# Patient Record
Sex: Male | Born: 1955 | Race: White | Hispanic: No | Marital: Married | Smoking: Former smoker
Health system: Southern US, Community
[De-identification: ages and names within clinical notes are randomized; demographics above are authoritative.]

## PROBLEM LIST (undated history)

## (undated) DIAGNOSIS — R51 Headache: Secondary | ICD-10-CM

## (undated) DIAGNOSIS — M51369 Other intervertebral disc degeneration, lumbar region without mention of lumbar back pain or lower extremity pain: Secondary | ICD-10-CM

## (undated) DIAGNOSIS — I700 Atherosclerosis of aorta: Secondary | ICD-10-CM

## (undated) DIAGNOSIS — G4733 Obstructive sleep apnea (adult) (pediatric): Secondary | ICD-10-CM

## (undated) DIAGNOSIS — I10 Essential (primary) hypertension: Secondary | ICD-10-CM

## (undated) DIAGNOSIS — E785 Hyperlipidemia, unspecified: Secondary | ICD-10-CM

## (undated) DIAGNOSIS — I5189 Other ill-defined heart diseases: Secondary | ICD-10-CM

## (undated) DIAGNOSIS — K760 Fatty (change of) liver, not elsewhere classified: Secondary | ICD-10-CM

## (undated) DIAGNOSIS — I358 Other nonrheumatic aortic valve disorders: Secondary | ICD-10-CM

## (undated) DIAGNOSIS — K219 Gastro-esophageal reflux disease without esophagitis: Secondary | ICD-10-CM

## (undated) DIAGNOSIS — I2510 Atherosclerotic heart disease of native coronary artery without angina pectoris: Secondary | ICD-10-CM

## (undated) DIAGNOSIS — M5030 Other cervical disc degeneration, unspecified cervical region: Secondary | ICD-10-CM

## (undated) DIAGNOSIS — G4730 Sleep apnea, unspecified: Secondary | ICD-10-CM

## (undated) DIAGNOSIS — K52831 Collagenous colitis: Secondary | ICD-10-CM

## (undated) DIAGNOSIS — Z972 Presence of dental prosthetic device (complete) (partial): Secondary | ICD-10-CM

## (undated) DIAGNOSIS — I219 Acute myocardial infarction, unspecified: Secondary | ICD-10-CM

## (undated) DIAGNOSIS — I639 Cerebral infarction, unspecified: Secondary | ICD-10-CM

## (undated) DIAGNOSIS — E162 Hypoglycemia, unspecified: Secondary | ICD-10-CM

## (undated) DIAGNOSIS — G43909 Migraine, unspecified, not intractable, without status migrainosus: Secondary | ICD-10-CM

## (undated) DIAGNOSIS — G8929 Other chronic pain: Secondary | ICD-10-CM

## (undated) DIAGNOSIS — T7840XA Allergy, unspecified, initial encounter: Secondary | ICD-10-CM

## (undated) DIAGNOSIS — R569 Unspecified convulsions: Secondary | ICD-10-CM

## (undated) DIAGNOSIS — C801 Malignant (primary) neoplasm, unspecified: Secondary | ICD-10-CM

## (undated) DIAGNOSIS — J45909 Unspecified asthma, uncomplicated: Secondary | ICD-10-CM

## (undated) DIAGNOSIS — A0472 Enterocolitis due to Clostridium difficile, not specified as recurrent: Secondary | ICD-10-CM

## (undated) DIAGNOSIS — M5136 Other intervertebral disc degeneration, lumbar region: Secondary | ICD-10-CM

## (undated) DIAGNOSIS — E119 Type 2 diabetes mellitus without complications: Secondary | ICD-10-CM

## (undated) DIAGNOSIS — R519 Headache, unspecified: Secondary | ICD-10-CM

## (undated) HISTORY — PX: OTHER SURGICAL HISTORY: SHX169

## (undated) HISTORY — DX: Acute myocardial infarction, unspecified: I21.9

## (undated) HISTORY — DX: Other cervical disc degeneration, unspecified cervical region: M50.30

## (undated) HISTORY — DX: Essential (primary) hypertension: I10

## (undated) HISTORY — PX: TOE SURGERY: SHX1073

## (undated) HISTORY — DX: Hyperlipidemia, unspecified: E78.5

## (undated) HISTORY — DX: Allergy, unspecified, initial encounter: T78.40XA

## (undated) HISTORY — PX: NECK SURGERY: SHX720

## (undated) HISTORY — DX: Other intervertebral disc degeneration, lumbar region without mention of lumbar back pain or lower extremity pain: M51.369

## (undated) HISTORY — DX: Other chronic pain: G89.29

## (undated) HISTORY — DX: Hypoglycemia, unspecified: E16.2

## (undated) HISTORY — PX: FINGER SURGERY: SHX640

## (undated) HISTORY — PX: APPENDECTOMY: SHX54

## (undated) HISTORY — DX: Type 2 diabetes mellitus without complications: E11.9

## (undated) HISTORY — DX: Other intervertebral disc degeneration, lumbar region: M51.36

## (undated) HISTORY — DX: Other nonrheumatic aortic valve disorders: I35.8

## (undated) HISTORY — PX: KNEE SURGERY: SHX244

## (undated) HISTORY — DX: Enterocolitis due to Clostridium difficile, not specified as recurrent: A04.72

## (undated) HISTORY — PX: CERVICAL FUSION: SHX112

## (undated) HISTORY — PX: BACK SURGERY: SHX140

## (undated) HISTORY — PX: CARDIAC CATHETERIZATION: SHX172

---

## 1998-12-16 ENCOUNTER — Encounter: Payer: Self-pay | Admitting: Neurosurgery

## 1998-12-19 ENCOUNTER — Encounter: Payer: Self-pay | Admitting: Neurosurgery

## 1998-12-19 ENCOUNTER — Observation Stay (HOSPITAL_COMMUNITY): Admission: RE | Admit: 1998-12-19 | Discharge: 1998-12-19 | Payer: Self-pay | Admitting: Neurosurgery

## 1999-01-01 ENCOUNTER — Encounter: Payer: Self-pay | Admitting: Neurosurgery

## 1999-01-01 ENCOUNTER — Ambulatory Visit (HOSPITAL_COMMUNITY): Admission: RE | Admit: 1999-01-01 | Discharge: 1999-01-01 | Payer: Self-pay | Admitting: Neurosurgery

## 1999-01-30 ENCOUNTER — Encounter: Payer: Self-pay | Admitting: Neurosurgery

## 1999-01-30 ENCOUNTER — Ambulatory Visit (HOSPITAL_COMMUNITY): Admission: RE | Admit: 1999-01-30 | Discharge: 1999-01-30 | Payer: Self-pay | Admitting: Neurosurgery

## 1999-02-03 ENCOUNTER — Encounter: Admission: RE | Admit: 1999-02-03 | Discharge: 1999-05-04 | Payer: Self-pay | Admitting: Anesthesiology

## 1999-04-07 ENCOUNTER — Encounter: Payer: Self-pay | Admitting: Neurosurgery

## 1999-04-07 ENCOUNTER — Ambulatory Visit (HOSPITAL_COMMUNITY): Admission: RE | Admit: 1999-04-07 | Discharge: 1999-04-07 | Payer: Self-pay | Admitting: Neurosurgery

## 1999-05-15 ENCOUNTER — Inpatient Hospital Stay (HOSPITAL_COMMUNITY): Admission: RE | Admit: 1999-05-15 | Discharge: 1999-05-18 | Payer: Self-pay | Admitting: Neurosurgery

## 1999-05-15 ENCOUNTER — Encounter: Payer: Self-pay | Admitting: Neurosurgery

## 1999-06-16 ENCOUNTER — Ambulatory Visit (HOSPITAL_COMMUNITY): Admission: RE | Admit: 1999-06-16 | Discharge: 1999-06-16 | Payer: Self-pay | Admitting: Neurosurgery

## 1999-06-16 ENCOUNTER — Encounter: Payer: Self-pay | Admitting: Neurosurgery

## 1999-09-04 ENCOUNTER — Encounter: Admission: RE | Admit: 1999-09-04 | Discharge: 1999-12-03 | Payer: Self-pay | Admitting: Anesthesiology

## 1999-09-04 ENCOUNTER — Encounter: Payer: Self-pay | Admitting: Anesthesiology

## 2005-06-04 ENCOUNTER — Other Ambulatory Visit: Payer: Self-pay

## 2005-06-04 ENCOUNTER — Inpatient Hospital Stay: Payer: Self-pay

## 2006-04-13 ENCOUNTER — Emergency Department: Payer: Self-pay | Admitting: Unknown Physician Specialty

## 2006-09-05 ENCOUNTER — Emergency Department: Payer: Self-pay | Admitting: Emergency Medicine

## 2007-11-15 ENCOUNTER — Ambulatory Visit: Payer: Self-pay | Admitting: Specialist

## 2007-11-21 ENCOUNTER — Ambulatory Visit: Payer: Self-pay | Admitting: Specialist

## 2009-12-19 ENCOUNTER — Emergency Department: Payer: Self-pay | Admitting: Emergency Medicine

## 2010-10-17 ENCOUNTER — Ambulatory Visit: Payer: Self-pay | Admitting: Family Medicine

## 2012-08-01 ENCOUNTER — Ambulatory Visit: Payer: Self-pay | Admitting: Unknown Physician Specialty

## 2012-08-04 LAB — PATHOLOGY REPORT

## 2013-06-28 ENCOUNTER — Ambulatory Visit: Payer: Self-pay | Admitting: Family Medicine

## 2013-07-20 ENCOUNTER — Ambulatory Visit: Payer: Self-pay | Admitting: Pain Medicine

## 2013-10-01 ENCOUNTER — Emergency Department: Payer: Self-pay | Admitting: Emergency Medicine

## 2013-10-01 LAB — CBC
HCT: 42.2 % (ref 40.0–52.0)
HGB: 13.7 g/dL (ref 13.0–18.0)
MCH: 29.2 pg (ref 26.0–34.0)
MCHC: 32.5 g/dL (ref 32.0–36.0)
MCV: 90 fL (ref 80–100)
Platelet: 179 10*3/uL (ref 150–440)
RBC: 4.7 10*6/uL (ref 4.40–5.90)
RDW: 13.9 % (ref 11.5–14.5)
WBC: 6.8 10*3/uL (ref 3.8–10.6)

## 2013-10-01 LAB — COMPREHENSIVE METABOLIC PANEL
Albumin: 3.7 g/dL (ref 3.4–5.0)
Alkaline Phosphatase: 63 U/L
Anion Gap: 3 — ABNORMAL LOW (ref 7–16)
BUN: 12 mg/dL (ref 7–18)
Bilirubin,Total: 0.7 mg/dL (ref 0.2–1.0)
Calcium, Total: 9.5 mg/dL (ref 8.5–10.1)
Chloride: 108 mmol/L — ABNORMAL HIGH (ref 98–107)
Co2: 28 mmol/L (ref 21–32)
Creatinine: 0.94 mg/dL (ref 0.60–1.30)
EGFR (African American): >60
EGFR (Non-African Amer.): >60
Glucose: 105 mg/dL — ABNORMAL HIGH (ref 65–99)
Osmolality: 278 (ref 275–301)
Potassium: 4 mmol/L (ref 3.5–5.1)
SGOT(AST): 17 U/L (ref 15–37)
SGPT (ALT): 21 U/L (ref 12–78)
Sodium: 139 mmol/L (ref 136–145)
Total Protein: 7.1 g/dL (ref 6.4–8.2)

## 2013-10-01 LAB — LIPASE, BLOOD: Lipase: 83 U/L (ref 73–393)

## 2013-10-13 ENCOUNTER — Emergency Department: Payer: Self-pay | Admitting: Emergency Medicine

## 2013-10-13 LAB — CBC WITH DIFFERENTIAL/PLATELET
Basophil #: 0.1 10*3/uL (ref 0.0–0.1)
Basophil %: 1.3 %
Eosinophil #: 0.2 10*3/uL (ref 0.0–0.7)
Eosinophil %: 1.7 %
HCT: 43.4 % (ref 40.0–52.0)
HGB: 14.7 g/dL (ref 13.0–18.0)
Lymphocyte #: 2.5 10*3/uL (ref 1.0–3.6)
Lymphocyte %: 26.9 %
MCH: 30.5 pg (ref 26.0–34.0)
MCHC: 33.8 g/dL (ref 32.0–36.0)
MCV: 90 fL (ref 80–100)
Monocyte #: 0.8 x10 3/mm (ref 0.2–1.0)
Monocyte %: 8.2 %
Neutrophil #: 5.8 10*3/uL (ref 1.4–6.5)
Neutrophil %: 61.9 %
Platelet: 172 10*3/uL (ref 150–440)
RBC: 4.82 10*6/uL (ref 4.40–5.90)
RDW: 13.9 % (ref 11.5–14.5)
WBC: 9.3 10*3/uL (ref 3.8–10.6)

## 2013-10-13 LAB — LIPASE, BLOOD: Lipase: 105 U/L (ref 73–393)

## 2013-10-13 LAB — URINALYSIS, COMPLETE
Bacteria: NONE SEEN
Bilirubin,UR: NEGATIVE
Blood: NEGATIVE
Glucose,UR: NEGATIVE mg/dL (ref 0–75)
Ketone: NEGATIVE
Leukocyte Esterase: NEGATIVE
Nitrite: NEGATIVE
Ph: 6 (ref 4.5–8.0)
Protein: NEGATIVE
RBC,UR: NONE SEEN /HPF (ref 0–5)
Specific Gravity: 1.003 (ref 1.003–1.030)
Squamous Epithelial: NONE SEEN
WBC UR: NONE SEEN /HPF (ref 0–5)

## 2013-10-13 LAB — COMPREHENSIVE METABOLIC PANEL
Albumin: 4 g/dL (ref 3.4–5.0)
Alkaline Phosphatase: 67 U/L
Anion Gap: 6 — ABNORMAL LOW (ref 7–16)
BUN: 13 mg/dL (ref 7–18)
Bilirubin,Total: 0.5 mg/dL (ref 0.2–1.0)
Calcium, Total: 9.2 mg/dL (ref 8.5–10.1)
Chloride: 108 mmol/L — ABNORMAL HIGH (ref 98–107)
Co2: 24 mmol/L (ref 21–32)
Creatinine: 0.94 mg/dL (ref 0.60–1.30)
EGFR (African American): >60
EGFR (Non-African Amer.): >60
Glucose: 121 mg/dL — ABNORMAL HIGH (ref 65–99)
Osmolality: 277 (ref 275–301)
Potassium: 3.8 mmol/L (ref 3.5–5.1)
SGOT(AST): 26 U/L (ref 15–37)
SGPT (ALT): 31 U/L (ref 12–78)
Sodium: 138 mmol/L (ref 136–145)
Total Protein: 7.7 g/dL (ref 6.4–8.2)

## 2013-10-13 LAB — CLOSTRIDIUM DIFFICILE(ARMC)

## 2013-10-18 LAB — STOOL CULTURE

## 2013-11-13 DIAGNOSIS — M4690 Unspecified inflammatory spondylopathy, site unspecified: Secondary | ICD-10-CM | POA: Insufficient documentation

## 2014-02-22 DIAGNOSIS — Z79899 Other long term (current) drug therapy: Secondary | ICD-10-CM | POA: Insufficient documentation

## 2014-02-28 ENCOUNTER — Emergency Department: Payer: Self-pay | Admitting: Emergency Medicine

## 2014-02-28 LAB — URINALYSIS, COMPLETE
Bacteria: NONE SEEN
Bilirubin,UR: NEGATIVE
Blood: NEGATIVE
Glucose,UR: NEGATIVE mg/dL (ref 0–75)
KETONE: NEGATIVE
LEUKOCYTE ESTERASE: NEGATIVE
NITRITE: NEGATIVE
Ph: 5 (ref 4.5–8.0)
Protein: NEGATIVE
RBC,UR: NONE SEEN /HPF (ref 0–5)
SPECIFIC GRAVITY: 1.013 (ref 1.003–1.030)
Squamous Epithelial: NONE SEEN
WBC UR: NONE SEEN /HPF (ref 0–5)

## 2014-02-28 LAB — COMPREHENSIVE METABOLIC PANEL
ALBUMIN: 3.5 g/dL (ref 3.4–5.0)
ANION GAP: 6 — AB (ref 7–16)
Alkaline Phosphatase: 62 U/L
BILIRUBIN TOTAL: 0.7 mg/dL (ref 0.2–1.0)
BUN: 13 mg/dL (ref 7–18)
CALCIUM: 8.9 mg/dL (ref 8.5–10.1)
CREATININE: 0.85 mg/dL (ref 0.60–1.30)
Chloride: 104 mmol/L (ref 98–107)
Co2: 26 mmol/L (ref 21–32)
Glucose: 105 mg/dL — ABNORMAL HIGH (ref 65–99)
Osmolality: 272 (ref 275–301)
Potassium: 4 mmol/L (ref 3.5–5.1)
SGOT(AST): 20 U/L (ref 15–37)
SGPT (ALT): 25 U/L (ref 12–78)
Sodium: 136 mmol/L (ref 136–145)
TOTAL PROTEIN: 7.6 g/dL (ref 6.4–8.2)

## 2014-02-28 LAB — CBC WITH DIFFERENTIAL/PLATELET
BASOS ABS: 0 10*3/uL (ref 0.0–0.1)
Basophil %: 0.5 %
EOS ABS: 0.1 10*3/uL (ref 0.0–0.7)
EOS PCT: 0.9 %
HCT: 42.9 % (ref 40.0–52.0)
HGB: 14.2 g/dL (ref 13.0–18.0)
LYMPHS ABS: 2.6 10*3/uL (ref 1.0–3.6)
LYMPHS PCT: 31.3 %
MCH: 30.4 pg (ref 26.0–34.0)
MCHC: 33 g/dL (ref 32.0–36.0)
MCV: 92 fL (ref 80–100)
Monocyte #: 0.9 x10 3/mm (ref 0.2–1.0)
Monocyte %: 11.4 %
NEUTROS ABS: 4.6 10*3/uL (ref 1.4–6.5)
Neutrophil %: 55.9 %
Platelet: 190 10*3/uL (ref 150–440)
RBC: 4.66 10*6/uL (ref 4.40–5.90)
RDW: 13.6 % (ref 11.5–14.5)
WBC: 8.3 10*3/uL (ref 3.8–10.6)

## 2014-02-28 LAB — LIPASE, BLOOD: Lipase: 152 U/L (ref 73–393)

## 2014-03-01 LAB — CLOSTRIDIUM DIFFICILE(ARMC)

## 2014-03-03 LAB — STOOL CULTURE

## 2014-10-23 DIAGNOSIS — I00 Rheumatic fever without heart involvement: Secondary | ICD-10-CM | POA: Diagnosis not present

## 2014-10-23 DIAGNOSIS — M5136 Other intervertebral disc degeneration, lumbar region: Secondary | ICD-10-CM | POA: Diagnosis not present

## 2014-10-23 DIAGNOSIS — G894 Chronic pain syndrome: Secondary | ICD-10-CM | POA: Diagnosis not present

## 2014-10-23 DIAGNOSIS — J449 Chronic obstructive pulmonary disease, unspecified: Secondary | ICD-10-CM | POA: Diagnosis not present

## 2014-10-23 DIAGNOSIS — E785 Hyperlipidemia, unspecified: Secondary | ICD-10-CM | POA: Diagnosis not present

## 2014-12-18 ENCOUNTER — Emergency Department: Payer: Self-pay | Admitting: Student

## 2014-12-18 DIAGNOSIS — K828 Other specified diseases of gallbladder: Secondary | ICD-10-CM | POA: Diagnosis not present

## 2014-12-18 DIAGNOSIS — K6389 Other specified diseases of intestine: Secondary | ICD-10-CM | POA: Diagnosis not present

## 2014-12-18 DIAGNOSIS — R1110 Vomiting, unspecified: Secondary | ICD-10-CM | POA: Diagnosis not present

## 2014-12-18 DIAGNOSIS — Z720 Tobacco use: Secondary | ICD-10-CM | POA: Diagnosis not present

## 2014-12-18 DIAGNOSIS — K760 Fatty (change of) liver, not elsewhere classified: Secondary | ICD-10-CM | POA: Diagnosis not present

## 2014-12-18 DIAGNOSIS — R1084 Generalized abdominal pain: Secondary | ICD-10-CM | POA: Diagnosis not present

## 2014-12-18 DIAGNOSIS — I10 Essential (primary) hypertension: Secondary | ICD-10-CM | POA: Diagnosis not present

## 2014-12-18 DIAGNOSIS — R197 Diarrhea, unspecified: Secondary | ICD-10-CM | POA: Diagnosis not present

## 2014-12-24 DIAGNOSIS — R197 Diarrhea, unspecified: Secondary | ICD-10-CM | POA: Diagnosis not present

## 2014-12-24 DIAGNOSIS — M549 Dorsalgia, unspecified: Secondary | ICD-10-CM

## 2014-12-24 DIAGNOSIS — K5289 Other specified noninfective gastroenteritis and colitis: Secondary | ICD-10-CM | POA: Diagnosis not present

## 2014-12-24 DIAGNOSIS — G8929 Other chronic pain: Secondary | ICD-10-CM | POA: Insufficient documentation

## 2014-12-24 DIAGNOSIS — R1012 Left upper quadrant pain: Secondary | ICD-10-CM | POA: Diagnosis not present

## 2014-12-25 ENCOUNTER — Other Ambulatory Visit: Payer: Self-pay | Admitting: Unknown Physician Specialty

## 2014-12-25 DIAGNOSIS — R197 Diarrhea, unspecified: Secondary | ICD-10-CM | POA: Diagnosis not present

## 2015-01-15 DIAGNOSIS — Z0000 Encounter for general adult medical examination without abnormal findings: Secondary | ICD-10-CM | POA: Diagnosis not present

## 2015-01-15 DIAGNOSIS — E785 Hyperlipidemia, unspecified: Secondary | ICD-10-CM | POA: Diagnosis not present

## 2015-01-15 DIAGNOSIS — M5136 Other intervertebral disc degeneration, lumbar region: Secondary | ICD-10-CM | POA: Diagnosis not present

## 2015-01-15 DIAGNOSIS — G894 Chronic pain syndrome: Secondary | ICD-10-CM | POA: Diagnosis not present

## 2015-01-15 DIAGNOSIS — I10 Essential (primary) hypertension: Secondary | ICD-10-CM | POA: Diagnosis not present

## 2015-01-24 DIAGNOSIS — K5289 Other specified noninfective gastroenteritis and colitis: Secondary | ICD-10-CM | POA: Diagnosis not present

## 2015-01-24 DIAGNOSIS — R197 Diarrhea, unspecified: Secondary | ICD-10-CM | POA: Diagnosis not present

## 2015-01-24 DIAGNOSIS — K868 Other specified diseases of pancreas: Secondary | ICD-10-CM | POA: Diagnosis not present

## 2015-01-24 DIAGNOSIS — K760 Fatty (change of) liver, not elsewhere classified: Secondary | ICD-10-CM | POA: Diagnosis not present

## 2015-01-28 DIAGNOSIS — K8689 Other specified diseases of pancreas: Secondary | ICD-10-CM | POA: Insufficient documentation

## 2015-02-12 DIAGNOSIS — Z23 Encounter for immunization: Secondary | ICD-10-CM | POA: Diagnosis not present

## 2015-03-12 DIAGNOSIS — Z23 Encounter for immunization: Secondary | ICD-10-CM | POA: Diagnosis not present

## 2015-03-20 DIAGNOSIS — C229 Malignant neoplasm of liver, not specified as primary or secondary: Secondary | ICD-10-CM

## 2015-03-20 DIAGNOSIS — C801 Malignant (primary) neoplasm, unspecified: Secondary | ICD-10-CM

## 2015-03-20 HISTORY — DX: Malignant (primary) neoplasm, unspecified: C80.1

## 2015-03-20 HISTORY — DX: Malignant neoplasm of liver, not specified as primary or secondary: C22.9

## 2015-03-27 ENCOUNTER — Ambulatory Visit: Payer: Medicare Other | Attending: Pain Medicine | Admitting: Pain Medicine

## 2015-03-27 ENCOUNTER — Ambulatory Visit: Payer: Self-pay | Admitting: Pain Medicine

## 2015-03-27 ENCOUNTER — Encounter (INDEPENDENT_AMBULATORY_CARE_PROVIDER_SITE_OTHER): Payer: Self-pay

## 2015-03-27 ENCOUNTER — Encounter: Payer: Self-pay | Admitting: Pain Medicine

## 2015-03-27 VITALS — BP 148/86 | HR 51 | Temp 97.6°F | Resp 16 | Ht 67.0 in | Wt 191.0 lb

## 2015-03-27 DIAGNOSIS — M545 Low back pain: Secondary | ICD-10-CM | POA: Diagnosis present

## 2015-03-27 DIAGNOSIS — M5481 Occipital neuralgia: Secondary | ICD-10-CM | POA: Diagnosis not present

## 2015-03-27 DIAGNOSIS — Z79899 Other long term (current) drug therapy: Secondary | ICD-10-CM | POA: Diagnosis not present

## 2015-03-27 DIAGNOSIS — Z9889 Other specified postprocedural states: Secondary | ICD-10-CM | POA: Diagnosis not present

## 2015-03-27 DIAGNOSIS — M47812 Spondylosis without myelopathy or radiculopathy, cervical region: Secondary | ICD-10-CM | POA: Insufficient documentation

## 2015-03-27 DIAGNOSIS — K769 Liver disease, unspecified: Secondary | ICD-10-CM | POA: Insufficient documentation

## 2015-03-27 DIAGNOSIS — Z981 Arthrodesis status: Secondary | ICD-10-CM

## 2015-03-27 DIAGNOSIS — M5030 Other cervical disc degeneration, unspecified cervical region: Secondary | ICD-10-CM | POA: Diagnosis not present

## 2015-03-27 DIAGNOSIS — M47817 Spondylosis without myelopathy or radiculopathy, lumbosacral region: Secondary | ICD-10-CM | POA: Diagnosis not present

## 2015-03-27 DIAGNOSIS — F1120 Opioid dependence, uncomplicated: Secondary | ICD-10-CM | POA: Diagnosis not present

## 2015-03-27 DIAGNOSIS — M5412 Radiculopathy, cervical region: Secondary | ICD-10-CM | POA: Diagnosis not present

## 2015-03-27 DIAGNOSIS — Z0389 Encounter for observation for other suspected diseases and conditions ruled out: Secondary | ICD-10-CM | POA: Diagnosis not present

## 2015-03-27 DIAGNOSIS — Z5181 Encounter for therapeutic drug level monitoring: Secondary | ICD-10-CM | POA: Diagnosis not present

## 2015-03-27 DIAGNOSIS — M5136 Other intervertebral disc degeneration, lumbar region: Secondary | ICD-10-CM | POA: Diagnosis not present

## 2015-03-27 DIAGNOSIS — M533 Sacrococcygeal disorders, not elsewhere classified: Secondary | ICD-10-CM | POA: Diagnosis not present

## 2015-03-27 DIAGNOSIS — M961 Postlaminectomy syndrome, not elsewhere classified: Secondary | ICD-10-CM | POA: Insufficient documentation

## 2015-03-27 DIAGNOSIS — M542 Cervicalgia: Secondary | ICD-10-CM | POA: Diagnosis present

## 2015-03-27 DIAGNOSIS — M5416 Radiculopathy, lumbar region: Secondary | ICD-10-CM | POA: Diagnosis not present

## 2015-03-27 DIAGNOSIS — M47816 Spondylosis without myelopathy or radiculopathy, lumbar region: Secondary | ICD-10-CM

## 2015-03-27 DIAGNOSIS — G8929 Other chronic pain: Secondary | ICD-10-CM | POA: Diagnosis not present

## 2015-03-27 DIAGNOSIS — M62838 Other muscle spasm: Secondary | ICD-10-CM | POA: Diagnosis not present

## 2015-03-27 NOTE — Progress Notes (Signed)
Subjective:    Patient ID: Edward Schneider., male    DOB: Jul 17, 1956, 59 y.o.   MRN: [id number]  HPI  Patient is 59 year old gentleman who comes to pain management Center at the request of Dr. Jeananne Rama for further evaluation and treatment of pain involving the lower back lower extremity region as well as the neck and upper extremity regions. The patient is status post prior surgical intervention of the cervical region as well as the lumbar region. Patient also has been informed that he has significant liver disease and is under the care of up Dr. Candace Cruise at this time. Patient has undergone prior evaluation and treatment at [facility] including evaluation at [facility]. she states that he developed blisters following the use of fentanyl patches and that he developed severe itching following the use of morphine. patient states that he is tolerated oxycodone acetaminophen very well his been cautioned regarding the acetaminophen and the percocet due to his hepatic condition. we informed patient that we wish to have patient evaluated by dr. Candace Cruise and that we would need approval from dr. Roxy Manns dr. Jeananne Rama to prescribe oxycodone for the patient and that we would eliminate the acetaminophen to minimize the effects of the liver. We also recommended that patient consider medications such as Neurontin which is metabolized in the kidney and that patient undergoes psych evaluation since medications with psychotropic effects may be beneficial in terms of reducing patient's pain as well as treating component of anxiety and depression which appears to be present the patient was understanding and will proceed with recommendations. Pending review of additional records we may consider prescribing medications for treatment of patient's condition and provided be happy permission to do such. The patient was informed of this and was in agreement with our decision.    Review of  Systems  Cardiovascular Unremarkable  Pulmonary Unremarkable  Neurological Mini strokes  Psychological Unremarkable  Gastrointestinal Unremarkable  Genitourinary Unremarkable  Hematological Unremarkable  Endocrine Unremarkable  Rheumatological Unremarkable  Musculoskeletal Unremarkable  Otherr significant history Unremarkable        Objective:   Physical Exam  There was tenderness to palpation of the splenius capitis Cipro talus region of mild to moderate degree. There was well-healed surgical scar the cervical region without increased warmth erythema in the region scar. Palpation of the trapezius levator scapula and rhomboid musculature region was a tends to palpation of moderate degree. There was unremarkable Spurling's maneuver. Palpation of the acromial clavicular glenohumeral joint regions reproduced mild discomfort. Outpatient of the thoracic facet thoracic paraspinal musculature region was a tends to palpation with no crepitus of the thoracic region noted. Palpation over the lumbar paraspinal musculature region lumbar facet region associated with moderate moderate severe discomfort with lateral bending and rotation reproducing moderate severe discomfort. There was severe tenderness to palpation of the PSIS and PII S regions. There was well-healed surgical scar the lumbar region without increased warmth or erythema region scar. DTRs were difficult to elicit patient had difficulty relaxing no definite sensory deficit of dermatomal detected. No severe tenderness to palpation of the abdomen was noted and no costovertebral angle tenderness was noted.      Assessment & Plan:   Degenerative disc disease cervical spine Post surgery cervical region Cervical facet syndrome  Degenerative disc disease lumbar spine Simona Huh post surgery of the lumbar region Lumbar facet syndrome  Sacroiliac joint dysfunction  Bilateral occipital neuralgia  Hepatic  disease   Plan  Continue present medications. As discussed we may consider prescribing Neurontin (gabapentin) and oxycodone pending approval by Dr. Candace Cruise and Dr. Jeananne Rama area we will also need permission to prescribed these medications as well.  F/U PCP for evaliation of  BP and general medical  condition..  F/U gastroenterological evaluation with Dr. Candace Cruise as scheduled this week  F/U surgical evaluation.  F/U neurological evaluation.  May consider radiofrequency rhizolysis or intraspinal procedures pending response to present treatment and F/U evaluation.  Patient to call [facility] Management [facility] should patient have concerns prior to scheduled return appointment.

## 2015-03-27 NOTE — Progress Notes (Signed)
Safety precautions to be maintained throughout the outpatient stay will include: orient to surroundings, keep bed in low position, maintain call bell within reach at all times, provide assistance with transfer out of bed and ambulation.  Patient discharge home, ambulatory at  1347hrs.

## 2015-03-27 NOTE — Patient Instructions (Signed)
Continue present medications. We may consider prescribing Neurontin (gabapentin) and oxycodone for your condition pending approval to prescribe these medications. We will need approval from Dr.Oh and Dr. Jeananne Rama to prescribe these medications for treatment of her condition  F/U PCP for evaliation of  BP and general medical  Condition.  Gastrointestinal and neurological evaluation with Dr. Candace Cruise  this week as planned. Please have Dr. Candace Cruise provide [place] with approval for you to take oxycodone for treatment of your pain involving your neck and upper extremities as well as back and lower extremities area we will need permission from Dr. Candace Cruise and Dr. Jeananne Rama to prescribe oxycodone for treatment of your pain and consideration of your hepatic condition  F/U surgical evaluation.  F/U neurological evaluation  Psych evaluation. We feel that psych evaluation will be beneficial in terms of treatment of your condition since several psych medications can help relieve pain as well as decrease any anxiety or depression which you may have related to your condition  May consider radiofrequency rhizolysis or intraspinal procedures pending response to present treatment and F/U evaluation.  Patient to call [facility] Management Center should patient have concerns prior to scheduled return appointment.

## 2015-03-28 DIAGNOSIS — K5289 Other specified noninfective gastroenteritis and colitis: Secondary | ICD-10-CM | POA: Diagnosis not present

## 2015-03-28 DIAGNOSIS — R1012 Left upper quadrant pain: Secondary | ICD-10-CM | POA: Diagnosis not present

## 2015-03-28 DIAGNOSIS — K868 Other specified diseases of pancreas: Secondary | ICD-10-CM | POA: Diagnosis not present

## 2015-03-28 DIAGNOSIS — K760 Fatty (change of) liver, not elsewhere classified: Secondary | ICD-10-CM | POA: Diagnosis not present

## 2015-04-02 ENCOUNTER — Other Ambulatory Visit: Payer: Self-pay | Admitting: Family Medicine

## 2015-04-04 ENCOUNTER — Other Ambulatory Visit: Payer: Self-pay | Admitting: Pain Medicine

## 2015-04-09 ENCOUNTER — Telehealth: Payer: Self-pay | Admitting: Family Medicine

## 2015-04-09 NOTE — Telephone Encounter (Signed)
Pt called, he is not getting pain meds from Pain Clinic until his next appt.  He would be due today for meds.  Practice Partner number [phone]

## 2015-04-10 MED ORDER — OXYCODONE-ACETAMINOPHEN 5-325 MG PO TABS: 1.0000 | ORAL_TABLET | Freq: Four times a day (QID) | ORAL | Status: DC | PRN

## 2015-04-10 NOTE — Addendum Note (Signed)
Addended byGolden Pop on: 04/10/2015 01:14 PM   Modules accepted: Orders

## 2015-04-16 ENCOUNTER — Ambulatory Visit: Payer: Medicare Other | Attending: Pain Medicine | Admitting: Pain Medicine

## 2015-04-16 ENCOUNTER — Telehealth: Payer: Self-pay | Admitting: Pain Medicine

## 2015-04-16 ENCOUNTER — Telehealth: Payer: Self-pay | Admitting: Family Medicine

## 2015-04-16 ENCOUNTER — Other Ambulatory Visit: Payer: Self-pay | Admitting: Pain Medicine

## 2015-04-16 ENCOUNTER — Encounter: Payer: Self-pay | Admitting: Pain Medicine

## 2015-04-16 VITALS — BP 132/79 | HR 51 | Temp 98.6°F | Resp 16 | Ht 68.0 in | Wt 193.0 lb

## 2015-04-16 DIAGNOSIS — M79605 Pain in left leg: Secondary | ICD-10-CM | POA: Diagnosis present

## 2015-04-16 DIAGNOSIS — Z981 Arthrodesis status: Secondary | ICD-10-CM

## 2015-04-16 DIAGNOSIS — M961 Postlaminectomy syndrome, not elsewhere classified: Secondary | ICD-10-CM

## 2015-04-16 DIAGNOSIS — M47817 Spondylosis without myelopathy or radiculopathy, lumbosacral region: Secondary | ICD-10-CM | POA: Diagnosis not present

## 2015-04-16 DIAGNOSIS — M533 Sacrococcygeal disorders, not elsewhere classified: Secondary | ICD-10-CM | POA: Insufficient documentation

## 2015-04-16 DIAGNOSIS — M5030 Other cervical disc degeneration, unspecified cervical region: Secondary | ICD-10-CM | POA: Insufficient documentation

## 2015-04-16 DIAGNOSIS — M5136 Other intervertebral disc degeneration, lumbar region: Secondary | ICD-10-CM | POA: Diagnosis not present

## 2015-04-16 DIAGNOSIS — M79604 Pain in right leg: Secondary | ICD-10-CM | POA: Diagnosis present

## 2015-04-16 DIAGNOSIS — M5416 Radiculopathy, lumbar region: Secondary | ICD-10-CM | POA: Diagnosis not present

## 2015-04-16 DIAGNOSIS — M62838 Other muscle spasm: Secondary | ICD-10-CM | POA: Diagnosis not present

## 2015-04-16 DIAGNOSIS — M47812 Spondylosis without myelopathy or radiculopathy, cervical region: Secondary | ICD-10-CM

## 2015-04-16 DIAGNOSIS — Z9889 Other specified postprocedural states: Secondary | ICD-10-CM | POA: Diagnosis not present

## 2015-04-16 DIAGNOSIS — M545 Low back pain: Secondary | ICD-10-CM | POA: Diagnosis present

## 2015-04-16 DIAGNOSIS — M5481 Occipital neuralgia: Secondary | ICD-10-CM

## 2015-04-16 DIAGNOSIS — M47816 Spondylosis without myelopathy or radiculopathy, lumbar region: Secondary | ICD-10-CM

## 2015-04-16 DIAGNOSIS — M5412 Radiculopathy, cervical region: Secondary | ICD-10-CM | POA: Diagnosis not present

## 2015-04-16 MED ORDER — GABAPENTIN 300 MG PO CAPS: ORAL_CAPSULE | ORAL | Status: DC

## 2015-04-16 MED ORDER — TOPIRAMATE 25 MG PO TABS: ORAL_TABLET | ORAL | Status: DC

## 2015-04-16 MED ORDER — METANX 3-35-2 MG PO TABS: ORAL_TABLET | ORAL | Status: DC

## 2015-04-16 NOTE — Telephone Encounter (Signed)
Pt called in says pain clinic gave the ok to complete the refill and says that if we need to verify call [phone]

## 2015-04-16 NOTE — Progress Notes (Signed)
Safety precautions to be maintained throughout the outpatient stay will include: orient to surroundings, keep bed in low position, maintain call bell within reach at all times, provide assistance with transfer out of bed and ambulation.  

## 2015-04-16 NOTE — Patient Instructions (Addendum)
Continue present medications and begin Metanx as discussed  Lumbar facet, medial branch nerve, blocks to be performed at time of return appointment 05/01/2015  F/U PCP, Dr. Jeananne Rama, for evaliation of  BP, hepatic condition, and general medical  Condition.  Psych evaluation. Please obtain date for psych evaluation as discussed  F/U surgical evaluation.  F/U neurological evaluation.  F/U Dr. Candace Cruise as discussed  May consider radiofrequency rhizolysis or intraspinal procedures pending response to present treatment and F/U evaluation.  Patient to call [facility] Management [facility] should patient have concerns prior to scheduled return appointment. Facet Blocks Patient Information  Description: The facets are joints in the spine between the vertebrae.  Like any joints in the body, facets can become irritated and painful.  Arthritis can also effect the facets.  By injecting steroids and local anesthetic in and around these joints, we can temporarily block the nerve supply to them.  Steroids act directly on irritated nerves and tissues to reduce selling and inflammation which often leads to decreased pain.  Facet blocks may be done anywhere along the spine from the neck to the low back depending upon the location of your pain.   After numbing the skin with local anesthetic (like Novocaine), a small needle is passed onto the facet joints under x-ray guidance.  You may experience a sensation of pressure while this is being done.  The entire block usually lasts about 15-25 minutes.   Conditions which may be treated by facet blocks:   Low back/buttock pain  Neck/shoulder pain  Certain types of headaches  Preparation for the injection:  1. Do not eat any solid food or dairy products within 6 hours of your appointment. 2. You may drink clear liquid up to 2 hours before appointment.  Clear liquids include water, black coffee, juice or soda.  No milk or cream please. 3. You may take your regular  medication, including pain medications, with a sip of water before your appointment.  Diabetics should hold regular insulin (if taken separately) and take 1/2 normal NPH dose the morning of the procedure.  Carry some sugar containing items with you to your appointment. 4. A driver must accompany you and be prepared to drive you home after your procedure. 5. Bring all your current medications with you. 6. An IV may be inserted and sedation may be given at the discretion of the physician. 7. A blood pressure cuff, EKG and other monitors will often be applied during the procedure.  Some patients may need to have extra oxygen administered for a short period. 8. You will be asked to provide medical information, including your allergies and medications, prior to the procedure.  We must know immediately if you are taking blood thinners (like Coumadin/Warfarin) or if you are allergic to IV iodine contrast (dye).  We must know if you could possible be pregnant.  Possible side-effects:   Bleeding from needle site  Infection (rare, may require surgery)  Nerve injury (rare)  Numbness & tingling (temporary)  Difficulty urinating (rare, temporary)  Spinal headache (a headache worse with upright posture)  Light-headedness (temporary)  Pain at injection site (serveral days)  Decreased blood pressure (rare, temporary)  Weakness in arm/leg (temporary)  Pressure sensation in back/neck (temporary)   Call if you experience:   Fever/chills associated with headache or increased back/neck pain  Headache worsened by an upright position  New onset, weakness or numbness of an extremity below the injection site  Hives or difficulty breathing (go to the emergency room)  Inflammation or drainage at the injection site(s)  Severe back/neck pain greater than usual  New symptoms which are concerning to you  Please note:  Although the local anesthetic injected can often make your back or neck feel  good for several hours after the injection, the pain will likely return. It takes 3-7 days for steroids to work.  You may not notice any pain relief for at least one week.  If effective, we will often do a series of 2-3 injections spaced 3-6 weeks apart to maximally decrease your pain.  After the initial series, you may be a candidate for a more permanent nerve block of the facets.  If you have any questions, please call #[phone]) [facility]

## 2015-04-16 NOTE — Telephone Encounter (Signed)
Ins will not pay metanx can you prescribe something different

## 2015-04-16 NOTE — Progress Notes (Signed)
   Subjective:    Patient ID: Edward Schneider., male    DOB: December 26, 1955, 59 y.o.   MRN: [id number]  HPI  Patient is 59 year old gentleman who returns to [facility] for further evaluation and treatment of pain involving the lower back and lower extremity regions predominantly. Patient is with history of prior surgical intervention of the lumbar region as well as cervical region. Patient is undergone prior evaluation and treatment at pain clinic at [facility] and [facility]. On today's visit we discussed patient's condition and due to patient's hepatic condition we will avoid certain medications as well as other treatment. We discussed patient's condition patient admits to burning stinging pain of the lower extremities. We will prescribe Metanx at this time. Patient is unable to tolerate Neurontin due to lower extremity swelling as a side effect of the Neurontin. We will consider other medications and will also schedule patient for psych evaluation for treatment of anxiety and depression. We feel that the psych medications will also benefit patient in terms of reducing his pain. We will proceed with interventional treatment consisting of lumbar facet, medial branch nerve, blocks at time return appointment in attempt to decrease severity of patient's symptoms, minimize progression of symptoms, and hopefully avoid the need for more involved treatment. The patient was understanding and in agreement with suggested treatment plan. The patient will follow-up with Dr. Jeananne Rama and Dr. Candace Cruise as planned     Review of Systems     Objective:   Physical Exam  There was mild tenderness of the splenius capitis and occipitalis musculature regions. There was well-healed scar of the cervical region . There was unremarkable Spurling's maneuver. Palpation of the cervical paraspinal musculature region and thoracic paraspinal musculature regions were with tends to palpation of mild degree.  Patient appeared to be with slightly decreased grip strength. Tinel and Phalen's maneuver were without increase of pain of significant degree. Palpation of the thoracic facet thoracic paraspinal musculature regions reproduced pain of moderate degree. No crepitus of the thoracic region was noted. Palpation of the lumbar paraspinal musculature region lumbar facet region was with tenderness to palpation and there was well-healed surgical scar of the lumbar region . There was severe tenderness to palpation of the lumbar facet lumbar paraspinal musculature region with rotation and palpation of the lumbar facets reproducing severe pain. Palpation of the PSIS and PII S region was associated with moderate discomfort and there was mild tenderness of the greater trochanteric region. Straight leg raising limited to approximately 20 without an increase of pain with dorsiflexion noted. Abdomen was nontender and no costovertebral angle tenderness was noted.     Assessment & Plan:  DDD lumbar Status post lumbar surgery  Lumbar facet syndrome  Sacroiliac joint dysfunction  DDD cervical Status post cervical surgery    Plan   Continue present medications and begin Metanx  . Patient unable to tolerate Neurontin   Lumbar facet, medial branch nerve, blocks to be performed at time return appointment  F/U PCP Dr. Jeananne Rama for evaliation of  BP and general medical  condition.  F/U surgical evaluation.  F/U neurological evaluation.  F/U Dr.Oh as planned  May consider radiofrequency rhizolysis or intraspinal procedures pending response to present treatment and F/U evaluation.  Patient to call [facility] should patient have concerns prior to scheduled return appointment.

## 2015-04-17 NOTE — Telephone Encounter (Signed)
Topamax was e-scribed. Patient notified by voice mail on 04-16-15

## 2015-04-19 ENCOUNTER — Telehealth: Payer: Self-pay | Admitting: Family Medicine

## 2015-04-19 NOTE — Telephone Encounter (Signed)
Unsure of what to do.  Can you please look into this request.?

## 2015-04-19 NOTE — Telephone Encounter (Signed)
Pt called stated he needs another RX for percocet. Stated he only received an RX for 64 pills and usually gets 112. Pt stated he will be out of the medication before Dr. Jeananne Rama returns to the clinic. Pt stated said the RX he received will only last him through 04/26/15. Pt stated Malachy Mood has filled it before, wants to know if someone else can write the RX. Pt stated all we need to do is contact the pain clinic Dr. Primus Bravo @ [phone]. Please contact on his cell phone with any questions or concerns.

## 2015-04-22 ENCOUNTER — Emergency Department: Payer: Medicare Other

## 2015-04-22 ENCOUNTER — Encounter: Payer: Self-pay | Admitting: *Deleted

## 2015-04-22 ENCOUNTER — Emergency Department
Admission: EM | Admit: 2015-04-22 | Discharge: 2015-04-23 | Disposition: A | Discharge status: Home / Self Care | Payer: Medicare Other | Attending: Emergency Medicine | Admitting: Emergency Medicine

## 2015-04-22 ENCOUNTER — Other Ambulatory Visit: Payer: Self-pay

## 2015-04-22 DIAGNOSIS — Z720 Tobacco use: Secondary | ICD-10-CM | POA: Insufficient documentation

## 2015-04-22 DIAGNOSIS — R109 Unspecified abdominal pain: Secondary | ICD-10-CM

## 2015-04-22 DIAGNOSIS — Z79899 Other long term (current) drug therapy: Secondary | ICD-10-CM | POA: Insufficient documentation

## 2015-04-22 DIAGNOSIS — F419 Anxiety disorder, unspecified: Secondary | ICD-10-CM | POA: Diagnosis not present

## 2015-04-22 DIAGNOSIS — R197 Diarrhea, unspecified: Secondary | ICD-10-CM | POA: Diagnosis not present

## 2015-04-22 DIAGNOSIS — R1032 Left lower quadrant pain: Secondary | ICD-10-CM | POA: Diagnosis not present

## 2015-04-22 DIAGNOSIS — R1110 Vomiting, unspecified: Secondary | ICD-10-CM | POA: Diagnosis not present

## 2015-04-22 DIAGNOSIS — G8929 Other chronic pain: Secondary | ICD-10-CM | POA: Insufficient documentation

## 2015-04-22 DIAGNOSIS — R1084 Generalized abdominal pain: Secondary | ICD-10-CM

## 2015-04-22 DIAGNOSIS — C229 Malignant neoplasm of liver, not specified as primary or secondary: Secondary | ICD-10-CM | POA: Diagnosis not present

## 2015-04-22 HISTORY — DX: Unspecified asthma, uncomplicated: J45.909

## 2015-04-22 HISTORY — DX: Malignant (primary) neoplasm, unspecified: C80.1

## 2015-04-22 LAB — CBC WITH DIFFERENTIAL/PLATELET
BASOS PCT: 1 %
Basophils Absolute: 0.1 10*3/uL (ref 0–0.1)
EOS ABS: 0.3 10*3/uL (ref 0–0.7)
EOS PCT: 3 %
HCT: 40.8 % (ref 40.0–52.0)
Hemoglobin: 13.7 g/dL (ref 13.0–18.0)
Lymphocytes Relative: 44 %
Lymphs Abs: 3.6 10*3/uL (ref 1.0–3.6)
MCH: 30.7 pg (ref 26.0–34.0)
MCHC: 33.7 g/dL (ref 32.0–36.0)
MCV: 91 fL (ref 80.0–100.0)
MONOS PCT: 9 %
Monocytes Absolute: 0.8 10*3/uL (ref 0.2–1.0)
NEUTROS ABS: 3.6 10*3/uL (ref 1.4–6.5)
NEUTROS PCT: 43 %
Platelets: 167 10*3/uL (ref 150–440)
RBC: 4.48 MIL/uL (ref 4.40–5.90)
RDW: 13.6 % (ref 11.5–14.5)
WBC: 8.3 10*3/uL (ref 3.8–10.6)

## 2015-04-22 LAB — URINALYSIS COMPLETE WITH MICROSCOPIC (ARMC ONLY)
BILIRUBIN URINE: NEGATIVE
Bacteria, UA: NONE SEEN
Glucose, UA: NEGATIVE mg/dL
HGB URINE DIPSTICK: NEGATIVE
KETONES UR: NEGATIVE mg/dL
Leukocytes, UA: NEGATIVE
Nitrite: NEGATIVE
PROTEIN: NEGATIVE mg/dL
Specific Gravity, Urine: 1.008 (ref 1.005–1.030)
Squamous Epithelial / LPF: NONE SEEN
pH: 7 (ref 5.0–8.0)

## 2015-04-22 LAB — COMPREHENSIVE METABOLIC PANEL
ALT: 20 U/L (ref 17–63)
AST: 25 U/L (ref 15–41)
Albumin: 4.1 g/dL (ref 3.5–5.0)
Alkaline Phosphatase: 63 U/L (ref 38–126)
Anion gap: 10 (ref 5–15)
BUN: 11 mg/dL (ref 6–20)
CALCIUM: 9.6 mg/dL (ref 8.9–10.3)
CO2: 27 mmol/L (ref 22–32)
Chloride: 103 mmol/L (ref 101–111)
Creatinine, Ser: 0.81 mg/dL (ref 0.61–1.24)
GLUCOSE: 120 mg/dL — AB (ref 65–99)
Potassium: 4 mmol/L (ref 3.5–5.1)
SODIUM: 140 mmol/L (ref 135–145)
Total Bilirubin: 0.5 mg/dL (ref 0.3–1.2)
Total Protein: 7 g/dL (ref 6.5–8.1)

## 2015-04-22 LAB — LIPASE, BLOOD: Lipase: 35 U/L (ref 22–51)

## 2015-04-22 LAB — TROPONIN I: Troponin I: <0.03 ng/mL (ref <0.031)

## 2015-04-22 MED ORDER — HYDROMORPHONE HCL 1 MG/ML IJ SOLN
INTRAMUSCULAR | Status: AC
  Administered 2015-04-22: 1 mg via INTRAVENOUS
  Filled 2015-04-22: qty 1

## 2015-04-22 MED ORDER — HYDROMORPHONE HCL 1 MG/ML IJ SOLN
1.0000 mg | Freq: Once | INTRAMUSCULAR | Status: AC
  Administered 2015-04-22: 1 mg via INTRAVENOUS

## 2015-04-22 MED ORDER — ONDANSETRON HCL 4 MG/2ML IJ SOLN
INTRAMUSCULAR | Status: AC
  Administered 2015-04-22: 4 mg via INTRAVENOUS
  Filled 2015-04-22: qty 2

## 2015-04-22 MED ORDER — SODIUM CHLORIDE 0.9 % IV BOLUS (SEPSIS)
1000.0000 mL | Freq: Once | INTRAVENOUS | Status: AC
  Administered 2015-04-22: 1000 mL via INTRAVENOUS

## 2015-04-22 MED ORDER — IOHEXOL 350 MG/ML SOLN
100.0000 mL | Freq: Once | INTRAVENOUS | Status: AC | PRN
  Administered 2015-04-22: 100 mL via INTRAVENOUS

## 2015-04-22 MED ORDER — ONDANSETRON HCL 4 MG/2ML IJ SOLN
4.0000 mg | Freq: Once | INTRAMUSCULAR | Status: AC
  Administered 2015-04-22: 4 mg via INTRAVENOUS

## 2015-04-22 NOTE — ED Notes (Signed)
Pt to triage via wheelchair with eyes closed.  Pt has abd pain for 3 years.  States pain is worse since this morning.  Pt has vomiting x3.  Diarrhea x 5 today.  Pt states recent dx of stomach cancer.

## 2015-04-22 NOTE — ED Provider Notes (Signed)
[facility] Emergency Department Provider Note  ____________________________________________  Time seen: 10:00 PM  I have reviewed the triage vital signs and the nursing notes.   HISTORY  Chief Complaint Abdominal Pain    HPI Edward Schneider. is a 59 y.o. male who complains of severe abdominal pain for one week. It is particularly bad today and associated with vomiting and diarrhea today. He reports is constant and always there although it does get severely worse whenever he eats. Pain is nonradiating. No chest pain or shortness of breath. No fever or chills. No blood in diarrhea or emesis     Past Medical History  Diagnosis Date  . Liver disease   . C. difficile diarrhea   . Asthma   . Cancer June 2016    liver cancer    Patient Active Problem List   Diagnosis Date Noted  . DDD (degenerative disc disease), cervical 03/27/2015  . Status post cervical spinal fusion 03/27/2015  . DDD (degenerative disc disease), lumbar 03/27/2015  . Cervical post-laminectomy syndrome 03/27/2015  . Cervical facet syndrome 03/27/2015  . Facet syndrome, lumbar 03/27/2015  . Sacroiliac joint dysfunction 03/27/2015  . Bilateral occipital neuralgia 03/27/2015    Past Surgical History  Procedure Laterality Date  . Neck surgery    . Back surgery    . Appendectomy    . Spleen surg    . Spleen surgery    . Knee surgery Right     Current Outpatient Rx  Name  Route  Sig  Dispense  Refill  . oxyCODONE-acetaminophen (ROXICET) 5-325 MG per tablet   Oral   Take 1 tablet by mouth every 6 (six) hours as needed for severe pain.   64 tablet   0   . topiramate (TOPAMAX) 25 MG tablet      Limit one tablet by mouth per day or twice per day if tolerated   50 tablet   0   . vitamin A 7500 UNIT capsule   Oral   Take 2,000 Units by mouth daily.         . vitamin B-12 (CYANOCOBALAMIN) 100 MCG tablet   Oral   Take 100 mcg by mouth daily.         . vitamin C  (ASCORBIC ACID) 250 MG tablet   Oral   Take 250 mg by mouth daily.         Marland Kitchen EPINEPHrine (EPIPEN IJ)   Injection   Inject as directed as needed.         . gabapentin (NEURONTIN) 300 MG capsule      Limit 1 capsule by mouth for 3 days then increase to 1 capsule by mouth twice per day if tolerated   50 capsule   0   . multivitamin (METANX) 3-35-2 MG TABS tablet      Limit 1-2 tabs by mouth per day if tolerated   60 tablet   0     Allergies Shrimp; Fentanyl; and Morphine and related  Family History  Problem Relation Age of Onset  . Arthritis Mother   . Diabetes Mother   . Kidney disease Mother   . Arthritis Father   . Hearing loss Father   . Heart disease Father   . Hypertension Father     Social History History  Substance Use Topics  . Smoking status: Current Every Day Smoker -- 1.00 packs/day    Types: Cigarettes  . Smokeless tobacco: Not on file  . Alcohol Use:  No    Review of Systems  Constitutional: No fever or chills. No weight changes Eyes:No blurry vision or double vision.  ENT: No sore throat. Cardiovascular: No chest pain. Respiratory: No dyspnea or cough. Gastrointestinal: Abdominal pain vomiting and diarrhea as above.  No BRBPR or melena. Genitourinary: Negative for dysuria, urinary retention, bloody urine, or difficulty urinating. Musculoskeletal: Negative for back pain. No joint swelling or pain. Skin: Negative for rash. Neurological: Negative for headaches, focal weakness or numbness. Psychiatric:Positive anxiety.   Endocrine:No hot/cold intolerance, changes in energy, or sleep difficulty.  10-point ROS otherwise negative.  ____________________________________________   PHYSICAL EXAM:  VITAL SIGNS: ED Triage Vitals  Enc Vitals Group     BP 04/22/15 2112 147/76 mmHg     Pulse Rate 04/22/15 2112 62     Resp 04/22/15 2112 20     Temp 04/22/15 2112 98.1 F (36.7 C)     Temp Source 04/22/15 2112 Oral     SpO2 04/22/15 2112 97 %      Weight 04/22/15 2112 191 lb (86.637 kg)     Height 04/22/15 2112 5\' 8"  (1.727 m)     Head Cir --      Peak Flow --      Pain Score 04/22/15 2113 10     Pain Loc --      Pain Edu? --      Excl. in [place]? --      Constitutional: Alert and oriented. Moderate distress, anxious. Eyes: No scleral icterus. No conjunctival pallor. PERRL. EOMI ENT   Head: Normocephalic and atraumatic.   Nose: No congestion/rhinnorhea. No septal hematoma   Mouth/Throat: MMM, no pharyngeal erythema. No peritonsillar mass. No uvula shift.   Neck: No stridor. No SubQ emphysema. No meningismus. Hematological/Lymphatic/Immunilogical: No cervical lymphadenopathy. Cardiovascular: RRR. Normal and symmetric distal pulses are present in all extremities. No murmurs, rubs, or gallops. Respiratory: Normal respiratory effort without tachypnea nor retractions. Breath sounds are clear and equal bilaterally. No wheezes/rales/rhonchi. Gastrointestinal: Soft with generalized tenderness. No distention. There is no CVA tenderness.  No rebound, rigidity, or guarding. Genitourinary: deferred Musculoskeletal: Nontender with normal range of motion in all extremities. No joint effusions.  No lower extremity tenderness.  No edema. Neurologic:   Normal speech and language.  CN 2-10 normal. Motor grossly intact. No pronator drift.  Normal gait. No gross focal neurologic deficits are appreciated.  Skin:  Skin is warm, dry and intact. No rash noted.  No petechiae, purpura, or bullae. Psychiatric: Mood and affect are normal. Speech and behavior are normal. Patient exhibits appropriate insight and judgment.  ____________________________________________    LABS (pertinent positives/negatives) (all labs ordered are listed, but only abnormal results are displayed) Labs Reviewed  COMPREHENSIVE METABOLIC PANEL - Abnormal; Notable for the following:    Glucose, Bld 120 (*)    All other components within normal limits   URINALYSIS COMPLETEWITH MICROSCOPIC ([facility] ONLY) - Abnormal; Notable for the following:    Color, Urine STRAW (*)    APPearance CLEAR (*)    All other components within normal limits  CBC WITH DIFFERENTIAL/PLATELET  LIPASE, BLOOD  TROPONIN I   ____________________________________________   EKG  Interpreted by me  Date: 04/22/2015  Rate: 57  Rhythm: normal sinus rhythm  QRS Axis: normal  Intervals: normal  ST/T Wave abnormalities: normal  Conduction Disutrbances: none  Narrative Interpretation: unremarkable      ____________________________________________    RADIOLOGY  CT angiography abdomen pending Ultrasound gallbladder pending  ____________________________________________   PROCEDURES  ____________________________________________   INITIAL IMPRESSION / ASSESSMENT AND PLAN / ED COURSE  Pertinent labs & imaging results that were available during my care of the patient were reviewed by me and considered in my medical decision making (see chart for details).  Patient presents with severe colicky abdominal pain after eating. This appears to be out of proportion to exam. We will proceed with a CT angiogram of the abdominal vasculature as well as an ultrasound of the gallbladder to look for cholecystitis. Patient is given IV fluids Zofran and Dilaudid for symptom relief at this time. Care of the patient is signed out to Dr. Owens Shark pending radiology studies.  ____________________________________________   FINAL CLINICAL IMPRESSION(S) / ED DIAGNOSES  Final diagnoses:  Abdominal pain, left lower quadrant  Vomiting and diarrhea      Carrie Mew, MD 04/22/15 2351

## 2015-04-22 NOTE — ED Notes (Signed)
Main lab, spoke to [facility], for ad on troponin.

## 2015-04-23 ENCOUNTER — Telehealth: Payer: Self-pay | Admitting: Pain Medicine

## 2015-04-23 MED ORDER — KETOROLAC TROMETHAMINE 10 MG PO TABS
ORAL_TABLET | ORAL | Status: AC
  Administered 2015-04-23: 10 mg via ORAL
  Filled 2015-04-23: qty 1

## 2015-04-23 MED ORDER — KETOROLAC TROMETHAMINE 10 MG PO TABS
10.0000 mg | ORAL_TABLET | Freq: Once | ORAL | Status: AC
  Administered 2015-04-23: 10 mg via ORAL

## 2015-04-23 NOTE — ED Provider Notes (Signed)
I assumed care of the patient at 11:00 PM Dr. Joni Fears. Patient's CAT scan of the abdomen and pelvis as well as ultrasound was read as no acute pathology noted by the radiologist. I evaluated the patient who informed me that he ran out of his Percocet's and was informed by Dr. Primus Bravo that he would not be given another prescription until one week from today. I informed the patient that he would need to call Dr. Primus Bravo office in the morning but that I would not write a additional narcotic discussion from the emergency department at this time.  Gregor Hams, MD 04/23/15 [phone]

## 2015-04-23 NOTE — Discharge Instructions (Signed)

## 2015-04-23 NOTE — Telephone Encounter (Signed)
Edward Schneider from Dr. Rance Muir office left vmail on Friday April 19, 2015 at 4:28 Dr Jeananne Rama is on vacation, they want to clarify that Dr. Primus Bravo wants them to fill medications Please call and ask for Edward Schneider or Edward Schneider

## 2015-04-25 DIAGNOSIS — T782XXS Anaphylactic shock, unspecified, sequela: Secondary | ICD-10-CM

## 2015-04-25 DIAGNOSIS — J449 Chronic obstructive pulmonary disease, unspecified: Secondary | ICD-10-CM | POA: Insufficient documentation

## 2015-04-25 DIAGNOSIS — I10 Essential (primary) hypertension: Secondary | ICD-10-CM | POA: Insufficient documentation

## 2015-04-25 DIAGNOSIS — T782XXA Anaphylactic shock, unspecified, initial encounter: Secondary | ICD-10-CM | POA: Insufficient documentation

## 2015-04-25 DIAGNOSIS — E1159 Type 2 diabetes mellitus with other circulatory complications: Secondary | ICD-10-CM | POA: Insufficient documentation

## 2015-04-25 DIAGNOSIS — J439 Emphysema, unspecified: Secondary | ICD-10-CM | POA: Insufficient documentation

## 2015-04-25 DIAGNOSIS — E785 Hyperlipidemia, unspecified: Secondary | ICD-10-CM

## 2015-04-25 MED ORDER — OXYCODONE-ACETAMINOPHEN 5-325 MG PO TABS: 1.0000 | ORAL_TABLET | Freq: Four times a day (QID) | ORAL | Status: DC | PRN

## 2015-04-25 NOTE — Telephone Encounter (Signed)
Pt called stated he runs out of his medication today. Wants to know if more can be called in. Pt stated Dr. Jeananne Rama only gave him 50 pills to last until he goes to the pain clinic. Please follow up with Dr. Wynetta Emery and pain clinic.

## 2015-04-25 NOTE — Telephone Encounter (Signed)
Tiffany from dr Rance Muir office called to check on status of msg, i informed her we did not give medications to patient and they would receive a request from dr crisp before he took over med, and would they cover patient until dr crisp did take over meds.

## 2015-04-25 NOTE — Telephone Encounter (Signed)
No returned phone call from the pain center. Per records patient went to ER on 04/22/15 and states that he ran out of his medication early. I am not comfortable doing this. Forwarded to [facility] for FYI.

## 2015-04-25 NOTE — Telephone Encounter (Signed)
Patient notified, he will pick it up tomorrow.

## 2015-04-25 NOTE — Telephone Encounter (Signed)
Called and spoke with Juliann Pulse at [facility] pain clinic. She states that Dr.Crisp does not take over patients pain medication prescripts for at the least the third visit. They wanted to know if we could give him 1-2 more refills until this is actually taken over by Dr.Crisp.

## 2015-04-25 NOTE — Telephone Encounter (Signed)
Will give him a refill. OK for him to come pick it up.

## 2015-04-26 NOTE — Telephone Encounter (Signed)
I agree with your response to Edward Schneider at Dr Rance Muir office.Request that Dr Jeananne Rama countnue to prescribe medications for now

## 2015-04-29 ENCOUNTER — Ambulatory Visit (INDEPENDENT_AMBULATORY_CARE_PROVIDER_SITE_OTHER): Payer: Medicare Other | Admitting: Family Medicine

## 2015-04-29 ENCOUNTER — Encounter: Payer: Self-pay | Admitting: Family Medicine

## 2015-04-29 VITALS — BP 126/76 | HR 51 | Temp 98.6°F | Ht 68.0 in | Wt 204.0 lb

## 2015-04-29 DIAGNOSIS — M5136 Other intervertebral disc degeneration, lumbar region: Secondary | ICD-10-CM | POA: Diagnosis not present

## 2015-04-29 DIAGNOSIS — M5030 Other cervical disc degeneration, unspecified cervical region: Secondary | ICD-10-CM | POA: Diagnosis not present

## 2015-04-29 DIAGNOSIS — I10 Essential (primary) hypertension: Secondary | ICD-10-CM

## 2015-04-29 MED ORDER — EPINEPHRINE 0.3 MG/0.3ML IJ SOAJ: 0.3000 mg | Freq: Once | INTRAMUSCULAR | Status: DC

## 2015-04-29 NOTE — Progress Notes (Signed)
BP 126/76 mmHg  Pulse 51  Temp(Src) 98.6 F (37 C)  Ht 5\' 8"  (1.727 m)  Wt 204 lb (92.534 kg)  BMI 31.03 kg/m2  SpO2 99%   Subjective:    Patient ID: Edward Schneider., male    DOB: 1956/06/09, 59 y.o.   MRN: [id number]  HPI: Edward Schneider. is a 59 y.o. male  No chief complaint on file. f/u pain care for back Dr Primus Bravo will be witting pain meds starting in Aug is current plan  Discussed no early refills Dr Wynetta Emery gav rx last week should be last Pt getting nerve block  Breathing trying to stop smoking down from 2 packs a day to 8cigs a day  Relevant past medical, surgical, family and social history reviewed and updated as indicated. Interim medical history since our last visit reviewed. Allergies and medications reviewed and updated.  Review of Systems  Constitutional: Positive for fatigue. Negative for fever.  Respiratory: Negative.   Cardiovascular: Negative.     Per HPI unless specifically indicated above     Objective:    BP 126/76 mmHg  Pulse 51  Temp(Src) 98.6 F (37 C)  Ht 5\' 8"  (1.727 m)  Wt 204 lb (92.534 kg)  BMI 31.03 kg/m2  SpO2 99%  Wt Readings from Last 3 Encounters:  04/29/15 204 lb (92.534 kg)  01/15/15 197 lb (89.359 kg)  04/22/15 191 lb (86.637 kg)    Physical Exam  Constitutional: He is oriented to person, place, and time. He appears well-developed and well-nourished. No distress.  HENT:  Head: Normocephalic and atraumatic.  Right Ear: Hearing normal.  Left Ear: Hearing normal.  Nose: Nose normal.  Eyes: Conjunctivae and lids are normal. Right eye exhibits no discharge. Left eye exhibits no discharge. No scleral icterus.  Cardiovascular: Normal rate and regular rhythm.   Pulmonary/Chest: Effort normal and breath sounds normal. No respiratory distress.  Musculoskeletal:  Limited back ROM  Neurological: He is alert and oriented to person, place, and time.  Skin: Skin is intact. No rash noted.  Psychiatric: He has a normal mood and  affect. His speech is normal and behavior is normal. Judgment and thought content normal. Cognition and memory are normal.    Results for orders placed or performed during the hospital encounter of 04/22/15  CBC WITH DIFFERENTIAL  Result Value Ref Range   WBC 8.3 3.8 - 10.6 K/uL   RBC 4.48 4.40 - 5.90 MIL/uL   Hemoglobin 13.7 13.0 - 18.0 g/dL   HCT 40.8 40.0 - 52.0 %   MCV 91.0 80.0 - 100.0 fL   MCH 30.7 26.0 - 34.0 pg   MCHC 33.7 32.0 - 36.0 g/dL   RDW 13.6 11.5 - 14.5 %   Platelets 167 150 - 440 K/uL   Neutrophils Relative % 43 %   Neutro Abs 3.6 1.4 - 6.5 K/uL   Lymphocytes Relative 44 %   Lymphs Abs 3.6 1.0 - 3.6 K/uL   Monocytes Relative 9 %   Monocytes Absolute 0.8 0.2 - 1.0 K/uL   Eosinophils Relative 3 %   Eosinophils Absolute 0.3 0 - 0.7 K/uL   Basophils Relative 1 %   Basophils Absolute 0.1 0 - 0.1 K/uL  Comprehensive metabolic panel  Result Value Ref Range   Sodium 140 135 - 145 mmol/L   Potassium 4.0 3.5 - 5.1 mmol/L   Chloride 103 101 - 111 mmol/L   CO2 27 22 - 32 mmol/L   Glucose, Bld 120 (  H) 65 - 99 mg/dL   BUN 11 6 - 20 mg/dL   Creatinine, Ser 0.81 0.61 - 1.24 mg/dL   Calcium 9.6 8.9 - 10.3 mg/dL   Total Protein 7.0 6.5 - 8.1 g/dL   Albumin 4.1 3.5 - 5.0 g/dL   AST 25 15 - 41 U/L   ALT 20 17 - 63 U/L   Alkaline Phosphatase 63 38 - 126 U/L   Total Bilirubin 0.5 0.3 - 1.2 mg/dL   GFR calc non Af Amer >60 >60 mL/min   GFR calc Af Amer >60 >60 mL/min   Anion gap 10 5 - 15  Lipase, blood  Result Value Ref Range   Lipase 35 22 - 51 U/L  Urinalysis complete, with microscopic ([facility] only)  Result Value Ref Range   Color, Urine STRAW (A) YELLOW   APPearance CLEAR (A) CLEAR   Glucose, UA NEGATIVE NEGATIVE mg/dL   Bilirubin Urine NEGATIVE NEGATIVE   Ketones, ur NEGATIVE NEGATIVE mg/dL   Specific Gravity, Urine 1.008 1.005 - 1.030   Hgb urine dipstick NEGATIVE NEGATIVE   pH 7.0 5.0 - 8.0   Protein, ur NEGATIVE NEGATIVE mg/dL   Nitrite NEGATIVE NEGATIVE    Leukocytes, UA NEGATIVE NEGATIVE   RBC / HPF 0-5 0 - 5 RBC/hpf   WBC, UA 0-5 0 - 5 WBC/hpf   Bacteria, UA NONE SEEN NONE SEEN   Squamous Epithelial / LPF NONE SEEN NONE SEEN  Troponin I  Result Value Ref Range   Troponin I <0.03 <0.031 ng/mL      Assessment & Plan:   Problem List Items Addressed This Visit      Cardiovascular and Mediastinum   Hypertension - Primary    The current medical regimen is effective;  continue present plan and medications.       Relevant Medications   EPINEPHrine (EPIPEN 2-PAK) 0.3 mg/0.3 mL IJ SOAJ injection     Musculoskeletal and Integument   DDD (degenerative disc disease), cervical    Starting pain clinic this mo      DDD (degenerative disc disease), lumbar    Starting pain clinic this mo          Follow up plan: Return in about 3 months (around 07/30/2015), or if symptoms worsen or fail to improve, for Physical Exam.

## 2015-04-29 NOTE — Assessment & Plan Note (Signed)
The current medical regimen is effective;  continue present plan and medications.  

## 2015-04-29 NOTE — Assessment & Plan Note (Signed)
Starting pain clinic this mo

## 2015-05-01 ENCOUNTER — Encounter: Payer: Self-pay | Admitting: Pain Medicine

## 2015-05-01 ENCOUNTER — Ambulatory Visit: Payer: Medicare Other | Attending: Pain Medicine | Admitting: Pain Medicine

## 2015-05-01 VITALS — BP 115/58 | HR 46 | Temp 98.1°F | Resp 16 | Ht 68.0 in | Wt 201.0 lb

## 2015-05-01 DIAGNOSIS — M5126 Other intervertebral disc displacement, lumbar region: Secondary | ICD-10-CM | POA: Diagnosis not present

## 2015-05-01 DIAGNOSIS — Z7982 Long term (current) use of aspirin: Secondary | ICD-10-CM

## 2015-05-01 DIAGNOSIS — M1288 Other specific arthropathies, not elsewhere classified, other specified site: Secondary | ICD-10-CM | POA: Insufficient documentation

## 2015-05-01 DIAGNOSIS — M47816 Spondylosis without myelopathy or radiculopathy, lumbar region: Secondary | ICD-10-CM | POA: Insufficient documentation

## 2015-05-01 DIAGNOSIS — M79604 Pain in right leg: Secondary | ICD-10-CM | POA: Diagnosis present

## 2015-05-01 DIAGNOSIS — Z9889 Other specified postprocedural states: Secondary | ICD-10-CM | POA: Insufficient documentation

## 2015-05-01 DIAGNOSIS — M79605 Pain in left leg: Secondary | ICD-10-CM | POA: Diagnosis present

## 2015-05-01 DIAGNOSIS — M47817 Spondylosis without myelopathy or radiculopathy, lumbosacral region: Secondary | ICD-10-CM | POA: Diagnosis not present

## 2015-05-01 DIAGNOSIS — M545 Low back pain: Secondary | ICD-10-CM | POA: Diagnosis present

## 2015-05-01 LAB — PLATELET FUNCTION ASSAY
COLLAGEN / ADP: 57 s (ref 0–118)
Collagen / Epinephrine: 171 seconds (ref 0–193)

## 2015-05-01 LAB — APTT: aPTT: 27 seconds (ref 24–36)

## 2015-05-01 LAB — PROTIME-INR
INR: 0.92
Prothrombin Time: 12.6 seconds (ref 11.4–15.0)

## 2015-05-01 MED ORDER — BUPIVACAINE HCL (PF) 0.25 % IJ SOLN
INTRAMUSCULAR | Status: AC
  Administered 2015-05-01: 11:00:00
  Filled 2015-05-01: qty 30

## 2015-05-01 MED ORDER — CEFUROXIME AXETIL 250 MG PO TABS: 250.0000 mg | ORAL_TABLET | Freq: Two times a day (BID) | ORAL | Status: DC

## 2015-05-01 MED ORDER — FENTANYL CITRATE (PF) 100 MCG/2ML IJ SOLN
INTRAMUSCULAR | Status: AC
  Filled 2015-05-01: qty 2

## 2015-05-01 MED ORDER — ORPHENADRINE CITRATE 30 MG/ML IJ SOLN
INTRAMUSCULAR | Status: AC
  Administered 2015-05-01: 11:00:00
  Filled 2015-05-01: qty 2

## 2015-05-01 MED ORDER — TRIAMCINOLONE ACETONIDE 40 MG/ML IJ SUSP
INTRAMUSCULAR | Status: AC
  Administered 2015-05-01: 11:00:00
  Filled 2015-05-01: qty 1

## 2015-05-01 MED ORDER — MIDAZOLAM HCL 5 MG/5ML IJ SOLN
INTRAMUSCULAR | Status: AC
  Administered 2015-05-01: 5 mg via INTRAVENOUS
  Filled 2015-05-01: qty 5

## 2015-05-01 NOTE — Progress Notes (Signed)
Safety precautions to be maintained throughout the outpatient stay will include: orient to surroundings, keep bed in low position, maintain call bell within reach at all times, provide assistance with transfer out of bed and ambulation.  

## 2015-05-01 NOTE — Progress Notes (Signed)
Subjective:    Patient ID: Edward Schneider., male    DOB: [birth year], 59 y.o.   MRN: [id number]  HPI  PROCEDURE PERFORMED: Lumbar facet (medial branch block)   NOTE: The patient is a 59 y.o. male who returns to [facility] for further evaluation and treatment of pain involving the lumbar and lower extremity region. MRI  revealed the patient to be with evidence of multilevel degenerative changes of the lumbar spine with disc protrusion facet arthropathy foraminal narrowing status post lumbar surgery. . There is concern regarding significant component of patient's pain being due to facet syndrome The risks, benefits, and expectations of the procedure have been discussed and explained to the patient who was understanding and in agreement with suggested treatment plan. We will proceed with interventional treatment as discussed and as explained to the patient who was understanding and wished to proceed with procedure as planned.   DESCRIPTION OF PROCEDURE: Lumbar facet (medial branch block) with IV Versed, IV fentanyl conscious sedation, EKG, blood pressure, pulse, and pulse oximetry monitoring. The procedure was performed with the patient in the prone position. Betadine prep of proposed entry site performed.   NEEDLE PLACEMENT AT: Left L 2 lumbar facet (medial branch block). Under fluoroscopic guidance with oblique orientation of 15 degrees, a 22-gauge needle was inserted at the L 2 vertebral body level with needle placed at the targeted area of Burton's Eye or Eye of the Scotty Dog with documentation of needle placement in the superior and lateral border of targeted area of Burton's Eye or Eye of the Scotty Dog with oblique orientation of 15 degrees. Following documentation of needle placement at the L 2 vertebral body level, needle placement was then accomplished at the L 3 vertebral body level.   NEEDLE PLACEMENT AT the L3 and L4 VERTEBRAL BODY LEVELS ON THE LEFT SIDE The procedure was  performed at the L3 and L4 vertebral body levels exactly as was performed at the L 3 vertebral body level utilizing the same technique and under fluoroscopic guidance.  NEEDLE PLACEMENT AT THE SACRAL ALA with AP view of the lumbosacral spine. With the patient in the prone position, Betadine prep of proposed entry site accomplished, a 22 gauge needle was inserted in the region of the sacral ala (groove formed by the superior articulating process of S1 and the sacral wing). Following documentation of needle placement at the sacral ala,  needle placement was then accomplished at the S1 foramen level.   NEEDLE PLACEMENT AT THE S1 FORAMEN LEVEL under fluoroscopic guidance with AP view of the lumbosacral spine and cephalad orientation of the fluoroscope, a 22-gauge needle was placed at the superior and lateral border of the S1 foramen under fluoroscopic guidance. Following documentation of needle placement at the S1 foramen.   Needle placement was then verified at all levels on lateral view. Following documentation of needle placement at all levels on lateral view and following negative aspiration for heme and CSF, each level was injected with 1 mL of 0.25% bupivacaine with Kenalog.     LUMBAR FACET, MEDIAL BRANCH NERVE, BLOCKS PERFORMED ON THE RIGHT SIDE   The procedure was performed on the right side exactly as was performed on the left side at the same levels and utilizing the same technique under fluoroscopic guidance.     The patient tolerated the procedure well. A total of 40 mg of Kenalog was utilized for the procedure.   PLAN:  1. Medications: The patient will continue presently prescribed  medication oxycodone. 2. May consider modification of treatment regimen at time of return appointment pending response to treatment rendered on today's visit. 3. The patient is to follow-up with primary care physician Dr. Jeananne Rama for further evaluation of blood pressure and general medical condition  status post steroid injection performed on today's visit. 4. Surgical follow-up evaluation. We have discussed neurosurgical reevaluation. 5. Neurological follow-up evaluation.. Patient may be considered for PNCV/EMG studies 6. The patient may be candidate for radiofrequency procedures, implantation type procedures, and other treatment pending response to treatment and follow-up evaluation. 7. The patient has been advised to call the Pain Management Center prior to scheduled return appointment should there be significant change in condition or should patient have other concerns regarding condition prior to scheduled return appointment.  The patient is understanding and in agreement with suggested treatment plan.     Review of Systems     Objective:   Physical Exam        Assessment & Plan:

## 2015-05-01 NOTE — Patient Instructions (Addendum)
Continue present medications and antibiotics. Please obtain your antibiotic Ceftin today and begin taking antibiotic today  F/U PCP for evaliation of  BP and general medical  condition.  F/U surgical evaluation as discussed  F/U neurological evaluation.  May consider radiofrequency rhizolysis or intraspinal procedures pending response to present treatment and F/U evaluation.  Patient to call Pain Management Center should patient have concerns prior to scheduled return appointment.  Pain Management Discharge Instructions  General Discharge Instructions :  If you need to reach your doctor call: Monday-Friday 8:00 am - 4:00 pm at [phone] or toll free [phone].  After clinic hours [phone] to have operator reach doctor.  Bring all of your medication bottles to all your appointments in the pain clinic.  To cancel or reschedule your appointment with Pain Management please remember to call 24 hours in advance to avoid a fee.  Refer to the educational materials which you have been given on: General Risks, I had my Procedure. Discharge Instructions, Post Sedation.  Post Procedure Instructions:  The drugs you were given will stay in your system until tomorrow, so for the next 24 hours you should not drive, make any legal decisions or drink any alcoholic beverages.  You may eat anything you prefer, but it is better to start with liquids then soups and crackers, and gradually work up to solid foods.  Please notify your doctor immediately if you have any unusual bleeding, trouble breathing or pain that is not related to your normal pain.  Depending on the type of procedure that was done, some parts of your body may feel week and/or numb.  This usually clears up by tonight or the next day.  Walk with the use of an assistive device or accompanied by an adult for the 24 hours.  You may use ice on the affected area for the first 24 hours.  Put ice in a Ziploc bag and cover with a  towel and place against area 15 minutes on 15 minutes off.  You may switch to heat after 24 hours.GENERAL RISKS AND COMPLICATIONS  What are the risk, side effects and possible complications? Generally speaking, most procedures are safe.  However, with any procedure there are risks, side effects, and the possibility of complications.  The risks and complications are dependent upon the sites that are lesioned, or the type of nerve block to be performed.  The closer the procedure is to the spine, the more serious the risks are.  Great care is taken when placing the radio frequency needles, block needles or lesioning probes, but sometimes complications can occur. 1. Infection: Any time there is an injection through the skin, there is a risk of infection.  This is why sterile conditions are used for these blocks.  There are four possible types of infection. 1. Localized skin infection. 2. Central Nervous System Infection-This can be in the form of Meningitis, which can be deadly. 3. Epidural Infections-This can be in the form of an epidural abscess, which can cause pressure inside of the spine, causing compression of the spinal cord with subsequent paralysis. This would require an emergency surgery to decompress, and there are no guarantees that the patient would recover from the paralysis. 4. Discitis-This is an infection of the intervertebral discs.  It occurs in about 1% of discography procedures.  It is difficult to treat and it may lead to surgery.        2. Pain: the needles have to go through skin and soft tissues, will  cause soreness.       3. Damage to internal structures:  The nerves to be lesioned may be near blood vessels or    other nerves which can be potentially damaged.       4. Bleeding: Bleeding is more common if the patient is taking blood thinners such as  aspirin, Coumadin, Ticiid, Plavix, etc., or if he/she have some genetic predisposition  such as hemophilia. Bleeding into the spinal  canal can cause compression of the spinal  cord with subsequent paralysis.  This would require an emergency surgery to  decompress and there are no guarantees that the patient would recover from the  paralysis.       5. Pneumothorax:  Puncturing of a lung is a possibility, every time a needle is introduced in  the area of the chest or upper back.  Pneumothorax refers to free air around the  collapsed lung(s), inside of the thoracic cavity (chest cavity).  Another two possible  complications related to a similar event would include: Hemothorax and Chylothorax.   These are variations of the Pneumothorax, where instead of air around the collapsed  lung(s), you may have blood or chyle, respectively.       6. Spinal headaches: They may occur with any procedures in the area of the spine.       7. Persistent CSF (Cerebro-Spinal Fluid) leakage: This is a rare problem, but may occur  with prolonged intrathecal or epidural catheters either due to the formation of a fistulous  track or a dural tear.       8. Nerve damage: By working so close to the spinal cord, there is always a possibility of  nerve damage, which could be as serious as a permanent spinal cord injury with  paralysis.       9. Death:  Although rare, severe deadly allergic reactions known as "Anaphylactic  reaction" can occur to any of the medications used.      10. Worsening of the symptoms:  We can always make thing worse.  What are the chances of something like this happening? Chances of any of this occuring are extremely low.  By statistics, you have more of a chance of getting killed in a motor vehicle accident: while driving to the hospital than any of the above occurring .  Nevertheless, you should be aware that they are possibilities.  In general, it is similar to taking a shower.  Everybody knows that you can slip, hit your head and get killed.  Does that mean that you should not shower again?  Nevertheless always keep in mind that statistics  do not mean anything if you happen to be on the wrong side of them.  Even if a procedure has a 1 (one) in a 1,000,000 (million) chance of going wrong, it you happen to be that one..Also, keep in mind that by statistics, you have more of a chance of having something go wrong when taking medications.  Who should not have this procedure? If you are on a blood thinning medication (e.g. Coumadin, Plavix, see list of "Blood Thinners"), or if you have an active infection going on, you should not have the procedure.  If you are taking any blood thinners, please inform your physician.  How should I prepare for this procedure?  Do not eat or drink anything at least six hours prior to the procedure.  Bring a driver with you .  It cannot be a taxi.  Come accompanied by  an adult that can drive you back, and that is strong enough to help you if your legs get weak or numb from the local anesthetic.  Take all of your medicines the morning of the procedure with just enough water to swallow them.  If you have diabetes, make sure that you are scheduled to have your procedure done first thing in the morning, whenever possible.  If you have diabetes, take only half of your insulin dose and notify our nurse that you have done so as soon as you arrive at the clinic.  If you are diabetic, but only take blood sugar pills (oral hypoglycemic), then do not take them on the morning of your procedure.  You may take them after you have had the procedure.  Do not take aspirin or any aspirin-containing medications, at least eleven (11) days prior to the procedure.  They may prolong bleeding.  Wear loose fitting clothing that may be easy to take off and that you would not mind if it got stained with Betadine or blood.  Do not wear any jewelry or perfume  Remove any nail coloring.  It will interfere with some of our monitoring equipment.  NOTE: Remember that this is not meant to be interpreted as a complete list of all  possible complications.  Unforeseen problems may occur.  BLOOD THINNERS The following drugs contain aspirin or other products, which can cause increased bleeding during surgery and should not be taken for 2 weeks prior to and 1 week after surgery.  If you should need take something for relief of minor pain, you may take acetaminophen which is found in Tylenol,m Datril, Anacin-3 and Panadol. It is not blood thinner. The products listed below are.  Do not take any of the products listed below in addition to any listed on your instruction sheet.  A.P.C or A.P.C with Codeine Codeine Phosphate Capsules #3 Ibuprofen Ridaura  ABC compound Congesprin Imuran rimadil  Advil Cope Indocin Robaxisal  Alka-Seltzer Effervescent Pain Reliever and Antacid Coricidin or Coricidin-D  Indomethacin Rufen  Alka-Seltzer plus Cold Medicine Cosprin Ketoprofen S-A-C Tablets  Anacin Analgesic Tablets or Capsules Coumadin Korlgesic Salflex  Anacin Extra Strength Analgesic tablets or capsules CP-2 Tablets Lanoril Salicylate  Anaprox Cuprimine Capsules Levenox Salocol  Anexsia-D Dalteparin Magan Salsalate  Anodynos Darvon compound Magnesium Salicylate Sine-off  Ansaid Dasin Capsules Magsal Sodium Salicylate  Anturane Depen Capsules Marnal Soma  APF Arthritis pain formula Dewitt's Pills Measurin Stanback  Argesic Dia-Gesic Meclofenamic Sulfinpyrazone  Arthritis Bayer Timed Release Aspirin Diclofenac Meclomen Sulindac  Arthritis pain formula Anacin Dicumarol Medipren Supac  Analgesic (Safety coated) Arthralgen Diffunasal Mefanamic Suprofen  Arthritis Strength Bufferin Dihydrocodeine Mepro Compound Suprol  Arthropan liquid Dopirydamole Methcarbomol with Aspirin Synalgos  ASA tablets/Enseals Disalcid Micrainin Tagament  Ascriptin Doan's Midol Talwin  Ascriptin A/D Dolene Mobidin Tanderil  Ascriptin Extra Strength Dolobid Moblgesic Ticlid  Ascriptin with Codeine Doloprin or Doloprin with Codeine Momentum Tolectin   Asperbuf Duoprin Mono-gesic Trendar  Aspergum Duradyne Motrin or Motrin IB Triminicin  Aspirin plain, buffered or enteric coated Durasal Myochrisine Trigesic  Aspirin Suppositories Easprin Nalfon Trillsate  Aspirin with Codeine Ecotrin Regular or Extra Strength Naprosyn Uracel  Atromid-S Efficin Naproxen Ursinus  Auranofin Capsules Elmiron Neocylate Vanquish  Axotal Emagrin Norgesic Verin  Azathioprine Empirin or Empirin with Codeine Normiflo Vitamin E  Azolid Emprazil Nuprin Voltaren  Bayer Aspirin plain, buffered or children's or timed BC Tablets or powders Encaprin Orgaran Warfarin Sodium  Buff-a-Comp Enoxaparin Orudis Zorpin  Buff-a-Comp with Codeine  Equegesic Os-Cal-Gesic   Buffaprin Excedrin plain, buffered or Extra Strength Oxalid   Bufferin Arthritis Strength Feldene Oxphenbutazone   Bufferin plain or Extra Strength Feldene Capsules Oxycodone with Aspirin   Bufferin with Codeine Fenoprofen Fenoprofen Pabalate or Pabalate-SF   Buffets II Flogesic Panagesic   Buffinol plain or Extra Strength Florinal or Florinal with Codeine Panwarfarin   Buf-Tabs Flurbiprofen Penicillamine   Butalbital Compound Four-way cold tablets Penicillin   Butazolidin Fragmin Pepto-Bismol   Carbenicillin Geminisyn Percodan   Carna Arthritis Reliever Geopen Persantine   Carprofen Gold's salt Persistin   Chloramphenicol Goody's Phenylbutazone   Chloromycetin Haltrain Piroxlcam   Clmetidine heparin Plaquenil   Cllnoril Hyco-pap Ponstel   Clofibrate Hydroxy chloroquine Propoxyphen         Before stopping any of these medications, be sure to consult the physician who ordered them.  Some, such as Coumadin (Warfarin) are ordered to prevent or treat serious conditions such as "deep thrombosis", "pumonary embolisms", and other heart problems.  The amount of time that you may need off of the medication may also vary with the medication and the reason for which you were taking it.  If you are taking any of these  medications, please make sure you notify your pain physician before you undergo any procedures.

## 2015-05-02 ENCOUNTER — Telehealth: Payer: Self-pay | Admitting: *Deleted

## 2015-05-02 NOTE — Telephone Encounter (Signed)
Denies complications post procedure. 

## 2015-05-14 ENCOUNTER — Encounter: Payer: Self-pay | Admitting: Pain Medicine

## 2015-05-14 ENCOUNTER — Ambulatory Visit: Payer: Medicare Other | Attending: Pain Medicine | Admitting: Pain Medicine

## 2015-05-14 VITALS — BP 138/87 | HR 59 | Temp 97.3°F | Resp 18 | Ht 68.0 in | Wt 201.0 lb

## 2015-05-14 DIAGNOSIS — M47896 Other spondylosis, lumbar region: Secondary | ICD-10-CM | POA: Diagnosis not present

## 2015-05-14 DIAGNOSIS — G90523 Complex regional pain syndrome I of lower limb, bilateral: Secondary | ICD-10-CM | POA: Insufficient documentation

## 2015-05-14 DIAGNOSIS — M5416 Radiculopathy, lumbar region: Secondary | ICD-10-CM | POA: Diagnosis not present

## 2015-05-14 DIAGNOSIS — M1288 Other specific arthropathies, not elsewhere classified, other specified site: Secondary | ICD-10-CM | POA: Diagnosis not present

## 2015-05-14 DIAGNOSIS — M5126 Other intervertebral disc displacement, lumbar region: Secondary | ICD-10-CM | POA: Diagnosis not present

## 2015-05-14 DIAGNOSIS — M461 Sacroiliitis, not elsewhere classified: Secondary | ICD-10-CM | POA: Diagnosis not present

## 2015-05-14 DIAGNOSIS — M47816 Spondylosis without myelopathy or radiculopathy, lumbar region: Secondary | ICD-10-CM

## 2015-05-14 DIAGNOSIS — M5136 Other intervertebral disc degeneration, lumbar region: Secondary | ICD-10-CM | POA: Diagnosis not present

## 2015-05-14 DIAGNOSIS — M47817 Spondylosis without myelopathy or radiculopathy, lumbosacral region: Secondary | ICD-10-CM | POA: Diagnosis not present

## 2015-05-14 DIAGNOSIS — M79605 Pain in left leg: Secondary | ICD-10-CM | POA: Diagnosis present

## 2015-05-14 DIAGNOSIS — M79604 Pain in right leg: Secondary | ICD-10-CM | POA: Diagnosis present

## 2015-05-14 DIAGNOSIS — G588 Other specified mononeuropathies: Secondary | ICD-10-CM | POA: Insufficient documentation

## 2015-05-14 DIAGNOSIS — M79609 Pain in unspecified limb: Secondary | ICD-10-CM | POA: Diagnosis not present

## 2015-05-14 DIAGNOSIS — M545 Low back pain: Secondary | ICD-10-CM | POA: Diagnosis present

## 2015-05-14 DIAGNOSIS — G609 Hereditary and idiopathic neuropathy, unspecified: Secondary | ICD-10-CM

## 2015-05-14 MED ORDER — OXYCODONE HCL 5 MG PO TABS: ORAL_TABLET | ORAL | Status: DC

## 2015-05-14 NOTE — Progress Notes (Signed)
Safety precautions to be maintained throughout the outpatient stay will include: orient to surroundings, keep bed in low position, maintain call bell within reach at all times, provide assistance with transfer out of bed and ambulation.  

## 2015-05-14 NOTE — Patient Instructions (Addendum)
Continue present medication Avoid  Metanx  Began oxycodone with caution to avoid drowsiness confusion and other side effects as discussed  Will perform lumbar sympathetic block Wednesday, 05/22/2015  F/U PCP Dr. Jeananne Rama for evaliation of  BP and general medical  condition.  F/U surgical evaluation  F/U GI evaluation as discussed  F/U neurological evaluation  May consider radiofrequency rhizolysis or intraspinal procedures pending response to present treatment and F/U evaluation.  Patient to call [facility] should patient have concerns prior to scheduled return appointment. GENERAL RISKS AND COMPLICATIONS  What are the risk, side effects and possible complications? Generally speaking, most procedures are safe.  However, with any procedure there are risks, side effects, and the possibility of complications.  The risks and complications are dependent upon the sites that are lesioned, or the type of nerve block to be performed.  The closer the procedure is to the spine, the more serious the risks are.  Great care is taken when placing the radio frequency needles, block needles or lesioning probes, but sometimes complications can occur. 1. Infection: Any time there is an injection through the skin, there is a risk of infection.  This is why sterile conditions are used for these blocks.  There are four possible types of infection. 1. Localized skin infection. 2. Central Nervous System Infection-This can be in the form of Meningitis, which can be deadly. 3. Epidural Infections-This can be in the form of an epidural abscess, which can cause pressure inside of the spine, causing compression of the spinal cord with subsequent paralysis. This would require an emergency surgery to decompress, and there are no guarantees that the patient would recover from the paralysis. 4. Discitis-This is an infection of the intervertebral discs.  It occurs in about 1% of discography procedures.  It is  difficult to treat and it may lead to surgery.        2. Pain: the needles have to go through skin and soft tissues, will cause soreness.       3. Damage to internal structures:  The nerves to be lesioned may be near blood vessels or    other nerves which can be potentially damaged.       4. Bleeding: Bleeding is more common if the patient is taking blood thinners such as  aspirin, Coumadin, Ticiid, Plavix, etc., or if he/she have some genetic predisposition  such as hemophilia. Bleeding into the spinal canal can cause compression of the spinal  cord with subsequent paralysis.  This would require an emergency surgery to  decompress and there are no guarantees that the patient would recover from the  paralysis.       5. Pneumothorax:  Puncturing of a lung is a possibility, every time a needle is introduced in  the area of the chest or upper back.  Pneumothorax refers to free air around the  collapsed lung(s), inside of the thoracic cavity (chest cavity).  Another two possible  complications related to a similar event would include: Hemothorax and Chylothorax.   These are variations of the Pneumothorax, where instead of air around the collapsed  lung(s), you may have blood or chyle, respectively.       6. Spinal headaches: They may occur with any procedures in the area of the spine.       7. Persistent CSF (Cerebro-Spinal Fluid) leakage: This is a rare problem, but may occur  with prolonged intrathecal or epidural catheters either due to the formation of a fistulous  track or a dural tear.       8. Nerve damage: By working so close to the spinal cord, there is always a possibility of  nerve damage, which could be as serious as a permanent spinal cord injury with  paralysis.       9. Death:  Although rare, severe deadly allergic reactions known as "Anaphylactic  reaction" can occur to any of the medications used.      10. Worsening of the symptoms:  We can always make thing worse.  What are the chances of  something like this happening? Chances of any of this occuring are extremely low.  By statistics, you have more of a chance of getting killed in a motor vehicle accident: while driving to the hospital than any of the above occurring .  Nevertheless, you should be aware that they are possibilities.  In general, it is similar to taking a shower.  Everybody knows that you can slip, hit your head and get killed.  Does that mean that you should not shower again?  Nevertheless always keep in mind that statistics do not mean anything if you happen to be on the wrong side of them.  Even if a procedure has a 1 (one) in a 1,000,000 (million) chance of going wrong, it you happen to be that one..Also, keep in mind that by statistics, you have more of a chance of having something go wrong when taking medications.  Who should not have this procedure? If you are on a blood thinning medication (e.g. Coumadin, Plavix, see list of "Blood Thinners"), or if you have an active infection going on, you should not have the procedure.  If you are taking any blood thinners, please inform your physician.  How should I prepare for this procedure?  Do not eat or drink anything at least six hours prior to the procedure.  Bring a driver with you .  It cannot be a taxi.  Come accompanied by an adult that can drive you back, and that is strong enough to help you if your legs get weak or numb from the local anesthetic.  Take all of your medicines the morning of the procedure with just enough water to swallow them.  If you have diabetes, make sure that you are scheduled to have your procedure done first thing in the morning, whenever possible.  If you have diabetes, take only half of your insulin dose and notify our nurse that you have done so as soon as you arrive at the clinic.  If you are diabetic, but only take blood sugar pills (oral hypoglycemic), then do not take them on the morning of your procedure.  You may take them  after you have had the procedure.  Do not take aspirin or any aspirin-containing medications, at least eleven (11) days prior to the procedure.  They may prolong bleeding.  Wear loose fitting clothing that may be easy to take off and that you would not mind if it got stained with Betadine or blood.  Do not wear any jewelry or perfume  Remove any nail coloring.  It will interfere with some of our monitoring equipment.  NOTE: Remember that this is not meant to be interpreted as a complete list of all possible complications.  Unforeseen problems may occur.  BLOOD THINNERS The following drugs contain aspirin or other products, which can cause increased bleeding during surgery and should not be taken for 2 weeks prior to and 1 week after surgery.  If you should need take something for relief of minor pain, you may take acetaminophen which is found in Tylenol,m Datril, Anacin-3 and Panadol. It is not blood thinner. The products listed below are.  Do not take any of the products listed below in addition to any listed on your instruction sheet.  A.P.C or A.P.C with Codeine Codeine Phosphate Capsules #3 Ibuprofen Ridaura  ABC compound Congesprin Imuran rimadil  Advil Cope Indocin Robaxisal  Alka-Seltzer Effervescent Pain Reliever and Antacid Coricidin or Coricidin-D  Indomethacin Rufen  Alka-Seltzer plus Cold Medicine Cosprin Ketoprofen S-A-C Tablets  Anacin Analgesic Tablets or Capsules Coumadin Korlgesic Salflex  Anacin Extra Strength Analgesic tablets or capsules CP-2 Tablets Lanoril Salicylate  Anaprox Cuprimine Capsules Levenox Salocol  Anexsia-D Dalteparin Magan Salsalate  Anodynos Darvon compound Magnesium Salicylate Sine-off  Ansaid Dasin Capsules Magsal Sodium Salicylate  Anturane Depen Capsules Marnal Soma  APF Arthritis pain formula Dewitt's Pills Measurin Stanback  Argesic Dia-Gesic Meclofenamic Sulfinpyrazone  Arthritis Bayer Timed Release Aspirin Diclofenac Meclomen Sulindac   Arthritis pain formula Anacin Dicumarol Medipren Supac  Analgesic (Safety coated) Arthralgen Diffunasal Mefanamic Suprofen  Arthritis Strength Bufferin Dihydrocodeine Mepro Compound Suprol  Arthropan liquid Dopirydamole Methcarbomol with Aspirin Synalgos  ASA tablets/Enseals Disalcid Micrainin Tagament  Ascriptin Doan's Midol Talwin  Ascriptin A/D Dolene Mobidin Tanderil  Ascriptin Extra Strength Dolobid Moblgesic Ticlid  Ascriptin with Codeine Doloprin or Doloprin with Codeine Momentum Tolectin  Asperbuf Duoprin Mono-gesic Trendar  Aspergum Duradyne Motrin or Motrin IB Triminicin  Aspirin plain, buffered or enteric coated Durasal Myochrisine Trigesic  Aspirin Suppositories Easprin Nalfon Trillsate  Aspirin with Codeine Ecotrin Regular or Extra Strength Naprosyn Uracel  Atromid-S Efficin Naproxen Ursinus  Auranofin Capsules Elmiron Neocylate Vanquish  Axotal Emagrin Norgesic Verin  Azathioprine Empirin or Empirin with Codeine Normiflo Vitamin E  Azolid Emprazil Nuprin Voltaren  Bayer Aspirin plain, buffered or children's or timed BC Tablets or powders Encaprin Orgaran Warfarin Sodium  Buff-a-Comp Enoxaparin Orudis Zorpin  Buff-a-Comp with Codeine Equegesic Os-Cal-Gesic   Buffaprin Excedrin plain, buffered or Extra Strength Oxalid   Bufferin Arthritis Strength Feldene Oxphenbutazone   Bufferin plain or Extra Strength Feldene Capsules Oxycodone with Aspirin   Bufferin with Codeine Fenoprofen Fenoprofen Pabalate or Pabalate-SF   Buffets II Flogesic Panagesic   Buffinol plain or Extra Strength Florinal or Florinal with Codeine Panwarfarin   Buf-Tabs Flurbiprofen Penicillamine   Butalbital Compound Four-way cold tablets Penicillin   Butazolidin Fragmin Pepto-Bismol   Carbenicillin Geminisyn Percodan   Carna Arthritis Reliever Geopen Persantine   Carprofen Gold's salt Persistin   Chloramphenicol Goody's Phenylbutazone   Chloromycetin Haltrain Piroxlcam   Clmetidine heparin Plaquenil    Cllnoril Hyco-pap Ponstel   Clofibrate Hydroxy chloroquine Propoxyphen         Before stopping any of these medications, be sure to consult the physician who ordered them.  Some, such as Coumadin (Warfarin) are ordered to prevent or treat serious conditions such as "deep thrombosis", "pumonary embolisms", and other heart problems.  The amount of time that you may need off of the medication may also vary with the medication and the reason for which you were taking it.  If you are taking any of these medications, please make sure you notify your pain physician before you undergo any procedures.

## 2015-05-14 NOTE — Progress Notes (Signed)
   Subjective:    Patient ID: Edward Schneider., male    DOB: [birth year], 59 y.o.   MRN: [id number]  HPI Patient is 59 year old gentleman [facility] evaluation and treatment of pain involving the lower back and lower extremity region predominantly. Patient states that he has burning sensation of the lower extremities which is particularly bothersome during the evening. Patient is presently not taking Metanx . We have obtaining permission to prescribed medications for treatment of patient's pain from Dr. Jeananne Rama. We will proceed with interventional treatment at time return appointment consisting of lumbar sympathetic block and will prescribe oxycodone for patient on today's visit. The patient was understanding and in agreement with suggested treatment plan. There is concern regarding neuralgia contributing to patient's symptomatology as well as component of complex regional pain syndrome. The patient was understanding and in agreement with suggested treatment plan      Review of Systems     Objective:   Physical Exam There was tends to palpation of paraspinal muscles region cervical region cervical facet region of mild to moderate degree. There appeared to be unremarkable Spurling's maneuver. Patient appeared to be with bilaterally equal grip strength. Tinel and Phalen's maneuver were without increased pain of any significant degree. There was mild tenderness over the cervical facet cervical paraspinal musculature region and the thoracic facet thoracic paraspinal musculature region. Palpation of the lower thoracic paraspinal musculature region was with evidence of moderate muscle spasms. Palpation over the lumbar paraspinal musculature region lumbar facet region was with moderate moderately severe discomfort. Lateral bending and rotation and extension and palpation of the lumbar facets reproduce moderate discomfort. There was tenderness to palpation of the lower  extremities. There was negative clonus negative Homans. No new lesions of the skin were noted. Straight leg raising was tolerates approximately 20 without a definite increase of pain with dorsiflexion noted. Abdomen nontender and no costovertebral angle tenderness noted.       Assessment & Plan:  Degenerative disc disease lumbar spine Multilevel degenerative changes lumbar spine with disc protrusion, facet arthropathy, foraminal narrowing, status post lumbar surgery.  Lumbar facet syndrome  Lumbar radiculopathy  Neuralgia of the lower extremities  Complex regional pain syndrome of lower extremities    Plan  Continue present medications and begin oxycodone... Patient has been cautioned regarding respiratory depression confusion and other side effects of medication Patient not taking  Metanx  Lumbar sympathetic block to be performed at time of return appointment  F/U PCP Dr. Jeananne Rama evaliation of  BP and general medical  condition.  F/U surgical evaluation. Neurosurgical reevaluation as discussed  F/U neurological evaluation. Neurological evaluation with PNCV/EMG studies to be considered as discussed  May consider radiofrequency rhizolysis or intraspinal procedures pending response to present treatment and F/U evaluation.  Patient to call [facility] should patient have concerns prior to scheduled return appointment.

## 2015-05-22 ENCOUNTER — Encounter: Payer: Self-pay | Admitting: Pain Medicine

## 2015-05-22 ENCOUNTER — Ambulatory Visit: Payer: Medicare Other | Attending: Pain Medicine | Admitting: Pain Medicine

## 2015-05-22 VITALS — BP 118/72 | HR 46 | Temp 97.6°F | Resp 16 | Ht 68.0 in | Wt 201.0 lb

## 2015-05-22 DIAGNOSIS — M545 Low back pain: Secondary | ICD-10-CM | POA: Diagnosis not present

## 2015-05-22 DIAGNOSIS — M792 Neuralgia and neuritis, unspecified: Secondary | ICD-10-CM | POA: Insufficient documentation

## 2015-05-22 DIAGNOSIS — M47812 Spondylosis without myelopathy or radiculopathy, cervical region: Secondary | ICD-10-CM

## 2015-05-22 DIAGNOSIS — M79605 Pain in left leg: Secondary | ICD-10-CM | POA: Diagnosis not present

## 2015-05-22 DIAGNOSIS — G609 Hereditary and idiopathic neuropathy, unspecified: Secondary | ICD-10-CM | POA: Insufficient documentation

## 2015-05-22 DIAGNOSIS — M5481 Occipital neuralgia: Secondary | ICD-10-CM

## 2015-05-22 DIAGNOSIS — M47817 Spondylosis without myelopathy or radiculopathy, lumbosacral region: Secondary | ICD-10-CM | POA: Diagnosis not present

## 2015-05-22 DIAGNOSIS — Z981 Arthrodesis status: Secondary | ICD-10-CM

## 2015-05-22 DIAGNOSIS — M79604 Pain in right leg: Secondary | ICD-10-CM | POA: Insufficient documentation

## 2015-05-22 DIAGNOSIS — M533 Sacrococcygeal disorders, not elsewhere classified: Secondary | ICD-10-CM

## 2015-05-22 DIAGNOSIS — M47816 Spondylosis without myelopathy or radiculopathy, lumbar region: Secondary | ICD-10-CM

## 2015-05-22 DIAGNOSIS — M5030 Other cervical disc degeneration, unspecified cervical region: Secondary | ICD-10-CM

## 2015-05-22 DIAGNOSIS — M5136 Other intervertebral disc degeneration, lumbar region: Secondary | ICD-10-CM

## 2015-05-22 DIAGNOSIS — M961 Postlaminectomy syndrome, not elsewhere classified: Secondary | ICD-10-CM

## 2015-05-22 MED ORDER — BUPIVACAINE HCL (PF) 0.25 % IJ SOLN
INTRAMUSCULAR | Status: AC
  Administered 2015-05-22: 12:00:00
  Filled 2015-05-22: qty 30

## 2015-05-22 MED ORDER — CEFAZOLIN SODIUM 1 G IJ SOLR
INTRAMUSCULAR | Status: AC
Start: 2015-05-22 — End: 2015-05-22
  Administered 2015-05-22: 1 g via INTRAVENOUS
  Filled 2015-05-22: qty 10

## 2015-05-22 MED ORDER — CEFAZOLIN SODIUM 1 G IJ SOLR
INTRAMUSCULAR | Status: AC
  Filled 2015-05-22: qty 10

## 2015-05-22 MED ORDER — CEFUROXIME AXETIL 250 MG PO TABS: 250.0000 mg | ORAL_TABLET | Freq: Two times a day (BID) | ORAL | Status: DC

## 2015-05-22 MED ORDER — MIDAZOLAM HCL 5 MG/5ML IJ SOLN
INTRAMUSCULAR | Status: AC
  Administered 2015-05-22: 5 mg via INTRAVENOUS
  Filled 2015-05-22: qty 5

## 2015-05-22 NOTE — Patient Instructions (Addendum)
Continue present medications and antibiotics. Please obtain your antibiotic Ceftin today and begin taking antibiotic today  F/U PCP Dr. Jeananne Rama for evaliation of  BP and general medical  condition.  F/U surgical evaluation as discussed  F/U neurological evaluation.  May consider radiofrequency rhizolysis or intraspinal procedures pending response to present treatment and F/U evaluation. SCRIPT AT PHARMACY   Patient to call [facility] should patient have concerns prior to scheduled return appointment.  Pain Management Discharge Instructions  General Discharge Instructions :  If you need to reach your doctor call: Monday-Friday 8:00 am - 4:00 pm at [phone] or toll free [phone].  After clinic hours [phone] to have operator reach doctor.  Bring all of your medication bottles to all your appointments in the pain clinic.  To cancel or reschedule your appointment with Pain Management please remember to call 24 hours in advance to avoid a fee.  Refer to the educational materials which you have been given on: General Risks, I had my Procedure. Discharge Instructions, Post Sedation.  Post Procedure Instructions:  The drugs you were given will stay in your system until tomorrow, so for the next 24 hours you should not drive, make any legal decisions or drink any alcoholic beverages.  You may eat anything you prefer, but it is better to start with liquids then soups and crackers, and gradually work up to solid foods.  Please notify your doctor immediately if you have any unusual bleeding, trouble breathing or pain that is not related to your normal pain.  Depending on the type of procedure that was done, some parts of your body may feel week and/or numb.  This usually clears up by tonight or the next day.  Walk with the use of an assistive device or accompanied by an adult for the 24 hours.  You may use ice on the affected area for the first 24 hours.  Put ice in  a Ziploc bag and cover with a towel and place against area 15 minutes on 15 minutes off.  You may switch to heat after 24 hours.Lumbar Sympathetic Block Patient Information  Description: The lumbar plexus is a group of nerves that are part of the sympathetic nervous system.  These nerves supply organs in the pelvis and legs.  Lumbar sympathetic blocks are utilized for the diagnosis and treatment of painful conditions in these areas.   The lumbar plexus is located on both sides of the aorta at approximately the level of the second lumbar vertebral body.  The block will be performed with you lying on your abdomen with a pillow underneath.  Using direct x-ray guidance,   The plexus will be located on both sides of the spine.  Numbing medicine will be used to deaden the skin prior to needle insertion.  In most cases, a small amount of sedation can be give by IV prior to the numbing medicine.  One or two small needles will be placed near the plexus and local anesthetic will be injected.  This may make your leg(s) feel warm.  The Entire block usually lasts about 15-25 minutes.  Conditions which may be treated by lumbar sympathetic block:   Reflex sympathetic dystrophy  Phantom limb pain  Peripheral neuropathy  Peripheral vascular disease ( inadequate blood flow )  Cancer pain of pelvis, leg and kidney  Preparation for the injection:  1. Do note eat any solid food or diary products within 6 hours of your appointment. 2. You may drink clear liquids up  to 2 hours before appointment.  Clear liquids include water, black coffee, juice or soda.  No milk or cream please. 3. You may take your regular medication, including pain medications, with a sip of water before you appointment.  Diabetics should hold regular insulin ( if taken separately ) and take 1/2 NPH dose the morning of the procedure .  Carry some sugar containing items with you to your appointment. 4. A driver must accompany you and be prepared  to drive you home after your procedure. 5. Bring all your current medication with you. 6. An IV may be inserted and sedation may be given at the discretion of the physician.  7. A blood pressure cuff, EKG and other monitors will often be applied during the procedure.  Some patients may need to have extra oxygen administered for a short period. 8. You will be asked to provide medical information, including your allergies and medications, prior to the procedure.  We must know immediately if your taking blood thinners (like Coumadin/Warfarin) or if you are allergic to IV iodine contrast (dye).  We must know if you could possibly be pregnant.  Possible side-effects   Bleeding from needle site or deeper  Infection (rare, can require surgery)  Nerve injury (rare)  Numbness & tingling (temporary)  Collapsed lung (rare)  Spinal headache (a headache worse with upright posture)  Light-headedness (temporary)  Pain at injection site (several days)  Decreased blood pressure (temporary)  Weakness in legs (temporary)  Seizure or other drug reaction (rare)  Call if you experience:   Fever/chills associated with headache or increased back/ neck pain  Headache worsened by an upright position  New onset weakness or numbness of an extremity below the injection site  Hives or difficulty breathing ( go to the emergency room)  Inflammation or drainage at the injections site(s)  New symptoms which are concerning to you  Please note:  If effective, we will often do a series of 2-3 injections spaced 3-6 weeks apart to maximally decrease your pain.  If initial series is effective, you may be a candidate for a more permanent block of the lumbar sympathetic plexus.  If you have any questions please call [phone] [facility]

## 2015-05-22 NOTE — Progress Notes (Signed)
Safety precautions to be maintained throughout the outpatient stay will include: orient to surroundings, keep bed in low position, maintain call bell within reach at all times, provide assistance with transfer out of bed and ambulation.  

## 2015-05-22 NOTE — Progress Notes (Signed)
Subjective:    Patient ID: Edward Schneider., male    DOB: 1956/02/05, 59 y.o.   MRN: [id number]  HPI   PROCEDURE PERFORMED: Lumbar sympathetic block.  HISTORY OF PRESENT ILLNESS: The patient is 59 y.o. male who returns to [facility] for further evaluation and treatment of pain involving the lower extremities. The patient is with history of  Prior surgical intervention of the lumbar region with severely disabling pain of the lower extremities with pain described as  burning lancinating shooting throbbing sensations . There is concern regarding the patient's pain being due to  Neuropathy as well as concern regarding component of neuralgia and radiculopathy . The risks, benefits, and expectations of the procedure were discussed and explained to the patient who was understanding and wished to proceed with interventional treatment as planned.   DESCRIPTION OF PROCEDURE: Lumbar sympathetic block with IV Versed, IV fentanyl conscious sedation, EKG, blood pressure, pulse, pulse oximetry monitoring. The procedure was performed with the patient in prone position under fluoroscopic guidance.   NEEDLE PLACEMENT AT L2,  left side lumbar sympathetic block: With the patient in prone position and oblique orientation of 20 degrees, Betadine prep and local anesthetic skin wheal of 1.5% lidocaine plain was prepared at the proposed needle entry site. Under fluoroscopic guidance with 20 degrees oblique orientation, the 22 -gauge needle was inserted at the lateral border of the L2 vertebral body on the  left side.   NEEDLE PLACEMENT AT L3,  left side lumbar sympathetic block: With the patient in the prone position and oblique orientation of 20 degrees, Betadine prep and local anesthetic skin wheal of 1.5% lidocaine plain was prepared at the proposed needle entry site. Under fluoroscopic guidance with 20 degrees  oblique orientation, the 22 -gauge needle was inserted at the lateral border of the L3 vertebral  body on the  left side.   NEEDLE PLACEMENT AT L4,  left side lumbar sympathetic block: With the patient in prone position and oblique orientation of 20 degrees, Betadine prep and local anesthetic skin wheal of 1.5% lidocaine plain was prepared at the proposed needle entry site. Under fluoroscopic guidance with 20 degrees  oblique orientation, the 22 -gauge needle was inserted at the lateral border of the L4 vertebral body on the  left side.    Following needle placement at the L2, L3 and L4 vertebral body levels on the  left side, needle placement was then verified on lateral view with tip of the needle documented to be in the anterior third of the vertebral body of L2, L3 and L4 respectively. Following negative aspiration of each needle for heme and CSF, L2 vertebral body level needle was injected with 10 mL of 0.25% bupivacaine. L3 vertebral body level needle was injected with 10 mL of 0.25% bupivacaine. L4 vertebral body level was injected with 10 mL of 0.25% bupivacaine. Needles were removed. Please note temperature readings prior to lumbar sympathetic block were noted to be  88.3 degrees Fahrenheit and following completion of the lumbar sympathetic block temperature readings of the lower extremity were noted to be  91 degrees Fahrenheit. The patient tolerated the procedure well.  PLAN:   1. Medications: We will continue presently prescribed medications at this time. 2. The patient is to follow-up with primary care physician Dr. Jeananne Rama for further evaluation of blood pressure and general medical condition as discussed. 3. Surgical evaluation as discussed. We discussed surgical evaluation and will consider neurosurgical evaluation 4. Neurological evaluation and  follow-up  Gastroenterological evaluation as discussed. 5. The patient may be candidate for radiofrequency procedures, implantation devices, and other treatment pending response to treatment and follow-up evaluation. 6. The patient has been  advised to adhere to proper body mechanics. 7. The patient has been advised to call the [facility] Management Center prior to scheduled return appointment should there be significant change in condition or have other concerns regarding condition prior to scheduled return appointment.   The patient was understanding and in agreement with suggested treatment plan.    Review of Systems     Objective:   Physical Exam        Assessment & Plan:

## 2015-05-23 ENCOUNTER — Telehealth: Payer: Self-pay | Admitting: *Deleted

## 2015-05-23 NOTE — Telephone Encounter (Signed)
Denies complications post procedure. 

## 2015-05-30 ENCOUNTER — Ambulatory Visit: Payer: Medicare Other | Admitting: Pain Medicine

## 2015-06-13 ENCOUNTER — Encounter: Payer: Self-pay | Admitting: Pain Medicine

## 2015-06-13 ENCOUNTER — Ambulatory Visit: Payer: Medicare Other | Attending: Pain Medicine | Admitting: Pain Medicine

## 2015-06-13 VITALS — BP 128/87 | HR 60 | Temp 98.2°F | Resp 18 | Ht 68.0 in | Wt 189.0 lb

## 2015-06-13 DIAGNOSIS — M1288 Other specific arthropathies, not elsewhere classified, other specified site: Secondary | ICD-10-CM | POA: Insufficient documentation

## 2015-06-13 DIAGNOSIS — M5416 Radiculopathy, lumbar region: Secondary | ICD-10-CM | POA: Diagnosis not present

## 2015-06-13 DIAGNOSIS — M545 Low back pain: Secondary | ICD-10-CM | POA: Diagnosis present

## 2015-06-13 DIAGNOSIS — M47896 Other spondylosis, lumbar region: Secondary | ICD-10-CM | POA: Diagnosis not present

## 2015-06-13 DIAGNOSIS — Z9889 Other specified postprocedural states: Secondary | ICD-10-CM | POA: Insufficient documentation

## 2015-06-13 DIAGNOSIS — M961 Postlaminectomy syndrome, not elsewhere classified: Secondary | ICD-10-CM

## 2015-06-13 DIAGNOSIS — M5136 Other intervertebral disc degeneration, lumbar region: Secondary | ICD-10-CM | POA: Diagnosis not present

## 2015-06-13 DIAGNOSIS — Z981 Arthrodesis status: Secondary | ICD-10-CM

## 2015-06-13 DIAGNOSIS — M47812 Spondylosis without myelopathy or radiculopathy, cervical region: Secondary | ICD-10-CM

## 2015-06-13 DIAGNOSIS — M47816 Spondylosis without myelopathy or radiculopathy, lumbar region: Secondary | ICD-10-CM

## 2015-06-13 DIAGNOSIS — M79604 Pain in right leg: Secondary | ICD-10-CM | POA: Diagnosis present

## 2015-06-13 DIAGNOSIS — M79605 Pain in left leg: Secondary | ICD-10-CM | POA: Diagnosis present

## 2015-06-13 DIAGNOSIS — M5126 Other intervertebral disc displacement, lumbar region: Secondary | ICD-10-CM | POA: Diagnosis not present

## 2015-06-13 DIAGNOSIS — G90523 Complex regional pain syndrome I of lower limb, bilateral: Secondary | ICD-10-CM | POA: Insufficient documentation

## 2015-06-13 DIAGNOSIS — M47817 Spondylosis without myelopathy or radiculopathy, lumbosacral region: Secondary | ICD-10-CM | POA: Diagnosis not present

## 2015-06-13 DIAGNOSIS — M533 Sacrococcygeal disorders, not elsewhere classified: Secondary | ICD-10-CM | POA: Diagnosis not present

## 2015-06-13 DIAGNOSIS — M79609 Pain in unspecified limb: Secondary | ICD-10-CM | POA: Diagnosis not present

## 2015-06-13 DIAGNOSIS — M5481 Occipital neuralgia: Secondary | ICD-10-CM

## 2015-06-13 MED ORDER — OXYCODONE HCL 5 MG PO TABS: ORAL_TABLET | ORAL | Status: DC

## 2015-06-13 MED ORDER — METANX 3-35-2 MG PO TABS: ORAL_TABLET | ORAL | Status: DC

## 2015-06-13 NOTE — Progress Notes (Signed)
Safety precautions to be maintained throughout the outpatient stay will include: orient to surroundings, keep bed in low position, maintain call bell within reach at all times, provide assistance with transfer out of bed and ambulation.  

## 2015-06-13 NOTE — Patient Instructions (Addendum)
Continue present medication Metanx and oxycodone  Lumbar epidural steroid injection to be performed at time return appointment  F/U PCP Dr. Jeananne Rama for evaliation of  BP and general medical  condition  F/U surgical evaluation  F/U neurological evaluation  May consider radiofrequency rhizolysis or intraspinal procedures pending response to present treatment and F/U evaluation   Patient to call [facility] should patient have concerns prior to scheduled return appointmen. GENERAL RISKS AND COMPLICATIONS  What are the risk, side effects and possible complications? Generally speaking, most procedures are safe.  However, with any procedure there are risks, side effects, and the possibility of complications.  The risks and complications are dependent upon the sites that are lesioned, or the type of nerve block to be performed.  The closer the procedure is to the spine, the more serious the risks are.  Great care is taken when placing the radio frequency needles, block needles or lesioning probes, but sometimes complications can occur. 1. Infection: Any time there is an injection through the skin, there is a risk of infection.  This is why sterile conditions are used for these blocks.  There are four possible types of infection. 1. Localized skin infection. 2. Central Nervous System Infection-This can be in the form of Meningitis, which can be deadly. 3. Epidural Infections-This can be in the form of an epidural abscess, which can cause pressure inside of the spine, causing compression of the spinal cord with subsequent paralysis. This would require an emergency surgery to decompress, and there are no guarantees that the patient would recover from the paralysis. 4. Discitis-This is an infection of the intervertebral discs.  It occurs in about 1% of discography procedures.  It is difficult to treat and it may lead to surgery.        2. Pain: the needles have to go through skin and soft  tissues, will cause soreness.       3. Damage to internal structures:  The nerves to be lesioned may be near blood vessels or    other nerves which can be potentially damaged.       4. Bleeding: Bleeding is more common if the patient is taking blood thinners such as  aspirin, Coumadin, Ticiid, Plavix, etc., or if he/she have some genetic predisposition  such as hemophilia. Bleeding into the spinal canal can cause compression of the spinal  cord with subsequent paralysis.  This would require an emergency surgery to  decompress and there are no guarantees that the patient would recover from the  paralysis.       5. Pneumothorax:  Puncturing of a lung is a possibility, every time a needle is introduced in  the area of the chest or upper back.  Pneumothorax refers to free air around the  collapsed lung(s), inside of the thoracic cavity (chest cavity).  Another two possible  complications related to a similar event would include: Hemothorax and Chylothorax.   These are variations of the Pneumothorax, where instead of air around the collapsed  lung(s), you may have blood or chyle, respectively.       6. Spinal headaches: They may occur with any procedures in the area of the spine.       7. Persistent CSF (Cerebro-Spinal Fluid) leakage: This is a rare problem, but may occur  with prolonged intrathecal or epidural catheters either due to the formation of a fistulous  track or a dural tear.       8. Nerve damage: By working  so close to the spinal cord, there is always a possibility of  nerve damage, which could be as serious as a permanent spinal cord injury with  paralysis.       9. Death:  Although rare, severe deadly allergic reactions known as "Anaphylactic  reaction" can occur to any of the medications used.      10. Worsening of the symptoms:  We can always make thing worse.  What are the chances of something like this happening? Chances of any of this occuring are extremely low.  By statistics, you have  more of a chance of getting killed in a motor vehicle accident: while driving to the hospital than any of the above occurring .  Nevertheless, you should be aware that they are possibilities.  In general, it is similar to taking a shower.  Everybody knows that you can slip, hit your head and get killed.  Does that mean that you should not shower again?  Nevertheless always keep in mind that statistics do not mean anything if you happen to be on the wrong side of them.  Even if a procedure has a 1 (one) in a 1,000,000 (million) chance of going wrong, it you happen to be that one..Also, keep in mind that by statistics, you have more of a chance of having something go wrong when taking medications.  Who should not have this procedure? If you are on a blood thinning medication (e.g. Coumadin, Plavix, see list of "Blood Thinners"), or if you have an active infection going on, you should not have the procedure.  If you are taking any blood thinners, please inform your physician.  How should I prepare for this procedure?  Do not eat or drink anything at least six hours prior to the procedure.  Bring a driver with you .  It cannot be a taxi.  Come accompanied by an adult that can drive you back, and that is strong enough to help you if your legs get weak or numb from the local anesthetic.  Take all of your medicines the morning of the procedure with just enough water to swallow them.  If you have diabetes, make sure that you are scheduled to have your procedure done first thing in the morning, whenever possible.  If you have diabetes, take only half of your insulin dose and notify our nurse that you have done so as soon as you arrive at the clinic.  If you are diabetic, but only take blood sugar pills (oral hypoglycemic), then do not take them on the morning of your procedure.  You may take them after you have had the procedure.  Do not take aspirin or any aspirin-containing medications, at least eleven  (11) days prior to the procedure.  They may prolong bleeding.  Wear loose fitting clothing that may be easy to take off and that you would not mind if it got stained with Betadine or blood.  Do not wear any jewelry or perfume  Remove any nail coloring.  It will interfere with some of our monitoring equipment.  NOTE: Remember that this is not meant to be interpreted as a complete list of all possible complications.  Unforeseen problems may occur.  BLOOD THINNERS The following drugs contain aspirin or other products, which can cause increased bleeding during surgery and should not be taken for 2 weeks prior to and 1 week after surgery.  If you should need take something for relief of minor pain, you may take acetaminophen  which is found in Tylenol,m Datril, Anacin-3 and Panadol. It is not blood thinner. The products listed below are.  Do not take any of the products listed below in addition to any listed on your instruction sheet.  A.P.C or A.P.C with Codeine Codeine Phosphate Capsules #3 Ibuprofen Ridaura  ABC compound Congesprin Imuran rimadil  Advil Cope Indocin Robaxisal  Alka-Seltzer Effervescent Pain Reliever and Antacid Coricidin or Coricidin-D  Indomethacin Rufen  Alka-Seltzer plus Cold Medicine Cosprin Ketoprofen S-A-C Tablets  Anacin Analgesic Tablets or Capsules Coumadin Korlgesic Salflex  Anacin Extra Strength Analgesic tablets or capsules CP-2 Tablets Lanoril Salicylate  Anaprox Cuprimine Capsules Levenox Salocol  Anexsia-D Dalteparin Magan Salsalate  Anodynos Darvon compound Magnesium Salicylate Sine-off  Ansaid Dasin Capsules Magsal Sodium Salicylate  Anturane Depen Capsules Marnal Soma  APF Arthritis pain formula Dewitt's Pills Measurin Stanback  Argesic Dia-Gesic Meclofenamic Sulfinpyrazone  Arthritis Bayer Timed Release Aspirin Diclofenac Meclomen Sulindac  Arthritis pain formula Anacin Dicumarol Medipren Supac  Analgesic (Safety coated) Arthralgen Diffunasal Mefanamic  Suprofen  Arthritis Strength Bufferin Dihydrocodeine Mepro Compound Suprol  Arthropan liquid Dopirydamole Methcarbomol with Aspirin Synalgos  ASA tablets/Enseals Disalcid Micrainin Tagament  Ascriptin Doan's Midol Talwin  Ascriptin A/D Dolene Mobidin Tanderil  Ascriptin Extra Strength Dolobid Moblgesic Ticlid  Ascriptin with Codeine Doloprin or Doloprin with Codeine Momentum Tolectin  Asperbuf Duoprin Mono-gesic Trendar  Aspergum Duradyne Motrin or Motrin IB Triminicin  Aspirin plain, buffered or enteric coated Durasal Myochrisine Trigesic  Aspirin Suppositories Easprin Nalfon Trillsate  Aspirin with Codeine Ecotrin Regular or Extra Strength Naprosyn Uracel  Atromid-S Efficin Naproxen Ursinus  Auranofin Capsules Elmiron Neocylate Vanquish  Axotal Emagrin Norgesic Verin  Azathioprine Empirin or Empirin with Codeine Normiflo Vitamin E  Azolid Emprazil Nuprin Voltaren  Bayer Aspirin plain, buffered or children's or timed BC Tablets or powders Encaprin Orgaran Warfarin Sodium  Buff-a-Comp Enoxaparin Orudis Zorpin  Buff-a-Comp with Codeine Equegesic Os-Cal-Gesic   Buffaprin Excedrin plain, buffered or Extra Strength Oxalid   Bufferin Arthritis Strength Feldene Oxphenbutazone   Bufferin plain or Extra Strength Feldene Capsules Oxycodone with Aspirin   Bufferin with Codeine Fenoprofen Fenoprofen Pabalate or Pabalate-SF   Buffets II Flogesic Panagesic   Buffinol plain or Extra Strength Florinal or Florinal with Codeine Panwarfarin   Buf-Tabs Flurbiprofen Penicillamine   Butalbital Compound Four-way cold tablets Penicillin   Butazolidin Fragmin Pepto-Bismol   Carbenicillin Geminisyn Percodan   Carna Arthritis Reliever Geopen Persantine   Carprofen Gold's salt Persistin   Chloramphenicol Goody's Phenylbutazone   Chloromycetin Haltrain Piroxlcam   Clmetidine heparin Plaquenil   Cllnoril Hyco-pap Ponstel   Clofibrate Hydroxy chloroquine Propoxyphen         Before stopping any of these  medications, be sure to consult the physician who ordered them.  Some, such as Coumadin (Warfarin) are ordered to prevent or treat serious conditions such as "deep thrombosis", "pumonary embolisms", and other heart problems.  The amount of time that you may need off of the medication may also vary with the medication and the reason for which you were taking it.  If you are taking any of these medications, please make sure you notify your pain physician before you undergo any procedures.         Epidural Steroid Injection Patient Information  Description: The epidural space surrounds the nerves as they exit the spinal cord.  In some patients, the nerves can be compressed and inflamed by a bulging disc or a tight spinal canal (spinal stenosis).  By injecting steroids into the epidural space,  we can bring irritated nerves into direct contact with a potentially helpful medication.  These steroids act directly on the irritated nerves and can reduce swelling and inflammation which often leads to decreased pain.  Epidural steroids may be injected anywhere along the spine and from the neck to the low back depending upon the location of your pain.   After numbing the skin with local anesthetic (like Novocaine), a small needle is passed into the epidural space slowly.  You may experience a sensation of pressure while this is being done.  The entire block usually last less than 10 minutes.  Conditions which may be treated by epidural steroids:   Low back and leg pain  Neck and arm pain  Spinal stenosis  Post-laminectomy syndrome  Herpes zoster (shingles) pain  Pain from compression fractures  Preparation for the injection:  1. Do not eat any solid food or dairy products within 6 hours of your appointment.  2. You may drink clear liquids up to 2 hours before appointment.  Clear liquids include water, black coffee, juice or soda.  No milk or cream please. 3. You may take your regular medication,  including pain medications, with a sip of water before your appointment  Diabetics should hold regular insulin (if taken separately) and take 1/2 normal NPH dos the morning of the procedure.  Carry some sugar containing items with you to your appointment. 4. A driver must accompany you and be prepared to drive you home after your procedure.  5. Bring all your current medications with your. 6. An IV may be inserted and sedation may be given at the discretion of the physician.   7. A blood pressure cuff, EKG and other monitors will often be applied during the procedure.  Some patients may need to have extra oxygen administered for a short period. 8. You will be asked to provide medical information, including your allergies, prior to the procedure.  We must know immediately if you are taking blood thinners (like Coumadin/Warfarin)  Or if you are allergic to IV iodine contrast (dye). We must know if you could possible be pregnant.  Possible side-effects:  Bleeding from needle site  Infection (rare, may require surgery)  Nerve injury (rare)  Numbness & tingling (temporary)  Difficulty urinating (rare, temporary)  Spinal headache ( a headache worse with upright posture)  Light -headedness (temporary)  Pain at injection site (several days)  Decreased blood pressure (temporary)  Weakness in arm/leg (temporary)  Pressure sensation in back/neck (temporary)  Call if you experience:  Fever/chills associated with headache or increased back/neck pain.  Headache worsened by an upright position.  New onset weakness or numbness of an extremity below the injection site  Hives or difficulty breathing (go to the emergency room)  Inflammation or drainage at the infection site  Severe back/neck pain  Any new symptoms which are concerning to you  Please note:  Although the local anesthetic injected can often make your back or neck feel good for several hours after the injection, the pain  will likely return.  It takes 3-7 days for steroids to work in the epidural space.  You may not notice any pain relief for at least that one week.  If effective, we will often do a series of three injections spaced 3-6 weeks apart to maximally decrease your pain.  After the initial series, we generally will wait several months before considering a repeat injection of the same type.  If you have any questions, please call [phone] [facility]

## 2015-06-13 NOTE — Progress Notes (Signed)
Discharge instructions given.  Discharged to home, ambulatory with script in hand for oxycodone.  Pre procedure instructions given with teach back 3 done.

## 2015-06-13 NOTE — Progress Notes (Signed)
   Subjective:    Patient ID: Edward Schneider., male    DOB: 22-Aug-1956, 59 y.o.   MRN: [id number]  HPI  Patient is 59 year old gentleman [facility] for further evaluation and treatment of pain involving the lower back and lower extremity region predominantly patient is status post prior surgical intervention of the lumbar region by Dr. Trenton Gammon without plans for additional surgical intervention of the lumbar region. Patient states that is a burning stinging sensation of the lower back and lower extremity regions. Patient continues medicine And oxycodone as prescribed. We discussed patient's condition and will consider patient for lumbar epidural steroid injection at time return appointment as discussed. The patient was understanding and agreement status treatment plan.    Review of Systems     Objective:   Physical Exam  There was mild tenderness of the splenius capitis and occipitalis musculature regions. There was tenderness of the cervical facet cervical paraspinal musculature region of mild degree. Palpation over the region of the thoracic facet thoracic paraspinal muscles region was with mild tends to palpation. There was no crepitus of the thoracic region noted. Tinel and Phalen's maneuver without increased pain of significant degree. Palpation over the region of the lumbar facet lumbar paraspinal musculature region was a tends to palpation of moderate degree. Straight leg raising was limited to approximately 20 without a definite increased pain with dorsiflexion noted. DTRs difficult to elicit patient difficulty relaxing. There was tends to palpation of the PSIS and PII S region of mild to moderate degree. Palpation of the gluteal and piriformis muscles reproduced mild to moderate discomfort. There was mild tenderness of the greater trochanteric region and iliotibial band region. Palpation over the lumbar facet lumbar paraspinal muscular region reproduced neck and pain.  There was questionable decreased sensation along the L5 dermatomal distribution. There was negative clonus negative Homans. Abdomen nontender and no costovertebral tenderness noted.      Assessment & Plan:    Degenerative disc disease lumbar spine Multilevel degenerative changes lumbar spine with disc protrusion, facet arthropathy, foraminal narrowing, status post lumbar surgery.  Lumbar facet syndrome  Lumbar radiculopathy  Neuralgia of the lower extremities  Complex regional pain syndrome of lower extremities    Plan   Continue present medications Metanx and oxycodone  Lumbar epidural steroid injection to be performed at time return appointment  F/U PCP  Dr. Jeananne Rama for evaliation of  BP and general medical  condition  F/U surgical evaluation. We have discussed surgical evaluation and will consider follow-up with  Dr. Trenton Gammon of the neurosurgeon   F/U neurological evaluation has been addressed as well  May consider radiofrequency rhizolysis  pending response to present treatment and F/U evaluation as discussed with patient  Patient to call [facility] should patient have concerns prior to scheduled return appointmen.

## 2015-06-25 ENCOUNTER — Ambulatory Visit: Payer: Medicare Other | Admitting: Pain Medicine

## 2015-07-08 ENCOUNTER — Encounter: Payer: Self-pay | Admitting: Pain Medicine

## 2015-07-08 ENCOUNTER — Ambulatory Visit: Payer: Medicare Other | Attending: Pain Medicine | Admitting: Pain Medicine

## 2015-07-08 VITALS — BP 129/65 | HR 53 | Temp 95.6°F | Resp 16 | Ht 68.0 in | Wt 188.0 lb

## 2015-07-08 DIAGNOSIS — M47812 Spondylosis without myelopathy or radiculopathy, cervical region: Secondary | ICD-10-CM

## 2015-07-08 DIAGNOSIS — Z9889 Other specified postprocedural states: Secondary | ICD-10-CM | POA: Insufficient documentation

## 2015-07-08 DIAGNOSIS — M533 Sacrococcygeal disorders, not elsewhere classified: Secondary | ICD-10-CM

## 2015-07-08 DIAGNOSIS — M5481 Occipital neuralgia: Secondary | ICD-10-CM

## 2015-07-08 DIAGNOSIS — M5126 Other intervertebral disc displacement, lumbar region: Secondary | ICD-10-CM | POA: Insufficient documentation

## 2015-07-08 DIAGNOSIS — M792 Neuralgia and neuritis, unspecified: Secondary | ICD-10-CM

## 2015-07-08 DIAGNOSIS — G609 Hereditary and idiopathic neuropathy, unspecified: Secondary | ICD-10-CM

## 2015-07-08 DIAGNOSIS — M545 Low back pain: Secondary | ICD-10-CM | POA: Diagnosis present

## 2015-07-08 DIAGNOSIS — M5136 Other intervertebral disc degeneration, lumbar region: Secondary | ICD-10-CM | POA: Diagnosis not present

## 2015-07-08 DIAGNOSIS — M1288 Other specific arthropathies, not elsewhere classified, other specified site: Secondary | ICD-10-CM | POA: Insufficient documentation

## 2015-07-08 DIAGNOSIS — Z981 Arthrodesis status: Secondary | ICD-10-CM

## 2015-07-08 DIAGNOSIS — Z0389 Encounter for observation for other suspected diseases and conditions ruled out: Secondary | ICD-10-CM | POA: Diagnosis not present

## 2015-07-08 DIAGNOSIS — M5030 Other cervical disc degeneration, unspecified cervical region: Secondary | ICD-10-CM

## 2015-07-08 DIAGNOSIS — Z5181 Encounter for therapeutic drug level monitoring: Secondary | ICD-10-CM | POA: Diagnosis not present

## 2015-07-08 DIAGNOSIS — F1120 Opioid dependence, uncomplicated: Secondary | ICD-10-CM | POA: Diagnosis not present

## 2015-07-08 DIAGNOSIS — M961 Postlaminectomy syndrome, not elsewhere classified: Secondary | ICD-10-CM

## 2015-07-08 DIAGNOSIS — M5416 Radiculopathy, lumbar region: Secondary | ICD-10-CM | POA: Diagnosis not present

## 2015-07-08 DIAGNOSIS — Z79899 Other long term (current) drug therapy: Secondary | ICD-10-CM | POA: Diagnosis not present

## 2015-07-08 DIAGNOSIS — M47816 Spondylosis without myelopathy or radiculopathy, lumbar region: Secondary | ICD-10-CM

## 2015-07-08 DIAGNOSIS — M79604 Pain in right leg: Secondary | ICD-10-CM | POA: Diagnosis present

## 2015-07-08 DIAGNOSIS — M79605 Pain in left leg: Secondary | ICD-10-CM | POA: Diagnosis present

## 2015-07-08 DIAGNOSIS — M51369 Other intervertebral disc degeneration, lumbar region without mention of lumbar back pain or lower extremity pain: Secondary | ICD-10-CM

## 2015-07-08 MED ORDER — TRIAMCINOLONE ACETONIDE 40 MG/ML IJ SUSP
INTRAMUSCULAR | Status: AC
  Administered 2015-07-08: 11:00:00
  Filled 2015-07-08: qty 1

## 2015-07-08 MED ORDER — ORPHENADRINE CITRATE 30 MG/ML IJ SOLN
INTRAMUSCULAR | Status: AC
  Administered 2015-07-08: 11:00:00
  Filled 2015-07-08: qty 2

## 2015-07-08 MED ORDER — BUPIVACAINE HCL (PF) 0.25 % IJ SOLN
INTRAMUSCULAR | Status: AC
  Administered 2015-07-08: 11:00:00
  Filled 2015-07-08: qty 30

## 2015-07-08 MED ORDER — OXYCODONE HCL 5 MG PO TABS: ORAL_TABLET | ORAL | Status: DC

## 2015-07-08 MED ORDER — LIDOCAINE HCL (PF) 1 % IJ SOLN
INTRAMUSCULAR | Status: AC
  Administered 2015-07-08: 11:00:00
  Filled 2015-07-08: qty 5

## 2015-07-08 MED ORDER — MIDAZOLAM HCL 5 MG/5ML IJ SOLN
INTRAMUSCULAR | Status: AC
  Administered 2015-07-08: 3 mg
  Filled 2015-07-08: qty 5

## 2015-07-08 MED ORDER — FENTANYL CITRATE (PF) 100 MCG/2ML IJ SOLN
INTRAMUSCULAR | Status: AC
  Administered 2015-07-08: 100 ug via INTRAVENOUS
  Filled 2015-07-08: qty 2

## 2015-07-08 MED ORDER — CEFAZOLIN SODIUM 1 G IJ SOLR
INTRAMUSCULAR | Status: AC
  Administered 2015-07-08: 1 g via INTRAVENOUS
  Filled 2015-07-08: qty 10

## 2015-07-08 MED ORDER — CEFUROXIME AXETIL 250 MG PO TABS: 250.0000 mg | ORAL_TABLET | Freq: Two times a day (BID) | ORAL | Status: DC

## 2015-07-08 MED ORDER — SODIUM CHLORIDE 0.9 % IJ SOLN
INTRAMUSCULAR | Status: AC
Start: 2015-07-08 — End: 2015-07-08
  Administered 2015-07-08: 11:00:00
  Filled 2015-07-08: qty 20

## 2015-07-08 NOTE — Progress Notes (Signed)
Safety precautions to be maintained throughout the outpatient stay will include: orient to surroundings, keep bed in low position, maintain call bell within reach at all times, provide assistance with transfer out of bed and ambulation.  

## 2015-07-08 NOTE — Patient Instructions (Addendum)
PLAN  Continue present medication oxycodone and begin taking antibiotic Ceftin as prescribed. Please obtain your antibiotic Ceftin today and begin taking antibiotic today  F/U PCP Dr. Jeananne Rama for evaliation of  BP and general medical  condition.  F/U surgical evaluation. Follow-up surgical evaluation as discussed  F/U neurological evaluation. May consider pending follow-up evaluations  May consider radiofrequency rhizolysis or intraspinal procedures pending response to present treatment and F/U evaluation.  Patient to call [facility] Center should patient have concerns prior to scheduled return appointment.  Pain Management Discharge Instructions  General Discharge Instructions :  If you need to reach your doctor call: Monday-Friday 8:00 am - 4:00 pm at [phone] or toll free [phone].  After clinic hours [phone] to have operator reach doctor.  Bring all of your medication bottles to all your appointments in the pain clinic.  To cancel or reschedule your appointment with Pain Management please remember to call 24 hours in advance to avoid a fee.  Refer to the educational materials which you have been given on: General Risks, I had my Procedure. Discharge Instructions, Post Sedation.  Post Procedure Instructions:  The drugs you were given will stay in your system until tomorrow, so for the next 24 hours you should not drive, make any legal decisions or drink any alcoholic beverages.  You may eat anything you prefer, but it is better to start with liquids then soups and crackers, and gradually work up to solid foods.  Please notify your doctor immediately if you have any unusual bleeding, trouble breathing or pain that is not related to your normal pain.  Depending on the type of procedure that was done, some parts of your body may feel week and/or numb.  This usually clears up by tonight or the next day.  Walk with the use of an assistive device or accompanied by  an adult for the 24 hours.  You may use ice on the affected area for the first 24 hours.  Put ice in a Ziploc bag and cover with a towel and place against area 15 minutes on 15 minutes off.  You may switch to heat after 24 hours.Epidural Steroid Injection Patient Information  Description: The epidural space surrounds the nerves as they exit the spinal cord.  In some patients, the nerves can be compressed and inflamed by a bulging disc or a tight spinal canal (spinal stenosis).  By injecting steroids into the epidural space, we can bring irritated nerves into direct contact with a potentially helpful medication.  These steroids act directly on the irritated nerves and can reduce swelling and inflammation which often leads to decreased pain.  Epidural steroids may be injected anywhere along the spine and from the neck to the low back depending upon the location of your pain.   After numbing the skin with local anesthetic (like Novocaine), a small needle is passed into the epidural space slowly.  You may experience a sensation of pressure while this is being done.  The entire block usually last less than 10 minutes.  Conditions which may be treated by epidural steroids:   Low back and leg pain  Neck and arm pain  Spinal stenosis  Post-laminectomy syndrome  Herpes zoster (shingles) pain  Pain from compression fractures  Preparation for the injection:   Do not eat any solid food or dairy products within 6 hours of your appointment.   You may drink clear liquids up to 2 hours before appointment.  Clear liquids include water, black coffee,  juice or soda.  No milk or cream please.  You may take your regular medication, including pain medications, with a sip of water before your appointment  Diabetics should hold regular insulin (if taken separately) and take 1/2 normal NPH dos the morning of the procedure.  Carry some sugar containing items with you to your appointment.  A driver must  accompany you and be prepared to drive you home after your procedure.   Bring all your current medications with your.  An IV may be inserted and sedation may be given at the discretion of the physician.    A blood pressure cuff, EKG and other monitors will often be applied during the procedure.  Some patients may need to have extra oxygen administered for a short period.  You will be asked to provide medical information, including your allergies, prior to the procedure.  We must know immediately if you are taking blood thinners (like Coumadin/Warfarin)  Or if you are allergic to IV iodine contrast (dye). We must know if you could possible be pregnant.  Possible side-effects:  Bleeding from needle site  Infection (rare, may require surgery)  Nerve injury (rare)  Numbness & tingling (temporary)  Difficulty urinating (rare, temporary)  Spinal headache ( a headache worse with upright posture)  Light -headedness (temporary)  Pain at injection site (several days)  Decreased blood pressure (temporary)  Weakness in arm/leg (temporary)  Pressure sensation in back/neck (temporary)     n.    Hives or difficulty breathing (go to the emergency room)    Severe back/neck pain Any new syOral Corticosteroids Oral corticosteroids are medicines used to decrease inflammation in the body. They are often used to treat a specific area of the body. However, their effects occur throughout the body (systemic). These medicines are used to treat conditions including: skin problems, asthma, allergies, cancer-like conditions, and arthritis. Oral corticosteroids should only be taken if prescribed to you by a caregiver. Doses vary from person to person. It may be dangerous to take a prescription that is dosed for someone else. Toward the end of a prescription, the dose is often gradually decreased. WHY ATHLETES USE IT  Many athletes take these medicines to decrease inflammation in the body. Certain  oral corticosteroids may have performance enhancing (ergogenic) effects. Some athletes take them for this reason, but the effects are limited. ADVERSE EFFECTS  Rash. Blurred vision. Increased urination. Unusual thirst. Mood changes. Headache. Indigestion. Increased appetite. Restlessness. Elevated glucose in persons with diabetes. PHARMACOLOGY  Oral steroids can be taken through the mouth, in pill or liquid form. The same type of medicines also come in injectable form, for use in a specific part of the body (localized). Dosing varies from person to person. Doses may be long or short-acting. Toward the end of a prescription, doses are often gradually decreased (tapered). PREVENTION  Corticosteroids of any kind should never be taken without a written prescription and supervision from a caregiver, or without caregiver follow-up. Follow your caregiver's prescription closely. Do not miss a dose or take extra doses. More is not better. If your caregiver has prescribed tapering, follow his or her instructions. Corticosteroids have multiple negative interactions with other drugs. Tell your caregiver about any drugs you are taking. Do not take any new drugs without asking your caregiver. If you develop a serious injury or illness during your prescription, notify your caregiver. Document Released: 10/05/2005 Document Revised: 12/28/2011 Document Reviewed: 01/17/2009 [facility] Patient Information 2015 [place], [place]. This information is not intended to  replace advice given to you by your health care provider. Make sure you discuss any questions you have with your health care provider.  Please note:  Although the local anesthetic injected can often make your back or neck feel good for several hours after the injection, the pain will likely return.  It takes 3-7 days for steroids to work in the epidural space.  You may not notice any pain relief for at least that one week.  If effective, we will often do a  series of three injections spaced 3-6 weeks apart to maximally decrease your pain.  After the initial series, we generally will wait several months before considering a repeat injection of the same type.  If you have any questions, please call [phone] [facility] ClinicConstipation Constipation is when a person has fewer than three bowel movements a week, has difficulty having a bowel movement, or has stools that are dry, hard, or larger than normal. As people grow older, constipation is more common. If you try to fix constipation with medicines that make you have a bowel movement (laxatives), the problem may get worse. Long-term laxative use may cause the muscles of the colon to become weak. A low-fiber diet, not taking in enough fluids, and taking certain medicines may make constipation worse.  CAUSES   Certain medicines, such as antidepressants, pain medicine, iron supplements, antacids, and water pills.   Certain diseases, such as diabetes, irritable bowel syndrome (IBS), thyroid disease, or depression.   Not drinking enough water.   Not eating enough fiber-rich foods.   Stress or travel.   Lack of physical activity or exercise.   Ignoring the urge to have a bowel movement.   Using laxatives too much.  SIGNS AND SYMPTOMS   Having fewer than three bowel movements a week.   Straining to have a bowel movement.   Having stools that are hard, dry, or larger than normal.   Feeling full or bloated.   Pain in the lower abdomen.   Not feeling relief after having a bowel movement.  DIAGNOSIS  Your health care provider will take a medical history and perform a physical exam. Further testing may be done for severe constipation. Some tests may include:  A barium enema X-ray to examine your rectum, colon, and, sometimes, your small intestine.   A sigmoidoscopy to examine your lower colon.   A colonoscopy to examine your entire  colon. TREATMENT  Treatment will depend on the severity of your constipation and what is causing it. Some dietary treatments include drinking more fluids and eating more fiber-rich foods. Lifestyle treatments may include regular exercise. If these diet and lifestyle recommendations do not help, your health care provider may recommend taking over-the-counter laxative medicines to help you have bowel movements. Prescription medicines may be prescribed if over-the-counter medicines do not work.  HOME CARE INSTRUCTIONS   Eat foods that have a lot of fiber, such as fruits, vegetables, whole grains, and beans.  Limit foods high in fat and processed sugars, such as french fries, hamburgers, cookies, candies, and soda.   A fiber supplement may be added to your diet if you cannot get enough fiber from foods.   Drink enough fluids to keep your urine clear or pale yellow.   Exercise regularly or as directed by your health care provider.   Go to the restroom when you have the urge to go. Do not hold it.   Only take over-the-counter or prescription medicines as directed  by your health care provider. Do not take other medicines for constipation without talking to your health care provider first.  [place] IF:   You have bright red blood in your stool.   Your constipation lasts for more than 4 days or gets worse.   You have abdominal or rectal pain.   You have thin, pencil-like stools.   You have unexplained weight loss. MAKE SURE YOU:   Understand these instructions.  Will watch your condition.  Will get help right away if you are not doing well or get worse. Document Released: 07/03/2004 Document Revised: 10/10/2013 Document Reviewed: 07/17/2013 [facility] Patient Information 2015 [place], [place]. This information is not intended to replace advice given to you by your health care provider. Make sure you discuss any questions you have with your health care  provider.

## 2015-07-08 NOTE — Progress Notes (Signed)
   Subjective:    Patient ID: Edward Schneider., male    DOB: June 11, 1956, 59 y.o.   MRN: [id number]  HPI  PROCEDURE PERFORMED: Lumbar epidural steroid injection   NOTE: The patient is a 59 y.o. male who returns to [place] for further evaluation and treatment of pain involving the lumbar and lower extremity region. MRI revealed the patient to be with degenerative disc disease lumbar spine Multilevel degenerative changes lumbar spine with disc protrusion, facet arthropathy, foraminal narrowing, status post lumbar surgery..The patient is with severe lower back lower extremity pain aggravated by standing walking becoming more intense as patient spends time on the feet. There is concern regarding lumbar stenosis with neurogenic claudication contributing to patient's symptomatology The risks, benefits, and expectations of the procedure have been discussed and explained to the patient who was understanding and in agreement with suggested treatment plan. We will proceed with lumbar epidural steroid injection as discussed and as explained to the patient who is willing to proceed with procedure as planned.   DESCRIPTION OF PROCEDURE: Lumbar epidural steroid injection with IV Versed, IV fentanyl conscious sedation, EKG, blood pressure, pulse, and pulse oximetry monitoring. The procedure was performed with the patient in the prone position under fluoroscopic guidance. A local anesthetic skin wheal of 1.5% plain lidocaine was accomplished at proposed entry site. An 18-gauge Tuohy epidural needle was inserted at the L 4 vertebral body level left of the midline via loss-of-resistance technique with negative heme and negative CSF return. A total of 4 mL of Preservative-Free normal saline with 40 mg of Kenalog injected incrementally via epidurally placed needle. Needle was removed.  Myoneural block injections of the lumbar musculature region. Following Betadine prep of proposeda 22-gauge needle was  inserted in the lumbar musculature region and following negative aspiration 2 cc of 0.25% bupivacaine with Norflex was injected for myoneural block injection 4    A total of 40 mg of Kenalog was utilized for the procedure.   The patient tolerated the injection well.    PLAN:   1. Medications: We will continue presently prescribed medications oxycodone. 2. Will consider modification of treatment regimen pending response to treatment rendered on today's visit and follow-up evaluation. 3. The patient is to follow-up with primary care physician Dr. Jeananne Rama regarding blood pressure and general medical condition status post lumbar epidural steroid injection performed on today's visit. 4. Surgical evaluation.Patient will undergo follow-up evaluation as discussed 5. Neurological evaluation May consider further evaluation as discussed 6. The patient may be a candidate for radiofrequency procedures, implantation device, and other treatment pending response to treatment and follow-up evaluation. 7. The patient has been advised to adhere to proper body mechanics and avoid activities which appear to aggravate condition. 8. The patient has been advised to call the Pain Management Center prior to scheduled return appointment should there be significant change in condition or should there be sign  The patient is understanding and agrees with the suggested  treatment plan       Review of Systems     Objective:   Physical Exam        Assessment & Plan:

## 2015-07-09 ENCOUNTER — Telehealth: Payer: Self-pay | Admitting: *Deleted

## 2015-07-09 NOTE — Telephone Encounter (Signed)
Left voice mail

## 2015-07-16 ENCOUNTER — Other Ambulatory Visit: Payer: Self-pay | Admitting: Pain Medicine

## 2015-07-20 DIAGNOSIS — Z8673 Personal history of transient ischemic attack (TIA), and cerebral infarction without residual deficits: Secondary | ICD-10-CM | POA: Diagnosis not present

## 2015-07-20 DIAGNOSIS — M2578 Osteophyte, vertebrae: Secondary | ICD-10-CM | POA: Diagnosis not present

## 2015-07-20 DIAGNOSIS — R1110 Vomiting, unspecified: Secondary | ICD-10-CM | POA: Diagnosis not present

## 2015-07-20 DIAGNOSIS — K8689 Other specified diseases of pancreas: Secondary | ICD-10-CM | POA: Diagnosis not present

## 2015-07-20 DIAGNOSIS — R002 Palpitations: Secondary | ICD-10-CM | POA: Diagnosis not present

## 2015-07-20 DIAGNOSIS — Z79891 Long term (current) use of opiate analgesic: Secondary | ICD-10-CM | POA: Diagnosis not present

## 2015-07-20 DIAGNOSIS — R079 Chest pain, unspecified: Secondary | ICD-10-CM | POA: Diagnosis not present

## 2015-07-20 DIAGNOSIS — Z87891 Personal history of nicotine dependence: Secondary | ICD-10-CM | POA: Diagnosis not present

## 2015-07-20 DIAGNOSIS — Z8669 Personal history of other diseases of the nervous system and sense organs: Secondary | ICD-10-CM | POA: Diagnosis not present

## 2015-07-20 DIAGNOSIS — G8929 Other chronic pain: Secondary | ICD-10-CM | POA: Diagnosis not present

## 2015-07-20 DIAGNOSIS — R61 Generalized hyperhidrosis: Secondary | ICD-10-CM | POA: Diagnosis not present

## 2015-07-20 DIAGNOSIS — Z791 Long term (current) use of non-steroidal anti-inflammatories (NSAID): Secondary | ICD-10-CM | POA: Diagnosis not present

## 2015-07-20 DIAGNOSIS — Z7982 Long term (current) use of aspirin: Secondary | ICD-10-CM | POA: Diagnosis not present

## 2015-07-20 DIAGNOSIS — Z981 Arthrodesis status: Secondary | ICD-10-CM | POA: Diagnosis not present

## 2015-07-20 DIAGNOSIS — R1310 Dysphagia, unspecified: Secondary | ICD-10-CM | POA: Diagnosis not present

## 2015-07-20 DIAGNOSIS — R1013 Epigastric pain: Secondary | ICD-10-CM | POA: Diagnosis not present

## 2015-07-20 DIAGNOSIS — K760 Fatty (change of) liver, not elsewhere classified: Secondary | ICD-10-CM | POA: Diagnosis not present

## 2015-07-20 DIAGNOSIS — R001 Bradycardia, unspecified: Secondary | ICD-10-CM | POA: Diagnosis not present

## 2015-07-20 DIAGNOSIS — R42 Dizziness and giddiness: Secondary | ICD-10-CM | POA: Diagnosis not present

## 2015-07-20 DIAGNOSIS — R51 Headache: Secondary | ICD-10-CM | POA: Diagnosis not present

## 2015-07-20 DIAGNOSIS — I2510 Atherosclerotic heart disease of native coronary artery without angina pectoris: Secondary | ICD-10-CM | POA: Diagnosis not present

## 2015-07-20 DIAGNOSIS — R55 Syncope and collapse: Secondary | ICD-10-CM | POA: Diagnosis not present

## 2015-07-20 DIAGNOSIS — M549 Dorsalgia, unspecified: Secondary | ICD-10-CM | POA: Diagnosis not present

## 2015-07-20 DIAGNOSIS — E785 Hyperlipidemia, unspecified: Secondary | ICD-10-CM | POA: Diagnosis not present

## 2015-07-20 DIAGNOSIS — K52831 Collagenous colitis: Secondary | ICD-10-CM | POA: Diagnosis not present

## 2015-07-20 DIAGNOSIS — I10 Essential (primary) hypertension: Secondary | ICD-10-CM | POA: Diagnosis not present

## 2015-07-20 DIAGNOSIS — M961 Postlaminectomy syndrome, not elsewhere classified: Secondary | ICD-10-CM | POA: Diagnosis not present

## 2015-07-20 DIAGNOSIS — R0602 Shortness of breath: Secondary | ICD-10-CM | POA: Diagnosis not present

## 2015-07-21 DIAGNOSIS — R55 Syncope and collapse: Secondary | ICD-10-CM | POA: Diagnosis not present

## 2015-07-21 DIAGNOSIS — R51 Headache: Secondary | ICD-10-CM | POA: Diagnosis not present

## 2015-07-21 DIAGNOSIS — G8929 Other chronic pain: Secondary | ICD-10-CM | POA: Diagnosis not present

## 2015-07-29 DIAGNOSIS — H524 Presbyopia: Secondary | ICD-10-CM | POA: Diagnosis not present

## 2015-07-29 DIAGNOSIS — H5203 Hypermetropia, bilateral: Secondary | ICD-10-CM | POA: Diagnosis not present

## 2015-07-30 ENCOUNTER — Encounter: Payer: Medicare Other | Admitting: Family Medicine

## 2015-08-06 ENCOUNTER — Ambulatory Visit: Payer: Medicare Other | Attending: Pain Medicine | Admitting: Pain Medicine

## 2015-08-06 ENCOUNTER — Encounter: Payer: Self-pay | Admitting: Pain Medicine

## 2015-08-06 VITALS — BP 128/59 | HR 59 | Temp 98.1°F | Resp 14 | Ht 68.0 in | Wt 181.0 lb

## 2015-08-06 DIAGNOSIS — M545 Low back pain: Secondary | ICD-10-CM | POA: Diagnosis present

## 2015-08-06 DIAGNOSIS — G90523 Complex regional pain syndrome I of lower limb, bilateral: Secondary | ICD-10-CM | POA: Diagnosis not present

## 2015-08-06 DIAGNOSIS — M5030 Other cervical disc degeneration, unspecified cervical region: Secondary | ICD-10-CM

## 2015-08-06 DIAGNOSIS — Z9889 Other specified postprocedural states: Secondary | ICD-10-CM

## 2015-08-06 DIAGNOSIS — M79604 Pain in right leg: Secondary | ICD-10-CM | POA: Diagnosis present

## 2015-08-06 DIAGNOSIS — M47896 Other spondylosis, lumbar region: Secondary | ICD-10-CM | POA: Insufficient documentation

## 2015-08-06 DIAGNOSIS — M5136 Other intervertebral disc degeneration, lumbar region: Secondary | ICD-10-CM | POA: Diagnosis not present

## 2015-08-06 DIAGNOSIS — M5481 Occipital neuralgia: Secondary | ICD-10-CM

## 2015-08-06 DIAGNOSIS — M5416 Radiculopathy, lumbar region: Secondary | ICD-10-CM

## 2015-08-06 DIAGNOSIS — M47816 Spondylosis without myelopathy or radiculopathy, lumbar region: Secondary | ICD-10-CM

## 2015-08-06 DIAGNOSIS — M79605 Pain in left leg: Secondary | ICD-10-CM | POA: Diagnosis present

## 2015-08-06 DIAGNOSIS — M791 Myalgia: Secondary | ICD-10-CM | POA: Diagnosis not present

## 2015-08-06 DIAGNOSIS — M533 Sacrococcygeal disorders, not elsewhere classified: Secondary | ICD-10-CM | POA: Diagnosis not present

## 2015-08-06 DIAGNOSIS — M47812 Spondylosis without myelopathy or radiculopathy, cervical region: Secondary | ICD-10-CM

## 2015-08-06 DIAGNOSIS — M792 Neuralgia and neuritis, unspecified: Secondary | ICD-10-CM

## 2015-08-06 DIAGNOSIS — Z7982 Long term (current) use of aspirin: Secondary | ICD-10-CM

## 2015-08-06 DIAGNOSIS — M47817 Spondylosis without myelopathy or radiculopathy, lumbosacral region: Secondary | ICD-10-CM | POA: Diagnosis not present

## 2015-08-06 DIAGNOSIS — G588 Other specified mononeuropathies: Secondary | ICD-10-CM | POA: Insufficient documentation

## 2015-08-06 DIAGNOSIS — G609 Hereditary and idiopathic neuropathy, unspecified: Secondary | ICD-10-CM

## 2015-08-06 DIAGNOSIS — M961 Postlaminectomy syndrome, not elsewhere classified: Secondary | ICD-10-CM

## 2015-08-06 DIAGNOSIS — Z981 Arthrodesis status: Secondary | ICD-10-CM

## 2015-08-06 MED ORDER — OXYCODONE HCL 5 MG PO TABS: ORAL_TABLET | ORAL | Status: DC

## 2015-08-06 NOTE — Patient Instructions (Addendum)
PLAN   Continue present medication oxycodone  Lumbosacral selective nerve root block to be performed at time return appointment  F/U PCP Dr. Jeananne Rama for evaliation of  BP and general medical  Condition  F/U surgical evaluation. May consider pending follow-up evaluations  F/U neurological evaluation. May consider pending follow-up evaluations  May consider radiofrequency rhizolysis or intraspinal procedures pending response to present treatment and F/U evaluation   Patient to call [facility] should patient have concerns prior to scheduled return appointment. Selective Nerve Root Block Patient Information  Description: Specific nerve roots exit the spinal canal and these nerves can be compressed and inflamed by a bulging disc and bone spurs.  By injecting steroids on the nerve root, we can potentially decrease the inflammation surrounding these nerves, which often leads to decreased pain.  Also, by injecting local anesthesia on the nerve root, this can provide [place] helpful information to give to your referring doctor if it decreases your pain.  Selective nerve root blocks can be done along the spine from the neck to the low back depending on the location of your pain.   After numbing the skin with local anesthesia, a small needle is passed to the nerve root and the position of the needle is verified using x-ray pictures.  After the needle is in correct position, we then deposit the medication.  You may experience a pressure sensation while this is being done.  The entire block usually lasts less than 15 minutes.  Conditions that may be treated with selective nerve root blocks:  Low back and leg pain  Spinal stenosis  Diagnostic block prior to potential surgery  Neck and arm pain  Post laminectomy syndrome  Preparation for the injection:  1. Do not eat any solid food or dairy products within 6 hours of your appointment. 2. You may drink clear liquids up to 2 hours before  an appointment.  Clear liquids include water, black coffee, juice or soda.  No milk or cream please. 3. You may take your regular medications, including pain medications, with a sip of water before your appointment.  Diabetics should hold regular insulin (if taken separately) and take 1/2 normal NPH dose the morning of the procedure.  Carry some sugar containing items with you to your appointment. 4. A driver must accompany you and be prepared to drive you home after your procedure. 5. Bring all your current medications with you. 6. An IV may be inserted and sedation may be given at the discretion of the physician. 7. A blood pressure cuff, EKG, and other monitors will often be applied during the procedure.  Some patients may need to have extra oxygen administered for a short period. 8. You will be asked to provide medical information, including allergies, prior to the procedure.  We must know immediately if you are taking blood  Thinners (like Coumadin) or if you are allergic to IV iodine contrast (dye).  Possible side-effects: All are usually temporary  Bleeding from needle site  Light headedness  Numbness and tingling  Decreased blood pressure  Weakness in arms/legs  Pressure sensation in back/neck  Pain at injection site (several days)  Possible complications: All are extremely rare  Infection  Nerve injury  Spinal headache (a headache wore with upright position)  Call if you experience:  Fever/chills associated with headache or increased back/neck pain  Headache worsened by an upright position  New onset weakness or numbness of an extremity below the injection site  Hives or difficulty  breathing (go to the emergency room)  Inflammation or drainage at the injection site(s)  Severe back/neck pain greater than usual  New symptoms which are concerning to you  Please note:  Although the local anesthetic injected can often make your back or neck feel good for  several hours after the injection the pain will likely return.  It takes 3-5 days for steroids to work on the nerve root. You may not notice any pain relief for at least one week.  If effective, we will often do a series of 3 injections spaced 3-6 weeks apart to maximally decrease your pain.    If you have any questions, please call [phone] [facility] Clinic[facility] R[facility]KS AND COMPLICATIONS  What are the risk, side effects and possible complications? Generally speaking, most procedures are safe.  However, with any procedure there are risks, side effects, and the possibility of complications.  The risks and complications are dependent upon the sites that are lesioned, or the type of nerve block to be performed.  The closer the procedure is to the spine, the more serious the risks are.  Great care is taken when placing the radio frequency needles, block needles or lesioning probes, but sometimes complications can occur. 1. Infection: Any time there is an injection through the skin, there is a risk of infection.  This is why sterile conditions are used for these blocks.  There are four possible types of infection. 1. Localized skin infection. 2. Central Nervous System Infection-This can be in the form of Meningitis, which can be deadly. 3. Epidural Infections-This can be in the form of an epidural abscess, which can cause pressure inside of the spine, causing compression of the spinal cord with subsequent paralysis. This would require an emergency surgery to decompress, and there are no guarantees that the patient would recover from the paralysis. 4. Discitis-This is an infection of the intervertebral discs.  It occurs in about 1% of discography procedures.  It is difficult to treat and it may lead to surgery.        2. Pain: the needles have to go through skin and soft tissues, will cause soreness.       3. Damage to internal structures:  The nerves to be lesioned may be  near blood vessels or    other nerves which can be potentially damaged.       4. Bleeding: Bleeding is more common if the patient is taking blood thinners such as  aspirin, Coumadin, Ticiid, Plavix, etc., or if he/she have some genetic predisposition  such as hemophilia. Bleeding into the spinal canal can cause compression of the spinal  cord with subsequent paralysis.  This would require an emergency surgery to  decompress and there are no guarantees that the patient would recover from the  paralysis.       5. Pneumothorax:  Puncturing of a lung is a possibility, every time a needle is introduced in  the area of the chest or upper back.  Pneumothorax refers to free air around the  collapsed lung(s), inside of the thoracic cavity (chest cavity).  Another two possible  complications related to a similar event would include: Hemothorax and Chylothorax.   These are variations of the Pneumothorax, where instead of air around the collapsed  lung(s), you may have blood or chyle, respectively.       6. Spinal headaches: They may occur with any procedures in the area of the spine.  7. Persistent CSF (Cerebro-Spinal Fluid) leakage: This is a rare problem, but may occur  with prolonged intrathecal or epidural catheters either due to the formation of a fistulous  track or a dural tear.       8. Nerve damage: By working so close to the spinal cord, there is always a possibility of  nerve damage, which could be as serious as a permanent spinal cord injury with  paralysis.       9. Death:  Although rare, severe deadly allergic reactions known as "Anaphylactic  reaction" can occur to any of the medications used.      10. Worsening of the symptoms:  We can always make thing worse.  What are the chances of something like this happening? Chances of any of this occuring are extremely low.  By statistics, you have more of a chance of getting killed in a motor vehicle accident: while driving to the hospital than any of  the above occurring .  Nevertheless, you should be aware that they are possibilities.  In general, it is similar to taking a shower.  Everybody knows that you can slip, hit your head and get killed.  Does that mean that you should not shower again?  Nevertheless always keep in mind that statistics do not mean anything if you happen to be on the wrong side of them.  Even if a procedure has a 1 (one) in a 1,000,000 (million) chance of going wrong, it you happen to be that one..Also, keep in mind that by statistics, you have more of a chance of having something go wrong when taking medications.  Who should not have this procedure? If you are on a blood thinning medication (e.g. Coumadin, Plavix, see list of "Blood Thinners"), or if you have an active infection going on, you should not have the procedure.  If you are taking any blood thinners, please inform your physician.  How should I prepare for this procedure?  Do not eat or drink anything at least six hours prior to the procedure.  Bring a driver with you .  It cannot be a taxi.  Come accompanied by an adult that can drive you back, and that is strong enough to help you if your legs get weak or numb from the local anesthetic.  Take all of your medicines the morning of the procedure with just enough water to swallow them.  If you have diabetes, make sure that you are scheduled to have your procedure done first thing in the morning, whenever possible.  If you have diabetes, take only half of your insulin dose and notify our nurse that you have done so as soon as you arrive at the clinic.  If you are diabetic, but only take blood sugar pills (oral hypoglycemic), then do not take them on the morning of your procedure.  You may take them after you have had the procedure.  Do not take aspirin or any aspirin-containing medications, at least eleven (11) days prior to the procedure.  They may prolong bleeding.  Wear loose fitting clothing that may be  easy to take off and that you would not mind if it got stained with Betadine or blood.  Do not wear any jewelry or perfume  Remove any nail coloring.  It will interfere with some of our monitoring equipment.  NOTE: Remember that this is not meant to be interpreted as a complete list of all possible complications.  Unforeseen problems may occur.  BLOOD THINNERS  The following drugs contain aspirin or other products, which can cause increased bleeding during surgery and should not be taken for 2 weeks prior to and 1 week after surgery.  If you should need take something for relief of minor pain, you may take acetaminophen which is found in Tylenol,m Datril, Anacin-3 and Panadol. It is not blood thinner. The products listed below are.  Do not take any of the products listed below in addition to any listed on your instruction sheet.  A.P.C or A.P.C with Codeine Codeine Phosphate Capsules #3 Ibuprofen Ridaura  ABC compound Congesprin Imuran rimadil  Advil Cope Indocin Robaxisal  Alka-Seltzer Effervescent Pain Reliever and Antacid Coricidin or Coricidin-D  Indomethacin Rufen  Alka-Seltzer plus Cold Medicine Cosprin Ketoprofen S-A-C Tablets  Anacin Analgesic Tablets or Capsules Coumadin Korlgesic Salflex  Anacin Extra Strength Analgesic tablets or capsules CP-2 Tablets Lanoril Salicylate  Anaprox Cuprimine Capsules Levenox Salocol  Anexsia-D Dalteparin Magan Salsalate  Anodynos Darvon compound Magnesium Salicylate Sine-off  Ansaid Dasin Capsules Magsal Sodium Salicylate  Anturane Depen Capsules Marnal Soma  APF Arthritis pain formula Dewitt's Pills Measurin Stanback  Argesic Dia-Gesic Meclofenamic Sulfinpyrazone  Arthritis Bayer Timed Release Aspirin Diclofenac Meclomen Sulindac  Arthritis pain formula Anacin Dicumarol Medipren Supac  Analgesic (Safety coated) Arthralgen Diffunasal Mefanamic Suprofen  Arthritis Strength Bufferin Dihydrocodeine Mepro Compound Suprol  Arthropan liquid  Dopirydamole Methcarbomol with Aspirin Synalgos  ASA tablets/Enseals Disalcid Micrainin Tagament  Ascriptin Doan's Midol Talwin  Ascriptin A/D Dolene Mobidin Tanderil  Ascriptin Extra Strength Dolobid Moblgesic Ticlid  Ascriptin with Codeine Doloprin or Doloprin with Codeine Momentum Tolectin  Asperbuf Duoprin Mono-gesic Trendar  Aspergum Duradyne Motrin or Motrin IB Triminicin  Aspirin plain, buffered or enteric coated Durasal Myochrisine Trigesic  Aspirin Suppositories Easprin Nalfon Trillsate  Aspirin with Codeine Ecotrin Regular or Extra Strength Naprosyn Uracel  Atromid-S Efficin Naproxen Ursinus  Auranofin Capsules Elmiron Neocylate Vanquish  Axotal Emagrin Norgesic Verin  Azathioprine Empirin or Empirin with Codeine Normiflo Vitamin E  Azolid Emprazil Nuprin Voltaren  Bayer Aspirin plain, buffered or children's or timed BC Tablets or powders Encaprin Orgaran Warfarin Sodium  Buff-a-Comp Enoxaparin Orudis Zorpin  Buff-a-Comp with Codeine Equegesic Os-Cal-Gesic   Buffaprin Excedrin plain, buffered or Extra Strength Oxalid   Bufferin Arthritis Strength Feldene Oxphenbutazone   Bufferin plain or Extra Strength Feldene Capsules Oxycodone with Aspirin   Bufferin with Codeine Fenoprofen Fenoprofen Pabalate or Pabalate-SF   Buffets II Flogesic Panagesic   Buffinol plain or Extra Strength Florinal or Florinal with Codeine Panwarfarin   Buf-Tabs Flurbiprofen Penicillamine   Butalbital Compound Four-way cold tablets Penicillin   Butazolidin Fragmin Pepto-Bismol   Carbenicillin Geminisyn Percodan   Carna Arthritis Reliever Geopen Persantine   Carprofen Gold's salt Persistin   Chloramphenicol Goody's Phenylbutazone   Chloromycetin Haltrain Piroxlcam   Clmetidine heparin Plaquenil   Cllnoril Hyco-pap Ponstel   Clofibrate Hydroxy chloroquine Propoxyphen         Before stopping any of these medications, be sure to consult the physician who ordered them.  Some, such as Coumadin (Warfarin)  are ordered to prevent or treat serious conditions such as "deep thrombosis", "pumonary embolisms", and other heart problems.  The amount of time that you may need off of the medication may also vary with the medication and the reason for which you were taking it.  If you are taking any of these medications, please make sure you notify your pain physician before you undergo any procedures.

## 2015-08-06 NOTE — Progress Notes (Signed)
Safety precautions to be maintained throughout the outpatient stay will include: orient to surroundings, keep bed in low position, maintain call bell within reach at all times, provide assistance with transfer out of bed and ambulation.  

## 2015-08-06 NOTE — Progress Notes (Signed)
   Subjective:    Patient ID: Edward Schneider., male    DOB: 1956/02/22, 59 y.o.   MRN: [id number]  HPI  Patient is 59 year old gentleman who [facility] a [facility] for further evaluation and treatment of pain involving the lower back and lower extremity region. Patient states that his predominant complaint is burning sensation of the lower extremities. Patient states that the pain is present throughout the day and interferes with activities of daily living to significant degree. We discussed patient's overall condition and will proceed with lumbosacral selective nerve root block at time return appointment. There is concern regarding possible sympathetic mediated pain and sympathetic independent pain which may be contributing to patient's symptomatology. We will proceed with lumbosacral selective nerve root block which is felt to be medically necessary procedure in an attempt to decrease severity of patient's symptoms, minimize progression of symptoms, and avoid need for more involved treatment. Patient is status post lumbar sympathetic block and we will proceed with lumbosacral selective nerve root block at time of return appointment to see if patient may benefit more significantly since there is concern regarding both sympathetic component of pain and somatic component of patient's pain.  Review of Systems     Objective:   Physical Exam  There was tends to palpation of paraspinal muscles and cervical and cervical facet region a minimal degree. There was minimal tenderness of the splenius capitis and occipitalis region. Palpation over the cervical facet cervical paraspinal musculature region was without increased pain of significant degree. There was minimal tenderness of the acromioclavicular and glenohumeral joint regions. There appeared to be unremarkable Spurling's maneuver. Patient was with bilaterally equal grip strength. Tinel and Phalen's maneuver were without increased pain of  significant degree. The thoracic region noted. There was minimal tenderness to palpation of the thoracic paraspinal musculature region. Palpation over the lumbar paraspinal musculature region lumbar facet region was tends to palpation of moderate degree. There was moderate increased pain with lateral bending and rotation and extension and palpation of the lumbar facets. Palpation of the PSIS and PII S region reproduced mild to moderate discomfort. There was no definite sensory deficit of dermatomal distribution of the lower extremities. There was increased sensitivity to touch of the lower extremities was negative clonus negative Homans. EHL strength appeared to be decreased. There was minimal tenderness of the greater trochanteric region and iliotibial band region. Abdomen was nontender with no costovertebral angle tenderness noted.      Assessment & Plan:  Complex regional pain syndrome of the lower extremities  Lumbar radiculopathy  Degenerative disc disease lumbar spine Multilevel degenerative changes lumbar spine with disc protrusion, facet arthropathy, foraminal narrowing, status post lumbar surgery.  Lumbar facet syndrome  Neuralgia of the lower extremities    PLAN   Continue present medication oxycodone  Lumbosacral selective nerve root block to be performed at time return appointment  F/U PCP Dr. Jeananne Rama for evaliation of  BP and general medical  condition  F/U surgical evaluation as discussed  F/U neurological evaluation. May consider pending follow-up evaluations  May consider radiofrequency rhizolysis or intraspinal procedures pending response to present treatment and F/U evaluation   Patient to call [facility] should patient have concerns prior to scheduled return appointment.

## 2015-08-12 ENCOUNTER — Encounter: Payer: Self-pay | Admitting: Pain Medicine

## 2015-08-12 ENCOUNTER — Ambulatory Visit: Payer: Medicare Other | Attending: Pain Medicine | Admitting: Pain Medicine

## 2015-08-12 VITALS — BP 105/62 | HR 52 | Temp 98.1°F | Resp 16 | Ht 68.0 in | Wt 183.0 lb

## 2015-08-12 DIAGNOSIS — M5136 Other intervertebral disc degeneration, lumbar region: Secondary | ICD-10-CM | POA: Insufficient documentation

## 2015-08-12 DIAGNOSIS — Z9889 Other specified postprocedural states: Secondary | ICD-10-CM

## 2015-08-12 DIAGNOSIS — M961 Postlaminectomy syndrome, not elsewhere classified: Secondary | ICD-10-CM

## 2015-08-12 DIAGNOSIS — M5481 Occipital neuralgia: Secondary | ICD-10-CM

## 2015-08-12 DIAGNOSIS — M5416 Radiculopathy, lumbar region: Secondary | ICD-10-CM

## 2015-08-12 DIAGNOSIS — M545 Low back pain: Secondary | ICD-10-CM | POA: Insufficient documentation

## 2015-08-12 DIAGNOSIS — M79606 Pain in leg, unspecified: Secondary | ICD-10-CM | POA: Insufficient documentation

## 2015-08-12 DIAGNOSIS — Z981 Arthrodesis status: Secondary | ICD-10-CM

## 2015-08-12 DIAGNOSIS — M47816 Spondylosis without myelopathy or radiculopathy, lumbar region: Secondary | ICD-10-CM

## 2015-08-12 DIAGNOSIS — M51369 Other intervertebral disc degeneration, lumbar region without mention of lumbar back pain or lower extremity pain: Secondary | ICD-10-CM

## 2015-08-12 DIAGNOSIS — M5126 Other intervertebral disc displacement, lumbar region: Secondary | ICD-10-CM | POA: Insufficient documentation

## 2015-08-12 DIAGNOSIS — M792 Neuralgia and neuritis, unspecified: Secondary | ICD-10-CM

## 2015-08-12 DIAGNOSIS — M533 Sacrococcygeal disorders, not elsewhere classified: Secondary | ICD-10-CM

## 2015-08-12 DIAGNOSIS — M47812 Spondylosis without myelopathy or radiculopathy, cervical region: Secondary | ICD-10-CM

## 2015-08-12 DIAGNOSIS — G609 Hereditary and idiopathic neuropathy, unspecified: Secondary | ICD-10-CM

## 2015-08-12 DIAGNOSIS — M5030 Other cervical disc degeneration, unspecified cervical region: Secondary | ICD-10-CM

## 2015-08-12 DIAGNOSIS — Z7982 Long term (current) use of aspirin: Secondary | ICD-10-CM

## 2015-08-12 MED ORDER — LIDOCAINE HCL (PF) 1 % IJ SOLN: 10.0000 mL | Freq: Once | INTRAMUSCULAR | Status: DC

## 2015-08-12 MED ORDER — ORPHENADRINE CITRATE 30 MG/ML IJ SOLN: 60.0000 mg | Freq: Once | INTRAMUSCULAR | Status: DC

## 2015-08-12 MED ORDER — TRIAMCINOLONE ACETONIDE 40 MG/ML IJ SUSP
INTRAMUSCULAR | Status: AC
  Administered 2015-08-12: 40 mg
  Filled 2015-08-12: qty 1

## 2015-08-12 MED ORDER — BUPIVACAINE HCL (PF) 0.25 % IJ SOLN
30.0000 mL | Freq: Once | INTRAMUSCULAR | Status: AC
  Administered 2015-08-12: 30 mL

## 2015-08-12 MED ORDER — ORPHENADRINE CITRATE 30 MG/ML IJ SOLN
INTRAMUSCULAR | Status: AC
  Filled 2015-08-12: qty 2

## 2015-08-12 MED ORDER — LACTATED RINGERS IV SOLN: 1000.0000 mL | INTRAVENOUS | Status: DC

## 2015-08-12 MED ORDER — CEFUROXIME AXETIL 250 MG PO TABS: 250.0000 mg | ORAL_TABLET | Freq: Two times a day (BID) | ORAL | Status: DC

## 2015-08-12 MED ORDER — FENTANYL CITRATE (PF) 100 MCG/2ML IJ SOLN
INTRAMUSCULAR | Status: AC
  Administered 2015-08-12: 100 ug via INTRAVENOUS
  Filled 2015-08-12: qty 2

## 2015-08-12 MED ORDER — TRIAMCINOLONE ACETONIDE 40 MG/ML IJ SUSP
40.0000 mg | Freq: Once | INTRAMUSCULAR | Status: AC
  Administered 2015-08-12: 40 mg

## 2015-08-12 MED ORDER — BUPIVACAINE HCL (PF) 0.25 % IJ SOLN
INTRAMUSCULAR | Status: AC
  Administered 2015-08-12: 30 mL
  Filled 2015-08-12: qty 30

## 2015-08-12 MED ORDER — CEFAZOLIN SODIUM 1-5 GM-% IV SOLN: 1.0000 g | Freq: Once | INTRAVENOUS | Status: DC

## 2015-08-12 MED ORDER — CEFAZOLIN SODIUM 1 G IJ SOLR
INTRAMUSCULAR | Status: AC
  Administered 2015-08-12: 1 g via INTRAVENOUS
  Filled 2015-08-12: qty 10

## 2015-08-12 MED ORDER — MIDAZOLAM HCL 5 MG/5ML IJ SOLN
INTRAMUSCULAR | Status: AC
  Administered 2015-08-12: 3 mg via INTRAVENOUS
  Filled 2015-08-12: qty 5

## 2015-08-12 MED ORDER — MIDAZOLAM HCL 5 MG/5ML IJ SOLN
5.0000 mg | Freq: Once | INTRAMUSCULAR | Status: AC
  Administered 2015-08-12: 3 mg via INTRAVENOUS

## 2015-08-12 MED ORDER — FENTANYL CITRATE (PF) 100 MCG/2ML IJ SOLN
100.0000 ug | Freq: Once | INTRAMUSCULAR | Status: AC
  Administered 2015-08-12: 100 ug via INTRAVENOUS

## 2015-08-12 NOTE — Progress Notes (Signed)
Safety precautions to be maintained throughout the outpatient stay will include: orient to surroundings, keep bed in low position, maintain call bell within reach at all times, provide assistance with transfer out of bed and ambulation.  

## 2015-08-12 NOTE — Progress Notes (Signed)
Subjective:    Patient ID: Edward Schneider., male    DOB: 01-Jul-1956, 59 y.o.   MRN: [id number]  HPI PROCEDURE PERFORMED: Lumbosacral selective nerve root block   NOTE: The patient is a 59 y.o. male who returns to [facility] for further evaluation and treatment of pain involving the lumbar and lower extremity region. Studies consisting of  MRI has revealed the patient to be with evidence of degenerative disc disease lumbar spine  with multilevel degenerative changes lumbar spine with disc protrusion, facet arthropathy, foraminal narrowing, status post lumbar surgery.. There is concern regarding intraspinal abnormalities contributing to the patient's symptomatology. There is concern regarding significant component of patient's pain being due to lumbar radiculopathy. There is also being concern regarding component of patient's pain being due to complex regional pain syndrome with sympathetically mediated pain as well as sympathetic independent pain. We will proceed with lumbosacral selective nerve root block in attempt to decrease severity of patient's symptoms, minimize progression of patient's symptoms and avoid need for more involved treatment. The lumbosacral selective nerve root block we'll treat both synthetic mediated pain and sympathetic independent pain as well as treatment lumbar radiculopathy The risks, benefits, and expectations of the procedure have been explained to the patient who was understanding and in agreement with suggested treatment plan. We will proceed with interventional treatment as discussed and as explained to the patient. The patient is understanding and in agreement with suggested treatment plan.   DESCRIPTION OF PROCEDURE: Lumbosacral selective nerve root block with IV Versed, IV fentanyl conscious sedation, EKG, blood pressure, pulse, and pulse oximetry monitoring. The procedure was performed with the patient in the prone position under fluoroscopic guidance.  With the patient in the prone position, Betadine prep of proposed entry site was performed. Local anesthetic skin wheal of proposed needle entry site was prepared with 1.5% plain lidocaine with AP view of the lumbosacral spine.   PROCEDURE #1: Needle placement at the   right L 2 vertebral body: A 22 -gauge needle was inserted at the inferior border of the transverse process of the vertebral body with needle placed medial to the midline of the transverse process on AP view of the lumbosacral spine.   NEEDLE PLACEMENT AT   L3, L4 , and L5  VERTEBRAL BODY LEVELS  Needle  placement was accomplished at  L3 , L4, and L5  vertebral body levels on the  right side exactly as was accomplished at the  L2  vertebral body level  and utilizing the same technique and under fluoroscopic guidance.   Needle placement was then verified on lateral view at all levels with needle tip documented to be in the posterior superior quadrant of the intervertebral foramen of  L  2, L3, L4, and L5. Following negative aspiration for heme and CSF at each level, each level was injected with 3 mL of 0.25% bupivacaine with Kenalog.    The patient tolerated the procedure well. A total of 10 mg of Kenalog was utilized for the procedure.   PLAN:  1. Medications: Will continue presently prescribed medication  oxycodone 2. The patient is to undergo follow-up evaluation with PCP  Dr. Jeananne Rama for evaluation of blood pressure and general medical condition status post procedure performed on today's visit. 3. Surgical follow-up evaluation. Surgical evaluation is been addressed and may consider additional surgical evaluation as discussed 4. Neurological evaluation. May consider [facility] EMG studies and other studies as discussed 5. May consider radiofrequency procedures, implantation type procedures and  other treatment pending response to treatment and follow-up evaluation. 6. The patient has been advise do adhere to proper body mechanics and  avoid activities which may aggravate condition. 7. The patient has been advised to call the [facility] Management Center prior to scheduled return appointment should there be significant change in the patient's condition or should the patient have other concerns regarding condition prior to scheduled return appointment.    Review of Systems     Objective:   Physical Exam        Assessment & Plan:

## 2015-08-12 NOTE — Patient Instructions (Addendum)
Continue present medication  oxycodone and begin taking antibiotics Ceftin today  F/U PCP Dr. Jeananne Rama for evaliation of  BP and general medical  condition  F/U surgical evaluation  F/U neurological evaluation  May consider radiofrequency rhizolysis or intraspinal procedures pending response to present treatment and F/U evaluation   Patient to call Pain Management Center should patient have concerns prior to scheduled return appointmenPain Management Discharge Instructions  General Discharge Instructions :  If you need to reach your doctor call: Monday-Friday 8:00 am - 4:00 pm at [phone] or toll free [phone].  After clinic hours [phone] to have operator reach doctor.  Bring all of your medication bottles to all your appointments in the pain clinic.  To cancel or reschedule your appointment with Pain Management please remember to call 24 hours in advance to avoid a fee.  Refer to the educational materials which you have been given on: General Risks, I had my Procedure. Discharge Instructions, Post Sedation.  Post Procedure Instructions:  The drugs you were given will stay in your system until tomorrow, so for the next 24 hours you should not drive, make any legal decisions or drink any alcoholic beverages.  You may eat anything you prefer, but it is better to start with liquids then soups and crackers, and gradually work up to solid foods.  Please notify your doctor immediately if you have any unusual bleeding, trouble breathing or pain that is not related to your normal pain.  Depending on the type of procedure that was done, some parts of your body may feel week and/or numb.  This usually clears up by tonight or the next day.  Walk with the use of an assistive device or accompanied by an adult for the 24 hours.  You may use ice on the affected area for the first 24 hours.  Put ice in a Ziploc bag and cover with a towel and place against area 15 minutes on 15  minutes off.  You may switch to heat after 24 hours.GENERAL RISKS AND COMPLICATIONS  What are the risk, side effects and possible complications? Generally speaking, most procedures are safe.  However, with any procedure there are risks, side effects, and the possibility of complications.  The risks and complications are dependent upon the sites that are lesioned, or the type of nerve block to be performed.  The closer the procedure is to the spine, the more serious the risks are.  Great care is taken when placing the radio frequency needles, block needles or lesioning probes, but sometimes complications can occur. 1. Infection: Any time there is an injection through the skin, there is a risk of infection.  This is why sterile conditions are used for these blocks.  There are four possible types of infection. 1. Localized skin infection. 2. Central Nervous System Infection-This can be in the form of Meningitis, which can be deadly. 3. Epidural Infections-This can be in the form of an epidural abscess, which can cause pressure inside of the spine, causing compression of the spinal cord with subsequent paralysis. This would require an emergency surgery to decompress, and there are no guarantees that the patient would recover from the paralysis. 4. Discitis-This is an infection of the intervertebral discs.  It occurs in about 1% of discography procedures.  It is difficult to treat and it may lead to surgery.        2. Pain: the needles have to go through skin and soft tissues, will cause soreness.  3. Damage to internal structures:  The nerves to be lesioned may be near blood vessels or    other nerves which can be potentially damaged.       4. Bleeding: Bleeding is more common if the patient is taking blood thinners such as  aspirin, Coumadin, Ticiid, Plavix, etc., or if he/she have some genetic predisposition  such as hemophilia. Bleeding into the spinal canal can cause compression of the spinal  cord  with subsequent paralysis.  This would require an emergency surgery to  decompress and there are no guarantees that the patient would recover from the  paralysis.       5. Pneumothorax:  Puncturing of a lung is a possibility, every time a needle is introduced in  the area of the chest or upper back.  Pneumothorax refers to free air around the  collapsed lung(s), inside of the thoracic cavity (chest cavity).  Another two possible  complications related to a similar event would include: Hemothorax and Chylothorax.   These are variations of the Pneumothorax, where instead of air around the collapsed  lung(s), you may have blood or chyle, respectively.       6. Spinal headaches: They may occur with any procedures in the area of the spine.       7. Persistent CSF (Cerebro-Spinal Fluid) leakage: This is a rare problem, but may occur  with prolonged intrathecal or epidural catheters either due to the formation of a fistulous  track or a dural tear.       8. Nerve damage: By working so close to the spinal cord, there is always a possibility of  nerve damage, which could be as serious as a permanent spinal cord injury with  paralysis.       9. Death:  Although rare, severe deadly allergic reactions known as "Anaphylactic  reaction" can occur to any of the medications used.      10. Worsening of the symptoms:  We can always make thing worse.  What are the chances of something like this happening? Chances of any of this occuring are extremely low.  By statistics, you have more of a chance of getting killed in a motor vehicle accident: while driving to the hospital than any of the above occurring .  Nevertheless, you should be aware that they are possibilities.  In general, it is similar to taking a shower.  Everybody knows that you can slip, hit your head and get killed.  Does that mean that you should not shower again?  Nevertheless always keep in mind that statistics do not mean anything if you happen to be on the  wrong side of them.  Even if a procedure has a 1 (one) in a 1,000,000 (million) chance of going wrong, it you happen to be that one..Also, keep in mind that by statistics, you have more of a chance of having something go wrong when taking medications.  Who should not have this procedure? If you are on a blood thinning medication (e.g. Coumadin, Plavix, see list of "Blood Thinners"), or if you have an active infection going on, you should not have the procedure.  If you are taking any blood thinners, please inform your physician.  How should I prepare for this procedure?  Do not eat or drink anything at least six hours prior to the procedure.  Bring a driver with you .  It cannot be a taxi.  Come accompanied by an adult that can drive you back, and  that is strong enough to help you if your legs get weak or numb from the local anesthetic.  Take all of your medicines the morning of the procedure with just enough water to swallow them.  If you have diabetes, make sure that you are scheduled to have your procedure done first thing in the morning, whenever possible.  If you have diabetes, take only half of your insulin dose and notify our nurse that you have done so as soon as you arrive at the clinic.  If you are diabetic, but only take blood sugar pills (oral hypoglycemic), then do not take them on the morning of your procedure.  You may take them after you have had the procedure.  Do not take aspirin or any aspirin-containing medications, at least eleven (11) days prior to the procedure.  They may prolong bleeding.  Wear loose fitting clothing that may be easy to take off and that you would not mind if it got stained with Betadine or blood.  Do not wear any jewelry or perfume  Remove any nail coloring.  It will interfere with some of our monitoring equipment.  NOTE: Remember that this is not meant to be interpreted as a complete list of all possible complications.  Unforeseen problems may  occur.  BLOOD THINNERS The following drugs contain aspirin or other products, which can cause increased bleeding during surgery and should not be taken for 2 weeks prior to and 1 week after surgery.  If you should need take something for relief of minor pain, you may take acetaminophen which is found in Tylenol,m Datril, Anacin-3 and Panadol. It is not blood thinner. The products listed below are.  Do not take any of the products listed below in addition to any listed on your instruction sheet.  A.P.C or A.P.C with Codeine Codeine Phosphate Capsules #3 Ibuprofen Ridaura  ABC compound Congesprin Imuran rimadil  Advil Cope Indocin Robaxisal  Alka-Seltzer Effervescent Pain Reliever and Antacid Coricidin or Coricidin-D  Indomethacin Rufen  Alka-Seltzer plus Cold Medicine Cosprin Ketoprofen S-A-C Tablets  Anacin Analgesic Tablets or Capsules Coumadin Korlgesic Salflex  Anacin Extra Strength Analgesic tablets or capsules CP-2 Tablets Lanoril Salicylate  Anaprox Cuprimine Capsules Levenox Salocol  Anexsia-D Dalteparin Magan Salsalate  Anodynos Darvon compound Magnesium Salicylate Sine-off  Ansaid Dasin Capsules Magsal Sodium Salicylate  Anturane Depen Capsules Marnal Soma  APF Arthritis pain formula Dewitt's Pills Measurin Stanback  Argesic Dia-Gesic Meclofenamic Sulfinpyrazone  Arthritis Bayer Timed Release Aspirin Diclofenac Meclomen Sulindac  Arthritis pain formula Anacin Dicumarol Medipren Supac  Analgesic (Safety coated) Arthralgen Diffunasal Mefanamic Suprofen  Arthritis Strength Bufferin Dihydrocodeine Mepro Compound Suprol  Arthropan liquid Dopirydamole Methcarbomol with Aspirin Synalgos  ASA tablets/Enseals Disalcid Micrainin Tagament  Ascriptin Doan's Midol Talwin  Ascriptin A/D Dolene Mobidin Tanderil  Ascriptin Extra Strength Dolobid Moblgesic Ticlid  Ascriptin with Codeine Doloprin or Doloprin with Codeine Momentum Tolectin  Asperbuf Duoprin Mono-gesic Trendar  Aspergum Duradyne  Motrin or Motrin IB Triminicin  Aspirin plain, buffered or enteric coated Durasal Myochrisine Trigesic  Aspirin Suppositories Easprin Nalfon Trillsate  Aspirin with Codeine Ecotrin Regular or Extra Strength Naprosyn Uracel  Atromid-S Efficin Naproxen Ursinus  Auranofin Capsules Elmiron Neocylate Vanquish  Axotal Emagrin Norgesic Verin  Azathioprine Empirin or Empirin with Codeine Normiflo Vitamin E  Azolid Emprazil Nuprin Voltaren  Bayer Aspirin plain, buffered or children's or timed BC Tablets or powders Encaprin Orgaran Warfarin Sodium  Buff-a-Comp Enoxaparin Orudis Zorpin  Buff-a-Comp with Codeine Equegesic Os-Cal-Gesic   Buffaprin Excedrin plain, buffered  or Extra Strength Oxalid   Bufferin Arthritis Strength Feldene Oxphenbutazone   Bufferin plain or Extra Strength Feldene Capsules Oxycodone with Aspirin   Bufferin with Codeine Fenoprofen Fenoprofen Pabalate or Pabalate-SF   Buffets II Flogesic Panagesic   Buffinol plain or Extra Strength Florinal or Florinal with Codeine Panwarfarin   Buf-Tabs Flurbiprofen Penicillamine   Butalbital Compound Four-way cold tablets Penicillin   Butazolidin Fragmin Pepto-Bismol   Carbenicillin Geminisyn Percodan   Carna Arthritis Reliever Geopen Persantine   Carprofen Gold's salt Persistin   Chloramphenicol Goody's Phenylbutazone   Chloromycetin Haltrain Piroxlcam   Clmetidine heparin Plaquenil   Cllnoril Hyco-pap Ponstel   Clofibrate Hydroxy chloroquine Propoxyphen         Before stopping any of these medications, be sure to consult the physician who ordered them.  Some, such as Coumadin (Warfarin) are ordered to prevent or treat serious conditions such as "deep thrombosis", "pumonary embolisms", and other heart problems.  The amount of time that you may need off of the medication may also vary with the medication and the reason for which you were taking it.  If you are taking any of these medications, please make sure you notify your pain  physician before you undergo any procedures.

## 2015-08-13 ENCOUNTER — Telehealth: Payer: Self-pay | Admitting: *Deleted

## 2015-08-13 ENCOUNTER — Telehealth: Payer: Self-pay

## 2015-08-13 NOTE — Telephone Encounter (Signed)
Spoke with Eschol, states his numbness in his leg is better and he is reassurred.

## 2015-08-13 NOTE — Telephone Encounter (Signed)
Nurses This is an expected and normal response to selective nerve root block The patient's numbness and weakness should lessen today and the following days If the patient does not continue to have less numbness and weakness of the lower extremity have patient call us and go to the emergency department immediately

## 2015-08-13 NOTE — Telephone Encounter (Signed)
States he is still having some numbness and someone had already called him

## 2015-08-13 NOTE — Telephone Encounter (Signed)
Patient verbalizes numbness in leg, denies any fever.  Encouraged to use support when walking and take care with ambulation so he doesn't fall.  Encouraged that he may call us back if numbness does not improve.  Will call patient back this afternoon to see if numbness has improved and will also make Dr Primus Bravo aware.

## 2015-08-20 ENCOUNTER — Encounter: Payer: Medicare Other | Admitting: Family Medicine

## 2015-08-21 ENCOUNTER — Encounter: Payer: Self-pay | Admitting: Family Medicine

## 2015-08-21 ENCOUNTER — Ambulatory Visit (INDEPENDENT_AMBULATORY_CARE_PROVIDER_SITE_OTHER): Payer: Medicare Other | Admitting: Family Medicine

## 2015-08-21 VITALS — BP 130/74 | HR 56 | Temp 98.6°F | Ht 66.7 in | Wt 205.0 lb

## 2015-08-21 DIAGNOSIS — G43009 Migraine without aura, not intractable, without status migrainosus: Secondary | ICD-10-CM | POA: Diagnosis not present

## 2015-08-21 DIAGNOSIS — Z23 Encounter for immunization: Secondary | ICD-10-CM | POA: Diagnosis not present

## 2015-08-21 DIAGNOSIS — G43909 Migraine, unspecified, not intractable, without status migrainosus: Secondary | ICD-10-CM | POA: Insufficient documentation

## 2015-08-21 DIAGNOSIS — R079 Chest pain, unspecified: Secondary | ICD-10-CM

## 2015-08-21 MED ORDER — SUMATRIPTAN SUCCINATE 100 MG PO TABS: 100.0000 mg | ORAL_TABLET | Freq: Once | ORAL | Status: DC

## 2015-08-21 NOTE — Addendum Note (Signed)
Addended byGolden Pop on: 08/21/2015 03:18 PM   Modules accepted: Orders, SmartSet

## 2015-08-21 NOTE — Progress Notes (Signed)
BP 130/74 mmHg  Pulse 56  Temp(Src) 98.6 F (37 C)  Ht 5' 6.7" (1.694 m)  Wt 205 lb (92.987 kg)  BMI 32.40 kg/m2  SpO2 98%   Subjective:    Patient ID: Edward Flesher., male    DOB: 24-Feb-1956, 59 y.o.   MRN: [id number]  HPI: Edward Carver. is a 59 y.o. male  Chief Complaint  Patient presents with  . Annual Exam   patient for physical exam which was deferred due to patient's complaints of angina Will schedule physical later this year   patient with primary concerns having recurrent migraine headaches which is been treated by on Imitrex in the past which was very effective agent once on Imitrex again  Patient was recently discharged from hospital for syncope attack has been eating better drinking better hasn't had any further spells reviewed hospital records and of note no cardiovascular workup was done  Patient with marked shortness of breath with exertion associated with chest pressure and tightness also gets diaphoretic Also some nausea associated with this chest pain chest pressure. Patient hasn't been to a heart doctor Symptoms of been ongoing about 3 months  Reviewed hospital discharge summary from [facility] last month for for dehydration Patient was felt to be dehydrated hasn't had any further spells there is no cardiac work done at that time on review   Relevant past medical, surgical, family and social history reviewed and updated as indicated. Interim medical history since our last visit reviewed. Allergies and medications reviewed and updated.  Review of Systems  Constitutional: Positive for activity change and fatigue. Negative for fever.  HENT: Negative for congestion, ear pain, sinus pressure and sore throat.   Eyes: Negative.   Respiratory: Positive for cough, chest tightness and shortness of breath. Negative for wheezing.   Cardiovascular: Positive for chest pain, palpitations and leg swelling.  Gastrointestinal: Negative for abdominal pain and abdominal  distention.  Endocrine: Negative.   Genitourinary: Negative.   Musculoskeletal:       Chronic back pain being managed by Dr. crisp  Skin: Negative.   Neurological: Positive for headaches.       Migraine symptoms  Hematological: Negative.   Psychiatric/Behavioral: Negative.     Per HPI unless specifically indicated above     Objective:    BP 130/74 mmHg  Pulse 56  Temp(Src) 98.6 F (37 C)  Ht 5' 6.7" (1.694 m)  Wt 205 lb (92.987 kg)  BMI 32.40 kg/m2  SpO2 98%  Wt Readings from Last 3 Encounters:  08/21/15 205 lb (92.987 kg)  08/12/15 183 lb (83.008 kg)  08/06/15 181 lb (82.101 kg)    Physical Exam  Constitutional: He is oriented to person, place, and time. He appears well-developed and well-nourished. No distress.  HENT:  Head: Normocephalic and atraumatic.  Right Ear: Hearing and external ear normal.  Left Ear: Hearing and external ear normal.  Nose: Nose normal.  Eyes: Conjunctivae and lids are normal. Pupils are equal, round, and reactive to light. Right eye exhibits no discharge. Left eye exhibits no discharge. No scleral icterus.  Neck: Normal range of motion. No tracheal deviation present. No thyromegaly present.  Cardiovascular: Normal rate, regular rhythm, normal heart sounds and intact distal pulses.   Pulmonary/Chest: Effort normal and breath sounds normal. No respiratory distress. He has no wheezes. He has no rales. He exhibits no tenderness.  Abdominal: He exhibits no mass. There is no rebound and no guarding.  Musculoskeletal: Normal range of motion.  He exhibits no edema or tenderness.  Lymphadenopathy:    He has no cervical adenopathy.  Neurological: He is alert and oriented to person, place, and time. He displays normal reflexes. No cranial nerve deficit. He exhibits normal muscle tone. Coordination normal.  Skin: Skin is warm, dry and intact. No rash noted.  Psychiatric: He has a normal mood and affect. His speech is normal and behavior is normal.  Judgment and thought content normal. Cognition and memory are normal.    Results for orders placed or performed in visit on 05/01/15  Platelet function assay  Result Value Ref Range   Collagen / ADP 57 0 - 118 seconds   PFA Interpretation           Collagen / Epinephrine 171 0 - 193 seconds  APTT  Result Value Ref Range   aPTT 27 24 - 36 seconds  Protime-INR  Result Value Ref Range   Prothrombin Time 12.6 11.4 - 15.0 seconds   INR 0.92       Assessment & Plan:   Problem List Items Addressed This Visit      Cardiovascular and Mediastinum   Migraine headache    Discuss migraine headaches will give patient on Imitrex      Relevant Orders   Comprehensive metabolic panel   Lipid panel   CBC with Differential/Platelet   TSH     Other   Chest pain    Due to patient's symptoms of angina will refer patient to cardiology to further evaluate Discuss calling 911 for intractable angina Taking aspirin Patient's stay off cigarettes Stay off alcohol Will continue current medications no change       Relevant Orders   Comprehensive metabolic panel   Lipid panel   CBC with Differential/Platelet   TSH   EKG 12-Lead (Completed)   Ambulatory referral to Cardiology    Other Visit Diagnoses    Immunization due    -  Primary    Relevant Orders    Flu Vaccine QUAD 36+ mos PF IM (Fluarix & Fluzone Quad PF) (Completed)        Follow up plan: Return in about 4 weeks (around 09/18/2015) for Physical Exam.

## 2015-08-21 NOTE — Assessment & Plan Note (Signed)
Discuss migraine headaches will give patient on Imitrex

## 2015-08-21 NOTE — Assessment & Plan Note (Addendum)
Due to patient's symptoms of angina will refer patient to cardiology to further evaluate Discuss calling 911 for intractable angina Taking aspirin Patient's stay off cigarettes Stay off alcohol Will continue current medications no change

## 2015-08-22 ENCOUNTER — Encounter: Payer: Self-pay | Admitting: Family Medicine

## 2015-08-22 DIAGNOSIS — Z23 Encounter for immunization: Secondary | ICD-10-CM | POA: Diagnosis not present

## 2015-08-22 LAB — COMPREHENSIVE METABOLIC PANEL
ALT: 31 IU/L (ref 0–44)
AST: 21 IU/L (ref 0–40)
Albumin/Globulin Ratio: 1.8 (ref 1.1–2.5)
Albumin: 4.4 g/dL (ref 3.5–5.5)
Alkaline Phosphatase: 73 IU/L (ref 39–117)
BUN/Creatinine Ratio: 11 (ref 9–20)
BUN: 9 mg/dL (ref 6–24)
Bilirubin Total: 0.7 mg/dL (ref 0.0–1.2)
CALCIUM: 9.6 mg/dL (ref 8.7–10.2)
CO2: 25 mmol/L (ref 18–29)
CREATININE: 0.84 mg/dL (ref 0.76–1.27)
Chloride: 98 mmol/L (ref 97–106)
GFR calc Af Amer: 112 mL/min/{1.73_m2} (ref >59)
GFR, EST NON AFRICAN AMERICAN: 97 mL/min/{1.73_m2} (ref >59)
GLOBULIN, TOTAL: 2.5 g/dL (ref 1.5–4.5)
Glucose: 81 mg/dL (ref 65–99)
Potassium: 4.7 mmol/L (ref 3.5–5.2)
SODIUM: 139 mmol/L (ref 136–144)
Total Protein: 6.9 g/dL (ref 6.0–8.5)

## 2015-08-22 LAB — CBC WITH DIFFERENTIAL/PLATELET
BASOS ABS: 0.1 10*3/uL (ref 0.0–0.2)
Basos: 1 %
EOS (ABSOLUTE): 0.3 10*3/uL (ref 0.0–0.4)
Eos: 3 %
Hematocrit: 39 % (ref 37.5–51.0)
Hemoglobin: 13.1 g/dL (ref 12.6–17.7)
Immature Grans (Abs): 0.1 10*3/uL (ref 0.0–0.1)
Immature Granulocytes: 1 %
LYMPHS ABS: 4.3 10*3/uL — AB (ref 0.7–3.1)
LYMPHS: 42 %
MCH: 30.3 pg (ref 26.6–33.0)
MCHC: 33.6 g/dL (ref 31.5–35.7)
MCV: 90 fL (ref 79–97)
MONOS ABS: 1 10*3/uL — AB (ref 0.1–0.9)
Monocytes: 10 %
NEUTROS ABS: 4.3 10*3/uL (ref 1.4–7.0)
Neutrophils: 43 %
PLATELETS: 191 10*3/uL (ref 150–379)
RBC: 4.32 x10E6/uL (ref 4.14–5.80)
RDW: 13.2 % (ref 12.3–15.4)
WBC: 10 10*3/uL (ref 3.4–10.8)

## 2015-08-22 LAB — LIPID PANEL
CHOL/HDL RATIO: 4.5 ratio (ref 0.0–5.0)
Cholesterol, Total: 250 mg/dL — ABNORMAL HIGH (ref 100–199)
HDL: 56 mg/dL (ref >39)
LDL CALC: 165 mg/dL — AB (ref 0–99)
TRIGLYCERIDES: 144 mg/dL (ref 0–149)
VLDL CHOLESTEROL CAL: 29 mg/dL (ref 5–40)

## 2015-08-22 LAB — TSH: TSH: 3.42 u[IU]/mL (ref 0.450–4.500)

## 2015-08-26 ENCOUNTER — Other Ambulatory Visit: Payer: Self-pay | Admitting: Family Medicine

## 2015-08-26 ENCOUNTER — Telehealth: Payer: Self-pay | Admitting: Family Medicine

## 2015-08-26 DIAGNOSIS — I700 Atherosclerosis of aorta: Secondary | ICD-10-CM | POA: Insufficient documentation

## 2015-08-26 DIAGNOSIS — I200 Unstable angina: Secondary | ICD-10-CM | POA: Diagnosis not present

## 2015-08-26 DIAGNOSIS — R55 Syncope and collapse: Secondary | ICD-10-CM | POA: Diagnosis not present

## 2015-08-26 DIAGNOSIS — R0602 Shortness of breath: Secondary | ICD-10-CM | POA: Diagnosis not present

## 2015-08-26 DIAGNOSIS — I209 Angina pectoris, unspecified: Secondary | ICD-10-CM | POA: Diagnosis not present

## 2015-08-26 MED ORDER — ROSUVASTATIN CALCIUM 20 MG PO TABS: 20.0000 mg | ORAL_TABLET | Freq: Every day | ORAL | Status: DC

## 2015-08-26 NOTE — Progress Notes (Signed)
Phone call Discussed with patient markedly elevated cholesterol Will start Crestor patient has physical next month

## 2015-08-26 NOTE — Telephone Encounter (Signed)
Please call pt ASAP regarding his results. Pt stated someone called him this morning while he was at the cardiologist. Thanks.

## 2015-08-27 ENCOUNTER — Telehealth: Payer: Self-pay | Admitting: Family Medicine

## 2015-08-27 MED ORDER — ROSUVASTATIN CALCIUM 20 MG PO TABS: 20.0000 mg | ORAL_TABLET | Freq: Every day | ORAL | Status: DC

## 2015-08-27 NOTE — Telephone Encounter (Signed)
Pt states [facility] told him he is allergic to an ingredient in Crestor so will need a different medication.  [place]

## 2015-08-27 NOTE — Telephone Encounter (Signed)
Call pt 

## 2015-08-30 DIAGNOSIS — R55 Syncope and collapse: Secondary | ICD-10-CM | POA: Diagnosis not present

## 2015-09-05 ENCOUNTER — Encounter: Payer: Self-pay | Admitting: Pain Medicine

## 2015-09-05 ENCOUNTER — Ambulatory Visit: Payer: Medicare Other | Attending: Pain Medicine | Admitting: Pain Medicine

## 2015-09-05 VITALS — BP 133/76 | HR 57 | Temp 97.8°F | Resp 18 | Ht 68.0 in | Wt 198.0 lb

## 2015-09-05 DIAGNOSIS — M47817 Spondylosis without myelopathy or radiculopathy, lumbosacral region: Secondary | ICD-10-CM | POA: Diagnosis not present

## 2015-09-05 DIAGNOSIS — M791 Myalgia: Secondary | ICD-10-CM | POA: Diagnosis not present

## 2015-09-05 DIAGNOSIS — M5416 Radiculopathy, lumbar region: Secondary | ICD-10-CM | POA: Diagnosis not present

## 2015-09-05 DIAGNOSIS — M533 Sacrococcygeal disorders, not elsewhere classified: Secondary | ICD-10-CM | POA: Diagnosis not present

## 2015-09-05 DIAGNOSIS — M961 Postlaminectomy syndrome, not elsewhere classified: Secondary | ICD-10-CM

## 2015-09-05 DIAGNOSIS — M5481 Occipital neuralgia: Secondary | ICD-10-CM

## 2015-09-05 DIAGNOSIS — Z981 Arthrodesis status: Secondary | ICD-10-CM

## 2015-09-05 DIAGNOSIS — Z7982 Long term (current) use of aspirin: Secondary | ICD-10-CM

## 2015-09-05 DIAGNOSIS — G609 Hereditary and idiopathic neuropathy, unspecified: Secondary | ICD-10-CM

## 2015-09-05 DIAGNOSIS — M47816 Spondylosis without myelopathy or radiculopathy, lumbar region: Secondary | ICD-10-CM

## 2015-09-05 DIAGNOSIS — M792 Neuralgia and neuritis, unspecified: Secondary | ICD-10-CM

## 2015-09-05 DIAGNOSIS — M5030 Other cervical disc degeneration, unspecified cervical region: Secondary | ICD-10-CM

## 2015-09-05 DIAGNOSIS — M51369 Other intervertebral disc degeneration, lumbar region without mention of lumbar back pain or lower extremity pain: Secondary | ICD-10-CM

## 2015-09-05 DIAGNOSIS — Z9889 Other specified postprocedural states: Secondary | ICD-10-CM

## 2015-09-05 DIAGNOSIS — M5136 Other intervertebral disc degeneration, lumbar region: Secondary | ICD-10-CM

## 2015-09-05 DIAGNOSIS — M47812 Spondylosis without myelopathy or radiculopathy, cervical region: Secondary | ICD-10-CM

## 2015-09-05 MED ORDER — OXYCODONE HCL 5 MG PO TABS: ORAL_TABLET | ORAL | Status: DC

## 2015-09-05 NOTE — Patient Instructions (Addendum)
PLAN   Continue present medication oxycodone  F/U PCP Dr. Jeananne Rama for evaliation of  BP and general medical condition Discuss Cymbalta with Dr. Jeananne Rama. Cymbalta may be very effective for treating the burning pain of the lower extremities  F/U surgical evaluation. Follow-up surgical evaluation [facility] as discussed  F/U neurological evaluation. May consider pending follow-up evaluations  May consider radiofrequency rhizolysis or intraspinal procedures pending response to present treatment and F/U evaluation   Patient to call Pain Management Center should patient have concerns prior to scheduled return appointment.

## 2015-09-05 NOTE — Progress Notes (Signed)
   Subjective:    Patient ID: Edward Schneider., male    DOB: 10-11-1956, 59 y.o.   MRN: [id number]  HPI the patient is a 59 year old gentleman who returns to pain management for further evaluation and treatment of pain involving the lower back and lower extremity region. The patient is a prior surgical intervention of the cervical region as well as the lumbar region. The patient states that his pain involves the lower back and lower extremity region with 90% of the pain involving the lower extremity region which is associated with burning sensations. We discussed patient's condition including neurosurgical reevaluation and will continue oxycodone as prescribed at this time. We will also consider Cymbalta and we'll have patient follow-up with Dr. Jeananne Rama to discuss patient use of Cymbalta. We will avoid interventional treatment at this time and we have also addressed patient undergoing neurosurgical reevaluation. The patient agreed to suggested treatment plan      Review of Systems     Objective:   Physical Exam  There was tenderness to palpation of the splenius capitate and occipitalis musculature regions. There was mild tenderness to palpation of the cervical facet cervical paraspinal musculature region. There was unremarkable Spurling's maneuver. Tinel and Phalen's maneuver were without increased pain of significant degree. Palpation over the acromioclavicular and glenohumeral joint regions reproduces minimal discomfort. Palpation over the thoracic facet thoracic paraspinal musculature region was associated with tenderness to palpation of moderate degree. Lateral bending and rotation extension and palpation over the lumbar facets reproduced moderate discomfort. Straight leg raising is limited to 20 without increased pain with dorsiflexion noted. There was negative clonus negative Homans. DTRs were difficult to elicit patient had difficulty relaxing. There was negative clonus negative Homans.  There was mild tenderness to palpation over the PSIS and PI is region as well as the greater trochanteric region and iliotibial band region. Abdomen was nontender with no costovertebral tenderness noted      Assessment & Plan:  Complex regional pain syndrome of the lower extremities  Lumbar radiculopathy  Degenerative disc disease lumbar spine Multilevel degenerative changes lumbar spine with disc protrusion, facet arthropathy, foraminal narrowing, status post lumbar surgery.  Lumbar facet syndrome  Neuralgia of the lower extremities    PLAN   Continue present medication oxycodone  F/U PCP Dr. Jeananne Rama for evaliation of  BP and general medical condition Discuss Cymbalta with Dr. Jeananne Rama. Cymbalta may be very effective for treating the burning pain of the lower extremities  F/U surgical evaluation. Follow-up surgical evaluation [facility] as discussed  F/U neurological evaluation. May consider pending follow-up evaluations  May consider radiofrequency rhizolysis or intraspinal procedures pending response to present treatment and F/U evaluation   Patient to call Pain Management Center should patient have concerns prior to scheduled return appointmen

## 2015-09-05 NOTE — Progress Notes (Signed)
Safety precautions to be maintained throughout the outpatient stay will include: orient to surroundings, keep bed in low position, maintain call bell within reach at all times, provide assistance with transfer out of bed and ambulation.  

## 2015-09-05 NOTE — Progress Notes (Signed)
Oxycodone script given to patient.

## 2015-09-10 ENCOUNTER — Ambulatory Visit: Payer: Medicare Other | Admitting: Pain Medicine

## 2015-09-10 DIAGNOSIS — R55 Syncope and collapse: Secondary | ICD-10-CM | POA: Diagnosis not present

## 2015-09-10 DIAGNOSIS — I209 Angina pectoris, unspecified: Secondary | ICD-10-CM | POA: Diagnosis not present

## 2015-09-11 DIAGNOSIS — I209 Angina pectoris, unspecified: Secondary | ICD-10-CM | POA: Diagnosis not present

## 2015-09-11 DIAGNOSIS — I6523 Occlusion and stenosis of bilateral carotid arteries: Secondary | ICD-10-CM | POA: Insufficient documentation

## 2015-09-11 DIAGNOSIS — R55 Syncope and collapse: Secondary | ICD-10-CM | POA: Diagnosis not present

## 2015-09-11 DIAGNOSIS — I6529 Occlusion and stenosis of unspecified carotid artery: Secondary | ICD-10-CM | POA: Insufficient documentation

## 2015-09-11 DIAGNOSIS — E782 Mixed hyperlipidemia: Secondary | ICD-10-CM | POA: Diagnosis not present

## 2015-09-23 ENCOUNTER — Encounter: Payer: Self-pay | Admitting: Family Medicine

## 2015-09-23 ENCOUNTER — Ambulatory Visit (INDEPENDENT_AMBULATORY_CARE_PROVIDER_SITE_OTHER): Payer: Medicare Other | Admitting: Family Medicine

## 2015-09-23 VITALS — BP 132/81 | HR 67 | Temp 98.9°F | Ht 68.0 in | Wt 214.0 lb

## 2015-09-23 DIAGNOSIS — E1169 Type 2 diabetes mellitus with other specified complication: Secondary | ICD-10-CM | POA: Insufficient documentation

## 2015-09-23 DIAGNOSIS — G43009 Migraine without aura, not intractable, without status migrainosus: Secondary | ICD-10-CM

## 2015-09-23 DIAGNOSIS — M549 Dorsalgia, unspecified: Secondary | ICD-10-CM

## 2015-09-23 DIAGNOSIS — G8929 Other chronic pain: Secondary | ICD-10-CM

## 2015-09-23 DIAGNOSIS — Z0000 Encounter for general adult medical examination without abnormal findings: Secondary | ICD-10-CM | POA: Diagnosis not present

## 2015-09-23 DIAGNOSIS — E7800 Pure hypercholesterolemia, unspecified: Secondary | ICD-10-CM

## 2015-09-23 MED ORDER — SUMATRIPTAN SUCCINATE 100 MG PO TABS: 100.0000 mg | ORAL_TABLET | Freq: Once | ORAL | Status: DC

## 2015-09-23 MED ORDER — ROSUVASTATIN CALCIUM 20 MG PO TABS: 20.0000 mg | ORAL_TABLET | Freq: Every day | ORAL | Status: DC

## 2015-09-23 NOTE — Progress Notes (Signed)
BP 132/81 mmHg  Pulse 67  Temp(Src) 98.9 F (37.2 C)  Ht 5\' 8"  (1.727 m)  Wt 214 lb (97.07 kg)  BMI 32.55 kg/m2  SpO2 97%   Subjective:    Patient ID: Edward Schneider., male    DOB: Mar 29, 1956, 59 y.o.   MRN: [id number]  HPI: Edward Schneider. is a 59 y.o. male  Chief Complaint  Patient presents with  . Annual Exam   Reviewed cardiac workup which was largely negative patient doing well with no further complaints taking cholesterol medicines without issues Headaches doing well with medications Doing well at pain clinic pain is adequately controlled. Still off cigarettes for 5 months now. Relevant past medical, surgical, family and social history reviewed and updated as indicated. Interim medical history since our last visit reviewed. Allergies and medications reviewed and updated.  Review of Systems  Constitutional: Negative.   HENT: Negative.   Eyes: Negative.   Respiratory: Negative.   Cardiovascular: Negative.   Gastrointestinal: Negative.   Endocrine: Negative.   Genitourinary: Negative.   Musculoskeletal: Negative.   Skin: Negative.   Allergic/Immunologic: Negative.   Neurological: Negative.   Hematological: Negative.   Psychiatric/Behavioral: Negative.     Per HPI unless specifically indicated above     Objective:    BP 132/81 mmHg  Pulse 67  Temp(Src) 98.9 F (37.2 C)  Ht 5\' 8"  (1.727 m)  Wt 214 lb (97.07 kg)  BMI 32.55 kg/m2  SpO2 97%  Wt Readings from Last 3 Encounters:  09/23/15 214 lb (97.07 kg)  09/05/15 198 lb (89.812 kg)  08/21/15 205 lb (92.987 kg)    Physical Exam  Constitutional: He is oriented to person, place, and time. He appears well-developed and well-nourished.  HENT:  Head: Normocephalic and atraumatic.  Right Ear: External ear normal.  Left Ear: External ear normal.  Eyes: Conjunctivae and EOM are normal. Pupils are equal, round, and reactive to light.  Neck: Normal range of motion. Neck supple.  Cardiovascular: Normal  rate, regular rhythm, normal heart sounds and intact distal pulses.   Pulmonary/Chest: Effort normal and breath sounds normal.  Abdominal: Soft. Bowel sounds are normal. There is no splenomegaly or hepatomegaly.  Genitourinary: Rectum normal, prostate normal and penis normal.  Musculoskeletal: Normal range of motion.  Neurological: He is alert and oriented to person, place, and time. He has normal reflexes.  Skin: No rash noted. No erythema.  Psychiatric: He has a normal mood and affect. His behavior is normal. Judgment and thought content normal.    Results for orders placed or performed in visit on 08/21/15  Comprehensive metabolic panel  Result Value Ref Range   Glucose 81 65 - 99 mg/dL   BUN 9 6 - 24 mg/dL   Creatinine, Ser 0.84 0.76 - 1.27 mg/dL   GFR calc non Af Amer 97 >59 mL/min/1.73   GFR calc Af Amer 112 >59 mL/min/1.73   BUN/Creatinine Ratio 11 9 - 20   Sodium 139 136 - 144 mmol/L   Potassium 4.7 3.5 - 5.2 mmol/L   Chloride 98 97 - 106 mmol/L   CO2 25 18 - 29 mmol/L   Calcium 9.6 8.7 - 10.2 mg/dL   Total Protein 6.9 6.0 - 8.5 g/dL   Albumin 4.4 3.5 - 5.5 g/dL   Globulin, Total 2.5 1.5 - 4.5 g/dL   Albumin/Globulin Ratio 1.8 1.1 - 2.5   Bilirubin Total 0.7 0.0 - 1.2 mg/dL   Alkaline Phosphatase 73 39 - 117 IU/L  AST 21 0 - 40 IU/L   ALT 31 0 - 44 IU/L  Lipid panel  Result Value Ref Range   Cholesterol, Total 250 (H) 100 - 199 mg/dL   Triglycerides 144 0 - 149 mg/dL   HDL 56 >39 mg/dL   VLDL Cholesterol Cal 29 5 - 40 mg/dL   LDL Calculated 165 (H) 0 - 99 mg/dL   Chol/HDL Ratio 4.5 0.0 - 5.0 ratio units  CBC with Differential/Platelet  Result Value Ref Range   WBC 10.0 3.4 - 10.8 x10E3/uL   RBC 4.32 4.14 - 5.80 x10E6/uL   Hemoglobin 13.1 12.6 - 17.7 g/dL   Hematocrit 39.0 37.5 - 51.0 %   MCV 90 79 - 97 fL   MCH 30.3 26.6 - 33.0 pg   MCHC 33.6 31.5 - 35.7 g/dL   RDW 13.2 12.3 - 15.4 %   Platelets 191 150 - 379 x10E3/uL   Neutrophils 43 %   Lymphs 42 %    Monocytes 10 %   Eos 3 %   Basos 1 %   Neutrophils Absolute 4.3 1.4 - 7.0 x10E3/uL   Lymphocytes Absolute 4.3 (H) 0.7 - 3.1 x10E3/uL   Monocytes Absolute 1.0 (H) 0.1 - 0.9 x10E3/uL   EOS (ABSOLUTE) 0.3 0.0 - 0.4 x10E3/uL   Basophils Absolute 0.1 0.0 - 0.2 x10E3/uL   Immature Granulocytes 1 %   Immature Grans (Abs) 0.1 0.0 - 0.1 x10E3/uL  TSH  Result Value Ref Range   TSH 3.420 0.450 - 4.500 uIU/mL      Assessment & Plan:   Problem List Items Addressed This Visit      Cardiovascular and Mediastinum   Migraine headache - Primary    Doing well with occasional Imitrex      Relevant Medications   SUMAtriptan (IMITREX) 100 MG tablet   rosuvastatin (CRESTOR) 20 MG tablet     Other   Hypercholesterolemia    Will continue Crestor check labs today to assess effectiveness and safety      Relevant Medications   rosuvastatin (CRESTOR) 20 MG tablet   Other Relevant Orders   Lipid panel   ALT   AST   Back pain, chronic    Followed at the pain clinic for multiple pain syndrome leg pain etc. is getting care       Other Visit Diagnoses    PE (physical exam), annual            Follow up plan: Return in about 6 months (around 03/23/2016) for Recheck labs with BMP, lipids, ALT, AST.

## 2015-09-23 NOTE — Assessment & Plan Note (Signed)
Doing well with occasional Imitrex

## 2015-09-23 NOTE — Assessment & Plan Note (Signed)
Will continue Crestor check labs today to assess effectiveness and safety

## 2015-09-23 NOTE — Assessment & Plan Note (Signed)
Followed at the pain clinic for multiple pain syndrome leg pain etc. is getting care

## 2015-09-24 LAB — AST: AST: 29 IU/L (ref 0–40)

## 2015-09-24 LAB — LIPID PANEL
Chol/HDL Ratio: 3.1 ratio units (ref 0.0–5.0)
Cholesterol, Total: 159 mg/dL (ref 100–199)
HDL: 52 mg/dL (ref >39)
LDL Calculated: 75 mg/dL (ref 0–99)
Triglycerides: 160 mg/dL — ABNORMAL HIGH (ref 0–149)
VLDL Cholesterol Cal: 32 mg/dL (ref 5–40)

## 2015-09-24 LAB — ALT: ALT: 36 IU/L (ref 0–44)

## 2015-09-27 ENCOUNTER — Telehealth: Payer: Self-pay | Admitting: Family Medicine

## 2015-09-27 NOTE — Telephone Encounter (Signed)
Pt says cholesterol medication is making his legs hurt and its hard for him to walk and he would like to know what to do.

## 2015-09-30 NOTE — Telephone Encounter (Signed)
Phone call Discussed with patient hold cholesterol medicine for 2 weeks observe leg pain restart cholesterol medication if makes his legs hurt patient will contact me

## 2015-10-01 ENCOUNTER — Telehealth: Payer: Self-pay | Admitting: Pain Medicine
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
## 2015-10-02 ENCOUNTER — Encounter: Payer: Medicare Other | Admitting: Pain Medicine

## 2015-10-03 ENCOUNTER — Ambulatory Visit: Payer: Medicare Other | Attending: Pain Medicine | Admitting: Pain Medicine

## 2015-10-03 ENCOUNTER — Encounter: Payer: Self-pay | Admitting: Pain Medicine

## 2015-10-03 VITALS — BP 134/72 | HR 65 | Temp 97.8°F | Resp 16 | Ht 68.0 in | Wt 208.0 lb

## 2015-10-03 DIAGNOSIS — G90522 Complex regional pain syndrome I of left lower limb: Secondary | ICD-10-CM | POA: Insufficient documentation

## 2015-10-03 DIAGNOSIS — M5481 Occipital neuralgia: Secondary | ICD-10-CM

## 2015-10-03 DIAGNOSIS — M792 Neuralgia and neuritis, unspecified: Secondary | ICD-10-CM

## 2015-10-03 DIAGNOSIS — M5116 Intervertebral disc disorders with radiculopathy, lumbar region: Secondary | ICD-10-CM | POA: Insufficient documentation

## 2015-10-03 DIAGNOSIS — M5136 Other intervertebral disc degeneration, lumbar region: Secondary | ICD-10-CM

## 2015-10-03 DIAGNOSIS — M47816 Spondylosis without myelopathy or radiculopathy, lumbar region: Secondary | ICD-10-CM

## 2015-10-03 DIAGNOSIS — M5126 Other intervertebral disc displacement, lumbar region: Secondary | ICD-10-CM | POA: Diagnosis not present

## 2015-10-03 DIAGNOSIS — Z5181 Encounter for therapeutic drug level monitoring: Secondary | ICD-10-CM | POA: Diagnosis not present

## 2015-10-03 DIAGNOSIS — M545 Low back pain: Secondary | ICD-10-CM | POA: Diagnosis present

## 2015-10-03 DIAGNOSIS — G609 Hereditary and idiopathic neuropathy, unspecified: Secondary | ICD-10-CM

## 2015-10-03 DIAGNOSIS — M961 Postlaminectomy syndrome, not elsewhere classified: Secondary | ICD-10-CM

## 2015-10-03 DIAGNOSIS — Z7982 Long term (current) use of aspirin: Secondary | ICD-10-CM

## 2015-10-03 DIAGNOSIS — M47812 Spondylosis without myelopathy or radiculopathy, cervical region: Secondary | ICD-10-CM

## 2015-10-03 DIAGNOSIS — Z79899 Other long term (current) drug therapy: Secondary | ICD-10-CM | POA: Diagnosis not present

## 2015-10-03 DIAGNOSIS — M5416 Radiculopathy, lumbar region: Secondary | ICD-10-CM

## 2015-10-03 DIAGNOSIS — F1120 Opioid dependence, uncomplicated: Secondary | ICD-10-CM | POA: Diagnosis not present

## 2015-10-03 DIAGNOSIS — Z0389 Encounter for observation for other suspected diseases and conditions ruled out: Secondary | ICD-10-CM | POA: Diagnosis not present

## 2015-10-03 DIAGNOSIS — M79604 Pain in right leg: Secondary | ICD-10-CM | POA: Diagnosis present

## 2015-10-03 DIAGNOSIS — M5030 Other cervical disc degeneration, unspecified cervical region: Secondary | ICD-10-CM

## 2015-10-03 DIAGNOSIS — M79605 Pain in left leg: Secondary | ICD-10-CM | POA: Diagnosis present

## 2015-10-03 DIAGNOSIS — M791 Myalgia: Secondary | ICD-10-CM | POA: Diagnosis not present

## 2015-10-03 DIAGNOSIS — Z9889 Other specified postprocedural states: Secondary | ICD-10-CM

## 2015-10-03 DIAGNOSIS — M533 Sacrococcygeal disorders, not elsewhere classified: Secondary | ICD-10-CM | POA: Diagnosis not present

## 2015-10-03 DIAGNOSIS — M47817 Spondylosis without myelopathy or radiculopathy, lumbosacral region: Secondary | ICD-10-CM | POA: Diagnosis not present

## 2015-10-03 DIAGNOSIS — Z981 Arthrodesis status: Secondary | ICD-10-CM

## 2015-10-03 MED ORDER — OXYCODONE HCL 5 MG PO TABS: ORAL_TABLET | ORAL | Status: DC

## 2015-10-03 NOTE — Progress Notes (Signed)
Safety precautions to be maintained throughout the outpatient stay will include: orient to surroundings, keep bed in low position, maintain call bell within reach at all times, provide assistance with transfer out of bed and ambulation.  

## 2015-10-03 NOTE — Progress Notes (Signed)
   Subjective:    Patient ID: Edward Schneider., male    DOB: 1956-08-22, 59 y.o.   MRN: [id number]  HPI  The patient is a 59 year old gentleman who returns to pain management Center for further evaluation and treatment of pain involving the lower back lower extremity region as well as the neck and upper extremity regions. The patient states that he has burning stinging sensation of the extremities. The patient's pain is aggravated by standing walking reaching and lifting pushing pulling maneuvers. The patient has had some improvement of pain with treatment in pain management Center at the present time we will adjust medications and will consider additional modifications of treatment regimen pending follow-up evaluation. The patient denies trauma change in events of daily living the call significant change in symptomatology. The patient also will follow-up with Dr. Jeananne Rama to discuss initiating Cymbalta. We will prescribe Cymbalta for patient once patient has clearance from Dr. Jeananne Rama to take Cymbalta. We will continue oxycodone as prescribed at this time and consider additional modifications of treatment regimen pending follow-up evaluation the patient was in agreement with suggested treatment plan  Review of Systems     Objective:   Physical Exam  There was tenderness of the splenius capitis and occipitalis musculature regions of mild to moderate degree. The patient was with an unremarkable Spurling's maneuver and was with bilaterally equal grip strength. Tinel and Phalen's maneuver were without increased pain of significant degree. Palpation over the thoracic facet thoracic paraspinal musculature region reproduced moderate discomfort. Patient was able to perform drop test without significant difficulty. Palpation over the thoracic facet thoracic paraspinal musculature region was attends to palpation with crepitus of the thoracic region noted. Palpation over the lumbar paraspinal musculature region  lumbar facet region was with moderate discomfort. The patient was able to perform lateral bending rotation extension and palpation of the lumbar facets with mild to moderate discomfort. There was mild tenderness over the region of the PSIS and PII S region as well as the gluteal and piriformis musculature region was tolerates approximately 30 without an increase of pain with dorsiflexion noted. There was negative clonus negative Homans. No definite sensory deficit or dermatomal distribution was detected. DTRs appeared to be trace at the knees. EHL strength appeared to be slightly decreased. Abdomen nontender with no costovertebral tenderness noted            Assessment & Plan:    Complex regional pain syndrome of the lower extremities  Lumbar radiculopathy  Degenerative disc disease lumbar spine Multilevel degenerative changes lumbar spine with disc protrusion, facet arthropathy, foraminal narrowing, status post lumbar surgery.    PLAN   Continue present medication oxycodone  F/U PCP Dr. Jeananne Rama for evaliation of  BP and general medical condition Discuss Cymbalta with Dr. Jeananne Rama. Cymbalta may be very effective for treating the burning pain of the lower extremities please see Dr. Jeananne Rama regarding Cymbalta as we discussed again today  F/U surgical evaluation. Follow-up surgical evaluation [facility] as discussed  F/U neurological evaluation. May consider pending follow-up evaluations  May consider radiofrequency rhizolysis or intraspinal procedures pending response to present treatment and F/U evaluation   Patient to call Pain Management Center should patient have concerns prior to scheduled return appointment.

## 2015-10-03 NOTE — Patient Instructions (Addendum)
PLAN   Continue present medication oxycodone  F/U PCP Dr. Jeananne Rama for evaliation of  BP and general medical condition Discuss Cymbalta with Dr. Jeananne Rama. Cymbalta may be very effective for treating the burning pain of the lower extremities please see Dr. Jeananne Rama regarding Cymbalta as we discussed again today  F/U surgical evaluation. Follow-up surgical evaluation [facility] as discussed  F/U neurological evaluation. May consider pending follow-up evaluations  May consider radiofrequency rhizolysis or intraspinal procedures pending response to present treatment and F/U evaluation   Patient to call Pain Management Center should patient have concerns prior to scheduled return appointment.

## 2015-10-04 ENCOUNTER — Emergency Department: Payer: Medicare Other

## 2015-10-04 ENCOUNTER — Emergency Department
Admission: EM | Admit: 2015-10-04 | Discharge: 2015-10-05 | Disposition: A | Discharge status: Home / Self Care | Payer: Medicare Other | Attending: Emergency Medicine | Admitting: Emergency Medicine

## 2015-10-04 ENCOUNTER — Other Ambulatory Visit: Payer: Self-pay

## 2015-10-04 DIAGNOSIS — R0602 Shortness of breath: Secondary | ICD-10-CM | POA: Diagnosis not present

## 2015-10-04 DIAGNOSIS — R200 Anesthesia of skin: Secondary | ICD-10-CM | POA: Insufficient documentation

## 2015-10-04 DIAGNOSIS — M79661 Pain in right lower leg: Secondary | ICD-10-CM | POA: Diagnosis not present

## 2015-10-04 DIAGNOSIS — Z79899 Other long term (current) drug therapy: Secondary | ICD-10-CM | POA: Diagnosis not present

## 2015-10-04 DIAGNOSIS — I10 Essential (primary) hypertension: Secondary | ICD-10-CM | POA: Insufficient documentation

## 2015-10-04 DIAGNOSIS — Z87891 Personal history of nicotine dependence: Secondary | ICD-10-CM | POA: Diagnosis not present

## 2015-10-04 DIAGNOSIS — M79604 Pain in right leg: Secondary | ICD-10-CM | POA: Insufficient documentation

## 2015-10-04 LAB — BASIC METABOLIC PANEL
Anion gap: 6 (ref 5–15)
BUN: 11 mg/dL (ref 6–20)
CHLORIDE: 107 mmol/L (ref 101–111)
CO2: 28 mmol/L (ref 22–32)
CREATININE: 0.91 mg/dL (ref 0.61–1.24)
Calcium: 9 mg/dL (ref 8.9–10.3)
GFR calc Af Amer: >60 mL/min (ref >60)
GFR calc non Af Amer: >60 mL/min (ref >60)
Glucose, Bld: 114 mg/dL — ABNORMAL HIGH (ref 65–99)
Potassium: 4 mmol/L (ref 3.5–5.1)
Sodium: 141 mmol/L (ref 135–145)

## 2015-10-04 LAB — CBC
HCT: 36.1 % — ABNORMAL LOW (ref 40.0–52.0)
Hemoglobin: 12 g/dL — ABNORMAL LOW (ref 13.0–18.0)
MCH: 29.8 pg (ref 26.0–34.0)
MCHC: 33.2 g/dL (ref 32.0–36.0)
MCV: 89.8 fL (ref 80.0–100.0)
PLATELETS: 141 10*3/uL — AB (ref 150–440)
RBC: 4.01 MIL/uL — ABNORMAL LOW (ref 4.40–5.90)
RDW: 13.6 % (ref 11.5–14.5)
WBC: 7.2 10*3/uL (ref 3.8–10.6)

## 2015-10-04 LAB — CK: Total CK: 556 U/L — ABNORMAL HIGH (ref 49–397)

## 2015-10-04 LAB — TROPONIN I: Troponin I: <0.03 ng/mL (ref <0.031)

## 2015-10-04 MED ORDER — METHYLPREDNISOLONE SODIUM SUCC 125 MG IJ SOLR
125.0000 mg | Freq: Once | INTRAMUSCULAR | Status: AC
  Administered 2015-10-05: 125 mg via INTRAVENOUS
  Filled 2015-10-04: qty 2

## 2015-10-04 MED ORDER — KETOROLAC TROMETHAMINE 30 MG/ML IJ SOLN
30.0000 mg | Freq: Once | INTRAMUSCULAR | Status: AC
Start: 2015-10-04 — End: 2015-10-04
  Administered 2015-10-04: 30 mg via INTRAVENOUS
  Filled 2015-10-04: qty 1

## 2015-10-04 MED ORDER — MORPHINE SULFATE (PF) 4 MG/ML IV SOLN
4.0000 mg | Freq: Once | INTRAVENOUS | Status: AC
  Administered 2015-10-05: 4 mg via INTRAVENOUS
  Filled 2015-10-04: qty 1

## 2015-10-04 MED ORDER — PREDNISONE 20 MG PO TABS: 40.0000 mg | ORAL_TABLET | Freq: Every day | ORAL | Status: DC

## 2015-10-04 NOTE — Discharge Instructions (Signed)
Please seek medical attention for any high fevers, chest pain, shortness of breath, change in behavior, persistent vomiting, bloody stool or any other new or concerning symptoms. ° ° °Pain Without a Known Cause °WHAT IS PAIN WITHOUT A KNOWN CAUSE? °Pain can occur in any part of the body and can range from mild to severe. Sometimes no cause can be found for why you are having pain. Some types of pain that can occur without a known cause include:  °· Headache. °· Back pain. °· Abdominal pain. °· Neck pain. °HOW IS PAIN WITHOUT A KNOWN CAUSE DIAGNOSED?  °Your health care provider will try to find the cause of your pain. This may include: °· Physical exam. °· Medical history. °· Blood tests. °· Urine tests. °· X-rays. °If no cause is found, your health care provider may diagnose you with pain without a known cause.  °IS THERE TREATMENT FOR PAIN WITHOUT A CAUSE?  °Treatment depends on the kind of pain you have. Your health care provider may prescribe medicines to help relieve your pain.  °WHAT CAN I DO AT HOME FOR MY PAIN?  °· Take medicines only as directed by your health care provider. °· Stop any activities that cause pain. During periods of severe pain, bed rest may help. °· Try to reduce your stress with activities such as yoga or meditation. Talk to your health care provider for other stress-reducing activity recommendations. °· Exercise regularly, if approved by your health care provider. °· Eat a healthy diet that includes fruits and vegetables. This may improve pain. Talk to your health care provider if you have any questions about your diet. °WHAT IF MY PAIN DOES NOT GET BETTER?  °If you have a painful condition and no reason can be found for the pain or the pain gets worse, it is important to follow up with your health care provider. It may be necessary to repeat tests and look further for a possible cause.  °  °This information is not intended to replace advice given to you by your health care provider. Make  sure you discuss any questions you have with your health care provider. °  °Document Released: 06/30/2001 Document Revised: 10/26/2014 Document Reviewed: 02/20/2014 °Elsevier Interactive Patient Education ©2016 [employer] ° °

## 2015-10-04 NOTE — ED Notes (Signed)
Patient transported to Ultrasound 
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
## 2015-10-10 ENCOUNTER — Encounter: Payer: Self-pay | Admitting: Family Medicine

## 2015-10-10 ENCOUNTER — Ambulatory Visit (INDEPENDENT_AMBULATORY_CARE_PROVIDER_SITE_OTHER): Payer: Medicare Other | Admitting: Family Medicine

## 2015-10-10 ENCOUNTER — Ambulatory Visit
Admission: RE | Admit: 2015-10-10 | Discharge: 2015-10-10 | Disposition: A | Discharge status: Home / Self Care | Payer: Medicare Other | Source: Ambulatory Visit | Attending: Family Medicine | Admitting: Family Medicine

## 2015-10-10 VITALS — BP 153/88 | HR 81 | Temp 97.7°F | Wt 205.0 lb

## 2015-10-10 DIAGNOSIS — M5136 Other intervertebral disc degeneration, lumbar region: Secondary | ICD-10-CM | POA: Diagnosis not present

## 2015-10-10 DIAGNOSIS — M545 Low back pain: Secondary | ICD-10-CM | POA: Diagnosis not present

## 2015-10-10 MED ORDER — PREDNISONE 10 MG PO TABS: ORAL_TABLET | ORAL | Status: DC

## 2015-10-10 NOTE — Progress Notes (Signed)
BP 153/88 mmHg  Pulse 81  Temp(Src) 97.7 F (36.5 C)  Wt 205 lb (92.987 kg)  SpO2 99%   Subjective:    Patient ID: Edward Schneider., male    DOB: [birth year], 59 y.o.   MRN: [id number]  HPI: Edward Thi. is a 59 y.o. male  Chief Complaint  Patient presents with  . ER Follow Up    Patient states that he is still having right leg pain, from the tip of his toes to the hip, he states that it is unbearable when it starts.   Patient had blood clot ruled out in his right leg swelling has resolved He'll have the same pain that led him to the emergency room but no swelling that led him to the emergency room No known trauma irritation Reviewed ER notes, and radiology negative Reviewing history again patient prior to going to the emergency room and problems started was decorating Christmas tree felt a pop in his right lower back area then subsequently his legs swell and marked pain in his leg which is persisted with no further swelling. Still just hurts height of been on multiple pain medications Relevant past medical, surgical, family and social history reviewed and updated as indicated. Interim medical history since our last visit reviewed. Allergies and medications reviewed and updated.  Review of Systems  Constitutional: Positive for activity change.       Feels so bad because of leg walking with a cane  HENT: Negative.   Respiratory: Negative.   Cardiovascular: Negative.     Per HPI unless specifically indicated above     Objective:    BP 153/88 mmHg  Pulse 81  Temp(Src) 97.7 F (36.5 C)  Wt 205 lb (92.987 kg)  SpO2 99%  Wt Readings from Last 3 Encounters:  10/10/15 205 lb (92.987 kg)  10/04/15 214 lb (97.07 kg)  10/03/15 208 lb (94.348 kg)    Physical Exam  Constitutional: He is oriented to person, place, and time. He appears well-developed and well-nourished. No distress.  HENT:  Head: Normocephalic and atraumatic.  Right Ear: Hearing normal.  Left Ear:  Hearing normal.  Nose: Nose normal.  Eyes: Conjunctivae and lids are normal. Right eye exhibits no discharge. Left eye exhibits no discharge. No scleral icterus.  Cardiovascular: Normal rate and regular rhythm.   Pulmonary/Chest: Effort normal and breath sounds normal. No respiratory distress.  Musculoskeletal:  Straight leg raising on the left normal right leg raising with marked pain and exacerbation with raising about 30 Using a cane and limited range of motion with right-sided weakness.  Neurological: He is alert and oriented to person, place, and time.  Skin: Skin is intact. No rash noted.  No rash in the back or leg  Psychiatric: He has a normal mood and affect. His speech is normal and behavior is normal. Judgment and thought content normal. Cognition and memory are normal.    Results for orders placed or performed during the hospital encounter of [id number]  Basic metabolic panel  Result Value Ref Range   Sodium 141 135 - 145 mmol/L   Potassium 4.0 3.5 - 5.1 mmol/L   Chloride 107 101 - 111 mmol/L   CO2 28 22 - 32 mmol/L   Glucose, Bld 114 (H) 65 - 99 mg/dL   BUN 11 6 - 20 mg/dL   Creatinine, Ser 0.91 0.61 - 1.24 mg/dL   Calcium 9.0 8.9 - 10.3 mg/dL   GFR calc non Af Amer >60 >  60 mL/min   GFR calc Af Amer >60 >60 mL/min   Anion gap 6 5 - 15  CBC  Result Value Ref Range   WBC 7.2 3.8 - 10.6 K/uL   RBC 4.01 (L) 4.40 - 5.90 MIL/uL   Hemoglobin 12.0 (L) 13.0 - 18.0 g/dL   HCT 36.1 (L) 40.0 - 52.0 %   MCV 89.8 80.0 - 100.0 fL   MCH 29.8 26.0 - 34.0 pg   MCHC 33.2 32.0 - 36.0 g/dL   RDW 13.6 11.5 - 14.5 %   Platelets 141 (L) 150 - 440 K/uL  Troponin I  Result Value Ref Range   Troponin I <0.03 <0.031 ng/mL  CK  Result Value Ref Range   Total CK 556 (H) 49 - 397 U/L      Assessment & Plan:   Problem List Items Addressed This Visit      Musculoskeletal and Integument   DDD (degenerative disc disease), lumbar - Primary    Acute exacerbation of degenerative disc  disease will start prednisone 50 mg a day for 5 days Patient has metal in his back but has had MRIs of his back We will schedule lumbar x-ray and back MRI Patient has appointment to pain clinic in about 3 weeks but can't wait that long      Relevant Medications   predniSONE (DELTASONE) 10 MG tablet   Other Relevant Orders   DG Lumbar Spine 2-3 Views   MR Lumbar Spine W Wo Contrast       Follow up plan: Return for As scheduled and pending results.

## 2015-10-10 NOTE — Assessment & Plan Note (Signed)
Acute exacerbation of degenerative disc disease will start prednisone 50 mg a day for 5 days Patient has metal in his back but has had MRIs of his back We will schedule lumbar x-ray and back MRI Patient has appointment to pain clinic in about 3 weeks but can't wait that long

## 2015-10-17 ENCOUNTER — Telehealth: Payer: Self-pay | Admitting: Family Medicine

## 2015-10-17 NOTE — Telephone Encounter (Signed)
pts leg is hurting extremely bad and he would like to get a refill on trednisone 10mg  sent to [facility] [place] [place].

## 2015-10-17 NOTE — Telephone Encounter (Signed)
Needs to call pain management or wait on Dr. Jeananne Rama. Otherwise he will need to be seen

## 2015-10-17 NOTE — Telephone Encounter (Signed)
Needs to wait for Dr. Jeananne Rama.

## 2015-10-22 ENCOUNTER — Other Ambulatory Visit: Payer: Self-pay | Admitting: Pain Medicine

## 2015-10-31 ENCOUNTER — Ambulatory Visit: Payer: Medicare Other | Attending: Pain Medicine | Admitting: Pain Medicine

## 2015-10-31 ENCOUNTER — Encounter: Payer: Self-pay | Admitting: Pain Medicine

## 2015-10-31 VITALS — BP 116/81 | HR 69 | Temp 96.0°F | Resp 16 | Ht 68.0 in | Wt 203.0 lb

## 2015-10-31 DIAGNOSIS — M5416 Radiculopathy, lumbar region: Secondary | ICD-10-CM | POA: Diagnosis not present

## 2015-10-31 DIAGNOSIS — M5030 Other cervical disc degeneration, unspecified cervical region: Secondary | ICD-10-CM

## 2015-10-31 DIAGNOSIS — M961 Postlaminectomy syndrome, not elsewhere classified: Secondary | ICD-10-CM

## 2015-10-31 DIAGNOSIS — Z9889 Other specified postprocedural states: Secondary | ICD-10-CM | POA: Diagnosis not present

## 2015-10-31 DIAGNOSIS — M47817 Spondylosis without myelopathy or radiculopathy, lumbosacral region: Secondary | ICD-10-CM | POA: Diagnosis not present

## 2015-10-31 DIAGNOSIS — M47816 Spondylosis without myelopathy or radiculopathy, lumbar region: Secondary | ICD-10-CM

## 2015-10-31 DIAGNOSIS — M791 Myalgia: Secondary | ICD-10-CM | POA: Diagnosis not present

## 2015-10-31 DIAGNOSIS — M79606 Pain in leg, unspecified: Secondary | ICD-10-CM | POA: Diagnosis present

## 2015-10-31 DIAGNOSIS — M5116 Intervertebral disc disorders with radiculopathy, lumbar region: Secondary | ICD-10-CM | POA: Insufficient documentation

## 2015-10-31 DIAGNOSIS — M47812 Spondylosis without myelopathy or radiculopathy, cervical region: Secondary | ICD-10-CM

## 2015-10-31 DIAGNOSIS — M47896 Other spondylosis, lumbar region: Secondary | ICD-10-CM | POA: Insufficient documentation

## 2015-10-31 DIAGNOSIS — G90523 Complex regional pain syndrome I of lower limb, bilateral: Secondary | ICD-10-CM | POA: Diagnosis not present

## 2015-10-31 DIAGNOSIS — M533 Sacrococcygeal disorders, not elsewhere classified: Secondary | ICD-10-CM | POA: Diagnosis not present

## 2015-10-31 DIAGNOSIS — M4806 Spinal stenosis, lumbar region: Secondary | ICD-10-CM | POA: Diagnosis not present

## 2015-10-31 DIAGNOSIS — Z981 Arthrodesis status: Secondary | ICD-10-CM

## 2015-10-31 DIAGNOSIS — M792 Neuralgia and neuritis, unspecified: Secondary | ICD-10-CM

## 2015-10-31 DIAGNOSIS — M5481 Occipital neuralgia: Secondary | ICD-10-CM

## 2015-10-31 DIAGNOSIS — M5136 Other intervertebral disc degeneration, lumbar region: Secondary | ICD-10-CM

## 2015-10-31 MED ORDER — OXYCODONE HCL 5 MG PO TABS: ORAL_TABLET | ORAL | Status: DC

## 2015-10-31 NOTE — Patient Instructions (Addendum)
PLAN   Continue present medication oxycodone  F/U PCP Dr. Jeananne Rama for evaliation of  BP and general medical condition. See Dr. Jeananne Rama today for evaluation of diarrhea nausea and for general medical evaluation or go to the emergency room now . Also discuss condition of right foot with Dr. Jeananne Rama at time of evaluation. As you have informed us, Doppler flow studies of the lower extremity were unremarkable. Recommend that you consider seeing podiatrist as well as Dr. Jeananne Rama. Please consult with Dr. Jeananne Rama today for evaluation of your nausea diarrhea general medical condition Discuss Cymbalta with Dr. Jeananne Rama. Cymbalta may be very effective for treating the burning pain of the lower extremities please see Dr. Jeananne Rama regarding Cymbalta as we discussed again today. We will readdress Cymbalta at time of return appointment  F/U surgical evaluation. Follow-up surgical evaluation [facility] as discussed  F/U neurological evaluation. May consider pending follow-up evaluations  May consider radiofrequency rhizolysis or intraspinal procedures pending response to present treatment and F/U evaluation   Patient to call Pain Management Center should patient have concerns prior to scheduled return appointment.

## 2015-10-31 NOTE — Progress Notes (Signed)
Safety precautions to be maintained throughout the outpatient stay will include: orient to surroundings, keep bed in low position, maintain call bell within reach at all times, provide assistance with transfer out of bed and ambulation.  

## 2015-10-31 NOTE — Progress Notes (Signed)
Subjective:    Patient ID: Edward Schneider., male    DOB: Nov 28, 1955, 60 y.o.   MRN: [id number]  HPI  Patient's 60 year old gentleman who returns to pain management for further evaluation and treatment of pain involving the lower extremity regions predominantly with burning tingling sensations of the lower extremities. We have asked patient to follow-up with Dr. Jeananne Rama regarding the addition of Cymbalta today's treatment regimen. The patient will further discuss the addition of Cymbalta with Dr. Jeananne Rama as discussed on today's visit. Patient was with complaint of nausea and diarrhea. We have advised patient to follow up Dr. Jeananne Rama in this regard. We also advised patient to go to emergency room for further evaluation and treatment should he be unable to see Dr. Jeananne Rama today or by tomorrow Friday, 11/01/2015. The patient agreed to suggested treatment plan. At the present time we'll continue oxycodone and avoid interventional treatment. The patient agreed to suggested treatment plan.     Review of Systems     Objective:   Physical Exam  There was tenderness of the spleen is Tenderness of the talus musculature regions of mild degree with mild tenderness of the acromioclavicular and glenohumeral joint region. Patient appeared to be with slightly decreased grip strength and Tinel and Phalen's maneuver were without increase of pain of significant degree. Palpation of the thoracic facet thoracic paraspinal must mature region was attends to palpation of moderate degree in the lower thoracic region with no crepitus of the thoracic region noted. Patient appeared to be with mild tenderness in the region of the upper thoracic region and moderate tends to palpation of the lower thoracic paraspinal must reason associated with moderate muscle spasm. There was unremarkable Spurling's maneuver. Palpation over the lumbar paraspinal muscles lumbar facet region associated with moderate tends to palpation with  lateral bending rotation reproducing moderate discomfort. There was mild tinnitus on the greater trochanteric region iliotibial band region. There was negative clonus negative Homans. No sensory deficit or dermatomal distribution. It is noted. There was questionably decreased sensation of the lower extremities in a stocking-type distribution. There was mild to moderate tenderness of the PSIS and PII S region. Abdomen was nontender with no costovertebral tenderness noted there was negative clonus negative Homans.      Assessment & Plan:  Complex regional pain syndrome of the lower extremities  Lumbar radiculopathy  Degenerative disc disease lumbar spine Multilevel degenerative changes lumbar spine with disc protrusion, facet arthropathy, foraminal narrowing, status post lumbar surgery.    PLAN   Continue present medication oxycodone  F/U PCP Dr. Jeananne Rama for evaliation of  BP and general medical condition. See Dr. Jeananne Rama today for evaluation of diarrhea nausea and for general medical evaluation or go to the emergency room now . Also discuss condition of right foot with Dr. Jeananne Rama at time of evaluation. As you have informed us, Doppler flow studies of the lower extremity were unremarkable. Recommend that you consider seeing podiatrist as well as Dr. Jeananne Rama. Please consult with Dr. Jeananne Rama today for evaluation of your nausea diarrhea general medical condition Discuss Cymbalta with Dr. Jeananne Rama. Cymbalta may be very effective for treating the burning pain of the lower extremities please see Dr. Jeananne Rama regarding Cymbalta as we discussed again today. We will readdress Cymbalta at time of return appointment  F/U surgical evaluation. Follow-up surgical evaluation [facility] as discussed  F/U neurological evaluation. May consider pending follow-up evaluations  May consider radiofrequency rhizolysis or intraspinal procedures pending response to present treatment and F/U evaluation  Patient to  call Pain Management Center should patient have concerns prior to scheduled return appointment.

## 2015-11-01 ENCOUNTER — Encounter: Payer: Self-pay | Admitting: Unknown Physician Specialty

## 2015-11-01 ENCOUNTER — Ambulatory Visit (INDEPENDENT_AMBULATORY_CARE_PROVIDER_SITE_OTHER): Payer: Medicare Other | Admitting: Unknown Physician Specialty

## 2015-11-01 VITALS — BP 165/98 | HR 72 | Temp 98.1°F | Ht 67.2 in | Wt 216.2 lb

## 2015-11-01 DIAGNOSIS — K529 Noninfective gastroenteritis and colitis, unspecified: Secondary | ICD-10-CM | POA: Diagnosis not present

## 2015-11-01 DIAGNOSIS — K52831 Collagenous colitis: Secondary | ICD-10-CM

## 2015-11-01 MED ORDER — METRONIDAZOLE 500 MG PO TABS: 500.0000 mg | ORAL_TABLET | Freq: Three times a day (TID) | ORAL | Status: DC

## 2015-11-01 MED ORDER — CIPROFLOXACIN HCL 500 MG PO TABS: 500.0000 mg | ORAL_TABLET | Freq: Two times a day (BID) | ORAL | Status: DC

## 2015-11-01 NOTE — Progress Notes (Signed)
+-----++++++-  BP 165/98 mmHg  Pulse 72  Temp(Src) 98.1 F (36.7 C)  Ht 5' 7.2" (1.707 m)  Wt 216 lb 3.2 oz (98.068 kg)  BMI 33.66 kg/m2  SpO2 96%   Subjective:    Patient ID: Edward Flesher., male    DOB: 12-May-1956, 60 y.o.   MRN: [id number]  HPI: Edward Mihalick. is a 60 y.o. male  Chief Complaint  Patient presents with  . Diarrhea    pt states he has been having diarrhea fro about 2 weeks now. States since he has been having the stomach issues, he states he has been getting bumps with pus in them all over his body   Pt stating he has had diarrhea for about 2 weeks ever since eating a stew with corn in it.  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  It's unclear if he had a complete treatment as he "walked out on them."  Chart review shows that he was eventually diagnosed with collagenous colitis.    He was fine until recently.  States the diarrhea is not controlled and had an accident yesterday.  States yesterday went about 20-30 times.  Pt states that he "keeps swelling" so that he can't breath.  Noted weight gain from yesterday when he was seen at the pain clinic.    Reviewed past GI notes  Relevant past medical, surgical, family and social history reviewed and updated as indicated. Interim medical history since our last visit reviewed. Allergies and medications reviewed and updated.  Review of Systems  Per HPI unless specifically indicated above     Objective:    BP 165/98 mmHg  Pulse 72  Temp(Src) 98.1 F (36.7 C)  Ht 5' 7.2" (1.707 m)  Wt 216 lb 3.2 oz (98.068 kg)  BMI 33.66 kg/m2  SpO2 96%  Wt Readings from Last 3 Encounters:  11/01/15 216 lb 3.2 oz (98.068 kg)  10/31/15 203 lb (92.08 kg)  10/10/15 205 lb (92.987 kg)    Physical Exam  Constitutional: He is oriented to person, place, and time. He appears well-developed and well-nourished. No distress.  HENT:   Head: Normocephalic and atraumatic.  Eyes: Conjunctivae and lids are normal. Right eye exhibits no discharge. Left eye exhibits no discharge. No scleral icterus.  Neck: Normal range of motion. Neck supple. No JVD present. Carotid bruit is not present.  Cardiovascular: Normal rate, regular rhythm and normal heart sounds.   Pulmonary/Chest: Effort normal and breath sounds normal. No respiratory distress.  Abdominal: Normal appearance and bowel sounds are normal. He exhibits distension. There is no splenomegaly or hepatomegaly. There is tenderness. There is no rebound and no guarding.  Musculoskeletal: Normal range of motion.  Neurological: He is alert and oriented to person, place, and time.  Skin: Skin is warm, dry and intact. No rash noted. No pallor.  Psychiatric: He has a normal mood and affect. His behavior is normal. Judgment and thought content normal.    Results for orders placed or performed during the hospital encounter of [id number]  Basic metabolic panel  Result Value Ref Range   Sodium 141 135 - 145 mmol/L   Potassium 4.0 3.5 - 5.1 mmol/L   Chloride 107 101 - 111 mmol/L   CO2 28 22 - 32 mmol/L   Glucose, Bld 114 (H) 65 - 99 mg/dL   BUN 11 6 - 20 mg/dL   Creatinine,  Ser 0.91 0.61 - 1.24 mg/dL   Calcium 9.0 8.9 - 10.3 mg/dL   GFR calc non Af Amer >60 >60 mL/min   GFR calc Af Amer >60 >60 mL/min   Anion gap 6 5 - 15  CBC  Result Value Ref Range   WBC 7.2 3.8 - 10.6 K/uL   RBC 4.01 (L) 4.40 - 5.90 MIL/uL   Hemoglobin 12.0 (L) 13.0 - 18.0 g/dL   HCT 36.1 (L) 40.0 - 52.0 %   MCV 89.8 80.0 - 100.0 fL   MCH 29.8 26.0 - 34.0 pg   MCHC 33.2 32.0 - 36.0 g/dL   RDW 13.6 11.5 - 14.5 %   Platelets 141 (L) 150 - 440 K/uL  Troponin I  Result Value Ref Range   Troponin I <0.03 <0.031 ng/mL  CK  Result Value Ref Range   Total CK 556 (H) 49 - 397 U/L      Assessment & Plan:   Problem List Items Addressed This Visit      Unprioritized   Collagenous colitis    ? If current  diarrhea is related to this.  Set up GI referral.  Consider Budesonide if symptoms aren't resolved with antibiotics.  RTC mid next week if needed.  Await culture results       Other Visit Diagnoses    Severe diarrhea    -  Primary    Cipro 500 mg BID and Flagyl 500 mg TID for 7 days.  Obtain stool cultures.  Refer back to GI at [facility] due to history.      Relevant Medications    metroNIDAZOLE (FLAGYL) 500 MG tablet    ciprofloxacin (CIPRO) 500 MG tablet    Other Relevant Orders    CBC with Differential/Platelet    Stool Culture    Stool C-Diff Toxin Assay    Comprehensive metabolic panel    Ambulatory referral to Gastroenterology    Ova and parasite examination        Follow up plan: Return if symptoms worsen or fail to improve.

## 2015-11-01 NOTE — Assessment & Plan Note (Signed)
?   If current diarrhea is related to this.  Set up GI referral.  Consider Budesonide if symptoms aren't resolved with antibiotics.  RTC mid next week if needed.  Await culture results

## 2015-11-02 LAB — CBC WITH DIFFERENTIAL/PLATELET
BASOS: 1 %
Basophils Absolute: 0.1 10*3/uL (ref 0.0–0.2)
EOS (ABSOLUTE): 0.1 10*3/uL (ref 0.0–0.4)
EOS: 2 %
HEMATOCRIT: 39.7 % (ref 37.5–51.0)
Hemoglobin: 13.5 g/dL (ref 12.6–17.7)
Immature Grans (Abs): 0 10*3/uL (ref 0.0–0.1)
Immature Granulocytes: 0 %
LYMPHS ABS: 2.9 10*3/uL (ref 0.7–3.1)
Lymphs: 48 %
MCH: 29.7 pg (ref 26.6–33.0)
MCHC: 34 g/dL (ref 31.5–35.7)
MCV: 87 fL (ref 79–97)
MONOS ABS: 0.8 10*3/uL (ref 0.1–0.9)
Monocytes: 12 %
NEUTROS ABS: 2.2 10*3/uL (ref 1.4–7.0)
Neutrophils: 37 %
Platelets: 187 10*3/uL (ref 150–379)
RBC: 4.54 x10E6/uL (ref 4.14–5.80)
RDW: 13.8 % (ref 12.3–15.4)
WBC: 6.1 10*3/uL (ref 3.4–10.8)

## 2015-11-02 LAB — COMPREHENSIVE METABOLIC PANEL
A/G RATIO: 1.6 (ref 1.1–2.5)
ALK PHOS: 67 IU/L (ref 39–117)
ALT: 55 IU/L — ABNORMAL HIGH (ref 0–44)
AST: 32 IU/L (ref 0–40)
Albumin: 4.3 g/dL (ref 3.5–5.5)
BUN / CREAT RATIO: 17 (ref 9–20)
BUN: 16 mg/dL (ref 6–24)
Bilirubin Total: 0.6 mg/dL (ref 0.0–1.2)
CO2: 23 mmol/L (ref 18–29)
CREATININE: 0.93 mg/dL (ref 0.76–1.27)
Calcium: 10 mg/dL (ref 8.7–10.2)
Chloride: 104 mmol/L (ref 96–106)
GFR, EST AFRICAN AMERICAN: 104 mL/min/{1.73_m2} (ref >59)
GFR, EST NON AFRICAN AMERICAN: 90 mL/min/{1.73_m2} (ref >59)
GLOBULIN, TOTAL: 2.7 g/dL (ref 1.5–4.5)
Glucose: 98 mg/dL (ref 65–99)
Potassium: 4.6 mmol/L (ref 3.5–5.2)
SODIUM: 145 mmol/L — AB (ref 134–144)
Total Protein: 7 g/dL (ref 6.0–8.5)

## 2015-11-06 LAB — CLOSTRIDIUM DIFFICILE EIA: C difficile Toxins A+B, EIA: NEGATIVE

## 2015-11-08 LAB — STOOL CULTURE: E COLI SHIGA TOXIN ASSAY: NEGATIVE

## 2015-11-08 LAB — OVA AND PARASITE EXAMINATION

## 2015-11-28 ENCOUNTER — Ambulatory Visit: Payer: Medicare Other | Attending: Pain Medicine | Admitting: Pain Medicine

## 2015-11-28 ENCOUNTER — Encounter: Payer: Self-pay | Admitting: Pain Medicine

## 2015-11-28 VITALS — BP 153/88 | HR 68 | Temp 97.5°F | Resp 16 | Ht 68.0 in | Wt 194.0 lb

## 2015-11-28 DIAGNOSIS — M47896 Other spondylosis, lumbar region: Secondary | ICD-10-CM | POA: Insufficient documentation

## 2015-11-28 DIAGNOSIS — M791 Myalgia: Secondary | ICD-10-CM | POA: Diagnosis not present

## 2015-11-28 DIAGNOSIS — M47817 Spondylosis without myelopathy or radiculopathy, lumbosacral region: Secondary | ICD-10-CM | POA: Diagnosis not present

## 2015-11-28 DIAGNOSIS — M5116 Intervertebral disc disorders with radiculopathy, lumbar region: Secondary | ICD-10-CM | POA: Diagnosis not present

## 2015-11-28 DIAGNOSIS — G90523 Complex regional pain syndrome I of lower limb, bilateral: Secondary | ICD-10-CM | POA: Diagnosis not present

## 2015-11-28 DIAGNOSIS — M5481 Occipital neuralgia: Secondary | ICD-10-CM

## 2015-11-28 DIAGNOSIS — G609 Hereditary and idiopathic neuropathy, unspecified: Secondary | ICD-10-CM

## 2015-11-28 DIAGNOSIS — M5030 Other cervical disc degeneration, unspecified cervical region: Secondary | ICD-10-CM

## 2015-11-28 DIAGNOSIS — M792 Neuralgia and neuritis, unspecified: Secondary | ICD-10-CM

## 2015-11-28 DIAGNOSIS — M533 Sacrococcygeal disorders, not elsewhere classified: Secondary | ICD-10-CM | POA: Diagnosis not present

## 2015-11-28 DIAGNOSIS — M47816 Spondylosis without myelopathy or radiculopathy, lumbar region: Secondary | ICD-10-CM

## 2015-11-28 DIAGNOSIS — M47812 Spondylosis without myelopathy or radiculopathy, cervical region: Secondary | ICD-10-CM

## 2015-11-28 DIAGNOSIS — Z981 Arthrodesis status: Secondary | ICD-10-CM

## 2015-11-28 DIAGNOSIS — M79606 Pain in leg, unspecified: Secondary | ICD-10-CM | POA: Diagnosis present

## 2015-11-28 DIAGNOSIS — M961 Postlaminectomy syndrome, not elsewhere classified: Secondary | ICD-10-CM

## 2015-11-28 DIAGNOSIS — M545 Low back pain: Secondary | ICD-10-CM | POA: Diagnosis present

## 2015-11-28 DIAGNOSIS — Z9889 Other specified postprocedural states: Secondary | ICD-10-CM | POA: Diagnosis not present

## 2015-11-28 DIAGNOSIS — M5416 Radiculopathy, lumbar region: Secondary | ICD-10-CM

## 2015-11-28 DIAGNOSIS — M5136 Other intervertebral disc degeneration, lumbar region: Secondary | ICD-10-CM

## 2015-11-28 DIAGNOSIS — M51369 Other intervertebral disc degeneration, lumbar region without mention of lumbar back pain or lower extremity pain: Secondary | ICD-10-CM

## 2015-11-28 MED ORDER — OXYCODONE HCL 5 MG PO TABS: ORAL_TABLET | ORAL | Status: DC

## 2015-11-28 MED ORDER — DULOXETINE HCL 20 MG PO CPEP: ORAL_CAPSULE | ORAL | Status: DC

## 2015-11-28 NOTE — Progress Notes (Signed)
   Subjective:    Patient ID: Edward Schneider., male    DOB: 1956-06-07, 60 y.o.   MRN: [id number]  HPI  The patient is a 60 year old gentleman who returns to pain management for further evaluation and treatment of pain involving the lower back and lower extremity region with burning stinging sensation of the lower extremities. We have discussed patient beginning Cymbalta and patient will address beginning Cymbalta with Dr. Jeananne Rama. We will consider additional interventional treatment as well as additional evaluations pending response to the present treatment regimen palpation continues oxycodone as prescribed. The patient denies any trauma change in events of daily living the call significant change in symptomatology. The patient is in agreement with suggested treatment plan         Review of Systems     Objective:   Physical Exam  There was tenderness of the splenius capitis and occipitalis musculature region of mild degree with mild tenderness of the cervical facet cervical paraspinal musculature region a mild tenderness of the thoracic facet thoracic paraspinal musculature region. Palpation of the acromioclavicular and glenohumeral joint regions reproduce mild discomfort. Tinel and Phalen's maneuver were with mild increased pain and patient appeared to be with slightly decreased grip strength. Outpatient over the lumbar paraspinal musculature region lumbar facet region was attends to palpation of mild degree with lateral bending rotation extension and palpation of the lumbar facets reproducing mild discomfort. There was mild tenderness of the PSIS and PII S regions. Straight leg raising was tolerates approximately 30 without increased pain with dorsiflexion noted. Negative clonus negative Homans. DTRs were difficult to this patient had difficulty relaxing. Abdomen nontender with no costovertebral tenderness noted.    Assessment & Plan:      Complex regional pain syndrome of the  lower extremities  Lumbar radiculopathy  Degenerative disc disease lumbar spine Multilevel degenerative changes lumbar spine with disc protrusion, facet arthropathy, foraminal narrowing, status post lumbar surgery.      PLAN   Continue present medication oxycodone  F/U PCP Dr. Jeananne Rama for evaliation of  BP and general medical condition Discuss Cymbalta with Dr. Jeananne Rama. Cymbalta may be very effective for treating the burning pain of the lower extremities please see Dr. Jeananne Rama regarding Cymbalta as we discussed again today  F/U surgical evaluation. Follow-up surgical evaluation [facility] as discussed  F/U neurological evaluation. May consider pending follow-up evaluations  May consider radiofrequency rhizolysis or intraspinal procedures pending response to present treatment and F/U evaluation   Patient to call Pain Management Center should patient have concerns prior to scheduled return appointment.

## 2015-11-28 NOTE — Patient Instructions (Signed)
PLAN   Continue present medication oxycodone  F/U PCP Dr. Jeananne Rama for evaliation of  BP and general medical condition Discuss Cymbalta with Dr. Jeananne Rama. Cymbalta may be very effective for treating the burning pain of the lower extremities please see Dr. Jeananne Rama regarding Cymbalta as we discussed again today  F/U surgical evaluation. Follow-up surgical evaluation [facility] as discussed  F/U neurological evaluation. May consider pending follow-up evaluations  May consider radiofrequency rhizolysis or intraspinal procedures pending response to present treatment and F/U evaluation   Patient to call Pain Management Center should patient have concerns prior to scheduled return appointment.

## 2015-11-28 NOTE — Progress Notes (Signed)
Safety precautions to be maintained throughout the outpatient stay will include: orient to surroundings, keep bed in low position, maintain call bell within reach at all times, provide assistance with transfer out of bed and ambulation.  

## 2015-12-04 ENCOUNTER — Ambulatory Visit
Admission: RE | Admit: 2015-12-04 | Discharge: 2015-12-04 | Disposition: A | Discharge status: Home / Self Care | Payer: Medicare Other | Source: Ambulatory Visit | Attending: Family Medicine | Admitting: Family Medicine

## 2015-12-04 DIAGNOSIS — M5136 Other intervertebral disc degeneration, lumbar region: Secondary | ICD-10-CM | POA: Diagnosis not present

## 2015-12-04 DIAGNOSIS — M4690 Unspecified inflammatory spondylopathy, site unspecified: Secondary | ICD-10-CM | POA: Diagnosis not present

## 2015-12-04 DIAGNOSIS — M4326 Fusion of spine, lumbar region: Secondary | ICD-10-CM | POA: Diagnosis not present

## 2015-12-04 DIAGNOSIS — Z981 Arthrodesis status: Secondary | ICD-10-CM | POA: Diagnosis not present

## 2015-12-04 DIAGNOSIS — M4806 Spinal stenosis, lumbar region: Secondary | ICD-10-CM | POA: Insufficient documentation

## 2015-12-04 MED ORDER — GADOBENATE DIMEGLUMINE 529 MG/ML IV SOLN
20.0000 mL | Freq: Once | INTRAVENOUS | Status: AC | PRN
  Administered 2015-12-04: 20 mL via INTRAVENOUS

## 2015-12-10 ENCOUNTER — Telehealth: Payer: Self-pay | Admitting: Unknown Physician Specialty

## 2015-12-10 NOTE — Telephone Encounter (Signed)
Called and spoke to patient. He said he wanted to schedule with Dr. Jeananne Rama so I scheduled him an appointment for 12/12/15 at 1:30. I asked patient if he wanted Dr. Jeananne Rama to call him before then about the MRI he ordered, but patient said he would talk to him about it at his appointment.

## 2015-12-10 NOTE — Telephone Encounter (Signed)
You can tell him that his results were negative.  No stool infection found.

## 2015-12-10 NOTE — Telephone Encounter (Addendum)
He needs to go to GI or f/u with Dr. Jeananne Rama.  We can refer to Dr. Allen Norris if he would like. Dr. Jeananne Rama ordered the MRI

## 2015-12-10 NOTE — Telephone Encounter (Signed)
Pt would like a call back to discuss lab results

## 2015-12-10 NOTE — Telephone Encounter (Signed)
Called and let patient know about results. Patient states he has had swelling in stomach, diarrhea, no energy, fatigue, and acne that is all over his body. States he has never had acne before, but this acne has white pus in them. Patient states he is still having these symptoms and does not know why. He also states [facility] did not help him and he does not want to go back to see them. States they charged him $400. Patient states he needs something done. Patient also says he has not heard anything about his MRI.

## 2015-12-10 NOTE — Telephone Encounter (Signed)
Routing to provider  

## 2015-12-12 ENCOUNTER — Ambulatory Visit (INDEPENDENT_AMBULATORY_CARE_PROVIDER_SITE_OTHER): Payer: Medicare Other | Admitting: Family Medicine

## 2015-12-12 ENCOUNTER — Encounter: Payer: Self-pay | Admitting: Family Medicine

## 2015-12-12 VITALS — BP 119/84 | HR 66 | Temp 98.5°F | Ht 67.1 in | Wt 221.0 lb

## 2015-12-12 DIAGNOSIS — J449 Chronic obstructive pulmonary disease, unspecified: Secondary | ICD-10-CM | POA: Diagnosis not present

## 2015-12-12 DIAGNOSIS — K52831 Collagenous colitis: Secondary | ICD-10-CM

## 2015-12-12 DIAGNOSIS — Z91038 Other insect allergy status: Secondary | ICD-10-CM

## 2015-12-12 DIAGNOSIS — Z91030 Bee allergy status: Secondary | ICD-10-CM | POA: Insufficient documentation

## 2015-12-12 MED ORDER — UMECLIDINIUM-VILANTEROL 62.5-25 MCG/INH IN AEPB: 1.0000 | INHALATION_SPRAY | Freq: Every day | RESPIRATORY_TRACT | Status: DC

## 2015-12-12 MED ORDER — EPINEPHRINE 0.3 MG/0.3ML IJ SOAJ: 0.3000 mg | Freq: Once | INTRAMUSCULAR | Status: DC

## 2015-12-12 NOTE — Assessment & Plan Note (Signed)
Reviewed COPD patient has no inhalers gave prescription for Anoro used first dose here in the office and has already helped.

## 2015-12-12 NOTE — Progress Notes (Signed)
BP 119/84 mmHg  Pulse 66  Temp(Src) 98.5 F (36.9 C)  Ht 5' 7.1" (1.704 m)  Wt 221 lb (100.245 kg)  BMI 34.52 kg/m2  SpO2 97%   Subjective:    Patient ID: Edward Flesher., male    DOB: [birth year], 60 y.o.   MRN: [id number]  HPI: Edward Deguia. is a 60 y.o. male  Chief Complaint  Patient presents with  . stomach swelling  . trouble breathing    reviewed patient's collagenous colitis history patient's having trouble responding to any medications still having a great deal of  bloating and frequent diarrhea..   has not kept a food log to determined particular irritants encouraged to do so  Patient's disenchanted with his current gastroenterologist wants to see someone else   Patient's COPD getting worse doesn't have an inhalers  hasn't smoked in 7 months and done well  Needs refill on his EpiPen as is allergic to bee stings  Relevant past medical, surgical, family and social history reviewed and updated as indicated. Interim medical history since our last visit reviewed. Allergies and medications reviewed and updated.  Review of Systems  Constitutional: Negative.   Respiratory: Negative.   Cardiovascular: Negative.     Per HPI unless specifically indicated above     Objective:    BP 119/84 mmHg  Pulse 66  Temp(Src) 98.5 F (36.9 C)  Ht 5' 7.1" (1.704 m)  Wt 221 lb (100.245 kg)  BMI 34.52 kg/m2  SpO2 97%  Wt Readings from Last 3 Encounters:  12/12/15 221 lb (100.245 kg)  11/28/15 194 lb (87.998 kg)  11/01/15 216 lb 3.2 oz (98.068 kg)    Physical Exam  Constitutional: He is oriented to person, place, and time. He appears well-developed and well-nourished. No distress.  HENT:  Head: Normocephalic and atraumatic.  Right Ear: Hearing normal.  Left Ear: Hearing normal.  Nose: Nose normal.  Eyes: Conjunctivae and lids are normal. Right eye exhibits no discharge. Left eye exhibits no discharge. No scleral icterus.  Cardiovascular: Normal rate, regular rhythm  and normal heart sounds.   Pulmonary/Chest: Effort normal and breath sounds normal. No respiratory distress.  Musculoskeletal: Normal range of motion.  Neurological: He is alert and oriented to person, place, and time.  Skin: Skin is intact. No rash noted.  Psychiatric: He has a normal mood and affect. His speech is normal and behavior is normal. Judgment and thought content normal. Cognition and memory are normal.    Results for orders placed or performed in visit on 11/01/15  Stool C-Diff Toxin Assay  Result Value Ref Range   C difficile Toxins A+B, EIA Negative Negative  Ova and parasite examination  Result Value Ref Range   OVA + PARASITE EXAM Final report    O&P result 1 Comment   Stool culture  Result Value Ref Range   Salmonella/Shigella Screen Final report    RESULT 1 Comment    Campylobacter Culture Final report    RESULT 1 Comment    E coli, Shiga toxin Assay Negative Negative  CBC with Differential/Platelet  Result Value Ref Range   WBC 6.1 3.4 - 10.8 x10E3/uL   RBC 4.54 4.14 - 5.80 x10E6/uL   Hemoglobin 13.5 12.6 - 17.7 g/dL   Hematocrit 39.7 37.5 - 51.0 %   MCV 87 79 - 97 fL   MCH 29.7 26.6 - 33.0 pg   MCHC 34.0 31.5 - 35.7 g/dL   RDW 13.8 12.3 - 15.4 %  Platelets 187 150 - 379 x10E3/uL   Neutrophils 37 %   Lymphs 48 %   Monocytes 12 %   Eos 2 %   Basos 1 %   Neutrophils Absolute 2.2 1.4 - 7.0 x10E3/uL   Lymphocytes Absolute 2.9 0.7 - 3.1 x10E3/uL   Monocytes Absolute 0.8 0.1 - 0.9 x10E3/uL   EOS (ABSOLUTE) 0.1 0.0 - 0.4 x10E3/uL   Basophils Absolute 0.1 0.0 - 0.2 x10E3/uL   Immature Granulocytes 0 %   Immature Grans (Abs) 0.0 0.0 - 0.1 x10E3/uL  Comprehensive metabolic panel  Result Value Ref Range   Glucose 98 65 - 99 mg/dL   BUN 16 6 - 24 mg/dL   Creatinine, Ser 0.93 0.76 - 1.27 mg/dL   GFR calc non Af Amer 90 >59 mL/min/1.73   GFR calc Af Amer 104 >59 mL/min/1.73   BUN/Creatinine Ratio 17 9 - 20   Sodium 145 (H) 134 - 144 mmol/L   Potassium 4.6  3.5 - 5.2 mmol/L   Chloride 104 96 - 106 mmol/L   CO2 23 18 - 29 mmol/L   Calcium 10.0 8.7 - 10.2 mg/dL   Total Protein 7.0 6.0 - 8.5 g/dL   Albumin 4.3 3.5 - 5.5 g/dL   Globulin, Total 2.7 1.5 - 4.5 g/dL   Albumin/Globulin Ratio 1.6 1.1 - 2.5   Bilirubin Total 0.6 0.0 - 1.2 mg/dL   Alkaline Phosphatase 67 39 - 117 IU/L   AST 32 0 - 40 IU/L   ALT 55 (H) 0 - 44 IU/L      Assessment & Plan:   Problem List Items Addressed This Visit      Respiratory   COPD (chronic obstructive pulmonary disease) ([place])    Reviewed COPD patient has no inhalers gave prescription for Anoro used first dose here in the office and has already helped.      Relevant Medications   umeclidinium-vilanterol (ANORO ELLIPTA) 62.5-25 MCG/INH AEPB     Digestive   Collagenous colitis    Patient with ongoing collagenous colitis problems with frequent diarrhea and bloating wants to change gastroenterologist will set up another appointment not at [facility] clinic.        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

## 2015-12-12 NOTE — Assessment & Plan Note (Signed)
Patient with ongoing collagenous colitis problems with frequent diarrhea and bloating wants to change gastroenterologist will set up another appointment not at [facility] clinic.

## 2015-12-12 NOTE — Assessment & Plan Note (Signed)
We will give EpiPen refills

## 2015-12-15 ENCOUNTER — Encounter: Payer: Self-pay | Admitting: Emergency Medicine

## 2015-12-15 ENCOUNTER — Inpatient Hospital Stay
Admission: EM | Admit: 2015-12-15 | Discharge: 2015-12-17 | DRG: 372 | Disposition: A | Discharge status: Home / Self Care | Payer: Medicare Other | Attending: Internal Medicine | Admitting: Internal Medicine

## 2015-12-15 ENCOUNTER — Emergency Department: Payer: Medicare Other

## 2015-12-15 DIAGNOSIS — Z79899 Other long term (current) drug therapy: Secondary | ICD-10-CM

## 2015-12-15 DIAGNOSIS — K625 Hemorrhage of anus and rectum: Secondary | ICD-10-CM | POA: Diagnosis not present

## 2015-12-15 DIAGNOSIS — Z8261 Family history of arthritis: Secondary | ICD-10-CM

## 2015-12-15 DIAGNOSIS — A0472 Enterocolitis due to Clostridium difficile, not specified as recurrent: Secondary | ICD-10-CM | POA: Diagnosis present

## 2015-12-15 DIAGNOSIS — M5136 Other intervertebral disc degeneration, lumbar region: Secondary | ICD-10-CM | POA: Diagnosis not present

## 2015-12-15 DIAGNOSIS — Z9889 Other specified postprocedural states: Secondary | ICD-10-CM | POA: Diagnosis not present

## 2015-12-15 DIAGNOSIS — Z87891 Personal history of nicotine dependence: Secondary | ICD-10-CM

## 2015-12-15 DIAGNOSIS — G894 Chronic pain syndrome: Secondary | ICD-10-CM | POA: Diagnosis not present

## 2015-12-15 DIAGNOSIS — F418 Other specified anxiety disorders: Secondary | ICD-10-CM | POA: Diagnosis present

## 2015-12-15 DIAGNOSIS — Z8505 Personal history of malignant neoplasm of liver: Secondary | ICD-10-CM | POA: Diagnosis not present

## 2015-12-15 DIAGNOSIS — Z841 Family history of disorders of kidney and ureter: Secondary | ICD-10-CM

## 2015-12-15 DIAGNOSIS — Z9049 Acquired absence of other specified parts of digestive tract: Secondary | ICD-10-CM

## 2015-12-15 DIAGNOSIS — Z833 Family history of diabetes mellitus: Secondary | ICD-10-CM | POA: Diagnosis not present

## 2015-12-15 DIAGNOSIS — K922 Gastrointestinal hemorrhage, unspecified: Secondary | ICD-10-CM | POA: Diagnosis not present

## 2015-12-15 DIAGNOSIS — R109 Unspecified abdominal pain: Secondary | ICD-10-CM | POA: Diagnosis not present

## 2015-12-15 DIAGNOSIS — Z885 Allergy status to narcotic agent status: Secondary | ICD-10-CM | POA: Diagnosis not present

## 2015-12-15 DIAGNOSIS — K921 Melena: Secondary | ICD-10-CM | POA: Diagnosis present

## 2015-12-15 DIAGNOSIS — Z8249 Family history of ischemic heart disease and other diseases of the circulatory system: Secondary | ICD-10-CM

## 2015-12-15 DIAGNOSIS — J449 Chronic obstructive pulmonary disease, unspecified: Secondary | ICD-10-CM | POA: Diagnosis present

## 2015-12-15 DIAGNOSIS — Z888 Allergy status to other drugs, medicaments and biological substances status: Secondary | ICD-10-CM

## 2015-12-15 DIAGNOSIS — I10 Essential (primary) hypertension: Secondary | ICD-10-CM | POA: Diagnosis present

## 2015-12-15 DIAGNOSIS — Z7951 Long term (current) use of inhaled steroids: Secondary | ICD-10-CM | POA: Diagnosis not present

## 2015-12-15 DIAGNOSIS — R197 Diarrhea, unspecified: Secondary | ICD-10-CM | POA: Diagnosis not present

## 2015-12-15 DIAGNOSIS — R1084 Generalized abdominal pain: Secondary | ICD-10-CM | POA: Diagnosis not present

## 2015-12-15 DIAGNOSIS — G43909 Migraine, unspecified, not intractable, without status migrainosus: Secondary | ICD-10-CM | POA: Diagnosis not present

## 2015-12-15 DIAGNOSIS — J45909 Unspecified asthma, uncomplicated: Secondary | ICD-10-CM | POA: Diagnosis not present

## 2015-12-15 DIAGNOSIS — K760 Fatty (change of) liver, not elsewhere classified: Secondary | ICD-10-CM | POA: Diagnosis not present

## 2015-12-15 DIAGNOSIS — A047 Enterocolitis due to Clostridium difficile: Secondary | ICD-10-CM | POA: Diagnosis not present

## 2015-12-15 LAB — ABO/RH: ABO/RH(D): A POS

## 2015-12-15 LAB — COMPREHENSIVE METABOLIC PANEL
ALK PHOS: 51 U/L (ref 38–126)
ALT: 40 U/L (ref 17–63)
ANION GAP: 8 (ref 5–15)
AST: 30 U/L (ref 15–41)
Albumin: 4.2 g/dL (ref 3.5–5.0)
BILIRUBIN TOTAL: 0.7 mg/dL (ref 0.3–1.2)
BUN: 9 mg/dL (ref 6–20)
CALCIUM: 9.7 mg/dL (ref 8.9–10.3)
CO2: 24 mmol/L (ref 22–32)
Chloride: 107 mmol/L (ref 101–111)
Creatinine, Ser: 0.83 mg/dL (ref 0.61–1.24)
GFR calc non Af Amer: >60 mL/min (ref >60)
Glucose, Bld: 102 mg/dL — ABNORMAL HIGH (ref 65–99)
POTASSIUM: 4.2 mmol/L (ref 3.5–5.1)
SODIUM: 139 mmol/L (ref 135–145)
TOTAL PROTEIN: 7.2 g/dL (ref 6.5–8.1)

## 2015-12-15 LAB — CBC WITH DIFFERENTIAL/PLATELET
BASOS PCT: 1 %
Basophils Absolute: 0.1 10*3/uL (ref 0–0.1)
EOS ABS: 0.3 10*3/uL (ref 0–0.7)
Eosinophils Relative: 4 %
HEMATOCRIT: 41.5 % (ref 40.0–52.0)
HEMOGLOBIN: 14 g/dL (ref 13.0–18.0)
LYMPHS ABS: 2.7 10*3/uL (ref 1.0–3.6)
Lymphocytes Relative: 38 %
MCH: 29.3 pg (ref 26.0–34.0)
MCHC: 33.7 g/dL (ref 32.0–36.0)
MCV: 86.8 fL (ref 80.0–100.0)
MONO ABS: 0.8 10*3/uL (ref 0.2–1.0)
MONOS PCT: 12 %
NEUTROS PCT: 45 %
Neutro Abs: 3.2 10*3/uL (ref 1.4–6.5)
Platelets: 185 10*3/uL (ref 150–440)
RBC: 4.79 MIL/uL (ref 4.40–5.90)
RDW: 14 % (ref 11.5–14.5)
WBC: 7 10*3/uL (ref 3.8–10.6)

## 2015-12-15 LAB — TYPE AND SCREEN
ABO/RH(D): A POS
Antibody Screen: NEGATIVE

## 2015-12-15 LAB — TROPONIN I

## 2015-12-15 LAB — PROTIME-INR
INR: 1
PROTHROMBIN TIME: 13.4 s (ref 11.4–15.0)

## 2015-12-15 LAB — APTT: aPTT: 28 seconds (ref 24–36)

## 2015-12-15 LAB — LIPASE, BLOOD: Lipase: 23 U/L (ref 11–51)

## 2015-12-15 MED ORDER — ACETAMINOPHEN 325 MG PO TABS: 650.0000 mg | ORAL_TABLET | Freq: Four times a day (QID) | ORAL | Status: DC | PRN

## 2015-12-15 MED ORDER — HYDROMORPHONE HCL 1 MG/ML IJ SOLN
1.0000 mg | Freq: Once | INTRAMUSCULAR | Status: AC
  Administered 2015-12-15: 1 mg via INTRAVENOUS
  Filled 2015-12-15: qty 1

## 2015-12-15 MED ORDER — ONDANSETRON HCL 4 MG/2ML IJ SOLN
4.0000 mg | Freq: Once | INTRAMUSCULAR | Status: AC
  Administered 2015-12-15: 4 mg via INTRAVENOUS
  Filled 2015-12-15: qty 2

## 2015-12-15 MED ORDER — IOHEXOL 300 MG/ML  SOLN
100.0000 mL | Freq: Once | INTRAMUSCULAR | Status: AC | PRN
  Administered 2015-12-15: 100 mL via INTRAVENOUS

## 2015-12-15 MED ORDER — HYDROMORPHONE HCL 1 MG/ML IJ SOLN
1.0000 mg | INTRAMUSCULAR | Status: DC | PRN
  Administered 2015-12-16 (×5): 1 mg via INTRAVENOUS
  Filled 2015-12-15 (×5): qty 1

## 2015-12-15 MED ORDER — UMECLIDINIUM-VILANTEROL 62.5-25 MCG/INH IN AEPB: 1.0000 | INHALATION_SPRAY | Freq: Every day | RESPIRATORY_TRACT | Status: DC

## 2015-12-15 MED ORDER — OXYCODONE HCL 5 MG PO TABS
5.0000 mg | ORAL_TABLET | ORAL | Status: DC | PRN
  Administered 2015-12-15 – 2015-12-17 (×4): 5 mg via ORAL
  Filled 2015-12-15 (×4): qty 1

## 2015-12-15 MED ORDER — MORPHINE SULFATE (PF) 4 MG/ML IV SOLN
4.0000 mg | Freq: Once | INTRAVENOUS | Status: AC
  Administered 2015-12-15: 4 mg via INTRAVENOUS
  Filled 2015-12-15: qty 1

## 2015-12-15 MED ORDER — SODIUM CHLORIDE 0.9 % IV BOLUS (SEPSIS)
1000.0000 mL | Freq: Once | INTRAVENOUS | Status: AC
  Administered 2015-12-15: 1000 mL via INTRAVENOUS

## 2015-12-15 MED ORDER — ACETAMINOPHEN 650 MG RE SUPP
650.0000 mg | Freq: Four times a day (QID) | RECTAL | Status: DC | PRN
Start: 2015-12-15 — End: 2015-12-17

## 2015-12-15 MED ORDER — IOHEXOL 240 MG/ML SOLN
25.0000 mL | Freq: Once | INTRAMUSCULAR | Status: AC | PRN
  Administered 2015-12-15: 25 mL via ORAL

## 2015-12-15 NOTE — ED Provider Notes (Signed)
[facility] Emergency Department Provider Note  ____________________________________________  Time seen: Approximately 1:59 PM  I have reviewed the triage vital signs and the nursing notes.   HISTORY  Chief Complaint Abdominal Pain    HPI Edward Schneider. is a 60 y.o. male of asthma, hypertension, COPD, history of collagenous colitis presents for evaluation of one week of worsening abdominal pain and distention, initially mild to moderate however severe today, no modifying factors. Patient reports that today he has also had 3 episodes of bright red blood per rectum. No vomiting but he has been nauseated. No hematemesis. No history of prior GI bleed. He reports that this morning around 11:00 am he was at home when he "felt a pop" in his abdomen. No fevers. He reports that he is now having some chest pain but reports "it's because my belly pain is so bad".   Past Medical History  Diagnosis Date  . C. difficile diarrhea   . Asthma   . Cancer [facility]) June 2016    liver cancer  . Allergy   . Hypertension   . Chronic pain   . DDD (degenerative disc disease), lumbar   . DDD (degenerative disc disease), cervical   . Low blood sugar     Patient Active Problem List   Diagnosis Date Noted  . Allergy to bee sting 12/12/2015  . Collagenous colitis 11/01/2015  . Hypercholesterolemia 09/23/2015  . Migraine headache 08/21/2015  . Neuralgia 05/22/2015  . Hereditary and idiopathic peripheral neuropathy 05/22/2015  . COPD (chronic obstructive pulmonary disease) ([place]) 04/25/2015  . DDD (degenerative disc disease), cervical 03/27/2015  . Status post cervical spinal fusion 03/27/2015  . DDD (degenerative disc disease), lumbar 03/27/2015  . Cervical post-laminectomy syndrome 03/27/2015  . Cervical facet syndrome 03/27/2015  . Facet syndrome, lumbar 03/27/2015  . Sacroiliac joint dysfunction 03/27/2015  . Bilateral occipital neuralgia 03/27/2015  . Back pain, chronic  12/24/2014    Past Surgical History  Procedure Laterality Date  . Neck surgery    . Back surgery    . Appendectomy    . Spleen surg    . Spleen surgery    . Knee surgery Right     Current Outpatient Rx  Name  Route  Sig  Dispense  Refill  . DULoxetine (CYMBALTA) 20 MG capsule      Limit 1 tablet by mouth per day if tolerated   30 capsule   0   . EPINEPHrine (EPIPEN 2-PAK) 0.3 mg/0.3 mL IJ SOAJ injection   Intramuscular   Inject 0.3 mLs (0.3 mg total) into the muscle once.   2 Device   12   . [place] Ginseng 520 MG CAPS   Oral   Take by mouth.         . MULTIPLE VITAMINS PO   Oral   Take 1 capsule by mouth every morning.         Marland Kitchen oxyCODONE (ROXICODONE) 5 MG immediate release tablet      Limit one tablet by mouth 3-6  times per day if tolerated   170 tablet   0   . SUMAtriptan (IMITREX) 100 MG tablet   Oral   Take 1 tablet (100 mg total) by mouth once. May repeat in 2 hours if headache persists or recurs.   10 tablet   6   . umeclidinium-vilanterol (ANORO ELLIPTA) 62.5-25 MCG/INH AEPB   Inhalation   Inhale 1 puff into the lungs daily.   1 each  12     Allergies Bee pollen; Shrimp; Fentanyl; Gabapentin; Morphine; Morphine and related; Simvastatin; and Buprenorphine hcl  Family History  Problem Relation Age of Onset  . Arthritis Mother   . Diabetes Mother   . Kidney disease Mother   . Heart disease Mother   . Hypertension Mother   . Arthritis Father   . Hearing loss Father   . Hypertension Father   . Diabetes Sister   . Heart disease Sister     Social History Social History  Substance Use Topics  . Smoking status: Former Smoker -- 50 years    Types: Cigarettes    Quit date: 07/05/2015  . Smokeless tobacco: Never Used  . Alcohol Use: No    Review of Systems Constitutional: No fever/chills Eyes: No visual changes. ENT: No sore throat. Cardiovascular: + chest pain. Respiratory: Denies shortness of breath. Gastrointestinal: +  abdominal pain.  + nausea, no vomiting.  No diarrhea.  No constipation. Genitourinary: Negative for dysuria. Musculoskeletal: Negative for back pain. Skin: Negative for rash. Neurological: Negative for headaches, focal weakness or numbness.  10-point ROS otherwise negative.  ____________________________________________   PHYSICAL EXAM:  Filed Vitals:   12/15/15 1515 12/15/15 1530 12/15/15 1545 12/15/15 1600  BP:  137/101  140/96  Pulse: 63 66 64 59  Temp:      TempSrc:      Resp: 22 27 25 10   Height:      Weight:      SpO2: 99% 100% 99% 96%     Constitutional: Alert and oriented. In distress due to pain. Eyes: Conjunctivae are normal. PERRL. EOMI. Head: Atraumatic. Nose: No congestion/rhinnorhea. Mouth/Throat: Mucous membranes are moist.  Oropharynx non-erythematous. Neck: No stridor.  supple without meningismus.  Cardiovascular: Normal rate, regular rhythm. Grossly normal heart sounds.  Good peripheral circulation. Respiratory: Normal respiratory effort.  No retractions. Lungs CTAB. Gastrointestinal: Distended with diffuse tenderness to palpation.  No CVA tenderness. Genitourinary:  deferred  Rectal: multiple external hemorrhoids, clear mucus in the rectal vault is guaiac negative. Musculoskeletal: No lower extremity tenderness nor edema.  No joint effusions. Neurologic:  Normal speech and language. No gross focal neurologic deficits are appreciated.  Skin:  Skin is warm, dry and intact. No rash noted. Psychiatric: Mood and affect are normal. Speech and behavior are normal.  ____________________________________________   LABS (all labs ordered are listed, but only abnormal results are displayed)  Labs Reviewed  COMPREHENSIVE METABOLIC PANEL - Abnormal; Notable for the following:    Glucose, Bld 102 (*)    All other components within normal limits  CBC WITH DIFFERENTIAL/PLATELET  LIPASE, BLOOD  TROPONIN I  PROTIME-INR  APTT  URINALYSIS COMPLETEWITH MICROSCOPIC  ([facility] ONLY)  TYPE AND SCREEN  ABO/RH   ____________________________________________  EKG  ED ECG REPORT I, Joanne Gavel, the attending physician, personally viewed and interpreted this ECG.   Date: 12/15/2015  EKG Time: 14:04  Rate: 70  Rhythm: normal EKG, normal sinus rhythm  Axis: normal  Intervals:none  ST&T Change: No acute ST elevation.  ____________________________________________  RADIOLOGY  3 view abdomen IMPRESSION: No acute findings. Mild cardiomegaly.  CT abdomen and pelvis  IMPRESSION: No acute findings identified within the abdomen or pelvis.  Increased diffuse hepatic steatosis compared prior exam.   ____________________________________________   PROCEDURES  Procedure(s) performed: None  Critical Care performed: No  ____________________________________________   INITIAL IMPRESSION / ASSESSMENT AND PLAN / ED COURSE  Pertinent labs & imaging results that were available during my care of the  patient were reviewed by me and considered in my medical decision making (see chart for details).  Aldren Bowar. is a 60 y.o. male of asthma, hypertension, COPD, history of collagenous colitis presents for evaluation of one week of worsening abdominal pain and distention. On exam, he appears to be in pain. He has diffuse abdominal distention with tenderness. Plan for labs, 3 view of the abdomen, likely CT of the abdomen and pelvis. We'll treat his pain and reassess for disposition.   ----------------------------------------- 3:33 PM on 12/15/2015 -----------------------------------------  Plain films of the abdomen negative for any obstruction or perforation. Labs reviewed. CBC, CMP, lipase unremarkable. Negative troponin, EKG normal and patient had a negative stress test in November of last year. Suspect his chest pain is actually an extension of his abdominal pain. Doubt ACS, acute aortic dissection or PE. Pain improved. Awaiting CT of the abdomen  pelvis.   ----------------------------------------- 5:15 PM on 12/15/2015 -----------------------------------------  Patient reports continued severe pain however he appears much more comfortable at this time. CT of the abdomen and pelvis is unremarkable and I am unsure of the cause of his pain however given persistent/intractable pain as well as his report of bright red blood per rectum, I discussed the case with Dr. Earleen Newport, hospitalist, for admission. Suspect his bleeding could be secondary to hemorrhoids however he is at the age where he could be symptomatic from diverticular disease/diverticular bleed though he has had no witnessed blood per rectum here in the ER. ____________________________________________   FINAL CLINICAL IMPRESSION(S) / ED DIAGNOSES  Final diagnoses:  Generalized abdominal pain  Intractable abdominal pain  BRBPR (bright red blood per rectum)      Joanne Gavel, MD 12/15/15 1719

## 2015-12-15 NOTE — H&P (Signed)
[place] [facility] [place] NAME: Edward Schneider    MR#:  [id number]  DATE OF BIRTH:  1955-12-08  DATE OF ADMISSION:  12/15/2015  PRIMARY CARE PHYSICIAN: Golden Pop, MD   REQUESTING/REFERRING PHYSICIAN: Girard Cooter  CHIEF COMPLAINT:   Chief Complaint  Patient presents with  . Abdominal Pain    HISTORY OF PRESENT ILLNESS:  Edward Schneider  is a 60 y.o. male with a known history of collagenous colitis as per gastroenterology note. He had severe abdominal pain this morning, 10 out of 10 intensity. Nothing made it better or worse until he got to the ER and got IV dilaudid. He had 3 episodes of bright red blood per rectum after hearing something pop in his abdomen. In the ER, CT scan of the abdomen and pelvis was negative, rectal exam by ER physician was negative and hemoglobin was stable. Patient states that he's been having diarrhea going on now for 3 years and nobody can figure it out.  PAST MEDICAL HISTORY:   Past Medical History  Diagnosis Date  . C. difficile diarrhea   . Asthma   . Cancer [facility]) June 2016    liver cancer  . Allergy   . Hypertension   . Chronic pain   . DDD (degenerative disc disease), lumbar   . DDD (degenerative disc disease), cervical   . Low blood sugar     PAST SURGICAL HISTORY:   Past Surgical History  Procedure Laterality Date  . Neck surgery    . Back surgery    . Appendectomy    . Spleen surg    . Spleen surgery    . Knee surgery Right     SOCIAL HISTORY:   Social History  Substance Use Topics  . Smoking status: Former Smoker -- 50 years    Types: Cigarettes    Quit date: 07/05/2015  . Smokeless tobacco: Never Used  . Alcohol Use: No    FAMILY HISTORY:   Family History  Problem Relation Age of Onset  . Arthritis Mother   . Diabetes Mother   . Kidney disease Mother   . Heart disease Mother   . Hypertension Mother   . Arthritis Father   . Hearing loss Father   . Hypertension Father    . Diabetes Sister   . Heart disease Sister     DRUG ALLERGIES:   Allergies  Allergen Reactions  . Bee Pollen Anaphylaxis    Died 3 times when stung by bees. Carries Epi-pen at all times.  . Shrimp [Shellfish Allergy] Anaphylaxis  . Fentanyl Itching  . Gabapentin Diarrhea    Severe diarrhea which caused incontinence, loss of appetite and weight loss.   . Morphine Itching  . Morphine And Related Itching  . Simvastatin Diarrhea  . Buprenorphine Hcl Itching    REVIEW OF SYSTEMS:  CONSTITUTIONAL: Positive for fever and chills, weight gain, positive fatigue and weakness.  EYES: Positive for blurred vision. Wears glasses. EARS, NOSE, AND THROAT: Positive for tinnitus. Positive for sore throat. Positive for occasional dysphagia. Positive for runny nose. RESPIRATORY: No cough, positive for shortness of breath, no wheezing or hemoptysis.  CARDIOVASCULAR: No chest pain, orthopnea, edema.  GASTROINTESTINAL: Positive for nausea, vomiting, diarrhea and abdominal pain. Positive for 3 episodes of bright red blood per rectum. GENITOURINARY: Positive for dysuria, no hematuria.  ENDOCRINE: No polyuria, nocturia,  HEMATOLOGY: No anemia, easy bruising or bleeding SKIN: Gets rashes all the time MUSCULOSKELETAL: Positive for joint  pain all over.   NEUROLOGIC: No tingling, numbness, weakness.  PSYCHIATRY: History of anxiety and depression.   MEDICATIONS AT HOME:   Prior to Admission medications   Medication Sig Start Date End Date Taking? Authorizing Provider  DULoxetine (CYMBALTA) 20 MG capsule Limit 1 tablet by mouth per day if tolerated 11/28/15   Mohammed Kindle, MD  EPINEPHrine (EPIPEN 2-PAK) 0.3 mg/0.3 mL IJ SOAJ injection Inject 0.3 mLs (0.3 mg total) into the muscle once. 12/12/15   Guadalupe Maple, MD  [place] Ginseng 520 MG CAPS Take by mouth.    Historical Provider, MD  MULTIPLE VITAMINS PO Take 1 capsule by mouth every morning.    Historical Provider, MD  oxyCODONE (ROXICODONE) 5 MG  immediate release tablet Limit one tablet by mouth 3-6  times per day if tolerated 11/28/15   Mohammed Kindle, MD  SUMAtriptan (IMITREX) 100 MG tablet Take 1 tablet (100 mg total) by mouth once. May repeat in 2 hours if headache persists or recurs. 09/23/15   Guadalupe Maple, MD  umeclidinium-vilanterol (ANORO ELLIPTA) 62.5-25 MCG/INH AEPB Inhale 1 puff into the lungs daily. 12/12/15   Guadalupe Maple, MD    Patient states that he takes an inhaler and oxycodone only. Medication reconciliation still undergoing. Patient does not take Cymbalta.  VITAL SIGNS:  Blood pressure 140/96, pulse 59, temperature 98.2 F (36.8 C), temperature source Oral, resp. rate 10, height 5\' 8"  (1.727 m), weight 97.07 kg (214 lb), SpO2 96 %.  PHYSICAL EXAMINATION:  GENERAL:  60 y.o.-year-old patient lying in the bed with no acute distress.  EYES: Pupils equal, round, reactive to light and accommodation. No scleral icterus. Extraocular muscles intact.  HEENT: Head atraumatic, normocephalic. Oropharynx and nasopharynx clear.  NECK:  Supple, no jugular venous distention. No thyroid enlargement, no tenderness.  LUNGS: Normal breath sounds bilaterally, no wheezing, rales,rhonchi or crepitation. No use of accessory muscles of respiration.  CARDIOVASCULAR: S1, S2 normal. No murmurs, rubs, or gallops.  ABDOMEN: Soft, generalized tenderness throughout the abdomen, nondistended. Bowel sounds present. No organomegaly or mass. ER physician did rectal exam and it was guaiac-negative mucus EXTREMITIES: Trace pedal edema, no cyanosis, or clubbing.  NEUROLOGIC: Cranial nerves II through XII are intact. Muscle strength 5/5 in all extremities. Sensation intact. Gait not checked.  PSYCHIATRIC: The patient is alert and oriented x 3.  SKIN: No rash, lesion, or ulcer.   LABORATORY PANEL:   CBC  Recent Labs Lab 12/15/15 1433  WBC 7.0  HGB 14.0  HCT 41.5  PLT 185    ------------------------------------------------------------------------------------------------------------------  Chemistries   Recent Labs Lab 12/15/15 1433  NA 139  K 4.2  CL 107  CO2 24  GLUCOSE 102*  BUN 9  CREATININE 0.83  CALCIUM 9.7  AST 30  ALT 40  ALKPHOS 51  BILITOT 0.7   ------------------------------------------------------------------------------------------------------------------  Cardiac Enzymes  Recent Labs Lab 12/15/15 1433  TROPONINI <0.03   ------------------------------------------------------------------------------------------------------------------  RADIOLOGY:  Ct Abdomen Pelvis W Contrast  12/15/2015  CLINICAL DATA:  Worsening abdominal pain and distention for 1 week. EXAM: CT ABDOMEN AND PELVIS WITH CONTRAST TECHNIQUE: Multidetector CT imaging of the abdomen and pelvis was performed using the standard protocol following bolus administration of intravenous contrast. CONTRAST:  118mL OMNIPAQUE IOHEXOL 300 MG/ML  SOLN COMPARISON:  04/22/2015 FINDINGS: Lower chest:  No acute findings. Hepatobiliary: Increased moderate hepatic steatosis demonstrated, however no liver masses are identified. Gallbladder is unremarkable. Pancreas: No mass, inflammatory changes, or other significant abnormality. Spleen: Within normal limits in size and  appearance. Adrenals/Urinary Tract: No masses identified. No evidence of hydronephrosis. Stomach/Bowel: No evidence of obstruction, inflammatory process, or abnormal fluid collections. Vascular/Lymphatic: No pathologically enlarged lymph nodes. No evidence of abdominal aortic aneurysm. Reproductive: No mass or other significant abnormality. Other: No evidence of ascites. Musculoskeletal: No suspicious bone lesions identified. Lower lumbar spine hardware again seen in place from L4-S1. IMPRESSION: No acute findings identified within the abdomen or pelvis. Increased diffuse hepatic steatosis compared prior exam. Electronically  Signed   By: Earle Gell M.D.   On: 12/15/2015 16:32   Dg Abd Acute W/chest  12/15/2015  CLINICAL DATA:  Abdominal pain, distention, blood in stool. EXAM: DG ABDOMEN ACUTE W/ 1V CHEST COMPARISON:  CT 04/22/2015 FINDINGS: There is mild cardiomegaly.  Lungs are clear.  No effusions. Nonobstructive bowel gas pattern. No organomegaly, free air or suspicious calcification. Postoperative changes in the lower lumbar spine. IMPRESSION: No acute findings.  Mild cardiomegaly. Electronically Signed   By: Rolm Baptise M.D.   On: 12/15/2015 15:00    EKG:   Normal sinus rhythm 70 bpm  IMPRESSION AND PLAN:   1. Severe abdominal pain, diarrhea, rectal bleeding. Patient had a negative CT scan of the abdomen and pelvis. Patient had a colonoscopy back in the middle of 2015 that showed 1 polyp. Looking back at the GI notes history of collagenous colitis. Admit as observation get a GI consultation when necessary pain medications. Send off stool studies. Serial hemoglobins. Clear liquid diet for now. 2. Chronic pain syndrome continue Roxicodone 3. Anxiety depression- continue consider restarting Cymbalta as outpatient 4. COPD- continue his inhaler  All the records are reviewed and case discussed with ED provider. Management plans discussed with the patient, family and they are in agreement.  CODE STATUS: Full code  TOTAL TIME TAKING CARE OF THIS PATIENT: 50  minutes.    Loletha Grayer M.D on 12/15/2015 at 5:53 PM  Between 7am to 6pm - Pager - [phone]  After 6pm call admission pager [facility]  Office  [phone]  CC: Primary care physician; Golden Pop, MD

## 2015-12-15 NOTE — ED Notes (Signed)
Pt presents from home via ems with c.o abd pain. "my belly is swole"; this has progressed for 1 week.

## 2015-12-15 NOTE — Progress Notes (Signed)
Pt arrived on 2C from ED at shift change; RN present in room at arrival; pt a/o x4; VSS; no acute distress; report given to night shift RN

## 2015-12-16 DIAGNOSIS — Z8249 Family history of ischemic heart disease and other diseases of the circulatory system: Secondary | ICD-10-CM | POA: Diagnosis not present

## 2015-12-16 DIAGNOSIS — Z9049 Acquired absence of other specified parts of digestive tract: Secondary | ICD-10-CM | POA: Diagnosis not present

## 2015-12-16 DIAGNOSIS — Z87891 Personal history of nicotine dependence: Secondary | ICD-10-CM | POA: Diagnosis not present

## 2015-12-16 DIAGNOSIS — A047 Enterocolitis due to Clostridium difficile: Secondary | ICD-10-CM | POA: Diagnosis present

## 2015-12-16 DIAGNOSIS — Z9889 Other specified postprocedural states: Secondary | ICD-10-CM | POA: Diagnosis not present

## 2015-12-16 DIAGNOSIS — R1084 Generalized abdominal pain: Secondary | ICD-10-CM | POA: Insufficient documentation

## 2015-12-16 DIAGNOSIS — Z8261 Family history of arthritis: Secondary | ICD-10-CM | POA: Diagnosis not present

## 2015-12-16 DIAGNOSIS — K625 Hemorrhage of anus and rectum: Secondary | ICD-10-CM | POA: Insufficient documentation

## 2015-12-16 DIAGNOSIS — Z833 Family history of diabetes mellitus: Secondary | ICD-10-CM | POA: Diagnosis not present

## 2015-12-16 DIAGNOSIS — Z79899 Other long term (current) drug therapy: Secondary | ICD-10-CM | POA: Diagnosis not present

## 2015-12-16 DIAGNOSIS — Z888 Allergy status to other drugs, medicaments and biological substances status: Secondary | ICD-10-CM | POA: Diagnosis not present

## 2015-12-16 DIAGNOSIS — K921 Melena: Secondary | ICD-10-CM | POA: Diagnosis present

## 2015-12-16 DIAGNOSIS — Z841 Family history of disorders of kidney and ureter: Secondary | ICD-10-CM | POA: Diagnosis not present

## 2015-12-16 DIAGNOSIS — Z885 Allergy status to narcotic agent status: Secondary | ICD-10-CM | POA: Diagnosis not present

## 2015-12-16 DIAGNOSIS — Z8505 Personal history of malignant neoplasm of liver: Secondary | ICD-10-CM | POA: Diagnosis not present

## 2015-12-16 DIAGNOSIS — J449 Chronic obstructive pulmonary disease, unspecified: Secondary | ICD-10-CM | POA: Diagnosis present

## 2015-12-16 DIAGNOSIS — A0472 Enterocolitis due to Clostridium difficile, not specified as recurrent: Secondary | ICD-10-CM | POA: Diagnosis present

## 2015-12-16 DIAGNOSIS — M5136 Other intervertebral disc degeneration, lumbar region: Secondary | ICD-10-CM | POA: Diagnosis present

## 2015-12-16 DIAGNOSIS — G894 Chronic pain syndrome: Secondary | ICD-10-CM | POA: Diagnosis present

## 2015-12-16 DIAGNOSIS — G43909 Migraine, unspecified, not intractable, without status migrainosus: Secondary | ICD-10-CM | POA: Diagnosis present

## 2015-12-16 DIAGNOSIS — J45909 Unspecified asthma, uncomplicated: Secondary | ICD-10-CM | POA: Diagnosis present

## 2015-12-16 DIAGNOSIS — F418 Other specified anxiety disorders: Secondary | ICD-10-CM | POA: Diagnosis present

## 2015-12-16 DIAGNOSIS — Z7951 Long term (current) use of inhaled steroids: Secondary | ICD-10-CM | POA: Diagnosis not present

## 2015-12-16 DIAGNOSIS — I10 Essential (primary) hypertension: Secondary | ICD-10-CM | POA: Diagnosis present

## 2015-12-16 LAB — COMPREHENSIVE METABOLIC PANEL
ALK PHOS: 57 U/L (ref 38–126)
ALT: 39 U/L (ref 17–63)
ANION GAP: 6 (ref 5–15)
AST: 31 U/L (ref 15–41)
Albumin: 3.9 g/dL (ref 3.5–5.0)
BILIRUBIN TOTAL: 1.2 mg/dL (ref 0.3–1.2)
BUN: 13 mg/dL (ref 6–20)
CALCIUM: 9 mg/dL (ref 8.9–10.3)
CO2: 26 mmol/L (ref 22–32)
Chloride: 106 mmol/L (ref 101–111)
Creatinine, Ser: 0.84 mg/dL (ref 0.61–1.24)
GFR calc Af Amer: >60 mL/min (ref >60)
Glucose, Bld: 111 mg/dL — ABNORMAL HIGH (ref 65–99)
POTASSIUM: 4.4 mmol/L (ref 3.5–5.1)
Sodium: 138 mmol/L (ref 135–145)
TOTAL PROTEIN: 6.9 g/dL (ref 6.5–8.1)

## 2015-12-16 LAB — GASTROINTESTINAL PANEL BY PCR, STOOL (REPLACES STOOL CULTURE)
ADENOVIRUS F40/41: NOT DETECTED
ASTROVIRUS: NOT DETECTED
CAMPYLOBACTER SPECIES: NOT DETECTED
CRYPTOSPORIDIUM: NOT DETECTED
Cyclospora cayetanensis: NOT DETECTED
E. coli O157: NOT DETECTED
ENTEROPATHOGENIC E COLI (EPEC): NOT DETECTED
ENTEROTOXIGENIC E COLI (ETEC): NOT DETECTED
Entamoeba histolytica: NOT DETECTED
Enteroaggregative E coli (EAEC): NOT DETECTED
Giardia lamblia: NOT DETECTED
NOROVIRUS GI/GII: NOT DETECTED
PLESIMONAS SHIGELLOIDES: NOT DETECTED
ROTAVIRUS A: NOT DETECTED
SHIGA LIKE TOXIN PRODUCING E COLI (STEC): NOT DETECTED
Salmonella species: NOT DETECTED
Sapovirus (I, II, IV, and V): NOT DETECTED
Shigella/Enteroinvasive E coli (EIEC): NOT DETECTED
Vibrio cholerae: NOT DETECTED
Vibrio species: NOT DETECTED
Yersinia enterocolitica: NOT DETECTED

## 2015-12-16 LAB — CBC
HCT: 37.5 % — ABNORMAL LOW (ref 40.0–52.0)
HEMOGLOBIN: 12.9 g/dL — AB (ref 13.0–18.0)
MCH: 29.6 pg (ref 26.0–34.0)
MCHC: 34.5 g/dL (ref 32.0–36.0)
MCV: 85.6 fL (ref 80.0–100.0)
Platelets: 163 10*3/uL (ref 150–440)
RBC: 4.38 MIL/uL — ABNORMAL LOW (ref 4.40–5.90)
RDW: 13.8 % (ref 11.5–14.5)
WBC: 7.2 10*3/uL (ref 3.8–10.6)

## 2015-12-16 LAB — URINALYSIS COMPLETE WITH MICROSCOPIC (ARMC ONLY)
Bacteria, UA: NONE SEEN
Bilirubin Urine: NEGATIVE
GLUCOSE, UA: NEGATIVE mg/dL
HGB URINE DIPSTICK: NEGATIVE
Ketones, ur: NEGATIVE mg/dL
LEUKOCYTES UA: NEGATIVE
NITRITE: NEGATIVE
Protein, ur: NEGATIVE mg/dL
SPECIFIC GRAVITY, URINE: 1.034 — AB (ref 1.005–1.030)
Squamous Epithelial / LPF: NONE SEEN
WBC UA: NONE SEEN WBC/hpf (ref 0–5)
pH: 6 (ref 5.0–8.0)

## 2015-12-16 LAB — BASIC METABOLIC PANEL
Anion gap: 5 (ref 5–15)
BUN: 13 mg/dL (ref 6–20)
CALCIUM: 9 mg/dL (ref 8.9–10.3)
CO2: 26 mmol/L (ref 22–32)
CREATININE: 0.93 mg/dL (ref 0.61–1.24)
Chloride: 109 mmol/L (ref 101–111)
GFR calc Af Amer: >60 mL/min (ref >60)
GFR calc non Af Amer: >60 mL/min (ref >60)
GLUCOSE: 128 mg/dL — AB (ref 65–99)
Potassium: 4.1 mmol/L (ref 3.5–5.1)
SODIUM: 140 mmol/L (ref 135–145)

## 2015-12-16 LAB — C DIFFICILE QUICK SCREEN W PCR REFLEX
C DIFFICILE (CDIFF) TOXIN: NEGATIVE
C DIFFICLE (CDIFF) ANTIGEN: POSITIVE — AB

## 2015-12-16 LAB — OCCULT BLOOD X 1 CARD TO LAB, STOOL: Fecal Occult Bld: POSITIVE — AB

## 2015-12-16 LAB — CLOSTRIDIUM DIFFICILE BY PCR: CDIFFPCR: POSITIVE — AB

## 2015-12-16 MED ORDER — ONDANSETRON HCL 4 MG/2ML IJ SOLN
INTRAMUSCULAR | Status: AC
  Filled 2015-12-16: qty 2

## 2015-12-16 MED ORDER — SUMATRIPTAN SUCCINATE 50 MG PO TABS
100.0000 mg | ORAL_TABLET | ORAL | Status: DC | PRN
  Administered 2015-12-16: 100 mg via ORAL
  Filled 2015-12-16 (×2): qty 1

## 2015-12-16 MED ORDER — SODIUM CHLORIDE 0.9 % IV SOLN
INTRAVENOUS | Status: DC
  Administered 2015-12-16 – 2015-12-17 (×2): via INTRAVENOUS

## 2015-12-16 MED ORDER — ONDANSETRON HCL 4 MG/2ML IJ SOLN
4.0000 mg | Freq: Four times a day (QID) | INTRAMUSCULAR | Status: DC | PRN
Start: 2015-12-16 — End: 2015-12-17
  Administered 2015-12-16: 4 mg via INTRAVENOUS

## 2015-12-16 MED ORDER — VANCOMYCIN 50 MG/ML ORAL SOLUTION
125.0000 mg | Freq: Four times a day (QID) | ORAL | Status: DC
  Administered 2015-12-16 – 2015-12-17 (×4): 125 mg via ORAL
  Filled 2015-12-16 (×7): qty 2.5

## 2015-12-16 NOTE — Progress Notes (Signed)
Dr. Manuella Ghazi notified of positive C Dif. No new orders at this time. Dr. Manuella Ghazi to enter orders.  Almedia Balls, RN

## 2015-12-16 NOTE — Consult Note (Signed)
[facility]  [address] [place], [place] [address] Phone: [phone] Fax : [phone]  Consultation  Referring Provider:     No ref. provider found Primary Care Physician:  Golden Pop, MD Primary Gastroenterologist:  Dr. Vira Agar         Reason for Consultation:     Abdominal pain and diarrhea  Date of Admission:  12/15/2015 Date of Consultation:  12/16/2015         HPI:   Edward Schneider. is a 60 y.o. male who has been seen in the past by [facility] for diarrhea. The patient was found to have collagenous colitis. The patient now reports that he had some bright red blood per rectum with abdominal pain. The patient had a CT scan of the abdomen that did not show any acute processes. The patient also had a C. difficile sent off that showed a positive C. difficile without the toxigenic strain. 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 The patient has been on vancomycin and states that his abdominal pain is better today than it was when he came in. There is no report of any unexplained weight loss and he states that he goes up and down in his weight. The patient has not had any further rectal bleeding or diarrhea. The patient was being followed by Dr. Vira Agar but has been looking to change to another gastroenterologist.  Past Medical History  Diagnosis Date  . C. difficile diarrhea   . Asthma   . Cancer [facility]) June 2016    liver cancer  . Allergy   . Hypertension   . Chronic pain   . DDD (degenerative disc disease), lumbar   . DDD (degenerative disc disease), cervical   . Low blood sugar     Past Surgical History  Procedure Laterality Date  . Neck surgery    . Back surgery    . Appendectomy    . Spleen surg    . Spleen surgery    . Knee surgery Right     Prior to Admission medications   Medication Sig Start Date End Date Taking? Authorizing Provider  diphenhydrAMINE (BENADRYL) 25 mg capsule Take 25 mg  by mouth at bedtime.   Yes Historical Provider, MD  EPINEPHrine (EPIPEN 2-PAK) 0.3 mg/0.3 mL IJ SOAJ injection Inject 0.3 mLs (0.3 mg total) into the muscle once. 12/12/15  Yes Guadalupe Maple, MD  Korean Ginseng 520 MG CAPS Take 520 mg by mouth every morning.    Yes Historical Provider, MD  Multiple Vitamins-Minerals (CENTRUM SILVER PO) Take 1 tablet by mouth every morning.   Yes Historical Provider, MD  omeprazole (PRILOSEC OTC) 20 MG tablet Take 20 mg by mouth 2 (two) times daily.   Yes Historical Provider, MD  oxyCODONE (ROXICODONE) 5 MG immediate release tablet Limit one tablet by mouth 3-6  times per day if tolerated 11/28/15  Yes Mohammed Kindle, MD  SUMAtriptan (IMITREX) 100 MG tablet Take 1 tablet (100 mg total) by mouth once. May repeat in 2 hours if headache persists or recurs. 09/23/15  Yes Guadalupe Maple, MD  umeclidinium-vilanterol (ANORO ELLIPTA) 62.5-25 MCG/INH AEPB Inhale 1 puff into the lungs daily. 12/12/15  Yes Guadalupe Maple, MD    Family History  Problem Relation Age of Onset  . Arthritis Mother   . Diabetes Mother   . Kidney disease Mother   . Heart disease Mother   . Hypertension Mother   .  Arthritis Father   . Hearing loss Father   . Hypertension Father   . Diabetes Sister   . Heart disease Sister      Social History  Substance Use Topics  . Smoking status: Former Smoker -- 50 years    Types: Cigarettes    Quit date: 07/05/2015  . Smokeless tobacco: Never Used  . Alcohol Use: No    Allergies as of 12/15/2015 - Review Complete 12/15/2015  Allergen Reaction Noted  . Bee pollen Anaphylaxis 04/25/2015  . Shrimp [shellfish allergy] Anaphylaxis 04/16/2015  . Fentanyl Itching 03/27/2015  . Gabapentin Diarrhea 04/25/2015  . Morphine Itching 10/10/2015  . Morphine and related Itching 03/27/2015  . Simvastatin Diarrhea 04/25/2015  . Buprenorphine hcl Itching 10/10/2015    Review of Systems:    All systems reviewed and negative except where noted in HPI.    Physical Exam:  Vital signs in last 24 hours: Temp:  [98.1 F (36.7 C)-98.2 F (36.8 C)] 98.1 F (36.7 C) (02/27 1200) Pulse Rate:  [62-67] 67 (02/27 1200) Resp:  [16-20] 19 (02/27 1200) BP: (122-149)/(66-86) 125/78 mmHg (02/27 1200) SpO2:  [93 %-98 %] 98 % (02/27 1200) Last BM Date: 12/16/15 General:   Pleasant, cooperative in NAD Head:  Normocephalic and atraumatic. Eyes:   No icterus.   Conjunctiva pink. PERRLA. Ears:  Normal auditory acuity. Neck:  Supple; no masses or thyroidomegaly Lungs: Respirations even and unlabored. Lungs clear to auscultation bilaterally.   No wheezes, crackles, or rhonchi.  Heart:  Regular rate and rhythm;  Without murmur, clicks, rubs or gallops Abdomen:  Soft, nondistended, diffuse tenderness. Normal bowel sounds. No appreciable masses or hepatomegaly.  No rebound or guarding.  Rectal:  Not performed. Msk:  Symmetrical without gross deformities.   Extremities:  Without edema, cyanosis or clubbing. Neurologic:  Alert and oriented x3;  grossly normal neurologically. Skin:  Intact without significant lesions or rashes. Cervical Nodes:  No significant cervical adenopathy. Psych:  Alert and cooperative. Normal affect.  LAB RESULTS:  Recent Labs  12/15/15 1433 12/16/15 0504  WBC 7.0 7.2  HGB 14.0 12.9*  HCT 41.5 37.5*  PLT 185 163   BMET  Recent Labs  12/15/15 1433 12/16/15 0504 12/16/15 1310  NA 139 140 138  K 4.2 4.1 4.4  CL 107 109 106  CO2 24 26 26   GLUCOSE 102* 128* 111*  BUN 9 13 13   CREATININE 0.83 0.93 0.84  CALCIUM 9.7 9.0 9.0   LFT  Recent Labs  12/16/15 1310  PROT 6.9  ALBUMIN 3.9  AST 31  ALT 39  ALKPHOS 57  BILITOT 1.2   PT/INR  Recent Labs  12/15/15 1433  LABPROT 13.4  INR 1.00    STUDIES: Ct Abdomen Pelvis W Contrast  12/15/2015  CLINICAL DATA:  Worsening abdominal pain and distention for 1 week. EXAM: CT ABDOMEN AND PELVIS WITH CONTRAST TECHNIQUE: Multidetector CT imaging of the abdomen and pelvis  was performed using the standard protocol following bolus administration of intravenous contrast. CONTRAST:  158mL OMNIPAQUE IOHEXOL 300 MG/ML  SOLN COMPARISON:  04/22/2015 FINDINGS: Lower chest:  No acute findings. Hepatobiliary: Increased moderate hepatic steatosis demonstrated, however no liver masses are identified. Gallbladder is unremarkable. Pancreas: No mass, inflammatory changes, or other significant abnormality. Spleen: Within normal limits in size and appearance. Adrenals/Urinary Tract: No masses identified. No evidence of hydronephrosis. Stomach/Bowel: No evidence of obstruction, inflammatory process, or abnormal fluid collections. Vascular/Lymphatic: No pathologically enlarged lymph nodes. No evidence of abdominal aortic aneurysm. Reproductive: No  mass or other significant abnormality. Other: No evidence of ascites. Musculoskeletal: No suspicious bone lesions identified. Lower lumbar spine hardware again seen in place from L4-S1. IMPRESSION: No acute findings identified within the abdomen or pelvis. Increased diffuse hepatic steatosis compared prior exam. Electronically Signed   By: Earle Gell M.D.   On: 12/15/2015 16:32   Dg Abd Acute W/chest  12/15/2015  CLINICAL DATA:  Abdominal pain, distention, blood in stool. EXAM: DG ABDOMEN ACUTE W/ 1V CHEST COMPARISON:  CT 04/22/2015 FINDINGS: There is mild cardiomegaly.  Lungs are clear.  No effusions. Nonobstructive bowel gas pattern. No organomegaly, free air or suspicious calcification. Postoperative changes in the lower lumbar spine. IMPRESSION: No acute findings.  Mild cardiomegaly. Electronically Signed   By: Rolm Baptise M.D.   On: 12/15/2015 15:00      Impression / Plan:   Edward Monjaraz. is a 60 y.o. y/o male with who comes in today with a history of abdominal pain and rectal bleeding with chronic diarrhea. The patient now was found to have heme positive stools. The differential diagnosis is that the patient may have had an ischemic  colitis attack versus an attack of collagenous colitis. The collagenous colitis should not cause the patient have any abdominal pain. I'm not sure what to make of this patient positive non-toxigenic C. difficile. I will advance the patient's diet. He will be followed while in the hospital and has been told that I will follow him as an outpatient if he would like. The patient has been explained the plan and agrees with it.  Thank you for involving me in the care of this patient.      LOS: 0 days   Ollen Bowl, MD  12/16/2015, 5:12 PM   Note: This dictation was prepared with Dragon dictation along with smaller phrase technology. Any transcriptional errors that result from this process are unintentional.

## 2015-12-16 NOTE — Care Management Obs Status (Signed)
[facility] NOTIFICATION   Patient Details  Name: Edward Schneider. MRN: [id number] Date of Birth: 1956/04/03   Medicare Observation Status Notification Given:  Yes    Beau Fanny, RN 12/16/2015, 10:22 AM

## 2015-12-16 NOTE — Progress Notes (Signed)
[facility] [place] [facility]   PATIENT NAME: Edward Schneider    MR#:  [id number]  DATE OF BIRTH:  Nov 19, 1955  SUBJECTIVE:  CHIEF COMPLAINT:   Chief Complaint  Patient presents with  . Abdominal Pain   still having 7 out of 10 pain in abdomen  REVIEW OF SYSTEMS:  Review of Systems  Constitutional: Negative for fever, weight loss, malaise/fatigue and diaphoresis.  HENT: Negative for ear discharge, ear pain, hearing loss, nosebleeds, sore throat and tinnitus.   Eyes: Negative for blurred vision and pain.  Respiratory: Negative for cough, hemoptysis, shortness of breath and wheezing.   Cardiovascular: Negative for chest pain, palpitations, orthopnea and leg swelling.  Gastrointestinal: Positive for vomiting and abdominal pain. Negative for heartburn, nausea, diarrhea, constipation and blood in stool.  Genitourinary: Negative for dysuria, urgency and frequency.  Musculoskeletal: Negative for myalgias and back pain.  Skin: Negative for itching and rash.  Neurological: Negative for dizziness, tingling, tremors, focal weakness, seizures, weakness and headaches.  Psychiatric/Behavioral: Negative for depression. The patient is not nervous/anxious.     DRUG ALLERGIES:   Allergies  Allergen Reactions  . Bee Pollen Anaphylaxis    Died 3 times when stung by bees. Carries Epi-pen at all times.  . Shrimp [Shellfish Allergy] Anaphylaxis  . Fentanyl Itching  . Gabapentin Diarrhea    Severe diarrhea which caused incontinence, loss of appetite and weight loss.   . Morphine Itching  . Morphine And Related Itching  . Simvastatin Diarrhea  . Buprenorphine Hcl Itching   VITALS:  Blood pressure 122/75, pulse 65, temperature 98.2 F (36.8 C), temperature source Oral, resp. rate 20, height 5\' 8"  (1.727 m), weight 97.07 kg (214 lb), SpO2 97 %. PHYSICAL EXAMINATION:  Physical Exam  Constitutional: He is oriented to person, place, and time and well-developed,  well-nourished, and in no distress.  HENT:  Head: Normocephalic and atraumatic.  Eyes: Conjunctivae and EOM are normal. Pupils are equal, round, and reactive to light.  Neck: Normal range of motion. Neck supple. No tracheal deviation present. No thyromegaly present.  Cardiovascular: Normal rate, regular rhythm and normal heart sounds.   Pulmonary/Chest: Effort normal and breath sounds normal. No respiratory distress. He has no wheezes. He exhibits no tenderness.  Abdominal: Soft. Bowel sounds are normal. He exhibits no distension. There is tenderness in the epigastric area and periumbilical area.  Musculoskeletal: Normal range of motion.  Neurological: He is alert and oriented to person, place, and time. No cranial nerve deficit.  Skin: Skin is warm and dry. No rash noted.  Psychiatric: Affect normal. His mood appears anxious. He exhibits a depressed mood.   LABORATORY PANEL:   CBC  Recent Labs Lab 12/16/15 0504  WBC 7.2  HGB 12.9*  HCT 37.5*  PLT 163   ------------------------------------------------------------------------------------------------------------------ Chemistries   Recent Labs Lab 12/15/15 1433 12/16/15 0504  NA 139 140  K 4.2 4.1  CL 107 109  CO2 24 26  GLUCOSE 102* 128*  BUN 9 13  CREATININE 0.83 0.93  CALCIUM 9.7 9.0  AST 30  --   ALT 40  --   ALKPHOS 51  --   BILITOT 0.7  --    RADIOLOGY:  Ct Abdomen Pelvis W Contrast  12/15/2015  CLINICAL DATA:  Worsening abdominal pain and distention for 1 week. EXAM: CT ABDOMEN AND PELVIS WITH CONTRAST TECHNIQUE: Multidetector CT imaging of the abdomen and pelvis was performed using the standard protocol following bolus administration of intravenous contrast. CONTRAST:  OMNIPAQUE IOHEXOL 300 MG/ML  SOLN COMPARISON:  04/22/2015 FINDINGS: Lower chest:  No acute findings. Hepatobiliary: Increased moderate hepatic steatosis demonstrated, however no liver masses are identified. Gallbladder is unremarkable.  Pancreas: No mass, inflammatory changes, or other significant abnormality. Spleen: Within normal limits in size and appearance. Adrenals/Urinary Tract: No masses identified. No evidence of hydronephrosis. Stomach/Bowel: No evidence of obstruction, inflammatory process, or abnormal fluid collections. Vascular/Lymphatic: No pathologically enlarged lymph nodes. No evidence of abdominal aortic aneurysm. Reproductive: No mass or other significant abnormality. Other: No evidence of ascites. Musculoskeletal: No suspicious bone lesions identified. Lower lumbar spine hardware again seen in place from L4-S1. IMPRESSION: No acute findings identified within the abdomen or pelvis. Increased diffuse hepatic steatosis compared prior exam. Electronically Signed   By: Myles Rosenthal M.D.   On: 12/15/2015 16:32   Dg Abd Acute W/chest  12/15/2015  CLINICAL DATA:  Abdominal pain, distention, blood in stool. EXAM: DG ABDOMEN ACUTE W/ 1V CHEST COMPARISON:  CT 04/22/2015 FINDINGS: There is mild cardiomegaly.  Lungs are clear.  No effusions. Nonobstructive bowel gas pattern. No organomegaly, free air or suspicious calcification. Postoperative changes in the lower lumbar spine. IMPRESSION: No acute findings.  Mild cardiomegaly. Electronically Signed   By: Charlett Nose M.D.   On: 12/15/2015 15:00   ASSESSMENT AND PLAN:  1. Severe abdominal pain, diarrhea, rectal bleeding. Patient had a negative CT scan of the abdomen and pelvis. Patient had a colonoscopy back in the middle of 2015 that showed 1 polyp. Looking back at the GI notes history of collagenous colitis.  Looking at Dr. Christell Faith last office note patient was not happy with, [facility] and was looking for alternative GI consultation as an outpt. continue when necessary pain medications. await stool studies. Serial hemoglobins. Clear liquid diet for now. Await GI c/s while here,  2. Chronic pain syndrome: continue Roxicodone (follows with Dr. crisp - pain management)  3. Anxiety  depression- continue consider restarting Cymbalta as outpatient  4. COPD- continue his inhaler, stable.  No exacerbation  5.  Migraine: Having some vomiting could be related to same.  Will order Imitrex as that's what He has been using at Home.     All the records are reviewed and case discussed with Care Management/Social Worker. Management plans discussed with the patient, family and they are in agreement.  CODE STATUS: FULL CODE  TOTAL TIME TAKING CARE OF THIS PATIENT: 35 minutes.   More than 50% of the time was spent in counseling/coordination of care: YES   POSSIBLE D/C IN 1-2 DAYS, DEPENDING ON CLINICAL CONDITION.  Await GI input   [facility], Thedore Pickel M.D on 12/16/2015 at 11:12 AM  Between 7am to 6pm - Pager - [phone]  After 6pm go to [email or web address] - password [email or web address]  [facility]  Office  [phone]  CC: Primary care physician; Vonita Moss, MD  Note: This dictation was prepared with Dragon dictation along with smaller phrase technology. Any transcriptional errors that result from this process are unintentional.

## 2015-12-17 LAB — CBC
HEMATOCRIT: 37.4 % — AB (ref 40.0–52.0)
Hemoglobin: 12.7 g/dL — ABNORMAL LOW (ref 13.0–18.0)
MCH: 29.6 pg (ref 26.0–34.0)
MCHC: 34 g/dL (ref 32.0–36.0)
MCV: 86.9 fL (ref 80.0–100.0)
PLATELETS: 160 10*3/uL (ref 150–440)
RBC: 4.3 MIL/uL — AB (ref 4.40–5.90)
RDW: 14.3 % (ref 11.5–14.5)
WBC: 7.3 10*3/uL (ref 3.8–10.6)

## 2015-12-17 MED ORDER — IPRATROPIUM BROMIDE 0.02 % IN SOLN
0.5000 mg | Freq: Four times a day (QID) | RESPIRATORY_TRACT | Status: DC
  Filled 2015-12-17 (×2): qty 2.5

## 2015-12-17 MED ORDER — ARFORMOTEROL TARTRATE 15 MCG/2ML IN NEBU
15.0000 ug | INHALATION_SOLUTION | Freq: Two times a day (BID) | RESPIRATORY_TRACT | Status: DC
  Filled 2015-12-17 (×3): qty 2

## 2015-12-17 MED ORDER — VANCOMYCIN 50 MG/ML ORAL SOLUTION: 125.0000 mg | Freq: Four times a day (QID) | ORAL | Status: DC

## 2015-12-17 NOTE — Discharge Instructions (Signed)
Clostridium Difficile Infection Clostridium difficile (C. difficile or C. diff) is a germ found in the intestines. C. difficile infection can happen after taking antibiotic medicines. Antiobiotics may cause the C. difficile to grow out of control and make a toxin that causes the infection. C. difficile infection can cause watery poop (diarrhea) or severe disease. HOME CARE  Drink enough fluids to keep your pee (urine) clear or pale yellow. Avoid milk, caffeine, and alcohol.  Ask your doctor how to replace the body fluids you lost (rehydrate).  Eat small meals often. Avoid eating large meals.  Take your antibiotics as told. Finish them even if you start to feel better.  Do not  take medicines that help watery poop. These medicines may stop you from healing as fast or cause problems.  Wash your hands after using the bathroom and before touching food. Make sure people who live with you wash their hands often.  Clean all surfaces in your home. Use a product that has chlorine bleach in it. GET HELP IF:  Your watery poop lasts longer than expected.  Your watery poop comes back after you finish your antibiotic medicine.  You feel very dry or thirsty (dehydrated).  You have a fever. GET HELP RIGHT AWAY IF:   You have more stomach pain or tenderness.  You have blood in your poop (stool). Your poop may look black and tar-like.  You cannot eat or drink without throwing up (vomiting).   This information is not intended to replace advice given to you by your health care provider. Make sure you discuss any questions you have with your health care provider.   Document Released: 08/02/2009 Document Revised: 10/26/2014 Document Reviewed: 04/08/2015 Elsevier Interactive Patient Education 2016 [employer]  

## 2015-12-17 NOTE — Progress Notes (Signed)
Patient alert and oriented times four. Ambulates without assistance. No acute distress noted. Patient discharge to home as ordered. Discharge instructions given as ordered.

## 2015-12-19 NOTE — Discharge Summary (Signed)
[facility] [place] at [facility]   PATIENT NAME: Edward Schneider    MR#:  [id number]  DATE OF BIRTH:  [birth year]  DATE OF ADMISSION:  12/15/2015 ADMITTING PHYSICIAN: Alford Highland, MD  DATE OF DISCHARGE: 12/17/2015  4:00 PM  PRIMARY CARE PHYSICIAN: Vonita Moss, MD    ADMISSION DIAGNOSIS:  Generalized abdominal pain [R10.84] BRBPR (bright red blood per rectum) [K62.5] Intractable abdominal pain [R10.9]  DISCHARGE DIAGNOSIS:  Active Problems:   Abdominal pain   C. difficile diarrhea   BRBPR (bright red blood per rectum)   Generalized abdominal pain   SECONDARY DIAGNOSIS:   Past Medical History  Diagnosis Date  . C. difficile diarrhea   . Asthma   . Cancer [facility]) June 2016    liver cancer  . Allergy   . Hypertension   . Chronic pain   . DDD (degenerative disc disease), lumbar   . DDD (degenerative disc disease), cervical   . Low blood sugar     HOSPITAL COURSE:  60 y.o. male with a known history of collagenous colitis admitted for severe abdominal pain. He had 3 episodes of bright red blood per rectum after hearing something pop in his abdomen. In the ER, CT scan of the abdomen and pelvis was negative, rectal exam by ER physician was negative and hemoglobin was stable. Patient's also been having diarrhea going on now for 3 years. Please see Dr Mathews Robinsons dictated [facility] for further details.  Stool studies returned + for c.diff. GI c/s was obtained with Dr Servando Snare who recommended treatment for same. His diarrhea were slowed down with treatment of c.diff. His abd pain also improved and was D/C home in stable condition as he was feeling much better. He was agreeable with D/C plans. DISCHARGE CONDITIONS:   STABLE  CONSULTS OBTAINED:  Treatment Team:  Midge Minium, MD  DRUG ALLERGIES:   Allergies  Allergen Reactions  . Bee Pollen Anaphylaxis    Died 3 times when stung by bees. Carries Epi-pen at all times.  . Shrimp [Shellfish Allergy]  Anaphylaxis  . Fentanyl Itching  . Gabapentin Diarrhea    Severe diarrhea which caused incontinence, loss of appetite and weight loss.   . Morphine Itching  . Morphine And Related Itching  . Simvastatin Diarrhea  . Buprenorphine Hcl Itching    DISCHARGE MEDICATIONS:   Discharge Medication List as of 12/17/2015  3:11 PM    START taking these medications   Details  vancomycin (VANCOCIN) 50 mg/mL oral solution Take 2.5 mLs (125 mg total) by mouth every 6 (six) hours., Starting 12/17/2015, Until Discontinued, Print      CONTINUE these medications which have NOT CHANGED   Details  diphenhydrAMINE (BENADRYL) 25 mg capsule Take 25 mg by mouth at bedtime., Until Discontinued, Historical Med    EPINEPHrine (EPIPEN 2-PAK) 0.3 mg/0.3 mL IJ SOAJ injection Inject 0.3 mLs (0.3 mg total) into the muscle once., Starting 12/12/2015, Normal    [place] Ginseng 520 MG CAPS Take 520 mg by mouth every morning. , Until Discontinued, Historical Med    Multiple Vitamins-Minerals (CENTRUM SILVER PO) Take 1 tablet by mouth every morning., Until Discontinued, Historical Med    omeprazole (PRILOSEC OTC) 20 MG tablet Take 20 mg by mouth 2 (two) times daily., Until Discontinued, Historical Med    oxyCODONE (ROXICODONE) 5 MG immediate release tablet Limit one tablet by mouth 3-6  times per day if tolerated, Print    SUMAtriptan (IMITREX) 100 MG tablet Take 1 tablet (  100 mg total) by mouth once. May repeat in 2 hours if headache persists or recurs., Starting 09/23/2015, Normal    umeclidinium-vilanterol (ANORO ELLIPTA) 62.5-25 MCG/INH AEPB Inhale 1 puff into the lungs daily., Starting 12/12/2015, Until Discontinued, Normal         DISCHARGE INSTRUCTIONS:    DIET:  Regular diet  DISCHARGE CONDITION:  Good  ACTIVITY:  Activity as tolerated  OXYGEN:  Home Oxygen: No.   Oxygen Delivery: room air  DISCHARGE LOCATION:  home   If you experience worsening of your admission symptoms, develop shortness  of breath, life threatening emergency, suicidal or homicidal thoughts you must seek medical attention immediately by calling 911 or calling your MD immediately  if symptoms less severe.  You Must read complete instructions/literature along with all the possible adverse reactions/side effects for all the Medicines you take and that have been prescribed to you. Take any new Medicines after you have completely understood and accpet all the possible adverse reactions/side effects.   Please note  You were cared for by a hospitalist during your hospital stay. If you have any questions about your discharge medications or the care you received while you were in the hospital after you are discharged, you can call the unit and asked to speak with the hospitalist on call if the hospitalist that took care of you is not available. Once you are discharged, your primary care physician will handle any further medical issues. Please note that NO REFILLS for any discharge medications will be authorized once you are discharged, as it is imperative that you return to your primary care physician (or establish a relationship with a primary care physician if you do not have one) for your aftercare needs so that they can reassess your need for medications and monitor your lab values.    On the day of Discharge:  VITAL SIGNS:  Blood pressure 142/76, pulse 64, temperature 97.3 F (36.3 C), temperature source Oral, resp. rate 16, height 5\' 8"  (1.727 m), weight 97.07 kg (214 lb), SpO2 97 %.  PHYSICAL EXAMINATION:  GENERAL:  60 y.o.-year-old patient lying in the bed with no acute distress.  EYES: Pupils equal, round, reactive to light and accommodation. No scleral icterus. Extraocular muscles intact.  HEENT: Head atraumatic, normocephalic. Oropharynx and nasopharynx clear.  NECK:  Supple, no jugular venous distention. No thyroid enlargement, no tenderness.  LUNGS: Normal breath sounds bilaterally, no wheezing, rales,rhonchi  or crepitation. No use of accessory muscles of respiration.  CARDIOVASCULAR: S1, S2 normal. No murmurs, rubs, or gallops.  ABDOMEN: Soft, non-tender, non-distended. Bowel sounds present. No organomegaly or mass.  EXTREMITIES: No pedal edema, cyanosis, or clubbing.  NEUROLOGIC: Cranial nerves II through XII are intact. Muscle strength 5/5 in all extremities. Sensation intact. Gait not checked.  PSYCHIATRIC: The patient is alert and oriented x 3.  SKIN: No obvious rash, lesion, or ulcer.  DATA REVIEW:   CBC  Recent Labs Lab 12/17/15 0350  WBC 7.3  HGB 12.7*  HCT 37.4*  PLT 160    Chemistries   Recent Labs Lab 12/16/15 1310  NA 138  K 4.4  CL 106  CO2 26  GLUCOSE 111*  BUN 13  CREATININE 0.84  CALCIUM 9.0  AST 31  ALT 39  ALKPHOS 57  BILITOT 1.2    Cardiac Enzymes  Recent Labs Lab 12/15/15 1433  TROPONINI <0.03    Microbiology Results  Results for orders placed or performed during the hospital encounter of 12/15/15  Gastrointestinal Panel by  PCR , Stool     Status: None   Collection Time: 12/16/15 11:30 AM  Result Value Ref Range Status   Campylobacter species NOT DETECTED NOT DETECTED Final   Plesimonas shigelloides NOT DETECTED NOT DETECTED Final   Salmonella species NOT DETECTED NOT DETECTED Final   Yersinia enterocolitica NOT DETECTED NOT DETECTED Final   Vibrio species NOT DETECTED NOT DETECTED Final   Vibrio cholerae NOT DETECTED NOT DETECTED Final   Enteroaggregative E coli (EAEC) NOT DETECTED NOT DETECTED Final   Enteropathogenic E coli (EPEC) NOT DETECTED NOT DETECTED Final   Enterotoxigenic E coli (ETEC) NOT DETECTED NOT DETECTED Final   Shiga like toxin producing E coli (STEC) NOT DETECTED NOT DETECTED Final   E. coli O157 NOT DETECTED NOT DETECTED Final   Shigella/Enteroinvasive E coli (EIEC) NOT DETECTED NOT DETECTED Final   Cryptosporidium NOT DETECTED NOT DETECTED Final   Cyclospora cayetanensis NOT DETECTED NOT DETECTED Final   Entamoeba  histolytica NOT DETECTED NOT DETECTED Final   Giardia lamblia NOT DETECTED NOT DETECTED Final   Adenovirus F40/41 NOT DETECTED NOT DETECTED Final   Astrovirus NOT DETECTED NOT DETECTED Final   Norovirus GI/GII NOT DETECTED NOT DETECTED Final   Rotavirus A NOT DETECTED NOT DETECTED Final   Sapovirus (I, II, IV, and V) NOT DETECTED NOT DETECTED Final  C difficile quick scan w PCR reflex     Status: Abnormal   Collection Time: 12/16/15 11:30 AM  Result Value Ref Range Status   C Diff antigen POSITIVE (A) NEGATIVE Final   C Diff toxin NEGATIVE NEGATIVE Final   C Diff interpretation   Final    Positive for toxigenic C. difficile, active toxin production not detected. Patient has toxigenic C. difficile organisms present in the bowel, but toxin was not detected. The patient may be a carrier or the level of toxin in the sample was below the limit  of detection. This information should be used in conjunction with the patient's clinical history when deciding on possible therapy.   Clostridium Difficile by PCR     Status: Abnormal   Collection Time: 12/16/15 11:30 AM  Result Value Ref Range Status   Toxigenic C Difficile by pcr POSITIVE (A) NEGATIVE Final    Comment: CRITICAL RESULT CALLED TO, READ BACK BY AND VERIFIED WITH: EMILY HERRON 12/16/15 AT 1405 VKB     RADIOLOGY:  No results found.   Management plans discussed with the patient, family and they are in agreement.  CODE STATUS:  Code Status History    Date Active Date Inactive Code Status Order ID Comments User Context   12/15/2015  5:40 PM 12/17/2015  7:01 PM Full Code [id number]  Alford Highland, MD ED      TOTAL TIME TAKING CARE OF THIS PATIENT: 45 minutes.    [facility], Daily Crate M.D on 12/19/2015 at 12:36 PM  Between 7am to 6pm - Pager - [phone]  After 6pm go to [email or web address] - password [email or web address]  [facility]  Office  [phone]  CC: Primary care physician; Vonita Moss, MD   Note: This dictation was  prepared with Dragon dictation along with smaller phrase technology. Any transcriptional errors that result from this process are unintentional.

## 2015-12-24 ENCOUNTER — Ambulatory Visit (INDEPENDENT_AMBULATORY_CARE_PROVIDER_SITE_OTHER): Payer: Medicare Other | Admitting: Family Medicine

## 2015-12-24 ENCOUNTER — Encounter: Payer: Self-pay | Admitting: Family Medicine

## 2015-12-24 VITALS — BP 125/84 | HR 69 | Temp 97.9°F | Ht 68.7 in | Wt 222.0 lb

## 2015-12-24 DIAGNOSIS — G4700 Insomnia, unspecified: Secondary | ICD-10-CM | POA: Diagnosis not present

## 2015-12-24 DIAGNOSIS — J449 Chronic obstructive pulmonary disease, unspecified: Secondary | ICD-10-CM

## 2015-12-24 DIAGNOSIS — A047 Enterocolitis due to Clostridium difficile: Secondary | ICD-10-CM | POA: Diagnosis not present

## 2015-12-24 DIAGNOSIS — A0472 Enterocolitis due to Clostridium difficile, not specified as recurrent: Secondary | ICD-10-CM

## 2015-12-24 MED ORDER — TRAZODONE HCL 50 MG PO TABS: 25.0000 mg | ORAL_TABLET | Freq: Every evening | ORAL | Status: DC | PRN

## 2015-12-24 NOTE — Assessment & Plan Note (Signed)
Continue medications and keep appointment with GI

## 2015-12-24 NOTE — Assessment & Plan Note (Signed)
The current medical regimen is effective;  continue present plan and medications.  

## 2015-12-24 NOTE — Assessment & Plan Note (Signed)
Discuss insomnia care and treatment will use trazodone low-dose

## 2015-12-24 NOTE — Progress Notes (Signed)
   BP 125/84 mmHg  Pulse 69  Temp(Src) 97.9 F (36.6 C)  Ht 5' 8.7" (1.745 m)  Wt 222 lb (100.699 kg)  BMI 33.07 kg/m2  SpO2 95%   Subjective:    Patient ID: Edward Flesher., male    DOB: Nov 29, 1955, 60 y.o.   MRN: [id number]  HPI: Edward Brunick. is a 60 y.o. male  Chief Complaint  Patient presents with  . Hospitalization Follow-up   Patient follow-up hospitalization for C. difficile and hemorrhagic colitis patient doing better taking vancomycin orally and bouncing back. Patient has appointment pending with gastroenterologist to further evaluate for his colitis.  Pains being managed by Dr. crisp in that stable Breathing is stable using Anoro He'll taking Prilosec but stooling okay. Relevant past medical, surgical, family and social history reviewed and updated as indicated. Interim medical history since our last visit reviewed. Allergies and medications reviewed and updated.  Review of Systems  Constitutional: Negative.   Respiratory: Negative.   Cardiovascular: Negative.     been having insomnia problems getting worse over the last 6 months wants something to help take the edge off Per HPI unless specifically indicated above     Objective:    BP 125/84 mmHg  Pulse 69  Temp(Src) 97.9 F (36.6 C)  Ht 5' 8.7" (1.745 m)  Wt 222 lb (100.699 kg)  BMI 33.07 kg/m2  SpO2 95%  Wt Readings from Last 3 Encounters:  12/24/15 222 lb (100.699 kg)  12/15/15 214 lb (97.07 kg)  12/12/15 221 lb (100.245 kg)    Physical Exam  Constitutional: He is oriented to person, place, and time. He appears well-developed and well-nourished. No distress.  HENT:  Head: Normocephalic and atraumatic.  Right Ear: Hearing normal.  Left Ear: Hearing normal.  Nose: Nose normal.  Eyes: Conjunctivae and lids are normal. Right eye exhibits no discharge. Left eye exhibits no discharge. No scleral icterus.  Cardiovascular: Normal rate, regular rhythm and normal heart sounds.   Pulmonary/Chest:  Effort normal and breath sounds normal. No respiratory distress.  Musculoskeletal: Normal range of motion.  Neurological: He is alert and oriented to person, place, and time.  Skin: Skin is intact. No rash noted.  Psychiatric: He has a normal mood and affect. His speech is normal and behavior is normal. Judgment and thought content normal. Cognition and memory are normal.        Assessment & Plan:   Problem List Items Addressed This Visit      Respiratory   COPD (chronic obstructive pulmonary disease) ([place]) - Primary    The current medical regimen is effective;  continue present plan and medications.         Digestive   C. difficile diarrhea    Continue medications and keep appointment with GI        Other   Insomnia    Discuss insomnia care and treatment will use trazodone low-dose          Follow up plan: Return in about 3 months (around 03/25/2016) for Physical Exam.

## 2015-12-26 ENCOUNTER — Ambulatory Visit: Payer: Medicare Other | Attending: Pain Medicine | Admitting: Pain Medicine

## 2015-12-26 ENCOUNTER — Encounter: Payer: Self-pay | Admitting: Pain Medicine

## 2015-12-26 VITALS — BP 142/85 | HR 67 | Temp 97.6°F | Resp 15 | Ht 68.0 in | Wt 221.0 lb

## 2015-12-26 DIAGNOSIS — G90523 Complex regional pain syndrome I of lower limb, bilateral: Secondary | ICD-10-CM | POA: Diagnosis not present

## 2015-12-26 DIAGNOSIS — M5126 Other intervertebral disc displacement, lumbar region: Secondary | ICD-10-CM | POA: Diagnosis not present

## 2015-12-26 DIAGNOSIS — M5116 Intervertebral disc disorders with radiculopathy, lumbar region: Secondary | ICD-10-CM | POA: Diagnosis not present

## 2015-12-26 DIAGNOSIS — M47812 Spondylosis without myelopathy or radiculopathy, cervical region: Secondary | ICD-10-CM

## 2015-12-26 DIAGNOSIS — Z981 Arthrodesis status: Secondary | ICD-10-CM

## 2015-12-26 DIAGNOSIS — M1288 Other specific arthropathies, not elsewhere classified, other specified site: Secondary | ICD-10-CM | POA: Insufficient documentation

## 2015-12-26 DIAGNOSIS — M792 Neuralgia and neuritis, unspecified: Secondary | ICD-10-CM

## 2015-12-26 DIAGNOSIS — M5416 Radiculopathy, lumbar region: Secondary | ICD-10-CM

## 2015-12-26 DIAGNOSIS — M47816 Spondylosis without myelopathy or radiculopathy, lumbar region: Secondary | ICD-10-CM

## 2015-12-26 DIAGNOSIS — M5481 Occipital neuralgia: Secondary | ICD-10-CM

## 2015-12-26 DIAGNOSIS — M47896 Other spondylosis, lumbar region: Secondary | ICD-10-CM | POA: Insufficient documentation

## 2015-12-26 DIAGNOSIS — M51369 Other intervertebral disc degeneration, lumbar region without mention of lumbar back pain or lower extremity pain: Secondary | ICD-10-CM

## 2015-12-26 DIAGNOSIS — G609 Hereditary and idiopathic neuropathy, unspecified: Secondary | ICD-10-CM

## 2015-12-26 DIAGNOSIS — M533 Sacrococcygeal disorders, not elsewhere classified: Secondary | ICD-10-CM

## 2015-12-26 DIAGNOSIS — Z9889 Other specified postprocedural states: Secondary | ICD-10-CM

## 2015-12-26 DIAGNOSIS — M47817 Spondylosis without myelopathy or radiculopathy, lumbosacral region: Secondary | ICD-10-CM | POA: Diagnosis not present

## 2015-12-26 DIAGNOSIS — M961 Postlaminectomy syndrome, not elsewhere classified: Secondary | ICD-10-CM

## 2015-12-26 DIAGNOSIS — M791 Myalgia: Secondary | ICD-10-CM | POA: Diagnosis not present

## 2015-12-26 DIAGNOSIS — F1190 Opioid use, unspecified, uncomplicated: Secondary | ICD-10-CM | POA: Diagnosis not present

## 2015-12-26 DIAGNOSIS — M5136 Other intervertebral disc degeneration, lumbar region: Secondary | ICD-10-CM

## 2015-12-26 DIAGNOSIS — M545 Low back pain: Secondary | ICD-10-CM | POA: Diagnosis present

## 2015-12-26 MED ORDER — OXYCODONE HCL 5 MG PO TABS: ORAL_TABLET | ORAL | Status: DC

## 2015-12-26 NOTE — Progress Notes (Signed)
Safety precautions to be maintained throughout the outpatient stay will include: orient to surroundings, keep bed in low position, maintain call bell within reach at all times, provide assistance with transfer out of bed and ambulation.  

## 2015-12-26 NOTE — Patient Instructions (Addendum)
PLAN   Continue present medication oxycodone and begin Cymbalta as discussed and provided Dr. Jeananne Rama agrees with your beginning Cymbalta as we discussed  ALSO take vitamin B complex as discussed  F/U PCP Dr. Jeananne Rama for evaliation of  BP and general medical condition   F/U surgical evaluation. Follow-up surgical evaluation [facility] as discussed  F/U GI evaluation status post hospitalization for GI condition  F/U neurological evaluation. May consider pending follow-up evaluations  May consider radiofrequency rhizolysis or intraspinal procedures pending response to present treatment and F/U evaluation   Patient to call Pain Management Center should patient have concerns prior to scheduled return appointment.

## 2015-12-26 NOTE — Progress Notes (Signed)
   Subjective:    Patient ID: Edward Schneider., male    DOB: Oct 15, 1956, 60 y.o.   MRN: [id number]  HPI The patient is a 60 year old gentleman who returns to pain management for further evaluation and treatment of pain involving the lower back lower extremity regions turning sensations of the extremities. We have discussed patient beginning Cymbalta. The patient will follow-up with Dr. Jeananne Rama regarding beginning Cymbalta. We will continue oxycodone. We have informed patient that we feel that Cymbalta will help with the burning sensation of the extremities. We will also advised patient to continue vitamin B complex. The patient was recently hospitalized and stated that he was diagnosed with colitis. The patient will follow-up with Dr. Jeananne Rama in this regard as well and we will continue patient's oxycodone as prescribed at this time. The patient denies any other trauma or change in events of daily living the call significant change in symptomatology. All agreed to suggested treatment plan.   Review of Systems     Objective:   Physical Exam  There was tenderness to palpation the paraspinal muscular treat and cervical region cervical facet region palpation which reproduces pain of degree there was mild tenderness of the splenius capitis and occipitalis muscles regions. Palpation over the region of the acromioclavicular and glenohumeral joint region was associated with mild discomfort. Patient appeared to be with bilaterally equal grip strength and Tinel and Phalen's maneuver were without increased pain of significant degree. Palpation over the thoracic region thoracic facet region was with mild tenderness to palpation with no crepitus of the thoracic region noted. Palpation over the lumbar paraspinal musculatures and lumbar facet region was attends to palpation Palpation which  produced moderate discomfort. Lateral bending rotation extension and palpation over the lumbar facet region was with moderate  discomfort as well and  extension and palpation over the lumbar facets reproduced moderately severe discomfort. Straight leg raising was tolerates approximately 30 without increased pain with dorsiflexion noted. The patient was unable to stand on tiptoes and heels. Palpation of the PSIS and PII S region reproduced moderate discomfort with mild tenderness of the greater trochanteric region and iliotibial band region. There was negative clonus negative Homans There was no costovertebral angle tenderness noted      Assessment & Plan:     Complex regional pain syndrome of the lower extremities (neuropathy of lower extremities)  Lumbar radiculopathy  Degenerative disc disease lumbar spine Multilevel degenerative changes lumbar spine with disc protrusion, facet arthropathy, foraminal narrowing, status post lumbar surgery.       PLAN   Continue present medication oxycodone and begin Cymbalta as discussed and provided Dr. Jeananne Rama agrees with your beginning Cymbalta as we discussed  ALSO take vitamin B complex as discussed  F/U PCP Dr. Jeananne Rama for evaliation of  BP and general medical condition   F/U surgical evaluation. Follow-up surgical evaluation [facility] as discussed  F/U GI evaluation status post hospitalization for GI condition  F/U neurological evaluation. May consider pending follow-up evaluations  May consider radiofrequency rhizolysis or intraspinal procedures pending response to present treatment and F/U evaluation   Patient to call Pain Management Center should patient have concerns prior to scheduled return appointment.

## 2016-01-10 ENCOUNTER — Other Ambulatory Visit: Payer: Self-pay | Admitting: Pain Medicine

## 2016-01-23 ENCOUNTER — Other Ambulatory Visit: Payer: Self-pay | Admitting: Pain Medicine

## 2016-01-23 ENCOUNTER — Ambulatory Visit: Payer: Medicare Other | Attending: Pain Medicine | Admitting: Pain Medicine

## 2016-01-23 ENCOUNTER — Encounter: Payer: Self-pay | Admitting: Pain Medicine

## 2016-01-23 VITALS — BP 131/91 | HR 67 | Temp 97.7°F | Resp 16 | Ht 68.0 in | Wt 217.0 lb

## 2016-01-23 DIAGNOSIS — G90523 Complex regional pain syndrome I of lower limb, bilateral: Secondary | ICD-10-CM | POA: Diagnosis not present

## 2016-01-23 DIAGNOSIS — M5116 Intervertebral disc disorders with radiculopathy, lumbar region: Secondary | ICD-10-CM | POA: Insufficient documentation

## 2016-01-23 DIAGNOSIS — M5416 Radiculopathy, lumbar region: Secondary | ICD-10-CM

## 2016-01-23 DIAGNOSIS — M545 Low back pain: Secondary | ICD-10-CM | POA: Diagnosis present

## 2016-01-23 DIAGNOSIS — Z9889 Other specified postprocedural states: Secondary | ICD-10-CM

## 2016-01-23 DIAGNOSIS — M47896 Other spondylosis, lumbar region: Secondary | ICD-10-CM | POA: Insufficient documentation

## 2016-01-23 DIAGNOSIS — M47817 Spondylosis without myelopathy or radiculopathy, lumbosacral region: Secondary | ICD-10-CM | POA: Diagnosis not present

## 2016-01-23 DIAGNOSIS — M792 Neuralgia and neuritis, unspecified: Secondary | ICD-10-CM

## 2016-01-23 DIAGNOSIS — M47812 Spondylosis without myelopathy or radiculopathy, cervical region: Secondary | ICD-10-CM

## 2016-01-23 DIAGNOSIS — M5481 Occipital neuralgia: Secondary | ICD-10-CM

## 2016-01-23 DIAGNOSIS — M5136 Other intervertebral disc degeneration, lumbar region: Secondary | ICD-10-CM

## 2016-01-23 DIAGNOSIS — M1288 Other specific arthropathies, not elsewhere classified, other specified site: Secondary | ICD-10-CM | POA: Insufficient documentation

## 2016-01-23 DIAGNOSIS — R200 Anesthesia of skin: Secondary | ICD-10-CM | POA: Diagnosis present

## 2016-01-23 DIAGNOSIS — G609 Hereditary and idiopathic neuropathy, unspecified: Secondary | ICD-10-CM

## 2016-01-23 DIAGNOSIS — M51369 Other intervertebral disc degeneration, lumbar region without mention of lumbar back pain or lower extremity pain: Secondary | ICD-10-CM

## 2016-01-23 DIAGNOSIS — M961 Postlaminectomy syndrome, not elsewhere classified: Secondary | ICD-10-CM

## 2016-01-23 DIAGNOSIS — M5030 Other cervical disc degeneration, unspecified cervical region: Secondary | ICD-10-CM

## 2016-01-23 DIAGNOSIS — M47816 Spondylosis without myelopathy or radiculopathy, lumbar region: Secondary | ICD-10-CM

## 2016-01-23 DIAGNOSIS — Z981 Arthrodesis status: Secondary | ICD-10-CM

## 2016-01-23 DIAGNOSIS — M533 Sacrococcygeal disorders, not elsewhere classified: Secondary | ICD-10-CM | POA: Diagnosis not present

## 2016-01-23 DIAGNOSIS — M791 Myalgia: Secondary | ICD-10-CM | POA: Diagnosis not present

## 2016-01-23 MED ORDER — OXYCODONE HCL 5 MG PO TABS: ORAL_TABLET | ORAL | Status: DC

## 2016-01-23 MED ORDER — DULOXETINE HCL 20 MG PO CPEP: ORAL_CAPSULE | ORAL | Status: DC

## 2016-01-23 NOTE — Progress Notes (Signed)
Safety precautions to be maintained throughout the outpatient stay will include: orient to surroundings, keep bed in low position, maintain call bell within reach at all times, provide assistance with transfer out of bed and ambulation.  Was in [facility] x1.5 weeks- body rejecting food cdiff

## 2016-01-23 NOTE — Patient Instructions (Addendum)
PLAN   Continue present medication oxycodone and Cymbalta as discussed   ALSO take vitamin B complex as discussed  F/U PCP Dr. Jeananne Rama for evaliation of  BP and general medical condition and for further evaluation of condition status post hospitalization as previously discussed  F/U surgical evaluation. Follow-up surgical evaluation [facility] as discussed  F/U GI evaluation status post hospitalization for GI condition as previously discussed   F/U neurological evaluation. May consider pending follow-up evaluations  May consider radiofrequency rhizolysis or intraspinal procedures pending response to present treatment and F/U evaluation   Patient to call Pain Management Center should patient have concerns prior to scheduled return appointment.Pain Management Discharge Instructions  General Discharge Instructions :  If you need to reach your doctor call: Monday-Friday 8:00 am - 4:00 pm at [phone] or toll free [phone].  After clinic hours [phone] to have operator reach doctor.  Bring all of your medication bottles to all your appointments in the pain clinic.  To cancel or reschedule your appointment with Pain Management please remember to call 24 hours in advance to avoid a fee.  Refer to the educational materials which you have been given on: General Risks, I had my Procedure. Discharge Instructions, Post Sedation.  Post Procedure Instructions:  The drugs you were given will stay in your system until tomorrow, so for the next 24 hours you should not drive, make any legal decisions or drink any alcoholic beverages.  You may eat anything you prefer, but it is better to start with liquids then soups and crackers, and gradually work up to solid foods.  Please notify your doctor immediately if you have any unusual bleeding, trouble breathing or pain that is not related to your normal pain.  Depending on the type of procedure that was done, some parts of your body may feel week  and/or numb.  This usually clears up by tonight or the next day.  Walk with the use of an assistive device or accompanied by an adult for the 24 hours.  You may use ice on the affected area for the first 24 hours.  Put ice in a Ziploc bag and cover with a towel and place against area 15 minutes on 15 minutes off.  You may switch to heat after 24 hours.

## 2016-01-23 NOTE — Progress Notes (Signed)
   Subjective:    Patient ID: Edward Schneider., male    DOB: Feb 17, 1956, 60 y.o.   MRN: [id number]  HPI  The patient is a 60 year old gentleman who returns to pain management for further evaluation and treatment of pain involving the lower back lower extremity region with burning stinging sensation of the lower extremities and upper extremities being patient's most significant symptom. The patient notes an improvement of his symptoms with the initiation of Cymbalta. The patient continues oxycodone as prescribed at this time. We discussed patient's condition and patient is undergo follow-up evaluation of his gastroenterological condition as well as follow-up with his primary care physician Dr. Jeananne Rama. We will remain available to consider patient for additional modifications of treatment regimen pending response to treatment and follow-up evaluation. At the present time patient will continue oxycodone and Cymbalta and vitamin B complex as discussed and as explained to patient on today's visit. The patient denies any trauma change in events of daily living the call significant change in symptomatology. The patient will proceed with GI evaluation as scheduled at this time. All agreed to suggested treatment plan.  Review of Systems     Objective:   Physical Exam  There was tenderness to palpation of the splenius capitis and occipitalis musculature region a mild to moderate degree with mild to moderate tenderness over the cervical facet cervical paraspinal musculature region. Palpation of the acromioclavicular and glenohumeral joint regions reproduce mild discomfort as well. There appeared to be unremarkable Spurling's maneuver.. Palpation over the thoracic region thoracic facet region was attends to palpation of moderate degree with moderate muscle spasm of the thoracic region noted. No crepitus of the thoracic region was noted. The patient appeared to be without significant increase of pain with Tinel  and Phalen's maneuver.. Patient appeared to be with bilaterally equal grip strength. Palpation over the lumbar region lumbar facet region was with moderate discomfort with increased pain with lateral bending rotation extension and palpation of the lumbar facets. Straight leg raise was tolerates approximately 30 without increased pain with dorsiflexion noted. There was slight increased sensitivity to touch of the lower extremities. There was negative clonus negative Homans. DTRs were difficult to elicit patient difficult relaxing. There was moderate tenderness of the PSIS and PII S region with minimal tenderness of the greater trochanteric region and iliotibial band region. There was negative clonus and negative Homans. Abdomen was with mild to moderate tenderness to palpation without rebound tenderness. No costovertebral tenderness noted       Assessment & Plan:     Complex regional pain syndrome of the lower extremities (neuropathy of lower extremities)  Lumbar radiculopathy  Degenerative disc disease lumbar spine Multilevel degenerative changes lumbar spine with disc protrusion, facet arthropathy, foraminal narrowing, status post lumbar surgery.       PLAN   Continue present medication oxycodone and Cymbalta as discussed   ALSO take vitamin B complex as discussed  F/U PCP Dr. Jeananne Rama for evaliation of  BP and general medical condition and for further evaluation of condition status post hospitalization as previously discussed  F/U surgical evaluation. Follow-up surgical evaluation [facility] as discussed  F/U GI evaluation status post hospitalization for GI condition as previously discussed   F/U neurological evaluation. May consider pending follow-up evaluations  May consider radiofrequency rhizolysis or intraspinal procedures pending response to present treatment and F/U evaluation   Patient to call Pain Management Center should patient have concerns prior to scheduled return  appointment

## 2016-01-29 ENCOUNTER — Encounter: Payer: Self-pay | Admitting: Gastroenterology

## 2016-01-29 ENCOUNTER — Ambulatory Visit (INDEPENDENT_AMBULATORY_CARE_PROVIDER_SITE_OTHER): Payer: Medicare Other | Admitting: Gastroenterology

## 2016-01-29 VITALS — BP 146/91 | HR 57 | Temp 97.6°F | Ht 68.0 in | Wt 226.0 lb

## 2016-01-29 DIAGNOSIS — R140 Abdominal distension (gaseous): Secondary | ICD-10-CM | POA: Diagnosis not present

## 2016-01-29 DIAGNOSIS — R197 Diarrhea, unspecified: Secondary | ICD-10-CM | POA: Diagnosis not present

## 2016-01-29 NOTE — Progress Notes (Signed)
Primary Care Physician: Golden Pop, MD  Primary Gastroenterologist:  Dr. Lucilla Lame  Chief Complaint  Patient presents with  . Hospitalization Follow-up  . Hx of C-diff    diarrhea    HPI: Edward Schneider. is a 60 y.o. male here For follow-up of GI problem. 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 The patient was followed by Dr. Vira Agar for some time but switched to me. The patient continues to report abdominal bloating with 10 bowel movements a day. The patient states that he sleeps through the entire night without any bowel movements but does have a bowel movement within an hour of waking up. The patient denies any unexplained weight loss and reports that he has been gaining weight. The patient also reports diffuse abdominal pain that inhibits him from bending over. He also reports that it is painful when he gets dressed. He also reports that he has intermittent bloating of his abdomen.  Current Outpatient Prescriptions  Medication Sig Dispense Refill  . diphenhydrAMINE (BENADRYL) 25 mg capsule Take 25 mg by mouth at bedtime.    . DULoxetine (CYMBALTA) 20 MG capsule Limit 1 capsule by mouth per day if tolerated 30 capsule 0  . EPINEPHrine (EPIPEN 2-PAK) 0.3 mg/0.3 mL IJ SOAJ injection Inject 0.3 mLs (0.3 mg total) into the muscle once. 2 Device 12  . Korean Ginseng 520 MG CAPS Take 520 mg by mouth every morning.     . Multiple Vitamins-Minerals (CENTRUM SILVER PO) Take 1 tablet by mouth every morning.    Marland Kitchen omeprazole (PRILOSEC OTC) 20 MG tablet Take 20 mg by mouth 2 (two) times daily.    Marland Kitchen oxyCODONE (ROXICODONE) 5 MG immediate release tablet Limit one tablet by mouth 3-6  times per day if tolerated 170 tablet 0  . SUMAtriptan (IMITREX) 100 MG tablet Take 1 tablet (100 mg total) by mouth once. May repeat in 2 hours if headache persists or recurs. 10 tablet 6  . traZODone (DESYREL) 50 MG tablet Take 0.5-1 tablets (25-50 mg total) by mouth at bedtime as needed  for sleep. 30 tablet 3  . umeclidinium-vilanterol (ANORO ELLIPTA) 62.5-25 MCG/INH AEPB Inhale 1 puff into the lungs daily. 1 each 12  . vancomycin (VANCOCIN) 50 mg/mL oral solution Take 2.5 mLs (125 mg total) by mouth every 6 (six) hours. 10 mL 10   No current facility-administered medications for this visit.    Allergies as of 01/29/2016 - Review Complete 01/29/2016  Allergen Reaction Noted  . Bee pollen Anaphylaxis 04/25/2015  . Shrimp [shellfish allergy] Anaphylaxis 04/16/2015  . Fentanyl Itching 03/27/2015  . Gabapentin Diarrhea 04/25/2015  . Morphine Itching 10/10/2015  . Morphine and related Itching 03/27/2015  . Simvastatin Diarrhea 04/25/2015  . Buprenorphine hcl Itching 10/10/2015    ROS:  General: Negative for anorexia, weight loss, fever, chills, fatigue, weakness. ENT: Negative for hoarseness, difficulty swallowing , nasal congestion. CV: Negative for chest pain, angina, palpitations, dyspnea on exertion, peripheral edema.  Respiratory: Negative for dyspnea at rest, dyspnea on exertion, cough, sputum, wheezing.  GI: See history of present illness. GU:  Negative for dysuria, hematuria, urinary incontinence, urinary frequency, nocturnal urination.  Endo: Negative for unusual weight change.    Physical Examination:   BP 146/91 mmHg  Pulse 57  Temp(Src) 97.6 F (36.4 C) (Oral)  Ht 5\' 8"  (1.727 m)  Wt 226 lb (102.513 kg)  BMI 34.37 kg/m2  General: Well-nourished, well-developed in no acute distress.  Eyes: No icterus. Conjunctivae pink.  Mouth: Oropharyngeal mucosa moist and pink , no lesions erythema or exudate. Lungs: Clear to auscultation bilaterally. Non-labored. Heart: Regular rate and rhythm, no murmurs rubs or gallops.  Abdomen: Bowel sounds are normal, usually tender to 1 finger palpation while flexing the abdominal wall muscles., nondistended, no hepatosplenomegaly or masses, no abdominal bruits or hernia , no rebound or guarding.   Extremities: No lower  extremity edema. No clubbing or deformities. Neuro: Alert and oriented x 3.  Grossly intact. Skin: Warm and dry, no jaundice.   Psych: Alert and cooperative, normal mood and affect.  Labs:    Imaging Studies: No results found.  Assessment and Plan:   Edward Schneider. is a 60 y.o. y/o male who has a history of C. difficile colitis in the past. The patient has bloating and abdominal discomfort. The patient also was found to have musculoskeletal abdominal pain. The patient is not being woken up by his stools during the night but is having 10 bowel movements a day. The patient will be checked for C. difficile colitis again. He'll also be started on probiotics.   Note: This dictation was prepared with Dragon dictation along with smaller phrase technology. Any transcriptional errors that result from this process are unintentional.

## 2016-02-10 DIAGNOSIS — R197 Diarrhea, unspecified: Secondary | ICD-10-CM | POA: Diagnosis not present

## 2016-02-11 LAB — CLOSTRIDIUM DIFFICILE BY PCR: CDIFFPCR: POSITIVE — AB

## 2016-02-13 ENCOUNTER — Telehealth: Payer: Self-pay

## 2016-02-13 MED ORDER — METRONIDAZOLE 500 MG PO TABS: 500.0000 mg | ORAL_TABLET | Freq: Three times a day (TID) | ORAL | Status: DC

## 2016-02-13 NOTE — Telephone Encounter (Signed)
-----   Message from Lucilla Lame, MD sent at 02/13/2016 11:53 AM EDT ----- At this patient know that his C. difficile came back positive. He needs to be started on Flagyl 500 mg 3 times a day for 3 weeks.

## 2016-02-13 NOTE — Telephone Encounter (Signed)
Patient returned your phone call for results. He stated you can leave a message on his answering machine.

## 2016-02-13 NOTE — Telephone Encounter (Signed)
Left vm for pt to return my call.  

## 2016-02-14 NOTE — Telephone Encounter (Signed)
Patient called stating that he needed to know his stool test results. I told him that it showed positive for C-Diff and that a prescription was sent to his pharmacy at [facility] [address]. Patient stated that he would go and pick it up. However, patient stated that this is his fifth time receiving treatment. He also stated that his abdomen is hurting him even more. I told him that I would ask Dr. Allen Norris what else we could do for him and that I would call him back with an answer. Patient understood. I told patient to start his antibiotic and to make sure he finishes them.

## 2016-02-17 ENCOUNTER — Other Ambulatory Visit: Payer: Self-pay

## 2016-02-17 DIAGNOSIS — A09 Infectious gastroenteritis and colitis, unspecified: Secondary | ICD-10-CM

## 2016-02-17 MED ORDER — VANCOMYCIN HCL 125 MG PO CAPS: 125.0000 mg | ORAL_CAPSULE | Freq: Three times a day (TID) | ORAL | Status: DC

## 2016-02-18 ENCOUNTER — Telehealth: Payer: Self-pay

## 2016-02-18 NOTE — Telephone Encounter (Signed)
Lucilla Lame, MD  Wayna Chalet, CMA           I know he has had it multiple times. All I can say it it is the first time he is following up with me. He use to see the [facility]. i would recommend avoiding Abx unless he needs it and he should be treated for C. Diff preventively when getting other antibiotics.       Previous Messages     ----- Message -----   From: Wayna Chalet, CMA   Sent: 02/14/2016 11:13 AM    To: Lucilla Lame, MD  Subject: Question                     Patient called stating that he continues to have abdomina pain. He also stated that this will be his fifth time getting treatment for positive C-Diff. He wanted to know what else he needed to do to prevent getting C-Diff.  Patient stated that he had done a colonoscopy at [facility] from [facility] on 2014 and had polyps.

## 2016-02-19 ENCOUNTER — Encounter: Payer: Self-pay | Admitting: Pain Medicine

## 2016-02-19 ENCOUNTER — Ambulatory Visit: Payer: Medicare Other | Attending: Pain Medicine | Admitting: Pain Medicine

## 2016-02-19 VITALS — BP 142/87 | HR 58 | Temp 97.4°F | Resp 16 | Ht 68.0 in | Wt 214.0 lb

## 2016-02-19 DIAGNOSIS — M5126 Other intervertebral disc displacement, lumbar region: Secondary | ICD-10-CM | POA: Diagnosis not present

## 2016-02-19 DIAGNOSIS — M47817 Spondylosis without myelopathy or radiculopathy, lumbosacral region: Secondary | ICD-10-CM | POA: Diagnosis not present

## 2016-02-19 DIAGNOSIS — M79603 Pain in arm, unspecified: Secondary | ICD-10-CM | POA: Diagnosis present

## 2016-02-19 DIAGNOSIS — M533 Sacrococcygeal disorders, not elsewhere classified: Secondary | ICD-10-CM | POA: Diagnosis not present

## 2016-02-19 DIAGNOSIS — M47812 Spondylosis without myelopathy or radiculopathy, cervical region: Secondary | ICD-10-CM

## 2016-02-19 DIAGNOSIS — Z9889 Other specified postprocedural states: Secondary | ICD-10-CM | POA: Diagnosis not present

## 2016-02-19 DIAGNOSIS — M5116 Intervertebral disc disorders with radiculopathy, lumbar region: Secondary | ICD-10-CM | POA: Insufficient documentation

## 2016-02-19 DIAGNOSIS — M4726 Other spondylosis with radiculopathy, lumbar region: Secondary | ICD-10-CM | POA: Diagnosis not present

## 2016-02-19 DIAGNOSIS — G609 Hereditary and idiopathic neuropathy, unspecified: Secondary | ICD-10-CM

## 2016-02-19 DIAGNOSIS — M47816 Spondylosis without myelopathy or radiculopathy, lumbar region: Secondary | ICD-10-CM

## 2016-02-19 DIAGNOSIS — M961 Postlaminectomy syndrome, not elsewhere classified: Secondary | ICD-10-CM

## 2016-02-19 DIAGNOSIS — G90523 Complex regional pain syndrome I of lower limb, bilateral: Secondary | ICD-10-CM | POA: Diagnosis not present

## 2016-02-19 DIAGNOSIS — M79606 Pain in leg, unspecified: Secondary | ICD-10-CM | POA: Diagnosis present

## 2016-02-19 DIAGNOSIS — M791 Myalgia: Secondary | ICD-10-CM | POA: Diagnosis not present

## 2016-02-19 DIAGNOSIS — M792 Neuralgia and neuritis, unspecified: Secondary | ICD-10-CM

## 2016-02-19 DIAGNOSIS — M5030 Other cervical disc degeneration, unspecified cervical region: Secondary | ICD-10-CM

## 2016-02-19 DIAGNOSIS — M5416 Radiculopathy, lumbar region: Secondary | ICD-10-CM | POA: Diagnosis not present

## 2016-02-19 DIAGNOSIS — Z981 Arthrodesis status: Secondary | ICD-10-CM

## 2016-02-19 DIAGNOSIS — M5136 Other intervertebral disc degeneration, lumbar region: Secondary | ICD-10-CM

## 2016-02-19 DIAGNOSIS — M546 Pain in thoracic spine: Secondary | ICD-10-CM | POA: Diagnosis present

## 2016-02-19 DIAGNOSIS — M5481 Occipital neuralgia: Secondary | ICD-10-CM

## 2016-02-19 MED ORDER — DULOXETINE HCL 20 MG PO CPEP: ORAL_CAPSULE | ORAL | Status: DC

## 2016-02-19 MED ORDER — OXYCODONE HCL 5 MG PO TABS: ORAL_TABLET | ORAL | Status: DC

## 2016-02-19 NOTE — Progress Notes (Signed)
   Subjective:    Patient ID: Edward Schneider., male    DOB: Nov 26, 1955, 60 y.o.   MRN: [id number]  HPI  The patient is a 60 year old gentleman who returns to pain management for further evaluation and treatment of pain involving the region of the upper and lower extremity regions predominantly. The patient states that he has had improvement of his pain with the medications Cymbalta and oxycodone. We discussed further treatment and patient at the present time we will continue with noninterventional treatment measures consisting of OxyContin and Cymbalta. May consider patient for interventional treatment pending follow-up evaluations as discussed as well as consider modifying to present treatment regimen. On today's visit we discussed an increase of the Cymbalta and decision was made to continue Cymbalta as presently prescribed area we will consider an increase of Cymbalta at time of return appointment as discussed with patient on today's visit. The patient is very pleased with the decrease in his pain with the present treatment regimen and is with understanding and wished to continue with treatment plan as recommended. All agreed to suggested treatment plan  Review of Systems     Objective:   Physical Exam  There was tends to palpation of paraspinal misreading cervical region cervical facet region with tenderness over the splenius capitis and occipitalis regions of mild to moderate degree with mild to moderate tenderness of the acromioclavicular and glenohumeral joint region. The patient appeared to be unremarkable Spurling's maneuver. Palpation over the region of the thoracic facet thoracic paraspinal must reason was with moderate discomfort with moderate muscle spasms noted in thoracic paraspinal musculature region. There was no crepitus of the thoracic region noted. The patient appeared to be with bilaterally equal grip strength and Tinel and Phalen's maneuver were without increased pain of  significant degree. Palpation over the lumbar paraspinal must reason lumbar facet region was attends to palpation with lateral bending rotation extension and palpation of the lumbar facets reproducing moderate discomfort. Straight leg raising tolerates approximately 30 without increased pain with dorsiflexion noted. There was negative clonus negative Homan's . There was tenderness over the PSIS and PII S region a moderate degree with mild tenderness of the greater trochanteric region iliotibial band region. EHL strength appeared to be decreased. No definite sensory deficit or dermatomal distribution detected. There appeared to be decreased sensation of the lower extremities in a stocking-type distribution. Clonus negative Homans. Abdomen nontender with no costovertebral tenderness noted      Assessment & Plan:    Complex regional pain syndrome of the lower extremities (neuropathy of lower extremities)  Lumbar radiculopathy  Degenerative disc disease lumbar spine Multilevel degenerative changes lumbar spine with disc protrusion, facet arthropathy, foraminal narrowing, status post lumbar surgery.     PLAN   Continue present medication oxycodone and Cymbalta as discussed   ALSO take vitamin B complex as discussed  F/U PCP Dr. Jeananne Rama for evaliation of  BP and general medical condition   F/U surgical evaluation. Follow-up surgical evaluation [facility] as discussed  F/U GI evaluation status post hospitalization for GI condition as previously discussed   F/U neurological evaluation. May consider pending follow-up evaluations  May consider radiofrequency rhizolysis or intraspinal procedures pending response to present treatment and F/U evaluation   Patient to call [facility] should patient have concerns prior to scheduled return appointment

## 2016-02-19 NOTE — Progress Notes (Signed)
Safety precautions to be maintained throughout the outpatient stay will include: orient to surroundings, keep bed in low position, maintain call bell within reach at all times, provide assistance with transfer out of bed and ambulation.  

## 2016-02-19 NOTE — Patient Instructions (Signed)
PLAN   Continue present medication oxycodone and Cymbalta as discussed   ALSO take vitamin B complex as discussed  F/U PCP Dr. Jeananne Rama for evaliation of  BP and general medical condition and for further evaluation of condition status post hospitalization as previously discussed  F/U surgical evaluation. Follow-up surgical evaluation [facility] as discussed  F/U GI evaluation status post hospitalization for GI condition as previously discussed   F/U neurological evaluation. May consider pending follow-up evaluations  May consider radiofrequency rhizolysis or intraspinal procedures pending response to present treatment and F/U evaluation   Patient to call [facility] should patient have concerns prior to scheduled return appointment

## 2016-02-21 DIAGNOSIS — R109 Unspecified abdominal pain: Secondary | ICD-10-CM | POA: Diagnosis not present

## 2016-02-21 DIAGNOSIS — R197 Diarrhea, unspecified: Secondary | ICD-10-CM | POA: Diagnosis not present

## 2016-02-21 DIAGNOSIS — A047 Enterocolitis due to Clostridium difficile: Secondary | ICD-10-CM | POA: Diagnosis not present

## 2016-02-26 ENCOUNTER — Telehealth: Payer: Self-pay

## 2016-02-26 NOTE — Telephone Encounter (Signed)
Patient called in and would like C-Diff results. Please call.

## 2016-02-26 NOTE — Telephone Encounter (Signed)
Advised pt his stool sample that was sent to [facility] laboratory is still pending and should be available by Friday. Pt is to continue Flagyl until we receive results.

## 2016-02-28 ENCOUNTER — Encounter: Payer: Self-pay | Admitting: Gastroenterology

## 2016-03-03 ENCOUNTER — Other Ambulatory Visit: Payer: Self-pay

## 2016-03-03 ENCOUNTER — Telehealth: Payer: Self-pay | Admitting: Gastroenterology

## 2016-03-03 DIAGNOSIS — R197 Diarrhea, unspecified: Secondary | ICD-10-CM

## 2016-03-03 DIAGNOSIS — R112 Nausea with vomiting, unspecified: Secondary | ICD-10-CM

## 2016-03-03 MED ORDER — ONDANSETRON HCL 4 MG PO TABS: 4.0000 mg | ORAL_TABLET | Freq: Four times a day (QID) | ORAL | Status: DC | PRN

## 2016-03-03 MED ORDER — PEG 3350-KCL-NABCB-NACL-NASULF 236 G PO SOLR: 4000.0000 mL | Freq: Once | ORAL | Status: DC

## 2016-03-03 NOTE — Telephone Encounter (Signed)
Patient is waiting for your call. Throwing up and diarrhea getting worse.

## 2016-03-03 NOTE — Telephone Encounter (Signed)
Contacted pt and advised him of his GI profile results. Per Dr. Allen Norris, Pt needs to be set up for Colonoscopy. Ok'd by Dr. Allen Norris to send pt a rx for Zofran to help with nausea.

## 2016-03-04 ENCOUNTER — Encounter: Payer: Self-pay | Admitting: *Deleted

## 2016-03-05 NOTE — Discharge Instructions (Signed)

## 2016-03-06 ENCOUNTER — Ambulatory Visit: Payer: Medicare Other | Admitting: Anesthesiology

## 2016-03-06 ENCOUNTER — Ambulatory Visit
Admission: RE | Admit: 2016-03-06 | Discharge: 2016-03-06 | Disposition: A | Discharge status: Home / Self Care | Payer: Medicare Other | Source: Ambulatory Visit | Attending: Gastroenterology | Admitting: Gastroenterology

## 2016-03-06 ENCOUNTER — Encounter
Admission: RE | Disposition: A | Discharge status: Home / Self Care | Payer: Self-pay | Source: Ambulatory Visit | Attending: Gastroenterology

## 2016-03-06 DIAGNOSIS — K529 Noninfective gastroenteritis and colitis, unspecified: Secondary | ICD-10-CM | POA: Diagnosis not present

## 2016-03-06 DIAGNOSIS — M5136 Other intervertebral disc degeneration, lumbar region: Secondary | ICD-10-CM | POA: Insufficient documentation

## 2016-03-06 DIAGNOSIS — G709 Myoneural disorder, unspecified: Secondary | ICD-10-CM | POA: Diagnosis not present

## 2016-03-06 DIAGNOSIS — I739 Peripheral vascular disease, unspecified: Secondary | ICD-10-CM | POA: Insufficient documentation

## 2016-03-06 DIAGNOSIS — Z8673 Personal history of transient ischemic attack (TIA), and cerebral infarction without residual deficits: Secondary | ICD-10-CM | POA: Insufficient documentation

## 2016-03-06 DIAGNOSIS — Z8619 Personal history of other infectious and parasitic diseases: Secondary | ICD-10-CM | POA: Diagnosis not present

## 2016-03-06 DIAGNOSIS — Z8505 Personal history of malignant neoplasm of liver: Secondary | ICD-10-CM | POA: Insufficient documentation

## 2016-03-06 DIAGNOSIS — I10 Essential (primary) hypertension: Secondary | ICD-10-CM | POA: Diagnosis not present

## 2016-03-06 DIAGNOSIS — R197 Diarrhea, unspecified: Secondary | ICD-10-CM | POA: Diagnosis not present

## 2016-03-06 DIAGNOSIS — J449 Chronic obstructive pulmonary disease, unspecified: Secondary | ICD-10-CM | POA: Diagnosis not present

## 2016-03-06 DIAGNOSIS — D197 Benign neoplasm of mesothelial tissue of other sites: Secondary | ICD-10-CM | POA: Diagnosis not present

## 2016-03-06 DIAGNOSIS — Z87891 Personal history of nicotine dependence: Secondary | ICD-10-CM | POA: Diagnosis not present

## 2016-03-06 DIAGNOSIS — M5030 Other cervical disc degeneration, unspecified cervical region: Secondary | ICD-10-CM | POA: Diagnosis not present

## 2016-03-06 DIAGNOSIS — K641 Second degree hemorrhoids: Secondary | ICD-10-CM | POA: Diagnosis not present

## 2016-03-06 DIAGNOSIS — K219 Gastro-esophageal reflux disease without esophagitis: Secondary | ICD-10-CM | POA: Insufficient documentation

## 2016-03-06 HISTORY — DX: Gastro-esophageal reflux disease without esophagitis: K21.9

## 2016-03-06 HISTORY — DX: Headache, unspecified: R51.9

## 2016-03-06 HISTORY — DX: Presence of dental prosthetic device (complete) (partial): Z97.2

## 2016-03-06 HISTORY — DX: Cerebral infarction, unspecified: I63.9

## 2016-03-06 HISTORY — DX: Headache: R51

## 2016-03-06 HISTORY — PX: COLONOSCOPY WITH PROPOFOL: SHX5780

## 2016-03-06 SURGERY — COLONOSCOPY WITH PROPOFOL
Anesthesia: Monitor Anesthesia Care | Wound class: Contaminated

## 2016-03-06 MED ORDER — LACTATED RINGERS IV SOLN
INTRAVENOUS | Status: DC
  Administered 2016-03-06: 07:00:00 via INTRAVENOUS

## 2016-03-06 MED ORDER — LIDOCAINE HCL (CARDIAC) 20 MG/ML IV SOLN
INTRAVENOUS | Status: DC | PRN
  Administered 2016-03-06: 40 mg via INTRAVENOUS

## 2016-03-06 MED ORDER — STERILE WATER FOR IRRIGATION IR SOLN
Status: DC | PRN
  Administered 2016-03-06: 08:00:00

## 2016-03-06 MED ORDER — ONDANSETRON HCL 4 MG/2ML IJ SOLN: 4.0000 mg | Freq: Once | INTRAMUSCULAR | Status: DC | PRN

## 2016-03-06 MED ORDER — PROPOFOL 10 MG/ML IV BOLUS
INTRAVENOUS | Status: DC | PRN
  Administered 2016-03-06: 20 mg via INTRAVENOUS
  Administered 2016-03-06 (×4): 50 mg via INTRAVENOUS

## 2016-03-06 MED ORDER — ACETAMINOPHEN 160 MG/5ML PO SOLN: 325.0000 mg | ORAL | Status: DC | PRN

## 2016-03-06 MED ORDER — ACETAMINOPHEN 325 MG PO TABS: 325.0000 mg | ORAL_TABLET | ORAL | Status: DC | PRN

## 2016-03-06 SURGICAL SUPPLY — 22 items
CANISTER SUCT 1200ML W/VALVE (MISCELLANEOUS) ×3 IMPLANT
CLIP HMST 235XBRD CATH ROT (MISCELLANEOUS) IMPLANT
CLIP RESOLUTION 360 11X235 (MISCELLANEOUS)
FCP ESCP3.2XJMB 240X2.8X (MISCELLANEOUS)
FORCEPS BIOP RAD 4 LRG CAP 4 (CUTTING FORCEPS) ×2 IMPLANT
FORCEPS BIOP RJ4 240 W/NDL (MISCELLANEOUS)
FORCEPS ESCP3.2XJMB 240X2.8X (MISCELLANEOUS) IMPLANT
GOWN CVR UNV OPN BCK APRN NK (MISCELLANEOUS) ×2 IMPLANT
GOWN ISOL THUMB LOOP REG UNIV (MISCELLANEOUS) ×6
INJECTOR VARIJECT VIN23 (MISCELLANEOUS) IMPLANT
KIT DEFENDO VALVE AND CONN (KITS) IMPLANT
KIT ENDO PROCEDURE OLY (KITS) ×3 IMPLANT
MARKER SPOT ENDO TATTOO 5ML (MISCELLANEOUS) IMPLANT
PAD GROUND ADULT SPLIT (MISCELLANEOUS) IMPLANT
PROBE APC STR FIRE (PROBE) IMPLANT
SNARE SHORT THROW 13M SML OVAL (MISCELLANEOUS) IMPLANT
SNARE SHORT THROW 30M LRG OVAL (MISCELLANEOUS) IMPLANT
SNARE SNG USE RND 15MM (INSTRUMENTS) IMPLANT
SPOT EX ENDOSCOPIC TATTOO (MISCELLANEOUS)
TRAP ETRAP POLY (MISCELLANEOUS) IMPLANT
VARIJECT INJECTOR VIN23 (MISCELLANEOUS)
WATER STERILE IRR 250ML POUR (IV SOLUTION) ×3 IMPLANT

## 2016-03-06 NOTE — Anesthesia Procedure Notes (Signed)
Procedure Name: MAC Performed by: Georga Bora Pre-anesthesia Checklist: Patient identified, Emergency Drugs available, Suction available, Patient being monitored and Timeout performed Patient Re-evaluated:Patient Re-evaluated prior to induction

## 2016-03-06 NOTE — Transfer of Care (Signed)
Immediate Anesthesia Transfer of Care Note  Patient: Edward Schneider.  Procedure(s) Performed: Procedure(s) with comments: COLONOSCOPY WITH PROPOFOL (N/A) - requests early  Patient Location: PACU  Anesthesia Type: MAC  Level of Consciousness: awake, alert  and patient cooperative  Airway and Oxygen Therapy: Patient Spontanous Breathing and Patient connected to supplemental oxygen  Post-op Assessment: Post-op Vital signs reviewed, Patient's Cardiovascular Status Stable, Respiratory Function Stable, Patent Airway and No signs of Nausea or vomiting  Post-op Vital Signs: Reviewed and stable  Complications: No apparent anesthesia complications

## 2016-03-06 NOTE — Anesthesia Postprocedure Evaluation (Signed)
Anesthesia Post Note  Patient: Edward Schneider.  Procedure(s) Performed: Procedure(s) (LRB): COLONOSCOPY WITH PROPOFOL (N/A)  Patient location during evaluation: PACU Anesthesia Type: MAC Level of consciousness: awake and alert Pain management: pain level controlled Vital Signs Assessment: post-procedure vital signs reviewed and stable Respiratory status: spontaneous breathing, nonlabored ventilation, respiratory function stable and patient connected to nasal cannula oxygen Cardiovascular status: stable and blood pressure returned to baseline Anesthetic complications: no    Amaryllis Dyke

## 2016-03-06 NOTE — H&P (Signed)
[facility]  [address], [place], [place] [address] Phone: [phone] Fax : [phone]  Primary Care Physician:  Golden Pop, MD Primary Gastroenterologist:  Dr. Allen Norris  Pre-Procedure History & Physical: HPI:  Edward Winrow. is a 60 y.o. male is here for an colonoscopy.   Past Medical History  Diagnosis Date  . C. difficile diarrhea   . Asthma   . Cancer [facility]) June 2016    liver cancer  . Allergy   . Hypertension   . Chronic pain   . DDD (degenerative disc disease), lumbar   . DDD (degenerative disc disease), cervical   . Low blood sugar   . Stroke [facility])     'mini-stroke" 30 yrs ago. no deficits.  . Headache     migraines - none since 02/17  . GERD (gastroesophageal reflux disease)   . Wears dentures     full upper and lower    Past Surgical History  Procedure Laterality Date  . Neck surgery    . Back surgery    . Appendectomy    . Spleen surg    . Spleen surgery    . Knee surgery Right     Prior to Admission medications   Medication Sig Start Date End Date Taking? Authorizing Provider  diphenhydrAMINE (BENADRYL) 25 mg capsule Take 25 mg by mouth at bedtime.   Yes Historical Provider, MD  DULoxetine (CYMBALTA) 20 MG capsule Limit 1 capsule by mouth per day if tolerated 02/19/16  Yes Mohammed Kindle, MD  EPINEPHrine (EPIPEN 2-PAK) 0.3 mg/0.3 mL IJ SOAJ injection Inject 0.3 mLs (0.3 mg total) into the muscle once. 12/12/15  Yes Guadalupe Maple, MD  Korean Ginseng 520 MG CAPS Take 520 mg by mouth every morning.    Yes Historical Provider, MD  Multiple Vitamins-Minerals (CENTRUM SILVER PO) Take 1 tablet by mouth every morning.   Yes Historical Provider, MD  omeprazole (PRILOSEC OTC) 20 MG tablet Take 20 mg by mouth 2 (two) times daily.   Yes Historical Provider, MD  ondansetron (ZOFRAN) 4 MG tablet Take 1 tablet (4 mg total) by mouth every 6 (six) hours as needed for nausea or vomiting. 03/03/16  Yes Lucilla Lame, MD  oxyCODONE (ROXICODONE) 5 MG  immediate release tablet Limit one tablet by mouth 3-6  times per day if tolerated 02/19/16  Yes Mohammed Kindle, MD  polyethylene glycol (GOLYTELY) 236 g solution Take 4,000 mLs by mouth once. Drink one 8 oz glass every 30 mins untils stools are clear starting at 5:00pm on 03/05/16. 03/03/16  Yes Lucilla Lame, MD  SUMAtriptan (IMITREX) 100 MG tablet Take 1 tablet (100 mg total) by mouth once. May repeat in 2 hours if headache persists or recurs. 09/23/15  Yes Guadalupe Maple, MD  traZODone (DESYREL) 50 MG tablet Take 0.5-1 tablets (25-50 mg total) by mouth at bedtime as needed for sleep. 12/24/15  Yes Guadalupe Maple, MD  umeclidinium-vilanterol (ANORO ELLIPTA) 62.5-25 MCG/INH AEPB Inhale 1 puff into the lungs daily. Patient not taking: Reported on 03/04/2016 12/12/15   Guadalupe Maple, MD    Allergies as of 03/03/2016 - Review Complete 02/19/2016  Allergen Reaction Noted  . Bee pollen Anaphylaxis 04/25/2015  . Shrimp [shellfish allergy] Anaphylaxis 04/16/2015  . Fentanyl Itching 03/27/2015  . Gabapentin Diarrhea 04/25/2015  . Morphine Itching 10/10/2015  . Morphine and related Itching 03/27/2015  . Simvastatin Diarrhea 04/25/2015  . Buprenorphine hcl Itching 10/10/2015    Family History  Problem Relation Age  of Onset  . Arthritis Mother   . Diabetes Mother   . Kidney disease Mother   . Heart disease Mother   . Hypertension Mother   . Arthritis Father   . Hearing loss Father   . Hypertension Father   . Diabetes Sister   . Heart disease Sister     Social History   Social History  . Marital Status: Married    Spouse Name: N/A  . Number of Children: N/A  . Years of Education: N/A   Occupational History  . Not on file.   Social History Main Topics  . Smoking status: Former Smoker -- 50 years    Types: Cigarettes    Quit date: 07/05/2015  . Smokeless tobacco: Never Used  . Alcohol Use: No  . Drug Use: No  . Sexual Activity: Not on file   Other Topics Concern  . Not on file    Social History Narrative    Review of Systems: See HPI, otherwise negative ROS  Physical Exam: Pulse 81  Temp(Src) 97.7 F (36.5 C) (Tympanic)  Resp 16  Ht 5\' 8"  (1.727 m)  Wt 221 lb (100.245 kg)  BMI 33.61 kg/m2 General:   Alert,  pleasant and cooperative in NAD Head:  Normocephalic and atraumatic. Neck:  Supple; no masses or thyromegaly. Lungs:  Clear throughout to auscultation.    Heart:  Regular rate and rhythm. Abdomen:  Soft, nontender and nondistended. Normal bowel sounds, without guarding, and without rebound.   Neurologic:  Alert and  oriented x4;  grossly normal neurologically.  Impression/Plan: Edward Flesher. is here for an colonoscopy to be performed for diarrhea  Risks, benefits, limitations, and alternatives regarding  colonoscopy have been reviewed with the patient.  Questions have been answered.  All parties agreeable.   Lucilla Lame, MD  03/06/2016, 7:24 AM

## 2016-03-06 NOTE — Anesthesia Preprocedure Evaluation (Signed)
Anesthesia Evaluation  Patient identified by MRN, date of birth, ID band Patient awake    Reviewed: Allergy & Precautions, H&P , NPO status   Airway Mallampati: II  TM Distance: >3 FB Neck ROM: full    Dental  (+) Upper Dentures, Lower Dentures   Pulmonary asthma , COPD, former smoker,    breath sounds clear to auscultation       Cardiovascular hypertension, + Peripheral Vascular Disease  Normal cardiovascular exam     Neuro/Psych  Headaches,  Neuromuscular disease CVA    GI/Hepatic GERD  ,  Endo/Other    Renal/GU      Musculoskeletal   Abdominal   Peds  Hematology   Anesthesia Other Findings   Reproductive/Obstetrics                             Anesthesia Physical Anesthesia Plan  ASA: II  Anesthesia Plan: MAC   Post-op Pain Management:    Induction:   Airway Management Planned:   Additional Equipment:   Intra-op Plan:   Post-operative Plan:   Informed Consent: I have reviewed the patients History and Physical, chart, labs and discussed the procedure including the risks, benefits and alternatives for the proposed anesthesia with the patient or authorized representative who has indicated his/her understanding and acceptance.     Plan Discussed with: CRNA  Anesthesia Plan Comments:         Anesthesia Quick Evaluation

## 2016-03-06 NOTE — Op Note (Signed)
[facility] Patient Name: Edward Schneider Procedure Date: 03/06/2016 7:33 AM MRN: [id number] Account #: [id number] Date of Birth: Sep 20, 1956 Admit Type: Outpatient Age: 60 Room: [facility] OR ROOM 01 Gender: Male Note Status: Finalized Procedure:            Colonoscopy Indications:          Chronic diarrhea, Incidental change in bowel habits                        noted, Incidental diarrhea noted Providers:            Lucilla Lame, MD Referring MD:         Guadalupe Maple, MD (Referring MD) Medicines:            Propofol per Anesthesia Complications:        No immediate complications. Procedure:            Pre-Anesthesia Assessment:                       - Prior to the procedure, a History and Physical was                        performed, and patient medications and allergies were                        reviewed. The patient's tolerance of previous                        anesthesia was also reviewed. The risks and benefits of                        the procedure and the sedation options and risks were                        discussed with the patient. All questions were                        answered, and informed consent was obtained. Prior                        Anticoagulants: The patient has taken no previous                        anticoagulant or antiplatelet agents. ASA Grade                        Assessment: II - A patient with mild systemic disease.                        After reviewing the risks and benefits, the patient was                        deemed in satisfactory condition to undergo the                        procedure.                       After obtaining informed consent, the colonoscope was  passed under direct vision. Throughout the procedure,                        the patient's blood pressure, pulse, and oxygen                        saturations were monitored continuously. The Olympus CF        H180AL colonoscope (S#: [id number]) was introduced through                        the anus and advanced to the the terminal ileum. The                        colonoscopy was performed without difficulty. The                        patient tolerated the procedure well. The quality of                        the bowel preparation was fair. Findings:      The perianal and digital rectal examinations were normal.      Non-bleeding internal hemorrhoids were found during retroflexion. The       hemorrhoids were Grade II (internal hemorrhoids that prolapse but reduce       spontaneously).      The terminal ileum appeared normal. Biopsies were taken with a cold       forceps for histology. Biopsies were taken with a cold forceps for       histology.      Random biopsies were obtained with cold forceps for histology randomly       in the entire colon. Impression:           - Preparation of the colon was fair.                       - Non-bleeding internal hemorrhoids.                       - The examined portion of the ileum was normal.                        Biopsied.                       - Random biopsies were obtained in the entire colon. Recommendation:       - Await pathology results. Procedure Code(s):    --- Professional ---                       [phone], Colonoscopy, flexible; with biopsy, single or                        multiple Diagnosis Code(s):    --- Professional ---                       K52.9, Noninfective gastroenteritis and colitis,                        unspecified CPT copyright 2016 [employer]. All rights reserved. The codes documented in this report are preliminary and upon coder review may  be revised to meet current compliance requirements. Lucilla Lame, MD 03/06/2016 7:53:43 AM This report has been signed electronically. Number of Addenda: 0 Note Initiated On: 03/06/2016 7:33 AM Scope Withdrawal Time: 0 hours 9 minutes 47 seconds  Total Procedure  Duration: 0 hours 12 minutes 53 seconds       [facility]

## 2016-03-09 ENCOUNTER — Encounter: Payer: Self-pay | Admitting: Gastroenterology

## 2016-03-17 ENCOUNTER — Telehealth: Payer: Self-pay

## 2016-03-17 ENCOUNTER — Other Ambulatory Visit: Payer: Self-pay

## 2016-03-17 ENCOUNTER — Telehealth: Payer: Self-pay | Admitting: Family Medicine

## 2016-03-17 MED ORDER — BIS SUBCIT-METRONID-TETRACYC 140-125-125 MG PO CAPS: 3.0000 | ORAL_CAPSULE | Freq: Three times a day (TID) | ORAL | Status: DC

## 2016-03-17 MED ORDER — DIPHENOXYLATE-ATROPINE 2.5-0.025 MG PO TABS: 1.0000 | ORAL_TABLET | Freq: Four times a day (QID) | ORAL | Status: DC | PRN

## 2016-03-17 NOTE — Telephone Encounter (Signed)
Pt came by stated he needs an RX for a power lift chair from Dr. Jeananne Rama so his insurance will cover the cost. Please call pt when RX is ready for pick up. Thanks.

## 2016-03-17 NOTE — Telephone Encounter (Signed)
Left VM for patient that Rx is ready but will need to schedule appointment for face to face for insurance. Can schedule with Apolonio Schneiders

## 2016-03-17 NOTE — Telephone Encounter (Signed)
-----   Message from Lucilla Lame, MD sent at 03/05/2016 12:31 PM EDT ----- This patient needs to be set up for colonoscopy due to his blood in his stool he also needs to be treated for H. pylori.

## 2016-03-17 NOTE — Telephone Encounter (Signed)
Pt was notified of H pylori dx and was given samples of Pylera due to insurance issues. Pt was scheduled for a colonoscopy at [facility] on 03/06/16.

## 2016-03-17 NOTE — Telephone Encounter (Signed)
Call pt fill in

## 2016-03-17 NOTE — Telephone Encounter (Signed)
Pt of results and rx for Lomotil. This was called into [place], [employer]. Pt aware.

## 2016-03-17 NOTE — Telephone Encounter (Signed)
-----   Message from Lucilla Lame, MD sent at 03/11/2016  7:10 AM EDT ----- Let the patient know that the biopsies of his colon and the small bowel did not show any cause for his diarrhea. The patient should be tried on Lomotil.

## 2016-03-18 ENCOUNTER — Ambulatory Visit: Payer: Medicare Other | Attending: Pain Medicine | Admitting: Pain Medicine

## 2016-03-18 ENCOUNTER — Encounter: Payer: Self-pay | Admitting: Pain Medicine

## 2016-03-18 VITALS — BP 132/80 | HR 62 | Temp 97.8°F | Resp 16 | Ht 68.0 in | Wt 214.0 lb

## 2016-03-18 DIAGNOSIS — R202 Paresthesia of skin: Secondary | ICD-10-CM | POA: Diagnosis not present

## 2016-03-18 DIAGNOSIS — Z981 Arthrodesis status: Secondary | ICD-10-CM

## 2016-03-18 DIAGNOSIS — Z9889 Other specified postprocedural states: Secondary | ICD-10-CM | POA: Diagnosis not present

## 2016-03-18 DIAGNOSIS — M47812 Spondylosis without myelopathy or radiculopathy, cervical region: Secondary | ICD-10-CM

## 2016-03-18 DIAGNOSIS — M5136 Other intervertebral disc degeneration, lumbar region: Secondary | ICD-10-CM

## 2016-03-18 DIAGNOSIS — M5030 Other cervical disc degeneration, unspecified cervical region: Secondary | ICD-10-CM

## 2016-03-18 DIAGNOSIS — M79602 Pain in left arm: Secondary | ICD-10-CM | POA: Diagnosis present

## 2016-03-18 DIAGNOSIS — M533 Sacrococcygeal disorders, not elsewhere classified: Secondary | ICD-10-CM | POA: Diagnosis not present

## 2016-03-18 DIAGNOSIS — M47896 Other spondylosis, lumbar region: Secondary | ICD-10-CM | POA: Insufficient documentation

## 2016-03-18 DIAGNOSIS — M5116 Intervertebral disc disorders with radiculopathy, lumbar region: Secondary | ICD-10-CM | POA: Insufficient documentation

## 2016-03-18 DIAGNOSIS — G609 Hereditary and idiopathic neuropathy, unspecified: Secondary | ICD-10-CM

## 2016-03-18 DIAGNOSIS — M792 Neuralgia and neuritis, unspecified: Secondary | ICD-10-CM

## 2016-03-18 DIAGNOSIS — M5481 Occipital neuralgia: Secondary | ICD-10-CM

## 2016-03-18 DIAGNOSIS — M5416 Radiculopathy, lumbar region: Secondary | ICD-10-CM

## 2016-03-18 DIAGNOSIS — M791 Myalgia: Secondary | ICD-10-CM | POA: Diagnosis not present

## 2016-03-18 DIAGNOSIS — M79605 Pain in left leg: Secondary | ICD-10-CM | POA: Diagnosis present

## 2016-03-18 DIAGNOSIS — M47817 Spondylosis without myelopathy or radiculopathy, lumbosacral region: Secondary | ICD-10-CM | POA: Diagnosis not present

## 2016-03-18 DIAGNOSIS — G90523 Complex regional pain syndrome I of lower limb, bilateral: Secondary | ICD-10-CM | POA: Diagnosis not present

## 2016-03-18 DIAGNOSIS — M961 Postlaminectomy syndrome, not elsewhere classified: Secondary | ICD-10-CM

## 2016-03-18 DIAGNOSIS — M47816 Spondylosis without myelopathy or radiculopathy, lumbar region: Secondary | ICD-10-CM

## 2016-03-18 DIAGNOSIS — M79604 Pain in right leg: Secondary | ICD-10-CM | POA: Diagnosis present

## 2016-03-18 DIAGNOSIS — M170 Bilateral primary osteoarthritis of knee: Secondary | ICD-10-CM

## 2016-03-18 MED ORDER — DULOXETINE HCL 20 MG PO CPEP: ORAL_CAPSULE | ORAL | Status: DC

## 2016-03-18 MED ORDER — OXYCODONE HCL 5 MG PO TABS: ORAL_TABLET | ORAL | Status: DC

## 2016-03-18 NOTE — Progress Notes (Signed)
Patient here for medication management Safety precautions to be maintained throughout the outpatient stay will include: orient to surroundings, keep bed in low position, maintain call bell within reach at all times, provide assistance with transfer out of bed and ambulation.  

## 2016-03-18 NOTE — Patient Instructions (Addendum)
PLAN   Continue present medication oxycodone and Cymbalta as discussed   ALSO take vitamin B complex as discussed  Knee injections to be performed at time of return appointment  F/U PCP Dr. Jeananne Rama for evaliation of  BP and general medical condition and for further evaluation of condition status post hospitalization as previously discussed  F/U surgical evaluation. Follow-up surgical evaluation [facility] as discussed  F/U GI evaluation status post hospitalization for GI condition as previously discussed   F/U neurological evaluation. May consider pending follow-up evaluations  May consider radiofrequency rhizolysis or intraspinal procedures pending response to present treatment and F/U evaluation   Patient to call [facility] Management Center should patient have concerns prior to scheduled return appointmentKnee Injection A knee injection is a procedure to get medicine into your knee joint. Your health care provider puts a needle into the joint and injects medicine with an attached syringe. The injected medicine may relieve the pain, swelling, and stiffness of arthritis. The injected medicine may also help to lubricate and cushion your knee joint. You may need more than one injection. LET [facility] CARE PROVIDER KNOW ABOUT:  Any allergies you have.  All medicines you are taking, including vitamins, herbs, eye drops, creams, and over-the-counter medicines.  Previous problems you or members of your family have had with the use of anesthetics.  Any blood disorders you have.  Previous surgeries you have had.  Any medical conditions you may have. RISKS AND COMPLICATIONS Generally, this is a safe procedure. However, problems may occur, including:  Infection.  Bleeding.  Worsening symptoms.  Damage to the area around your knee.  Allergic reaction to any of the medicines.  Skin reactions from repeated injections. BEFORE THE PROCEDURE  Ask your health care provider about changing or  stopping your regular medicines. This is especially important if you are taking diabetes medicines or blood thinners.  Plan to have someone take you home after the procedure. PROCEDURE  You will sit or lie down in a position for your knee to be treated.  The skin over your kneecap will be cleaned with a germ-killing solution (antiseptic).  You will be given a medicine that numbs the area (local anesthetic). You may feel some stinging.  After your knee becomes numb, you will have a second injection. This is the medicine. This needle is carefully placed between your kneecap and your knee. The medicine is injected into the joint space.  At the end of the procedure, the needle will be removed.  A bandage (dressing) may be placed over the injection site. The procedure may vary among health care providers and hospitals. AFTER THE PROCEDURE  You may have to move your knee through its full range of motion. This helps to get all of the medicine into your joint space.  Your blood pressure, heart rate, breathing rate, and blood oxygen level will be monitored often until the medicines you were given have worn off.  You will be watched to make sure that you do not have a reaction to the injected medicine.   This information is not intended to replace advice given to you by your health care provider. Make sure you discuss any questions you have with your health care provider.   Document Released: 12/27/2006 Document Revised: 10/26/2014 Document Reviewed: 08/15/2014 Elsevier Interactive Patient Education 2016 [place]  What are the risk, side effects and possible complications? Generally speaking, most procedures are safe.  However, with any procedure there are risks, side  effects, and the possibility of complications.  The risks and complications are dependent upon the sites that are lesioned, or the type of nerve block to be performed.  The closer the  procedure is to the spine, the more serious the risks are.  Great care is taken when placing the radio frequency needles, block needles or lesioning probes, but sometimes complications can occur.  Infection: Any time there is an injection through the skin, there is a risk of infection.  This is why sterile conditions are used for these blocks.  There are four possible types of infection.  Localized skin infection.  Central Nervous System Infection-This can be in the form of Meningitis, which can be deadly.  Epidural Infections-This can be in the form of an epidural abscess, which can cause pressure inside of the spine, causing compression of the spinal cord with subsequent paralysis. This would require an emergency surgery to decompress, and there are no guarantees that the patient would recover from the paralysis.  Discitis-This is an infection of the intervertebral discs.  It occurs in about 1% of discography procedures.  It is difficult to treat and it may lead to surgery.        2. Pain: the needles have to go through skin and soft tissues, will cause soreness.       3. Damage to internal structures:  The nerves to be lesioned may be near blood vessels or    other nerves which can be potentially damaged.       4. Bleeding: Bleeding is more common if the patient is taking blood thinners such as  aspirin, Coumadin, Ticiid, Plavix, etc., or if he/she have some genetic predisposition  such as hemophilia. Bleeding into the spinal canal can cause compression of the spinal  cord with subsequent paralysis.  This would require an emergency surgery to  decompress and there are no guarantees that the patient would recover from the  paralysis.       5. Pneumothorax:  Puncturing of a lung is a possibility, every time a needle is introduced in  the area of the chest or upper back.  Pneumothorax refers to free air around the  collapsed lung(s), inside of the thoracic cavity (chest cavity).  Another two possible   complications related to a similar event would include: Hemothorax and Chylothorax.   These are variations of the Pneumothorax, where instead of air around the collapsed  lung(s), you may have blood or chyle, respectively.       6. Spinal headaches: They may occur with any procedures in the area of the spine.       7. Persistent CSF (Cerebro-Spinal Fluid) leakage: This is a rare problem, but may occur  with prolonged intrathecal or epidural catheters either due to the formation of a fistulous  track or a dural tear.       8. Nerve damage: By working so close to the spinal cord, there is always a possibility of  nerve damage, which could be as serious as a permanent spinal cord injury with  paralysis.       9. Death:  Although rare, severe deadly allergic reactions known as "Anaphylactic  reaction" can occur to any of the medications used.      10. Worsening of the symptoms:  We can always make thing worse.  What are the chances of something like this happening? Chances of any of this occuring are extremely low.  By statistics, you have more of a chance  of getting killed in a motor vehicle accident: while driving to the hospital than any of the above occurring .  Nevertheless, you should be aware that they are possibilities.  In general, it is similar to taking a shower.  Everybody knows that you can slip, hit your head and get killed.  Does that mean that you should not shower again?  Nevertheless always keep in mind that statistics do not mean anything if you happen to be on the wrong side of them.  Even if a procedure has a 1 (one) in a 1,000,000 (million) chance of going wrong, it you happen to be that one..Also, keep in mind that by statistics, you have more of a chance of having something go wrong when taking medications.  Who should not have this procedure? If you are on a blood thinning medication (e.g. Coumadin, Plavix, see list of "Blood Thinners"), or if you have an active infection going on,  you should not have the procedure.  If you are taking any blood thinners, please inform your physician.  How should I prepare for this procedure?  Do not eat or drink anything at least six hours prior to the procedure.  Bring a driver with you .  It cannot be a taxi.  Come accompanied by an adult that can drive you back, and that is strong enough to help you if your legs get weak or numb from the local anesthetic.  Take all of your medicines the morning of the procedure with just enough water to swallow them.  If you have diabetes, make sure that you are scheduled to have your procedure done first thing in the morning, whenever possible.  If you have diabetes, take only half of your insulin dose and notify our nurse that you have done so as soon as you arrive at the clinic.  If you are diabetic, but only take blood sugar pills (oral hypoglycemic), then do not take them on the morning of your procedure.  You may take them after you have had the procedure.  Do not take aspirin or any aspirin-containing medications, at least eleven (11) days prior to the procedure.  They may prolong bleeding.  Wear loose fitting clothing that may be easy to take off and that you would not mind if it got stained with Betadine or blood.  Do not wear any jewelry or perfume  Remove any nail coloring.  It will interfere with some of our monitoring equipment.  NOTE: Remember that this is not meant to be interpreted as a complete list of all possible complications.  Unforeseen problems may occur.  BLOOD THINNERS The following drugs contain aspirin or other products, which can cause increased bleeding during surgery and should not be taken for 2 weeks prior to and 1 week after surgery.  If you should need take something for relief of minor pain, you may take acetaminophen which is found in Tylenol,m Datril, Anacin-3 and Panadol. It is not blood thinner. The products listed below are.  Do not take any of the  products listed below in addition to any listed on your instruction sheet.  A.P.C or A.P.C with Codeine Codeine Phosphate Capsules #3 Ibuprofen Ridaura  ABC compound Congesprin Imuran rimadil  Advil Cope Indocin Robaxisal  Alka-Seltzer Effervescent Pain Reliever and Antacid Coricidin or Coricidin-D  Indomethacin Rufen  Alka-Seltzer plus Cold Medicine Cosprin Ketoprofen S-A-C Tablets  Anacin Analgesic Tablets or Capsules Coumadin Korlgesic Salflex  Anacin Extra Strength Analgesic tablets or capsules CP-2 Tablets  Lanoril Salicylate  Anaprox Cuprimine Capsules Levenox Salocol  Anexsia-D Dalteparin Magan Salsalate  Anodynos Darvon compound Magnesium Salicylate Sine-off  Ansaid Dasin Capsules Magsal Sodium Salicylate  Anturane Depen Capsules Marnal Soma  APF Arthritis pain formula Dewitt's Pills Measurin Stanback  Argesic Dia-Gesic Meclofenamic Sulfinpyrazone  Arthritis Bayer Timed Release Aspirin Diclofenac Meclomen Sulindac  Arthritis pain formula Anacin Dicumarol Medipren Supac  Analgesic (Safety coated) Arthralgen Diffunasal Mefanamic Suprofen  Arthritis Strength Bufferin Dihydrocodeine Mepro Compound Suprol  Arthropan liquid Dopirydamole Methcarbomol with Aspirin Synalgos  ASA tablets/Enseals Disalcid Micrainin Tagament  Ascriptin Doan's Midol Talwin  Ascriptin A/D Dolene Mobidin Tanderil  Ascriptin Extra Strength Dolobid Moblgesic Ticlid  Ascriptin with Codeine Doloprin or Doloprin with Codeine Momentum Tolectin  Asperbuf Duoprin Mono-gesic Trendar  Aspergum Duradyne Motrin or Motrin IB Triminicin  Aspirin plain, buffered or enteric coated Durasal Myochrisine Trigesic  Aspirin Suppositories Easprin Nalfon Trillsate  Aspirin with Codeine Ecotrin Regular or Extra Strength Naprosyn Uracel  Atromid-S Efficin Naproxen Ursinus  Auranofin Capsules Elmiron Neocylate Vanquish  Axotal Emagrin Norgesic Verin  Azathioprine Empirin or Empirin with Codeine Normiflo Vitamin E  Azolid Emprazil  Nuprin Voltaren  Bayer Aspirin plain, buffered or children's or timed BC Tablets or powders Encaprin Orgaran Warfarin Sodium  Buff-a-Comp Enoxaparin Orudis Zorpin  Buff-a-Comp with Codeine Equegesic Os-Cal-Gesic   Buffaprin Excedrin plain, buffered or Extra Strength Oxalid   Bufferin Arthritis Strength Feldene Oxphenbutazone   Bufferin plain or Extra Strength Feldene Capsules Oxycodone with Aspirin   Bufferin with Codeine Fenoprofen Fenoprofen Pabalate or Pabalate-SF   Buffets II Flogesic Panagesic   Buffinol plain or Extra Strength Florinal or Florinal with Codeine Panwarfarin   Buf-Tabs Flurbiprofen Penicillamine   Butalbital Compound Four-way cold tablets Penicillin   Butazolidin Fragmin Pepto-Bismol   Carbenicillin Geminisyn Percodan   Carna Arthritis Reliever Geopen Persantine   Carprofen Gold's salt Persistin   Chloramphenicol Goody's Phenylbutazone   Chloromycetin Haltrain Piroxlcam   Clmetidine heparin Plaquenil   Cllnoril Hyco-pap Ponstel   Clofibrate Hydroxy chloroquine Propoxyphen         Before stopping any of these medications, be sure to consult the physician who ordered them.  Some, such as Coumadin (Warfarin) are ordered to prevent or treat serious conditions such as "deep thrombosis", "pumonary embolisms", and other heart problems.  The amount of time that you may need off of the medication may also vary with the medication and the reason for which you were taking it.  If you are taking any of these medications, please make sure you notify your pain physician before you undergo any procedures.

## 2016-03-18 NOTE — Progress Notes (Signed)
   Subjective:    Patient ID: Edward Schneider., male    DOB: [birth year], 60 y.o.   MRN: [id number]  HPI  The patient is a 60 year old gentleman who returns to pain management for further evaluation and treatment of pain involving the upper extremity and lower extremity regions. The patient has had significant improvement of pain involving the extremities with a decrease in the burning sensation of the extremities. We will continue medications consisting of Cymbalta and oxycodone at this time. The patient denies any trauma change in events of daily living the call significant change in symptomatology. The patient will follow-up with primary care physician Dr. Jeananne Rama as discussed and will call pain management Center prior to scheduled return appointment should they be significant change in condition. All agreed to suggested treatment plan  Review of Systems     Objective:   Physical Exam  There was mild tenderness of the splenius capitis and occipitalis musculature regions palpation which be produced pain of mild degree with mild tenderness over the cervical and thoracic paraspinal musculature region. There was no crepitus of the thoracic region noted. Palpation of the acromioclavicular and glenohumeral joint regions reproduces minimal discomfort as well. Patient appeared to be with bilaterally equal grip strength and Tinel and Phalen's maneuver were without increased pain of significant degree. Palpation over the region of the lumbar paraspinal musculature region lumbar facet region was attends to palpation of moderate degree with lateral bending rotation extension and palpation of the lumbar facets reproducing moderate discomfort. Straight leg raise was tolerates approximately 30 without an increase of pain with dorsiflexion noted. EHL strength appeared to be decreased. There was negative clonus negative Homans. No sensory deficit or dermatomal distribution detected. Palpation of the PSIS and PII S  region reproduced minimal discomfort. There was mild tenderness along the greater trochanteric region iliotibial band region. Abdomen was nontender with no costovertebral tenderness noted.       Assessment & Plan:     Complex regional pain syndrome of the lower extremities (neuropathy of lower extremities)  Lumbar radiculopathy  Degenerative disc disease lumbar spine Multilevel degenerative changes lumbar spine with disc protrusion, facet arthropathy, foraminal narrowing, status post lumbar surgery.     PLAN   Continue present medication oxycodone and Cymbalta as discussed   ALSO take vitamin B complex as discussed  Knee injections to be performed at time of return appointment  F/U PCP Dr. Jeananne Rama for evaliation of  BP and general medical condition and for further evaluation of condition status post hospitalization as previously discussed  F/U surgical evaluation. Follow-up surgical evaluation [facility] as discussed  F/U GI evaluation status post hospitalization for GI condition as previously discussed   F/U neurological evaluation. May consider pending follow-up evaluations  May consider radiofrequency rhizolysis or intraspinal procedures pending response to present treatment and F/U evaluation   Patient to call Pain Management Center should patient have concerns prior to scheduled return appointment

## 2016-03-24 ENCOUNTER — Ambulatory Visit: Payer: Medicare Other | Admitting: Family Medicine

## 2016-03-25 ENCOUNTER — Ambulatory Visit (INDEPENDENT_AMBULATORY_CARE_PROVIDER_SITE_OTHER): Payer: Medicare Other | Admitting: Family Medicine

## 2016-03-25 ENCOUNTER — Encounter: Payer: Self-pay | Admitting: Family Medicine

## 2016-03-25 VITALS — BP 128/78 | HR 61 | Temp 97.6°F | Ht 67.6 in | Wt 220.0 lb

## 2016-03-25 DIAGNOSIS — J449 Chronic obstructive pulmonary disease, unspecified: Secondary | ICD-10-CM

## 2016-03-25 DIAGNOSIS — Z91038 Other insect allergy status: Secondary | ICD-10-CM | POA: Diagnosis not present

## 2016-03-25 DIAGNOSIS — Z0000 Encounter for general adult medical examination without abnormal findings: Secondary | ICD-10-CM | POA: Diagnosis not present

## 2016-03-25 DIAGNOSIS — K8689 Other specified diseases of pancreas: Secondary | ICD-10-CM | POA: Diagnosis not present

## 2016-03-25 DIAGNOSIS — M5136 Other intervertebral disc degeneration, lumbar region: Secondary | ICD-10-CM | POA: Diagnosis not present

## 2016-03-25 DIAGNOSIS — E7800 Pure hypercholesterolemia, unspecified: Secondary | ICD-10-CM | POA: Diagnosis not present

## 2016-03-25 DIAGNOSIS — Z91030 Bee allergy status: Secondary | ICD-10-CM

## 2016-03-25 DIAGNOSIS — I6529 Occlusion and stenosis of unspecified carotid artery: Secondary | ICD-10-CM | POA: Diagnosis not present

## 2016-03-25 DIAGNOSIS — I700 Atherosclerosis of aorta: Secondary | ICD-10-CM | POA: Diagnosis not present

## 2016-03-25 LAB — URINALYSIS, ROUTINE W REFLEX MICROSCOPIC
Bilirubin, UA: NEGATIVE
Glucose, UA: NEGATIVE
NITRITE UA: NEGATIVE
Protein, UA: NEGATIVE
RBC UA: NEGATIVE
Urobilinogen, Ur: 0.2 mg/dL (ref 0.2–1.0)
pH, UA: 5 (ref 5.0–7.5)

## 2016-03-25 LAB — MICROSCOPIC EXAMINATION: Epithelial Cells (non renal): NONE SEEN /hpf (ref 0–10)

## 2016-03-25 NOTE — Progress Notes (Signed)
BP 128/78 mmHg  Pulse 61  Temp(Src) 97.6 F (36.4 C)  Ht 5' 7.6" (1.717 m)  Wt 220 lb (99.791 kg)  BMI 33.85 kg/m2  SpO2 97%   Subjective:    Patient ID: Edward Schneider., male    DOB: [birth year], 60 y.o.   MRN: [id number]  HPI: Edward Schneider. is a 60 y.o. male  Chief Complaint  Patient presents with  . Annual Exam  Patient all in all doing bad. Having a great deal of pain working with pain clinic on management. Discussed depression and nerves with patient not going to hurt himself and is taking medications without problems. Patient's bowels are still given him a fit with marked diarrhea and abdominal pain is working with GI. Just had a colonoscopy last month. GU prostate exam reported as normal from that exam.   Patient also due to mobility concerns with limited ability to get up and move due to chronic back pain is interested in a lift chair. Reviewed patient's standing musculoskeletal ability and a lift chair as indicated.  Relevant past medical, surgical, family and social history reviewed and updated as indicated. Interim medical history since our last visit reviewed. Allergies and medications reviewed and updated.   Other than noted above in per patient's medical problems Review of Systems  Constitutional: Negative.   HENT: Negative.   Eyes: Negative.   Respiratory: Negative.   Cardiovascular: Negative.   Gastrointestinal: Negative.   Endocrine: Negative.   Genitourinary: Negative.   Musculoskeletal: Negative.   Skin: Negative.   Allergic/Immunologic: Negative.   Neurological: Negative.   Hematological: Negative.   Psychiatric/Behavioral: Negative.     Per HPI unless specifically indicated above     Objective:    BP 128/78 mmHg  Pulse 61  Temp(Src) 97.6 F (36.4 C)  Ht 5' 7.6" (1.717 m)  Wt 220 lb (99.791 kg)  BMI 33.85 kg/m2  SpO2 97%  Wt Readings from Last 3 Encounters:  03/25/16 220 lb (99.791 kg)  03/18/16 214 lb (97.07 kg)  03/06/16 221 lb  (100.245 kg)    Physical Exam  Constitutional: He is oriented to person, place, and time. He appears well-developed and well-nourished.  HENT:  Head: Normocephalic and atraumatic.  Right Ear: External ear normal.  Left Ear: External ear normal.  Eyes: Conjunctivae and EOM are normal. Pupils are equal, round, and reactive to light.  Neck: Normal range of motion. Neck supple.  Cardiovascular: Normal rate, regular rhythm, normal heart sounds and intact distal pulses.   Pulmonary/Chest: Effort normal and breath sounds normal.  Abdominal: Soft. Bowel sounds are normal. There is no splenomegaly or hepatomegaly.  Genitourinary: Rectum normal, prostate normal and penis normal.  Musculoskeletal: Normal range of motion.  Patient early in the day is able to use his arms to push on arm rests to get up from a chair but as the day goes on is unable to use his legs because of swelling and arm weakness.  Neurological: He is alert and oriented to person, place, and time. He has normal reflexes.  Skin: No rash noted. No erythema.  Psychiatric: He has a normal mood and affect. His behavior is normal. Judgment and thought content normal.    Results for orders placed or performed in visit on 01/29/16  Clostridium Difficile by PCR  Result Value Ref Range   Toxigenic C Difficile by pcr Positive (A) Negative      Assessment & Plan:   Problem List Items Addressed This Visit  Respiratory   COPD (chronic obstructive pulmonary disease) (HCC)    The current medical regimen is effective;  continue present plan and medications.         Musculoskeletal and Integument   DDD (degenerative disc disease), lumbar    Discuss back pain and limited mobility and will fill out paperwork for lift chair as becomes available.        Other   Allergy to bee sting    The patient had been to bee stings needs refill on his EpiPen       Other Visit Diagnoses    Routine general medical examination at a health  care facility    -  Primary    Relevant Orders    CBC with Differential/Platelet    Comprehensive metabolic panel    Lipid Panel w/o Chol/HDL Ratio    PSA    TSH    Urinalysis, Routine w reflex microscopic (not at [facility])        Follow up plan: Return in about 6 months (around 09/24/2016) for bmp.

## 2016-03-25 NOTE — Addendum Note (Signed)
Addended by: Rowe Clack H on: 03/25/2016 11:18 AM   Modules accepted: Miquel Dunn

## 2016-03-25 NOTE — Assessment & Plan Note (Signed)
The current medical regimen is effective;  continue present plan and medications.  

## 2016-03-25 NOTE — Assessment & Plan Note (Signed)
Discuss back pain and limited mobility and will fill out paperwork for lift chair as becomes available.

## 2016-03-25 NOTE — Assessment & Plan Note (Signed)
The patient had been to bee stings needs refill on his EpiPen

## 2016-03-26 ENCOUNTER — Telehealth: Payer: Self-pay | Admitting: Family Medicine

## 2016-03-26 LAB — LIPID PANEL W/O CHOL/HDL RATIO
Cholesterol, Total: 200 mg/dL — ABNORMAL HIGH (ref 100–199)
HDL: 49 mg/dL (ref >39)
LDL Calculated: 126 mg/dL — ABNORMAL HIGH (ref 0–99)
TRIGLYCERIDES: 126 mg/dL (ref 0–149)
VLDL Cholesterol Cal: 25 mg/dL (ref 5–40)

## 2016-03-26 LAB — COMPREHENSIVE METABOLIC PANEL
A/G RATIO: 1.3 (ref 1.2–2.2)
ALBUMIN: 4.1 g/dL (ref 3.5–5.5)
ALK PHOS: 68 IU/L (ref 39–117)
ALT: 48 IU/L — ABNORMAL HIGH (ref 0–44)
AST: 34 IU/L (ref 0–40)
BUN / CREAT RATIO: 12 (ref 9–20)
BUN: 11 mg/dL (ref 6–24)
Bilirubin Total: 0.5 mg/dL (ref 0.0–1.2)
CALCIUM: 9.7 mg/dL (ref 8.7–10.2)
CO2: 22 mmol/L (ref 18–29)
Chloride: 98 mmol/L (ref 96–106)
Creatinine, Ser: 0.91 mg/dL (ref 0.76–1.27)
GFR calc Af Amer: 107 mL/min/{1.73_m2} (ref >59)
GFR, EST NON AFRICAN AMERICAN: 93 mL/min/{1.73_m2} (ref >59)
GLOBULIN, TOTAL: 3.2 g/dL (ref 1.5–4.5)
Glucose: 203 mg/dL — ABNORMAL HIGH (ref 65–99)
POTASSIUM: 4.6 mmol/L (ref 3.5–5.2)
SODIUM: 137 mmol/L (ref 134–144)
Total Protein: 7.3 g/dL (ref 6.0–8.5)

## 2016-03-26 LAB — CBC WITH DIFFERENTIAL/PLATELET
BASOS: 1 %
Basophils Absolute: 0.1 10*3/uL (ref 0.0–0.2)
EOS (ABSOLUTE): 0.3 10*3/uL (ref 0.0–0.4)
EOS: 3 %
HEMATOCRIT: 42.3 % (ref 37.5–51.0)
HEMOGLOBIN: 14 g/dL (ref 12.6–17.7)
Immature Grans (Abs): 0 10*3/uL (ref 0.0–0.1)
Immature Granulocytes: 0 %
LYMPHS ABS: 2.9 10*3/uL (ref 0.7–3.1)
Lymphs: 40 %
MCH: 28.5 pg (ref 26.6–33.0)
MCHC: 33.1 g/dL (ref 31.5–35.7)
MCV: 86 fL (ref 79–97)
MONOS ABS: 0.5 10*3/uL (ref 0.1–0.9)
Monocytes: 7 %
Neutrophils Absolute: 3.6 10*3/uL (ref 1.4–7.0)
Neutrophils: 49 %
Platelets: 224 10*3/uL (ref 150–379)
RBC: 4.91 x10E6/uL (ref 4.14–5.80)
RDW: 14.4 % (ref 12.3–15.4)
WBC: 7.3 10*3/uL (ref 3.4–10.8)

## 2016-03-26 LAB — PSA: PROSTATE SPECIFIC AG, SERUM: 0.4 ng/mL (ref 0.0–4.0)

## 2016-03-26 LAB — TSH: TSH: 3.26 u[IU]/mL (ref 0.450–4.500)

## 2016-03-26 NOTE — Telephone Encounter (Signed)
This is noted in his note from physical exam.  Will fill out on MAC's return

## 2016-03-26 NOTE — Telephone Encounter (Signed)
Pt called stated that Dr. Jeananne Rama needs to contact his insurance company in regards to the power wheelchair. Pt is going to bring the paperwork for this today. Thanks.

## 2016-04-14 ENCOUNTER — Other Ambulatory Visit: Payer: Self-pay | Admitting: Pain Medicine

## 2016-04-15 ENCOUNTER — Encounter: Payer: Self-pay | Admitting: Pain Medicine

## 2016-04-15 ENCOUNTER — Ambulatory Visit: Payer: Medicare Other | Attending: Pain Medicine | Admitting: Pain Medicine

## 2016-04-15 ENCOUNTER — Other Ambulatory Visit: Payer: Self-pay | Admitting: Family Medicine

## 2016-04-15 VITALS — BP 132/79 | HR 54 | Temp 97.9°F | Resp 16 | Ht 68.0 in | Wt 214.0 lb

## 2016-04-15 DIAGNOSIS — G609 Hereditary and idiopathic neuropathy, unspecified: Secondary | ICD-10-CM

## 2016-04-15 DIAGNOSIS — M5030 Other cervical disc degeneration, unspecified cervical region: Secondary | ICD-10-CM

## 2016-04-15 DIAGNOSIS — M5416 Radiculopathy, lumbar region: Secondary | ICD-10-CM

## 2016-04-15 DIAGNOSIS — M5481 Occipital neuralgia: Secondary | ICD-10-CM

## 2016-04-15 DIAGNOSIS — M47816 Spondylosis without myelopathy or radiculopathy, lumbar region: Secondary | ICD-10-CM

## 2016-04-15 DIAGNOSIS — M25562 Pain in left knee: Secondary | ICD-10-CM | POA: Diagnosis present

## 2016-04-15 DIAGNOSIS — M961 Postlaminectomy syndrome, not elsewhere classified: Secondary | ICD-10-CM

## 2016-04-15 DIAGNOSIS — M179 Osteoarthritis of knee, unspecified: Secondary | ICD-10-CM | POA: Diagnosis not present

## 2016-04-15 DIAGNOSIS — M170 Bilateral primary osteoarthritis of knee: Secondary | ICD-10-CM | POA: Insufficient documentation

## 2016-04-15 DIAGNOSIS — M47812 Spondylosis without myelopathy or radiculopathy, cervical region: Secondary | ICD-10-CM

## 2016-04-15 DIAGNOSIS — M5136 Other intervertebral disc degeneration, lumbar region: Secondary | ICD-10-CM

## 2016-04-15 DIAGNOSIS — M25561 Pain in right knee: Secondary | ICD-10-CM | POA: Diagnosis present

## 2016-04-15 DIAGNOSIS — M792 Neuralgia and neuritis, unspecified: Secondary | ICD-10-CM

## 2016-04-15 DIAGNOSIS — Z9889 Other specified postprocedural states: Secondary | ICD-10-CM

## 2016-04-15 DIAGNOSIS — M533 Sacrococcygeal disorders, not elsewhere classified: Secondary | ICD-10-CM

## 2016-04-15 DIAGNOSIS — Z981 Arthrodesis status: Secondary | ICD-10-CM

## 2016-04-15 MED ORDER — CEFUROXIME AXETIL 250 MG PO TABS: 250.0000 mg | ORAL_TABLET | Freq: Two times a day (BID) | ORAL | Status: DC

## 2016-04-15 MED ORDER — OXYCODONE HCL 5 MG PO TABS: ORAL_TABLET | ORAL | Status: DC

## 2016-04-15 MED ORDER — BUPIVACAINE HCL (PF) 0.25 % IJ SOLN
30.0000 mL | Freq: Once | INTRAMUSCULAR | Status: AC
  Administered 2016-04-15: 30 mL
  Filled 2016-04-15: qty 30

## 2016-04-15 MED ORDER — SODIUM CHLORIDE 0.9% FLUSH
20.0000 mL | Freq: Once | INTRAVENOUS | Status: AC
  Administered 2016-04-15: 20 mL

## 2016-04-15 MED ORDER — SODIUM CHLORIDE 0.9 % IJ SOLN
INTRAMUSCULAR | Status: AC
  Filled 2016-04-15: qty 10

## 2016-04-15 MED ORDER — TRIAMCINOLONE ACETONIDE 40 MG/ML IJ SUSP
40.0000 mg | Freq: Once | INTRAMUSCULAR | Status: AC
  Administered 2016-04-15: 40 mg
  Filled 2016-04-15: qty 1

## 2016-04-15 MED ORDER — DULOXETINE HCL 20 MG PO CPEP: ORAL_CAPSULE | ORAL | Status: DC

## 2016-04-15 NOTE — Progress Notes (Signed)
Safety precautions to be maintained throughout the outpatient stay will include: orient to surroundings, keep bed in low position, maintain call bell within reach at all times, provide assistance with transfer out of bed and ambulation.  

## 2016-04-15 NOTE — Patient Instructions (Addendum)
PLAN   Continue present medication oxycodone and Cymbalta as discussed   ALSO take vitamin B complex as discussed Please obtain Ceftin antibiotic today and begin taking Ceftin antibiotic today as prescribed  F/U PCP Dr. Jeananne Rama for evaliation of  BP and general medical condition   F/U surgical evaluation. Follow-up surgical evaluation [facility] as discussed  F/U GI evaluation status post hospitalization for GI condition as previously discussed   F/U neurological evaluation. May consider pending follow-up evaluations  May consider radiofrequency rhizolysis or intraspinal procedures pending response to present treatment and F/U evaluation   Patient to call [facility] should patient have concerns prior to scheduled return appointmentKnee Injection A knee injection is a procedure to get medicine into your knee joint. Your health care provider puts a needle into the joint and injects medicine with an attached syringe. The injected medicine may relieve the pain, swelling, and stiffness of arthritis. The injected medicine may also help to lubricate and cushion your knee joint. You may need more than one injection. LET [facility] CARE PROVIDER KNOW ABOUT:  Any allergies you have.  All medicines you are taking, including vitamins, herbs, eye drops, creams, and over-the-counter medicines.  Previous problems you or members of your family have had with the use of anesthetics.  Any blood disorders you have.  Previous surgeries you have had.  Any medical conditions you may have. RISKS AND COMPLICATIONS Generally, this is a safe procedure. However, problems may occur, including:  Infection.  Bleeding.  Worsening symptoms.  Damage to the area around your knee.  Allergic reaction to any of the medicines.  Skin reactions from repeated injections. BEFORE THE PROCEDURE  Ask your health care provider about changing or stopping your regular medicines. This is especially important if  you are taking diabetes medicines or blood thinners.  Plan to have someone take you home after the procedure. PROCEDURE  You will sit or lie down in a position for your knee to be treated.  The skin over your kneecap will be cleaned with a germ-killing solution (antiseptic).  You will be given a medicine that numbs the area (local anesthetic). You may feel some stinging.  After your knee becomes numb, you will have a second injection. This is the medicine. This needle is carefully placed between your kneecap and your knee. The medicine is injected into the joint space.  At the end of the procedure, the needle will be removed.  A bandage (dressing) may be placed over the injection site. The procedure may vary among health care providers and hospitals. AFTER THE PROCEDURE  You may have to move your knee through its full range of motion. This helps to get all of the medicine into your joint space.  Your blood pressure, heart rate, breathing rate, and blood oxygen level will be monitored often until the medicines you were given have worn off.  You will be watched to make sure that you do not have a reaction to the injected medicine.   This information is not intended to replace advice given to you by your health care provider. Make sure you discuss any questions you have with your health care provider.   Document Released: 12/27/2006 Document Revised: 10/26/2014 Document Reviewed: 08/15/2014 Elsevier Interactive Patient Education 2016 [employer] Pain Management Discharge Instructions  General Discharge Instructions :  If you need to reach your doctor call: Monday-Friday 8:00 am - 4:00 pm at [phone] or toll free [phone].  After clinic hours [phone] to have operator  reach doctor.  Bring all of your medication bottles to all your appointments in the pain clinic.  To cancel or reschedule your appointment with Pain Management please remember to call 24 hours in  advance to avoid a fee.  Refer to the educational materials which you have been given on: General Risks, I had my Procedure. Discharge Instructions, Post Sedation.  Post Procedure Instructions:  The drugs you were given will stay in your system until tomorrow, so for the next 24 hours you should not drive, make any legal decisions or drink any alcoholic beverages.  You may eat anything you prefer, but it is better to start with liquids then soups and crackers, and gradually work up to solid foods.  Please notify your doctor immediately if you have any unusual bleeding, trouble breathing or pain that is not related to your normal pain.  Depending on the type of procedure that was done, some parts of your body may feel week and/or numb.  This usually clears up by tonight or the next day.  Walk with the use of an assistive device or accompanied by an adult for the 24 hours.  You may use ice on the affected area for the first 24 hours.  Put ice in a Ziploc bag and cover with a towel and place against area 15 minutes on 15 minutes off.  You may switch to heat after 24 hours.

## 2016-04-15 NOTE — Progress Notes (Signed)
   Subjective:    Patient ID: Edward Schneider., male    DOB: [birth year], 60 y.o.   MRN: [id number]  HPI                  PROCEDURE:  LEFT AND RIGHT  KNEE INJECTIONS    The patient is a 60  -year-old gentleman who returns to [facility] for further evaluation and treatment of pain involving the region of the knees. Prior studies have revealed patient to be with significant degenerative joint disease of the knees.  There is concern regarding intra-articular abnormalities of the knee contributing to patient's symptomatology. We will proceed with interventional treatment consisting of knee injection in attempt to decrease severity of patient's symptoms, minimize progression of patient's symptoms, and avoid the need for more involved treatment. The risks, benefits, and expectations of the procedure have been explained to the patient who is with understanding and with agreement to proceed with interventional treatment as planned.      DESCRIPTION OF PROCEDURE:  LEFT KNEE INJECTION   The patient is in the  sitting positio with head of bed at 45 angle and the knee in the flexed  Position.  EKG, blood pressure, pulse, and pulse oximetry monitoring all in place. Betadine prep of proposed entry site is accomplished.  LEFT KNEE  INJECTION (LATERAL APPROACH)  Following identification of landmarks for knee injection, a 22-gauge needle was inserted inferior and lateral to the patella.  A total of 2 cc of 0.25% bupivacaine with Depo-Medrol was injected for left knee injection lateral approach.  LEFT KNEE INJECTION  (MEDIAL APPROACH)  Following identification of landmarks for knee injection, a 22-gauge needle was inserted inferior and medial to the patella. A total of 2 cc of 0.25% bupivacaine with Depo-Medrol was injected for left knee injection medial approach.   RIGHT KNEE INJECTIONS  The procedure was performed on the right side exactly as was performed on the left knee utilizing the  same technique and medication   The patient tolerated the procedure well   A total of 40 mg of Kenalog l was utilized for the procedure.    Plan  Continue present medications Cymbalta and oxycodone and antibiotics. Please obtain your antibiotic today and begin taking antibiotic today  F/U PCP Dr. Jeananne Rama for evaliation of  BP and general medical  condition.  F/U surgical evaluation as discussed  F/U neurological evaluation.  May consider radiofrequency rhizolysis or intraspinal procedures pending response to present treatment and F/U evaluation.  Patient to call [facility] Management Center should patient have concerns prior to scheduled return appointment.        Review of Systems     Objective:   Physical Exam        Assessment & Plan:

## 2016-04-16 ENCOUNTER — Telehealth: Payer: Self-pay | Admitting: *Deleted

## 2016-04-16 NOTE — Telephone Encounter (Signed)
No problems post procedure. 

## 2016-04-28 ENCOUNTER — Other Ambulatory Visit: Payer: Self-pay

## 2016-04-28 MED ORDER — DIPHENOXYLATE-ATROPINE 2.5-0.025 MG PO TABS: 1.0000 | ORAL_TABLET | Freq: Four times a day (QID) | ORAL | Status: DC | PRN

## 2016-05-13 ENCOUNTER — Other Ambulatory Visit: Payer: Self-pay | Admitting: Unknown Physician Specialty

## 2016-05-13 ENCOUNTER — Telehealth: Payer: Self-pay

## 2016-05-13 ENCOUNTER — Other Ambulatory Visit: Payer: Self-pay | Admitting: Family Medicine

## 2016-05-13 MED ORDER — TRAZODONE HCL 50 MG PO TABS: 50.0000 mg | ORAL_TABLET | Freq: Every evening | ORAL | 6 refills | Status: DC | PRN

## 2016-05-13 NOTE — Telephone Encounter (Signed)
Your patient.  Thanks 

## 2016-05-13 NOTE — Telephone Encounter (Signed)
Patient states that he needs a refill on Trazadone.

## 2016-05-14 ENCOUNTER — Ambulatory Visit: Payer: Medicare Other | Attending: Pain Medicine | Admitting: Pain Medicine

## 2016-05-14 ENCOUNTER — Encounter: Payer: Self-pay | Admitting: Pain Medicine

## 2016-05-14 VITALS — BP 137/82 | HR 57 | Temp 95.6°F | Ht 68.0 in | Wt 214.0 lb

## 2016-05-14 DIAGNOSIS — M79604 Pain in right leg: Secondary | ICD-10-CM | POA: Diagnosis present

## 2016-05-14 DIAGNOSIS — M533 Sacrococcygeal disorders, not elsewhere classified: Secondary | ICD-10-CM | POA: Diagnosis not present

## 2016-05-14 DIAGNOSIS — M5126 Other intervertebral disc displacement, lumbar region: Secondary | ICD-10-CM | POA: Diagnosis not present

## 2016-05-14 DIAGNOSIS — M5116 Intervertebral disc disorders with radiculopathy, lumbar region: Secondary | ICD-10-CM | POA: Insufficient documentation

## 2016-05-14 DIAGNOSIS — M170 Bilateral primary osteoarthritis of knee: Secondary | ICD-10-CM | POA: Insufficient documentation

## 2016-05-14 DIAGNOSIS — M961 Postlaminectomy syndrome, not elsewhere classified: Secondary | ICD-10-CM

## 2016-05-14 DIAGNOSIS — G90523 Complex regional pain syndrome I of lower limb, bilateral: Secondary | ICD-10-CM | POA: Insufficient documentation

## 2016-05-14 DIAGNOSIS — Z9889 Other specified postprocedural states: Secondary | ICD-10-CM | POA: Insufficient documentation

## 2016-05-14 DIAGNOSIS — M179 Osteoarthritis of knee, unspecified: Secondary | ICD-10-CM | POA: Diagnosis not present

## 2016-05-14 DIAGNOSIS — M5416 Radiculopathy, lumbar region: Secondary | ICD-10-CM | POA: Diagnosis not present

## 2016-05-14 DIAGNOSIS — M47896 Other spondylosis, lumbar region: Secondary | ICD-10-CM | POA: Diagnosis not present

## 2016-05-14 DIAGNOSIS — M5481 Occipital neuralgia: Secondary | ICD-10-CM

## 2016-05-14 DIAGNOSIS — M47817 Spondylosis without myelopathy or radiculopathy, lumbosacral region: Secondary | ICD-10-CM | POA: Diagnosis not present

## 2016-05-14 DIAGNOSIS — G609 Hereditary and idiopathic neuropathy, unspecified: Secondary | ICD-10-CM

## 2016-05-14 DIAGNOSIS — M79605 Pain in left leg: Secondary | ICD-10-CM | POA: Diagnosis present

## 2016-05-14 MED ORDER — DULOXETINE HCL 20 MG PO CPEP
ORAL_CAPSULE | ORAL | 0 refills | Status: DC
Start: 2016-05-14 — End: 2016-06-11

## 2016-05-14 MED ORDER — OXYCODONE HCL 5 MG PO TABS: ORAL_TABLET | ORAL | 0 refills | Status: DC

## 2016-05-14 NOTE — Patient Instructions (Signed)
PLAN   Continue present medication oxycodone and Cymbalta as discussed   ALSO take vitamin B complex as discussed  F/U PCP Dr. Jeananne Rama for evaliation of  BP and general medical condition and for further evaluation of condition status post hospitalization as previously discussed  F/U surgical evaluation. Follow-up surgical evaluation [facility] as discussed  F/U GI evaluation status post hospitalization for GI condition as previously discussed   F/U neurological evaluation. May consider pending follow-up evaluations  May consider radiofrequency rhizolysis or intraspinal procedures pending response to present treatment and F/U evaluation   Patient to call [facility] should patient have concerns prior to scheduled return appointment

## 2016-05-14 NOTE — Progress Notes (Signed)
     The patient is a 60 year old gentleman who returns to pain management for further evaluation and treatment of pain involving the extremities especially the lower extremities. The patient states that he has had significant improvement of his pain following injection of the knees and that he also has had benefit from the use of Cymbalta in terms of pain decreasing in the lower extremities with less burning sensation of the lower extremities. The patient states that he is able to obtain restful sleep and is doing rather well at this time. Patient denies any trauma change in events of daily living the call significant change in symptomatology patient is able to perform activities of daily living without any significant pain is well.    Physical examination  There was tenderness over the splenius capitis and occipitalis region of mild degree with mild tenderness of the acromioclavicular and glenohumeral joint regions. The patient was with bilaterally equal grip strength. The patient was with moderate difficulty performing drop test and was without increased pain with Tinel and Phalen's maneuver. The patient appeared to be with bilaterally equal grip strength. Palpation over the thoracic region was attends to palpation of muscle spasm without crepitus. Palpation over the lumbar region was with moderate tenderness to palpation with lateral bending rotation extension and palpation of the lumbar facets reproducing moderate discomfort. Straight leg raising was tolerated to 30 without increased pain with dorsiflexion noted. No definite sensory deficit or dermatomal distribution of the lower extremities noted. There was negative clonus negative Homans. Palpation over the PSIS and PII S region reproduced mild to moderate discomfort. There was no abdominal tenderness to palpation and no costovertebral tenderness noted    Assessment   Complex regional pain syndrome of the lower extremities (neuropathy of  lower extremities)  Lumbar radiculopathy  Degenerative disc disease lumbar spine Multilevel degenerative changes lumbar spine with disc protrusion, facet arthropathy, foraminal narrowing, status post lumbar surgery.  Degenerative joint disease of knees     PLAN   Continue present medication oxycodone and Cymbalta as discussed   ALSO take vitamin B complex as discussed  F/U PCP Dr. Jeananne Rama for evaliation of  BP and general medical condition and for further evaluation of condition status post hospitalization as previously discussed  F/U surgical evaluation. Follow-up surgical evaluation [facility] as discussed  F/U GI evaluation status post hospitalization for GI condition as previously discussed   F/U neurological evaluation. May consider pending follow-up evaluations  May consider radiofrequency rhizolysis or intraspinal procedures pending response to present treatment and F/U evaluation   Patient to call [facility] Management Center should patient have concerns prior to scheduled return appointment

## 2016-05-26 ENCOUNTER — Telehealth: Payer: Self-pay | Admitting: Pain Medicine

## 2016-05-26 NOTE — Telephone Encounter (Signed)
Patient called about a refill for Duloxetine, he did not get at last appt. Can this be called in ?

## 2016-05-27 NOTE — Telephone Encounter (Signed)
Patient notified per voicemail that a prescription for Cymbalta was e-scribed to [facility] on 05-14-16.

## 2016-06-11 ENCOUNTER — Ambulatory Visit: Payer: Medicare Other | Attending: Pain Medicine | Admitting: Pain Medicine

## 2016-06-11 ENCOUNTER — Encounter: Payer: Self-pay | Admitting: Pain Medicine

## 2016-06-11 VITALS — BP 128/66 | HR 63 | Temp 98.0°F | Resp 16 | Wt 217.0 lb

## 2016-06-11 DIAGNOSIS — M5481 Occipital neuralgia: Secondary | ICD-10-CM

## 2016-06-11 DIAGNOSIS — M179 Osteoarthritis of knee, unspecified: Secondary | ICD-10-CM | POA: Diagnosis not present

## 2016-06-11 DIAGNOSIS — M47896 Other spondylosis, lumbar region: Secondary | ICD-10-CM | POA: Insufficient documentation

## 2016-06-11 DIAGNOSIS — M170 Bilateral primary osteoarthritis of knee: Secondary | ICD-10-CM | POA: Diagnosis not present

## 2016-06-11 DIAGNOSIS — G609 Hereditary and idiopathic neuropathy, unspecified: Secondary | ICD-10-CM

## 2016-06-11 DIAGNOSIS — M961 Postlaminectomy syndrome, not elsewhere classified: Secondary | ICD-10-CM

## 2016-06-11 DIAGNOSIS — M5126 Other intervertebral disc displacement, lumbar region: Secondary | ICD-10-CM | POA: Insufficient documentation

## 2016-06-11 DIAGNOSIS — M533 Sacrococcygeal disorders, not elsewhere classified: Secondary | ICD-10-CM | POA: Diagnosis not present

## 2016-06-11 DIAGNOSIS — M5416 Radiculopathy, lumbar region: Secondary | ICD-10-CM | POA: Diagnosis not present

## 2016-06-11 DIAGNOSIS — M1288 Other specific arthropathies, not elsewhere classified, other specified site: Secondary | ICD-10-CM | POA: Insufficient documentation

## 2016-06-11 DIAGNOSIS — M79605 Pain in left leg: Secondary | ICD-10-CM | POA: Diagnosis present

## 2016-06-11 DIAGNOSIS — G90523 Complex regional pain syndrome I of lower limb, bilateral: Secondary | ICD-10-CM | POA: Diagnosis not present

## 2016-06-11 DIAGNOSIS — M5116 Intervertebral disc disorders with radiculopathy, lumbar region: Secondary | ICD-10-CM | POA: Insufficient documentation

## 2016-06-11 DIAGNOSIS — M79604 Pain in right leg: Secondary | ICD-10-CM | POA: Diagnosis present

## 2016-06-11 DIAGNOSIS — M47817 Spondylosis without myelopathy or radiculopathy, lumbosacral region: Secondary | ICD-10-CM | POA: Diagnosis not present

## 2016-06-11 MED ORDER — OXYCODONE HCL 5 MG PO TABS: ORAL_TABLET | ORAL | 0 refills | Status: DC

## 2016-06-11 MED ORDER — DULOXETINE HCL 20 MG PO CPEP
ORAL_CAPSULE | ORAL | 0 refills | Status: DC
Start: 2016-06-11 — End: 2016-06-11

## 2016-06-11 MED ORDER — DULOXETINE HCL 20 MG PO CPEP: ORAL_CAPSULE | ORAL | 0 refills | Status: DC

## 2016-06-11 NOTE — Progress Notes (Signed)
     The patient is a 60 year old gentleman who returns to pain management for further evaluation and treatment of pain involving the upper extremities and lower extremities. The patient is with burning stinging sensation of the lower extremities which is interfering with activities of daily living as well as ability to obtain restful sleep. The patient continues Cymbalta vitamin B complex and oxycodone and despite present treatment regimen patient has had severe burning stinging sensations of the lower extremities. We discussed patient's condition and we will consider patient for interventional treatment at time return appointment at which time we will proceed with lumbar sympathetic block. We will consider additional modifications of treatment regimen pending response to treatment and follow-up evaluation. All agreed to suggested treatment plan.      Physical examination  There was tenderness to palpation of the paraspinal musculature in the cervical region cervical facet region of minimal degree with minimal tenderness of the splenius capitis and occipitalis region. The patient appeared to be with bilaterally equal grip strength and Tinel and Phalen's maneuver were without increased pain of significant degree. Palpation of the acromioclavicular and glenohumeral joint regions reproduced pain of minimal degree and patient was able to perform drop test with moderate difficulty. Palpation over the thoracic region was attends to palpation without crepitus of the thoracic region noted. Palpation over the region of the lumbar paraspinal musculatures and lumbar facet region was associated with increased pain with lateral bending rotation extension and palpation of the lumbar facets facets reproducing moderate discomfort. There was moderate tenderness of the PSIS and PII S region as well as the greater trochanteric region iliotibial band region. No definite sensory deficit or dermatomal distribution detected.  There was tenderness to palpation of the lower extremities without evidence of deep definite allodynia. No definite sensory deficit dermatomal dystrophy detected. There was negative clonus negative Homans. Abdomen was nontender with no costovertebral tenderness noted      Assessment   Complex regional pain syndrome of the lower extremities (neuropathy of lower extremities)  Lumbar radiculopathy  Degenerative disc disease lumbar spine Multilevel degenerative changes lumbar spine with disc protrusion, facet arthropathy, foraminal narrowing, status post lumbar surgery.  Degenerative joint disease of knees      PLAN   Continue present medication oxycodone and Cymbalta as discussed Try taking 1 - 2 Cymbalta per day for now if tolerated  ALSO take vitamin B complex as we previously discussed  Lumbar sympathetic block to be performed at time of return appointment  F/U PCP Dr. Jeananne Rama for evaliation of  BP and general medical condition  F/U surgical evaluation. Follow-up surgical evaluation [facility] as discussed  F/U GI evaluation status post hospitalization for GI condition as previously discussed   F/U neurological evaluation. May consider pending follow-up evaluations  May consider radiofrequency rhizolysis or intraspinal procedures pending response to present treatment and F/U evaluation   Patient to call Pain Management Center should patient have concerns prior to scheduled return appointment

## 2016-06-11 NOTE — Patient Instructions (Addendum)
PLAN   Continue present medication oxycodone and Cymbalta as discussed Try taking 1 - 2 Cymbalta per day for now if tolerated  ALSO take vitamin B complex as we previously discussed  Lumbar sympathetic block to be performed at time of return appointment  F/U PCP Dr. Jeananne Rama for evaliation of  BP and general medical condition  F/U surgical evaluation. Follow-up surgical evaluation [facility] as discussed  F/U GI evaluation status post hospitalization for GI condition as previously discussed   F/U neurological evaluation. May consider pending follow-up evaluations  May consider radiofrequency rhizolysis or intraspinal procedures pending response to present treatment and F/U evaluation   Patient to call [facility] should patient have concerns prior to scheduled return appointmentGENERAL RISKS AND COMPLICATIONS  What are the risk, side effects and possible complications? Generally speaking, most procedures are safe.  However, with any procedure there are risks, side effects, and the possibility of complications.  The risks and complications are dependent upon the sites that are lesioned, or the type of nerve block to be performed.  The closer the procedure is to the spine, the more serious the risks are.  Great care is taken when placing the radio frequency needles, block needles or lesioning probes, but sometimes complications can occur. 1. Infection: Any time there is an injection through the skin, there is a risk of infection.  This is why sterile conditions are used for these blocks.  There are four possible types of infection. 1. Localized skin infection. 2. Central Nervous System Infection-This can be in the form of Meningitis, which can be deadly. 3. Epidural Infections-This can be in the form of an epidural abscess, which can cause pressure inside of the spine, causing compression of the spinal cord with subsequent paralysis. This would require an emergency surgery to decompress,  and there are no guarantees that the patient would recover from the paralysis. 4. Discitis-This is an infection of the intervertebral discs.  It occurs in about 1% of discography procedures.  It is difficult to treat and it may lead to surgery.        2. Pain: the needles have to go through skin and soft tissues, will cause soreness.       3. Damage to internal structures:  The nerves to be lesioned may be near blood vessels or    other nerves which can be potentially damaged.       4. Bleeding: Bleeding is more common if the patient is taking blood thinners such as  aspirin, Coumadin, Ticiid, Plavix, etc., or if he/she have some genetic predisposition  such as hemophilia. Bleeding into the spinal canal can cause compression of the spinal  cord with subsequent paralysis.  This would require an emergency surgery to  decompress and there are no guarantees that the patient would recover from the  paralysis.       5. Pneumothorax:  Puncturing of a lung is a possibility, every time a needle is introduced in  the area of the chest or upper back.  Pneumothorax refers to free air around the  collapsed lung(s), inside of the thoracic cavity (chest cavity).  Another two possible  complications related to a similar event would include: Hemothorax and Chylothorax.   These are variations of the Pneumothorax, where instead of air around the collapsed  lung(s), you may have blood or chyle, respectively.       6. Spinal headaches: They may occur with any procedures in the area of the spine.  7. Persistent CSF (Cerebro-Spinal Fluid) leakage: This is a rare problem, but may occur  with prolonged intrathecal or epidural catheters either due to the formation of a fistulous  track or a dural tear.       8. Nerve damage: By working so close to the spinal cord, there is always a possibility of  nerve damage, which could be as serious as a permanent spinal cord injury with  paralysis.       9. Death:  Although rare, severe  deadly allergic reactions known as "Anaphylactic  reaction" can occur to any of the medications used.      10. Worsening of the symptoms:  We can always make thing worse.  What are the chances of something like this happening? Chances of any of this occuring are extremely low.  By statistics, you have more of a chance of getting killed in a motor vehicle accident: while driving to the hospital than any of the above occurring .  Nevertheless, you should be aware that they are possibilities.  In general, it is similar to taking a shower.  Everybody knows that you can slip, hit your head and get killed.  Does that mean that you should not shower again?  Nevertheless always keep in mind that statistics do not mean anything if you happen to be on the wrong side of them.  Even if a procedure has a 1 (one) in a 1,000,000 (million) chance of going wrong, it you happen to be that one..Also, keep in mind that by statistics, you have more of a chance of having something go wrong when taking medications.  Who should not have this procedure? If you are on a blood thinning medication (e.g. Coumadin, Plavix, see list of "Blood Thinners"), or if you have an active infection going on, you should not have the procedure.  If you are taking any blood thinners, please inform your physician.  How should I prepare for this procedure?  Do not eat or drink anything at least six hours prior to the procedure.  Bring a driver with you .  It cannot be a taxi.  Come accompanied by an adult that can drive you back, and that is strong enough to help you if your legs get weak or numb from the local anesthetic.  Take all of your medicines the morning of the procedure with just enough water to swallow them.  If you have diabetes, make sure that you are scheduled to have your procedure done first thing in the morning, whenever possible.  If you have diabetes, take only half of your insulin dose and notify our nurse that you have  done so as soon as you arrive at the clinic.  If you are diabetic, but only take blood sugar pills (oral hypoglycemic), then do not take them on the morning of your procedure.  You may take them after you have had the procedure.  Do not take aspirin or any aspirin-containing medications, at least eleven (11) days prior to the procedure.  They may prolong bleeding.  Wear loose fitting clothing that may be easy to take off and that you would not mind if it got stained with Betadine or blood.  Do not wear any jewelry or perfume  Remove any nail coloring.  It will interfere with some of our monitoring equipment.  NOTE: Remember that this is not meant to be interpreted as a complete list of all possible complications.  Unforeseen problems may occur.  BLOOD THINNERS  The following drugs contain aspirin or other products, which can cause increased bleeding during surgery and should not be taken for 2 weeks prior to and 1 week after surgery.  If you should need take something for relief of minor pain, you may take acetaminophen which is found in Tylenol,m Datril, Anacin-3 and Panadol. It is not blood thinner. The products listed below are.  Do not take any of the products listed below in addition to any listed on your instruction sheet.  A.P.C or A.P.C with Codeine Codeine Phosphate Capsules #3 Ibuprofen Ridaura  ABC compound Congesprin Imuran rimadil  Advil Cope Indocin Robaxisal  Alka-Seltzer Effervescent Pain Reliever and Antacid Coricidin or Coricidin-D  Indomethacin Rufen  Alka-Seltzer plus Cold Medicine Cosprin Ketoprofen S-A-C Tablets  Anacin Analgesic Tablets or Capsules Coumadin Korlgesic Salflex  Anacin Extra Strength Analgesic tablets or capsules CP-2 Tablets Lanoril Salicylate  Anaprox Cuprimine Capsules Levenox Salocol  Anexsia-D Dalteparin Magan Salsalate  Anodynos Darvon compound Magnesium Salicylate Sine-off  Ansaid Dasin Capsules Magsal Sodium Salicylate  Anturane Depen Capsules  Marnal Soma  APF Arthritis pain formula Dewitt's Pills Measurin Stanback  Argesic Dia-Gesic Meclofenamic Sulfinpyrazone  Arthritis Bayer Timed Release Aspirin Diclofenac Meclomen Sulindac  Arthritis pain formula Anacin Dicumarol Medipren Supac  Analgesic (Safety coated) Arthralgen Diffunasal Mefanamic Suprofen  Arthritis Strength Bufferin Dihydrocodeine Mepro Compound Suprol  Arthropan liquid Dopirydamole Methcarbomol with Aspirin Synalgos  ASA tablets/Enseals Disalcid Micrainin Tagament  Ascriptin Doan's Midol Talwin  Ascriptin A/D Dolene Mobidin Tanderil  Ascriptin Extra Strength Dolobid Moblgesic Ticlid  Ascriptin with Codeine Doloprin or Doloprin with Codeine Momentum Tolectin  Asperbuf Duoprin Mono-gesic Trendar  Aspergum Duradyne Motrin or Motrin IB Triminicin  Aspirin plain, buffered or enteric coated Durasal Myochrisine Trigesic  Aspirin Suppositories Easprin Nalfon Trillsate  Aspirin with Codeine Ecotrin Regular or Extra Strength Naprosyn Uracel  Atromid-S Efficin Naproxen Ursinus  Auranofin Capsules Elmiron Neocylate Vanquish  Axotal Emagrin Norgesic Verin  Azathioprine Empirin or Empirin with Codeine Normiflo Vitamin E  Azolid Emprazil Nuprin Voltaren  Bayer Aspirin plain, buffered or children's or timed BC Tablets or powders Encaprin Orgaran Warfarin Sodium  Buff-a-Comp Enoxaparin Orudis Zorpin  Buff-a-Comp with Codeine Equegesic Os-Cal-Gesic   Buffaprin Excedrin plain, buffered or Extra Strength Oxalid   Bufferin Arthritis Strength Feldene Oxphenbutazone   Bufferin plain or Extra Strength Feldene Capsules Oxycodone with Aspirin   Bufferin with Codeine Fenoprofen Fenoprofen Pabalate or Pabalate-SF   Buffets II Flogesic Panagesic   Buffinol plain or Extra Strength Florinal or Florinal with Codeine Panwarfarin   Buf-Tabs Flurbiprofen Penicillamine   Butalbital Compound Four-way cold tablets Penicillin   Butazolidin Fragmin Pepto-Bismol   Carbenicillin Geminisyn Percodan    Carna Arthritis Reliever Geopen Persantine   Carprofen Gold's salt Persistin   Chloramphenicol Goody's Phenylbutazone   Chloromycetin Haltrain Piroxlcam   Clmetidine heparin Plaquenil   Cllnoril Hyco-pap Ponstel   Clofibrate Hydroxy chloroquine Propoxyphen         Before stopping any of these medications, be sure to consult the physician who ordered them.  Some, such as Coumadin (Warfarin) are ordered to prevent or treat serious conditions such as "deep thrombosis", "pumonary embolisms", and other heart problems.  The amount of time that you may need off of the medication may also vary with the medication and the reason for which you were taking it.  If you are taking any of these medications, please make sure you notify your pain physician before you undergo any procedures.         Lumbar Sympathetic Block  Patient Information  Description: The lumbar plexus is a group of nerves that are part of the sympathetic nervous system.  These nerves supply organs in the pelvis and legs.  Lumbar sympathetic blocks are utilized for the diagnosis and treatment of painful conditions in these areas.   The lumbar plexus is located on both sides of the aorta at approximately the level of the second lumbar vertebral body.  The block will be performed with you lying on your abdomen with a pillow underneath.  Using direct x-ray guidance,   The plexus will be located on both sides of the spine.  Numbing medicine will be used to deaden the skin prior to needle insertion.  In most cases, a small amount of sedation can be give by IV prior to the numbing medicine.  One or two small needles will be placed near the plexus and local anesthetic will be injected.  This may make your leg(s) feel warm.  The Entire block usually lasts about 15-25 minutes.  Conditions which may be treated by lumbar sympathetic block:   Reflex sympathetic dystrophy  Phantom limb pain  Peripheral neuropathy  Peripheral vascular  disease ( inadequate blood flow )  Cancer pain of pelvis, leg and kidney  Preparation for the injection:  1. Do note eat any solid food or diary products within 8 hours of your appointment. 2. You may drink clear liquids up to 3 hours before appointment.  Clear liquids include water, black coffee, juice or soda.  No milk or cream please. 3. You may take your regular medication, including pain medications, with a sip of water before you appointment.  Diabetics should hold regular insulin ( if taken separately ) and take 1/2 NPH dose the morning of the procedure .  Carry some sugar containing items with you to your appointment. 4. A driver must accompany you and be prepared to drive you home after your procedure. 5. Bring all your current medication with you. 6. An IV may be inserted and sedation may be given at the discretion of the physician.  7. A blood pressure cuff, EKG and other monitors will often be applied during the procedure.  Some patients may need to have extra oxygen administered for a short period. 8. You will be asked to provide medical information, including your allergies and medications, prior to the procedure.  We must know immediately if your taking blood thinners (like Coumadin/Warfarin) or if you are allergic to IV iodine contrast (dye).  We must know if you could possibly be pregnant.  Possible side-effects   Bleeding from needle site or deeper  Infection (rare, can require surgery)  Nerve injury (rare)  Numbness & tingling (temporary)  Collapsed lung (rare)  Spinal headache (a headache worse with upright posture)  Light-headedness (temporary)  Pain at injection site (several days)  Decreased blood pressure (temporary)  Weakness in legs (temporary)  Seizure or other drug reaction (rare)  Call if you experience:   Fever/chills associated with headache or increased back/ neck pain  Headache worsened by an upright position  New onset weakness or  numbness of an extremity below the injection site  Hives or difficulty breathing ( go to the emergency room)  Inflammation or drainage at the injections site(s)  New symptoms which are concerning to you  Please note:  If effective, we will often do a series of 2-3 injections spaced 3-6 weeks apart to maximally decrease your pain.  If initial series is effective, you may be a candidate  for a more permanent block of the lumbar sympathetic plexus.  If you have any questions please call [phone] [facility]

## 2016-06-11 NOTE — Progress Notes (Signed)
Safety precautions to be maintained throughout the outpatient stay will include: orient to surroundings, keep bed in low position, maintain call bell within reach at all times, provide assistance with transfer out of bed and ambulation.  

## 2016-06-15 ENCOUNTER — Encounter: Payer: Self-pay | Admitting: Pain Medicine

## 2016-06-15 ENCOUNTER — Ambulatory Visit: Payer: Medicare Other | Attending: Pain Medicine | Admitting: Pain Medicine

## 2016-06-15 VITALS — BP 127/82 | HR 57 | Temp 97.4°F | Resp 15 | Ht 68.0 in | Wt 217.0 lb

## 2016-06-15 DIAGNOSIS — M5136 Other intervertebral disc degeneration, lumbar region: Secondary | ICD-10-CM

## 2016-06-15 DIAGNOSIS — M79605 Pain in left leg: Secondary | ICD-10-CM | POA: Diagnosis not present

## 2016-06-15 DIAGNOSIS — I700 Atherosclerosis of aorta: Secondary | ICD-10-CM

## 2016-06-15 DIAGNOSIS — R200 Anesthesia of skin: Secondary | ICD-10-CM | POA: Diagnosis not present

## 2016-06-15 DIAGNOSIS — M79604 Pain in right leg: Secondary | ICD-10-CM | POA: Insufficient documentation

## 2016-06-15 DIAGNOSIS — M179 Osteoarthritis of knee, unspecified: Secondary | ICD-10-CM | POA: Diagnosis not present

## 2016-06-15 DIAGNOSIS — G609 Hereditary and idiopathic neuropathy, unspecified: Secondary | ICD-10-CM

## 2016-06-15 DIAGNOSIS — M5481 Occipital neuralgia: Secondary | ICD-10-CM

## 2016-06-15 DIAGNOSIS — M5030 Other cervical disc degeneration, unspecified cervical region: Secondary | ICD-10-CM

## 2016-06-15 DIAGNOSIS — M4690 Unspecified inflammatory spondylopathy, site unspecified: Secondary | ICD-10-CM

## 2016-06-15 DIAGNOSIS — M961 Postlaminectomy syndrome, not elsewhere classified: Secondary | ICD-10-CM

## 2016-06-15 DIAGNOSIS — M5416 Radiculopathy, lumbar region: Secondary | ICD-10-CM | POA: Diagnosis not present

## 2016-06-15 MED ORDER — CEFAZOLIN IN D5W 1 GM/50ML IV SOLN
1.0000 g | Freq: Once | INTRAVENOUS | Status: AC
  Administered 2016-06-15: 1 g via INTRAVENOUS

## 2016-06-15 MED ORDER — LIDOCAINE HCL (PF) 1 % IJ SOLN
10.0000 mL | Freq: Once | INTRAMUSCULAR | Status: AC
  Administered 2016-06-15: 10 mL via SUBCUTANEOUS
  Filled 2016-06-15: qty 10

## 2016-06-15 MED ORDER — LACTATED RINGERS IV SOLN
1000.0000 mL | INTRAVENOUS | Status: DC
  Administered 2016-06-15: 1000 mL via INTRAVENOUS

## 2016-06-15 MED ORDER — CEFUROXIME AXETIL 250 MG PO TABS: 250.0000 mg | ORAL_TABLET | Freq: Two times a day (BID) | ORAL | 0 refills | Status: DC

## 2016-06-15 MED ORDER — ORPHENADRINE CITRATE 30 MG/ML IJ SOLN
60.0000 mg | Freq: Once | INTRAMUSCULAR | Status: AC
  Administered 2016-06-15: 60 mg via INTRAMUSCULAR
  Filled 2016-06-15: qty 2

## 2016-06-15 MED ORDER — CEFAZOLIN SODIUM 1 G IJ SOLR
INTRAMUSCULAR | Status: AC
  Administered 2016-06-15: 09:00:00
  Filled 2016-06-15: qty 10

## 2016-06-15 MED ORDER — MIDAZOLAM HCL 5 MG/5ML IJ SOLN
5.0000 mg | Freq: Once | INTRAMUSCULAR | Status: AC
  Administered 2016-06-15: 4 mg via INTRAVENOUS
  Filled 2016-06-15: qty 5

## 2016-06-15 MED ORDER — FENTANYL CITRATE (PF) 100 MCG/2ML IJ SOLN
100.0000 ug | Freq: Once | INTRAMUSCULAR | Status: AC
  Administered 2016-06-15: 50 ug via INTRAVENOUS
  Filled 2016-06-15: qty 2

## 2016-06-15 MED ORDER — TRIAMCINOLONE ACETONIDE 40 MG/ML IJ SUSP
40.0000 mg | Freq: Once | INTRAMUSCULAR | Status: AC
  Administered 2016-06-15: 40 mg
  Filled 2016-06-15: qty 1

## 2016-06-15 MED ORDER — BUPIVACAINE HCL (PF) 0.25 % IJ SOLN
30.0000 mL | Freq: Once | INTRAMUSCULAR | Status: AC
  Administered 2016-06-15: 30 mL
  Filled 2016-06-15: qty 30

## 2016-06-15 NOTE — Progress Notes (Signed)
PROCEDURE PERFORMED: Lumbar sympathetic block.  HISTORY OF PRESENT ILLNESS: The patient is 60 y.o. male who returns to [place] for further evaluation and treatment of pain involving the lower extremities. The patient is with history of burning stinging sensations of the lower extremities with concern regarding component of patient's pain began due to neuropathy. There is also concern regarding underlying vascular involvement contributing to patient's symptomatology . The risks, benefits, and expectations of the procedure were discussed and explained to the patient who was understanding and wished to proceed with interventional treatment as planned.   DESCRIPTION OF PROCEDURE: Lumbar sympathetic block with IV Versed, IV fentanyl conscious sedation, EKG, blood pressure, pulse, capnography, and pulse oximetry monitoring. The procedure was performed with the patient in prone position under fluoroscopic guidance.   NEEDLE PLACEMENT AT L2, right side lumbar sympathetic block: With the patient in prone position and oblique orientation of 20 degrees, Betadine prep and local anesthetic skin wheal of 1.5% lidocaine plain was prepared at the proposed needle entry site. Under fluoroscopic guidance with 20 degrees oblique orientation, the 22 -gauge needle was inserted at the lateral border of the L2 vertebral body on the right side.   NEEDLE PLACEMENT AT L3, right side lumbar sympathetic block: With the patient in the prone position and oblique orientation of 20 degrees, Betadine prep and local anesthetic skin wheal of 1.5% lidocaine plain was prepared at the proposed needle entry site. Under fluoroscopic guidance with 20 degrees  oblique orientation, the 22 -gauge needle was inserted at the lateral border of the L3 vertebral body on the right side.   NEEDLE PLACEMENT AT L4, right side lumbar sympathetic block: With the patient in prone position and oblique orientation of 20 degrees, Betadine prep  and local anesthetic skin wheal of 1.5% lidocaine plain was prepared at the proposed needle entry site. Under fluoroscopic guidance with 20 degrees  oblique orientation, the 22 -gauge needle was inserted at the lateral border of the L4 vertebral body on the right side.    Following needle placement at the L2, L3 and L4 vertebral body levels on the right side, needle placement was then verified on lateral view with tip of the needle documented to be in the anterior third of the vertebral body of L2, L3 and L4 respectively. Following negative aspiration of each needle for heme and CSF, L2 vertebral body level needle was injected with 10 mL of 0.25% bupivacaine. L3 vertebral body level needle was injected with 10 mL of 0.25% bupivacaine. L4 vertebral body level was injected with 10 mL of 0.25% bupivacaine. Needles were removed. Please note temperature readings prior to lumbar sympathetic block were noted to be 90 degrees Fahrenheit and following completion of the lumbar sympathetic block temperature readings of the lower extremity were noted to be 91 degrees Fahrenheit. The patient tolerated the procedure well.  PLAN:   1. Medications: We will continue presently prescribed medications Cymbalta and oxycodone at this time. 2. The patient is to follow-up with primary care physician Dr. Jeananne Rama for further evaluation of blood pressure and general medical condition as discussed. 3. Surgical evaluation as discussed. 4. Neurological evaluation as discussed. 5. The patient may be candidate for radiofrequency procedures, implantation devices, and other treatment pending response to treatment and follow-up evaluation. 6. The patient has been advised to adhere to proper body mechanics. 7. The patient has been advised to call the [facility] prior to scheduled return appointment should there be significant change in condition or  have other concerns regarding condition prior to scheduled return appointment.    The patient was understanding and in agreement with suggested treatment plan.

## 2016-06-15 NOTE — Patient Instructions (Signed)
PLAN   Continue present medication oxycodone and Cymbalta as discussed Try taking 1 - 2 Cymbalta per day for now if tolerated as previously discussed ALSO take vitamin B complex as we previously discussed  F/U PCP Dr. Jeananne Rama for evaliation of  BP and general medical condition  F/U surgical evaluation. Follow-up surgical evaluation [facility] as discussed  F/U GI evaluation status post hospitalization for GI condition as previously discussed   F/U neurological evaluation. May consider pending follow-up evaluations  May consider radiofrequency rhizolysis or intraspinal procedures pending response to present treatment and F/U evaluation   Patient to call [facility] should patient have concerns prior to scheduled return appointment

## 2016-06-16 ENCOUNTER — Telehealth: Payer: Self-pay | Admitting: *Deleted

## 2016-06-16 NOTE — Telephone Encounter (Signed)
No problems post procedure. 

## 2016-06-25 ENCOUNTER — Telehealth: Payer: Self-pay | Admitting: Family Medicine

## 2016-06-25 ENCOUNTER — Telehealth: Payer: Self-pay | Admitting: *Deleted

## 2016-06-25 NOTE — Telephone Encounter (Signed)
Phone call Discussed with patient no way we would be managing his pain or pain medications he would need to follow Dr. Primus Bravo to [facility] or get another local doctor.

## 2016-06-25 NOTE — Telephone Encounter (Signed)
Pt called stated he needs to talk to Dr. Jeananne Rama regarding [facility]. Stated his pain clinic doctor has resigned. Patient would like to know if Dr. Jeananne Rama can start prescribing his medication again. Please call pt ASAP. Thanks.

## 2016-07-05 ENCOUNTER — Other Ambulatory Visit: Payer: Self-pay | Admitting: Pain Medicine

## 2016-07-06 ENCOUNTER — Other Ambulatory Visit: Payer: Self-pay | Admitting: Pain Medicine

## 2016-07-06 DIAGNOSIS — M533 Sacrococcygeal disorders, not elsewhere classified: Secondary | ICD-10-CM | POA: Diagnosis not present

## 2016-07-06 DIAGNOSIS — M5416 Radiculopathy, lumbar region: Secondary | ICD-10-CM | POA: Diagnosis not present

## 2016-07-06 DIAGNOSIS — M791 Myalgia: Secondary | ICD-10-CM | POA: Diagnosis not present

## 2016-07-06 DIAGNOSIS — M47817 Spondylosis without myelopathy or radiculopathy, lumbosacral region: Secondary | ICD-10-CM | POA: Diagnosis not present

## 2016-07-13 ENCOUNTER — Ambulatory Visit: Payer: Medicare Other | Admitting: Pain Medicine

## 2016-08-10 ENCOUNTER — Other Ambulatory Visit: Payer: Self-pay | Admitting: Pain Medicine

## 2016-08-11 DIAGNOSIS — M533 Sacrococcygeal disorders, not elsewhere classified: Secondary | ICD-10-CM | POA: Diagnosis not present

## 2016-08-11 DIAGNOSIS — M791 Myalgia: Secondary | ICD-10-CM | POA: Diagnosis not present

## 2016-08-11 DIAGNOSIS — M47817 Spondylosis without myelopathy or radiculopathy, lumbosacral region: Secondary | ICD-10-CM | POA: Diagnosis not present

## 2016-08-11 DIAGNOSIS — M5416 Radiculopathy, lumbar region: Secondary | ICD-10-CM | POA: Diagnosis not present

## 2016-08-31 DIAGNOSIS — M791 Myalgia: Secondary | ICD-10-CM | POA: Diagnosis not present

## 2016-08-31 DIAGNOSIS — M533 Sacrococcygeal disorders, not elsewhere classified: Secondary | ICD-10-CM | POA: Diagnosis not present

## 2016-08-31 DIAGNOSIS — M5416 Radiculopathy, lumbar region: Secondary | ICD-10-CM | POA: Diagnosis not present

## 2016-08-31 DIAGNOSIS — M47817 Spondylosis without myelopathy or radiculopathy, lumbosacral region: Secondary | ICD-10-CM | POA: Diagnosis not present

## 2016-09-24 ENCOUNTER — Encounter: Payer: Self-pay | Admitting: Family Medicine

## 2016-09-24 ENCOUNTER — Ambulatory Visit (INDEPENDENT_AMBULATORY_CARE_PROVIDER_SITE_OTHER): Payer: Medicare Other | Admitting: Family Medicine

## 2016-09-24 VITALS — BP 156/99 | HR 76 | Temp 98.0°F | Ht 68.6 in | Wt 229.4 lb

## 2016-09-24 DIAGNOSIS — L989 Disorder of the skin and subcutaneous tissue, unspecified: Secondary | ICD-10-CM

## 2016-09-24 DIAGNOSIS — M5442 Lumbago with sciatica, left side: Secondary | ICD-10-CM | POA: Diagnosis not present

## 2016-09-24 DIAGNOSIS — G4700 Insomnia, unspecified: Secondary | ICD-10-CM | POA: Diagnosis not present

## 2016-09-24 DIAGNOSIS — G609 Hereditary and idiopathic neuropathy, unspecified: Secondary | ICD-10-CM

## 2016-09-24 DIAGNOSIS — Z23 Encounter for immunization: Secondary | ICD-10-CM

## 2016-09-24 DIAGNOSIS — M5441 Lumbago with sciatica, right side: Secondary | ICD-10-CM | POA: Diagnosis not present

## 2016-09-24 DIAGNOSIS — G8929 Other chronic pain: Secondary | ICD-10-CM | POA: Diagnosis not present

## 2016-09-24 DIAGNOSIS — R21 Rash and other nonspecific skin eruption: Secondary | ICD-10-CM | POA: Insufficient documentation

## 2016-09-24 MED ORDER — EPINEPHRINE 0.3 MG/0.3ML IJ SOAJ: 0.3000 mg | Freq: Once | INTRAMUSCULAR | 12 refills | Status: AC

## 2016-09-24 MED ORDER — TRAZODONE HCL 50 MG PO TABS: 50.0000 mg | ORAL_TABLET | Freq: Every evening | ORAL | 6 refills | Status: DC | PRN

## 2016-09-24 MED ORDER — TRIAMCINOLONE ACETONIDE 0.1 % EX CREA: 1.0000 "application " | TOPICAL_CREAM | Freq: Two times a day (BID) | CUTANEOUS | 0 refills | Status: DC

## 2016-09-24 NOTE — Progress Notes (Addendum)
BP (!) 156/99 (BP Location: Left Arm, Patient Position: Sitting, Cuff Size: Large)   Pulse 76   Temp 98 F (36.7 C)   Ht 5' 8.6" (1.742 m)   Wt 229 lb 6.4 oz (104.1 kg)   SpO2 98%   BMI 34.27 kg/m    Subjective:    Patient ID: Edward Schneider., male    DOB: Jul 26, 1956, 60 y.o.   MRN: [id number]  HPI: Edward Brackens. is a 60 y.o. male  Chief Complaint  Patient presents with  . COPD  . Follow-up    6 month f/up  . Blisters    pt states he has blisters all over his body that started about 2 to 3 months ago  Patient follow-up has been for the last 2 months having marked blistering developing excoriations on to both arms ches  no known changes or irritation has put alcohol on it to no avail takes Benadryl to no avail. Discuss stopping those medications as they've obviously not done anything. No new contact irritants medications the patient's aware of laundry soaps etc.  Patient's GI system still have an upset stomach and diarrhea. Has had negative workup from GI who is released him. Continues to eat a limited diet which helps stabilize his stomach.  Pains being managed by Dr. crisp and stable at best.  Sleep taking trazodone 2 tablets at night seems to help him sleep has taken several hours and advance which is okay. Not having side effects from 2 tablets of dry mouth constipation or other visual issues.  Relevant past medical, surgical, family and social history reviewed and updated as indicated. Interim medical history since our last visit reviewed. Allergies and medications reviewed and updated.  Review of Systems  Constitutional: Negative.   Respiratory: Negative.   Cardiovascular: Negative.     Per HPI unless specifically indicated above     Objective:    BP (!) 156/99 (BP Location: Left Arm, Patient Position: Sitting, Cuff Size: Large)   Pulse 76   Temp 98 F (36.7 C)   Ht 5' 8.6" (1.742 m)   Wt 229 lb 6.4 oz (104.1 kg)   SpO2 98%   BMI 34.27 kg/m   Wt  Readings from Last 3 Encounters:  09/24/16 229 lb 6.4 oz (104.1 kg)  06/15/16 217 lb (98.4 kg)  06/11/16 217 lb (98.4 kg)    Physical Exam  Constitutional: He is oriented to person, place, and time. He appears well-developed and well-nourished. No distress.  HENT:  Head: Normocephalic and atraumatic.  Right Ear: Hearing normal.  Left Ear: Hearing normal.  Nose: Nose normal.  Eyes: Conjunctivae and lids are normal. Right eye exhibits no discharge. Left eye exhibits no discharge. No scleral icterus.  Cardiovascular: Normal rate, regular rhythm and normal heart sounds.   Pulmonary/Chest: Effort normal and breath sounds normal. No respiratory distress.  Musculoskeletal: Normal range of motion.  Neurological: He is alert and oriented to person, place, and time.  Skin: Skin is intact. No rash noted.  Psychiatric: He has a normal mood and affect. His speech is normal and behavior is normal. Judgment and thought content normal. Cognition and memory are normal.    Results for orders placed or performed in visit on 03/25/16  Microscopic Examination  Result Value Ref Range   WBC, UA 0-5 0 - 5 /hpf   RBC, UA 0-2 0 - 2 /hpf   Epithelial Cells (non renal) None seen 0 - 10 /hpf   Bacteria,  UA Few None seen/Few  CBC with Differential/Platelet  Result Value Ref Range   WBC 7.3 3.4 - 10.8 x10E3/uL   RBC 4.91 4.14 - 5.80 x10E6/uL   Hemoglobin 14.0 12.6 - 17.7 g/dL   Hematocrit 42.3 37.5 - 51.0 %   MCV 86 79 - 97 fL   MCH 28.5 26.6 - 33.0 pg   MCHC 33.1 31.5 - 35.7 g/dL   RDW 14.4 12.3 - 15.4 %   Platelets 224 150 - 379 x10E3/uL   Neutrophils 49 %   Lymphs 40 %   Monocytes 7 %   Eos 3 %   Basos 1 %   Neutrophils Absolute 3.6 1.4 - 7.0 x10E3/uL   Lymphocytes Absolute 2.9 0.7 - 3.1 x10E3/uL   Monocytes Absolute 0.5 0.1 - 0.9 x10E3/uL   EOS (ABSOLUTE) 0.3 0.0 - 0.4 x10E3/uL   Basophils Absolute 0.1 0.0 - 0.2 x10E3/uL   Immature Granulocytes 0 %   Immature Grans (Abs) 0.0 0.0 - 0.1 x10E3/uL   Comprehensive metabolic panel  Result Value Ref Range   Glucose 203 (H) 65 - 99 mg/dL   BUN 11 6 - 24 mg/dL   Creatinine, Ser 0.91 0.76 - 1.27 mg/dL   GFR calc non Af Amer 93 >59 mL/min/1.73   GFR calc Af Amer 107 >59 mL/min/1.73   BUN/Creatinine Ratio 12 9 - 20   Sodium 137 134 - 144 mmol/L   Potassium 4.6 3.5 - 5.2 mmol/L   Chloride 98 96 - 106 mmol/L   CO2 22 18 - 29 mmol/L   Calcium 9.7 8.7 - 10.2 mg/dL   Total Protein 7.3 6.0 - 8.5 g/dL   Albumin 4.1 3.5 - 5.5 g/dL   Globulin, Total 3.2 1.5 - 4.5 g/dL   Albumin/Globulin Ratio 1.3 1.2 - 2.2   Bilirubin Total 0.5 0.0 - 1.2 mg/dL   Alkaline Phosphatase 68 39 - 117 IU/L   AST 34 0 - 40 IU/L   ALT 48 (H) 0 - 44 IU/L  Lipid Panel w/o Chol/HDL Ratio  Result Value Ref Range   Cholesterol, Total 200 (H) 100 - 199 mg/dL   Triglycerides 126 0 - 149 mg/dL   HDL 49 >39 mg/dL   VLDL Cholesterol Cal 25 5 - 40 mg/dL   LDL Calculated 126 (H) 0 - 99 mg/dL  PSA  Result Value Ref Range   Prostate Specific Ag, Serum 0.4 0.0 - 4.0 ng/mL  TSH  Result Value Ref Range   TSH 3.260 0.450 - 4.500 uIU/mL  Urinalysis, Routine w reflex microscopic (not at 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 Discussed can take several hours and advance.       Other Visit Diagnoses    Need for influenza vaccination       Relevant Orders   Flu Vaccine QUAD 36+ mos IM (Completed)       Follow up plan: Return in about 6 months (around 03/25/2017) for Physical Exam.

## 2016-09-24 NOTE — Assessment & Plan Note (Signed)
Followed up pain clinic

## 2016-09-24 NOTE — Assessment & Plan Note (Signed)
Will increase trazodone to 100 mg as needed for sleep. Discussed can take several hours and advance.

## 2016-09-24 NOTE — Patient Instructions (Addendum)

## 2016-09-24 NOTE — Addendum Note (Signed)
Addended byGolden Pop on: 09/24/2016 11:06 AM   Modules accepted: Orders

## 2016-09-25 LAB — BASIC METABOLIC PANEL
BUN/Creatinine Ratio: 10 (ref 9–20)
BUN: 9 mg/dL (ref 6–24)
CALCIUM: 9.3 mg/dL (ref 8.7–10.2)
CHLORIDE: 94 mmol/L — AB (ref 96–106)
CO2: 20 mmol/L (ref 18–29)
Creatinine, Ser: 0.93 mg/dL (ref 0.76–1.27)
GFR calc Af Amer: 104 mL/min/{1.73_m2} (ref >59)
GFR, EST NON AFRICAN AMERICAN: 90 mL/min/{1.73_m2} (ref >59)
GLUCOSE: 332 mg/dL — AB (ref 65–99)
POTASSIUM: 4.3 mmol/L (ref 3.5–5.2)
Sodium: 133 mmol/L — ABNORMAL LOW (ref 134–144)

## 2016-09-28 ENCOUNTER — Telehealth: Payer: Self-pay | Admitting: Family Medicine

## 2016-09-28 NOTE — Telephone Encounter (Signed)
Phone call Discussed with patient elevated glucose and new diagnosis of diabetes. We will need to see patient this week to develop a treatment plan and start treatment of his new diagnosis diabetes.

## 2016-09-29 DIAGNOSIS — M47817 Spondylosis without myelopathy or radiculopathy, lumbosacral region: Secondary | ICD-10-CM | POA: Diagnosis not present

## 2016-09-29 DIAGNOSIS — M533 Sacrococcygeal disorders, not elsewhere classified: Secondary | ICD-10-CM | POA: Diagnosis not present

## 2016-09-29 DIAGNOSIS — M791 Myalgia: Secondary | ICD-10-CM | POA: Diagnosis not present

## 2016-09-29 DIAGNOSIS — M5416 Radiculopathy, lumbar region: Secondary | ICD-10-CM | POA: Diagnosis not present

## 2016-09-30 ENCOUNTER — Telehealth: Payer: Self-pay | Admitting: Family Medicine

## 2016-09-30 ENCOUNTER — Ambulatory Visit (INDEPENDENT_AMBULATORY_CARE_PROVIDER_SITE_OTHER): Payer: Medicare Other | Admitting: Family Medicine

## 2016-09-30 ENCOUNTER — Encounter: Payer: Self-pay | Admitting: Family Medicine

## 2016-09-30 DIAGNOSIS — E1142 Type 2 diabetes mellitus with diabetic polyneuropathy: Secondary | ICD-10-CM | POA: Insufficient documentation

## 2016-09-30 DIAGNOSIS — E119 Type 2 diabetes mellitus without complications: Secondary | ICD-10-CM

## 2016-09-30 LAB — BAYER DCA HB A1C WAIVED: HB A1C: 8.7 % — AB (ref <7.0)

## 2016-09-30 MED ORDER — METFORMIN HCL 500 MG PO TABS: 1000.0000 mg | ORAL_TABLET | Freq: Two times a day (BID) | ORAL | 3 refills | Status: DC

## 2016-09-30 MED ORDER — EMPAGLIFLOZIN 25 MG PO TABS: 25.0000 mg | ORAL_TABLET | Freq: Every day | ORAL | 3 refills | Status: DC

## 2016-09-30 NOTE — Telephone Encounter (Signed)
Dr. Jeananne Rama to sign Rx form then will fax.

## 2016-09-30 NOTE — Telephone Encounter (Signed)
Patient called stating he needs Dr Jeananne Rama to fax over a scrip for the finger prick (needle)  to [facility]    Thank You  Santiago Glad

## 2016-09-30 NOTE — Assessment & Plan Note (Signed)
D[facility]ed diabetes [facility]. Will begin metformin 500 mg for a week increasing to 1000 for a week then 1500 for a week then 2000 Will also start Jardiance. One a day.

## 2016-09-30 NOTE — Progress Notes (Signed)
BP (!) 157/85 (BP Location: Left Arm, Patient Position: Sitting, Cuff Size: Normal)   Pulse 65   Temp 97.8 F (36.6 C)   Ht 5\' 8"  (1.727 m)   Wt 228 lb (103.4 kg)   SpO2 97%   BMI 34.67 kg/m    Subjective:    Patient ID: Edward Schneider., male    DOB: September 27, 1956, 60 y.o.   MRN: [id number]  HPI: Edward Schneider. is a 60 y.o. male  Chief Complaint  Patient presents with  . Diabetes    Patient's glucose came high from labs 09/24/2016. Discussion of diabetes managment.   Patient on review having classic diabetes symptoms polyuria polydipsia and blurry vision marked fatigue feeling bad for wound healing.'s been ongoing all fall. Glucose noted on labs to be markedly elevated.  Relevant past medical, surgical, family and social history reviewed and updated as indicated. Interim medical history since our last visit reviewed. Allergies and medications reviewed and updated.  Review of Systems  Constitutional: Positive for activity change, fatigue and fever.  Respiratory: Negative.   Cardiovascular: Negative.     Per HPI unless specifically indicated above     Objective:    BP (!) 157/85 (BP Location: Left Arm, Patient Position: Sitting, Cuff Size: Normal)   Pulse 65   Temp 97.8 F (36.6 C)   Ht 5\' 8"  (1.727 m)   Wt 228 lb (103.4 kg)   SpO2 97%   BMI 34.67 kg/m   Wt Readings from Last 3 Encounters:  09/30/16 228 lb (103.4 kg)  09/24/16 229 lb 6.4 oz (104.1 kg)  06/15/16 217 lb (98.4 kg)    Physical Exam  Constitutional: He is oriented to person, place, and time. He appears well-developed and well-nourished. No distress.  HENT:  Head: Normocephalic and atraumatic.  Right Ear: Hearing normal.  Left Ear: Hearing normal.  Nose: Nose normal.  Eyes: Conjunctivae and lids are normal. Right eye exhibits no discharge. Left eye exhibits no discharge. No scleral icterus.  Cardiovascular: Normal rate, regular rhythm and normal heart sounds.   Pulmonary/Chest: Effort normal  and breath sounds normal. No respiratory distress.  Musculoskeletal: Normal range of motion.  Neurological: He is alert and oriented to person, place, and time.  Skin: Skin is intact. No rash noted.  Psychiatric: He has a normal mood and affect. His speech is normal and behavior is normal. Judgment and thought content normal. Cognition and memory are normal.    Results for orders placed or performed in visit on [id number]  Basic metabolic panel  Result Value Ref Range   Glucose 332 (H) 65 - 99 mg/dL   BUN 9 6 - 24 mg/dL   Creatinine, Ser 0.93 0.76 - 1.27 mg/dL   GFR calc non Af Amer 90 >59 mL/min/1.73   GFR calc Af Amer 104 >59 mL/min/1.73   BUN/Creatinine Ratio 10 9 - 20   Sodium 133 (L) 134 - 144 mmol/L   Potassium 4.3 3.5 - 5.2 mmol/L   Chloride 94 (L) 96 - 106 mmol/L   CO2 20 18 - 29 mmol/L   Calcium 9.3 8.7 - 10.2 mg/dL      Assessment & Plan:   Problem List Items Addressed This Visit      Endocrine   Diabetes [facility])    Discussed diabetes care and [facility]. Will begin metformin 500 mg for a week increasing to 1000 for a week then 1500 for a week  then 2000 Will also start Jardiance. One a day.       Relevant Medications   metFORMIN (GLUCOPHAGE) 500 MG tablet   empagliflozin (JARDIANCE) 25 MG TABS tablet   Other Relevant Orders   Bayer DCA Hb A1c Waived   Amb Referral to Nutrition and Diabetic E       Follow up plan: Return in about 4 weeks (around 10/28/2016) for BMP,  Lipids, ALT, AST.

## 2016-10-06 ENCOUNTER — Other Ambulatory Visit: Payer: Self-pay | Admitting: Family Medicine

## 2016-10-06 DIAGNOSIS — L989 Disorder of the skin and subcutaneous tissue, unspecified: Secondary | ICD-10-CM

## 2016-10-06 MED ORDER — TRIAMCINOLONE ACETONIDE 0.1 % EX CREA: 1.0000 "application " | TOPICAL_CREAM | Freq: Two times a day (BID) | CUTANEOUS | 0 refills | Status: DC

## 2016-10-06 NOTE — Telephone Encounter (Signed)
Routing to provider  

## 2016-10-08 ENCOUNTER — Telehealth: Payer: Self-pay | Admitting: Family Medicine

## 2016-10-08 NOTE — Telephone Encounter (Signed)
Routing to provider.  Marking high priority.

## 2016-10-08 NOTE — Telephone Encounter (Signed)
Call pt 

## 2016-10-08 NOTE — Telephone Encounter (Signed)
At night for the last 3 nights pt gets dizzy with blurred vision, breaking out in sweats starting from the top of his head. Last night the pt was asleep and woke up in vomit and now has pain in his R side. He's not sure if it's because his blood sugar is high or low because he can only test once a day. He would like to speak to PCP to know what he should do.

## 2016-10-08 NOTE — Telephone Encounter (Signed)
Phone call Discussed with patient symptoms is noted below. Patient hasn't checked his blood sugar during this time discussed ways to do that patient will check blood sugar not checked on a routine basis day and day out. Patient with no other URI or other viral type symptoms.

## 2016-10-15 IMAGING — US US ABDOMEN LIMITED
1 series · 14 of 25 positions shown · non-contrast
Comparison: None.

CLINICAL DATA: Diffuse abdominal pain.

EXAM:
US ABDOMEN LIMITED - RIGHT UPPER QUADRANT

[Series 1: us abdomen limited · 0.26mm/px · 14 of 39 slices shown]
[im 1/39]
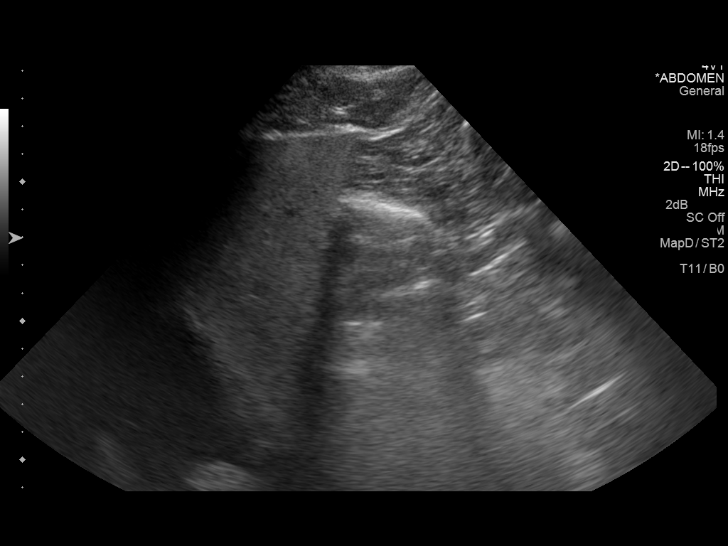
[im 4/39]
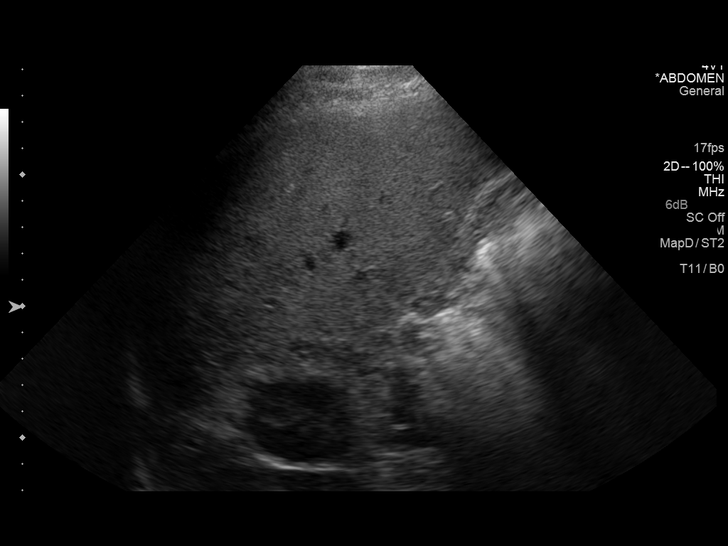
[im 7/39]
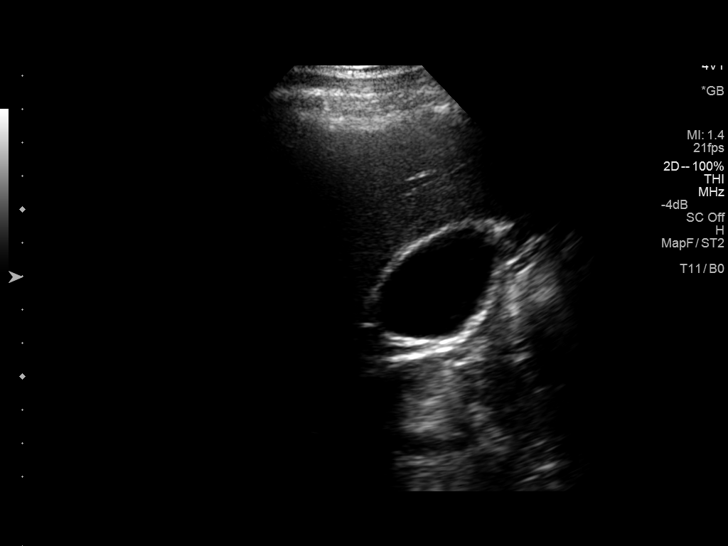
[im 10/39]
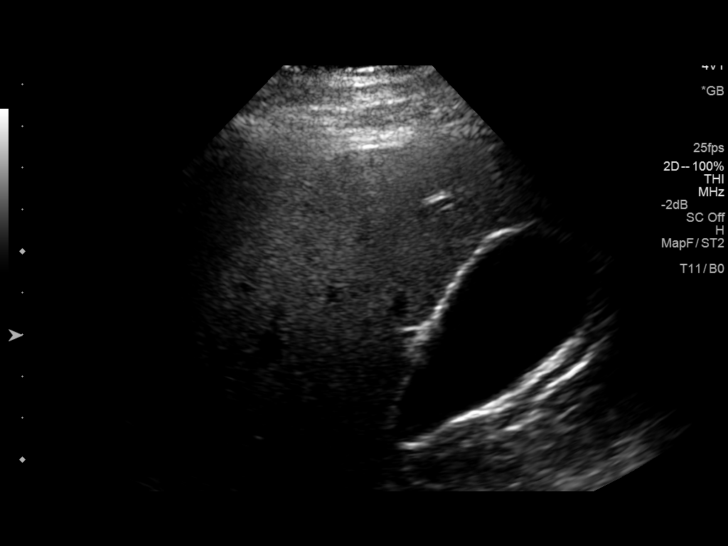
[im 13/39]
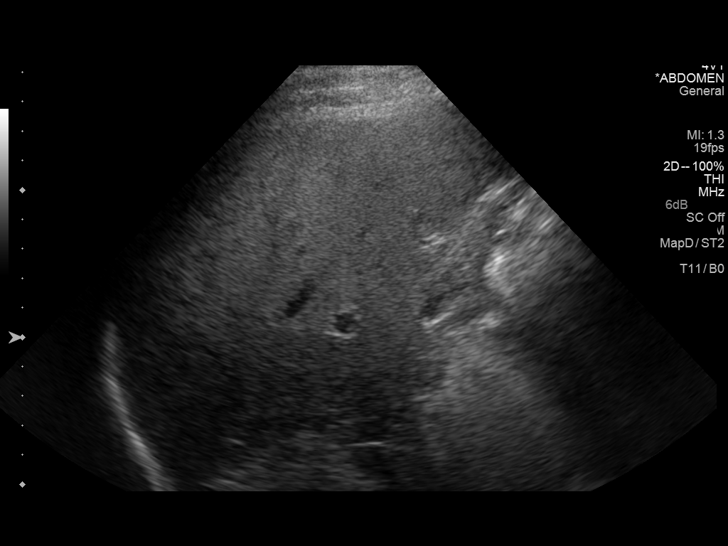
[im 15/39]
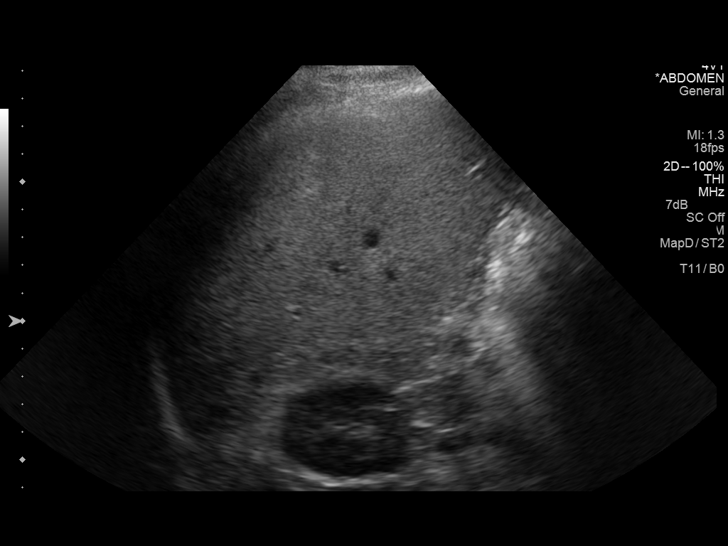
[im 18/39]
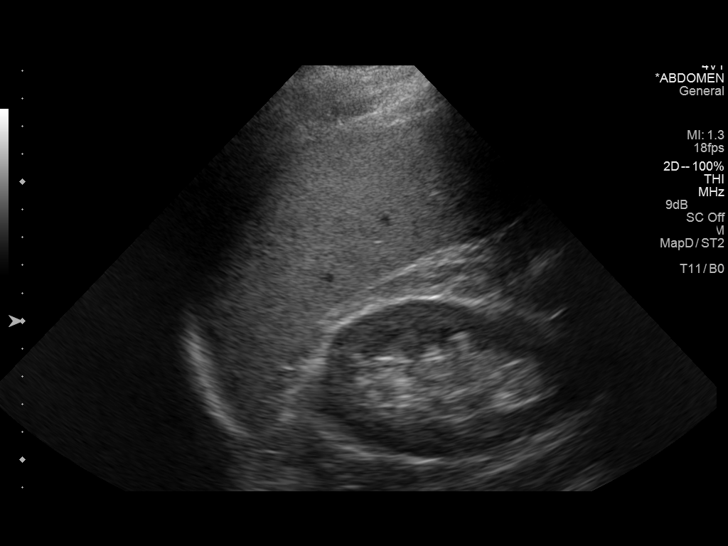
[im 21/39]
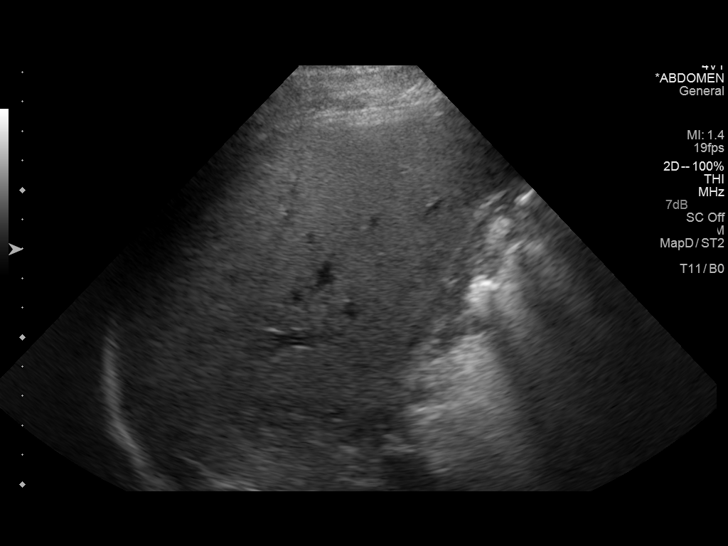
[im 24/39]
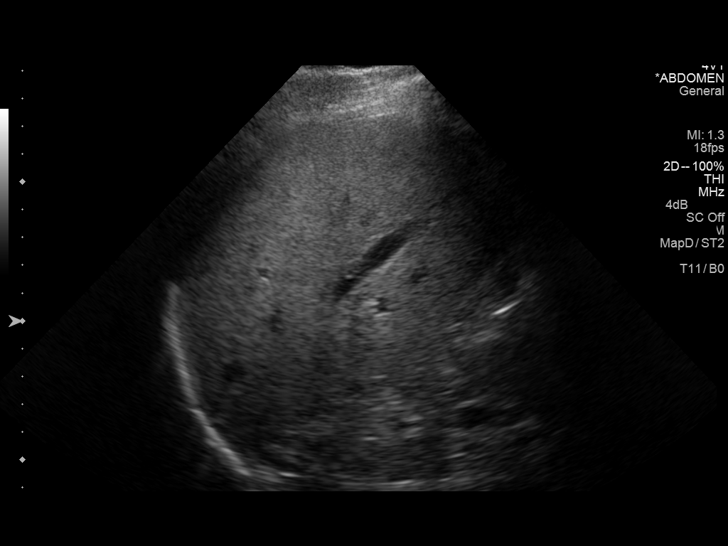
[im 26/39]
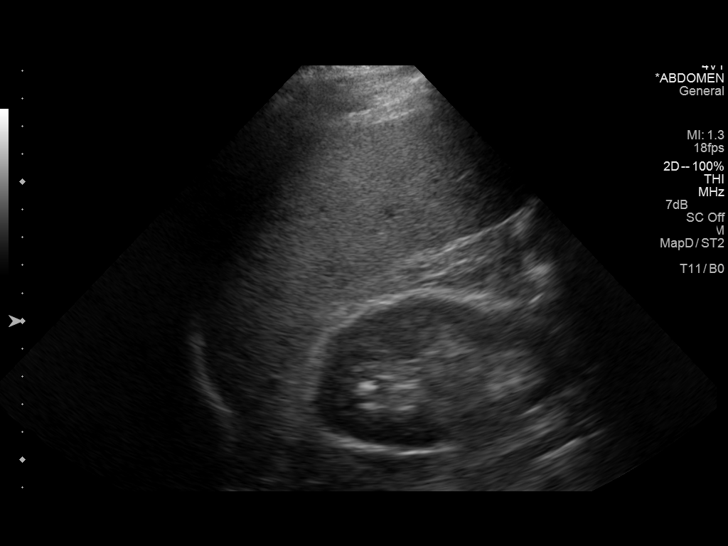
[im 29/39]
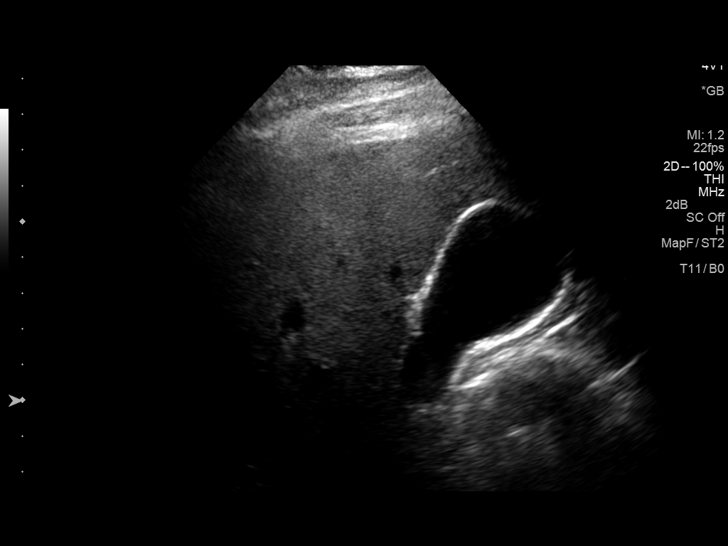
[im 32/39]
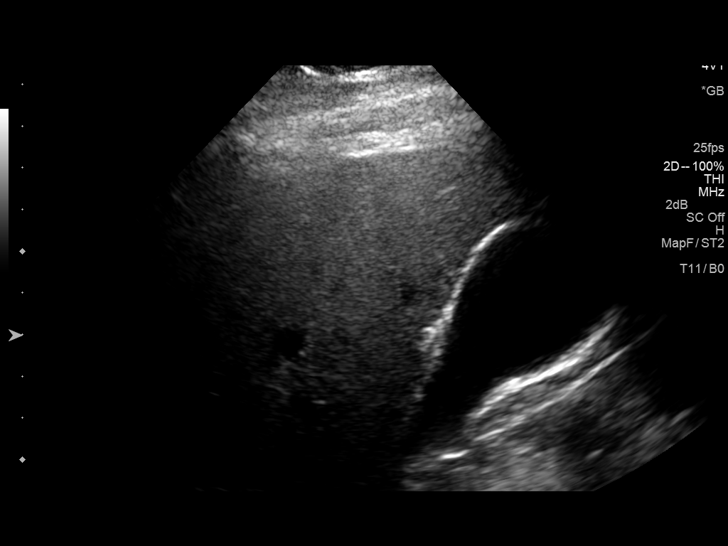
[im 35/39]
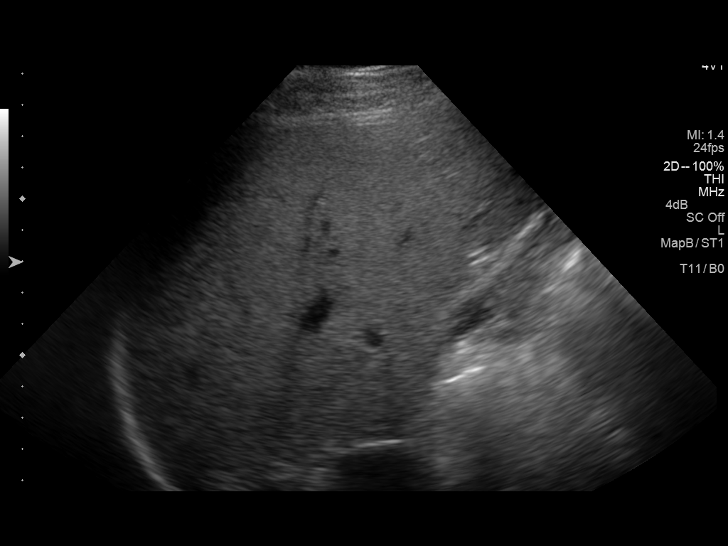
[im 39/39]
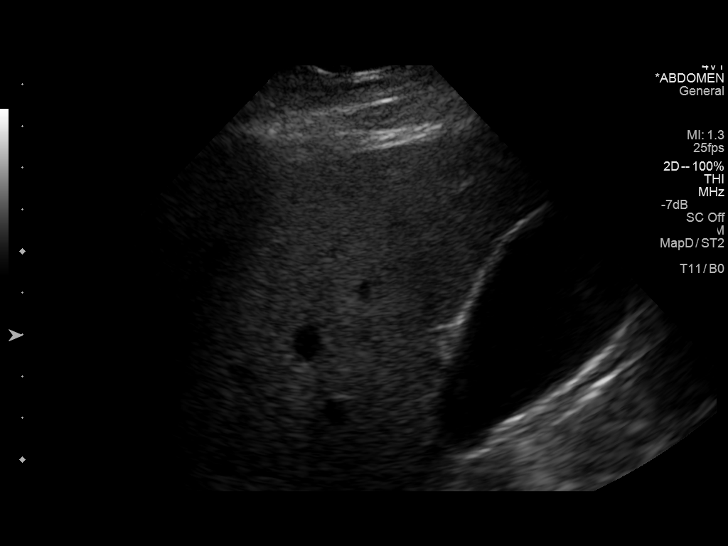

[14 of 25 positions shown; findings below may reference images not displayed]

FINDINGS: Gallbladder:

No gallstones or wall thickening visualized. No sonographic Murphy
sign noted.

Common bile duct:

Limited visualization but normal caliber where visualized (4 mm).

Liver:

No focal lesion identified. Within normal limits in parenchymal
echogenicity. Antegrade flow in the imaged portal venous system.
IMPRESSION: Negative abdominal ultrasound.

## 2016-10-23 ENCOUNTER — Encounter: Payer: Self-pay | Admitting: *Deleted

## 2016-10-23 ENCOUNTER — Encounter: Payer: Medicare Other | Attending: Family Medicine | Admitting: *Deleted

## 2016-10-23 VITALS — BP 132/86 | Ht 68.0 in | Wt 222.4 lb

## 2016-10-23 DIAGNOSIS — E1165 Type 2 diabetes mellitus with hyperglycemia: Secondary | ICD-10-CM

## 2016-10-23 DIAGNOSIS — E119 Type 2 diabetes mellitus without complications: Secondary | ICD-10-CM | POA: Diagnosis not present

## 2016-10-23 DIAGNOSIS — Z713 Dietary counseling and surveillance: Secondary | ICD-10-CM | POA: Diagnosis not present

## 2016-10-23 NOTE — Patient Instructions (Signed)
Check blood sugars 1 x day before breakfast or 2 hrs after supper every day  Exercise: Begin walking for 15  minutes 3 days a week  No strenuous exercise unless blood sugars less than 240  Eat 3 meals day,  1-2 snacks a day Space meals 4-6 hours apart Don't skip meals Drink plenty of fluids  Make an eye doctor appointment   Bring blood sugar records to the next class  Return for classes on:

## 2016-10-23 NOTE — Progress Notes (Signed)
Diabetes Self-Management Education  Visit Type: First/Initial  Appt. Start Time: 1105 Appt. End Time: 1210  10/23/2016  [name], identified by name and date of birth, is a 61 y.o. male with a diagnosis of Diabetes: Type 2.   ASSESSMENT  Blood pressure 132/86, height 5\' 8"  (1.727 m), weight 222 lb 6.4 oz (100.9 kg). Body mass index is 33.82 kg/m.      Diabetes Self-Management Education - 10/23/16 1437      Visit Information   Visit Type First/Initial     Initial Visit   Diabetes Type Type 2   Are you currently following a meal plan? No   Are you taking your medications as prescribed? Yes   Date Diagnosed December 2017     Health Coping   How would you rate your overall health? Poor     Psychosocial Assessment   Patient Belief/Attitude about Diabetes Motivated to manage diabetes   Self-care barriers None   Self-management support Doctor's office;Family   Patient Concerns Nutrition/Meal planning;Glycemic Control;Weight Control;Healthy Lifestyle   Special Needs None   Preferred Learning Style Auditory;Visual;Hands on   Delta in progress   How often do you need to have someone help you when you read instructions, pamphlets, or other written materials from your doctor or pharmacy? 1 - Never   What is the last grade level you completed in school? 2 years college     Pre-Education Assessment   Patient understands the diabetes disease and treatment process. Needs Instruction   Patient understands incorporating nutritional management into lifestyle. Needs Instruction   Patient undertands incorporating physical activity into lifestyle. Needs Instruction   Patient understands using medications safely. Needs Instruction   Patient understands monitoring blood glucose, interpreting and using results Needs Review   Patient understands prevention, detection, and treatment of acute complications. Needs Instruction   Patient understands prevention, detection,  and treatment of chronic complications. Needs Instruction   Patient understands how to develop strategies to address psychosocial issues. Needs Instruction   Patient understands how to develop strategies to promote health/change behavior. Needs Instruction     Complications   Last HgB A1C per patient/outside source 8.7 %  09/30/16   How often do you check your blood sugar? 3-4 times / week   Postprandial Blood glucose range (mg/dL) >200  Pt reports post meal readings 200-300's mg/dL.    Have you had a dilated eye exam in the past 12 months? No   Have you had a dental exam in the past 12 months? No  dentures   Are you checking your feet? No     Dietary Intake   Breakfast skips or has occasional egg sandwich   Lunch skips   Snack (afternoon) may have peanut butter crackers or yogurt   Dinner spaghetti, burger, wheat bread, potatoes, green beans, cabbage, turnip greens   Beverage(s) diet Mt Dew     Exercise   Exercise Type ADL's     Patient Education   Previous Diabetes Education No   Disease state  Definition of diabetes, type 1 and 2, and the diagnosis of diabetes   Nutrition management  Role of diet in the treatment of diabetes and the relationship between the three main macronutrients and blood glucose level   Physical activity and exercise  Role of exercise on diabetes management, blood pressure control and cardiac health.   Medications Reviewed patients medication for diabetes, action, purpose, timing of dose and side effects.   Monitoring Purpose and frequency  of SMBG.;Taught/discussed recording of test results and interpretation of SMBG.;Identified appropriate SMBG and/or A1C goals.   Chronic complications Relationship between chronic complications and blood glucose control;Retinopathy and reason for yearly dilated eye exams   Psychosocial adjustment Identified and addressed patients feelings and concerns about diabetes     Individualized Goals (developed by patient)    Reducing Risk Improve blood sugars Lose weight Lead a healthier lifestyle Become more fit     Outcomes   Expected Outcomes Demonstrated interest in learning. Expect positive outcomes      Individualized Plan for Diabetes Self-Management Training:   Learning Objective:  Patient will have a greater understanding of diabetes self-management. Patient education plan is to attend individual and/or group sessions per assessed needs and concerns.   Plan:   Patient Instructions  Check blood sugars 1 x day before breakfast or 2 hrs after supper every day Exercise: Begin walking for 15  minutes 3 days a week  No strenuous exercise unless blood sugars less than 240 Eat 3 meals day,  1-2 snacks a day Space meals 4-6 hours apart Don't skip meals Drink plenty of fluids Make an eye doctor appointment  Bring blood sugar records to the next class   Expected Outcomes:  Demonstrated interest in learning. Expect positive outcomes  Education material provided:  General Meal Planning Guidelines Simple Meal Plan  If problems or questions, patient to contact team via:  Edward Schneider, [place], [place], CDE [phone]  Future DSME appointment:  October 26, 2016 for Diabetes Class 1

## 2016-10-26 ENCOUNTER — Encounter: Payer: Self-pay | Admitting: Dietician

## 2016-10-26 ENCOUNTER — Encounter: Payer: Medicare Other | Admitting: Dietician

## 2016-10-26 VITALS — Ht 68.0 in | Wt 220.7 lb

## 2016-10-26 DIAGNOSIS — Z713 Dietary counseling and surveillance: Secondary | ICD-10-CM | POA: Diagnosis not present

## 2016-10-26 DIAGNOSIS — E1165 Type 2 diabetes mellitus with hyperglycemia: Secondary | ICD-10-CM

## 2016-10-26 DIAGNOSIS — E119 Type 2 diabetes mellitus without complications: Secondary | ICD-10-CM | POA: Diagnosis not present

## 2016-10-26 NOTE — Progress Notes (Signed)

## 2016-10-29 ENCOUNTER — Telehealth: Payer: Self-pay | Admitting: Family Medicine

## 2016-10-29 MED ORDER — CANAGLIFLOZIN 300 MG PO TABS: 300.0000 mg | ORAL_TABLET | Freq: Every day | ORAL | 6 refills | Status: DC

## 2016-10-29 NOTE — Telephone Encounter (Signed)
Patient called back to give you the name of the medication Indokanna, this is the medication.  Thank You

## 2016-10-29 NOTE — Telephone Encounter (Signed)
Catheter patient check with his insurance her pharmacy if [place] or Dede Query is cheaper

## 2016-10-29 NOTE — Telephone Encounter (Signed)
Routing to provider, he needs his Vania Rea changed due to cost.

## 2016-10-29 NOTE — Telephone Encounter (Signed)
Routing to provider. It sounds like Invokana will be covered.

## 2016-10-29 NOTE — Telephone Encounter (Signed)
Have pt check.Marland KitchenMarland KitchenMarland Kitchen

## 2016-10-29 NOTE — Telephone Encounter (Signed)
Called and relayed info to patient. Medications were spelled out for patient. He will call us back once he know's which medication is cheaper.

## 2016-11-02 ENCOUNTER — Encounter: Payer: Self-pay | Admitting: Dietician

## 2016-11-02 ENCOUNTER — Telehealth: Payer: Self-pay

## 2016-11-02 ENCOUNTER — Encounter: Payer: Medicare Other | Admitting: Dietician

## 2016-11-02 VITALS — Wt 224.6 lb

## 2016-11-02 DIAGNOSIS — Z713 Dietary counseling and surveillance: Secondary | ICD-10-CM | POA: Diagnosis not present

## 2016-11-02 DIAGNOSIS — E119 Type 2 diabetes mellitus without complications: Secondary | ICD-10-CM | POA: Diagnosis not present

## 2016-11-02 DIAGNOSIS — E1165 Type 2 diabetes mellitus with hyperglycemia: Secondary | ICD-10-CM

## 2016-11-02 NOTE — Telephone Encounter (Signed)
Phone call Discussed with patient Dr. crisp call patient back and he has an appointment tomorrow with Dr. Primus Bravo. Reviewed diabetes medicines patient has appointment later this week will see patient then and review.

## 2016-11-02 NOTE — Telephone Encounter (Signed)
Patient called very upset. Patient was seeing Dr. Primus Bravo in [facility].  Patient stated he had an appointment today at 11:15 but when he got to the office it was closed. He called [facility] and they gave him the number to Dr. Ethel Rana new office, however Dr. Primus Bravo said he couldn't see him and no longer see him. Patient stated he needed a new referral to a pain clinic. Patient is completely out of his medication.  He also says he took his last Invokana today and insurance won't pay for it. I explained to patient that I see nothing in his chart, nor have I received a PA. But I would start one today.  Patient said he was on the way to the office to talk to Dr. Jeananne Rama but I talked him down. I explained Dr. Jeananne Rama is in clinic, and there's nothing he would be able to do right this minute. But I would send a message to Dr. Jeananne Rama regarding patient's referral because he's out of his medication and start a PA on his Invokana.

## 2016-11-02 NOTE — Progress Notes (Signed)
Appt. Start Time: 1730 Appt. End Time:2030  Class 2 Nutritional Management - identify sources of carbohydrate, protein and fat; plan balanced meals; estimate servings of carbohydrates in meals  Psychosocial - identify DM as a source of stress; state the effects of stress on BG control  Exercise - describe the effects of exercise on blood glucose and importance of regular exercise in controlling diabetes; state a plan for personal exercise; verbalize contraindications for exercise  Self-Monitoring - state importance of SMBG; use SMBG results to effectively manage diabetes; identify importance of regular HbA1C testing and goals for results  Acute Complications - recognize hyperglycemia and hypoglycemia with causes and effects; identify blood glucose results as high, low or in control; list steps in treating and preventing high and low blood glucose  Sick Day Guidelines: state appropriate measure to manage blood glucose when ill (need for meds, HBGM plan, when to call physician, need for fluids)  Chronic Complications/Foot, Skin, Eye Dental Care - identify possible long-term complications of diabetes (retinopathy, neuropathy, nephropathy, cardiovascular disease, infections); explain steps in prevention and treatment of chronic complications; state importance of daily self-foot exams; describe how to examine feet and what to look for; explain appropriate eye and dental care  Lifestyle Changes/Goals - state benefits of making appropriate lifestyle changes; identify habits that need to change (meals, tobacco, alcohol); identify strategies to reduce risk factors for personal health  Pregnancy/Sexual Health - state importance of good blood glucose control in preventing sexual problems (impotence, vaginal dryness, infections, loss of desire)  Teaching Materials Used: Class 2 Slide Packet A1C Pamphlet Foot Care Literature Kidney Test Handout Quick and "Balanced" Meal Ideas Carb Counting and Meal  Planning Book Goals for Class 2 

## 2016-11-03 ENCOUNTER — Other Ambulatory Visit: Payer: Self-pay | Admitting: Pain Medicine

## 2016-11-03 DIAGNOSIS — M5416 Radiculopathy, lumbar region: Secondary | ICD-10-CM | POA: Diagnosis not present

## 2016-11-03 DIAGNOSIS — M791 Myalgia: Secondary | ICD-10-CM | POA: Diagnosis not present

## 2016-11-03 DIAGNOSIS — M533 Sacrococcygeal disorders, not elsewhere classified: Secondary | ICD-10-CM | POA: Diagnosis not present

## 2016-11-03 DIAGNOSIS — M47817 Spondylosis without myelopathy or radiculopathy, lumbosacral region: Secondary | ICD-10-CM | POA: Diagnosis not present

## 2016-11-05 ENCOUNTER — Ambulatory Visit: Payer: Medicare Other | Admitting: Family Medicine

## 2016-11-06 ENCOUNTER — Encounter: Payer: Self-pay | Admitting: Family Medicine

## 2016-11-06 ENCOUNTER — Ambulatory Visit (INDEPENDENT_AMBULATORY_CARE_PROVIDER_SITE_OTHER): Payer: Medicare Other | Admitting: Family Medicine

## 2016-11-06 VITALS — BP 136/82 | HR 91 | Ht 68.0 in | Wt 230.0 lb

## 2016-11-06 DIAGNOSIS — G8929 Other chronic pain: Secondary | ICD-10-CM

## 2016-11-06 DIAGNOSIS — M5442 Lumbago with sciatica, left side: Secondary | ICD-10-CM | POA: Diagnosis not present

## 2016-11-06 DIAGNOSIS — E1142 Type 2 diabetes mellitus with diabetic polyneuropathy: Secondary | ICD-10-CM

## 2016-11-06 DIAGNOSIS — E7800 Pure hypercholesterolemia, unspecified: Secondary | ICD-10-CM

## 2016-11-06 DIAGNOSIS — E119 Type 2 diabetes mellitus without complications: Secondary | ICD-10-CM | POA: Diagnosis not present

## 2016-11-06 DIAGNOSIS — M5441 Lumbago with sciatica, right side: Secondary | ICD-10-CM

## 2016-11-06 MED ORDER — CANAGLIFLOZIN 300 MG PO TABS: 300.0000 mg | ORAL_TABLET | Freq: Every day | ORAL | 12 refills | Status: DC

## 2016-11-06 NOTE — Assessment & Plan Note (Signed)
Going to pain clinic in [place] we will fill Cymbalta as that does double duty for both diabetes and back pain and depression

## 2016-11-06 NOTE — Assessment & Plan Note (Signed)
Discussed type 2 diabetes medications and care cost of medications gave coupons for 30 day free trial of Invokanna and patient disabled up $200 in the meantime to get his first prescription then after that medications will only cause $45 which is cheap

## 2016-11-06 NOTE — Progress Notes (Signed)
BP 136/82 (BP Location: Left Arm)   Pulse 91   Ht 5\' 8"  (1.727 m)   Wt 230 lb (104.3 kg)   SpO2 99%   BMI 34.97 kg/m    Subjective:    Patient ID: Edward Flesher., male    DOB: Aug 22, 1956, 61 y.o.   MRN: [id number]  HPI: Edward Taveras. is a 61 y.o. male  Chief Complaint  Patient presents with  . Follow-up  . Hypertension  . Diabetes   Patient follow-up having problems with coming up with initial co-pay for medications and has not been on Invokanna. Otherwise been taking metformin without problems. Patient also has not been taking Cymbalta which helps a great deal with both his pain and diabetic peripheral neuropathy. Patient also having difficulty getting an initial co-pay for this medication.  Relevant past medical, surgical, family and social history reviewed and updated as indicated. Interim medical history since our last visit reviewed. Allergies and medications reviewed and updated.  Review of Systems  Constitutional: Negative.   Respiratory: Negative.   Cardiovascular: Negative.     Per HPI unless specifically indicated above     Objective:    BP 136/82 (BP Location: Left Arm)   Pulse 91   Ht 5\' 8"  (1.727 m)   Wt 230 lb (104.3 kg)   SpO2 99%   BMI 34.97 kg/m   Wt Readings from Last 3 Encounters:  11/06/16 230 lb (104.3 kg)  11/02/16 224 lb 9.6 oz (101.9 kg)  10/26/16 220 lb 11.2 oz (100.1 kg)    Physical Exam  Constitutional: He is oriented to person, place, and time. He appears well-developed and well-nourished. No distress.  HENT:  Head: Normocephalic and atraumatic.  Right Ear: Hearing normal.  Left Ear: Hearing normal.  Nose: Nose normal.  Eyes: Conjunctivae and lids are normal. Right eye exhibits no discharge. Left eye exhibits no discharge. No scleral icterus.  Cardiovascular: Normal rate, regular rhythm and normal heart sounds.   Pulmonary/Chest: Effort normal and breath sounds normal. No respiratory distress.  Musculoskeletal: Normal  range of motion.  Neurological: He is alert and oriented to person, place, and time.  Skin: Skin is intact. No rash noted.  Psychiatric: He has a normal mood and affect. His speech is normal and behavior is normal. Judgment and thought content normal. Cognition and memory are normal.    Results for orders placed or performed in visit on 09/30/16  Bayer DCA Hb A1c Waived  Result Value Ref Range   Bayer DCA Hb A1c Waived 8.7 (H) <7.0 %      Assessment & Plan:   Problem List Items Addressed This Visit      Endocrine   DM type 2 with diabetic peripheral neuropathy ([place]) - Primary    Discussed type 2 diabetes medications and care cost of medications gave coupons for 30 day free trial of Invokanna and patient disabled up $200 in the meantime to get his first prescription then after that medications will only cause $45 which is cheap      Relevant Medications   canagliflozin (INVOKANA) 300 MG TABS tablet     Other   Back pain, chronic    Going to pain clinic in stable we will fill Cymbalta as that does double duty for both diabetes and back pain and depression      Hypercholesterolemia       Follow up plan: Return in about 2 months (around 01/04/2017) for BMP,  Lipids, ALT, AST,  Hemoglobin A1c.

## 2016-11-09 ENCOUNTER — Ambulatory Visit: Payer: Medicare Other

## 2016-11-11 ENCOUNTER — Telehealth: Payer: Self-pay | Admitting: Dietician

## 2016-11-11 NOTE — Telephone Encounter (Signed)
Called patient to reschedule class 3, which he missed on 11/09/16. Left a voicemail message requesting a call back; offered next class 3 on 12/07/16.

## 2016-11-16 ENCOUNTER — Ambulatory Visit: Payer: Medicare Other

## 2016-12-01 DIAGNOSIS — M533 Sacrococcygeal disorders, not elsewhere classified: Secondary | ICD-10-CM | POA: Diagnosis not present

## 2016-12-01 DIAGNOSIS — M791 Myalgia: Secondary | ICD-10-CM | POA: Diagnosis not present

## 2016-12-01 DIAGNOSIS — M5416 Radiculopathy, lumbar region: Secondary | ICD-10-CM | POA: Diagnosis not present

## 2016-12-01 DIAGNOSIS — M47817 Spondylosis without myelopathy or radiculopathy, lumbosacral region: Secondary | ICD-10-CM | POA: Diagnosis not present

## 2016-12-07 ENCOUNTER — Ambulatory Visit: Payer: Medicare Other

## 2016-12-16 ENCOUNTER — Telehealth: Payer: Self-pay | Admitting: Dietician

## 2016-12-16 NOTE — Telephone Encounter (Signed)
Called patient to reschedule Diabetes Class 3, which he missed on 12/07/16. Left voicemail message requesting a call back.

## 2016-12-28 ENCOUNTER — Other Ambulatory Visit: Payer: Self-pay | Admitting: Pain Medicine

## 2016-12-28 DIAGNOSIS — M5416 Radiculopathy, lumbar region: Secondary | ICD-10-CM | POA: Diagnosis not present

## 2016-12-28 DIAGNOSIS — G894 Chronic pain syndrome: Secondary | ICD-10-CM | POA: Diagnosis not present

## 2016-12-28 DIAGNOSIS — M47817 Spondylosis without myelopathy or radiculopathy, lumbosacral region: Secondary | ICD-10-CM | POA: Diagnosis not present

## 2016-12-28 DIAGNOSIS — M533 Sacrococcygeal disorders, not elsewhere classified: Secondary | ICD-10-CM | POA: Diagnosis not present

## 2016-12-28 DIAGNOSIS — Z79891 Long term (current) use of opiate analgesic: Secondary | ICD-10-CM | POA: Diagnosis not present

## 2016-12-28 DIAGNOSIS — M791 Myalgia: Secondary | ICD-10-CM | POA: Diagnosis not present

## 2016-12-29 ENCOUNTER — Other Ambulatory Visit: Payer: Self-pay | Admitting: Pain Medicine

## 2016-12-29 DIAGNOSIS — R52 Pain, unspecified: Secondary | ICD-10-CM

## 2016-12-29 DIAGNOSIS — J189 Pneumonia, unspecified organism: Secondary | ICD-10-CM

## 2016-12-31 ENCOUNTER — Ambulatory Visit: Payer: Medicare Other

## 2017-01-04 ENCOUNTER — Ambulatory Visit: Payer: Medicare Other

## 2017-01-05 ENCOUNTER — Ambulatory Visit (INDEPENDENT_AMBULATORY_CARE_PROVIDER_SITE_OTHER): Payer: Medicare Other | Admitting: Family Medicine

## 2017-01-05 ENCOUNTER — Encounter: Payer: Self-pay | Admitting: Family Medicine

## 2017-01-05 VITALS — BP 128/80 | HR 73 | Ht 68.0 in | Wt 220.0 lb

## 2017-01-05 DIAGNOSIS — E1142 Type 2 diabetes mellitus with diabetic polyneuropathy: Secondary | ICD-10-CM | POA: Diagnosis not present

## 2017-01-05 DIAGNOSIS — I700 Atherosclerosis of aorta: Secondary | ICD-10-CM | POA: Diagnosis not present

## 2017-01-05 DIAGNOSIS — E7800 Pure hypercholesterolemia, unspecified: Secondary | ICD-10-CM | POA: Diagnosis not present

## 2017-01-05 DIAGNOSIS — J449 Chronic obstructive pulmonary disease, unspecified: Secondary | ICD-10-CM

## 2017-01-05 DIAGNOSIS — E119 Type 2 diabetes mellitus without complications: Secondary | ICD-10-CM | POA: Diagnosis not present

## 2017-01-05 LAB — HEMOGLOBIN A1C
ESTIMATED AVERAGE GLUCOSE: 148 mg/dL
Hgb A1c MFr Bld: 6.8 % — ABNORMAL HIGH (ref 4.8–5.6)

## 2017-01-05 LAB — MICROALBUMIN, URINE WAIVED
Creatinine, Urine Waived: 100 mg/dL (ref 10–300)
Microalb, Ur Waived: 10 mg/L (ref 0–19)

## 2017-01-05 LAB — LP+ALT+AST PICCOLO, WAIVED
ALT (SGPT) PICCOLO, WAIVED: 33 U/L (ref 10–47)
AST (SGOT) PICCOLO, WAIVED: 30 U/L (ref 11–38)
CHOL/HDL RATIO PICCOLO,WAIVE: 3.8 mg/dL
Cholesterol Piccolo, Waived: 221 mg/dL — ABNORMAL HIGH (ref <200)
HDL Chol Piccolo, Waived: 58 mg/dL (ref >59)
LDL Chol Calc Piccolo Waived: 134 mg/dL — ABNORMAL HIGH (ref <100)
Triglycerides Piccolo,Waived: 149 mg/dL (ref <150)
VLDL Chol Calc Piccolo,Waive: 30 mg/dL — ABNORMAL HIGH (ref <30)

## 2017-01-05 MED ORDER — CANAGLIFLOZIN 300 MG PO TABS: 300.0000 mg | ORAL_TABLET | Freq: Every day | ORAL | 1 refills | Status: DC

## 2017-01-05 MED ORDER — TRAZODONE HCL 100 MG PO TABS: 100.0000 mg | ORAL_TABLET | Freq: Every evening | ORAL | 1 refills | Status: DC | PRN

## 2017-01-05 MED ORDER — EPINEPHRINE 0.3 MG/0.3ML IJ SOAJ: 0.3000 mg | Freq: Once | INTRAMUSCULAR | 1 refills | Status: AC

## 2017-01-05 MED ORDER — METFORMIN HCL 500 MG PO TABS: 1000.0000 mg | ORAL_TABLET | Freq: Two times a day (BID) | ORAL | 1 refills | Status: DC

## 2017-01-05 MED ORDER — ATORVASTATIN CALCIUM 20 MG PO TABS: 20.0000 mg | ORAL_TABLET | Freq: Every day | ORAL | 1 refills | Status: DC

## 2017-01-05 MED ORDER — DULOXETINE HCL 20 MG PO CPEP: 20.0000 mg | ORAL_CAPSULE | Freq: Every day | ORAL | 1 refills | Status: DC

## 2017-01-05 NOTE — Assessment & Plan Note (Signed)
Patient's cholesterol is back up with history of MI and aortic atherosclerosis and diabetes will restart medication reviewed cholesterol medication and side effects. Patient previously took simvastatin with myalgias will avoid that. Will start atorvastatin 20 mg and follow-up in 3 months to reassess.

## 2017-01-05 NOTE — Assessment & Plan Note (Signed)
The current medical regimen is effective;  continue present plan and medications.  

## 2017-01-05 NOTE — Progress Notes (Signed)
BP 128/80 (BP Location: Left Arm)   Pulse 73   Ht 5\' 8"  (1.727 m)   Wt 220 lb (99.8 kg)   SpO2 95%   BMI 33.45 kg/m    Subjective:    Patient ID: Edward Flesher., male    DOB: 09/11/1956, 61 y.o.   MRN: [id number]  HPI: Edward Van. is a 61 y.o. male  Chief Complaint  Patient presents with  . Follow-up  . Diabetes  Patient follow-up doing well noted low blood sugar spells no issues with diabetes takes meds without problems has new insurance and is can really get medications without cost. Depression nerves and myalgias neuropathy that with Cymbalta is doing well no complaints. Taking low-dose without problems Patient working to pain clinic with adequate pain control.   Relevant past medical, surgical, family and social history reviewed and updated as indicated. Interim medical history since our last visit reviewed. Allergies and medications reviewed and updated.  Review of Systems  Constitutional: Negative.   Respiratory: Negative.   Cardiovascular: Negative.     Per HPI unless specifically indicated above     Objective:    BP 128/80 (BP Location: Left Arm)   Pulse 73   Ht 5\' 8"  (1.727 m)   Wt 220 lb (99.8 kg)   SpO2 95%   BMI 33.45 kg/m   Wt Readings from Last 3 Encounters:  01/05/17 220 lb (99.8 kg)  11/06/16 230 lb (104.3 kg)  11/02/16 224 lb 9.6 oz (101.9 kg)    Physical Exam  Results for orders placed or performed in visit on 09/30/16  Bayer DCA Hb A1c Waived  Result Value Ref Range   Bayer DCA Hb A1c Waived 8.7 (H) <7.0 %      Assessment & Plan:   Problem List Items Addressed This Visit      Cardiovascular and Mediastinum   Atherosclerosis of abdominal aorta ([place])    The current medical regimen is effective;  continue present plan and medications.       Relevant Medications   atorvastatin (LIPITOR) 20 MG tablet   EPINEPHrine 0.3 mg/0.3 mL IJ SOAJ injection     Respiratory   COPD (chronic obstructive pulmonary disease) ([facility])   The current medical regimen is effective;  continue present plan and medications.         Endocrine   DM type 2 with diabetic peripheral neuropathy ([place]) - Primary    The current medical regimen is effective;  continue present plan and medications.       Relevant Medications   canagliflozin (INVOKANA) 300 MG TABS tablet   metFORMIN (GLUCOPHAGE) 500 MG tablet   DULoxetine (CYMBALTA) 20 MG capsule   atorvastatin (LIPITOR) 20 MG tablet   traZODone (DESYREL) 100 MG tablet   Other Relevant Orders   Basic metabolic panel   LP+ALT+AST Piccolo, Waived   Hemoglobin A1c   Microalbumin, Urine Waived     Other   Hypercholesterolemia    Patient's cholesterol is back up with history of MI and aortic atherosclerosis and diabetes will restart medication reviewed cholesterol medication and side effects. Patient previously took simvastatin with myalgias will avoid that. Will start atorvastatin 20 mg and follow-up in 3 months to reassess.      Relevant Medications   atorvastatin (LIPITOR) 20 MG tablet   EPINEPHrine 0.3 mg/0.3 mL IJ SOAJ injection   Other Relevant Orders   Basic metabolic panel   LP+ALT+AST Piccolo, Waived   Hemoglobin A1c   Microalbumin,  Urine Waived    Other Visit Diagnoses    Type 2 diabetes mellitus without complication, without long-term current use of insulin ([facility])       Relevant Medications   canagliflozin (INVOKANA) 300 MG TABS tablet   metFORMIN (GLUCOPHAGE) 500 MG tablet   atorvastatin (LIPITOR) 20 MG tablet       Follow up plan: Return in about 3 months (around 04/07/2017) for Physical Exam, Hemoglobin A1c.

## 2017-01-06 ENCOUNTER — Encounter: Payer: Self-pay | Admitting: Family Medicine

## 2017-01-06 LAB — BASIC METABOLIC PANEL
BUN/Creatinine Ratio: 12 (ref 9–20)
BUN: 11 mg/dL (ref 6–24)
CALCIUM: 9.4 mg/dL (ref 8.7–10.2)
CO2: 21 mmol/L (ref 18–29)
CREATININE: 0.91 mg/dL (ref 0.76–1.27)
Chloride: 102 mmol/L (ref 96–106)
GFR calc Af Amer: 106 mL/min/{1.73_m2} (ref >59)
GFR, EST NON AFRICAN AMERICAN: 92 mL/min/{1.73_m2} (ref >59)
Glucose: 149 mg/dL — ABNORMAL HIGH (ref 65–99)
Potassium: 4.5 mmol/L (ref 3.5–5.2)
Sodium: 140 mmol/L (ref 134–144)

## 2017-01-11 ENCOUNTER — Encounter: Payer: Self-pay | Admitting: Dietician

## 2017-01-11 ENCOUNTER — Encounter: Payer: Medicare Other | Attending: Family Medicine | Admitting: Dietician

## 2017-01-11 ENCOUNTER — Ambulatory Visit: Payer: Medicare Other

## 2017-01-11 VITALS — BP 130/90 | Ht 68.0 in | Wt 219.7 lb

## 2017-01-11 DIAGNOSIS — E119 Type 2 diabetes mellitus without complications: Secondary | ICD-10-CM | POA: Diagnosis not present

## 2017-01-11 DIAGNOSIS — E1165 Type 2 diabetes mellitus with hyperglycemia: Secondary | ICD-10-CM

## 2017-01-11 DIAGNOSIS — Z713 Dietary counseling and surveillance: Secondary | ICD-10-CM | POA: Diagnosis not present

## 2017-01-11 NOTE — Progress Notes (Signed)

## 2017-01-25 ENCOUNTER — Other Ambulatory Visit: Payer: Self-pay

## 2017-01-25 DIAGNOSIS — Z79891 Long term (current) use of opiate analgesic: Secondary | ICD-10-CM | POA: Diagnosis not present

## 2017-01-25 DIAGNOSIS — M5136 Other intervertebral disc degeneration, lumbar region: Secondary | ICD-10-CM | POA: Diagnosis not present

## 2017-01-25 DIAGNOSIS — M5033 Other cervical disc degeneration, cervicothoracic region: Secondary | ICD-10-CM | POA: Diagnosis not present

## 2017-01-25 DIAGNOSIS — M5031 Other cervical disc degeneration,  high cervical region: Secondary | ICD-10-CM | POA: Diagnosis not present

## 2017-01-25 DIAGNOSIS — M5416 Radiculopathy, lumbar region: Secondary | ICD-10-CM | POA: Diagnosis not present

## 2017-01-25 DIAGNOSIS — M542 Cervicalgia: Secondary | ICD-10-CM | POA: Diagnosis not present

## 2017-01-25 DIAGNOSIS — E119 Type 2 diabetes mellitus without complications: Secondary | ICD-10-CM

## 2017-01-25 MED ORDER — METFORMIN HCL 500 MG PO TABS: 1000.0000 mg | ORAL_TABLET | Freq: Two times a day (BID) | ORAL | 1 refills | Status: DC

## 2017-01-25 NOTE — Telephone Encounter (Signed)
Last OV: 01/05/17 Next OV: 03/29/17   Lab Results  Component Value Date   HGBA1C 6.8 (H) 01/05/2017

## 2017-01-26 ENCOUNTER — Other Ambulatory Visit: Payer: Self-pay | Admitting: Family Medicine

## 2017-01-26 DIAGNOSIS — E7800 Pure hypercholesterolemia, unspecified: Secondary | ICD-10-CM

## 2017-01-26 MED ORDER — ATORVASTATIN CALCIUM 20 MG PO TABS: 20.0000 mg | ORAL_TABLET | Freq: Every day | ORAL | 1 refills | Status: DC

## 2017-01-26 NOTE — Telephone Encounter (Signed)
Pt came in and stated he needed a refill on his cholesterol medication. He did not know the name of the medication but it was his cholesterol. Pharmacy is [facility] [place].

## 2017-01-29 ENCOUNTER — Encounter: Payer: Self-pay | Admitting: *Deleted

## 2017-02-01 ENCOUNTER — Telehealth: Payer: Self-pay | Admitting: Family Medicine

## 2017-02-01 MED ORDER — ROSUVASTATIN CALCIUM 20 MG PO TABS
20.0000 mg | ORAL_TABLET | Freq: Every day | ORAL | 2 refills | Status: DC
Start: 2017-02-01 — End: 2017-03-03

## 2017-02-01 NOTE — Telephone Encounter (Signed)
Patient has had problems since starting on the cholesterol med.  He said he did not experience these symptoms such as leg swelling, nausea until he started this med.  He said today he did not take this.  Please advise  Thanks

## 2017-02-01 NOTE — Telephone Encounter (Signed)
From OV on 01/05/17, "Patient's cholesterol is back up with history of MI and aortic atherosclerosis and diabetes will restart medication reviewed cholesterol medication and side effects. Patient previously took simvastatin with myalgias will avoid that. Will start atorvastatin 20 mg and follow-up in 3 months to reassess."  Please advise.

## 2017-02-01 NOTE — Telephone Encounter (Signed)
Will change medicine to Crestor

## 2017-02-16 DIAGNOSIS — L738 Other specified follicular disorders: Secondary | ICD-10-CM | POA: Diagnosis not present

## 2017-02-16 DIAGNOSIS — L281 Prurigo nodularis: Secondary | ICD-10-CM | POA: Diagnosis not present

## 2017-02-22 DIAGNOSIS — M5416 Radiculopathy, lumbar region: Secondary | ICD-10-CM | POA: Diagnosis not present

## 2017-02-22 DIAGNOSIS — M533 Sacrococcygeal disorders, not elsewhere classified: Secondary | ICD-10-CM | POA: Diagnosis not present

## 2017-02-22 DIAGNOSIS — M47817 Spondylosis without myelopathy or radiculopathy, lumbosacral region: Secondary | ICD-10-CM | POA: Diagnosis not present

## 2017-02-22 DIAGNOSIS — M791 Myalgia: Secondary | ICD-10-CM | POA: Diagnosis not present

## 2017-03-02 ENCOUNTER — Telehealth: Payer: Self-pay | Admitting: Family Medicine

## 2017-03-02 NOTE — Telephone Encounter (Signed)
Patient called to let Dr Jeananne Rama know he would be stopping his last cholesterol med he was put on because it is causing his legs to swell and also shortness of breath.  I told him I would send the message.  I explained Dr Jeananne Rama will not be back until next week.  Thanks  [phone] Fritz Pickerel

## 2017-03-03 ENCOUNTER — Other Ambulatory Visit: Payer: Self-pay | Admitting: Family Medicine

## 2017-03-03 NOTE — Telephone Encounter (Signed)
Please have him stop the medicine and Dr. Jeananne Rama will call him about alternatives when he returns. (please send this back to Dr. Rance Muir box so that he can do this)

## 2017-03-03 NOTE — Telephone Encounter (Signed)
Left message on machine for pt to return call to the office.  

## 2017-03-03 NOTE — Telephone Encounter (Signed)
Pt on Crestor 20 mg.  Please advise.   Lab Results  Component Value Date   CHOL 221 (H) 01/05/2017   HDL 49 03/25/2016   LDLCALC 126 (H) 03/25/2016   TRIG 149 01/05/2017   CHOLHDL 3.1 09/23/2015

## 2017-03-08 ENCOUNTER — Other Ambulatory Visit: Payer: Self-pay

## 2017-03-08 MED ORDER — EMPAGLIFLOZIN 25 MG PO TABS: 25.0000 mg | ORAL_TABLET | Freq: Every day | ORAL | 1 refills | Status: DC

## 2017-03-08 NOTE — Telephone Encounter (Signed)
[facility] sent RX request for 90 day supply of Jardiance 25 mg daily. T'd up. RX did show up as w/o provider's name as if it has not been written here before. Please advise.

## 2017-03-08 NOTE — Telephone Encounter (Signed)
Call pt 

## 2017-03-08 NOTE — Telephone Encounter (Signed)
Please see messages. I have been unable to relay last message to pt yet, he has not answered

## 2017-03-09 NOTE — Telephone Encounter (Signed)
Left message on machine for pt to return call to the office.  

## 2017-03-11 NOTE — Telephone Encounter (Signed)
Phone call Discussed with patient cholesterol issues holding cholesterol for 2 weeks restarting and observing symptoms.  Patient also relates 5 days ago felt like he is having a heart attack passed out for a couple hours and did not go to the emergency room or have any other medical care. Patient states is coming in here tomorrow. States he feels okay today.

## 2017-03-12 ENCOUNTER — Ambulatory Visit (INDEPENDENT_AMBULATORY_CARE_PROVIDER_SITE_OTHER): Payer: Medicare Other | Admitting: Family Medicine

## 2017-03-12 ENCOUNTER — Other Ambulatory Visit: Payer: Self-pay | Admitting: Family Medicine

## 2017-03-12 ENCOUNTER — Ambulatory Visit (INDEPENDENT_AMBULATORY_CARE_PROVIDER_SITE_OTHER): Payer: Medicare Other

## 2017-03-12 VITALS — BP 120/80 | HR 68 | Temp 98.3°F | Resp 16 | Ht 68.0 in | Wt 218.0 lb

## 2017-03-12 DIAGNOSIS — R300 Dysuria: Secondary | ICD-10-CM | POA: Diagnosis not present

## 2017-03-12 DIAGNOSIS — Z0000 Encounter for general adult medical examination without abnormal findings: Secondary | ICD-10-CM | POA: Diagnosis not present

## 2017-03-12 DIAGNOSIS — R079 Chest pain, unspecified: Secondary | ICD-10-CM | POA: Diagnosis not present

## 2017-03-12 LAB — UA/M W/RFLX CULTURE, ROUTINE
Bilirubin, UA: NEGATIVE
KETONES UA: NEGATIVE
LEUKOCYTES UA: NEGATIVE
Nitrite, UA: NEGATIVE
PROTEIN UA: NEGATIVE
RBC, UA: NEGATIVE
SPEC GRAV UA: 1.025 (ref 1.005–1.030)
Urobilinogen, Ur: 0.2 mg/dL (ref 0.2–1.0)
pH, UA: 5 (ref 5.0–7.5)

## 2017-03-12 LAB — MICROSCOPIC EXAMINATION
Bacteria, UA: NONE SEEN
RBC, UA: NONE SEEN /hpf (ref 0–?)
WBC, UA: NONE SEEN /hpf (ref 0–?)

## 2017-03-12 NOTE — Assessment & Plan Note (Signed)
EKG normal today, no change from prior. Still in pain and SOB. Call made to his cardiologist- not in the office today. Advised patient that he should go to ER for further evaluation likely including CXR and troponins given Holiday weekend. He agreed to go and his wife will drive him. Call made to [facility] letting them know that he's on his way.

## 2017-03-12 NOTE — Progress Notes (Signed)
BP 120/80 (BP Location: Left Arm, Patient Position: Sitting)   Pulse 68   Temp 98.3 F (36.8 C)   Resp 16   Ht 5\' 8"  (1.727 m)   Wt 218 lb (98.9 kg)   BMI 33.15 kg/m    Subjective:    Patient ID: Edward Flesher., male    DOB: [birth year], 61 y.o.   MRN: [id number]  HPI: Edward Rosenberg. is a 61 y.o. male  Chief Complaint  Patient presents with  . Chest Pain  . Dysuria   Edward Schneider was here today for his Medicare Wellness Exam with the Nurse Health Advisor. He notes that he has had several concerning issues so an acute appointment was made with me. Patient stated he had lower back pain on Sunday 5/20 with dysuria.   URINARY SYMPTOMS Duration: 5 days Dysuria: yes Urinary frequency: no Urgency: no Small volume voids: yes Symptom severity: moderate Urinary incontinence: yes Foul odor: yes Hematuria: no Abdominal pain: yes Back pain: yes Suprapubic pain/pressure: no Flank pain: yes Fever:  no Vomiting: yes- not since Wednesday Relief with cranberry juice: no Relief with pyridium: no Status: better/worse/stable Previous urinary tract infection: no Penile discharge: no Treatments attempted: none   CHEST PAIN- States the pain then radiated to his chest and he felt like he had to use the bathroom and then started sweating profusely and blacked out for 2.5 hours. States when he woke up he had numbness in his fingers and hands. The chest pain (described as tightness)has been intermittent for the last several days and has SOB at times. Denies any pain in chest or lower back at present.  Follows with Dr. Nehemiah Massed for his cardiology. Last saw him in November for syncope and had extensive work up which was negative. Due to see him in March, but hasn't yet, had asymptomatic bradycardia on Holter, Negative Stress Test, mild carotid atherosclerosis, and  Normal ECHO per last cardiology note Time since onset: Duration: 5 days ago Onset: sudden Quality: tightness Severity:  severe Location: left para substernal Radiation: none Episode duration: 2.5 hours unconscious, has been having chest pain on and off since then  Frequency: constant all day today Related to exertion: no Activity when pain started: going to the bathroom Trauma: no Anxiety/recent stressors: no Aggravating factors: nothing Alleviating factors: staying still Status: stable Treatments attempted: nothing  Current pain status: in pain Shortness of breath: yes Cough: yes, non-productive Nausea: yes- no vomiting since Wednesday Diaphoresis: yes Heartburn: no Palpitations: no  Relevant past medical, surgical, family and social history reviewed and updated as indicated. Interim medical history since our last visit reviewed. Allergies and medications reviewed and updated.  Review of Systems  Constitutional: Positive for diaphoresis. Negative for activity change, appetite change, chills, fatigue, fever and unexpected weight change.  HENT: Negative.   Respiratory: Positive for cough, chest tightness and shortness of breath. Negative for apnea, choking, wheezing and stridor.   Cardiovascular: Positive for chest pain. Negative for palpitations and leg swelling.  Gastrointestinal: Positive for abdominal pain, diarrhea and nausea. Negative for abdominal distention, anal bleeding, blood in stool, constipation, rectal pain and vomiting.  Genitourinary: Positive for decreased urine volume, difficulty urinating, flank pain and urgency. Negative for discharge, dysuria, enuresis, frequency, genital sores, hematuria, penile pain, penile swelling, scrotal swelling and testicular pain.  Neurological: Positive for syncope and numbness. Negative for dizziness, tremors, seizures, facial asymmetry, speech difficulty, weakness, light-headedness and headaches.  Psychiatric/Behavioral: Negative.     Per HPI unless specifically  indicated above     Objective:    BP 120/80 (BP Location: Left Arm, Patient  Position: Sitting)   Pulse 68   Temp 98.3 F (36.8 C)   Resp 16   Ht 5\' 8"  (1.727 m)   Wt 218 lb (98.9 kg)   BMI 33.15 kg/m   Wt Readings from Last 3 Encounters:  03/12/17 218 lb (98.9 kg)  03/12/17 218 lb (98.9 kg)  01/11/17 219 lb 11.2 oz (99.7 kg)    Physical Exam  Constitutional: He is oriented to person, place, and time. He appears well-developed and well-nourished. No distress.  HENT:  Head: Normocephalic and atraumatic.  Right Ear: Hearing normal.  Left Ear: Hearing normal.  Nose: Nose normal.  Eyes: Conjunctivae and lids are normal. Right eye exhibits no discharge. Left eye exhibits no discharge. No scleral icterus.  Cardiovascular: Normal rate, regular rhythm, normal heart sounds and intact distal pulses.  Exam reveals no gallop and no friction rub.   No murmur heard. Pulmonary/Chest: Effort normal and breath sounds normal. No respiratory distress. He has no wheezes. He has no rales. He exhibits no tenderness.  Abdominal: Soft. Bowel sounds are normal. He exhibits no distension and no mass. There is no tenderness. There is no rebound and no guarding.  Musculoskeletal: Normal range of motion.  Neurological: He is alert and oriented to person, place, and time.  Skin: Skin is warm, dry and intact. No rash noted. He is not diaphoretic. No erythema. No pallor.  Psychiatric: He has a normal mood and affect. His speech is normal and behavior is normal. Judgment and thought content normal. Cognition and memory are normal.  Nursing note and vitals reviewed.   Results for orders placed or performed in visit on 95/62/13  Basic metabolic panel  Result Value Ref Range   Glucose 149 (H) 65 - 99 mg/dL   BUN 11 6 - 24 mg/dL   Creatinine, Ser 0.91 0.76 - 1.27 mg/dL   GFR calc non Af Amer 92 >59 mL/min/1.73   GFR calc Af Amer 106 >59 mL/min/1.73   BUN/Creatinine Ratio 12 9 - 20   Sodium 140 134 - 144 mmol/L   Potassium 4.5 3.5 - 5.2 mmol/L   Chloride 102 96 - 106 mmol/L   CO2 21  18 - 29 mmol/L   Calcium 9.4 8.7 - 10.2 mg/dL  LP+ALT+AST Piccolo, Waived  Result Value Ref Range   ALT (SGPT) Piccolo, Waived 33 10 - 47 U/L   AST (SGOT) Piccolo, Waived 30 11 - 38 U/L   Cholesterol Piccolo, Waived 221 (H) <200 mg/dL   HDL Chol Piccolo, Waived 58 >59 mg/dL   Triglycerides Piccolo,Waived 149 <150 mg/dL   Chol/HDL Ratio Piccolo,Waive 3.8 mg/dL   LDL Chol Calc Piccolo Waived 134 (H) <100 mg/dL   VLDL Chol Calc Piccolo,Waive 30 (H) <30 mg/dL  Hemoglobin A1c  Result Value Ref Range   Hgb A1c MFr Bld 6.8 (H) 4.8 - 5.6 %   Est. average glucose Bld gHb Est-mCnc 148 mg/dL  Microalbumin, Urine Waived  Result Value Ref Range   Microalb, Ur Waived 10 0 - 19 mg/L   Creatinine, Urine Waived 100 10 - 300 mg/dL   Microalb/Creat Ratio <30 <30 mg/g      Assessment & Plan:   Problem List Items Addressed This Visit      Other   Chest pain - Primary    EKG normal today, no change from prior. Still in pain and SOB. Call made to  his cardiologist- not in the office today. Advised patient that he should go to ER for further evaluation likely including CXR and troponins given Holiday weekend. He agreed to go and his wife will drive him. Call made to [facility] letting them know that he's on his way.        Relevant Orders   EKG 12-Lead (Completed)   RESOLVED: Dysuria   Relevant Orders   UA/M w/rflx Culture, Routine     EKG with no change from prior   Follow up plan: Return As scheduled/After ER visit.

## 2017-03-12 NOTE — Patient Instructions (Signed)
Edward Schneider , Thank you for taking time to come for your Medicare Wellness Visit. I appreciate your ongoing commitment to your health goals. Please review the following plan we discussed and let me know if I can assist you in the future.   Screening recommendations/referrals: Colonoscopy: Completed 03/06/2016, due 02/2026 Recommended yearly ophthalmology/optometry visit for glaucoma screening and checkup Recommended yearly dental visit for hygiene and checkup  Vaccinations: Influenza vaccine: Up to date, due 07/2017 Pneumococcal vaccine: up to date  Tdap vaccine: up to date, due 10/2017 Shingles vaccine: due, check with your insurance company for coverage  Advanced directives: Advance directive discussed with you today. I have provided a copy for you to complete at home and have notarized. Once this is complete please bring a copy in to our office so we can scan it into your chart.  Conditions/risks identified: Recommend drinking at least 4-5 glasses of water a day.   Next appointment: Follow up with Dr.Crissman on 03/29/2017. Follow up in one year for your annual wellness exam.   Preventive Care 40-64 Years, Male Preventive care refers to lifestyle choices and visits with your health care provider that can promote health and wellness. What does preventive care include?  A yearly physical exam. This is also called an annual well check.  Dental exams once or twice a year.  Routine eye exams. Ask your health care provider how often you should have your eyes checked.  Personal lifestyle choices, including:  Daily care of your teeth and gums.  Regular physical activity.  Eating a healthy diet.  Avoiding tobacco and drug use.  Limiting alcohol use.  Practicing safe sex.  Taking low-dose aspirin every day starting at age 15. What happens during an annual well check? The services and screenings done by your health care provider during your annual well check will depend on your age,  overall health, lifestyle risk factors, and family history of disease. Counseling  Your health care provider may ask you questions about your:  Alcohol use.  Tobacco use.  Drug use.  Emotional well-being.  Home and relationship well-being.  Sexual activity.  Eating habits.  Work and work Statistician. Screening  You may have the following tests or measurements:  Height, weight, and BMI.  Blood pressure.  Lipid and cholesterol levels. These may be checked every 5 years, or more frequently if you are over 105 years old.  Skin check.  Lung cancer screening. You may have this screening every year starting at age 76 if you have a 30-pack-year history of smoking and currently smoke or have quit within the past 15 years.  Fecal occult blood test (FOBT) of the stool. You may have this test every year starting at age 76.  Flexible sigmoidoscopy or colonoscopy. You may have a sigmoidoscopy every 5 years or a colonoscopy every 10 years starting at age 24.  Prostate cancer screening. Recommendations will vary depending on your family history and other risks.  Hepatitis C blood test.  Hepatitis B blood test.  Sexually transmitted disease (STD) testing.  Diabetes screening. This is done by checking your blood sugar (glucose) after you have not eaten for a while (fasting). You may have this done every 1-3 years. Discuss your test results, treatment options, and if necessary, the need for more tests with your health care provider. Vaccines  Your health care provider may recommend certain vaccines, such as:  Influenza vaccine. This is recommended every year.  Tetanus, diphtheria, and acellular pertussis (Tdap, Td) vaccine. You may  need a Td booster every 10 years.  Zoster vaccine. You may need this after age 64.  Pneumococcal 13-valent conjugate (PCV13) vaccine. You may need this if you have certain conditions and have not been vaccinated.  Pneumococcal polysaccharide (PPSV23)  vaccine. You may need one or two doses if you smoke cigarettes or if you have certain conditions. Talk to your health care provider about which screenings and vaccines you need and how often you need them. This information is not intended to replace advice given to you by your health care provider. Make sure you discuss any questions you have with your health care provider. Document Released: 11/01/2015 Document Revised: 06/24/2016 Document Reviewed: 08/06/2015 Elsevier Interactive Patient Education  2017 [employer] Prevention in the Home Falls can cause injuries. They can happen to people of all ages. There are many things you can do to make your home safe and to help prevent falls. What can I do on the outside of my home?  Regularly fix the edges of walkways and driveways and fix any cracks.  Remove anything that might make you trip as you walk through a door, such as a raised step or threshold.  Trim any bushes or trees on the path to your home.  Use bright outdoor lighting.  Clear any walking paths of anything that might make someone trip, such as rocks or tools.  Regularly check to see if handrails are loose or broken. Make sure that both sides of any steps have handrails.  Any raised decks and porches should have guardrails on the edges.  Have any leaves, snow, or ice cleared regularly.  Use sand or salt on walking paths during winter.  Clean up any spills in your garage right away. This includes oil or grease spills. What can I do in the bathroom?  Use night lights.  Install grab bars by the toilet and in the tub and shower. Do not use towel bars as grab bars.  Use non-skid mats or decals in the tub or shower.  If you need to sit down in the shower, use a plastic, non-slip stool.  Keep the floor dry. Clean up any water that spills on the floor as soon as it happens.  Remove soap buildup in the tub or shower regularly.  Attach bath mats securely with  double-sided non-slip rug tape.  Do not have throw rugs and other things on the floor that can make you trip. What can I do in the bedroom?  Use night lights.  Make sure that you have a light by your bed that is easy to reach.  Do not use any sheets or blankets that are too big for your bed. They should not hang down onto the floor.  Have a firm chair that has side arms. You can use this for support while you get dressed.  Do not have throw rugs and other things on the floor that can make you trip. What can I do in the kitchen?  Clean up any spills right away.  Avoid walking on wet floors.  Keep items that you use a lot in easy-to-reach places.  If you need to reach something above you, use a strong step stool that has a grab bar.  Keep electrical cords out of the way.  Do not use floor polish or wax that makes floors slippery. If you must use wax, use non-skid floor wax.  Do not have throw rugs and other things on the floor that can  make you trip. What can I do with my stairs?  Do not leave any items on the stairs.  Make sure that there are handrails on both sides of the stairs and use them. Fix handrails that are broken or loose. Make sure that handrails are as long as the stairways.  Check any carpeting to make sure that it is firmly attached to the stairs. Fix any carpet that is loose or worn.  Avoid having throw rugs at the top or bottom of the stairs. If you do have throw rugs, attach them to the floor with carpet tape.  Make sure that you have a light switch at the top of the stairs and the bottom of the stairs. If you do not have them, ask someone to add them for you. What else can I do to help prevent falls?  Wear shoes that:  Do not have high heels.  Have rubber bottoms.  Are comfortable and fit you well.  Are closed at the toe. Do not wear sandals.  If you use a stepladder:  Make sure that it is fully opened. Do not climb a closed stepladder.  Make  sure that both sides of the stepladder are locked into place.  Ask someone to hold it for you, if possible.  Clearly mark and make sure that you can see:  Any grab bars or handrails.  First and last steps.  Where the edge of each step is.  Use tools that help you move around (mobility aids) if they are needed. These include:  Canes.  Walkers.  Scooters.  Crutches.  Turn on the lights when you go into a dark area. Replace any light bulbs as soon as they burn out.  Set up your furniture so you have a clear path. Avoid moving your furniture around.  If any of your floors are uneven, fix them.  If there are any pets around you, be aware of where they are.  Review your medicines with your doctor. Some medicines can make you feel dizzy. This can increase your chance of falling. Ask your doctor what other things that you can do to help prevent falls. This information is not intended to replace advice given to you by your health care provider. Make sure you discuss any questions you have with your health care provider. Document Released: 08/01/2009 Document Revised: 03/12/2016 Document Reviewed: 11/09/2014 Elsevier Interactive Patient Education  2017 [employer].

## 2017-03-12 NOTE — Progress Notes (Signed)
Subjective:   Edward Schneider. is a 61 y.o. male who presents for Medicare Annual/Subsequent preventive examination.  Review of Systems:  Cardiac Risk Factors include: male gender;advanced age (>55men, >61 women);diabetes mellitus;obesity (BMI >30kg/m2)     Objective:    Vitals: BP 120/80 (BP Location: Left Arm, Patient Position: Sitting)   Pulse 68   Temp 98.3 F (36.8 C)   Resp 16   Ht 5\' 8"  (1.727 m)   Wt 218 lb (98.9 kg)   BMI 33.15 kg/m   Body mass index is 33.15 kg/m.  Tobacco History  Smoking Status  . Former Smoker  . Packs/day: 2.00  . Years: 50.00  . Types: Cigarettes  . Quit date: 07/05/2015  Smokeless Tobacco  . Never Used     Counseling given: Not Answered   Past Medical History:  Diagnosis Date  . Allergy   . Asthma   . C. difficile diarrhea   . Cancer [facility]) June 2016   liver cancer  . Chronic pain   . DDD (degenerative disc disease), cervical   . DDD (degenerative disc disease), lumbar   . Diabetes mellitus without complication ([place])   . GERD (gastroesophageal reflux disease)   . Headache    migraines - none since 02/17  . Hypertension   . Low blood sugar   . Myocardial infarction ([place])   . Stroke [facility])    'mini-stroke" 30 yrs ago. no deficits.  . Wears dentures    full upper and lower   Past Surgical History:  Procedure Laterality Date  . APPENDECTOMY    . BACK SURGERY    . COLONOSCOPY WITH PROPOFOL N/A 03/06/2016   Procedure: COLONOSCOPY WITH PROPOFOL;  Surgeon: Lucilla Lame, MD;  Location: [place];  Service: Endoscopy;  Laterality: N/A;  requests early  . FINGER SURGERY Left   . KNEE SURGERY Right   . NECK SURGERY    . spleen surg    . spleen surgery    . TOE SURGERY Right    Family History  Problem Relation Age of Onset  . Arthritis Mother   . Diabetes Mother   . Kidney disease Mother   . Heart disease Mother   . Hypertension Mother   . Arthritis Father   . Hearing loss Father   . Hypertension Father   .  Diabetes Sister   . Heart disease Sister   . Diabetes Daughter   . Diabetes Maternal Aunt   . Diabetes Maternal Grandmother    History  Sexual Activity  . Sexual activity: Not on file    Outpatient Encounter Prescriptions as of 03/12/2017  Medication Sig  . aspirin EC 81 MG tablet Take 81 mg by mouth daily.  . Aspirin-Acetaminophen-Caffeine (EXCEDRIN MIGRAINE PO) Take by mouth.  . diphenhydrAMINE (BENADRYL) 25 mg capsule Take 25 mg by mouth 2 (two) times daily.   . diphenoxylate-atropine (LOMOTIL) 2.5-0.025 MG tablet Take 1 tablet by mouth 4 (four) times daily as needed for diarrhea or loose stools.  . DULoxetine (CYMBALTA) 20 MG capsule Take 1 capsule (20 mg total) by mouth daily. (Patient taking differently: Take 30 mg by mouth daily. )  . empagliflozin (JARDIANCE) 25 MG TABS tablet Take 25 mg by mouth daily.  . metFORMIN (GLUCOPHAGE) 500 MG tablet Take 2 tablets (1,000 mg total) by mouth 2 (two) times daily with a meal.  . omeprazole (PRILOSEC OTC) 20 MG tablet Take 20 mg by mouth 2 (two) times daily.  Marland Kitchen oxyCODONE (ROXICODONE) 5  MG immediate release tablet Limit one tablet by mouth 3-6  times per day if tolerated (Patient taking differently: Limit one tablet by mouth 3-6  times per day if tolerated, pt states he is taking 6 tablets daily)  . traZODone (DESYREL) 100 MG tablet Take 1 tablet (100 mg total) by mouth at bedtime as needed for sleep.  Marland Kitchen triamcinolone cream (KENALOG) 0.1 % Apply 1 application topically 2 (two) times daily.  . canagliflozin (INVOKANA) 300 MG TABS tablet Take 1 tablet (300 mg total) by mouth daily before breakfast.   No facility-administered encounter medications on file as of 03/12/2017.     Activities of Daily Living In your present state of health, do you have any difficulty performing the following activities: 03/12/2017  Hearing? Y  Vision? Y  Difficulty concentrating or making decisions? N  Walking or climbing stairs? Y  Dressing or bathing? N  Doing  errands, shopping? N  Preparing Food and eating ? N  Using the Toilet? N  In the past six months, have you accidently leaked urine? N  Do you have problems with loss of bowel control? N  Managing your Medications? N  Managing your Finances? N  Housekeeping or managing your Housekeeping? N  Some recent data might be hidden    Patient Care Team: Guadalupe Maple, MD as PCP - General (Family Medicine)   Assessment:     Exercise Activities and Dietary recommendations Current Exercise Habits: Home exercise routine, Type of exercise: walking, Time (Minutes): 45, Frequency (Times/Week): 5, Weekly Exercise (Minutes/Week): 225, Intensity: Mild  Goals    . Increase water intake          Recommend drinking at least 4-5 glasses of water a day.       Fall Risk Fall Risk  03/12/2017 01/11/2017 11/06/2016 11/02/2016 10/26/2016  Falls in the past year? Yes Yes Yes (No Data) Yes  Number falls in past yr: - - 2 or more - -  Injury with Fall? No - No - -  Risk Factor Category  - - High Fall Risk - -  Risk for fall due to : - - History of fall(s) - -  Risk for fall due to (comments): - - - - -  Follow up - - - - -   Depression Screen PHQ 2/9 Scores 03/12/2017 03/12/2017 10/23/2016 06/15/2016  PHQ - 2 Score 2 0 6 0  PHQ- 9 Score 7 - 15 -  Exception Documentation - - - -    Cognitive Function        Immunization History  Administered Date(s) Administered  . Influenza,inj,Quad PF,36+ Mos 08/21/2015, 09/24/2016  . Influenza-Unspecified 07/19/2014  . Pneumococcal Polysaccharide-23 06/07/2012  . Td 10/28/2007   Screening Tests Health Maintenance  Topic Date Due  . HIV Screening  03/19/2017 (Originally 03/28/1971)  . OPHTHALMOLOGY EXAM  03/29/2017 (Originally [birth year]/29/1967)  . INFLUENZA VACCINE  05/19/2017  . PNEUMOCOCCAL POLYSACCHARIDE VACCINE (2) 06/07/2017  . HEMOGLOBIN A1C  07/08/2017  . TETANUS/TDAP  10/27/2017  . FOOT EXAM  01/05/2018  . URINE MICROALBUMIN  01/05/2018  . COLONOSCOPY   03/06/2026  . Hepatitis C Screening  Addressed      Plan:    I have personally reviewed and addressed the Medicare Annual Wellness questionnaire and have noted the following in the patient's chart:  A. Medical and social history B. Use of alcohol, tobacco or illicit drugs  C. Current medications and supplements D. Functional ability and status E.  Nutritional status F.  Physical activity G. Advance directives H. List of other physicians I.  Hospitalizations, surgeries, and ER visits in previous 12 months J.  [place] such as hearing and vision if needed, cognitive and depression L. Referrals and appointments - Appt 03/29/2017 at 9 am with Dr.Crissman, pt to make diabetic eye exam appt.   In addition, I have reviewed and discussed with patient certain preventive protocols, quality metrics, and best practice recommendations. A written personalized care plan for preventive services as well as general preventive health recommendations were provided to patient.   Signed,  Tyler Aas, LPN Nurse Health Advisor   MD Recommendations: Patient stated he had lower back pain on Sunday 5/20 with dysuria. States the pain then radiated to his chest and then started sweating and blacked out for 2 hours. States when he woke up he had numbness in his fingers and hands. The chest pain (described as tightness)has been intermittent for the last several days and has SOB at times. Denies any pain in chest or lower back at present. - Dr.Johnson will see patient today.  I, as supervising physician, have reviewed the nurse health advisors Medicare wellness visit note for this patient and concur with the findings and recommendations listed above. Golden Pop M.D.

## 2017-03-22 DIAGNOSIS — M5031 Other cervical disc degeneration,  high cervical region: Secondary | ICD-10-CM | POA: Diagnosis not present

## 2017-03-22 DIAGNOSIS — M5416 Radiculopathy, lumbar region: Secondary | ICD-10-CM | POA: Diagnosis not present

## 2017-03-22 DIAGNOSIS — M542 Cervicalgia: Secondary | ICD-10-CM | POA: Diagnosis not present

## 2017-03-22 DIAGNOSIS — M5033 Other cervical disc degeneration, cervicothoracic region: Secondary | ICD-10-CM | POA: Diagnosis not present

## 2017-03-22 DIAGNOSIS — M533 Sacrococcygeal disorders, not elsewhere classified: Secondary | ICD-10-CM | POA: Diagnosis not present

## 2017-03-22 DIAGNOSIS — M47817 Spondylosis without myelopathy or radiculopathy, lumbosacral region: Secondary | ICD-10-CM | POA: Diagnosis not present

## 2017-03-22 DIAGNOSIS — Z79891 Long term (current) use of opiate analgesic: Secondary | ICD-10-CM | POA: Diagnosis not present

## 2017-03-22 DIAGNOSIS — M791 Myalgia: Secondary | ICD-10-CM | POA: Diagnosis not present

## 2017-03-22 DIAGNOSIS — M5136 Other intervertebral disc degeneration, lumbar region: Secondary | ICD-10-CM | POA: Diagnosis not present

## 2017-03-29 ENCOUNTER — Ambulatory Visit (INDEPENDENT_AMBULATORY_CARE_PROVIDER_SITE_OTHER): Payer: Medicare Other | Admitting: Family Medicine

## 2017-03-29 ENCOUNTER — Encounter: Payer: Self-pay | Admitting: Family Medicine

## 2017-03-29 VITALS — BP 134/86 | HR 62 | Ht 68.5 in | Wt 221.8 lb

## 2017-03-29 DIAGNOSIS — E7800 Pure hypercholesterolemia, unspecified: Secondary | ICD-10-CM | POA: Diagnosis not present

## 2017-03-29 DIAGNOSIS — J449 Chronic obstructive pulmonary disease, unspecified: Secondary | ICD-10-CM

## 2017-03-29 DIAGNOSIS — I700 Atherosclerosis of aorta: Secondary | ICD-10-CM

## 2017-03-29 DIAGNOSIS — Z7189 Other specified counseling: Secondary | ICD-10-CM | POA: Diagnosis not present

## 2017-03-29 DIAGNOSIS — Z125 Encounter for screening for malignant neoplasm of prostate: Secondary | ICD-10-CM | POA: Diagnosis not present

## 2017-03-29 DIAGNOSIS — E1142 Type 2 diabetes mellitus with diabetic polyneuropathy: Secondary | ICD-10-CM | POA: Diagnosis not present

## 2017-03-29 DIAGNOSIS — Z0000 Encounter for general adult medical examination without abnormal findings: Secondary | ICD-10-CM

## 2017-03-29 DIAGNOSIS — E119 Type 2 diabetes mellitus without complications: Secondary | ICD-10-CM

## 2017-03-29 DIAGNOSIS — M5136 Other intervertebral disc degeneration, lumbar region: Secondary | ICD-10-CM | POA: Diagnosis not present

## 2017-03-29 DIAGNOSIS — Z1329 Encounter for screening for other suspected endocrine disorder: Secondary | ICD-10-CM | POA: Diagnosis not present

## 2017-03-29 LAB — BAYER DCA HB A1C WAIVED: HB A1C (BAYER DCA - WAIVED): 6.2 % (ref <7.0)

## 2017-03-29 MED ORDER — DULOXETINE HCL 30 MG PO CPEP: 30.0000 mg | ORAL_CAPSULE | Freq: Two times a day (BID) | ORAL | 12 refills | Status: DC

## 2017-03-29 MED ORDER — METFORMIN HCL 500 MG PO TABS: 1000.0000 mg | ORAL_TABLET | Freq: Two times a day (BID) | ORAL | 4 refills | Status: DC

## 2017-03-29 MED ORDER — EMPAGLIFLOZIN 25 MG PO TABS: 25.0000 mg | ORAL_TABLET | Freq: Every day | ORAL | 4 refills | Status: DC

## 2017-03-29 MED ORDER — CANAGLIFLOZIN 300 MG PO TABS: 300.0000 mg | ORAL_TABLET | Freq: Every day | ORAL | 12 refills | Status: DC

## 2017-03-29 MED ORDER — TRAZODONE HCL 100 MG PO TABS: 100.0000 mg | ORAL_TABLET | Freq: Every evening | ORAL | 4 refills | Status: DC | PRN

## 2017-03-29 NOTE — Assessment & Plan Note (Signed)
A voluntary discussion about advance care planning including the explanation and discussion of advance directives was extensively discussed  with the patient.  Explanation about the health care proxy and Living will was reviewed and packet with forms with explanation of how to fill them out was given.    

## 2017-03-29 NOTE — Assessment & Plan Note (Signed)
The current medical regimen is effective;  continue present plan and medications.  

## 2017-03-29 NOTE — Progress Notes (Signed)
BP 134/86   Pulse 62   Ht 5' 8.5" (1.74 m)   Wt 221 lb 12.8 oz (100.6 kg)   SpO2 99%   BMI 33.23 kg/m    Subjective:    Patient ID: Edward Flesher., male    DOB: 10-20-1955, 61 y.o.   MRN: [id number]  HPI: Edward Schneider. is a 61 y.o. male  Chief Complaint  Patient presents with  . Annual Exam  Patients with a great deal of stress going on with grandson moving out. Patient never did go to the emergency room as recommended in last note. Patient's done okay since that time. He attributes these spells he had to heavy stress. Patient now doing okay with no further syncopal episodes or symptoms. Otherwise patient doing okay. Followed by pain management for oxycodone doing okay. Diabetes good control no low blood sugar spells.  Relevant past medical, surgical, family and social history reviewed and updated as indicated. Interim medical history since our last visit reviewed. Allergies and medications reviewed and updated.  Review of Systems  Constitutional: Negative.   HENT: Negative.   Eyes: Negative.   Respiratory: Negative.   Cardiovascular: Negative.   Gastrointestinal: Negative.   Endocrine: Negative.   Genitourinary: Negative.   Musculoskeletal: Negative.   Skin: Negative.   Allergic/Immunologic: Negative.   Neurological: Negative.   Hematological: Negative.   Psychiatric/Behavioral: Negative.     Per HPI unless specifically indicated above     Objective:    BP 134/86   Pulse 62   Ht 5' 8.5" (1.74 m)   Wt 221 lb 12.8 oz (100.6 kg)   SpO2 99%   BMI 33.23 kg/m   Wt Readings from Last 3 Encounters:  03/29/17 221 lb 12.8 oz (100.6 kg)  03/12/17 218 lb (98.9 kg)  03/12/17 218 lb (98.9 kg)    Physical Exam  Constitutional: He is oriented to person, place, and time. He appears well-developed and well-nourished.  HENT:  Head: Normocephalic and atraumatic.  Right Ear: External ear normal.  Left Ear: External ear normal.  Eyes: Conjunctivae and EOM are  normal. Pupils are equal, round, and reactive to light.  Neck: Normal range of motion. Neck supple.  Cardiovascular: Normal rate, regular rhythm, normal heart sounds and intact distal pulses.   Pulmonary/Chest: Effort normal and breath sounds normal.  Abdominal: Soft. Bowel sounds are normal. There is no splenomegaly or hepatomegaly.  Genitourinary: Rectum normal and penis normal.  Genitourinary Comments: BPH changes  Musculoskeletal: Normal range of motion.  Neurological: He is alert and oriented to person, place, and time. He has normal reflexes.  Skin: No rash noted. No erythema.  Psychiatric: He has a normal mood and affect. His behavior is normal. Judgment and thought content normal.    Results for orders placed or performed in visit on 03/12/17  Microscopic Examination  Result Value Ref Range   WBC, UA None seen 0 - 5 /hpf   RBC, UA None seen 0 - 2 /hpf   Epithelial Cells (non renal) CANCELED    Bacteria, UA None seen None seen/Few  UA/M w/rflx Culture, Routine  Result Value Ref Range   Specific Gravity, UA 1.025 1.005 - 1.030   pH, UA 5.0 5.0 - 7.5   Color, UA Yellow Yellow   Appearance Ur Clear Clear   Leukocytes, UA Negative Negative   Protein, UA Negative Negative/Trace   Glucose, UA 2+ (A) Negative   Ketones, UA Negative Negative   RBC, UA Negative Negative  Bilirubin, UA Negative Negative   Urobilinogen, Ur 0.2 0.2 - 1.0 mg/dL   Nitrite, UA Negative Negative   Microscopic Examination See below:       Assessment & Plan:   Problem List Items Addressed This Visit      Cardiovascular and Mediastinum   Atherosclerosis of abdominal aorta (HCC)    stable      Relevant Orders   Lipid panel     Respiratory   COPD (chronic obstructive pulmonary disease) ([place])    The current medical regimen is effective;  continue present plan and medications.         Endocrine   DM type 2 with diabetic peripheral neuropathy (HCC)   Relevant Medications   canagliflozin  (INVOKANA) 300 MG TABS tablet   DULoxetine (CYMBALTA) 30 MG capsule   traZODone (DESYREL) 100 MG tablet   empagliflozin (JARDIANCE) 25 MG TABS tablet   metFORMIN (GLUCOPHAGE) 500 MG tablet   Other Relevant Orders   Bayer DCA Hb A1c Waived     Musculoskeletal and Integument   DDD (degenerative disc disease), lumbar    Followed by pain clinic        Other   Hypercholesterolemia   Relevant Orders   CBC with Differential/Platelet   Comprehensive metabolic panel   Bayer DCA Hb A1c Waived   Advanced care planning/counseling discussion    A voluntary discussion about advance care planning including the explanation and discussion of advance directives was extensively discussed  with the patient.  Explanation about the health care proxy and Living will was reviewed and packet with forms with explanation of how to fill them out was given.       Other Visit Diagnoses    Encounter for Medicare annual wellness exam    -  Primary   Prostate cancer screening       Relevant Orders   PSA   Thyroid disorder screen       Relevant Orders   TSH   Type 2 diabetes mellitus without complication, without long-term current use of insulin (HCC)       Relevant Medications   canagliflozin (INVOKANA) 300 MG TABS tablet   empagliflozin (JARDIANCE) 25 MG TABS tablet   metFORMIN (GLUCOPHAGE) 500 MG tablet       Follow up plan: Return in about 3 months (around 06/29/2017) for Hemoglobin A1c.

## 2017-03-29 NOTE — Assessment & Plan Note (Signed)
stable °

## 2017-03-29 NOTE — Assessment & Plan Note (Signed)
Followed by pain clinic.  

## 2017-03-30 ENCOUNTER — Telehealth: Payer: Self-pay | Admitting: Family Medicine

## 2017-03-30 DIAGNOSIS — R7989 Other specified abnormal findings of blood chemistry: Secondary | ICD-10-CM

## 2017-03-30 LAB — COMPREHENSIVE METABOLIC PANEL
ALBUMIN: 4.1 g/dL (ref 3.6–4.8)
ALT: 26 IU/L (ref 0–44)
AST: 21 IU/L (ref 0–40)
Albumin/Globulin Ratio: 1.4 (ref 1.2–2.2)
Alkaline Phosphatase: 65 IU/L (ref 39–117)
BUN / CREAT RATIO: 14 (ref 10–24)
BUN: 12 mg/dL (ref 8–27)
Bilirubin Total: 0.4 mg/dL (ref 0.0–1.2)
CALCIUM: 9.1 mg/dL (ref 8.6–10.2)
CO2: 22 mmol/L (ref 20–29)
CREATININE: 0.85 mg/dL (ref 0.76–1.27)
Chloride: 103 mmol/L (ref 96–106)
GFR calc Af Amer: 109 mL/min/{1.73_m2} (ref >59)
GFR, EST NON AFRICAN AMERICAN: 95 mL/min/{1.73_m2} (ref >59)
GLOBULIN, TOTAL: 2.9 g/dL (ref 1.5–4.5)
GLUCOSE: 115 mg/dL — AB (ref 65–99)
Potassium: 4.1 mmol/L (ref 3.5–5.2)
SODIUM: 138 mmol/L (ref 134–144)
TOTAL PROTEIN: 7 g/dL (ref 6.0–8.5)

## 2017-03-30 LAB — CBC WITH DIFFERENTIAL/PLATELET
BASOS ABS: 0.1 10*3/uL (ref 0.0–0.2)
Basos: 1 %
EOS (ABSOLUTE): 0.2 10*3/uL (ref 0.0–0.4)
EOS: 3 %
HEMATOCRIT: 40.9 % (ref 37.5–51.0)
HEMOGLOBIN: 13.4 g/dL (ref 13.0–17.7)
IMMATURE GRANS (ABS): 0 10*3/uL (ref 0.0–0.1)
IMMATURE GRANULOCYTES: 0 %
LYMPHS ABS: 2.9 10*3/uL (ref 0.7–3.1)
LYMPHS: 39 %
MCH: 28.6 pg (ref 26.6–33.0)
MCHC: 32.8 g/dL (ref 31.5–35.7)
MCV: 87 fL (ref 79–97)
MONOCYTES: 13 %
Monocytes Absolute: 1 10*3/uL — ABNORMAL HIGH (ref 0.1–0.9)
Neutrophils Absolute: 3.3 10*3/uL (ref 1.4–7.0)
Neutrophils: 44 %
Platelets: 203 10*3/uL (ref 150–379)
RBC: 4.68 x10E6/uL (ref 4.14–5.80)
RDW: 15 % (ref 12.3–15.4)
WBC: 7.5 10*3/uL (ref 3.4–10.8)

## 2017-03-30 LAB — LIPID PANEL
CHOL/HDL RATIO: 4.3 ratio (ref 0.0–5.0)
Cholesterol, Total: 196 mg/dL (ref 100–199)
HDL: 46 mg/dL (ref >39)
LDL CALC: 112 mg/dL — AB (ref 0–99)
Triglycerides: 192 mg/dL — ABNORMAL HIGH (ref 0–149)
VLDL CHOLESTEROL CAL: 38 mg/dL (ref 5–40)

## 2017-03-30 LAB — TSH: TSH: 4.91 u[IU]/mL — AB (ref 0.450–4.500)

## 2017-03-30 LAB — PSA: Prostate Specific Ag, Serum: 0.5 ng/mL (ref 0.0–4.0)

## 2017-03-30 NOTE — Telephone Encounter (Signed)
Hone call Discussed with patient TSH slightly elevated we will recheck TSH in 3 months at patient's next visit.

## 2017-04-03 ENCOUNTER — Observation Stay
Admission: EM | Admit: 2017-04-03 | Discharge: 2017-04-05 | Disposition: A | Discharge status: Home / Self Care | Payer: Medicare Other | Attending: Internal Medicine | Admitting: Internal Medicine

## 2017-04-03 ENCOUNTER — Emergency Department: Payer: Medicare Other

## 2017-04-03 ENCOUNTER — Encounter: Payer: Self-pay | Admitting: Emergency Medicine

## 2017-04-03 DIAGNOSIS — Z91030 Bee allergy status: Secondary | ICD-10-CM | POA: Diagnosis not present

## 2017-04-03 DIAGNOSIS — E1142 Type 2 diabetes mellitus with diabetic polyneuropathy: Secondary | ICD-10-CM | POA: Diagnosis present

## 2017-04-03 DIAGNOSIS — Z8673 Personal history of transient ischemic attack (TIA), and cerebral infarction without residual deficits: Secondary | ICD-10-CM | POA: Diagnosis not present

## 2017-04-03 DIAGNOSIS — G4700 Insomnia, unspecified: Secondary | ICD-10-CM | POA: Insufficient documentation

## 2017-04-03 DIAGNOSIS — Z79899 Other long term (current) drug therapy: Secondary | ICD-10-CM | POA: Insufficient documentation

## 2017-04-03 DIAGNOSIS — M5136 Other intervertebral disc degeneration, lumbar region: Secondary | ICD-10-CM | POA: Insufficient documentation

## 2017-04-03 DIAGNOSIS — Z91013 Allergy to seafood: Secondary | ICD-10-CM | POA: Insufficient documentation

## 2017-04-03 DIAGNOSIS — I200 Unstable angina: Secondary | ICD-10-CM | POA: Diagnosis not present

## 2017-04-03 DIAGNOSIS — R079 Chest pain, unspecified: Principal | ICD-10-CM | POA: Diagnosis present

## 2017-04-03 DIAGNOSIS — J439 Emphysema, unspecified: Secondary | ICD-10-CM | POA: Diagnosis present

## 2017-04-03 DIAGNOSIS — I10 Essential (primary) hypertension: Secondary | ICD-10-CM | POA: Diagnosis not present

## 2017-04-03 DIAGNOSIS — E7800 Pure hypercholesterolemia, unspecified: Secondary | ICD-10-CM | POA: Insufficient documentation

## 2017-04-03 DIAGNOSIS — Z885 Allergy status to narcotic agent status: Secondary | ICD-10-CM | POA: Diagnosis not present

## 2017-04-03 DIAGNOSIS — K219 Gastro-esophageal reflux disease without esophagitis: Secondary | ICD-10-CM | POA: Diagnosis not present

## 2017-04-03 DIAGNOSIS — M5030 Other cervical disc degeneration, unspecified cervical region: Secondary | ICD-10-CM | POA: Insufficient documentation

## 2017-04-03 DIAGNOSIS — J449 Chronic obstructive pulmonary disease, unspecified: Secondary | ICD-10-CM | POA: Diagnosis not present

## 2017-04-03 DIAGNOSIS — Z7984 Long term (current) use of oral hypoglycemic drugs: Secondary | ICD-10-CM | POA: Insufficient documentation

## 2017-04-03 DIAGNOSIS — Z7982 Long term (current) use of aspirin: Secondary | ICD-10-CM | POA: Insufficient documentation

## 2017-04-03 DIAGNOSIS — G8929 Other chronic pain: Secondary | ICD-10-CM | POA: Insufficient documentation

## 2017-04-03 DIAGNOSIS — Z87891 Personal history of nicotine dependence: Secondary | ICD-10-CM | POA: Insufficient documentation

## 2017-04-03 DIAGNOSIS — Z888 Allergy status to other drugs, medicaments and biological substances status: Secondary | ICD-10-CM | POA: Insufficient documentation

## 2017-04-03 DIAGNOSIS — E1169 Type 2 diabetes mellitus with other specified complication: Secondary | ICD-10-CM | POA: Diagnosis present

## 2017-04-03 DIAGNOSIS — E785 Hyperlipidemia, unspecified: Secondary | ICD-10-CM | POA: Diagnosis present

## 2017-04-03 LAB — BASIC METABOLIC PANEL
Anion gap: 7 (ref 5–15)
BUN: 13 mg/dL (ref 6–20)
CALCIUM: 9.2 mg/dL (ref 8.9–10.3)
CHLORIDE: 106 mmol/L (ref 101–111)
CO2: 25 mmol/L (ref 22–32)
CREATININE: 1.02 mg/dL (ref 0.61–1.24)
GFR calc non Af Amer: >60 mL/min (ref >60)
GLUCOSE: 135 mg/dL — AB (ref 65–99)
Potassium: 3.7 mmol/L (ref 3.5–5.1)
Sodium: 138 mmol/L (ref 135–145)

## 2017-04-03 LAB — CBC
HCT: 39.2 % — ABNORMAL LOW (ref 40.0–52.0)
Hemoglobin: 13.3 g/dL (ref 13.0–18.0)
MCH: 28.9 pg (ref 26.0–34.0)
MCHC: 34 g/dL (ref 32.0–36.0)
MCV: 85.2 fL (ref 80.0–100.0)
PLATELETS: 199 10*3/uL (ref 150–440)
RBC: 4.61 MIL/uL (ref 4.40–5.90)
RDW: 14.1 % (ref 11.5–14.5)
WBC: 8.4 10*3/uL (ref 3.8–10.6)

## 2017-04-03 LAB — TROPONIN I

## 2017-04-03 LAB — APTT: aPTT: 29 seconds (ref 24–36)

## 2017-04-03 LAB — PROTIME-INR
INR: 0.93
Prothrombin Time: 12.5 seconds (ref 11.4–15.2)

## 2017-04-03 MED ORDER — NITROGLYCERIN IN D5W 200-5 MCG/ML-% IV SOLN
0.0000 ug/min | Freq: Once | INTRAVENOUS | Status: AC
  Administered 2017-04-03: 5 ug/min via INTRAVENOUS
  Filled 2017-04-03: qty 250

## 2017-04-03 MED ORDER — NITROGLYCERIN 0.4 MG SL SUBL
SUBLINGUAL_TABLET | SUBLINGUAL | Status: AC
  Filled 2017-04-03: qty 1

## 2017-04-03 MED ORDER — HEPARIN (PORCINE) IN NACL 100-0.45 UNIT/ML-% IJ SOLN
1250.0000 [IU]/h | INTRAMUSCULAR | Status: DC
  Administered 2017-04-04: 1250 [IU]/h via INTRAVENOUS
  Filled 2017-04-03: qty 250

## 2017-04-03 MED ORDER — NITROGLYCERIN IN D5W 200-5 MCG/ML-% IV SOLN
INTRAVENOUS | Status: AC
  Administered 2017-04-03: 5 ug/min via INTRAVENOUS
  Filled 2017-04-03: qty 250

## 2017-04-03 MED ORDER — HEPARIN BOLUS VIA INFUSION
4000.0000 [IU] | Freq: Once | INTRAVENOUS | Status: AC
  Administered 2017-04-04: 4000 [IU] via INTRAVENOUS
  Filled 2017-04-03: qty 4000

## 2017-04-03 NOTE — ED Triage Notes (Signed)
Pt presents to ED via [facility] with c/o sudden onset L sided chest pain that started at approx 11 this morning and resolved at approx 1600, then returned at 1700. Pt states pain is L sided that radiates to L arm that is pressure in nature, reports vomiting x 2. EMS gave 324 ASA en route, 3 SL nitro that brought 10/10 CP to a 3/10 CP. Pt is alert and oriented on arrival.

## 2017-04-03 NOTE — ED Provider Notes (Signed)
[facility] Emergency Department Provider Note  ____________________________________________  Time seen: Approximately 11:11 PM  I have reviewed the triage vital signs and the nursing notes.   HISTORY  Chief Complaint Chest Pain   HPI Edward Penick. is a 61 y.o. male with a history of CAD, CVA, hypertension, diabetes who presents for evaluation of chest pain.Patient reports that he has been having intermittent episodes of chest pain for several months. The one today started at 54 AM with sudden onset of left-sided chest pressure radiating to his left arm. The pain resolved without intervention. At 5 PM pain recurred. Patient was complaining of 10 out of 10 chest pressure located in the center of his chest and radiating to his left arm up. He is also feeling dizzy and short of breath. Vomited twice. No diaphoresis. Patient is not a smoker. No family history of ischemic heart disease. Received 324 mg of aspirin and 3 sublingual nitroglycerin for EMS in route. Pain improved from 10/10 to 3/10 on arrival.  Past Medical History:  Diagnosis Date  . Allergy   . Asthma   . C. difficile diarrhea   . Cancer [facility]) June 2016   liver cancer  . Chronic pain   . DDD (degenerative disc disease), cervical   . DDD (degenerative disc disease), lumbar   . Diabetes mellitus without complication ([place])   . GERD (gastroesophageal reflux disease)   . Headache    migraines - none since 02/17  . Hypertension   . Low blood sugar   . Myocardial infarction ([place])   . Stroke [facility])    'mini-stroke" 30 yrs ago. no deficits.  . Wears dentures    full upper and lower    Patient Active Problem List   Diagnosis Date Noted  . Advanced care planning/counseling discussion 03/29/2017  . DM type 2 with diabetic peripheral neuropathy ([place]) 09/30/2016  . Skin lesions, generalized 09/24/2016  . Insomnia 12/24/2015  . Allergy to bee sting 12/12/2015  . Hypercholesterolemia 09/23/2015    . Carotid artery narrowing 09/11/2015  . Atherosclerosis of abdominal aorta ([place]) 08/26/2015  . Chest pain 08/21/2015  . Migraine headache 08/21/2015  . Hereditary and idiopathic peripheral neuropathy 05/22/2015  . COPD (chronic obstructive pulmonary disease) ([place]) 04/25/2015  . DDD (degenerative disc disease), cervical 03/27/2015  . DDD (degenerative disc disease), lumbar 03/27/2015  . Cervical post-laminectomy syndrome 03/27/2015  . Bilateral occipital neuralgia 03/27/2015  . Pancreatic insufficiency 01/28/2015  . Back pain, chronic 12/24/2014  . 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 APPENDECTOMY    . BACK SURGERY    . COLONOSCOPY WITH PROPOFOL N/A 03/06/2016   Procedure: COLONOSCOPY WITH PROPOFOL;  Surgeon: Lucilla Lame, MD;  Location: [place];  Service: Endoscopy;  Laterality: N/A;  requests early  . FINGER SURGERY Left   . KNEE SURGERY Right   . NECK SURGERY    . spleen surg    . spleen surgery    . TOE SURGERY Right     Prior to Admission medications   Medication Sig Start Date End Date Taking? Authorizing Provider  aspirin EC 81 MG tablet Take 81 mg by mouth daily.   Yes [provider]  Aspirin-Acetaminophen-Caffeine (EXCEDRIN MIGRAINE PO) Take by mouth.   Yes [provider]  canagliflozin (INVOKANA) 300 MG TABS tablet Take 1 tablet (300 mg total) by mouth daily before breakfast. 03/29/17  Yes Crissman, Jeannette How, MD  diphenhydrAMINE (  BENADRYL) 25 mg capsule Take 25 mg by mouth 2 (two) times daily.    Yes [provider]  DULoxetine (CYMBALTA) 30 MG capsule Take 1 capsule (30 mg total) by mouth 2 (two) times daily. 03/29/17  Yes Crissman, Jeannette How, MD  empagliflozin (JARDIANCE) 25 MG TABS tablet Take 25 mg by mouth daily. 03/29/17  Yes Guadalupe Maple, MD  metFORMIN (GLUCOPHAGE) 500 MG tablet Take 2 tablets (1,000 mg total) by mouth 2 (two) times daily with a meal. 03/29/17  Yes  Crissman, Jeannette How, MD  omeprazole (PRILOSEC OTC) 20 MG tablet Take 20 mg by mouth 2 (two) times daily.   Yes [provider]  oxyCODONE (ROXICODONE) 5 MG immediate release tablet Limit one tablet by mouth 3-6  times per day if tolerated Patient taking differently: Limit one tablet by mouth 3-6  times per day if tolerated, pt states he is taking 6 tablets daily 06/11/16  Yes Mohammed Kindle, MD  traZODone (DESYREL) 100 MG tablet Take 1 tablet (100 mg total) by mouth at bedtime as needed for sleep. 03/29/17  Yes Crissman, Jeannette How, MD  diphenoxylate-atropine (LOMOTIL) 2.5-0.025 MG tablet Take 1 tablet by mouth 4 (four) times daily as needed for diarrhea or loose stools. Patient not taking: Reported on 04/03/2017 04/28/16   Lucilla Lame, MD  triamcinolone cream (KENALOG) 0.1 % Apply 1 application topically 2 (two) times daily. 10/06/16   Guadalupe Maple, MD    Allergies Bee pollen; Crestor [rosuvastatin calcium]; Shrimp [shellfish allergy]; Fentanyl; Gabapentin; Morphine; Morphine and related; Simvastatin; and Buprenorphine hcl  Family History  Problem Relation Age of Onset  . Arthritis Mother   . Diabetes Mother   . Kidney disease Mother   . Heart disease Mother   . Hypertension Mother   . Arthritis Father   . Hearing loss Father   . Hypertension Father   . Diabetes Sister   . Heart disease Sister   . Diabetes Daughter   . Diabetes Maternal Aunt   . Diabetes Maternal Grandmother     Social History Social History  Substance Use Topics  . Smoking status: Former Smoker    Packs/day: 2.00    Years: 50.00    Types: Cigarettes    Quit date: 07/05/2015  . Smokeless tobacco: Never Used  . Alcohol use No    Review of Systems  Constitutional: Negative for fever. Eyes: Negative for visual changes. ENT: Negative for sore throat. Neck: No neck pain  Cardiovascular: + chest pain. Respiratory: Negative for shortness of breath. Gastrointestinal: Negative for abdominal pain, diarrhea.  + N/V Genitourinary: Negative for dysuria. Musculoskeletal: Negative for back pain. Skin: Negative for rash. Neurological: Negative for headaches, weakness or numbness. Psych: No SI or HI  ____________________________________________   PHYSICAL EXAM:  VITAL SIGNS: ED Triage Vitals  Enc Vitals Group     BP 04/03/17 2153 134/80     Pulse Rate 04/03/17 2153 69     Resp 04/03/17 2153 12     Temp 04/03/17 2153 97.7 F (36.5 C)     Temp Source 04/03/17 2153 Oral     SpO2 04/03/17 2152 95 %     Weight 04/03/17 2153 220 lb (99.8 kg)     Height 04/03/17 2153 5\' 8"  (1.727 m)     Head Circumference --      Peak Flow --      Pain Score 04/03/17 2153 4     Pain Loc --      Pain Edu? --  Excl. in [place]? --     Constitutional: Alert and oriented. Well appearing and in no apparent distress. HEENT:      Head: Normocephalic and atraumatic.         Eyes: Conjunctivae are normal. Sclera is non-icteric.       Mouth/Throat: Mucous membranes are moist.       Neck: Supple with no signs of meningismus. Cardiovascular: Regular rate and rhythm. No murmurs, gallops, or rubs. 2+ symmetrical distal pulses are present in all extremities. No JVD. Respiratory: Normal respiratory effort. Lungs are clear to auscultation bilaterally. No wheezes, crackles, or rhonchi.  Gastrointestinal: Soft, non tender, and non distended with positive bowel sounds. No rebound or guarding. Genitourinary: No CVA tenderness. Musculoskeletal: Nontender with normal range of motion in all extremities. No edema, cyanosis, or erythema of extremities. Neurologic: Normal speech and language. Face is symmetric. Moving all extremities. No gross focal neurologic deficits are appreciated. Skin: Skin is warm, dry and intact. No rash noted. Psychiatric: Mood and affect are normal. Speech and behavior are normal.  ____________________________________________   LABS (all labs ordered are listed, but only abnormal results are  displayed)  Labs Reviewed  BASIC METABOLIC PANEL - Abnormal; Notable for the following:       Result Value   Glucose, Bld 135 (*)    All other components within normal limits  CBC - Abnormal; Notable for the following:    HCT 39.2 (*)    All other components within normal limits  TROPONIN I  APTT  PROTIME-INR  TROPONIN I   ____________________________________________  EKG  ED ECG REPORT I, Rudene Re, the attending physician, personally viewed and interpreted this ECG.   21:52 - normal sinus rhythm, rate of 68, normal intervals, normal axis, no ST elevations or depressions  22:46 - normal sinus rhythm with occasional PVCs, rate of 66, normal intervals, normal axis, no ST elevations or depressions. Unchanged from prior. ____________________________________________  RADIOLOGY  CXR: No active disease. ____________________________________________   PROCEDURES  Procedure(s) performed: None Procedures Critical Care performed: yes  CRITICAL CARE Performed by: Rudene Re  ?  Total critical care time: 35 min  Critical care time was exclusive of separately billable procedures and treating other patients.  Critical care was necessary to treat or prevent imminent or life-threatening deterioration.  Critical care was time spent personally by me on the following activities: development of treatment plan with patient and/or surrogate as well as nursing, discussions with consultants, evaluation of patient's response to treatment, examination of patient, obtaining history from patient or surrogate, ordering and performing treatments and interventions, ordering and review of laboratory studies, ordering and review of radiographic studies, pulse oximetry and re-evaluation of patient's condition.  ____________________________________________   INITIAL IMPRESSION / ASSESSMENT AND PLAN / ED COURSE   61 y.o. male with a history of CAD, CVA, hypertension, diabetes who  presents for evaluation of chest pain. Pain improved with nitroglycerin however recurred upon arrival to the emergency room. Initial EKG with no evidence of ischemia. Patient's BP was in the low 110s therefore patient was started on a nitro drip. Nitro drip was titrated to 30 mcg/m with resolution of the pain. Repeat EKG with no ischemic changes. First troponin is negative. At the request of the hospitalist Dr. Jannifer Franklin I spoke with Dr. Ubaldo Glassing, cardiologist on call who recommended starting patient on a heparin drip until the second troponin is done. His second troponin is negative recommended stopping heparin drip. Patient admitted to the hospitalist service for unstable angina.  Pertinent labs & imaging results that were available during my care of the patient were reviewed by me and considered in my medical decision making (see chart for details).    ____________________________________________   FINAL CLINICAL IMPRESSION(S) / ED DIAGNOSES  Final diagnoses:  Unstable angina (HCC)      NEW MEDICATIONS STARTED DURING THIS VISIT:  New Prescriptions   No medications on file     Note:  This document was prepared using Dragon voice recognition software and may include unintentional dictation errors.    Alfred Levins, [place], MD 04/04/17 [phone]

## 2017-04-04 ENCOUNTER — Observation Stay
Admit: 2017-04-04 | Discharge: 2017-04-04 | Disposition: A | Discharge status: Home / Self Care | Payer: Medicare Other | Attending: Internal Medicine | Admitting: Internal Medicine

## 2017-04-04 DIAGNOSIS — E119 Type 2 diabetes mellitus without complications: Secondary | ICD-10-CM | POA: Diagnosis not present

## 2017-04-04 DIAGNOSIS — K219 Gastro-esophageal reflux disease without esophagitis: Secondary | ICD-10-CM | POA: Diagnosis present

## 2017-04-04 DIAGNOSIS — J449 Chronic obstructive pulmonary disease, unspecified: Secondary | ICD-10-CM | POA: Diagnosis not present

## 2017-04-04 DIAGNOSIS — R0789 Other chest pain: Secondary | ICD-10-CM | POA: Diagnosis not present

## 2017-04-04 DIAGNOSIS — R079 Chest pain, unspecified: Secondary | ICD-10-CM | POA: Diagnosis not present

## 2017-04-04 LAB — GLUCOSE, CAPILLARY
GLUCOSE-CAPILLARY: 105 mg/dL — AB (ref 65–99)
GLUCOSE-CAPILLARY: 123 mg/dL — AB (ref 65–99)
Glucose-Capillary: 131 mg/dL — ABNORMAL HIGH (ref 65–99)
Glucose-Capillary: 125 mg/dL — ABNORMAL HIGH (ref 65–99)
Glucose-Capillary: 114 mg/dL — ABNORMAL HIGH (ref 65–99)

## 2017-04-04 LAB — TROPONIN I: Troponin I: <0.03 ng/mL (ref <0.03)

## 2017-04-04 LAB — BASIC METABOLIC PANEL
Anion gap: 5 (ref 5–15)
BUN: 14 mg/dL (ref 6–20)
CALCIUM: 8.5 mg/dL — AB (ref 8.9–10.3)
CO2: 26 mmol/L (ref 22–32)
Chloride: 106 mmol/L (ref 101–111)
Creatinine, Ser: 0.96 mg/dL (ref 0.61–1.24)
GFR calc non Af Amer: >60 mL/min (ref >60)
Glucose, Bld: 109 mg/dL — ABNORMAL HIGH (ref 65–99)
Potassium: 3.7 mmol/L (ref 3.5–5.1)
Sodium: 137 mmol/L (ref 135–145)

## 2017-04-04 LAB — CBC
HCT: 36.7 % — ABNORMAL LOW (ref 40.0–52.0)
Hemoglobin: 12.4 g/dL — ABNORMAL LOW (ref 13.0–18.0)
MCH: 29.7 pg (ref 26.0–34.0)
MCHC: 33.9 g/dL (ref 32.0–36.0)
MCV: 87.5 fL (ref 80.0–100.0)
PLATELETS: 162 10*3/uL (ref 150–440)
RBC: 4.19 MIL/uL — AB (ref 4.40–5.90)
RDW: 14.5 % (ref 11.5–14.5)
WBC: 8.4 10*3/uL (ref 3.8–10.6)

## 2017-04-04 LAB — ECHOCARDIOGRAM COMPLETE
Height: 68 in
WEIGHTICAEL: 3484.8 [oz_av]

## 2017-04-04 MED ORDER — DULOXETINE HCL 30 MG PO CPEP
30.0000 mg | ORAL_CAPSULE | Freq: Two times a day (BID) | ORAL | Status: DC
  Administered 2017-04-04 – 2017-04-05 (×3): 30 mg via ORAL
  Filled 2017-04-04 (×3): qty 1

## 2017-04-04 MED ORDER — INSULIN ASPART 100 UNIT/ML ~~LOC~~ SOLN
0.0000 [IU] | Freq: Four times a day (QID) | SUBCUTANEOUS | Status: DC
  Administered 2017-04-04 (×2): 1 [IU] via SUBCUTANEOUS
  Filled 2017-04-04 (×2): qty 1

## 2017-04-04 MED ORDER — TRAZODONE HCL 100 MG PO TABS: 100.0000 mg | ORAL_TABLET | Freq: Every evening | ORAL | Status: DC | PRN

## 2017-04-04 MED ORDER — PANTOPRAZOLE SODIUM 40 MG PO TBEC
40.0000 mg | DELAYED_RELEASE_TABLET | Freq: Two times a day (BID) | ORAL | Status: DC
  Administered 2017-04-04 – 2017-04-05 (×3): 40 mg via ORAL
  Filled 2017-04-04 (×3): qty 1

## 2017-04-04 MED ORDER — ONDANSETRON HCL 4 MG/2ML IJ SOLN: 4.0000 mg | Freq: Four times a day (QID) | INTRAMUSCULAR | Status: DC | PRN

## 2017-04-04 MED ORDER — ACETAMINOPHEN 650 MG RE SUPP: 650.0000 mg | Freq: Four times a day (QID) | RECTAL | Status: DC | PRN

## 2017-04-04 MED ORDER — DIPHENHYDRAMINE HCL 25 MG PO CAPS
25.0000 mg | ORAL_CAPSULE | Freq: Every evening | ORAL | Status: DC | PRN
  Administered 2017-04-04: 25 mg via ORAL
  Filled 2017-04-04: qty 1

## 2017-04-04 MED ORDER — KETOROLAC TROMETHAMINE 15 MG/ML IJ SOLN
15.0000 mg | Freq: Four times a day (QID) | INTRAMUSCULAR | Status: DC | PRN
  Administered 2017-04-04 – 2017-04-05 (×2): 15 mg via INTRAVENOUS
  Filled 2017-04-04 (×2): qty 1

## 2017-04-04 MED ORDER — NITROGLYCERIN IN D5W 200-5 MCG/ML-% IV SOLN
0.0000 ug/min | INTRAVENOUS | Status: DC
  Administered 2017-04-04: 30 ug/min via INTRAVENOUS

## 2017-04-04 MED ORDER — NITROGLYCERIN 2 % TD OINT
1.0000 [in_us] | TOPICAL_OINTMENT | Freq: Four times a day (QID) | TRANSDERMAL | Status: DC
  Administered 2017-04-04 – 2017-04-05 (×4): 1 [in_us] via TOPICAL
  Filled 2017-04-04 (×4): qty 1

## 2017-04-04 MED ORDER — OXYCODONE HCL 5 MG PO TABS
5.0000 mg | ORAL_TABLET | Freq: Four times a day (QID) | ORAL | Status: DC | PRN
  Administered 2017-04-04 (×3): 5 mg via ORAL
  Filled 2017-04-04 (×3): qty 1

## 2017-04-04 MED ORDER — ASPIRIN EC 81 MG PO TBEC
81.0000 mg | DELAYED_RELEASE_TABLET | Freq: Every day | ORAL | Status: DC
  Administered 2017-04-04 – 2017-04-05 (×2): 81 mg via ORAL
  Filled 2017-04-04 (×2): qty 1

## 2017-04-04 MED ORDER — ONDANSETRON HCL 4 MG PO TABS: 4.0000 mg | ORAL_TABLET | Freq: Four times a day (QID) | ORAL | Status: DC | PRN

## 2017-04-04 MED ORDER — PANTOPRAZOLE SODIUM 20 MG PO TBEC: 20.0000 mg | DELAYED_RELEASE_TABLET | Freq: Two times a day (BID) | ORAL | Status: DC

## 2017-04-04 MED ORDER — ACETAMINOPHEN 325 MG PO TABS
650.0000 mg | ORAL_TABLET | Freq: Four times a day (QID) | ORAL | Status: DC | PRN
  Administered 2017-04-04 – 2017-04-05 (×5): 650 mg via ORAL
  Filled 2017-04-04 (×5): qty 2

## 2017-04-04 NOTE — Consult Note (Signed)
[place]  CARDIOLOGY CONSULT NOTE  Patient ID: Edward Schneider. MRN: [id number] DOB/AGE: [birth year] 61 y.o.  Admit date: 04/03/2017 Referring Physician Dr. Jannifer Franklin Primary Physician Dr. Golden Pop Primary Cardiologist Dr. Nehemiah Massed Reason for Consultation Chest pain  HPI: Patient is a 61 year old male with a reported history of coronary artery disease although records are not available, history of syncope in the past, history of chronic back pain who presented to the emergency room complaining of several weeks of chest discomfort. He states he has had the pain for some time but had deferred coming to the hospital. Per report, the patient had more severe pain tonight so he presented to the emergency room. He was placed on IV nitroglycerin as well as a heparin drip. Electrocardiogram showed no ischemia or arrhythmia. Cardiac markers were normal with a normal troponin. He states the pain is present both with exertion and with rest. He states he is a former smoker quit smoking in 2016. He is currently on enteric-coated aspirin 81 mg daily, oral agents for his diabetes,  Review of Systems  Constitutional: Negative.   HENT: Negative.   Eyes: Negative.   Respiratory: Negative.   Cardiovascular: Positive for chest pain.  Gastrointestinal: Negative.   Genitourinary: Negative.   Musculoskeletal: Positive for back pain.  Skin: Negative.   Neurological: Negative.   Endo/Heme/Allergies: Negative.   Psychiatric/Behavioral: Negative.     Past Medical History:  Diagnosis Date  . Allergy   . Asthma   . C. difficile diarrhea   . Cancer [facility]) June 2016   liver cancer  . Chronic pain   . DDD (degenerative disc disease), cervical   . DDD (degenerative disc disease), lumbar   . Diabetes mellitus without complication ([place])   . GERD (gastroesophageal reflux disease)   . Headache    migraines - none since 02/17  . Hypertension   . Low blood  sugar   . Myocardial infarction ([place])   . Stroke [facility])    'mini-stroke" 30 yrs ago. no deficits.  . Wears dentures    full upper and lower    Family History  Problem Relation Age of Onset  . Arthritis Mother   . Diabetes Mother   . Kidney disease Mother   . Heart disease Mother   . Hypertension Mother   . Arthritis Father   . Hearing loss Father   . Hypertension Father   . Diabetes Sister   . Heart disease Sister   . Diabetes Daughter   . Diabetes Maternal Aunt   . Diabetes Maternal Grandmother     Social History   Social History  . Marital status: Married    Spouse name: N/A  . Number of children: N/A  . Years of education: N/A   Occupational History  . Not on file.   Social History Main Topics  . Smoking status: Former Smoker    Packs/day: 2.00    Years: 50.00    Types: Cigarettes    Quit date: 07/05/2015  . Smokeless tobacco: Never Used  . Alcohol use No  . Drug use: No  . Sexual activity: Not on file   Other Topics Concern  . Not on file   Social History Narrative  . No narrative on file    Past Surgical History:  Procedure Laterality Date  . APPENDECTOMY    . BACK SURGERY    . COLONOSCOPY WITH PROPOFOL N/A 03/06/2016   Procedure: COLONOSCOPY WITH PROPOFOL;  Surgeon: Lucilla Lame, MD;  Location: [place];  Service: Endoscopy;  Laterality: N/A;  requests early  . FINGER SURGERY Left   . KNEE SURGERY Right   . NECK SURGERY    . spleen surg    . spleen surgery    . TOE SURGERY Right      Prescriptions Prior to Admission  Medication Sig Dispense Refill Last Dose  . aspirin EC 81 MG tablet Take 81 mg by mouth daily.   04/03/2017 at Unknown time  . Aspirin-Acetaminophen-Caffeine (EXCEDRIN MIGRAINE PO) Take by mouth.   Taking  . canagliflozin (INVOKANA) 300 MG TABS tablet Take 1 tablet (300 mg total) by mouth daily before breakfast. 30 tablet 12 04/03/2017 at Unknown time  . diphenhydrAMINE (BENADRYL) 25 mg capsule Take 25 mg by mouth 2 (two)  times daily.    Taking  . DULoxetine (CYMBALTA) 30 MG capsule Take 1 capsule (30 mg total) by mouth 2 (two) times daily. 60 capsule 12 04/03/2017 at Unknown time  . metFORMIN (GLUCOPHAGE) 500 MG tablet Take 2 tablets (1,000 mg total) by mouth 2 (two) times daily with a meal. 360 tablet 4 Past Week at Unknown time  . omeprazole (PRILOSEC OTC) 20 MG tablet Take 20 mg by mouth 2 (two) times daily.   04/03/2017 at Unknown time  . oxyCODONE (ROXICODONE) 5 MG immediate release tablet Limit one tablet by mouth 3-6  times per day if tolerated (Patient taking differently: Limit one tablet by mouth 3-6  times per day if tolerated, pt states he is taking 6 tablets daily) 170 tablet 0 Taking  . traZODone (DESYREL) 100 MG tablet Take 1 tablet (100 mg total) by mouth at bedtime as needed for sleep. 90 tablet 4 04/02/2017 at Unknown time  . diphenoxylate-atropine (LOMOTIL) 2.5-0.025 MG tablet Take 1 tablet by mouth 4 (four) times daily as needed for diarrhea or loose stools. (Patient not taking: Reported on 04/03/2017) 120 tablet 5 Not Taking at Unknown time  . triamcinolone cream (KENALOG) 0.1 % Apply 1 application topically 2 (two) times daily. 30 g 0 Taking    Physical Exam: Blood pressure 100/61, pulse (!) 52, temperature 97.8 F (36.6 C), resp. rate 18, height 5\' 8"  (1.727 m), weight 98.8 kg (217 lb 12.8 oz), SpO2 97 %.   Wt Readings from Last 1 Encounters:  04/04/17 98.8 kg (217 lb 12.8 oz)     General appearance: cooperative and mildly obese Resp: clear to auscultation bilaterally Chest wall: no tenderness Cardio: regular rate and rhythm GI: soft, non-tender; bowel sounds normal; no masses,  no organomegaly Extremities: extremities normal, atraumatic, no cyanosis or edema Neurologic: Grossly normal  Labs:   Lab Results  Component Value Date   WBC 8.4 04/04/2017   HGB 12.4 (L) 04/04/2017   HCT 36.7 (L) 04/04/2017   MCV 87.5 04/04/2017   PLT 162 04/04/2017    Recent Labs Lab 03/29/17 0858   04/04/17 0615  NA 138  < > 137  K 4.1  < > 3.7  CL 103  < > 106  CO2 22  < > 26  BUN 12  < > 14  CREATININE 0.85  < > 0.96  CALCIUM 9.1  < > 8.5*  PROT 7.0  --   --   BILITOT 0.4  --   --   ALKPHOS 65  --   --   ALT 26  --   --   AST 21  --   --   GLUCOSE 115*  < >  109*  < > = values in this interval not displayed. Lab Results  Component Value Date   WCHENID 782 (H) 10/03/2015   TROPONINI <0.03 04/04/2017      ASSESSMENT AND PLAN:  Patient with reported history of coronary artery disease although records are not currently available who presented with chest pain was atypical features. He is ruled out for myocardial infarction. He is resting comfortably although states he still has pain does not appear an extremist. He has ruled out for myocardial infarction. Will discontinue heparin and discontinue IV nitroglycerin and placed on Nitropaste and continue with aspirin. She was a functional study in the morning to evaluate his ischemic risk and make further recommendations after this is complete. Signed: Teodoro Spray MD, [facility] 04/04/2017, 8:26 AM

## 2017-04-04 NOTE — Care Management Obs Status (Signed)
[place] NOTIFICATION   Patient Details  Name: Edward Schneider. MRN: [id number] Date of Birth: 1955-12-25   Medicare Observation Status Notification Given:  Yes [facility] letter)    Mardene Speak, RN 04/04/2017, 3:39 PM

## 2017-04-04 NOTE — Progress Notes (Signed)
Pt still complaining of chest 4/10 and head 10/10 pain after prn pain medication. MD notified. Orders for toradol received. I will continue to assess.

## 2017-04-04 NOTE — Progress Notes (Signed)
Titrated nitro down to 4.62ml/hr. I will continue to assess.

## 2017-04-04 NOTE — Progress Notes (Signed)
MD order to discontinue nitro drip. Rate decreased to 25ml/hr. I will continue to assess and reduce rate til discontinued.

## 2017-04-04 NOTE — Progress Notes (Signed)
Titrated gtt down to 3. I will continue assess.

## 2017-04-04 NOTE — Progress Notes (Signed)
ANTICOAGULATION CONSULT NOTE - Initial Consult  Pharmacy Consult for heaprin drip Indication: chest pain/ACS  Allergies  Allergen Reactions  . Bee Pollen Anaphylaxis    Died 3 times when stung by bees. Carries Epi-pen at all times.  . Crestor [Rosuvastatin Calcium] Shortness Of Breath and Swelling  . Shrimp [Shellfish Allergy] Anaphylaxis  . Fentanyl Itching    blisters  . Gabapentin Diarrhea    Severe diarrhea which caused incontinence, loss of appetite and weight loss.   . Morphine Itching  . Morphine And Related Itching  . Simvastatin Diarrhea  . Buprenorphine Hcl Itching    Patient Measurements: Height: 5\' 8"  (172.7 cm) Weight: 220 lb (99.8 kg) IBW/kg (Calculated) : 68.4 Heparin Dosing Weight: 90kg  Vital Signs: Temp: 97.7 F (36.5 C) (06/16 2153) Temp Source: Oral (06/16 2153) BP: 100/74 (06/16 2300) Pulse Rate: 65 (06/16 2300)  Labs:  Recent Labs  04/03/17 2152  HGB 13.3  HCT 39.2*  PLT 199  APTT 29  LABPROT 12.5  INR 0.93  CREATININE 1.02  TROPONINI <0.03    Estimated Creatinine Clearance: 88.2 mL/min (by C-G formula based on SCr of 1.02 mg/dL).   Medical History: Past Medical History:  Diagnosis Date  . Allergy   . Asthma   . C. difficile diarrhea   . Cancer [facility]) June 2016   liver cancer  . Chronic pain   . DDD (degenerative disc disease), cervical   . DDD (degenerative disc disease), lumbar   . Diabetes mellitus without complication ([place])   . GERD (gastroesophageal reflux disease)   . Headache    migraines - none since 02/17  . Hypertension   . Low blood sugar   . Myocardial infarction ([place])   . Stroke [facility])    'mini-stroke" 30 yrs ago. no deficits.  . Wears dentures    full upper and lower    Medications:  No anticoag in PTA meds.  Assessment:  Goal of Therapy:  Heparin level 0.3-0.7 units/ml Monitor platelets by anticoagulation protocol: Yes   Plan:  4000 unit bolus and initial rate of 1250 units/hr. First heparin level  6 hours after start of infusion.  Joei Frangos S 04/04/2017,12:38 AM

## 2017-04-04 NOTE — Progress Notes (Signed)
*  PRELIMINARY RESULTS* Echocardiogram 2D Echocardiogram has been performed.  Lavell Luster Nhung Danko 04/04/2017, 11:44 AM

## 2017-04-04 NOTE — ED Notes (Signed)
Report called to floor, given to [place], Therapist, sports.

## 2017-04-04 NOTE — ED Notes (Signed)
Spoke to Dr. Jannifer Franklin regarding IV Ntg, he will put in an order to continue.

## 2017-04-04 NOTE — H&P (Signed)
[facility] - [place] at [facility]   PATIENT NAME: Edward Schneider    MR#:  [id number]  DATE OF BIRTH:  1956/08/19  DATE OF ADMISSION:  04/03/2017  PRIMARY CARE PHYSICIAN: Steele Sizer, MD   REQUESTING/REFERRING PHYSICIAN: Don Perking, MD  CHIEF COMPLAINT:   Chief Complaint  Patient presents with  . Chest Pain    HISTORY OF PRESENT ILLNESS:  Edward Schneider  is a 61 y.o. male who presents with Several weeks of chest pressure. Patient states that he does not like to come to the hospital and so was trying to ignore it. He has a prior history of heart disease and stent placement when he was in his 30s per his report. He states that the pressure became so significant tonight that he had to come to the ED for evaluation. Here he was placed on nitro drip which has alleviated his chest pain to about a 2 out of 10 per his rating. He is also placed on heparin drip. Initial workup was largely within normal limits. Hospitalists were called for admission.  PAST MEDICAL HISTORY:   Past Medical History:  Diagnosis Date  . Allergy   . Asthma   . C. difficile diarrhea   . Cancer [facility]) June 2016   liver cancer  . Chronic pain   . DDD (degenerative disc disease), cervical   . DDD (degenerative disc disease), lumbar   . Diabetes mellitus without complication (HCC)   . GERD (gastroesophageal reflux disease)   . Headache    migraines - none since 02/17  . Hypertension   . Low blood sugar   . Myocardial infarction (HCC)   . Stroke [facility])    'mini-stroke" 30 yrs ago. no deficits.  . Wears dentures    full upper and lower    PAST SURGICAL HISTORY:   Past Surgical History:  Procedure Laterality Date  . APPENDECTOMY    . BACK SURGERY    . COLONOSCOPY WITH PROPOFOL N/A 03/06/2016   Procedure: COLONOSCOPY WITH PROPOFOL;  Surgeon: Midge Minium, MD;  Location: [facility];  Service: Endoscopy;  Laterality: N/A;  requests early  . FINGER SURGERY Left   . KNEE SURGERY  Right   . NECK SURGERY    . spleen surg    . spleen surgery    . TOE SURGERY Right     SOCIAL HISTORY:   Social History  Substance Use Topics  . Smoking status: Former Smoker    Packs/day: 2.00    Years: 50.00    Types: Cigarettes    Quit date: 07/05/2015  . Smokeless tobacco: Never Used  . Alcohol use No    FAMILY HISTORY:   Family History  Problem Relation Age of Onset  . Arthritis Mother   . Diabetes Mother   . Kidney disease Mother   . Heart disease Mother   . Hypertension Mother   . Arthritis Father   . Hearing loss Father   . Hypertension Father   . Diabetes Sister   . Heart disease Sister   . Diabetes Daughter   . Diabetes Maternal Aunt   . Diabetes Maternal Grandmother     DRUG ALLERGIES:   Allergies  Allergen Reactions  . Bee Pollen Anaphylaxis    Died 3 times when stung by bees. Carries Epi-pen at all times.  . Crestor [Rosuvastatin Calcium] Shortness Of Breath and Swelling  . Shrimp [Shellfish Allergy] Anaphylaxis  . Fentanyl Itching    blisters  . Gabapentin Diarrhea  Severe diarrhea which caused incontinence, loss of appetite and weight loss.   . Morphine Itching  . Morphine And Related Itching  . Simvastatin Diarrhea  . Buprenorphine Hcl Itching    MEDICATIONS AT HOME:   Prior to Admission medications   Medication Sig Start Date End Date Taking? Authorizing Provider  aspirin EC 81 MG tablet Take 81 mg by mouth daily.   Yes [provider]  Aspirin-Acetaminophen-Caffeine (EXCEDRIN MIGRAINE PO) Take by mouth.   Yes [provider]  canagliflozin (INVOKANA) 300 MG TABS tablet Take 1 tablet (300 mg total) by mouth daily before breakfast. 03/29/17  Yes Crissman, Redge Gainer, MD  diphenhydrAMINE (BENADRYL) 25 mg capsule Take 25 mg by mouth 2 (two) times daily.    Yes [provider]  DULoxetine (CYMBALTA) 30 MG capsule Take 1 capsule (30 mg total) by mouth 2 (two) times daily. 03/29/17  Yes Crissman, Redge Gainer, MD   empagliflozin (JARDIANCE) 25 MG TABS tablet Take 25 mg by mouth daily. 03/29/17  Yes Steele Sizer, MD  metFORMIN (GLUCOPHAGE) 500 MG tablet Take 2 tablets (1,000 mg total) by mouth 2 (two) times daily with a meal. 03/29/17  Yes Crissman, Redge Gainer, MD  omeprazole (PRILOSEC OTC) 20 MG tablet Take 20 mg by mouth 2 (two) times daily.   Yes [provider]  oxyCODONE (ROXICODONE) 5 MG immediate release tablet Limit one tablet by mouth 3-6  times per day if tolerated Patient taking differently: Limit one tablet by mouth 3-6  times per day if tolerated, pt states he is taking 6 tablets daily 06/11/16  Yes Ewing Schlein, MD  traZODone (DESYREL) 100 MG tablet Take 1 tablet (100 mg total) by mouth at bedtime as needed for sleep. 03/29/17  Yes Crissman, Redge Gainer, MD  diphenoxylate-atropine (LOMOTIL) 2.5-0.025 MG tablet Take 1 tablet by mouth 4 (four) times daily as needed for diarrhea or loose stools. Patient not taking: Reported on 04/03/2017 04/28/16   Midge Minium, MD  triamcinolone cream (KENALOG) 0.1 % Apply 1 application topically 2 (two) times daily. 10/06/16   Steele Sizer, MD    REVIEW OF SYSTEMS:  Review of Systems  Constitutional: Negative for chills, fever, malaise/fatigue and weight loss.  HENT: Negative for ear pain, hearing loss and tinnitus.   Eyes: Negative for blurred vision, double vision, pain and redness.  Respiratory: Positive for shortness of breath. Negative for cough and hemoptysis.   Cardiovascular: Positive for chest pain. Negative for palpitations, orthopnea and leg swelling.  Gastrointestinal: Negative for abdominal pain, constipation, diarrhea, nausea and vomiting.  Genitourinary: Negative for dysuria, frequency and hematuria.  Musculoskeletal: Negative for back pain, joint pain and neck pain.  Skin:       No acne, rash, or lesions  Neurological: Negative for dizziness, tremors, focal weakness and weakness.  Endo/Heme/Allergies: Negative for polydipsia. Does not  bruise/bleed easily.  Psychiatric/Behavioral: Negative for depression. The patient is not nervous/anxious and does not have insomnia.      VITAL SIGNS:   Vitals:   04/03/17 2240 04/03/17 2245 04/03/17 2248 04/03/17 2300  BP: 111/86 121/79 109/72 100/74  Pulse: 63 (!) 59 65 65  Resp: 15 12 14 16   Temp:      TempSrc:      SpO2: 95% 96% 95% 94%  Weight:      Height:       Wt Readings from Last 3 Encounters:  04/03/17 99.8 kg (220 lb)  03/29/17 100.6 kg (221 lb 12.8 oz)  03/12/17 98.9  kg (218 lb)    PHYSICAL EXAMINATION:  Physical Exam  Vitals reviewed. Constitutional: He is oriented to person, place, and time. He appears well-developed and well-nourished. No distress.  HENT:  Head: Normocephalic and atraumatic.  Mouth/Throat: Oropharynx is clear and moist.  Eyes: Conjunctivae and EOM are normal. Pupils are equal, round, and reactive to light. No scleral icterus.  Neck: Normal range of motion. Neck supple. No JVD present. No thyromegaly present.  Cardiovascular: Normal rate, regular rhythm and intact distal pulses.  Exam reveals no gallop and no friction rub.   No murmur heard. Respiratory: Effort normal and breath sounds normal. No respiratory distress. He has no wheezes. He has no rales.  GI: Soft. Bowel sounds are normal. He exhibits no distension. There is no tenderness.  Musculoskeletal: Normal range of motion. He exhibits no edema.  No arthritis, no gout  Lymphadenopathy:    He has no cervical adenopathy.  Neurological: He is alert and oriented to person, place, and time. No cranial nerve deficit.  No dysarthria, no aphasia  Skin: Skin is warm and dry. No rash noted. No erythema.  Psychiatric: He has a normal mood and affect. His behavior is normal. Judgment and thought content normal.    LABORATORY PANEL:   CBC  Recent Labs Lab 04/03/17 2152  WBC 8.4  HGB 13.3  HCT 39.2*  PLT 199    ------------------------------------------------------------------------------------------------------------------  Chemistries   Recent Labs Lab 03/29/17 0858 04/03/17 2152  NA 138 138  K 4.1 3.7  CL 103 106  CO2 22 25  GLUCOSE 115* 135*  BUN 12 13  CREATININE 0.85 1.02  CALCIUM 9.1 9.2  AST 21  --   ALT 26  --   ALKPHOS 65  --   BILITOT 0.4  --    ------------------------------------------------------------------------------------------------------------------  Cardiac Enzymes  Recent Labs Lab 04/04/17 0024  TROPONINI <0.03   ------------------------------------------------------------------------------------------------------------------  RADIOLOGY:  Dg Chest Portable 1 View  Result Date: 04/03/2017 CLINICAL DATA:  Left-sided chest pain. EXAM: PORTABLE CHEST 1 VIEW COMPARISON:  December 15, 2015 FINDINGS: The heart size and mediastinal contours are within normal limits. Both lungs are clear. The visualized skeletal structures are unremarkable. IMPRESSION: No active disease. Electronically Signed   By: Gerome Sam III M.D   On: 04/03/2017 22:56    EKG:   Orders placed or performed during the hospital encounter of 04/03/17  . ED EKG within 10 minutes  . ED EKG within 10 minutes  . EKG 12-Lead  . EKG 12-Lead  . EKG 12-Lead  . EKG 12-Lead    IMPRESSION AND PLAN:  Principal Problem:   Chest pain - trend cardiac enzymes, continue nitro drip, get an echocardiogram and cardiology consult Active Problems:   COPD (chronic obstructive pulmonary disease) (HCC) - home dose inhalers   DM type 2 with diabetic peripheral neuropathy (HCC) - sliding scale insulin with corresponding glucose checks   Hypercholesterolemia - home dose anti-lipids   GERD (gastroesophageal reflux disease) - home dose PPI  All the records are reviewed and case discussed with ED provider. Management plans discussed with the patient and/or family.  DVT PROPHYLAXIS: Systemic  anticoagulation  GI PROPHYLAXIS: PPI  ADMISSION STATUS: Observation  CODE STATUS: Full Code Status History    Date Active Date Inactive Code Status Order ID Comments User Context   12/15/2015  5:40 PM 12/17/2015  7:01 PM Full Code [id number]  Alford Highland, MD ED      TOTAL TIME TAKING CARE OF THIS PATIENT: 40 minutes.  Bonny Vanleeuwen FIELDING 04/04/2017, 12:56 AM  [facility]  Office  [phone]  CC: Primary care physician; Steele Sizer, MD  Note:  This document was prepared using Dragon voice recognition software and may include unintentional dictation errors.

## 2017-04-04 NOTE — Plan of Care (Signed)
Problem: Safety: Goal: Ability to remain free from injury will improve Outcome: Progressing Pt independent in the room but encouraged to call for assistance if needed.

## 2017-04-05 ENCOUNTER — Observation Stay: Payer: Medicare Other

## 2017-04-05 DIAGNOSIS — R079 Chest pain, unspecified: Secondary | ICD-10-CM | POA: Diagnosis not present

## 2017-04-05 DIAGNOSIS — J449 Chronic obstructive pulmonary disease, unspecified: Secondary | ICD-10-CM | POA: Diagnosis not present

## 2017-04-05 DIAGNOSIS — E119 Type 2 diabetes mellitus without complications: Secondary | ICD-10-CM | POA: Diagnosis not present

## 2017-04-05 DIAGNOSIS — E785 Hyperlipidemia, unspecified: Secondary | ICD-10-CM | POA: Diagnosis not present

## 2017-04-05 LAB — GLUCOSE, CAPILLARY
GLUCOSE-CAPILLARY: 114 mg/dL — AB (ref 65–99)
Glucose-Capillary: 139 mg/dL — ABNORMAL HIGH (ref 65–99)
Glucose-Capillary: 162 mg/dL — ABNORMAL HIGH (ref 65–99)
Glucose-Capillary: 142 mg/dL — ABNORMAL HIGH (ref 65–99)

## 2017-04-05 LAB — HIV ANTIBODY (ROUTINE TESTING W REFLEX): HIV Screen 4th Generation wRfx: NONREACTIVE

## 2017-04-05 MED ORDER — TECHNETIUM TC 99M TETROFOSMIN IV KIT
30.0000 | PACK | Freq: Once | INTRAVENOUS | Status: AC | PRN
  Administered 2017-04-05: 30 via INTRAVENOUS

## 2017-04-05 MED ORDER — TECHNETIUM TC 99M TETROFOSMIN IV KIT
13.0000 | PACK | Freq: Once | INTRAVENOUS | Status: AC | PRN
  Administered 2017-04-05: 12.6 via INTRAVENOUS

## 2017-04-05 MED ORDER — PANTOPRAZOLE SODIUM 40 MG PO TBEC: 40.0000 mg | DELAYED_RELEASE_TABLET | Freq: Two times a day (BID) | ORAL | 0 refills | Status: DC

## 2017-04-05 MED ORDER — OXYCODONE HCL 5 MG PO TABS
5.0000 mg | ORAL_TABLET | ORAL | Status: DC | PRN
  Administered 2017-04-05 (×3): 5 mg via ORAL
  Filled 2017-04-05 (×3): qty 1

## 2017-04-05 MED ORDER — REGADENOSON 0.4 MG/5ML IV SOLN
0.4000 mg | Freq: Once | INTRAVENOUS | Status: AC
  Administered 2017-04-05: 0.4 mg via INTRAVENOUS

## 2017-04-07 LAB — NM MYOCAR MULTI W/SPECT W/WALL MOTION / EF
CHL CUP NUCLEAR SRS: 0
CHL CUP STRESS STAGE 2 GRADE: 0 %
CHL CUP STRESS STAGE 3 HR: 55 {beats}/min
CHL CUP STRESS STAGE 4 GRADE: 0 %
CHL CUP STRESS STAGE 4 SPEED: 0 mph
CHL CUP STRESS STAGE 5 SPEED: 0 mph
CHL CUP STRESS STAGE 6 SPEED: 0 mph
CHL CUP STRESS STAGE 7 HR: 67 {beats}/min
CHL CUP STRESS STAGE 8 SPEED: 0 mph
CSEPEW: 1 METS
LVDIAVOL: 85 mL (ref 62–150)
LVSYSVOL: 29 mL
Peak HR: 77 {beats}/min
Percent of predicted max HR: 48 %
SDS: 1
SSS: 0
Stage 1 HR: 55 {beats}/min
Stage 2 HR: 55 {beats}/min
Stage 2 Speed: 0 mph
Stage 3 Grade: 0 %
Stage 3 Speed: 0 mph
Stage 4 HR: 70 {beats}/min
Stage 5 Grade: 0 %
Stage 5 HR: 70 {beats}/min
Stage 6 Grade: 0 %
Stage 6 HR: 77 {beats}/min
Stage 7 Grade: 0 %
Stage 7 Speed: 0 mph
Stage 8 DBP: 76 mmHg
Stage 8 Grade: 0 %
Stage 8 HR: 60 {beats}/min
Stage 8 SBP: 117 mmHg
TID: 0.96

## 2017-04-13 NOTE — Discharge Summary (Signed)
[place] at [place] NAME: Edward Schneider    MR#:  [id number]  DATE OF BIRTH:  July 19, 1956  DATE OF ADMISSION:  04/03/2017 ADMITTING PHYSICIAN: Lance Coon, MD  DATE OF DISCHARGE: 04/05/2017  5:46 PM  PRIMARY CARE PHYSICIAN: Guadalupe Maple, MD   ADMISSION DIAGNOSIS:  Unstable angina ([place]) [I20.0]  DISCHARGE DIAGNOSIS:  Principal Problem:   Chest pain Active Problems:   COPD (chronic obstructive pulmonary disease) (HCC)   Hypercholesterolemia   DM type 2 with diabetic peripheral neuropathy (HCC)   GERD (gastroesophageal reflux disease)   SECONDARY DIAGNOSIS:   Past Medical History:  Diagnosis Date  . Allergy   . Asthma   . C. difficile diarrhea   . Cancer [facility]) June 2016   liver cancer  . Chronic pain   . DDD (degenerative disc disease), cervical   . DDD (degenerative disc disease), lumbar   . Diabetes mellitus without complication ([place])   . GERD (gastroesophageal reflux disease)   . Headache    migraines - none since 02/17  . Hypertension   . Low blood sugar   . Myocardial infarction ([place])   . Stroke [facility])    'mini-stroke" 30 yrs ago. no deficits.  . Wears dentures    full upper and lower     ADMITTING HISTORY  HISTORY OF PRESENT ILLNESS:  Edward Schneider  is a 61 y.o. male who presents with Several weeks of chest pressure. Patient states that he does not like to come to the hospital and so was trying to ignore it. He has a prior history of heart disease and stent placement when he was in his 62s per his report. He states that the pressure became so significant tonight that he had to come to the ED for evaluation. Here he was placed on nitro drip which has alleviated his chest pain to about a 2 out of 10 per his rating. He is also placed on heparin drip. Initial workup was largely within normal limits. Hospitalists were called for admission.   HOSPITAL COURSE:   * Chest pain Patient was admitted on telemetry floor.   Initially there was concern regarding non-ST elevation MI.  He was started on a nitro drip and heparin drip.  With his troponin being normal EKG being normal cardiology stop the heparin and nitro drip.  He was continued on telemetry monitoring which did not show any arrhythmias.  Stress test was ordered which was a low risk scan.  Patient continued to have pain which was thought to be likely GERD.  He is being started on twice a day Protonix.  He will need to follow-up with GI if his symptoms do not improve in 2 weeks.  May need EGD.  Other comorbidities remained stable. Discharged home.  CONSULTS OBTAINED:  Treatment Team:  Teodoro Spray, MD  DRUG ALLERGIES:   Allergies  Allergen Reactions  . Bee Pollen Anaphylaxis    Died 3 times when stung by bees. Carries Epi-pen at all times.  . Crestor [Rosuvastatin Calcium] Shortness Of Breath and Swelling  . Shrimp [Shellfish Allergy] Anaphylaxis  . Fentanyl Itching    blisters  . Gabapentin Diarrhea    Severe diarrhea which caused incontinence, loss of appetite and weight loss.   . Morphine Itching  . Morphine And Related Itching  . Simvastatin Diarrhea  . Buprenorphine Hcl Itching    DISCHARGE MEDICATIONS:   Discharge Medication List as of 04/05/2017  5:33 PM  START taking these medications   Details  pantoprazole (PROTONIX) 40 MG tablet Take 1 tablet (40 mg total) by mouth 2 (two) times daily before a meal., Starting Mon 04/05/2017, Normal      CONTINUE these medications which have NOT CHANGED   Details  aspirin EC 81 MG tablet Take 81 mg by mouth daily., Historical Med    Aspirin-Acetaminophen-Caffeine (EXCEDRIN MIGRAINE PO) Take by mouth., Historical Med    canagliflozin (INVOKANA) 300 MG TABS tablet Take 1 tablet (300 mg total) by mouth daily before breakfast., Starting Mon 03/29/2017, Normal    diphenhydrAMINE (BENADRYL) 25 mg capsule Take 25 mg by mouth 2 (two) times daily. , Until Discontinued, Historical Med     DULoxetine (CYMBALTA) 30 MG capsule Take 1 capsule (30 mg total) by mouth 2 (two) times daily., Starting Mon 03/29/2017, Normal    metFORMIN (GLUCOPHAGE) 500 MG tablet Take 2 tablets (1,000 mg total) by mouth 2 (two) times daily with a meal., Starting Mon 03/29/2017, Normal    oxyCODONE (ROXICODONE) 5 MG immediate release tablet Limit one tablet by mouth 3-6  times per day if tolerated, Print    traZODone (DESYREL) 100 MG tablet Take 1 tablet (100 mg total) by mouth at bedtime as needed for sleep., Starting Mon 03/29/2017, Normal    diphenoxylate-atropine (LOMOTIL) 2.5-0.025 MG tablet Take 1 tablet by mouth 4 (four) times daily as needed for diarrhea or loose stools., Starting Tue 04/28/2016, Phone In    triamcinolone cream (KENALOG) 0.1 % Apply 1 application topically 2 (two) times daily., Starting Tue 10/06/2016, Normal      STOP taking these medications     omeprazole (PRILOSEC OTC) 20 MG tablet         Today   VITAL SIGNS:  Blood pressure 135/75, pulse (!) 54, temperature 97.4 F (36.3 C), temperature source Oral, resp. rate 18, height 5\' 8"  (1.727 m), weight 98.8 kg (217 lb 12.8 oz), SpO2 92 %.  I/O:  No intake or output data in the 24 hours ending 04/13/17 1545  PHYSICAL EXAMINATION:  Physical Exam  GENERAL:  61 y.o.-year-old patient lying in the bed with no acute distress.  LUNGS: Normal breath sounds bilaterally, no wheezing, rales,rhonchi or crepitation. No use of accessory muscles of respiration.  CARDIOVASCULAR: S1, S2 normal. No murmurs, rubs, or gallops.  ABDOMEN: Soft, non-tender, non-distended. Bowel sounds present. No organomegaly or mass.  NEUROLOGIC: Moves all 4 extremities. PSYCHIATRIC: The patient is alert and oriented x 3.  SKIN: No obvious rash, lesion, or ulcer.   DATA REVIEW:   CBC No results for input(s): WBC, HGB, HCT, PLT in the last 168 hours.  Chemistries  No results for input(s): NA, K, CL, CO2, GLUCOSE, BUN, CREATININE, CALCIUM, MG, AST, ALT,  ALKPHOS, BILITOT in the last 168 hours.  Invalid input(s): GFRCGP  Cardiac Enzymes No results for input(s): TROPONINI in the last 168 hours.  Microbiology Results  Results for orders placed or performed in visit on 03/12/17  Microscopic Examination     Status: None   Collection Time: 03/12/17  2:59 PM  Result Value Ref Range Status   WBC, UA None seen 0 - 5 /hpf Final   RBC, UA None seen 0 - 2 /hpf Final   Epithelial Cells (non renal) CANCELED      Comment: Test not performed  Result canceled by the ancillary    Bacteria, UA None seen None seen/Few Final    RADIOLOGY:  No results found.  Follow up with PCP in  1 week.  Management plans discussed with the patient, family and they are in agreement.  CODE STATUS:  Code Status History    Date Active Date Inactive Code Status Order ID Comments User Context   04/04/2017  2:57 AM 04/05/2017  8:51 PM Full Code [id number]  Lance Coon, MD Inpatient   12/15/2015  5:40 PM 12/17/2015  7:01 PM Full Code [id number]  Loletha Grayer, MD ED      TOTAL TIME TAKING CARE OF THIS PATIENT ON DAY OF DISCHARGE: more than 30 minutes.   Hillary Bow R M.D on 04/13/2017 at 3:45 PM  Between 7am to 6pm - Pager - [phone]  After 6pm go to [email or web address] - password [email or web address] [facility] [place] [facility]  Office  [phone]  CC: Primary care physician; Guadalupe Maple, MD  Note: This dictation was prepared with Dragon dictation along with smaller phrase technology. Any transcriptional errors that result from this process are unintentional.

## 2017-04-19 DIAGNOSIS — M5031 Other cervical disc degeneration,  high cervical region: Secondary | ICD-10-CM | POA: Diagnosis not present

## 2017-04-19 DIAGNOSIS — Z79891 Long term (current) use of opiate analgesic: Secondary | ICD-10-CM | POA: Diagnosis not present

## 2017-04-19 DIAGNOSIS — M47817 Spondylosis without myelopathy or radiculopathy, lumbosacral region: Secondary | ICD-10-CM | POA: Diagnosis not present

## 2017-04-19 DIAGNOSIS — M542 Cervicalgia: Secondary | ICD-10-CM | POA: Diagnosis not present

## 2017-04-19 DIAGNOSIS — M5033 Other cervical disc degeneration, cervicothoracic region: Secondary | ICD-10-CM | POA: Diagnosis not present

## 2017-04-19 DIAGNOSIS — M533 Sacrococcygeal disorders, not elsewhere classified: Secondary | ICD-10-CM | POA: Diagnosis not present

## 2017-04-19 DIAGNOSIS — M5416 Radiculopathy, lumbar region: Secondary | ICD-10-CM | POA: Diagnosis not present

## 2017-04-19 DIAGNOSIS — M791 Myalgia: Secondary | ICD-10-CM | POA: Diagnosis not present

## 2017-04-19 DIAGNOSIS — M5136 Other intervertebral disc degeneration, lumbar region: Secondary | ICD-10-CM | POA: Diagnosis not present

## 2017-05-04 ENCOUNTER — Encounter: Payer: Self-pay | Admitting: Family Medicine

## 2017-05-04 ENCOUNTER — Ambulatory Visit (INDEPENDENT_AMBULATORY_CARE_PROVIDER_SITE_OTHER): Payer: Medicare Other | Admitting: Family Medicine

## 2017-05-04 VITALS — BP 133/89 | HR 86 | Temp 98.2°F | Wt 219.0 lb

## 2017-05-04 DIAGNOSIS — L989 Disorder of the skin and subcutaneous tissue, unspecified: Secondary | ICD-10-CM

## 2017-05-04 DIAGNOSIS — R197 Diarrhea, unspecified: Secondary | ICD-10-CM | POA: Diagnosis not present

## 2017-05-04 MED ORDER — TRIAMCINOLONE ACETONIDE 0.1 % EX CREA: 1.0000 "application " | TOPICAL_CREAM | Freq: Two times a day (BID) | CUTANEOUS | 11 refills | Status: DC

## 2017-05-04 MED ORDER — SUCRALFATE 1 G PO TABS: 1.0000 g | ORAL_TABLET | Freq: Three times a day (TID) | ORAL | 0 refills | Status: DC

## 2017-05-04 NOTE — Progress Notes (Signed)
BP 133/89   Pulse 86   Temp 98.2 F (36.8 C)   Wt 219 lb (99.3 kg)   SpO2 97%   BMI 33.30 kg/m    Subjective:    Patient ID: Edward Schneider., male    DOB: 11/28/1955, 61 y.o.   MRN: [id number]  HPI: Edward Schneider. is a 61 y.o. male  Chief Complaint  Patient presents with  . Diarrhea    uncontrolled since he left the hospital in June. Throws up in his mouth. Has an appt with GI on 8/16. Had CDiff 3 years ago.  . Rash    arms, face, chest, neck. States dermatology gave him a cream and it hasn't helped but Dr.C gave him Triamcinolone and that worked.   Patient presents with severe diarrhea and intermittent epigastric abdominal pain since coming home from the hospital about 1 month ago. Was started at that time on lomotil by GI specialist and states he became very sick from this medicine. Took it the entire month, stopped over the weekend when the bottle ran out. Still having >10 watery stools daily. Solid foods worsen abdominal pain, so is on liquid diet. States this started with the lomotil but has not eased up since stopping. Denies fever, chills, inability to tolerate PO, bloody stools or vomit. Hx of c diff 3 years ago. No known hx of GI ulcers.   Also having a flare of his dry skin with sproradic lesions. Triamcinolone helped when he was using it. Saw Dermatology but the cream they gave did not work. Has been out of triamcinolone for a while now.   Past Medical History:  Diagnosis Date  . Allergy   . Asthma   . C. difficile diarrhea   . Cancer [facility]) June 2016   liver cancer  . Chronic pain   . DDD (degenerative disc disease), cervical   . DDD (degenerative disc disease), lumbar   . Diabetes mellitus without complication ([place])   . GERD (gastroesophageal reflux disease)   . Headache    migraines - none since 02/17  . Hypertension   . Low blood sugar   . Myocardial infarction ([place])   . Stroke [facility])    'mini-stroke" 30 yrs ago. no deficits.  . Wears dentures    full upper and lower   Social History   Social History  . Marital status: Married    Spouse name: N/A  . Number of children: N/A  . Years of education: N/A   Occupational History  . Not on file.   Social History Main Topics  . Smoking status: Former Smoker    Packs/day: 2.00    Years: 50.00    Types: Cigarettes    Quit date: 07/05/2015  . Smokeless tobacco: Never Used  . Alcohol use No  . Drug use: No  . Sexual activity: Not on file   Other Topics Concern  . Not on file   Social History Narrative  . No narrative on file   Relevant past medical, surgical, family and social history reviewed and updated as indicated. Interim medical history since our last visit reviewed. Allergies and medications reviewed and updated.  Review of Systems  Constitutional: Negative.   HENT: Negative.   Respiratory: Negative.   Gastrointestinal: Positive for abdominal pain, diarrhea, nausea and vomiting.  Genitourinary: Negative.   Musculoskeletal: Negative.   Skin: Positive for rash.  Neurological: Negative.   Psychiatric/Behavioral: Negative.    Per HPI unless specifically indicated above  Objective:    BP 133/89   Pulse 86   Temp 98.2 F (36.8 C)   Wt 219 lb (99.3 kg)   SpO2 97%   BMI 33.30 kg/m   Wt Readings from Last 3 Encounters:  05/04/17 219 lb (99.3 kg)  04/04/17 217 lb 12.8 oz (98.8 kg)  03/29/17 221 lb 12.8 oz (100.6 kg)    Physical Exam  Constitutional: He is oriented to person, place, and time. He appears well-developed and well-nourished.  HENT:  Head: Atraumatic.  Eyes: Pupils are equal, round, and reactive to light. Conjunctivae are normal.  Neck: Normal range of motion. Neck supple.  Cardiovascular: Normal rate, regular rhythm and normal heart sounds.   Pulmonary/Chest: Effort normal and breath sounds normal.  Abdominal: Bowel sounds are normal. He exhibits no mass. There is tenderness (Diffuse ttp limiting extensive exam). There is guarding.    Musculoskeletal: Normal range of motion.  Neurological: He is alert and oriented to person, place, and time.  Skin: Skin is warm and dry.  Sporadic scabbed skin lesions present across forearms Diffuse dry skin   Psychiatric: He has a normal mood and affect. His behavior is normal.  Nursing note and vitals reviewed.     Assessment & Plan:   Problem List Items Addressed This Visit      Musculoskeletal and Integument   Skin lesions, generalized   Relevant Medications   triamcinolone cream (KENALOG) 0.1 %    Other Visit Diagnoses    Diarrhea, unspecified type    -  Primary   Relevant Orders   Stool C-Diff Toxin Assay   Helicobacter pylori abs-IgG+IgA, bld (Completed)   UA/M w/rflx Culture, Routine   CBC with Differential/Platelet (Completed)   Comprehensive metabolic panel (Completed)   Lipase (Completed)    Will test for c diff, h pylori, and get some basic labs. Carafate, imodium, and protonix in the meantime for comfort. Continue BRAT diet and push fluids. Strict return precautions given, particularly as lab turn around has been extended a few days at present d/t technical issues. Pt understanding and agreeable.   Follow up plan: Return if symptoms worsen or fail to improve.

## 2017-05-07 ENCOUNTER — Encounter: Payer: Self-pay | Admitting: Family Medicine

## 2017-05-07 ENCOUNTER — Telehealth: Payer: Self-pay | Admitting: Family Medicine

## 2017-05-07 LAB — COMPREHENSIVE METABOLIC PANEL
A/G RATIO: 1.4 (ref 1.2–2.2)
ALK PHOS: 83 IU/L (ref 39–117)
ALT: 29 IU/L (ref 0–44)
AST: 35 IU/L (ref 0–40)
Albumin: 4.6 g/dL (ref 3.6–4.8)
BILIRUBIN TOTAL: 0.7 mg/dL (ref 0.0–1.2)
BUN/Creatinine Ratio: 13 (ref 10–24)
BUN: 13 mg/dL (ref 8–27)
CALCIUM: 9.7 mg/dL (ref 8.6–10.2)
CHLORIDE: 98 mmol/L (ref 96–106)
CO2: 21 mmol/L (ref 20–29)
Creatinine, Ser: 1 mg/dL (ref 0.76–1.27)
GFR calc Af Amer: 94 mL/min/{1.73_m2} (ref >59)
GFR calc non Af Amer: 81 mL/min/{1.73_m2} (ref >59)
GLOBULIN, TOTAL: 3.3 g/dL (ref 1.5–4.5)
Glucose: 107 mg/dL — ABNORMAL HIGH (ref 65–99)
Potassium: 4.7 mmol/L (ref 3.5–5.2)
SODIUM: 140 mmol/L (ref 134–144)
Total Protein: 7.9 g/dL (ref 6.0–8.5)

## 2017-05-07 LAB — HELICOBACTER PYLORI ABS-IGG+IGA, BLD: H. pylori, IgG AbS: <0.8 Index Value (ref 0.00–0.79)

## 2017-05-07 LAB — CBC WITH DIFFERENTIAL/PLATELET
BASOS: 0 %
Basophils Absolute: 0 10*3/uL (ref 0.0–0.2)
EOS (ABSOLUTE): 0.2 10*3/uL (ref 0.0–0.4)
Eos: 2 %
Hematocrit: 43.2 % (ref 37.5–51.0)
Hemoglobin: 14.7 g/dL (ref 13.0–17.7)
Immature Grans (Abs): 0 10*3/uL (ref 0.0–0.1)
Immature Granulocytes: 0 %
LYMPHS ABS: 3 10*3/uL (ref 0.7–3.1)
Lymphs: 32 %
MCH: 28.9 pg (ref 26.6–33.0)
MCHC: 34 g/dL (ref 31.5–35.7)
MCV: 85 fL (ref 79–97)
MONOS ABS: 1.1 10*3/uL — AB (ref 0.1–0.9)
Monocytes: 11 %
Neutrophils Absolute: 5.1 10*3/uL (ref 1.4–7.0)
Neutrophils: 55 %
PLATELETS: 232 10*3/uL (ref 150–379)
RBC: 5.09 x10E6/uL (ref 4.14–5.80)
RDW: 14.1 % (ref 12.3–15.4)
WBC: 9.5 10*3/uL (ref 3.4–10.8)

## 2017-05-07 LAB — LIPASE: LIPASE: 21 U/L (ref 13–78)

## 2017-05-07 NOTE — Telephone Encounter (Signed)
Routing to provider  

## 2017-05-07 NOTE — Telephone Encounter (Signed)
I think they are just checking C. Diff this time, not for parasites. It's OK

## 2017-05-07 NOTE — Telephone Encounter (Signed)
Patient called because he is concerned he only had 1 vial in his kit for the C-Diff and he normally has 3 vials.  [phone]

## 2017-05-07 NOTE — Telephone Encounter (Signed)
Patient notified

## 2017-05-12 ENCOUNTER — Telehealth: Payer: Self-pay | Admitting: Family Medicine

## 2017-05-12 NOTE — Telephone Encounter (Signed)
Pt called and stated he would like to say thank you. He has felt better since he started the medication on Saturday.

## 2017-05-12 NOTE — Telephone Encounter (Signed)
So glad to hear! 

## 2017-05-12 NOTE — Telephone Encounter (Signed)
Routing to provider  

## 2017-05-13 LAB — UA/M W/RFLX CULTURE, ROUTINE
Bilirubin, UA: NEGATIVE
KETONES UA: NEGATIVE
Leukocytes, UA: NEGATIVE
Nitrite, UA: NEGATIVE
Protein, UA: NEGATIVE
RBC, UA: NEGATIVE
SPEC GRAV UA: 1.025 (ref 1.005–1.030)
Urobilinogen, Ur: 0.2 mg/dL (ref 0.2–1.0)
pH, UA: 5.5 (ref 5.0–7.5)

## 2017-05-13 LAB — MICROSCOPIC EXAMINATION

## 2017-05-14 LAB — UA/M W/RFLX CULTURE, ROUTINE

## 2017-05-18 DIAGNOSIS — M791 Myalgia: Secondary | ICD-10-CM | POA: Diagnosis not present

## 2017-05-18 DIAGNOSIS — M47817 Spondylosis without myelopathy or radiculopathy, lumbosacral region: Secondary | ICD-10-CM | POA: Diagnosis not present

## 2017-05-18 DIAGNOSIS — M5416 Radiculopathy, lumbar region: Secondary | ICD-10-CM | POA: Diagnosis not present

## 2017-05-18 DIAGNOSIS — M533 Sacrococcygeal disorders, not elsewhere classified: Secondary | ICD-10-CM | POA: Diagnosis not present

## 2017-05-31 ENCOUNTER — Other Ambulatory Visit: Payer: Self-pay | Admitting: Family Medicine

## 2017-05-31 MED ORDER — SUCRALFATE 1 G PO TABS: 1.0000 g | ORAL_TABLET | Freq: Three times a day (TID) | ORAL | 1 refills | Status: DC

## 2017-05-31 MED ORDER — PANTOPRAZOLE SODIUM 40 MG PO TBEC: 40.0000 mg | DELAYED_RELEASE_TABLET | Freq: Two times a day (BID) | ORAL | 1 refills | Status: DC

## 2017-05-31 NOTE — Telephone Encounter (Signed)
Last (acute) OV: 05/04/17 Last routine OV: 03/29/17 Next OV: 07/12/17

## 2017-06-03 ENCOUNTER — Ambulatory Visit: Payer: Medicare Other | Admitting: Gastroenterology

## 2017-06-14 DIAGNOSIS — M533 Sacrococcygeal disorders, not elsewhere classified: Secondary | ICD-10-CM | POA: Diagnosis not present

## 2017-06-14 DIAGNOSIS — M5416 Radiculopathy, lumbar region: Secondary | ICD-10-CM | POA: Diagnosis not present

## 2017-06-14 DIAGNOSIS — M47817 Spondylosis without myelopathy or radiculopathy, lumbosacral region: Secondary | ICD-10-CM | POA: Diagnosis not present

## 2017-06-14 DIAGNOSIS — M791 Myalgia: Secondary | ICD-10-CM | POA: Diagnosis not present

## 2017-06-30 ENCOUNTER — Other Ambulatory Visit: Payer: Self-pay | Admitting: Family Medicine

## 2017-06-30 ENCOUNTER — Telehealth: Payer: Self-pay | Admitting: Family Medicine

## 2017-06-30 NOTE — Telephone Encounter (Signed)
Patient called to make sure his diabetic supplies sent to [facility] said they sent the request.  Just FYI   Thanks

## 2017-06-30 NOTE — Telephone Encounter (Signed)
They were sent over about an hour ago. Called to make pt aware. VM left

## 2017-07-12 ENCOUNTER — Ambulatory Visit (INDEPENDENT_AMBULATORY_CARE_PROVIDER_SITE_OTHER): Payer: Medicare Other | Admitting: Family Medicine

## 2017-07-12 ENCOUNTER — Telehealth: Payer: Self-pay

## 2017-07-12 ENCOUNTER — Encounter: Payer: Self-pay | Admitting: Family Medicine

## 2017-07-12 VITALS — BP 137/91 | HR 61 | Wt 225.0 lb

## 2017-07-12 DIAGNOSIS — Z23 Encounter for immunization: Secondary | ICD-10-CM

## 2017-07-12 DIAGNOSIS — G894 Chronic pain syndrome: Secondary | ICD-10-CM | POA: Diagnosis not present

## 2017-07-12 DIAGNOSIS — M5441 Lumbago with sciatica, right side: Secondary | ICD-10-CM | POA: Diagnosis not present

## 2017-07-12 DIAGNOSIS — E7800 Pure hypercholesterolemia, unspecified: Secondary | ICD-10-CM | POA: Diagnosis not present

## 2017-07-12 DIAGNOSIS — M5442 Lumbago with sciatica, left side: Secondary | ICD-10-CM | POA: Diagnosis not present

## 2017-07-12 DIAGNOSIS — G8929 Other chronic pain: Secondary | ICD-10-CM

## 2017-07-12 DIAGNOSIS — M5416 Radiculopathy, lumbar region: Secondary | ICD-10-CM | POA: Diagnosis not present

## 2017-07-12 DIAGNOSIS — E1142 Type 2 diabetes mellitus with diabetic polyneuropathy: Secondary | ICD-10-CM

## 2017-07-12 DIAGNOSIS — Z79891 Long term (current) use of opiate analgesic: Secondary | ICD-10-CM | POA: Diagnosis not present

## 2017-07-12 DIAGNOSIS — E119 Type 2 diabetes mellitus without complications: Secondary | ICD-10-CM

## 2017-07-12 LAB — BAYER DCA HB A1C WAIVED: HB A1C (BAYER DCA - WAIVED): 6.8 % (ref <7.0)

## 2017-07-12 MED ORDER — METFORMIN HCL 500 MG PO TABS: 1000.0000 mg | ORAL_TABLET | Freq: Two times a day (BID) | ORAL | 3 refills | Status: DC

## 2017-07-12 MED ORDER — ATORVASTATIN CALCIUM 20 MG PO TABS: 20.0000 mg | ORAL_TABLET | Freq: Every day | ORAL | 2 refills | Status: DC

## 2017-07-12 MED ORDER — PREGABALIN 50 MG PO CAPS: 50.0000 mg | ORAL_CAPSULE | Freq: Three times a day (TID) | ORAL | 2 refills | Status: DC

## 2017-07-12 NOTE — Assessment & Plan Note (Signed)
Diabetes stable we will add Lyrica

## 2017-07-12 NOTE — Telephone Encounter (Signed)
Pharmacy is requesting a 90 day supply of the following  Metformin 500mg  two twice a day Amt: 360 Atorvastatin 20mg  1 tablet daily Amt: 90

## 2017-07-12 NOTE — Patient Instructions (Signed)

## 2017-07-12 NOTE — Assessment & Plan Note (Signed)
Followed by Dr. Primus Bravo

## 2017-07-12 NOTE — Telephone Encounter (Signed)
Your patient 

## 2017-07-12 NOTE — Progress Notes (Signed)
BP (!) 137/91   Pulse 61   Wt 225 lb (102.1 kg)   SpO2 99%   BMI 34.21 kg/m    Subjective:    Patient ID: Edward Flesher., male    DOB: 03/25/1956, 61 y.o.   MRN: [id number]  HPI: Edward Berg. is a 61 y.o. male  Chief Complaint  Patient presents with  . Follow-up  . Diabetes  Patient's diabetes all in all doing okay bothered by diabetic peripheral neuropathy Dr. crisp is trying to adjust pain medications which may be helpful but not yet. Patient's wondering about other medications. Reviewed allergy list is had gabapentin previously with severe diarrhea. On reviewed don't see Lyrica. Also on review don't see any recent notes from Dr. crisp. For some reason not coming through the Epic chart are either I'm looking in the wrong place.  Relevant past medical, surgical, family and social history reviewed and updated as indicated. Interim medical history since our last visit reviewed. Allergies and medications reviewed and updated.  Review of Systems  Constitutional: Negative.   Respiratory: Negative.   Cardiovascular: Negative.     Per HPI unless specifically indicated above     Objective:    BP (!) 137/91   Pulse 61   Wt 225 lb (102.1 kg)   SpO2 99%   BMI 34.21 kg/m   Wt Readings from Last 3 Encounters:  07/12/17 225 lb (102.1 kg)  05/04/17 219 lb (99.3 kg)  04/04/17 217 lb 12.8 oz (98.8 kg)    Physical Exam  Constitutional: He is oriented to person, place, and time. He appears well-developed and well-nourished.  HENT:  Head: Normocephalic and atraumatic.  Eyes: Conjunctivae and EOM are normal.  Neck: Normal range of motion.  Cardiovascular: Normal rate, regular rhythm and normal heart sounds.   Pulmonary/Chest: Effort normal and breath sounds normal.  Musculoskeletal: Normal range of motion.  Neurological: He is alert and oriented to person, place, and time.  Skin: No erythema.  Psychiatric: He has a normal mood and affect. His behavior is normal. Judgment  and thought content normal.    Results for orders placed or performed in visit on 05/04/17  Microscopic Examination  Result Value Ref Range   WBC, UA 0-5 0 - 5 /hpf   RBC, UA 0-2 0 - 2 /hpf   Epithelial Cells (non renal) 8-50 0 - 10 /hpf  Helicobacter pylori abs-IgG+IgA, bld  Result Value Ref Range   H. pylori, IgA Abs <9.0 0.0 - 8.9 units   H. pylori, IgG AbS <0.80 0.00 - 0.79 Index Value  UA/M w/rflx Culture, Routine  Result Value Ref Range   Specific Gravity, UA 1.025 1.005 - 1.030   pH, UA 5.5 5.0 - 7.5   Color, UA Yellow Yellow   Appearance Ur Clear Clear   Leukocytes, UA Negative Negative   Protein, UA Negative Negative/Trace   Glucose, UA 3+ (A) Negative   Ketones, UA Negative Negative   RBC, UA Negative Negative   Bilirubin, UA Negative Negative   Urobilinogen, Ur 0.2 0.2 - 1.0 mg/dL   Nitrite, UA Negative Negative   Microscopic Examination See below:   CBC with Differential/Platelet  Result Value Ref Range   WBC 9.5 3.4 - 10.8 x10E3/uL   RBC 5.09 4.14 - 5.80 x10E6/uL   Hemoglobin 14.7 13.0 - 17.7 g/dL   Hematocrit 43.2 37.5 - 51.0 %   MCV 85 79 - 97 fL   MCH 28.9 26.6 - 33.0 pg  MCHC 34.0 31.5 - 35.7 g/dL   RDW 14.1 12.3 - 15.4 %   Platelets 232 150 - 379 x10E3/uL   Neutrophils 55 Not Estab. %   Lymphs 32 Not Estab. %   Monocytes 11 Not Estab. %   Eos 2 Not Estab. %   Basos 0 Not Estab. %   Neutrophils Absolute 5.1 1.4 - 7.0 x10E3/uL   Lymphocytes Absolute 3.0 0.7 - 3.1 x10E3/uL   Monocytes Absolute 1.1 (H) 0.1 - 0.9 x10E3/uL   EOS (ABSOLUTE) 0.2 0.0 - 0.4 x10E3/uL   Basophils Absolute 0.0 0.0 - 0.2 x10E3/uL   Immature Granulocytes 0 Not Estab. %   Immature Grans (Abs) 0.0 0.0 - 0.1 x10E3/uL  Comprehensive metabolic panel  Result Value Ref Range   Glucose 107 (H) 65 - 99 mg/dL   BUN 13 8 - 27 mg/dL   Creatinine, Ser 1.00 0.76 - 1.27 mg/dL   GFR calc non Af Amer 81 >59 mL/min/1.73   GFR calc Af Amer 94 >59 mL/min/1.73   BUN/Creatinine Ratio 13 10 -  24   Sodium 140 134 - 144 mmol/L   Potassium 4.7 3.5 - 5.2 mmol/L   Chloride 98 96 - 106 mmol/L   CO2 21 20 - 29 mmol/L   Calcium 9.7 8.6 - 10.2 mg/dL   Total Protein 7.9 6.0 - 8.5 g/dL   Albumin 4.6 3.6 - 4.8 g/dL   Globulin, Total 3.3 1.5 - 4.5 g/dL   Albumin/Globulin Ratio 1.4 1.2 - 2.2   Bilirubin Total 0.7 0.0 - 1.2 mg/dL   Alkaline Phosphatase 83 39 - 117 IU/L   AST 35 0 - 40 IU/L   ALT 29 0 - 44 IU/L  Lipase  Result Value Ref Range   Lipase 21 13 - 78 U/L  UA/M w/rflx Culture, Routine  Result Value Ref Range   Specific Gravity, UA CANCELED    pH, UA CANCELED    Protein, UA CANCELED    Glucose, UA CANCELED    Ketones, UA CANCELED       Assessment & Plan:   Problem List Items Addressed This Visit      Endocrine   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 Primus Bravo      Hypercholesterolemia   Relevant Orders   Bayer DCA Hb A1c Waived    Other Visit Diagnoses    Needs flu shot       Relevant Orders   Flu Vaccine QUAD 6+ mos PF IM (Fluarix Quad PF) (Completed)       Follow up plan: Return in about 3 months (around 10/11/2017) for Hemoglobin A1c, BMP,  Lipids, ALT, AST.

## 2017-07-14 ENCOUNTER — Ambulatory Visit: Payer: Medicare Other | Admitting: Gastroenterology

## 2017-07-23 ENCOUNTER — Telehealth: Payer: Self-pay | Admitting: Family Medicine

## 2017-07-23 NOTE — Telephone Encounter (Signed)
Patient called to speak with CMA about recent lab results. Patient states he received a call after 5 07/22/2017 and missed the call and was returning the call.  Please Advise.  Thank you

## 2017-07-23 NOTE — Telephone Encounter (Signed)
Called and let pt know that we did not call him about results

## 2017-07-29 ENCOUNTER — Other Ambulatory Visit: Payer: Self-pay | Admitting: Family Medicine

## 2017-08-09 DIAGNOSIS — G629 Polyneuropathy, unspecified: Secondary | ICD-10-CM | POA: Diagnosis not present

## 2017-08-09 DIAGNOSIS — Z5181 Encounter for therapeutic drug level monitoring: Secondary | ICD-10-CM | POA: Diagnosis not present

## 2017-08-09 DIAGNOSIS — M179 Osteoarthritis of knee, unspecified: Secondary | ICD-10-CM | POA: Diagnosis not present

## 2017-08-09 DIAGNOSIS — M5136 Other intervertebral disc degeneration, lumbar region: Secondary | ICD-10-CM | POA: Diagnosis not present

## 2017-08-11 ENCOUNTER — Ambulatory Visit: Payer: Medicare Other | Admitting: Gastroenterology

## 2017-09-06 DIAGNOSIS — Z5181 Encounter for therapeutic drug level monitoring: Secondary | ICD-10-CM | POA: Diagnosis not present

## 2017-09-06 DIAGNOSIS — E11610 Type 2 diabetes mellitus with diabetic neuropathic arthropathy: Secondary | ICD-10-CM | POA: Diagnosis not present

## 2017-09-06 DIAGNOSIS — G629 Polyneuropathy, unspecified: Secondary | ICD-10-CM | POA: Diagnosis not present

## 2017-09-06 DIAGNOSIS — M179 Osteoarthritis of knee, unspecified: Secondary | ICD-10-CM | POA: Diagnosis not present

## 2017-09-16 ENCOUNTER — Encounter: Payer: Self-pay | Admitting: *Deleted

## 2017-09-16 ENCOUNTER — Encounter (INDEPENDENT_AMBULATORY_CARE_PROVIDER_SITE_OTHER): Payer: Self-pay

## 2017-09-16 ENCOUNTER — Ambulatory Visit (INDEPENDENT_AMBULATORY_CARE_PROVIDER_SITE_OTHER): Payer: Medicare Other | Admitting: Gastroenterology

## 2017-09-16 ENCOUNTER — Other Ambulatory Visit: Payer: Self-pay

## 2017-09-16 ENCOUNTER — Ambulatory Visit: Payer: Medicare Other | Admitting: Gastroenterology

## 2017-09-16 ENCOUNTER — Encounter: Payer: Self-pay | Admitting: Gastroenterology

## 2017-09-16 VITALS — BP 143/77 | HR 64 | Ht 68.5 in | Wt 228.0 lb

## 2017-09-16 DIAGNOSIS — K2100 Gastro-esophageal reflux disease with esophagitis, without bleeding: Secondary | ICD-10-CM

## 2017-09-16 NOTE — Patient Instructions (Signed)
You are scheduled for a CT chest with and without contrast at [facility] location on Wednesday, Dec 5th @ 8:30am. Please arrive at 8:15am. You cannot have anything to eat prior to scan but you may have liquids.   If you need to reschedule this appointment for any reason, please contact central scheduling at [phone].

## 2017-09-16 NOTE — Progress Notes (Signed)
Primary Care Physician: Guadalupe Maple, MD  Primary Gastroenterologist:  Dr. Lucilla Lame  Chief Complaint  Patient presents with  . Gastroesophageal Reflux    HPI: Edward Schneider. is a 61 y.o. male here for recurrent chest pain. The patient states he was worked up for cardiac chest pain but it was negative and he was told to follow-up with me. The patient states that his pain is in the left side of his chest and present all the time. The patient states that he is not able to eat but does not report that food makes the symptoms any better or worse. He does report that the symptoms are exacerbated by a deep breath.  Current Outpatient Medications  Medication Sig Dispense Refill  . ACCU-CHEK AVIVA PLUS test strip USE TEST STRIP(S) TO CHECK GLUCOSE ONCE DAILY 100 each 12  . ACCU-CHEK SOFTCLIX LANCETS lancets USE TO CHECK GLUCOSE ONCE DAILY 100 each 2  . aspirin EC 81 MG tablet Take 162 mg by mouth daily.     . Aspirin-Acetaminophen-Caffeine (EXCEDRIN MIGRAINE PO) Take by mouth.    Marland Kitchen atorvastatin (LIPITOR) 20 MG tablet Take 1 tablet (20 mg total) by mouth daily. 90 tablet 2  . diphenhydrAMINE (BENADRYL) 25 mg capsule Take 25 mg by mouth 2 (two) times daily.     . DULoxetine (CYMBALTA) 30 MG capsule Take 1 capsule (30 mg total) by mouth 2 (two) times daily. 60 capsule 12  . Ginseng 100 MG CAPS Take by mouth.    Marland Kitchen JARDIANCE 25 MG TABS tablet Take 25 mg by mouth daily.    . metFORMIN (GLUCOPHAGE) 500 MG tablet Take 2 tablets (1,000 mg total) by mouth 2 (two) times daily with a meal. 360 tablet 3  . oxyCODONE (ROXICODONE) 5 MG immediate release tablet Limit one tablet by mouth 3-6  times per day if tolerated (Patient taking differently: Limit one tablet by mouth 3-6  times per day if tolerated, pt states he is taking 6 tablets daily) 170 tablet 0  . pantoprazole (PROTONIX) 40 MG tablet TAKE 1 TABLET BY MOUTH TWICE DAILY BEFORE  A  MEAL 60 tablet 1  . pregabalin (LYRICA) 50 MG capsule Take 1  capsule (50 mg total) by mouth 3 (three) times daily. 90 capsule 2  . sucralfate (CARAFATE) 1 g tablet TAKE 1 TABLET BY MOUTH 4 TIMES DAILY WITH MEALS AND AT BEDTIME 90 tablet 1  . traZODone (DESYREL) 100 MG tablet Take 1 tablet (100 mg total) by mouth at bedtime as needed for sleep. 90 tablet 4  . triamcinolone cream (KENALOG) 0.1 % Apply 1 application topically 2 (two) times daily. 80 g 11  . vitamin B-12 (CYANOCOBALAMIN) 1000 MCG tablet Take 1,000 mcg by mouth daily.    . diphenoxylate-atropine (LOMOTIL) 2.5-0.025 MG tablet Take 1 tablet by mouth 4 (four) times daily as needed for diarrhea or loose stools. (Patient not taking: Reported on 09/16/2017) 120 tablet 5   No current facility-administered medications for this visit.     Allergies as of 09/16/2017 - Review Complete 09/16/2017  Allergen Reaction Noted  . Bee pollen Anaphylaxis 04/25/2015  . Crestor [rosuvastatin calcium] Shortness Of Breath and Swelling 03/03/2017  . Shrimp [shellfish allergy] Anaphylaxis 04/16/2015  . Fentanyl Itching 03/27/2015  . Gabapentin Diarrhea 04/25/2015  . Morphine Itching 10/10/2015  . Morphine and related Itching 03/27/2015  . Simvastatin Diarrhea 04/25/2015  . Buprenorphine hcl Itching 10/10/2015    ROS:  General: Negative for anorexia, weight loss,  fever, chills, fatigue, weakness. ENT: Negative for hoarseness, difficulty swallowing , nasal congestion. CV: Negative for chest pain, angina, palpitations, dyspnea on exertion, peripheral edema.  Respiratory: Negative for dyspnea at rest, dyspnea on exertion, cough, sputum, wheezing.  GI: See history of present illness. GU:  Negative for dysuria, hematuria, urinary incontinence, urinary frequency, nocturnal urination.  Endo: Negative for unusual weight change.    Physical Examination:   BP (!) 143/77 (BP Location: Left Arm, Patient Position: Sitting, Cuff Size: Large)   Pulse 64   Ht 5' 8.5" (1.74 m)   Wt 228 lb (103.4 kg)   BMI 34.16 kg/m     General: Well-nourished, well-developed in no acute distress.  Eyes: No icterus. Conjunctivae pink. Mouth: Oropharyngeal mucosa moist and pink , no lesions erythema or exudate. Lungs: Clear to auscultation bilaterally. Non-labored. Heart: Regular rate and rhythm, no murmurs rubs or gallops.  Abdomen: Bowel sounds are normal, nontender, nondistended, no hepatosplenomegaly or masses, no abdominal bruits or hernia , no rebound or guarding.   Extremities: No lower extremity edema. No clubbing or deformities. Neuro: Alert and oriented x 3.  Grossly intact. Skin: Warm and dry, no jaundice.   Psych: Alert and cooperative, normal mood and affect.  Labs:    Imaging Studies: No results found.  Assessment and Plan:   [name]. is a 61 y.o. y/o male who comes in today with a history of chest pain that is noncardiac. The patient has not felt better with a PPI and Carafate. The patient states the pain is in the left side and worse when he breathes in. The patient will be set up for an upper endoscopy. He will be started on a trial of Dexilant. The patient has also been set up for CT scan of the chest with and without contrast. The patient has been explained the plan and agrees with it.    Lucilla Lame, MD. Marval Regal   Note: This dictation was prepared with Dragon dictation along with smaller phrase technology. Any transcriptional errors that result from this process are unintentional.

## 2017-09-17 NOTE — Discharge Instructions (Signed)
General Anesthesia, Adult, Care After °These instructions provide you with information about caring for yourself after your procedure. Your health care provider may also give you more specific instructions. Your treatment has been planned according to current medical practices, but problems sometimes occur. Call your health care provider if you have any problems or questions after your procedure. °What can I expect after the procedure? °After the procedure, it is common to have: °· Vomiting. °· A sore throat. °· Mental slowness. ° °It is common to feel: °· Nauseous. °· Cold or shivery. °· Sleepy. °· Tired. °· Sore or achy, even in parts of your body where you did not have surgery. ° °Follow these instructions at home: °For at least 24 hours after the procedure: °· Do not: °? Participate in activities where you could fall or become injured. °? Drive. °? Use heavy machinery. °? Drink alcohol. °? Take sleeping pills or medicines that cause drowsiness. °? Make important decisions or sign legal documents. °? Take care of children on your own. °· Rest. °Eating and drinking °· If you vomit, drink water, juice, or soup when you can drink without vomiting. °· Drink enough fluid to keep your urine clear or pale yellow. °· Make sure you have little or no nausea before eating solid foods. °· Follow the diet recommended by your health care provider. °General instructions °· Have a responsible adult stay with you until you are awake and alert. °· Return to your normal activities as told by your health care provider. Ask your health care provider what activities are safe for you. °· Take over-the-counter and prescription medicines only as told by your health care provider. °· If you smoke, do not smoke without supervision. °· Keep all follow-up visits as told by your health care provider. This is important. °Contact a health care provider if: °· You continue to have nausea or vomiting at home, and medicines are not helpful. °· You  cannot drink fluids or start eating again. °· You cannot urinate after 8-12 hours. °· You develop a skin rash. °· You have fever. °· You have increasing redness at the site of your procedure. °Get help right away if: °· You have difficulty breathing. °· You have chest pain. °· You have unexpected bleeding. °· You feel that you are having a life-threatening or urgent problem. °This information is not intended to replace advice given to you by your health care provider. Make sure you discuss any questions you have with your health care provider. °Document Released: 01/11/2001 Document Revised: 03/09/2016 Document Reviewed: 09/19/2015 °Elsevier Interactive Patient Education © 2018 [employer] ° °

## 2017-09-20 ENCOUNTER — Ambulatory Visit: Payer: Medicare Other | Admitting: Anesthesiology

## 2017-09-20 ENCOUNTER — Encounter
Admission: RE | Disposition: A | Discharge status: Home / Self Care | Payer: Self-pay | Source: Ambulatory Visit | Attending: Gastroenterology

## 2017-09-20 ENCOUNTER — Ambulatory Visit
Admission: RE | Admit: 2017-09-20 | Discharge: 2017-09-20 | Disposition: A | Discharge status: Home / Self Care | Payer: Medicare Other | Source: Ambulatory Visit | Attending: Gastroenterology | Admitting: Gastroenterology

## 2017-09-20 DIAGNOSIS — Z7982 Long term (current) use of aspirin: Secondary | ICD-10-CM | POA: Insufficient documentation

## 2017-09-20 DIAGNOSIS — Z87891 Personal history of nicotine dependence: Secondary | ICD-10-CM | POA: Diagnosis not present

## 2017-09-20 DIAGNOSIS — R0789 Other chest pain: Secondary | ICD-10-CM

## 2017-09-20 DIAGNOSIS — Z8505 Personal history of malignant neoplasm of liver: Secondary | ICD-10-CM | POA: Insufficient documentation

## 2017-09-20 DIAGNOSIS — Z79891 Long term (current) use of opiate analgesic: Secondary | ICD-10-CM | POA: Diagnosis not present

## 2017-09-20 DIAGNOSIS — K2100 Gastro-esophageal reflux disease with esophagitis, without bleeding: Secondary | ICD-10-CM

## 2017-09-20 DIAGNOSIS — I10 Essential (primary) hypertension: Secondary | ICD-10-CM | POA: Insufficient documentation

## 2017-09-20 DIAGNOSIS — I252 Old myocardial infarction: Secondary | ICD-10-CM | POA: Diagnosis not present

## 2017-09-20 DIAGNOSIS — E1142 Type 2 diabetes mellitus with diabetic polyneuropathy: Secondary | ICD-10-CM | POA: Diagnosis not present

## 2017-09-20 DIAGNOSIS — K219 Gastro-esophageal reflux disease without esophagitis: Secondary | ICD-10-CM | POA: Diagnosis not present

## 2017-09-20 DIAGNOSIS — Z79899 Other long term (current) drug therapy: Secondary | ICD-10-CM | POA: Diagnosis not present

## 2017-09-20 DIAGNOSIS — Z7984 Long term (current) use of oral hypoglycemic drugs: Secondary | ICD-10-CM | POA: Diagnosis not present

## 2017-09-20 DIAGNOSIS — Z8673 Personal history of transient ischemic attack (TIA), and cerebral infarction without residual deficits: Secondary | ICD-10-CM | POA: Diagnosis not present

## 2017-09-20 HISTORY — DX: Unspecified convulsions: R56.9

## 2017-09-20 HISTORY — PX: ESOPHAGOGASTRODUODENOSCOPY (EGD) WITH PROPOFOL: SHX5813

## 2017-09-20 LAB — GLUCOSE, CAPILLARY
Glucose-Capillary: 172 mg/dL — ABNORMAL HIGH (ref 65–99)
Glucose-Capillary: 149 mg/dL — ABNORMAL HIGH (ref 65–99)

## 2017-09-20 SURGERY — ESOPHAGOGASTRODUODENOSCOPY (EGD) WITH PROPOFOL
Anesthesia: General | Wound class: Clean Contaminated

## 2017-09-20 MED ORDER — ACETAMINOPHEN 325 MG PO TABS: 650.0000 mg | ORAL_TABLET | Freq: Once | ORAL | Status: DC | PRN

## 2017-09-20 MED ORDER — LACTATED RINGERS IV SOLN
INTRAVENOUS | Status: DC
  Administered 2017-09-20: 08:00:00 via INTRAVENOUS

## 2017-09-20 MED ORDER — LIDOCAINE HCL (CARDIAC) 20 MG/ML IV SOLN
INTRAVENOUS | Status: DC | PRN
  Administered 2017-09-20: 50 mg via INTRAVENOUS

## 2017-09-20 MED ORDER — PROPOFOL 10 MG/ML IV BOLUS
INTRAVENOUS | Status: DC | PRN
  Administered 2017-09-20: 50 mg via INTRAVENOUS
  Administered 2017-09-20: 100 mg via INTRAVENOUS

## 2017-09-20 MED ORDER — SODIUM CHLORIDE 0.9 % IV SOLN: INTRAVENOUS | Status: DC

## 2017-09-20 MED ORDER — GLYCOPYRROLATE 0.2 MG/ML IJ SOLN
INTRAMUSCULAR | Status: DC | PRN
  Administered 2017-09-20: 0.2 mg via INTRAVENOUS

## 2017-09-20 MED ORDER — ONDANSETRON HCL 4 MG/2ML IJ SOLN: 4.0000 mg | Freq: Once | INTRAMUSCULAR | Status: DC | PRN

## 2017-09-20 MED ORDER — ACETAMINOPHEN 160 MG/5ML PO SOLN: 325.0000 mg | ORAL | Status: DC | PRN

## 2017-09-20 SURGICAL SUPPLY — 32 items
BALLN DILATOR 10-12 8 (BALLOONS)
BALLN DILATOR 12-15 8 (BALLOONS)
BALLN DILATOR 15-18 8 (BALLOONS)
BALLN DILATOR CRE 0-12 8 (BALLOONS)
BALLN DILATOR ESOPH 8 10 CRE (MISCELLANEOUS) IMPLANT
BALLOON DILATOR 12-15 8 (BALLOONS) IMPLANT
BALLOON DILATOR 15-18 8 (BALLOONS) IMPLANT
BALLOON DILATOR CRE 0-12 8 (BALLOONS) IMPLANT
BLOCK BITE 60FR ADLT L/F GRN (MISCELLANEOUS) ×3 IMPLANT
CANISTER SUCT 1200ML W/VALVE (MISCELLANEOUS) ×3 IMPLANT
CLIP HMST 235XBRD CATH ROT (MISCELLANEOUS) IMPLANT
CLIP RESOLUTION 360 11X235 (MISCELLANEOUS)
FCP ESCP3.2XJMB 240X2.8X (MISCELLANEOUS)
FORCEPS BIOP RAD 4 LRG CAP 4 (CUTTING FORCEPS) IMPLANT
FORCEPS BIOP RJ4 240 W/NDL (MISCELLANEOUS)
FORCEPS ESCP3.2XJMB 240X2.8X (MISCELLANEOUS) IMPLANT
GOWN CVR UNV OPN BCK APRN NK (MISCELLANEOUS) ×2 IMPLANT
GOWN ISOL THUMB LOOP REG UNIV (MISCELLANEOUS) ×6
INJECTOR VARIJECT VIN23 (MISCELLANEOUS) IMPLANT
KIT DEFENDO VALVE AND CONN (KITS) IMPLANT
KIT ENDO PROCEDURE OLY (KITS) ×3 IMPLANT
MARKER SPOT ENDO TATTOO 5ML (MISCELLANEOUS) IMPLANT
PAD GROUND ADULT SPLIT (MISCELLANEOUS) IMPLANT
RETRIEVER NET PLAT FOOD (MISCELLANEOUS) IMPLANT
SNARE SHORT THROW 13M SML OVAL (MISCELLANEOUS) IMPLANT
SNARE SHORT THROW 30M LRG OVAL (MISCELLANEOUS) IMPLANT
SPOT EX ENDOSCOPIC TATTOO (MISCELLANEOUS)
SYR INFLATION 60ML (SYRINGE) IMPLANT
TRAP ETRAP POLY (MISCELLANEOUS) IMPLANT
VARIJECT INJECTOR VIN23 (MISCELLANEOUS)
WATER STERILE IRR 250ML POUR (IV SOLUTION) ×3 IMPLANT
WIRE CRE 18-20MM 8CM F G (MISCELLANEOUS) IMPLANT

## 2017-09-20 NOTE — Anesthesia Postprocedure Evaluation (Signed)
Anesthesia Post Note  Patient: Edward Schneider.  Procedure(s) Performed: ESOPHAGOGASTRODUODENOSCOPY (EGD) WITH PROPOFOL (N/A )  Patient location during evaluation: PACU Anesthesia Type: General Level of consciousness: awake and alert, oriented and patient cooperative Pain management: pain level controlled Vital Signs Assessment: post-procedure vital signs reviewed and stable Respiratory status: spontaneous breathing, nonlabored ventilation and respiratory function stable Cardiovascular status: blood pressure returned to baseline and stable Postop Assessment: adequate PO intake Anesthetic complications: no    Darrin Nipper

## 2017-09-20 NOTE — Transfer of Care (Signed)
Immediate Anesthesia Transfer of Care Note  Patient: Edward Schneider.  Procedure(s) Performed: ESOPHAGOGASTRODUODENOSCOPY (EGD) WITH PROPOFOL (N/A )  Patient Location: PACU  Anesthesia Type: General  Level of Consciousness: awake, alert  and patient cooperative  Airway and Oxygen Therapy: Patient Spontanous Breathing and Patient connected to supplemental oxygen  Post-op Assessment: Post-op Vital signs reviewed, Patient's Cardiovascular Status Stable, Respiratory Function Stable, Patent Airway and No signs of Nausea or vomiting  Post-op Vital Signs: Reviewed and stable  Complications: No apparent anesthesia complications

## 2017-09-20 NOTE — Anesthesia Preprocedure Evaluation (Signed)
Anesthesia Evaluation  Patient identified by MRN, date of birth, ID band Patient awake    History of Anesthesia Complications Negative for: history of anesthetic complications  Airway Mallampati: I  TM Distance: >3 FB Neck ROM: Full    Dental  (+) Lower Dentures, Upper Dentures   Pulmonary former smoker (quit 2014),    Pulmonary exam normal breath sounds clear to auscultation       Cardiovascular Exercise Tolerance: Good hypertension, + Past MI (age 61)  Normal cardiovascular exam Rhythm:Regular Rate:Normal     Neuro/Psych Seizures - (childhoood; not on medication),  TIA (age 46; no deficits) Neuromuscular disease (peripheral neuropathy)    GI/Hepatic GERD  ,Hx liver CA   Endo/Other  diabetes, Type 2  Renal/GU negative Renal ROS     Musculoskeletal   Abdominal   Peds  Hematology negative hematology ROS (+)   Anesthesia Other Findings   Reproductive/Obstetrics                             Anesthesia Physical Anesthesia Plan  ASA: III  Anesthesia Plan: General   Post-op Pain Management:    Induction: Intravenous  PONV Risk Score and Plan: 1 and Propofol infusion  Airway Management Planned: Natural Airway  Additional Equipment:   Intra-op Plan:   Post-operative Plan:   Informed Consent: I have reviewed the patients History and Physical, chart, labs and discussed the procedure including the risks, benefits and alternatives for the proposed anesthesia with the patient or authorized representative who has indicated his/her understanding and acceptance.     Plan Discussed with: CRNA  Anesthesia Plan Comments:         Anesthesia Quick Evaluation

## 2017-09-20 NOTE — Op Note (Addendum)
[facility] Gastroenterology Patient Name: Edward Schneider Procedure Date: 09/20/2017 8:21 AM MRN: [id number] Account #: [id number] Date of Birth: [birth year] Admit Type: Outpatient Age: 61 Room: [facility] OR ROOM 01 Gender: Male Note Status: Supervisor Override Procedure:            Upper GI endoscopy Indications:          Chest pain (non cardiac) Providers:            Lucilla Lame MD, MD Referring MD:         Guadalupe Maple, MD (Referring MD) Medicines:            Propofol per Anesthesia Complications:        No immediate complications. Procedure:            Pre-Anesthesia Assessment:                       - Prior to the procedure, a History and Physical was                        performed, and patient medications and allergies were                        reviewed. The patient's tolerance of previous                        anesthesia was also reviewed. The risks and benefits of                        the procedure and the sedation options and risks were                        discussed with the patient. All questions were                        answered, and informed consent was obtained. Prior                        Anticoagulants: The patient has taken no previous                        anticoagulant or antiplatelet agents. ASA Grade                        Assessment: II - A patient with mild systemic disease.                        After reviewing the risks and benefits, the patient was                        deemed in satisfactory condition to undergo the                        procedure.                       After obtaining informed consent, the endoscope was                        passed under direct vision. Throughout the procedure,  the patient's blood pressure, pulse, and oxygen                        saturations were monitored continuously. The Olympus                        [id number]-[id number] Endoscope (S#. [id number]) was introduced            through the mouth, and advanced to the second part of                        duodenum. The upper GI endoscopy was accomplished                        without difficulty. The patient tolerated the procedure                        well. Findings:      The examined esophagus was normal.      A large amount of food (residue) was found in the entire examined       stomach.      The examined duodenum was normal. Impression:           - Normal esophagus.                       - A large amount of food (residue) in the stomach.                       - Normal examined duodenum.                       - No specimens collected. Recommendation:       - Discharge patient to home.                       - Resume previous diet.                       - Continue present medications.                       - Do a gastric emptying study. Procedure Code(s):    --- Professional ---                       [phone], Esophagogastroduodenoscopy, flexible, transoral;                        diagnostic, including collection of specimen(s) by                        brushing or washing, when performed (separate procedure) Diagnosis Code(s):    --- Professional ---                       R07.89, Other chest pain CPT copyright 2018 American Medical Association. All rights reserved. The codes documented in this report are preliminary and upon coder review may  be revised to meet current compliance requirements. Lucilla Lame MD, MD 09/20/2017 8:37:55 AM This report has been signed electronically. Number of Addenda: 0 Note Initiated On: 09/20/2017 8:21 AM      [facility]

## 2017-09-20 NOTE — Anesthesia Procedure Notes (Signed)
Procedure Name: MAC Performed by: Zoe Goonan, CRNA Pre-anesthesia Checklist: Patient identified, Emergency Drugs available, Suction available, Timeout performed and Patient being monitored Patient Re-evaluated:Patient Re-evaluated prior to induction Oxygen Delivery Method: Nasal cannula Placement Confirmation: positive ETCO2       

## 2017-09-20 NOTE — OR Nursing (Signed)
Food in the stomach.

## 2017-09-20 NOTE — H&P (Signed)
Edward Lame, MD [address] [place], [place] [address] Phone:[phone] Fax : [phone]  Primary Care Physician:  Guadalupe Maple, MD Primary Gastroenterologist:  Dr. Allen Norris  Pre-Procedure History & Physical: HPI:  Rayman Petrosian. is a 61 y.o. male is here for an endoscopy.   Past Medical History:  Diagnosis Date  . Allergy   . Asthma   . C. difficile diarrhea   . Cancer [facility]) June 2016   liver cancer  . Chronic pain   . DDD (degenerative disc disease), cervical   . DDD (degenerative disc disease), lumbar   . Diabetes mellitus without complication ([place])   . GERD (gastroesophageal reflux disease)   . Headache    migraines - none since 02/17  . Hypertension   . Low blood sugar   . Myocardial infarction ([place])   . Seizures ([place])    several as child when sick.  None since age 52  . Stroke [facility])    'mini-stroke" 30 yrs ago. no deficits.  . Wears dentures    full upper and lower    Past Surgical History:  Procedure Laterality Date  . APPENDECTOMY    . BACK SURGERY    . CARDIAC CATHETERIZATION     stent placed in his "30's"  . COLONOSCOPY WITH PROPOFOL N/A 03/06/2016   Procedure: COLONOSCOPY WITH PROPOFOL;  Surgeon: Edward Lame, MD;  Location: [place];  Service: Endoscopy;  Laterality: N/A;  requests early  . FINGER SURGERY Left   . KNEE SURGERY Right   . NECK SURGERY    . spleen surg    . spleen surgery    . TOE SURGERY Right     Prior to Admission medications   Medication Sig Start Date End Date Taking? Authorizing Provider  ACCU-CHEK AVIVA PLUS test strip USE TEST STRIP(S) TO CHECK GLUCOSE ONCE DAILY 06/30/17  Yes Crissman, Jeannette How, MD  ACCU-CHEK SOFTCLIX LANCETS lancets USE TO CHECK GLUCOSE ONCE DAILY 06/30/17  Yes Crissman, Jeannette How, MD  Aspirin-Acetaminophen-Caffeine (EXCEDRIN MIGRAINE PO) Take by mouth.   Yes [provider]  atorvastatin (LIPITOR) 20 MG tablet Take 1 tablet (20 mg total) by mouth daily. 07/12/17  Yes  Guadalupe Maple, MD  diphenhydrAMINE (BENADRYL) 25 mg capsule Take 25 mg by mouth 2 (two) times daily.    Yes [provider]  DULoxetine (CYMBALTA) 30 MG capsule Take 1 capsule (30 mg total) by mouth 2 (two) times daily. 03/29/17  Yes Crissman, Jeannette How, MD  Ginseng 100 MG CAPS Take by mouth.   Yes [provider]  JARDIANCE 25 MG TABS tablet Take 25 mg by mouth daily. 02/24/17  Yes [provider]  metFORMIN (GLUCOPHAGE) 500 MG tablet Take 2 tablets (1,000 mg total) by mouth 2 (two) times daily with a meal. 07/12/17  Yes Crissman, Jeannette How, MD  oxyCODONE (ROXICODONE) 5 MG immediate release tablet Limit one tablet by mouth 3-6  times per day if tolerated Patient taking differently: Limit one tablet by mouth 3-6  times per day if tolerated, pt states he is taking 6 tablets daily 06/11/16  Yes Mohammed Kindle, MD  pantoprazole (PROTONIX) 40 MG tablet TAKE 1 TABLET BY MOUTH TWICE DAILY BEFORE  A  MEAL 07/29/17  Yes Crissman, Jeannette How, MD  pregabalin (LYRICA) 50 MG capsule Take 1 capsule (50 mg total) by mouth 3 (three) times daily. 07/12/17  Yes Crissman, Jeannette How, MD  sucralfate (CARAFATE) 1 g tablet TAKE 1 TABLET BY  MOUTH 4 TIMES DAILY WITH MEALS AND AT BEDTIME 07/29/17  Yes Crissman, Jeannette How, MD  traZODone (DESYREL) 100 MG tablet Take 1 tablet (100 mg total) by mouth at bedtime as needed for sleep. 03/29/17  Yes Crissman, Jeannette How, MD  triamcinolone cream (KENALOG) 0.1 % Apply 1 application topically 2 (two) times daily. 05/04/17  Yes Volney American, PA-C  vitamin B-12 (CYANOCOBALAMIN) 1000 MCG tablet Take 1,000 mcg by mouth daily.   Yes [provider]  aspirin EC 81 MG tablet Take 162 mg by mouth daily.     [provider]  diphenoxylate-atropine (LOMOTIL) 2.5-0.025 MG tablet Take 1 tablet by mouth 4 (four) times daily as needed for diarrhea or loose stools. Patient not taking: Reported on 09/16/2017 04/28/16   Edward Lame, MD    Allergies as of 09/16/2017 -  Review Complete 09/16/2017  Allergen Reaction Noted  . Bee pollen Anaphylaxis 04/25/2015  . Crestor [rosuvastatin calcium] Shortness Of Breath and Swelling 03/03/2017  . Shrimp [shellfish allergy] Anaphylaxis 04/16/2015  . Fentanyl Itching 03/27/2015  . Gabapentin Diarrhea 04/25/2015  . Morphine Itching 10/10/2015  . Morphine and related Itching 03/27/2015  . Simvastatin Diarrhea 04/25/2015  . Buprenorphine hcl Itching 10/10/2015    Family History  Problem Relation Age of Onset  . Arthritis Mother   . Diabetes Mother   . Kidney disease Mother   . Heart disease Mother   . Hypertension Mother   . Arthritis Father   . Hearing loss Father   . Hypertension Father   . Diabetes Sister   . Heart disease Sister   . Diabetes Daughter   . Diabetes Maternal Aunt   . Diabetes Maternal Grandmother     Social History   Socioeconomic History  . Marital status: Married    Spouse name: Not on file  . Number of children: Not on file  . Years of education: Not on file  . Highest education level: Not on file  Social Needs  . Financial resource strain: Not on file  . Food insecurity - worry: Not on file  . Food insecurity - inability: Not on file  . Transportation needs - medical: Not on file  . Transportation needs - non-medical: Not on file  Occupational History  . Not on file  Tobacco Use  . Smoking status: Former Smoker    Packs/day: 2.00    Years: 50.00    Pack years: 100.00    Types: Cigarettes    Last attempt to quit: 07/05/2015    Years since quitting: 2.2  . Smokeless tobacco: Never Used  Substance and Sexual Activity  . Alcohol use: No    Alcohol/week: 0.0 oz  . Drug use: No  . Sexual activity: Not on file  Other Topics Concern  . Not on file  Social History Narrative  . Not on file    Review of Systems: See HPI, otherwise negative ROS  Physical Exam: BP (!) 132/91   Pulse 64   Temp 97.7 F (36.5 C) (Temporal)   Resp 16   Ht 5\' 8"  (1.727 m)   Wt 224 lb  (101.6 kg)   SpO2 96%   BMI 34.06 kg/m  General:   Alert,  pleasant and cooperative in NAD Head:  Normocephalic and atraumatic. Neck:  Supple; no masses or thyromegaly. Lungs:  Clear throughout to auscultation.    Heart:  Regular rate and rhythm. Abdomen:  Soft, nontender and nondistended. Normal bowel sounds, without guarding, and without rebound.  Neurologic:  Alert and  oriented x4;  grossly normal neurologically.  Impression/Plan: [name] is here for an endoscopy to be performed for atypical chest pain  Risks, benefits, limitations, and alternatives regarding  endoscopy have been reviewed with the patient.  Questions have been answered.  All parties agreeable.   Edward Lame, MD  09/20/2017, 7:56 AM

## 2017-09-21 ENCOUNTER — Encounter: Payer: Self-pay | Admitting: Gastroenterology

## 2017-09-22 ENCOUNTER — Ambulatory Visit
Admission: RE | Admit: 2017-09-22 | Discharge: 2017-09-22 | Disposition: A | Discharge status: Home / Self Care | Payer: Medicare Other | Source: Ambulatory Visit | Attending: Gastroenterology | Admitting: Gastroenterology

## 2017-09-22 DIAGNOSIS — K760 Fatty (change of) liver, not elsewhere classified: Secondary | ICD-10-CM | POA: Diagnosis not present

## 2017-09-22 DIAGNOSIS — K2100 Gastro-esophageal reflux disease with esophagitis, without bleeding: Secondary | ICD-10-CM

## 2017-09-22 DIAGNOSIS — R918 Other nonspecific abnormal finding of lung field: Secondary | ICD-10-CM | POA: Diagnosis not present

## 2017-09-22 DIAGNOSIS — I700 Atherosclerosis of aorta: Secondary | ICD-10-CM | POA: Diagnosis not present

## 2017-09-22 DIAGNOSIS — I2510 Atherosclerotic heart disease of native coronary artery without angina pectoris: Secondary | ICD-10-CM | POA: Diagnosis not present

## 2017-09-22 LAB — POCT I-STAT CREATININE: Creatinine, Ser: 1 mg/dL (ref 0.61–1.24)

## 2017-09-22 MED ORDER — IOPAMIDOL (ISOVUE-300) INJECTION 61%
75.0000 mL | Freq: Once | INTRAVENOUS | Status: AC | PRN
  Administered 2017-09-22: 75 mL via INTRAVENOUS

## 2017-09-23 DIAGNOSIS — Z5181 Encounter for therapeutic drug level monitoring: Secondary | ICD-10-CM | POA: Diagnosis not present

## 2017-09-23 DIAGNOSIS — G629 Polyneuropathy, unspecified: Secondary | ICD-10-CM | POA: Diagnosis not present

## 2017-09-23 DIAGNOSIS — E11610 Type 2 diabetes mellitus with diabetic neuropathic arthropathy: Secondary | ICD-10-CM | POA: Diagnosis not present

## 2017-09-23 DIAGNOSIS — M179 Osteoarthritis of knee, unspecified: Secondary | ICD-10-CM | POA: Diagnosis not present

## 2017-09-29 ENCOUNTER — Telehealth: Payer: Self-pay | Admitting: Family Medicine

## 2017-09-29 ENCOUNTER — Telehealth: Payer: Self-pay | Admitting: Gastroenterology

## 2017-09-29 ENCOUNTER — Other Ambulatory Visit: Payer: Self-pay

## 2017-09-29 MED ORDER — DIPHENOXYLATE-ATROPINE 2.5-0.025 MG PO TABS: 1.0000 | ORAL_TABLET | Freq: Four times a day (QID) | ORAL | 1 refills | Status: DC | PRN

## 2017-09-29 NOTE — Telephone Encounter (Signed)
Pt says that he is completely out of his medication and would like to have it sent in as soon as possible.

## 2017-09-29 NOTE — Telephone Encounter (Signed)
Patient called and is having severe diarrhea to the point of having accidents. What can he do? Please call.

## 2017-09-29 NOTE — Telephone Encounter (Signed)
-----   Message from Lucilla Lame, MD sent at 09/24/2017  3:07 PM EST ----- The patient know that the CT scan of his chest did not show any sign of a cause for his pain.  There was fatty liver seen on the CT scan

## 2017-09-29 NOTE — Telephone Encounter (Signed)
Pt notified of CT scan results.  Also, discussed with pt his recent diarrhea. Pt stated it started once he started taking the Dexilant daily. Pt has been advised to stop the Dexilant and restart the Lomotil until his stools get more solid. Requested to pt he contact me if this doesn't improve.

## 2017-10-18 ENCOUNTER — Ambulatory Visit: Payer: Medicare Other | Admitting: Family Medicine

## 2017-10-26 ENCOUNTER — Ambulatory Visit: Payer: Medicare Other | Admitting: Family Medicine

## 2017-11-01 DIAGNOSIS — M179 Osteoarthritis of knee, unspecified: Secondary | ICD-10-CM | POA: Diagnosis not present

## 2017-11-01 DIAGNOSIS — Z5181 Encounter for therapeutic drug level monitoring: Secondary | ICD-10-CM | POA: Diagnosis not present

## 2017-11-01 DIAGNOSIS — G629 Polyneuropathy, unspecified: Secondary | ICD-10-CM | POA: Diagnosis not present

## 2017-11-01 DIAGNOSIS — E11610 Type 2 diabetes mellitus with diabetic neuropathic arthropathy: Secondary | ICD-10-CM | POA: Diagnosis not present

## 2017-11-03 ENCOUNTER — Telehealth: Payer: Self-pay

## 2017-11-03 ENCOUNTER — Encounter: Payer: Self-pay | Admitting: Family Medicine

## 2017-11-03 ENCOUNTER — Ambulatory Visit (INDEPENDENT_AMBULATORY_CARE_PROVIDER_SITE_OTHER): Payer: Medicare Other | Admitting: Family Medicine

## 2017-11-03 VITALS — BP 136/87 | HR 74 | Wt 211.0 lb

## 2017-11-03 DIAGNOSIS — E7800 Pure hypercholesterolemia, unspecified: Secondary | ICD-10-CM

## 2017-11-03 DIAGNOSIS — E1142 Type 2 diabetes mellitus with diabetic polyneuropathy: Secondary | ICD-10-CM | POA: Diagnosis not present

## 2017-11-03 DIAGNOSIS — Z598 Other problems related to housing and economic circumstances: Secondary | ICD-10-CM

## 2017-11-03 DIAGNOSIS — Z599 Problem related to housing and economic circumstances, unspecified: Secondary | ICD-10-CM

## 2017-11-03 LAB — LP+ALT+AST PICCOLO, WAIVED
ALT (SGPT) PICCOLO, WAIVED: 58 U/L — AB (ref 10–47)
AST (SGOT) PICCOLO, WAIVED: 51 U/L — AB (ref 11–38)
Chol/HDL Ratio Piccolo,Waive: 3.2 mg/dL
Cholesterol Piccolo, Waived: 152 mg/dL (ref <200)
HDL CHOL PICCOLO, WAIVED: 48 mg/dL — AB (ref >59)
LDL Chol Calc Piccolo Waived: 64 mg/dL (ref <100)
Triglycerides Piccolo,Waived: 203 mg/dL — ABNORMAL HIGH (ref <150)
VLDL Chol Calc Piccolo,Waive: 41 mg/dL — ABNORMAL HIGH (ref <30)

## 2017-11-03 LAB — BAYER DCA HB A1C WAIVED: HB A1C: 7 % — AB (ref <7.0)

## 2017-11-03 MED ORDER — DULOXETINE HCL 60 MG PO CPEP: 60.0000 mg | ORAL_CAPSULE | Freq: Every day | ORAL | 3 refills | Status: DC

## 2017-11-03 MED ORDER — PANTOPRAZOLE SODIUM 40 MG PO TBEC: 40.0000 mg | DELAYED_RELEASE_TABLET | Freq: Two times a day (BID) | ORAL | 3 refills | Status: DC

## 2017-11-03 MED ORDER — SUCRALFATE 1 G PO TABS: 1.0000 g | ORAL_TABLET | Freq: Three times a day (TID) | ORAL | 3 refills | Status: DC

## 2017-11-03 NOTE — Assessment & Plan Note (Signed)
The current medical regimen is effective;  continue present plan and medications.  

## 2017-11-03 NOTE — Telephone Encounter (Signed)
Patient wants information about med assistance on jardiance and lyrica. Connected care referral made

## 2017-11-03 NOTE — Progress Notes (Signed)
   BP 136/87   Pulse 74   Wt 211 lb (95.7 kg)   SpO2 98%   BMI 32.08 kg/m    Subjective:    Patient ID: [name], male    DOB: 07/21/1956, 62 y.o.   MRN: [id number]  HPI: Davionne Dowty. is a 62 y.o. male  Chief Complaint  Patient presents with  . Follow-up  . Hypertension  . Hyperlipidemia  . Diabetes   Patient all in all from diabetes hypertension hypercholesterol doing okay has had some markedly elevated blood pressures but is now better.  Patient has had some GI difficulty working with GI and nutrition to see if I can get better is having to be on a very limited diet for now and consequently has lost significant weight. Patient with no low blood sugar spells or issues from medications. Relevant past medical, surgical, family and social history reviewed and updated as indicated. Interim medical history since our last visit reviewed. Allergies and medications reviewed and updated.  Review of Systems  Constitutional: Negative.   Respiratory: Negative.   Cardiovascular: Negative.     Per HPI unless specifically indicated above     Objective:    BP 136/87   Pulse 74   Wt 211 lb (95.7 kg)   SpO2 98%   BMI 32.08 kg/m   Wt Readings from Last 3 Encounters:  11/03/17 211 lb (95.7 kg)  09/20/17 224 lb (101.6 kg)  09/16/17 228 lb (103.4 kg)    Physical Exam  Constitutional: He is oriented to person, place, and time. He appears well-developed and well-nourished.  HENT:  Head: Normocephalic and atraumatic.  Eyes: Conjunctivae and EOM are normal.  Neck: Normal range of motion.  Cardiovascular: Normal rate, regular rhythm and normal heart sounds.  Pulmonary/Chest: Effort normal and breath sounds normal.  Musculoskeletal: Normal range of motion.  Neurological: He is alert and oriented to person, place, and time.  Skin: No erythema.  Psychiatric: He has a normal mood and affect. His behavior is normal. Judgment and thought content normal.    Results for  orders placed or performed during the hospital encounter of 09/22/17  I-STAT creatinine  Result Value Ref Range   Creatinine, Ser 1.00 0.61 - 1.24 mg/dL      Assessment & Plan:   Problem List Items Addressed This Visit      Endocrine   DM type 2 with diabetic peripheral neuropathy ([place]) - Primary    The current medical regimen is effective;  continue present plan and medications.       Relevant Orders   Bayer DCA Hb A1c Waived   Basic metabolic panel   LP+ALT+AST Piccolo, Waived     Other   Hypercholesterolemia    The current medical regimen is effective;  continue present plan and medications.       Relevant Orders   Bayer DCA Hb A1c Waived   Basic metabolic panel   LP+ALT+AST Piccolo, Waived     BP controled  Follow up plan: Return in about 3 months (around 02/01/2018) for Hemoglobin A1c.

## 2017-11-04 ENCOUNTER — Encounter: Payer: Self-pay | Admitting: Family Medicine

## 2017-11-04 LAB — BASIC METABOLIC PANEL
BUN/Creatinine Ratio: 17 (ref 10–24)
BUN: 16 mg/dL (ref 8–27)
CO2: 24 mmol/L (ref 20–29)
Calcium: 9.8 mg/dL (ref 8.6–10.2)
Chloride: 98 mmol/L (ref 96–106)
Creatinine, Ser: 0.92 mg/dL (ref 0.76–1.27)
GFR calc Af Amer: 104 mL/min/{1.73_m2} (ref >59)
GFR, EST NON AFRICAN AMERICAN: 90 mL/min/{1.73_m2} (ref >59)
GLUCOSE: 170 mg/dL — AB (ref 65–99)
Potassium: 4.9 mmol/L (ref 3.5–5.2)
SODIUM: 138 mmol/L (ref 134–144)

## 2017-11-30 DIAGNOSIS — G629 Polyneuropathy, unspecified: Secondary | ICD-10-CM | POA: Diagnosis not present

## 2017-11-30 DIAGNOSIS — M179 Osteoarthritis of knee, unspecified: Secondary | ICD-10-CM | POA: Diagnosis not present

## 2017-11-30 DIAGNOSIS — E11610 Type 2 diabetes mellitus with diabetic neuropathic arthropathy: Secondary | ICD-10-CM | POA: Diagnosis not present

## 2017-11-30 DIAGNOSIS — Z5181 Encounter for therapeutic drug level monitoring: Secondary | ICD-10-CM | POA: Diagnosis not present

## 2017-12-15 ENCOUNTER — Ambulatory Visit: Payer: Self-pay | Admitting: *Deleted

## 2017-12-15 ENCOUNTER — Ambulatory Visit (INDEPENDENT_AMBULATORY_CARE_PROVIDER_SITE_OTHER): Payer: Medicare Other | Admitting: Family Medicine

## 2017-12-15 ENCOUNTER — Encounter: Payer: Self-pay | Admitting: Family Medicine

## 2017-12-15 VITALS — BP 143/82 | HR 75 | Temp 97.5°F | Wt 229.5 lb

## 2017-12-15 DIAGNOSIS — R059 Cough, unspecified: Secondary | ICD-10-CM

## 2017-12-15 DIAGNOSIS — L989 Disorder of the skin and subcutaneous tissue, unspecified: Secondary | ICD-10-CM | POA: Diagnosis not present

## 2017-12-15 DIAGNOSIS — R109 Unspecified abdominal pain: Secondary | ICD-10-CM | POA: Diagnosis not present

## 2017-12-15 DIAGNOSIS — R05 Cough: Secondary | ICD-10-CM

## 2017-12-15 LAB — CBC WITH DIFFERENTIAL/PLATELET
Hematocrit: 40.1 % (ref 37.5–51.0)
Hemoglobin: 13.8 g/dL (ref 13.0–17.7)
Lymphocytes Absolute: 2.9 10*3/uL (ref 0.7–3.1)
Lymphs: 32 %
MCH: 29.9 pg (ref 26.6–33.0)
MCHC: 34.4 g/dL (ref 31.5–35.7)
MCV: 87 fL (ref 79–97)
MID (Absolute): 1.3 10*3/uL (ref 0.1–1.6)
MID: 14 %
Neutrophils Absolute: 4.7 10*3/uL (ref 1.4–7.0)
Neutrophils: 54 %
Platelets: 183 10*3/uL (ref 150–379)
RBC: 4.61 x10E6/uL (ref 4.14–5.80)
RDW: 14 % (ref 12.3–15.4)
WBC: 8.9 10*3/uL (ref 3.4–10.8)

## 2017-12-15 NOTE — Progress Notes (Signed)
BP (!) 143/82 (BP Location: Right Arm, Patient Position: Sitting, Cuff Size: Normal)   Pulse 75   Temp (!) 97.5 F (36.4 C) (Oral)   Wt 229 lb 8 oz (104.1 kg)   SpO2 95%   BMI 34.90 kg/m    Subjective:    Patient ID: Edward Schneider., male    DOB: 06-Apr-1956, 62 y.o.   MRN: [id number]  HPI: Edward Schneider. is a 62 y.o. male  Chief Complaint  Patient presents with  . Emesis    Patient states for the last 3 days he has vomit in his mouth when he wakes up.    Pt here today with concerns about aspiration pneumonia after waking up aspirating stomach contents overnight the past 3 nights. States he wakes up with vomit in his mouth and coming out of his nose. This has happened to him in the past but never this significantly. Having some coughing and chest tightness in the mornings, as well as worsening chronic left sided abdominal pains. Followed by GI, last EGD 2 months ago which was significant only for large amount of gastric contents/undigested food. Also had a recent chest CT which was unremarkable for any GI abnormalities.  Pt currently on lomotil, carafate, and protonix which he states doesn't seem to help. BMs are at his baseline,daily diarrhea. No fevers, melena, daytime vomiting.   Relevant past medical, surgical, family and social history reviewed and updated as indicated. Interim medical history since our last visit reviewed. Allergies and medications reviewed and updated.  Review of Systems  Per HPI unless specifically indicated above     Objective:    BP (!) 143/82 (BP Location: Right Arm, Patient Position: Sitting, Cuff Size: Normal)   Pulse 75   Temp (!) 97.5 F (36.4 C) (Oral)   Wt 229 lb 8 oz (104.1 kg)   SpO2 95%   BMI 34.90 kg/m   Wt Readings from Last 3 Encounters:  12/15/17 229 lb 8 oz (104.1 kg)  11/03/17 211 lb (95.7 kg)  09/20/17 224 lb (101.6 kg)    Physical Exam  Constitutional: He is oriented to person, place, and time. He appears well-developed  and well-nourished. He appears distressed.  HENT:  Head: Atraumatic.  Nose: Nose normal.  Mouth/Throat: Oropharynx is clear and moist. No oropharyngeal exudate.  Eyes: Conjunctivae are normal. No scleral icterus.  Neck: Normal range of motion. Neck supple.  Cardiovascular: Normal rate.  Pulmonary/Chest: Effort normal and breath sounds normal. No respiratory distress. He has no wheezes. He has no rales.  Abdominal: Bowel sounds are normal. He exhibits no distension and no mass. There is tenderness (ttp with mild guarding worse over left side of abdomen). There is no rebound.  Musculoskeletal: He exhibits no deformity.  Antalgic movements  Lymphadenopathy:    He has no cervical adenopathy.  Neurological: He is alert and oriented to person, place, and time.  Skin: Skin is warm and dry.  Psychiatric:  Dysphoric mood, very frustrated with his condition and constant pain  Nursing note and vitals reviewed.  Results for orders placed or performed in visit on 12/15/17  CBC With Differential/Platelet  Result Value Ref Range   WBC 8.9 3.4 - 10.8 x10E3/uL   RBC 4.61 4.14 - 5.80 x10E6/uL   Hemoglobin 13.8 13.0 - 17.7 g/dL   Hematocrit 40.1 37.5 - 51.0 %   MCV 87 79 - 97 fL   MCH 29.9 26.6 - 33.0 pg   MCHC 34.4 31.5 - 35.7  g/dL   RDW 14.0 12.3 - 15.4 %   Platelets 183 150 - 379 x10E3/uL   Neutrophils 54 Not Estab. %   Lymphs 32 Not Estab. %   MID 14 Not Estab. %   Neutrophils Absolute 4.7 1.4 - 7.0 x10E3/uL   Lymphocytes Absolute 2.9 0.7 - 3.1 x10E3/uL   MID (Absolute) 1.3 0.1 - 1.6 X10E3/uL  Comprehensive metabolic panel  Result Value Ref Range   Glucose 290 (H) 65 - 99 mg/dL   BUN 6 (L) 8 - 27 mg/dL   Creatinine, Ser 0.92 0.76 - 1.27 mg/dL   GFR calc non Af Amer 90 >59 mL/min/1.73   GFR calc Af Amer 104 >59 mL/min/1.73   BUN/Creatinine Ratio 7 (L) 10 - 24   Sodium 137 134 - 144 mmol/L   Potassium 4.3 3.5 - 5.2 mmol/L   Chloride 97 96 - 106 mmol/L   CO2 23 20 - 29 mmol/L    Calcium 9.6 8.6 - 10.2 mg/dL   Total Protein 7.3 6.0 - 8.5 g/dL   Albumin 4.7 3.6 - 4.8 g/dL   Globulin, Total 2.6 1.5 - 4.5 g/dL   Albumin/Globulin Ratio 1.8 1.2 - 2.2   Bilirubin Total 0.6 0.0 - 1.2 mg/dL   Alkaline Phosphatase 71 39 - 117 IU/L   AST 33 0 - 40 IU/L   ALT 43 0 - 44 IU/L      Assessment & Plan:   Problem List Items Addressed This Visit      Musculoskeletal and Integument   Skin lesions, generalized    Pt requesting second opinion, will refer to another dermatology office. Continue steroid cream prn and good moisturization      Relevant Orders   Ambulatory referral to Dermatology    Other Visit Diagnoses    Abdominal pain, unspecified abdominal location    -  Primary   Unclear why pain and aspiration are worsening. Appt coordinated with GI specialist for tomorrow for f/u, continue current regimen. CBC WNL, await CMP results.    Relevant Orders   CBC With Differential/Platelet (Completed)   Comprehensive metabolic panel (Completed)   Cough       Lungs CTAB, will get CXR to r/o aspirates or other consolidations.    Relevant Orders   DG Chest 2 View (Completed)       Follow up plan: Return for GI appt tomorrow,as scheduled with MAC subsequently.

## 2017-12-15 NOTE — Telephone Encounter (Signed)
Called in c/o waking up with vomit in his mouth 3 times this week.   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   He is being seen by Dr. Verl Blalock (GI doctor) for this also.   Had endo and colonoscopy that determined his problem.  Dr. Verl Blalock wants him to see a nutritionist to change his diet due to this problem of not digesting his food.   He hasn't been given an appt yet.     He is concerned about waking up with vomit in his mouth and getting choked on it.  I made an appt with Merrie Roof, PA-C for today at 2:45  (His wife doesn't get home from work until 2:00 so does not have a ride prior to that).       Reason for Disposition . Vomiting is a chronic symptom (recurrent or ongoing AND present > 4 weeks)  Answer Assessment - Initial Assessment Questions 1. VOMITING SEVERITY: "How many times have you vomited in the past 24 hours?"     - MILD:  1 - 2 times/day    - MODERATE: 3 - 5 times/day, decreased oral intake without significant weight loss or symptoms of dehydration    - SEVERE: 6 or more times/day, vomits everything or nearly everything, with significant weight loss, symptoms of dehydration      I just vomited last night in my sleep.   I've vomited 3 times this week in my sleep.   Yesterday I felt like I was going to throw up but I didn't.   2. ONSET: "When did the vomiting begin?"      This past week has been the worst.    I have vomit coming out of my nose and mouth.   I wake up with it in my mouth.   I feel like I'm choking. 3. FLUIDS: "What fluids or food have you vomited up today?" "Have you been able to keep any fluids down?"     Yes.   4:30am I vomited this morning. 4. ABDOMINAL PAIN: "Are your having any abdominal pain?" If yes : "How bad is it and what does it feel like?" (e.g., crampy, dull, intermittent, constant)      I have abd pain every day where my body does not digest food.   I've had an endoscopy and colonoscopy and the food is not digesting.    Dr. Verl Blalock told me I need to see a nutritionist to change my diet.   I've not seen her yet. 5. DIARRHEA: "Is there any diarrhea?" If so, ask: "How many times today?"      I have diarrhea every day.   That's normal for me. 6. CONTACTS: "Is there anyone else in the family with the same symptoms?"      No 7. CAUSE: "What do you think is causing your vomiting?"     My body won't digest my food.   This has been going on for 4 years. 8. HYDRATION STATUS: "Any signs of dehydration?" (e.g., dry mouth [not only dry lips], too weak to stand) "When did you last urinate?"     I drink every day.   I'm urinating ok.   I may not do #2 for 3 days sometimes. 9. OTHER SYMPTOMS: "Do you have any other symptoms?" (e.g., fever, headache, vertigo, vomiting blood or coffee grounds, recent head injury)     None of the above. 10. PREGNANCY: "Is there any  chance you are pregnant?" "When was your last menstrual period?"       N/A  Protocols used: [facility]

## 2017-12-16 ENCOUNTER — Ambulatory Visit (INDEPENDENT_AMBULATORY_CARE_PROVIDER_SITE_OTHER): Payer: Medicare Other | Admitting: Gastroenterology

## 2017-12-16 ENCOUNTER — Encounter: Payer: Self-pay | Admitting: Gastroenterology

## 2017-12-16 ENCOUNTER — Ambulatory Visit
Admission: RE | Admit: 2017-12-16 | Discharge: 2017-12-16 | Disposition: A | Discharge status: Home / Self Care | Payer: Medicare Other | Source: Ambulatory Visit | Attending: Family Medicine | Admitting: Family Medicine

## 2017-12-16 VITALS — BP 138/88 | HR 85 | Ht 68.0 in | Wt 231.0 lb

## 2017-12-16 DIAGNOSIS — R05 Cough: Secondary | ICD-10-CM

## 2017-12-16 DIAGNOSIS — R0781 Pleurodynia: Secondary | ICD-10-CM | POA: Diagnosis not present

## 2017-12-16 DIAGNOSIS — R059 Cough, unspecified: Secondary | ICD-10-CM

## 2017-12-16 DIAGNOSIS — R079 Chest pain, unspecified: Secondary | ICD-10-CM | POA: Diagnosis not present

## 2017-12-16 DIAGNOSIS — R1030 Lower abdominal pain, unspecified: Secondary | ICD-10-CM | POA: Diagnosis not present

## 2017-12-16 LAB — COMPREHENSIVE METABOLIC PANEL
ALBUMIN: 4.7 g/dL (ref 3.6–4.8)
ALK PHOS: 71 IU/L (ref 39–117)
ALT: 43 IU/L (ref 0–44)
AST: 33 IU/L (ref 0–40)
Albumin/Globulin Ratio: 1.8 (ref 1.2–2.2)
BUN / CREAT RATIO: 7 — AB (ref 10–24)
BUN: 6 mg/dL — AB (ref 8–27)
Bilirubin Total: 0.6 mg/dL (ref 0.0–1.2)
CALCIUM: 9.6 mg/dL (ref 8.6–10.2)
CO2: 23 mmol/L (ref 20–29)
CREATININE: 0.92 mg/dL (ref 0.76–1.27)
Chloride: 97 mmol/L (ref 96–106)
GFR calc Af Amer: 104 mL/min/{1.73_m2} (ref >59)
GFR calc non Af Amer: 90 mL/min/{1.73_m2} (ref >59)
GLUCOSE: 290 mg/dL — AB (ref 65–99)
Globulin, Total: 2.6 g/dL (ref 1.5–4.5)
Potassium: 4.3 mmol/L (ref 3.5–5.2)
Sodium: 137 mmol/L (ref 134–144)
TOTAL PROTEIN: 7.3 g/dL (ref 6.0–8.5)

## 2017-12-16 MED ORDER — CYCLOBENZAPRINE HCL 10 MG PO TABS: 10.0000 mg | ORAL_TABLET | Freq: Three times a day (TID) | ORAL | 1 refills | Status: DC | PRN

## 2017-12-16 NOTE — Patient Instructions (Signed)
You are scheduled for a CT abdomen/pelvis at [facility], [place], Wednesday, March 6th @ 10:30am. Please arrive at the registration desk at 10:15am. You will need to pick up oral contrast.   If you need to reschedule this appointment for any reason, please contact central scheduling at [phone].
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
## 2017-12-16 NOTE — Assessment & Plan Note (Signed)
Pt requesting second opinion, will refer to another dermatology office. Continue steroid cream prn and good moisturization

## 2017-12-22 ENCOUNTER — Ambulatory Visit: Payer: Medicare Other

## 2017-12-23 ENCOUNTER — Telehealth: Payer: Self-pay | Admitting: Gastroenterology

## 2017-12-23 NOTE — Telephone Encounter (Signed)
Pt states  Dr. Allen Norris instructed pt to spop taking his rx for his stomach he has water coming out of him he is weak and shaking he has accidents and caint do nothing please call pt he is very concerned

## 2017-12-23 NOTE — Telephone Encounter (Signed)
Pt was advised that Dr. Allen Norris did not instruct him to stop taking his anti diarrheal or protonix. He was advised to restart all of these medications and to drink lots of fluids due to his weakness and shaking. He was advised if he continued to have these symptoms, he is to go to the ER.

## 2017-12-27 DIAGNOSIS — G629 Polyneuropathy, unspecified: Secondary | ICD-10-CM | POA: Diagnosis not present

## 2017-12-27 DIAGNOSIS — M179 Osteoarthritis of knee, unspecified: Secondary | ICD-10-CM | POA: Diagnosis not present

## 2017-12-27 DIAGNOSIS — Z5181 Encounter for therapeutic drug level monitoring: Secondary | ICD-10-CM | POA: Diagnosis not present

## 2017-12-27 DIAGNOSIS — E11610 Type 2 diabetes mellitus with diabetic neuropathic arthropathy: Secondary | ICD-10-CM | POA: Diagnosis not present

## 2017-12-29 ENCOUNTER — Ambulatory Visit: Admission: RE | Admit: 2017-12-29 | Payer: Medicare Other | Source: Ambulatory Visit

## 2017-12-29 ENCOUNTER — Ambulatory Visit
Admission: RE | Admit: 2017-12-29 | Discharge: 2017-12-29 | Disposition: A | Discharge status: Home / Self Care | Payer: Medicare Other | Source: Ambulatory Visit | Attending: Gastroenterology | Admitting: Gastroenterology

## 2017-12-29 DIAGNOSIS — R1030 Lower abdominal pain, unspecified: Secondary | ICD-10-CM

## 2017-12-29 DIAGNOSIS — K760 Fatty (change of) liver, not elsewhere classified: Secondary | ICD-10-CM | POA: Insufficient documentation

## 2017-12-29 DIAGNOSIS — R109 Unspecified abdominal pain: Secondary | ICD-10-CM | POA: Diagnosis not present

## 2017-12-29 MED ORDER — IOPAMIDOL (ISOVUE-300) INJECTION 61%
100.0000 mL | Freq: Once | INTRAVENOUS | Status: AC | PRN
  Administered 2017-12-29: 100 mL via INTRAVENOUS

## 2017-12-30 DIAGNOSIS — L281 Prurigo nodularis: Secondary | ICD-10-CM | POA: Diagnosis not present

## 2018-01-04 ENCOUNTER — Telehealth: Payer: Self-pay

## 2018-01-04 NOTE — Telephone Encounter (Signed)
LVM for pt to return my call.

## 2018-01-04 NOTE — Telephone Encounter (Signed)
-----   Message from Lucilla Lame, MD sent at 12/31/2017  2:23 PM EDT ----- Let the patient know that the CT scan of the abdomen showed a stable fatty liver and small benign cysts.  There is no source of his abdominal pain seen on the CT scan.

## 2018-01-06 NOTE — Telephone Encounter (Signed)
Pt returned call and was notified of CT scan results.   Pt stated he is still having abdominal pain. The flexeril was helping his discomfort. Pt will run out this Friday. Pt is requesting a refill. Please advise.

## 2018-01-07 ENCOUNTER — Other Ambulatory Visit: Payer: Self-pay

## 2018-01-07 MED ORDER — CYCLOBENZAPRINE HCL 10 MG PO TABS: 10.0000 mg | ORAL_TABLET | Freq: Three times a day (TID) | ORAL | 1 refills | Status: AC | PRN

## 2018-01-07 NOTE — Telephone Encounter (Signed)
Pt notified Dr. Allen Norris has ok'd me to refill his Flexeril.

## 2018-01-07 NOTE — Telephone Encounter (Signed)
Please refill the flexeril.

## 2018-01-24 DIAGNOSIS — Z5181 Encounter for therapeutic drug level monitoring: Secondary | ICD-10-CM | POA: Diagnosis not present

## 2018-01-24 DIAGNOSIS — E11610 Type 2 diabetes mellitus with diabetic neuropathic arthropathy: Secondary | ICD-10-CM | POA: Diagnosis not present

## 2018-01-24 DIAGNOSIS — G629 Polyneuropathy, unspecified: Secondary | ICD-10-CM | POA: Diagnosis not present

## 2018-01-24 DIAGNOSIS — M179 Osteoarthritis of knee, unspecified: Secondary | ICD-10-CM | POA: Diagnosis not present

## 2018-02-01 DIAGNOSIS — L281 Prurigo nodularis: Secondary | ICD-10-CM | POA: Diagnosis not present

## 2018-02-01 DIAGNOSIS — L638 Other alopecia areata: Secondary | ICD-10-CM | POA: Diagnosis not present

## 2018-02-08 ENCOUNTER — Ambulatory Visit (INDEPENDENT_AMBULATORY_CARE_PROVIDER_SITE_OTHER): Payer: Medicare Other | Admitting: Family Medicine

## 2018-02-08 ENCOUNTER — Encounter: Payer: Self-pay | Admitting: Family Medicine

## 2018-02-08 VITALS — BP 152/85 | HR 75 | Wt 229.0 lb

## 2018-02-08 DIAGNOSIS — E1142 Type 2 diabetes mellitus with diabetic polyneuropathy: Secondary | ICD-10-CM | POA: Diagnosis not present

## 2018-02-08 DIAGNOSIS — G4730 Sleep apnea, unspecified: Secondary | ICD-10-CM | POA: Diagnosis not present

## 2018-02-08 DIAGNOSIS — M25561 Pain in right knee: Secondary | ICD-10-CM | POA: Diagnosis not present

## 2018-02-08 DIAGNOSIS — M5136 Other intervertebral disc degeneration, lumbar region: Secondary | ICD-10-CM

## 2018-02-08 NOTE — Assessment & Plan Note (Signed)
Discussed diabetes poor control patient having great deal of cost control issues unable to get Jardiance due to cost over $200.  Gave patient written instructions to ask about the cost about farxiga and Invokana Will assess after patient gives me information.  May need other medication.  Again discussed better diet nutrition exercise

## 2018-02-08 NOTE — Assessment & Plan Note (Signed)
Patient will follow-up for chronic pain through pain center

## 2018-02-08 NOTE — Assessment & Plan Note (Signed)
Marked sleep apnea symptoms see worksheet for details Epworth sleeping score of 15 Will refer for sleep study

## 2018-02-08 NOTE — Progress Notes (Addendum)
BP (!) 152/85   Pulse 75   Wt 229 lb (103.9 kg)   SpO2 98%   BMI 34.82 kg/m    Subjective:    Patient ID: Edward Flesher., male    DOB: February 07, 1956, 62 y.o.   MRN: [id number]  HPI: Edward Vicario. is a 62 y.o. male  Chief Complaint  Patient presents with  . Follow-up  She was complaints of right knee hyperextending and swelling is been ongoing for the last 2 weeks. Started after an episode of slipping and knee hyperextending.  Was continued to be very painful.  Wearing a neoprene sleeve over his knee currently seems to help a little bit.  Chronic pain managed by pain center patient still having a great deal of issues.  Diabetes have been a struggle not taken far [employer] because of cost also is having some weight issues.  Blood sugars been going up.  Patient with marked sleep apnea symptoms of daytime drowsiness marked snoring observed apnea spells.   Relevant past medical, surgical, family and social history reviewed and updated as indicated. Interim medical history since our last visit reviewed. Allergies and medications reviewed and updated.  Review of Systems  Constitutional: Negative.   Respiratory: Negative.   Cardiovascular: Negative.     Per HPI unless specifically indicated above     Objective:    BP (!) 152/85   Pulse 75   Wt 229 lb (103.9 kg)   SpO2 98%   BMI 34.82 kg/m   Wt Readings from Last 3 Encounters:  02/08/18 229 lb (103.9 kg)  12/16/17 231 lb (104.8 kg)  12/15/17 229 lb 8 oz (104.1 kg)    Physical Exam  Constitutional: He is oriented to person, place, and time. He appears well-developed and well-nourished.  HENT:  Head: Normocephalic and atraumatic.  Eyes: Conjunctivae and EOM are normal.  Neck: Normal range of motion.  Cardiovascular: Normal rate, regular rhythm and normal heart sounds.  Pulmonary/Chest: Effort normal and breath sounds normal.  Musculoskeletal: Normal range of motion.  Neurological: He is alert and oriented to person,  place, and time.  Skin: No erythema.  Psychiatric: He has a normal mood and affect. His behavior is normal. Judgment and thought content normal.    Results for orders placed or performed in visit on 12/15/17  CBC With Differential/Platelet  Result Value Ref Range   WBC 8.9 3.4 - 10.8 x10E3/uL   RBC 4.61 4.14 - 5.80 x10E6/uL   Hemoglobin 13.8 13.0 - 17.7 g/dL   Hematocrit 40.1 37.5 - 51.0 %   MCV 87 79 - 97 fL   MCH 29.9 26.6 - 33.0 pg   MCHC 34.4 31.5 - 35.7 g/dL   RDW 14.0 12.3 - 15.4 %   Platelets 183 150 - 379 x10E3/uL   Neutrophils 54 Not Estab. %   Lymphs 32 Not Estab. %   MID 14 Not Estab. %   Neutrophils Absolute 4.7 1.4 - 7.0 x10E3/uL   Lymphocytes Absolute 2.9 0.7 - 3.1 x10E3/uL   MID (Absolute) 1.3 0.1 - 1.6 X10E3/uL  Comprehensive metabolic panel  Result Value Ref Range   Glucose 290 (H) 65 - 99 mg/dL   BUN 6 (L) 8 - 27 mg/dL   Creatinine, Ser 0.92 0.76 - 1.27 mg/dL   GFR calc non Af Amer 90 >59 mL/min/1.73   GFR calc Af Amer 104 >59 mL/min/1.73   BUN/Creatinine Ratio 7 (L) 10 - 24   Sodium 137 134 - 144 mmol/L  Potassium 4.3 3.5 - 5.2 mmol/L   Chloride 97 96 - 106 mmol/L   CO2 23 20 - 29 mmol/L   Calcium 9.6 8.6 - 10.2 mg/dL   Total Protein 7.3 6.0 - 8.5 g/dL   Albumin 4.7 3.6 - 4.8 g/dL   Globulin, Total 2.6 1.5 - 4.5 g/dL   Albumin/Globulin Ratio 1.8 1.2 - 2.2   Bilirubin Total 0.6 0.0 - 1.2 mg/dL   Alkaline Phosphatase 71 39 - 117 IU/L   AST 33 0 - 40 IU/L   ALT 43 0 - 44 IU/L      Assessment & Plan:   Problem List Items Addressed This Visit      Respiratory   Sleep apnea    Marked sleep apnea symptoms see worksheet for details Epworth sleeping score of 15 Will refer for sleep study        Endocrine   DM type 2 with diabetic peripheral neuropathy ([place]) - Primary    Discussed diabetes poor control patient having great deal of cost control issues unable to get Jardiance due to cost over $200.  Gave patient written instructions to ask about the  cost about farxiga and Invokana Will assess after patient gives me information.  May need other medication.  Again discussed better diet nutrition exercise      Relevant Orders   Bayer DCA Hb A1c Waived     Musculoskeletal and Integument   DDD (degenerative disc disease), lumbar    Patient will follow-up for chronic pain through pain center        Other   Knee pain, right    For patient's knee will refer to orthopedics      Relevant Orders   Ambulatory referral to Orthopedic Surgery       Follow up plan: Return in about 3 months (around 05/10/2018) for Hemoglobin A1c, Physical Exam.

## 2018-02-08 NOTE — Assessment & Plan Note (Signed)
For patient's knee will refer to orthopedics

## 2018-02-09 LAB — BAYER DCA HB A1C WAIVED: HB A1C (BAYER DCA - WAIVED): 8.6 % — ABNORMAL HIGH (ref <7.0)

## 2018-02-21 DIAGNOSIS — E11610 Type 2 diabetes mellitus with diabetic neuropathic arthropathy: Secondary | ICD-10-CM | POA: Diagnosis not present

## 2018-02-21 DIAGNOSIS — M179 Osteoarthritis of knee, unspecified: Secondary | ICD-10-CM | POA: Diagnosis not present

## 2018-02-21 DIAGNOSIS — G629 Polyneuropathy, unspecified: Secondary | ICD-10-CM | POA: Diagnosis not present

## 2018-02-21 DIAGNOSIS — Z5181 Encounter for therapeutic drug level monitoring: Secondary | ICD-10-CM | POA: Diagnosis not present

## 2018-02-23 ENCOUNTER — Telehealth: Payer: Self-pay | Admitting: Family Medicine

## 2018-02-23 DIAGNOSIS — R5383 Other fatigue: Secondary | ICD-10-CM

## 2018-02-23 NOTE — Telephone Encounter (Signed)
Can you please check into this?

## 2018-02-23 NOTE — Telephone Encounter (Signed)
Copied from [place] [id number]. Topic: Referral - Status >> Feb 23, 2018 11:32 AM Rutherford Nail, NT wrote: Reason for CRM: Patient calling and states that he has not received any calls to schedule anything with Orthopedic for his knee or Sleep apnea study. Please advise. CB#: [phone]

## 2018-02-23 NOTE — Telephone Encounter (Signed)
Could a referral be put in for sleep medicine.  There is no referral for a sleep study.  [facility] has called patient to call and [place]/d appt.

## 2018-03-02 DIAGNOSIS — G4730 Sleep apnea, unspecified: Secondary | ICD-10-CM | POA: Diagnosis not present

## 2018-03-16 ENCOUNTER — Ambulatory Visit: Payer: Medicare Other

## 2018-03-16 DIAGNOSIS — G4733 Obstructive sleep apnea (adult) (pediatric): Secondary | ICD-10-CM | POA: Diagnosis not present

## 2018-03-21 DIAGNOSIS — M179 Osteoarthritis of knee, unspecified: Secondary | ICD-10-CM | POA: Diagnosis not present

## 2018-03-21 DIAGNOSIS — E11610 Type 2 diabetes mellitus with diabetic neuropathic arthropathy: Secondary | ICD-10-CM | POA: Diagnosis not present

## 2018-03-21 DIAGNOSIS — G629 Polyneuropathy, unspecified: Secondary | ICD-10-CM | POA: Diagnosis not present

## 2018-03-21 DIAGNOSIS — Z5181 Encounter for therapeutic drug level monitoring: Secondary | ICD-10-CM | POA: Diagnosis not present

## 2018-03-23 DIAGNOSIS — L281 Prurigo nodularis: Secondary | ICD-10-CM | POA: Diagnosis not present

## 2018-03-25 DIAGNOSIS — G4733 Obstructive sleep apnea (adult) (pediatric): Secondary | ICD-10-CM | POA: Diagnosis not present

## 2018-04-18 DIAGNOSIS — M179 Osteoarthritis of knee, unspecified: Secondary | ICD-10-CM | POA: Diagnosis not present

## 2018-04-18 DIAGNOSIS — Z5181 Encounter for therapeutic drug level monitoring: Secondary | ICD-10-CM | POA: Diagnosis not present

## 2018-04-18 DIAGNOSIS — G629 Polyneuropathy, unspecified: Secondary | ICD-10-CM | POA: Diagnosis not present

## 2018-04-18 DIAGNOSIS — E11610 Type 2 diabetes mellitus with diabetic neuropathic arthropathy: Secondary | ICD-10-CM | POA: Diagnosis not present

## 2018-04-25 ENCOUNTER — Encounter: Payer: Self-pay | Admitting: Family Medicine

## 2018-04-25 ENCOUNTER — Ambulatory Visit (INDEPENDENT_AMBULATORY_CARE_PROVIDER_SITE_OTHER): Payer: Medicare Other

## 2018-04-25 ENCOUNTER — Ambulatory Visit: Payer: Medicare Other

## 2018-04-25 VITALS — BP 118/80 | HR 85 | Temp 97.8°F | Resp 16 | Ht 68.0 in | Wt 221.0 lb

## 2018-04-25 DIAGNOSIS — S41112A Laceration without foreign body of left upper arm, initial encounter: Secondary | ICD-10-CM

## 2018-04-25 DIAGNOSIS — Z0000 Encounter for general adult medical examination without abnormal findings: Secondary | ICD-10-CM | POA: Diagnosis not present

## 2018-04-25 DIAGNOSIS — Z23 Encounter for immunization: Secondary | ICD-10-CM | POA: Diagnosis not present

## 2018-04-25 NOTE — Progress Notes (Addendum)
Subjective:   Edward Schneider. is a 62 y.o. male who presents for Medicare Annual/Subsequent preventive examination.  Review of Systems:   Cardiac Risk Factors include: advanced age (>44men, >96 women);dyslipidemia;diabetes mellitus;hypertension;obesity (BMI >30kg/m2);smoking/ tobacco exposure;male gender     Objective:    Vitals: BP 118/80 (BP Location: Left Arm, Patient Position: Sitting)   Pulse 85   Temp 97.8 F (36.6 C) (Temporal)   Resp 16   Ht 5\' 8"  (1.727 m)   Wt 221 lb (100.2 kg)   SpO2 98%   BMI 33.60 kg/m   Body mass index is 33.6 kg/m.  Advanced Directives 04/25/2018 09/20/2017 04/04/2017 04/03/2017 03/12/2017 10/23/2016 06/15/2016  Does Patient Have a Medical Advance Directive? No No No No No No No  Would patient like information on creating a medical advance directive? Yes (MAU/Ambulatory/Procedural Areas - Information given) No - Patient declined No - Patient declined No - Patient declined Yes (MAU/Ambulatory/Procedural Areas - Information given) No - Patient declined No - patient declined information    Tobacco Social History   Tobacco Use  Smoking Status Former Smoker  . Packs/day: 2.00  . Years: 50.00  . Pack years: 100.00  . Types: Cigarettes  . Last attempt to quit: 07/04/2012  . Years since quitting: 5.8  Smokeless Tobacco Never Used     Counseling given: Not Answered   Clinical Intake:  Pre-visit preparation completed: Yes  Pain : 0-10 Pain Score: 8  Pain Type: Acute pain Pain Location: Generalized Pain Descriptors / Indicators: Aching Pain Onset: More than a month ago Pain Frequency: Constant     Nutritional Status: BMI > 30  Obese Nutritional Risks: Nausea/ vomitting/ diarrhea Diabetes: Yes  How often do you need to have someone help you when you read instructions, pamphlets, or other written materials from your doctor or pharmacy?: 1 - Never What is the last grade level you completed in school?: 2 years college   Interpreter Needed?:  No  Information entered by :: Tyler Aas, LPN   Past Medical History:  Diagnosis Date  . Allergy   . Asthma   . C. difficile diarrhea   . Cancer [facility]) June 2016   liver cancer  . Chronic pain   . DDD (degenerative disc disease), cervical   . DDD (degenerative disc disease), lumbar   . Diabetes mellitus without complication ([place])   . GERD (gastroesophageal reflux disease)   . Headache    migraines - none since 02/17  . Hypertension   . Low blood sugar   . Myocardial infarction ([place])   . Seizures ([place])    several as child when sick.  None since age 59  . Stroke [facility])    'mini-stroke" 30 yrs ago. no deficits.  . Wears dentures    full upper and lower   Past Surgical History:  Procedure Laterality Date  . APPENDECTOMY    . BACK SURGERY    . CARDIAC CATHETERIZATION     stent placed in his "30's"  . COLONOSCOPY WITH PROPOFOL N/A 03/06/2016   Procedure: COLONOSCOPY WITH PROPOFOL;  Surgeon: Lucilla Lame, MD;  Location: [place];  Service: Endoscopy;  Laterality: N/A;  requests early  . ESOPHAGOGASTRODUODENOSCOPY (EGD) WITH PROPOFOL N/A 09/20/2017   Procedure: ESOPHAGOGASTRODUODENOSCOPY (EGD) WITH PROPOFOL;  Surgeon: Lucilla Lame, MD;  Location: [place];  Service: Endoscopy;  Laterality: N/A;  Diabetic - oral meds  . FINGER SURGERY Left   . KNEE SURGERY Right   . NECK SURGERY    .  spleen surg    . spleen surgery    . TOE SURGERY Right    Family History  Problem Relation Age of Onset  . Arthritis Mother   . Diabetes Mother   . Kidney disease Mother   . Heart disease Mother   . Hypertension Mother   . Arthritis Father   . Hearing loss Father   . Hypertension Father   . Diabetes Sister   . Heart disease Sister   . Diabetes Daughter   . Diabetes Maternal Aunt   . Diabetes Maternal Grandmother    Social History   Socioeconomic History  . Marital status: Married    Spouse name: Not on file  . Number of children: Not on file  . Years of  education: Not on file  . Highest education level: Not on file  Occupational History  . Not on file  Social Needs  . Financial resource strain: Not hard at all  . Food insecurity:    Worry: Never true    Inability: Never true  . Transportation needs:    Medical: No    Non-medical: No  Tobacco Use  . Smoking status: Former Smoker    Packs/day: 2.00    Years: 50.00    Pack years: 100.00    Types: Cigarettes    Last attempt to quit: 07/04/2012    Years since quitting: 5.8  . Smokeless tobacco: Never Used  Substance and Sexual Activity  . Alcohol use: No    Alcohol/week: 0.0 oz  . Drug use: No  . Sexual activity: Not on file  Lifestyle  . Physical activity:    Days per week: 0 days    Minutes per session: 0 min  . Stress: Not at all  Relationships  . Social connections:    Talks on phone: Once a week    Gets together: Once a week    Attends religious service: More than 4 times per year    Active member of club or organization: No    Attends meetings of clubs or organizations: Never    Relationship status: Married  Other Topics Concern  . Not on file  Social History Narrative  . Not on file    Outpatient Encounter Medications as of 04/25/2018  Medication Sig  . ACCU-CHEK AVIVA PLUS test strip USE TEST STRIP(S) TO CHECK GLUCOSE ONCE DAILY  . ACCU-CHEK SOFTCLIX LANCETS lancets USE TO CHECK GLUCOSE ONCE DAILY  . aspirin EC 81 MG tablet Take 162 mg by mouth daily.   Marland Kitchen atorvastatin (LIPITOR) 20 MG tablet Take 1 tablet (20 mg total) by mouth daily.  . cyclobenzaprine (FLEXERIL) 10 MG tablet Take 1 tablet (10 mg total) by mouth 3 (three) times daily as needed for muscle spasms.  . diphenhydrAMINE (BENADRYL) 25 mg capsule Take 25 mg by mouth 2 (two) times daily.   . diphenoxylate-atropine (LOMOTIL) 2.5-0.025 MG tablet Take 1 tablet by mouth 4 (four) times daily as needed for diarrhea or loose stools.  . DULoxetine (CYMBALTA) 60 MG capsule Take 1 capsule (60 mg total) by mouth  daily.  . Ginseng 100 MG CAPS Take by mouth.  Marland Kitchen JARDIANCE 25 MG TABS tablet Take 25 mg by mouth daily.  . metFORMIN (GLUCOPHAGE) 500 MG tablet Take 2 tablets (1,000 mg total) by mouth 2 (two) times daily with a meal.  . Multiple Vitamin (MULTIVITAMIN) capsule Take 1 capsule by mouth daily.  Marland Kitchen oxyCODONE (ROXICODONE) 5 MG immediate release tablet Limit one tablet by mouth 3-6  times per day if tolerated (Patient taking differently: Limit one tablet by mouth 3-6  times per day if tolerated, pt states he is taking 6 tablets daily)  . pantoprazole (PROTONIX) 40 MG tablet Take 1 tablet (40 mg total) by mouth 2 (two) times daily.  . pregabalin (LYRICA) 50 MG capsule Take 1 capsule (50 mg total) by mouth 3 (three) times daily.  . sucralfate (CARAFATE) 1 g tablet Take 1 tablet (1 g total) by mouth 4 (four) times daily -  with meals and at bedtime.  . traZODone (DESYREL) 100 MG tablet Take 1 tablet (100 mg total) by mouth at bedtime as needed for sleep.  Marland Kitchen triamcinolone cream (KENALOG) 0.1 % Apply 1 application topically 2 (two) times daily.  . vitamin B-12 (CYANOCOBALAMIN) 1000 MCG tablet Take 1,000 mcg by mouth daily.  . Ibuprofen (ADVIL MIGRAINE) 200 MG CAPS Take by mouth.  . Omega-3 1000 MG CAPS Take by mouth.   No facility-administered encounter medications on file as of 04/25/2018.     Activities of Daily Living In your present state of health, do you have any difficulty performing the following activities: 04/25/2018 09/20/2017  Hearing? Y N  Vision? Y N  Difficulty concentrating or making decisions? N N  Walking or climbing stairs? Y N  Comment dizziness -  Dressing or bathing? N N  Doing errands, shopping? N -  Preparing Food and eating ? N -  Using the Toilet? N -  In the past six months, have you accidently leaked urine? N -  Do you have problems with loss of bowel control? N -  Managing your Medications? N -  Managing your Finances? N -  Housekeeping or managing your Housekeeping? N -    Some recent data might be hidden    Patient Care Team: Guadalupe Maple, MD as PCP - General (Family Medicine)   Assessment:   This is a routine wellness examination for Reza.  Exercise Activities and Dietary recommendations Current Exercise Habits: The patient does not participate in regular exercise at present, Exercise limited by: None identified  Goals    . DIET - INCREASE WATER INTAKE     Recommend drinking at least 6-8 glasses of water a day        Fall Risk Fall Risk  04/25/2018 02/08/2018 11/03/2017 03/29/2017 03/12/2017  Falls in the past year? Yes No No Yes Yes  Comment - - - - -  Number falls in past yr: 2 or more - - 1 -  Comment - - - - -  Injury with Fall? No - - No No  Comment - - - - -  Risk Factor Category  High Fall Risk - - - -  Risk for fall due to : - - - - -  Risk for fall due to: Comment - - - - -  Follow up Falls evaluation completed;Falls prevention discussed - - - -   Is the patient's home free of loose throw rugs in walkways, pet beds, electrical cords, etc?   no      Grab bars in the bathroom? no      Handrails on the stairs?   no      Adequate lighting?   yes  Timed Get Up and Go Performed: Completed in 8 seconds with no use of assistive devices, steady gait. No intervention needed at this time.   Depression Screen PHQ 2/9 Scores 04/25/2018 11/03/2017 03/12/2017 03/12/2017  PHQ - 2 Score 6 0 2  0  PHQ- 9 Score 16 - 7 -  Exception Documentation - - - -    Cognitive Function     6CIT Screen 04/25/2018  What Year? 0 points  What month? 0 points  What time? 0 points  Count back from 20 0 points  Months in reverse 0 points  Repeat phrase 0 points  Total Score 0    Immunization History  Administered Date(s) Administered  . Influenza,inj,Quad PF,6+ Mos 08/21/2015, 09/24/2016, 07/12/2017  . Influenza-Unspecified 07/19/2014  . Pneumococcal Polysaccharide-23 06/07/2012  . Td 10/28/2007  . Tdap 04/25/2018    Qualifies for Shingles Vaccine?   Yes, discussed shingrix vaccine   Screening Tests Health Maintenance  Topic Date Due  . OPHTHALMOLOGY EXAM  03/29/1967  . FOOT EXAM  01/05/2018  . URINE MICROALBUMIN  01/05/2018  . PNEUMOCOCCAL POLYSACCHARIDE VACCINE (2) 03/28/2022 (Originally 06/07/2017)  . INFLUENZA VACCINE  05/19/2018  . HEMOGLOBIN A1C  08/10/2018  . COLONOSCOPY  03/06/2026  . TETANUS/TDAP  04/25/2028  . HIV Screening  Completed  . Hepatitis C Screening  Addressed   Cancer Screenings: Lung: Low Dose CT Chest recommended if Age 87-80 years, 30 pack-year currently smoking OR have quit w/in 15years. Patient does qualify. Due 09/2018 Colorectal: completed 03/06/2016  Additional Screenings:  Hepatitis C Screening: completed 01/24/2015      Plan:    I have personally reviewed and addressed the Medicare Annual Wellness questionnaire and have noted the following in the patient's chart:  A. Medical and social history B. Use of alcohol, tobacco or illicit drugs  C. Current medications and supplements D. Functional ability and status E.  Nutritional status F.  Physical activity G. Advance directives H. List of other physicians I.  Hospitalizations, surgeries, and ER visits in previous 12 months J.  [place] such as hearing and vision if needed, cognitive and depression L. Referrals and appointments   In addition, I have reviewed and discussed with patient certain preventive protocols, quality metrics, and best practice recommendations. A written personalized care plan for preventive services as well as general preventive health recommendations were provided to patient.   Signed,  Tyler Aas, LPN Nurse Health Advisor   Nurse Notes: weak and dizzy in the last week- with some occasional nausea and diarrhea-  had 2 tics he pulled off him prior to symptoms starting, one spot is still sore and itching.   6cit score- 16 (has appt on 04/27/2018 with Dr.Crissman)

## 2018-04-25 NOTE — Patient Instructions (Signed)
[name] , Thank you for taking time to come for your Medicare Wellness Visit. I appreciate your ongoing commitment to your health goals. Please review the following plan we discussed and let me know if I can assist you in the future.   Screening recommendations/referrals: Colonoscopy: completed 03/06/2016 Recommended yearly ophthalmology/optometry visit for glaucoma screening and checkup Recommended yearly dental visit for hygiene and checkup  Vaccinations: Influenza vaccine: due 06/2018 Pneumococcal vaccine: due at age 64 Tdap vaccine: up to date Shingles vaccine: shingrix eligible, check with your insurance company for coverage   Advanced directives: Advance directive discussed with you today. I have provided a copy for you to complete at home and have notarized. Once this is complete please bring a copy in to our office so we can scan it into your chart.  Conditions/risks identified: Recommend drinking at least 6-8 glasses of water a day   Next appointment: Follow up on 04/27/2018 at 3:00pm with Dr.Crissman. Follow up in one year for your annual wellness exam.   Preventive Care 40-64 Years, Male Preventive care refers to lifestyle choices and visits with your health care provider that can promote health and wellness. What does preventive care include?  A yearly physical exam. This is also called an annual well check.  Dental exams once or twice a year.  Routine eye exams. Ask your health care provider how often you should have your eyes checked.  Personal lifestyle choices, including:  Daily care of your teeth and gums.  Regular physical activity.  Eating a healthy diet.  Avoiding tobacco and drug use.  Limiting alcohol use.  Practicing safe sex.  Taking low-dose aspirin every day starting at age 45. What happens during an annual well check? The services and screenings done by your health care provider during your annual well check will depend on your age, overall  health, lifestyle risk factors, and family history of disease. Counseling  Your health care provider may ask you questions about your:  Alcohol use.  Tobacco use.  Drug use.  Emotional well-being.  Home and relationship well-being.  Sexual activity.  Eating habits.  Work and work Statistician. Screening  You may have the following tests or measurements:  Height, weight, and BMI.  Blood pressure.  Lipid and cholesterol levels. These may be checked every 5 years, or more frequently if you are over 29 years old.  Skin check.  Lung cancer screening. You may have this screening every year starting at age 4 if you have a 30-pack-year history of smoking and currently smoke or have quit within the past 15 years.  Fecal occult blood test (FOBT) of the stool. You may have this test every year starting at age 70.  Flexible sigmoidoscopy or colonoscopy. You may have a sigmoidoscopy every 5 years or a colonoscopy every 10 years starting at age 17.  Prostate cancer screening. Recommendations will vary depending on your family history and other risks.  Hepatitis C blood test.  Hepatitis B blood test.  Sexually transmitted disease (STD) testing.  Diabetes screening. This is done by checking your blood sugar (glucose) after you have not eaten for a while (fasting). You may have this done every 1-3 years. Discuss your test results, treatment options, and if necessary, the need for more tests with your health care provider. Vaccines  Your health care provider may recommend certain vaccines, such as:  Influenza vaccine. This is recommended every year.  Tetanus, diphtheria, and acellular pertussis (Tdap, Td) vaccine. You may need a Td  booster every 10 years.  Zoster vaccine. You may need this after age 15.  Pneumococcal 13-valent conjugate (PCV13) vaccine. You may need this if you have certain conditions and have not been vaccinated.  Pneumococcal polysaccharide (PPSV23) vaccine.  You may need one or two doses if you smoke cigarettes or if you have certain conditions. Talk to your health care provider about which screenings and vaccines you need and how often you need them. This information is not intended to replace advice given to you by your health care provider. Make sure you discuss any questions you have with your health care provider. Document Released: 11/01/2015 Document Revised: 06/24/2016 Document Reviewed: 08/06/2015 Elsevier Interactive Patient Education  2017 [place] Prevention in the Home Falls can cause injuries. They can happen to people of all ages. There are many things you can do to make your home safe and to help prevent falls. What can I do on the outside of my home?  Regularly fix the edges of walkways and driveways and fix any cracks.  Remove anything that might make you trip as you walk through a door, such as a raised step or threshold.  Trim any bushes or trees on the path to your home.  Use bright outdoor lighting.  Clear any walking paths of anything that might make someone trip, such as rocks or tools.  Regularly check to see if handrails are loose or broken. Make sure that both sides of any steps have handrails.  Any raised decks and porches should have guardrails on the edges.  Have any leaves, snow, or ice cleared regularly.  Use sand or salt on walking paths during winter.  Clean up any spills in your garage right away. This includes oil or grease spills. What can I do in the bathroom?  Use night lights.  Install grab bars by the toilet and in the tub and shower. Do not use towel bars as grab bars.  Use non-skid mats or decals in the tub or shower.  If you need to sit down in the shower, use a plastic, non-slip stool.  Keep the floor dry. Clean up any water that spills on the floor as soon as it happens.  Remove soap buildup in the tub or shower regularly.  Attach bath mats securely with double-sided  non-slip rug tape.  Do not have throw rugs and other things on the floor that can make you trip. What can I do in the bedroom?  Use night lights.  Make sure that you have a light by your bed that is easy to reach.  Do not use any sheets or blankets that are too big for your bed. They should not hang down onto the floor.  Have a firm chair that has side arms. You can use this for support while you get dressed.  Do not have throw rugs and other things on the floor that can make you trip. What can I do in the kitchen?  Clean up any spills right away.  Avoid walking on wet floors.  Keep items that you use a lot in easy-to-reach places.  If you need to reach something above you, use a strong step stool that has a grab bar.  Keep electrical cords out of the way.  Do not use floor polish or wax that makes floors slippery. If you must use wax, use non-skid floor wax.  Do not have throw rugs and other things on the floor that can make you trip.  What can I do with my stairs?  Do not leave any items on the stairs.  Make sure that there are handrails on both sides of the stairs and use them. Fix handrails that are broken or loose. Make sure that handrails are as long as the stairways.  Check any carpeting to make sure that it is firmly attached to the stairs. Fix any carpet that is loose or worn.  Avoid having throw rugs at the top or bottom of the stairs. If you do have throw rugs, attach them to the floor with carpet tape.  Make sure that you have a light switch at the top of the stairs and the bottom of the stairs. If you do not have them, ask someone to add them for you. What else can I do to help prevent falls?  Wear shoes that:  Do not have high heels.  Have rubber bottoms.  Are comfortable and fit you well.  Are closed at the toe. Do not wear sandals.  If you use a stepladder:  Make sure that it is fully opened. Do not climb a closed stepladder.  Make sure that both  sides of the stepladder are locked into place.  Ask someone to hold it for you, if possible.  Clearly mark and make sure that you can see:  Any grab bars or handrails.  First and last steps.  Where the edge of each step is.  Use tools that help you move around (mobility aids) if they are needed. These include:  Canes.  Walkers.  Scooters.  Crutches.  Turn on the lights when you go into a dark area. Replace any light bulbs as soon as they burn out.  Set up your furniture so you have a clear path. Avoid moving your furniture around.  If any of your floors are uneven, fix them.  If there are any pets around you, be aware of where they are.  Review your medicines with your doctor. Some medicines can make you feel dizzy. This can increase your chance of falling. Ask your doctor what other things that you can do to help prevent falls. This information is not intended to replace advice given to you by your health care provider. Make sure you discuss any questions you have with your health care provider. Document Released: 08/01/2009 Document Revised: 03/12/2016 Document Reviewed: 11/09/2014 Elsevier Interactive Patient Education  2017 [place] provider would like to you have your annual eye exam. Please contact your current eye doctor or here are some good options for you to contact.   [facility] Address: [address] [place], [place] [address]   Address: [address], [place], [place] [address]  Phone: [phone]      Phone: [phone]  Website: [email or web address]    Website: [email or web address]     [facility]  Address: [place], [place], [place] [address]   Address: [place], [place], [place] [address] Phone: [phone]      Phone: [phone]    [facility] Address: [place], [place], [place] [address]  Phone: [phone]

## 2018-04-27 ENCOUNTER — Ambulatory Visit (INDEPENDENT_AMBULATORY_CARE_PROVIDER_SITE_OTHER): Payer: Medicare Other | Admitting: Family Medicine

## 2018-04-27 ENCOUNTER — Encounter: Payer: Self-pay | Admitting: Family Medicine

## 2018-04-27 VITALS — BP 114/91 | HR 91 | Ht 69.0 in | Wt 218.0 lb

## 2018-04-27 DIAGNOSIS — I700 Atherosclerosis of aorta: Secondary | ICD-10-CM | POA: Diagnosis not present

## 2018-04-27 DIAGNOSIS — S41112A Laceration without foreign body of left upper arm, initial encounter: Secondary | ICD-10-CM

## 2018-04-27 DIAGNOSIS — E7800 Pure hypercholesterolemia, unspecified: Secondary | ICD-10-CM

## 2018-04-27 DIAGNOSIS — N400 Enlarged prostate without lower urinary tract symptoms: Secondary | ICD-10-CM | POA: Diagnosis not present

## 2018-04-27 DIAGNOSIS — E1142 Type 2 diabetes mellitus with diabetic polyneuropathy: Secondary | ICD-10-CM | POA: Diagnosis not present

## 2018-04-27 DIAGNOSIS — M5136 Other intervertebral disc degeneration, lumbar region: Secondary | ICD-10-CM | POA: Diagnosis not present

## 2018-04-27 DIAGNOSIS — E119 Type 2 diabetes mellitus without complications: Secondary | ICD-10-CM

## 2018-04-27 DIAGNOSIS — Z7189 Other specified counseling: Secondary | ICD-10-CM

## 2018-04-27 DIAGNOSIS — S41119A Laceration without foreign body of unspecified upper arm, initial encounter: Secondary | ICD-10-CM | POA: Insufficient documentation

## 2018-04-27 MED ORDER — PANTOPRAZOLE SODIUM 40 MG PO TBEC: 40.0000 mg | DELAYED_RELEASE_TABLET | Freq: Two times a day (BID) | ORAL | 4 refills | Status: DC

## 2018-04-27 MED ORDER — EPINEPHRINE 0.3 MG/0.3ML IJ SOAJ: 0.3000 mg | Freq: Once | INTRAMUSCULAR | 5 refills | Status: AC

## 2018-04-27 MED ORDER — DULOXETINE HCL 60 MG PO CPEP: 60.0000 mg | ORAL_CAPSULE | Freq: Every day | ORAL | 4 refills | Status: DC

## 2018-04-27 MED ORDER — ATORVASTATIN CALCIUM 20 MG PO TABS: 20.0000 mg | ORAL_TABLET | Freq: Every day | ORAL | 4 refills | Status: DC

## 2018-04-27 MED ORDER — TRAZODONE HCL 100 MG PO TABS: 100.0000 mg | ORAL_TABLET | Freq: Every evening | ORAL | 4 refills | Status: DC | PRN

## 2018-04-27 MED ORDER — PREGABALIN 50 MG PO CAPS: 50.0000 mg | ORAL_CAPSULE | Freq: Three times a day (TID) | ORAL | 1 refills | Status: DC

## 2018-04-27 MED ORDER — DOXYCYCLINE HYCLATE 100 MG PO TABS: 100.0000 mg | ORAL_TABLET | Freq: Two times a day (BID) | ORAL | 0 refills | Status: DC

## 2018-04-27 MED ORDER — SUCRALFATE 1 G PO TABS: 1.0000 g | ORAL_TABLET | Freq: Three times a day (TID) | ORAL | 4 refills | Status: DC

## 2018-04-27 MED ORDER — METFORMIN HCL 500 MG PO TABS: 1000.0000 mg | ORAL_TABLET | Freq: Two times a day (BID) | ORAL | 4 refills | Status: DC

## 2018-04-27 NOTE — Assessment & Plan Note (Signed)
Patient of arm healing well will give tetanus

## 2018-04-27 NOTE — Progress Notes (Signed)
BP (!) 114/91   Pulse 91   Ht 5\' 9"  (1.753 m)   Wt 218 lb (98.9 kg)   SpO2 95%   BMI 32.19 kg/m    Subjective:    Patient ID: Edward Flesher., male    DOB: Aug 03, 1956, 62 y.o.   MRN: [id number]  HPI: Edward Schneider. is a 62 y.o. male  Chief Complaint  Patient presents with  . Annual Exam    Patient states he continues to feel terrible w/ no energy   Patient with concern about tickborne illness as tick exposure 3 weeks ago or so multiple deer ticks and has generalized achiness and feeling bad. Gave doxycycline with patient education for sun exposure. Followed by pain clinic for chronic pain in his back. He is to have a fresh EpiPen for bee sting allergy. Takes metformin Jardiance for diabetes without issues or problems. Also takes atorvastatin without problems.  Relevant past medical, surgical, family and social history reviewed and updated as indicated. Interim medical history since our last visit reviewed. Allergies and medications reviewed and updated.  Review of Systems  Constitutional: Negative.   HENT: Negative.   Eyes: Negative.   Respiratory: Negative.   Cardiovascular: Negative.   Gastrointestinal: Negative.   Endocrine: Negative.   Genitourinary: Negative.   Musculoskeletal: Negative.   Skin: Negative.   Allergic/Immunologic: Negative.   Neurological: Negative.   Hematological: Negative.   Psychiatric/Behavioral: Negative.     Per HPI unless specifically indicated above     Objective:    BP (!) 114/91   Pulse 91   Ht 5\' 9"  (1.753 m)   Wt 218 lb (98.9 kg)   SpO2 95%   BMI 32.19 kg/m   Wt Readings from Last 3 Encounters:  04/27/18 218 lb (98.9 kg)  04/25/18 221 lb (100.2 kg)  02/08/18 229 lb (103.9 kg)    Physical Exam  Constitutional: He is oriented to person, place, and time. He appears well-developed and well-nourished.  HENT:  Head: Normocephalic and atraumatic.  Right Ear: External ear normal.  Left Ear: External ear normal.    Eyes: Pupils are equal, round, and reactive to light. Conjunctivae and EOM are normal.  Neck: Normal range of motion. Neck supple.  Cardiovascular: Normal rate, regular rhythm, normal heart sounds and intact distal pulses.  Pulmonary/Chest: Effort normal and breath sounds normal.  Abdominal: Soft. Bowel sounds are normal. There is no splenomegaly or hepatomegaly.  Genitourinary: Rectum normal, prostate normal and penis normal.  Musculoskeletal: Normal range of motion.  Neurological: He is alert and oriented to person, place, and time. He has normal reflexes.  Skin: No rash noted. No erythema.  Psychiatric: He has a normal mood and affect. His behavior is normal. Judgment and thought content normal.    Results for orders placed or performed in visit on 02/08/18  Bayer DCA Hb A1c Waived  Result Value Ref Range   HB A1C (BAYER DCA - WAIVED) 8.6 (H) <7.0 %      Assessment & Plan:   Problem List Items Addressed This Visit      Cardiovascular and Mediastinum   Atherosclerosis of abdominal aorta ([place])    The current medical regimen is effective;  continue present plan and medications.       Relevant Medications   EPINEPHrine 0.3 mg/0.3 mL IJ SOAJ injection   atorvastatin (LIPITOR) 20 MG tablet     Endocrine   DM type 2 with diabetic peripheral neuropathy ([place]) - Primary  The current medical regimen is effective;  continue present plan and medications. a      Relevant Medications   traZODone (DESYREL) 100 MG tablet   pregabalin (LYRICA) 50 MG capsule   metFORMIN (GLUCOPHAGE) 500 MG tablet   DULoxetine (CYMBALTA) 60 MG capsule   atorvastatin (LIPITOR) 20 MG tablet   Other Relevant Orders   Bayer DCA Hb A1c Waived   Microalbumin, Urine Waived     Musculoskeletal and Integument   DDD (degenerative disc disease), lumbar    Followed by pain clinic        Other   Hypercholesterolemia    The current medical regimen is effective;  continue present plan and medications.        Relevant Medications   EPINEPHrine 0.3 mg/0.3 mL IJ SOAJ injection   atorvastatin (LIPITOR) 20 MG tablet   Other Relevant Orders   Bayer DCA Hb A1c Waived   Advanced care planning/counseling discussion    A voluntary discussion about advanced care planning including explanation and discussion of advanced directives was extentively discussed with the patient.  Explained about the healthcare proxy and living will was reviewed and packet with forms with expiration of how to fill them out was given.  Time spent: Encounter 16+ min individuals present: Patient       Other Visit Diagnoses    Type 2 diabetes mellitus without complication, without long-term current use of insulin (HCC)       Relevant Medications   metFORMIN (GLUCOPHAGE) 500 MG tablet   atorvastatin (LIPITOR) 20 MG tablet       Follow up plan: Return in about 3 months (around 07/28/2018) for Hemoglobin A1c.

## 2018-04-27 NOTE — Assessment & Plan Note (Signed)
The current medical regimen is effective;  continue present plan and medications.  

## 2018-04-27 NOTE — Assessment & Plan Note (Signed)
The current medical regimen is effective;  continue present plan and medications. a 

## 2018-04-27 NOTE — Assessment & Plan Note (Signed)
Followed by pain clinic.  

## 2018-04-27 NOTE — Assessment & Plan Note (Signed)
A voluntary discussion about advanced care planning including explanation and discussion of advanced directives was extentively discussed with the patient.  Explained about the healthcare proxy and living will was reviewed and packet with forms with expiration of how to fill them out was given.  Time spent: Encounter 16+ min individuals present: Patient 

## 2018-04-28 ENCOUNTER — Telehealth: Payer: Self-pay | Admitting: Family Medicine

## 2018-04-28 DIAGNOSIS — G4733 Obstructive sleep apnea (adult) (pediatric): Secondary | ICD-10-CM | POA: Diagnosis not present

## 2018-04-28 LAB — COMPREHENSIVE METABOLIC PANEL
ALBUMIN: 4.6 g/dL (ref 3.6–4.8)
ALK PHOS: 96 IU/L (ref 39–117)
ALT: 25 IU/L (ref 0–44)
AST: 25 IU/L (ref 0–40)
Albumin/Globulin Ratio: 1.6 (ref 1.2–2.2)
BILIRUBIN TOTAL: 0.6 mg/dL (ref 0.0–1.2)
BUN/Creatinine Ratio: 11 (ref 10–24)
BUN: 10 mg/dL (ref 8–27)
CHLORIDE: 97 mmol/L (ref 96–106)
CO2: 22 mmol/L (ref 20–29)
Calcium: 9.5 mg/dL (ref 8.6–10.2)
Creatinine, Ser: 0.87 mg/dL (ref 0.76–1.27)
GFR calc non Af Amer: 93 mL/min/{1.73_m2} (ref >59)
GFR, EST AFRICAN AMERICAN: 108 mL/min/{1.73_m2} (ref >59)
GLUCOSE: 314 mg/dL — AB (ref 65–99)
Globulin, Total: 2.8 g/dL (ref 1.5–4.5)
Potassium: 4.4 mmol/L (ref 3.5–5.2)
Sodium: 137 mmol/L (ref 134–144)
Total Protein: 7.4 g/dL (ref 6.0–8.5)

## 2018-04-28 LAB — LIPID PANEL
Chol/HDL Ratio: 3.6 ratio (ref 0.0–5.0)
Cholesterol, Total: 142 mg/dL (ref 100–199)
HDL: 40 mg/dL (ref >39)
LDL Calculated: 73 mg/dL (ref 0–99)
Triglycerides: 143 mg/dL (ref 0–149)
VLDL CHOLESTEROL CAL: 29 mg/dL (ref 5–40)

## 2018-04-28 LAB — CBC WITH DIFFERENTIAL/PLATELET
Basophils Absolute: 0 10*3/uL (ref 0.0–0.2)
Basos: 0 %
EOS (ABSOLUTE): 0.7 10*3/uL — AB (ref 0.0–0.4)
Eos: 8 %
Hematocrit: 42.3 % (ref 37.5–51.0)
Hemoglobin: 13.6 g/dL (ref 13.0–17.7)
IMMATURE GRANULOCYTES: 0 %
Immature Grans (Abs): 0 10*3/uL (ref 0.0–0.1)
LYMPHS ABS: 2.9 10*3/uL (ref 0.7–3.1)
Lymphs: 32 %
MCH: 27.5 pg (ref 26.6–33.0)
MCHC: 32.2 g/dL (ref 31.5–35.7)
MCV: 86 fL (ref 79–97)
Monocytes Absolute: 0.6 10*3/uL (ref 0.1–0.9)
Monocytes: 7 %
NEUTROS PCT: 53 %
Neutrophils Absolute: 5 10*3/uL (ref 1.4–7.0)
Platelets: 198 10*3/uL (ref 150–450)
RBC: 4.95 x10E6/uL (ref 4.14–5.80)
RDW: 13.1 % (ref 12.3–15.4)
WBC: 9.2 10*3/uL (ref 3.4–10.8)

## 2018-04-28 LAB — URINALYSIS, ROUTINE W REFLEX MICROSCOPIC
BILIRUBIN UA: NEGATIVE
Ketones, UA: NEGATIVE
Leukocytes, UA: NEGATIVE
Nitrite, UA: NEGATIVE
PH UA: 5.5 (ref 5.0–7.5)
Protein, UA: NEGATIVE
RBC, UA: NEGATIVE
Specific Gravity, UA: 1.01 (ref 1.005–1.030)
UUROB: 0.2 mg/dL (ref 0.2–1.0)

## 2018-04-28 LAB — BAYER DCA HB A1C WAIVED: HB A1C (BAYER DCA - WAIVED): 10 % — ABNORMAL HIGH (ref <7.0)

## 2018-04-28 LAB — MICROALBUMIN, URINE WAIVED
CREATININE, URINE WAIVED: 100 mg/dL (ref 10–300)
MICROALB, UR WAIVED: 80 mg/L — AB (ref 0–19)
Microalb/Creat Ratio: <30 mg/g (ref <30)

## 2018-04-28 LAB — TSH: TSH: 3.19 u[IU]/mL (ref 0.450–4.500)

## 2018-04-28 LAB — PSA: PROSTATE SPECIFIC AG, SERUM: 0.5 ng/mL (ref 0.0–4.0)

## 2018-04-28 NOTE — Telephone Encounter (Signed)
Phone call Discussed with patient diabetes terrible control. Patient will not change any medications but change back to a better diet that he knows he should do and is done in the past.

## 2018-04-28 NOTE — Telephone Encounter (Signed)
-----   Message from Amada Kingfisher, [place] sent at 04/28/2018  4:38 PM EDT ----- Patient was transferred to provider for telephone conversation.

## 2018-05-16 DIAGNOSIS — E11610 Type 2 diabetes mellitus with diabetic neuropathic arthropathy: Secondary | ICD-10-CM | POA: Diagnosis not present

## 2018-05-16 DIAGNOSIS — Z5181 Encounter for therapeutic drug level monitoring: Secondary | ICD-10-CM | POA: Diagnosis not present

## 2018-05-16 DIAGNOSIS — G629 Polyneuropathy, unspecified: Secondary | ICD-10-CM | POA: Diagnosis not present

## 2018-05-16 DIAGNOSIS — M179 Osteoarthritis of knee, unspecified: Secondary | ICD-10-CM | POA: Diagnosis not present

## 2018-05-29 DIAGNOSIS — G4733 Obstructive sleep apnea (adult) (pediatric): Secondary | ICD-10-CM | POA: Diagnosis not present

## 2018-05-30 ENCOUNTER — Telehealth: Payer: Self-pay | Admitting: Family Medicine

## 2018-05-30 DIAGNOSIS — L989 Disorder of the skin and subcutaneous tissue, unspecified: Secondary | ICD-10-CM

## 2018-05-30 MED ORDER — TRIAMCINOLONE ACETONIDE 0.1 % EX CREA: 1.0000 "application " | TOPICAL_CREAM | Freq: Two times a day (BID) | CUTANEOUS | 2 refills | Status: DC

## 2018-05-30 NOTE — Telephone Encounter (Signed)
Copied from [place] [id number]. Topic: Quick Communication - Rx Refill/Question >> May 30, 2018  2:00 PM Keene Breath wrote: Medication: triamcinolone cream (KENALOG) 0.1 %  Patient called to request a refill for the above medication.  CB# [phone]  Preferred Pharmacy (with phone number or street name): [facility] [address], [place] - [place] [phone] (Phone) [phone] (Fax)

## 2018-05-30 NOTE — Addendum Note (Signed)
Addended by: Golden Pop A on: 05/30/2018 04:38 PM   Modules accepted: Orders

## 2018-05-30 NOTE — Telephone Encounter (Signed)
Incoming Kenalog 0.1% refill Last Refill:04/27/18 # 1 Last OV: 05/04/18 PCP: [id number]

## 2018-06-03 DIAGNOSIS — J449 Chronic obstructive pulmonary disease, unspecified: Secondary | ICD-10-CM | POA: Diagnosis not present

## 2018-06-03 DIAGNOSIS — G4733 Obstructive sleep apnea (adult) (pediatric): Secondary | ICD-10-CM | POA: Diagnosis not present

## 2018-06-03 DIAGNOSIS — E1140 Type 2 diabetes mellitus with diabetic neuropathy, unspecified: Secondary | ICD-10-CM | POA: Diagnosis not present

## 2018-06-03 DIAGNOSIS — E785 Hyperlipidemia, unspecified: Secondary | ICD-10-CM | POA: Diagnosis not present

## 2018-06-03 DIAGNOSIS — K219 Gastro-esophageal reflux disease without esophagitis: Secondary | ICD-10-CM | POA: Diagnosis not present

## 2018-06-13 DIAGNOSIS — E11610 Type 2 diabetes mellitus with diabetic neuropathic arthropathy: Secondary | ICD-10-CM | POA: Diagnosis not present

## 2018-06-13 DIAGNOSIS — G4733 Obstructive sleep apnea (adult) (pediatric): Secondary | ICD-10-CM | POA: Diagnosis not present

## 2018-06-13 DIAGNOSIS — Z5181 Encounter for therapeutic drug level monitoring: Secondary | ICD-10-CM | POA: Diagnosis not present

## 2018-06-13 DIAGNOSIS — G629 Polyneuropathy, unspecified: Secondary | ICD-10-CM | POA: Diagnosis not present

## 2018-06-13 DIAGNOSIS — M179 Osteoarthritis of knee, unspecified: Secondary | ICD-10-CM | POA: Diagnosis not present

## 2018-06-17 ENCOUNTER — Encounter: Payer: Self-pay | Admitting: Family Medicine

## 2018-06-17 ENCOUNTER — Other Ambulatory Visit: Payer: Self-pay

## 2018-06-17 ENCOUNTER — Ambulatory Visit (INDEPENDENT_AMBULATORY_CARE_PROVIDER_SITE_OTHER): Payer: Medicare Other | Admitting: Family Medicine

## 2018-06-17 VITALS — BP 128/85 | HR 71 | Temp 98.2°F | Ht 68.0 in | Wt 223.0 lb

## 2018-06-17 DIAGNOSIS — M25562 Pain in left knee: Secondary | ICD-10-CM | POA: Diagnosis not present

## 2018-06-17 NOTE — Progress Notes (Signed)
BP 128/85   Pulse 71   Temp 98.2 F (36.8 C) (Oral)   Ht 5\' 8"  (1.727 m)   Wt 223 lb (101.2 kg)   SpO2 96%   BMI 33.91 kg/m    Subjective:    Patient ID: Edward Schneider., male    DOB: 1955-12-29, 61 y.o.   MRN: [id number]  HPI: Edward Paci. is a 62 y.o. male  Chief Complaint  Patient presents with  . Knee Pain    left side/pt states he fell on his hose driveway last Wednesday and pain goes up to the hip   Here today with severe left knee pain after falling directly onto the concrete while working on a car about a week ago. Has had swelling toward lateral aspect, locking up, and significant pain with weight bearing. Using a knee brace with minimal relief and states his usual oxycodone regimen isn't even touching the pain. Denies redness, heat, abrasions, fevers.   Relevant past medical, surgical, family and social history reviewed and updated as indicated. Interim medical history since our last visit reviewed. Allergies and medications reviewed and updated.  Review of Systems  Per HPI unless specifically indicated above     Objective:    BP 128/85   Pulse 71   Temp 98.2 F (36.8 C) (Oral)   Ht 5\' 8"  (1.727 m)   Wt 223 lb (101.2 kg)   SpO2 96%   BMI 33.91 kg/m   Wt Readings from Last 3 Encounters:  06/19/18 223 lb (101.2 kg)  06/17/18 223 lb (101.2 kg)  04/27/18 218 lb (98.9 kg)    Physical Exam  Constitutional: He is oriented to person, place, and time. He appears well-developed and well-nourished. No distress.  HENT:  Head: Atraumatic.  Eyes: Conjunctivae and EOM are normal.  Neck: Normal range of motion. Neck supple.  Cardiovascular: Normal rate.  Pulmonary/Chest: Effort normal and breath sounds normal.  Musculoskeletal:  Antalgic gait Lateral patellar edema left knee TTP diffusely over left knee ROM mostly intact, exam limited by pt discomfort  Neurological: He is alert and oriented to person, place, and time.  Skin: Skin is warm and dry. No  erythema.  Psychiatric: He has a normal mood and affect. His behavior is normal.  Nursing note and vitals reviewed.   Results for orders placed or performed in visit on 04/27/18  Bayer DCA Hb A1c Waived  Result Value Ref Range   HB A1C (BAYER DCA - WAIVED) 10.0 (H) <7.0 %  Microalbumin, Urine Waived  Result Value Ref Range   Microalb, Ur Waived 80 (H) 0 - 19 mg/L   Creatinine, Urine Waived 100 10 - 300 mg/dL   Microalb/Creat Ratio <30 <30 mg/g  Comprehensive metabolic panel  Result Value Ref Range   Glucose 314 (H) 65 - 99 mg/dL   BUN 10 8 - 27 mg/dL   Creatinine, Ser 0.87 0.76 - 1.27 mg/dL   GFR calc non Af Amer 93 >59 mL/min/1.73   GFR calc Af Amer 108 >59 mL/min/1.73   BUN/Creatinine Ratio 11 10 - 24   Sodium 137 134 - 144 mmol/L   Potassium 4.4 3.5 - 5.2 mmol/L   Chloride 97 96 - 106 mmol/L   CO2 22 20 - 29 mmol/L   Calcium 9.5 8.6 - 10.2 mg/dL   Total Protein 7.4 6.0 - 8.5 g/dL   Albumin 4.6 3.6 - 4.8 g/dL   Globulin, Total 2.8 1.5 - 4.5 g/dL   Albumin/Globulin Ratio  1.6 1.2 - 2.2   Bilirubin Total 0.6 0.0 - 1.2 mg/dL   Alkaline Phosphatase 96 39 - 117 IU/L   AST 25 0 - 40 IU/L   ALT 25 0 - 44 IU/L  Lipid panel  Result Value Ref Range   Cholesterol, Total 142 100 - 199 mg/dL   Triglycerides 143 0 - 149 mg/dL   HDL 40 >39 mg/dL   VLDL Cholesterol Cal 29 5 - 40 mg/dL   LDL Calculated 73 0 - 99 mg/dL   Chol/HDL Ratio 3.6 0.0 - 5.0 ratio  CBC with Differential/Platelet  Result Value Ref Range   WBC 9.2 3.4 - 10.8 x10E3/uL   RBC 4.95 4.14 - 5.80 x10E6/uL   Hemoglobin 13.6 13.0 - 17.7 g/dL   Hematocrit 42.3 37.5 - 51.0 %   MCV 86 79 - 97 fL   MCH 27.5 26.6 - 33.0 pg   MCHC 32.2 31.5 - 35.7 g/dL   RDW 13.1 12.3 - 15.4 %   Platelets 198 150 - 450 x10E3/uL   Neutrophils 53 Not Estab. %   Lymphs 32 Not Estab. %   Monocytes 7 Not Estab. %   Eos 8 Not Estab. %   Basos 0 Not Estab. %   Neutrophils Absolute 5.0 1.4 - 7.0 x10E3/uL   Lymphocytes Absolute 2.9 0.7 - 3.1  x10E3/uL   Monocytes Absolute 0.6 0.1 - 0.9 x10E3/uL   EOS (ABSOLUTE) 0.7 (H) 0.0 - 0.4 x10E3/uL   Basophils Absolute 0.0 0.0 - 0.2 x10E3/uL   Immature Granulocytes 0 Not Estab. %   Immature Grans (Abs) 0.0 0.0 - 0.1 x10E3/uL  TSH  Result Value Ref Range   TSH 3.190 0.450 - 4.500 uIU/mL  Urinalysis, Routine w reflex microscopic  Result Value Ref Range   Specific Gravity, UA 1.010 1.005 - 1.030   pH, UA 5.5 5.0 - 7.5   Color, UA Yellow Yellow   Appearance Ur Clear Clear   Leukocytes, UA Negative Negative   Protein, UA Negative Negative/Trace   Glucose, UA 2+ (A) Negative   Ketones, UA Negative Negative   RBC, UA Negative Negative   Bilirubin, UA Negative Negative   Urobilinogen, Ur 0.2 0.2 - 1.0 mg/dL   Nitrite, UA Negative Negative  PSA  Result Value Ref Range   Prostate Specific Ag, Serum 0.5 0.0 - 4.0 ng/mL      Assessment & Plan:   Problem List Items Addressed This Visit    None    Visit Diagnoses    Acute pain of left knee    -  Primary    Recommended he go to [facility] as we would not be able to get an x-ray report back before weekend from our clinic and he is in significant pain. Address given, pt will head there for imaging and further management. RICE, brace, continued pain regimen.   Follow up plan: Return if symptoms worsen or fail to improve.

## 2018-06-17 NOTE — Patient Instructions (Addendum)
[address] [place], [place] [address] Closes at 7:30 [phone]

## 2018-06-19 ENCOUNTER — Emergency Department: Payer: Medicare Other

## 2018-06-19 ENCOUNTER — Encounter: Payer: Self-pay | Admitting: Emergency Medicine

## 2018-06-19 ENCOUNTER — Emergency Department
Admission: EM | Admit: 2018-06-19 | Discharge: 2018-06-19 | Disposition: A | Discharge status: Home / Self Care | Payer: Medicare Other | Attending: Emergency Medicine | Admitting: Emergency Medicine

## 2018-06-19 DIAGNOSIS — M25562 Pain in left knee: Secondary | ICD-10-CM | POA: Insufficient documentation

## 2018-06-19 DIAGNOSIS — Z79899 Other long term (current) drug therapy: Secondary | ICD-10-CM | POA: Diagnosis not present

## 2018-06-19 DIAGNOSIS — Z7982 Long term (current) use of aspirin: Secondary | ICD-10-CM | POA: Insufficient documentation

## 2018-06-19 DIAGNOSIS — G8929 Other chronic pain: Secondary | ICD-10-CM | POA: Diagnosis not present

## 2018-06-19 DIAGNOSIS — Z7984 Long term (current) use of oral hypoglycemic drugs: Secondary | ICD-10-CM | POA: Insufficient documentation

## 2018-06-19 DIAGNOSIS — I10 Essential (primary) hypertension: Secondary | ICD-10-CM | POA: Insufficient documentation

## 2018-06-19 DIAGNOSIS — S79912A Unspecified injury of left hip, initial encounter: Secondary | ICD-10-CM | POA: Diagnosis not present

## 2018-06-19 DIAGNOSIS — Z87891 Personal history of nicotine dependence: Secondary | ICD-10-CM | POA: Diagnosis not present

## 2018-06-19 DIAGNOSIS — M545 Low back pain: Secondary | ICD-10-CM | POA: Diagnosis not present

## 2018-06-19 DIAGNOSIS — J449 Chronic obstructive pulmonary disease, unspecified: Secondary | ICD-10-CM | POA: Diagnosis not present

## 2018-06-19 DIAGNOSIS — E1140 Type 2 diabetes mellitus with diabetic neuropathy, unspecified: Secondary | ICD-10-CM | POA: Insufficient documentation

## 2018-06-19 DIAGNOSIS — W19XXXA Unspecified fall, initial encounter: Secondary | ICD-10-CM

## 2018-06-19 DIAGNOSIS — S8992XA Unspecified injury of left lower leg, initial encounter: Secondary | ICD-10-CM | POA: Diagnosis not present

## 2018-06-19 DIAGNOSIS — M25552 Pain in left hip: Secondary | ICD-10-CM | POA: Diagnosis not present

## 2018-06-19 MED ORDER — KETOROLAC TROMETHAMINE 60 MG/2ML IM SOLN
60.0000 mg | Freq: Once | INTRAMUSCULAR | Status: AC
  Administered 2018-06-19: 60 mg via INTRAMUSCULAR
  Filled 2018-06-19: qty 2

## 2018-06-19 NOTE — ED Triage Notes (Signed)
Pt reports fall last Wednesday. Pt reports pain from his left foot that "goes all the way up to my hip." Pt has knee brace on left knee.

## 2018-06-19 NOTE — Discharge Instructions (Addendum)
You have been diagnosed with acute left hip and knee pain.  Your x-rays were negative for any acute findings.  We gave you a Toradol shot today.  Continue your oxycodone and Flexeril as previously prescribed.  Okay to continue knee brace for comfort.  Ice may be helpful.  I have given you some hip and knee exercises to do at home.  Follow-up with your PCP in 2 days if symptoms persist or worsen.

## 2018-06-19 NOTE — ED Notes (Signed)
First Nurse Note: Pt fell Wednesday, states that he is unable to put weight left leg.

## 2018-06-19 NOTE — ED Provider Notes (Signed)
[facility] Emergency Department Provider Note ____________________________________________  Time seen: 16  I have reviewed the triage vital signs and the nursing notes.  HISTORY  Chief Complaint  Fall   HPI Edward Schneider. is a 62 y.o. male presents to the ER today with complaint of left hip pain and left knee pain after a fall that occurred 5 days ago.  He reports he slipped and fell on his left side in his driveway.  He describes the pain is sore, achy but is sharp, having and shooting with movement or ambulation.  He reports associated swelling but has not seen any bruising or abrasions.  He reports history of left knee surgery many years ago.  He reports he was evaluated for this by his PCP, x-rays were ordered but he never had them done.  He has a history of chronic back pain for which he follows with Dr. Camelia Phenes.  He is taking Percocet as prescribed with minimal relief.  He is also tried heat, but no ice.  Past Medical History:  Diagnosis Date  . Allergy   . Asthma   . C. difficile diarrhea   . Cancer [facility]) June 2016   liver cancer  . Chronic pain   . DDD (degenerative disc disease), cervical   . DDD (degenerative disc disease), lumbar   . Diabetes mellitus without complication ([place])   . GERD (gastroesophageal reflux disease)   . Headache    migraines - none since 02/17  . Hypertension   . Low blood sugar   . Myocardial infarction ([place])   . Seizures ([place])    several as child when sick.  None since age 80  . Stroke [facility])    'mini-stroke" 30 yrs ago. no deficits.  . Wears dentures    full upper and lower    Patient Active Problem List   Diagnosis Date Noted  . BPH (benign prostatic hyperplasia) 04/27/2018  . Laceration of arm 04/27/2018  . Knee pain, right 02/08/2018  . Sleep apnea 02/08/2018  . Other chest pain   . GERD (gastroesophageal reflux disease) 04/04/2017  . Advanced care planning/counseling discussion 03/29/2017  .  DM type 2 with diabetic peripheral neuropathy ([place]) 09/30/2016  . Skin lesions, generalized 09/24/2016  . Insomnia 12/24/2015  . Allergy to bee sting 12/12/2015  . Hypercholesterolemia 09/23/2015  . Carotid artery narrowing 09/11/2015  . Atherosclerosis of abdominal aorta ([place]) 08/26/2015  . Migraine headache 08/21/2015  . Hereditary and idiopathic peripheral neuropathy 05/22/2015  . COPD (chronic obstructive pulmonary disease) ([place]) 04/25/2015  . DDD (degenerative disc disease), cervical 03/27/2015  . DDD (degenerative disc disease), lumbar 03/27/2015  . Cervical post-laminectomy syndrome 03/27/2015  . Bilateral occipital neuralgia 03/27/2015  . Pancreatic insufficiency 01/28/2015  . Back pain, chronic 12/24/2014  . 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 APPENDECTOMY    . BACK SURGERY    . CARDIAC CATHETERIZATION     stent placed in his "30's"  . COLONOSCOPY WITH PROPOFOL N/A 03/06/2016   Procedure: COLONOSCOPY WITH PROPOFOL;  Surgeon: Lucilla Lame, MD;  Location: [facility];  Service: Endoscopy;  Laterality: N/A;  requests early  . ESOPHAGOGASTRODUODENOSCOPY (EGD) WITH PROPOFOL N/A 09/20/2017   Procedure: ESOPHAGOGASTRODUODENOSCOPY (EGD) WITH PROPOFOL;  Surgeon: Lucilla Lame, MD;  Location: [facility];  Service: Endoscopy;  Laterality: N/A;  Diabetic - oral meds  . FINGER SURGERY Left   . KNEE SURGERY Right   .  NECK SURGERY    . spleen surg    . spleen surgery    . TOE SURGERY Right     Prior to Admission medications   Medication Sig Start Date End Date Taking? Authorizing Provider  ACCU-CHEK AVIVA PLUS test strip USE TEST STRIP(S) TO CHECK GLUCOSE ONCE DAILY 06/30/17   Guadalupe Maple, MD  ACCU-CHEK SOFTCLIX LANCETS lancets USE TO CHECK GLUCOSE ONCE DAILY 06/30/17   Guadalupe Maple, MD  aspirin EC 81 MG tablet Take 162 mg by mouth daily.     [provider]  atorvastatin (LIPITOR) 20 MG  tablet Take 1 tablet (20 mg total) by mouth daily. 04/27/18   Guadalupe Maple, MD  cyclobenzaprine (FLEXERIL) 10 MG tablet Take 1 tablet (10 mg total) by mouth 3 (three) times daily as needed for muscle spasms. 01/07/18   Lucilla Lame, MD  diphenhydrAMINE (BENADRYL) 25 mg capsule Take 25 mg by mouth 2 (two) times daily.     [provider]  diphenoxylate-atropine (LOMOTIL) 2.5-0.025 MG tablet Take 1 tablet by mouth 4 (four) times daily as needed for diarrhea or loose stools. 09/29/17   Lucilla Lame, MD  doxycycline (VIBRA-TABS) 100 MG tablet Take 1 tablet (100 mg total) by mouth 2 (two) times daily. 04/27/18   Guadalupe Maple, MD  DULoxetine (CYMBALTA) 60 MG capsule Take 1 capsule (60 mg total) by mouth daily. 04/27/18   Guadalupe Maple, MD  Ginseng 100 MG CAPS Take by mouth.    [provider]  Ibuprofen (ADVIL MIGRAINE) 200 MG CAPS Take by mouth.    [provider]  JARDIANCE 25 MG TABS tablet Take 25 mg by mouth daily. 02/24/17   [provider]  metFORMIN (GLUCOPHAGE) 500 MG tablet Take 2 tablets (1,000 mg total) by mouth 2 (two) times daily with a meal. 04/27/18   Crissman, Jeannette How, MD  Multiple Vitamin (MULTIVITAMIN) capsule Take 1 capsule by mouth daily.    [provider]  Omega-3 1000 MG CAPS Take by mouth.    [provider]  oxyCODONE (ROXICODONE) 5 MG immediate release tablet Limit one tablet by mouth 3-6  times per day if tolerated Patient taking differently: Limit one tablet by mouth 3-6  times per day if tolerated, pt states he is taking 6 tablets daily 06/11/16   Mohammed Kindle, MD  pantoprazole (PROTONIX) 40 MG tablet Take 1 tablet (40 mg total) by mouth 2 (two) times daily. 04/27/18   Guadalupe Maple, MD  pregabalin (LYRICA) 50 MG capsule Take 1 capsule (50 mg total) by mouth 3 (three) times daily. 04/27/18   Guadalupe Maple, MD  sucralfate (CARAFATE) 1 g tablet Take 1 tablet (1 g total) by mouth 4 (four) times daily -  with meals and  at bedtime. 04/27/18   Guadalupe Maple, MD  traZODone (DESYREL) 100 MG tablet Take 1 tablet (100 mg total) by mouth at bedtime as needed for sleep. 04/27/18   Guadalupe Maple, MD  triamcinolone cream (KENALOG) 0.1 % Apply 1 application topically 2 (two) times daily. 05/30/18   Guadalupe Maple, MD  vitamin B-12 (CYANOCOBALAMIN) 1000 MCG tablet Take 1,000 mcg by mouth daily.    [provider]    Allergies Bee pollen; Bee venom; Crestor [rosuvastatin calcium]; Fentanyl; Shellfish allergy; Gabapentin; Morphine; Morphine and related; Simvastatin; and Buprenorphine hcl  Family History  Problem Relation Age of Onset  . Arthritis Mother   . Diabetes Mother   . Kidney disease Mother   .  Heart disease Mother   . Hypertension Mother   . Arthritis Father   . Hearing loss Father   . Hypertension Father   . Diabetes Sister   . Heart disease Sister   . Diabetes Daughter   . Diabetes Maternal Aunt   . Diabetes Maternal Grandmother     Social History Social History   Tobacco Use  . Smoking status: Former Smoker    Packs/day: 2.00    Years: 50.00    Pack years: 100.00    Types: Cigarettes    Last attempt to quit: 07/04/2012    Years since quitting: 5.9  . Smokeless tobacco: Never Used  Substance Use Topics  . Alcohol use: No    Alcohol/week: 0.0 standard drinks  . Drug use: No    Review of Systems  Constitutional: Negative for fever. Musculoskeletal: Positive for chronic back pain, acute left hip pain and acute left knee pain.   Skin: Negative for bruising or abrasion. Neurological: Positive for numbness of bilateral lower extremities.  Negative for focal weakness or tingling. ____________________________________________  PHYSICAL EXAM:  VITAL SIGNS: ED Triage Vitals  Enc Vitals Group     BP 06/19/18 1846 (!) 143/91     Pulse Rate 06/19/18 1846 65     Resp 06/19/18 1846 18     Temp 06/19/18 1848 97.7 F (36.5 C)     Temp Source 06/19/18 1846 Oral     SpO2  06/19/18 1846 95 %     Weight 06/19/18 1846 223 lb (101.2 kg)     Height 06/19/18 1846 5\' 8"  (1.727 m)     Head Circumference --      Peak Flow --      Pain Score 06/19/18 1845 10     Pain Loc --      Pain Edu? --      Excl. in [place]? --     Constitutional: Alert and oriented.  Obese Musculoskeletal: Decreased flexion, extension and rotation of the spine.  Decreased abduction and abduction of the left hip.  Unable to perform hip flexion, extension, internal or external rotation due to pain.  Pain with palpation of the anterior iliac crest, posterior iliac crest, left SI joint.  No pain with palpation of the left trochanteric bursa.  Decreased flexion of the left knee.  Normal extension of the left knee.  1+ nonpitting peri-patella edema.  Pain with palpation of the medial pes bursa and medial joint line.  Normal flexion, extension and rotation of the left ankle.  Strength 4/5 LLE, strength 5/5 RLE.  Gait slow, limping. Neurologic: Sensation intact but decreased to bilateral lower extremities. Skin: No bruising or abrasion noted. ____________________________________________   RADIOLOGY   Imaging Orders     DG Knee Complete 4 Views Left     DG Hip Unilat W or Wo Pelvis 2-3 Views Left  ____________________________________________  INITIAL IMPRESSION / ASSESSMENT AND PLAN / ED COURSE  Chronic Back Pain, Acute Left Hip Pain and Acute Left Knee Pain status post Fall:  Toradol 60 mg IM today Xray left hip negative Xray left knee with tricompartmental degenerative changes, no acute abnormality He already has RX for Oxycodone and Flexeril, advised him to use as directed Encouraged ice Hip and knee exercises given Ok to continue to wear knee brace for comfort    I reviewed the patient's prescription history over the last 12 months in the multi-state controlled substances database(s) that includes [place], [place], [place], [place], [place], [place], [place], [place], [place]  [place], [place], [place], [place], [place], and [place].  Results were notable for Oxycodone 5 mg, #180 montly since 10/2017. ____________________________________________  FINAL CLINICAL IMPRESSION(S) / ED DIAGNOSES  Final diagnoses:  Fall, initial encounter  Chronic midline low back pain, with sciatica presence unspecified  Acute hip pain, left  Acute pain of left knee      Jearld Fenton, NP 06/19/18 2031    Orbie Pyo, MD 06/19/18 2229

## 2018-06-19 NOTE — ED Notes (Signed)
Patient transported to X-ray 

## 2018-06-21 ENCOUNTER — Telehealth: Payer: Self-pay | Admitting: Family Medicine

## 2018-06-21 NOTE — Telephone Encounter (Signed)
I did not tell him I would be giving him pain medication, and furthermore I am not able to as he's already on oxycodone through the pain clinic. I see in his ER note they told him to just continue his normal pain regimen  Copied from [place] [id number]. Topic: General - Other >> Jun 17, 2018  4:41 PM Yvette Rack wrote: Reason for CRM: pt calling stating that he was told that he was going to get some medicine for pain called into his pharmacy   [place], [place] - [place] [phone] (Phone) [phone] (Fax)  Please call pt when RX has been sent at [phone]

## 2018-06-21 NOTE — Telephone Encounter (Signed)
Patient stated he did not need pain medicine. He needs something done.   Patient states he's in excruciating pain in his knee and hip. I asked patient maybe a referral to orthopedics but it's up to [place]. Patient said he didn't know what needs to be done. He has an appointment with [place] Thursday for a hospital F/U.  Please advise.

## 2018-06-21 NOTE — Telephone Encounter (Signed)
Happy to refer him to orthopedics, there was no fracture on x-ray so maybe they can do a joint injection to help with his pain while things heal. He does not need that follow up just for the knee pain, I will just refer if needed.

## 2018-06-22 NOTE — Telephone Encounter (Signed)
Patient has medicare so he has to be seen tomorrow. Do you still want me to call and tell patient this or wait till his appointment tomorrow?

## 2018-06-22 NOTE — Telephone Encounter (Signed)
Yes, still call and let him know that we will plan to refer him to orthopedics for further management. Thank you

## 2018-06-22 NOTE — Telephone Encounter (Signed)
Patient notified and verbalized understanding. Patient will be here for his appointment tomorrow.

## 2018-06-23 ENCOUNTER — Other Ambulatory Visit: Payer: Self-pay

## 2018-06-23 ENCOUNTER — Ambulatory Visit (INDEPENDENT_AMBULATORY_CARE_PROVIDER_SITE_OTHER): Payer: Medicare Other | Admitting: Family Medicine

## 2018-06-23 ENCOUNTER — Encounter: Payer: Self-pay | Admitting: Family Medicine

## 2018-06-23 VITALS — BP 119/71 | HR 60 | Temp 97.7°F | Ht 68.0 in | Wt 214.1 lb

## 2018-06-23 DIAGNOSIS — Z23 Encounter for immunization: Secondary | ICD-10-CM

## 2018-06-23 DIAGNOSIS — M25562 Pain in left knee: Secondary | ICD-10-CM | POA: Diagnosis not present

## 2018-06-23 MED ORDER — DICLOFENAC SODIUM 1 % TD GEL: 2.0000 g | Freq: Four times a day (QID) | TRANSDERMAL | 3 refills | Status: DC

## 2018-06-23 NOTE — Patient Instructions (Signed)

## 2018-06-23 NOTE — Progress Notes (Signed)
BP 119/71 (BP Location: Right Arm, Patient Position: Sitting, Cuff Size: Normal)   Pulse 60   Temp 97.7 F (36.5 C) (Oral)   Ht 5\' 8"  (1.727 m)   Wt 214 lb 1.6 oz (97.1 kg)   SpO2 97%   BMI 32.55 kg/m    Subjective:    Patient ID: Edward Flesher., male    DOB: 1956/03/14, 62 y.o.   MRN: [id number]  HPI: Edward Hinde. is a 62 y.o. male  Chief Complaint  Patient presents with  . Hospitalization Follow-up  . Fall    06/15/2018  . Back Pain  . Leg Pain   Patient here today for ER f/u for left hip and knee pain after a fall. Had negative x-rays in ER. Given toradol IM in clinic and told to continue oxycodone and flexeril regimen as well as his knee brace. Pain has not improved whatsoever and he states he can't even sleep from it. Still using brace and pain medications. No significant edema, erythema, wounds.   Relevant past medical, surgical, family and social history reviewed and updated as indicated. Interim medical history since our last visit reviewed. Allergies and medications reviewed and updated.  Review of Systems  Per HPI unless specifically indicated above     Objective:    BP 119/71 (BP Location: Right Arm, Patient Position: Sitting, Cuff Size: Normal)   Pulse 60   Temp 97.7 F (36.5 C) (Oral)   Ht 5\' 8"  (1.727 m)   Wt 214 lb 1.6 oz (97.1 kg)   SpO2 97%   BMI 32.55 kg/m   Wt Readings from Last 3 Encounters:  06/23/18 214 lb 1.6 oz (97.1 kg)  06/19/18 223 lb (101.2 kg)  06/17/18 223 lb (101.2 kg)    Physical Exam  Constitutional: He is oriented to person, place, and time. He appears well-developed and well-nourished. No distress.  HENT:  Head: Atraumatic.  Eyes: Conjunctivae and EOM are normal.  Neck: Neck supple.  Cardiovascular: Normal rate and regular rhythm.  Pulmonary/Chest: Effort normal and breath sounds normal.  Musculoskeletal: He exhibits no edema or deformity.  Antalgic gait Exam limited by pt pain, he declines ROM exam    Neurological: He is alert and oriented to person, place, and time.  Skin: Skin is warm and dry.  Psychiatric: He has a normal mood and affect. His behavior is normal.  Nursing note and vitals reviewed.   Results for orders placed or performed in visit on 04/27/18  Bayer DCA Hb A1c Waived  Result Value Ref Range   HB A1C (BAYER DCA - WAIVED) 10.0 (H) <7.0 %  Microalbumin, Urine Waived  Result Value Ref Range   Microalb, Ur Waived 80 (H) 0 - 19 mg/L   Creatinine, Urine Waived 100 10 - 300 mg/dL   Microalb/Creat Ratio <30 <30 mg/g  Comprehensive metabolic panel  Result Value Ref Range   Glucose 314 (H) 65 - 99 mg/dL   BUN 10 8 - 27 mg/dL   Creatinine, Ser 0.87 0.76 - 1.27 mg/dL   GFR calc non Af Amer 93 >59 mL/min/1.73   GFR calc Af Amer 108 >59 mL/min/1.73   BUN/Creatinine Ratio 11 10 - 24   Sodium 137 134 - 144 mmol/L   Potassium 4.4 3.5 - 5.2 mmol/L   Chloride 97 96 - 106 mmol/L   CO2 22 20 - 29 mmol/L   Calcium 9.5 8.6 - 10.2 mg/dL   Total Protein 7.4 6.0 - 8.5 g/dL  Albumin 4.6 3.6 - 4.8 g/dL   Globulin, Total 2.8 1.5 - 4.5 g/dL   Albumin/Globulin Ratio 1.6 1.2 - 2.2   Bilirubin Total 0.6 0.0 - 1.2 mg/dL   Alkaline Phosphatase 96 39 - 117 IU/L   AST 25 0 - 40 IU/L   ALT 25 0 - 44 IU/L  Lipid panel  Result Value Ref Range   Cholesterol, Total 142 100 - 199 mg/dL   Triglycerides 143 0 - 149 mg/dL   HDL 40 >39 mg/dL   VLDL Cholesterol Cal 29 5 - 40 mg/dL   LDL Calculated 73 0 - 99 mg/dL   Chol/HDL Ratio 3.6 0.0 - 5.0 ratio  CBC with Differential/Platelet  Result Value Ref Range   WBC 9.2 3.4 - 10.8 x10E3/uL   RBC 4.95 4.14 - 5.80 x10E6/uL   Hemoglobin 13.6 13.0 - 17.7 g/dL   Hematocrit 42.3 37.5 - 51.0 %   MCV 86 79 - 97 fL   MCH 27.5 26.6 - 33.0 pg   MCHC 32.2 31.5 - 35.7 g/dL   RDW 13.1 12.3 - 15.4 %   Platelets 198 150 - 450 x10E3/uL   Neutrophils 53 Not Estab. %   Lymphs 32 Not Estab. %   Monocytes 7 Not Estab. %   Eos 8 Not Estab. %   Basos 0 Not  Estab. %   Neutrophils Absolute 5.0 1.4 - 7.0 x10E3/uL   Lymphocytes Absolute 2.9 0.7 - 3.1 x10E3/uL   Monocytes Absolute 0.6 0.1 - 0.9 x10E3/uL   EOS (ABSOLUTE) 0.7 (H) 0.0 - 0.4 x10E3/uL   Basophils Absolute 0.0 0.0 - 0.2 x10E3/uL   Immature Granulocytes 0 Not Estab. %   Immature Grans (Abs) 0.0 0.0 - 0.1 x10E3/uL  TSH  Result Value Ref Range   TSH 3.190 0.450 - 4.500 uIU/mL  Urinalysis, Routine w reflex microscopic  Result Value Ref Range   Specific Gravity, UA 1.010 1.005 - 1.030   pH, UA 5.5 5.0 - 7.5   Color, UA Yellow Yellow   Appearance Ur Clear Clear   Leukocytes, UA Negative Negative   Protein, UA Negative Negative/Trace   Glucose, UA 2+ (A) Negative   Ketones, UA Negative Negative   RBC, UA Negative Negative   Bilirubin, UA Negative Negative   Urobilinogen, Ur 0.2 0.2 - 1.0 mg/dL   Nitrite, UA Negative Negative  PSA  Result Value Ref Range   Prostate Specific Ag, Serum 0.5 0.0 - 4.0 ng/mL      Assessment & Plan:   Problem List Items Addressed This Visit    None    Visit Diagnoses    Acute pain of left knee    -  Primary   Referral placed to orthopedics for further management. Continue current regimen   Relevant Orders   AMB referral to orthopedics   Need for influenza vaccination       Relevant Orders   Flu Vaccine QUAD 36+ mos IM (Completed)       Follow up plan: Return for as scheduled.

## 2018-06-27 ENCOUNTER — Other Ambulatory Visit: Payer: Self-pay | Admitting: Family Medicine

## 2018-06-27 NOTE — Telephone Encounter (Signed)
Gabapentin refill Last Refill:04/27/18 #90 with 1 refill Last OV: 06/23/18 PCP: Dr. Jeananne Rama

## 2018-06-29 DIAGNOSIS — G4733 Obstructive sleep apnea (adult) (pediatric): Secondary | ICD-10-CM | POA: Diagnosis not present

## 2018-07-11 DIAGNOSIS — E11610 Type 2 diabetes mellitus with diabetic neuropathic arthropathy: Secondary | ICD-10-CM | POA: Diagnosis not present

## 2018-07-11 DIAGNOSIS — G629 Polyneuropathy, unspecified: Secondary | ICD-10-CM | POA: Diagnosis not present

## 2018-07-11 DIAGNOSIS — Z5181 Encounter for therapeutic drug level monitoring: Secondary | ICD-10-CM | POA: Diagnosis not present

## 2018-07-11 DIAGNOSIS — M179 Osteoarthritis of knee, unspecified: Secondary | ICD-10-CM | POA: Diagnosis not present

## 2018-07-14 DIAGNOSIS — G4733 Obstructive sleep apnea (adult) (pediatric): Secondary | ICD-10-CM | POA: Diagnosis not present

## 2018-07-21 ENCOUNTER — Telehealth: Payer: Self-pay | Admitting: Family Medicine

## 2018-07-21 NOTE — Telephone Encounter (Signed)
Copied from [place] [id number]. Topic: General - Other >> Jul 21, 2018  2:50 PM Janace Aris A wrote: Reason for CRM: patient called in wanting to let his PCP know that his C-pap machine is making him sick, to the point he  is feeling weak with stomach pains.   And wants to know what he can do about this.

## 2018-07-21 NOTE — Telephone Encounter (Signed)
-----   Message from Jerene Pitch, Iatan sent at 07/21/2018  4:57 PM EDT ----- Contact: call  ----- Message ----- From: Guadalupe Maple, MD Sent: 07/21/2018   4:42 PM EDT To: [facility]

## 2018-07-21 NOTE — Telephone Encounter (Signed)
Phone call Discussed with patient machine not causing sickness patient also having some water in his nose. Patient does not have a heated hose that he knows of will use less water has [facility] will keep this appointment.  Encouraged to continue to use of his machine.

## 2018-07-22 ENCOUNTER — Ambulatory Visit: Payer: Self-pay

## 2018-07-22 ENCOUNTER — Telehealth: Payer: Self-pay | Admitting: Family Medicine

## 2018-07-22 NOTE — Telephone Encounter (Signed)
Patient called, he says he is dizzy and blurred vision for the past 15 minutes. He says his blood sugar meter quit working and he needs it today to check his blood sugar and would like one sent to the pharmacy. He says my blood sugar is up that's why I'm dizzy and vision is blurred. I asked the type of meter, he says Accucheck Aviva. I asked him to hold while I call to the office. I called and spoke to [place], [place] who asked if the patient's wife is there, I asked the patient who says yes. Christan asked me to transfer him to the office, I transferred the patient to the office.

## 2018-07-22 NOTE — Telephone Encounter (Signed)
Order for new meter signed and faxed to pharmacy

## 2018-07-22 NOTE — Telephone Encounter (Deleted)
Copied from [place] #[id number]. Topic: Quick Communication - See Telephone Encounter >> Jul 22, 2018  4:22 PM Gardiner Ramus wrote: CRM for notification. See Telephone encounter for: 07/22/18. Pt called and stated that he his blood pressure machine is not working. Pt would like a new one called in. Please advise

## 2018-07-25 ENCOUNTER — Other Ambulatory Visit: Payer: Self-pay | Admitting: Family Medicine

## 2018-07-25 NOTE — Telephone Encounter (Signed)
Pt requesting accu-chek aviva pl kit.  PCP Golden Pop  [id number]  LOV  06/23/18  NOV  08/03/18

## 2018-07-29 DIAGNOSIS — G4733 Obstructive sleep apnea (adult) (pediatric): Secondary | ICD-10-CM | POA: Diagnosis not present

## 2018-08-03 ENCOUNTER — Ambulatory Visit (INDEPENDENT_AMBULATORY_CARE_PROVIDER_SITE_OTHER): Payer: Medicare Other | Admitting: Family Medicine

## 2018-08-03 ENCOUNTER — Encounter: Payer: Self-pay | Admitting: Family Medicine

## 2018-08-03 VITALS — BP 142/91 | HR 86 | Temp 98.5°F | Ht 68.0 in | Wt 219.8 lb

## 2018-08-03 DIAGNOSIS — I10 Essential (primary) hypertension: Secondary | ICD-10-CM | POA: Diagnosis not present

## 2018-08-03 DIAGNOSIS — M4690 Unspecified inflammatory spondylopathy, site unspecified: Secondary | ICD-10-CM | POA: Diagnosis not present

## 2018-08-03 DIAGNOSIS — E1142 Type 2 diabetes mellitus with diabetic polyneuropathy: Secondary | ICD-10-CM

## 2018-08-03 DIAGNOSIS — J449 Chronic obstructive pulmonary disease, unspecified: Secondary | ICD-10-CM

## 2018-08-03 LAB — BAYER DCA HB A1C WAIVED: HB A1C (BAYER DCA - WAIVED): 9.3 % — ABNORMAL HIGH (ref <7.0)

## 2018-08-03 MED ORDER — BENAZEPRIL HCL 20 MG PO TABS: 20.0000 mg | ORAL_TABLET | Freq: Every day | ORAL | 3 refills | Status: DC

## 2018-08-03 MED ORDER — SITAGLIPTIN PHOSPHATE 100 MG PO TABS: 100.0000 mg | ORAL_TABLET | Freq: Every day | ORAL | 4 refills | Status: DC

## 2018-08-03 NOTE — Assessment & Plan Note (Signed)
Followed by pain clinic and stable

## 2018-08-03 NOTE — Progress Notes (Signed)
BP (!) 142/91   Pulse 86   Temp 98.5 F (36.9 C) (Oral)   Ht 5\' 8"  (1.727 m)   Wt 219 lb 12.8 oz (99.7 kg)   SpO2 96%   BMI 33.42 kg/m    Subjective:    Patient ID: Edward Flesher., male    DOB: 1956/07/10, 62 y.o.   MRN: [id number]  HPI: Edward Fairhurst. is a 62 y.o. male  Chief Complaint  Patient presents with  . Diabetes    pt states he has not had a recent eye exam  . Hyperlipidemia  . Hypertension  Patient to follow-up has only been taking metformin for diabetes. Has not been taking Jardiance due to cost. Patient with a lot of issues and has a lot of financial issues also that limit his diabetes control patient ready to see the endocrinologist to further evaluate his diabetes and find optimal care. Blood pressure elevated patient had this elevated for some time has taken meds in the past and stopped when his weight was down significantly but now is gotten worse.  Ready to go back on medications. Taking cholesterol medicines without problems.  Relevant past medical, surgical, family and social history reviewed and updated as indicated. Interim medical history since our last visit reviewed. Allergies and medications reviewed and updated.  Review of Systems  Constitutional: Negative.   Respiratory: Negative.   Cardiovascular: Negative.     Per HPI unless specifically indicated above     Objective:    BP (!) 142/91   Pulse 86   Temp 98.5 F (36.9 C) (Oral)   Ht 5\' 8"  (1.727 m)   Wt 219 lb 12.8 oz (99.7 kg)   SpO2 96%   BMI 33.42 kg/m   Wt Readings from Last 3 Encounters:  08/03/18 219 lb 12.8 oz (99.7 kg)  06/23/18 214 lb 1.6 oz (97.1 kg)  06/19/18 223 lb (101.2 kg)    Physical Exam  Constitutional: He is oriented to person, place, and time. He appears well-developed and well-nourished.  HENT:  Head: Normocephalic and atraumatic.  Eyes: Conjunctivae and EOM are normal.  Neck: Normal range of motion.  Cardiovascular: Normal rate, regular rhythm and  normal heart sounds.  Pulmonary/Chest: Effort normal and breath sounds normal.  Musculoskeletal: Normal range of motion.  Neurological: He is alert and oriented to person, place, and time.  Skin: No erythema.  Multiple lesions on his arms followed by dermatology  Psychiatric: He has a normal mood and affect. His behavior is normal. Judgment and thought content normal.    Results for orders placed or performed in visit on 04/27/18  Bayer DCA Hb A1c Waived  Result Value Ref Range   HB A1C (BAYER DCA - WAIVED) 10.0 (H) <7.0 %  Microalbumin, Urine Waived  Result Value Ref Range   Microalb, Ur Waived 80 (H) 0 - 19 mg/L   Creatinine, Urine Waived 100 10 - 300 mg/dL   Microalb/Creat Ratio <30 <30 mg/g  Comprehensive metabolic panel  Result Value Ref Range   Glucose 314 (H) 65 - 99 mg/dL   BUN 10 8 - 27 mg/dL   Creatinine, Ser 0.87 0.76 - 1.27 mg/dL   GFR calc non Af Amer 93 >59 mL/min/1.73   GFR calc Af Amer 108 >59 mL/min/1.73   BUN/Creatinine Ratio 11 10 - 24   Sodium 137 134 - 144 mmol/L   Potassium 4.4 3.5 - 5.2 mmol/L   Chloride 97 96 - 106 mmol/L  CO2 22 20 - 29 mmol/L   Calcium 9.5 8.6 - 10.2 mg/dL   Total Protein 7.4 6.0 - 8.5 g/dL   Albumin 4.6 3.6 - 4.8 g/dL   Globulin, Total 2.8 1.5 - 4.5 g/dL   Albumin/Globulin Ratio 1.6 1.2 - 2.2   Bilirubin Total 0.6 0.0 - 1.2 mg/dL   Alkaline Phosphatase 96 39 - 117 IU/L   AST 25 0 - 40 IU/L   ALT 25 0 - 44 IU/L  Lipid panel  Result Value Ref Range   Cholesterol, Total 142 100 - 199 mg/dL   Triglycerides 143 0 - 149 mg/dL   HDL 40 >39 mg/dL   VLDL Cholesterol Cal 29 5 - 40 mg/dL   LDL Calculated 73 0 - 99 mg/dL   Chol/HDL Ratio 3.6 0.0 - 5.0 ratio  CBC with Differential/Platelet  Result Value Ref Range   WBC 9.2 3.4 - 10.8 x10E3/uL   RBC 4.95 4.14 - 5.80 x10E6/uL   Hemoglobin 13.6 13.0 - 17.7 g/dL   Hematocrit 42.3 37.5 - 51.0 %   MCV 86 79 - 97 fL   MCH 27.5 26.6 - 33.0 pg   MCHC 32.2 31.5 - 35.7 g/dL   RDW 13.1 12.3  - 15.4 %   Platelets 198 150 - 450 x10E3/uL   Neutrophils 53 Not Estab. %   Lymphs 32 Not Estab. %   Monocytes 7 Not Estab. %   Eos 8 Not Estab. %   Basos 0 Not Estab. %   Neutrophils Absolute 5.0 1.4 - 7.0 x10E3/uL   Lymphocytes Absolute 2.9 0.7 - 3.1 x10E3/uL   Monocytes Absolute 0.6 0.1 - 0.9 x10E3/uL   EOS (ABSOLUTE) 0.7 (H) 0.0 - 0.4 x10E3/uL   Basophils Absolute 0.0 0.0 - 0.2 x10E3/uL   Immature Granulocytes 0 Not Estab. %   Immature Grans (Abs) 0.0 0.0 - 0.1 x10E3/uL  TSH  Result Value Ref Range   TSH 3.190 0.450 - 4.500 uIU/mL  Urinalysis, Routine w reflex microscopic  Result Value Ref Range   Specific Gravity, UA 1.010 1.005 - 1.030   pH, UA 5.5 5.0 - 7.5   Color, UA Yellow Yellow   Appearance Ur Clear Clear   Leukocytes, UA Negative Negative   Protein, UA Negative Negative/Trace   Glucose, UA 2+ (A) Negative   Ketones, UA Negative Negative   RBC, UA Negative Negative   Bilirubin, UA Negative Negative   Urobilinogen, Ur 0.2 0.2 - 1.0 mg/dL   Nitrite, UA Negative Negative  PSA  Result Value Ref Range   Prostate Specific Ag, Serum 0.5 0.0 - 4.0 ng/mL      Assessment & Plan:   Problem List Items Addressed This Visit      Cardiovascular and Mediastinum   Essential hypertension    Restart benazepril 20mg          Respiratory   COPD (chronic obstructive pulmonary disease) (HCC)    Stable no breathing issues        Endocrine   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

## 2018-08-03 NOTE — Assessment & Plan Note (Signed)
Poor control will refer to endo

## 2018-08-03 NOTE — Assessment & Plan Note (Signed)
Restart benazepril 20mg 

## 2018-08-03 NOTE — Assessment & Plan Note (Signed)
Stable no breathing issues

## 2018-08-08 DIAGNOSIS — M179 Osteoarthritis of knee, unspecified: Secondary | ICD-10-CM | POA: Diagnosis not present

## 2018-08-08 DIAGNOSIS — G629 Polyneuropathy, unspecified: Secondary | ICD-10-CM | POA: Diagnosis not present

## 2018-08-08 DIAGNOSIS — E11610 Type 2 diabetes mellitus with diabetic neuropathic arthropathy: Secondary | ICD-10-CM | POA: Diagnosis not present

## 2018-08-08 DIAGNOSIS — Z5181 Encounter for therapeutic drug level monitoring: Secondary | ICD-10-CM | POA: Diagnosis not present

## 2018-08-15 DIAGNOSIS — G629 Polyneuropathy, unspecified: Secondary | ICD-10-CM | POA: Diagnosis not present

## 2018-08-15 DIAGNOSIS — E11610 Type 2 diabetes mellitus with diabetic neuropathic arthropathy: Secondary | ICD-10-CM | POA: Diagnosis not present

## 2018-08-15 DIAGNOSIS — M179 Osteoarthritis of knee, unspecified: Secondary | ICD-10-CM | POA: Diagnosis not present

## 2018-08-15 DIAGNOSIS — Z5181 Encounter for therapeutic drug level monitoring: Secondary | ICD-10-CM | POA: Diagnosis not present

## 2018-08-19 DIAGNOSIS — E119 Type 2 diabetes mellitus without complications: Secondary | ICD-10-CM | POA: Diagnosis not present

## 2018-08-19 LAB — HM DIABETES EYE EXAM

## 2018-08-24 DIAGNOSIS — Z5181 Encounter for therapeutic drug level monitoring: Secondary | ICD-10-CM | POA: Diagnosis not present

## 2018-08-24 DIAGNOSIS — G629 Polyneuropathy, unspecified: Secondary | ICD-10-CM | POA: Diagnosis not present

## 2018-08-24 DIAGNOSIS — M179 Osteoarthritis of knee, unspecified: Secondary | ICD-10-CM | POA: Diagnosis not present

## 2018-08-24 DIAGNOSIS — E11610 Type 2 diabetes mellitus with diabetic neuropathic arthropathy: Secondary | ICD-10-CM | POA: Diagnosis not present

## 2018-08-29 ENCOUNTER — Other Ambulatory Visit: Payer: Self-pay | Admitting: Family Medicine

## 2018-08-29 DIAGNOSIS — G4733 Obstructive sleep apnea (adult) (pediatric): Secondary | ICD-10-CM | POA: Diagnosis not present

## 2018-08-29 NOTE — Telephone Encounter (Signed)
Requested medication (s) are due for refill today: yes  Requested medication (s) are on the active medication list: yes    Last refill: 06/28/18  Future visit scheduled yes 09/07/18  Notes to clinic:not delegated  Requested Prescriptions  Pending Prescriptions Disp Refills   pregabalin (LYRICA) 50 MG capsule [Pharmacy Med Name: PREGABALIN 50MG  CAP] 90 capsule 1    Sig: TAKE 1 CAPSULE BY MOUTH THREE TIMES DAILY     Not Delegated - Neurology:  Anticonvulsants - Controlled Failed - 08/29/2018 12:25 PM      Failed - This refill cannot be delegated      Passed - Valid encounter within last 12 months    Recent Outpatient Visits          3 weeks ago DM type 2 with diabetic peripheral neuropathy ([place])   [facility], Jeannette How, MD   2 months ago Acute pain of left knee   [place], [place], [place]   2 months ago Acute pain of left knee   [facility], [place], [place]   4 months ago DM type 2 with diabetic peripheral neuropathy [facility])   [facility] Cr[facility], Jeannette How, MD   6 months ago DM type 2 with diabetic peripheral neuropathy [facility])   [place], Jeannette How, MD      Future Appointments            In 1 week Crissman, Jeannette How, MD [place], [facility]   In 8 months  [facility], [place]

## 2018-09-07 ENCOUNTER — Ambulatory Visit: Payer: Medicare Other | Admitting: Family Medicine

## 2018-09-13 DIAGNOSIS — G4733 Obstructive sleep apnea (adult) (pediatric): Secondary | ICD-10-CM | POA: Diagnosis not present

## 2018-09-20 ENCOUNTER — Ambulatory Visit (INDEPENDENT_AMBULATORY_CARE_PROVIDER_SITE_OTHER): Payer: Medicare Other | Admitting: Family Medicine

## 2018-09-20 ENCOUNTER — Encounter: Payer: Self-pay | Admitting: Family Medicine

## 2018-09-20 VITALS — BP 119/76 | HR 82 | Temp 97.8°F | Wt 228.4 lb

## 2018-09-20 DIAGNOSIS — I10 Essential (primary) hypertension: Secondary | ICD-10-CM | POA: Diagnosis not present

## 2018-09-20 NOTE — Progress Notes (Signed)
   BP 119/76   Pulse 82   Temp 97.8 F (36.6 C) (Oral)   Wt 228 lb 6.4 oz (103.6 kg)   SpO2 97%   BMI 34.73 kg/m    Subjective:    Patient ID: Edward Flesher., male    DOB: 12-May-1956, 62 y.o.   MRN: [id number]  HPI: Edward Schneider. is a 62 y.o. male  Chief Complaint  Patient presents with  . Hypertension    1 month f/up  Patient all in all doing well is back on benazepril and having good control of blood pressure.  Patient feeling well with no problems at all. Diabetes still elevated occasional readings in the 160s but mostly in the low 200s.  No issues with medications no low blood sugar spells. Unfortunately had to cancel his appointment with endocrinology due to the death of his mother-in-law.  He is rescheduled for next week. Patient's medicines otherwise doing well with no complaints cholesterol etc.  Relevant past medical, surgical, family and social history reviewed and updated as indicated. Interim medical history since our last visit reviewed. Allergies and medications reviewed and updated.  Review of Systems  Constitutional: Negative.   Respiratory: Negative.   Cardiovascular: Negative.     Per HPI unless specifically indicated above     Objective:    BP 119/76   Pulse 82   Temp 97.8 F (36.6 C) (Oral)   Wt 228 lb 6.4 oz (103.6 kg)   SpO2 97%   BMI 34.73 kg/m   Wt Readings from Last 3 Encounters:  09/20/18 228 lb 6.4 oz (103.6 kg)  08/03/18 219 lb 12.8 oz (99.7 kg)  06/23/18 214 lb 1.6 oz (97.1 kg)    Physical Exam  Constitutional: He is oriented to person, place, and time. He appears well-developed and well-nourished.  HENT:  Head: Normocephalic and atraumatic.  Eyes: Conjunctivae and EOM are normal.  Neck: Normal range of motion.  Cardiovascular: Normal rate, regular rhythm and normal heart sounds.  Pulmonary/Chest: Effort normal and breath sounds normal.  Musculoskeletal: Normal range of motion.  Neurological: He is alert and oriented to  person, place, and time.  Skin: No erythema.  Psychiatric: He has a normal mood and affect. His behavior is normal. Judgment and thought content normal.    Results for orders placed or performed in visit on 08/26/18  HM DIABETES EYE EXAM  Result Value Ref Range   HM Diabetic Eye Exam No Retinopathy No Retinopathy      Assessment & Plan:   Problem List Items Addressed This Visit      Cardiovascular and Mediastinum   Essential hypertension - Primary   Relevant Orders   Basic metabolic panel       Follow up plan: Return in about 6 months (around 03/22/2019) for Physical Exam.

## 2018-09-21 ENCOUNTER — Encounter: Payer: Self-pay | Admitting: Family Medicine

## 2018-09-21 LAB — BASIC METABOLIC PANEL
BUN/Creatinine Ratio: 10 (ref 10–24)
BUN: 10 mg/dL (ref 8–27)
CALCIUM: 10 mg/dL (ref 8.6–10.2)
CHLORIDE: 96 mmol/L (ref 96–106)
CO2: 25 mmol/L (ref 20–29)
Creatinine, Ser: 1.05 mg/dL (ref 0.76–1.27)
GFR calc Af Amer: 88 mL/min/{1.73_m2} (ref >59)
GFR calc non Af Amer: 76 mL/min/{1.73_m2} (ref >59)
Glucose: 298 mg/dL — ABNORMAL HIGH (ref 65–99)
POTASSIUM: 4.9 mmol/L (ref 3.5–5.2)
Sodium: 135 mmol/L (ref 134–144)

## 2018-09-28 DIAGNOSIS — G4733 Obstructive sleep apnea (adult) (pediatric): Secondary | ICD-10-CM | POA: Diagnosis not present

## 2018-10-03 DIAGNOSIS — Z5181 Encounter for therapeutic drug level monitoring: Secondary | ICD-10-CM | POA: Diagnosis not present

## 2018-10-03 DIAGNOSIS — M179 Osteoarthritis of knee, unspecified: Secondary | ICD-10-CM | POA: Diagnosis not present

## 2018-10-03 DIAGNOSIS — E11610 Type 2 diabetes mellitus with diabetic neuropathic arthropathy: Secondary | ICD-10-CM | POA: Diagnosis not present

## 2018-10-03 DIAGNOSIS — G629 Polyneuropathy, unspecified: Secondary | ICD-10-CM | POA: Diagnosis not present

## 2018-10-24 ENCOUNTER — Other Ambulatory Visit: Payer: Self-pay | Admitting: Family Medicine

## 2018-10-29 DIAGNOSIS — G4733 Obstructive sleep apnea (adult) (pediatric): Secondary | ICD-10-CM | POA: Diagnosis not present

## 2018-10-31 DIAGNOSIS — E11610 Type 2 diabetes mellitus with diabetic neuropathic arthropathy: Secondary | ICD-10-CM | POA: Diagnosis not present

## 2018-10-31 DIAGNOSIS — G629 Polyneuropathy, unspecified: Secondary | ICD-10-CM | POA: Diagnosis not present

## 2018-10-31 DIAGNOSIS — M179 Osteoarthritis of knee, unspecified: Secondary | ICD-10-CM | POA: Diagnosis not present

## 2018-10-31 DIAGNOSIS — Z5181 Encounter for therapeutic drug level monitoring: Secondary | ICD-10-CM | POA: Diagnosis not present

## 2018-11-23 DIAGNOSIS — L281 Prurigo nodularis: Secondary | ICD-10-CM | POA: Diagnosis not present

## 2018-11-28 ENCOUNTER — Other Ambulatory Visit: Payer: Self-pay | Admitting: Family Medicine

## 2018-11-28 DIAGNOSIS — E11610 Type 2 diabetes mellitus with diabetic neuropathic arthropathy: Secondary | ICD-10-CM | POA: Diagnosis not present

## 2018-11-28 DIAGNOSIS — M48062 Spinal stenosis, lumbar region with neurogenic claudication: Secondary | ICD-10-CM | POA: Diagnosis not present

## 2018-11-28 DIAGNOSIS — M4807 Spinal stenosis, lumbosacral region: Secondary | ICD-10-CM | POA: Diagnosis not present

## 2018-11-28 DIAGNOSIS — G894 Chronic pain syndrome: Secondary | ICD-10-CM | POA: Diagnosis not present

## 2018-11-28 DIAGNOSIS — G8929 Other chronic pain: Secondary | ICD-10-CM | POA: Diagnosis not present

## 2018-11-28 DIAGNOSIS — M792 Neuralgia and neuritis, unspecified: Secondary | ICD-10-CM | POA: Diagnosis not present

## 2018-11-28 DIAGNOSIS — M47897 Other spondylosis, lumbosacral region: Secondary | ICD-10-CM | POA: Diagnosis not present

## 2018-11-28 DIAGNOSIS — G629 Polyneuropathy, unspecified: Secondary | ICD-10-CM | POA: Diagnosis not present

## 2018-11-29 DIAGNOSIS — G4733 Obstructive sleep apnea (adult) (pediatric): Secondary | ICD-10-CM | POA: Diagnosis not present

## 2018-12-07 DIAGNOSIS — L281 Prurigo nodularis: Secondary | ICD-10-CM | POA: Diagnosis not present

## 2018-12-14 DIAGNOSIS — G4733 Obstructive sleep apnea (adult) (pediatric): Secondary | ICD-10-CM | POA: Diagnosis not present

## 2018-12-26 DIAGNOSIS — G894 Chronic pain syndrome: Secondary | ICD-10-CM | POA: Diagnosis not present

## 2018-12-28 DIAGNOSIS — G4733 Obstructive sleep apnea (adult) (pediatric): Secondary | ICD-10-CM | POA: Diagnosis not present

## 2019-01-06 DIAGNOSIS — R55 Syncope and collapse: Secondary | ICD-10-CM | POA: Diagnosis not present

## 2019-01-13 ENCOUNTER — Observation Stay
Admission: EM | Admit: 2019-01-13 | Discharge: 2019-01-15 | Disposition: A | Discharge status: Home / Self Care | Payer: Medicare Other | Attending: Internal Medicine | Admitting: Internal Medicine

## 2019-01-13 ENCOUNTER — Emergency Department: Payer: Medicare Other

## 2019-01-13 ENCOUNTER — Other Ambulatory Visit: Payer: Self-pay

## 2019-01-13 DIAGNOSIS — Z8505 Personal history of malignant neoplasm of liver: Secondary | ICD-10-CM | POA: Diagnosis not present

## 2019-01-13 DIAGNOSIS — M5136 Other intervertebral disc degeneration, lumbar region: Secondary | ICD-10-CM | POA: Insufficient documentation

## 2019-01-13 DIAGNOSIS — E1142 Type 2 diabetes mellitus with diabetic polyneuropathy: Secondary | ICD-10-CM | POA: Diagnosis not present

## 2019-01-13 DIAGNOSIS — Z885 Allergy status to narcotic agent status: Secondary | ICD-10-CM | POA: Insufficient documentation

## 2019-01-13 DIAGNOSIS — Z7982 Long term (current) use of aspirin: Secondary | ICD-10-CM | POA: Insufficient documentation

## 2019-01-13 DIAGNOSIS — R296 Repeated falls: Secondary | ICD-10-CM | POA: Diagnosis not present

## 2019-01-13 DIAGNOSIS — K219 Gastro-esophageal reflux disease without esophagitis: Secondary | ICD-10-CM | POA: Insufficient documentation

## 2019-01-13 DIAGNOSIS — I10 Essential (primary) hypertension: Secondary | ICD-10-CM | POA: Diagnosis not present

## 2019-01-13 DIAGNOSIS — Z888 Allergy status to other drugs, medicaments and biological substances status: Secondary | ICD-10-CM | POA: Diagnosis not present

## 2019-01-13 DIAGNOSIS — R079 Chest pain, unspecified: Secondary | ICD-10-CM | POA: Diagnosis not present

## 2019-01-13 DIAGNOSIS — R55 Syncope and collapse: Secondary | ICD-10-CM | POA: Diagnosis not present

## 2019-01-13 DIAGNOSIS — I2510 Atherosclerotic heart disease of native coronary artery without angina pectoris: Secondary | ICD-10-CM | POA: Insufficient documentation

## 2019-01-13 DIAGNOSIS — Z8673 Personal history of transient ischemic attack (TIA), and cerebral infarction without residual deficits: Secondary | ICD-10-CM | POA: Insufficient documentation

## 2019-01-13 DIAGNOSIS — R404 Transient alteration of awareness: Secondary | ICD-10-CM | POA: Diagnosis not present

## 2019-01-13 DIAGNOSIS — I252 Old myocardial infarction: Secondary | ICD-10-CM | POA: Insufficient documentation

## 2019-01-13 DIAGNOSIS — G8929 Other chronic pain: Secondary | ICD-10-CM | POA: Insufficient documentation

## 2019-01-13 DIAGNOSIS — R0902 Hypoxemia: Secondary | ICD-10-CM | POA: Diagnosis not present

## 2019-01-13 DIAGNOSIS — N179 Acute kidney failure, unspecified: Secondary | ICD-10-CM | POA: Diagnosis not present

## 2019-01-13 DIAGNOSIS — G4733 Obstructive sleep apnea (adult) (pediatric): Secondary | ICD-10-CM | POA: Diagnosis not present

## 2019-01-13 DIAGNOSIS — Z7984 Long term (current) use of oral hypoglycemic drugs: Secondary | ICD-10-CM | POA: Insufficient documentation

## 2019-01-13 DIAGNOSIS — I959 Hypotension, unspecified: Secondary | ICD-10-CM | POA: Insufficient documentation

## 2019-01-13 DIAGNOSIS — Z791 Long term (current) use of non-steroidal anti-inflammatories (NSAID): Secondary | ICD-10-CM | POA: Diagnosis not present

## 2019-01-13 DIAGNOSIS — E1165 Type 2 diabetes mellitus with hyperglycemia: Secondary | ICD-10-CM | POA: Diagnosis not present

## 2019-01-13 DIAGNOSIS — Z79899 Other long term (current) drug therapy: Secondary | ICD-10-CM | POA: Diagnosis not present

## 2019-01-13 LAB — CBC
HCT: 35.5 % — ABNORMAL LOW (ref 39.0–52.0)
Hemoglobin: 11.6 g/dL — ABNORMAL LOW (ref 13.0–17.0)
MCH: 28.6 pg (ref 26.0–34.0)
MCHC: 32.7 g/dL (ref 30.0–36.0)
MCV: 87.4 fL (ref 80.0–100.0)
Platelets: 203 10*3/uL (ref 150–400)
RBC: 4.06 MIL/uL — ABNORMAL LOW (ref 4.22–5.81)
RDW: 13 % (ref 11.5–15.5)
WBC: 13 10*3/uL — ABNORMAL HIGH (ref 4.0–10.5)
nRBC: 0 % (ref 0.0–0.2)

## 2019-01-13 LAB — COMPREHENSIVE METABOLIC PANEL
ALT: 20 U/L (ref 0–44)
AST: 21 U/L (ref 15–41)
Albumin: 1.8 g/dL — ABNORMAL LOW (ref 3.5–5.0)
Alkaline Phosphatase: 32 U/L — ABNORMAL LOW (ref 38–126)
Anion gap: 4 — ABNORMAL LOW (ref 5–15)
BUN: 12 mg/dL (ref 8–23)
CO2: 13 mmol/L — ABNORMAL LOW (ref 22–32)
Calcium: 4.7 mg/dL — CL (ref 8.9–10.3)
Chloride: 125 mmol/L — ABNORMAL HIGH (ref 98–111)
Creatinine, Ser: 1.29 mg/dL — ABNORMAL HIGH (ref 0.61–1.24)
GFR calc Af Amer: >60 mL/min (ref >60)
GFR calc non Af Amer: 59 mL/min — ABNORMAL LOW (ref >60)
Glucose, Bld: 227 mg/dL — ABNORMAL HIGH (ref 70–99)
Potassium: 2.2 mmol/L — CL (ref 3.5–5.1)
Sodium: 142 mmol/L (ref 135–145)
Total Bilirubin: 0.4 mg/dL (ref 0.3–1.2)
Total Protein: 3.1 g/dL — ABNORMAL LOW (ref 6.5–8.1)

## 2019-01-13 LAB — TROPONIN I: Troponin I: <0.03 ng/mL (ref <0.03)

## 2019-01-13 LAB — GLUCOSE, CAPILLARY: GLUCOSE-CAPILLARY: 450 mg/dL — AB (ref 70–99)

## 2019-01-13 MED ORDER — SODIUM CHLORIDE 0.9 % IV BOLUS
1000.0000 mL | Freq: Once | INTRAVENOUS | Status: AC
  Administered 2019-01-13: 1000 mL via INTRAVENOUS

## 2019-01-13 MED ORDER — IOHEXOL 350 MG/ML SOLN
75.0000 mL | Freq: Once | INTRAVENOUS | Status: AC | PRN
  Administered 2019-01-13: 75 mL via INTRAVENOUS

## 2019-01-13 MED ORDER — SODIUM CHLORIDE 0.9 % IV BOLUS
1000.0000 mL | Freq: Once | INTRAVENOUS | Status: AC
  Administered 2019-01-14: 1000 mL via INTRAVENOUS

## 2019-01-13 NOTE — ED Notes (Signed)
Pt reports his ,spouse has been sick at home with a bad cold and been seen by her pcp many times over  the last 3 weeks .

## 2019-01-13 NOTE — ED Triage Notes (Addendum)
Pt to ED via ems from home. Pt states he had 3 syncopal episodes this afternoon, last one at 1750. Pt arrives c/o chest pain that's new today. Pt states when he had last syncopal episode he fell into recliner, does not know if he hit his head. Pt arrives disoriented to place and groggy. CBG 450 on arrival. Pt was given 324 asprin by EMS.

## 2019-01-13 NOTE — ED Provider Notes (Signed)
[facility] Emergency Department Provider Note  Time seen: 9:30 PM  I have reviewed the triage vital signs and the nursing notes.   HISTORY  Chief Complaint Loss of Consciousness and Chest Pain   HPI Edward Schneider. is a 63 y.o. male with a past medical history of allergies, asthma, Seizure disorder, mini stroke, presents to the emergency department with reported chest pain, multiple syncopal events.  According to EMS around 5 PM today patient developed chest pain, he states that is new today, had 2 or 3 syncopal episodes per patient.  States he fell onto his recliner does not know if he hit his head.  Patient is somewhat disoriented at first however is now able to tell me where he is, the year, and most of the events at home.  Does state he was outside working for the majority of the day.  States his blood sugars have been in the 2 and 300s however upon arrival is 450.  States he only takes metformin for his diabetes.  Patient noted to be hypotensive by EMS 90 systolic, 73 systolic upon standing.   Past Medical History:  Diagnosis Date  . Allergy   . Asthma   . C. difficile diarrhea   . Cancer [facility]) June 2016   liver cancer  . Chronic pain   . DDD (degenerative disc disease), cervical   . DDD (degenerative disc disease), lumbar   . Diabetes mellitus without complication ([place])   . GERD (gastroesophageal reflux disease)   . Headache    migraines - none since 02/17  . Hypertension   . Low blood sugar   . Myocardial infarction ([place])   . Seizures ([place])    several as child when sick.  None since age 54  . Stroke [facility])    'mini-stroke" 30 yrs ago. no deficits.  . Wears dentures    full upper and lower    Patient Active Problem List   Diagnosis Date Noted  . BPH (benign prostatic hyperplasia) 04/27/2018  . Laceration of arm 04/27/2018  . Knee pain, right 02/08/2018  . Sleep apnea 02/08/2018  . Other chest pain   . GERD (gastroesophageal reflux  disease) 04/04/2017  . Advanced care planning/counseling discussion 03/29/2017  . DM type 2 with diabetic peripheral neuropathy ([place]) 09/30/2016  . Skin lesions, generalized 09/24/2016  . Insomnia 12/24/2015  . Allergy to bee sting 12/12/2015  . Hypercholesterolemia 09/23/2015  . Carotid artery narrowing 09/11/2015  . Atherosclerosis of abdominal aorta ([place]) 08/26/2015  . Migraine headache 08/21/2015  . Hereditary and idiopathic peripheral neuropathy 05/22/2015  . COPD (chronic obstructive pulmonary disease) ([place]) 04/25/2015  . Essential hypertension 04/25/2015  . DDD (degenerative disc disease), cervical 03/27/2015  . DDD (degenerative disc disease), lumbar 03/27/2015  . Cervical post-laminectomy syndrome 03/27/2015  . Bilateral occipital neuralgia 03/27/2015  . Pancreatic insufficiency 01/28/2015  . Back pain, chronic 12/24/2014  . 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 APPENDECTOMY    . BACK SURGERY    . CARDIAC CATHETERIZATION     stent placed in his "30's"  . COLONOSCOPY WITH PROPOFOL N/A 03/06/2016   Procedure: COLONOSCOPY WITH PROPOFOL;  Surgeon: Lucilla Lame, MD;  Location: [place];  Service: Endoscopy;  Laterality: N/A;  requests early  . ESOPHAGOGASTRODUODENOSCOPY (EGD) WITH PROPOFOL N/A 09/20/2017   Procedure: ESOPHAGOGASTRODUODENOSCOPY (EGD) WITH PROPOFOL;  Surgeon: Lucilla Lame, MD;  Location: [place];  Service: Endoscopy;  Laterality:  N/A;  Diabetic - oral meds  . FINGER SURGERY Left   . KNEE SURGERY Right   . NECK SURGERY    . spleen surg    . spleen surgery    . TOE SURGERY Right     Prior to Admission medications   Medication Sig Start Date End Date Taking? Authorizing Provider  ACCU-CHEK AVIVA PLUS test strip USE TEST STRIP(S) TO CHECK GLUCOSE ONCE DAILY 06/30/17   Guadalupe Maple, MD  ACCU-CHEK SOFTCLIX LANCETS lancets USE TO CHECK GLUCOSE ONCE DAILY 06/30/17   Guadalupe Maple, MD  aspirin EC 81 MG tablet Take 162 mg by mouth daily.     [provider]  atorvastatin (LIPITOR) 20 MG tablet Take 1 tablet (20 mg total) by mouth daily. 04/27/18   Guadalupe Maple, MD  benazepril (LOTENSIN) 20 MG tablet Take 1 tablet (20 mg total) by mouth daily. 08/03/18   Guadalupe Maple, MD  Blood Glucose Monitoring Suppl (ACCU-CHEK AVIVA PLUS) w/Device KIT USE   TO CHECK GLUCOSE ONCE DAILY 07/25/18   Volney American, PA-C  CINNAMON PO Take 2,400 mg by mouth daily.    [provider]  cyclobenzaprine (FLEXERIL) 10 MG tablet Take 1 tablet (10 mg total) by mouth 3 (three) times daily as needed for muscle spasms. 01/07/18   Lucilla Lame, MD  diclofenac sodium (VOLTAREN) 1 % GEL Apply 2 g topically 4 (four) times daily. 06/23/18   Volney American, PA-C  diphenhydrAMINE (BENADRYL) 25 mg capsule Take 25 mg by mouth 2 (two) times daily.     [provider]  diphenoxylate-atropine (LOMOTIL) 2.5-0.025 MG tablet Take 1 tablet by mouth 4 (four) times daily as needed for diarrhea or loose stools. 09/29/17   Lucilla Lame, MD  DULoxetine (CYMBALTA) 60 MG capsule Take 1 capsule (60 mg total) by mouth daily. 04/27/18   Guadalupe Maple, MD  Ginseng 100 MG CAPS Take by mouth.    [provider]  metFORMIN (GLUCOPHAGE) 500 MG tablet Take 2 tablets (1,000 mg total) by mouth 2 (two) times daily with a meal. 04/27/18   Crissman, Jeannette How, MD  Omega-3 1000 MG CAPS Take by mouth.    [provider]  oxyCODONE (ROXICODONE) 15 MG immediate release tablet TAKE 1 2 TO 1 (ONE HALF TO ONE) TABLET BY MOUTH FOUR TO SIX TIMES DAILY IF TOLERTED NOTE TABLET IS 15 MG 08/24/18   [provider]  pantoprazole (PROTONIX) 40 MG tablet Take 1 tablet (40 mg total) by mouth 2 (two) times daily. 04/27/18   Guadalupe Maple, MD  pregabalin (LYRICA) 50 MG capsule TAKE 1 CAPSULE BY MOUTH THREE TIMES DAILY 11/29/18   Guadalupe Maple, MD  sitaGLIPtin (JANUVIA) 100 MG tablet Take  1 tablet (100 mg total) by mouth daily. 08/03/18   Guadalupe Maple, MD  sucralfate (CARAFATE) 1 g tablet Take 1 tablet (1 g total) by mouth 4 (four) times daily -  with meals and at bedtime. 04/27/18   Guadalupe Maple, MD  traZODone (DESYREL) 100 MG tablet Take 1 tablet (100 mg total) by mouth at bedtime as needed for sleep. 04/27/18   Guadalupe Maple, MD  triamcinolone cream (KENALOG) 0.1 % Apply 1 application topically 2 (two) times daily. 05/30/18   Guadalupe Maple, MD  vitamin B-12 (CYANOCOBALAMIN) 1000 MCG tablet Take 1,000 mcg by mouth daily.    [provider]    Allergies  Allergen Reactions  . Bee Pollen Anaphylaxis  Died 3 times when stung by bees. Carries Epi-pen at all times. Died 3 times when stung by bees. Carries Epi-pen at all times.  . Bee Venom Anaphylaxis  . Crestor [Rosuvastatin Calcium] Shortness Of Breath and Swelling  . Fentanyl Itching and Hives    blisters Patch  . Shellfish Allergy Anaphylaxis and Swelling    Shrimp causes throat to swell and tingling in tongue. Can eat other white fish, crabcakes, and oysters. Shrimp causes throat to swell and tingling in tongue. Can eat other white fish, crabcakes, and oysters.  . Gabapentin Diarrhea    Severe diarrhea which caused incontinence, loss of appetite and weight loss.  Severe diarrhea which caused incontinence, loss of appetite and weight loss.  Severe diarrhea which caused incontinence, loss of appetite and weight loss.  Severe diarrhea which caused incontinence, loss of appetite and weight loss.   . Morphine Itching  . Morphine And Related Itching  . Simvastatin Diarrhea  . Buprenorphine Hcl Itching    Family History  Problem Relation Age of Onset  . Arthritis Mother   . Diabetes Mother   . Kidney disease Mother   . Heart disease Mother   . Hypertension Mother   . Arthritis Father   . Hearing loss Father   . Hypertension Father   . Diabetes Sister   . Heart disease Sister   . Diabetes  Daughter   . Diabetes Maternal Aunt   . Diabetes Maternal Grandmother     Social History Social History   Tobacco Use  . Smoking status: Former Smoker    Packs/day: 2.00    Years: 50.00    Pack years: 100.00    Types: Cigarettes    Last attempt to quit: 07/04/2012    Years since quitting: 6.5  . Smokeless tobacco: Never Used  Substance Use Topics  . Alcohol use: No    Alcohol/week: 0.0 standard drinks  . Drug use: No    Review of Systems Constitutional: Negative for fever. Cardiovascular: Positive for chest pain that started before the fall per patient. Respiratory: Negative for shortness of breath. Gastrointestinal: Negative for abdominal pain, vomiting Genitourinary: Negative for urinary compaints Musculoskeletal: Negative for musculoskeletal complaints Skin: Negative for skin complaints  Neurological: Negative for headache All other ROS negative  ____________________________________________   PHYSICAL EXAM:  Constitutional: Alert and oriented. Well appearing and in no distress. Eyes: Normal exam ENT   Head: Normocephalic and atraumatic   Mouth/Throat: Mucous membranes are moist. Cardiovascular: Normal rate, regular rhythm. No murmur Respiratory: Normal respiratory effort without tachypnea nor retractions. Breath sounds are clear Gastrointestinal: Soft and nontender. No distention.   Musculoskeletal: Nontender with normal range of motion in all extremities. Neurologic:  Normal speech and language. No gross focal neurologic deficits  Skin:  Skin is warm, dry and intact.  Psychiatric: Mood and affect are normal.   ____________________________________________    EKG  EKG viewed and interpreted by myself shows sinus rhythm 89 bpm with a narrow QRS, normal axis, normal intervals, no concerning ST changes.  ____________________________________________    NIOEVOJJK  CT pending  ____________________________________________   INITIAL IMPRESSION /  ASSESSMENT AND PLAN / ED COURSE  Pertinent labs & imaging results that were available during my care of the patient were reviewed by me and considered in my medical decision making (see chart for details).  Patient presents to the emergency department for chest pain, syncope x3, found to be hypoglycemic and hypotensive.  Currently patient remains hypotensive 90/60.  We will start IV  hydration.  Differential at this time would include ACS, metabolic abnormality such as renal insufficiency, pulmonary embolism, dehydration/hypovolemia.  We will check labs, obtain an EKG, CT angiography of the chest and continue to closely monitor.  We will IV hydrate x2 L and reassess.  We will resend labs due to significant abnormalities.  Pt care signed out to Dr. Quentin Cornwall, repeat labs and CT pending.    ____________________________________________   FINAL CLINICAL IMPRESSION(S) / ED DIAGNOSES  Chest pain Syncope fall    Harvest Dark, MD 01/14/19 [phone]

## 2019-01-13 NOTE — ED Notes (Signed)
Glucose level at 450 mg/dL

## 2019-01-14 ENCOUNTER — Observation Stay: Payer: Medicare Other

## 2019-01-14 ENCOUNTER — Other Ambulatory Visit: Payer: Self-pay

## 2019-01-14 ENCOUNTER — Observation Stay
Admit: 2019-01-14 | Discharge: 2019-01-14 | Disposition: A | Discharge status: Home / Self Care | Payer: Medicare Other | Attending: Family Medicine | Admitting: Family Medicine

## 2019-01-14 DIAGNOSIS — R079 Chest pain, unspecified: Secondary | ICD-10-CM | POA: Diagnosis not present

## 2019-01-14 DIAGNOSIS — R51 Headache: Secondary | ICD-10-CM | POA: Diagnosis not present

## 2019-01-14 DIAGNOSIS — E1165 Type 2 diabetes mellitus with hyperglycemia: Secondary | ICD-10-CM | POA: Diagnosis not present

## 2019-01-14 DIAGNOSIS — R42 Dizziness and giddiness: Secondary | ICD-10-CM | POA: Diagnosis not present

## 2019-01-14 DIAGNOSIS — N179 Acute kidney failure, unspecified: Secondary | ICD-10-CM | POA: Diagnosis not present

## 2019-01-14 DIAGNOSIS — R55 Syncope and collapse: Secondary | ICD-10-CM | POA: Diagnosis not present

## 2019-01-14 DIAGNOSIS — R0602 Shortness of breath: Secondary | ICD-10-CM | POA: Diagnosis not present

## 2019-01-14 LAB — CBC
HCT: 33.6 % — ABNORMAL LOW (ref 39.0–52.0)
Hemoglobin: 10.7 g/dL — ABNORMAL LOW (ref 13.0–17.0)
MCH: 28.5 pg (ref 26.0–34.0)
MCHC: 31.8 g/dL (ref 30.0–36.0)
MCV: 89.6 fL (ref 80.0–100.0)
NRBC: 0 % (ref 0.0–0.2)
Platelets: 175 10*3/uL (ref 150–400)
RBC: 3.75 MIL/uL — ABNORMAL LOW (ref 4.22–5.81)
RDW: 13 % (ref 11.5–15.5)
WBC: 9.1 10*3/uL (ref 4.0–10.5)

## 2019-01-14 LAB — BASIC METABOLIC PANEL
ANION GAP: 6 (ref 5–15)
BUN: 20 mg/dL (ref 8–23)
CO2: 23 mmol/L (ref 22–32)
Calcium: 8.3 mg/dL — ABNORMAL LOW (ref 8.9–10.3)
Chloride: 107 mmol/L (ref 98–111)
Creatinine, Ser: 1.92 mg/dL — ABNORMAL HIGH (ref 0.61–1.24)
GFR calc Af Amer: 43 mL/min — ABNORMAL LOW (ref >60)
GFR calc non Af Amer: 37 mL/min — ABNORMAL LOW (ref >60)
Glucose, Bld: 263 mg/dL — ABNORMAL HIGH (ref 70–99)
Potassium: 4.2 mmol/L (ref 3.5–5.1)
Sodium: 136 mmol/L (ref 135–145)

## 2019-01-14 LAB — BASIC METABOLIC PANEL WITH GFR
Anion gap: 9 (ref 5–15)
BUN: 20 mg/dL (ref 8–23)
CO2: 23 mmol/L (ref 22–32)
Calcium: 8.8 mg/dL — ABNORMAL LOW (ref 8.9–10.3)
Chloride: 100 mmol/L (ref 98–111)
Creatinine, Ser: 2.39 mg/dL — ABNORMAL HIGH (ref 0.61–1.24)
GFR calc Af Amer: 33 mL/min — ABNORMAL LOW
GFR calc non Af Amer: 28 mL/min — ABNORMAL LOW
Glucose, Bld: 346 mg/dL — ABNORMAL HIGH (ref 70–99)
Potassium: 4.3 mmol/L (ref 3.5–5.1)
Sodium: 132 mmol/L — ABNORMAL LOW (ref 135–145)

## 2019-01-14 LAB — TROPONIN I: Troponin I: <0.03 ng/mL (ref <0.03)

## 2019-01-14 LAB — URINALYSIS, COMPLETE (UACMP) WITH MICROSCOPIC
Bacteria, UA: NONE SEEN
Bilirubin Urine: NEGATIVE
Glucose, UA: >=500 mg/dL — AB
Hgb urine dipstick: NEGATIVE
Ketones, ur: NEGATIVE mg/dL
Leukocytes,Ua: NEGATIVE
Nitrite: NEGATIVE
Protein, ur: NEGATIVE mg/dL
Specific Gravity, Urine: 1.018 (ref 1.005–1.030)
pH: 5 (ref 5.0–8.0)

## 2019-01-14 LAB — GLUCOSE, CAPILLARY
Glucose-Capillary: 227 mg/dL — ABNORMAL HIGH (ref 70–99)
Glucose-Capillary: 249 mg/dL — ABNORMAL HIGH (ref 70–99)
Glucose-Capillary: 294 mg/dL — ABNORMAL HIGH (ref 70–99)
Glucose-Capillary: 218 mg/dL — ABNORMAL HIGH (ref 70–99)

## 2019-01-14 LAB — ECHOCARDIOGRAM COMPLETE
Height: 68 in
Weight: 3616 oz

## 2019-01-14 LAB — CK: Total CK: 146 U/L (ref 49–397)

## 2019-01-14 MED ORDER — ATORVASTATIN CALCIUM 20 MG PO TABS
20.0000 mg | ORAL_TABLET | Freq: Every day | ORAL | Status: DC
  Administered 2019-01-14: 20 mg via ORAL
  Filled 2019-01-14: qty 1

## 2019-01-14 MED ORDER — MECLIZINE HCL 12.5 MG PO TABS
12.5000 mg | ORAL_TABLET | Freq: Three times a day (TID) | ORAL | Status: DC
  Administered 2019-01-14 – 2019-01-15 (×3): 12.5 mg via ORAL
  Filled 2019-01-14 (×6): qty 1

## 2019-01-14 MED ORDER — SUCRALFATE 1 G PO TABS
1.0000 g | ORAL_TABLET | Freq: Three times a day (TID) | ORAL | Status: DC
  Administered 2019-01-14 – 2019-01-15 (×5): 1 g via ORAL
  Filled 2019-01-14 (×5): qty 1

## 2019-01-14 MED ORDER — HYDROMORPHONE HCL 1 MG/ML IJ SOLN: 1.0000 mg | INTRAMUSCULAR | Status: DC | PRN

## 2019-01-14 MED ORDER — PANTOPRAZOLE SODIUM 40 MG PO TBEC
40.0000 mg | DELAYED_RELEASE_TABLET | Freq: Two times a day (BID) | ORAL | Status: DC
  Administered 2019-01-14 – 2019-01-15 (×3): 40 mg via ORAL
  Filled 2019-01-14 (×3): qty 1

## 2019-01-14 MED ORDER — ACETAMINOPHEN 650 MG RE SUPP: 650.0000 mg | Freq: Four times a day (QID) | RECTAL | Status: DC | PRN

## 2019-01-14 MED ORDER — ALUM & MAG HYDROXIDE-SIMETH 200-200-20 MG/5ML PO SUSP: 30.0000 mL | Freq: Four times a day (QID) | ORAL | Status: DC | PRN

## 2019-01-14 MED ORDER — VITAMIN B-12 1000 MCG PO TABS
1000.0000 ug | ORAL_TABLET | Freq: Every day | ORAL | Status: DC
  Administered 2019-01-14 – 2019-01-15 (×2): 1000 ug via ORAL
  Filled 2019-01-14 (×3): qty 1

## 2019-01-14 MED ORDER — SODIUM CHLORIDE 0.9 % IV SOLN
INTRAVENOUS | Status: DC
  Administered 2019-01-14 (×3): via INTRAVENOUS

## 2019-01-14 MED ORDER — TRAZODONE HCL 100 MG PO TABS
100.0000 mg | ORAL_TABLET | Freq: Every evening | ORAL | Status: DC | PRN
  Administered 2019-01-14: 100 mg via ORAL
  Filled 2019-01-14: qty 1

## 2019-01-14 MED ORDER — NITROGLYCERIN 0.4 MG SL SUBL: 0.4000 mg | SUBLINGUAL_TABLET | SUBLINGUAL | Status: DC | PRN

## 2019-01-14 MED ORDER — OXYCODONE HCL 5 MG PO TABS
15.0000 mg | ORAL_TABLET | Freq: Four times a day (QID) | ORAL | Status: DC | PRN
  Administered 2019-01-14 – 2019-01-15 (×4): 15 mg via ORAL
  Filled 2019-01-14 (×5): qty 3

## 2019-01-14 MED ORDER — SODIUM CHLORIDE 0.9% FLUSH
3.0000 mL | Freq: Two times a day (BID) | INTRAVENOUS | Status: DC
  Administered 2019-01-14 (×2): 3 mL via INTRAVENOUS

## 2019-01-14 MED ORDER — MAGNESIUM HYDROXIDE 400 MG/5ML PO SUSP: 30.0000 mL | Freq: Every day | ORAL | Status: DC | PRN

## 2019-01-14 MED ORDER — ENOXAPARIN SODIUM 40 MG/0.4ML ~~LOC~~ SOLN
40.0000 mg | SUBCUTANEOUS | Status: DC
  Administered 2019-01-14: 40 mg via SUBCUTANEOUS
  Filled 2019-01-14: qty 0.4

## 2019-01-14 MED ORDER — DULOXETINE HCL 30 MG PO CPEP
60.0000 mg | ORAL_CAPSULE | Freq: Every day | ORAL | Status: DC
  Administered 2019-01-14 – 2019-01-15 (×2): 60 mg via ORAL
  Filled 2019-01-14 (×2): qty 2

## 2019-01-14 MED ORDER — ACETAMINOPHEN 325 MG PO TABS: 650.0000 mg | ORAL_TABLET | Freq: Four times a day (QID) | ORAL | Status: DC | PRN

## 2019-01-14 MED ORDER — ONDANSETRON HCL 4 MG PO TABS: 4.0000 mg | ORAL_TABLET | Freq: Four times a day (QID) | ORAL | Status: DC | PRN

## 2019-01-14 MED ORDER — ASPIRIN EC 81 MG PO TBEC
162.0000 mg | DELAYED_RELEASE_TABLET | Freq: Every day | ORAL | Status: DC
  Administered 2019-01-14 – 2019-01-15 (×2): 162 mg via ORAL
  Filled 2019-01-14 (×2): qty 2

## 2019-01-14 MED ORDER — SODIUM CHLORIDE 0.9 % IV BOLUS
500.0000 mL | Freq: Once | INTRAVENOUS | Status: AC
  Administered 2019-01-14: 500 mL via INTRAVENOUS

## 2019-01-14 MED ORDER — INSULIN ASPART 100 UNIT/ML ~~LOC~~ SOLN
0.0000 [IU] | Freq: Three times a day (TID) | SUBCUTANEOUS | Status: DC
  Administered 2019-01-14 (×2): 3 [IU] via SUBCUTANEOUS
  Administered 2019-01-14: 5 [IU] via SUBCUTANEOUS
  Administered 2019-01-15: 3 [IU] via SUBCUTANEOUS
  Filled 2019-01-14 (×4): qty 1

## 2019-01-14 MED ORDER — ONDANSETRON HCL 4 MG/2ML IJ SOLN: 4.0000 mg | Freq: Four times a day (QID) | INTRAMUSCULAR | Status: DC | PRN

## 2019-01-14 NOTE — Progress Notes (Deleted)
Notified MD for safety sitter, agitation, restless, worsening AMS, pulling at IV's, order taken 

## 2019-01-14 NOTE — Consult Note (Signed)
[facility]  CARDIOLOGY CONSULT NOTE  Patient ID: Edward Schneider. MRN: [id number] DOB/AGE: October 30, 1955 63 y.o.  Admit date: 01/13/2019 Referring Physician Anselm Jungling Primary Physician [facility] Primary Cardiologist  Reason for Consultation syncope  HPI: 63 year old gentleman referred for evaluation of syncope.  The patient was in his usual state of health until day of admission when he experienced 3 separate episodes of syncope.  Episodes occurred while he was outside in the hot weather, with premonitory symptoms of dizziness and headache.  The patient reports a 50-monthhistory of recurrent episodes of syncope which typically occur while he is outside.  Scented to the emergency room where initial blood pressure was 93/68 with intermittent low blood pressures.  Patient complained of left-sided chest discomfort, CT scan revealed evidence of pulmonary embolus with evidence of coronary calcifications.  Patient does not experience exertional chest pain.  CT revealed normal sinus rhythm.  Telemetry reveals predominant sinus bradycardia at a rate of 58 bpm.  Admission labs notable for negative troponin less than 0.03.  Prior 2D echocardiogram 09/11/2015 revealed LVEF 50 to 55%.  Lexiscan Myoview 09/11/2015 revealed LVEF of 56% without evidence for scar or ischemia.  Review of systems complete and found to be negative unless listed above     Past Medical History:  Diagnosis Date  . Allergy   . Asthma   . C. difficile diarrhea   . Cancer ([facility] June 2016   liver cancer  . Chronic pain   . DDD (degenerative disc disease), cervical   . DDD (degenerative disc disease), lumbar   . Diabetes [place] complication ([place]   . GERD (gastroesophageal reflux disease)   . Headache    migraines - none since 02/17  . Hypertension   . Low blood sugar   . Myocardial infarction ([place]   . Seizures ([place]    several as child when sick.  None since age 63 . Stroke ([facility]    'mini-stroke" 30 yrs ago. no  deficits.  . Wears dentures    full upper and lower    Past Surgical History:  Procedure Laterality Date  . APPENDECTOMY    . BACK SURGERY    . CARDIAC CATHETERIZATION     stent placed in his "30's"  . COLONOSCOPY WITH PROPOFOL N/A 03/06/2016   Procedure: COLONOSCOPY WITH PROPOFOL;  Surgeon: DLucilla Lame MD;  [place]  Service: Endoscopy;  Laterality: N/A;  requests early  . ESOPHAGOGASTRODUODENOSCOPY (EGD) WITH PROPOFOL N/A 09/20/2017   Procedure: ESOPHAGOGASTRODUODENOSCOPY (EGD) WITH PROPOFOL;  Surgeon: WLucilla Lame MD;  [place]  Service: Endoscopy;  Laterality: N/A;  Diabetic - oral meds  . FINGER SURGERY Left   . KNEE SURGERY Right   . NECK SURGERY    . spleen surg    . spleen surgery    . TOE SURGERY Right     Medications Prior to Admission  Medication Sig Dispense Refill Last Dose  . aspirin EC 81 MG tablet Take 162 mg by mouth daily.    Unknown at Unknown  . atorvastatin (LIPITOR) 20 MG tablet Take 1 tablet (20 mg total) by mouth daily. 90 tablet 4 Unknown at Unknown  . benazepril (LOTENSIN) 20 MG tablet Take 1 tablet (20 mg total) by mouth daily. 90 tablet 3 Unknown at Unknown  . cephALEXin (KEFLEX) 500 MG capsule Take 500 mg by mouth 2 (two) times daily.   Unknown at Unknown  . doxepin (SINEQUAN) 25 MG capsule TAKE 2 CAPSULES BY MOUTH  ONCE DAILY AT NIGHT AT BEDTIME   Unknown at Unknown  . DULoxetine (CYMBALTA) 30 MG capsule LIMIT 1 TO 2 CAPSULES BY MOUTH DAILY   Unknown at Unknown  . metFORMIN (GLUCOPHAGE) 500 MG tablet Take 2 tablets (1,000 mg total) by mouth 2 (two) times daily with a meal. 360 tablet 4 Unknown at Unknown  . mupirocin ointment (BACTROBAN) 2 % APPLY OINTMENT TOPICALLY TWICE DAILY   Unknown at Unknown  . oxyCODONE (ROXICODONE) 15 MG immediate release tablet TAKE 1 2 TO 1 (ONE HALF TO ONE) TABLET BY MOUTH FOUR TO SIX TIMES DAILY IF TOLERTED NOTE TABLET IS 15 MG  0 Unknown at Unknown  . pantoprazole (PROTONIX) 40 MG  tablet Take 1 tablet (40 mg total) by mouth 2 (two) times daily. 180 tablet 4 Unknown at Unknown  . pregabalin (LYRICA) 50 MG capsule TAKE 1 CAPSULE BY MOUTH THREE TIMES DAILY 90 capsule 5 Unknown at Unknown  . sucralfate (CARAFATE) 1 g tablet Take 1 tablet (1 g total) by mouth 4 (four) times daily -  with meals and at bedtime. 360 tablet 4 Unknown at Unknown  . traZODone (DESYREL) 100 MG tablet Take 1 tablet (100 mg total) by mouth at bedtime as needed for sleep. 90 tablet 4 prn at prn  . triamcinolone cream (KENALOG) 0.1 % Apply 1 application topically 2 (two) times daily. 80 g 2 Unknown at Unknown  . vitamin B-12 (CYANOCOBALAMIN) 1000 MCG tablet Take 1,000 mcg by mouth daily.   Unknown at Unknown  . ACCU-CHEK AVIVA PLUS test strip USE TEST STRIP(S) TO CHECK GLUCOSE ONCE DAILY 100 each 12 N/A at N/A  . ACCU-CHEK SOFTCLIX LANCETS lancets USE TO CHECK GLUCOSE ONCE DAILY 100 each 2 N/A at N/A  . Blood Glucose Monitoring Suppl (ACCU-CHEK AVIVA PLUS) w/Device KIT USE   TO CHECK GLUCOSE ONCE DAILY 1 kit 11 N/A at N/A  . CINNAMON PO Take 2,400 mg by mouth daily.   Not Taking at Unknown time  . cyclobenzaprine (FLEXERIL) 10 MG tablet Take 1 tablet (10 mg total) by mouth 3 (three) times daily as needed for muscle spasms. (Patient not taking: Reported on 01/14/2019) 60 tablet 1 Not Taking at Unknown time  . diclofenac sodium (VOLTAREN) 1 % GEL Apply 2 g topically 4 (four) times daily. (Patient not taking: Reported on 01/14/2019) 100 g 3 Not Taking at Unknown time  . diphenhydrAMINE (BENADRYL) 25 mg capsule Take 25 mg by mouth 2 (two) times daily.    Not Taking at Unknown time  . diphenoxylate-atropine (LOMOTIL) 2.5-0.025 MG tablet Take 1 tablet by mouth 4 (four) times daily as needed for diarrhea or loose stools. (Patient not taking: Reported on 01/14/2019) 90 tablet 1 Not Taking at Unknown time  . DULoxetine (CYMBALTA) 60 MG capsule Take 1 capsule (60 mg total) by mouth daily. (Patient not taking: Reported on  01/14/2019) 90 capsule 4 Not Taking at Unknown time  . Ginseng 100 MG CAPS Take by mouth.   Not Taking at Unknown time  . Omega-3 1000 MG CAPS Take by mouth.   Not Taking at Unknown time  . sitaGLIPtin (JANUVIA) 100 MG tablet Take 1 tablet (100 mg total) by mouth daily. (Patient not taking: Reported on 01/14/2019) 30 tablet 4 Not Taking at Unknown time   Social History   Socioeconomic History  . Marital status: Married    Spouse name: Not on file  . Number of children: Not on file  . Years of education: Not on file  .  Highest education level: Not on file  Occupational History  . Not on file  Social Needs  . Financial resource strain: Not hard at all  . Food insecurity:    Worry: Never true    Inability: Never true  . Transportation needs:    Medical: No    Non-medical: No  Tobacco Use  . Smoking status: Former Smoker    Packs/day: 2.00    Years: 50.00    Pack years: 100.00    Types: Cigarettes    Last attempt to quit: 07/04/2012    Years since quitting: 6.5  . Smokeless tobacco: Never Used  Substance and Sexual Activity  . Alcohol use: No    Alcohol/week: 0.0 standard drinks  . Drug use: No  . Sexual activity: Not on file  Lifestyle  . Physical activity:    Days per week: 0 days    Minutes per session: 0 min  . Stress: Not at all  Relationships  . Social connections:    Talks on phone: Once a week    Gets together: Once a week    Attends religious service: More than 4 times per year    Active member of club or organization: No    Attends meetings of clubs or organizations: Never    Relationship status: Married  . Intimate partner violence:    Fear of current or ex partner: No    Emotionally abused: No    Physically abused: No    Forced sexual activity: No  Other Topics Concern  . Not on file  Social History Narrative  . Not on file    Family History  Problem Relation Age of Onset  . Arthritis Mother   . Diabetes Mother   . Kidney disease Mother   . Heart  disease Mother   . Hypertension Mother   . Arthritis Father   . Hearing loss Father   . Hypertension Father   . Diabetes Sister   . Heart disease Sister   . Diabetes Daughter   . Diabetes Maternal Aunt   . Diabetes Maternal Grandmother       Review of systems complete and found to be negative unless listed above      PHYSICAL EXAM  General: Well developed, well nourished, in no acute distress HEENT:  Normocephalic and atramatic Neck:  No JVD.  Lungs: Clear bilaterally to auscultation and percussion. Heart: HRRR . Normal S1 and S2 without gallops or murmurs.  Abdomen: Bowel sounds are positive, abdomen soft and non-tender  Msk:  Back normal, normal gait. Normal strength and tone for age. Extremities: No clubbing, cyanosis or edema.   Neuro: Alert and oriented X 3. Psych:  Good affect, responds appropriately  Labs:   Lab Results  Component Value Date   WBC 9.1 01/14/2019   HGB 10.7 (L) 01/14/2019   HCT 33.6 (L) 01/14/2019   MCV 89.6 01/14/2019   PLT 175 01/14/2019    Recent Labs  Lab 01/13/19 2205  01/14/19 0435  NA 142   < > 136  K 2.2*   < > 4.2  CL 125*   < > 107  CO2 13*   < > 23  BUN 12   < > 20  CREATININE 1.29*   < > 1.92*  CALCIUM 4.7*   < > 8.3*  PROT 3.1*  --   --   BILITOT 0.4  --   --   ALKPHOS 32*  --   --   ALT 20  --   --  AST 21  --   --   GLUCOSE 227*   < > 263*   < > = values in this interval not displayed.   Lab Results  Component Value Date   CKTOTAL 146 01/13/2019   TROPONINI <0.03 01/13/2019    Lab Results  Component Value Date   CHOL 142 04/27/2018   CHOL 152 11/03/2017   CHOL 196 03/29/2017   Lab Results  Component Value Date   HDL 40 04/27/2018   HDL 46 03/29/2017   HDL 49 03/25/2016   Lab Results  Component Value Date   LDLCALC 73 04/27/2018   LDLCALC 112 (H) 03/29/2017   LDLCALC 126 (H) 03/25/2016   Lab Results  Component Value Date   TRIG 143 04/27/2018   TRIG 203 (H) 11/03/2017   TRIG 192 (H) 03/29/2017    Lab Results  Component Value Date   CHOLHDL 3.6 04/27/2018   CHOLHDL 4.3 03/29/2017   CHOLHDL 3.1 09/23/2015   No results found for: LDLDIRECT    Radiology: Ct Head Wo Contrast  Result Date: 01/14/2019 CLINICAL DATA:  Intermittent dizziness, syncope EXAM: CT HEAD WITHOUT CONTRAST TECHNIQUE: Contiguous axial images were obtained from the base of the skull through the vertex without intravenous contrast. COMPARISON:  None. FINDINGS: Brain: No evidence of acute infarction, hemorrhage, hydrocephalus, extra-axial collection or mass lesion/mass effect. Vascular: No hyperdense vessel or unexpected calcification. Skull: Normal. Negative for fracture or focal lesion. Sinuses/Orbits: The visualized paranasal sinuses are essentially clear. The mastoid air cells are unopacified. Other: None. IMPRESSION: Normal head CT. Electronically Signed   By: Julian Hy M.D.   On: 01/14/2019 08:12   Ct Angio Chest Pe W And/or Wo Contrast  Result Date: 01/13/2019 CLINICAL DATA:  Chest pain, PE suspected, high prob. Syncope. EXAM: CT ANGIOGRAPHY CHEST WITH CONTRAST TECHNIQUE: Multidetector CT imaging of the chest was performed using the standard protocol during bolus administration of intravenous contrast. Multiplanar CT image reconstructions and MIPs were obtained to evaluate the vascular anatomy. CONTRAST:  55m OMNIPAQUE IOHEXOL 350 MG/ML SOLN COMPARISON:  Chest radiograph 12/16/2017. Chest CT 09/22/2017 FINDINGS: Cardiovascular: There are no filling defects within the pulmonary arteries to the proximal segmental level to suggest pulmonary embolus. More distal the thoracic aorta is normal in caliber without dissection. Minimal atherosclerosis. Heart size upper normal. Coronary artery calcifications. No pericardial effusion. Segmental and subsegmental branches are not well assessed given phase of contrast administration. Mediastinum/Nodes: Small bilateral hilar nodes are not enlarged by size criteria. No mediastinal  adenopathy. The esophagus is decompressed. No visualized thyroid nodule. Lungs/Pleura: No focal airspace disease. No pulmonary edema or pleural effusion. Small nodules in the right lung represents peri fissural thickening on reformats, unchanged from prior. Previous left lower lobe pulmonary nodule is no longer visualized. No new pulmonary nodules. Upper Abdomen: Prominent size liver with steatosis. Shotty periportal nodes are likely reactive. No acute findings. Musculoskeletal: Stable bone island in right anterior rib. Remote anterior right rib fractures. No acute fracture or acute osseous abnormality. Review of the MIP images confirms the above findings. IMPRESSION: 1. No central pulmonary embolus or acute intrathoracic abnormality. 2. Coronary artery calcifications. Electronically Signed   By: MKeith RakeM.D.   On: 01/13/2019 23:17    EKG: Normal sinus rhythm  ASSESSMENT AND PLAN:   1.  Syncope, sounds vasovagal in nature, normal ECG, unremarkable telemetry 2.  Left-sided chest pain, positional, atypical, negative initial troponin  Recommendations  1.  Agree with current therapy 2.  If patient rules out  for MI second troponin, may continue work-up as outpatient with Holter monitor and functional study  Sign off for now, please call if any questions  Signed: Isaias Cowman MD,PhD, [facility] 01/14/2019, 8:56 AM

## 2019-01-14 NOTE — ED Notes (Signed)
ED TO INPATIENT HANDOFF REPORT  ED Nurse Name and Phone #: Gwynn Burly Name/Age/Gender [name] 63 y.o. male Room/Bed: ED16A/ED16A  Code Status   Code Status: Full Code  Home/SNF/Other Home Patient oriented to: self, place, time and situation Is this baseline? Yes   Triage Complete: Triage complete  Chief Complaint hypotension, hyperglycemia  Triage Note Pt to ED via ems from home. Pt states he had 3 syncopal episodes this afternoon, last one at 1750. Pt arrives c/o chest pain that's new today. Pt states when he had last syncopal episode he fell into recliner, does not know if he hit his head. Pt arrives disoriented to place and groggy. CBG 450 on arrival. Pt was given 324 asprin by EMS.   Allergies Allergies  Allergen Reactions  . Bee Pollen Anaphylaxis    Died 3 times when stung by bees. Carries Epi-pen at all times. Died 3 times when stung by bees. Carries Epi-pen at all times.  . Bee Venom Anaphylaxis  . Crestor [Rosuvastatin Calcium] Shortness Of Breath and Swelling  . Fentanyl Itching and Hives    blisters Patch  . Shellfish Allergy Anaphylaxis and Swelling    Shrimp causes throat to swell and tingling in tongue. Can eat other white fish, crabcakes, and oysters. Shrimp causes throat to swell and tingling in tongue. Can eat other white fish, crabcakes, and oysters.  . Gabapentin Diarrhea    Severe diarrhea which caused incontinence, loss of appetite and weight loss.  Severe diarrhea which caused incontinence, loss of appetite and weight loss.  Severe diarrhea which caused incontinence, loss of appetite and weight loss.  Severe diarrhea which caused incontinence, loss of appetite and weight loss.   . Morphine Itching  . Morphine And Related Itching  . Simvastatin Diarrhea  . Buprenorphine Hcl Itching    Level of Care/Admitting Diagnosis ED Disposition    ED Disposition Condition [facility] Area: [place] [100120]   Level of Care: Telemetry [5]  Diagnosis: Syncope [308657]  Admitting Physician: Christel Mormon [[id number]]  Attending Physician: Christel Mormon [[id number]]  PT Class (Do Not Modify): Observation [104]  PT Acc Code (Do Not Modify): Observation [10022]       B Medical/Surgery History Past Medical History:  Diagnosis Date  . Allergy   . Asthma   . C. difficile diarrhea   . Cancer [facility]) June 2016   liver cancer  . Chronic pain   . DDD (degenerative disc disease), cervical   . DDD (degenerative disc disease), lumbar   . Diabetes mellitus without complication ([place])   . GERD (gastroesophageal reflux disease)   . Headache    migraines - none since 02/17  . Hypertension   . Low blood sugar   . Myocardial infarction ([place])   . Seizures ([place])    several as child when sick.  None since age 78  . Stroke [facility])    'mini-stroke" 30 yrs ago. no deficits.  . Wears dentures    full upper and lower   Past Surgical History:  Procedure Laterality Date  . APPENDECTOMY    . BACK SURGERY    . CARDIAC CATHETERIZATION     stent placed in his "30's"  . COLONOSCOPY WITH PROPOFOL N/A 03/06/2016   Procedure: COLONOSCOPY WITH PROPOFOL;  Surgeon: Lucilla Lame, MD;  Location: [place];  Service: Endoscopy;  Laterality: N/A;  requests early  . ESOPHAGOGASTRODUODENOSCOPY (EGD) WITH PROPOFOL N/A 09/20/2017   Procedure:  ESOPHAGOGASTRODUODENOSCOPY (EGD) WITH PROPOFOL;  Surgeon: Lucilla Lame, MD;  Location: [place];  Service: Endoscopy;  Laterality: N/A;  Diabetic - oral meds  . FINGER SURGERY Left   . KNEE SURGERY Right   . NECK SURGERY    . spleen surg    . spleen surgery    . TOE SURGERY Right      A IV Location/Drains/Wounds Patient Lines/Drains/Airways Status   Active Line/Drains/Airways    Name:   Placement date:   Placement time:   Site:   Days:   Peripheral IV 04/03/17 Left Wrist   04/03/17    2150    Wrist   651   Peripheral IV 04/04/17 Right Hand   04/04/17    0045    Hand    650   Peripheral IV 01/13/19 Left Antecubital   01/13/19    2131    Antecubital   1   Incision (Closed) 03/06/16 Rectum Other (Comment)   03/06/16    0741     1044   Incision (Closed) 03/06/16 Rectum Other (Comment)   03/06/16    0748     1044   Incision (Closed) 09/20/17 Throat   09/20/17    0830     481          Intake/Output Last 24 hours No intake or output data in the 24 hours ending 01/14/19 0244  Labs/Imaging Results for orders placed or performed during the hospital encounter of 01/13/19 (from the past 48 hour(s))  Glucose, capillary     Status: Abnormal   Collection Time: 01/13/19  9:16 PM  Result Value Ref Range   Glucose-Capillary 450 (H) 70 - 99 mg/dL  CBC     Status: Abnormal   Collection Time: 01/13/19  9:27 PM  Result Value Ref Range   WBC 13.0 (H) 4.0 - 10.5 K/uL   RBC 4.06 (L) 4.22 - 5.81 MIL/uL   Hemoglobin 11.6 (L) 13.0 - 17.0 g/dL   HCT 35.5 (L) 39.0 - 52.0 %   MCV 87.4 80.0 - 100.0 fL   MCH 28.6 26.0 - 34.0 pg   MCHC 32.7 30.0 - 36.0 g/dL   RDW 13.0 11.5 - 15.5 %   Platelets 203 150 - 400 K/uL   nRBC 0.0 0.0 - 0.2 %    Comment: Performed at [facility], [address] [place], [place] [address]  Urinalysis, Complete w Microscopic     Status: Abnormal   Collection Time: 01/13/19 10:05 PM  Result Value Ref Range   Color, Urine YELLOW (A) YELLOW   APPearance HAZY (A) CLEAR   Specific Gravity, Urine 1.018 1.005 - 1.030   pH 5.0 5.0 - 8.0   Glucose, UA >=500 (A) NEGATIVE mg/dL   Hgb urine dipstick NEGATIVE NEGATIVE   Bilirubin Urine NEGATIVE NEGATIVE   Ketones, ur NEGATIVE NEGATIVE mg/dL   Protein, ur NEGATIVE NEGATIVE mg/dL   Nitrite NEGATIVE NEGATIVE   Leukocytes,Ua NEGATIVE NEGATIVE   RBC / HPF 0-5 0 - 5 RBC/hpf   WBC, UA 0-5 0 - 5 WBC/hpf   Bacteria, UA NONE SEEN NONE SEEN   Squamous Epithelial / LPF 0-5 0 - 5   Mucus PRESENT    Hyaline Casts, UA PRESENT     Comment: Performed at [facility], [address]  [place], [place] [address]  Comprehensive metabolic panel     Status: Abnormal   Collection Time: 01/13/19 10:05 PM  Result Value Ref Range   Sodium 142  135 - 145 mmol/L   Potassium 2.2 (LL) 3.5 - 5.1 mmol/L    Comment: CRITICAL RESULT CALLED TO, READ BACK BY AND VERIFIED WITH DR. [place] ON 01/13/19 AT 2241 BY JAG    Chloride 125 (H) 98 - 111 mmol/L   CO2 13 (L) 22 - 32 mmol/L   Glucose, Bld 227 (H) 70 - 99 mg/dL   BUN 12 8 - 23 mg/dL   Creatinine, Ser 1.29 (H) 0.61 - 1.24 mg/dL   Calcium 4.7 (LL) 8.9 - 10.3 mg/dL    Comment: CRITICAL RESULT CALLED TO, READ BACK BY AND VERIFIED WITH DR. McR[place] ON 01/13/19 AT 2241 BY JAG    Total Protein 3.1 (L) 6.5 - 8.1 g/dL   Albumin 1.8 (L) 3.5 - 5.0 g/dL   AST 21 15 - 41 U/L   ALT 20 0 - 44 U/L   Alkaline Phosphatase 32 (L) 38 - 126 U/L   Total Bilirubin 0.4 0.3 - 1.2 mg/dL   GFR calc non Af Amer 59 (L) >60 mL/min   GFR calc Af Amer >60 >60 mL/min   Anion gap 4 (L) 5 - 15    Comment: Performed at [facility], [address] [place], [place] [address]  Troponin I -     Status: None   Collection Time: 01/13/19 10:05 PM  Result Value Ref Range   Troponin I <0.03 <0.03 ng/mL    Comment: Performed at [facility], [address] [place], [place] [address]  Basic metabolic panel     Status: Abnormal   Collection Time: 01/13/19 11:41 PM  Result Value Ref Range   Sodium 132 (L) 135 - 145 mmol/L   Potassium 4.3 3.5 - 5.1 mmol/L   Chloride 100 98 - 111 mmol/L   CO2 23 22 - 32 mmol/L   Glucose, Bld 346 (H) 70 - 99 mg/dL   BUN 20 8 - 23 mg/dL   Creatinine, Ser 2.39 (H) 0.61 - 1.24 mg/dL   Calcium 8.8 (L) 8.9 - 10.3 mg/dL   GFR calc non Af Amer 28 (L) >60 mL/min   GFR calc Af Amer 33 (L) >60 mL/min   Anion gap 9 5 - 15    Comment: Performed at [facility], [address] [place],  [address]  CK     Status: None   Collection Time: 01/13/19 11:41 PM  Result Value Ref Range   Total CK 146 49 - 397  U/L    Comment: Performed at [facility], [address] [place],  [address]   Ct Angio Chest Pe W And/or Wo Contrast  Result Date: 01/13/2019 CLINICAL DATA:  Chest pain, PE suspected, high prob. Syncope. EXAM: CT ANGIOGRAPHY CHEST WITH CONTRAST TECHNIQUE: Multidetector CT imaging of the chest was performed using the standard protocol during bolus administration of intravenous contrast. Multiplanar CT image reconstructions and MIPs were obtained to evaluate the vascular anatomy. CONTRAST:  10mL OMNIPAQUE IOHEXOL 350 MG/ML SOLN COMPARISON:  Chest radiograph 12/16/2017. Chest CT 09/22/2017 FINDINGS: Cardiovascular: There are no filling defects within the pulmonary arteries to the proximal segmental level to suggest pulmonary embolus. More distal the thoracic aorta is normal in caliber without dissection. Minimal atherosclerosis. Heart size upper normal. Coronary artery calcifications. No pericardial effusion. Segmental and subsegmental branches are not well assessed given phase of contrast administration. Mediastinum/Nodes: Small bilateral hilar nodes are not enlarged by size criteria. No mediastinal adenopathy. The esophagus is decompressed. No visualized thyroid nodule. Lungs/Pleura: No focal  airspace disease. No pulmonary edema or pleural effusion. Small nodules in the right lung represents peri fissural thickening on reformats, unchanged from prior. Previous left lower lobe pulmonary nodule is no longer visualized. No new pulmonary nodules. Upper Abdomen: Prominent size liver with steatosis. Shotty periportal nodes are likely reactive. No acute findings. Musculoskeletal: Stable bone island in right anterior rib. Remote anterior right rib fractures. No acute fracture or acute osseous abnormality. Review of the MIP images confirms the above findings. IMPRESSION: 1. No central pulmonary embolus or acute intrathoracic abnormality. 2. Coronary artery calcifications. Electronically Signed   By:  Keith Rake M.D.   On: 01/13/2019 23:17    Pending Labs Unresulted Labs (From admission, onward)    Start     Ordered   01/14/19 6063  Basic metabolic panel  Tomorrow morning,   STAT     01/14/19 0207   01/14/19 0500  CBC  Tomorrow morning,   STAT     01/14/19 0207          Vitals/Pain Today's Vitals   01/14/19 0130 01/14/19 0145 01/14/19 0200 01/14/19 0225  BP: (!) 64/44 (!) 87/52 (!) 85/55 101/67  Pulse: 62 63 62 74  Resp:      SpO2: 95% 95% 95% 99%  Weight:      Height:      PainSc:        Isolation Precautions No active isolations  Medications Medications  aspirin EC tablet 162 mg (has no administration in time range)  oxyCODONE (Oxy IR/ROXICODONE) immediate release tablet 15 mg (has no administration in time range)  atorvastatin (LIPITOR) tablet 20 mg (has no administration in time range)  DULoxetine (CYMBALTA) DR capsule 60 mg (has no administration in time range)  traZODone (DESYREL) tablet 100 mg (has no administration in time range)  pantoprazole (PROTONIX) EC tablet 40 mg (has no administration in time range)  sucralfate (CARAFATE) tablet 1 g (has no administration in time range)  vitamin B-12 (CYANOCOBALAMIN) tablet 1,000 mcg (has no administration in time range)  sodium chloride 0.9 % bolus 500 mL (has no administration in time range)  sodium chloride flush (NS) 0.9 % injection 3 mL (has no administration in time range)  enoxaparin (LOVENOX) injection 40 mg (has no administration in time range)  0.9 %  sodium chloride infusion (has no administration in time range)  acetaminophen (TYLENOL) tablet 650 mg (has no administration in time range)    Or  acetaminophen (TYLENOL) suppository 650 mg (has no administration in time range)  magnesium hydroxide (MILK OF MAGNESIA) suspension 30 mL (has no administration in time range)  ondansetron (ZOFRAN) tablet 4 mg (has no administration in time range)    Or  ondansetron (ZOFRAN) injection 4 mg (has no  administration in time range)  alum & mag hydroxide-simeth (MAALOX/MYLANTA) 200-200-20 MG/5ML suspension 30 mL (has no administration in time range)  sodium chloride 0.9 % bolus 1,000 mL (0 mLs Intravenous Stopped 01/13/19 2342)  sodium chloride 0.9 % bolus 1,000 mL (1,000 mLs Intravenous New Bag/Given 01/14/19 0018)  iohexol (OMNIPAQUE) 350 MG/ML injection 75 mL (75 mLs Intravenous Contrast Given 01/13/19 2256)    Mobility walks Low fall risk   Focused Assessments Neuro Assessment Handoff:  Swallow screen pass? Yes          Neuro Assessment:   Neuro Checks:      Last Documented NIHSS Modified Score:   Has TPA been given? No If patient is a Neuro Trauma and patient is going to OR  before floor call report to [place] nurse: [phone] or [phone]     R Recommendations: See Admitting Provider Note  Report given to:   Additional Notes: none

## 2019-01-14 NOTE — Care Management Obs Status (Signed)
[place] NOTIFICATION   Patient Details  Name: Edward Schneider. MRN: [id number] Date of Birth: 1955/11/18   Medicare Observation Status Notification Given:  Yes    Jassiel Flye A Destanie Tibbetts, RN 01/14/2019, 2:41 PM

## 2019-01-14 NOTE — Progress Notes (Signed)
[place] [facility] [place] NAME: Edward Schneider    MR#:  [id number]  DATE OF BIRTH:  [birth year]  SUBJECTIVE:  CHIEF COMPLAINT:   Chief Complaint  Patient presents with  . Loss of Consciousness  . Chest Pain   Patient came feeling very dizzy and syncopal.  Noted to be very dehydrated.  Blood pressure was low on arrival but stable after IV fluids. Cardiac enzymes are negative.  Telemetry monitoring did not show any abnormalities.  No signs of infection noted yet. Continue to feel very dizzy even with movement of his head. REVIEW OF SYSTEMS:  CONSTITUTIONAL: No fever, fatigue or weakness.  EYES: No blurred or double vision.  EARS, NOSE, AND THROAT: No tinnitus or ear pain.  RESPIRATORY: No cough, shortness of breath, wheezing or hemoptysis.  CARDIOVASCULAR: No chest pain, orthopnea, edema.  GASTROINTESTINAL: No nausea, vomiting, diarrhea or abdominal pain.  GENITOURINARY: No dysuria, hematuria.  ENDOCRINE: No polyuria, nocturia,  HEMATOLOGY: No anemia, easy bruising or bleeding SKIN: No rash or lesion. MUSCULOSKELETAL: No joint pain or arthritis.   NEUROLOGIC: No tingling, numbness, weakness.  PSYCHIATRY: No anxiety or depression.   ROS  DRUG ALLERGIES:   Allergies  Allergen Reactions  . Bee Pollen Anaphylaxis    Died 3 times when stung by bees. Carries Epi-pen at all times. Died 3 times when stung by bees. Carries Epi-pen at all times.  . Bee Venom Anaphylaxis  . Crestor [Rosuvastatin Calcium] Shortness Of Breath and Swelling  . Fentanyl Itching and Hives    blisters Patch  . Shellfish Allergy Anaphylaxis and Swelling    Shrimp causes throat to swell and tingling in tongue. Can eat other white fish, crabcakes, and oysters. Shrimp causes throat to swell and tingling in tongue. Can eat other white fish, crabcakes, and oysters.  . Gabapentin Diarrhea    Severe diarrhea which caused incontinence, loss of appetite and weight loss.  Severe  diarrhea which caused incontinence, loss of appetite and weight loss.  Severe diarrhea which caused incontinence, loss of appetite and weight loss.  Severe diarrhea which caused incontinence, loss of appetite and weight loss.   . Morphine Itching  . Morphine And Related Itching  . Simvastatin Diarrhea  . Buprenorphine Hcl Itching    VITALS:  Blood pressure 100/72, pulse 84, temperature 97.8 F (36.6 C), temperature source Oral, resp. rate 16, height 5\' 8"  (1.727 m), weight 102.5 kg, SpO2 98 %.  PHYSICAL EXAMINATION:  GENERAL:  63 y.o.-year-old patient lying in the bed with no acute distress.  EYES: Pupils equal, round, reactive to light and accommodation. No scleral icterus. Extraocular muscles intact.  HEENT: Head atraumatic, normocephalic. Oropharynx and nasopharynx clear.  NECK:  Supple, no jugular venous distention. No thyroid enlargement, no tenderness.  LUNGS: Normal breath sounds bilaterally, no wheezing, rales,rhonchi or crepitation. No use of accessory muscles of respiration.  CARDIOVASCULAR: S1, S2 normal. No murmurs, rubs, or gallops.  ABDOMEN: Soft, nontender, nondistended. Bowel sounds present. No organomegaly or mass.  EXTREMITIES: No pedal edema, cyanosis, or clubbing.  NEUROLOGIC: Cranial nerves II through XII are intact. Muscle strength 5/5 in all extremities. Sensation intact. Gait not checked.  PSYCHIATRIC: The patient is alert and oriented x 3.  SKIN: No obvious rash, lesion, or ulcer.   Physical Exam LABORATORY PANEL:   CBC Recent Labs  Lab 01/14/19 0435  WBC 9.1  HGB 10.7*  HCT 33.6*  PLT 175   ------------------------------------------------------------------------------------------------------------------  Chemistries  Recent Labs  Lab 01/13/19  2205  01/14/19 0435  NA 142   < > 136  K 2.2*   < > 4.2  CL 125*   < > 107  CO2 13*   < > 23  GLUCOSE 227*   < > 263*  BUN 12   < > 20  CREATININE 1.29*   < > 1.92*  CALCIUM 4.7*   < > 8.3*  AST 21   --   --   ALT 20  --   --   ALKPHOS 32*  --   --   BILITOT 0.4  --   --    < > = values in this interval not displayed.   ------------------------------------------------------------------------------------------------------------------  Cardiac Enzymes Recent Labs  Lab 01/13/19 2205 01/14/19 0911  TROPONINI <0.03 <0.03   ------------------------------------------------------------------------------------------------------------------  RADIOLOGY:  Ct Head Wo Contrast  Result Date: 01/14/2019 CLINICAL DATA:  Intermittent dizziness, syncope EXAM: CT HEAD WITHOUT CONTRAST TECHNIQUE: Contiguous axial images were obtained from the base of the skull through the vertex without intravenous contrast. COMPARISON:  None. FINDINGS: Brain: No evidence of acute infarction, hemorrhage, hydrocephalus, extra-axial collection or mass lesion/mass effect. Vascular: No hyperdense vessel or unexpected calcification. Skull: Normal. Negative for fracture or focal lesion. Sinuses/Orbits: The visualized paranasal sinuses are essentially clear. The mastoid air cells are unopacified. Other: None. IMPRESSION: Normal head CT. Electronically Signed   By: Julian Hy M.D.   On: 01/14/2019 08:12   Ct Angio Chest Pe W And/or Wo Contrast  Result Date: 01/13/2019 CLINICAL DATA:  Chest pain, PE suspected, high prob. Syncope. EXAM: CT ANGIOGRAPHY CHEST WITH CONTRAST TECHNIQUE: Multidetector CT imaging of the chest was performed using the standard protocol during bolus administration of intravenous contrast. Multiplanar CT image reconstructions and MIPs were obtained to evaluate the vascular anatomy. CONTRAST:  64mL OMNIPAQUE IOHEXOL 350 MG/ML SOLN COMPARISON:  Chest radiograph 12/16/2017. Chest CT 09/22/2017 FINDINGS: Cardiovascular: There are no filling defects within the pulmonary arteries to the proximal segmental level to suggest pulmonary embolus. More distal the thoracic aorta is normal in caliber without dissection.  Minimal atherosclerosis. Heart size upper normal. Coronary artery calcifications. No pericardial effusion. Segmental and subsegmental branches are not well assessed given phase of contrast administration. Mediastinum/Nodes: Small bilateral hilar nodes are not enlarged by size criteria. No mediastinal adenopathy. The esophagus is decompressed. No visualized thyroid nodule. Lungs/Pleura: No focal airspace disease. No pulmonary edema or pleural effusion. Small nodules in the right lung represents peri fissural thickening on reformats, unchanged from prior. Previous left lower lobe pulmonary nodule is no longer visualized. No new pulmonary nodules. Upper Abdomen: Prominent size liver with steatosis. Shotty periportal nodes are likely reactive. No acute findings. Musculoskeletal: Stable bone island in right anterior rib. Remote anterior right rib fractures. No acute fracture or acute osseous abnormality. Review of the MIP images confirms the above findings. IMPRESSION: 1. No central pulmonary embolus or acute intrathoracic abnormality. 2. Coronary artery calcifications. Electronically Signed   By: Keith Rake M.D.   On: 01/13/2019 23:17   Mr Brain Wo Contrast  Result Date: 01/14/2019 CLINICAL DATA:  Multiple episodes of syncope. These were preceded by dizziness and headache. EXAM: MRI HEAD WITHOUT CONTRAST TECHNIQUE: Multiplanar, multiecho pulse sequences of the brain and surrounding structures were obtained without intravenous contrast. COMPARISON:  01/14/2019. FINDINGS: Brain: No evidence for acute infarction, hemorrhage, mass lesion, hydrocephalus, or extra-axial fluid. Normal for age cerebral volume. Mild subcortical and periventricular T2 and FLAIR hyperintensities, likely chronic microvascular ischemic change. Vascular: Flow voids are maintained throughout  the carotid, basilar, and vertebral arteries. There are no areas of chronic hemorrhage. Skull and upper cervical spine: Unremarkable visualized  calvarium, skullbase, and cervical vertebrae. Pituitary, pineal, cerebellar tonsils unremarkable. No upper cervical cord lesions. Sinuses/Orbits: Mild chronic sinus disease.  Negative orbits. Other: No mastoid fluid.  Extracranial soft tissues unremarkable. IMPRESSION: Normal for age cerebral volume.  Mild small vessel disease. No acute intracranial findings are evident. There is no large vessel occlusion. Electronically Signed   By: Staci Righter M.D.   On: 01/14/2019 11:34    ASSESSMENT AND PLAN:   Active Problems:   Syncope   #1.  Recurrent syncope. The patient will be admitted to an observation telemetry bed.  This is likely   secondary to orthostatic hypotension.  check orthostatics q 12 hours.   obtain a 2D echo especially given chest pain..  hydrated with IV normal saline and monitored for arrhythmias. Likely due to dehydration or vestibular origin. As his significant dizziness persist, I will add meclizine and get MRI on the brain to rule out any stroke.  Get physical therapy consult to have vestibular exercise.  2.  Acute kidney injury.  This likely secondary to dehydration.  Will follow BMP with IV hydration with normal saline.  Will hold off his benazepril and metformin.  3.  Chest pain.    Appreciated Dr. Saralyn Pilar regarding the consult. Troponins are negative.  Suggested no further cardiac work-up.  4.  Uncontrolled type 2 diabetes mellitus.  on supplemental coverage with NovoLog.  His metformin will be held off as well as Januvia for now.  5.  Obstructive sleep apnea.  He will be continued on his CPAP at 7 cm water nightly.  6.  GERD.  PPI therapy and Carafate will be continued.  7.  Peripheral neuropathy.  Lyrica will be resumed.  8.  DVT prophylaxis.  This will be provided with subcutaneous Lovenox.  GI prophylaxis was addressed above.   All the records are reviewed and case discussed with Care Management/Social Workerr. Management plans discussed with the  patient, family and they are in agreement.  CODE STATUS: stable.  TOTAL TIME TAKING CARE OF THIS PATIENT:35  minutes.     POSSIBLE D/C IN 1-2 DAYS, DEPENDING ON CLINICAL CONDITION.   Vaughan Basta M.D on 01/14/2019   Between 7am to 6pm - Pager - [phone]  After 6pm go to [email or web address] - password [email or web address] [facility]  Office  [phone]  CC: Primary care physician; Guadalupe Maple, MD  Note: This dictation was prepared with Dragon dictation along with smaller phrase technology. Any transcriptional errors that result from this process are unintentional.

## 2019-01-14 NOTE — Progress Notes (Signed)
Physical Therapy Evaluation Patient Details Name: Edward Schneider. MRN: [id number] DOB: 1956-09-19 Today's Date: 01/14/2019   History of Present Illness  Edward Schneider  is a 63 y.o. male with a known history of multiple medical problems that will be mentioned below who presented to the emergency room with [facility] of 3 episodes of syncope preceded by headache and dizziness, with associated chest pain before his third episode.  This occurred over the last 24 hours.    Clinical Impression  Patient is independent with bed mobility, transfers and ambulation. He has no balance deficits in sitting or standing . His strength is [facility] and he has no PT  skilled needs at this time. He ambulated 300 feet independently without reports of pain.     Follow Up Recommendations No PT follow up    Equipment Recommendations       Recommendations for Other Services       Precautions / Restrictions Precautions Precautions: Fall Restrictions Weight Bearing Restrictions: No      Mobility  Bed Mobility Overal bed mobility: Independent                Transfers Overall transfer level: Independent                  Ambulation/Gait Ambulation/Gait assistance: Independent Gait Distance (Feet): 300 Feet Assistive device: Rolling walker (2 wheeled)          Stairs            Wheelchair Mobility    Modified Rankin (Stroke Patients Only)       Balance Overall balance assessment: Independent                                           Pertinent Vitals/Pain Pain Assessment: No/denies pain    Home Living Family/patient expects to be discharged to:: Private residence Living Arrangements: Spouse/significant other Available Help at Discharge: Family Type of Home: House Home Access: Stairs to enter Entrance Stairs-Rails: None Technical brewer of Steps: 2 Home Layout: One level        Prior Function Level of Independence: Independent               Hand Dominance        Extremity/Trunk Assessment   Upper Extremity Assessment Upper Extremity Assessment: Overall WFL for tasks assessed    Lower Extremity Assessment Lower Extremity Assessment: Overall WFL for tasks assessed       Communication   Communication: No difficulties  Cognition Arousal/Alertness: Awake/alert                                            General Comments      Exercises     Assessment/Plan    PT Assessment Patent does not need any further PT services  PT Problem List         PT Treatment Interventions      PT Goals (Current goals can be found in the Care Plan section)  Acute Rehab PT Goals Patient Stated Goal: (to go hoome) PT Goal Formulation: With patient Time For Goal Achievement: 01/14/19 Potential to Achieve Goals: Good    Frequency     Barriers to discharge        Co-evaluation  AM-PAC PT "6 Clicks" Mobility  Outcome Measure Help needed turning from your back to your side while in a flat bed without using bedrails?: None Help needed moving from lying on your back to sitting on the side of a flat bed without using bedrails?: None Help needed moving to and from a bed to a chair (including a wheelchair)?: None Help needed standing up from a chair using your arms (e.g., wheelchair or bedside chair)?: None Help needed to walk in hospital room?: None Help needed climbing 3-5 steps with a railing? : A Little 6 Click Score: 23    End of Session Equipment Utilized During Treatment: Gait belt Activity Tolerance: Patient tolerated treatment well Patient left: in bed Nurse Communication: Mobility status PT Visit Diagnosis: Repeated falls (R29.6)    Time: 2330-0762 PT Time Calculation (min) (ACUTE ONLY): 15 min   Charges:   PT Evaluation $PT Eval Low Complexity: 1 Low            Alanson Puls, PT DPT 01/14/2019, 6:37 PM

## 2019-01-14 NOTE — H&P (Addendum)
[facility] NAME: Edward Schneider    MR#:  [id number]  DATE OF BIRTH:  1955-12-14  DATE OF ADMISSION:  01/13/2019  PRIMARY CARE PHYSICIAN: Guadalupe Maple, MD   REQUESTING/REFERRING PHYSICIAN: Merlyn Lot, MD CHIEF COMPLAINT:   Chief Complaint  Patient presents with   Loss of Consciousness   Chest Pain    HISTORY OF PRESENT ILLNESS:  Edward Schneider  is a 63 y.o. male with a known history of multiple medical problems that will be mentioned below who presented to the emergency room with [facility] of 3 episodes of syncope preceded by headache and dizziness, with associated chest pain before his third episode.  This occurred over the last 24 hours.  He denied any head injuries or other injuries.  He denied any change in new medications.  He admitted to mild dyspnea without cough or wheezing or fever or chills.  He denied any paresthesias or focal muscle weakness.  He described his chest pain as midsternal sharp pain graded 9/10 in severity with radiation to his left arm.  No tinnitus or vertigo.  No nausea vomiting or abdominal pain or diarrhea or melena or bright red blood per rectum.  No other bleeding diathesis.  Upon presentation to the emergency room, his blood pressure was 93/68 with otherwise normal vital signs however is dropped as low as 66/43.  He was given 2 L of IV normal saline bolus and his systolic was 448 during my interview.  His EKG showed normal sinus rhythm with a rate of 89.  He had a chest CTA that showed coronary artery calcifications without evidence for PE or aortic dissection or other acute abnormalities.  His labs were remarkable for elevated BUN and creatinine above his previous levels.  His creatinine has gone up from 1.05-2.39 and BUN from 10-20.  His troponin I was less than 0.03 and his CBC showed no leukocytosis of 13 with mild anemia with hemoglobin of 11.6 hematocrit 35.5 impaired to 13.6 and 42.3 on  04/27/2018.  His blood glucose was 227.  Urinalysis showed more than 500 glucose but otherwise unremarkable.  The patient was given 2 L bolus of IV normal saline.  He will be admitted to an observation telemetry bed for further evaluation and management. PAST MEDICAL HISTORY:   Past Medical History:  Diagnosis Date   Allergy    Asthma    C. difficile diarrhea    Cancer [facility]) June 2016   liver cancer   Chronic pain    DDD (degenerative disc disease), cervical    DDD (degenerative disc disease), lumbar    Diabetes mellitus without complication (HCC)    GERD (gastroesophageal reflux disease)    Headache    migraines - none since 02/17   Hypertension    Low blood sugar    Myocardial infarction ([place])    Seizures ([place])    several as child when sick.  None since age 55   Stroke [facility])    'mini-stroke" 30 yrs ago. no deficits.   Wears dentures    full upper and lower    PAST SURGICAL HISTORY:   Past Surgical History:  Procedure Laterality Date   APPENDECTOMY     BACK SURGERY     CARDIAC CATHETERIZATION     stent placed in his "57's"   COLONOSCOPY WITH PROPOFOL N/A 03/06/2016   Procedure: COLONOSCOPY WITH PROPOFOL;  Surgeon: Lucilla Lame, MD;  Location: [place];  Service: Endoscopy;  Laterality: N/A;  requests early   ESOPHAGOGASTRODUODENOSCOPY (EGD) WITH PROPOFOL N/A 09/20/2017   Procedure: ESOPHAGOGASTRODUODENOSCOPY (EGD) WITH PROPOFOL;  Surgeon: Lucilla Lame, MD;  Location: [place];  Service: Endoscopy;  Laterality: N/A;  Diabetic - oral meds   FINGER SURGERY Left    KNEE SURGERY Right    NECK SURGERY     spleen surg     spleen surgery     TOE SURGERY Right     SOCIAL HISTORY:   Social History   Tobacco Use   Smoking status: Former Smoker    Packs/day: 2.00    Years: 50.00    Pack years: 100.00    Types: Cigarettes    Last attempt to quit: 07/04/2012    Years since quitting: 6.5   Smokeless tobacco: Never Used    Substance Use Topics   Alcohol use: No    Alcohol/week: 0.0 standard drinks    FAMILY HISTORY:   Family History  Problem Relation Age of Onset   Arthritis Mother    Diabetes Mother    Kidney disease Mother    Heart disease Mother    Hypertension Mother    Arthritis Father    Hearing loss Father    Hypertension Father    Diabetes Sister    Heart disease Sister    Diabetes Daughter    Diabetes Maternal Aunt    Diabetes Maternal Grandmother     DRUG ALLERGIES:   Allergies  Allergen Reactions   Bee Pollen Anaphylaxis    Died 3 times when stung by bees. Carries Epi-pen at all times. Died 3 times when stung by bees. Carries Epi-pen at all times.   Bee Venom Anaphylaxis   Crestor [Rosuvastatin Calcium] Shortness Of Breath and Swelling   Fentanyl Itching and Hives    blisters Patch   Shellfish Allergy Anaphylaxis and Swelling    Shrimp causes throat to swell and tingling in tongue. Can eat other white fish, crabcakes, and oysters. Shrimp causes throat to swell and tingling in tongue. Can eat other white fish, crabcakes, and oysters.   Gabapentin Diarrhea    Severe diarrhea which caused incontinence, loss of appetite and weight loss.  Severe diarrhea which caused incontinence, loss of appetite and weight loss.  Severe diarrhea which caused incontinence, loss of appetite and weight loss.  Severe diarrhea which caused incontinence, loss of appetite and weight loss.    Morphine Itching   Morphine And Related Itching   Simvastatin Diarrhea   Buprenorphine Hcl Itching    REVIEW OF SYSTEMS:   ROS As per history of present illness. All pertinent systems were reviewed above. Constitutional,  HEENT, cardiovascular, respiratory, GI, GU, musculoskeletal, neuro, psychiatric, endocrine,  integumentary and hematologic systems were reviewed and are otherwise  negative/unremarkable except for positive findings mentioned above in the HPI.   MEDICATIONS AT  HOME:   Prior to Admission medications   Medication Sig Start Date End Date Taking? Authorizing Provider  aspirin EC 81 MG tablet Take 162 mg by mouth daily.    Yes [provider]  atorvastatin (LIPITOR) 20 MG tablet Take 1 tablet (20 mg total) by mouth daily. 04/27/18  Yes Crissman, Jeannette How, MD  benazepril (LOTENSIN) 20 MG tablet Take 1 tablet (20 mg total) by mouth daily. 08/03/18  Yes Crissman, Jeannette How, MD  cephALEXin (KEFLEX) 500 MG capsule Take 500 mg by mouth 2 (two) times daily. 12/28/18  Yes [provider]  doxepin (SINEQUAN) 25 MG capsule TAKE 2 CAPSULES BY MOUTH  ONCE DAILY AT NIGHT AT BEDTIME 12/29/18  Yes [provider]  DULoxetine (CYMBALTA) 30 MG capsule LIMIT 1 TO 2 CAPSULES BY MOUTH DAILY 12/26/18  Yes [provider]  metFORMIN (GLUCOPHAGE) 500 MG tablet Take 2 tablets (1,000 mg total) by mouth 2 (two) times daily with a meal. 04/27/18  Yes Crissman, Jeannette How, MD  mupirocin ointment (BACTROBAN) 2 % APPLY OINTMENT TOPICALLY TWICE DAILY 11/23/18  Yes [provider]  oxyCODONE (ROXICODONE) 15 MG immediate release tablet TAKE 1 2 TO 1 (ONE HALF TO ONE) TABLET BY MOUTH FOUR TO SIX TIMES DAILY IF TOLERTED NOTE TABLET IS 15 MG 08/24/18  Yes [provider]  pantoprazole (PROTONIX) 40 MG tablet Take 1 tablet (40 mg total) by mouth 2 (two) times daily. 04/27/18  Yes Crissman, Jeannette How, MD  pregabalin (LYRICA) 50 MG capsule TAKE 1 CAPSULE BY MOUTH THREE TIMES DAILY 11/29/18  Yes Crissman, Jeannette How, MD  sucralfate (CARAFATE) 1 g tablet Take 1 tablet (1 g total) by mouth 4 (four) times daily -  with meals and at bedtime. 04/27/18  Yes Crissman, Jeannette How, MD  traZODone (DESYREL) 100 MG tablet Take 1 tablet (100 mg total) by mouth at bedtime as needed for sleep. 04/27/18  Yes Crissman, Jeannette How, MD  triamcinolone cream (KENALOG) 0.1 % Apply 1 application topically 2 (two) times daily. 05/30/18  Yes Crissman, Jeannette How, MD  vitamin B-12 (CYANOCOBALAMIN) 1000 MCG tablet  Take 1,000 mcg by mouth daily.   Yes [provider]  ACCU-CHEK AVIVA PLUS test strip USE TEST STRIP(S) TO CHECK GLUCOSE ONCE DAILY 06/30/17   Guadalupe Maple, MD  ACCU-CHEK SOFTCLIX LANCETS lancets USE TO CHECK GLUCOSE ONCE DAILY 06/30/17   Guadalupe Maple, MD  Blood Glucose Monitoring Suppl (ACCU-CHEK AVIVA PLUS) w/Device KIT USE   TO CHECK GLUCOSE ONCE DAILY 07/25/18   Volney American, PA-C  CINNAMON PO Take 2,400 mg by mouth daily.    [provider]  cyclobenzaprine (FLEXERIL) 10 MG tablet Take 1 tablet (10 mg total) by mouth 3 (three) times daily as needed for muscle spasms. Patient not taking: Reported on 01/14/2019 01/07/18   Lucilla Lame, MD  diclofenac sodium (VOLTAREN) 1 % GEL Apply 2 g topically 4 (four) times daily. Patient not taking: Reported on 01/14/2019 06/23/18   Volney American, PA-C  diphenhydrAMINE (BENADRYL) 25 mg capsule Take 25 mg by mouth 2 (two) times daily.     [provider]  diphenoxylate-atropine (LOMOTIL) 2.5-0.025 MG tablet Take 1 tablet by mouth 4 (four) times daily as needed for diarrhea or loose stools. Patient not taking: Reported on 01/14/2019 09/29/17   Lucilla Lame, MD  DULoxetine (CYMBALTA) 60 MG capsule Take 1 capsule (60 mg total) by mouth daily. Patient not taking: Reported on 01/14/2019 04/27/18   Guadalupe Maple, MD  Ginseng 100 MG CAPS Take by mouth.    [provider]  Omega-3 1000 MG CAPS Take by mouth.    [provider]  sitaGLIPtin (JANUVIA) 100 MG tablet Take 1 tablet (100 mg total) by mouth daily. Patient not taking: Reported on 01/14/2019 08/03/18   Guadalupe Maple, MD      VITAL SIGNS:  Blood pressure 101/67, pulse 74, resp. rate 17, height _0  (1.727 m), weight 99.3 kg, SpO2 99 %.  PHYSICAL EXAMINATION:  Physical Exam  GENERAL:  63 y.o.-year-old Caucasian male patient lying in the bed with no acute distress.  EYES: Pupils equal, round, reactive to light  and accommodation. No  scleral icterus. Extraocular muscles intact.  HEENT: Head atraumatic, normocephalic. Oropharynx with slightly dry tongue and moist mucous membrane and nasopharynx clear.  NECK:  Supple, no jugular venous distention. No thyroid enlargement, no tenderness.  LUNGS: Normal breath sounds bilaterally, no wheezing, rales,rhonchi or crepitation. No use of accessory muscles of respiration.  CARDIOVASCULAR: S1, S2 normal. No murmurs, rubs, or gallops.  ABDOMEN: Soft, nontender, nondistended. Bowel sounds present. No organomegaly or mass.  EXTREMITIES: No pedal edema, cyanosis, or clubbing.  NEUROLOGIC: Cranial nerves II through XII are intact. Muscle strength 5/5 in all extremities. Sensation intact. Gait not checked.  PSYCHIATRIC: The patient is alert and oriented x 3.  SKIN: No obvious rash, lesion, or ulcer.   LABORATORY PANEL:   CBC Recent Labs  Lab 01/13/19 2127  WBC 13.0*  HGB 11.6*  HCT 35.5*  PLT 203   ------------------------------------------------------------------------------------------------------------------  Chemistries  Recent Labs  Lab 01/13/19 2205 01/13/19 2341  NA 142 132*  K 2.2* 4.3  CL 125* 100  CO2 13* 23  GLUCOSE 227* 346*  BUN 12 20  CREATININE 1.29* 2.39*  CALCIUM 4.7* 8.8*  AST 21  --   ALT 20  --   ALKPHOS 32*  --   BILITOT 0.4  --    ------------------------------------------------------------------------------------------------------------------  Cardiac Enzymes Recent Labs  Lab 01/13/19 2205  TROPONINI <0.03   ------------------------------------------------------------------------------------------------------------------  RADIOLOGY:  Ct Angio Chest Pe W And/or Wo Contrast  Result Date: 01/13/2019 CLINICAL DATA:  Chest pain, PE suspected, high prob. Syncope. EXAM: CT ANGIOGRAPHY CHEST WITH CONTRAST TECHNIQUE: Multidetector CT imaging of the chest was performed using the standard protocol during bolus administration of intravenous  contrast. Multiplanar CT image reconstructions and MIPs were obtained to evaluate the vascular anatomy. CONTRAST:  73m OMNIPAQUE IOHEXOL 350 MG/ML SOLN COMPARISON:  Chest radiograph 12/16/2017. Chest CT 09/22/2017 FINDINGS: Cardiovascular: There are no filling defects within the pulmonary arteries to the proximal segmental level to suggest pulmonary embolus. More distal the thoracic aorta is normal in caliber without dissection. Minimal atherosclerosis. Heart size upper normal. Coronary artery calcifications. No pericardial effusion. Segmental and subsegmental branches are not well assessed given phase of contrast administration. Mediastinum/Nodes: Small bilateral hilar nodes are not enlarged by size criteria. No mediastinal adenopathy. The esophagus is decompressed. No visualized thyroid nodule. Lungs/Pleura: No focal airspace disease. No pulmonary edema or pleural effusion. Small nodules in the right lung represents peri fissural thickening on reformats, unchanged from prior. Previous left lower lobe pulmonary nodule is no longer visualized. No new pulmonary nodules. Upper Abdomen: Prominent size liver with steatosis. Shotty periportal nodes are likely reactive. No acute findings. Musculoskeletal: Stable bone island in right anterior rib. Remote anterior right rib fractures. No acute fracture or acute osseous abnormality. Review of the MIP images confirms the above findings. IMPRESSION: 1. No central pulmonary embolus or acute intrathoracic abnormality. 2. Coronary artery calcifications. Electronically Signed   By: MKeith RakeM.D.   On: 01/13/2019 23:17      IMPRESSION AND PLAN:   #1.  Recurrent syncope. The patient will be admitted to an observation telemetry bed.  This is likely    secondary to orthostatic hypotension.  Will check orthostatics q 12 hours.  Will obtain a 2D echo especially given chest pain..  The patient will be hydrated with IV normal saline and monitored for  arrhythmias. Differential diagnoses would include neurally mediated syncope, cardiogenic, arrhythmias related,  and less likely hypoglycemia.  2.  Acute kidney injury.  This likely  secondary to dehydration.  Will follow BMP with IV hydration with normal saline.  Will hold off his benazepril and metformin.  3.  Chest pain.  We will follow serial cardiac enzymes and obtain a cardiology consultation in a.m. for further cardiac risk stratification.  The patient will be placed on aspirin as well as PRN sublingual nitroglycerin and morphine sulfate.  I contacted Dr. Saralyn Pilar regarding the consult.  4.  Uncontrolled type 2 diabetes mellitus.  He will be placed on supplemental coverage with NovoLog.  His metformin will be held off as well as Januvia for now.  5.  Obstructive sleep apnea.  He will be continued on his CPAP at 7 cm water nightly.  6.  GERD.  PPI therapy and Carafate will be continued.  7.  Peripheral neuropathy.  Lyrica will be resumed.  8.  DVT prophylaxis.  This will be provided with subcutaneous Lovenox.  GI prophylaxis was addressed above.   All the records are reviewed and case discussed with ED provider.  The plan of care was discussed in details with the patient . I answered all questions. The patient agreed to proceed with the above mentioned plan. Further management will depend upon hospital course.  This dictation has been created by [employer].  Any transcription errors that resulted from this process are unintentional.    CODE STATUS: Full code  TOTAL TIME TAKING CARE OF THIS PATIENT: 50 minutes minutes.    Christel Mormon M.D on 01/14/2019 at 2:58 AM  Pager - [phone]  After 6pm go to [email or web address] - Proofreader  [facility] [place] [facility]  Office  [phone]  CC: Primary care physician; Guadalupe Maple, MD   Note: This dictation was prepared with Dragon dictation along with smaller phrase technology.  Any transcriptional errors that result from this process are unintentional.

## 2019-01-15 DIAGNOSIS — E1165 Type 2 diabetes mellitus with hyperglycemia: Secondary | ICD-10-CM | POA: Diagnosis not present

## 2019-01-15 DIAGNOSIS — R55 Syncope and collapse: Secondary | ICD-10-CM | POA: Diagnosis not present

## 2019-01-15 DIAGNOSIS — R079 Chest pain, unspecified: Secondary | ICD-10-CM | POA: Diagnosis not present

## 2019-01-15 DIAGNOSIS — N179 Acute kidney failure, unspecified: Secondary | ICD-10-CM | POA: Diagnosis not present

## 2019-01-15 LAB — BASIC METABOLIC PANEL
Anion gap: 7 (ref 5–15)
BUN: 16 mg/dL (ref 8–23)
CHLORIDE: 106 mmol/L (ref 98–111)
CO2: 25 mmol/L (ref 22–32)
Calcium: 9.2 mg/dL (ref 8.9–10.3)
Creatinine, Ser: 0.98 mg/dL (ref 0.61–1.24)
GFR calc Af Amer: >60 mL/min (ref >60)
GFR calc non Af Amer: >60 mL/min (ref >60)
Glucose, Bld: 212 mg/dL — ABNORMAL HIGH (ref 70–99)
Potassium: 4.5 mmol/L (ref 3.5–5.1)
Sodium: 138 mmol/L (ref 135–145)

## 2019-01-15 LAB — GLUCOSE, CAPILLARY: Glucose-Capillary: 211 mg/dL — ABNORMAL HIGH (ref 70–99)

## 2019-01-15 NOTE — Discharge Summary (Signed)
[name] NAME: Edward Schneider    MR#:  [id number]  DATE OF BIRTH:  [birth year]  DATE OF ADMISSION:  01/13/2019 ADMITTING PHYSICIAN: Christel Mormon, MD  DATE OF DISCHARGE: 01/15/2019   PRIMARY CARE PHYSICIAN: Guadalupe Maple, MD    ADMISSION DIAGNOSIS:  Syncope and collapse [R55] AKI (acute kidney injury) ([place]) [N17.9] Hypotension, unspecified hypotension type [I95.9]  DISCHARGE DIAGNOSIS:  Active Problems:   Syncope   SECONDARY DIAGNOSIS:   Past Medical History:  Diagnosis Date  . Allergy   . Asthma   . C. difficile diarrhea   . Cancer [facility]) June 2016   liver cancer  . Chronic pain   . DDD (degenerative disc disease), cervical   . DDD (degenerative disc disease), lumbar   . Diabetes mellitus without complication ([place])   . GERD (gastroesophageal reflux disease)   . Headache    migraines - none since 02/17  . Hypertension   . Low blood sugar   . Myocardial infarction ([place])   . Seizures ([place])    several as child when sick.  None since age 63  . Stroke [facility])    'mini-stroke" 30 yrs ago. no deficits.  . Wears dentures    full upper and lower    HOSPITAL COURSE:   #1. Recurrent syncope.The patient will be admitted to an observationtelemetrybed. This is likelysecondary to orthostatic hypotension.  check orthostatics q 12 hours. stable now.  obtaina2D echoespecially given chest pain..  hydrated with IV normal saline and monitored for arrhythmias. Likely due to dehydration or vestibular origin. As his significant dizziness persist, I added meclizine and got MRI on the brain to rule out any stroke.  His MRI was negative. He felt significantly better the next day as his renal function and dehydration improved and he was able to walk more than 300 steps with physical therapy without any dizziness.  2. Acute kidney injury. This likely secondary to dehydration. Will follow BMP with IV hydration with  normal saline.  held off his benazepril and metformin.  Renal function improved and back to normal.  3. Chest pain.   Appreciated Dr. Saralyn Pilar regarding the consult. Troponins are negative.  Suggested no further cardiac work-up.  4. Uncontrolled type 2diabetes mellitus. on supplemental coverage with NovoLog. His metformin will be held off as well as Januvia for now.  5. Obstructive sleep apnea. He will be continued on his CPAP at 7 cm water nightly.  6. GERD. PPI therapy and Carafate will be continued.  7. Peripheral neuropathy.Lyrica will be resumed.  8. DVT prophylaxis. This will be provided with subcutaneous Lovenox. GI prophylaxis was addressed above.  DISCHARGE CONDITIONS:   Stable  CONSULTS OBTAINED:  Treatment Team:  Isaias Cowman, MD  DRUG ALLERGIES:   Allergies  Allergen Reactions  . Bee Pollen Anaphylaxis    Died 3 times when stung by bees. Carries Epi-pen at all times. Died 3 times when stung by bees. Carries Epi-pen at all times.  . Bee Venom Anaphylaxis  . Crestor [Rosuvastatin Calcium] Shortness Of Breath and Swelling  . Fentanyl Itching and Hives    blisters Patch  . Shellfish Allergy Anaphylaxis and Swelling    Shrimp causes throat to swell and tingling in tongue. Can eat other white fish, crabcakes, and oysters. Shrimp causes throat to swell and tingling in tongue. Can eat other white fish, crabcakes, and oysters.  . Gabapentin Diarrhea    Severe diarrhea which caused incontinence, loss  of appetite and weight loss.  Severe diarrhea which caused incontinence, loss of appetite and weight loss.  Severe diarrhea which caused incontinence, loss of appetite and weight loss.  Severe diarrhea which caused incontinence, loss of appetite and weight loss.   . Morphine Itching  . Morphine And Related Itching  . Simvastatin Diarrhea  . Buprenorphine Hcl Itching    DISCHARGE MEDICATIONS:   Allergies as of 01/15/2019      Reactions    Bee Pollen Anaphylaxis   Died 3 times when stung by bees. Carries Epi-pen at all times. Died 3 times when stung by bees. Carries Epi-pen at all times.   Bee Venom Anaphylaxis   Crestor [rosuvastatin Calcium] Shortness Of Breath, Swelling   Fentanyl Itching, Hives   blisters Patch   Shellfish Allergy Anaphylaxis, Swelling   Shrimp causes throat to swell and tingling in tongue. Can eat other white fish, crabcakes, and oysters. Shrimp causes throat to swell and tingling in tongue. Can eat other white fish, crabcakes, and oysters.   Gabapentin Diarrhea   Severe diarrhea which caused incontinence, loss of appetite and weight loss.  Severe diarrhea which caused incontinence, loss of appetite and weight loss.  Severe diarrhea which caused incontinence, loss of appetite and weight loss.  Severe diarrhea which caused incontinence, loss of appetite and weight loss.    Morphine Itching   Morphine And Related Itching   Simvastatin Diarrhea   Buprenorphine Hcl Itching      Medication List    STOP taking these medications   benazepril 20 MG tablet Commonly known as:  LOTENSIN   cephALEXin 500 MG capsule Commonly known as:  KEFLEX     TAKE these medications   Accu-Chek Aviva Plus test strip Generic drug:  glucose blood USE TEST STRIP(S) TO CHECK GLUCOSE ONCE DAILY   Accu-Chek Aviva Plus w/Device Kit USE   TO CHECK GLUCOSE ONCE DAILY   Accu-Chek Softclix Lancets lancets USE TO CHECK GLUCOSE ONCE DAILY   aspirin EC 81 MG tablet Take 162 mg by mouth daily.   atorvastatin 20 MG tablet Commonly known as:  LIPITOR Take 1 tablet (20 mg total) by mouth daily.   CINNAMON PO Take 2,400 mg by mouth daily.   cyclobenzaprine 10 MG tablet Commonly known as:  FLEXERIL Take 1 tablet (10 mg total) by mouth 3 (three) times daily as needed for muscle spasms.   diclofenac sodium 1 % Gel Commonly known as:  VOLTAREN Apply 2 g topically 4 (four) times daily.   diphenhydrAMINE 25 mg  capsule Commonly known as:  BENADRYL Take 25 mg by mouth 2 (two) times daily.   diphenoxylate-atropine 2.5-0.025 MG tablet Commonly known as:  Lomotil Take 1 tablet by mouth 4 (four) times daily as needed for diarrhea or loose stools.   doxepin 25 MG capsule Commonly known as:  SINEQUAN TAKE 2 CAPSULES BY MOUTH ONCE DAILY AT NIGHT AT BEDTIME   DULoxetine 60 MG capsule Commonly known as:  CYMBALTA Take 1 capsule (60 mg total) by mouth daily. What changed:  Another medication with the same name was removed. Continue taking this medication, and follow the directions you see here.   Ginseng 100 MG Caps Take by mouth.   metFORMIN 500 MG tablet Commonly known as:  GLUCOPHAGE Take 2 tablets (1,000 mg total) by mouth 2 (two) times daily with a meal.   mupirocin ointment 2 % Commonly known as:  BACTROBAN APPLY OINTMENT TOPICALLY TWICE DAILY   Omega-3 1000 MG Caps Take by  mouth.   oxyCODONE 15 MG immediate release tablet Commonly known as:  ROXICODONE TAKE 1 2 TO 1 (ONE HALF TO ONE) TABLET BY MOUTH FOUR TO SIX TIMES DAILY IF TOLERTED NOTE TABLET IS 15 MG   pantoprazole 40 MG tablet Commonly known as:  PROTONIX Take 1 tablet (40 mg total) by mouth 2 (two) times daily.   pregabalin 50 MG capsule Commonly known as:  LYRICA TAKE 1 CAPSULE BY MOUTH THREE TIMES DAILY   sitaGLIPtin 100 MG tablet Commonly known as:  JANUVIA Take 1 tablet (100 mg total) by mouth daily.   sucralfate 1 g tablet Commonly known as:  CARAFATE Take 1 tablet (1 g total) by mouth 4 (four) times daily -  with meals and at bedtime.   traZODone 100 MG tablet Commonly known as:  DESYREL Take 1 tablet (100 mg total) by mouth at bedtime as needed for sleep.   triamcinolone cream 0.1 % Commonly known as:  KENALOG Apply 1 application topically 2 (two) times daily.   vitamin B-12 1000 MCG tablet Commonly known as:  CYANOCOBALAMIN Take 1,000 mcg by mouth daily.        DISCHARGE INSTRUCTIONS:     Follow with primary care physician in 1 week.  If you experience worsening of your admission symptoms, develop shortness of breath, life threatening emergency, suicidal or homicidal thoughts you must seek medical attention immediately by calling 911 or calling your MD immediately  if symptoms less severe.  You Must read complete instructions/literature along with all the possible adverse reactions/side effects for all the Medicines you take and that have been prescribed to you. Take any new Medicines after you have completely understood and accept all the possible adverse reactions/side effects.   Please note  You were cared for by a hospitalist during your hospital stay. If you have any questions about your discharge medications or the care you received while you were in the hospital after you are discharged, you can call the unit and asked to speak with the hospitalist on call if the hospitalist that took care of you is not available. Once you are discharged, your primary care physician will handle any further medical issues. Please note that NO REFILLS for any discharge medications will be authorized once you are discharged, as it is imperative that you return to your primary care physician (or establish a relationship with a primary care physician if you do not have one) for your aftercare needs so that they can reassess your need for medications and monitor your lab values.    Today   CHIEF COMPLAINT:   Chief Complaint  Patient presents with  . Loss of Consciousness  . Chest Pain    HISTORY OF PRESENT ILLNESS:  Edward Schneider  is a 63 y.o. male with a known history of multiple medical problems that will be mentioned below who presented to the emergency room with [facility] of 3 episodes of syncope preceded by headache and dizziness, with associated chest pain before his third episode.  This occurred over the last 24 hours.  He denied any head injuries or other injuries.  He denied any  change in new medications.  He admitted to mild dyspnea without cough or wheezing or fever or chills.  He denied any paresthesias or focal muscle weakness.  He described his chest pain as midsternal sharp pain graded 9/10 in severity with radiation to his left arm.  No tinnitus or vertigo.  No nausea vomiting or abdominal pain or diarrhea or  melena or bright red blood per rectum.  No other bleeding diathesis.  Upon presentation to the emergency room, his blood pressure was 93/68 with otherwise normal vital signs however is dropped as low as 66/43.  He was given 2 L of IV normal saline bolus and his systolic was 161 during my interview.  His EKG showed normal sinus rhythm with a rate of 89.  He had a chest CTA that showed coronary artery calcifications without evidence for PE or aortic dissection or other acute abnormalities.  His labs were remarkable for elevated BUN and creatinine above his previous levels.  His creatinine has gone up from 1.05-2.39 and BUN from 10-20.  His troponin I was less than 0.03 and his CBC showed no leukocytosis of 13 with mild anemia with hemoglobin of 11.6 hematocrit 35.5 impaired to 13.6 and 42.3 on 04/27/2018.  His blood glucose was 227.  Urinalysis showed more than 500 glucose but otherwise unremarkable.  The patient was given 2 L bolus of IV normal saline.  He will be admitted to an observation telemetry bed for further evaluation and management.   VITAL SIGNS:  Blood pressure 116/74, pulse 60, temperature 98.4 F (36.9 C), resp. rate 18, height 5' 8"  (1.727 m), weight 101.2 kg, SpO2 97 %.  I/O:    Intake/Output Summary (Last 24 hours) at 01/15/2019 1152 Last data filed at 01/15/2019 0900 Gross per 24 hour  Intake 600 ml  Output 2150 ml  Net -1550 ml    PHYSICAL EXAMINATION:  GENERAL:  63 y.o.-year-old patient lying in the bed with no acute distress.  EYES: Pupils equal, round, reactive to light and accommodation. No scleral icterus. Extraocular muscles intact.   HEENT: Head atraumatic, normocephalic. Oropharynx and nasopharynx clear.  NECK:  Supple, no jugular venous distention. No thyroid enlargement, no tenderness.  LUNGS: Normal breath sounds bilaterally, no wheezing, rales,rhonchi or crepitation. No use of accessory muscles of respiration.  CARDIOVASCULAR: S1, S2 normal. No murmurs, rubs, or gallops.  ABDOMEN: Soft, non-tender, non-distended. Bowel sounds present. No organomegaly or mass.  EXTREMITIES: No pedal edema, cyanosis, or clubbing.  NEUROLOGIC: Cranial nerves II through XII are intact. Muscle strength 5/5 in all extremities. Sensation intact. Gait not checked.  PSYCHIATRIC: The patient is alert and oriented x 3.  SKIN: No obvious rash, lesion, or ulcer.   DATA REVIEW:   CBC Recent Labs  Lab 01/14/19 0435  WBC 9.1  HGB 10.7*  HCT 33.6*  PLT 175    Chemistries  Recent Labs  Lab 01/13/19 2205  01/15/19 0450  NA 142   < > 138  K 2.2*   < > 4.5  CL 125*   < > 106  CO2 13*   < > 25  GLUCOSE 227*   < > 212*  BUN 12   < > 16  CREATININE 1.29*   < > 0.98  CALCIUM 4.7*   < > 9.2  AST 21  --   --   ALT 20  --   --   ALKPHOS 32*  --   --   BILITOT 0.4  --   --    < > = values in this interval not displayed.    Cardiac Enzymes Recent Labs  Lab 01/14/19 0911  TROPONINI <0.03    Microbiology Results  Results for orders placed or performed in visit on 05/04/17  Microscopic Examination     Status: None   Collection Time: 05/04/17 11:05 AM  Result Value Ref Range Status  WBC, UA 0-5 0 - 5 /hpf Final   RBC, UA 0-2 0 - 2 /hpf Final   Epithelial Cells (non renal) 0-10 0 - 10 /hpf Final    RADIOLOGY:  Ct Head Wo Contrast  Result Date: 01/14/2019 CLINICAL DATA:  Intermittent dizziness, syncope EXAM: CT HEAD WITHOUT CONTRAST TECHNIQUE: Contiguous axial images were obtained from the base of the skull through the vertex without intravenous contrast. COMPARISON:  None. FINDINGS: Brain: No evidence of acute infarction,  hemorrhage, hydrocephalus, extra-axial collection or mass lesion/mass effect. Vascular: No hyperdense vessel or unexpected calcification. Skull: Normal. Negative for fracture or focal lesion. Sinuses/Orbits: The visualized paranasal sinuses are essentially clear. The mastoid air cells are unopacified. Other: None. IMPRESSION: Normal head CT. Electronically Signed   By: Julian Hy M.D.   On: 01/14/2019 08:12   Ct Angio Chest Pe W And/or Wo Contrast  Result Date: 01/13/2019 CLINICAL DATA:  Chest pain, PE suspected, high prob. Syncope. EXAM: CT ANGIOGRAPHY CHEST WITH CONTRAST TECHNIQUE: Multidetector CT imaging of the chest was performed using the standard protocol during bolus administration of intravenous contrast. Multiplanar CT image reconstructions and MIPs were obtained to evaluate the vascular anatomy. CONTRAST:  26m OMNIPAQUE IOHEXOL 350 MG/ML SOLN COMPARISON:  Chest radiograph 12/16/2017. Chest CT 09/22/2017 FINDINGS: Cardiovascular: There are no filling defects within the pulmonary arteries to the proximal segmental level to suggest pulmonary embolus. More distal the thoracic aorta is normal in caliber without dissection. Minimal atherosclerosis. Heart size upper normal. Coronary artery calcifications. No pericardial effusion. Segmental and subsegmental branches are not well assessed given phase of contrast administration. Mediastinum/Nodes: Small bilateral hilar nodes are not enlarged by size criteria. No mediastinal adenopathy. The esophagus is decompressed. No visualized thyroid nodule. Lungs/Pleura: No focal airspace disease. No pulmonary edema or pleural effusion. Small nodules in the right lung represents peri fissural thickening on reformats, unchanged from prior. Previous left lower lobe pulmonary nodule is no longer visualized. No new pulmonary nodules. Upper Abdomen: Prominent size liver with steatosis. Shotty periportal nodes are likely reactive. No acute findings. Musculoskeletal:  Stable bone island in right anterior rib. Remote anterior right rib fractures. No acute fracture or acute osseous abnormality. Review of the MIP images confirms the above findings. IMPRESSION: 1. No central pulmonary embolus or acute intrathoracic abnormality. 2. Coronary artery calcifications. Electronically Signed   By: MKeith RakeM.D.   On: 01/13/2019 23:17   Mr Brain Wo Contrast  Result Date: 01/14/2019 CLINICAL DATA:  Multiple episodes of syncope. These were preceded by dizziness and headache. EXAM: MRI HEAD WITHOUT CONTRAST TECHNIQUE: Multiplanar, multiecho pulse sequences of the brain and surrounding structures were obtained without intravenous contrast. COMPARISON:  01/14/2019. FINDINGS: Brain: No evidence for acute infarction, hemorrhage, mass lesion, hydrocephalus, or extra-axial fluid. Normal for age cerebral volume. Mild subcortical and periventricular T2 and FLAIR hyperintensities, likely chronic microvascular ischemic change. Vascular: Flow voids are maintained throughout the carotid, basilar, and vertebral arteries. There are no areas of chronic hemorrhage. Skull and upper cervical spine: Unremarkable visualized calvarium, skullbase, and cervical vertebrae. Pituitary, pineal, cerebellar tonsils unremarkable. No upper cervical cord lesions. Sinuses/Orbits: Mild chronic sinus disease.  Negative orbits. Other: No mastoid fluid.  Extracranial soft tissues unremarkable. IMPRESSION: Normal for age cerebral volume.  Mild small vessel disease. No acute intracranial findings are evident. There is no large vessel occlusion. Electronically Signed   By: JStaci RighterM.D.   On: 01/14/2019 11:34    EKG:   Orders placed or performed during the hospital encounter of  01/13/19  . EKG 12-Lead  . EKG 12-Lead      Management plans discussed with the patient, family and they are in agreement.  CODE STATUS:     Code Status Orders  (From admission, onward)         Start     Ordered    01/14/19 0201  Full code  Continuous     01/14/19 0207        Code Status History    Date Active Date Inactive Code Status Order ID Comments User Context   04/04/2017 0257 04/05/2017 2051 Full Code [id number]  Lance Coon, MD Inpatient   12/15/2015 1740 12/17/2015 1901 Full Code [id number]  Loletha Grayer, MD ED      TOTAL TIME TAKING CARE OF THIS PATIENT: 35 minutes.    Vaughan Basta M.D on 01/15/2019 at 11:52 AM  Between 7am to 6pm - Pager - [phone]  After 6pm go to [email or web address] - password [email or web address] [facility]  Office  [phone]  CC: Primary care physician; Guadalupe Maple, MD   Note: This dictation was prepared with Dragon dictation along with smaller phrase technology. Any transcriptional errors that result from this process are unintentional.

## 2019-01-15 NOTE — Progress Notes (Signed)
Pt to be discharged this afternoon. Iv and tele removed. disch instructions given to pt to his understanding. disch via w.c. wife will transport.

## 2019-01-18 ENCOUNTER — Ambulatory Visit (INDEPENDENT_AMBULATORY_CARE_PROVIDER_SITE_OTHER): Payer: Medicare Other | Admitting: Family Medicine

## 2019-01-18 ENCOUNTER — Other Ambulatory Visit: Payer: Self-pay

## 2019-01-18 ENCOUNTER — Encounter: Payer: Self-pay | Admitting: Family Medicine

## 2019-01-18 DIAGNOSIS — E1142 Type 2 diabetes mellitus with diabetic polyneuropathy: Secondary | ICD-10-CM | POA: Diagnosis not present

## 2019-01-18 DIAGNOSIS — I10 Essential (primary) hypertension: Secondary | ICD-10-CM | POA: Diagnosis not present

## 2019-01-18 DIAGNOSIS — R55 Syncope and collapse: Secondary | ICD-10-CM

## 2019-01-18 NOTE — Assessment & Plan Note (Signed)
Resolved with hydration and patient feeling better has recovered

## 2019-01-18 NOTE — Assessment & Plan Note (Signed)
Ongoing poor control of diabetes due to no-shows not available for further evaluation at [facility] clinic but this was due to deaths in his family will now refer to [facility] endocrinology.

## 2019-01-18 NOTE — Progress Notes (Signed)
Ht 5' 8"  (1.727 m)   Wt 221 lb (100.2 kg)   BMI 33.60 kg/m    Subjective:    Patient ID: Edward Schneider., male    DOB: 20-Jul-1956, 63 y.o.   MRN: [id number]  HPI: [name]. is a 63 y.o. male  Telemedicine using audio and telecommunications for a synchronous communication visit. Today's visit due to COVID-19 isolation precautions I connected with [name] verified that I am speaking with the correct person using two identifiers.   I discussed the limitations, risks, security and privacy concerns of performing an evaluation and management service by telephone and the availability of in person appointments. I also discussed with the patient that there may be a patient responsible charge related to this service. The patient expressed understanding and agreed to proceed. The patient's location is home. I am at home. Patient follow-up from hospital reviewed chart for diabetes care patient has been a no-show and [facility] 3 times in the a.m. discharged him as a potential patient.  Patient is gotten other problems out of the way and is now ready to go to endocrinology.  Discussed will send to endocrinology in [place]. Reviewed patient's hospitalization with hypotension and patient had been in the heat and drinking Gatorade reviewed that is not an appropriate drink especially with diabetes.  Patient is to drink water and enough water so his urine is clear. Patient denies any hypotensive symptoms.  Is unable to check his blood pressure but no lightheaded dizzy or feeling weak type episodes. Transition of Care Hospital Follow up.   Hospital/Facility:[facility] D/C Physician: Anselm Jungling D/C Date:01-15-19   Records Requested:  na Records Received: na Records Reviewed: today  Diagnoses on Discharge: Orthostatic hypotension dehydration  Date of interactive Contact within 48 hours of discharge:  Contact was through: phone  Date of 7 day or 14 day face-to-face visit:     Today 01-18-19  Outpatient Encounter Medications as of 01/18/2019  Medication Sig  . ACCU-CHEK AVIVA PLUS test strip USE TEST STRIP(S) TO CHECK GLUCOSE ONCE DAILY  . ACCU-CHEK SOFTCLIX LANCETS lancets USE TO CHECK GLUCOSE ONCE DAILY  . Ascorbic Acid (VITAMIN C) 1000 MG tablet Take 1,000 mg by mouth daily.  Marland Kitchen aspirin EC 81 MG tablet Take 162 mg by mouth daily.   Marland Kitchen atorvastatin (LIPITOR) 20 MG tablet Take 1 tablet (20 mg total) by mouth daily.  . Blood Glucose Monitoring Suppl (ACCU-CHEK AVIVA PLUS) w/Device KIT USE   TO CHECK GLUCOSE ONCE DAILY  . Cholecalciferol (VITAMIN D3) 125 MCG (5000 UT) CAPS Take by mouth.  Marland Kitchen CINNAMON PO Take 2,400 mg by mouth daily.  . cyclobenzaprine (FLEXERIL) 10 MG tablet Take 1 tablet (10 mg total) by mouth 3 (three) times daily as needed for muscle spasms.  . diclofenac sodium (VOLTAREN) 1 % GEL Apply 2 g topically 4 (four) times daily.  . diphenhydrAMINE (BENADRYL) 25 mg capsule Take 25 mg by mouth 2 (two) times daily.   . diphenoxylate-atropine (LOMOTIL) 2.5-0.025 MG tablet Take 1 tablet by mouth 4 (four) times daily as needed for diarrhea or loose stools.  . doxepin (SINEQUAN) 25 MG capsule TAKE 2 CAPSULES BY MOUTH ONCE DAILY AT NIGHT AT BEDTIME  . DULoxetine (CYMBALTA) 60 MG capsule Take 1 capsule (60 mg total) by mouth daily.  . Ginger, Zingiber officinalis, (GINGER PO) Take 1 Dose by mouth daily.  . Ginseng 100 MG CAPS Take by mouth.  . metFORMIN (GLUCOPHAGE) 500 MG tablet Take 2  tablets (1,000 mg total) by mouth 2 (two) times daily with a meal.  . mupirocin ointment (BACTROBAN) 2 % APPLY OINTMENT TOPICALLY TWICE DAILY  . Omega-3 1000 MG CAPS Take by mouth.  . oxyCODONE (ROXICODONE) 15 MG immediate release tablet TAKE 1 2 TO 1 (ONE HALF TO ONE) TABLET BY MOUTH FOUR TO SIX TIMES DAILY IF TOLERTED NOTE TABLET IS 15 MG  . pantoprazole (PROTONIX) 40 MG tablet Take 1 tablet (40 mg total) by mouth 2 (two) times daily.  . pregabalin (LYRICA) 50 MG capsule TAKE 1  CAPSULE BY MOUTH THREE TIMES DAILY  . sitaGLIPtin (JANUVIA) 100 MG tablet Take 1 tablet (100 mg total) by mouth daily.  . sucralfate (CARAFATE) 1 g tablet Take 1 tablet (1 g total) by mouth 4 (four) times daily -  with meals and at bedtime.  . traZODone (DESYREL) 100 MG tablet Take 1 tablet (100 mg total) by mouth at bedtime as needed for sleep.  Marland Kitchen triamcinolone cream (KENALOG) 0.1 % Apply 1 application topically 2 (two) times daily.  . vitamin A 10000 UNIT capsule Take 10,000 Units by mouth daily.  . vitamin B-12 (CYANOCOBALAMIN) 1000 MCG tablet Take 1,000 mcg by mouth daily.  . vitamin E 1000 UNIT capsule Take 1,000 Units by mouth daily.   No facility-administered encounter medications on file as of 01/18/2019.     Diagnostic Tests Reviewed/Disposition: done  Consults:cardiology  Discharge Instructions reviewed with pt via  [place] Discussions/Referrals:na  Establishment or re-establishment of referral orders for community resources:na  Discussion with other health care providers: na  Assessment and Support of treatment regimen adherence:yes  Appointments Coordinated with:   Education for self-management, independent living, and ADLs:   Relevant past medical, surgical, family and social history reviewed and updated as indicated. Interim medical history since our last visit reviewed. Allergies and medications reviewed and updated.  Review of Systems  Constitutional: Negative.   Respiratory: Negative.   Cardiovascular: Negative.     Per HPI unless specifically indicated above     Objective:    Ht 5' 8"  (1.727 m)   Wt 221 lb (100.2 kg)   BMI 33.60 kg/m   Wt Readings from Last 3 Encounters:  01/18/19 221 lb (100.2 kg)  01/15/19 223 lb 1.7 oz (101.2 kg)  09/20/18 228 lb 6.4 oz (103.6 kg)    Physical Exam none Results for orders placed or performed during the hospital encounter of 01/13/19  Glucose, capillary   Result Value Ref Range   Glucose-Capillary 450 (H) 70 - 99 mg/dL  CBC  Result Value Ref Range   WBC 13.0 (H) 4.0 - 10.5 K/uL   RBC 4.06 (L) 4.22 - 5.81 MIL/uL   Hemoglobin 11.6 (L) 13.0 - 17.0 g/dL   HCT 35.5 (L) 39.0 - 52.0 %   MCV 87.4 80.0 - 100.0 fL   MCH 28.6 26.0 - 34.0 pg   MCHC 32.7 30.0 - 36.0 g/dL   RDW 13.0 11.5 - 15.5 %   Platelets 203 150 - 400 K/uL   nRBC 0.0 0.0 - 0.2 %  Urinalysis, Complete w Microscopic  Result Value Ref Range   Color, Urine YELLOW (A) YELLOW   APPearance HAZY (A) CLEAR   Specific Gravity, Urine 1.018 1.005 - 1.030   pH 5.0 5.0 - 8.0   Glucose, UA >=500 (A) NEGATIVE mg/dL   Hgb urine dipstick NEGATIVE NEGATIVE   Bilirubin Urine NEGATIVE NEGATIVE   Ketones, ur NEGATIVE NEGATIVE mg/dL  Protein, ur NEGATIVE NEGATIVE mg/dL   Nitrite NEGATIVE NEGATIVE   Leukocytes,Ua NEGATIVE NEGATIVE   RBC / HPF 0-5 0 - 5 RBC/hpf   WBC, UA 0-5 0 - 5 WBC/hpf   Bacteria, UA NONE SEEN NONE SEEN   Squamous Epithelial / LPF 0-5 0 - 5   Mucus PRESENT    Hyaline Casts, UA PRESENT   Comprehensive metabolic panel  Result Value Ref Range   Sodium 142 135 - 145 mmol/L   Potassium 2.2 (LL) 3.5 - 5.1 mmol/L   Chloride 125 (H) 98 - 111 mmol/L   CO2 13 (L) 22 - 32 mmol/L   Glucose, Bld 227 (H) 70 - 99 mg/dL   BUN 12 8 - 23 mg/dL   Creatinine, Ser 1.29 (H) 0.61 - 1.24 mg/dL   Calcium 4.7 (LL) 8.9 - 10.3 mg/dL   Total Protein 3.1 (L) 6.5 - 8.1 g/dL   Albumin 1.8 (L) 3.5 - 5.0 g/dL   AST 21 15 - 41 U/L   ALT 20 0 - 44 U/L   Alkaline Phosphatase 32 (L) 38 - 126 U/L   Total Bilirubin 0.4 0.3 - 1.2 mg/dL   GFR calc non Af Amer 59 (L) >60 mL/min   GFR calc Af Amer >60 >60 mL/min   Anion gap 4 (L) 5 - 15  Troponin I -  Result Value Ref Range   Troponin I <0.03 <0.03 ng/mL  Basic metabolic panel  Result Value Ref Range   Sodium 132 (L) 135 - 145 mmol/L   Potassium 4.3 3.5 - 5.1 mmol/L   Chloride 100 98 - 111 mmol/L   CO2 23 22 - 32 mmol/L   Glucose, Bld 346 (H) 70  - 99 mg/dL   BUN 20 8 - 23 mg/dL   Creatinine, Ser 2.39 (H) 0.61 - 1.24 mg/dL   Calcium 8.8 (L) 8.9 - 10.3 mg/dL   GFR calc non Af Amer 28 (L) >60 mL/min   GFR calc Af Amer 33 (L) >60 mL/min   Anion gap 9 5 - 15  CK  Result Value Ref Range   Total CK 146 49 - 397 U/L  Basic metabolic panel  Result Value Ref Range   Sodium 136 135 - 145 mmol/L   Potassium 4.2 3.5 - 5.1 mmol/L   Chloride 107 98 - 111 mmol/L   CO2 23 22 - 32 mmol/L   Glucose, Bld 263 (H) 70 - 99 mg/dL   BUN 20 8 - 23 mg/dL   Creatinine, Ser 1.92 (H) 0.61 - 1.24 mg/dL   Calcium 8.3 (L) 8.9 - 10.3 mg/dL   GFR calc non Af Amer 37 (L) >60 mL/min   GFR calc Af Amer 43 (L) >60 mL/min   Anion gap 6 5 - 15  CBC  Result Value Ref Range   WBC 9.1 4.0 - 10.5 K/uL   RBC 3.75 (L) 4.22 - 5.81 MIL/uL   Hemoglobin 10.7 (L) 13.0 - 17.0 g/dL   HCT 33.6 (L) 39.0 - 52.0 %   MCV 89.6 80.0 - 100.0 fL   MCH 28.5 26.0 - 34.0 pg   MCHC 31.8 30.0 - 36.0 g/dL   RDW 13.0 11.5 - 15.5 %   Platelets 175 150 - 400 K/uL   nRBC 0.0 0.0 - 0.2 %  Glucose, capillary  Result Value Ref Range   Glucose-Capillary 227 (H) 70 - 99 mg/dL  Troponin I - Once  Result Value Ref Range   Troponin I <0.03 <0.03 ng/mL  Glucose, capillary  Result Value Ref Range   Glucose-Capillary 249 (H) 70 - 99 mg/dL  Glucose, capillary  Result Value Ref Range   Glucose-Capillary 294 (H) 70 - 99 mg/dL  Basic metabolic panel  Result Value Ref Range   Sodium 138 135 - 145 mmol/L   Potassium 4.5 3.5 - 5.1 mmol/L   Chloride 106 98 - 111 mmol/L   CO2 25 22 - 32 mmol/L   Glucose, Bld 212 (H) 70 - 99 mg/dL   BUN 16 8 - 23 mg/dL   Creatinine, Ser 0.98 0.61 - 1.24 mg/dL   Calcium 9.2 8.9 - 10.3 mg/dL   GFR calc non Af Amer >60 >60 mL/min   GFR calc Af Amer >60 >60 mL/min   Anion gap 7 5 - 15  Glucose, capillary  Result Value Ref Range   Glucose-Capillary 218 (H) 70 - 99 mg/dL  Glucose, capillary  Result Value Ref Range   Glucose-Capillary 211 (H) 70 - 99 mg/dL   ECHOCARDIOGRAM COMPLETE  Result Value Ref Range   Weight 3,616 oz   Height 68 in   BP 104/88 mmHg      Assessment & Plan:   Problem List Items Addressed This Visit      Cardiovascular and Mediastinum   Essential hypertension    The current medical regimen is effective;  continue present plan and medications.       Syncope    Resolved with hydration and patient feeling better has recovered        Nervous and Auditory   DM type 2 with diabetic peripheral neuropathy ([place])    Ongoing poor control of diabetes due to no-shows not available for further evaluation at [facility] clinic but this was due to deaths in his family will now refer to [facility] endocrinology.      Relevant Orders   Ambulatory referral to Endocrinology      I discussed the assessment and treatment plan with the patient. The patient was provided an opportunity to ask questions and all were answered. The patient agreed with the plan and demonstrated an understanding of the instructions.   The patient was advised to call back or seek an in-person evaluation if the symptoms worsen or if the condition fails to improve as anticipated.   I provided 21+ minutes of time during this encounter. Follow up plan: Return in about 3 months (around 04/19/2019) for Physical Exam, Hemoglobin A1c.

## 2019-01-18 NOTE — Assessment & Plan Note (Signed)
The current medical regimen is effective;  continue present plan and medications.  

## 2019-01-23 ENCOUNTER — Other Ambulatory Visit: Payer: Self-pay

## 2019-01-23 DIAGNOSIS — G894 Chronic pain syndrome: Secondary | ICD-10-CM | POA: Diagnosis not present

## 2019-01-24 ENCOUNTER — Encounter: Payer: Self-pay | Admitting: Internal Medicine

## 2019-01-24 ENCOUNTER — Ambulatory Visit (INDEPENDENT_AMBULATORY_CARE_PROVIDER_SITE_OTHER): Payer: Medicare Other | Admitting: Internal Medicine

## 2019-01-24 ENCOUNTER — Other Ambulatory Visit: Payer: Self-pay

## 2019-01-24 VITALS — BP 124/78 | HR 65 | Temp 97.7°F | Ht 68.0 in | Wt 226.0 lb

## 2019-01-24 DIAGNOSIS — E1159 Type 2 diabetes mellitus with other circulatory complications: Secondary | ICD-10-CM

## 2019-01-24 DIAGNOSIS — E1165 Type 2 diabetes mellitus with hyperglycemia: Secondary | ICD-10-CM | POA: Diagnosis not present

## 2019-01-24 DIAGNOSIS — E1142 Type 2 diabetes mellitus with diabetic polyneuropathy: Secondary | ICD-10-CM | POA: Diagnosis not present

## 2019-01-24 DIAGNOSIS — E11641 Type 2 diabetes mellitus with hypoglycemia with coma: Secondary | ICD-10-CM | POA: Insufficient documentation

## 2019-01-24 LAB — POCT GLYCOSYLATED HEMOGLOBIN (HGB A1C): Hemoglobin A1C: 10.9 % — AB (ref 4.0–5.6)

## 2019-01-24 MED ORDER — INSULIN PEN NEEDLE 31G X 5 MM MISC: 0 refills | Status: DC

## 2019-01-24 MED ORDER — BASAGLAR KWIKPEN 100 UNIT/ML ~~LOC~~ SOPN: 20.0000 [IU] | PEN_INJECTOR | Freq: Every day | SUBCUTANEOUS | 3 refills | Status: DC

## 2019-01-24 NOTE — Patient Instructions (Signed)
-   Continue Metformin 500 mg two tablets twice a day  - Start Basaglar 20 units daily  - Check sugar twice a day (fasting and bedtime )    HOW TO TREAT LOW BLOOD SUGARS (Blood sugar LESS THAN 70 MG/DL)  Please follow the RULE OF 15 for the treatment of hypoglycemia treatment (when your (blood sugars are less than 70 mg/dL)    STEP 1: Take 15 grams of carbohydrates when your blood sugar is low, which includes:   3-4 GLUCOSE TABS  OR  3-4 OZ OF JUICE OR REGULAR SODA OR  ONE TUBE OF GLUCOSE GEL     STEP 2: RECHECK blood sugar in 15 MINUTES STEP 3: If your blood sugar is still low at the 15 minute recheck --> then, go back to STEP 1 and treat AGAIN with another 15 grams of carbohydrates.
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
## 2019-01-25 ENCOUNTER — Telehealth: Payer: Self-pay | Admitting: Internal Medicine

## 2019-01-25 LAB — BASIC METABOLIC PANEL
BUN: 18 mg/dL (ref 6–23)
CO2: 23 mEq/L (ref 19–32)
Calcium: 9.8 mg/dL (ref 8.4–10.5)
Chloride: 98 mEq/L (ref 96–112)
Creatinine, Ser: 1.12 mg/dL (ref 0.40–1.50)
GFR: 66.47 mL/min (ref >60.00)
Glucose, Bld: 248 mg/dL — ABNORMAL HIGH (ref 70–99)
Potassium: 4.8 mEq/L (ref 3.5–5.1)
Sodium: 133 mEq/L — ABNORMAL LOW (ref 135–145)

## 2019-01-25 NOTE — Telephone Encounter (Signed)
Patient sated he was prescribed insulin yesterday at his appointment and that his insurance does not cover the [employer]. The patient said [facility] told him they reached out to our office for Dr [facility] to send in a different insulin but has not heard from our office.

## 2019-01-25 NOTE — Telephone Encounter (Signed)
Informed pt to call insurance company and find out what is covered and call us back.

## 2019-01-26 ENCOUNTER — Encounter: Payer: Self-pay | Admitting: Internal Medicine

## 2019-01-26 ENCOUNTER — Telehealth: Payer: Self-pay | Admitting: Internal Medicine

## 2019-01-26 MED ORDER — INSULIN DEGLUDEC 100 UNIT/ML ~~LOC~~ SOPN: 20.0000 [IU] | PEN_INJECTOR | Freq: Every day | SUBCUTANEOUS | 11 refills | Status: DC

## 2019-01-26 NOTE — Telephone Encounter (Signed)
Pt was informed to contact insurance and call me back

## 2019-01-26 NOTE — Telephone Encounter (Signed)
Patient is calling back to discuss his insulin. He has spoke with insurance and would like a call back to pass the information they gave him along to dr [facility]

## 2019-01-26 NOTE — Telephone Encounter (Signed)
Pt informed

## 2019-01-28 DIAGNOSIS — G4733 Obstructive sleep apnea (adult) (pediatric): Secondary | ICD-10-CM | POA: Diagnosis not present

## 2019-02-20 DIAGNOSIS — G894 Chronic pain syndrome: Secondary | ICD-10-CM | POA: Diagnosis not present

## 2019-02-20 DIAGNOSIS — G629 Polyneuropathy, unspecified: Secondary | ICD-10-CM | POA: Diagnosis not present

## 2019-02-20 DIAGNOSIS — Z5181 Encounter for therapeutic drug level monitoring: Secondary | ICD-10-CM | POA: Diagnosis not present

## 2019-02-20 DIAGNOSIS — E11610 Type 2 diabetes mellitus with diabetic neuropathic arthropathy: Secondary | ICD-10-CM | POA: Diagnosis not present

## 2019-02-20 DIAGNOSIS — M179 Osteoarthritis of knee, unspecified: Secondary | ICD-10-CM | POA: Diagnosis not present

## 2019-02-27 DIAGNOSIS — G4733 Obstructive sleep apnea (adult) (pediatric): Secondary | ICD-10-CM | POA: Diagnosis not present

## 2019-03-14 DIAGNOSIS — G4733 Obstructive sleep apnea (adult) (pediatric): Secondary | ICD-10-CM | POA: Diagnosis not present

## 2019-03-20 DIAGNOSIS — M4807 Spinal stenosis, lumbosacral region: Secondary | ICD-10-CM | POA: Diagnosis not present

## 2019-03-20 DIAGNOSIS — E11610 Type 2 diabetes mellitus with diabetic neuropathic arthropathy: Secondary | ICD-10-CM | POA: Diagnosis not present

## 2019-03-20 DIAGNOSIS — Z5181 Encounter for therapeutic drug level monitoring: Secondary | ICD-10-CM | POA: Diagnosis not present

## 2019-03-20 DIAGNOSIS — M792 Neuralgia and neuritis, unspecified: Secondary | ICD-10-CM | POA: Diagnosis not present

## 2019-03-20 DIAGNOSIS — M48062 Spinal stenosis, lumbar region with neurogenic claudication: Secondary | ICD-10-CM | POA: Diagnosis not present

## 2019-03-20 DIAGNOSIS — M179 Osteoarthritis of knee, unspecified: Secondary | ICD-10-CM | POA: Diagnosis not present

## 2019-03-20 DIAGNOSIS — M47897 Other spondylosis, lumbosacral region: Secondary | ICD-10-CM | POA: Diagnosis not present

## 2019-03-20 DIAGNOSIS — G629 Polyneuropathy, unspecified: Secondary | ICD-10-CM | POA: Diagnosis not present

## 2019-03-20 DIAGNOSIS — G8929 Other chronic pain: Secondary | ICD-10-CM | POA: Diagnosis not present

## 2019-03-30 ENCOUNTER — Telehealth: Payer: Self-pay | Admitting: Family Medicine

## 2019-03-30 NOTE — Chronic Care Management (AMB) (Signed)
Chronic Care Management  ° °Note ° °03/30/2019 °Name: Edward C Kistler Jr. MRN: [id number] DOB: 02/27/1956 ° °Edward C Odonoghue Jr. is a 63 y.o. year old male who is a primary care patient of Crissman, Mark A, MD. I reached out to Caven C Zavada Jr. by phone today in response to a referral sent by Mr. Garvey C Utsey Jr.'s health plan.   ° °Edward Schneider was given information about Chronic Care Management services today including:  °1. CCM service includes personalized support from designated clinical staff supervised by his physician, including individualized plan of care and coordination with other care providers °2. 24/7 contact phone numbers for assistance for urgent and routine care needs. °3. Service will only be billed when office clinical staff spend 20 minutes or more in a month to coordinate care. °4. Only one practitioner may furnish and bill the service in a calendar month. °5. The patient may stop CCM services at any time (effective at the end of the month) by phone call to the office staff. °6. The patient will be responsible for cost sharing (co-pay) of up to 20% of the service fee (after annual deductible is met). ° °Patient agreed to services and verbal consent obtained.  ° °Follow up plan: °Telephone appointment with CCM team member scheduled for: 04/07/2019 ° °Bernice Cicero °Care Guide • [facility] °[place]   [facility]  °??[email or web address]   ??[phone]   ° ° ° °

## 2019-04-07 ENCOUNTER — Telehealth: Payer: Medicare Other

## 2019-04-07 ENCOUNTER — Ambulatory Visit: Payer: Self-pay | Admitting: Pharmacist

## 2019-04-07 NOTE — Chronic Care Management (AMB) (Signed)
  Chronic Care Management   Note  04/07/2019 Name: Edward Schneider. MRN: [id number] DOB: 1955-11-03  [name] is a 63 y.o. year old male who is a primary care patient of Crissman, Jeannette How, MD. The CCM team was consulted for assistance with chronic disease management and care coordination needs.    Received referral from patient's insurance company due to being high risk. Contacted patient to discuss chronic care management service, left HIPAA compliant message for patient to return my call at his convenience.   Follow up plan: - If I do not hear back, will follow up in 5-10 business days   Catie Darnelle Maffucci, PharmD Clinical Pharmacist [place] [phone]

## 2019-04-11 ENCOUNTER — Telehealth: Payer: Self-pay

## 2019-04-11 ENCOUNTER — Ambulatory Visit: Payer: Self-pay | Admitting: Pharmacist

## 2019-04-11 NOTE — Chronic Care Management (AMB) (Signed)
  Chronic Care Management   Note  04/11/2019 Name: Edward Schneider. MRN: [id number] DOB: 1956/09/02  Edward Schneider. is a 63 y.o. year old male who is a primary care patient of Crissman, Jeannette How, MD. The CCM team was consulted for assistance with chronic disease management and care coordination needs.    Contacted patient as previously scheduled to discuss chronic care management team. Left HIPAA compliant message for patient to return my call at his convenience.   Follow up plan: - If I do not hear back from the patient, will follow up in 5-7 business days  Catie Darnelle Maffucci, PharmD Clinical Pharmacist [place] [phone]

## 2019-04-12 ENCOUNTER — Telehealth: Payer: Self-pay

## 2019-04-12 ENCOUNTER — Ambulatory Visit: Payer: Self-pay | Admitting: Pharmacist

## 2019-04-12 NOTE — Chronic Care Management (AMB) (Signed)
  Chronic Care Management   Note  04/12/2019 Name: Json Koelzer. MRN: [id number] DOB: 24-Jul-1956  [name] is a 63 y.o. year old male who is a primary care patient of Crissman, Jeannette How, MD. The CCM team was consulted for assistance with chronic disease management and care coordination needs.    Attempted to contact patient regarding chronic care management team. Left HIPAA compliant message for patient to return my call at his convenience.   Follow up plan: - If I do not hear back from patient, will follow up in the next 2-3 weeks  Catie Darnelle Maffucci, PharmD Clinical Pharmacist [place] [phone]

## 2019-04-17 DIAGNOSIS — M179 Osteoarthritis of knee, unspecified: Secondary | ICD-10-CM | POA: Diagnosis not present

## 2019-04-17 DIAGNOSIS — E11610 Type 2 diabetes mellitus with diabetic neuropathic arthropathy: Secondary | ICD-10-CM | POA: Diagnosis not present

## 2019-04-17 DIAGNOSIS — Z5181 Encounter for therapeutic drug level monitoring: Secondary | ICD-10-CM | POA: Diagnosis not present

## 2019-04-17 DIAGNOSIS — G629 Polyneuropathy, unspecified: Secondary | ICD-10-CM | POA: Diagnosis not present

## 2019-04-25 ENCOUNTER — Encounter: Payer: Self-pay | Admitting: Internal Medicine

## 2019-04-25 ENCOUNTER — Ambulatory Visit (INDEPENDENT_AMBULATORY_CARE_PROVIDER_SITE_OTHER): Payer: Medicare Other | Admitting: Internal Medicine

## 2019-04-25 ENCOUNTER — Other Ambulatory Visit: Payer: Self-pay

## 2019-04-25 VITALS — BP 116/68 | HR 81 | Temp 98.0°F | Ht 68.0 in | Wt 227.8 lb

## 2019-04-25 DIAGNOSIS — E1159 Type 2 diabetes mellitus with other circulatory complications: Secondary | ICD-10-CM | POA: Diagnosis not present

## 2019-04-25 DIAGNOSIS — E1165 Type 2 diabetes mellitus with hyperglycemia: Secondary | ICD-10-CM | POA: Diagnosis not present

## 2019-04-25 DIAGNOSIS — E1142 Type 2 diabetes mellitus with diabetic polyneuropathy: Secondary | ICD-10-CM | POA: Diagnosis not present

## 2019-04-25 DIAGNOSIS — E119 Type 2 diabetes mellitus without complications: Secondary | ICD-10-CM | POA: Insufficient documentation

## 2019-04-25 LAB — POCT GLYCOSYLATED HEMOGLOBIN (HGB A1C): Hemoglobin A1C: 11.8 % — AB (ref 4.0–5.6)

## 2019-04-25 MED ORDER — INSULIN LISPRO (1 UNIT DIAL) 100 UNIT/ML (KWIKPEN): 8.0000 [IU] | PEN_INJECTOR | Freq: Three times a day (TID) | SUBCUTANEOUS | 11 refills | Status: DC

## 2019-04-25 MED ORDER — ACCU-CHEK AVIVA PLUS VI STRP: 1.0000 | ORAL_STRIP | 12 refills | Status: DC

## 2019-04-25 MED ORDER — TRESIBA FLEXTOUCH 100 UNIT/ML ~~LOC~~ SOPN: 24.0000 [IU] | PEN_INJECTOR | Freq: Every day | SUBCUTANEOUS | 11 refills | Status: DC

## 2019-04-25 NOTE — Progress Notes (Signed)
Name: Edward Schneider.  Age/ Sex: 63 y.o., male   MRN/ DOB: [id number], [birth year]     PCP: Guadalupe Maple, MD   Reason for Endocrinology Evaluation: Type 2 Diabetes Mellitus  Initial Endocrine Consultative Visit: 01/24/2019    PATIENT IDENTIFIER: Edward Schneider. is a 63 y.o. male with a past medical history of T2DM. The patient has followed with Endocrinology clinic since 01/24/2019 for consultative assistance with management of his diabetes.  DIABETIC HISTORY:  Mr. Cammarata was diagnosed with T2DM in 2017. He has been on Jardiance in 2017 but due to cost was discontinued, as well as [place]. His hemoglobin A1c has ranged from 6.2% in 2018, peaking at 10.0 % in 2019.  On his initial visit to our clinic his A1c 10.9% , he was on metformin   SUBJECTIVE:   During the last visit (01/24/2019): A1c 10.9% we started basal insulin and continued metformin.   Today (04/25/2019): Mr. Deroos is here for a 3 month follow up on his diabetes management.  He checks his blood sugars 2 times daily, preprandial to breakfast and bedtime. The patient has not had hypoglycemic episodes since the last clinic visit. Otherwise, the patient has not required any recent emergency interventions for hypoglycemia and has not had recent hospitalizations secondary to hyper or hypoglycemic episodes.   He used to drink 2-4 liter  Soda a day and now he cut it down to 2 cans a day     ROS: As per HPI and as detailed below: Review of Systems  Constitutional: Negative for fever and weight loss.  HENT: Negative for congestion and sore throat.   Respiratory: Negative for cough and shortness of breath.   Cardiovascular: Negative for chest pain.  Gastrointestinal: Positive for diarrhea. Negative for nausea.       Sometimes associated with diet   Genitourinary: Negative for urgency.  Neurological: Positive for tingling. Negative for tremors.   Endo/Heme/Allergies: Negative for polydipsia.      HOME DIABETES REGIMEN:   Basaglar 20 units QHS  Continue Metformin 500 mg  2 tabs BID    METER DOWNLOAD SUMMARY:  164-400 mg/dL     DIABETIC COMPLICATIONS: Microvascular complications:   Neuropathy   Denies: CKD, retinopathy   Last eye exam: Completed 12/2018  Macrovascular complications:   CAD and CVA  Denies: PVD   HISTORY:  Past Medical History:  Past Medical History:  Diagnosis Date  . Allergy   . Asthma   . C. difficile diarrhea   . Cancer [facility]) June 2016   liver cancer  . Chronic pain   . DDD (degenerative disc disease), cervical   . DDD (degenerative disc disease), lumbar   . Diabetes mellitus without complication ([place])   . GERD (gastroesophageal reflux disease)   . Headache    migraines - none since 02/17  . Hypertension   . Low blood sugar   . Myocardial infarction ([place])   . Seizures ([place])    several as child when sick.  None since age 57  . Stroke [facility])    'mini-stroke" 30 yrs ago. no deficits.  . Wears dentures    full upper and lower   Past Surgical History:  Past Surgical History:  Procedure Laterality Date  . APPENDECTOMY    . BACK SURGERY    . CARDIAC CATHETERIZATION     stent placed in his "30's"  . COLONOSCOPY WITH PROPOFOL N/A 03/06/2016   Procedure: COLONOSCOPY WITH PROPOFOL;  Surgeon: Lucilla Lame,  MD;  Location: [place];  Service: Endoscopy;  Laterality: N/A;  requests early  . ESOPHAGOGASTRODUODENOSCOPY (EGD) WITH PROPOFOL N/A 09/20/2017   Procedure: ESOPHAGOGASTRODUODENOSCOPY (EGD) WITH PROPOFOL;  Surgeon: Lucilla Lame, MD;  Location: [place];  Service: Endoscopy;  Laterality: N/A;  Diabetic - oral meds  . FINGER SURGERY Left   . KNEE SURGERY Right   . NECK SURGERY    . spleen surg    . spleen surgery    . TOE SURGERY Right     Social History:  reports that he quit smoking about 6 years ago. His smoking use included cigarettes. He has a 100.00  pack-year smoking history. He has never used smokeless tobacco. He reports that he does not drink alcohol or use drugs. Family History:  Family History  Problem Relation Age of Onset  . Arthritis Mother   . Diabetes Mother   . Kidney disease Mother   . Heart disease Mother   . Hypertension Mother   . Arthritis Father   . Hearing loss Father   . Hypertension Father   . Diabetes Sister   . Heart disease Sister   . Diabetes Daughter   . Diabetes Maternal Aunt   . Diabetes Maternal Grandmother      HOME MEDICATIONS: Allergies as of 04/25/2019      Reactions   Bee Pollen Anaphylaxis   Died 3 times when stung by bees. Carries Epi-pen at all times. Died 3 times when stung by bees. Carries Epi-pen at all times.   Bee Venom Anaphylaxis   Crestor [rosuvastatin Calcium] Shortness Of Breath, Swelling   Fentanyl Itching, Hives   blisters Patch   Shellfish Allergy Anaphylaxis, Swelling   Shrimp causes throat to swell and tingling in tongue. Can eat other white fish, crabcakes, and oysters. Shrimp causes throat to swell and tingling in tongue. Can eat other white fish, crabcakes, and oysters.   Gabapentin Diarrhea   Severe diarrhea which caused incontinence, loss of appetite and weight loss.  Severe diarrhea which caused incontinence, loss of appetite and weight loss.  Severe diarrhea which caused incontinence, loss of appetite and weight loss.  Severe diarrhea which caused incontinence, loss of appetite and weight loss.    Morphine Itching   Morphine And Related Itching   Simvastatin Diarrhea   Buprenorphine Hcl Itching      Medication List       Accurate as of April 25, 2019  1:57 PM. If you have any questions, ask your nurse or doctor.        Accu-Chek Aviva Plus test strip Generic drug: glucose blood USE TEST STRIP(S) TO CHECK GLUCOSE ONCE DAILY   Accu-Chek Aviva Plus w/Device Kit USE   TO CHECK GLUCOSE ONCE DAILY   Accu-Chek Softclix Lancets lancets USE TO CHECK GLUCOSE  ONCE DAILY   aspirin EC 81 MG tablet Take 162 mg by mouth daily.   atorvastatin 20 MG tablet Commonly known as: LIPITOR Take 1 tablet (20 mg total) by mouth daily.   CINNAMON PO Take 2,400 mg by mouth daily.   cyclobenzaprine 10 MG tablet Commonly known as: FLEXERIL Take 1 tablet (10 mg total) by mouth 3 (three) times daily as needed for muscle spasms.   diclofenac sodium 1 % Gel Commonly known as: VOLTAREN Apply 2 g topically 4 (four) times daily.   diphenhydrAMINE 25 mg capsule Commonly known as: BENADRYL Take 25 mg by mouth 2 (two) times daily.   diphenoxylate-atropine 2.5-0.025 MG tablet Commonly known as: Lomotil  Take 1 tablet by mouth 4 (four) times daily as needed for diarrhea or loose stools.   doxepin 25 MG capsule Commonly known as: SINEQUAN TAKE 2 CAPSULES BY MOUTH ONCE DAILY AT NIGHT AT BEDTIME   DULoxetine 60 MG capsule Commonly known as: CYMBALTA Take 1 capsule (60 mg total) by mouth daily.   GINGER PO Take 1 Dose by mouth daily.   Ginseng 100 MG Caps Take by mouth.   insulin degludec 100 UNIT/ML Sopn FlexTouch Pen Commonly known as: Tyler Aas FlexTouch Inject 0.2 mLs (20 Units total) into the skin at bedtime.   Insulin Pen Needle 31G X 5 MM Misc Commonly known as: B-D UF III MINI PEN NEEDLES Daily   metFORMIN 500 MG tablet Commonly known as: GLUCOPHAGE Take 2 tablets (1,000 mg total) by mouth 2 (two) times daily with a meal.   mupirocin ointment 2 % Commonly known as: BACTROBAN APPLY OINTMENT TOPICALLY TWICE DAILY   Omega-3 1000 MG Caps Take by mouth.   oxyCODONE 15 MG immediate release tablet Commonly known as: ROXICODONE TAKE 1 2 TO 1 (ONE HALF TO ONE) TABLET BY MOUTH FOUR TO SIX TIMES DAILY IF TOLERTED NOTE TABLET IS 15 MG   pantoprazole 40 MG tablet Commonly known as: PROTONIX Take 1 tablet (40 mg total) by mouth 2 (two) times daily.   pregabalin 50 MG capsule Commonly known as: LYRICA TAKE 1 CAPSULE BY MOUTH THREE TIMES DAILY    sucralfate 1 g tablet Commonly known as: CARAFATE Take 1 tablet (1 g total) by mouth 4 (four) times daily -  with meals and at bedtime.   traZODone 100 MG tablet Commonly known as: DESYREL Take 1 tablet (100 mg total) by mouth at bedtime as needed for sleep.   triamcinolone cream 0.1 % Commonly known as: KENALOG Apply 1 application topically 2 (two) times daily.   vitamin A 10000 UNIT capsule Take 10,000 Units by mouth daily.   vitamin B-12 1000 MCG tablet Commonly known as: CYANOCOBALAMIN Take 1,000 mcg by mouth daily.   vitamin C 1000 MG tablet Take 1,000 mg by mouth daily.   Vitamin D3 125 MCG (5000 UT) Caps Take by mouth.   vitamin E 1000 UNIT capsule Take 1,000 Units by mouth daily.        OBJECTIVE:   Vital Signs: BP 116/68 (BP Location: Left Arm, Patient Position: Sitting, Cuff Size: Normal)   Pulse 81   Temp 98 F (36.7 C)   Ht _0  (1.727 m)   Wt 227 lb 12.8 oz (103.3 kg)   SpO2 98%   BMI 34.64 kg/m   Wt Readings from Last 3 Encounters:  04/25/19 227 lb 12.8 oz (103.3 kg)  01/24/19 226 lb (102.5 kg)  01/18/19 221 lb (100.2 kg)     Exam: General: Pt appears well and is in NAD  Neck: General: Supple without adenopathy. Thyroid: Thyroid size normal.  No goiter or nodules appreciated. No thyroid bruit.  Lungs: Clear with good BS bilat with no rales, rhonchi, or wheezes  Heart: RRR with normal S1 and S2 and no gallops; no murmurs; no rub  Abdomen: Normoactive bowel sounds, soft, nontender, without masses or organomegaly palpable  Extremities: No pretibial edema. No tremor.   Skin: Normal texture and temperature to palpation. No rash noted. No Acanthosis nigricans/skin tags.   Neuro: MS is good with appropriate affect, pt is alert and Ox3       DM foot exam: 01/24/2019  The skin of the feet is without  sores or ulcerations.Callous formation is noted bilaterally. Right 2nd toe deformity noted  The pedal pulses are 2+ on right and 2+ on left. The  sensation is absent to a screening 5.07, 10 gram monofilament bilaterally         DATA REVIEWED:  Lab Results  Component Value Date   HGBA1C 11.8 (A) 04/25/2019   HGBA1C 10.9 (A) 01/24/2019   HGBA1C 9.3 (H) 08/03/2018   Lab Results  Component Value Date   MICROALBUR 80 (H) 04/27/2018   LDLCALC 73 04/27/2018   CREATININE 1.12 01/24/2019   Lab Results  Component Value Date   MICRALBCREAT <30 04/27/2018     Lab Results  Component Value Date   CHOL 142 04/27/2018   HDL 40 04/27/2018   LDLCALC 73 04/27/2018   TRIG 143 04/27/2018   CHOLHDL 3.6 04/27/2018         ASSESSMENT / PLAN / RECOMMENDATIONS:   1) 1) Type 2 Diabetes Mellitus, Poorly controlled, With neuropathic and macrovascular complications - Most recent A1c of 11.8%. Goal A1c < 7.0 %.     - Despite starting him on basal insulin on last visit, he continues with hyperglycemia.  - Pt is not willing to give away the 2 cans of regular soda a day , he sips on them all day as well as water.  - It will be challenging to get his glucose data to optimal control but will try our best.  - due to his constant vomiting and diarrhea, will stop Metformin  - Pt will be swtiched to a multiple daily injections of insulin, pt is agreeable to this.    Plan: MEDICATIONS:  Stop Metformin   Increase tresiba to 24 units daily   Start Humalog 8 units TIDQAC  If pre-meal BG > 200 mg/dl, add 2 units of Humalog to that meal  EDUCATION / INSTRUCTIONS:  BG monitoring instructions: Patient is instructed to check his blood sugars 4 times a day, before meals .  Call [facility] if: BG persistently < 70 or > 300. . I reviewed the Rule of 15 for the treatment of hypoglycemia in detail with the patient. Literature supplied.    F/U in  3 months    Signed electronically by: Mack Guise, MD  [facility] Endocrinology  [facility] [address] [place], [place] [address] Phone:  [phone] FAX: [phone]   CC: Guadalupe Maple, MD [address] [place] [address] Phone: [phone]  Fax: [phone]  Return to Endocrinology clinic as below: Future Appointments  Date Time Provider [place]  04/27/2019  3:15 PM [facility] [place] [facility] [place]  04/28/2019  9:00 AM Marnee Guarneri T, NP [facility]  05/16/2019 12:30 PM [facility]

## 2019-04-25 NOTE — Patient Instructions (Addendum)
-   STOP Metformin  - Increase Basaglar to 24 units daily  - Start Humalog 8 units with Each meal  - If your sugar before you eat is over 200 mg/dL, please take 10 units of Humalog with that meal   - Check sugar before you eat    - HOW TO TREAT LOW BLOOD SUGARS (Blood sugar LESS THAN 70 MG/DL)  Please follow the RULE OF 15 for the treatment of hypoglycemia treatment (when your (blood sugars are less than 70 mg/dL)    STEP 1: Take 15 grams of carbohydrates when your blood sugar is low, which includes:   3-4 GLUCOSE TABS  OR  3-4 OZ OF JUICE OR REGULAR SODA OR  ONE TUBE OF GLUCOSE GEL     STEP 2: RECHECK blood sugar in 15 MINUTES STEP 3: If your blood sugar is still low at the 15 minute recheck --> then, go back to STEP 1 and treat AGAIN with another 15 grams of carbohydrates.

## 2019-04-27 ENCOUNTER — Encounter: Payer: Medicare Other | Admitting: Family Medicine

## 2019-04-27 ENCOUNTER — Ambulatory Visit (INDEPENDENT_AMBULATORY_CARE_PROVIDER_SITE_OTHER): Payer: Medicare Other

## 2019-04-27 ENCOUNTER — Telehealth: Payer: Self-pay | Admitting: *Deleted

## 2019-04-27 VITALS — BP 139/80 | HR 72 | Temp 98.0°F | Wt 228.0 lb

## 2019-04-27 DIAGNOSIS — Z0000 Encounter for general adult medical examination without abnormal findings: Secondary | ICD-10-CM

## 2019-04-27 NOTE — Progress Notes (Signed)
Subjective:   Edward Schneider. is a 63 y.o. male who presents for Medicare Annual/Subsequent preventive examination.  This visit is being conducted via phone call  - after an attmept to do on video chat - due to the COVID-19 pandemic. This patient has given me verbal consent via phone to conduct this visit, patient states they are participating from their home address. Some vital signs may be absent or patient reported.   Patient identification: identified by name, DOB, and current address.    Review of Systems:   Cardiac Risk Factors include: advanced age (>69mn, >>11women);dyslipidemia;diabetes mellitus;hypertension;male gender;smoking/ tobacco exposure;obesity (BMI >30kg/m2)     Objective:    Vitals: BP 139/80 Comment: patient reported   Pulse 72 Comment: patient reported   Temp 98 F (36.7 C) Comment: patient reported   Wt 228 lb (103.4 kg) Comment: PATIENT REPORTED   BMI 34.67 kg/m   Body mass index is 34.67 kg/m.  Advanced Directives 01/14/2019 01/13/2019 06/19/2018 04/25/2018 09/20/2017 04/04/2017 04/03/2017  Does Patient Have a Medical Advance Directive? No No No No No No No  Would patient like information on creating a medical advance directive? No - Patient declined No - Patient declined No - Patient declined Yes (MAU/Ambulatory/Procedural Areas - Information given) No - Patient declined No - Patient declined No - Patient declined    Tobacco Social History   Tobacco Use  Smoking Status Former Smoker   Packs/day: 2.00   Years: 50.00   Pack years: 100.00   Types: Cigarettes   Quit date: 07/04/2012   Years since quitting: 6.8  Smokeless Tobacco Never Used     Counseling given: Not Answered   Clinical Intake:  Pre-visit preparation completed: Yes  Pain : 0-10 Pain Score: 4  Pain Type: Chronic pain Pain Location: Back Pain Orientation: Lower Pain Descriptors / Indicators: Aching, Radiating Pain Onset: More than a month ago Pain Frequency: Constant      Nutritional Status: BMI > 30  Obese Nutritional Risks: None Diabetes: Yes CBG done?: No Did pt. bring in CBG monitor from home?: No  How often do you need to have someone help you when you read instructions, pamphlets, or other written materials from your doctor or pharmacy?: 1 - Never What is the last grade level you completed in school?: some college  Nutrition Risk Assessment:  Has the patient had any N/V/D within the last 2 months?  No  Does the patient have any non-healing wounds?  No  Has the patient had any unintentional weight loss or weight gain?  No   Diabetes:  Is the patient diabetic?  Yes  If diabetic, was a CBG obtained today?  No  Did the patient bring in their glucometer from home?  No  How often do you monitor your CBG's? Typically checks daily, new endocrinologist Dr.Shamleffer  wants him to check 3 times a day before meal.   Financial Strains and Diabetes Management:  Are you having any financial strains with the device, your supplies or your medication? Yes .  Does the patient want to be seen by Chronic Care Management for management of their diabetes?  Yes  Would the patient like to be referred to a Nutritionist or for Diabetic Management?  Yes   Referral already placed to CCM- pharmacist has been trying to reach patient. Patient states he doesn't usually get up early. And has tried calling back but always gets VM. Informed patient she is scheduled to call him on 05/16/2019 at 12:30,  patient confirmed this was a good time.   Diabetic Exams:  Diabetic Eye Exam: Completed 08/19/2018, [facility].   Diabetic Foot Exam: due, scheduled 04/2019.   Interpreter Needed?: No  Information entered by :: Shalon Salado,LPN  Past Medical History:  Diagnosis Date   Allergy    Asthma    C. difficile diarrhea    Cancer [facility]) June 2016   liver cancer   Chronic pain    DDD (degenerative disc disease), cervical    DDD (degenerative disc disease), lumbar     Diabetes mellitus without complication (HCC)    GERD (gastroesophageal reflux disease)    Headache    migraines - none since 02/17   Hypertension    Low blood sugar    Myocardial infarction ([place])    Seizures ([place])    several as child when sick.  None since age 72   Stroke [facility])    'mini-stroke" 30 yrs ago. no deficits.   Wears dentures    full upper and lower   Past Surgical History:  Procedure Laterality Date   APPENDECTOMY     BACK SURGERY     CARDIAC CATHETERIZATION     stent placed in his "18's"   COLONOSCOPY WITH PROPOFOL N/A 03/06/2016   Procedure: COLONOSCOPY WITH PROPOFOL;  Surgeon: Lucilla Lame, MD;  Location: [place];  Service: Endoscopy;  Laterality: N/A;  requests early   ESOPHAGOGASTRODUODENOSCOPY (EGD) WITH PROPOFOL N/A 09/20/2017   Procedure: ESOPHAGOGASTRODUODENOSCOPY (EGD) WITH PROPOFOL;  Surgeon: Lucilla Lame, MD;  Location: [place];  Service: Endoscopy;  Laterality: N/A;  Diabetic - oral meds   FINGER SURGERY Left    KNEE SURGERY Right    NECK SURGERY     spleen surg     spleen surgery     TOE SURGERY Right    Family History  Problem Relation Age of Onset   Arthritis Mother    Diabetes Mother    Kidney disease Mother    Heart disease Mother    Hypertension Mother    Arthritis Father    Hearing loss Father    Hypertension Father    Diabetes Sister    Heart disease Sister    Diabetes Daughter    Diabetes Maternal Aunt    Diabetes Maternal Grandmother    Social History   Socioeconomic History   Marital status: Married    Spouse name: Not on file   Number of children: Not on file   Years of education: 13.5   Highest education level: Some college, no degree  Occupational History   Occupation: disability   Scientist, product/process development strain: Not hard at all   Food insecurity    Worry: Never true    Inability: Never true   Transportation needs    Medical: No     Non-medical: No  Tobacco Use   Smoking status: Former Smoker    Packs/day: 2.00    Years: 50.00    Pack years: 100.00    Types: Cigarettes    Quit date: 07/04/2012    Years since quitting: 6.8   Smokeless tobacco: Never Used  Substance and Sexual Activity   Alcohol use: No    Alcohol/week: 0.0 standard drinks   Drug use: No   Sexual activity: Not on file  Lifestyle   Physical activity    Days per week: 0 days    Minutes per session: 0 min   Stress: Not at all  Relationships   Social  connections    Talks on phone: Once a week    Gets together: Once a week    Attends religious service: More than 4 times per year    Active member of club or organization: No    Attends meetings of clubs or organizations: Never    Relationship status: Married  Other Topics Concern   Not on file  Social History Narrative   Not on file    Outpatient Encounter Medications as of 04/27/2019  Medication Sig   ACCU-CHEK SOFTCLIX LANCETS lancets USE TO CHECK GLUCOSE ONCE DAILY   Ascorbic Acid (VITAMIN C) 1000 MG tablet Take 1,000 mg by mouth daily.   aspirin EC 81 MG tablet Take 162 mg by mouth daily.    atorvastatin (LIPITOR) 20 MG tablet Take 1 tablet (20 mg total) by mouth daily.   Cholecalciferol (VITAMIN D3) 125 MCG (5000 UT) CAPS Take by mouth.   CINNAMON PO Take 2,400 mg by mouth daily.   cyclobenzaprine (FLEXERIL) 10 MG tablet Take 1 tablet (10 mg total) by mouth 3 (three) times daily as needed for muscle spasms.   diclofenac sodium (VOLTAREN) 1 % GEL Apply 2 g topically 4 (four) times daily.   diphenhydrAMINE (BENADRYL) 25 mg capsule Take 25 mg by mouth 2 (two) times daily.    diphenoxylate-atropine (LOMOTIL) 2.5-0.025 MG tablet Take 1 tablet by mouth 4 (four) times daily as needed for diarrhea or loose stools.   doxepin (SINEQUAN) 25 MG capsule TAKE 2 CAPSULES BY MOUTH ONCE DAILY AT NIGHT AT BEDTIME   DULoxetine (CYMBALTA) 60 MG capsule Take 1 capsule (60 mg total) by  mouth daily.   Ginger, Zingiber officinalis, (GINGER PO) Take 1 Dose by mouth daily.   Ginseng 100 MG CAPS Take by mouth.   glucose blood (ACCU-CHEK AVIVA PLUS) test strip 1 each by Other route as directed. Use as instructed   insulin degludec (TRESIBA FLEXTOUCH) 100 UNIT/ML SOPN FlexTouch Pen Inject 0.24 mLs (24 Units total) into the skin at bedtime.   insulin lispro (HUMALOG KWIKPEN) 100 UNIT/ML KwikPen Inject 0.08 mLs (8 Units total) into the skin 3 (three) times daily.   Insulin Pen Needle (B-D UF III MINI PEN NEEDLES) 31G X 5 MM MISC Daily   mupirocin ointment (BACTROBAN) 2 % APPLY OINTMENT TOPICALLY TWICE DAILY   Omega-3 1000 MG CAPS Take by mouth.   oxyCODONE (ROXICODONE) 15 MG immediate release tablet TAKE 1 2 TO 1 (ONE HALF TO ONE) TABLET BY MOUTH FOUR TO SIX TIMES DAILY IF TOLERTED NOTE TABLET IS 15 MG   pantoprazole (PROTONIX) 40 MG tablet Take 1 tablet (40 mg total) by mouth 2 (two) times daily.   pregabalin (LYRICA) 50 MG capsule TAKE 1 CAPSULE BY MOUTH THREE TIMES DAILY   sucralfate (CARAFATE) 1 g tablet Take 1 tablet (1 g total) by mouth 4 (four) times daily -  with meals and at bedtime.   traZODone (DESYREL) 100 MG tablet Take 1 tablet (100 mg total) by mouth at bedtime as needed for sleep.   triamcinolone cream (KENALOG) 0.1 % Apply 1 application topically 2 (two) times daily.   vitamin A 10000 UNIT capsule Take 10,000 Units by mouth daily.   vitamin B-12 (CYANOCOBALAMIN) 1000 MCG tablet Take 1,000 mcg by mouth daily.   vitamin E 1000 UNIT capsule Take 1,000 Units by mouth daily.   EPINEPHrine (EPIPEN IJ) Inject as directed.   NARCAN 4 MG/0.1ML LIQD nasal spray kit CALL 911. ADMINISTER A SINGLE SPRAY OF NARCAN IN ONE  NOSTRIL. REPEAT EVERY 3 MINUTES AS NEEDED IF NO OR MINIMAL RESPONSE.   No facility-administered encounter medications on file as of 04/27/2019.     Activities of Daily Living In your present state of health, do you have any difficulty performing  the following activities: 04/27/2019 01/14/2019  Hearing? Y N  Comment some difficulty - no hearing aids -  Vision? N N  Comment eyeglasses -  Difficulty concentrating or making decisions? N N  Walking or climbing stairs? Y N  Comment doesnt take stairs -  Dressing or bathing? N N  Doing errands, shopping? N N  Preparing Food and eating ? N -  Using the Toilet? N -  In the past six months, have you accidently leaked urine? N -  Do you have problems with loss of bowel control? N -  Managing your Medications? Y -  Comment wife assists -  Managing your Finances? Y -  Comment wife assists -  Housekeeping or managing your Housekeeping? Y -  Comment wife assits -  Some recent data might be hidden    Patient Care Team: Guadalupe Maple, MD as PCP - General (Family Medicine) De Hollingshead, [facility] as Pharmacist (Pharmacist)   Assessment:   This is a routine wellness examination for Antonin.  Exercise Activities and Dietary recommendations Current Exercise Habits: The patient does not participate in regular exercise at present, Exercise limited by: None identified  Goals     DIET - INCREASE WATER INTAKE     Recommend drinking at least 6-8 glasses of water a day        Fall Risk Fall Risk  04/27/2019 04/25/2018 02/08/2018 11/03/2017 03/29/2017  Falls in the past year? 1 Yes No No Yes  Comment - - - - -  Number falls in past yr: 1 2 or more - - 1  Comment - - - - -  Injury with Fall? 0 No - - No  Comment - - - - -  Risk Factor Category  - High Fall Risk - - -  Risk for fall due to : History of fall(s);Impaired balance/gait - - - -  Risk for fall due to: Comment - - - - -  Follow up - Falls evaluation completed;Falls prevention discussed - - -   FALL RISK PREVENTION PERTAINING TO THE HOME:  Any stairs in or around the home? Yes  2 steps  If so, are there any without handrails? No   Home free of loose throw rugs in walkways, pet beds, electrical cords, etc? Yes  Adequate lighting  in your home to reduce risk of falls? Yes   ASSISTIVE DEVICES UTILIZED TO PREVENT FALLS:  Life alert? No  Use of a cane, walker or w/c? Yes cane  Grab bars in the bathroom? No  Shower chair or bench in shower? Yes  Elevated toilet seat or a handicapped toilet? No    TIMED UP AND GO:  Unable to perform    Depression Screen PHQ 2/9 Scores 04/27/2019 04/25/2018 11/03/2017 03/12/2017  PHQ - 2 Score 3 6 0 2  PHQ- 9 Score 8 16 - 7  Exception Documentation - - - -    Cognitive Function     6CIT Screen 04/27/2019 04/25/2018  What Year? 0 points 0 points  What month? 0 points 0 points  What time? 0 points 0 points  Count back from 20 0 points 0 points  Months in reverse 0 points 0 points  Repeat phrase 0 points 0  points  Total Score 0 0    Immunization History  Administered Date(s) Administered   Influenza,inj,Quad PF,6+ Mos 08/21/2015, 09/24/2016, 07/12/2017, 06/23/2018, 08/04/2018   Influenza-Unspecified 07/19/2014, 08/04/2018   Pneumococcal Polysaccharide-23 06/07/2012   Td 10/28/2007   Tdap 04/25/2018    Qualifies for Shingles Vaccine? Yes  Zostavax completed n/a. Due for Shingrix. Education has been provided regarding the importance of this vaccine. Pt has been advised to call insurance company to determine out of pocket expense. Advised may also receive vaccine at local pharmacy or Health Dept. Verbalized acceptance and understanding.  Tdap: up to date   Flu Vaccine: up to date   Pneumococcal Vaccine: completed pneumovax 23 06/07/2012  Screening Tests Health Maintenance  Topic Date Due   FOOT EXAM  01/05/2018   COLONOSCOPY  03/07/2019   URINE MICROALBUMIN  04/28/2019   INFLUENZA VACCINE  05/20/2019   OPHTHALMOLOGY EXAM  08/20/2019   HEMOGLOBIN A1C  10/26/2019   TETANUS/TDAP  04/25/2028   PNEUMOCOCCAL POLYSACCHARIDE VACCINE AGE 10-64 HIGH RISK  Completed   HIV Screening  Completed   Hepatitis C Screening  Addressed   Cancer Screenings:  Colorectal  Screening: Completed 03/06/2016. Repeat every 3 years  Lung Cancer Screening: (Low Dose CT Chest recommended if Age 8-80 years, 30 pack-year currently smoking OR have quit w/in 15years.) does qualify.   Lung Cancer Screening Referral: An Epic message has been sent to Burgess Estelle, RN (Oncology Nurse Navigator) regarding the possible need for this exam. Raquel Sarna will review the patient's chart to determine if the patient truly qualifies for the exam. If the patient qualifies, Raquel Sarna will order the Low Dose CT of the chest to facilitate the scheduling of this exam.  Additional Screening:  Hepatitis C Screening: does qualify; Completed 01/24/2015  Dental Screening: Recommended annual dental exams for proper oral hygiene  Community Resource Referral:  CRR required this visit?  No        Plan:  I have personally reviewed and addressed the Medicare Annual Wellness questionnaire and have noted the following in the patients chart:  A. Medical and social history B. Use of alcohol, tobacco or illicit drugs  C. Current medications and supplements D. Functional ability and status E.  Nutritional status F.  Physical activity G. Advance directives H. List of other physicians I.  Hospitalizations, surgeries, and ER visits in previous 12 months J.  Semmes such as hearing and vision if needed, cognitive and depression L. Referrals and appointments   In addition, I have reviewed and discussed with patient certain preventive protocols, quality metrics, and best practice recommendations. A written personalized care plan for preventive services as well as general preventive health recommendations were provided to patient.   Signed,   Bevelyn Ngo, LPN  05/22/6961 Nurse Health Advisor  Nurse Notes: Patient states pain clinic is requesting patient stop his lyrica so he can manage his pain and see if he can do without it.

## 2019-04-27 NOTE — Patient Instructions (Signed)
[name] , Thank you for taking time to come for your Medicare Wellness Visit. I appreciate your ongoing commitment to your health goals. Please review the following plan we discussed and let me know if I can assist you in the future.   Screening recommendations/referrals: Colonoscopy: due, check with your gastroenterologist  Recommended yearly ophthalmology/optometry visit for glaucoma screening and checkup Recommended yearly dental visit for hygiene and checkup  Vaccinations: Influenza vaccine: up to date  Pneumococcal vaccine: up to date  Tdap vaccine: up to date Shingles vaccine: shingrix eligible, check with your insurance company for coverage      Advanced directives: please pick up a copy of this information next time you are in the office  Conditions/risks identified: diabetic- out chronic care team will be calling you to discuss your diabetic medications and assistance programs.   Next appointment: follow up in one year for your annual wellness exam.   Preventive Care 40-64 Years, Male Preventive care refers to lifestyle choices and visits with your health care provider that can promote health and wellness. What does preventive care include?  A yearly physical exam. This is also called an annual well check.  Dental exams once or twice a year.  Routine eye exams. Ask your health care provider how often you should have your eyes checked.  Personal lifestyle choices, including:  Daily care of your teeth and gums.  Regular physical activity.  Eating a healthy diet.  Avoiding tobacco and drug use.  Limiting alcohol use.  Practicing safe sex.  Taking low-dose aspirin every day starting at age 69. What happens during an annual well check? The services and screenings done by your health care provider during your annual well check will depend on your age, overall health, lifestyle risk factors, and family history of disease. Counseling  Your health care provider may  ask you questions about your:  Alcohol use.  Tobacco use.  Drug use.  Emotional well-being.  Home and relationship well-being.  Sexual activity.  Eating habits.  Work and work Statistician. Screening  You may have the following tests or measurements:  Height, weight, and BMI.  Blood pressure.  Lipid and cholesterol levels. These may be checked every 5 years, or more frequently if you are over 68 years old.  Skin check.  Lung cancer screening. You may have this screening every year starting at age 47 if you have a 30-pack-year history of smoking and currently smoke or have quit within the past 15 years.  Fecal occult blood test (FOBT) of the stool. You may have this test every year starting at age 42.  Flexible sigmoidoscopy or colonoscopy. You may have a sigmoidoscopy every 5 years or a colonoscopy every 10 years starting at age 25.  Prostate cancer screening. Recommendations will vary depending on your family history and other risks.  Hepatitis C blood test.  Hepatitis B blood test.  Sexually transmitted disease (STD) testing.  Diabetes screening. This is done by checking your blood sugar (glucose) after you have not eaten for a while (fasting). You may have this done every 1-3 years. Discuss your test results, treatment options, and if necessary, the need for more tests with your health care provider. Vaccines  Your health care provider may recommend certain vaccines, such as:  Influenza vaccine. This is recommended every year.  Tetanus, diphtheria, and acellular pertussis (Tdap, Td) vaccine. You may need a Td booster every 10 years.  Zoster vaccine. You may need this after age 45.  Pneumococcal 13-valent  conjugate (PCV13) vaccine. You may need this if you have certain conditions and have not been vaccinated.  Pneumococcal polysaccharide (PPSV23) vaccine. You may need one or two doses if you smoke cigarettes or if you have certain conditions. Talk to your  health care provider about which screenings and vaccines you need and how often you need them. This information is not intended to replace advice given to you by your health care provider. Make sure you discuss any questions you have with your health care provider. Document Released: 11/01/2015 Document Revised: 06/24/2016 Document Reviewed: 08/06/2015 Elsevier Interactive Patient Education  2017 [place] Prevention in the Home Falls can cause injuries. They can happen to people of all ages. There are many things you can do to make your home safe and to help prevent falls. What can I do on the outside of my home?  Regularly fix the edges of walkways and driveways and fix any cracks.  Remove anything that might make you trip as you walk through a door, such as a raised step or threshold.  Trim any bushes or trees on the path to your home.  Use bright outdoor lighting.  Clear any walking paths of anything that might make someone trip, such as rocks or tools.  Regularly check to see if handrails are loose or broken. Make sure that both sides of any steps have handrails.  Any raised decks and porches should have guardrails on the edges.  Have any leaves, snow, or ice cleared regularly.  Use sand or salt on walking paths during winter.  Clean up any spills in your garage right away. This includes oil or grease spills. What can I do in the bathroom?  Use night lights.  Install grab bars by the toilet and in the tub and shower. Do not use towel bars as grab bars.  Use non-skid mats or decals in the tub or shower.  If you need to sit down in the shower, use a plastic, non-slip stool.  Keep the floor dry. Clean up any water that spills on the floor as soon as it happens.  Remove soap buildup in the tub or shower regularly.  Attach bath mats securely with double-sided non-slip rug tape.  Do not have throw rugs and other things on the floor that can make you trip. What  can I do in the bedroom?  Use night lights.  Make sure that you have a light by your bed that is easy to reach.  Do not use any sheets or blankets that are too big for your bed. They should not hang down onto the floor.  Have a firm chair that has side arms. You can use this for support while you get dressed.  Do not have throw rugs and other things on the floor that can make you trip. What can I do in the kitchen?  Clean up any spills right away.  Avoid walking on wet floors.  Keep items that you use a lot in easy-to-reach places.  If you need to reach something above you, use a strong step stool that has a grab bar.  Keep electrical cords out of the way.  Do not use floor polish or wax that makes floors slippery. If you must use wax, use non-skid floor wax.  Do not have throw rugs and other things on the floor that can make you trip. What can I do with my stairs?  Do not leave any items on the stairs.  Make sure that there are handrails on both sides of the stairs and use them. Fix handrails that are broken or loose. Make sure that handrails are as long as the stairways.  Check any carpeting to make sure that it is firmly attached to the stairs. Fix any carpet that is loose or worn.  Avoid having throw rugs at the top or bottom of the stairs. If you do have throw rugs, attach them to the floor with carpet tape.  Make sure that you have a light switch at the top of the stairs and the bottom of the stairs. If you do not have them, ask someone to add them for you. What else can I do to help prevent falls?  Wear shoes that:  Do not have high heels.  Have rubber bottoms.  Are comfortable and fit you well.  Are closed at the toe. Do not wear sandals.  If you use a stepladder:  Make sure that it is fully opened. Do not climb a closed stepladder.  Make sure that both sides of the stepladder are locked into place.  Ask someone to hold it for you, if possible.   Clearly mark and make sure that you can see:  Any grab bars or handrails.  First and last steps.  Where the edge of each step is.  Use tools that help you move around (mobility aids) if they are needed. These include:  Canes.  Walkers.  Scooters.  Crutches.  Turn on the lights when you go into a dark area. Replace any light bulbs as soon as they burn out.  Set up your furniture so you have a clear path. Avoid moving your furniture around.  If any of your floors are uneven, fix them.  If there are any pets around you, be aware of where they are.  Review your medicines with your doctor. Some medicines can make you feel dizzy. This can increase your chance of falling. Ask your doctor what other things that you can do to help prevent falls. This information is not intended to replace advice given to you by your health care provider. Make sure you discuss any questions you have with your health care provider. Document Released: 08/01/2009 Document Revised: 03/12/2016 Document Reviewed: 11/09/2014 Elsevier Interactive Patient Education  2017 [employer].

## 2019-04-27 NOTE — Telephone Encounter (Signed)
Received referral for low dose lung cancer screening CT scan. Message left at phone number listed in EMR for patient to call me back to facilitate scheduling scan.  

## 2019-04-28 ENCOUNTER — Encounter: Payer: Self-pay | Admitting: Nurse Practitioner

## 2019-04-28 ENCOUNTER — Telehealth: Payer: Self-pay | Admitting: *Deleted

## 2019-04-28 DIAGNOSIS — Z122 Encounter for screening for malignant neoplasm of respiratory organs: Secondary | ICD-10-CM

## 2019-04-28 DIAGNOSIS — Z87891 Personal history of nicotine dependence: Secondary | ICD-10-CM

## 2019-04-28 NOTE — Telephone Encounter (Signed)
Received referral for initial lung cancer screening scan. Contacted patient and obtained smoking history,(former, quit 07/04/12, 100 pack year) as well as answering questions related to screening process. Patient denies signs of lung cancer such as weight loss or hemoptysis. Patient denies comorbidity that would prevent curative treatment if lung cancer were found. Patient is scheduled for shared decision making visit and CT scan on 05/02/19 at 130pm.

## 2019-05-02 ENCOUNTER — Inpatient Hospital Stay: Payer: Medicare Other | Attending: Nurse Practitioner | Admitting: Nurse Practitioner

## 2019-05-02 ENCOUNTER — Ambulatory Visit
Admission: RE | Admit: 2019-05-02 | Discharge: 2019-05-02 | Disposition: A | Discharge status: Home / Self Care | Payer: Medicare Other | Source: Ambulatory Visit | Attending: Nurse Practitioner | Admitting: Nurse Practitioner

## 2019-05-02 ENCOUNTER — Other Ambulatory Visit: Payer: Self-pay

## 2019-05-02 DIAGNOSIS — Z87891 Personal history of nicotine dependence: Secondary | ICD-10-CM

## 2019-05-02 DIAGNOSIS — Z122 Encounter for screening for malignant neoplasm of respiratory organs: Secondary | ICD-10-CM | POA: Insufficient documentation

## 2019-05-02 NOTE — Progress Notes (Signed)
Virtual Visit via Video Enabled Telemedicine Note   I connected with Edward Schneider. on 05/02/19 at 1:30 PM EST by video enabled telemedicine visit and verified that I am speaking with the correct person using two identifiers.   I discussed the limitations, risks, security and privacy concerns of performing an evaluation and management service by telemedicine and the availability of in-person appointments. I also discussed with the patient that there may be a patient responsible charge related to this service. The patient expressed understanding and agreed to proceed.   Other persons participating in the visit and their role in the encounter: Burgess Estelle, RN- checking in patient & navigation  Patient's location: clinic  Provider's location: home  Chief Complaint: Low Dose CT Screening  Patient agreed to evaluation by telemedicine to discuss shared decision making for consideration of low dose CT lung cancer screening.    In accordance with CMS guidelines, patient has met eligibility criteria including age, absence of signs or symptoms of lung cancer.  Social History   Tobacco Use  . Smoking status: Former Smoker    Packs/day: 2.00    Years: 50.00    Pack years: 100.00    Types: Cigarettes    Quit date: 07/04/2012    Years since quitting: 6.8  . Smokeless tobacco: Never Used  Substance Use Topics  . Alcohol use: No    Alcohol/week: 0.0 standard drinks     A shared decision-making session was conducted prior to the performance of CT scan. This includes one or more decision aids, includes benefits and harms of screening, follow-up diagnostic testing, over-diagnosis, false positive rate, and total radiation exposure.   Counseling on the importance of adherence to annual lung cancer LDCT screening, impact of co-morbidities, and ability or willingness to undergo diagnosis and treatment is imperative for compliance of the program.   Counseling on the importance of continued smoking  cessation for former smokers; the importance of smoking cessation for current smokers, and information about tobacco cessation interventions have been given to patient including [place] and [address] [employer] [place] programs.   Written order for lung cancer screening with LDCT has been given to the patient and any and all questions have been answered to the best of my abilities.    Yearly follow up will be coordinated by Burgess Estelle, Thoracic Navigator.  I discussed the assessment and treatment plan with the patient. The patient was provided an opportunity to ask questions and all were answered. The patient agreed with the plan and demonstrated an understanding of the instructions.   The patient was advised to call back or seek an in-person evaluation if the symptoms worsen or if the condition fails to improve as anticipated.   I provided 15 minutes of face-to-face video visit time during this encounter, and > 50% was spent counseling as documented under my assessment & plan.   Beckey Rutter, DNP, AGNP-C [place] [facility] [phone] (work cell) [phone] (office)

## 2019-05-03 ENCOUNTER — Telehealth: Payer: Self-pay | Admitting: *Deleted

## 2019-05-03 NOTE — Telephone Encounter (Signed)
Notified patient of LDCT lung cancer screening program results with recommendation for 12 month follow up imaging. Also notified of incidental findings noted below and is encouraged to discuss further with PCP who will receive a copy of this note and/or the CT report. Patient verbalizes understanding.   IMPRESSION: Lung-RADS 2, benign appearance or behavior. Continue annual screening with low-dose chest CT without contrast in 12 months.  Aortic Atherosclerosis (ICD10-I70.0).

## 2019-05-15 DIAGNOSIS — M179 Osteoarthritis of knee, unspecified: Secondary | ICD-10-CM | POA: Diagnosis not present

## 2019-05-15 DIAGNOSIS — Z5181 Encounter for therapeutic drug level monitoring: Secondary | ICD-10-CM | POA: Diagnosis not present

## 2019-05-15 DIAGNOSIS — E11610 Type 2 diabetes mellitus with diabetic neuropathic arthropathy: Secondary | ICD-10-CM | POA: Diagnosis not present

## 2019-05-15 DIAGNOSIS — G629 Polyneuropathy, unspecified: Secondary | ICD-10-CM | POA: Diagnosis not present

## 2019-05-16 ENCOUNTER — Ambulatory Visit: Payer: Medicare Other | Admitting: Pharmacist

## 2019-05-16 ENCOUNTER — Encounter: Payer: Self-pay | Admitting: Nurse Practitioner

## 2019-05-16 DIAGNOSIS — E1142 Type 2 diabetes mellitus with diabetic polyneuropathy: Secondary | ICD-10-CM

## 2019-05-16 DIAGNOSIS — J449 Chronic obstructive pulmonary disease, unspecified: Secondary | ICD-10-CM

## 2019-05-16 NOTE — Patient Instructions (Signed)
Visit Information  Goals Addressed            This Visit's Progress     Patient Stated   . "I can't afford my medications (pt-stated)       Current Barriers:  . Financial concerns - patient notes that he is having a difficult time afford Tresiba and Humalog. He notes previous inability to take Jardiance and Anoro due to not being able to afford these medications o T2DM; last A1c 11.8%; follows with [facility] in [place]; currently on Tresiba 24 units daily and Humalog 8-12 units TID  o COPD; no spirometry on file; hx Anoro therapy, but patient was unable to afford. Notes today that he "needs something" for his breathing. Appointment with Marnee Guarneri, NP in clinic tomorrow.  Pharmacist Clinical Goal(s):  Marland Kitchen Over the next 90 days, patient will work with PharmD to address needs related to medication access  Interventions: . Reviewed household income information - patient qualifies for assistance through [employer] for [place]. Will communicate with endocrinology about switching from Humalog to Novolog to only complete 1 application to [employer]. Patient would also be able to receive Ozempic from [employer], or qualify for [employer] therapy through [employer]. Will communicate this to endocrinology.  . Patient has appointment with [facility] tomorrow; will discuss to assess breathing and need for bronchodilator therapy, and can pursue appropriate patient assistance for that as well.   Patient Self Care Activities:  . Self administers medications as prescribed . Calls pharmacy for medication refills  Initial goal documentation        The patient verbalized understanding of instructions provided today and declined a print copy of patient instruction materials.   Plan: - Will collaborate with endocrinology and Marnee Guarneri, NP as above - Will collect patient signature and financial documentation from patient at his office appointment tomorrow.   Catie  Darnelle Maffucci, PharmD Clinical Pharmacist [place] [phone]

## 2019-05-16 NOTE — Chronic Care Management (AMB) (Signed)
Chronic Care Management   Note  05/16/2019 Name: Edward Schneider. MRN: [id number] DOB: 1956-04-10   Subjective:  Edward Schneider. is a 63 y.o. year old male who is a primary care patient of Crissman, Jeannette How, MD. The CCM team was consulted for assistance with chronic disease management and care coordination needs.    Spoke with patient today regarding medication access concerns   Mr. Venuto was given information about Chronic Care Management services today including:  1. CCM service includes personalized support from designated clinical staff supervised by his physician, including individualized plan of care and coordination with other care providers 2. 24/7 contact phone numbers for assistance for urgent and routine care needs. 3. Service will only be billed when office clinical staff spend 20 minutes or more in a month to coordinate care. 4. Only one practitioner may furnish and bill the service in a calendar month. 5. The patient may stop CCM services at any time (effective at the end of the month) by phone call to the office staff. 6. The patient will be responsible for cost sharing (co-pay) of up to 20% of the service fee (after annual deductible is met).  Patient agreed to services and verbal consent obtained.   Review of patient status, including review of consultants reports, laboratory and other test data, was performed as part of comprehensive evaluation and provision of chronic care management services.   Objective:  Lab Results  Component Value Date   CREATININE 1.12 01/24/2019   CREATININE 0.98 01/15/2019   CREATININE 1.92 (H) 01/14/2019    Lab Results  Component Value Date   HGBA1C 11.8 (A) 04/25/2019       Component Value Date/Time   CHOL 142 04/27/2018 1647   CHOL 152 11/03/2017 1450   TRIG 143 04/27/2018 1647   TRIG 203 (H) 11/03/2017 1450   HDL 40 04/27/2018 1647   CHOLHDL 3.6 04/27/2018 1647   VLDL 41 (H) 11/03/2017 1450   LDLCALC 73 04/27/2018 1647      Clinical ASCVD: Yes -CVA  BP Readings from Last 3 Encounters:  04/27/19 139/80  04/25/19 116/68  01/24/19 124/78    Allergies  Allergen Reactions   Bee Pollen Anaphylaxis    Died 3 times when stung by bees. Carries Epi-pen at all times.    Bee Venom Anaphylaxis   Crestor [Rosuvastatin Calcium] Shortness Of Breath and Swelling   Fentanyl Itching and Hives    blisters Patch   Gabapentin Diarrhea    Severe diarrhea which caused incontinence, loss of appetite and weight loss.   Shellfish Allergy Anaphylaxis and Swelling    Shrimp causes throat to swell and tingling in tongue. Can eat other white fish, crabcakes, and oysters.   Buprenorphine Hcl Itching   Morphine Itching   Simvastatin Diarrhea    Medications Reviewed Today    Reviewed by Bevelyn Ngo, LPN (Licensed Practical Nurse) on 04/27/19 at [place] List Status: <None>  Medication Order Taking? Sig Documenting Provider Last Dose Status Informant  ACCU-CHEK SOFTCLIX LANCETS lancets [id number] Yes USE TO CHECK GLUCOSE ONCE DAILY Guadalupe Maple, MD Taking Active Pharmacy Records  Ascorbic Acid (VITAMIN C) 1000 MG tablet [id number] Yes Take 1,000 mg by mouth daily. [provider] Taking Active   aspirin EC 81 MG tablet [id number] Yes Take 162 mg by mouth daily.  [provider] Taking Active Pharmacy Records  atorvastatin (LIPITOR) 20 MG tablet [id number] Yes Take 1 tablet (20 mg total) by mouth  daily. Guadalupe Maple, MD Taking Active Pharmacy Records  Cholecalciferol (VITAMIN D3) 125 MCG (5000 UT) CAPS [id number] Yes Take by mouth. [provider] Taking Active   CINNAMON PO [id number] Yes Take 2,400 mg by mouth daily. [provider] Taking Active Pharmacy Records  cyclobenzaprine (FLEXERIL) 10 MG tablet [id number] Yes Take 1 tablet (10 mg total) by mouth 3 (three) times daily as needed for muscle spasms. Lucilla Lame, MD Taking Active Pharmacy Records  diclofenac sodium  (VOLTAREN) 1 % GEL [id number] Yes Apply 2 g topically 4 (four) times daily. Volney American, [place] Taking Active Pharmacy Records  diphenhydrAMINE (BENADRYL) 25 mg capsule [id number] Yes Take 25 mg by mouth 2 (two) times daily.  [provider] Taking Active Pharmacy Records  diphenoxylate-atropine (LOMOTIL) 2.5-0.025 MG tablet [id number] Yes Take 1 tablet by mouth 4 (four) times daily as needed for diarrhea or loose stools. Lucilla Lame, MD Taking Active Pharmacy Records  doxepin [facility]) 25 MG capsule [id number] Yes TAKE 2 CAPSULES BY MOUTH ONCE DAILY AT NIGHT AT BEDTIME [provider] Taking Active Pharmacy Records  DULoxetine (CYMBALTA) 60 MG capsule [id number] Yes Take 1 capsule (60 mg total) by mouth daily. Guadalupe Maple, MD Taking Active Pharmacy Records  EPINEPHrine [facility]) [id number] No Inject as directed. [provider] Not Taking Active   Ginger, Zingiber officinalis, (GINGER PO) [id number] Yes Take 1 Dose by mouth daily. [provider] Taking Active   Ginseng 100 MG CAPS [id number] Yes Take by mouth. [provider] Taking Active Pharmacy Records  glucose blood (ACCU-CHEK AVIVA PLUS) test strip [id number] Yes 1 each by Other route as directed. Use as instructed Shamleffer, Melanie Crazier, MD Taking Active   insulin degludec (TRESIBA FLEXTOUCH) 100 UNIT/ML SOPN FlexTouch Pen [id number] Yes Inject 0.24 mLs (24 Units total) into the skin at bedtime. Shamleffer, Melanie Crazier, MD Taking Active   insulin lispro (HUMALOG KWIKPEN) 100 UNIT/ML KwikPen [id number] Yes Inject 0.08 mLs (8 Units total) into the skin 3 (three) times daily. Shamleffer, Melanie Crazier, MD Taking Active   Insulin Pen Needle (B-D UF III MINI PEN NEEDLES) 31G X 5 MM MISC [id number] Yes Daily Shamleffer, Melanie Crazier, MD Taking Active   mupirocin ointment (BACTROBAN) 2 % [id number] Yes APPLY OINTMENT TOPICALLY TWICE DAILY [provider] Taking Active Pharmacy  Records  [facility] 4 MG/0.1ML LIQD nasal spray kit [id number] No CALL 911. ADMINISTER A SINGLE SPRAY OF NARCAN IN ONE NOSTRIL. REPEAT EVERY 3 MINUTES AS NEEDED IF NO OR MINIMAL RESPONSE. [provider] Not Taking Active   Omega-3 1000 MG CAPS [id number] Yes Take by mouth. [provider] Taking Active Pharmacy Records  oxyCODONE (ROXICODONE) 15 MG immediate release tablet [id number] Yes TAKE 1 2 TO 1 (ONE HALF TO ONE) TABLET BY MOUTH FOUR TO SIX TIMES DAILY IF TOLERTED NOTE TABLET IS 15 MG [provider] Taking Active Pharmacy Records  pantoprazole (PROTONIX) 40 MG tablet [id number] Yes Take 1 tablet (40 mg total) by mouth 2 (two) times daily. Guadalupe Maple, MD Taking Active Pharmacy Records  pregabalin [facility]) 50 MG capsule [id number] Yes TAKE 1 CAPSULE BY MOUTH THREE TIMES DAILY Crissman, Jeannette How, MD Taking Active Pharmacy Records  sucralfate (CARAFATE) 1 g tablet [id number] Yes Take 1 tablet (1 g total) by mouth 4 (four) times daily -  with meals and at bedtime. Guadalupe Maple, MD Taking Active Pharmacy Records  traZODone (DESYREL) 100 MG tablet [id number] Yes Take 1 tablet (100 mg total) by mouth at bedtime  as needed for sleep. Guadalupe Maple, MD Taking Active Pharmacy Records  triamcinolone cream (KENALOG) 0.1 % [id number] Yes Apply 1 application topically 2 (two) times daily. Guadalupe Maple, MD Taking Active Pharmacy Records  vitamin A 10000 UNIT capsule [id number] Yes Take 10,000 Units by mouth daily. [provider] Taking Active   vitamin B-12 (CYANOCOBALAMIN) 1000 MCG tablet [id number] Yes Take 1,000 mcg by mouth daily. [provider] Taking Active Pharmacy Records  vitamin E 1000 UNIT capsule [id number] Yes Take 1,000 Units by mouth daily. [provider] Taking Active   Med List Note Azalee Course, RN 06/11/16 1048):  uds 12/26/15  meds due 06-13-16 meds due 07/13/16           Assessment:   Goals Addressed            This  Visit's Progress     Patient Stated    "I can't afford my medications (pt-stated)       Current Barriers:   Financial concerns - patient notes that he is having a difficult time afford Tresiba and Humalog. He notes previous inability to take Jardiance and Anoro due to not being able to afford these medications o T2DM; last A1c 11.8%; follows with [facility] in [place]; currently on Tresiba 24 units daily and Humalog 8-12 units TID  o COPD; no spirometry on file; hx Anoro therapy, but patient was unable to afford. Notes today that he "needs something" for his breathing. Appointment with Marnee Guarneri, NP in clinic tomorrow.  Pharmacist Clinical Goal(s):   Over the next 90 days, patient will work with PharmD to address needs related to medication access  Interventions:  Reviewed household income information - patient qualifies for assistance through [employer] for [place]. Will communicate with endocrinology about switching from Humalog to Novolog to only complete 1 application to [employer]. Patient would also be able to receive Ozempic from [employer], or qualify for [employer] therapy through [employer]. Will communicate this to endocrinology.   Patient has appointment with [facility] tomorrow; will discuss to assess breathing and need for bronchodilator therapy, and can pursue appropriate patient assistance for that as well.   Patient Self Care Activities:   Self administers medications as prescribed  Calls pharmacy for medication refills  Initial goal documentation        Plan: - Will collaborate with endocrinology and Marnee Guarneri, NP as above - Will collect patient signature and financial documentation from patient at his office appointment tomorrow.   Catie Darnelle Maffucci, PharmD Clinical Pharmacist [place] [phone]

## 2019-05-17 ENCOUNTER — Other Ambulatory Visit: Payer: Self-pay

## 2019-05-17 ENCOUNTER — Encounter: Payer: Self-pay | Admitting: Nurse Practitioner

## 2019-05-17 ENCOUNTER — Ambulatory Visit: Payer: Self-pay | Admitting: Pharmacist

## 2019-05-17 ENCOUNTER — Ambulatory Visit (INDEPENDENT_AMBULATORY_CARE_PROVIDER_SITE_OTHER): Payer: Medicare Other | Admitting: Nurse Practitioner

## 2019-05-17 VITALS — BP 107/69 | HR 69 | Temp 98.3°F

## 2019-05-17 DIAGNOSIS — E1159 Type 2 diabetes mellitus with other circulatory complications: Secondary | ICD-10-CM | POA: Diagnosis not present

## 2019-05-17 DIAGNOSIS — E1142 Type 2 diabetes mellitus with diabetic polyneuropathy: Secondary | ICD-10-CM

## 2019-05-17 DIAGNOSIS — E1165 Type 2 diabetes mellitus with hyperglycemia: Secondary | ICD-10-CM

## 2019-05-17 DIAGNOSIS — I10 Essential (primary) hypertension: Secondary | ICD-10-CM | POA: Diagnosis not present

## 2019-05-17 DIAGNOSIS — Z0000 Encounter for general adult medical examination without abnormal findings: Secondary | ICD-10-CM

## 2019-05-17 DIAGNOSIS — E1169 Type 2 diabetes mellitus with other specified complication: Secondary | ICD-10-CM

## 2019-05-17 DIAGNOSIS — E785 Hyperlipidemia, unspecified: Secondary | ICD-10-CM | POA: Diagnosis not present

## 2019-05-17 DIAGNOSIS — J449 Chronic obstructive pulmonary disease, unspecified: Secondary | ICD-10-CM

## 2019-05-17 DIAGNOSIS — Z794 Long term (current) use of insulin: Secondary | ICD-10-CM

## 2019-05-17 DIAGNOSIS — T148XXA Other injury of unspecified body region, initial encounter: Secondary | ICD-10-CM

## 2019-05-17 MED ORDER — ANORO ELLIPTA 62.5-25 MCG/INH IN AEPB: 1.0000 | INHALATION_SPRAY | Freq: Every day | RESPIRATORY_TRACT | 12 refills | Status: DC

## 2019-05-17 MED ORDER — ALBUTEROL SULFATE HFA 108 (90 BASE) MCG/ACT IN AERS: 2.0000 | INHALATION_SPRAY | Freq: Four times a day (QID) | RESPIRATORY_TRACT | 3 refills | Status: DC | PRN

## 2019-05-17 MED ORDER — DOXYCYCLINE HYCLATE 100 MG PO TABS: 100.0000 mg | ORAL_TABLET | Freq: Two times a day (BID) | ORAL | 0 refills | Status: DC

## 2019-05-17 MED ORDER — MUPIROCIN CALCIUM 2 % EX CREA: 1.0000 "application " | TOPICAL_CREAM | Freq: Two times a day (BID) | CUTANEOUS | 0 refills | Status: DC

## 2019-05-17 NOTE — Assessment & Plan Note (Signed)
Chronic, stable.  CCM pharmacist in to discuss inhaler assistance, will print for Anoro and Ventolin.

## 2019-05-17 NOTE — Assessment & Plan Note (Signed)
Recommend continued focus on health diet choices and regular physical activity (30 minutes 5 days a week). 

## 2019-05-17 NOTE — Assessment & Plan Note (Signed)
Chronic, ongoing.  Continue current medication regimen and collaboration with endocrinology.  Referral to podiatry for ongoing foot care.

## 2019-05-17 NOTE — Assessment & Plan Note (Signed)
Chronic, stable with BP below goal.  Continue current medication regimen and adjust as needed. Labs today.

## 2019-05-17 NOTE — Patient Instructions (Signed)
Carbohydrate Counting for Diabetes Mellitus, Adult  Carbohydrate counting is a method of keeping track of how many carbohydrates you eat. Eating carbohydrates naturally increases the amount of sugar (glucose) in the blood. Counting how many carbohydrates you eat helps keep your blood glucose within normal limits, which helps you manage your diabetes (diabetes mellitus). It is important to know how many carbohydrates you can safely have in each meal. This is different for every person. A diet and nutrition specialist (registered dietitian) can help you make a meal plan and calculate how many carbohydrates you should have at each meal and snack. Carbohydrates are found in the following foods:  Grains, such as breads and cereals.  Dried beans and soy products.  Starchy vegetables, such as potatoes, peas, and corn.  Fruit and fruit juices.  Milk and yogurt.  Sweets and snack foods, such as cake, cookies, candy, chips, and soft drinks. How do I count carbohydrates? There are two ways to count carbohydrates in food. You can use either of the methods or a combination of both. Reading "Nutrition Facts" on packaged food The "Nutrition Facts" list is included on the labels of almost all packaged foods and beverages in the [place] It includes:  The serving size.  Information about nutrients in each serving, including the grams (g) of carbohydrate per serving. To use the "Nutrition Facts":  Decide how many servings you will have.  Multiply the number of servings by the number of carbohydrates per serving.  The resulting number is the total amount of carbohydrates that you will be having. Learning standard serving sizes of other foods When you eat carbohydrate foods that are not packaged or do not include "Nutrition Facts" on the label, you need to measure the servings in order to count the amount of carbohydrates:  Measure the foods that you will eat with a food scale or measuring cup, if needed.   Decide how many standard-size servings you will eat.  Multiply the number of servings by 15. Most carbohydrate-rich foods have about 15 g of carbohydrates per serving. ? For example, if you eat 8 oz (170 g) of strawberries, you will have eaten 2 servings and 30 g of carbohydrates (2 servings x 15 g = 30 g).  For foods that have more than one food mixed, such as soups and casseroles, you must count the carbohydrates in each food that is included. The following list contains standard serving sizes of common carbohydrate-rich foods. Each of these servings has about 15 g of carbohydrates:   hamburger bun or  English muffin.   oz (15 mL) syrup.   oz (14 g) jelly.  1 slice of bread.  1 six-inch tortilla.  3 oz (85 g) cooked rice or pasta.  4 oz (113 g) cooked dried beans.  4 oz (113 g) starchy vegetable, such as peas, corn, or potatoes.  4 oz (113 g) hot cereal.  4 oz (113 g) mashed potatoes or  of a large baked potato.  4 oz (113 g) canned or frozen fruit.  4 oz (120 mL) fruit juice.  4-6 crackers.  6 chicken nuggets.  6 oz (170 g) unsweetened dry cereal.  6 oz (170 g) plain fat-free yogurt or yogurt sweetened with artificial sweeteners.  8 oz (240 mL) milk.  8 oz (170 g) fresh fruit or one small piece of fruit.  24 oz (680 g) popped popcorn. Example of carbohydrate counting Sample meal  3 oz (85 g) chicken breast.  6 oz (170 g)   brown rice.  4 oz (113 g) corn.  8 oz (240 mL) milk.  8 oz (170 g) strawberries with sugar-free whipped topping. Carbohydrate calculation 1. Identify the foods that contain carbohydrates: ? Rice. ? Corn. ? Milk. ? Strawberries. 2. Calculate how many servings you have of each food: ? 2 servings rice. ? 1 serving corn. ? 1 serving milk. ? 1 serving strawberries. 3. Multiply each number of servings by 15 g: ? 2 servings rice x 15 g = 30 g. ? 1 serving corn x 15 g = 15 g. ? 1 serving milk x 15 g = 15 g. ? 1 serving  strawberries x 15 g = 15 g. 4. Add together all of the amounts to find the total grams of carbohydrates eaten: ? 30 g + 15 g + 15 g + 15 g = 75 g of carbohydrates total. Summary  Carbohydrate counting is a method of keeping track of how many carbohydrates you eat.  Eating carbohydrates naturally increases the amount of sugar (glucose) in the blood.  Counting how many carbohydrates you eat helps keep your blood glucose within normal limits, which helps you manage your diabetes.  A diet and nutrition specialist (registered dietitian) can help you make a meal plan and calculate how many carbohydrates you should have at each meal and snack. This information is not intended to replace advice given to you by your health care provider. Make sure you discuss any questions you have with your health care provider. Document Released: 10/05/2005 Document Revised: 04/29/2017 Document Reviewed: 03/18/2016 Elsevier Patient Education  2020 Elsevier Inc.  

## 2019-05-17 NOTE — Chronic Care Management (AMB) (Signed)
  Chronic Care Management   Follow Up Note   05/17/2019 Name: Edward Schneider. MRN: [id number] DOB: 1955/10/26  Referred by: Guadalupe Maple, MD Reason for referral : Chronic Care Management (Medication Management)   Edward Schneider. is a 63 y.o. year old male who is a primary care patient of Crissman, Jeannette How, MD. The CCM team was consulted for assistance with chronic disease management and care coordination needs.    Met with patient today to discuss patient assistance.   Review of patient status, including review of consultants reports, relevant laboratory and other test results, and collaboration with appropriate care team members and the patient's provider was performed as part of comprehensive patient evaluation and provision of chronic care management services.    Goals Addressed            This Visit's Progress     Patient Stated   . "I can't afford my medications (pt-stated)       Current Barriers:  . Financial concerns - patient notes that he is having a difficult time affording insulins and inhalers.  o Meets income criteria for patient assistance for [employer]; received message from endocrinology that OK to switch from Humalog to Novolog.  o Patient previously unable to continue on Anoro. Patient notes that he has spent at least $600 on copays this calendar year.   Pharmacist Clinical Goal(s):  Marland Kitchen Over the next 90 days, patient will work with PharmD to address needs related to medication access  Interventions: . Received patient and provider portion of [employer] application, as well as patient income information. Submitted to [employer].  . Patient will get his out of pocket spend report from [place]. Will begin application process for Anoro and Ventolin through [employer]. Will collaborate with Marnee Guarneri, who is seeing patient today for a physical, for a printed prescription for these medications.   Patient Self Care Activities:  .  Self administers medications as prescribed . Calls pharmacy for medication refills  Please see past updates related to this goal by clicking on the "Past Updates" button in the selected goal          Plan:  - Patient will get out of pocket spend report from [facility] and will drop off at clinic for me. Once received, will submit [place] assistance for Anoro and Ventolin  Catie Darnelle Maffucci, PharmD Clinical Pharmacist [place] [phone]

## 2019-05-17 NOTE — Assessment & Plan Note (Addendum)
Present under right great toe.  Due to T2DM and risk for infection script for Doxycycline and Mupirocin sent.  Recommend monitoring area daily.  Will place podiatry referral, would benefit from diabetic foot care with specialist vs current location.  Return to office for worsening or continued issues.

## 2019-05-17 NOTE — Patient Instructions (Signed)
Visit Information  Goals Addressed            This Visit's Progress     Patient Stated   . "I can't afford my medications (pt-stated)       Current Barriers:  . Financial concerns - patient notes that he is having a difficult time affording insulins and inhalers.  o Meets income criteria for patient assistance for [employer]; received message from endocrinology that OK to switch from Humalog to Novolog.  o Patient previously unable to continue on Anoro. Patient notes that he has spent at least $600 on copays this calendar year.   Pharmacist Clinical Goal(s):  Marland Kitchen Over the next 90 days, patient will work with PharmD to address needs related to medication access  Interventions: . Received patient and provider portion of [employer] application, as well as patient income information. Submitted to [employer].  . Patient will get his out of pocket spend report from [place]. Will begin application process for Anoro and Ventolin through [employer]. Will collaborate with Marnee Guarneri, who is seeing patient today for a physical, for a printed prescription for these medications.   Patient Self Care Activities:  . Self administers medications as prescribed . Calls pharmacy for medication refills  Please see past updates related to this goal by clicking on the "Past Updates" button in the selected goal         The patient verbalized understanding of instructions provided today and declined a print copy of patient instruction materials.   Plan:  - Patient will get out of pocket spend report from [facility] and will drop off at clinic for me. Once received, will submit [place] assistance for Anoro and Ventolin  Catie Darnelle Maffucci, PharmD Clinical Pharmacist [facility] [phone]

## 2019-05-17 NOTE — Assessment & Plan Note (Signed)
Chronic, ongoing.  Continue current medication regimen and adjust as needed. Lipid panel today. 

## 2019-05-17 NOTE — Progress Notes (Addendum)
BP 107/69    Pulse 69    Temp 98.3 F (36.8 C) (Oral)    SpO2 97%    Subjective:    Patient ID: Edward Schneider., male    DOB: 05-May-1956, 63 y.o.   MRN: [id number]  HPI: Edward Schneider. is a 63 y.o. male presenting on 05/17/2019 for comprehensive medical examination. Current medical complaints include: toe abrasion  He currently lives with: wife Interim Problems from his last visit: yes   DIABETES Sees endocrinology and last saw 04/25/2019 with A1C 11.8, Dr. Wilmon Pali.  Was switched to only insulin Tresiba 24 units and Humalog 8-10 units before meals.   Hypoglycemic episodes:no Polydipsia/polyuria: no Visual disturbance: no Chest pain: no Paresthesias: no Glucose Monitoring: yes  Accucheck frequency: QID  Fasting glucose: 300's  Post prandial: 240's  Evening: 280-325  Before meals: 240-300 range Taking Insulin?: yes  Long acting insulin: Tresiba 24 units  Short acting insulin: Humalog 8-10 units Blood Pressure Monitoring: daily Retinal Examination: Up to Date Foot Exam: Up to Date Pneumovax: Up to Date Influenza: Up to Date Aspirin: yes   HYPERTENSION / HYPERLIPIDEMIA Satisfied with current treatment? yes Duration of hypertension: chronic BP monitoring frequency: rarely BP range: 100-110/60-70's BP medication side effects: no Duration of hyperlipidemia: chronic Cholesterol medication side effects: no Cholesterol supplements: none Medication compliance: good compliance Aspirin: yes Recent stressors: no Recurrent headaches: no Visual changes: no Palpitations: no Dyspnea: no Chest pain: no Lower extremity edema: no Dizzy/lightheaded: no   The 10-year ASCVD risk score Edward Schneider., et al., 2013) is: 22.3%   Values used to calculate the score:     Age: 63 years     Sex: Male     Is Non-Hispanic African American: No     Diabetic: Yes     Tobacco smoker: No     Systolic Blood Pressure: 920 mmHg     Is BP treated: No     HDL Cholesterol: 46 mg/dL  Total Cholesterol: 191 mg/dL   RIGHT GREAT TOE ABRASION: Had pedicure 4 weeks ago and abrasion to pedal aspect of left great toe.  Reports that area remains red and swollen.  Denies drainage to area, but endorses discomfort.  Has not been applying abx ointment at home.  Denies any increase in swelling or redness to area.  Has never been to podiatry.  Functional Status Survey: Is the patient deaf or have difficulty hearing?: No Does the patient have difficulty seeing, even when wearing glasses/contacts?: No Does the patient have difficulty concentrating, remembering, or making decisions?: No Does the patient have difficulty walking or climbing stairs?: No Does the patient have difficulty dressing or bathing?: No Does the patient have difficulty doing errands alone such as visiting a doctor's office or shopping?: No  FALL RISK: Fall Risk  04/27/2019 04/25/2018 02/08/2018 11/03/2017 03/29/2017  Falls in the past year? 1 Yes No No Yes  Comment - - - - -  Number falls in past yr: 1 2 or more - - 1  Comment - - - - -  Injury with Fall? 0 No - - No  Comment - - - - -  Risk Factor Category  - High Fall Risk - - -  Risk for fall due to : History of fall(s);Impaired balance/gait - - - -  Risk for fall due to: Comment - - - - -  Follow up - Falls evaluation completed;Falls prevention discussed - - -    Depression Screen Depression screen  [facility] 2/9 04/27/2019 04/25/2018 11/03/2017 03/12/2017 03/12/2017  Decreased Interest 0 3 0 2 0  Down, Depressed, Hopeless 3 3 0 0 0  PHQ - 2 Score 3 6 0 2 0  Altered sleeping 1 1 - 0 -  Tired, decreased energy 1 3 - 3 -  Change in appetite 3 1 - 2 -  Feeling bad or failure about yourself  0 3 - 0 -  Trouble concentrating 0 1 - 0 -  Moving slowly or fidgety/restless 0 1 - 0 -  Suicidal thoughts 0 0 - 0 -  PHQ-9 Score 8 16 - 7 -  Difficult doing work/chores Somewhat difficult Somewhat difficult - - -  Some recent data might be hidden    Advanced Directives <no  information>  Past Medical History:  Past Medical History:  Diagnosis Date   Allergy    Asthma    C. difficile diarrhea    Cancer ([place]) June 2016   liver cancer   Chronic pain    DDD (degenerative disc disease), cervical    DDD (degenerative disc disease), lumbar    Diabetes mellitus without complication (HCC)    GERD (gastroesophageal reflux disease)    Headache    migraines - none since 02/17   Hypertension    Low blood sugar    Myocardial infarction ([place])    Seizures ([place])    several as child when sick.  None since age 63   Stroke [facility])    'mini-stroke" 30 yrs ago. no deficits.   Wears dentures    full upper and lower    Surgical History:  Past Surgical History:  Procedure Laterality Date   APPENDECTOMY     BACK SURGERY     CARDIAC CATHETERIZATION     stent placed in his "71's"   COLONOSCOPY WITH PROPOFOL N/A 03/06/2016   Procedure: COLONOSCOPY WITH PROPOFOL;  Surgeon: Lucilla Lame, MD;  Location: [facility];  Service: Endoscopy;  Laterality: N/A;  requests early   ESOPHAGOGASTRODUODENOSCOPY (EGD) WITH PROPOFOL N/A 09/20/2017   Procedure: ESOPHAGOGASTRODUODENOSCOPY (EGD) WITH PROPOFOL;  Surgeon: Lucilla Lame, MD;  Location: [facility];  Service: Endoscopy;  Laterality: N/A;  Diabetic - oral meds   FINGER SURGERY Left    KNEE SURGERY Right    NECK SURGERY     spleen surg     spleen surgery     TOE SURGERY Right     Medications:  Current Outpatient Medications on File Prior to Visit  Medication Sig   ACCU-CHEK SOFTCLIX LANCETS lancets USE TO CHECK GLUCOSE ONCE DAILY   Ascorbic Acid (VITAMIN C) 1000 MG tablet Take 1,000 mg by mouth daily.   aspirin EC 81 MG tablet Take 162 mg by mouth daily.    atorvastatin (LIPITOR) 20 MG tablet Take 1 tablet (20 mg total) by mouth daily.   Cholecalciferol (VITAMIN D3) 125 MCG (5000 UT) CAPS Take by mouth.   CINNAMON PO Take 2,400 mg by mouth daily.   cyclobenzaprine (FLEXERIL)  10 MG tablet Take 1 tablet (10 mg total) by mouth 3 (three) times daily as needed for muscle spasms.   diclofenac sodium (VOLTAREN) 1 % GEL Apply 2 g topically 4 (four) times daily.   diphenhydrAMINE (BENADRYL) 25 mg capsule Take 25 mg by mouth 2 (two) times daily.    diphenoxylate-atropine (LOMOTIL) 2.5-0.025 MG tablet Take 1 tablet by mouth 4 (four) times daily as needed for diarrhea or loose stools.   doxepin (SINEQUAN) 25 MG capsule TAKE 2 CAPSULES  BY MOUTH ONCE DAILY AT NIGHT AT BEDTIME   DULoxetine (CYMBALTA) 60 MG capsule Take 1 capsule (60 mg total) by mouth daily.   EPINEPHrine (EPIPEN IJ) Inject as directed.   Ginger, Zingiber officinalis, (GINGER PO) Take 1 Dose by mouth daily.   Ginseng 100 MG CAPS Take by mouth.   glucose blood (ACCU-CHEK AVIVA PLUS) test strip 1 each by Other route as directed. Use as instructed   insulin degludec (TRESIBA FLEXTOUCH) 100 UNIT/ML SOPN FlexTouch Pen Inject 0.24 mLs (24 Units total) into the skin at bedtime.   Insulin Pen Needle (B-D UF III MINI PEN NEEDLES) 31G X 5 MM MISC Daily   mupirocin ointment (BACTROBAN) 2 % APPLY OINTMENT TOPICALLY TWICE DAILY   NARCAN 4 MG/0.1ML LIQD nasal spray kit CALL 911. ADMINISTER A SINGLE SPRAY OF NARCAN IN ONE NOSTRIL. REPEAT EVERY 3 MINUTES AS NEEDED IF NO OR MINIMAL RESPONSE.   Omega-3 1000 MG CAPS Take by mouth.   oxyCODONE (ROXICODONE) 15 MG immediate release tablet TAKE 1 2 TO 1 (ONE HALF TO ONE) TABLET BY MOUTH FOUR TO SIX TIMES DAILY IF TOLERTED NOTE TABLET IS 15 MG   pantoprazole (PROTONIX) 40 MG tablet Take 1 tablet (40 mg total) by mouth 2 (two) times daily.   pregabalin (LYRICA) 50 MG capsule TAKE 1 CAPSULE BY MOUTH THREE TIMES DAILY   sucralfate (CARAFATE) 1 g tablet Take 1 tablet (1 g total) by mouth 4 (four) times daily -  with meals and at bedtime.   traZODone (DESYREL) 100 MG tablet Take 1 tablet (100 mg total) by mouth at bedtime as needed for sleep.   triamcinolone cream (KENALOG)  0.1 % Apply 1 application topically 2 (two) times daily.   vitamin A 10000 UNIT capsule Take 10,000 Units by mouth daily.   vitamin B-12 (CYANOCOBALAMIN) 1000 MCG tablet Take 1,000 mcg by mouth daily.   vitamin E 1000 UNIT capsule Take 1,000 Units by mouth daily.   No current facility-administered medications on file prior to visit.     Allergies:  Allergies  Allergen Reactions   Bee Pollen Anaphylaxis    Died 3 times when stung by bees. Carries Epi-pen at all times.    Bee Venom Anaphylaxis   Crestor [Rosuvastatin Calcium] Shortness Of Breath and Swelling   Fentanyl Itching and Hives    blisters Patch   Gabapentin Diarrhea    Severe diarrhea which caused incontinence, loss of appetite and weight loss.   Shellfish Allergy Anaphylaxis and Swelling    Shrimp causes throat to swell and tingling in tongue. Can eat other white fish, crabcakes, and oysters.   Buprenorphine Hcl Itching   Morphine Itching   Simvastatin Diarrhea    Social History:  Social History   Socioeconomic History   Marital status: Married    Spouse name: Not on file   Number of children: Not on file   Years of education: 13.5   Highest education level: Some college, no degree  Occupational History   Occupation: disability   Scientist, product/process development strain: Not hard at all   Food insecurity    Worry: Never true    Inability: Never true   Transportation needs    Medical: No    Non-medical: No  Tobacco Use   Smoking status: Former Smoker    Packs/day: 2.00    Years: 50.00    Pack years: 100.00    Types: Cigarettes    Quit date: 07/04/2012    Years since quitting:  6.8   Smokeless tobacco: Never Used  Substance and Sexual Activity   Alcohol use: No    Alcohol/week: 0.0 standard drinks   Drug use: No   Sexual activity: Not on file  Lifestyle   Physical activity    Days per week: 0 days    Minutes per session: 0 min   Stress: Not at all  Relationships    Social connections    Talks on phone: Once a week    Gets together: Once a week    Attends religious service: More than 4 times per year    Active member of club or organization: No    Attends meetings of clubs or organizations: Never    Relationship status: Married   Intimate partner violence    Fear of current or ex partner: No    Emotionally abused: No    Physically abused: No    Forced sexual activity: No  Other Topics Concern   Not on file  Social History Narrative   Not on file   Social History   Tobacco Use  Smoking Status Former Smoker   Packs/day: 2.00   Years: 50.00   Pack years: 100.00   Types: Cigarettes   Quit date: 07/04/2012   Years since quitting: 6.8  Smokeless Tobacco Never Used   Social History   Substance and Sexual Activity  Alcohol Use No   Alcohol/week: 0.0 standard drinks    Family History:  Family History  Problem Relation Age of Onset   Arthritis Mother    Diabetes Mother    Kidney disease Mother    Heart disease Mother    Hypertension Mother    Arthritis Father    Hearing loss Father    Hypertension Father    Diabetes Sister    Heart disease Sister    Diabetes Daughter    Diabetes Maternal Aunt    Diabetes Maternal Grandmother     Past medical history, surgical history, medications, allergies, family history and social history reviewed with patient today and changes made to appropriate areas of the chart.   Review of Systems - negative All other ROS negative except what is listed above and in the HPI.      Objective:    BP 107/69    Pulse 69    Temp 98.3 F (36.8 C) (Oral)    SpO2 97%   Wt Readings from Last 3 Encounters:  05/02/19 227 lb 8 oz (103.2 kg)  04/27/19 228 lb (103.4 kg)  04/25/19 227 lb 12.8 oz (103.3 kg)    Physical Exam Vitals signs and nursing note reviewed.  Constitutional:      General: He is awake. He is not in acute distress.    Appearance: He is well-developed. He is morbidly  obese. He is not ill-appearing.  HENT:     Head: Normocephalic and atraumatic.     Right Ear: Hearing, tympanic membrane, ear canal and external ear normal. No drainage.     Left Ear: Hearing, tympanic membrane, ear canal and external ear normal. No drainage.     Nose: Nose normal.     Mouth/Throat:     Mouth: Mucous membranes are moist.     Pharynx: Oropharynx is clear. Uvula midline.  Eyes:     General: Lids are normal.        Right eye: No discharge.        Left eye: No discharge.     Extraocular Movements: Extraocular movements intact.  Conjunctiva/sclera: Conjunctivae normal.     Pupils: Pupils are equal, round, and reactive to light.     Visual Fields: Right eye visual fields normal and left eye visual fields normal.  Neck:     Musculoskeletal: Normal range of motion and neck supple.     Thyroid: No thyromegaly.     Vascular: No carotid bruit.     Trachea: Trachea normal.  Cardiovascular:     Rate and Rhythm: Normal rate and regular rhythm.     Pulses:          Dorsalis pedis pulses are 1+ on the right side and 1+ on the left side.       Posterior tibial pulses are 1+ on the right side and 1+ on the left side.     Heart sounds: Normal heart sounds, S1 normal and S2 normal. No murmur. No gallop.   Pulmonary:     Effort: Pulmonary effort is normal. No accessory muscle usage or respiratory distress.     Breath sounds: Normal breath sounds.  Abdominal:     General: Bowel sounds are normal.     Palpations: Abdomen is soft. There is no hepatomegaly or splenomegaly.     Tenderness: There is no abdominal tenderness.  Genitourinary:    Comments: Deferred per patient request. Musculoskeletal: Normal range of motion.     Right lower leg: Edema (trace) present.     Left lower leg: Edema (trace) present.       Feet:  Feet:     Right foot:     Skin integrity: Dry skin present.     Toenail Condition: Right toenails are abnormally thick.     Left foot:     Skin integrity: Dry  skin present.     Toenail Condition: Left toenails are abnormally thick.  Skin:    General: Skin is warm and dry.     Capillary Refill: Capillary refill takes less than 2 seconds.     Findings: No rash.  Neurological:     Mental Status: He is alert and oriented to person, place, and time.     Cranial Nerves: Cranial nerves are intact.     Gait: Gait is intact.     Deep Tendon Reflexes:     Reflex Scores:      Brachioradialis reflexes are 1+ on the right side and 1+ on the left side.      Patellar reflexes are 1+ on the right side and 1+ on the left side. Psychiatric:        Attention and Perception: Attention normal.        Mood and Affect: Mood normal.        Speech: Speech normal.        Behavior: Behavior normal. Behavior is cooperative.        Thought Content: Thought content normal.        Cognition and Memory: Cognition normal.        Judgment: Judgment normal.    Diabetic Foot Exam - Simple   Simple Foot Form Visual Inspection See comments: Yes Sensation Testing See comments: Yes Pulse Check See comments: Yes Comments Pulses to bilateral feet 1+, PT and DP.  Decreased sensation bilateral feet: right 1/10 and left 3/10.  Xerosis bilateral feet and thickened, yellow toenails.     Results for orders placed or performed in visit on 05/17/19  CBC with Differential/Platelet  Result Value Ref Range   WBC 7.5 3.4 - 10.8 x10E3/uL   RBC  4.69 4.14 - 5.80 x10E6/uL   Hemoglobin 12.9 (L) 13.0 - 17.7 g/dL   Hematocrit 38.9 37.5 - 51.0 %   MCV 83 79 - 97 fL   MCH 27.5 26.6 - 33.0 pg   MCHC 33.2 31.5 - 35.7 g/dL   RDW 13.4 11.6 - 15.4 %   Platelets 187 150 - 450 x10E3/uL   Neutrophils 47 Not Estab. %   Lymphs 42 Not Estab. %   Monocytes 7 Not Estab. %   Eos 3 Not Estab. %   Basos 1 Not Estab. %   Neutrophils Absolute 3.5 1.4 - 7.0 x10E3/uL   Lymphocytes Absolute 3.2 (H) 0.7 - 3.1 x10E3/uL   Monocytes Absolute 0.6 0.1 - 0.9 x10E3/uL   EOS (ABSOLUTE) 0.2 0.0 - 0.4  x10E3/uL   Basophils Absolute 0.1 0.0 - 0.2 x10E3/uL   Immature Granulocytes 0 Not Estab. %   Immature Grans (Abs) 0.0 0.0 - 0.1 x10E3/uL  Comprehensive metabolic panel  Result Value Ref Range   Glucose 305 (H) 65 - 99 mg/dL   BUN 13 8 - 27 mg/dL   Creatinine, Ser 1.08 0.76 - 1.27 mg/dL   GFR calc non Af Amer 73 >59 mL/min/1.73   GFR calc Af Amer 85 >59 mL/min/1.73   BUN/Creatinine Ratio 12 10 - 24   Sodium 136 134 - 144 mmol/L   Potassium 4.5 3.5 - 5.2 mmol/L   Chloride 99 96 - 106 mmol/L   CO2 21 20 - 29 mmol/L   Calcium 9.4 8.6 - 10.2 mg/dL   Total Protein 7.2 6.0 - 8.5 g/dL   Albumin 4.2 3.8 - 4.8 g/dL   Globulin, Total 3.0 1.5 - 4.5 g/dL   Albumin/Globulin Ratio 1.4 1.2 - 2.2   Bilirubin Total 0.6 0.0 - 1.2 mg/dL   Alkaline Phosphatase 86 39 - 117 IU/L   AST 43 (H) 0 - 40 IU/L   ALT 40 0 - 44 IU/L  TSH  Result Value Ref Range   TSH 2.350 0.450 - 4.500 uIU/mL  Lipid Panel w/o Chol/HDL Ratio  Result Value Ref Range   Cholesterol, Total 191 100 - 199 mg/dL   Triglycerides 182 (H) 0 - 149 mg/dL   HDL 46 >39 mg/dL   VLDL Cholesterol Cal 36 5 - 40 mg/dL   LDL Calculated 109 (H) 0 - 99 mg/dL  PSA  Result Value Ref Range   Prostate Specific Ag, Serum 0.3 0.0 - 4.0 ng/mL      Assessment & Plan:   Problem List Items Addressed This Visit      Cardiovascular and Mediastinum   Essential hypertension    Chronic, stable with BP below goal.  Continue current medication regimen and adjust as needed. Labs today.         Relevant Orders   CBC with Differential/Platelet (Completed)     Respiratory   COPD (chronic obstructive pulmonary disease) (HCC)    Chronic, stable.  CCM pharmacist in to discuss inhaler assistance, will print for Anoro and Ventolin.      Relevant Medications   umeclidinium-vilanterol (ANORO ELLIPTA) 62.5-25 MCG/INH AEPB   albuterol (VENTOLIN HFA) 108 (90 Base) MCG/ACT inhaler     Endocrine   Hyperlipidemia associated with type 2 diabetes mellitus  (HCC)    Chronic, ongoing.  Continue current medication regimen and adjust as needed.  Lipid panel today.         Relevant Orders   Comprehensive metabolic panel (Completed)   Lipid Panel w/o Chol/HDL Ratio (Completed)  Type 2 diabetes mellitus with hyperglycemia, with long-term current use of insulin (HCC)    Chronic, ongoing.  Continue current medication regimen and collaboration with endocrinology.  Recommend monitoring diet and cutting back on sodas daily.  Return in 3 months.         Nervous and Auditory   DM type 2 with diabetic peripheral neuropathy (HCC)    Chronic, ongoing.  Continue current medication regimen and collaboration with endocrinology.  Referral to podiatry for ongoing foot care.         Relevant Orders   Ambulatory referral to Podiatry     Other   Abrasion    Present under right great toe.  Due to T2DM and risk for infection script for Doxycycline and Mupirocin sent.  Recommend monitoring area daily.  Will place podiatry referral, would benefit from diabetic foot care with specialist vs current location.  Return to office for worsening or continued issues.      Morbid obesity ([place])    Recommend continued focus on health diet choices and regular physical activity (30 minutes 5 days a week).        Other Visit Diagnoses    Encounter for annual physical exam    -  Primary   Relevant Orders   TSH (Completed)   PSA (Completed)      Discussed aspirin prophylaxis for myocardial infarction prevention and decision was made to continue ASA  LABORATORY TESTING:  Health maintenance labs ordered today as discussed above.   The natural history of prostate cancer and ongoing controversy regarding screening and potential treatment outcomes of prostate cancer has been discussed with the patient. The meaning of a false positive PSA and a false negative PSA has been discussed. He indicates understanding of the limitations of this screening test and wishes to proceed  with screening PSA testing.   IMMUNIZATIONS:   - Tdap: Tetanus vaccination status reviewed: last tetanus booster within 10 years. - Influenza: Up to date - Pneumovax: Up to date - Prevnar: Not applicable - Zostavax vaccine: Not applicable  SCREENING: - Colonoscopy: Refused  Discussed with patient purpose of the colonoscopy is to detect colon cancer at curable precancerous or early stages   - AAA Screening: Not applicable  -Hearing Test: Not applicable  -Spirometry: Not applicable   PATIENT COUNSELING:    Sexuality: Discussed sexually transmitted diseases, partner selection, use of condoms, avoidance of unintended pregnancy  and contraceptive alternatives.   Advised to avoid cigarette smoking.  I discussed with the patient that most people either abstain from alcohol or drink within safe limits (<=14/week and <=4 drinks/occasion for males, <=7/weeks and <= 3 drinks/occasion for females) and that the risk for alcohol disorders and other health effects rises proportionally with the number of drinks per week and how often a drinker exceeds daily limits.  Discussed cessation/primary prevention of drug use and availability of treatment for abuse.   Diet: Encouraged to adjust caloric intake to maintain  or achieve ideal body weight, to reduce intake of dietary saturated fat and total fat, to limit sodium intake by avoiding high sodium foods and not adding table salt, and to maintain adequate dietary potassium and calcium preferably from fresh fruits, vegetables, and low-fat dairy products.    stressed the importance of regular exercise  Injury prevention: Discussed safety belts, safety helmets, smoke detector, smoking near bedding or upholstery.   Dental health: Discussed importance of regular tooth brushing, flossing, and dental visits.   Follow up plan: NEXT PREVENTATIVE PHYSICAL DUE  IN 1 YEAR. Return in about 3 months (around 08/17/2019) for T2DM, HTN/HLD, COPD.

## 2019-05-17 NOTE — Assessment & Plan Note (Signed)
Chronic, ongoing.  Continue current medication regimen and collaboration with endocrinology.  Recommend monitoring diet and cutting back on sodas daily.  Return in 3 months.

## 2019-05-18 ENCOUNTER — Other Ambulatory Visit: Payer: Self-pay | Admitting: Nurse Practitioner

## 2019-05-18 ENCOUNTER — Telehealth: Payer: Self-pay | Admitting: Family Medicine

## 2019-05-18 ENCOUNTER — Encounter: Payer: Self-pay | Admitting: Nurse Practitioner

## 2019-05-18 LAB — LIPID PANEL W/O CHOL/HDL RATIO
Cholesterol, Total: 191 mg/dL (ref 100–199)
HDL: 46 mg/dL (ref >39)
LDL Calculated: 109 mg/dL — ABNORMAL HIGH (ref 0–99)
Triglycerides: 182 mg/dL — ABNORMAL HIGH (ref 0–149)
VLDL Cholesterol Cal: 36 mg/dL (ref 5–40)

## 2019-05-18 LAB — CBC WITH DIFFERENTIAL/PLATELET
Basophils Absolute: 0.1 10*3/uL (ref 0.0–0.2)
Basos: 1 %
EOS (ABSOLUTE): 0.2 10*3/uL (ref 0.0–0.4)
Eos: 3 %
Hematocrit: 38.9 % (ref 37.5–51.0)
Hemoglobin: 12.9 g/dL — ABNORMAL LOW (ref 13.0–17.7)
Immature Grans (Abs): 0 10*3/uL (ref 0.0–0.1)
Immature Granulocytes: 0 %
Lymphocytes Absolute: 3.2 10*3/uL — ABNORMAL HIGH (ref 0.7–3.1)
Lymphs: 42 %
MCH: 27.5 pg (ref 26.6–33.0)
MCHC: 33.2 g/dL (ref 31.5–35.7)
MCV: 83 fL (ref 79–97)
Monocytes Absolute: 0.6 10*3/uL (ref 0.1–0.9)
Monocytes: 7 %
Neutrophils Absolute: 3.5 10*3/uL (ref 1.4–7.0)
Neutrophils: 47 %
Platelets: 187 10*3/uL (ref 150–450)
RBC: 4.69 x10E6/uL (ref 4.14–5.80)
RDW: 13.4 % (ref 11.6–15.4)
WBC: 7.5 10*3/uL (ref 3.4–10.8)

## 2019-05-18 LAB — COMPREHENSIVE METABOLIC PANEL
ALT: 40 IU/L (ref 0–44)
AST: 43 IU/L — ABNORMAL HIGH (ref 0–40)
Albumin/Globulin Ratio: 1.4 (ref 1.2–2.2)
Albumin: 4.2 g/dL (ref 3.8–4.8)
Alkaline Phosphatase: 86 IU/L (ref 39–117)
BUN/Creatinine Ratio: 12 (ref 10–24)
BUN: 13 mg/dL (ref 8–27)
Bilirubin Total: 0.6 mg/dL (ref 0.0–1.2)
CO2: 21 mmol/L (ref 20–29)
Calcium: 9.4 mg/dL (ref 8.6–10.2)
Chloride: 99 mmol/L (ref 96–106)
Creatinine, Ser: 1.08 mg/dL (ref 0.76–1.27)
GFR calc Af Amer: 85 mL/min/{1.73_m2} (ref >59)
GFR calc non Af Amer: 73 mL/min/{1.73_m2} (ref >59)
Globulin, Total: 3 g/dL (ref 1.5–4.5)
Glucose: 305 mg/dL — ABNORMAL HIGH (ref 65–99)
Potassium: 4.5 mmol/L (ref 3.5–5.2)
Sodium: 136 mmol/L (ref 134–144)
Total Protein: 7.2 g/dL (ref 6.0–8.5)

## 2019-05-18 LAB — PSA: Prostate Specific Ag, Serum: 0.3 ng/mL (ref 0.0–4.0)

## 2019-05-18 LAB — TSH: TSH: 2.35 u[IU]/mL (ref 0.450–4.500)

## 2019-05-18 MED ORDER — CEPHALEXIN 500 MG PO CAPS: 500.0000 mg | ORAL_CAPSULE | Freq: Three times a day (TID) | ORAL | 0 refills | Status: AC

## 2019-05-18 NOTE — Telephone Encounter (Signed)
Patient would like alternative to medications due to costs at pharmacy. Patient would also like a callback once new medication has been called in. doxycycline (VIBRA-TABS) 100 MG tablet,mupirocin cream (BACTROBAN) 2 %    [place], [place] - [place] [phone] (Phone) [phone] (Fax)    ,

## 2019-05-18 NOTE — Telephone Encounter (Signed)
Read response to patient as noted by Katherine Mantle, FNP. Patient understood and advised he would call back if he experienced any other issues.

## 2019-05-18 NOTE — Progress Notes (Signed)
Lab results letter.

## 2019-05-18 NOTE — Progress Notes (Signed)
Cost of Doxycycline too high, will sent in Keflex and recommend Neosporin OTC.

## 2019-05-18 NOTE — Telephone Encounter (Signed)
Sent in Keflex, may be less costly.  I recommend using over the counter Neosporin to apply to area.  Keflex and Doxycycline are best options, if Keflex remains too costly I would recommend then applying Neosporin only to area twice a day and monitoring.  If worsening return to office.

## 2019-05-19 ENCOUNTER — Other Ambulatory Visit: Payer: Self-pay | Admitting: Pharmacy Technician

## 2019-05-19 ENCOUNTER — Ambulatory Visit: Payer: Self-pay | Admitting: Pharmacist

## 2019-05-19 DIAGNOSIS — E1159 Type 2 diabetes mellitus with other circulatory complications: Secondary | ICD-10-CM

## 2019-05-19 NOTE — Patient Outreach (Signed)
[place] [facility]) Care Management  05/19/2019  Virgie Chery 03/01/1956 [id number]  Care coordination call placed to [place] in regards to patient's application for Tresiba and Novolog Flex.  Spoke to Ruthie who informed patient had been APPROVED 05/18/2019-10/19/2019. She informed patient would be receiving 3 boxes of Tresiba and 4 boxes of Novolog delivered by UPS to the provider's office. Inquired if shipment could be expedited and Ruthie said she would put the request in but that it was not a guarantee. She informed to expect the delivery within 5-14 business days. A refill can be ordered mid October.  Will followup with patient in 5-15 business days to inquire if medication was received.  Koralyn Prestage P. Sophira Rumler, [place] Management [phone]

## 2019-05-19 NOTE — Chronic Care Management (AMB) (Signed)
  Chronic Care Management   Follow Up Note   05/19/2019 Name: Edward Schneider. MRN: [id number] DOB: October 03, 1956  Referred by: Guadalupe Maple, MD Reason for referral : Chronic Care Management (Medication Management)   [name]. is a 63 y.o. year old male who is a primary care patient of Crissman, Jeannette How, MD. The CCM team was consulted for assistance with chronic disease management and care coordination needs.    Care coordination completed today.   Review of patient status, including review of consultants reports, relevant laboratory and other test results, and collaboration with appropriate care team members and the patient's provider was performed as part of comprehensive patient evaluation and provision of chronic care management services.    Goals Addressed            This Visit's Progress     Patient Stated   . "I can't afford my medications (pt-stated)       Current Barriers:  . Financial concerns - patient notes that he is having a difficult time affording insulins and inhalers.  o Meets income criteria for patient assistance for [employer]; received message from endocrinology that OK to switch from Humalog to Novolog.  o Patient previously unable to continue on Anoro. Patient notes that he has spent at least $600 on copays this calendar year.   Pharmacist Clinical Goal(s):  Marland Kitchen Over the next 90 days, patient will work with PharmD to address needs related to medication access  Interventions: . Received message from [facility], patient was APPROVED for [place] and Novolog assistance through 10/19/2019. Should arrive at endocrinology office in 5-14 business days. Contacted patient, informed him of this. Will inbasket Dr.   Patient Self Care Activities:  . Self administers medications as prescribed . Calls pharmacy for medication refills  Please see past updates related to this goal by clicking on the "Past Updates" button in the selected goal          Plan:   - Will continue to collaborate with patient and providers on inhaler prescription assistance.   Catie Darnelle Maffucci, PharmD Clinical Pharmacist [place] [phone]

## 2019-05-19 NOTE — Patient Instructions (Signed)
Visit Information  Goals Addressed            This Visit's Progress     Patient Stated   . "I can't afford my medications (pt-stated)       Current Barriers:  . Financial concerns - patient notes that he is having a difficult time affording insulins and inhalers.  o Meets income criteria for patient assistance for [employer]; received message from endocrinology that OK to switch from Humalog to Novolog.  o Patient previously unable to continue on Anoro. Patient notes that he has spent at least $600 on copays this calendar year.   Pharmacist Clinical Goal(s):  Marland Kitchen Over the next 90 days, patient will work with PharmD to address needs related to medication access  Interventions: . Received message from [facility], patient was APPROVED for [place] and Novolog assistance through 10/19/2019. Should arrive at endocrinology office in 5-14 business days. Contacted patient, informed him of this. Will inbasket Dr.   Patient Self Care Activities:  . Self administers medications as prescribed . Calls pharmacy for medication refills  Please see past updates related to this goal by clicking on the "Past Updates" button in the selected goal         The patient verbalized understanding of instructions provided today and declined a print copy of patient instruction materials.   Plan:  - Will continue to collaborate with patient and providers on inhaler prescription assistance.   Catie Darnelle Maffucci, PharmD Clinical Pharmacist [place] [phone]

## 2019-05-22 ENCOUNTER — Telehealth: Payer: Self-pay

## 2019-05-22 NOTE — Telephone Encounter (Signed)
Pt approved until 09/18/2019 for medications from [employer]

## 2019-05-25 ENCOUNTER — Other Ambulatory Visit: Payer: Self-pay

## 2019-05-25 ENCOUNTER — Encounter: Payer: Self-pay | Admitting: Podiatry

## 2019-05-25 ENCOUNTER — Ambulatory Visit (INDEPENDENT_AMBULATORY_CARE_PROVIDER_SITE_OTHER): Payer: Medicare Other | Admitting: Podiatry

## 2019-05-25 DIAGNOSIS — S91311A Laceration without foreign body, right foot, initial encounter: Secondary | ICD-10-CM

## 2019-05-25 DIAGNOSIS — S91319A Laceration without foreign body, unspecified foot, initial encounter: Secondary | ICD-10-CM | POA: Insufficient documentation

## 2019-05-25 NOTE — Progress Notes (Signed)
This patient presents the office with severe throbbing pain noted in the sulcus of the great toe right foot.  He says that 2 weeks ago he was at the nail salon and he was cut.  He says he was seen at his doctors and was given 5 days worth of antibiotics.  He was also told to come to this office for an evaluation of this condition.  He says he has throbbing pain noted in the bottom of his toe as he sleeps.  He does admit that he has had bleeding at the bottom of his great toe right foot.  This patient is a diabetic with peripheral neuropathy.  He presents the office today for an evaluation and treatment of this laceration big toe right foot.  General Appearance  Alert, conversant and in no acute stress.  Vascular  Dorsalis pedis and posterior tibial  pulses are palpable  bilaterally.  Capillary return is within normal limits  bilaterally. Temperature is within normal limits  bilaterally.  Neurologic  Senn-Weinstein monofilament wire test absent   bilaterally. Muscle power within normal limits bilaterally.  Nails Thick disfigured discolored nails with subungual debris  from hallux to fifth toes bilaterally. No evidence of bacterial infection or drainage bilaterally.  Orthopedic  No limitations of motion  feet .  No crepitus or effusions noted.  No bony pathology or digital deformities noted.  Skin  .  No signs of infections or ulcers noted.  The sulcus right hallux has significant callus formation at the site of the laceration.  There is also a red inflamed skin lesion medial to the thickened callus.  No evidence of any redness swelling or infection or drainage.  S/P laceration right hallux  IE.  A dremel tool was used to thin  callus at sulcus.   Patient was told to use chapstick/vaseline at the sulcus right hallux.   Told this patient he needs to soften the hyperkeratotic tissue right hallux. And immobilize his toe.  RTC 10 days for reevaluation.  Call with questions.  Gardiner Barefoot DPM

## 2019-05-26 ENCOUNTER — Telehealth: Payer: Self-pay

## 2019-05-29 ENCOUNTER — Other Ambulatory Visit: Payer: Self-pay | Admitting: Family Medicine

## 2019-05-31 ENCOUNTER — Other Ambulatory Visit: Payer: Self-pay | Admitting: Pharmacy Technician

## 2019-05-31 ENCOUNTER — Ambulatory Visit: Payer: Medicare Other | Admitting: Family Medicine

## 2019-05-31 ENCOUNTER — Other Ambulatory Visit: Payer: Self-pay | Admitting: Nurse Practitioner

## 2019-05-31 MED ORDER — ALBUTEROL SULFATE HFA 108 (90 BASE) MCG/ACT IN AERS: 2.0000 | INHALATION_SPRAY | Freq: Four times a day (QID) | RESPIRATORY_TRACT | 3 refills | Status: DC | PRN

## 2019-05-31 MED ORDER — ANORO ELLIPTA 62.5-25 MCG/INH IN AEPB: 1.0000 | INHALATION_SPRAY | Freq: Every day | RESPIRATORY_TRACT | 12 refills | Status: DC

## 2019-05-31 NOTE — Patient Outreach (Signed)
[facility] [facility]) Care Management  05/31/2019  Edward Schneider 1956-04-25 [id number]  Successful outreach call placed to patient in regards to [employer] application for [employer].  Spoke to patient, HIPAA identifiers verified.  Patient informed he had picked up his medication from his provider's office today. He confirmed receiving 3 boxes of [place] and 4 boxes of Novolog. Discussed refill procedure with patient. Refills have to be done by the providers office per [employer]. Informed patient to request at least 1 refill before the program ends at the end of the year. Patient verbalized understanding.  Patient informed he would be bringing his OOP spend by the office for embedded [place] as he was going by his pharmacy today. This is needed for the [place] application for Anoro and Ventolin HFA.   Will route note to embedded [place] and will await those documents so that the [place] application can be submitted.  Aastha Dayley P. Nizhoni Parlow, [place] Management [phone]

## 2019-06-01 ENCOUNTER — Encounter: Payer: Self-pay | Admitting: Podiatry

## 2019-06-01 ENCOUNTER — Ambulatory Visit (INDEPENDENT_AMBULATORY_CARE_PROVIDER_SITE_OTHER): Payer: Medicare Other | Admitting: Podiatry

## 2019-06-01 ENCOUNTER — Other Ambulatory Visit: Payer: Self-pay

## 2019-06-01 VITALS — Temp 96.2°F

## 2019-06-01 DIAGNOSIS — S91311A Laceration without foreign body, right foot, initial encounter: Secondary | ICD-10-CM | POA: Diagnosis not present

## 2019-06-01 NOTE — Progress Notes (Signed)
This patient presents the office follow-up for a salon induced laceration at the sulcus of the right hallux.  He was instructed to use Vaseline and Chapstick at the sulcus of the right hallux.  He was also told to immobilize his great toe on his right foot.  He presents the office today stating that he is markedly improved and is not having any pain or discomfort.  He is very pleased with his progress he presents the office today wearing a bandage at the site of the sulcus as previously directed.   Vascular  Dorsalis pedis and posterior tibial pulses are palpable  B/L.  Capillary return  WNL.  Temperature gradient is  WNL.  Skin turgor  WNL  Sensorium  Senn Weinstein monofilament wire  Absent  B/L.Marland Kitchen Absent tactile sensation.  Nail Exam  Patient has thick disfigured discolored nails both feet. nails with no evidence of bacterial or fungal infection.  Orthopedic  Exam  Muscle tone and muscle strength  WNL.  No limitations of motion feet  B/L.  No crepitus or joint effusion noted.  Foot type is unremarkable and digits show no abnormalities.  Bony prominences are unremarkable.  Skin  No open lesions.  Normal skin texture and turgor. Complete healing at site of sulcus right hallux medially.  Healing continuing  lateral half of lateral laceration right hallux.  S/P Laceration right hallux.  ROV.  The laceration is healing nicely with no evidence of any redness swelling or infection.  The lateral half of the laceration is still open and he needs to continue soaks and Vaseline application at the site.  Return to clinic as needed.  Return to clinic in 3 months for preventative foot care services   Gardiner Barefoot DPM

## 2019-06-05 ENCOUNTER — Other Ambulatory Visit: Payer: Self-pay

## 2019-06-05 ENCOUNTER — Ambulatory Visit (INDEPENDENT_AMBULATORY_CARE_PROVIDER_SITE_OTHER): Payer: Medicare Other | Admitting: Family Medicine

## 2019-06-05 ENCOUNTER — Encounter: Payer: Self-pay | Admitting: Family Medicine

## 2019-06-05 DIAGNOSIS — J449 Chronic obstructive pulmonary disease, unspecified: Secondary | ICD-10-CM

## 2019-06-05 DIAGNOSIS — I700 Atherosclerosis of aorta: Secondary | ICD-10-CM

## 2019-06-05 DIAGNOSIS — Z7189 Other specified counseling: Secondary | ICD-10-CM

## 2019-06-05 DIAGNOSIS — E1165 Type 2 diabetes mellitus with hyperglycemia: Secondary | ICD-10-CM

## 2019-06-05 DIAGNOSIS — I10 Essential (primary) hypertension: Secondary | ICD-10-CM | POA: Diagnosis not present

## 2019-06-05 DIAGNOSIS — E1142 Type 2 diabetes mellitus with diabetic polyneuropathy: Secondary | ICD-10-CM

## 2019-06-05 DIAGNOSIS — Z794 Long term (current) use of insulin: Secondary | ICD-10-CM

## 2019-06-05 MED ORDER — TRAZODONE HCL 100 MG PO TABS: 100.0000 mg | ORAL_TABLET | Freq: Every evening | ORAL | 4 refills | Status: DC | PRN

## 2019-06-05 MED ORDER — ATORVASTATIN CALCIUM 20 MG PO TABS: 20.0000 mg | ORAL_TABLET | Freq: Every day | ORAL | 4 refills | Status: DC

## 2019-06-05 MED ORDER — DULOXETINE HCL 60 MG PO CPEP: 60.0000 mg | ORAL_CAPSULE | Freq: Every day | ORAL | 4 refills | Status: DC

## 2019-06-05 NOTE — Assessment & Plan Note (Signed)
Followed by endocrinology and some improvement

## 2019-06-05 NOTE — Assessment & Plan Note (Signed)
A voluntary discussion about advanced care planning including explanation and discussion of advanced directives was extentively discussed with the patient.  Explained about the healthcare proxy and living will was reviewed and packet with forms with expiration of how to fill them out was given.  Time spent: Encounter 16+ min individuals present: Patient 

## 2019-06-05 NOTE — Progress Notes (Signed)
There were no vitals taken for this visit.   Subjective:    Patient ID: Edward Schneider., male    DOB: 12-13-1955, 63 y.o.   MRN: [id number]  HPI: Edward Dorn. is a 63 y.o. male  Med check Discussed with patient doing well had physical last month and reviewed blood work with patient did not have some of his refills reviewed that medication and updated his refills. Patient with working with endocrinology on diabetes control still poor control but may be somewhat better. Taking cholesterol medicine without problems. Reflux also stable.   Relevant past medical, surgical, family and social history reviewed and updated as indicated. Interim medical history since our last visit reviewed. Allergies and medications reviewed and updated.  Review of Systems  Constitutional: Negative.   Respiratory: Negative.   Cardiovascular: Negative.     Per HPI unless specifically indicated above     Objective:    There were no vitals taken for this visit.  Wt Readings from Last 3 Encounters:  05/02/19 227 lb 8 oz (103.2 kg)  04/27/19 228 lb (103.4 kg)  04/25/19 227 lb 12.8 oz (103.3 kg)    Physical Exam  Results for orders placed or performed in visit on 05/17/19  CBC with Differential/Platelet  Result Value Ref Range   WBC 7.5 3.4 - 10.8 x10E3/uL   RBC 4.69 4.14 - 5.80 x10E6/uL   Hemoglobin 12.9 (L) 13.0 - 17.7 g/dL   Hematocrit 38.9 37.5 - 51.0 %   MCV 83 79 - 97 fL   MCH 27.5 26.6 - 33.0 pg   MCHC 33.2 31.5 - 35.7 g/dL   RDW 13.4 11.6 - 15.4 %   Platelets 187 150 - 450 x10E3/uL   Neutrophils 47 Not Estab. %   Lymphs 42 Not Estab. %   Monocytes 7 Not Estab. %   Eos 3 Not Estab. %   Basos 1 Not Estab. %   Neutrophils Absolute 3.5 1.4 - 7.0 x10E3/uL   Lymphocytes Absolute 3.2 (H) 0.7 - 3.1 x10E3/uL   Monocytes Absolute 0.6 0.1 - 0.9 x10E3/uL   EOS (ABSOLUTE) 0.2 0.0 - 0.4 x10E3/uL   Basophils Absolute 0.1 0.0 - 0.2 x10E3/uL   Immature Granulocytes 0 Not Estab. %   Immature Grans (Abs) 0.0 0.0 - 0.1 x10E3/uL  Comprehensive metabolic panel  Result Value Ref Range   Glucose 305 (H) 65 - 99 mg/dL   BUN 13 8 - 27 mg/dL   Creatinine, Ser 1.08 0.76 - 1.27 mg/dL   GFR calc non Af Amer 73 >59 mL/min/1.73   GFR calc Af Amer 85 >59 mL/min/1.73   BUN/Creatinine Ratio 12 10 - 24   Sodium 136 134 - 144 mmol/L   Potassium 4.5 3.5 - 5.2 mmol/L   Chloride 99 96 - 106 mmol/L   CO2 21 20 - 29 mmol/L   Calcium 9.4 8.6 - 10.2 mg/dL   Total Protein 7.2 6.0 - 8.5 g/dL   Albumin 4.2 3.8 - 4.8 g/dL   Globulin, Total 3.0 1.5 - 4.5 g/dL   Albumin/Globulin Ratio 1.4 1.2 - 2.2   Bilirubin Total 0.6 0.0 - 1.2 mg/dL   Alkaline Phosphatase 86 39 - 117 IU/L   AST 43 (H) 0 - 40 IU/L   ALT 40 0 - 44 IU/L  TSH  Result Value Ref Range   TSH 2.350 0.450 - 4.500 uIU/mL  Lipid Panel w/o Chol/HDL Ratio  Result Value Ref Range   Cholesterol, Total 191 100 -  199 mg/dL   Triglycerides 182 (H) 0 - 149 mg/dL   HDL 46 >39 mg/dL   VLDL Cholesterol Cal 36 5 - 40 mg/dL   LDL Calculated 109 (H) 0 - 99 mg/dL  PSA  Result Value Ref Range   Prostate Specific Ag, Serum 0.3 0.0 - 4.0 ng/mL      Assessment & Plan:   Problem List Items Addressed This Visit      Cardiovascular and Mediastinum   Essential hypertension    The current medical regimen is effective;  continue present plan and medications.       Relevant Medications   atorvastatin (LIPITOR) 20 MG tablet   Atherosclerosis of abdominal aorta (HCC)    The current medical regimen is effective;  continue present plan and medications.       Relevant Medications   atorvastatin (LIPITOR) 20 MG tablet     Respiratory   COPD (chronic obstructive pulmonary disease) ([place])    The current medical regimen is effective;  continue present plan and medications.         Endocrine   Type 2 diabetes mellitus with hyperglycemia, with long-term current use of insulin (HCC)    Followed by endocrinology and some improvement       Relevant Medications   atorvastatin (LIPITOR) 20 MG tablet     Nervous and Auditory   DM type 2 with diabetic peripheral neuropathy (HCC)   Relevant Medications   traZODone (DESYREL) 100 MG tablet   atorvastatin (LIPITOR) 20 MG tablet   DULoxetine (CYMBALTA) 60 MG capsule     Other   Advanced care planning/counseling discussion    A voluntary discussion about advanced care planning including explanation and discussion of advanced directives was extentively discussed with the patient.  Explained about the healthcare proxy and living will was reviewed and packet with forms with expiration of how to fill them out was given.  Time spent: Encounter 16+ min individuals present: Patient         Telemedicine using audio/video telecommunications for a synchronous communication visit. Today's visit due to COVID-19 isolation precautions I connected with and verified that I am speaking with the correct person using two identifiers.   I discussed the limitations, risks, security and privacy concerns of performing an evaluation and management service by telecommunication and the availability of in person appointments. I also discussed with the patient that there may be a patient responsible charge related to this service. The patient expressed understanding and agreed to proceed. The patient's location is home. I am at home.   I discussed the assessment and treatment plan with the patient. The patient was provided an opportunity to ask questions and all were answered. The patient agreed with the plan and demonstrated an understanding of the instructions.   The patient was advised to call back or seek an in-person evaluation if the symptoms worsen or if the condition fails to improve as anticipated.   I provided 21+ minutes of time during this encounter.  Follow up plan: Return in about 6 months (around 12/06/2019) for BMP,  Lipids, ALT, AST.

## 2019-06-05 NOTE — Assessment & Plan Note (Signed)
The current medical regimen is effective;  continue present plan and medications.  

## 2019-06-07 DIAGNOSIS — L638 Other alopecia areata: Secondary | ICD-10-CM | POA: Diagnosis not present

## 2019-06-07 DIAGNOSIS — L281 Prurigo nodularis: Secondary | ICD-10-CM | POA: Diagnosis not present

## 2019-06-08 ENCOUNTER — Other Ambulatory Visit: Payer: Self-pay | Admitting: Pharmacy Technician

## 2019-06-08 NOTE — Patient Outreach (Signed)
[place] [facility]) Care Management  06/08/2019  Amani Nodarse. 12/08/1955 [id number]    Received all necessary documents and signatures from both patient and provider for [place] patient assistance for Anoro and Ventolin HFA.  Submitted completed application via fax.  Will followup with [place] in 2-5 business days.  Herald Vallin P. Yatziry Deakins, [place] Management [phone]

## 2019-06-12 ENCOUNTER — Other Ambulatory Visit: Payer: Self-pay | Admitting: Pharmacy Technician

## 2019-06-12 DIAGNOSIS — G629 Polyneuropathy, unspecified: Secondary | ICD-10-CM | POA: Diagnosis not present

## 2019-06-12 DIAGNOSIS — M179 Osteoarthritis of knee, unspecified: Secondary | ICD-10-CM | POA: Diagnosis not present

## 2019-06-12 DIAGNOSIS — E11610 Type 2 diabetes mellitus with diabetic neuropathic arthropathy: Secondary | ICD-10-CM | POA: Diagnosis not present

## 2019-06-12 DIAGNOSIS — Z5181 Encounter for therapeutic drug level monitoring: Secondary | ICD-10-CM | POA: Diagnosis not present

## 2019-06-12 NOTE — Patient Outreach (Signed)
[place]) Care Management  06/12/2019  Cuinn Bechel. May 03, 1956 [id number]   Care coordination call placed to [place] in regards to patient's application for Anoro and Ventolin HFA.  Spoke to [place] who informed patient had been APPROVED 06/10/2019-10/19/2019. She informed an order was been sent to the pharmacy and it can take them up to 5 business days to process the order.. Once processed,  order will be shipped which can take 7-10 business days.  Will followup with patient in 10-15 business days to confirm mediation was received.  Javia Dillow P. Hellena Pridgen, [place] Management [phone]

## 2019-06-23 ENCOUNTER — Other Ambulatory Visit: Payer: Self-pay | Admitting: Pharmacy Technician

## 2019-06-23 NOTE — Patient Outreach (Signed)
[place] [facility]) Care Management  06/23/2019  Edward Schneider. 02-27-1956 [id number]  ADDENDUM  Successful outreach call placed to patient in regards to [place] application for Anoro and Ventolin.  Spoke to patient, HIPAA identifiers verified.  Patient informed he did receive 6 inhalers, 3 Ventolin and 3 Anoro. Discussed refill process with patient and advised to call [place] when he is down to around a 2-3 week supply to allow for processing and avoid a delay in therapy. Informed patient where he could find the number to phone in the refills. Patient verbalized understanding.  Patient informed he had no other questions or concerns relating to these medications. Confirmed patient had name and number.  Will route note to embedded [facility] RPh Catie Darnelle Maffucci that patient assistance has been completed and will remove myself from care team.  Edward Schneider. Edward Schneider, [place] Management [phone]

## 2019-06-23 NOTE — Patient Outreach (Signed)
[place] [facility]) Care Management  06/23/2019  Edward Schneider. 13-Mar-1956 [id number]  Care coordination call placed to [place] in regards to patient's application for Anoro and Ventolin.  Spoke to [place] at [employer] who informed 3 inhalers of Anoro and 3 inhalers of Ventolin  Were shipped on 06/14/2019 and delivered on 06/19/2019.  Will followup with patient.  Price Lachapelle P. Mallary Kreger, [place] Management [phone]

## 2019-07-05 DIAGNOSIS — L281 Prurigo nodularis: Secondary | ICD-10-CM | POA: Diagnosis not present

## 2019-07-05 DIAGNOSIS — L638 Other alopecia areata: Secondary | ICD-10-CM | POA: Diagnosis not present

## 2019-07-10 DIAGNOSIS — M792 Neuralgia and neuritis, unspecified: Secondary | ICD-10-CM | POA: Diagnosis not present

## 2019-07-10 DIAGNOSIS — G894 Chronic pain syndrome: Secondary | ICD-10-CM | POA: Diagnosis not present

## 2019-07-10 DIAGNOSIS — G8929 Other chronic pain: Secondary | ICD-10-CM | POA: Diagnosis not present

## 2019-07-10 DIAGNOSIS — M47897 Other spondylosis, lumbosacral region: Secondary | ICD-10-CM | POA: Diagnosis not present

## 2019-07-10 DIAGNOSIS — M4807 Spinal stenosis, lumbosacral region: Secondary | ICD-10-CM | POA: Diagnosis not present

## 2019-07-10 DIAGNOSIS — M48062 Spinal stenosis, lumbar region with neurogenic claudication: Secondary | ICD-10-CM | POA: Diagnosis not present

## 2019-07-14 ENCOUNTER — Telehealth: Payer: Self-pay | Admitting: Family Medicine

## 2019-07-14 NOTE — Telephone Encounter (Signed)
Medication Refill - Medication: Pt is requesting medication for hemorrhoids, he is in excruciating pain. Advised to consult urgent care while he awaits fu from office     Has the patient contacted their pharmacy? No. (Agent: If no, request that the patient contact the pharmacy for the refill.) (Agent: If yes, when and what did the pharmacy advise?)  Preferred Pharmacy (with phone number or street name):  [place], [place]  [place] [address]  Phone: [phone] Fax: [phone]     Agent: Please be advised that RX refills may take up to 3 business days. We ask that you follow-up with your pharmacy.

## 2019-07-14 NOTE — Telephone Encounter (Signed)
Routing to provider to advise. Does patient need to come in for an appointment?

## 2019-07-17 NOTE — Telephone Encounter (Signed)
Patient notified, patient is better right now, will schedule if it happens again.

## 2019-07-17 NOTE — Telephone Encounter (Signed)
Call pt  He probably has thrombosed hemorrhoids and will need incision and drainage procedure done.  This is done in the office and so he will need an appointment.

## 2019-07-18 NOTE — Telephone Encounter (Signed)
Error

## 2019-07-19 ENCOUNTER — Telehealth: Payer: Self-pay | Admitting: Family Medicine

## 2019-07-19 NOTE — Telephone Encounter (Signed)
Patient requesting BP medication (caller unaware of Rx name) informed patient please allow 48 to 72 hour turn around.  [facility] [address] [place] [phone] (Phone) [phone] (Fax)

## 2019-07-24 ENCOUNTER — Other Ambulatory Visit: Payer: Self-pay | Admitting: Family Medicine

## 2019-07-24 MED ORDER — BENAZEPRIL HCL 20 MG PO TABS: 20.0000 mg | ORAL_TABLET | Freq: Every day | ORAL | 3 refills | Status: DC

## 2019-07-24 NOTE — Telephone Encounter (Signed)
And deleted medication section he was on Benzapril which I called in again for him.  It is a good idea whether he has blood pressure issues or not as it is renal protection.

## 2019-07-24 NOTE — Telephone Encounter (Signed)
Routing to provider. Dr. Jeananne Rama, I do not see any HTN meds in the patient's chart. Is he supposed to be taking one?

## 2019-07-24 NOTE — Telephone Encounter (Signed)
Patient notified

## 2019-07-26 ENCOUNTER — Other Ambulatory Visit: Payer: Self-pay

## 2019-07-26 ENCOUNTER — Ambulatory Visit: Payer: Self-pay | Admitting: Pharmacist

## 2019-07-26 ENCOUNTER — Ambulatory Visit (INDEPENDENT_AMBULATORY_CARE_PROVIDER_SITE_OTHER): Payer: Medicare Other | Admitting: Internal Medicine

## 2019-07-26 ENCOUNTER — Encounter: Payer: Self-pay | Admitting: Internal Medicine

## 2019-07-26 ENCOUNTER — Telehealth: Payer: Self-pay | Admitting: Internal Medicine

## 2019-07-26 VITALS — BP 124/64 | HR 67 | Temp 97.7°F | Ht 68.0 in | Wt 234.0 lb

## 2019-07-26 DIAGNOSIS — E1165 Type 2 diabetes mellitus with hyperglycemia: Secondary | ICD-10-CM | POA: Diagnosis not present

## 2019-07-26 DIAGNOSIS — E1159 Type 2 diabetes mellitus with other circulatory complications: Secondary | ICD-10-CM

## 2019-07-26 DIAGNOSIS — E1142 Type 2 diabetes mellitus with diabetic polyneuropathy: Secondary | ICD-10-CM

## 2019-07-26 LAB — POCT GLYCOSYLATED HEMOGLOBIN (HGB A1C): Hemoglobin A1C: 13.7 % — AB (ref 4.0–5.6)

## 2019-07-26 MED ORDER — TRULICITY 0.75 MG/0.5ML ~~LOC~~ SOAJ: 0.7500 mg | SUBCUTANEOUS | 3 refills | Status: DC

## 2019-07-26 MED ORDER — OZEMPIC (0.25 OR 0.5 MG/DOSE) 2 MG/1.5ML ~~LOC~~ SOPN: 0.2500 mg | PEN_INJECTOR | SUBCUTANEOUS | 3 refills | Status: DC

## 2019-07-26 MED ORDER — TRESIBA FLEXTOUCH 100 UNIT/ML ~~LOC~~ SOPN: 24.0000 [IU] | PEN_INJECTOR | Freq: Every day | SUBCUTANEOUS | 3 refills | Status: DC

## 2019-07-26 MED ORDER — INSULIN ASPART 100 UNIT/ML FLEXPEN: 14.0000 [IU] | PEN_INJECTOR | Freq: Three times a day (TID) | SUBCUTANEOUS | 11 refills | Status: DC

## 2019-07-26 MED ORDER — TRESIBA FLEXTOUCH 100 UNIT/ML ~~LOC~~ SOPN: 30.0000 [IU] | PEN_INJECTOR | Freq: Every day | SUBCUTANEOUS | 11 refills | Status: DC

## 2019-07-26 NOTE — Telephone Encounter (Signed)
Pt called stating that the medication that was called in today all together is all of 500.00 pt states he cannot afford it. Please advise?  Call pt @ [phone]. Thank you!

## 2019-07-26 NOTE — Patient Instructions (Addendum)
-   Start Trulicity [id number] mg weekly  - Increase tresiba to 30 units daily  - Increase Humalog to 14 units with each meal     -HOW TO TREAT LOW BLOOD SUGARS (Blood sugar LESS THAN 70 MG/DL)  Please follow the RULE OF 15 for the treatment of hypoglycemia treatment (when your (blood sugars are less than 70 mg/dL)    STEP 1: Take 15 grams of carbohydrates when your blood sugar is low, which includes:   3-4 GLUCOSE TABS  OR  3-4 OZ OF JUICE OR REGULAR SODA OR  ONE TUBE OF GLUCOSE GEL     STEP 2: RECHECK blood sugar in 15 MINUTES STEP 3: If your blood sugar is still low at the 15 minute recheck --> then, go back to STEP 1 and treat AGAIN with another 15 grams of carbohydrates.

## 2019-07-26 NOTE — Patient Instructions (Signed)
Visit Information  Goals Addressed            This Visit's Progress     Patient Stated   . "I can't afford my medications (pt-stated)       Current Barriers:  . Financial concerns - alleviated; patient receiving Tyler Aas, Novolog from [employer] patient assistance, and Anoro/Ventolin patient assistance from [employer] o Patient saw endocrinology today, and had Tresiba/Novolog doses increased, as well as Trulicity added  Pharmacist Clinical Goal(s):  Marland Kitchen Over the next 90 days, patient will work with PharmD to address needs related to medication access  Interventions: . Contacted Dr. Kelton Pillar; noted that patient would be able to receive Ozempic for free from patient assistance. She agreed to switch Trulicity to [employer].  Marland Kitchen [employer]; confirmed that a Refill/Reorder form would suffice to add the Ozempic. Sent this url to Dr. Quin Hoop CMA, Lolita Rieger. Requested that they ensure that patient understands not to pick up these medications from the pharmacy, and instead wait for order from [employer].   Patient Self Care Activities:  . Self administers medications as prescribed . Calls pharmacy for medication refills  Please see past updates related to this goal by clicking on the "Past Updates" button in the selected goal         The patient verbalized understanding of instructions provided today and declined a print copy of patient instruction materials.     Plan: - Will f/u with patient regarding medication changes in 1-2 weeks  Catie Darnelle Maffucci, PharmD Clinical Pharmacist [place] [phone]

## 2019-07-26 NOTE — Telephone Encounter (Signed)
Spoke to pt and informed him that all medications have been sent to his pt assistance program.

## 2019-07-26 NOTE — Chronic Care Management (AMB) (Signed)
Chronic Care Management   Follow Up Note   07/26/2019 Name: Edward Schneider. MRN: [id number] DOB: [birth year]  Referred by: Edward Maple, MD Reason for referral : Chronic Care Management (Medication Management)   Edward Schneider. is a 63 y.o. year old male who is a primary care patient of Edward Schneider, Edward How, MD. The CCM team was consulted for assistance with chronic disease management and care coordination needs.    Care coordination completed today.  Review of patient status, including review of consultants reports, relevant laboratory and other test results, and collaboration with appropriate care team members and the patient's provider was performed as part of comprehensive patient evaluation and provision of chronic care management services.    SDOH (Social Determinants of Health) screening performed today: Financial Strain . See Care Plan for related entries.   Advanced Directives Status: N See Care Plan and Vynca application for related entries.  Outpatient Encounter Medications as of 07/26/2019  Medication Sig  . ACCU-CHEK SOFTCLIX LANCETS lancets USE TO CHECK GLUCOSE ONCE DAILY  . albuterol (VENTOLIN HFA) 108 (90 Base) MCG/ACT inhaler Inhale 2 puffs into the lungs every 6 (six) hours as needed for wheezing or shortness of breath.  . Ascorbic Acid (VITAMIN C) 1000 MG tablet Take 1,000 mg by mouth daily.  Marland Kitchen aspirin EC 81 MG tablet Take 162 mg by mouth daily.   Marland Kitchen atorvastatin (LIPITOR) 20 MG tablet Take 1 tablet (20 mg total) by mouth daily.  . benazepril (LOTENSIN) 20 MG tablet Take 1 tablet (20 mg total) by mouth daily.  . Cholecalciferol (VITAMIN D3) 125 MCG (5000 UT) CAPS Take by mouth.  Marland Kitchen CINNAMON PO Take 2,400 mg by mouth daily.  . cyclobenzaprine (FLEXERIL) 10 MG tablet Take 1 tablet (10 mg total) by mouth 3 (three) times daily as needed for muscle spasms.  . diclofenac sodium (VOLTAREN) 1 % GEL Apply 2 g topically 4 (four) times daily.  . diphenhydrAMINE (BENADRYL) 25  mg capsule Take 25 mg by mouth 2 (two) times daily.   . diphenoxylate-atropine (LOMOTIL) 2.5-0.025 MG tablet Take 1 tablet by mouth 4 (four) times daily as needed for diarrhea or loose stools.  . Dulaglutide (TRULICITY) 2.19 XJ/8.8TG SOPN Inject 0.75 mg into the skin once a week.  . DULoxetine (CYMBALTA) 60 MG capsule Take 1 capsule (60 mg total) by mouth daily.  Marland Kitchen EPINEPHrine (EPIPEN IJ) Inject as directed.  . Ginger, Zingiber officinalis, (GINGER PO) Take 1 Dose by mouth daily.  . Ginseng 100 MG CAPS Take by mouth.  Marland Kitchen glucose blood (ACCU-CHEK AVIVA PLUS) test strip 1 each by Other route as directed. Use as instructed  . insulin aspart (NOVOLOG) 100 UNIT/ML FlexPen Inject 14 Units into the skin 3 (three) times daily with meals.  . Insulin Pen Needle (B-D UF III MINI PEN NEEDLES) 31G X 5 MM MISC Daily  . mupirocin ointment (BACTROBAN) 2 % APPLY OINTMENT TOPICALLY TWICE DAILY  . NARCAN 4 MG/0.1ML LIQD nasal spray kit CALL 911. ADMINISTER A SINGLE SPRAY OF NARCAN IN ONE NOSTRIL. REPEAT EVERY 3 MINUTES AS NEEDED IF NO OR MINIMAL RESPONSE.  Marland Kitchen Omega-3 1000 MG CAPS Take by mouth.  . oxyCODONE (ROXICODONE) 15 MG immediate release tablet TAKE 1 2 TO 1 (ONE HALF TO ONE) TABLET BY MOUTH FOUR TO SIX TIMES DAILY IF TOLERTED NOTE TABLET IS 15 MG  . pantoprazole (PROTONIX) 40 MG tablet Take 1 tablet by mouth twice daily  . pregabalin (LYRICA) 50 MG capsule TAKE 1  CAPSULE BY MOUTH THREE TIMES DAILY  . Semaglutide,0.25 or 0.5MG/DOS, (OZEMPIC, 0.25 OR 0.5 MG/DOSE,) 2 MG/1.5ML SOPN Inject 0.25 mg into the skin once a week. Start at  . sucralfate (CARAFATE) 1 g tablet TAKE 1 TABLET BY MOUTH 4 TIMES DAILY... WITH MEALS AND AT BEDTIME  . traZODone (DESYREL) 100 MG tablet Take 1 tablet (100 mg total) by mouth at bedtime as needed for sleep.  Marland Kitchen triamcinolone cream (KENALOG) 0.1 % Apply 1 application topically 2 (two) times daily.  Marland Kitchen umeclidinium-vilanterol (ANORO ELLIPTA) 62.5-25 MCG/INH AEPB Inhale 1 puff into the  lungs daily.  . vitamin A 10000 UNIT capsule Take 10,000 Units by mouth daily.  . vitamin B-12 (CYANOCOBALAMIN) 1000 MCG tablet Take 1,000 mcg by mouth daily.  . vitamin E 1000 UNIT capsule Take 1,000 Units by mouth daily.   No facility-administered encounter medications on file as of 07/26/2019.      Goals Addressed            This Visit's Progress     Patient Stated   . "I can't afford my medications (pt-stated)       Current Barriers:  . Financial concerns - alleviated; patient receiving Tyler Aas, Novolog from [employer] patient assistance, and Anoro/Ventolin patient assistance from [employer] o Patient saw endocrinology today, and had Tresiba/Novolog doses increased, as well as Trulicity added  Pharmacist Clinical Goal(s):  Marland Kitchen Over the next 90 days, patient will work with PharmD to address needs related to medication access  Interventions: . Contacted Dr. Kelton Schneider; noted that patient would be able to receive Ozempic for free from patient assistance. She agreed to switch Trulicity to [employer].  Marland Kitchen [employer]; confirmed that a Refill/Reorder form would suffice to add the Ozempic. Sent this url to Dr. Quin Schneider CMA, Edward Schneider. Requested that they ensure that patient understands not to pick up these medications from the pharmacy, and instead wait for order from [employer].   Patient Self Care Activities:  . Self administers medications as prescribed . Calls pharmacy for medication refills  Please see past updates related to this goal by clicking on the "Past Updates" button in the selected goal         Plan: - Will f/u with patient regarding medication changes in 1-2 weeks  Edward Schneider, PharmD Clinical Pharmacist [place] [phone]

## 2019-07-26 NOTE — Progress Notes (Signed)
Name: Edward Schneider.  Age/ Sex: 63 y.o., male   MRN/ DOB: [id number], 1955-11-09     PCP: Guadalupe Maple, MD   Reason for Endocrinology Evaluation: Type 2 Diabetes Mellitus  Initial Endocrine Consultative Visit: 01/24/2019    PATIENT IDENTIFIER: Edward Schneider. is a 63 y.o. male with a past medical history of T2DM. The patient has followed with Endocrinology clinic since 01/24/2019 for consultative assistance with management of his diabetes.  DIABETIC HISTORY:  Edward Schneider was diagnosed with T2DM in 2017. He has been on Jardiance in 2017 but due to cost was discontinued, as well as [place]. His hemoglobin A1c has ranged from 6.2% in 2018, peaking at 10.0 % in 2019.  On his initial visit to our clinic his A1c 10.9% , he was on metformin which we stopped in 04/2019 due to diarrhea.   SUBJECTIVE:   During the last visit (04/25/2019): A1c 11.8%. We increased [place] and continued humalog.     Today (07/26/2019): Edward Schneider is here for a 3 month follow up on his diabetes management.  He checks his blood sugars 2 times daily, preprandial to breakfast and bedtime. The patient has not had hypoglycemic episodes since the last clinic visit. Otherwise, the patient has not required any recent emergency interventions for hypoglycemia and has not had recent hospitalizations secondary to hyper or hypoglycemic episodes.   He used to drink 2-4 liter  Soda a day and now he cut it down to 3 cans of 12 ounce regular soda a day    ROS: As per HPI and as detailed below: Review of Systems  Constitutional: Negative for fever and weight loss.  HENT: Negative for congestion and sore throat.   Respiratory: Negative for cough and shortness of breath.   Cardiovascular: Negative for chest pain.  Gastrointestinal: Negative for diarrhea and nausea.       Sometimes associated with diet   Genitourinary: Negative for urgency.  Neurological: Positive for tingling. Negative for tremors.  Endo/Heme/Allergies:  Negative for polydipsia.      HOME DIABETES REGIMEN:   Tresiba 24 units QHS  Novolog 10 units TID QAC     METER DOWNLOAD SUMMARY: 9/8-10/04/2019 Fingerstick Blood Glucose Tests = 8 Average Number Tests/Day = 0.3 Overall Mean FS Glucose = 303 Standard Deviation = 81.4  BG Ranges: Low = 142 High = 405  Hypoglycemic Events/30 Days: BG < 50 = 0 Episodes of symptomatic severe hypoglycemia = 0     DIABETIC COMPLICATIONS: Microvascular complications:   Neuropathy   Denies: CKD, retinopathy   Last eye exam: Completed 12/2018  Macrovascular complications:   CAD and CVA  Denies: PVD   HISTORY:  Past Medical History:  Past Medical History:  Diagnosis Date  . Allergy   . Asthma   . C. difficile diarrhea   . Cancer [facility]) June 2016   liver cancer  . Chronic pain   . DDD (degenerative disc disease), cervical   . DDD (degenerative disc disease), lumbar   . Diabetes mellitus without complication ([place])   . GERD (gastroesophageal reflux disease)   . Headache    migraines - none since 02/17  . Hypertension   . Low blood sugar   . Myocardial infarction ([place])   . Seizures ([place])    several as child when sick.  None since age 62  . Stroke [facility])    'mini-stroke" 30 yrs ago. no deficits.  . Wears dentures    full upper and lower  Past Surgical History:  Past Surgical History:  Procedure Laterality Date  . APPENDECTOMY    . BACK SURGERY    . CARDIAC CATHETERIZATION     stent placed in his "30's"  . COLONOSCOPY WITH PROPOFOL N/A 03/06/2016   Procedure: COLONOSCOPY WITH PROPOFOL;  Surgeon: Lucilla Lame, MD;  Location: [place];  Service: Endoscopy;  Laterality: N/A;  requests early  . ESOPHAGOGASTRODUODENOSCOPY (EGD) WITH PROPOFOL N/A 09/20/2017   Procedure: ESOPHAGOGASTRODUODENOSCOPY (EGD) WITH PROPOFOL;  Surgeon: Lucilla Lame, MD;  Location: [place];  Service: Endoscopy;  Laterality: N/A;  Diabetic - oral meds  . FINGER SURGERY Left   .  KNEE SURGERY Right   . NECK SURGERY    . spleen surg    . spleen surgery    . TOE SURGERY Right     Social History:  reports that he quit smoking about 7 years ago. His smoking use included cigarettes. He has a 100.00 pack-year smoking history. He has never used smokeless tobacco. He reports that he does not drink alcohol or use drugs. Family History:  Family History  Problem Relation Age of Onset  . Arthritis Mother   . Diabetes Mother   . Kidney disease Mother   . Heart disease Mother   . Hypertension Mother   . Arthritis Father   . Hearing loss Father   . Hypertension Father   . Diabetes Sister   . Heart disease Sister   . Diabetes Daughter   . Diabetes Maternal Aunt   . Diabetes Maternal Grandmother      HOME MEDICATIONS: Allergies as of 07/26/2019      Reactions   Bee Pollen Anaphylaxis   Died 3 times when stung by bees. Carries Epi-pen at all times.   Bee Venom Anaphylaxis   Crestor [rosuvastatin Calcium] Shortness Of Breath, Swelling   Fentanyl Itching, Hives   blisters Patch   Gabapentin Diarrhea   Severe diarrhea which caused incontinence, loss of appetite and weight loss.   Shellfish Allergy Anaphylaxis, Swelling   Shrimp causes throat to swell and tingling in tongue. Can eat other white fish, crabcakes, and oysters.   Buprenorphine Hcl Itching   Morphine Itching   Simvastatin Diarrhea      Medication List       Accurate as of July 26, 2019  8:28 AM. If you have any questions, ask your nurse or doctor.        Accu-Chek Aviva Plus test strip Generic drug: glucose blood 1 each by Other route as directed. Use as instructed   Accu-Chek Softclix Lancets lancets USE TO CHECK GLUCOSE ONCE DAILY   albuterol 108 (90 Base) MCG/ACT inhaler Commonly known as: Ventolin HFA Inhale 2 puffs into the lungs every 6 (six) hours as needed for wheezing or shortness of breath.   Anoro Ellipta 62.5-25 MCG/INH Aepb Generic drug: umeclidinium-vilanterol Inhale 1  puff into the lungs daily.   aspirin EC 81 MG tablet Take 162 mg by mouth daily.   atorvastatin 20 MG tablet Commonly known as: LIPITOR Take 1 tablet (20 mg total) by mouth daily.   benazepril 20 MG tablet Commonly known as: LOTENSIN Take 1 tablet (20 mg total) by mouth daily.   CINNAMON PO Take 2,400 mg by mouth daily.   cyclobenzaprine 10 MG tablet Commonly known as: FLEXERIL Take 1 tablet (10 mg total) by mouth 3 (three) times daily as needed for muscle spasms.   diclofenac sodium 1 % Gel Commonly known as: VOLTAREN Apply 2 g  topically 4 (four) times daily.   diphenhydrAMINE 25 mg capsule Commonly known as: BENADRYL Take 25 mg by mouth 2 (two) times daily.   diphenoxylate-atropine 2.5-0.025 MG tablet Commonly known as: Lomotil Take 1 tablet by mouth 4 (four) times daily as needed for diarrhea or loose stools.   DULoxetine 60 MG capsule Commonly known as: CYMBALTA Take 1 capsule (60 mg total) by mouth daily.   EPIPEN IJ Inject as directed.   GINGER PO Take 1 Dose by mouth daily.   Ginseng 100 MG Caps Take by mouth.   insulin aspart 100 UNIT/ML FlexPen Commonly known as: NOVOLOG Inject 8-10 Units into the skin 3 (three) times daily with meals.   Insulin Pen Needle 31G X 5 MM Misc Commonly known as: B-D UF III MINI PEN NEEDLES Daily   mupirocin ointment 2 % Commonly known as: BACTROBAN APPLY OINTMENT TOPICALLY TWICE DAILY   Narcan 4 MG/0.1ML Liqd nasal spray kit Generic drug: naloxone CALL 911. ADMINISTER A SINGLE SPRAY OF NARCAN IN ONE NOSTRIL. REPEAT EVERY 3 MINUTES AS NEEDED IF NO OR MINIMAL RESPONSE.   Omega-3 1000 MG Caps Take by mouth.   oxyCODONE 15 MG immediate release tablet Commonly known as: ROXICODONE TAKE 1 2 TO 1 (ONE HALF TO ONE) TABLET BY MOUTH FOUR TO SIX TIMES DAILY IF TOLERTED NOTE TABLET IS 15 MG   pantoprazole 40 MG tablet Commonly known as: PROTONIX Take 1 tablet by mouth twice daily   pregabalin 50 MG capsule Commonly  known as: LYRICA TAKE 1 CAPSULE BY MOUTH THREE TIMES DAILY   sucralfate 1 g tablet Commonly known as: CARAFATE TAKE 1 TABLET BY MOUTH 4 TIMES DAILY... WITH MEALS AND AT BEDTIME   traZODone 100 MG tablet Commonly known as: DESYREL Take 1 tablet (100 mg total) by mouth at bedtime as needed for sleep.   Tyler Aas FlexTouch 100 UNIT/ML Sopn FlexTouch Pen Generic drug: insulin degludec Inject 0.24 mLs (24 Units total) into the skin at bedtime.   triamcinolone cream 0.1 % Commonly known as: KENALOG Apply 1 application topically 2 (two) times daily.   vitamin A 10000 UNIT capsule Take 10,000 Units by mouth daily.   vitamin B-12 1000 MCG tablet Commonly known as: CYANOCOBALAMIN Take 1,000 mcg by mouth daily.   vitamin C 1000 MG tablet Take 1,000 mg by mouth daily.   Vitamin D3 125 MCG (5000 UT) Caps Take by mouth.   vitamin E 1000 UNIT capsule Take 1,000 Units by mouth daily.        OBJECTIVE:   Vital Signs: BP 124/64 (BP Location: Left Arm, Patient Position: Sitting, Cuff Size: Large)   Pulse 67   Temp 97.7 F (36.5 C)   Ht 5' 8"  (1.727 m)   Wt 234 lb (106.1 kg)   SpO2 97%   BMI 35.58 kg/m   Wt Readings from Last 3 Encounters:  07/26/19 234 lb (106.1 kg)  05/02/19 227 lb 8 oz (103.2 kg)  04/27/19 228 lb (103.4 kg)     Exam: General: Pt appears well and is in NAD  Neck: General: Supple without adenopathy. Thyroid: Thyroid size normal.  No goiter or nodules appreciated. No thyroid bruit.  Lungs: Clear with good BS bilat with no rales, rhonchi, or wheezes  Heart: RRR with normal S1 and S2 and no gallops; no murmurs; no rub  Abdomen: Normoactive bowel sounds, soft, nontender, without masses or organomegaly palpable  Extremities: No pretibial edema. No tremor.   Skin: Normal texture and temperature to palpation. No  rash noted. No Acanthosis nigricans/skin tags.   Neuro: MS is good with appropriate affect, pt is alert and Ox3       DM foot exam: 01/24/2019  The  skin of the feet is without sores or ulcerations.Callous formation is noted bilaterally. Right 2nd toe deformity noted  The pedal pulses are 2+ on right and 2+ on left. The sensation is absent to a screening 5.07, 10 gram monofilament bilaterally         DATA REVIEWED:  Lab Results  Component Value Date   HGBA1C 11.8 (A) 04/25/2019   HGBA1C 10.9 (A) 01/24/2019   HGBA1C 9.3 (H) 08/03/2018   Lab Results  Component Value Date   MICROALBUR 80 (H) 04/27/2018   LDLCALC 109 (H) 05/17/2019   CREATININE 1.08 05/17/2019   Lab Results  Component Value Date   MICRALBCREAT <30 04/27/2018     Lab Results  Component Value Date   CHOL 191 05/17/2019   HDL 46 05/17/2019   LDLCALC 109 (H) 05/17/2019   TRIG 182 (H) 05/17/2019   CHOLHDL 3.6 04/27/2018         ASSESSMENT / PLAN / RECOMMENDATIONS:   1) 1) Type 2 Diabetes Mellitus, Poorly controlled, With neuropathic and macrovascular complications - Most recent A1c of 13.7 %. Goal A1c < 7.0 %.     - Pt continues with hyperglycemia, pt admits to dietary indiscretions, he is compliant with insulin intake but its not enough to cover his hyperglycemia.  - We had to stop Metformin due to intolerance - We discussed adding a GLP-1 agonist to improve his insulin sensitivity since he has been off Metformin.  - I have encouraged him to switch those regular sodas to diet sodas. Discussed the importance of dietary discretions in improving glycemic control  - Cautioned against GI side effects with GLP-1 agonists   Plan: MEDICATIONS:   Increase tresiba to 30 units daily   Start Humalog14 units TIDQAC  Ozempic 0.25 mg weekly   EDUCATION / INSTRUCTIONS:  BG monitoring instructions: Patient is instructed to check his blood sugars 4 times a day, before meals .  Call [facility] if: BG persistently < 70 or > 300. . I reviewed the Rule of 15 for the treatment of hypoglycemia in detail with the patient. Literature supplied.     F/U in  3 months    Signed electronically by: Mack Guise, MD  [facility]  [facility] [address] [place], [place] [address] Phone: [phone] FAX: [phone]   CC: Guadalupe Maple, MD [place] [address] Phone: [phone]  Fax: [phone]  Return to Endocrinology clinic as below: Future Appointments  Date Time Provider [place]  07/26/2019  8:30 AM Shamleffer, Melanie Crazier, MD [facility] None  08/01/2019 11:00 AM [facility] PHARMACY [facility]  08/10/2019  3:15 PM Gardiner Barefoot, DPM [facility]  08/18/2019  3:30 PM [name], NP [facility]

## 2019-07-31 ENCOUNTER — Other Ambulatory Visit: Payer: Self-pay | Admitting: Family Medicine

## 2019-08-01 ENCOUNTER — Telehealth: Payer: Self-pay

## 2019-08-01 ENCOUNTER — Ambulatory Visit: Payer: Self-pay | Admitting: Pharmacist

## 2019-08-01 NOTE — Chronic Care Management (AMB) (Signed)
  Chronic Care Management   Note  08/01/2019 Name: Edward Schneider. MRN: [id number] DOB: [birth year]  Edward Schneider. is a 63 y.o. year old male who is a primary care patient of Crissman, Jeannette How, MD. The CCM team was consulted for assistance with chronic disease management and care coordination needs.    Attempted to contact patient to follow up on recent endocrinology appointment and medication changes. Left HIPAA compliant message for patient to return my call at his convenience.   Follow up plan: - If I do not hear back, will outreach again in the next 4-5 weeks for continued medication management support  Catie Darnelle Maffucci, PharmD Clinical Pharmacist [place] [phone]

## 2019-08-04 ENCOUNTER — Telehealth: Payer: Self-pay

## 2019-08-04 NOTE — Telephone Encounter (Signed)
Pt informed that his pt assistance ozempic had arrived and was ready for pick up

## 2019-08-07 DIAGNOSIS — G894 Chronic pain syndrome: Secondary | ICD-10-CM | POA: Diagnosis not present

## 2019-08-10 ENCOUNTER — Other Ambulatory Visit: Payer: Self-pay

## 2019-08-10 ENCOUNTER — Ambulatory Visit (INDEPENDENT_AMBULATORY_CARE_PROVIDER_SITE_OTHER): Payer: Medicare Other | Admitting: Podiatry

## 2019-08-10 ENCOUNTER — Encounter: Payer: Self-pay | Admitting: Podiatry

## 2019-08-10 DIAGNOSIS — M79674 Pain in right toe(s): Secondary | ICD-10-CM | POA: Insufficient documentation

## 2019-08-10 DIAGNOSIS — E1142 Type 2 diabetes mellitus with diabetic polyneuropathy: Secondary | ICD-10-CM | POA: Diagnosis not present

## 2019-08-10 DIAGNOSIS — B351 Tinea unguium: Secondary | ICD-10-CM | POA: Diagnosis not present

## 2019-08-10 DIAGNOSIS — E1140 Type 2 diabetes mellitus with diabetic neuropathy, unspecified: Secondary | ICD-10-CM | POA: Insufficient documentation

## 2019-08-10 DIAGNOSIS — M79675 Pain in left toe(s): Secondary | ICD-10-CM | POA: Diagnosis not present

## 2019-08-10 NOTE — Progress Notes (Signed)
Complaint:  Visit Type: Patient returns to my office for continued preventative foot care services. Complaint: Patient states" my nails have grown long and thick and become painful to walk and wear shoes" Patient has been diagnosed with DM with neuropathy.   The patient presents for preventative foot care services..  Patient says he has developed a skin fissure on back of right heel.  Podiatric Exam: Vascular: dorsalis pedis and posterior tibial pulses are palpable bilateral. Capillary return is immediate. Temperature gradient is WNL. Skin turgor WNL  Sensorium: Absent  Semmes Weinstein monofilament test. Absent tactile sensation bilaterally. Nail Exam: Pt has thick disfigured discolored nails with subungual debris noted bilateral entire nail hallux through fifth toenails Ulcer Exam: There is no evidence of ulcer or pre-ulcerative changes or infection. Orthopedic Exam: Muscle tone and strength are WNL. No limitations in general ROM. No crepitus or effusions noted. Foot type and digits show no abnormalities. Bony prominences are unremarkable. Skin: No Porokeratosis. No infection or ulcers  Diagnosis:  Onychomycosis, , Pain in right toe, pain in left toes  Treatment & Plan Procedures and Treatment: Consent by patient was obtained for treatment procedures.   Debridement of mycotic and hypertrophic toenails, 1 through 5 bilateral and clearing of subungual debris. No ulceration, no infection noted. Told him to use chapstick on the fissure.  Told him to Vaseline the bottom of both feet. Return Visit-Office Procedure: Patient instructed to return to the office for a follow up visit 10 weeks  for continued evaluation and treatment.    Gardiner Barefoot DPM

## 2019-08-14 ENCOUNTER — Telehealth: Payer: Self-pay | Admitting: Family Medicine

## 2019-08-14 NOTE — Telephone Encounter (Signed)
Pt states that his diabetic meter has died and he needs a script for a new one.  Pt states that he doesn't know how much insulin to give himself because the meter has stopped reading. Pt will need all new supplies as well if it is a different brand than what he has now. Pt uses  [facility] [address] [place] [phone] (Phone) [phone] (Fax)

## 2019-08-14 NOTE — Telephone Encounter (Signed)
Order faxed.

## 2019-08-18 ENCOUNTER — Encounter: Payer: Self-pay | Admitting: Nurse Practitioner

## 2019-08-18 ENCOUNTER — Ambulatory Visit (INDEPENDENT_AMBULATORY_CARE_PROVIDER_SITE_OTHER): Payer: Medicare Other | Admitting: Nurse Practitioner

## 2019-08-18 ENCOUNTER — Other Ambulatory Visit: Payer: Self-pay

## 2019-08-18 VITALS — BP 114/75 | HR 65 | Temp 98.1°F | Ht 68.0 in | Wt 230.0 lb

## 2019-08-18 DIAGNOSIS — E785 Hyperlipidemia, unspecified: Secondary | ICD-10-CM

## 2019-08-18 DIAGNOSIS — I10 Essential (primary) hypertension: Secondary | ICD-10-CM

## 2019-08-18 DIAGNOSIS — E1169 Type 2 diabetes mellitus with other specified complication: Secondary | ICD-10-CM | POA: Diagnosis not present

## 2019-08-18 DIAGNOSIS — J449 Chronic obstructive pulmonary disease, unspecified: Secondary | ICD-10-CM | POA: Diagnosis not present

## 2019-08-18 DIAGNOSIS — Z794 Long term (current) use of insulin: Secondary | ICD-10-CM

## 2019-08-18 DIAGNOSIS — Z1211 Encounter for screening for malignant neoplasm of colon: Secondary | ICD-10-CM

## 2019-08-18 DIAGNOSIS — E1165 Type 2 diabetes mellitus with hyperglycemia: Secondary | ICD-10-CM

## 2019-08-18 NOTE — Patient Instructions (Signed)
Carbohydrate Counting for Diabetes Mellitus, Adult  Carbohydrate counting is a method of keeping track of how many carbohydrates you eat. Eating carbohydrates naturally increases the amount of sugar (glucose) in the blood. Counting how many carbohydrates you eat helps keep your blood glucose within normal limits, which helps you manage your diabetes (diabetes mellitus). It is important to know how many carbohydrates you can safely have in each meal. This is different for every person. A diet and nutrition specialist (registered dietitian) can help you make a meal plan and calculate how many carbohydrates you should have at each meal and snack. Carbohydrates are found in the following foods:  Grains, such as breads and cereals.  Dried beans and soy products.  Starchy vegetables, such as potatoes, peas, and corn.  Fruit and fruit juices.  Milk and yogurt.  Sweets and snack foods, such as cake, cookies, candy, chips, and soft drinks. How do I count carbohydrates? There are two ways to count carbohydrates in food. You can use either of the methods or a combination of both. Reading "Nutrition Facts" on packaged food The "Nutrition Facts" list is included on the labels of almost all packaged foods and beverages in the [place] It includes:  The serving size.  Information about nutrients in each serving, including the grams (g) of carbohydrate per serving. To use the "Nutrition Facts":  Decide how many servings you will have.  Multiply the number of servings by the number of carbohydrates per serving.  The resulting number is the total amount of carbohydrates that you will be having. Learning standard serving sizes of other foods When you eat carbohydrate foods that are not packaged or do not include "Nutrition Facts" on the label, you need to measure the servings in order to count the amount of carbohydrates:  Measure the foods that you will eat with a food scale or measuring cup, if needed.   Decide how many standard-size servings you will eat.  Multiply the number of servings by 15. Most carbohydrate-rich foods have about 15 g of carbohydrates per serving. ? For example, if you eat 8 oz (170 g) of strawberries, you will have eaten 2 servings and 30 g of carbohydrates (2 servings x 15 g = 30 g).  For foods that have more than one food mixed, such as soups and casseroles, you must count the carbohydrates in each food that is included. The following list contains standard serving sizes of common carbohydrate-rich foods. Each of these servings has about 15 g of carbohydrates:   hamburger bun or  English muffin.   oz (15 mL) syrup.   oz (14 g) jelly.  1 slice of bread.  1 six-inch tortilla.  3 oz (85 g) cooked rice or pasta.  4 oz (113 g) cooked dried beans.  4 oz (113 g) starchy vegetable, such as peas, corn, or potatoes.  4 oz (113 g) hot cereal.  4 oz (113 g) mashed potatoes or  of a large baked potato.  4 oz (113 g) canned or frozen fruit.  4 oz (120 mL) fruit juice.  4-6 crackers.  6 chicken nuggets.  6 oz (170 g) unsweetened dry cereal.  6 oz (170 g) plain fat-free yogurt or yogurt sweetened with artificial sweeteners.  8 oz (240 mL) milk.  8 oz (170 g) fresh fruit or one small piece of fruit.  24 oz (680 g) popped popcorn. Example of carbohydrate counting Sample meal  3 oz (85 g) chicken breast.  6 oz (170 g)   brown rice.  4 oz (113 g) corn.  8 oz (240 mL) milk.  8 oz (170 g) strawberries with sugar-free whipped topping. Carbohydrate calculation 1. Identify the foods that contain carbohydrates: ? Rice. ? Corn. ? Milk. ? Strawberries. 2. Calculate how many servings you have of each food: ? 2 servings rice. ? 1 serving corn. ? 1 serving milk. ? 1 serving strawberries. 3. Multiply each number of servings by 15 g: ? 2 servings rice x 15 g = 30 g. ? 1 serving corn x 15 g = 15 g. ? 1 serving milk x 15 g = 15 g. ? 1 serving  strawberries x 15 g = 15 g. 4. Add together all of the amounts to find the total grams of carbohydrates eaten: ? 30 g + 15 g + 15 g + 15 g = 75 g of carbohydrates total. Summary  Carbohydrate counting is a method of keeping track of how many carbohydrates you eat.  Eating carbohydrates naturally increases the amount of sugar (glucose) in the blood.  Counting how many carbohydrates you eat helps keep your blood glucose within normal limits, which helps you manage your diabetes.  A diet and nutrition specialist (registered dietitian) can help you make a meal plan and calculate how many carbohydrates you should have at each meal and snack. This information is not intended to replace advice given to you by your health care provider. Make sure you discuss any questions you have with your health care provider. Document Released: 10/05/2005 Document Revised: 04/29/2017 Document Reviewed: 03/18/2016 Elsevier Patient Education  2020 Elsevier Inc.  

## 2019-08-18 NOTE — Assessment & Plan Note (Signed)
Chronic, stable.  Continue current medication regimen and adjust as needed.  Consider spirometry testing next visit.

## 2019-08-18 NOTE — Assessment & Plan Note (Signed)
Chronic, ongoing with poor control.  Followed by endocrinology.  Continue current medication regimen and collaboration with endocrinology.  Recommend continued focus on diet at home and weight loss.  Return in 3 months.

## 2019-08-18 NOTE — Assessment & Plan Note (Signed)
Chronic, ongoing.  Continue current medication regimen and adjust as needed. Lipid panel today. 

## 2019-08-18 NOTE — Progress Notes (Signed)
BP 114/75   Pulse 65   Temp 98.1 F (36.7 C) (Oral)   Ht 5\' 8"  (1.727 m)   Wt 230 lb (104.3 kg)   SpO2 96%   BMI 34.97 kg/m    Subjective:    Patient ID: Edward Schneider., male    DOB: [birth year], 63 y.o.   MRN: [id number]  HPI: Edward Schneider. is a 63 y.o. male  Chief Complaint  Patient presents with  . Diabetes  . Hypertension  . Hyperlipidemia  . COPD   DIABETES Sees endocrinology and last saw 07/26/2019 with A1C 13.7, Dr. Wilmon Pali. Continues insulin Tresiba 30 units and Humalog 14 units before meals.  Was started on Trulicity [id number] MG weekly. Hypoglycemic episodes:no Polydipsia/polyuria: no Visual disturbance: no Chest pain: no Paresthesias: no Glucose Monitoring: yes             Accucheck frequency: QID             Fasting glucose: 80 to 170             Post prandial: 200's             Evening: 124 last night             Before meals: 200's Taking Insulin?: yes             Long acting insulin: Tresiba 30 units             Short acting insulin: Humalog 14 units Blood Pressure Monitoring: daily Retinal Examination: Up to Date Foot Exam: Up to Date Pneumovax: Up to Date Influenza: Up to Date Aspirin: yes   HYPERTENSION / HYPERLIPIDEMIA Satisfied with current treatment? yes Duration of hypertension: chronic BP monitoring frequency: rarely BP range: 100-110/60-70's BP medication side effects: no Duration of hyperlipidemia: chronic Cholesterol medication side effects: no Cholesterol supplements: none Medication compliance: good compliance Aspirin: yes Recent stressors: no Recurrent headaches: no Visual changes: no Palpitations: no Dyspnea: no Chest pain: no Lower extremity edema: no Dizzy/lightheaded: no  The 10-year ASCVD risk score [name], et al., 2013) is: 18.1%   Values used to calculate the score:     Age: 43 years     Sex: Male     Is Non-Hispanic African American: No     Diabetic: Yes     Tobacco smoker: No     Systolic Blood  Pressure: 114 mmHg     Is BP treated: Yes     HDL Cholesterol: 46 mg/dL     Total Cholesterol: 191 mg/dL  COPD Continues Anoro and Albuterol. Does use CPAP and uses 100% of the time. COPD status: stable Satisfied with current treatment?: yes Oxygen use: no Dyspnea frequency: occasional with vigorous activity Cough frequency: minimal Rescue inhaler frequency:  Once a day Limitation of activity: no Productive cough:  Last Spirometry:  Pneumovax: Up to Date Influenza: Up to Date  Relevant past medical, surgical, family and social history reviewed and updated as indicated. Interim medical history since our last visit reviewed. Allergies and medications reviewed and updated.  Review of Systems  Constitutional: Negative for activity change, diaphoresis, fatigue and fever.  Respiratory: Negative for cough, chest tightness, shortness of breath and wheezing.   Cardiovascular: Negative for chest pain, palpitations and leg swelling.  Gastrointestinal: Negative for abdominal distention, abdominal pain, constipation, diarrhea, nausea and vomiting.  Endocrine: Negative for cold intolerance, heat intolerance, polydipsia, polyphagia and polyuria.  Neurological: Negative for dizziness, syncope, weakness, light-headedness, numbness and headaches.  Psychiatric/Behavioral: Negative.     Per HPI unless specifically indicated above     Objective:    BP 114/75   Pulse 65   Temp 98.1 F (36.7 C) (Oral)   Ht 5\' 8"  (1.727 m)   Wt 230 lb (104.3 kg)   SpO2 96%   BMI 34.97 kg/m   Wt Readings from Last 3 Encounters:  08/18/19 230 lb (104.3 kg)  07/26/19 234 lb (106.1 kg)  05/02/19 227 lb 8 oz (103.2 kg)    Physical Exam Vitals signs and nursing note reviewed.  Constitutional:      General: He is awake. He is not in acute distress.    Appearance: He is well-developed. He is obese. He is not ill-appearing.  HENT:     Head: Normocephalic and atraumatic.     Right Ear: Hearing normal. No  drainage.     Left Ear: Hearing normal. No drainage.  Eyes:     General: Lids are normal.        Right eye: No discharge.        Left eye: No discharge.     Conjunctiva/sclera: Conjunctivae normal.     Pupils: Pupils are equal, round, and reactive to light.  Neck:     Musculoskeletal: Normal range of motion and neck supple.     Thyroid: No thyromegaly.     Vascular: No carotid bruit.  Cardiovascular:     Rate and Rhythm: Normal rate and regular rhythm.     Heart sounds: Normal heart sounds, S1 normal and S2 normal. No murmur. No gallop.   Pulmonary:     Effort: Pulmonary effort is normal. No accessory muscle usage or respiratory distress.     Breath sounds: Normal breath sounds.  Abdominal:     General: Bowel sounds are normal.     Palpations: Abdomen is soft.  Musculoskeletal: Normal range of motion.     Right lower leg: No edema.     Left lower leg: No edema.  Skin:    General: Skin is warm and dry.     Comments: Multiple scratch marks bilateral arms.  Neurological:     Mental Status: He is alert and oriented to person, place, and time.     Deep Tendon Reflexes: Reflexes are normal and symmetric.  Psychiatric:        Mood and Affect: Mood normal.        Behavior: Behavior normal. Behavior is cooperative.        Thought Content: Thought content normal.        Judgment: Judgment normal.     Results for orders placed or performed in visit on 07/26/19  POCT HgB A1C  Result Value Ref Range   Hemoglobin A1C 13.7 (A) 4.0 - 5.6 %   HbA1c POC (<> result, manual entry)     HbA1c, POC (prediabetic range)     HbA1c, POC (controlled diabetic range)        Assessment & Plan:   Problem List Items Addressed This Visit      Cardiovascular and Mediastinum   Essential hypertension    Chronic, stable with BP below goal.  Continue current medication regimen and adjust as needed.  CMP today.      Relevant Orders   Comprehensive metabolic panel     Respiratory   COPD (chronic  obstructive pulmonary disease) (HCC)    Chronic, stable.  Continue current medication regimen and adjust as needed.  Consider spirometry testing next visit.  Endocrine   Hyperlipidemia associated with type 2 diabetes mellitus (HCC)    Chronic, ongoing.  Continue current medication regimen and adjust as needed.  Lipid panel today.         Relevant Orders   Comprehensive metabolic panel   Lipid Panel w/o Chol/HDL Ratio   Type 2 diabetes mellitus with hyperglycemia, with long-term current use of insulin (HCC) - Primary    Chronic, ongoing with poor control.  Followed by endocrinology.  Continue current medication regimen and collaboration with endocrinology.  Recommend continued focus on diet at home and weight loss.  Return in 3 months.       Other Visit Diagnoses    Colon cancer screening       Relevant Orders   Ambulatory referral to Gastroenterology       Follow up plan: Return in about 3 months (around 11/18/2019) for T2DM ,HTN/HLD, COPD, OSA.

## 2019-08-18 NOTE — Assessment & Plan Note (Signed)
Chronic, stable with BP below goal.  Continue current medication regimen and adjust as needed. CMP today.   

## 2019-08-19 LAB — COMPREHENSIVE METABOLIC PANEL
ALT: 27 IU/L (ref 0–44)
AST: 23 IU/L (ref 0–40)
Albumin/Globulin Ratio: 1.7 (ref 1.2–2.2)
Albumin: 4.6 g/dL (ref 3.8–4.8)
Alkaline Phosphatase: 94 IU/L (ref 39–117)
BUN/Creatinine Ratio: 16 (ref 10–24)
BUN: 15 mg/dL (ref 8–27)
Bilirubin Total: 0.4 mg/dL (ref 0.0–1.2)
CO2: 20 mmol/L (ref 20–29)
Calcium: 9.8 mg/dL (ref 8.6–10.2)
Chloride: 102 mmol/L (ref 96–106)
Creatinine, Ser: 0.92 mg/dL (ref 0.76–1.27)
GFR calc Af Amer: 103 mL/min/{1.73_m2} (ref >59)
GFR calc non Af Amer: 89 mL/min/{1.73_m2} (ref >59)
Globulin, Total: 2.7 g/dL (ref 1.5–4.5)
Glucose: 203 mg/dL — ABNORMAL HIGH (ref 65–99)
Potassium: 4.3 mmol/L (ref 3.5–5.2)
Sodium: 139 mmol/L (ref 134–144)
Total Protein: 7.3 g/dL (ref 6.0–8.5)

## 2019-08-19 LAB — LIPID PANEL W/O CHOL/HDL RATIO
Cholesterol, Total: 196 mg/dL (ref 100–199)
HDL: 48 mg/dL (ref >39)
LDL Chol Calc (NIH): 117 mg/dL — ABNORMAL HIGH (ref 0–99)
Triglycerides: 175 mg/dL — ABNORMAL HIGH (ref 0–149)
VLDL Cholesterol Cal: 31 mg/dL (ref 5–40)

## 2019-08-22 ENCOUNTER — Emergency Department
Admission: EM | Admit: 2019-08-22 | Discharge: 2019-08-22 | Disposition: A | Discharge status: Home / Self Care | Payer: Medicare Other | Attending: Emergency Medicine | Admitting: Emergency Medicine

## 2019-08-22 ENCOUNTER — Telehealth: Payer: Self-pay | Admitting: Gastroenterology

## 2019-08-22 ENCOUNTER — Other Ambulatory Visit: Payer: Self-pay

## 2019-08-22 ENCOUNTER — Ambulatory Visit: Payer: Self-pay | Admitting: Pharmacist

## 2019-08-22 DIAGNOSIS — R0902 Hypoxemia: Secondary | ICD-10-CM | POA: Diagnosis not present

## 2019-08-22 DIAGNOSIS — Z87891 Personal history of nicotine dependence: Secondary | ICD-10-CM | POA: Diagnosis not present

## 2019-08-22 DIAGNOSIS — R4020 Unspecified coma: Secondary | ICD-10-CM | POA: Diagnosis not present

## 2019-08-22 DIAGNOSIS — I10 Essential (primary) hypertension: Secondary | ICD-10-CM | POA: Insufficient documentation

## 2019-08-22 DIAGNOSIS — E11649 Type 2 diabetes mellitus with hypoglycemia without coma: Secondary | ICD-10-CM | POA: Diagnosis not present

## 2019-08-22 DIAGNOSIS — R079 Chest pain, unspecified: Secondary | ICD-10-CM | POA: Diagnosis not present

## 2019-08-22 DIAGNOSIS — Z743 Need for continuous supervision: Secondary | ICD-10-CM | POA: Diagnosis not present

## 2019-08-22 DIAGNOSIS — Z794 Long term (current) use of insulin: Secondary | ICD-10-CM | POA: Diagnosis not present

## 2019-08-22 DIAGNOSIS — Z79899 Other long term (current) drug therapy: Secondary | ICD-10-CM | POA: Insufficient documentation

## 2019-08-22 DIAGNOSIS — R569 Unspecified convulsions: Secondary | ICD-10-CM | POA: Diagnosis present

## 2019-08-22 DIAGNOSIS — R0789 Other chest pain: Secondary | ICD-10-CM | POA: Diagnosis not present

## 2019-08-22 DIAGNOSIS — E162 Hypoglycemia, unspecified: Secondary | ICD-10-CM

## 2019-08-22 DIAGNOSIS — J449 Chronic obstructive pulmonary disease, unspecified: Secondary | ICD-10-CM | POA: Diagnosis not present

## 2019-08-22 LAB — URINALYSIS, COMPLETE (UACMP) WITH MICROSCOPIC
Bacteria, UA: NONE SEEN
Bilirubin Urine: NEGATIVE
Glucose, UA: NEGATIVE mg/dL
Hgb urine dipstick: NEGATIVE
Ketones, ur: NEGATIVE mg/dL
Leukocytes,Ua: NEGATIVE
Nitrite: NEGATIVE
Protein, ur: NEGATIVE mg/dL
Specific Gravity, Urine: 1.008 (ref 1.005–1.030)
Squamous Epithelial / HPF: NONE SEEN (ref 0–5)
WBC, UA: NONE SEEN WBC/hpf (ref 0–5)
pH: 5 (ref 5.0–8.0)

## 2019-08-22 LAB — LIPASE, BLOOD: Lipase: 28 U/L (ref 11–51)

## 2019-08-22 LAB — CBC WITH DIFFERENTIAL/PLATELET
Abs Immature Granulocytes: 0.05 10*3/uL (ref 0.00–0.07)
Basophils Absolute: 0.1 10*3/uL (ref 0.0–0.1)
Basophils Relative: 1 %
Eosinophils Absolute: 0.4 10*3/uL (ref 0.0–0.5)
Eosinophils Relative: 3 %
HCT: 38.4 % — ABNORMAL LOW (ref 39.0–52.0)
Hemoglobin: 12.3 g/dL — ABNORMAL LOW (ref 13.0–17.0)
Immature Granulocytes: 1 %
Lymphocytes Relative: 31 %
Lymphs Abs: 3.3 10*3/uL (ref 0.7–4.0)
MCH: 27.5 pg (ref 26.0–34.0)
MCHC: 32 g/dL (ref 30.0–36.0)
MCV: 85.7 fL (ref 80.0–100.0)
Monocytes Absolute: 1.2 10*3/uL — ABNORMAL HIGH (ref 0.1–1.0)
Monocytes Relative: 11 %
Neutro Abs: 5.8 10*3/uL (ref 1.7–7.7)
Neutrophils Relative %: 53 %
Platelets: 207 10*3/uL (ref 150–400)
RBC: 4.48 MIL/uL (ref 4.22–5.81)
RDW: 13.2 % (ref 11.5–15.5)
WBC: 10.8 10*3/uL — ABNORMAL HIGH (ref 4.0–10.5)
nRBC: 0 % (ref 0.0–0.2)

## 2019-08-22 LAB — COMPREHENSIVE METABOLIC PANEL
ALT: 32 U/L (ref 0–44)
AST: 36 U/L (ref 15–41)
Albumin: 4.2 g/dL (ref 3.5–5.0)
Alkaline Phosphatase: 73 U/L (ref 38–126)
Anion gap: 12 (ref 5–15)
BUN: 13 mg/dL (ref 8–23)
CO2: 26 mmol/L (ref 22–32)
Calcium: 10.1 mg/dL (ref 8.9–10.3)
Chloride: 98 mmol/L (ref 98–111)
Creatinine, Ser: 1.18 mg/dL (ref 0.61–1.24)
GFR calc Af Amer: >60 mL/min (ref >60)
GFR calc non Af Amer: >60 mL/min (ref >60)
Glucose, Bld: 137 mg/dL — ABNORMAL HIGH (ref 70–99)
Potassium: 4.1 mmol/L (ref 3.5–5.1)
Sodium: 136 mmol/L (ref 135–145)
Total Bilirubin: 0.6 mg/dL (ref 0.3–1.2)
Total Protein: 7.9 g/dL (ref 6.5–8.1)

## 2019-08-22 LAB — LACTIC ACID, PLASMA: Lactic Acid, Venous: 1.3 mmol/L (ref 0.5–1.9)

## 2019-08-22 LAB — GLUCOSE, CAPILLARY: Glucose-Capillary: 99 mg/dL (ref 70–99)

## 2019-08-22 LAB — TROPONIN I (HIGH SENSITIVITY): Troponin I (High Sensitivity): 3 ng/L (ref <18)

## 2019-08-22 MED ORDER — SODIUM CHLORIDE 0.9 % IV BOLUS
1000.0000 mL | Freq: Once | INTRAVENOUS | Status: AC
  Administered 2019-08-22: 21:00:00 1000 mL via INTRAVENOUS

## 2019-08-22 NOTE — Telephone Encounter (Signed)
Pt left vm to schedule a colonoscopy with Dr. Wohl  °

## 2019-08-22 NOTE — ED Provider Notes (Signed)
-----------------------------------------   8:09 PM on 08/22/2019 -----------------------------------------  Blood pressure 114/79, pulse 70, temperature 97.8 F (36.6 C), temperature source Oral, resp. rate (!) 8, height 5\' 8"  (1.727 m), weight 102.1 kg, SpO2 95 %.  Assuming care from Dr. Cherylann Banas.  In short, Edward Schneider. is a 63 y.o. male with a chief complaint of Chest Pain .  Refer to the original H&P for additional details.  The current plan of care is to follow-up labs following episode of hypoglycemia.  Patient's labs are unremarkable and he remains normoglycemic on recheck.  He states he feels much better and has had no further episodes.  Chest pain is now resolved and troponin within normal limits, doubt ACS.  Will discharge patient home with plan to follow-up with his PCP, otherwise return to the ED for new worsening symptoms.  Patient agrees with plan.      Blake Divine, MD 08/22/19 [id number]

## 2019-08-22 NOTE — Telephone Encounter (Signed)
PT left another vm to schedule a colonoscopy

## 2019-08-22 NOTE — ED Triage Notes (Signed)
Pt arrived from home via [facility] with complaint of chest pain and worsening shortness of breath. Chest pain radiates to back and down legs. EMS reports normal VS and unremarkable EKG, as well as that pt is lethargic and has little ability to move legs. 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

## 2019-08-22 NOTE — Telephone Encounter (Signed)
Gastroenterology Pre-Procedure Review  Request Date: Monday 09/12/19 Requesting Physician: Dr. Allen Norris  PATIENT REVIEW QUESTIONS: The patient responded to the following health history questions as indicated:    1. Are you having any GI issues? no 2. Do you have a personal history of Polyps? yes (2017 Dr. Allen Norris) 3. Do you have a family history of Colon Cancer or Polyps? yes (grandfather colon cancer) 4. Diabetes Mellitus? Yes insulin 5. Joint replacements in the past 12 months?no 6. Major health problems in the past 3 months?no 7. Any artificial heart valves, MVP, or defibrillator?no    MEDICATIONS & ALLERGIES:    Patient reports the following regarding taking any anticoagulation/antiplatelet therapy:   Plavix, Coumadin, Eliquis, Xarelto, Lovenox, Pradaxa, Brilinta, or Effient? no Aspirin? no  Patient confirms/reports the following medications:  Current Outpatient Medications  Medication Sig Dispense Refill  . ACCU-CHEK AVIVA PLUS test strip USE 1 STRIP TO CHECK GLUCOSE ONCE DAILY 50 each 0  . Accu-Chek Softclix Lancets lancets USE 1  TO CHECK GLUCOSE ONCE DAILY 100 each 0  . albuterol (VENTOLIN HFA) 108 (90 Base) MCG/ACT inhaler Inhale 2 puffs into the lungs every 6 (six) hours as needed for wheezing or shortness of breath. 16 g 3  . Ascorbic Acid (VITAMIN C) 1000 MG tablet Take 1,000 mg by mouth daily.    Marland Kitchen aspirin EC 81 MG tablet Take 162 mg by mouth daily.     Marland Kitchen atorvastatin (LIPITOR) 20 MG tablet Take 1 tablet (20 mg total) by mouth daily. 90 tablet 4  . benazepril (LOTENSIN) 20 MG tablet Take 1 tablet (20 mg total) by mouth daily. 90 tablet 3  . Cholecalciferol (VITAMIN D3) 125 MCG (5000 UT) CAPS Take by mouth.    Marland Kitchen CINNAMON PO Take 2,400 mg by mouth daily.    . cyclobenzaprine (FLEXERIL) 10 MG tablet Take 1 tablet (10 mg total) by mouth 3 (three) times daily as needed for muscle spasms. 60 tablet 1  . diclofenac sodium (VOLTAREN) 1 % GEL Apply 2 g topically 4 (four) times daily.  100 g 3  . diphenhydrAMINE (BENADRYL) 25 mg capsule Take 25 mg by mouth 2 (two) times daily.     . diphenoxylate-atropine (LOMOTIL) 2.5-0.025 MG tablet Take 1 tablet by mouth 4 (four) times daily as needed for diarrhea or loose stools. 90 tablet 1  . doxepin (SINEQUAN) 25 MG capsule Take 50 mg by mouth at bedtime.    . DULoxetine (CYMBALTA) 30 MG capsule LIMIT 1 2 CAPSULES BY MOUTH PER DAY OF TOLERATED    . DULoxetine (CYMBALTA) 60 MG capsule Take 1 capsule (60 mg total) by mouth daily. 90 capsule 4  . EPINEPHrine (EPIPEN IJ) Inject as directed.    . Ginger, Zingiber officinalis, (GINGER PO) Take 1 Dose by mouth daily.    . Ginseng 100 MG CAPS Take by mouth.    . insulin aspart (NOVOLOG) 100 UNIT/ML FlexPen Inject 14 Units into the skin 3 (three) times daily with meals. 15 mL 11  . insulin degludec (TRESIBA FLEXTOUCH) 100 UNIT/ML SOPN FlexTouch Pen Inject 0.24 mLs (24 Units total) into the skin daily. 3 pen 3  . Insulin Pen Needle (B-D UF III MINI PEN NEEDLES) 31G X 5 MM MISC Daily 50 each 0  . mupirocin ointment (BACTROBAN) 2 % APPLY OINTMENT TOPICALLY TWICE DAILY    . NARCAN 4 MG/0.1ML LIQD nasal spray kit CALL 911. ADMINISTER A SINGLE SPRAY OF NARCAN IN ONE NOSTRIL. REPEAT EVERY 3 MINUTES AS NEEDED IF NO OR  MINIMAL RESPONSE.    Marland Kitchen Omega-3 1000 MG CAPS Take by mouth.    . oxyCODONE (ROXICODONE) 15 MG immediate release tablet TAKE 1 2 TO 1 (ONE HALF TO ONE) TABLET BY MOUTH FOUR TO SIX TIMES DAILY IF TOLERTED NOTE TABLET IS 15 MG  0  . pantoprazole (PROTONIX) 40 MG tablet Take 1 tablet by mouth twice daily 180 tablet 1  . pregabalin (LYRICA) 50 MG capsule TAKE 1 CAPSULE BY MOUTH THREE TIMES DAILY 90 capsule 5  . sucralfate (CARAFATE) 1 g tablet TAKE 1 TABLET BY MOUTH 4 TIMES DAILY... WITH MEALS AND AT BEDTIME 360 tablet 1  . traZODone (DESYREL) 100 MG tablet Take 1 tablet (100 mg total) by mouth at bedtime as needed for sleep. 90 tablet 4  . triamcinolone cream (KENALOG) 0.1 % Apply 1 application  topically 2 (two) times daily. 80 g 2  . TRULICITY 1.63 WG/6.6ZL SOPN     . umeclidinium-vilanterol (ANORO ELLIPTA) 62.5-25 MCG/INH AEPB Inhale 1 puff into the lungs daily. 1 each 12  . vitamin A 10000 UNIT capsule Take 10,000 Units by mouth daily.    . vitamin B-12 (CYANOCOBALAMIN) 1000 MCG tablet Take 1,000 mcg by mouth daily.    . vitamin E 1000 UNIT capsule Take 1,000 Units by mouth daily.     No current facility-administered medications for this visit.     Patient confirms/reports the following allergies:  Allergies  Allergen Reactions  . Bee Pollen Anaphylaxis    Died 3 times when stung by bees. Carries Epi-pen at all times.   . Bee Venom Anaphylaxis  . Crestor [Rosuvastatin Calcium] Shortness Of Breath and Swelling  . Fentanyl Itching and Hives    blisters Patch  . Gabapentin Diarrhea    Severe diarrhea which caused incontinence, loss of appetite and weight loss.  . Shellfish Allergy Anaphylaxis and Swelling    Shrimp causes throat to swell and tingling in tongue. Can eat other white fish, crabcakes, and oysters.  . Buprenorphine Hcl Itching  . Morphine Itching  . Simvastatin Diarrhea    No orders of the defined types were placed in this encounter.   AUTHORIZATION INFORMATION Primary Insurance: 1D#: Group #:  Secondary Insurance: 1D#: Group #:  SCHEDULE INFORMATION: Date: Monday 09/12/19 Time: Location:[facility]

## 2019-08-22 NOTE — ED Notes (Signed)
Pt reports a seizure while EMS was en route

## 2019-08-22 NOTE — ED Provider Notes (Signed)
[facility] Emergency Department Provider Note ____________________________________________   First MD Initiated Contact with Patient 08/22/19 1854     (approximate)  I have reviewed the triage vital signs and the nursing notes.   HISTORY  Chief Complaint Chest Pain    HPI Edward Jake. is a 63 y.o. male with PMH as noted below including a history of diabetes on insulin, seizure disorder, and asthma who presents with a "seizure" that occurred when he believes that his blood sugar dropped around an hour ago.  The patient states that he took his short acting insulin at 6 PM.  He states he has not been eating much over the last few days because he has felt somewhat sick to his stomach with nausea, one episode of vomiting earlier today, and some diarrhea.  However, he states he has had no acute abdominal pain.  He denies fever or urinary symptoms.  He states that he has been compliant with his insulin.  The chief complaint was reported as chest pain, however he states he only had chest pain while being transported because of his wife hitting his chest to wake him up.  He denies any chest pain currently.   Past Medical History:  Diagnosis Date  . Allergy   . Asthma   . C. difficile diarrhea   . Cancer [facility]) June 2016   liver cancer  . Chronic pain   . DDD (degenerative disc disease), cervical   . DDD (degenerative disc disease), lumbar   . Diabetes mellitus without complication ([place])   . GERD (gastroesophageal reflux disease)   . Headache    migraines - none since 02/17  . Hypertension   . Low blood sugar   . Myocardial infarction ([place])   . Seizures ([place])    several as child when sick.  None since age 49  . Stroke [facility])    'mini-stroke" 30 yrs ago. no deficits.  . Wears dentures    full upper and lower    Patient Active Problem List   Diagnosis Date Noted  . Pain due to onychomycosis of toenails of both feet 08/10/2019  . Laceration of foot  05/25/2019  . Abrasion 05/17/2019  . Morbid obesity ([place]) 05/17/2019  . Type 2 diabetes mellitus with hyperglycemia, with long-term current use of insulin ([place]) 01/24/2019  . Syncope 01/14/2019  . BPH (benign prostatic hyperplasia) 04/27/2018  . Sleep apnea 02/08/2018  . GERD (gastroesophageal reflux disease) 04/04/2017  . Advanced care planning/counseling discussion 03/29/2017  . DM type 2 with diabetic peripheral neuropathy ([place]) 09/30/2016  . Skin lesions, generalized 09/24/2016  . Insomnia 12/24/2015  . Hyperlipidemia associated with type 2 diabetes mellitus ([place]) 09/23/2015  . Carotid artery narrowing 09/11/2015  . Atherosclerosis of abdominal aorta ([place]) 08/26/2015  . Migraine headache 08/21/2015  . COPD (chronic obstructive pulmonary disease) ([place]) 04/25/2015  . Essential hypertension 04/25/2015  . DDD (degenerative disc disease), cervical 03/27/2015  . DDD (degenerative disc disease), lumbar 03/27/2015  . Cervical post-laminectomy syndrome 03/27/2015  . Bilateral occipital neuralgia 03/27/2015  . Pancreatic insufficiency 01/28/2015  . Back pain, chronic 12/24/2014  . 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 APPENDECTOMY    . BACK SURGERY    . CARDIAC CATHETERIZATION     stent placed in his "30's"  . COLONOSCOPY WITH PROPOFOL N/A 03/06/2016   Procedure: COLONOSCOPY WITH PROPOFOL;  Surgeon: Lucilla Lame, MD;  Location: [place];  Service:  Endoscopy;  Laterality: N/A;  requests early  . ESOPHAGOGASTRODUODENOSCOPY (EGD) WITH PROPOFOL N/A 09/20/2017   Procedure: ESOPHAGOGASTRODUODENOSCOPY (EGD) WITH PROPOFOL;  Surgeon: Lucilla Lame, MD;  Location: [place];  Service: Endoscopy;  Laterality: N/A;  Diabetic - oral meds  . FINGER SURGERY Left   . KNEE SURGERY Right   . NECK SURGERY    . spleen surg    . spleen surgery    . TOE SURGERY Right     Prior to Admission medications   Medication Sig  Start Date End Date Taking? Authorizing Provider  ACCU-CHEK AVIVA PLUS test strip USE 1 STRIP TO CHECK GLUCOSE ONCE DAILY 07/31/19   Guadalupe Maple, MD  Accu-Chek Softclix Lancets lancets USE 1  TO CHECK GLUCOSE ONCE DAILY 07/31/19   Guadalupe Maple, MD  albuterol (VENTOLIN HFA) 108 (90 Base) MCG/ACT inhaler Inhale 2 puffs into the lungs every 6 (six) hours as needed for wheezing or shortness of breath. 05/31/19   Cannady, Henrine Screws T, NP  Ascorbic Acid (VITAMIN C) 1000 MG tablet Take 1,000 mg by mouth daily.    [provider]  aspirin EC 81 MG tablet Take 162 mg by mouth daily.     [provider]  atorvastatin (LIPITOR) 20 MG tablet Take 1 tablet (20 mg total) by mouth daily. 06/05/19   Guadalupe Maple, MD  benazepril (LOTENSIN) 20 MG tablet Take 1 tablet (20 mg total) by mouth daily. 07/24/19   Guadalupe Maple, MD  Cholecalciferol (VITAMIN D3) 125 MCG (5000 UT) CAPS Take by mouth.    [provider]  CINNAMON PO Take 2,400 mg by mouth daily.    [provider]  cyclobenzaprine (FLEXERIL) 10 MG tablet Take 1 tablet (10 mg total) by mouth 3 (three) times daily as needed for muscle spasms. 01/07/18   Lucilla Lame, MD  diclofenac sodium (VOLTAREN) 1 % GEL Apply 2 g topically 4 (four) times daily. 06/23/18   Volney American, PA-C  diphenhydrAMINE (BENADRYL) 25 mg capsule Take 25 mg by mouth 2 (two) times daily.     [provider]  diphenoxylate-atropine (LOMOTIL) 2.5-0.025 MG tablet Take 1 tablet by mouth 4 (four) times daily as needed for diarrhea or loose stools. 09/29/17   Lucilla Lame, MD  doxepin (SINEQUAN) 25 MG capsule Take 50 mg by mouth at bedtime. 07/31/19   [provider]  DULoxetine (CYMBALTA) 30 MG capsule LIMIT 1 2 CAPSULES BY MOUTH PER DAY OF TOLERATED 08/02/19   [provider]  DULoxetine (CYMBALTA) 60 MG capsule Take 1 capsule (60 mg total) by mouth daily. 06/05/19   Guadalupe Maple, MD  EPINEPHrine (EPIPEN IJ)  Inject as directed.    [provider]  Ginger, Zingiber officinalis, (GINGER PO) Take 1 Dose by mouth daily.    [provider]  Ginseng 100 MG CAPS Take by mouth.    [provider]  insulin aspart (NOVOLOG) 100 UNIT/ML FlexPen Inject 14 Units into the skin 3 (three) times daily with meals. 07/26/19   Shamleffer, Melanie Crazier, MD  insulin degludec (TRESIBA FLEXTOUCH) 100 UNIT/ML SOPN FlexTouch Pen Inject 0.24 mLs (24 Units total) into the skin daily. 07/26/19   Shamleffer, Melanie Crazier, MD  Insulin Pen Needle (B-D UF III MINI PEN NEEDLES) 31G X 5 MM MISC Daily 01/24/19   Shamleffer, Melanie Crazier, MD  mupirocin ointment (BACTROBAN) 2 % APPLY OINTMENT TOPICALLY TWICE DAILY 11/23/18   [provider]  NARCAN 4 MG/0.1ML LIQD nasal spray kit  CALL 911. ADMINISTER A SINGLE SPRAY OF NARCAN IN ONE NOSTRIL. REPEAT EVERY 3 MINUTES AS NEEDED IF NO OR MINIMAL RESPONSE. 02/20/19   [provider]  Omega-3 1000 MG CAPS Take by mouth.    [provider]  oxyCODONE (ROXICODONE) 15 MG immediate release tablet TAKE 1 2 TO 1 (ONE HALF TO ONE) TABLET BY MOUTH FOUR TO SIX TIMES DAILY IF TOLERTED NOTE TABLET IS 15 MG 08/24/18   [provider]  pantoprazole (PROTONIX) 40 MG tablet Take 1 tablet by mouth twice daily 05/29/19   Guadalupe Maple, MD  pregabalin (LYRICA) 50 MG capsule TAKE 1 CAPSULE BY MOUTH THREE TIMES DAILY 11/29/18   Guadalupe Maple, MD  sucralfate (CARAFATE) 1 g tablet TAKE 1 TABLET BY MOUTH 4 TIMES DAILY... WITH MEALS AND AT BEDTIME 05/29/19   Guadalupe Maple, MD  traZODone (DESYREL) 100 MG tablet Take 1 tablet (100 mg total) by mouth at bedtime as needed for sleep. 06/05/19   Guadalupe Maple, MD  triamcinolone cream (KENALOG) 0.1 % Apply 1 application topically 2 (two) times daily. 05/30/18   Guadalupe Maple, MD  TRULICITY 6.19 JK/9.3OI SOPN  07/26/19   [provider]  umeclidinium-vilanterol (ANORO ELLIPTA) 62.5-25 MCG/INH AEPB  Inhale 1 puff into the lungs daily. 05/31/19   Cannady, Henrine Screws T, NP  vitamin A 10000 UNIT capsule Take 10,000 Units by mouth daily.    [provider]  vitamin B-12 (CYANOCOBALAMIN) 1000 MCG tablet Take 1,000 mcg by mouth daily.    [provider]  vitamin E 1000 UNIT capsule Take 1,000 Units by mouth daily.    [provider]    Allergies Bee pollen, Bee venom, Crestor [rosuvastatin calcium], Fentanyl, Gabapentin, Shellfish allergy, Buprenorphine hcl, Morphine, and Simvastatin  Family History  Problem Relation Age of Onset  . Arthritis Mother   . Diabetes Mother   . Kidney disease Mother   . Heart disease Mother   . Hypertension Mother   . Arthritis Father   . Hearing loss Father   . Hypertension Father   . Diabetes Sister   . Heart disease Sister   . Diabetes Daughter   . Diabetes Maternal Aunt   . Diabetes Maternal Grandmother     Social History Social History   Tobacco Use  . Smoking status: Former Smoker    Packs/day: 2.00    Years: 50.00    Pack years: 100.00    Types: Cigarettes    Quit date: 07/04/2012    Years since quitting: 7.1  . Smokeless tobacco: Never Used  Substance Use Topics  . Alcohol use: No    Alcohol/week: 0.0 standard drinks  . Drug use: No    Review of Systems  Constitutional: No fever. Eyes: No redness. ENT: No sore throat. Cardiovascular: Positive for resolved chest pain. Respiratory: Denies shortness of breath. Gastrointestinal: Positive for nausea and diarrhea. Genitourinary: Negative for dysuria.  Musculoskeletal: Negative for back pain. Skin: Negative for rash. Neurological: Negative for headache.   ____________________________________________   PHYSICAL EXAM:  VITAL SIGNS: ED Triage Vitals  Enc Vitals Group     BP 08/22/19 1843 129/87     Pulse Rate 08/22/19 1843 77     Resp 08/22/19 1843 20     Temp 08/22/19 1843 97.8 F (36.6 C)     Temp Source 08/22/19 1843 Oral     SpO2 08/22/19 1839  97 %     Weight 08/22/19 1844 225 lb (102.1 kg)  Height 08/22/19 1844 _0  (1.727 m)     Head Circumference --      Peak Flow --      Pain Score 08/22/19 1844 10     Pain Loc --      Pain Edu? --      Excl. in [place]? --     Constitutional: Alert and oriented.  Relatively comfortable appearing and in no acute distress. Eyes: Conjunctivae are normal.  Head: Atraumatic. Nose: No congestion/rhinnorhea. Mouth/Throat: Mucous membranes are dry.   Neck: Normal range of motion.  Cardiovascular: Normal rate, regular rhythm.  Good peripheral circulation. Respiratory: Normal respiratory effort.  No retractions. Gastrointestinal: Soft and nontender. No distention.  Genitourinary: No flank tenderness. Musculoskeletal: No lower extremity edema.  Extremities warm and well perfused.  Neurologic:  Normal speech and language. No gross focal neurologic deficits are appreciated.  Skin:  Skin is warm and dry. No rash noted. Psychiatric: Mood and affect are normal. Speech and behavior are normal.  ____________________________________________   LABS (all labs ordered are listed, but only abnormal results are displayed)  Labs Reviewed  COMPREHENSIVE METABOLIC PANEL - Abnormal; Notable for the following components:      Result Value   Glucose, Bld 137 (*)    All other components within normal limits  CBC WITH DIFFERENTIAL/PLATELET - Abnormal; Notable for the following components:   WBC 10.8 (*)    Hemoglobin 12.3 (*)    HCT 38.4 (*)    Monocytes Absolute 1.2 (*)    All other components within normal limits  LIPASE, BLOOD  LACTIC ACID, PLASMA  LACTIC ACID, PLASMA  URINALYSIS, COMPLETE (UACMP) WITH MICROSCOPIC  TROPONIN I (HIGH SENSITIVITY)   ____________________________________________  EKG  ED ECG REPORT I, Arta Silence, the attending physician, personally viewed and interpreted this ECG.  Date: 08/22/2019 EKG Time: 1850 Rate: 74 Rhythm: normal sinus rhythm QRS Axis: normal  Intervals: normal ST/T Wave abnormalities: normal Narrative Interpretation: no evidence of acute ischemia; no significant change when compared to EKG of 01/13/2019  ____________________________________________  RADIOLOGY    ____________________________________________   PROCEDURES  Procedure(s) performed: No  Procedures  Critical Care performed: No ____________________________________________   INITIAL IMPRESSION / ASSESSMENT AND PLAN / ED COURSE  Pertinent labs & imaging results that were available during my care of the patient were reviewed by me and considered in my medical decision making (see chart for details).  63 year old male with history of diabetes and other PMH as noted above presents with an episode of loss of consciousness and possible seizure after he believes that he became hypoglycemic.  The patient states he has had decreased p.o. intake in the last few days due to some nausea, occasional vomiting, and diarrhea.  He took his usual short acting insulin around 6 PM and then lost consciousness.  He had chest pain while being transported, but believes this was in his chest wall and states it has resolved.  On exam, the patient is overall relatively comfortable appearing.  His vital signs are normal.  He is alert and oriented, and neurologic exam is nonfocal.  There is no trauma.  He does appear somewhat dehydrated with very dry mucous membranes.  Overall presentation is most consistent with a hypoglycemic episode likely due to taking his insulin without adequate p.o. intake.  His GI symptoms are most consistent with gastroenteritis or possible gastroparesis.  He does appear somewhat dehydrated.  The chest pain is likely musculoskeletal and related to his wife hitting his chest to wake him  up.  There is no evidence of cardiac etiology and his EKG is nonischemic.  We will obtain basic cardiac labs, give IV fluids, and observe the patient for a few hours.  He states that  he feels very well right now.  If he continues to be asymptomatic and the lab work-up is reassuring, anticipate discharge home.  ----------------------------------------- 8:18 PM on 08/22/2019 -----------------------------------------  Signing the patient out to the oncoming physician Dr. Charna Archer.  ____________________________________________   FINAL CLINICAL IMPRESSION(S) / ED DIAGNOSES  Final diagnoses:  Hypoglycemia      NEW MEDICATIONS STARTED DURING THIS VISIT:  New Prescriptions   No medications on file     Note:  This document was prepared using Dragon voice recognition software and may include unintentional dictation errors.   Arta Silence, MD 08/22/19 2018

## 2019-08-22 NOTE — Chronic Care Management (AMB) (Signed)
  Chronic Care Management   Note  08/22/2019 Name: Edward Schneider. MRN: [id number] DOB: 1955-11-21  [name] is a 63 y.o. year old male who is a primary care patient of Crissman, Jeannette How, MD. The CCM team was consulted for assistance with chronic disease management and care coordination needs.    Received message from patient today; he noted he had questions about his inhaler therapy. Called patient back, left message for him to return my call at his convenience.   Follow up plan: - Will follow up with patient later this week.  Catie Darnelle Maffucci, PharmD Clinical Pharmacist [place] [phone]

## 2019-08-24 ENCOUNTER — Other Ambulatory Visit: Payer: Self-pay | Admitting: Nurse Practitioner

## 2019-08-24 MED ORDER — ATORVASTATIN CALCIUM 40 MG PO TABS: 40.0000 mg | ORAL_TABLET | Freq: Every day | ORAL | 3 refills | Status: DC

## 2019-08-25 ENCOUNTER — Ambulatory Visit: Payer: Self-pay | Admitting: Pharmacist

## 2019-08-25 NOTE — Chronic Care Management (AMB) (Signed)
  Chronic Care Management   Note  08/25/2019 Name: Edward Schneider. MRN: [id number] DOB: [birth year]  [name] is a 63 y.o. year old male who is a primary care patient of Crissman, Jeannette How, MD. The CCM team was consulted for assistance with chronic disease management and care coordination needs.    Attempted to contact patient in response to the voicemail I had received from him earlier this week. In the meantime, he had ED visit for hypoglycemia.   Left voicemail for patient to call me back at his convenience.   Follow up plan: - Will outreach patient over the next 4-5 weeks for continued medication management  Catie Darnelle Maffucci, PharmD Clinical Pharmacist [place] [phone]

## 2019-08-28 ENCOUNTER — Telehealth: Payer: Self-pay | Admitting: Nurse Practitioner

## 2019-08-28 ENCOUNTER — Emergency Department: Payer: Medicare Other

## 2019-08-28 ENCOUNTER — Other Ambulatory Visit: Payer: Self-pay | Admitting: Nurse Practitioner

## 2019-08-28 ENCOUNTER — Ambulatory Visit: Payer: Self-pay | Admitting: *Deleted

## 2019-08-28 ENCOUNTER — Other Ambulatory Visit: Payer: Self-pay

## 2019-08-28 ENCOUNTER — Encounter: Payer: Self-pay | Admitting: Emergency Medicine

## 2019-08-28 ENCOUNTER — Emergency Department
Admission: EM | Admit: 2019-08-28 | Discharge: 2019-08-28 | Disposition: A | Discharge status: Home / Self Care | Payer: Medicare Other | Attending: Emergency Medicine | Admitting: Emergency Medicine

## 2019-08-28 DIAGNOSIS — I10 Essential (primary) hypertension: Secondary | ICD-10-CM | POA: Diagnosis not present

## 2019-08-28 DIAGNOSIS — R0789 Other chest pain: Secondary | ICD-10-CM

## 2019-08-28 DIAGNOSIS — Z8505 Personal history of malignant neoplasm of liver: Secondary | ICD-10-CM | POA: Diagnosis not present

## 2019-08-28 DIAGNOSIS — Z87891 Personal history of nicotine dependence: Secondary | ICD-10-CM | POA: Diagnosis not present

## 2019-08-28 DIAGNOSIS — Z794 Long term (current) use of insulin: Secondary | ICD-10-CM | POA: Diagnosis not present

## 2019-08-28 DIAGNOSIS — J45909 Unspecified asthma, uncomplicated: Secondary | ICD-10-CM | POA: Insufficient documentation

## 2019-08-28 DIAGNOSIS — R1012 Left upper quadrant pain: Secondary | ICD-10-CM | POA: Insufficient documentation

## 2019-08-28 DIAGNOSIS — R079 Chest pain, unspecified: Secondary | ICD-10-CM | POA: Diagnosis not present

## 2019-08-28 DIAGNOSIS — Z79899 Other long term (current) drug therapy: Secondary | ICD-10-CM | POA: Insufficient documentation

## 2019-08-28 DIAGNOSIS — E119 Type 2 diabetes mellitus without complications: Secondary | ICD-10-CM | POA: Diagnosis not present

## 2019-08-28 DIAGNOSIS — J9811 Atelectasis: Secondary | ICD-10-CM | POA: Diagnosis not present

## 2019-08-28 DIAGNOSIS — I774 Celiac artery compression syndrome: Secondary | ICD-10-CM | POA: Diagnosis not present

## 2019-08-28 LAB — CBC
HCT: 35.8 % — ABNORMAL LOW (ref 39.0–52.0)
Hemoglobin: 11.9 g/dL — ABNORMAL LOW (ref 13.0–17.0)
MCH: 27.5 pg (ref 26.0–34.0)
MCHC: 33.2 g/dL (ref 30.0–36.0)
MCV: 82.9 fL (ref 80.0–100.0)
Platelets: 190 10*3/uL (ref 150–400)
RBC: 4.32 MIL/uL (ref 4.22–5.81)
RDW: 13.2 % (ref 11.5–15.5)
WBC: 8.9 10*3/uL (ref 4.0–10.5)
nRBC: 0 % (ref 0.0–0.2)

## 2019-08-28 LAB — BASIC METABOLIC PANEL
Anion gap: 9 (ref 5–15)
BUN: 16 mg/dL (ref 8–23)
CO2: 24 mmol/L (ref 22–32)
Calcium: 8.9 mg/dL (ref 8.9–10.3)
Chloride: 101 mmol/L (ref 98–111)
Creatinine, Ser: 1.02 mg/dL (ref 0.61–1.24)
GFR calc Af Amer: >60 mL/min (ref >60)
GFR calc non Af Amer: >60 mL/min (ref >60)
Glucose, Bld: 251 mg/dL — ABNORMAL HIGH (ref 70–99)
Potassium: 3.7 mmol/L (ref 3.5–5.1)
Sodium: 134 mmol/L — ABNORMAL LOW (ref 135–145)

## 2019-08-28 LAB — TROPONIN I (HIGH SENSITIVITY): Troponin I (High Sensitivity): 4 ng/L (ref <18)

## 2019-08-28 MED ORDER — ONDANSETRON HCL 4 MG/2ML IJ SOLN
4.0000 mg | Freq: Once | INTRAMUSCULAR | Status: AC
  Administered 2019-08-28: 4 mg via INTRAVENOUS
  Filled 2019-08-28: qty 2

## 2019-08-28 MED ORDER — KETOROLAC TROMETHAMINE 30 MG/ML IJ SOLN
30.0000 mg | Freq: Once | INTRAMUSCULAR | Status: DC
  Filled 2019-08-28: qty 1

## 2019-08-28 MED ORDER — SODIUM CHLORIDE 0.9 % IV SOLN
1000.0000 mL | Freq: Once | INTRAVENOUS | Status: AC
  Administered 2019-08-28: 1000 mL via INTRAVENOUS

## 2019-08-28 MED ORDER — MORPHINE SULFATE (PF) 4 MG/ML IV SOLN
4.0000 mg | Freq: Once | INTRAVENOUS | Status: AC
  Administered 2019-08-28: 4 mg via INTRAVENOUS
  Filled 2019-08-28: qty 1

## 2019-08-28 MED ORDER — NITROGLYCERIN 0.4 MG SL SUBL
0.4000 mg | SUBLINGUAL_TABLET | Freq: Once | SUBLINGUAL | Status: AC
  Administered 2019-08-28: 13:00:00 0.4 mg via SUBLINGUAL
  Filled 2019-08-28: qty 1

## 2019-08-28 MED ORDER — IOHEXOL 350 MG/ML SOLN
100.0000 mL | Freq: Once | INTRAVENOUS | Status: AC | PRN
  Administered 2019-08-28: 100 mL via INTRAVENOUS
  Filled 2019-08-28: qty 100

## 2019-08-28 NOTE — Telephone Encounter (Signed)
Looks like patient is already scheduled for a hospital follow up

## 2019-08-28 NOTE — Progress Notes (Signed)
Cardiology referral, has hospital follow-up on 13th.

## 2019-08-28 NOTE — Telephone Encounter (Signed)
Agree with this plan due to current symptoms.  Will follow-up after hospitalization.

## 2019-08-28 NOTE — ED Triage Notes (Signed)
Pt reports CP to left side of chest that is shooting in nature. Pt reports pain has never left since being discharged from hospital last week. Pt also reports EMS had to perform CPR on him.

## 2019-08-28 NOTE — Telephone Encounter (Signed)
Noted, spoke to him on phone this evening.  Placed cardiology referral and will see him for follow-up upcoming.

## 2019-08-28 NOTE — ED Provider Notes (Signed)
[facility] Emergency Department Provider Note   ____________________________________________    I have reviewed the triage vital signs and the nursing notes.   HISTORY  Chief Complaint Chest Pain and Shortness of Breath     HPI Edward Schneider. is a 63 y.o. male who presents with complaints of chest pain and abdominal pain.  Patient describes left upper quadrant abdominal pain that occasionally seems to radiate into his chest.  He reports this started yesterday and became more consistent last night.  He denies shortness of breath to me.  No fevers or chills.  Takes aspirin daily, reports he had a angioplasty in 2012, has been doing well since then but has not follow-up with cardiology.  Currently pain is improved  Past Medical History:  Diagnosis Date  . Allergy   . Asthma   . C. difficile diarrhea   . Cancer [facility]) June 2016   liver cancer  . Chronic pain   . DDD (degenerative disc disease), cervical   . DDD (degenerative disc disease), lumbar   . Diabetes mellitus without complication ([place])   . GERD (gastroesophageal reflux disease)   . Headache    migraines - none since 02/17  . Hypertension   . Low blood sugar   . Myocardial infarction ([place])   . Seizures ([place])    several as child when sick.  None since age 27  . Stroke [facility])    'mini-stroke" 30 yrs ago. no deficits.  . Wears dentures    full upper and lower    Patient Active Problem List   Diagnosis Date Noted  . Pain due to onychomycosis of toenails of both feet 08/10/2019  . Laceration of foot 05/25/2019  . Abrasion 05/17/2019  . Morbid obesity ([place]) 05/17/2019  . Type 2 diabetes mellitus with hyperglycemia, with long-term current use of insulin ([place]) 01/24/2019  . Syncope 01/14/2019  . BPH (benign prostatic hyperplasia) 04/27/2018  . Sleep apnea 02/08/2018  . GERD (gastroesophageal reflux disease) 04/04/2017  . Advanced care planning/counseling discussion 03/29/2017  . DM  type 2 with diabetic peripheral neuropathy ([place]) 09/30/2016  . Skin lesions, generalized 09/24/2016  . Insomnia 12/24/2015  . Hyperlipidemia associated with type 2 diabetes mellitus ([place]) 09/23/2015  . Carotid artery narrowing 09/11/2015  . Atherosclerosis of abdominal aorta ([place]) 08/26/2015  . Migraine headache 08/21/2015  . COPD (chronic obstructive pulmonary disease) ([place]) 04/25/2015  . Essential hypertension 04/25/2015  . DDD (degenerative disc disease), cervical 03/27/2015  . DDD (degenerative disc disease), lumbar 03/27/2015  . Cervical post-laminectomy syndrome 03/27/2015  . Bilateral occipital neuralgia 03/27/2015  . Pancreatic insufficiency 01/28/2015  . Back pain, chronic 12/24/2014  . 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 APPENDECTOMY    . BACK SURGERY    . CARDIAC CATHETERIZATION     stent placed in his "30's"  . COLONOSCOPY WITH PROPOFOL N/A 03/06/2016   Procedure: COLONOSCOPY WITH PROPOFOL;  Surgeon: Lucilla Lame, MD;  Location: [place];  Service: Endoscopy;  Laterality: N/A;  requests early  . ESOPHAGOGASTRODUODENOSCOPY (EGD) WITH PROPOFOL N/A 09/20/2017   Procedure: ESOPHAGOGASTRODUODENOSCOPY (EGD) WITH PROPOFOL;  Surgeon: Lucilla Lame, MD;  Location: [place];  Service: Endoscopy;  Laterality: N/A;  Diabetic - oral meds  . FINGER SURGERY Left   . KNEE SURGERY Right   . NECK SURGERY    . spleen surg    . spleen surgery    . TOE SURGERY  Right     Prior to Admission medications   Medication Sig Start Date End Date Taking? Authorizing Provider  ACCU-CHEK AVIVA PLUS test strip USE 1 STRIP TO CHECK GLUCOSE ONCE DAILY 07/31/19   Guadalupe Maple, MD  Accu-Chek Softclix Lancets lancets USE 1  TO CHECK GLUCOSE ONCE DAILY 07/31/19   Guadalupe Maple, MD  albuterol (VENTOLIN HFA) 108 (90 Base) MCG/ACT inhaler Inhale 2 puffs into the lungs every 6 (six) hours as needed for wheezing or  shortness of breath. 05/31/19   Cannady, Henrine Screws T, NP  Ascorbic Acid (VITAMIN C) 1000 MG tablet Take 1,000 mg by mouth daily.    [provider]  aspirin EC 81 MG tablet Take 162 mg by mouth daily.     [provider]  atorvastatin (LIPITOR) 40 MG tablet Take 1 tablet (40 mg total) by mouth daily. 08/24/19   Cannady, Henrine Screws T, NP  benazepril (LOTENSIN) 20 MG tablet Take 1 tablet (20 mg total) by mouth daily. 07/24/19   Guadalupe Maple, MD  Cholecalciferol (VITAMIN D3) 125 MCG (5000 UT) CAPS Take by mouth.    [provider]  CINNAMON PO Take 2,400 mg by mouth daily.    [provider]  cyclobenzaprine (FLEXERIL) 10 MG tablet Take 1 tablet (10 mg total) by mouth 3 (three) times daily as needed for muscle spasms. 01/07/18   Lucilla Lame, MD  diclofenac sodium (VOLTAREN) 1 % GEL Apply 2 g topically 4 (four) times daily. 06/23/18   Volney American, PA-C  diphenhydrAMINE (BENADRYL) 25 mg capsule Take 25 mg by mouth 2 (two) times daily.     [provider]  diphenoxylate-atropine (LOMOTIL) 2.5-0.025 MG tablet Take 1 tablet by mouth 4 (four) times daily as needed for diarrhea or loose stools. 09/29/17   Lucilla Lame, MD  doxepin (SINEQUAN) 25 MG capsule Take 50 mg by mouth at bedtime. 07/31/19   [provider]  DULoxetine (CYMBALTA) 30 MG capsule LIMIT 1 2 CAPSULES BY MOUTH PER DAY OF TOLERATED 08/02/19   [provider]  DULoxetine (CYMBALTA) 60 MG capsule Take 1 capsule (60 mg total) by mouth daily. 06/05/19   Guadalupe Maple, MD  EPINEPHrine (EPIPEN IJ) Inject as directed.    [provider]  Ginger, Zingiber officinalis, (GINGER PO) Take 1 Dose by mouth daily.    [provider]  Ginseng 100 MG CAPS Take by mouth.    [provider]  insulin aspart (NOVOLOG) 100 UNIT/ML FlexPen Inject 14 Units into the skin 3 (three) times daily with meals. 07/26/19   Shamleffer, Melanie Crazier, MD  insulin degludec (TRESIBA  FLEXTOUCH) 100 UNIT/ML SOPN FlexTouch Pen Inject 0.24 mLs (24 Units total) into the skin daily. 07/26/19   Shamleffer, Melanie Crazier, MD  Insulin Pen Needle (B-D UF III MINI PEN NEEDLES) 31G X 5 MM MISC Daily 01/24/19   Shamleffer, Melanie Crazier, MD  mupirocin ointment (BACTROBAN) 2 % APPLY OINTMENT TOPICALLY TWICE DAILY 11/23/18   [provider]  NARCAN 4 MG/0.1ML LIQD nasal spray kit CALL 911. ADMINISTER A SINGLE SPRAY OF NARCAN IN ONE NOSTRIL. REPEAT EVERY 3 MINUTES AS NEEDED IF NO OR MINIMAL RESPONSE. 02/20/19   [provider]  Omega-3 1000 MG CAPS Take by mouth.    [provider]  oxyCODONE (ROXICODONE) 15 MG immediate release tablet TAKE 1 2 TO 1 (ONE HALF TO ONE) TABLET BY MOUTH FOUR TO SIX TIMES DAILY IF TOLERTED NOTE TABLET IS 15 MG 08/24/18  [provider]  pantoprazole (PROTONIX) 40 MG tablet Take 1 tablet by mouth twice daily 05/29/19   Guadalupe Maple, MD  pregabalin (LYRICA) 50 MG capsule TAKE 1 CAPSULE BY MOUTH THREE TIMES DAILY 11/29/18   Guadalupe Maple, MD  sucralfate (CARAFATE) 1 g tablet TAKE 1 TABLET BY MOUTH 4 TIMES DAILY... WITH MEALS AND AT BEDTIME 05/29/19   Guadalupe Maple, MD  traZODone (DESYREL) 100 MG tablet Take 1 tablet (100 mg total) by mouth at bedtime as needed for sleep. 06/05/19   Guadalupe Maple, MD  triamcinolone cream (KENALOG) 0.1 % Apply 1 application topically 2 (two) times daily. 05/30/18   Guadalupe Maple, MD  TRULICITY 2.95 AO/1.3YQ SOPN  07/26/19   [provider]  umeclidinium-vilanterol (ANORO ELLIPTA) 62.5-25 MCG/INH AEPB Inhale 1 puff into the lungs daily. 05/31/19   Cannady, Henrine Screws T, NP  vitamin A 10000 UNIT capsule Take 10,000 Units by mouth daily.    [provider]  vitamin B-12 (CYANOCOBALAMIN) 1000 MCG tablet Take 1,000 mcg by mouth daily.    [provider]  vitamin E 1000 UNIT capsule Take 1,000 Units by mouth daily.    [provider]     Allergies Bee pollen, Bee  venom, Crestor [rosuvastatin calcium], Fentanyl, Gabapentin, Shellfish allergy, Buprenorphine hcl, Morphine, and Simvastatin  Family History  Problem Relation Age of Onset  . Arthritis Mother   . Diabetes Mother   . Kidney disease Mother   . Heart disease Mother   . Hypertension Mother   . Arthritis Father   . Hearing loss Father   . Hypertension Father   . Diabetes Sister   . Heart disease Sister   . Diabetes Daughter   . Diabetes Maternal Aunt   . Diabetes Maternal Grandmother     Social History Social History   Tobacco Use  . Smoking status: Former Smoker    Packs/day: 2.00    Years: 50.00    Pack years: 100.00    Types: Cigarettes    Quit date: 07/04/2012    Years since quitting: 7.1  . Smokeless tobacco: Never Used  Substance Use Topics  . Alcohol use: No    Alcohol/week: 0.0 standard drinks  . Drug use: No    Review of Systems  Constitutional: No fever/chills Eyes: No visual changes.  ENT: No sore throat. Cardiovascular: As above Respiratory: Denies shortness of breath. Gastrointestinal: As above Genitourinary: Negative for dysuria. Musculoskeletal: Negative for back pain. Skin: Negative for rash. Neurological: Negative for headaches or weakness   ____________________________________________   PHYSICAL EXAM:  VITAL SIGNS: ED Triage Vitals  Enc Vitals Group     BP 08/28/19 0859 124/73     Pulse Rate 08/28/19 0859 77     Resp 08/28/19 0859 20     Temp 08/28/19 0859 98 F (36.7 C)     Temp Source 08/28/19 0859 Oral     SpO2 08/28/19 0859 94 %     Weight 08/28/19 0857 102.1 kg (225 lb)     Height 08/28/19 0857 1.727 m (5' 8" )     Head Circumference --      Peak Flow --      Pain Score 08/28/19 0856 8     Pain Loc --      Pain Edu? --      Excl. in [place]? --     Constitutional: Alert and oriented.   Nose: No congestion/rhinnorhea. Mouth/Throat: Mucous membranes are moist.    Cardiovascular: Normal  rate, regular rhythm. Grossly normal heart  sounds.  Good peripheral circulation. Respiratory: Normal respiratory effort.  No retractions. Lungs CTAB. Gastrointestinal: Mild tenderness left upper quadrant, no distention, no CVA tenderness  Musculoskeletal: .  Warm and well perfused Neurologic:  Normal speech and language. No gross focal neurologic deficits are appreciated.  Skin:  Skin is warm, dry and intact. No rash noted. Psychiatric: Mood and affect are normal. Speech and behavior are normal.  ____________________________________________   LABS (all labs ordered are listed, but only abnormal results are displayed)  Labs Reviewed  BASIC METABOLIC PANEL - Abnormal; Notable for the following components:      Result Value   Sodium 134 (*)    Glucose, Bld 251 (*)    All other components within normal limits  CBC - Abnormal; Notable for the following components:   Hemoglobin 11.9 (*)    HCT 35.8 (*)    All other components within normal limits  TROPONIN I (HIGH SENSITIVITY)  TROPONIN I (HIGH SENSITIVITY)   ____________________________________________  EKG  ED ECG REPORT I, Lavonia Drafts, the attending physician, personally viewed and interpreted this ECG.  Date: 08/28/2019  Rhythm: normal sinus rhythm QRS Axis: normal Intervals: normal ST/T Wave abnormalities: normal Narrative Interpretation: no evidence of acute ischemia  ____________________________________________  RADIOLOGY  Chest x-ray unremarkable CT angiography chest abdomen pelvis unremarkable ____________________________________________   PROCEDURES  Procedure(s) performed: No  Procedures   Critical Care performed: No ____________________________________________   INITIAL IMPRESSION / ASSESSMENT AND PLAN / ED COURSE  Pertinent labs & imaging results that were available during my care of the patient were reviewed by me and considered in my medical decision making (see chart for details).  Patient presents with left upper quadrant  abdominal pain which he states radiates somewhat into his chest.  Intermittent pain which he has had for greater than 24 hours.  EKG is quite reassuring, troponin is unremarkable.  Sent for CT scan given chest pain and abdominal pain which is very reassuring.  Patient treated with IV morphine and IV Zofran with resolution of pain  On reevaluation patient reports he is pain-free, offered admission however the patient says he feels well and prefers not to stay in the hospital if not necessary.  I strongly recommend that he follow-up with cardiology, strict return precautions if pain returns.  He agrees with this plan.    ____________________________________________   FINAL CLINICAL IMPRESSION(S) / ED DIAGNOSES  Final diagnoses:  Atypical chest pain        Note:  This document was prepared using Dragon voice recognition software and may include unintentional dictation errors.   Lavonia Drafts, MD 08/28/19 1423

## 2019-08-28 NOTE — ED Triage Notes (Signed)
Pt reports pain radiates down his left arm and leg.

## 2019-08-28 NOTE — ED Notes (Signed)
See triage note  Presents with pain to left side of chest   Describe pain as sharp  And starting yesterday  States nothing that he does make the pain better of worse

## 2019-08-28 NOTE — Telephone Encounter (Signed)
Copied from [place] [id number]. Topic: Referral - Question >> Aug 28, 2019  3:01 PM [name] wrote: Reason for CRM:  Pt's wife calling.  States that pt was seen at ER today and was told he would need see a heart doctor.  Pt's wife wants to know who the doctors there would recommend.

## 2019-08-28 NOTE — Telephone Encounter (Signed)
Pt called in c/o "It feels like someone is sitting on my chest and it hurts under my left breast".   "I'm also numb and tingling in my left arm and leg".   I instructed him to go to the ED.   His wife is going to take him to [facility] now.     See triage notes.  I forwarded these notes to [facility].      Reason for Disposition . [1] Chest pain lasts > 5 minutes AND [2] history of heart disease (i.e., angina, heart attack, heart failure, bypass surgery, takes nitroglycerin)    History of MI and CVA  Answer Assessment - Initial Assessment Questions 1. LOCATION: "Where does it hurt?"       Hurts under breast on left side of chest.    2. RADIATION: "Does the pain go anywhere else?" (e.g., into neck, jaw, arms, back)     It shoots through left arm and leg.    I'm numb and tingling in my left arm. 3. ONSET: "When did the chest pain begin?" (Minutes, hours or days)      Yesterday   This has been going on since I went to the hospital.   It never went away.    Someone is sitting on my chest. 4. PATTERN "Does the pain come and go, or has it been constant since it started?"  "Does it get worse with exertion?"      It's constant 5. DURATION: "How long does it last" (e.g., seconds, minutes, hours)     Constant 6. SEVERITY: "How bad is the pain?"  (e.g., Scale 1-10; mild, moderate, or severe)    - MILD (1-3): doesn't interfere with normal activities     - MODERATE (4-7): interferes with normal activities or awakens from sleep    - SEVERE (8-10): excruciating pain, unable to do any normal activities       8 7. CARDIAC RISK FACTORS: "Do you have any history of heart problems or risk factors for heart disease?" (e.g., angina, prior heart attack; diabetes, high blood pressure, high cholesterol, smoker, or strong family history of heart disease)     I've had a heart attack and stroke 8. PULMONARY RISK FACTORS: "Do you have any history of lung disease?"  (e.g., blood  clots in lung, asthma, emphysema, birth control pills)     COPD 9. CAUSE: "What do you think is causing the chest pain?"     I don't know   I can't half walk. 10. OTHER SYMPTOMS: "Do you have any other symptoms?" (e.g., dizziness, nausea, vomiting, sweating, fever, difficulty breathing, cough)       Not asked     Instructed to go on to the ED 11. PREGNANCY: "Is there any chance you are pregnant?" "When was your last menstrual period?"       N/A  Protocols used: CHEST PAIN-A-AH

## 2019-08-29 ENCOUNTER — Telehealth: Payer: Self-pay

## 2019-08-29 ENCOUNTER — Ambulatory Visit: Payer: Self-pay | Admitting: *Deleted

## 2019-08-29 NOTE — H&P (View-Only) (Signed)
New Outpatient Visit Date: 08/30/2019  Referring Provider: [name], NP [address] [place],  [place] [address]  Chief Complaint: Chest pain  HPI:  Edward Schneider is a 63 y.o. male who is being seen today for the evaluation of chest pain at the request of Ms. Cannady. He has a history of coronary artery disease with reported PTCA 20-30 years ago (details not available in EMR), hypertension, "mini-stroke," liver cancer, GERD, and asthma.  He presented to the [facility] Emergency Department 2 days ago complaining of chest and abdominal pain.  The pain was primarily localized to the left upper quadrant but occasionally radiated to the chest.  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  He has a history of recurrent syncope and has been seen by multiple [facility] cardiologists over the last 5 years (inpatient and outpatient).  He was admitted with syncope in March, 2020; event was felt to be vasovagal in nature.  Chest pain was atypical; troponin was negative.  Echo showed low-normal LVEF and no other significant abnormality.  Edward Schneider and his wife report at least three "seizures" over the last 1-2 weeks that have been associated with sudden hypoglycemia.  After the first episode last week, Edward Schneider began complaining of left-sided chest pressure.  He describes it as an "elephant sitting on my chest."  The discomfort radiates to the left shoulder, upper back, and left arm.  Sometimes, it also shoots into the left leg and makes him feel as though he needs to have a bowel movement.  He reports associated shortness of breath, though he also notes "always being short of breath."  In hindsight, he reports that he has been having mild chest tightness for at least 20 years.  However, he now has episodic worsening that began with his "seizures" last week.  In the emergency department earlier this week, Edward Schneider has rapid improvement in his pain with  sublingual nitroglycerin and IV morphine; he believes the NTG helped the most. --------------------------------------------------------------------------------------------------  Cardiovascular History & Procedures: Cardiovascular Problems:  Chest pain with remote MI/PTCA (details unavailable)  Risk Factors:  Known coronary artery disease, strokes, hypertension, hyperlipidemia, diabetes, male gender, obesity, prior tobacco use, and age > 20  Cath/PCI:  None available; patient reports PTCA > 20 years ago  CV Surgery:  None  EP Procedures and Devices:  None  Non-Invasive Evaluation(s):  Pharmacologic myocardial perfusion stress test (09/11/2015, [facility]): Low risk study without ischemia or scar.  Recent CV Pertinent Labs: Lab Results  Component Value Date   CHOL 196 08/18/2019   CHOL 152 11/03/2017   HDL 48 08/18/2019   LDLCALC 117 (H) 08/18/2019   TRIG 175 (H) 08/18/2019   TRIG 203 (H) 11/03/2017   CHOLHDL 3.6 04/27/2018   INR 0.93 04/03/2017   K 3.7 08/28/2019   K 4.0 02/28/2014   BUN 16 08/28/2019   BUN 15 08/18/2019   BUN 13 02/28/2014   CREATININE 1.02 08/28/2019   CREATININE 0.85 02/28/2014    --------------------------------------------------------------------------------------------------  Past Medical History:  Diagnosis Date   Allergy    Asthma    C. difficile diarrhea    Cancer [facility]) June 2016   liver cancer   Chronic pain    DDD (degenerative disc disease), cervical    DDD (degenerative disc disease), lumbar    Diabetes mellitus without complication (HCC)    GERD (gastroesophageal reflux disease)    Headache  migraines - none since 02/17   Hyperlipidemia    Hypertension    Low blood sugar    Myocardial infarction ([place])    Seizures ([place])    several as child when sick.  None since age 33   Stroke [facility])    'mini-stroke" 30 yrs ago. no deficits.   Wears dentures    full upper and lower    Past Surgical  History:  Procedure Laterality Date   APPENDECTOMY     BACK SURGERY     CARDIAC CATHETERIZATION     No stent placed in his "73's"   COLONOSCOPY WITH PROPOFOL N/A 03/06/2016   Procedure: COLONOSCOPY WITH PROPOFOL;  Surgeon: Lucilla Lame, MD;  Location: [place];  Service: Endoscopy;  Laterality: N/A;  requests early   ESOPHAGOGASTRODUODENOSCOPY (EGD) WITH PROPOFOL N/A 09/20/2017   Procedure: ESOPHAGOGASTRODUODENOSCOPY (EGD) WITH PROPOFOL;  Surgeon: Lucilla Lame, MD;  Location: [place];  Service: Endoscopy;  Laterality: N/A;  Diabetic - oral meds   FINGER SURGERY Left    KNEE SURGERY Right    NECK SURGERY     spleen surg     spleen surgery     TOE SURGERY Right     Current Meds  Medication Sig   ACCU-CHEK AVIVA PLUS test strip USE 1 STRIP TO CHECK GLUCOSE ONCE DAILY   Accu-Chek Softclix Lancets lancets USE 1  TO CHECK GLUCOSE ONCE DAILY   albuterol (VENTOLIN HFA) 108 (90 Base) MCG/ACT inhaler Inhale 2 puffs into the lungs every 6 (six) hours as needed for wheezing or shortness of breath.   Ascorbic Acid (VITAMIN C) 1000 MG tablet Take 1,000 mg by mouth daily.   aspirin EC 81 MG tablet Take 162 mg by mouth daily.    atorvastatin (LIPITOR) 40 MG tablet Take 1 tablet (40 mg total) by mouth daily.   benazepril (LOTENSIN) 20 MG tablet Take 1 tablet (20 mg total) by mouth daily.   Cholecalciferol (VITAMIN D3) 125 MCG (5000 UT) CAPS Take by mouth.   CINNAMON PO Take 2,400 mg by mouth daily.   cyclobenzaprine (FLEXERIL) 10 MG tablet Take 1 tablet (10 mg total) by mouth 3 (three) times daily as needed for muscle spasms.   diclofenac sodium (VOLTAREN) 1 % GEL Apply 2 g topically 4 (four) times daily.   diphenhydrAMINE (BENADRYL) 25 mg capsule Take 25 mg by mouth 2 (two) times daily.    diphenoxylate-atropine (LOMOTIL) 2.5-0.025 MG tablet Take 1 tablet by mouth 4 (four) times daily as needed for diarrhea or loose stools.   doxepin (SINEQUAN) 25 MG  capsule Take 50 mg by mouth at bedtime.   DULoxetine (CYMBALTA) 30 MG capsule LIMIT 1 2 CAPSULES BY MOUTH PER DAY OF TOLERATED   DULoxetine (CYMBALTA) 60 MG capsule Take 1 capsule (60 mg total) by mouth daily.   EPINEPHrine (EPIPEN IJ) Inject as directed.   Ginger, Zingiber officinalis, (GINGER PO) Take 1 Dose by mouth daily.   Ginseng 100 MG CAPS Take by mouth.   insulin aspart (NOVOLOG) 100 UNIT/ML FlexPen Inject 14 Units into the skin 3 (three) times daily with meals.   insulin degludec (TRESIBA FLEXTOUCH) 100 UNIT/ML SOPN FlexTouch Pen Inject 0.24 mLs (24 Units total) into the skin daily.   Insulin Pen Needle (B-D UF III MINI PEN NEEDLES) 31G X 5 MM MISC Daily   mupirocin ointment (BACTROBAN) 2 % APPLY OINTMENT TOPICALLY TWICE DAILY   NARCAN 4 MG/0.1ML LIQD nasal spray kit CALL 911. ADMINISTER A SINGLE SPRAY OF [facility]  IN ONE NOSTRIL. REPEAT EVERY 3 MINUTES AS NEEDED IF NO OR MINIMAL RESPONSE.   Omega-3 1000 MG CAPS Take by mouth.   oxyCODONE (ROXICODONE) 15 MG immediate release tablet TAKE 1 2 TO 1 (ONE HALF TO ONE) TABLET BY MOUTH FOUR TO SIX TIMES DAILY IF TOLERTED NOTE TABLET IS 15 MG   pantoprazole (PROTONIX) 40 MG tablet Take 1 tablet by mouth twice daily   pregabalin (LYRICA) 50 MG capsule TAKE 1 CAPSULE BY MOUTH THREE TIMES DAILY   sucralfate (CARAFATE) 1 g tablet TAKE 1 TABLET BY MOUTH 4 TIMES DAILY... WITH MEALS AND AT BEDTIME   traZODone (DESYREL) 100 MG tablet Take 1 tablet (100 mg total) by mouth at bedtime as needed for sleep.   triamcinolone cream (KENALOG) 0.1 % Apply 1 application topically 2 (two) times daily.   TRULICITY 6.27 OJ/5.0KX SOPN Every two months   umeclidinium-vilanterol (ANORO ELLIPTA) 62.5-25 MCG/INH AEPB Inhale 1 puff into the lungs daily.   vitamin A 10000 UNIT capsule Take 10,000 Units by mouth daily.   vitamin B-12 (CYANOCOBALAMIN) 1000 MCG tablet Take 1,000 mcg by mouth daily.   vitamin E 1000 UNIT capsule Take 1,000 Units by mouth  daily.    Allergies: Bee pollen, Bee venom, Crestor [rosuvastatin calcium], Fentanyl, Gabapentin, Shellfish allergy, Buprenorphine hcl, Morphine, and Simvastatin  Social History   Tobacco Use   Smoking status: Former Smoker    Packs/day: 2.00    Years: 50.00    Pack years: 100.00    Types: Cigarettes    Quit date: 07/04/2012    Years since quitting: 7.1   Smokeless tobacco: Never Used  Substance Use Topics   Alcohol use: No    Alcohol/week: 0.0 standard drinks   Drug use: No    Family History  Problem Relation Age of Onset   Arthritis Mother    Diabetes Mother    Kidney disease Mother    Heart disease Mother    Hypertension Mother    Arthritis Father    Hearing loss Father    Hypertension Father    Diabetes Sister    Heart disease Sister    Diabetes Daughter    Diabetes Maternal Aunt    Diabetes Maternal Grandmother    Heart Problems Brother    Heart Problems Brother    Heart Problems Brother    Heart Problems Brother     Review of Systems: Patient notes chronic itching on his upper and lower extremities that he attributes to fire ant bites.  He is followed by dermatology.  Otherwise, a 12-system review of systems was performed and was negative except as noted in the HPI.  --------------------------------------------------------------------------------------------------  Physical Exam: BP 108/74 (BP Location: Right Arm, Patient Position: Sitting, Cuff Size: Large)    Pulse 72    Ht _0  (1.727 m)    Wt 237 lb 4 oz (107.6 kg)    SpO2 97%    BMI 36.07 kg/m   General:  NAD HEENT: No conjunctival pallor or scleral icterus. Moist mucous membranes. OP clear. Neck: Supple without lymphadenopathy, thyromegaly, JVD, or HJR. No carotid bruit. Lungs: Normal work of breathing. Clear to auscultation bilaterally without wheezes or crackles. Heart: Regular rate and rhythm without murmurs, rubs, or gallops. Non-displaced PMI. Abd: Bowel sounds present.  Soft, NT/ND without hepatosplenomegaly Ext: No lower extremity edema. Radial, PT, and DP pulses are 2+ bilaterally Skin: Warm and dry.  Multiple areas of excoriation and abrasions on both forearms. Neuro: CNIII-XII intact. Strength and fine-touch sensation  intact in upper and lower extremities bilaterally. Psych: Normal mood and affect.  EKG:  Normal sinus rhythm with 1st degree AV block.  Lab Results  Component Value Date   WBC 8.9 08/28/2019   HGB 11.9 (L) 08/28/2019   HCT 35.8 (L) 08/28/2019   MCV 82.9 08/28/2019   PLT 190 08/28/2019    Lab Results  Component Value Date   NA 134 (L) 08/28/2019   K 3.7 08/28/2019   CL 101 08/28/2019   CO2 24 08/28/2019   BUN 16 08/28/2019   CREATININE 1.02 08/28/2019   GLUCOSE 251 (H) 08/28/2019   ALT 32 08/22/2019    Lab Results  Component Value Date   CHOL 196 08/18/2019   HDL 48 08/18/2019   LDLCALC 117 (H) 08/18/2019   TRIG 175 (H) 08/18/2019   CHOLHDL 3.6 04/27/2018     --------------------------------------------------------------------------------------------------  ASSESSMENT AND PLAN: Coronary artery disease with unstable angina: Edward Schneider reports remote PTCA (details unavailable) and chronic chest tightness over the last 20+ years.  However, he seems to have acute worsening over the last week, coinciding with transient episodes of loss of consciousness that his wife calls "seizure."  These events seem to be related to hypoglycemia in the setting of recent adjustment of his diabetes medications.  Edward Schneider has numerous cardiac risk factors and reports prompt resolution of his chest pain with sublingual nitroglycerin.  EKG today is normal.  Given features concerning for unstable angina, I have recommended that we proceed to cardiac catheterization with possible PCI at his earliest convenience.  I have reviewed the risks, indications, and alternatives to cardiac catheterization, possible angioplasty, and stenting with the patient.  Risks include but are not limited to bleeding, infection, vascular injury, stroke, myocardial infection, arrhythmia, kidney injury, radiation-related injury in the case of prolonged fluoroscopy use, emergency cardiac surgery, and death. The patient understands the risks of serious complication is 1-2 in 9326 with diagnostic cardiac cath and 1-2% or less with angioplasty/stenting.  Given his bilateral forearm abrasions, I favor using femoral access.  I will have Edward Schneider start isosorbide mononitrate 30 mg daily.  I have also provided him with a prescription for sublingual nitroglycerin.  He should continue aspirin 81 mg daily and atorvastatin 40 mg daily.  I advised him to call 911 should he have recurrent chest pain unresponsive to nitroglycerin or experience repeated syncope.  Hypertension: Blood pressure is normal today.  Given addition of isosorbide mononitrate for antianginal therapy, I will decrease benazepril to 10 mg daily to prevent hypotension.  Hyperlipidemia: Continue atorvastatin 40 mg daily for now, though we will need to consider increasing this to 80 mg daily in light of suboptimal LDL control on last check in October.  Type 2 diabetes mellitus with hypoglycemia: Edward Schneider and his wife report multiple hpyoglycemic episodes.  I have advised them to reach out to [facility] endocrinologist as soon as possible, as I worry that Mr. Burling could suffer a significant injury if he has recurrent loss of consciousness from hypoglycemia.  Follow-up: Return to clinic in 3 weeks.  Nelva Bush, MD 08/30/2019 10:57 AM

## 2019-08-29 NOTE — Telephone Encounter (Signed)
Recommend return to ER if worsening chest pain.  There is also referral into cardiology.

## 2019-08-29 NOTE — Progress Notes (Signed)
New Outpatient Visit Date: 08/30/2019  Referring Provider: [name], NP [address] [place],  [place] [address]  Chief Complaint: Chest pain  HPI:  Edward Schneider is a 63 y.o. male who is being seen today for the evaluation of chest pain at the request of Ms. Cannady. He has a history of coronary artery disease with reported PTCA 20-30 years ago (details not available in EMR), hypertension, "mini-stroke," liver cancer, GERD, and asthma.  He presented to the [facility] Emergency Department 2 days ago complaining of chest and abdominal pain.  The pain was primarily localized to the left upper quadrant but occasionally radiated to the chest.  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  He has a history of recurrent syncope and has been seen by multiple [facility] cardiologists over the last 5 years (inpatient and outpatient).  He was admitted with syncope in March, 2020; event was felt to be vasovagal in nature.  Chest pain was atypical; troponin was negative.  Echo showed low-normal LVEF and no other significant abnormality.  Edward Schneider and his wife report at least three "seizures" over the last 1-2 weeks that have been associated with sudden hypoglycemia.  After the first episode last week, Edward Schneider began complaining of left-sided chest pressure.  He describes it as an "elephant sitting on my chest."  The discomfort radiates to the left shoulder, upper back, and left arm.  Sometimes, it also shoots into the left leg and makes him feel as though he needs to have a bowel movement.  He reports associated shortness of breath, though he also notes "always being short of breath."  In hindsight, he reports that he has been having mild chest tightness for at least 20 years.  However, he now has episodic worsening that began with his "seizures" last week.  In the emergency department earlier this week, Edward Schneider has rapid improvement in his pain with  sublingual nitroglycerin and IV morphine; he believes the NTG helped the most. --------------------------------------------------------------------------------------------------  Cardiovascular History & Procedures: Cardiovascular Problems:  Chest pain with remote MI/PTCA (details unavailable)  Risk Factors:  Known coronary artery disease, strokes, hypertension, hyperlipidemia, diabetes, male gender, obesity, prior tobacco use, and age > 20  Cath/PCI:  None available; patient reports PTCA > 20 years ago  CV Surgery:  None  EP Procedures and Devices:  None  Non-Invasive Evaluation(s):  Pharmacologic myocardial perfusion stress test (09/11/2015, [facility]): Low risk study without ischemia or scar.  Recent CV Pertinent Labs: Lab Results  Component Value Date   CHOL 196 08/18/2019   CHOL 152 11/03/2017   HDL 48 08/18/2019   LDLCALC 117 (H) 08/18/2019   TRIG 175 (H) 08/18/2019   TRIG 203 (H) 11/03/2017   CHOLHDL 3.6 04/27/2018   INR 0.93 04/03/2017   K 3.7 08/28/2019   K 4.0 02/28/2014   BUN 16 08/28/2019   BUN 15 08/18/2019   BUN 13 02/28/2014   CREATININE 1.02 08/28/2019   CREATININE 0.85 02/28/2014    --------------------------------------------------------------------------------------------------  Past Medical History:  Diagnosis Date   Allergy    Asthma    C. difficile diarrhea    Cancer [facility]) June 2016   liver cancer   Chronic pain    DDD (degenerative disc disease), cervical    DDD (degenerative disc disease), lumbar    Diabetes mellitus without complication (HCC)    GERD (gastroesophageal reflux disease)    Headache  migraines - none since 02/17   Hyperlipidemia    Hypertension    Low blood sugar    Myocardial infarction ([place])    Seizures ([place])    several as child when sick.  None since age 33   Stroke [facility])    'mini-stroke" 30 yrs ago. no deficits.   Wears dentures    full upper and lower    Past Surgical  History:  Procedure Laterality Date   APPENDECTOMY     BACK SURGERY     CARDIAC CATHETERIZATION     No stent placed in his "73's"   COLONOSCOPY WITH PROPOFOL N/A 03/06/2016   Procedure: COLONOSCOPY WITH PROPOFOL;  Surgeon: Lucilla Lame, MD;  Location: [place];  Service: Endoscopy;  Laterality: N/A;  requests early   ESOPHAGOGASTRODUODENOSCOPY (EGD) WITH PROPOFOL N/A 09/20/2017   Procedure: ESOPHAGOGASTRODUODENOSCOPY (EGD) WITH PROPOFOL;  Surgeon: Lucilla Lame, MD;  Location: [place];  Service: Endoscopy;  Laterality: N/A;  Diabetic - oral meds   FINGER SURGERY Left    KNEE SURGERY Right    NECK SURGERY     spleen surg     spleen surgery     TOE SURGERY Right     Current Meds  Medication Sig   ACCU-CHEK AVIVA PLUS test strip USE 1 STRIP TO CHECK GLUCOSE ONCE DAILY   Accu-Chek Softclix Lancets lancets USE 1  TO CHECK GLUCOSE ONCE DAILY   albuterol (VENTOLIN HFA) 108 (90 Base) MCG/ACT inhaler Inhale 2 puffs into the lungs every 6 (six) hours as needed for wheezing or shortness of breath.   Ascorbic Acid (VITAMIN C) 1000 MG tablet Take 1,000 mg by mouth daily.   aspirin EC 81 MG tablet Take 162 mg by mouth daily.    atorvastatin (LIPITOR) 40 MG tablet Take 1 tablet (40 mg total) by mouth daily.   benazepril (LOTENSIN) 20 MG tablet Take 1 tablet (20 mg total) by mouth daily.   Cholecalciferol (VITAMIN D3) 125 MCG (5000 UT) CAPS Take by mouth.   CINNAMON PO Take 2,400 mg by mouth daily.   cyclobenzaprine (FLEXERIL) 10 MG tablet Take 1 tablet (10 mg total) by mouth 3 (three) times daily as needed for muscle spasms.   diclofenac sodium (VOLTAREN) 1 % GEL Apply 2 g topically 4 (four) times daily.   diphenhydrAMINE (BENADRYL) 25 mg capsule Take 25 mg by mouth 2 (two) times daily.    diphenoxylate-atropine (LOMOTIL) 2.5-0.025 MG tablet Take 1 tablet by mouth 4 (four) times daily as needed for diarrhea or loose stools.   doxepin (SINEQUAN) 25 MG  capsule Take 50 mg by mouth at bedtime.   DULoxetine (CYMBALTA) 30 MG capsule LIMIT 1 2 CAPSULES BY MOUTH PER DAY OF TOLERATED   DULoxetine (CYMBALTA) 60 MG capsule Take 1 capsule (60 mg total) by mouth daily.   EPINEPHrine (EPIPEN IJ) Inject as directed.   Ginger, Zingiber officinalis, (GINGER PO) Take 1 Dose by mouth daily.   Ginseng 100 MG CAPS Take by mouth.   insulin aspart (NOVOLOG) 100 UNIT/ML FlexPen Inject 14 Units into the skin 3 (three) times daily with meals.   insulin degludec (TRESIBA FLEXTOUCH) 100 UNIT/ML SOPN FlexTouch Pen Inject 0.24 mLs (24 Units total) into the skin daily.   Insulin Pen Needle (B-D UF III MINI PEN NEEDLES) 31G X 5 MM MISC Daily   mupirocin ointment (BACTROBAN) 2 % APPLY OINTMENT TOPICALLY TWICE DAILY   NARCAN 4 MG/0.1ML LIQD nasal spray kit CALL 911. ADMINISTER A SINGLE SPRAY OF [facility]  IN ONE NOSTRIL. REPEAT EVERY 3 MINUTES AS NEEDED IF NO OR MINIMAL RESPONSE.   Omega-3 1000 MG CAPS Take by mouth.   oxyCODONE (ROXICODONE) 15 MG immediate release tablet TAKE 1 2 TO 1 (ONE HALF TO ONE) TABLET BY MOUTH FOUR TO SIX TIMES DAILY IF TOLERTED NOTE TABLET IS 15 MG   pantoprazole (PROTONIX) 40 MG tablet Take 1 tablet by mouth twice daily   pregabalin (LYRICA) 50 MG capsule TAKE 1 CAPSULE BY MOUTH THREE TIMES DAILY   sucralfate (CARAFATE) 1 g tablet TAKE 1 TABLET BY MOUTH 4 TIMES DAILY... WITH MEALS AND AT BEDTIME   traZODone (DESYREL) 100 MG tablet Take 1 tablet (100 mg total) by mouth at bedtime as needed for sleep.   triamcinolone cream (KENALOG) 0.1 % Apply 1 application topically 2 (two) times daily.   TRULICITY 6.27 OJ/5.0KX SOPN Every two months   umeclidinium-vilanterol (ANORO ELLIPTA) 62.5-25 MCG/INH AEPB Inhale 1 puff into the lungs daily.   vitamin A 10000 UNIT capsule Take 10,000 Units by mouth daily.   vitamin B-12 (CYANOCOBALAMIN) 1000 MCG tablet Take 1,000 mcg by mouth daily.   vitamin E 1000 UNIT capsule Take 1,000 Units by mouth  daily.    Allergies: Bee pollen, Bee venom, Crestor [rosuvastatin calcium], Fentanyl, Gabapentin, Shellfish allergy, Buprenorphine hcl, Morphine, and Simvastatin  Social History   Tobacco Use   Smoking status: Former Smoker    Packs/day: 2.00    Years: 50.00    Pack years: 100.00    Types: Cigarettes    Quit date: 07/04/2012    Years since quitting: 7.1   Smokeless tobacco: Never Used  Substance Use Topics   Alcohol use: No    Alcohol/week: 0.0 standard drinks   Drug use: No    Family History  Problem Relation Age of Onset   Arthritis Mother    Diabetes Mother    Kidney disease Mother    Heart disease Mother    Hypertension Mother    Arthritis Father    Hearing loss Father    Hypertension Father    Diabetes Sister    Heart disease Sister    Diabetes Daughter    Diabetes Maternal Aunt    Diabetes Maternal Grandmother    Heart Problems Brother    Heart Problems Brother    Heart Problems Brother    Heart Problems Brother     Review of Systems: Patient notes chronic itching on his upper and lower extremities that he attributes to fire ant bites.  He is followed by dermatology.  Otherwise, a 12-system review of systems was performed and was negative except as noted in the HPI.  --------------------------------------------------------------------------------------------------  Physical Exam: BP 108/74 (BP Location: Right Arm, Patient Position: Sitting, Cuff Size: Large)    Pulse 72    Ht _0  (1.727 m)    Wt 237 lb 4 oz (107.6 kg)    SpO2 97%    BMI 36.07 kg/m   General:  NAD HEENT: No conjunctival pallor or scleral icterus. Moist mucous membranes. OP clear. Neck: Supple without lymphadenopathy, thyromegaly, JVD, or HJR. No carotid bruit. Lungs: Normal work of breathing. Clear to auscultation bilaterally without wheezes or crackles. Heart: Regular rate and rhythm without murmurs, rubs, or gallops. Non-displaced PMI. Abd: Bowel sounds present.  Soft, NT/ND without hepatosplenomegaly Ext: No lower extremity edema. Radial, PT, and DP pulses are 2+ bilaterally Skin: Warm and dry.  Multiple areas of excoriation and abrasions on both forearms. Neuro: CNIII-XII intact. Strength and fine-touch sensation  intact in upper and lower extremities bilaterally. Psych: Normal mood and affect.  EKG:  Normal sinus rhythm with 1st degree AV block.  Lab Results  Component Value Date   WBC 8.9 08/28/2019   HGB 11.9 (L) 08/28/2019   HCT 35.8 (L) 08/28/2019   MCV 82.9 08/28/2019   PLT 190 08/28/2019    Lab Results  Component Value Date   NA 134 (L) 08/28/2019   K 3.7 08/28/2019   CL 101 08/28/2019   CO2 24 08/28/2019   BUN 16 08/28/2019   CREATININE 1.02 08/28/2019   GLUCOSE 251 (H) 08/28/2019   ALT 32 08/22/2019    Lab Results  Component Value Date   CHOL 196 08/18/2019   HDL 48 08/18/2019   LDLCALC 117 (H) 08/18/2019   TRIG 175 (H) 08/18/2019   CHOLHDL 3.6 04/27/2018     --------------------------------------------------------------------------------------------------  ASSESSMENT AND PLAN: Coronary artery disease with unstable angina: Edward Schneider reports remote PTCA (details unavailable) and chronic chest tightness over the last 20+ years.  However, he seems to have acute worsening over the last week, coinciding with transient episodes of loss of consciousness that his wife calls "seizure."  These events seem to be related to hypoglycemia in the setting of recent adjustment of his diabetes medications.  Edward Schneider has numerous cardiac risk factors and reports prompt resolution of his chest pain with sublingual nitroglycerin.  EKG today is normal.  Given features concerning for unstable angina, I have recommended that we proceed to cardiac catheterization with possible PCI at his earliest convenience.  I have reviewed the risks, indications, and alternatives to cardiac catheterization, possible angioplasty, and stenting with the patient.  Risks include but are not limited to bleeding, infection, vascular injury, stroke, myocardial infection, arrhythmia, kidney injury, radiation-related injury in the case of prolonged fluoroscopy use, emergency cardiac surgery, and death. The patient understands the risks of serious complication is 1-2 in 9326 with diagnostic cardiac cath and 1-2% or less with angioplasty/stenting.  Given his bilateral forearm abrasions, I favor using femoral access.  I will have Edward Schneider start isosorbide mononitrate 30 mg daily.  I have also provided him with a prescription for sublingual nitroglycerin.  He should continue aspirin 81 mg daily and atorvastatin 40 mg daily.  I advised him to call 911 should he have recurrent chest pain unresponsive to nitroglycerin or experience repeated syncope.  Hypertension: Blood pressure is normal today.  Given addition of isosorbide mononitrate for antianginal therapy, I will decrease benazepril to 10 mg daily to prevent hypotension.  Hyperlipidemia: Continue atorvastatin 40 mg daily for now, though we will need to consider increasing this to 80 mg daily in light of suboptimal LDL control on last check in October.  Type 2 diabetes mellitus with hypoglycemia: Edward Schneider and his wife report multiple hpyoglycemic episodes.  I have advised them to reach out to [facility] endocrinologist as soon as possible, as I worry that Edward Schneider could suffer a significant injury if he has recurrent loss of consciousness from hypoglycemia.  Follow-up: Return to clinic in 3 weeks.  Nelva Bush, MD 08/30/2019 10:57 AM

## 2019-08-29 NOTE — Telephone Encounter (Signed)
Pt called with complaints of intermittent chest pain that started 1 week ago ;his pain is on the left side under his rib; it radiates go "Out through his arm and legs"; he was given NTG and Morphine in ED 08/28/2019 at 1400 but the pain returned around 1800; he rates his pain at 5-6 out of 10, and is constant; the pt says the pain is worse than it was when he was initially seen in the ED; recommendations made per nurse triage protocol; he verbalized understanding; the pt sees Dr Jeananne Rama; will route to office for  notification.  Reason for Disposition . [1] Chest pain (or "angina") comes and goes AND [2] is happening more often (increasing in frequency) or getting worse (increasing in severity)  Answer Assessment - Initial Assessment Questions 1. LOCATION: "Where does it hurt?"       Left side under ribs  2. RADIATION: "Does the pain go anywhere else?" (e.g., into neck, jaw, arms, back)    Left arm and leg 3. ONSET: "When did the chest pain begin?" (Minutes, hours or days)    1 week ago 4. PATTERN "Does the pain come and go, or has it been constant since it started?"  "Does it get worse with exertion?"     intermittent 5. DURATION: "How long does it last" (e.g., seconds, minutes, hours)    Hours 6. SEVERITY: "How bad is the pain?"  (e.g., Scale 1-10; mild, moderate, or severe)    - MILD (1-3): doesn't interfere with normal activities     - MODERATE (4-7): interferes with normal activities or awakens from sleep    - SEVERE (8-10): excruciating pain, unable to do any normal activities      5-6 out of 10 7. CARDIAC RISK FACTORS: "Do you have any history of heart problems or risk factors for heart disease?" (e.g., angina, prior heart attack; diabetes, high blood pressure, high cholesterol, smoker, or strong family history of heart disease)     Yes hx MI and CVA 8. PULMONARY RISK FACTORS: "Do you have any history of lung disease?"  (e.g., blood clots in lung, asthma, emphysema, birth control  pills)      9. CAUSE: "What do you think is causing the chest pain?"     Seen in ED 08/28/2019 10. OTHER SYMPTOMS: "Do you have any other symptoms?" (e.g., dizziness, nausea, vomiting, sweating, fever, difficulty breathing, cough)       11. PREGNANCY: "Is there any chance you are pregnant?" "When was your last menstrual period?"       n/a  Protocols used: CHEST PAIN-A-AH

## 2019-08-29 NOTE — Telephone Encounter (Signed)
On review is scheduled to see Dr. Saunders Revel tomorrow, continue to recommend ER at this time though for worsening chest pain.

## 2019-08-30 ENCOUNTER — Encounter: Payer: Self-pay | Admitting: Internal Medicine

## 2019-08-30 ENCOUNTER — Ambulatory Visit (INDEPENDENT_AMBULATORY_CARE_PROVIDER_SITE_OTHER): Payer: Medicare Other | Admitting: Internal Medicine

## 2019-08-30 ENCOUNTER — Other Ambulatory Visit: Payer: Self-pay

## 2019-08-30 VITALS — BP 108/74 | HR 72 | Ht 68.0 in | Wt 237.2 lb

## 2019-08-30 DIAGNOSIS — E785 Hyperlipidemia, unspecified: Secondary | ICD-10-CM

## 2019-08-30 DIAGNOSIS — Z794 Long term (current) use of insulin: Secondary | ICD-10-CM

## 2019-08-30 DIAGNOSIS — I25110 Atherosclerotic heart disease of native coronary artery with unstable angina pectoris: Secondary | ICD-10-CM | POA: Diagnosis not present

## 2019-08-30 DIAGNOSIS — I10 Essential (primary) hypertension: Secondary | ICD-10-CM | POA: Diagnosis not present

## 2019-08-30 DIAGNOSIS — E11641 Type 2 diabetes mellitus with hypoglycemia with coma: Secondary | ICD-10-CM | POA: Diagnosis not present

## 2019-08-30 MED ORDER — NITROGLYCERIN 0.4 MG SL SUBL: 0.4000 mg | SUBLINGUAL_TABLET | SUBLINGUAL | 2 refills | Status: DC | PRN

## 2019-08-30 MED ORDER — ISOSORBIDE MONONITRATE ER 30 MG PO TB24: 30.0000 mg | ORAL_TABLET | Freq: Every day | ORAL | 5 refills | Status: DC

## 2019-08-30 MED ORDER — BENAZEPRIL HCL 10 MG PO TABS: 10.0000 mg | ORAL_TABLET | Freq: Every day | ORAL | 5 refills | Status: DC

## 2019-08-30 NOTE — Patient Instructions (Signed)
Medication Instructions:  Your physician has recommended you make the following change in your medication:  1- DECREASE Benazepril to 10 mg by mouth once a day. 2- START Imdur 30 mg by mouth once a day. 3- Nitroglycerin as needed for chest pain - Dissolve 1 tablet (0.4 mg) under your tongue every 5 minutes as needed for chest pain. Do not take more than 3 doses. If chest pain does not resolve, then call 911 or go to the Emergency Room.    *If you need a refill on your cardiac medications before your next appointment, please call your pharmacy*  Lab Work: COVID PRE- TEST: You will need a COVID TEST prior to the procedure:  LOCATION: [place] [facility] Testing site.  DATE/TIME:  Friday, September 01, 2019.  If you have labs (blood work) drawn today and your tests are completely normal, you will receive your results only by: Marland Kitchen MyChart Message (if you have MyChart) OR . A paper copy in the mail If you have any lab test that is abnormal or we need to change your treatment, we will call you to review the results.  Testing/Procedures:  You are scheduled for a Cardiac Catheterization on Tuesday, November 17 with Dr. Harrell Gave End.  1. Please arrive at the [facility] Entrance at 7:30 AM (This time is two hours before your procedure to ensure your preparation). Free valet parking service is available.   Special note: Every effort is made to have your procedure done on time. Please understand that emergencies sometimes delay scheduled procedures.  2. Diet: Do not eat solid foods after midnight.  The patient may have clear liquids until 5am upon the day of the procedure.  3. Labs: You will need to have COVID TEST.  COVID PRE- TEST: You will need a COVID TEST prior to the procedure:  LOCATION: [place] [facility] Testing site.  DATE/TIME:  Friday, September 01, 2019.   4. Medication instructions in preparation for your procedure:   Contrast Allergy:  No  Take only 1/2 dose units of insulin the night before your procedure. Do not take any insulin on the day of the procedure.  On the morning of your procedure, take your Aspirin and any morning medicines NOT listed above.  You may use sips of water.  5. Plan for one night stay--bring personal belongings. 6. Bring a current list of your medications and current insurance cards. 7. You MUST have a responsible person to drive you home. 8. Someone MUST be with you the first 24 hours after you arrive home or your discharge will be delayed. 9. Please wear clothes that are easy to get on and off and wear slip-on shoes.  Thank you for allowing [place] to care for you!   -- [place] [facility]   Follow-Up: At [facility], you and your health needs are our priority.  As part of our continuing mission to provide you with exceptional heart care, we have created designated Provider Care Teams.  These Care Teams include your primary Cardiologist (physician) and Advanced Practice Providers (APPs -  Physician Assistants and Nurse Practitioners) who all work together to provide you with the care you need, when you need it.  Your next appointment:   3 weeks with APP.  The format for your next appointment:   In Person  Provider:    You may see one of the following Advanced Practice Providers on your designated Care Team:    Harrell Gave  Sharolyn Douglas, NP  Christell Faith, PA-C  Marrianne Mood, PA-C

## 2019-08-31 ENCOUNTER — Telehealth: Payer: Self-pay | Admitting: Gastroenterology

## 2019-08-31 ENCOUNTER — Encounter: Payer: Self-pay | Admitting: Internal Medicine

## 2019-08-31 DIAGNOSIS — I2510 Atherosclerotic heart disease of native coronary artery without angina pectoris: Secondary | ICD-10-CM | POA: Insufficient documentation

## 2019-08-31 DIAGNOSIS — I25110 Atherosclerotic heart disease of native coronary artery with unstable angina pectoris: Secondary | ICD-10-CM | POA: Insufficient documentation

## 2019-08-31 DIAGNOSIS — I25118 Atherosclerotic heart disease of native coronary artery with other forms of angina pectoris: Secondary | ICD-10-CM | POA: Insufficient documentation

## 2019-08-31 DIAGNOSIS — E785 Hyperlipidemia, unspecified: Secondary | ICD-10-CM | POA: Insufficient documentation

## 2019-08-31 NOTE — Telephone Encounter (Signed)
Patient is calling to cancel colonoscopy because he is having heart surgery.He will call to r/s once he is cleared.

## 2019-09-01 ENCOUNTER — Inpatient Hospital Stay: Payer: Medicare Other | Admitting: Nurse Practitioner

## 2019-09-01 ENCOUNTER — Other Ambulatory Visit: Payer: Self-pay

## 2019-09-01 ENCOUNTER — Other Ambulatory Visit: Payer: Self-pay | Admitting: Internal Medicine

## 2019-09-01 ENCOUNTER — Other Ambulatory Visit
Admission: RE | Admit: 2019-09-01 | Discharge: 2019-09-01 | Disposition: A | Discharge status: Home / Self Care | Payer: Medicare Other | Source: Ambulatory Visit | Attending: Internal Medicine | Admitting: Internal Medicine

## 2019-09-01 DIAGNOSIS — Z01812 Encounter for preprocedural laboratory examination: Secondary | ICD-10-CM | POA: Insufficient documentation

## 2019-09-01 DIAGNOSIS — Z20828 Contact with and (suspected) exposure to other viral communicable diseases: Secondary | ICD-10-CM | POA: Diagnosis not present

## 2019-09-01 DIAGNOSIS — I25110 Atherosclerotic heart disease of native coronary artery with unstable angina pectoris: Secondary | ICD-10-CM

## 2019-09-01 NOTE — Telephone Encounter (Signed)
Contacted [facility] endoscopy and cancelled colonoscopy.

## 2019-09-02 LAB — SARS CORONAVIRUS 2 (TAT 6-24 HRS): SARS Coronavirus 2: NEGATIVE

## 2019-09-04 ENCOUNTER — Telehealth: Payer: Self-pay | Admitting: Family Medicine

## 2019-09-04 DIAGNOSIS — G894 Chronic pain syndrome: Secondary | ICD-10-CM | POA: Diagnosis not present

## 2019-09-04 NOTE — Telephone Encounter (Signed)
Called pt he is wanting to be worked in today. Spoke with Dr Wynetta Emery pt needs to have a hosp fu, he states that he had one scheduled for last Friday 11/13 but wasn't able to keep it per needing to get covid test for his procedure tomorrow. Pt hung up.

## 2019-09-04 NOTE — Telephone Encounter (Signed)
He will need an  Appointment. His sugars are not back to normal from what I can see.

## 2019-09-04 NOTE — Telephone Encounter (Signed)
Copied from [place] [id number]. Topic: General - Other >> Sep 04, 2019 11:48 AM Rayann Heman wrote: Reason for CRM: pt called and stated that he was seen at pain clinic for medication. Pt states that they would not give him medication because of his blood sugar. Pt states that he needs something faxed over to the pain clinic [facility]) that states sugar is back to normal. Please advise >> Sep 04, 2019  2:18 PM Rainey Pines A wrote: Pain management is wanting fax sent over with patients discharge notes so that patient can be seen. Fax [phone] >> Sep 04, 2019  1:18 PM Wynetta Emery, [place] C wrote: Pt says that he's been out of his medication for 2 days, pt says he would like to have his Rx sent to pain management as soon as possible

## 2019-09-04 NOTE — Telephone Encounter (Signed)
It looks like he has an appointment on Thursday.

## 2019-09-05 ENCOUNTER — Other Ambulatory Visit: Payer: Self-pay

## 2019-09-05 ENCOUNTER — Encounter: Payer: Self-pay | Admitting: *Deleted

## 2019-09-05 ENCOUNTER — Encounter
Admission: RE | Disposition: A | Discharge status: Home / Self Care | Payer: Self-pay | Source: Home / Self Care | Attending: Internal Medicine

## 2019-09-05 ENCOUNTER — Ambulatory Visit
Admission: RE | Admit: 2019-09-05 | Discharge: 2019-09-06 | Disposition: A | Discharge status: Home / Self Care | Payer: Medicare Other | Attending: Internal Medicine | Admitting: Internal Medicine

## 2019-09-05 DIAGNOSIS — Z87891 Personal history of nicotine dependence: Secondary | ICD-10-CM | POA: Diagnosis not present

## 2019-09-05 DIAGNOSIS — I252 Old myocardial infarction: Secondary | ICD-10-CM | POA: Insufficient documentation

## 2019-09-05 DIAGNOSIS — Z8673 Personal history of transient ischemic attack (TIA), and cerebral infarction without residual deficits: Secondary | ICD-10-CM | POA: Diagnosis not present

## 2019-09-05 DIAGNOSIS — Z885 Allergy status to narcotic agent status: Secondary | ICD-10-CM | POA: Insufficient documentation

## 2019-09-05 DIAGNOSIS — J45909 Unspecified asthma, uncomplicated: Secondary | ICD-10-CM | POA: Insufficient documentation

## 2019-09-05 DIAGNOSIS — I25118 Atherosclerotic heart disease of native coronary artery with other forms of angina pectoris: Secondary | ICD-10-CM | POA: Diagnosis present

## 2019-09-05 DIAGNOSIS — Z6836 Body mass index (BMI) 36.0-36.9, adult: Secondary | ICD-10-CM | POA: Diagnosis not present

## 2019-09-05 DIAGNOSIS — E785 Hyperlipidemia, unspecified: Secondary | ICD-10-CM | POA: Diagnosis not present

## 2019-09-05 DIAGNOSIS — I152 Hypertension secondary to endocrine disorders: Secondary | ICD-10-CM | POA: Diagnosis present

## 2019-09-05 DIAGNOSIS — E669 Obesity, unspecified: Secondary | ICD-10-CM | POA: Diagnosis not present

## 2019-09-05 DIAGNOSIS — Z7982 Long term (current) use of aspirin: Secondary | ICD-10-CM | POA: Insufficient documentation

## 2019-09-05 DIAGNOSIS — I25110 Atherosclerotic heart disease of native coronary artery with unstable angina pectoris: Secondary | ICD-10-CM | POA: Diagnosis not present

## 2019-09-05 DIAGNOSIS — E11649 Type 2 diabetes mellitus with hypoglycemia without coma: Secondary | ICD-10-CM | POA: Insufficient documentation

## 2019-09-05 DIAGNOSIS — Z79899 Other long term (current) drug therapy: Secondary | ICD-10-CM | POA: Diagnosis not present

## 2019-09-05 DIAGNOSIS — E1169 Type 2 diabetes mellitus with other specified complication: Secondary | ICD-10-CM | POA: Diagnosis present

## 2019-09-05 DIAGNOSIS — I200 Unstable angina: Secondary | ICD-10-CM | POA: Diagnosis present

## 2019-09-05 DIAGNOSIS — Z791 Long term (current) use of non-steroidal anti-inflammatories (NSAID): Secondary | ICD-10-CM | POA: Diagnosis not present

## 2019-09-05 DIAGNOSIS — E1159 Type 2 diabetes mellitus with other circulatory complications: Secondary | ICD-10-CM | POA: Diagnosis present

## 2019-09-05 DIAGNOSIS — R079 Chest pain, unspecified: Secondary | ICD-10-CM

## 2019-09-05 DIAGNOSIS — K219 Gastro-esophageal reflux disease without esophagitis: Secondary | ICD-10-CM | POA: Insufficient documentation

## 2019-09-05 DIAGNOSIS — Z888 Allergy status to other drugs, medicaments and biological substances status: Secondary | ICD-10-CM | POA: Insufficient documentation

## 2019-09-05 DIAGNOSIS — Z794 Long term (current) use of insulin: Secondary | ICD-10-CM | POA: Insufficient documentation

## 2019-09-05 DIAGNOSIS — I2510 Atherosclerotic heart disease of native coronary artery without angina pectoris: Secondary | ICD-10-CM | POA: Diagnosis present

## 2019-09-05 DIAGNOSIS — Z8505 Personal history of malignant neoplasm of liver: Secondary | ICD-10-CM | POA: Diagnosis not present

## 2019-09-05 DIAGNOSIS — R569 Unspecified convulsions: Secondary | ICD-10-CM | POA: Diagnosis not present

## 2019-09-05 DIAGNOSIS — I10 Essential (primary) hypertension: Secondary | ICD-10-CM | POA: Diagnosis present

## 2019-09-05 DIAGNOSIS — Z9861 Coronary angioplasty status: Secondary | ICD-10-CM | POA: Diagnosis not present

## 2019-09-05 HISTORY — PX: INTRAVASCULAR PRESSURE WIRE/FFR STUDY: CATH118243

## 2019-09-05 HISTORY — PX: LEFT HEART CATH AND CORONARY ANGIOGRAPHY: CATH118249

## 2019-09-05 HISTORY — PX: CORONARY PRESSURE/FFR STUDY: CATH118243

## 2019-09-05 LAB — GLUCOSE, CAPILLARY
Glucose-Capillary: 167 mg/dL — ABNORMAL HIGH (ref 70–99)
Glucose-Capillary: 147 mg/dL — ABNORMAL HIGH (ref 70–99)
Glucose-Capillary: 140 mg/dL — ABNORMAL HIGH (ref 70–99)
Glucose-Capillary: 156 mg/dL — ABNORMAL HIGH (ref 70–99)
Glucose-Capillary: 97 mg/dL (ref 70–99)
Glucose-Capillary: 98 mg/dL (ref 70–99)

## 2019-09-05 LAB — POCT ACTIVATED CLOTTING TIME
Activated Clotting Time: 246 seconds
Activated Clotting Time: 279 seconds
Activated Clotting Time: 290 seconds

## 2019-09-05 SURGERY — LEFT HEART CATH AND CORONARY ANGIOGRAPHY
Anesthesia: Moderate Sedation

## 2019-09-05 MED ORDER — NITROGLYCERIN 0.4 MG SL SUBL: 0.4000 mg | SUBLINGUAL_TABLET | SUBLINGUAL | Status: DC | PRN

## 2019-09-05 MED ORDER — INFLUENZA VAC SPLIT QUAD 0.5 ML IM SUSY: 0.5000 mL | PREFILLED_SYRINGE | INTRAMUSCULAR | Status: DC

## 2019-09-05 MED ORDER — HYDRALAZINE HCL 20 MG/ML IJ SOLN: 10.0000 mg | INTRAMUSCULAR | Status: AC | PRN

## 2019-09-05 MED ORDER — CYCLOBENZAPRINE HCL 10 MG PO TABS
10.0000 mg | ORAL_TABLET | Freq: Three times a day (TID) | ORAL | Status: DC | PRN
  Administered 2019-09-06: 10 mg via ORAL
  Filled 2019-09-05 (×2): qty 1

## 2019-09-05 MED ORDER — OXYCODONE HCL 5 MG PO TABS
15.0000 mg | ORAL_TABLET | ORAL | Status: DC | PRN
  Administered 2019-09-05 – 2019-09-06 (×6): 15 mg via ORAL
  Filled 2019-09-05 (×6): qty 3

## 2019-09-05 MED ORDER — MIDAZOLAM HCL 2 MG/2ML IJ SOLN
INTRAMUSCULAR | Status: AC
  Filled 2019-09-05: qty 2

## 2019-09-05 MED ORDER — ASPIRIN EC 81 MG PO TBEC
81.0000 mg | DELAYED_RELEASE_TABLET | Freq: Every day | ORAL | Status: DC
  Administered 2019-09-06: 81 mg via ORAL
  Filled 2019-09-05: qty 1

## 2019-09-05 MED ORDER — PREGABALIN 50 MG PO CAPS
50.0000 mg | ORAL_CAPSULE | Freq: Three times a day (TID) | ORAL | Status: DC
  Administered 2019-09-05 – 2019-09-06 (×3): 50 mg via ORAL
  Filled 2019-09-05 (×3): qty 1

## 2019-09-05 MED ORDER — SODIUM CHLORIDE 0.9 % IV SOLN: 250.0000 mL | INTRAVENOUS | Status: DC | PRN

## 2019-09-05 MED ORDER — MIDAZOLAM HCL 2 MG/2ML IJ SOLN
INTRAMUSCULAR | Status: DC | PRN
  Administered 2019-09-05 (×2): 1 mg via INTRAVENOUS

## 2019-09-05 MED ORDER — NITROGLYCERIN 1 MG/10 ML FOR IR/CATH LAB
INTRA_ARTERIAL | Status: AC
  Filled 2019-09-05: qty 10

## 2019-09-05 MED ORDER — ALBUTEROL SULFATE HFA 108 (90 BASE) MCG/ACT IN AERS: 2.0000 | INHALATION_SPRAY | Freq: Four times a day (QID) | RESPIRATORY_TRACT | Status: DC | PRN

## 2019-09-05 MED ORDER — SODIUM CHLORIDE 0.9% FLUSH: 3.0000 mL | INTRAVENOUS | Status: DC | PRN

## 2019-09-05 MED ORDER — DOXEPIN HCL 50 MG PO CAPS
50.0000 mg | ORAL_CAPSULE | Freq: Every day | ORAL | Status: DC
  Administered 2019-09-05: 22:00:00 50 mg via ORAL
  Filled 2019-09-05 (×2): qty 1

## 2019-09-05 MED ORDER — HEPARIN SODIUM (PORCINE) 1000 UNIT/ML IJ SOLN
INTRAMUSCULAR | Status: AC
  Filled 2019-09-05: qty 1

## 2019-09-05 MED ORDER — NITROGLYCERIN 1 MG/10 ML FOR IR/CATH LAB
INTRA_ARTERIAL | Status: DC | PRN
  Administered 2019-09-05 (×2): 200 ug via INTRACORONARY

## 2019-09-05 MED ORDER — HEPARIN (PORCINE) IN NACL 1000-0.9 UT/500ML-% IV SOLN
INTRAVENOUS | Status: AC
  Filled 2019-09-05: qty 1000

## 2019-09-05 MED ORDER — INSULIN ASPART 100 UNIT/ML ~~LOC~~ SOLN
14.0000 [IU] | Freq: Three times a day (TID) | SUBCUTANEOUS | Status: DC
  Administered 2019-09-05: 14 [IU] via SUBCUTANEOUS
  Filled 2019-09-05: qty 1

## 2019-09-05 MED ORDER — CEPHALEXIN 500 MG PO CAPS
500.0000 mg | ORAL_CAPSULE | Freq: Two times a day (BID) | ORAL | Status: DC
  Administered 2019-09-05 – 2019-09-06 (×3): 500 mg via ORAL
  Filled 2019-09-05 (×3): qty 1

## 2019-09-05 MED ORDER — DIPHENHYDRAMINE HCL 25 MG PO CAPS
25.0000 mg | ORAL_CAPSULE | Freq: Four times a day (QID) | ORAL | Status: DC | PRN
  Administered 2019-09-05: 22:00:00 25 mg via ORAL
  Filled 2019-09-05: qty 1

## 2019-09-05 MED ORDER — SODIUM CHLORIDE 0.9% FLUSH: 3.0000 mL | Freq: Two times a day (BID) | INTRAVENOUS | Status: DC

## 2019-09-05 MED ORDER — CLOPIDOGREL BISULFATE 75 MG PO TABS
75.0000 mg | ORAL_TABLET | Freq: Every day | ORAL | Status: DC
  Administered 2019-09-06: 75 mg via ORAL
  Filled 2019-09-05: qty 1

## 2019-09-05 MED ORDER — HEPARIN SODIUM (PORCINE) 1000 UNIT/ML IJ SOLN
INTRAMUSCULAR | Status: DC | PRN
  Administered 2019-09-05: 2000 [IU] via INTRAVENOUS
  Administered 2019-09-05: 9000 [IU] via INTRAVENOUS
  Administered 2019-09-05: 3000 [IU] via INTRAVENOUS

## 2019-09-05 MED ORDER — SODIUM CHLORIDE 0.9 % IV SOLN
INTRAVENOUS | Status: AC
  Administered 2019-09-05: 1000 mL via INTRAVENOUS

## 2019-09-05 MED ORDER — CLOPIDOGREL BISULFATE 75 MG PO TABS
ORAL_TABLET | ORAL | Status: AC
  Filled 2019-09-05: qty 8

## 2019-09-05 MED ORDER — DIPHENOXYLATE-ATROPINE 2.5-0.025 MG PO TABS: 1.0000 | ORAL_TABLET | Freq: Four times a day (QID) | ORAL | Status: DC | PRN

## 2019-09-05 MED ORDER — UMECLIDINIUM-VILANTEROL 62.5-25 MCG/INH IN AEPB
1.0000 | INHALATION_SPRAY | Freq: Every day | RESPIRATORY_TRACT | Status: DC
  Administered 2019-09-05 – 2019-09-06 (×2): 1 via RESPIRATORY_TRACT
  Filled 2019-09-05: qty 14

## 2019-09-05 MED ORDER — OXYCODONE HCL 5 MG PO TABS: 10.0000 mg | ORAL_TABLET | ORAL | Status: DC | PRN

## 2019-09-05 MED ORDER — INSULIN GLARGINE 100 UNIT/ML ~~LOC~~ SOLN: 24.0000 [IU] | Freq: Every day | SUBCUTANEOUS | Status: DC

## 2019-09-05 MED ORDER — ACETAMINOPHEN 325 MG PO TABS: 650.0000 mg | ORAL_TABLET | ORAL | Status: DC | PRN

## 2019-09-05 MED ORDER — SUCRALFATE 1 G PO TABS
1.0000 g | ORAL_TABLET | Freq: Three times a day (TID) | ORAL | Status: DC
  Administered 2019-09-05 – 2019-09-06 (×4): 1 g via ORAL
  Filled 2019-09-05 (×4): qty 1

## 2019-09-05 MED ORDER — SODIUM CHLORIDE 0.9 % WEIGHT BASED INFUSION
3.0000 mL/kg/h | INTRAVENOUS | Status: DC
  Administered 2019-09-05: 08:00:00 3 mL/kg/h via INTRAVENOUS

## 2019-09-05 MED ORDER — ONDANSETRON HCL 4 MG/2ML IJ SOLN: 4.0000 mg | Freq: Four times a day (QID) | INTRAMUSCULAR | Status: DC | PRN

## 2019-09-05 MED ORDER — CLOPIDOGREL BISULFATE 75 MG PO TABS
ORAL_TABLET | ORAL | Status: DC | PRN
  Administered 2019-09-05: 600 mg via ORAL

## 2019-09-05 MED ORDER — ISOSORBIDE MONONITRATE ER 30 MG PO TB24
30.0000 mg | ORAL_TABLET | Freq: Every day | ORAL | Status: DC
  Administered 2019-09-05 – 2019-09-06 (×2): 30 mg via ORAL
  Filled 2019-09-05 (×2): qty 1

## 2019-09-05 MED ORDER — VITAMIN B-12 1000 MCG PO TABS
1000.0000 ug | ORAL_TABLET | Freq: Every day | ORAL | Status: DC
  Administered 2019-09-06: 1000 ug via ORAL
  Filled 2019-09-05: qty 1

## 2019-09-05 MED ORDER — ALBUTEROL SULFATE (2.5 MG/3ML) 0.083% IN NEBU: 2.5000 mg | INHALATION_SOLUTION | Freq: Four times a day (QID) | RESPIRATORY_TRACT | Status: DC | PRN

## 2019-09-05 MED ORDER — LABETALOL HCL 5 MG/ML IV SOLN: 10.0000 mg | INTRAVENOUS | Status: AC | PRN

## 2019-09-05 MED ORDER — INSULIN ASPART 100 UNIT/ML ~~LOC~~ SOLN: 0.0000 [IU] | Freq: Every day | SUBCUTANEOUS | Status: DC

## 2019-09-05 MED ORDER — TRAZODONE HCL 100 MG PO TABS
100.0000 mg | ORAL_TABLET | Freq: Every evening | ORAL | Status: DC | PRN
  Administered 2019-09-05: 22:00:00 100 mg via ORAL
  Filled 2019-09-05: qty 1

## 2019-09-05 MED ORDER — ASPIRIN 81 MG PO CHEW
CHEWABLE_TABLET | ORAL | Status: AC
  Filled 2019-09-05: qty 1

## 2019-09-05 MED ORDER — HEPARIN (PORCINE) IN NACL 2000-0.9 UNIT/L-% IV SOLN
INTRAVENOUS | Status: DC | PRN
  Administered 2019-09-05: 500 mL

## 2019-09-05 MED ORDER — PANTOPRAZOLE SODIUM 40 MG PO TBEC
40.0000 mg | DELAYED_RELEASE_TABLET | Freq: Two times a day (BID) | ORAL | Status: DC
  Administered 2019-09-05 – 2019-09-06 (×2): 40 mg via ORAL
  Filled 2019-09-05 (×2): qty 1

## 2019-09-05 MED ORDER — BENAZEPRIL HCL 10 MG PO TABS
10.0000 mg | ORAL_TABLET | Freq: Every day | ORAL | Status: DC
  Administered 2019-09-06: 10 mg via ORAL
  Filled 2019-09-05: qty 1

## 2019-09-05 MED ORDER — IOHEXOL 300 MG/ML  SOLN
INTRAMUSCULAR | Status: DC | PRN
  Administered 2019-09-05: 170 mL

## 2019-09-05 MED ORDER — INSULIN ASPART 100 UNIT/ML ~~LOC~~ SOLN: 0.0000 [IU] | Freq: Three times a day (TID) | SUBCUTANEOUS | Status: DC

## 2019-09-05 MED ORDER — SODIUM CHLORIDE 0.9% FLUSH
3.0000 mL | Freq: Two times a day (BID) | INTRAVENOUS | Status: DC
  Administered 2019-09-05 – 2019-09-06 (×3): 3 mL via INTRAVENOUS

## 2019-09-05 MED ORDER — INSULIN GLARGINE 100 UNIT/ML ~~LOC~~ SOLN
24.0000 [IU] | Freq: Every day | SUBCUTANEOUS | Status: DC
  Administered 2019-09-05: 24 [IU] via SUBCUTANEOUS
  Filled 2019-09-05 (×2): qty 0.24

## 2019-09-05 MED ORDER — ATORVASTATIN CALCIUM 20 MG PO TABS
40.0000 mg | ORAL_TABLET | Freq: Every day | ORAL | Status: DC
  Administered 2019-09-05: 40 mg via ORAL
  Filled 2019-09-05: qty 2

## 2019-09-05 MED ORDER — DULOXETINE HCL 60 MG PO CPEP
60.0000 mg | ORAL_CAPSULE | Freq: Every day | ORAL | Status: DC
  Administered 2019-09-06: 09:00:00 60 mg via ORAL
  Filled 2019-09-05: qty 2
  Filled 2019-09-05: qty 1

## 2019-09-05 MED ORDER — FENTANYL CITRATE (PF) 100 MCG/2ML IJ SOLN
INTRAMUSCULAR | Status: DC | PRN
  Administered 2019-09-05 (×2): 25 ug via INTRAVENOUS
  Administered 2019-09-05: 50 ug via INTRAVENOUS

## 2019-09-05 MED ORDER — ASPIRIN 81 MG PO CHEW
81.0000 mg | CHEWABLE_TABLET | ORAL | Status: AC
  Administered 2019-09-05: 08:00:00 81 mg via ORAL

## 2019-09-05 MED ORDER — FENTANYL CITRATE (PF) 100 MCG/2ML IJ SOLN
INTRAMUSCULAR | Status: AC
  Filled 2019-09-05: qty 2

## 2019-09-05 MED ORDER — SODIUM CHLORIDE 0.9 % WEIGHT BASED INFUSION: 1.0000 mL/kg/h | INTRAVENOUS | Status: DC

## 2019-09-05 SURGICAL SUPPLY — 23 items
BALLN TREK RX 2.5X12 (BALLOONS) ×4
BALLN ~~LOC~~ TREK RX 3.0X12 (BALLOONS) ×4
BALLN ~~LOC~~ TREK RX 3.25X12 (BALLOONS) ×4
BALLOON TREK RX 2.5X12 (BALLOONS) IMPLANT
BALLOON ~~LOC~~ TREK RX 3.0X12 (BALLOONS) IMPLANT
BALLOON ~~LOC~~ TREK RX 3.25X12 (BALLOONS) IMPLANT
CANNULA 5F STIFF (CANNULA) ×2 IMPLANT
CATH INFINITI 5FR ANG PIGTAIL (CATHETERS) ×2 IMPLANT
CATH INFINITI 5FR JL4 (CATHETERS) ×2 IMPLANT
CATH INFINITI JR4 5F (CATHETERS) ×2 IMPLANT
CATH VISTA GUIDE 6FR XBLAD3.5 (CATHETERS) ×2 IMPLANT
DEVICE CLOSURE MYNXGRIP 6/7F (Vascular Products) ×2 IMPLANT
DEVICE INFLAT 30 PLUS (MISCELLANEOUS) ×2 IMPLANT
KIT MANI 3VAL PERCEP (MISCELLANEOUS) ×4 IMPLANT
NDL PERC 18GX7CM (NEEDLE) IMPLANT
NEEDLE PERC 18GX7CM (NEEDLE) ×4 IMPLANT
PACK CARDIAC CATH (CUSTOM PROCEDURE TRAY) ×4 IMPLANT
SHEATH AVANTI 5FR X 11CM (SHEATH) ×2 IMPLANT
SHEATH AVANTI 6FR X 11CM (SHEATH) ×4 IMPLANT
STENT RESOLUTE ONYX 2.75X18 (Permanent Stent) ×2 IMPLANT
WIRE G HI TQ BMW 190 (WIRE) ×2 IMPLANT
WIRE GUIDERIGHT .035X150 (WIRE) ×2 IMPLANT
WIRE PRESSURE VERRATA (WIRE) ×2 IMPLANT

## 2019-09-05 NOTE — Brief Op Note (Signed)
BRIEF CARDIAC CATHETERIZATION NOTE  09/05/2019  11:28 AM  PATIENT:  Edward Schneider.  63 y.o. male  PRE-OPERATIVE DIAGNOSIS:  Unstable angina  POST-OPERATIVE DIAGNOSIS:  Same  PROCEDURE:  Procedure(s): LEFT HEART CATH AND CORONARY ANGIOGRAPHY (Left) INTRAVASCULAR PRESSURE WIRE/FFR STUDY (N/A)  SURGEON:  Surgeon(s) and Role:    Nelva Bush, MD - Primary  FINDINGS: 1. Two vessel coronary artery disease with 60% proximal LAD stenosis that is hemodynamically significant (iFR 0.87) and moderate disease involving small rPL branches. 2. Normal left ventricular systolic function and filling pressure. 3. Successful PCI to proximal/mid LAD using Resolute Onyx 2.75 x 18 mm drug-eluting stent (post-dilated to 3.4 mm) with 0% residual stenosis and TIMI-3 flow.  RECOMMENDATIONS: 1. Overnight extended recovery. 2. Dual antiplatelet therapy with aspirin and clopidogrel for at least 12 months. 3. Aggressive secondary prevention. 4. Medical management of rPL disease.  Nelva Bush, MD [facility] Pager: [phone]

## 2019-09-05 NOTE — Plan of Care (Signed)
  Problem: Clinical Measurements: Goal: Cardiovascular complication will be avoided Outcome: Progressing   Problem: Activity: Goal: Risk for activity intolerance will decrease Outcome: Progressing   Problem: Nutrition: Goal: Adequate nutrition will be maintained Outcome: Progressing   Problem: Pain Managment: Goal: General experience of comfort will improve Outcome: Progressing   Problem: Safety: Goal: Ability to remain free from injury will improve Outcome: Progressing   Problem: Cardiovascular: Goal: Vascular access site(s) Level 0-1 will be maintained Outcome: Progressing

## 2019-09-05 NOTE — Plan of Care (Signed)
  Problem: Education: Goal: Knowledge of General Education information will improve Description: Including pain rating scale, medication(s)/side effects and non-pharmacologic comfort measures Outcome: Progressing   Problem: Health Behavior/Discharge Planning: Goal: Ability to manage health-related needs will improve Outcome: Progressing   Problem: Education: Goal: Understanding of CV disease, CV risk reduction, and recovery process will improve Outcome: Progressing   Problem: Activity: Goal: Ability to return to baseline activity level will improve Outcome: Progressing   Problem: Safety: Goal: Ability to remain free from injury will improve Outcome: Not Progressing

## 2019-09-05 NOTE — Interval H&P Note (Signed)
History and Physical Interval Note:  09/05/2019 8:43 AM  Edward Schneider.  has presented today for cardiac catheterization, with the diagnosis of unstable angina.  The various methods of treatment have been discussed with the patient and family. After consideration of risks, benefits and other options for treatment, the patient has consented to  Procedure(s): LEFT HEART CATH AND CORONARY ANGIOGRAPHY (Left) as a surgical intervention.  The patient's history has been reviewed, patient examined, no change in status, stable for surgery.  I have reviewed the patient's chart and labs.  Questions were answered to the patient's satisfaction.    Cath Lab Visit (complete for each Cath Lab visit)  Clinical Evaluation Leading to the Procedure:   ACS: No.  Non-ACS:    Anginal Classification: CCS IV  Anti-ischemic medical therapy: Minimal Therapy (1 class of medications)  Non-Invasive Test Results: No non-invasive testing performed  Prior CABG: No previous CABG  Edward Schneider

## 2019-09-06 ENCOUNTER — Encounter: Payer: Self-pay | Admitting: Internal Medicine

## 2019-09-06 DIAGNOSIS — I252 Old myocardial infarction: Secondary | ICD-10-CM | POA: Diagnosis not present

## 2019-09-06 DIAGNOSIS — Z885 Allergy status to narcotic agent status: Secondary | ICD-10-CM | POA: Diagnosis not present

## 2019-09-06 DIAGNOSIS — Z87891 Personal history of nicotine dependence: Secondary | ICD-10-CM | POA: Diagnosis not present

## 2019-09-06 DIAGNOSIS — E11649 Type 2 diabetes mellitus with hypoglycemia without coma: Secondary | ICD-10-CM | POA: Diagnosis not present

## 2019-09-06 DIAGNOSIS — I25110 Atherosclerotic heart disease of native coronary artery with unstable angina pectoris: Secondary | ICD-10-CM | POA: Diagnosis not present

## 2019-09-06 DIAGNOSIS — Z794 Long term (current) use of insulin: Secondary | ICD-10-CM | POA: Diagnosis not present

## 2019-09-06 DIAGNOSIS — Z8505 Personal history of malignant neoplasm of liver: Secondary | ICD-10-CM | POA: Diagnosis not present

## 2019-09-06 DIAGNOSIS — Z79899 Other long term (current) drug therapy: Secondary | ICD-10-CM | POA: Diagnosis not present

## 2019-09-06 DIAGNOSIS — I10 Essential (primary) hypertension: Secondary | ICD-10-CM

## 2019-09-06 DIAGNOSIS — J45909 Unspecified asthma, uncomplicated: Secondary | ICD-10-CM | POA: Diagnosis not present

## 2019-09-06 DIAGNOSIS — R569 Unspecified convulsions: Secondary | ICD-10-CM | POA: Diagnosis not present

## 2019-09-06 DIAGNOSIS — K219 Gastro-esophageal reflux disease without esophagitis: Secondary | ICD-10-CM | POA: Diagnosis not present

## 2019-09-06 DIAGNOSIS — Z7982 Long term (current) use of aspirin: Secondary | ICD-10-CM | POA: Diagnosis not present

## 2019-09-06 DIAGNOSIS — Z888 Allergy status to other drugs, medicaments and biological substances status: Secondary | ICD-10-CM | POA: Diagnosis not present

## 2019-09-06 DIAGNOSIS — Z8673 Personal history of transient ischemic attack (TIA), and cerebral infarction without residual deficits: Secondary | ICD-10-CM | POA: Diagnosis not present

## 2019-09-06 DIAGNOSIS — Z791 Long term (current) use of non-steroidal anti-inflammatories (NSAID): Secondary | ICD-10-CM | POA: Diagnosis not present

## 2019-09-06 DIAGNOSIS — E785 Hyperlipidemia, unspecified: Secondary | ICD-10-CM | POA: Diagnosis not present

## 2019-09-06 LAB — BASIC METABOLIC PANEL
Anion gap: 12 (ref 5–15)
BUN: 15 mg/dL (ref 8–23)
CO2: 26 mmol/L (ref 22–32)
Calcium: 9.2 mg/dL (ref 8.9–10.3)
Chloride: 101 mmol/L (ref 98–111)
Creatinine, Ser: 1.04 mg/dL (ref 0.61–1.24)
GFR calc Af Amer: >60 mL/min (ref >60)
GFR calc non Af Amer: >60 mL/min (ref >60)
Glucose, Bld: 129 mg/dL — ABNORMAL HIGH (ref 70–99)
Potassium: 4.2 mmol/L (ref 3.5–5.1)
Sodium: 139 mmol/L (ref 135–145)

## 2019-09-06 LAB — CBC
HCT: 33.5 % — ABNORMAL LOW (ref 39.0–52.0)
Hemoglobin: 10.7 g/dL — ABNORMAL LOW (ref 13.0–17.0)
MCH: 27.3 pg (ref 26.0–34.0)
MCHC: 31.9 g/dL (ref 30.0–36.0)
MCV: 85.5 fL (ref 80.0–100.0)
Platelets: 175 10*3/uL (ref 150–400)
RBC: 3.92 MIL/uL — ABNORMAL LOW (ref 4.22–5.81)
RDW: 13.3 % (ref 11.5–15.5)
WBC: 8.9 10*3/uL (ref 4.0–10.5)
nRBC: 0 % (ref 0.0–0.2)

## 2019-09-06 LAB — GLUCOSE, CAPILLARY
Glucose-Capillary: 116 mg/dL — ABNORMAL HIGH (ref 70–99)
Glucose-Capillary: 110 mg/dL — ABNORMAL HIGH (ref 70–99)

## 2019-09-06 MED ORDER — EPINEPHRINE 0.3 MG/0.3ML IJ SOAJ: 0.3000 mg | INTRAMUSCULAR | 1 refills | Status: DC | PRN

## 2019-09-06 MED ORDER — ASPIRIN 81 MG PO TBEC: 81.0000 mg | DELAYED_RELEASE_TABLET | Freq: Every day | ORAL | 11 refills | Status: DC

## 2019-09-06 MED ORDER — CLOPIDOGREL BISULFATE 75 MG PO TABS: 75.0000 mg | ORAL_TABLET | Freq: Every day | ORAL | 11 refills | Status: DC

## 2019-09-06 NOTE — Discharge Instructions (Signed)
Please review your medications carefully as there have been some changes.  It is very important that you take all medications, especially aspirin and Plavix, which is also known as clopidogrel.  If you have any issues and obtaining your cardiac medications please call us anytime of day, night, weekend, or holiday.    Coronary Artery Disease, Male Coronary artery disease (CAD) is a condition in which the arteries that lead to the heart (coronary arteries) become narrow or blocked. The narrowing or blockage can lead to decreased blood flow to the heart. Prolonged reduced blood flow can cause a heart attack (myocardial infarction or MI). This condition may also be called coronary heart disease. Because CAD is the leading cause of death in men, it is important to understand what causes this condition and how it is treated. What are the causes? CAD is most often caused by atherosclerosis. This is the buildup of fat and cholesterol (plaque) on the inside of the arteries. Over time, the plaque may narrow or block the artery, reducing blood flow to the heart. Plaque can also become weak and break off within a coronary artery and cause a sudden blockage. Other less common causes of CAD include:  A blood clot or a piece of a blood clot or other substance that blocks the flow of blood in a coronary artery (embolism).  A tearing of the artery (spontaneous coronary artery dissection).  An enlargement of an artery (aneurysm).  Inflammation (vasculitis) in the artery wall. What increases the risk? The following factors may make you more likely to develop this condition:  Age. Men over age 5 are at a greater risk of CAD.  Family history of CAD.  Gender. Men often develop CAD earlier in life than women.  High blood pressure (hypertension).  Diabetes.  High cholesterol levels.  Tobacco use.  Excessive alcohol use.  Lack of exercise.  A diet high in saturated and trans fats, such as fried  food and processed meat. Other possible risk factors include:  High stress levels.  Depression.  Obesity.  Sleep apnea. What are the signs or symptoms? Many people do not have any symptoms during the early stages of CAD. As the condition progresses, symptoms may include:  Chest pain (angina). The pain can: ? Feel like crushing or squeezing, or like a tightness, pressure, fullness, or heaviness in the chest. ? Last more than a few minutes or can stop and recur. The pain tends to get worse with exercise or stress and to fade with rest.  Pain in the arms, neck, jaw, ear, or back.  Unexplained heartburn or indigestion.  Shortness of breath.  Nausea or vomiting.  Sudden light-headedness.  Sudden cold sweats.  Fluttering or fast heartbeat (palpitations). How is this diagnosed? This condition is diagnosed based on:  Your family and medical history.  A physical exam.  Tests, including: ? A test to check the electrical signals in your heart (electrocardiogram). ? Exercise stress test. This looks for signs of blockage when the heart is stressed with exercise, such as running on a treadmill. ? Pharmacologic stress test. This test looks for signs of blockage when the heart is being stressed with a medicine. ? Blood tests. ? Coronary angiogram. This is a procedure to look at the coronary arteries to see if there is any blockage. During this test, a dye is injected into your arteries so they appear on an X-ray. ? Coronary artery CT scan. This CT scan helps detect calcium deposits in your  coronary arteries. Calcium deposits are an indicator of CAD. ? A test that uses sound waves to take a picture of your heart (echocardiogram). ? Chest X-ray. How is this treated? This condition may be treated by:  Healthy lifestyle changes to reduce risk factors.  Medicines such as: ? Antiplatelet medicines and blood-thinning medicines, such as aspirin. These help to prevent blood  clots. ? Nitroglycerin. ? Blood pressure medicines. ? Cholesterol-lowering medicine.  Coronary angioplasty and stenting. During this procedure, a thin, flexible tube is inserted through a blood vessel and into a blocked artery. A balloon or similar device on the end of the tube is inflated to open up the artery. In some cases, a small, mesh tube (stent) is inserted into the artery to keep it open.  Coronary artery bypass surgery. During this surgery, veins or arteries from other parts of the body are used to create a bypass around the blockage and allow blood to reach your heart. Follow these instructions at home: Medicines  Take over-the-counter and prescription medicines only as told by your health care provider.  Do not take the following medicines unless your health care provider approves: ? NSAIDs, such as ibuprofen, naproxen, or celecoxib. ? Vitamin supplements that contain vitamin A, vitamin E, or both. Lifestyle  Follow an exercise program approved by your health care provider. Aim for 150 minutes of moderate exercise or 75 minutes of vigorous exercise each week.  Maintain a healthy weight or lose weight as approved by your health care provider.  Learn to manage stress or try to limit your stress. Ask your health care provider for suggestions if you need help.  Get screened for depression and seek treatment, if needed.  Do not use any products that contain nicotine or tobacco, such as cigarettes, e-cigarettes, and chewing tobacco. If you need help quitting, ask your health care provider.  Do not use illegal drugs. Eating and drinking   Follow a heart-healthy diet. A dietitian can help educate you about healthy food options and changes. In general, eat plenty of fruits and vegetables, lean meats, and whole grains.  Avoid foods high in: ? Sugar. ? Salt (sodium). ? Saturated fat, such as processed or fatty meat. ? Trans fat, such as fried foods.  Use healthy cooking  methods such as roasting, grilling, broiling, baking, poaching, steaming, or stir-frying.  Do not drink alcohol if your health care provider tells you not to drink.  If you drink alcohol: ? Limit how much you have to 0-2 drinks per day. ? Be aware of how much alcohol is in your drink. In the [place] one drink equals one 12 oz bottle of beer (355 mL), one 5 oz glass of wine (148 mL), or one 1 oz glass of hard liquor (44 mL). General instructions  Manage any other health conditions, such as hypertension and diabetes. These conditions affect your heart.  Your health care provider may ask you to monitor your blood pressure. Ideally, your blood pressure should be below 130/80.  Keep all follow-up visits as told by your health care provider. This is important. Get help right away if:  You have pain in your chest, neck, ear, arm, jaw, stomach, or back that: ? Lasts more than a few minutes. ? Is recurring. ? Is not relieved by taking medicine under your tongue (sublingual nitroglycerin).  You have profuse sweating without cause.  You have unexplained: ? Heartburn or indigestion. ? Shortness of breath or difficulty breathing. ? Fluttering or fast heartbeat (palpitations). ?  Nausea or vomiting. ? Fatigue. ? Feelings of nervousness or anxiety. ? Weakness. ? Diarrhea.  You have sudden light-headedness or dizziness.  You faint.  You feel like hurting yourself or think about taking your own life. These symptoms may represent a serious problem that is an emergency. Do not wait to see if the symptoms will go away. Get medical help right away. Call your local emergency services (911 in the [place] Do not drive yourself to the hospital. Summary  Coronary artery disease (CAD) is a condition in which the arteries that lead to the heart (coronary arteries) become narrow or blocked. The narrowing or blockage can lead to a heart attack.  Many people do not have any symptoms during the early stages  of CAD.  CAD can be treated with lifestyle changes, medicines, surgery, or a combination of these treatments. This information is not intended to replace advice given to you by your health care provider. Make sure you discuss any questions you have with your health care provider. Document Released: 05/02/2014 Document Revised: 06/24/2018 Document Reviewed: 06/14/2018 Elsevier Patient Education  2020 [place].    Femoral Site Care This sheet gives you information about how to care for yourself after your procedure. Your health care provider may also give you more specific instructions. If you have problems or questions, contact your health care provider. What can I expect after the procedure? After the procedure, it is common to have:  Bruising that usually fades within 1-2 weeks.  Tenderness at the site. Follow these instructions at home: Wound care  Follow instructions from your health care provider about how to take care of your insertion site. Make sure you: ? Wash your hands with soap and water before you change your bandage (dressing). If soap and water are not available, use hand sanitizer. ? Change your dressing as told by your health care provider. ? Leave stitches (sutures), skin glue, or adhesive strips in place. These skin closures may need to stay in place for 2 weeks or longer. If adhesive strip edges start to loosen and curl up, you may trim the loose edges. Do not remove adhesive strips completely unless your health care provider tells you to do that.  Do not take baths, swim, or use a hot tub until your health care provider approves.  You may shower 24-48 hours after the procedure or as told by your health care provider. ? Gently wash the site with plain soap and water. ? Pat the area dry with a clean towel. ? Do not rub the site. This may cause bleeding.  Do not apply powder or lotion to the site. Keep the site clean and dry.  Check your femoral site every day  for signs of infection. Check for: ? Redness, swelling, or pain. ? Fluid or blood. ? Warmth. ? Pus or a bad smell. Activity  For the first 2-3 days after your procedure, or as long as directed: ? Avoid climbing stairs as much as possible. ? Do not squat.  Do not lift anything that is heavier than 10 lb (4.5 kg), or the limit that you are told, until your health care provider says that it is safe.  Rest as directed. ? Avoid sitting for a long time without moving. Get up to take short walks every 1-2 hours.  Do not drive for 24 hours if you were given a medicine to help you relax (sedative). General instructions  Take over-the-counter and prescription medicines only as told by your  health care provider.  Keep all follow-up visits as told by your health care provider. This is important. Contact a health care provider if you have:  A fever or chills.  You have redness, swelling, or pain around your insertion site. Get help right away if:  The catheter insertion area swells very fast.  You pass out.  You suddenly start to sweat or your skin gets clammy.  The catheter insertion area is bleeding, and the bleeding does not stop when you hold steady pressure on the area.  The area near or just beyond the catheter insertion site becomes pale, cool, tingly, or numb. These symptoms may represent a serious problem that is an emergency. Do not wait to see if the symptoms will go away. Get medical help right away. Call your local emergency services (911 in the [place] Do not drive yourself to the hospital. Summary  After the procedure, it is common to have bruising that usually fades within 1-2 weeks.  Check your femoral site every day for signs of infection.  Do not lift anything that is heavier than 10 lb (4.5 kg), or the limit that you are told, until your health care provider says that it is safe. This information is not intended to replace advice given to you by your health care  provider. Make sure you discuss any questions you have with your health care provider. Document Released: 06/08/2014 Document Revised: 10/18/2017 Document Reviewed: 10/18/2017 Elsevier Patient Education  2020 [employer].

## 2019-09-06 NOTE — Plan of Care (Signed)
  Problem: Education: Goal: Knowledge of General Education information will improve Description: Including pain rating scale, medication(s)/side effects and non-pharmacologic comfort measures Outcome: Completed/Met   Problem: Health Behavior/Discharge Planning: Goal: Ability to manage health-related needs will improve Outcome: Completed/Met   Problem: Clinical Measurements: Goal: Ability to maintain clinical measurements within normal limits will improve Outcome: Completed/Met Goal: Will remain free from infection Outcome: Completed/Met Goal: Diagnostic test results will improve Outcome: Completed/Met Goal: Respiratory complications will improve Outcome: Completed/Met Goal: Cardiovascular complication will be avoided Outcome: Completed/Met   Problem: Activity: Goal: Risk for activity intolerance will decrease Outcome: Completed/Met   Problem: Nutrition: Goal: Adequate nutrition will be maintained Outcome: Completed/Met   Problem: Coping: Goal: Level of anxiety will decrease Outcome: Completed/Met   Problem: Elimination: Goal: Will not experience complications related to bowel motility Outcome: Completed/Met Goal: Will not experience complications related to urinary retention Outcome: Completed/Met   Problem: Pain Managment: Goal: General experience of comfort will improve Outcome: Completed/Met   Problem: Education: Goal: Understanding of CV disease, CV risk reduction, and recovery process will improve Outcome: Completed/Met Goal: Individualized Educational Video(s) Outcome: Completed/Met   Problem: Activity: Goal: Ability to return to baseline activity level will improve Outcome: Completed/Met   Problem: Cardiovascular: Goal: Ability to achieve and maintain adequate cardiovascular perfusion will improve Outcome: Completed/Met Goal: Vascular access site(s) Level 0-1 will be maintained Outcome: Completed/Met   Problem: Health Behavior/Discharge  Planning: Goal: Ability to safely manage health-related needs after discharge will improve Outcome: Completed/Met

## 2019-09-06 NOTE — Discharge Summary (Signed)
Discharge Summary    Patient ID: Edward Schneider. MRN: [id number]; DOB: October 04, 1956  Admit date: 09/05/2019 Discharge date: 09/06/2019  Primary Care Provider: Steele Sizer, MD  Primary Cardiologist: End Primary Electrophysiologist:  None   Discharge Diagnoses    Principal Problem:   Coronary artery disease involving native coronary artery of native heart with unstable angina pectoris [facility]) Active Problems:   Hyperlipidemia associated with type 2 diabetes mellitus (HCC)   Essential hypertension   Hyperlipidemia LDL goal <70   Unstable angina [facility])    Diagnostic Studies/Procedures    LHC 09/05/2019: Conclusions: 1. Two vessel coronary artery disease with 60% proximal LAD stenosis that is hemodynamically significant (iFR 0.87) and moderate disease involving rPL branches. 2. Normal left ventricular systolic function and filling pressure. 3. Successful PCI to proximal/mid LAD using Resolute Onyx 2.75 x 18 mm drug-eluting stent (post-dilated to 3.4 mm) with 0% residual stenosis and TIMI-3 flow.  Recommendations: 1. Overnight extended recovery. 2. Dual antiplatelet therapy with aspirin and clopidogrel for at least 12 months. 3. Aggressive secondary prevention. 4. Medical management of rPL disease.  Diagnostic Dominance: Right Left Main  Vessel is large.  Left Anterior Descending  Vessel is large.  Prox LAD to Mid LAD lesion 60% stenosed  Prox LAD to Mid LAD lesion is 60% stenosed. Pressure wire/FFR was performed on the lesion. iFR = 0.87.  First Diagonal Branch  Vessel is small in size.  Second Diagonal Branch  Vessel is large in size.  Third Diagonal Branch  Vessel is small in size.  Ramus Intermedius  Vessel is moderate in size.  Left Circumflex  Vessel is moderate in size.  Prox Cx to Mid Cx lesion 20% stenosed  Prox Cx to Mid Cx lesion is 20% stenosed.  Right Coronary Artery  Vessel is moderate in size.  Right Posterior Descending Artery  Vessel is  moderate in size.  Right Posterior Atrioventricular Artery  Vessel is moderate in size.  RPAV lesion 50% stenosed  RPAV lesion is 50% stenosed.  Intervention  Prox LAD to Mid LAD lesion  Stent  Lesion length: 15 mm. CATH VISTA GUIDE 6FR XBLAD3.5 guide catheter was inserted. Lesion crossed with guidewire using a WIRE PRESSURE VERRATA. Pre-stent angioplasty was performed using a BALLOON TREK RX 2.5X12. Maximum pressure: 8 atm. A drug-eluting stent was successfully placed using a STENT RESOLUTE ONYX [id number]. Maximum pressure: 16 atm. Post-stent angioplasty was performed using a BALLOON [place] TREK RX 3.25X12. Maximum pressure: 16 atm.  Post-Intervention Lesion Assessment  The intervention was successful. Pre-interventional TIMI flow is 3. Post-intervention TIMI flow is 3. No complications occurred at this lesion.  There is a 0% residual stenosis post intervention.  Wall Motion  Resting               Left Heart  Left Ventricle The left ventricular size is normal. The left ventricular systolic function is normal. LV end diastolic pressure is normal. The left ventricular ejection fraction is 55-65% by visual estimate. No regional wall motion abnormalities.  Aortic Valve There is no aortic valve stenosis.  Coronary Diagrams  Diagnostic Dominance: Right  Intervention   Implants   Permanent Stent  Stent Resolute Onyx 2.75x18 - [id number] - Implanted Inventory item: Dreama Saa 5.62Z30 Model/Cat number: [id number]  Manufacturer: [employer] Lot number: [id number]  Device identifier: [id number] Device identifier type: GS1  Area Of Implantation: Mid LAD    GUDID Information  Request status Successful    Brand name: Resolute OnyxT Version/Model:  [id number]  Company name: [employer] MRI safety info as of 09/05/19: MR Conditional  Contains dry or latex rubber: No    GMDN P.T. name: Drug-eluting coronary artery stent, non-bioabsorbable-polymer-coated      As of 09/05/2019  Status: Implanted      Vascular Products  Device Closure Mynxgrip 6/63f - [id number] - Implanted Inventory item: DEVICE CLOSURE MYNXGRIP 6/25F Model/Cat number: [id number]  Manufacturer: [employer] Lot number: [id number]  Device identifier: [id number] Device identifier type: GS1  Area Of Implantation: Groin    GUDID Information  Request status Successful    Brand name: [facility] Version/Model: [id number]  Company name: [employer] MRI safety info as of 09/05/19: MR Safe  Contains dry or latex rubber: No    GMDN P.T. name: Wound hydrogel dressing, non-antimicrobial    As of 09/05/2019  Status: Implanted      _____________   History of Present Illness     Edward Schneider. is a 63 y.o. male with CAD with reported PTCA 20 to 30 years prior with details being unavailable, recurrent syncope, liver cancer, insulin-dependent diabetes, HTN, HLD, "mini stroke," asthma, DDD, obesity, and GERD who was evaluated by Dr. Okey Dupre presented patient on 08/30/2019 at the request of the patient's PCP for chest pain.  He has a history of recurrent syncope and has seen multiple [facility] clinic cardiologist over the prior 5 years, both inpatient and outpatient.  He was admitted with syncope in 12/2018 that was felt to be vasovagal in etiology.  Chest pain was atypical.  Troponin was negative.  Echo showed low normal LV systolic function without any other significant abnormalities.  More recently, the patient and wife have reported at least 3 "seizures" over the month of 07/2019 that were associated with sudden hypoglycemia.  After his first episode, he began noting left-sided chest pain that was described as an "elephant sitting on my chest."  He has been noting episodic worsening of shortness of breath since the "seizures" began.  He was seen in the [facility] on 08/28/2019 with chest pain and abdominal pain.  CTA chest abdomen and pelvis was notable for only mild stenosis at the origin of the  celiac artery.  High-sensitivity troponin negative x1.  In this setting, he was referred to Dr. And and evaluated on 08/30/2019 for chest pain concerning for unstable angina.  EKG showed sinus rhythm with first-degree block.  He was started on isosorbide and prescribed sublingual nitroglycerin.  He was continued on current medications including aspirin, Lipitor, and benazepril.  It appears he has not been maintained on beta-blocker secondary to relative hypotension and heart rates at times in the 50s bpm.  He was scheduled for diagnostic cath.    Hospital Course     Consultants: Cardiac rehab  Patient presented for scheduled diagnostic cath on 09/05/2019 which showed two-vessel CAD including 60% proximal LAD stenosis that was hemodynamically significant by IFR of 0.87, and moderate disease involving the RPL branches.  Normal LV systolic function and filling pressure.  He underwent successful PCI/DES to the proximal mid LAD using a resolute Onyx 2.75 x 18 mm drug-eluting stent with 0% residual stenosis and TIMI-3 flow.  Post procedure, he reported feeling "great" he had no further chest pain.  He has ambulated without difficulty.  He has tolerated the addition of Plavix without issues.  Post cath labs were stable.  Vital signs stable medications have been sent into his pharmacy.  He has been evaluated by Dr. Concha Norway 18 and felt to  be stable for discharge.  He will follow up as previously scheduled as outlined below.  Post-cath instructions were discussed in detail.  Importance of dual antiplatelet therapy were discussed in detail.  All questions have been answered and any concerns have been listened to.  Did the patient have an acute coronary syndrome (MI, NSTEMI, STEMI, etc) this admission?:  No                               Did the patient have a percutaneous coronary intervention (stent / angioplasty)?:  Yes.     Cath/PCI Registry Performance & Quality Measures: 1. Aspirin prescribed? - Yes 2. ADP  Receptor Inhibitor (Plavix/Clopidogrel, Brilinta/Ticagrelor or Effient/Prasugrel) prescribed (includes medically managed patients)? - Yes 3. High Intensity Statin (Lipitor 40-80mg  or Crestor 20-40mg ) prescribed? - Yes 4. For EF <40%, was ACEI/ARB prescribed? - Not Applicable (EF >/= 40%) 5. For EF <40%, Aldosterone Antagonist (Spironolactone or Eplerenone) prescribed? - Not Applicable (EF >/= 40%) 6. Cardiac Rehab Phase II ordered (Included Medically managed Patients)? - Yes   _____________  Discharge Vitals Blood pressure 103/63, pulse 63, temperature 97.9 F (36.6 C), temperature source Oral, resp. rate 19, height 5\' 8"  (1.727 m), weight 105.8 kg, SpO2 94 %.  Filed Weights   09/05/19 0750 09/05/19 1330 09/06/19 0404  Weight: 104.3 kg 107.4 kg 105.8 kg   Physical Exam: General: Well developed, well nourished, in no acute distress. Head: Normocephalic, atraumatic, sclera non-icteric, no xanthomas, nares without discharge.  Neck: Negative for carotid bruits. JVD not elevated. Lungs: Clear bilaterally to auscultation without wheezes, rales, or rhonchi. Breathing is unlabored. Heart: RRR with S1 S2. No murmurs, rubs, or gallops appreciated.  Right femoral cardiac cath site is well-healing without any active bleeding, bruising, warmth, swelling, erythema, or tenderness to palpation.  No bruit. Abdomen: Soft, non-tender, non-distended with normoactive bowel sounds. No hepatomegaly. No rebound/guarding. No obvious abdominal masses. Msk:  Strength and tone appear normal for age. Extremities: No clubbing or cyanosis. No edema. Distal pedal pulses are 2+ and equal bilaterally.  Multiple superficial excoriations are noted along the upper and lower extremities. Neuro: Alert and oriented X 3. No facial asymmetry. No focal deficit. Moves all extremities spontaneously. Psych:  Responds to questions appropriately with a normal affect.   EKG:  The EKG was personally reviewed and demonstrates: Sinus  bradycardia, 58 bpm, first-degree AV block, no acute ST-T changes Telemetry:  Telemetry was personally reviewed and demonstrates: Sinus bradycardia to sinus rhythm, 50s to 70s bpm    Labs & Radiologic Studies    CBC Recent Labs    09/06/19 0418  WBC 8.9  HGB 10.7*  HCT 33.5*  MCV 85.5  PLT 175   Basic Metabolic Panel Recent Labs    16/10/96 0418  NA 139  K 4.2  CL 101  CO2 26  GLUCOSE 129*  BUN 15  CREATININE 1.04  CALCIUM 9.2   Liver Function Tests No results for input(s): AST, ALT, ALKPHOS, BILITOT, PROT, ALBUMIN in the last 72 hours. No results for input(s): LIPASE, AMYLASE in the last 72 hours. High Sensitivity Troponin:   Recent Labs  Lab 08/22/19 1841 08/28/19 0901  TROPONINIHS 3 4    BNP Invalid input(s): POCBNP D-Dimer No results for input(s): DDIMER in the last 72 hours. Hemoglobin A1C No results for input(s): HGBA1C in the last 72 hours. Fasting Lipid Panel No results for input(s): CHOL, HDL, LDLCALC, TRIG, CHOLHDL, LDLDIRECT in the  last 72 hours. Thyroid Function Tests No results for input(s): TSH, T4TOTAL, T3FREE, THYROIDAB in the last 72 hours.  Invalid input(s): FREET3 _____________  Dg Chest 2 View  Result Date: 08/28/2019 IMPRESSION: No acute disease. Electronically Signed   By: Drusilla Kanner M.D.   On: 08/28/2019 09:33   Ct Angio Chest/abd/pel For Dissection W And/or Wo Contrast  Result Date: 08/28/2019 IMPRESSION: No evidence of thoracic or abdominal aortic dissection or aneurysm. Mild stenosis is noted at origin of celiac artery. No other significant abnormality seen in the chest, abdomen or pelvis. Electronically Signed   By: Lupita Raider M.D.   On: 08/28/2019 13:47   Disposition   Pt is being discharged home today in good condition.  Follow-up Plans & Appointments    Follow-up Information    [facility] Follow up.   Specialty: Cardiac Rehabilitation Why: Your Cardiologist has referred you for  outpatient Cardiac Rehab.  The Cardiac Rehab deartment will contact you by phone within one to two weeks of discharge to schedule your first appointment.   Contact information: [id number] [place] [address] [phone]       Sondra Barges, PA-C Follow up on 09/19/2019.   Specialties: Physician Assistant, Cardiology, Radiology Why: Appointment time 3 PM. Please arrive 20-30 minutes prior for screening.  Contact information: [address] [place] [address] [phone]          Discharge Instructions    AMB Referral to Cardiac Rehabilitation - Phase II   Complete by: As directed    Diagnosis: Coronary Stents   After initial evaluation and assessments completed: Virtual Based Care may be provided alone or in conjunction with Phase 2 Cardiac Rehab based on patient barriers.: Yes   Diet - low sodium heart healthy   Complete by: As directed    Increase activity slowly   Complete by: As directed       Discharge Medications   Allergies as of 09/06/2019      Reactions   Bee Pollen Anaphylaxis   Died 3 times when stung by bees. Carries Epi-pen at all times.   Bee Venom Anaphylaxis   Crestor [rosuvastatin Calcium] Shortness Of Breath, Swelling   Fentanyl Itching, Hives   blisters Patch   Gabapentin Diarrhea   Severe diarrhea which caused incontinence, loss of appetite and weight loss.   Shellfish Allergy Anaphylaxis, Swelling   Shrimp causes throat to swell and tingling in tongue. Can eat other white fish, crabcakes, and oysters.   Buprenorphine Hcl Itching   Morphine Itching   Simvastatin Diarrhea      Medication List    TAKE these medications   Accu-Chek Aviva Plus test strip Generic drug: glucose blood USE 1 STRIP TO CHECK GLUCOSE ONCE DAILY   Accu-Chek Softclix Lancets lancets USE 1  TO CHECK GLUCOSE ONCE DAILY   albuterol 108 (90 Base) MCG/ACT inhaler Commonly known as: Ventolin HFA Inhale 2 puffs into  the lungs every 6 (six) hours as needed for wheezing or shortness of breath.   Anoro Ellipta 62.5-25 MCG/INH Aepb Generic drug: umeclidinium-vilanterol Inhale 1 puff into the lungs daily.   aspirin 81 MG EC tablet Take 1 tablet (81 mg total) by mouth daily. Start taking on: September 07, 2019 What changed: how much to take   atorvastatin 40 MG tablet Commonly known as: LIPITOR Take 1 tablet (40 mg total) by mouth daily.   benazepril 10 MG tablet Commonly known as:  LOTENSIN Take 1 tablet (10 mg total) by mouth daily.   cephALEXin 500 MG capsule Commonly known as: KEFLEX Take 500 mg by mouth 2 (two) times daily.   CINNAMON PO Take 2,400 mg by mouth daily.   clopidogrel 75 MG tablet Commonly known as: PLAVIX Take 1 tablet (75 mg total) by mouth daily with breakfast. Start taking on: September 07, 2019   cyclobenzaprine 10 MG tablet Commonly known as: FLEXERIL Take 1 tablet (10 mg total) by mouth 3 (three) times daily as needed for muscle spasms.   diclofenac sodium 1 % Gel Commonly known as: VOLTAREN Apply 2 g topically 4 (four) times daily.   diphenhydrAMINE 25 mg capsule Commonly known as: BENADRYL Take 25 mg by mouth 2 (two) times daily.   diphenoxylate-atropine 2.5-0.025 MG tablet Commonly known as: Lomotil Take 1 tablet by mouth 4 (four) times daily as needed for diarrhea or loose stools.   doxepin 25 MG capsule Commonly known as: SINEQUAN Take 50 mg by mouth at bedtime.   DULoxetine 60 MG capsule Commonly known as: CYMBALTA Take 1 capsule (60 mg total) by mouth daily.   DULoxetine 30 MG capsule Commonly known as: CYMBALTA LIMIT 1 2 CAPSULES BY MOUTH PER DAY OF TOLERATED   EPIPEN IJ Inject as directed. What changed: Another medication with the same name was added. Make sure you understand how and when to take each.   EPINEPHrine 0.3 mg/0.3 mL Soaj injection Commonly known as: EPI-PEN Inject 0.3 mLs (0.3 mg total) into the muscle as needed for  anaphylaxis. What changed: You were already taking a medication with the same name, and this prescription was added. Make sure you understand how and when to take each.   GINGER PO Take 1 Dose by mouth daily.   Ginseng 100 MG Caps Take by mouth.   insulin aspart 100 UNIT/ML FlexPen Commonly known as: NOVOLOG Inject 14 Units into the skin 3 (three) times daily with meals.   Insulin Pen Needle 31G X 5 MM Misc Commonly known as: B-D UF III MINI PEN NEEDLES Daily   isosorbide mononitrate 30 MG 24 hr tablet Commonly known as: IMDUR Take 1 tablet (30 mg total) by mouth daily.   mupirocin ointment 2 % Commonly known as: BACTROBAN APPLY OINTMENT TOPICALLY TWICE DAILY   Narcan 4 MG/0.1ML Liqd nasal spray kit Generic drug: naloxone CALL 911. ADMINISTER A SINGLE SPRAY OF NARCAN IN ONE NOSTRIL. REPEAT EVERY 3 MINUTES AS NEEDED IF NO OR MINIMAL RESPONSE.   nitroGLYCERIN 0.4 MG SL tablet Commonly known as: NITROSTAT Place 1 tablet (0.4 mg total) under the tongue every 5 (five) minutes as needed for chest pain.   Omega-3 1000 MG Caps Take by mouth.   oxyCODONE 15 MG immediate release tablet Commonly known as: ROXICODONE TAKE 1 2 TO 1 (ONE HALF TO ONE) TABLET BY MOUTH FOUR TO SIX TIMES DAILY IF TOLERTED NOTE TABLET IS 15 MG   pantoprazole 40 MG tablet Commonly known as: PROTONIX Take 1 tablet by mouth twice daily   pregabalin 50 MG capsule Commonly known as: LYRICA TAKE 1 CAPSULE BY MOUTH THREE TIMES DAILY   sucralfate 1 g tablet Commonly known as: CARAFATE TAKE 1 TABLET BY MOUTH 4 TIMES DAILY... WITH MEALS AND AT BEDTIME   traZODone 100 MG tablet Commonly known as: DESYREL Take 1 tablet (100 mg total) by mouth at bedtime as needed for sleep.   [name] FlexTouch 100 UNIT/ML Sopn FlexTouch Pen Generic drug: insulin degludec Inject 0.24 mLs (24 Units  total) into the skin daily.   triamcinolone cream 0.1 % Commonly known as: KENALOG Apply 1 application topically 2 (two)  times daily.   Trulicity 0.75 MG/0.5ML Sopn Generic drug: Dulaglutide Every two months   vitamin A 78469 UNIT capsule Take 10,000 Units by mouth daily.   vitamin B-12 1000 MCG tablet Commonly known as: CYANOCOBALAMIN Take 1,000 mcg by mouth daily.   vitamin C 1000 MG tablet Take 1,000 mg by mouth daily.   Vitamin D3 125 MCG (5000 UT) Caps Take by mouth.   vitamin E 1000 UNIT capsule Take 1,000 Units by mouth daily.          Outstanding Labs/Studies   None.  Duration of Discharge Encounter   Greater than 30 minutes including physician time.  Signed, Eula Listen, PA-C 09/06/2019, 12:46 PM

## 2019-09-07 ENCOUNTER — Encounter: Payer: Self-pay | Admitting: Nurse Practitioner

## 2019-09-07 ENCOUNTER — Ambulatory Visit (INDEPENDENT_AMBULATORY_CARE_PROVIDER_SITE_OTHER): Payer: Medicare Other | Admitting: Nurse Practitioner

## 2019-09-07 ENCOUNTER — Other Ambulatory Visit: Payer: Self-pay | Admitting: Nurse Practitioner

## 2019-09-07 ENCOUNTER — Other Ambulatory Visit: Payer: Self-pay

## 2019-09-07 VITALS — BP 93/49 | HR 93 | Ht 68.0 in | Wt 230.0 lb

## 2019-09-07 DIAGNOSIS — Z794 Long term (current) use of insulin: Secondary | ICD-10-CM

## 2019-09-07 DIAGNOSIS — E11641 Type 2 diabetes mellitus with hypoglycemia with coma: Secondary | ICD-10-CM | POA: Diagnosis not present

## 2019-09-07 DIAGNOSIS — I25110 Atherosclerotic heart disease of native coronary artery with unstable angina pectoris: Secondary | ICD-10-CM | POA: Diagnosis not present

## 2019-09-07 NOTE — Telephone Encounter (Signed)
Letter to Dr. Primus Bravo faxed for the patient. Called and left patient a VM letting him know that the letter was faxed to Dr. Primus Bravo for him.

## 2019-09-07 NOTE — Assessment & Plan Note (Signed)
Followed by cardiology with improvement reported by patient with addition of Isosorbide and recent cardiac cath procedure.  Continue current medication regimen and collaboration with cardiology.

## 2019-09-07 NOTE — Telephone Encounter (Signed)
Pt calling back after appointment and states that paperwork has still not been faxed over. Pt would like this done as soon as possible so that he can get medication today. Please advise

## 2019-09-07 NOTE — Telephone Encounter (Signed)
Copied from [place] [id number]. Topic: Quick Communication - Rx Refill/Question >> Sep 07, 2019  4:08 PM Rainey Pines A wrote: Medication: Pain medication (Patient stated that pain management is closed and will not be able to provide his medication until next week. Patient stated that he is in a lot of pain and would like pain medication sent to local pharmacy to last him until Monday 11/23)  Has the patient contacted their pharmacy? Yes (Agent: If no, request that the patient contact the pharmacy for the refill.) (Agent: If yes, when and what did the pharmacy advise?)Contact PCP  Preferred Pharmacy (with phone number or street name): [place], [place] [facility] [place] [phone] (Phone) [phone] (Fax)    Agent: Please be advised that RX refills may take up to 3 business days. We ask that you follow-up with your pharmacy.

## 2019-09-07 NOTE — Assessment & Plan Note (Signed)
Chronic, ongoing with poor control and no recent episodes of BS < 70 reported.  Followed by endocrinology.  Continue current medication regimen and collaboration with endocrinology.  Recommend continued focus on diet at home and weight loss.  Return in 2 months.

## 2019-09-07 NOTE — Patient Instructions (Signed)
Carbohydrate Counting for Diabetes Mellitus, Adult  Carbohydrate counting is a method of keeping track of how many carbohydrates you eat. Eating carbohydrates naturally increases the amount of sugar (glucose) in the blood. Counting how many carbohydrates you eat helps keep your blood glucose within normal limits, which helps you manage your diabetes (diabetes mellitus). It is important to know how many carbohydrates you can safely have in each meal. This is different for every person. A diet and nutrition specialist (registered dietitian) can help you make a meal plan and calculate how many carbohydrates you should have at each meal and snack. Carbohydrates are found in the following foods:  Grains, such as breads and cereals.  Dried beans and soy products.  Starchy vegetables, such as potatoes, peas, and corn.  Fruit and fruit juices.  Milk and yogurt.  Sweets and snack foods, such as cake, cookies, candy, chips, and soft drinks. How do I count carbohydrates? There are two ways to count carbohydrates in food. You can use either of the methods or a combination of both. Reading "Nutrition Facts" on packaged food The "Nutrition Facts" list is included on the labels of almost all packaged foods and beverages in the [place] It includes:  The serving size.  Information about nutrients in each serving, including the grams (g) of carbohydrate per serving. To use the "Nutrition Facts":  Decide how many servings you will have.  Multiply the number of servings by the number of carbohydrates per serving.  The resulting number is the total amount of carbohydrates that you will be having. Learning standard serving sizes of other foods When you eat carbohydrate foods that are not packaged or do not include "Nutrition Facts" on the label, you need to measure the servings in order to count the amount of carbohydrates:  Measure the foods that you will eat with a food scale or measuring cup, if needed.   Decide how many standard-size servings you will eat.  Multiply the number of servings by 15. Most carbohydrate-rich foods have about 15 g of carbohydrates per serving. ? For example, if you eat 8 oz (170 g) of strawberries, you will have eaten 2 servings and 30 g of carbohydrates (2 servings x 15 g = 30 g).  For foods that have more than one food mixed, such as soups and casseroles, you must count the carbohydrates in each food that is included. The following list contains standard serving sizes of common carbohydrate-rich foods. Each of these servings has about 15 g of carbohydrates:   hamburger bun or  English muffin.   oz (15 mL) syrup.   oz (14 g) jelly.  1 slice of bread.  1 six-inch tortilla.  3 oz (85 g) cooked rice or pasta.  4 oz (113 g) cooked dried beans.  4 oz (113 g) starchy vegetable, such as peas, corn, or potatoes.  4 oz (113 g) hot cereal.  4 oz (113 g) mashed potatoes or  of a large baked potato.  4 oz (113 g) canned or frozen fruit.  4 oz (120 mL) fruit juice.  4-6 crackers.  6 chicken nuggets.  6 oz (170 g) unsweetened dry cereal.  6 oz (170 g) plain fat-free yogurt or yogurt sweetened with artificial sweeteners.  8 oz (240 mL) milk.  8 oz (170 g) fresh fruit or one small piece of fruit.  24 oz (680 g) popped popcorn. Example of carbohydrate counting Sample meal  3 oz (85 g) chicken breast.  6 oz (170 g)   brown rice.  4 oz (113 g) corn.  8 oz (240 mL) milk.  8 oz (170 g) strawberries with sugar-free whipped topping. Carbohydrate calculation 1. Identify the foods that contain carbohydrates: ? Rice. ? Corn. ? Milk. ? Strawberries. 2. Calculate how many servings you have of each food: ? 2 servings rice. ? 1 serving corn. ? 1 serving milk. ? 1 serving strawberries. 3. Multiply each number of servings by 15 g: ? 2 servings rice x 15 g = 30 g. ? 1 serving corn x 15 g = 15 g. ? 1 serving milk x 15 g = 15 g. ? 1 serving  strawberries x 15 g = 15 g. 4. Add together all of the amounts to find the total grams of carbohydrates eaten: ? 30 g + 15 g + 15 g + 15 g = 75 g of carbohydrates total. Summary  Carbohydrate counting is a method of keeping track of how many carbohydrates you eat.  Eating carbohydrates naturally increases the amount of sugar (glucose) in the blood.  Counting how many carbohydrates you eat helps keep your blood glucose within normal limits, which helps you manage your diabetes.  A diet and nutrition specialist (registered dietitian) can help you make a meal plan and calculate how many carbohydrates you should have at each meal and snack. This information is not intended to replace advice given to you by your health care provider. Make sure you discuss any questions you have with your health care provider. Document Released: 10/05/2005 Document Revised: 04/29/2017 Document Reviewed: 03/18/2016 Elsevier Patient Education  2020 Elsevier Inc.  

## 2019-09-07 NOTE — Progress Notes (Signed)
BP (!) 93/49   Pulse 93   Ht 5\' 8"  (1.727 m)   Wt 230 lb (104.3 kg)   BMI 34.97 kg/m    Subjective:    Patient ID: Edward Schneider., male    DOB: [birth year], 63 y.o.   MRN: [id number]  HPI: Edward Schneider. is a 63 y.o. male  Chief Complaint  Patient presents with  . Hospitalization Follow-up    . This visit was completed via telephone due to the restrictions of the COVID-19 pandemic. All issues as above were discussed and addressed but no physical exam was performed. If it was felt that the patient should be evaluated in the office, they were directed there. The patient verbally consented to this visit. Patient was unable to complete an audio/visual visit due to Lack of equipment. Due to the catastrophic nature of the COVID-19 pandemic, this visit was done through audio contact only. . Location of the patient: home . Location of the provider: home . Those involved with this call:  . Provider: Marnee Guarneri, DNP . CMA: Yvonna Alanis, CMA . Front Desk/Registration: Jill Side  . Time spent on call: 30 minutes on the phone discussing health concerns. 15 minutes total spent in review of patient's record and preparation of their chart.  . I verified patient identity using two factors (patient name and date of birth). Patient consents verbally to being seen via telemedicine visit today.    ER FOLLOW UP Seen in ER on 08/28/2019 for atypical chest pain.  History of angioplasty in 2012.  Seen by Dr. Saunders Revel on 08/30/2019 for cardiology follow-up, as had not been seen in some time, last seen over 5 years ago by cardiologist at [facility] but he did not want to return to them.  Heart cath and coronary angiography on 09/05/2019 for unstable angina, he reports pain and pressure to chest improved after this.  He was also started on Isosorbide mononitrate 30 MG daily by cardiology. He denies any use of NTG prescribed.  Improvement in blood sugars at home, 119 last night and 128 yesterday  morning.  Denies any further blood sugar less than 70.  Has been changing diet.  Cardiology is sending him to classes on diet and cardiac rehab her reports.  He returns to cardiology in 1 1/2 weeks.  Returns to endocrinology at first of year.  Denies any further chest pain, SOB, palpitations, diaphoresis, N&V.  BP on lower side at visit today, but reports average 100-110/70. 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 Mohammed Kindle, his pain provider, stating his procedure is completed and he can continue current pain regimen.    Relevant past medical, surgical, family and social history reviewed and updated as indicated. Interim medical history since our last visit reviewed. Allergies and medications reviewed and updated.  Review of Systems  Constitutional: Negative for activity change, diaphoresis, fatigue and fever.  Respiratory: Negative for cough, chest tightness, shortness of breath and wheezing.   Cardiovascular: Negative for chest pain, palpitations and leg swelling.  Gastrointestinal: Negative for abdominal distention, abdominal pain, constipation, diarrhea, nausea and vomiting.  Endocrine: Negative for cold intolerance, heat intolerance, polydipsia, polyphagia and polyuria.  Neurological: Negative for dizziness, syncope, weakness, light-headedness, numbness and headaches.  Psychiatric/Behavioral: Negative.  Per HPI unless specifically indicated above     Objective:    BP (!) 93/49   Pulse 93   Ht 5\' 8"  (1.727 m)   Wt 230 lb (104.3 kg)   BMI 34.97 kg/m   Wt Readings from Last 3 Encounters:  09/07/19 230 lb (104.3 kg)  09/06/19 233 lb 4.8 oz (105.8 kg)  08/30/19 237 lb 4 oz (107.6 kg)     Physical Exam   Unable to perform due to telephone only visit.  Results for orders placed or performed during the hospital encounter of 09/05/19  Glucose, capillary  Result Value Ref Range   Glucose-Capillary 167 (H) 70 - 99 mg/dL  Glucose, capillary  Result Value Ref Range   Glucose-Capillary 147 (H) 70 - 99 mg/dL  Glucose, capillary  Result Value Ref Range   Glucose-Capillary 140 (H) 70 - 99 mg/dL  Glucose, capillary  Result Value Ref Range   Glucose-Capillary 156 (H) 70 - 99 mg/dL  Basic metabolic panel  Result Value Ref Range   Sodium 139 135 - 145 mmol/L   Potassium 4.2 3.5 - 5.1 mmol/L   Chloride 101 98 - 111 mmol/L   CO2 26 22 - 32 mmol/L   Glucose, Bld 129 (H) 70 - 99 mg/dL   BUN 15 8 - 23 mg/dL   Creatinine, Ser 1.04 0.61 - 1.24 mg/dL   Calcium 9.2 8.9 - 10.3 mg/dL   GFR calc non Af Amer >60 >60 mL/min   GFR calc Af Amer >60 >60 mL/min   Anion gap 12 5 - 15  CBC  Result Value Ref Range   WBC 8.9 4.0 - 10.5 K/uL   RBC 3.92 (L) 4.22 - 5.81 MIL/uL   Hemoglobin 10.7 (L) 13.0 - 17.0 g/dL   HCT 33.5 (L) 39.0 - 52.0 %   MCV 85.5 80.0 - 100.0 fL   MCH 27.3 26.0 - 34.0 pg   MCHC 31.9 30.0 - 36.0 g/dL   RDW 13.3 11.5 - 15.5 %   Platelets 175 150 - 400 K/uL   nRBC 0.0 0.0 - 0.2 %  Glucose, capillary  Result Value Ref Range   Glucose-Capillary 97 70 - 99 mg/dL   Comment 1 Notify RN   Glucose, capillary  Result Value Ref Range   Glucose-Capillary 98 70 - 99 mg/dL  Glucose, capillary  Result Value Ref Range   Glucose-Capillary 116 (H) 70 - 99 mg/dL  Glucose, capillary  Result Value Ref Range   Glucose-Capillary 110 (H) 70 - 99 mg/dL  POCT Activated clotting time  Result Value Ref Range   Activated Clotting Time 246 seconds  POCT Activated clotting time  Result Value Ref Range   Activated Clotting Time 279 seconds  POCT Activated clotting time  Result Value Ref Range   Activated Clotting Time 290 seconds      Assessment & Plan:   Problem List Items  Addressed This Visit      Cardiovascular and Mediastinum   Coronary artery disease involving native coronary artery of native heart with unstable angina pectoris ([place]) - Primary    Followed by cardiology with improvement reported by patient with addition of Isosorbide and recent cardiac cath procedure.  Continue current medication regimen and collaboration with cardiology.          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  Followed by endocrinology.  Continue current medication regimen and collaboration with endocrinology.  Recommend continued focus on diet at home and weight loss.  Return in 2 months.          Follow up plan: Return in about 8 weeks (around 11/02/2019) for T2DM, HTN/HLD, Angina, COPD, GERD, Chronic pain.

## 2019-09-08 ENCOUNTER — Telehealth: Payer: Self-pay | Admitting: Internal Medicine

## 2019-09-08 ENCOUNTER — Other Ambulatory Visit: Payer: Self-pay | Admitting: Nurse Practitioner

## 2019-09-08 MED ORDER — OXYCODONE HCL 15 MG PO TABS: ORAL_TABLET | ORAL | 0 refills | Status: DC

## 2019-09-08 NOTE — Telephone Encounter (Signed)
Patient notified and verbalized understanding. 

## 2019-09-08 NOTE — Telephone Encounter (Addendum)
DPR on file. Patient sts his name on his voicemail with an ok to leave a detailed message.  Lmom. Unfortunately we (cardiology) will not write prescriptions for pain medication. Recommend contacting his pcp or pain physicians office again for assistance.  Patient is to call back if any questions.

## 2019-09-08 NOTE — Telephone Encounter (Signed)
Patient calling to see if Dr. Saunders Revel will call in pain medicine after his recent hospital visit.    Patient attempted to get a rx from pain clinic and pcp office .  Both declined per patient .  ( see note in epic pcp office for refill)   Patient states he is having a lot of pain.

## 2019-09-08 NOTE — Telephone Encounter (Signed)
Routing to provider  

## 2019-09-08 NOTE — Telephone Encounter (Signed)
I did call Dr. Primus Bravo office and note they do not appear to have call system and are closed today, but patient can leave a detailed message with his name, number.  In PMP it appears his last fill was on 08/07/2019 Oxycodone 15 MG tablets, he received #180 tablet, a 30 day supply.  This means he should have run out of medication yesterday.  On medication list it states this is ordered 1 tablet 4 to 6 times daily as needed and tolerated.    I do request he call Dr. Primus Bravo office and leave a detailed message to alert them to his needs so they are aware on Monday.  Due to this being a controlled substance ordered by his pain medicine provider I will only provide 9 tablets, he will need to use sparingly until Monday when he can further discuss with Dr. Primus Bravo.  For any further refills he will need to discuss with his pain medicine provider to ensure he keeps current controlled substance contract between them intact. Thank you.

## 2019-09-08 NOTE — Telephone Encounter (Signed)
I do not prescribe chronic pain medications.  If he is having angina or cath site pain, we should arrange for him to come in to discuss this further.  Otherwise, he will need to speak with his PCP and/or pain specialist again.  Nelva Bush, MD [facility] Pager: [phone]

## 2019-09-08 NOTE — Telephone Encounter (Signed)
MEDICATION: Novolog, Tresiba, diabetic supplies  PHARMACY:  [facility]   IS THIS A 90 DAY SUPPLY : yes  IS PATIENT OUT OF MEDICATION: no  IF NOT; HOW MUCH IS LEFT: 7-10 days  LAST APPOINTMENT DATE: @10 /16/2020  NEXT APPOINTMENT DATE:@1 /04/2020  DO WE HAVE YOUR PERMISSION TO LEAVE A DETAILED MESSAGE: yes  OTHER COMMENTS: patient states that his medication & supplies are covered by [employer] patient assistance and are shipped here   **Let patient know to contact pharmacy at the end of the day to make sure medication is ready. **  ** Please notify patient to allow 48-72 hours to process**  **Encourage patient to contact the pharmacy for refills or they can request refills through [facility]**

## 2019-09-11 ENCOUNTER — Inpatient Hospital Stay: Payer: Medicare Other | Admitting: Nurse Practitioner

## 2019-09-11 ENCOUNTER — Telehealth: Payer: Self-pay | Admitting: Family Medicine

## 2019-09-11 NOTE — Telephone Encounter (Signed)
Refills were faxed to [facility] 07/26/2019 with conformation received, lft vm for pt with this info and re-faxed previous paperwork.

## 2019-09-11 NOTE — Telephone Encounter (Signed)
Note made in error disregard sorry!

## 2019-09-12 ENCOUNTER — Ambulatory Visit: Admit: 2019-09-12 | Payer: Medicare Other | Admitting: Gastroenterology

## 2019-09-12 SURGERY — COLONOSCOPY WITH PROPOFOL
Anesthesia: General
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
## 2019-09-19 ENCOUNTER — Other Ambulatory Visit: Payer: Self-pay

## 2019-09-19 ENCOUNTER — Encounter: Payer: Self-pay | Admitting: Physician Assistant

## 2019-09-19 ENCOUNTER — Ambulatory Visit (INDEPENDENT_AMBULATORY_CARE_PROVIDER_SITE_OTHER): Payer: Medicare Other | Admitting: Physician Assistant

## 2019-09-19 VITALS — BP 120/62 | HR 77 | Ht 68.0 in | Wt 236.0 lb

## 2019-09-19 DIAGNOSIS — E785 Hyperlipidemia, unspecified: Secondary | ICD-10-CM | POA: Diagnosis not present

## 2019-09-19 DIAGNOSIS — E11641 Type 2 diabetes mellitus with hypoglycemia with coma: Secondary | ICD-10-CM

## 2019-09-19 DIAGNOSIS — I10 Essential (primary) hypertension: Secondary | ICD-10-CM | POA: Diagnosis not present

## 2019-09-19 DIAGNOSIS — I25118 Atherosclerotic heart disease of native coronary artery with other forms of angina pectoris: Secondary | ICD-10-CM

## 2019-09-19 DIAGNOSIS — Z794 Long term (current) use of insulin: Secondary | ICD-10-CM

## 2019-09-19 DIAGNOSIS — Z79899 Other long term (current) drug therapy: Secondary | ICD-10-CM

## 2019-09-19 MED ORDER — ISOSORBIDE MONONITRATE ER 60 MG PO TB24: 60.0000 mg | ORAL_TABLET | Freq: Every day | ORAL | 1 refills | Status: DC

## 2019-09-19 NOTE — Patient Instructions (Signed)
Medication Instructions:  - Your physician has recommended you make the following change in your medication:   1) Increase imdur (isosorbide) to 60 mg- take 1 tablet by mouth once daily  *If you need a refill on your cardiac medications before your next appointment, please call your pharmacy*  Lab Work: - Your physician recommends that you return for FASTING lab work in: 4 weeks (around the first week of January)- lipid/ liver [place] entrance, 1st desk on the right to check in Lab hours: Monday- Friday (7:30 am- 5:30 pm)  No food/ drink for 8 hours prior to your lab draw except for water/ black coffee  If you have labs (blood work) drawn today and your tests are completely normal, you will receive your results only by: Marland Kitchen MyChart Message (if you have MyChart) OR . A paper copy in the mail If you have any lab test that is abnormal or we need to change your treatment, we will call you to review the results.  Testing/Procedures: - none ordered  Follow-Up: At [facility], you and your health needs are our priority.  As part of our continuing mission to provide you with exceptional heart care, we have created designated Provider Care Teams.  These Care Teams include your primary Cardiologist (physician) and Advanced Practice Providers (APPs -  Physician Assistants and Nurse Practitioners) who all work together to provide you with the care you need, when you need it.  Your next appointment:   5-6 week(s)  The format for your next appointment:   In Person  Provider:   Nelva Bush, MD  Other Instructions n/a

## 2019-09-21 ENCOUNTER — Encounter: Payer: Medicare Other | Attending: Internal Medicine

## 2019-09-26 ENCOUNTER — Ambulatory Visit (INDEPENDENT_AMBULATORY_CARE_PROVIDER_SITE_OTHER): Payer: Medicare Other | Admitting: Pharmacist

## 2019-09-26 DIAGNOSIS — Z794 Long term (current) use of insulin: Secondary | ICD-10-CM

## 2019-09-26 DIAGNOSIS — E11641 Type 2 diabetes mellitus with hypoglycemia with coma: Secondary | ICD-10-CM

## 2019-09-26 DIAGNOSIS — I25110 Atherosclerotic heart disease of native coronary artery with unstable angina pectoris: Secondary | ICD-10-CM

## 2019-09-26 NOTE — Patient Instructions (Signed)
Visit Information  Goals Addressed            This Visit's Progress     Patient Stated   . PharmD "I want to stay healthy" (pt-stated)       Current Barriers:  . Diabetes: uncontrolled, complicated by chronic medical conditions including atherosclerosis (s/p DES to LAD on 11/17), most recent A1c 13.7%; follows w/ Dr. Kelton Pillar @ [facility] . Current antihyperglycemic regimen: Tresiba 24 units daily, Novolog 13 units TID with meals (taking AFTER meals); Ozempic 0.25 mg once weekly - notes he has been on this dose for at least 4 weeks o Receives all three medications from [employer] patient assistance through 10/19/2019 o Hx GI intolerance to metformin . Denies episodes of hypoglycemia recently . Reports continued polyuria, polydipsia . Current blood glucose readings: remaining in 100-170s; infrequent excursions to 200s . Cardiovascular risk reduction- s/p DES on 09/03/2019; follows w/ Dr. Saunders Revel  o Current hypertensive regimen: benazepril 10 mg daily, isosorbide 60 mg daily (recently increased by cards); no beta blocker d/t bradycardia  o Current hyperlipidemia regimen: atorvastatin 40 mg; patient has not had f/u lipid since this medication was started; labs scheduled by cards in January, and plan to increase to 80 mg if LDL is not at goal <70 o Current antiplatelet regimen: ASA 81 mg daily, clopidogrel 75 mg daily; plan for DAPT x 12 months, then clopidogrel monotherapy indefinitely. . Chronic pain- follows w/ Dr. Primus Bravo o Doxepin 25 mg BID, duloxetine 30 mg QAM, 30 mg Qafternoon, 60 mg QPM; pregabalin 50 mg TID, oxycodone IR 15 mg Q4H; cyclobenzaprine 10 mg TID, diclofenac gel PRN,   Pharmacist Clinical Goal(s):  Marland Kitchen Over the next 90 days, patient will work with PharmD and primary care provider to address optimized medication management  Interventions: . Comprehensive medication review performed, medication list updated in electronic medical record . Reviewed goal A1c, goal fasting  glucose, goal post prandial glucose . Discussed process for [employer] re-application for [id number]. Will collaborate w/ patient, Susy Frizzle, and endocrinology for this in January . Encouraged patient to increase Ozempic to 0.5 mg as per prescription with his next injection. He notes he will do so on Friday. I encouraged him to contact me or Dr. Quin Hoop office with any development of hypoglycemia w/ this dose increase.  . Patient also taking ginger and ginseng, which may increase risk of bleeding in combination w/ DAPT. Recommended if he notices that he bleeds more easily/clots less easily to d/c ginger and gingseng . Discussed risks of daily diphenhydramine therapy. Recommended he purchase OTC second generation antihistamine (loratadine, cetirizine) and take daily instead for allergies   Patient Self Care Activities:  . Patient will check blood glucose BID , document, and provide at future appointments . Patient will take medications as prescribed . Patient will contact provider with any episodes of hypoglycemia . Patient will report any questions or concerns to provider   Please see past updates related to this goal by clicking on the "Past Updates" button in the selected goal         The patient verbalized understanding of instructions provided today and declined a print copy of patient instruction materials.   Plan: - Will outreach patient in ~4 weeks for medication management review and medication access support  Catie Darnelle Maffucci, PharmD, [place] [phone]

## 2019-09-26 NOTE — Chronic Care Management (AMB) (Signed)
Chronic Care Management   Follow Up Note   09/26/2019 Name: Lorenzo Arscott. MRN: [id number] DOB: Apr 17, 1956  Referred by: Guadalupe Maple, MD Reason for referral : Chronic Care Management (Medication Management)   Azel Gumina. is a 62 y.o. year old male who is a primary care patient of Crissman, Jeannette How, MD. The CCM team was consulted for assistance with chronic disease management and care coordination needs.    Contacted patient for medication management review.   Review of patient status, including review of consultants reports, relevant laboratory and other test results, and collaboration with appropriate care team members and the patient's provider was performed as part of comprehensive patient evaluation and provision of chronic care management services.    SDOH (Social Determinants of Health) screening performed today: Financial Strain . See Care Plan for related entries.   Outpatient Encounter Medications as of 09/26/2019  Medication Sig Note  . ACCU-CHEK AVIVA PLUS test strip USE 1 STRIP TO CHECK GLUCOSE ONCE DAILY   . Accu-Chek Softclix Lancets lancets USE 1  TO CHECK GLUCOSE ONCE DAILY   . albuterol (VENTOLIN HFA) 108 (90 Base) MCG/ACT inhaler Inhale 2 puffs into the lungs every 6 (six) hours as needed for wheezing or shortness of breath. 09/26/2019: Taking 1-3 times per day  . Ascorbic Acid (VITAMIN C) 1000 MG tablet Take 1,000 mg by mouth daily.   Marland Kitchen aspirin EC 81 MG EC tablet Take 1 tablet (81 mg total) by mouth daily.   Marland Kitchen atorvastatin (LIPITOR) 40 MG tablet Take 1 tablet (40 mg total) by mouth daily.   . benazepril (LOTENSIN) 10 MG tablet Take 1 tablet (10 mg total) by mouth daily.   . Cholecalciferol (VITAMIN D3) 125 MCG (5000 UT) CAPS Take 1 capsule by mouth daily.    Marland Kitchen CINNAMON PO Take 1,000 mg by mouth 2 (two) times daily.    . clopidogrel (PLAVIX) 75 MG tablet Take 1 tablet (75 mg total) by mouth daily with breakfast.   . cyclobenzaprine (FLEXERIL) 10 MG tablet  Take 1 tablet (10 mg total) by mouth 3 (three) times daily as needed for muscle spasms.   . diclofenac sodium (VOLTAREN) 1 % GEL Apply 2 g topically 4 (four) times daily.   . diphenhydrAMINE (BENADRYL) 25 mg capsule Take 25 mg by mouth 2 (two) times daily.    Marland Kitchen doxepin (SINEQUAN) 25 MG capsule Take 50 mg by mouth at bedtime. 09/26/2019: Taking BID  . DULoxetine (CYMBALTA) 30 MG capsule LIMIT 1 2 CAPSULES BY MOUTH PER DAY OF TOLERATED 09/26/2019: 30 mg QAM, 30 mg mid-day, 60 mg HS  . DULoxetine (CYMBALTA) 60 MG capsule Take 1 capsule (60 mg total) by mouth daily.   . Ginger, Zingiber officinalis, (GINGER PO) Take 1 Dose by mouth daily.   . Ginseng 100 MG CAPS Take by mouth.   . insulin aspart (NOVOLOG) 100 UNIT/ML FlexPen Inject 14 Units into the skin 3 (three) times daily with meals. 09/26/2019: 13 units  . insulin degludec (TRESIBA FLEXTOUCH) 100 UNIT/ML SOPN FlexTouch Pen Inject 0.24 mLs (24 Units total) into the skin daily.   . Insulin Pen Needle (B-D UF III MINI PEN NEEDLES) 31G X 5 MM MISC Daily   . isosorbide mononitrate (IMDUR) 60 MG 24 hr tablet Take 1 tablet (60 mg total) by mouth daily.   . mupirocin ointment (BACTROBAN) 2 % APPLY OINTMENT TOPICALLY TWICE DAILY   . nitroGLYCERIN (NITROSTAT) 0.4 MG SL tablet Place 1 tablet (0.4 mg total)  under the tongue every 5 (five) minutes as needed for chest pain.   . Omega-3 1000 MG CAPS Take 1,000 mg by mouth 2 (two) times daily.    Marland Kitchen oxyCODONE (ROXICODONE) 15 MG immediate release tablet TAKE 1/2 TO 1 (ONE HALF TO ONE) TABLET BY MOUTH FOUR TO SIX TIMES DAILY IF TOLERATED NOTE TABLET IS 15 MG 09/26/2019: Taking 6 tab per day  . pantoprazole (PROTONIX) 40 MG tablet Take 1 tablet by mouth twice daily   . pregabalin (LYRICA) 50 MG capsule TAKE 1 CAPSULE BY MOUTH THREE TIMES DAILY   . Semaglutide,0.25 or 0.5MG/DOS, (OZEMPIC, 0.25 OR 0.5 MG/DOSE,) 2 MG/1.5ML SOPN Inject 0.5 mg into the skin once a week. 09/26/2019: Taking 0.25, will increase on Friday  .  sucralfate (CARAFATE) 1 g tablet TAKE 1 TABLET BY MOUTH 4 TIMES DAILY... WITH MEALS AND AT BEDTIME   . traZODone (DESYREL) 100 MG tablet Take 1 tablet (100 mg total) by mouth at bedtime as needed for sleep.   Marland Kitchen triamcinolone cream (KENALOG) 0.1 % Apply 1 application topically 2 (two) times daily.   Marland Kitchen umeclidinium-vilanterol (ANORO ELLIPTA) 62.5-25 MCG/INH AEPB Inhale 1 puff into the lungs daily.   . vitamin A 10000 UNIT capsule Take 10,000 Units by mouth daily.   . vitamin B-12 (CYANOCOBALAMIN) 1000 MCG tablet Take 1,000 mcg by mouth daily.   . vitamin E 1000 UNIT capsule Take 1,000 Units by mouth daily.   . [DISCONTINUED] cephALEXin (KEFLEX) 500 MG capsule Take 500 mg by mouth 2 (two) times daily.   Marland Kitchen EPINEPHrine 0.3 mg/0.3 mL IJ SOAJ injection Inject 0.3 mLs (0.3 mg total) into the muscle as needed for anaphylaxis. (Patient not taking: Reported on 09/26/2019)   . NARCAN 4 MG/0.1ML LIQD nasal spray kit CALL 911. ADMINISTER A SINGLE SPRAY OF NARCAN IN ONE NOSTRIL. REPEAT EVERY 3 MINUTES AS NEEDED IF NO OR MINIMAL RESPONSE.   . [DISCONTINUED] diphenoxylate-atropine (LOMOTIL) 2.5-0.025 MG tablet Take 1 tablet by mouth 4 (four) times daily as needed for diarrhea or loose stools. (Patient not taking: Reported on 09/26/2019)   . [DISCONTINUED] EPINEPHrine (EPIPEN IJ) Inject as directed.   . [DISCONTINUED] TRULICITY 7.56 EP/3.2RJ SOPN Every two months    No facility-administered encounter medications on file as of 09/26/2019.      Goals Addressed            This Visit's Progress     Patient Stated   . PharmD "I want to stay healthy" (pt-stated)       Current Barriers:  . Diabetes: uncontrolled, complicated by chronic medical conditions including atherosclerosis (s/p DES to LAD on 11/17), most recent A1c 13.7%; follows w/ Dr. Kelton Pillar @ [facility] . Current antihyperglycemic regimen: Tresiba 24 units daily, Novolog 13 units TID with meals (taking AFTER meals); Ozempic 0.25 mg once weekly -  notes he has been on this dose for at least 4 weeks o Receives all three medications from [employer] patient assistance through 10/19/2019 o Hx GI intolerance to metformin . Denies episodes of hypoglycemia recently . Reports continued polyuria, polydipsia . Current blood glucose readings: remaining in 100-170s; infrequent excursions to 200s . Cardiovascular risk reduction- s/p DES on 09/03/2019; follows w/ Dr. Saunders Revel  o Current hypertensive regimen: benazepril 10 mg daily, isosorbide 60 mg daily (recently increased by cards); no beta blocker d/t bradycardia  o Current hyperlipidemia regimen: atorvastatin 40 mg; patient has not had f/u lipid since this medication was started; labs scheduled by cards in January, and plan  to increase to 80 mg if LDL is not at goal <70 o Current antiplatelet regimen: ASA 81 mg daily, clopidogrel 75 mg daily; plan for DAPT x 12 months, then clopidogrel monotherapy indefinitely. . Chronic pain- follows w/ Dr. Primus Bravo o Doxepin 25 mg BID, duloxetine 30 mg QAM, 30 mg Qafternoon, 60 mg QPM; pregabalin 50 mg TID, oxycodone IR 15 mg Q4H; cyclobenzaprine 10 mg TID, diclofenac gel PRN,   Pharmacist Clinical Goal(s):  Marland Kitchen Over the next 90 days, patient will work with PharmD and primary care provider to address optimized medication management  Interventions: . Comprehensive medication review performed, medication list updated in electronic medical record . Reviewed goal A1c, goal fasting glucose, goal post prandial glucose . Discussed process for [employer] re-application for 7282. Will collaborate w/ patient, Susy Frizzle, and endocrinology for this in January . Encouraged patient to increase Ozempic to 0.5 mg as per prescription with his next injection. He notes he will do so on Friday. I encouraged him to contact me or Dr. Quin Hoop office with any development of hypoglycemia w/ this dose increase.  . Patient also taking ginger and ginseng, which may increase risk of  bleeding in combination w/ DAPT. Recommended if he notices that he bleeds more easily/clots less easily to d/c ginger and gingseng . Discussed risks of daily diphenhydramine therapy. Recommended he purchase OTC second generation antihistamine (loratadine, cetirizine) and take daily instead for allergies   Patient Self Care Activities:  . Patient will check blood glucose BID , document, and provide at future appointments . Patient will take medications as prescribed . Patient will contact provider with any episodes of hypoglycemia . Patient will report any questions or concerns to provider   Please see past updates related to this goal by clicking on the "Past Updates" button in the selected goal          Plan: - Will outreach patient in ~4 weeks for medication management review and medication access support  Catie Darnelle Maffucci, PharmD, [place] [phone]

## 2019-10-02 DIAGNOSIS — G894 Chronic pain syndrome: Secondary | ICD-10-CM | POA: Diagnosis not present

## 2019-10-16 ENCOUNTER — Telehealth: Payer: Self-pay | Admitting: Gastroenterology

## 2019-10-16 NOTE — Telephone Encounter (Signed)
LVM for pt to return my call.

## 2019-10-16 NOTE — Telephone Encounter (Signed)
Patient called & l/m on v/m stating he needs to schedule a colonoscopy with Dr Allen Norris. He was scheduled & had a heart attack & surgery.

## 2019-10-18 ENCOUNTER — Telehealth: Payer: Self-pay | Admitting: Internal Medicine

## 2019-10-18 NOTE — Telephone Encounter (Signed)
I agree with plan, as outlined in Ms. Allen's note.  He should f/u as planned with me next month and go the ED in the meantime if he has recurrent chest pain.  Nelva Bush, MD [facility]

## 2019-10-18 NOTE — Telephone Encounter (Signed)
Patients wife identified herself stating that her husband has been having chest pain and she is worried. She requested that I please speak with him because he would not call. Spoke with patient he stated "it was nothing but gas". He reports that last night he was bloated and having gas pains and some pain in his chest. He took 2 Gas X pills and after that his discomfort to include chest pain went away. Reviewed signs and symptoms of chest pain which would require immediate evaluation in the ED. He states that last time he was "popped on the hand" for taking 4 nitro pills and not going to ED. Inquired if he has been taking nitro and he said that he has not taken anymore since the time he took 4 pills. Reviewed that if he taking that frequently then he needs to make [place] aware of that. He verbalized understanding and states that his chest pain went away once he took the Gas X. Confirmed upcoming appointment with him and again reviewed strict instructions of signs and symptoms that he should go to ED for. He verbalized understanding of our conversation, agreement with plan, and hand no further questions at this time.

## 2019-10-18 NOTE — Telephone Encounter (Signed)
Left vm again today regarding message to schedule colonoscopy.

## 2019-10-18 NOTE — Telephone Encounter (Signed)
Pt c/o of Chest Pain: STAT if CP now or developed within 24 hours  1. Are you having CP right now? Last night was complaining about it to wife   2. Are you experiencing any other symptoms (ex. SOB, nausea, vomiting, sweating)? SOB  3. How long have you been experiencing CP? Off and on for some time  4. Is your CP continuous or coming and going? Coming and going   5. Have you taken Nitroglycerin? Yesterday  ?

## 2019-10-19 ENCOUNTER — Ambulatory Visit: Payer: Medicare Other | Admitting: Podiatry

## 2019-10-19 ENCOUNTER — Other Ambulatory Visit: Payer: Self-pay

## 2019-10-19 ENCOUNTER — Encounter: Payer: Self-pay | Admitting: Podiatry

## 2019-10-19 DIAGNOSIS — M79675 Pain in left toe(s): Secondary | ICD-10-CM

## 2019-10-19 DIAGNOSIS — M79674 Pain in right toe(s): Secondary | ICD-10-CM | POA: Diagnosis not present

## 2019-10-19 DIAGNOSIS — B351 Tinea unguium: Secondary | ICD-10-CM

## 2019-10-19 DIAGNOSIS — E1142 Type 2 diabetes mellitus with diabetic polyneuropathy: Secondary | ICD-10-CM

## 2019-10-19 NOTE — Progress Notes (Signed)
Complaint:  Visit Type: Patient returns to my office for continued preventative foot care services. Complaint: Patient states" my nails have grown long and thick and become painful to walk and wear shoes" Patient has been diagnosed with DM with neuropathy.   The patient presents for preventative foot care services..  Patient says he has developed a skin fissure on back of right heel.  Podiatric Exam: Vascular: dorsalis pedis and posterior tibial pulses are palpable bilateral. Capillary return is immediate. Temperature gradient is WNL. Skin turgor WNL  Sensorium: Absent  Semmes Weinstein monofilament test. Absent tactile sensation bilaterally. Nail Exam: Pt has thick disfigured discolored nails with subungual debris noted bilateral entire nail hallux through fifth toenails Ulcer Exam: There is no evidence of ulcer or pre-ulcerative changes or infection. Orthopedic Exam: Muscle tone and strength are WNL. No limitations in general ROM. No crepitus or effusions noted. Foot type and digits show no abnormalities. Bony prominences are unremarkable. Skin: No Porokeratosis. No infection or ulcers  Diagnosis:  Onychomycosis, , Pain in right toe, pain in left toes  Treatment & Plan Procedures and Treatment: Consent by patient was obtained for treatment procedures.   Debridement of mycotic and hypertrophic toenails, 1 through 5 bilateral and clearing of subungual debris. No ulceration, no infection noted. Told him to continue with chapstick and vaseline. Return Visit-Office Procedure: Patient instructed to return to the office for a follow up visit 10 weeks  for continued evaluation and treatment.    Gardiner Barefoot DPM

## 2019-10-23 ENCOUNTER — Telehealth: Payer: Self-pay | Admitting: Family Medicine

## 2019-10-23 NOTE — Telephone Encounter (Signed)
Copied from [place] [id number]. Topic: Appointment Scheduling - Scheduling Inquiry for Clinic >> Oct 23, 2019  1:46 PM Lennox Solders wrote: Reason for CRM: pt is having right arm and right leg pain this started  dec and he feels like arm and leg is getting ready to give out on him. Pt had heart surgery in nov 2020. Pt said he feels like something is torn in his right arm and right leg the pain is getting worst each day. Pt does not want virtual appt. Pt is using a cane to get around >> Oct 23, 2019  4:55 PM Georgina Peer, CMA wrote: Patient has appointment with Dr. Wynetta Emery tomorrow, routing to her as an FYI.

## 2019-10-23 NOTE — Telephone Encounter (Signed)
Copied from [place] [id number]. Topic: Appointment Scheduling - Scheduling Inquiry for Clinic >> Oct 23, 2019  1:46 PM Lennox Solders wrote: Reason for CRM: pt is having right arm and right leg pain this started  dec and he feels like arm and leg is getting ready to give out on him. Pt had heart surgery in nov 2020. Pt said he feels like something is torn in his right arm and right leg the pain is getting worst each day. Pt does not want virtual appt. Pt is using a cane to get around >> Oct 23, 2019  4:55 PM Georgina Peer, CMA wrote: Patient has appointment with Dr. Wynetta Emery tomorrow, routing to her as an FYI.

## 2019-10-24 ENCOUNTER — Ambulatory Visit
Admission: RE | Admit: 2019-10-24 | Discharge: 2019-10-24 | Disposition: A | Discharge status: Home / Self Care | Payer: Medicare Other | Source: Ambulatory Visit | Attending: Family Medicine | Admitting: Family Medicine

## 2019-10-24 ENCOUNTER — Other Ambulatory Visit: Payer: Self-pay

## 2019-10-24 ENCOUNTER — Encounter: Payer: Self-pay | Admitting: Family Medicine

## 2019-10-24 ENCOUNTER — Telehealth: Payer: Self-pay | Admitting: Family Medicine

## 2019-10-24 ENCOUNTER — Ambulatory Visit (INDEPENDENT_AMBULATORY_CARE_PROVIDER_SITE_OTHER): Payer: Medicare Other | Admitting: Family Medicine

## 2019-10-24 VITALS — BP 118/72 | HR 88 | Temp 98.3°F

## 2019-10-24 DIAGNOSIS — M4326 Fusion of spine, lumbar region: Secondary | ICD-10-CM | POA: Diagnosis not present

## 2019-10-24 DIAGNOSIS — R600 Localized edema: Secondary | ICD-10-CM | POA: Diagnosis not present

## 2019-10-24 DIAGNOSIS — M5410 Radiculopathy, site unspecified: Secondary | ICD-10-CM

## 2019-10-24 DIAGNOSIS — M79601 Pain in right arm: Secondary | ICD-10-CM

## 2019-10-24 MED ORDER — PREDNISONE 10 MG PO TABS: ORAL_TABLET | ORAL | 0 refills | Status: DC

## 2019-10-24 NOTE — Telephone Encounter (Signed)
Copied from [place] [id number]. Topic: Appointment Scheduling - Scheduling Inquiry for Clinic >> Oct 23, 2019  1:46 PM Lennox Solders wrote: Reason for CRM: pt is having right arm and right leg pain this started  dec and he feels like arm and leg is getting ready to give out on him. Pt had heart surgery in nov 2020. Pt said he feels like something is torn in his right arm and right leg the pain is getting worst each day. Pt does not want virtual appt. Pt is using a cane to get around >> Oct 23, 2019  4:55 PM Georgina Peer, CMA wrote: Patient has appointment with Dr. Wynetta Emery tomorrow, routing to her as an FYI.

## 2019-10-24 NOTE — Telephone Encounter (Signed)
Called patient to let him know that his [place] was negative for DVT. ?irriatition and swelling- no hematoma seen on [place] either. Will treat with prednisone taper for both back and arm. If not better in 2 weeks consider additional imaging.

## 2019-10-24 NOTE — Progress Notes (Signed)
BP 118/72   Pulse 88   Temp 98.3 F (36.8 C)   SpO2 96%    Subjective:    Patient ID: Edward Schneider., male    DOB: [birth year], 64 y.o.   MRN: [id number]  HPI: Edward Letter. is a 64 y.o. male  Chief Complaint  Patient presents with  . Arm Pain    right  . Leg Pain    right   Has been having pain in his R leg. Notes that he feels like his leg wants to go forward. Leg has been hurting for about 3 weeks. Didn't do anything to it. Got out of a chair. Feels like it's burning and throbbing. Feels like something was torn behind his knee. He has had issues with his back, but has never had any problems like this previously. Pain is from hip to his toes and hurts the worst behind his knee. He has a pain contract with Dr. Primus Bravo and this does not seem to be touching the pain.   Arm started hurting immediatly after his cardiac cath almost 2 months ago. He notes that it was severely bruised. Seems to be getting worse in terms of pain- has been having more numbness and tingling and cannot lift his arm to do things. He does note that the bruising has improved. Pain is shooting and directly in the bicep. Nothing makes it better and moving it and lifting it makes it worse. Pain does not radiate. He is otherwise doing OK with no other concerns or complaints at this time.   Relevant past medical, surgical, family and social history reviewed and updated as indicated. Interim medical history since our last visit reviewed. Allergies and medications reviewed and updated.  Review of Systems  Constitutional: Negative.   Respiratory: Negative.   Cardiovascular: Negative.   Gastrointestinal: Negative.   Musculoskeletal: Positive for arthralgias and myalgias. Negative for back pain, gait problem, joint swelling, neck pain and neck stiffness.  Skin: Negative.   Neurological: Positive for weakness and numbness. Negative for dizziness, tremors, seizures, syncope, facial asymmetry, speech difficulty,  light-headedness and headaches.  Psychiatric/Behavioral: Negative.     Per HPI unless specifically indicated above     Objective:    BP 118/72   Pulse 88   Temp 98.3 F (36.8 C)   SpO2 96%   Wt Readings from Last 3 Encounters:  09/19/19 236 lb (107 kg)  09/07/19 230 lb (104.3 kg)  09/06/19 233 lb 4.8 oz (105.8 kg)    Physical Exam Vitals and nursing note reviewed.  Constitutional:      General: He is not in acute distress.    Appearance: Normal appearance. He is not ill-appearing, toxic-appearing or diaphoretic.  HENT:     Head: Normocephalic and atraumatic.     Right Ear: External ear normal.     Left Ear: External ear normal.     Nose: Nose normal.     Mouth/Throat:     Mouth: Mucous membranes are moist.     Pharynx: Oropharynx is clear.  Eyes:     General: No scleral icterus.       Right eye: No discharge.        Left eye: No discharge.     Extraocular Movements: Extraocular movements intact.     Conjunctiva/sclera: Conjunctivae normal.     Pupils: Pupils are equal, round, and reactive to light.  Cardiovascular:     Rate and Rhythm: Normal rate and regular rhythm.  Pulses: Normal pulses.     Heart sounds: Normal heart sounds. No murmur. No friction rub. No gallop.   Pulmonary:     Effort: Pulmonary effort is normal. No respiratory distress.     Breath sounds: Normal breath sounds. No stridor. No wheezing, rhonchi or rales.  Chest:     Chest wall: No tenderness.  Musculoskeletal:        General: Normal range of motion.     Cervical back: Normal range of motion and neck supple.  Skin:    General: Skin is warm and dry.     Capillary Refill: Capillary refill takes less than 2 seconds.     Coloration: Skin is not jaundiced or pale.     Findings: No bruising, erythema, lesion or rash.  Neurological:     General: No focal deficit present.     Mental Status: He is alert and oriented to person, place, and time. Mental status is at baseline.  Psychiatric:         Mood and Affect: Mood normal.        Behavior: Behavior normal.        Thought Content: Thought content normal.        Judgment: Judgment normal.     Results for orders placed or performed during the hospital encounter of 09/05/19  Glucose, capillary  Result Value Ref Range   Glucose-Capillary 167 (H) 70 - 99 mg/dL  Glucose, capillary  Result Value Ref Range   Glucose-Capillary 147 (H) 70 - 99 mg/dL  Glucose, capillary  Result Value Ref Range   Glucose-Capillary 140 (H) 70 - 99 mg/dL  Glucose, capillary  Result Value Ref Range   Glucose-Capillary 156 (H) 70 - 99 mg/dL  Basic metabolic panel  Result Value Ref Range   Sodium 139 135 - 145 mmol/L   Potassium 4.2 3.5 - 5.1 mmol/L   Chloride 101 98 - 111 mmol/L   CO2 26 22 - 32 mmol/L   Glucose, Bld 129 (H) 70 - 99 mg/dL   BUN 15 8 - 23 mg/dL   Creatinine, Ser 1.04 0.61 - 1.24 mg/dL   Calcium 9.2 8.9 - 10.3 mg/dL   GFR calc non Af Amer >60 >60 mL/min   GFR calc Af Amer >60 >60 mL/min   Anion gap 12 5 - 15  CBC  Result Value Ref Range   WBC 8.9 4.0 - 10.5 K/uL   RBC 3.92 (L) 4.22 - 5.81 MIL/uL   Hemoglobin 10.7 (L) 13.0 - 17.0 g/dL   HCT 33.5 (L) 39.0 - 52.0 %   MCV 85.5 80.0 - 100.0 fL   MCH 27.3 26.0 - 34.0 pg   MCHC 31.9 30.0 - 36.0 g/dL   RDW 13.3 11.5 - 15.5 %   Platelets 175 150 - 400 K/uL   nRBC 0.0 0.0 - 0.2 %  Glucose, capillary  Result Value Ref Range   Glucose-Capillary 97 70 - 99 mg/dL   Comment 1 Notify RN   Glucose, capillary  Result Value Ref Range   Glucose-Capillary 98 70 - 99 mg/dL  Glucose, capillary  Result Value Ref Range   Glucose-Capillary 116 (H) 70 - 99 mg/dL  Glucose, capillary  Result Value Ref Range   Glucose-Capillary 110 (H) 70 - 99 mg/dL  POCT Activated clotting time  Result Value Ref Range   Activated Clotting Time 246 seconds  POCT Activated clotting time  Result Value Ref Range   Activated Clotting Time 279 seconds  POCT Activated clotting  time  Result Value Ref Range    Activated Clotting Time 290 seconds      Assessment & Plan:   Problem List Items Addressed This Visit    None    Visit Diagnoses    Right arm pain    -  Primary   Concern for clot. Will obtain US to R/o DVT. Await results.    Relevant Orders   US Venous Img Upper Uni Right(DVT)   Radicular leg pain       Known DJD- acting up. Will check x-ray. He is seeing pain management on Monday. Call with any concerns. Await results.    Relevant Orders   DG Lumbar Spine Complete       Follow up plan: Return Pending results.

## 2019-10-25 ENCOUNTER — Telehealth: Payer: Self-pay

## 2019-10-25 ENCOUNTER — Ambulatory Visit: Payer: Self-pay | Admitting: Pharmacist

## 2019-10-25 NOTE — Chronic Care Management (AMB) (Signed)
  Chronic Care Management   Note  10/25/2019 Name: Edward Schneider. MRN: [id number] DOB: 08-07-1956  [name] is a 64 y.o. year old male who is a primary care patient of Crissman, Jeannette How, MD. The CCM team was consulted for assistance with chronic disease management and care coordination needs.    Attempted to contact patient to discuss medication management and medication access needs (re-enrollment for [employer] assistance for 2021). Left HIPAA compliant message for patient to return my call at his convenience.   Follow up plan: - Will collaborate w/ Care Guide to schedule follow up phone call with me.   Catie Darnelle Maffucci, PharmD, [place] [phone]

## 2019-10-26 ENCOUNTER — Encounter: Payer: Self-pay | Admitting: Internal Medicine

## 2019-10-26 ENCOUNTER — Other Ambulatory Visit: Payer: Self-pay

## 2019-10-26 ENCOUNTER — Telehealth: Payer: Self-pay | Admitting: Family Medicine

## 2019-10-26 ENCOUNTER — Ambulatory Visit (INDEPENDENT_AMBULATORY_CARE_PROVIDER_SITE_OTHER): Payer: Medicare Other | Admitting: Internal Medicine

## 2019-10-26 VITALS — BP 118/64 | HR 90 | Temp 98.4°F | Ht 68.0 in | Wt 242.6 lb

## 2019-10-26 DIAGNOSIS — E1165 Type 2 diabetes mellitus with hyperglycemia: Secondary | ICD-10-CM

## 2019-10-26 DIAGNOSIS — E1142 Type 2 diabetes mellitus with diabetic polyneuropathy: Secondary | ICD-10-CM | POA: Diagnosis not present

## 2019-10-26 DIAGNOSIS — E1159 Type 2 diabetes mellitus with other circulatory complications: Secondary | ICD-10-CM | POA: Diagnosis not present

## 2019-10-26 LAB — POCT GLYCOSYLATED HEMOGLOBIN (HGB A1C): Hemoglobin A1C: 7.9 % — AB (ref 4.0–5.6)

## 2019-10-26 LAB — GLUCOSE, POCT (MANUAL RESULT ENTRY): POC Glucose: 219 mg/dl — AB (ref 70–99)

## 2019-10-26 MED ORDER — INSULIN ASPART 100 UNIT/ML FLEXPEN: PEN_INJECTOR | SUBCUTANEOUS | 11 refills | Status: DC

## 2019-10-26 NOTE — Patient Instructions (Addendum)
-   Keep the good work - you have done great work ! - Tresiba 24 units daily  - Novolog 14 units with Breakfast , 13 units with Lunch and supper - Continue Ozempic 0.5 mg weekly      HOW TO TREAT LOW BLOOD SUGARS (Blood sugar LESS THAN 70 MG/DL)  Please follow the RULE OF 15 for the treatment of hypoglycemia treatment (when your (blood sugars are less than 70 mg/dL)    STEP 1: Take 15 grams of carbohydrates when your blood sugar is low, which includes:   3-4 GLUCOSE TABS  OR  3-4 OZ OF JUICE OR REGULAR SODA OR  ONE TUBE OF GLUCOSE GEL     STEP 2: RECHECK blood sugar in 15 MINUTES STEP 3: If your blood sugar is still low at the 15 minute recheck --> then, go back to STEP 1 and treat AGAIN with another 15 grams of carbohydrates.
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
## 2019-10-26 NOTE — Chronic Care Management (AMB) (Signed)
  Care Management   Note  10/26/2019 Name: Edward Schneider. MRN: [id number] DOB: 11/29/1955  Edward Schneider. is a 64 y.o. year old male who is a primary care patient of [facility] Maple, MD and is actively engaged with the care management team. I reached out to Edward Schneider. by phone today to assist with re-scheduling a follow up appointment with the Pharmacist  Follow up plan: Telephone appointment with care management team member scheduled for: 11/17/2019  [place], [place] [facility]  [place],  [id number] Direct Dial: [email or web address]  Website: [email or web address]

## 2019-10-27 ENCOUNTER — Telehealth: Payer: Self-pay

## 2019-10-27 NOTE — Telephone Encounter (Signed)
Lft vm for pt informing him that his ozempic from patient assistance has arrived

## 2019-10-29 ENCOUNTER — Encounter: Payer: Self-pay | Admitting: Internal Medicine

## 2019-10-29 DIAGNOSIS — E119 Type 2 diabetes mellitus without complications: Secondary | ICD-10-CM | POA: Insufficient documentation

## 2019-10-29 DIAGNOSIS — E1165 Type 2 diabetes mellitus with hyperglycemia: Secondary | ICD-10-CM | POA: Insufficient documentation

## 2019-10-30 ENCOUNTER — Ambulatory Visit: Payer: Medicare Other | Admitting: Internal Medicine

## 2019-10-30 ENCOUNTER — Encounter: Payer: Self-pay | Admitting: Internal Medicine

## 2019-10-30 ENCOUNTER — Ambulatory Visit (INDEPENDENT_AMBULATORY_CARE_PROVIDER_SITE_OTHER): Payer: Medicare Other | Admitting: Internal Medicine

## 2019-10-30 ENCOUNTER — Other Ambulatory Visit: Payer: Self-pay

## 2019-10-30 VITALS — BP 138/62 | HR 71 | Ht 68.0 in | Wt 242.5 lb

## 2019-10-30 DIAGNOSIS — I10 Essential (primary) hypertension: Secondary | ICD-10-CM

## 2019-10-30 DIAGNOSIS — Z79899 Other long term (current) drug therapy: Secondary | ICD-10-CM

## 2019-10-30 DIAGNOSIS — E785 Hyperlipidemia, unspecified: Secondary | ICD-10-CM

## 2019-10-30 DIAGNOSIS — I25118 Atherosclerotic heart disease of native coronary artery with other forms of angina pectoris: Secondary | ICD-10-CM

## 2019-10-30 DIAGNOSIS — G894 Chronic pain syndrome: Secondary | ICD-10-CM | POA: Diagnosis not present
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
## 2019-10-30 NOTE — Patient Instructions (Signed)
Medication Instructions:  Your physician recommends that you continue on your current medications as directed. Please refer to the Current Medication list given to you today.  *If you need a refill on your cardiac medications before your next appointment, please call your pharmacy*  Lab Work: Your physician recommends that you return for lab work at your earliest convenience. Checking LIPID, ALT.  - - You will need to be fasting. Please do not have anything to eat or drink after midnight the morning you have the lab work. You may only have water or black coffee with no cream or sugar. - Please go to the [facility]. You will check in at the front desk to the right as you walk into the atrium. Valet Parking is offered if needed. - No appointment needed. You may go any day between 7 am and 6 pm.   If you have labs (blood work) drawn today and your tests are completely normal, you will receive your results only by: Marland Kitchen MyChart Message (if you have MyChart) OR . A paper copy in the mail If you have any lab test that is abnormal or we need to change your treatment, we will call you to review the results.  Testing/Procedures: none  Follow-Up: At [facility], you and your health needs are our priority.  As part of our continuing mission to provide you with exceptional heart care, we have created designated Provider Care Teams.  These Care Teams include your primary Cardiologist (physician) and Advanced Practice Providers (APPs -  Physician Assistants and Nurse Practitioners) who all work together to provide you with the care you need, when you need it.  Your next appointment:   4 month(s)  The format for your next appointment:   In Person  Provider:    You may see Nelva Bush, MD or one of the following Advanced Practice Providers on your designated Care Team:    Murray Hodgkins, NP  Christell Faith, PA-C  Marrianne Mood, PA-C

## 2019-10-31 ENCOUNTER — Other Ambulatory Visit: Payer: Self-pay | Admitting: Nurse Practitioner

## 2019-10-31 ENCOUNTER — Ambulatory Visit (INDEPENDENT_AMBULATORY_CARE_PROVIDER_SITE_OTHER): Payer: Medicare Other | Admitting: Pharmacist

## 2019-10-31 DIAGNOSIS — I25110 Atherosclerotic heart disease of native coronary artery with unstable angina pectoris: Secondary | ICD-10-CM

## 2019-10-31 DIAGNOSIS — E1165 Type 2 diabetes mellitus with hyperglycemia: Secondary | ICD-10-CM

## 2019-10-31 DIAGNOSIS — J449 Chronic obstructive pulmonary disease, unspecified: Secondary | ICD-10-CM | POA: Diagnosis not present

## 2019-10-31 DIAGNOSIS — Z794 Long term (current) use of insulin: Secondary | ICD-10-CM

## 2019-10-31 MED ORDER — ALBUTEROL SULFATE HFA 108 (90 BASE) MCG/ACT IN AERS: 2.0000 | INHALATION_SPRAY | Freq: Four times a day (QID) | RESPIRATORY_TRACT | 3 refills | Status: DC | PRN

## 2019-10-31 NOTE — Chronic Care Management (AMB) (Signed)
Chronic Care Management   Follow Up Note   10/31/2019 Name: Edward Schneider. MRN: [id number] DOB: 1956/06/27  Referred by: Guadalupe Maple, MD Reason for referral : Chronic Care Management (Medication Management)   Edward Schneider. is a 64 y.o. year old male who is a primary care patient of Crissman, Jeannette How, MD. The CCM team was consulted for assistance with chronic disease management and care coordination needs.    Received call back from patient regarding medication access.  Review of patient status, including review of consultants reports, relevant laboratory and other test results, and collaboration with appropriate care team members and the patient's provider was performed as part of comprehensive patient evaluation and provision of chronic care management services.    SDOH (Social Determinants of Health) screening performed today: Financial Strain . See Care Plan for related entries.   Outpatient Encounter Medications as of 10/31/2019  Medication Sig Note  . albuterol (VENTOLIN HFA) 108 (90 Base) MCG/ACT inhaler Inhale 2 puffs into the lungs every 6 (six) hours as needed for wheezing or shortness of breath. 09/26/2019: Taking 1-3 times per day  . insulin aspart (NOVOLOG) 100 UNIT/ML FlexPen Inject 14 Units into the skin Schneider with breakfast AND 13 Units Schneider with lunch AND 13 Units Schneider with supper.   . insulin degludec (TRESIBA FLEXTOUCH) 100 UNIT/ML SOPN FlexTouch Pen Inject 0.24 mLs (24 Units total) into the skin Schneider.   . Semaglutide,0.25 or 0.5MG/DOS, (OZEMPIC, 0.25 OR 0.5 MG/DOSE,) 2 MG/1.5ML SOPN Inject 0.5 mg into the skin once a week.   . umeclidinium-vilanterol (ANORO ELLIPTA) 62.5-25 MCG/INH AEPB Inhale 1 puff into the lungs Schneider.   Edward Kitchen ACCU-CHEK AVIVA PLUS test strip USE 1 STRIP TO CHECK GLUCOSE ONCE Schneider   . Accu-Chek Softclix Lancets lancets USE 1  TO CHECK GLUCOSE ONCE Schneider   . Ascorbic Acid (VITAMIN C) 1000 MG tablet Take 1,000 mg by mouth Schneider.   Edward Kitchen aspirin EC  81 MG EC tablet Take 1 tablet (81 mg total) by mouth Schneider.   Edward Kitchen atorvastatin (LIPITOR) 40 MG tablet Take 1 tablet (40 mg total) by mouth Schneider.   . benazepril (LOTENSIN) 20 MG tablet Take 20 mg by mouth Schneider.   . cephALEXin (KEFLEX) 500 MG capsule Take 500 mg by mouth 2 (two) times Schneider.   . Cholecalciferol (VITAMIN D3) 125 MCG (5000 UT) CAPS Take 1 capsule by mouth Schneider.    Edward Kitchen CINNAMON PO Take 1,000 mg by mouth 2 (two) times Schneider.    . clopidogrel (PLAVIX) 75 MG tablet Take 1 tablet (75 mg total) by mouth Schneider with breakfast.   . cyclobenzaprine (FLEXERIL) 10 MG tablet Take 1 tablet (10 mg total) by mouth 3 (three) times Schneider as needed for muscle spasms.   . diclofenac sodium (VOLTAREN) 1 % GEL Apply 2 g topically 4 (four) times Schneider.   . diphenhydrAMINE (BENADRYL) 25 mg capsule Take 25 mg by mouth 2 (two) times Schneider.    Edward Kitchen doxepin (SINEQUAN) 25 MG capsule Take 50 mg by mouth at bedtime. 09/26/2019: Taking BID  . DULoxetine (CYMBALTA) 30 MG capsule LIMIT 1 2 CAPSULES BY MOUTH PER DAY OF TOLERATED 09/26/2019: 30 mg QAM, 30 mg mid-day, 60 mg HS  . DULoxetine (CYMBALTA) 60 MG capsule Take 1 capsule (60 mg total) by mouth Schneider.   Edward Kitchen EPINEPHrine 0.3 mg/0.3 mL IJ SOAJ injection Inject 0.3 mLs (0.3 mg total) into the muscle as needed for anaphylaxis.   . Ginger, Zingiber officinalis, (  GINGER PO) Take 1 Dose by mouth Schneider.   . Ginseng 100 MG CAPS Take by mouth.   . Insulin Pen Needle (B-D UF III MINI PEN NEEDLES) 31G X 5 MM MISC Schneider   . isosorbide mononitrate (IMDUR) 60 MG 24 hr tablet Take 1 tablet (60 mg total) by mouth Schneider.   . mupirocin ointment (BACTROBAN) 2 % APPLY OINTMENT TOPICALLY TWICE Schneider   . NARCAN 4 MG/0.1ML LIQD nasal spray kit CALL 911. ADMINISTER A SINGLE SPRAY OF NARCAN IN ONE NOSTRIL. REPEAT EVERY 3 MINUTES AS NEEDED IF NO OR MINIMAL RESPONSE.   . nitroGLYCERIN (NITROSTAT) 0.4 MG SL tablet Place 1 tablet (0.4 mg total) under the tongue every 5 (five) minutes as needed for  chest pain.   . Omega-3 1000 MG CAPS Take 1,000 mg by mouth 2 (two) times Schneider.    Edward Kitchen oxyCODONE (ROXICODONE) 15 MG immediate release tablet TAKE 1/2 TO 1 (ONE HALF TO ONE) TABLET BY MOUTH FOUR TO SIX TIMES Schneider IF TOLERATED NOTE TABLET IS 15 MG 09/26/2019: Taking 6 tab per day  . pantoprazole (PROTONIX) 40 MG tablet Take 1 tablet by mouth twice Schneider   . predniSONE (DELTASONE) 10 MG tablet 6 tabs today and tomorrow, 5 tabs the next 2 days, decrease by 1 every other day until gone   . pregabalin (LYRICA) 50 MG capsule TAKE 1 CAPSULE BY MOUTH THREE TIMES Schneider   . sucralfate (CARAFATE) 1 g tablet TAKE 1 TABLET BY MOUTH 4 TIMES Schneider... WITH MEALS AND AT BEDTIME   . traZODone (DESYREL) 100 MG tablet Take 1 tablet (100 mg total) by mouth at bedtime as needed for sleep.   Edward Kitchen triamcinolone cream (KENALOG) 0.1 % Apply 1 application topically 2 (two) times Schneider.   . vitamin A 10000 UNIT capsule Take 10,000 Units by mouth Schneider.   . vitamin B-12 (CYANOCOBALAMIN) 1000 MCG tablet Take 1,000 mcg by mouth Schneider.   . vitamin E 1000 UNIT capsule Take 1,000 Units by mouth Schneider.    No facility-administered encounter medications on file as of 10/31/2019.     Goals Addressed            This Visit's Progress     Patient Stated   . PharmD "I want to stay healthy" (pt-stated)       Current Barriers:  . Diabetes: uncontrolled but significantly improved, complicated by chronic medical conditions including atherosclerosis (s/p DES to LAD on 11/17), most recent A1c 7.9%; follows w/ Dr. Kelton Pillar @ [facility] o Due to reapply for Tresiba, Novolog, and Ozempic patient assistance from [employer] . Current antihyperglycemic regimen: Tresiba 24 units Schneider, Novolog 13 units TID with meals; Ozempic 0.5 o Hx GI intolerance to metformin . Cardiovascular risk reduction- s/p DES on 09/03/2019; follows w/ Dr. Saunders Revel  o Current hypertensive regimen: benazepril 10 mg Schneider, isosorbide 60 mg Schneider; no beta blocker d/t  bradycardia  o Current hyperlipidemia regimen: atorvastatin 40 mg; lipids/LFT ordered by Dr. Saunders Revel yesterday, patient to go to [place] lab to be drawn o Current antiplatelet regimen: ASA 81 mg Schneider, clopidogrel 75 mg Schneider; plan for DAPT x 12 months, then clopidogrel monotherapy indefinitely. . COPD; current tx Anoro Schneider; notes he is using albuterol 2-3 times Schneider. Feels like the powder of Anoro gets caught in his mouth/lungs . Chronic pain- follows w/ Dr. Primus Bravo o Doxepin 25 mg BID, duloxetine 30 mg QAM, 30 mg Qafternoon, 60 mg QPM; pregabalin 50 mg TID, oxycodone IR 15 mg Q4H;  cyclobenzaprine 10 mg TID, diclofenac gel PRN,   Pharmacist Clinical Goal(s):  Edward Kitchen Over the next 90 days, patient will work with PharmD and primary care provider to address optimized medication management  Interventions: . Congratulated patient on improvement in A1c. He notes Dr. Kelton Pillar discussed increasing Ozempic dose, but decided to defer until next appointment.  . Due to reapply for patient assistance. Will collaborate w/ CPhT to fax provider portions to Dr. Fonda Kinder at [place]. Patient requested that I print his portions and he will come by clinic tomorrow and sign. He will provide 2021 income information, he needs to ask his wife if he has received it yet.  . Discussed patient's concerns about the Anoro powder getting stuck in his throat. Discussed that he does not immediately qualify for [place] patient assistance, as they require he spend $600 out of pocket on copayments first. He would qualify for Anoro assistance from [employer] is a Repimat inhaler, not powder. Discussed w/ Marnee Guarneri; she is in agreement. Will collaborate w/ provider on her potion of application for Stiolto through [employer]. Once all parts received, will collaborate w/ Susy Frizzle, CPhT for submission and follow up.  . Patient requests a refill on albuterol be sent to [facility]. Will collaborate w/ Marnee Guarneri on this . Will remind patient to get fasting lipid/LFT labwork done per Dr. Saunders Revel.  Patient Self Care Activities:  . Patient will check blood glucose BID , document, and provide at future appointments . Patient will take medications as prescribed . Patient will contact provider with any episodes of hypoglycemia . Patient will report any questions or concerns to provider   Please see past updates related to this goal by clicking on the "Past Updates" button in the selected goal         Plan: - Will collaborate w/ patient, providers, and CPhT as above - Scheduled follow up call 12/12/19 @ 2 pm  Catie Darnelle Maffucci, PharmD, [place] [phone]

## 2019-10-31 NOTE — Patient Instructions (Signed)
Visit Information  Goals Addressed            This Visit's Progress     Patient Stated   . PharmD "I want to stay healthy" (pt-stated)       Current Barriers:  . Diabetes: uncontrolled but significantly improved, complicated by chronic medical conditions including atherosclerosis (s/p DES to LAD on 11/17), most recent A1c 7.9%; follows w/ Dr. Kelton Pillar @ [facility] o Due to reapply for Tresiba, Novolog, and Ozempic patient assistance from [employer] . Current antihyperglycemic regimen: Tresiba 24 units daily, Novolog 13 units TID with meals; Ozempic 0.5 o Hx GI intolerance to metformin . Cardiovascular risk reduction- s/p DES on 09/03/2019; follows w/ Dr. Saunders Revel  o Current hypertensive regimen: benazepril 10 mg daily, isosorbide 60 mg daily; no beta blocker d/t bradycardia  o Current hyperlipidemia regimen: atorvastatin 40 mg; lipids/LFT ordered by Dr. Saunders Revel yesterday, patient to go to [facility] to be drawn o Current antiplatelet regimen: ASA 81 mg daily, clopidogrel 75 mg daily; plan for DAPT x 12 months, then clopidogrel monotherapy indefinitely. . COPD; current tx Anoro daily; notes he is using albuterol 2-3 times daily. Feels like the powder of Anoro gets caught in his mouth/lungs . Chronic pain- follows w/ Dr. Primus Bravo o Doxepin 25 mg BID, duloxetine 30 mg QAM, 30 mg Qafternoon, 60 mg QPM; pregabalin 50 mg TID, oxycodone IR 15 mg Q4H; cyclobenzaprine 10 mg TID, diclofenac gel PRN,   Pharmacist Clinical Goal(s):  Marland Kitchen Over the next 90 days, patient will work with PharmD and primary care provider to address optimized medication management  Interventions: . Congratulated patient on improvement in A1c. He notes Dr. Kelton Pillar discussed increasing Ozempic dose, but decided to defer until next appointment.  . Due to reapply for patient assistance. Will collaborate w/ CPhT to fax provider portions to Dr. Fonda Kinder at [facility]. Patient requested that I print his portions and  he will come by clinic tomorrow and sign. He will provide 2021 income information, he needs to ask his wife if he has received it yet.  . Discussed patient's concerns about the Anoro powder getting stuck in his throat. Discussed that he does not immediately qualify for [place] patient assistance, as they require he spend $600 out of pocket on copayments first. He would qualify for Anoro assistance from [place] is a Repimat inhaler, not powder. Discussed w/ Marnee Guarneri; she is in agreement. Will collaborate w/ provider on her potion of application for Stiolto through [employer]. Once all parts received, will collaborate w/ Susy Frizzle, CPhT for submission and follow up.  . Patient requests a refill on albuterol be sent to [facility]. Will collaborate w/ Marnee Guarneri on this . Will remind patient to get fasting lipid/LFT labwork done per Dr. Saunders Revel.  Patient Self Care Activities:  . Patient will check blood glucose BID , document, and provide at future appointments . Patient will take medications as prescribed . Patient will contact provider with any episodes of hypoglycemia . Patient will report any questions or concerns to provider   Please see past updates related to this goal by clicking on the "Past Updates" button in the selected goal         The patient verbalized understanding of instructions provided today and declined a print copy of patient instruction materials.   Plan: - Will collaborate w/ patient, providers, and CPhT as above - Scheduled follow up call 12/12/19 @ 2 pm  Catie Darnelle Maffucci, PharmD, BCACP Clinical  Pharmacist [place] [phone]

## 2019-11-02 ENCOUNTER — Other Ambulatory Visit: Payer: Self-pay | Admitting: Pharmacy Technician

## 2019-11-02 ENCOUNTER — Encounter: Payer: Self-pay | Admitting: Family Medicine

## 2019-11-02 NOTE — Patient Outreach (Addendum)
[place] [facility]) Care Management  11/02/2019  Arty Gradillas 1956-07-16 [id number]                                        Medication Assistance Referral  Referral From: [facility] Embedded RPh Catie T.   Medication/Company: Mirian Mo, Ozempic / [place] Patient application portion:  N/A Patient to sign in clinic with embedded [facility] RPh Provider application portion: Faxed  to Dr. Collier Flowers Provider address/fax verified via: Office website               Medication/Company: Beau Fanny / [place] Patient application portion:  N/A Patient to sign in clinic with embedded [facility] RPh Provider application portion:  N/A Embedded RPh to have signed while in clnic to Marnee Guarneri, NP Provider address/fax verified via: Office website   Follow up:  Will follow up with patient/provider/embedded RPh Catie Travis in 10-14 business days to confirm application(s) have been received.  Marco Adelson P. Janequa Kipnis, [place] Management [phone]

## 2019-11-09 ENCOUNTER — Other Ambulatory Visit: Payer: Self-pay | Admitting: Pharmacy Technician

## 2019-11-09 NOTE — Patient Outreach (Signed)
[place] [facility]) Care Management  11/09/2019  Meade Edd [birth year] [id number]    Received both patient and provider portion(s) of patient assistance application(s) for Stiolto ([employer]) and Tyler Aas, Novolog, Ozempic [employer]). Faxed completed application and required documents into [employer] and [employer].  Will follow up with company(ies) in 3-7 business days to check status of application(s).  Landra Howze P. Khamila Bassinger, [place] Management [phone]

## 2019-11-14 ENCOUNTER — Other Ambulatory Visit: Payer: Self-pay | Admitting: Pharmacy Technician

## 2019-11-14 ENCOUNTER — Telehealth: Payer: Self-pay | Admitting: Family Medicine

## 2019-11-14 NOTE — Chronic Care Management (AMB) (Signed)
°  Care Management   Note  11/14/2019 Name: Edmar Igarashi. MRN: [id number] DOB: 1956/03/03  [name] is a 64 y.o. year old male who is a primary care patient of Guadalupe Maple, MD and is actively engaged with the care management team. I reached out to [name] by phone today to assist with re-scheduling a follow up visit with the Pharmacist  Follow up plan: Telephone appointment with care management team member scheduled for: 12/06/2019  Noreene Larsson, [place], [place], [place] [address] Direct Dial: [phone] [email or web address]: [email or web address]

## 2019-11-14 NOTE — Patient Outreach (Signed)
[place] [facility]) Care Management  11/14/2019  Lucy Sacre 06/18/1956 [id number]  Care coordination call placed to [place] in regards to patient's application for Tresiba, Novolog and Ozempic.  Spoke to [place] who informed patient was APPROVED for the program 11/13/2019-09/17/2020. He informed a 4 months supply of the [place] and Novolog were shipped on 11/12/2018 with an expected arrival date of up to 10-14 business days to the provider's office.  He informed that the Ozempic was last refilled in December and is not due to ship out until 01/04/2020.  Will route note to [place] to update her.  Will follow up with patient in 10-14 business days to confirm receipt of medication.  Linton Stolp P. Jerine Surles, [place] Management [phone]

## 2019-11-16 ENCOUNTER — Other Ambulatory Visit: Payer: Self-pay | Admitting: Pharmacy Technician

## 2019-11-16 NOTE — Patient Outreach (Signed)
[place] [facility]) Care Management  11/16/2019  Kornelius Seawell. May 05, 1956 [id number]    Care coordination call placed to [place] in regards to patient's Stiolto application.  Spoke to [place] who informed patient was APPROVED 11/16/2019-10/18/2020. She informed the medication would ship next week for a 90 days supply and should arrive at the patient's home 7-10 business days after ship date.  Will follow up with patient in 10-14 business days to inquire if medication was received.  Kree Armato P. Emmons Toth, [place] Management [phone]

## 2019-11-17 ENCOUNTER — Telehealth: Payer: Self-pay

## 2019-11-20 ENCOUNTER — Ambulatory Visit: Payer: Medicare Other | Admitting: Nurse Practitioner

## 2019-11-21 ENCOUNTER — Telehealth: Payer: Self-pay

## 2019-11-21 NOTE — Telephone Encounter (Signed)
lft pt vm informing him that his medication from pt assistance has arrived at office and is ready for pick up.

## 2019-11-27 ENCOUNTER — Other Ambulatory Visit: Payer: Self-pay

## 2019-11-27 DIAGNOSIS — M545 Low back pain: Secondary | ICD-10-CM | POA: Diagnosis not present

## 2019-11-27 DIAGNOSIS — R609 Edema, unspecified: Secondary | ICD-10-CM | POA: Diagnosis not present

## 2019-11-27 DIAGNOSIS — M792 Neuralgia and neuritis, unspecified: Secondary | ICD-10-CM | POA: Diagnosis not present

## 2019-11-27 DIAGNOSIS — M4807 Spinal stenosis, lumbosacral region: Secondary | ICD-10-CM | POA: Diagnosis not present

## 2019-11-27 DIAGNOSIS — M5136 Other intervertebral disc degeneration, lumbar region: Secondary | ICD-10-CM | POA: Diagnosis not present

## 2019-11-27 DIAGNOSIS — G4700 Insomnia, unspecified: Secondary | ICD-10-CM | POA: Diagnosis not present

## 2019-11-27 DIAGNOSIS — E1142 Type 2 diabetes mellitus with diabetic polyneuropathy: Secondary | ICD-10-CM | POA: Diagnosis not present

## 2019-11-27 DIAGNOSIS — G894 Chronic pain syndrome: Secondary | ICD-10-CM | POA: Diagnosis not present

## 2019-11-27 MED ORDER — PANTOPRAZOLE SODIUM 40 MG PO TBEC: 40.0000 mg | DELAYED_RELEASE_TABLET | Freq: Two times a day (BID) | ORAL | 1 refills | Status: DC

## 2019-11-27 NOTE — Telephone Encounter (Signed)
Refill request for Pantoprazole  LOV: 10/24/2019 Next Appt: 12/06/2019

## 2019-11-28 ENCOUNTER — Telehealth: Payer: Self-pay | Admitting: Internal Medicine

## 2019-11-28 NOTE — Telephone Encounter (Signed)
Attempted to reach patient again and it went straight to VM.

## 2019-11-28 NOTE — Telephone Encounter (Signed)
Called patient back and he did not answer.  Left a message for him to call back.  Said if it was after 5 pm to please speak with the oncall provider and they can help advise him as well. Advised him to call 911 or go to the Emergency room if having continuous chest pain since I did not get to talk to him.

## 2019-11-28 NOTE — Telephone Encounter (Signed)
Pt c/o of Chest Pain: STAT if CP now or developed within 24 hours  1. Are you having CP right now? yes  2. Are you experiencing any other symptoms (ex. SOB, nausea, vomiting, sweating)? SOB - hurts to breathe  3. How long have you been experiencing CP? Since yesterday   4. Is your CP continuous or coming and going? Continuous   5. Have you taken Nitroglycerin? Took 2 yesterday, not today  ? Patient scheduled first available 2/16 with Elenor Quinones

## 2019-11-29 NOTE — Telephone Encounter (Signed)
Patient called in to say he took two nitro's and then pain went away. He is feeling better but will still keep appointment on 2/16

## 2019-11-29 NOTE — Telephone Encounter (Signed)
No answer. Left message to call back if chest pain returns prior to appointment so we can get him into an appointment sooner.

## 2019-12-01 ENCOUNTER — Other Ambulatory Visit: Payer: Self-pay | Admitting: Pharmacy Technician
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
## 2019-12-01 NOTE — Patient Outreach (Signed)
[facility]) Care Management  12/01/2019  Edward Schneider 1956-07-05 [id number]    Unsuccessful call placed to patient regarding patient assistance medication delivery of Tresiba and Novolog with [employer] and [place] with BI, HIPAA compliant voicemail left.   Unfortunately patient did not answer the phone.  Was calling to inquire if he has picked up the [place] and Novolog from the provider's office as it was supposed to be delivered 10-14 days from 11/13/2019 and was calling to inquire if the Stiolto was delivered as it was supposed to arrive in 7-10 business days from the ship date of 11/16/2019.   Follow up:  Will attempt another outreach call in 5-10 business days if call is not returned.  Tewana Bohlen P. Ura Hausen, [place] Management [phone]

## 2019-12-05 ENCOUNTER — Other Ambulatory Visit: Payer: Self-pay

## 2019-12-05 ENCOUNTER — Encounter: Payer: Self-pay | Admitting: Physician Assistant

## 2019-12-05 ENCOUNTER — Ambulatory Visit (INDEPENDENT_AMBULATORY_CARE_PROVIDER_SITE_OTHER): Payer: Medicare Other | Admitting: Physician Assistant

## 2019-12-05 VITALS — BP 104/62 | HR 91 | Ht 68.0 in | Wt 246.8 lb

## 2019-12-05 DIAGNOSIS — I10 Essential (primary) hypertension: Secondary | ICD-10-CM

## 2019-12-05 DIAGNOSIS — I25118 Atherosclerotic heart disease of native coronary artery with other forms of angina pectoris: Secondary | ICD-10-CM | POA: Diagnosis not present

## 2019-12-05 DIAGNOSIS — R079 Chest pain, unspecified: Secondary | ICD-10-CM | POA: Diagnosis not present

## 2019-12-05 DIAGNOSIS — R000 Tachycardia, unspecified: Secondary | ICD-10-CM

## 2019-12-05 DIAGNOSIS — K219 Gastro-esophageal reflux disease without esophagitis: Secondary | ICD-10-CM

## 2019-12-05 DIAGNOSIS — E785 Hyperlipidemia, unspecified: Secondary | ICD-10-CM

## 2019-12-05 DIAGNOSIS — R42 Dizziness and giddiness: Secondary | ICD-10-CM | POA: Diagnosis not present

## 2019-12-05 DIAGNOSIS — R002 Palpitations: Secondary | ICD-10-CM | POA: Diagnosis not present

## 2019-12-05 MED ORDER — ISOSORBIDE MONONITRATE ER 60 MG PO TB24: 90.0000 mg | ORAL_TABLET | Freq: Every day | ORAL | 1 refills | Status: DC

## 2019-12-05 NOTE — Patient Instructions (Signed)
Medication Instructions:  1- INCREASE Imdur 1.5 tablet (90 mg total) once daily 2- DECREASE Benazapril 0.5 tablet ( 10 mg total) once daily   *If you need a refill on your cardiac medications before your next appointment, please call your pharmacy*  Lab Work: Your physician recommends that you have lab work today(CBC, BMET, LFT and Lipids)  If you have labs (blood work) drawn today and your tests are completely normal, you will receive your results only by: Marland Kitchen MyChart Message (if you have MyChart) OR . A paper copy in the mail If you have any lab test that is abnormal or we need to change your treatment, we will call you to review the results.  Testing/Procedures: 1- Your physician has requested that you have a carotid duplex. This test is an ultrasound of the carotid arteries in your neck. It looks at blood flow through these arteries that supply the brain with blood. Allow one hour for this exam. There are no restrictions or special instructions.  2- Echo  Please return to [facility] on ______________ at _______________ AM/PM for an Echocardiogram. Your physician has requested that you have an echocardiogram. Echocardiography is a painless test that uses sound waves to create images of your heart. It provides your doctor with information about the size and shape of your heart and how well your heart's chambers and valves are working. This procedure takes approximately one hour. There are no restrictions for this procedure. Please note; depending on visual quality an IV may need to be placed.    Follow-Up: At [facility], you and your health needs are our priority.  As part of our continuing mission to provide you with exceptional heart care, we have created designated Provider Care Teams.  These Care Teams include your primary Cardiologist (physician) and Advanced Practice Providers (APPs -  Physician Assistants and Nurse Practitioners) who all work together to provide you  with the care you need, when you need it.  Your next appointment:   2 week(s)  The format for your next appointment:   In Person  Provider:    You may see Nelva Bush, MD or Marrianne Mood, PA-C.

## 2019-12-06 ENCOUNTER — Encounter: Payer: Self-pay | Admitting: Nurse Practitioner

## 2019-12-06 ENCOUNTER — Other Ambulatory Visit: Payer: Self-pay

## 2019-12-06 ENCOUNTER — Ambulatory Visit (INDEPENDENT_AMBULATORY_CARE_PROVIDER_SITE_OTHER): Payer: Medicare Other | Admitting: Pharmacist

## 2019-12-06 ENCOUNTER — Telehealth: Payer: Self-pay

## 2019-12-06 ENCOUNTER — Ambulatory Visit (INDEPENDENT_AMBULATORY_CARE_PROVIDER_SITE_OTHER): Payer: Medicare Other | Admitting: Nurse Practitioner

## 2019-12-06 VITALS — BP 109/67 | HR 94 | Temp 98.7°F

## 2019-12-06 DIAGNOSIS — I25110 Atherosclerotic heart disease of native coronary artery with unstable angina pectoris: Secondary | ICD-10-CM | POA: Diagnosis not present

## 2019-12-06 DIAGNOSIS — E785 Hyperlipidemia, unspecified: Secondary | ICD-10-CM

## 2019-12-06 DIAGNOSIS — Z794 Long term (current) use of insulin: Secondary | ICD-10-CM | POA: Diagnosis not present

## 2019-12-06 DIAGNOSIS — J449 Chronic obstructive pulmonary disease, unspecified: Secondary | ICD-10-CM

## 2019-12-06 DIAGNOSIS — G4733 Obstructive sleep apnea (adult) (pediatric): Secondary | ICD-10-CM

## 2019-12-06 DIAGNOSIS — E11641 Type 2 diabetes mellitus with hypoglycemia with coma: Secondary | ICD-10-CM

## 2019-12-06 DIAGNOSIS — I25118 Atherosclerotic heart disease of native coronary artery with other forms of angina pectoris: Secondary | ICD-10-CM

## 2019-12-06 DIAGNOSIS — E1169 Type 2 diabetes mellitus with other specified complication: Secondary | ICD-10-CM | POA: Diagnosis not present

## 2019-12-06 DIAGNOSIS — J439 Emphysema, unspecified: Secondary | ICD-10-CM

## 2019-12-06 DIAGNOSIS — E1165 Type 2 diabetes mellitus with hyperglycemia: Secondary | ICD-10-CM | POA: Diagnosis not present

## 2019-12-06 DIAGNOSIS — I10 Essential (primary) hypertension: Secondary | ICD-10-CM

## 2019-12-06 DIAGNOSIS — E1159 Type 2 diabetes mellitus with other circulatory complications: Secondary | ICD-10-CM

## 2019-12-06 LAB — BASIC METABOLIC PANEL
BUN/Creatinine Ratio: 12 (ref 10–24)
BUN: 15 mg/dL (ref 8–27)
CO2: 23 mmol/L (ref 20–29)
Calcium: 10.1 mg/dL (ref 8.6–10.2)
Chloride: 100 mmol/L (ref 96–106)
Creatinine, Ser: 1.27 mg/dL (ref 0.76–1.27)
GFR calc Af Amer: 70 mL/min/{1.73_m2} (ref >59)
GFR calc non Af Amer: 60 mL/min/{1.73_m2} (ref >59)
Glucose: 259 mg/dL — ABNORMAL HIGH (ref 65–99)
Potassium: 4.5 mmol/L (ref 3.5–5.2)
Sodium: 138 mmol/L (ref 134–144)

## 2019-12-06 LAB — HEPATIC FUNCTION PANEL
ALT: 37 IU/L (ref 0–44)
AST: 29 IU/L (ref 0–40)
Albumin: 4.1 g/dL (ref 3.8–4.8)
Alkaline Phosphatase: 70 IU/L (ref 39–117)
Bilirubin Total: 0.4 mg/dL (ref 0.0–1.2)
Bilirubin, Direct: 0.14 mg/dL (ref 0.00–0.40)
Total Protein: 6.7 g/dL (ref 6.0–8.5)

## 2019-12-06 LAB — LIPID PANEL
Chol/HDL Ratio: 4 ratio (ref 0.0–5.0)
Cholesterol, Total: 174 mg/dL (ref 100–199)
HDL: 44 mg/dL (ref >39)
LDL Chol Calc (NIH): 75 mg/dL (ref 0–99)
Triglycerides: 345 mg/dL — ABNORMAL HIGH (ref 0–149)
VLDL Cholesterol Cal: 55 mg/dL — ABNORMAL HIGH (ref 5–40)

## 2019-12-06 LAB — CBC
Hematocrit: 36.1 % — ABNORMAL LOW (ref 37.5–51.0)
Hemoglobin: 11.8 g/dL — ABNORMAL LOW (ref 13.0–17.7)
MCH: 27.9 pg (ref 26.6–33.0)
MCHC: 32.7 g/dL (ref 31.5–35.7)
MCV: 85 fL (ref 79–97)
Platelets: 180 10*3/uL (ref 150–450)
RBC: 4.23 x10E6/uL (ref 4.14–5.80)
RDW: 13.3 % (ref 11.6–15.4)
WBC: 8 10*3/uL (ref 3.4–10.8)

## 2019-12-06 NOTE — Patient Instructions (Signed)
Visit Information  Goals Addressed            This Visit's Progress     Patient Stated   . PharmD "I want to stay healthy" (pt-stated)       Current Barriers:  . Diabetes: uncontrolled but significantly improved, complicated by chronic medical conditions including atherosclerosis (s/p DES to LAD on 11/17), most recent A1c 7.9%; follows w/ Dr. Kelton Pillar @ [place] . Current antihyperglycemic regimen: Tresiba 24 units daily, Novolog 14 units with breakfast, 13 units TID with lunch or supper; Ozempic 0.5 weekly o Notes he has 1 Ozempic pen at home. Is not due for a refill per [employer] until March o Hx GI intolerance to metformin o Fastings 108-136 . Cardiovascular risk reduction- s/p DES on 09/03/2019; follows w/ Dr. Saunders Revel; saw PA Mickle Plumb yesterday o Current hypertensive regimen: benazepril 10 mg daily (reduced from 20 mg) isosorbide increased to 90 mg daily d/t recent episodes of chest pain. o Current hyperlipidemia regimen: atorvastatin 40 mg, to increase to atorvastatin 80 mg daily per lab results today o Current antiplatelet regimen: ASA 81 mg daily, clopidogrel 75 mg daily; plan for DAPT x 12 months, then clopidogrel monotherapy indefinitely. . COPD; current tx Anoro daily; describes that the Anoro "starts shooting out" before he has the inhaler in his mouth; applied for [employer] assistance through [employer]. Patient reports that he got 1 inhaler in the mail, and while trying to set it up he says he wasted the medication.  o Notes using albuterol 2 puffs 3-4 times daily for SOB. Spirometry being performed today . Chronic pain- follows w/ Dr. Primus Bravo o Doxepin 25 mg BID, duloxetine 30 mg QAM, 30 mg Qafternoon, 60 mg QPM; pregabalin 50 mg TID, oxycodone IR 15 mg Q4H; cyclobenzaprine 10 mg TID, diclofenac gel PRN,  . Allergies/skin conditions: o Continues on cephalexin 500 mg BID d/t serious ant bites that have gotten infected o Using diphenhydramine 25 mg BID   Pharmacist Clinical  Goal(s):  Marland Kitchen Over the next 90 days, patient will work with PharmD and primary care provider to address optimized medication management  Interventions: . Comprehensive medication review performed, medication list updated in electronic medical record. Reviewed all supplement use as well . Recommended patient avoid ginger and ginseng d/t increased bleeding w/ ASA and clopidogrel. He notes that he does not have a specific reason he takes these supplements, he just takes them for "wellness" . Used Clinical cytogeneticist and Respimat. Demonstrated that Ellipta does not "spray", it requires patient inhalation to move powder particles. I am unsure exactly what patient is doing wrong, but could be that he is not getting the entire Anoro dose, therefore why he is needing frequent albuterol therapy.  . Reviewed Respimat device. Appears patient may not have been twisting before trying to spray inhaler, so he may not have wasted the entire inhaler. BI noted that they shipped two inhalers. He is going to try this tomorrow and give me a call with how it went.  . Will call BI regarding the fact that patient received 1 inhaler, not 3 . Messaged Lolita Rieger, CMA w/ Dr. Kelton Pillar. Patient had a 4 month supply of Ozempic shipped in December, can see where it arrived on 10/27/19. Patient may not have picked up this supply yet.  . Reviewed changes from cardiology: benazepril decreased to 10 mg, isosorbide increased to 90 mg, atorvastatin increased to 80 mg. He understands and can repeat back these changes.  Patient Self Care Activities:  .  Patient will check blood glucose BID , document, and provide at future appointments . Patient will take medications as prescribed . Patient will contact provider with any episodes of hypoglycemia . Patient will report any questions or concerns to provider   Please see past updates related to this goal by clicking on the "Past Updates" button in the selected goal         Patient verbalizes  understanding of instructions provided today.   Plan:  - Will await call from patient regarding use of Respimat inhaler device - Scheduled f/u call 01/16/20  Catie Darnelle Maffucci, PharmD, [place] [phone]

## 2019-12-06 NOTE — Progress Notes (Signed)
BP 109/67 (BP Location: Left Arm, Patient Position: Sitting, Cuff Size: Normal)   Pulse 94   Temp 98.7 F (37.1 C) (Oral)   SpO2 95%    Subjective:    Patient ID: Edward Schneider., male    DOB: Aug 30, 1956, 64 y.o.   MRN: [id number]  HPI: Edward Schneider  . Diabetes  . Hypertension  . Hyperlipidemia  . Sleep Apnea   DIABETES Sees endocrinology and last saw 10/26/2019 Schneider A1C 7.9%, Dr. Wilmon Pali. Changed insulin Tresiba to 24 units and Humalog 14 units Schneider breakfast and 13 units lunch and supper.  Taking Ozempic 0.5 MG weekly. Hypoglycemic episodes:no Polydipsia/polyuria:no Visual disturbance:no Chest pain:no Paresthesias:no Glucose Monitoring:yes Accucheck frequency: QID Fasting glucose: 108 to 136 Post prandial: 130's on average Evening: 118 last night Before meals: 100 to 110 Taking Insulin?:yes Long acting insulin: Tresiba 24 units Short acting insulin: Humalog 14 units Schneider breakfast and 13 units lunch and supper Blood Pressure Monitoring:daily Retinal Examination:Up to Date Foot Exam:Up to Date Pneumovax:Up to Date Influenza:Up to Date Aspirin:yes  HYPERTENSION / HYPERLIPIDEMIA Followed by cardiology and last saw yesterday.  Last echo March 2020 = EF 50-55% Schneider normal left ventricular diastolic parameters. Satisfied Schneider current treatment?yes Duration of hypertension:chronic BP monitoring frequency:rarely BP range:100-110/60-70's BP medication side effects:no Duration of hyperlipidemia:chronic Cholesterol medication side effects:no Cholesterol supplements: none Medication compliance:good compliance Aspirin:yes Recent stressors:no Recurrent headaches:no Visual changes:no Palpitations:no Dyspnea:no Chest pain:no Lower extremity edema:no Dizzy/lightheaded:no The 10-year ASCVD  risk score Edward Bussing DC Jr., et al., 2013) is: 16.1%   Values used to calculate the score:     Age: 41 years     Sex: Male     Is Non-Hispanic African American: No     Diabetic: Yes     Tobacco smoker: No     Systolic Blood Pressure: 0000000 mmHg     Is BP treated: Yes     HDL Cholesterol: 44 mg/dL     Total Cholesterol: 174 mg/dL   COPD Continues Anoro and Albuterol. Does use CPAP and uses 100% of the time. Smoked for 48 years, was 6-7 when started.  Smoked 3 cartons to a carton and a half a week.  Quit in 2012.  Goes for annual lung screening, last went July 2020.  States his Anoro has not been working right and is spraying incorrectly.  Has never been to pulmonary. COPD status: stable Satisfied Schneider current treatment?: yes Oxygen use: no Dyspnea frequency: when walks 300 feet starts to notice SOB, especially if uphill Cough frequency: minimal Rescue inhaler frequency:  using three times a day Limitation of activity: no Productive cough: none Last Spirometry: unknown  Pneumovax: Up to Date Influenza: Up to Date  Relevant past medical, surgical, family and social history reviewed and updated as indicated. Interim medical history since our last visit reviewed. Allergies and medications reviewed and updated.  Review of Systems  Constitutional: Negative for activity change, diaphoresis, fatigue and fever.  Respiratory: Positive for shortness of breath (occasional). Negative for cough, chest tightness and wheezing.   Cardiovascular: Negative for chest pain, palpitations and leg swelling.  Gastrointestinal: Negative.   Endocrine: Negative for cold intolerance, heat intolerance, polydipsia, polyphagia and polyuria.  Neurological: Negative.   Psychiatric/Behavioral: Negative.     Per HPI unless specifically indicated above     Objective:    BP 109/67 (BP Location: Left Arm, Patient Position: Sitting, Cuff Size: Normal)  Pulse 94   Temp 98.7 F (37.1 C) (Oral)   SpO2 95%   Wt  Readings from Last 3 Encounters:  12/05/19 246 lb 12 oz (111.9 kg)  10/30/19 242 lb 8 oz (110 kg)  10/26/19 242 lb 9.6 oz (110 kg)    Physical Exam Vitals and nursing note reviewed.  Constitutional:      General: He is awake. He is not in acute distress.    Appearance: He is well-developed. He is morbidly obese. He is not ill-appearing.  HENT:     Head: Normocephalic and atraumatic.     Right Ear: Hearing normal. No drainage.     Left Ear: Hearing normal. No drainage.  Eyes:     General: Lids are normal.        Right eye: No discharge.        Left eye: No discharge.     Conjunctiva/sclera: Conjunctivae normal.     Pupils: Pupils are equal, round, and reactive to light.  Neck:     Vascular: No carotid bruit.  Cardiovascular:     Rate and Rhythm: Normal rate and regular rhythm.     Heart sounds: Normal heart sounds, S1 normal and S2 normal. No murmur. No gallop.   Pulmonary:     Effort: Pulmonary effort is normal. No accessory muscle usage or respiratory distress.     Breath sounds: Normal breath sounds.     Comments: No wheezes, slightly diminished throughout. Abdominal:     General: Bowel sounds are normal.     Palpations: Abdomen is soft.  Musculoskeletal:        General: Normal range of motion.     Cervical back: Normal range of motion and neck supple.     Right lower leg: No edema.     Left lower leg: No edema.  Skin:    General: Skin is warm and dry.  Neurological:     Mental Status: He is alert and oriented to person, place, and time.     Deep Tendon Reflexes: Reflexes are normal and symmetric.  Psychiatric:        Attention and Perception: Attention normal.        Mood and Affect: Mood normal.        Speech: Speech normal.        Behavior: Behavior normal. Behavior is cooperative.    Results for orders placed or performed in visit on 12/05/19  Lipid panel  Result Value Ref Range   Cholesterol, Total 174 100 - 199 mg/dL   Triglycerides 345 (H) 0 - 149 mg/dL     HDL 44 >39 mg/dL   VLDL Cholesterol Cal 55 (H) 5 - 40 mg/dL   LDL Chol Calc (NIH) 75 0 - 99 mg/dL   Chol/HDL Ratio 4.0 0.0 - 5.0 ratio  Hepatic function panel  Result Value Ref Range   Total Protein 6.7 6.0 - 8.5 g/dL   Albumin 4.1 3.8 - 4.8 g/dL   Bilirubin Total 0.4 0.0 - 1.2 mg/dL   Bilirubin, Direct 0.14 0.00 - 0.40 mg/dL   Alkaline Phosphatase 70 39 - 117 IU/L   AST 29 0 - 40 IU/L   ALT 37 0 - 44 IU/L  CBC  Result Value Ref Range   WBC 8.0 3.4 - 10.8 x10E3/uL   RBC 4.23 4.14 - 5.80 x10E6/uL   Hemoglobin 11.8 (L) 13.0 - 17.7 g/dL   Hematocrit 36.1 (L) 37.5 - 51.0 %   MCV 85 79 - 97 fL  MCH 27.9 26.6 - 33.0 pg   MCHC 32.7 31.5 - 35.7 g/dL   RDW 13.3 11.6 - 15.4 %   Platelets 180 150 - 450 [id number]  Basic metabolic panel  Result Value Ref Range   Glucose 259 (H) 65 - 99 mg/dL   BUN 15 8 - 27 mg/dL   Creatinine, Ser 1.27 0.76 - 1.27 mg/dL   GFR calc non Af Amer 60 >59 mL/min/1.73   GFR calc Af Amer 70 >59 mL/min/1.73   BUN/Creatinine Ratio 12 10 - 24   Sodium 138 134 - 144 mmol/L   Potassium 4.5 3.5 - 5.2 mmol/L   Chloride 100 96 - 106 mmol/L   CO2 23 20 - 29 mmol/L   Calcium 10.1 8.6 - 10.2 mg/dL      Assessment & Plan:   Problem List Items Addressed This Visit      Cardiovascular and Mediastinum   Hypertension associated Schneider diabetes (HCC)    Chronic, stable Schneider BP below goal.  Continue current medication regimen and adjust as needed + continue collaboration Schneider cardiology team, recent lipid panel and CMP obtained by them.  Recommend he continue to monitor BP closely at home.  Return in 3 months.        Respiratory   Chronic obstructive pulmonary disease (HCC)    Chronic, ongoing.  FEV1 52% and FEV1/FVC 96% today.  Continue current inhaler regimen, Stiolto through assistance program received and Albuterol.  Educated on use today by CCM PharmD.  Will have return in 4 weeks to assess Schneider breathing and rescue inhaler use Schneider proper use of maintenance  inhaler.  If ongoing SOB, will consider referral to pulmonary for further testing and recommendations.      Relevant Orders   Spirometry Schneider Graph (Completed)   Sleep apnea    Chronic, continue 100% use of CPAP at home.        Endocrine   Hyperlipidemia associated Schneider type 2 diabetes mellitus (HCC)    Chronic, ongoing.  Continue current medication regimen and adjust as needed.  Lipid panel recently Schneider cardiology obtained.       Diabetes mellitus ([place]) - Primary    Chronic, ongoing.  Recent A1C Schneider endo 7.9%.  Will obtain urine micro next visit in office.  Continue collaboration Schneider endocrinology and current medication regimen.  Continue collaboration Schneider CCM team.  Recommend continue to monitor BS QID and focus heavily on diet and modest weight loss.  Return in 3 months.        Other   Morbid obesity ([place])    Recommend continued focus on healthy diet choices and regular physical activity (30 minutes 5 days a week).           Follow up plan: Return in about 4 weeks (around 01/03/2020) for COPD.

## 2019-12-06 NOTE — Assessment & Plan Note (Signed)
Chronic, ongoing.  Recent A1C with endo 7.9%.  Will obtain urine micro next visit in office.  Continue collaboration with endocrinology and current medication regimen.  Continue collaboration with CCM team.  Recommend continue to monitor BS QID and focus heavily on diet and modest weight loss.  Return in 3 months.

## 2019-12-06 NOTE — Assessment & Plan Note (Signed)
Recommend continued focus on healthy diet choices and regular physical activity (30 minutes 5 days a week).  

## 2019-12-06 NOTE — Chronic Care Management (AMB) (Signed)
Chronic Care Management   Follow Up Note   12/06/2019 Name: Edward Schneider. MRN: [id number] DOB: 1956/07/15  Referred by: Guadalupe Maple, MD Reason for referral : Chronic Care Management (Medication Management)   Edward Schneider. is a 64 y.o. year old male who is a primary care patient of Crissman, Jeannette How, MD. The CCM team was consulted for assistance with chronic disease management and care coordination needs.    Met with patient face to face at PCP appointment.  Review of patient status, including review of consultants reports, relevant laboratory and other test results, and collaboration with appropriate care team members and the patient's provider was performed as part of comprehensive patient evaluation and provision of chronic care management services.    SDOH (Social Determinants of Health) screening performed today: Financial Strain . See Care Plan for related entries.   Outpatient Encounter Medications as of 12/06/2019  Medication Sig Note   ACCU-CHEK AVIVA PLUS test strip USE 1 STRIP TO CHECK GLUCOSE ONCE DAILY    Accu-Chek Softclix Lancets lancets USE 1  TO CHECK GLUCOSE ONCE DAILY    albuterol (VENTOLIN HFA) 108 (90 Base) MCG/ACT inhaler Inhale 2 puffs into the lungs every 6 (six) hours as needed for wheezing or shortness of breath.    Ascorbic Acid (VITAMIN C) 1000 MG tablet Take 1,000 mg by mouth daily.    aspirin EC 81 MG EC tablet Take 1 tablet (81 mg total) by mouth daily.    atorvastatin (LIPITOR) 40 MG tablet Take 1 tablet (40 mg total) by mouth daily.    benazepril (LOTENSIN) 20 MG tablet Take 10 mg by mouth daily.    calcium carbonate (OS-CAL) 600 MG TABS tablet Take 600 mg by mouth daily.    cephALEXin (KEFLEX) 500 MG capsule Take 500 mg by mouth 2 (two) times daily.    Cholecalciferol (VITAMIN D3) 25 MCG (1000 UT) CHEW Chew 1 capsule by mouth daily.     CINNAMON PO Take 1,000 mg by mouth 2 (two) times daily.     clopidogrel (PLAVIX) 75 MG tablet  Take 1 tablet (75 mg total) by mouth daily with breakfast.    diclofenac sodium (VOLTAREN) 1 % GEL Apply 2 g topically 4 (four) times daily.    diphenhydrAMINE (BENADRYL) 25 mg capsule Take 25 mg by mouth 2 (two) times daily.     doxepin (SINEQUAN) 25 MG capsule Take 50 mg by mouth at bedtime. 09/26/2019: Taking BID   DULoxetine (CYMBALTA) 30 MG capsule LIMIT 1 2 CAPSULES BY MOUTH PER DAY OF TOLERATED 09/26/2019: 30 mg QAM, 30 mg mid-day, 60 mg HS   DULoxetine (CYMBALTA) 60 MG capsule Take 1 capsule (60 mg total) by mouth daily.    Flaxseed, Linseed, (FLAX SEED OIL) 1000 MG CAPS Take 1,000 mg by mouth daily.    Ginger, Zingiber officinalis, (GINGER PO) Take 1 Dose by mouth daily.    Ginseng 100 MG CAPS Take by mouth.    insulin aspart (NOVOLOG) 100 UNIT/ML FlexPen Inject 14 Units into the skin daily with breakfast AND 13 Units daily with lunch AND 13 Units daily with supper.    insulin degludec (TRESIBA FLEXTOUCH) 100 UNIT/ML SOPN FlexTouch Pen Inject 0.24 mLs (24 Units total) into the skin daily.    Insulin Pen Needle (B-D UF III MINI PEN NEEDLES) 31G X 5 MM MISC Daily    isosorbide mononitrate (IMDUR) 60 MG 24 hr tablet Take 1.5 tablets (90 mg total) by mouth daily.  Multiple Vitamins-Minerals (HAIR SKIN AND NAILS FORMULA PO) Take 1 capsule by mouth daily.    mupirocin ointment (BACTROBAN) 2 % APPLY OINTMENT TOPICALLY TWICE DAILY    Omega-3 1000 MG CAPS Take 1,000 mg by mouth daily.     oxyCODONE (ROXICODONE) 15 MG immediate release tablet TAKE 1/2 TO 1 (ONE HALF TO ONE) TABLET BY MOUTH FOUR TO SIX TIMES DAILY IF TOLERATED NOTE TABLET IS 15 MG 09/26/2019: Taking 6 tab per day   pantoprazole (PROTONIX) 40 MG tablet Take 1 tablet (40 mg total) by mouth 2 (two) times daily.    pregabalin (LYRICA) 50 MG capsule TAKE 1 CAPSULE BY MOUTH THREE TIMES DAILY    Semaglutide,0.25 or 0.5MG/DOS, (OZEMPIC, 0.25 OR 0.5 MG/DOSE,) 2 MG/1.5ML SOPN Inject 0.5 mg into the skin once a week.     sucralfate (CARAFATE) 1 g tablet TAKE 1 TABLET BY MOUTH 4 TIMES DAILY... WITH MEALS AND AT BEDTIME    traZODone (DESYREL) 100 MG tablet Take 1 tablet (100 mg total) by mouth at bedtime as needed for sleep.    triamcinolone cream (KENALOG) 0.1 % Apply 1 application topically 2 (two) times daily.    umeclidinium-vilanterol (ANORO ELLIPTA) 62.5-25 MCG/INH AEPB Inhale 1 puff into the lungs daily.    vitamin A 10000 UNIT capsule Take 10,000 Units by mouth daily.    vitamin B-12 (CYANOCOBALAMIN) 1000 MCG tablet Take 1,000 mcg by mouth daily.    Vitamin E 400 units TABS Take 400 Units by mouth daily.     cyclobenzaprine (FLEXERIL) 10 MG tablet Take 1 tablet (10 mg total) by mouth 3 (three) times daily as needed for muscle spasms. (Patient not taking: Reported on 12/06/2019)    EPINEPHrine 0.3 mg/0.3 mL IJ SOAJ injection Inject 0.3 mLs (0.3 mg total) into the muscle as needed for anaphylaxis. (Patient not taking: Reported on 12/06/2019)    NARCAN 4 MG/0.1ML LIQD nasal spray kit CALL 911. ADMINISTER A SINGLE SPRAY OF NARCAN IN ONE NOSTRIL. REPEAT EVERY 3 MINUTES AS NEEDED IF NO OR MINIMAL RESPONSE.    nitroGLYCERIN (NITROSTAT) 0.4 MG SL tablet Place 1 tablet (0.4 mg total) under the tongue every 5 (five) minutes as needed for chest pain. (Patient not taking: Reported on 12/06/2019)    [DISCONTINUED] predniSONE (DELTASONE) 10 MG tablet 6 tabs today and tomorrow, 5 tabs the next 2 days, decrease by 1 every other day until gone    No facility-administered encounter medications on file as of 12/06/2019.     Objective:   Goals Addressed            This Visit's Progress     Patient Stated    PharmD "I want to stay healthy" (pt-stated)       Current Barriers:   Diabetes: uncontrolled but significantly improved, complicated by chronic medical conditions including atherosclerosis (s/p DES to LAD on 11/17), most recent A1c 7.9%; follows w/ Dr. Kelton Pillar @ [facility]  Current antihyperglycemic  regimen: Tresiba 24 units daily, Novolog 14 units with breakfast, 13 units TID with lunch or supper; Ozempic 0.5 weekly o Notes he has 1 Ozempic pen at home. Is not due for a refill per [employer] until March o Hx GI intolerance to metformin o [place] 108-136  Cardiovascular risk reduction- s/p DES on 09/03/2019; follows w/ Dr. Saunders Revel; saw PA Mickle Plumb yesterday o Current hypertensive regimen: benazepril 10 mg daily (reduced from 20 mg) isosorbide increased to 90 mg daily d/t recent episodes of chest pain. o Current hyperlipidemia regimen: atorvastatin 40  mg, to increase to atorvastatin 80 mg daily per lab results today o Current antiplatelet regimen: ASA 81 mg daily, clopidogrel 75 mg daily; plan for DAPT x 12 months, then clopidogrel monotherapy indefinitely.  COPD; current tx Anoro daily; describes that the Anoro "starts shooting out" before he has the inhaler in his mouth; applied for [employer] assistance through [employer]. Patient reports that he got 1 inhaler in the mail, and while trying to set it up he says he wasted the medication.  o Notes using albuterol 2 puffs 3-4 times daily for SOB. Spirometry being performed today  Chronic pain- follows w/ Dr. Primus Bravo o Doxepin 25 mg BID, duloxetine 30 mg QAM, 30 mg Qafternoon, 60 mg QPM; pregabalin 50 mg TID, oxycodone IR 15 mg Q4H; cyclobenzaprine 10 mg TID, diclofenac gel PRN,   Allergies/skin conditions: o Continues on cephalexin 500 mg BID d/t serious ant bites that have gotten infected o Using diphenhydramine 25 mg BID   Pharmacist Clinical Goal(s):   Over the next 90 days, patient will work with PharmD and primary care provider to address optimized medication management  Interventions:  Comprehensive medication review performed, medication list updated in electronic medical record. Reviewed all supplement use as well  Recommended patient avoid ginger and ginseng d/t increased bleeding w/ ASA and clopidogrel. He notes that he does not have a  specific reason he takes these supplements, he just takes them for "wellness"  Used demo Ellipta and Respimat. Demonstrated that Ellipta does not "spray", it requires patient inhalation to move powder particles. I am unsure exactly what patient is doing wrong, but could be that he is not getting the entire Anoro dose, therefore why he is needing frequent albuterol therapy.   Reviewed Respimat device. Appears patient may not have been twisting before trying to spray inhaler, so he may not have wasted the entire inhaler. BI noted that they shipped two inhalers. He is going to try this tomorrow and give me a call with how it went.   Will call BI regarding the fact that patient received 1 inhaler, not 3  Messaged Lolita Rieger, CMA w/ Dr. Kelton Pillar. Patient had a 4 month supply of Ozempic shipped in December, can see where it arrived on 10/27/19. Patient may not have picked up this supply yet.   Reviewed changes from cardiology: benazepril decreased to 10 mg, isosorbide increased to 90 mg, atorvastatin increased to 80 mg. He understands and can repeat back these changes.  Patient Self Care Activities:   Patient will check blood glucose BID , document, and provide at future appointments  Patient will take medications as prescribed  Patient will contact provider with any episodes of hypoglycemia  Patient will report any questions or concerns to provider   Please see past updates related to this goal by clicking on the "Past Updates" button in the selected goal          Plan:  - Will await call from patient regarding use of Respimat inhaler device - Scheduled f/u call 01/16/20  Catie Darnelle Maffucci, PharmD, [place] [phone]

## 2019-12-06 NOTE — Telephone Encounter (Signed)
Attempted to call patient. [facility] 12/06/2019

## 2019-12-06 NOTE — Assessment & Plan Note (Signed)
Chronic, ongoing.  FEV1 52% and FEV1/FVC 96% today.  Continue current inhaler regimen, Stiolto through assistance program received and Albuterol.  Educated on use today by CCM PharmD.  Will have return in 4 weeks to assess with breathing and rescue inhaler use with proper use of maintenance inhaler.  If ongoing SOB, will consider referral to pulmonary for further testing and recommendations.

## 2019-12-06 NOTE — Telephone Encounter (Signed)
-----   Message from Arvil Chaco, PA-C sent at 12/06/2019  8:54 AM EST ----- Please let Mr. Thul know that his total cholesterol improved from 196 to 174. His triglycerides increased from 175 to 345. His LDL is still elevated and above goal at 75. (Goal LDL 70). HDL 44. His liver function is stable. Given his lipids, let's have him increase to atorvastatin 80mg  daily with repeat lipid and liver function in 6-8 weeks to reassess. His blood count have improved from his previous labs, though he is still somewhat anemic. Continue to keep a close eye on any signs of bleeding as discussed. His renal function and electrolytes are stable, which is reassuring. Please make sure he is monitoring his blood pressure at home after the most recent medication changes. He should call us if he notes it is elevated or if any questions.  (1) Increase to atorvastatin 80mg  daily (2) Repeat lipid and liver in 6-8 weeks

## 2019-12-06 NOTE — Assessment & Plan Note (Signed)
Chronic, continue 100% use of CPAP at home.

## 2019-12-06 NOTE — Assessment & Plan Note (Signed)
Chronic, ongoing.  Continue current medication regimen and adjust as needed.  Lipid panel recently with cardiology obtained.

## 2019-12-06 NOTE — Patient Instructions (Signed)

## 2019-12-06 NOTE — Assessment & Plan Note (Signed)
Chronic, stable with BP below goal.  Continue current medication regimen and adjust as needed + continue collaboration with cardiology team, recent lipid panel and CMP obtained by them.  Recommend he continue to monitor BP closely at home.  Return in 3 months.

## 2019-12-07 ENCOUNTER — Other Ambulatory Visit: Payer: Self-pay | Admitting: Pharmacy Technician

## 2019-12-07 ENCOUNTER — Ambulatory Visit: Payer: Self-pay | Admitting: Pharmacist

## 2019-12-07 DIAGNOSIS — J449 Chronic obstructive pulmonary disease, unspecified: Secondary | ICD-10-CM

## 2019-12-07 NOTE — Patient Instructions (Signed)
Visit Information  Goals Addressed            This Visit's Progress     Patient Stated   . PharmD "I want to stay healthy" (pt-stated)       Current Barriers:  . Diabetes: uncontrolled but significantly improved, complicated by chronic medical conditions including atherosclerosis (s/p DES to LAD on 11/17), most recent A1c 7.9%; follows w/ Dr. Kelton Pillar @ [facility] . Current antihyperglycemic regimen: Tresiba 24 units daily, Novolog 14 units with breakfast, 13 units TID with lunch or supper; Ozempic 0.5 weekly o Notes he has 1 Ozempic pen at home. Ozempic last filled by [facility] in December, shipped in January. Unsure if patient ever picked that up o Hx GI intolerance to metformin o Fastings 108-136 . Cardiovascular risk reduction- s/p DES on 09/03/2019; follows w/ Dr. Saunders Revel; saw PA Mickle Plumb yesterday o Current hypertensive regimen: benazepril 10 mg daily, isosorbide 90 mg daily d/t recent episodes of chest pain. o Current hyperlipidemia regimen: atorvastatin 40 mg, to increase to atorvastatin 80 mg daily per lab results today o Current antiplatelet regimen: ASA 81 mg daily, clopidogrel 75 mg daily; plan for DAPT x 12 months, then clopidogrel monotherapy indefinitely. . COPD; current tx Anoro daily, but approved for [employer] assistance. Demonstrated Stiolto administration technique in clinic yesterday. Patient reported he only received 1 inhaler of Stiolto . Chronic pain- follows w/ Dr. Primus Bravo o Doxepin 25 mg BID, duloxetine 30 mg QAM, 30 mg Qafternoon, 60 mg QPM; pregabalin 50 mg TID, oxycodone IR 15 mg Q4H; cyclobenzaprine 10 mg TID, diclofenac gel PRN,  . Allergies/skin conditions: o Continues on cephalexin 500 mg BID d/t serious ant bites that have gotten infected o Using diphenhydramine 25 mg BID   Pharmacist Clinical Goal(s):  Marland Kitchen Over the next 90 days, patient will work with PharmD and primary care provider to address optimized medication  management  Interventions: . Communicated w/ CMA [employer]. She will check and see if there is extra Ozempic that was Mr. Quain supply when she is at hte office next . Collaborated w/ Sharee Pimple Simcox. She called [facility], they noted that the Stiolto prescription was written for 1 puff daily - should be 2 puffs. They only dispensed patient 1 inhaler, as 1 inhaler has 60 puffs, so 60 day supply w/ incorrect prescription, and they cannot dispense more than 90 days at a time. I called, provided a clarified prescription for 2 puffs daily to the pharmacy. They are going to update and ship 3 inhalers, 90 day supply . Contacted patient to follow up on Stiolto administration, reiterate 2 puffs daily instructions, and review that a 90 day supply was coming to his house. Left voicemail for patient to return my call at his convenience.   Patient Self Care Activities:  . Patient will check blood glucose BID , document, and provide at future appointments . Patient will take medications as prescribed . Patient will contact provider with any episodes of hypoglycemia . Patient will report any questions or concerns to provider   Please see past updates related to this goal by clicking on the "Past Updates" button in the selected goal         Patient verbalizes understanding of instructions provided today.    Plan:  - Will await call back from patient  Catie Darnelle Maffucci, PharmD, [place] [phone]

## 2019-12-07 NOTE — Patient Outreach (Signed)
[facility]) Care Management  12/07/2019  Edward Schneider March 31, 1956 PO:[id number]  Received the following in basket message from [place] concerning the delivery of Stiolto with [facility] and [place] with [facility] as well as through chart review.  Patient received 1 Stiolto inhaler from [facility]. Confirmed with [facility] that only 1 inhaler was sent. Spoke to [place] who informed order was written for 1 inhalation daily and since each inhaler holds 60 inhalations patient was sent 1 inhaler. He informed they were unable to send more than that as the scripts are written for a 90 days supply . He informed provider can call and change the order as Stiolto is usually dosed 2 inhalations daily. Phone call made to embedded [place] to assist in this matter.  It was also confirmed that patient received [place] and Novolog from [facility] and has picked up the [employer] from the endocrinologists office. Per the [employer], the Ozempic is due for a refill in March. However, it is not certain that patient has picked that up from the provider's office even though it was noted in his chart that it arrived around the 1 week of January.  Since patient assistance has been completed, will route note to embedded [place] that the case will be closed and will remove myself from care team. Will still be available to consult as needed.  Slater Mcmanaman P. Seaira Byus, [place] [facility] [phone]

## 2019-12-07 NOTE — Chronic Care Management (AMB) (Signed)
Chronic Care Management   Follow Up Note   12/07/2019 Name: Edward Schneider. MRN: [id number] DOB: Jul 08, 1956  Referred by: Guadalupe Maple, MD Reason for referral : Chronic Care Management (Medication Management)   Darrol Brandenburg. is a 64 y.o. year old male who is a primary care patient of Crissman, Jeannette How, MD. The CCM team was consulted for assistance with chronic disease management and care coordination needs.    Care coordination completed today.   Review of patient status, including review of consultants reports, relevant laboratory and other test results, and collaboration with appropriate care team members and the patient's provider was performed as part of comprehensive patient evaluation and provision of chronic care management services.    SDOH (Social Determinants of Health) assessments performed: No    Outpatient Encounter Medications as of 12/07/2019  Medication Sig Note  . ACCU-CHEK AVIVA PLUS test strip USE 1 STRIP TO CHECK GLUCOSE ONCE DAILY   . Accu-Chek Softclix Lancets lancets USE 1  TO CHECK GLUCOSE ONCE DAILY   . albuterol (VENTOLIN HFA) 108 (90 Base) MCG/ACT inhaler Inhale 2 puffs into the lungs every 6 (six) hours as needed for wheezing or shortness of breath.   . Ascorbic Acid (VITAMIN C) 1000 MG tablet Take 1,000 mg by mouth daily.   Marland Kitchen aspirin EC 81 MG EC tablet Take 1 tablet (81 mg total) by mouth daily.   Marland Kitchen atorvastatin (LIPITOR) 40 MG tablet Take 1 tablet (40 mg total) by mouth daily.   . benazepril (LOTENSIN) 20 MG tablet Take 10 mg by mouth daily.   . calcium carbonate (OS-CAL) 600 MG TABS tablet Take 600 mg by mouth daily.   . cephALEXin (KEFLEX) 500 MG capsule Take 500 mg by mouth 2 (two) times daily.   . Cholecalciferol (VITAMIN D3) 25 MCG (1000 UT) CHEW Chew 1 capsule by mouth daily.    Marland Kitchen CINNAMON PO Take 1,000 mg by mouth 2 (two) times daily.    . clopidogrel (PLAVIX) 75 MG tablet Take 1 tablet (75 mg total) by mouth daily with breakfast.   .  cyclobenzaprine (FLEXERIL) 10 MG tablet Take 1 tablet (10 mg total) by mouth 3 (three) times daily as needed for muscle spasms. (Patient not taking: Reported on 12/06/2019)   . diclofenac sodium (VOLTAREN) 1 % GEL Apply 2 g topically 4 (four) times daily.   . diphenhydrAMINE (BENADRYL) 25 mg capsule Take 25 mg by mouth 2 (two) times daily.    Marland Kitchen doxepin (SINEQUAN) 25 MG capsule Take 50 mg by mouth at bedtime. 09/26/2019: Taking BID  . DULoxetine (CYMBALTA) 30 MG capsule LIMIT 1 2 CAPSULES BY MOUTH PER DAY OF TOLERATED 09/26/2019: 30 mg QAM, 30 mg mid-day, 60 mg HS  . DULoxetine (CYMBALTA) 60 MG capsule Take 1 capsule (60 mg total) by mouth daily.   Marland Kitchen EPINEPHrine 0.3 mg/0.3 mL IJ SOAJ injection Inject 0.3 mLs (0.3 mg total) into the muscle as needed for anaphylaxis. (Patient not taking: Reported on 12/06/2019)   . Flaxseed, Linseed, (FLAX SEED OIL) 1000 MG CAPS Take 1,000 mg by mouth daily.   . Ginger, Zingiber officinalis, (GINGER PO) Take 1 Dose by mouth daily.   . Ginseng 100 MG CAPS Take by mouth.   . insulin aspart (NOVOLOG) 100 UNIT/ML FlexPen Inject 14 Units into the skin daily with breakfast AND 13 Units daily with lunch AND 13 Units daily with supper.   . insulin degludec (TRESIBA FLEXTOUCH) 100 UNIT/ML SOPN FlexTouch Pen Inject 0.24  mLs (24 Units total) into the skin daily.   . Insulin Pen Needle (B-D UF III MINI PEN NEEDLES) 31G X 5 MM MISC Daily   . isosorbide mononitrate (IMDUR) 60 MG 24 hr tablet Take 1.5 tablets (90 mg total) by mouth daily.   . Multiple Vitamins-Minerals (HAIR SKIN AND NAILS FORMULA PO) Take 1 capsule by mouth daily.   . mupirocin ointment (BACTROBAN) 2 % APPLY OINTMENT TOPICALLY TWICE DAILY   . NARCAN 4 MG/0.1ML LIQD nasal spray kit CALL 911. ADMINISTER A SINGLE SPRAY OF NARCAN IN ONE NOSTRIL. REPEAT EVERY 3 MINUTES AS NEEDED IF NO OR MINIMAL RESPONSE.   . nitroGLYCERIN (NITROSTAT) 0.4 MG SL tablet Place 1 tablet (0.4 mg total) under the tongue every 5 (five) minutes as  needed for chest pain. (Patient not taking: Reported on 12/06/2019)   . Omega-3 1000 MG CAPS Take 1,000 mg by mouth daily.    Marland Kitchen oxyCODONE (ROXICODONE) 15 MG immediate release tablet TAKE 1/2 TO 1 (ONE HALF TO ONE) TABLET BY MOUTH FOUR TO SIX TIMES DAILY IF TOLERATED NOTE TABLET IS 15 MG 09/26/2019: Taking 6 tab per day  . pantoprazole (PROTONIX) 40 MG tablet Take 1 tablet (40 mg total) by mouth 2 (two) times daily.   . pregabalin (LYRICA) 50 MG capsule TAKE 1 CAPSULE BY MOUTH THREE TIMES DAILY   . Semaglutide,0.25 or 0.5MG/DOS, (OZEMPIC, 0.25 OR 0.5 MG/DOSE,) 2 MG/1.5ML SOPN Inject 0.5 mg into the skin once a week.   . sucralfate (CARAFATE) 1 g tablet TAKE 1 TABLET BY MOUTH 4 TIMES DAILY... WITH MEALS AND AT BEDTIME   . traZODone (DESYREL) 100 MG tablet Take 1 tablet (100 mg total) by mouth at bedtime as needed for sleep.   Marland Kitchen triamcinolone cream (KENALOG) 0.1 % Apply 1 application topically 2 (two) times daily.   Marland Kitchen umeclidinium-vilanterol (ANORO ELLIPTA) 62.5-25 MCG/INH AEPB Inhale 1 puff into the lungs daily.   . vitamin A 10000 UNIT capsule Take 10,000 Units by mouth daily.   . vitamin B-12 (CYANOCOBALAMIN) 1000 MCG tablet Take 1,000 mcg by mouth daily.   . Vitamin E 400 units TABS Take 400 Units by mouth daily.     No facility-administered encounter medications on file as of 12/07/2019.     Objective:   Goals Addressed            This Visit's Progress     Patient Stated   . PharmD "I want to stay healthy" (pt-stated)       Current Barriers:  . Diabetes: uncontrolled but significantly improved, complicated by chronic medical conditions including atherosclerosis (s/p DES to LAD on 11/17), most recent A1c 7.9%; follows w/ Dr. Kelton Pillar @ [facility] . Current antihyperglycemic regimen: Tresiba 24 units daily, Novolog 14 units with breakfast, 13 units TID with lunch or supper; Ozempic 0.5 weekly o Notes he has 1 Ozempic pen at home. Ozempic last filled by [facility] in December,  shipped in January. Unsure if patient ever picked that up o Hx GI intolerance to metformin o Fastings 108-136 . Cardiovascular risk reduction- s/p DES on 09/03/2019; follows w/ Dr. Saunders Revel; saw PA Mickle Plumb yesterday o Current hypertensive regimen: benazepril 10 mg daily, isosorbide 90 mg daily d/t recent episodes of chest pain. o Current hyperlipidemia regimen: atorvastatin 40 mg, to increase to atorvastatin 80 mg daily per lab results today o Current antiplatelet regimen: ASA 81 mg daily, clopidogrel 75 mg daily; plan for DAPT x 12 months, then clopidogrel monotherapy indefinitely. . COPD; current tx  Anoro daily, but approved for [employer] assistance. Demonstrated Stiolto administration technique in clinic yesterday. Patient reported he only received 1 inhaler of Stiolto . Chronic pain- follows w/ Dr. Primus Bravo o Doxepin 25 mg BID, duloxetine 30 mg QAM, 30 mg Qafternoon, 60 mg QPM; pregabalin 50 mg TID, oxycodone IR 15 mg Q4H; cyclobenzaprine 10 mg TID, diclofenac gel PRN,  . Allergies/skin conditions: o Continues on cephalexin 500 mg BID d/t serious ant bites that have gotten infected o Using diphenhydramine 25 mg BID   Pharmacist Clinical Goal(s):  Marland Kitchen Over the next 90 days, patient will work with PharmD and primary care provider to address optimized medication management  Interventions: . Communicated w/ CMA [employer]. She will check and see if there is extra Ozempic that was Mr. Misener supply when she is at hte office next . Collaborated w/ Sharee Pimple [employer]. She called [facility], they noted that the Stiolto prescription was written for 1 puff daily - should be 2 puffs. They only dispensed patient 1 inhaler, as 1 inhaler has 60 puffs, so 60 day supply w/ incorrect prescription, and they cannot dispense more than 90 days at a time. I called, provided a clarified prescription for 2 puffs daily to the pharmacy. They are going to update and ship 3 inhalers, 90 day supply . Contacted patient to follow up on Stiolto  administration, reiterate 2 puffs daily instructions, and review that a 90 day supply was coming to his house. Left voicemail for patient to return my call at his convenience.   Patient Self Care Activities:  . Patient will check blood glucose BID , document, and provide at future appointments . Patient will take medications as prescribed . Patient will contact provider with any episodes of hypoglycemia . Patient will report any questions or concerns to provider   Please see past updates related to this goal by clicking on the "Past Updates" button in the selected goal          Plan:  - Will await call back from patient  Catie Darnelle Maffucci, PharmD, [place] [phone]

## 2019-12-08 ENCOUNTER — Telehealth: Payer: Self-pay

## 2019-12-08 ENCOUNTER — Telehealth: Payer: Self-pay | Admitting: Pharmacist

## 2019-12-08 MED ORDER — ATORVASTATIN CALCIUM 80 MG PO TABS: 80.0000 mg | ORAL_TABLET | Freq: Every day | ORAL | 3 refills | Status: DC

## 2019-12-08 NOTE — Telephone Encounter (Signed)
This is great news.

## 2019-12-08 NOTE — Telephone Encounter (Signed)
Pt stopped by the office last week to pick up samples and his ozempic was not given to him at that time. Lft vm informing pt that the samples are here and ready for pick up.

## 2019-12-08 NOTE — Telephone Encounter (Signed)
Noted.   Copied from [place] [phone]. Topic: General - Inquiry >> Dec 08, 2019 11:33 AM Lennox Solders wrote: Reason for CRM: pt is calling to let catie know he got his inhaler to work

## 2019-12-08 NOTE — Telephone Encounter (Signed)
Spoke to patient. Made him aware of lab results and POC.   Pt verbalized understanding. [facility] in 6-8 weeks.   Updated RX per request.   Pt has no further questions at this time.   Advised pt to call for any further questions or concerns.

## 2019-12-12 ENCOUNTER — Telehealth: Payer: Self-pay

## 2019-12-14 ENCOUNTER — Other Ambulatory Visit: Payer: Self-pay

## 2019-12-14 ENCOUNTER — Ambulatory Visit (INDEPENDENT_AMBULATORY_CARE_PROVIDER_SITE_OTHER): Payer: Medicare Other

## 2019-12-14 ENCOUNTER — Other Ambulatory Visit: Payer: Medicare Other

## 2019-12-14 DIAGNOSIS — R42 Dizziness and giddiness: Secondary | ICD-10-CM

## 2019-12-14 DIAGNOSIS — R079 Chest pain, unspecified: Secondary | ICD-10-CM

## 2019-12-14 MED ORDER — PERFLUTREN LIPID MICROSPHERE
1.0000 mL | INTRAVENOUS | Status: AC | PRN
  Administered 2019-12-14: 2 mL via INTRAVENOUS

## 2019-12-18 ENCOUNTER — Telehealth: Payer: Self-pay

## 2019-12-18 ENCOUNTER — Telehealth: Payer: Self-pay | Admitting: Internal Medicine

## 2019-12-18 NOTE — Telephone Encounter (Signed)
Duplicate encounter     Closing this one.

## 2019-12-18 NOTE — Telephone Encounter (Signed)
No answer. Left message to call back.   

## 2019-12-18 NOTE — Telephone Encounter (Signed)
I spoke with the patient regarding his echo/ carotid US results.

## 2019-12-18 NOTE — Telephone Encounter (Signed)
Patient calling to discuss recent testing results  ° °Please call  ° °

## 2019-12-18 NOTE — Telephone Encounter (Signed)
Arvil Chaco, PA-C  12/18/2019 6:52 AM EST    Please let Edward Schneider know that his EF or pump function of his heart is normal to slightly above at 60-65%. The walls of his heart are moving normally. The chambers of his heart are of normal size and with normal pressures. His valves are functioning normally. His heart is slightly stiff, compared with his previous echo, which can hapen with elevated blood pressure over time. Please have him continue to monitor his BP at home, especially as we lowered his benazepril at his last appointment to allow for increase of his Imdur given his softer pressures. Overall, however, this is a very reassuring study!   Arvil Chaco, PA-C  12/18/2019 6:51 AM EST    Good news! Please let Edward Schneider know that his bilateral internal carotid arteries showed 1-39% stenosis and his extrernal carotids less than 50% stenosis. His common carotida arteries did not have signficant plaque. He had normal flow in his bilateral subclavian arteries and his vertebral arteries. This is good news, and this is a very reassuring study. Please have him continue monitor his BP at home, so we can see if this is contributing to his dizziness at follow-up.

## 2019-12-18 NOTE — Telephone Encounter (Signed)
Patient calling in wanting to know his results from his tests last week  Please advise when able

## 2019-12-18 NOTE — Telephone Encounter (Signed)
Attempted to call the patient. No answer- No DPR on file to leave a detailed message- I left a message to please call back for results.

## 2019-12-25 ENCOUNTER — Ambulatory Visit: Payer: Medicare Other | Admitting: Physician Assistant

## 2019-12-25 DIAGNOSIS — M545 Low back pain: Secondary | ICD-10-CM | POA: Diagnosis not present

## 2019-12-25 DIAGNOSIS — M5136 Other intervertebral disc degeneration, lumbar region: Secondary | ICD-10-CM | POA: Diagnosis not present

## 2019-12-25 DIAGNOSIS — G894 Chronic pain syndrome: Secondary | ICD-10-CM | POA: Diagnosis not present

## 2019-12-26 ENCOUNTER — Other Ambulatory Visit: Payer: Self-pay

## 2019-12-26 ENCOUNTER — Encounter: Payer: Self-pay | Admitting: Physician Assistant

## 2019-12-26 ENCOUNTER — Ambulatory Visit (INDEPENDENT_AMBULATORY_CARE_PROVIDER_SITE_OTHER): Payer: Medicare Other | Admitting: Physician Assistant

## 2019-12-26 VITALS — BP 120/70 | HR 84 | Ht 68.0 in | Wt 250.0 lb

## 2019-12-26 DIAGNOSIS — I10 Essential (primary) hypertension: Secondary | ICD-10-CM | POA: Diagnosis not present

## 2019-12-26 DIAGNOSIS — E785 Hyperlipidemia, unspecified: Secondary | ICD-10-CM

## 2019-12-26 DIAGNOSIS — I5033 Acute on chronic diastolic (congestive) heart failure: Secondary | ICD-10-CM

## 2019-12-26 DIAGNOSIS — E1169 Type 2 diabetes mellitus with other specified complication: Secondary | ICD-10-CM

## 2019-12-26 DIAGNOSIS — Z87898 Personal history of other specified conditions: Secondary | ICD-10-CM | POA: Diagnosis not present

## 2019-12-26 DIAGNOSIS — I2510 Atherosclerotic heart disease of native coronary artery without angina pectoris: Secondary | ICD-10-CM | POA: Diagnosis not present

## 2019-12-26 DIAGNOSIS — I25118 Atherosclerotic heart disease of native coronary artery with other forms of angina pectoris: Secondary | ICD-10-CM

## 2019-12-26 DIAGNOSIS — Z794 Long term (current) use of insulin: Secondary | ICD-10-CM

## 2019-12-26 DIAGNOSIS — G4733 Obstructive sleep apnea (adult) (pediatric): Secondary | ICD-10-CM

## 2019-12-26 MED ORDER — FUROSEMIDE 20 MG PO TABS: 20.0000 mg | ORAL_TABLET | Freq: Every day | ORAL | 5 refills | Status: DC
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
## 2019-12-26 NOTE — Patient Instructions (Addendum)
Medication Instructions:  Your physician has recommended you make the following change in your medication:   START Lasix 20 mg daily. An Rx has been sent to your pharmacy.  *If you need a refill on your cardiac medications before your next appointment, please call your pharmacy*   Lab Work: [place] and [place] today.   Your physician recommends that you return for lab work in: 1 week (B[facility], Engineer, materials)  Please have your lab drawn at the medical mall. You do not need an appointment.  If you have labs (blood work) drawn today and your tests are completely normal, you will receive your results only by: Marland Kitchen MyChart Message (if you have MyChart) OR . A paper copy in the mail If you have any lab test that is abnormal or we need to change your treatment, we will call you to review the results.   Testing/Procedures: None ordered   Follow-Up: At [facility], you and your health needs are our priority.  As part of our continuing mission to provide you with exceptional heart care, we have created designated Provider Care Teams.  These Care Teams include your primary Cardiologist (physician) and Advanced Practice Providers (APPs -  Physician Assistants and Nurse Practitioners) who all work together to provide you with the care you need, when you need it.  We recommend signing up for the patient portal called "MyChart".  Sign up information is provided on this After Visit Summary.  MyChart is used to connect with patients for Virtual Visits (Telemedicine).  Patients are able to view lab/test results, encounter notes, upcoming appointments, etc.  Non-urgent messages can be sent to your provider as well.   To learn more about what you can do with MyChart, go to [email or web address].    Your next appointment:   1 month(s)  The format for your next appointment:   In Person  Provider:    You may see Nelva Bush, MD or one of the following Advanced Practice Providers on your designated Care Team:     Murray Hodgkins, NP  Christell Faith, PA-C  Marrianne Mood, PA-C    Other Instructions N/A

## 2019-12-27 LAB — BASIC METABOLIC PANEL
BUN/Creatinine Ratio: 12 (ref 10–24)
BUN: 16 mg/dL (ref 8–27)
CO2: 20 mmol/L (ref 20–29)
Calcium: 9.7 mg/dL (ref 8.6–10.2)
Chloride: 102 mmol/L (ref 96–106)
Creatinine, Ser: 1.35 mg/dL — ABNORMAL HIGH (ref 0.76–1.27)
GFR calc Af Amer: 65 mL/min/{1.73_m2} (ref >59)
GFR calc non Af Amer: 56 mL/min/{1.73_m2} — ABNORMAL LOW (ref >59)
Glucose: 255 mg/dL — ABNORMAL HIGH (ref 65–99)
Potassium: 4.8 mmol/L (ref 3.5–5.2)
Sodium: 137 mmol/L (ref 134–144)

## 2019-12-27 LAB — MAGNESIUM: Magnesium: 1.6 mg/dL (ref 1.6–2.3)

## 2019-12-28 ENCOUNTER — Encounter: Payer: Self-pay | Admitting: Nurse Practitioner

## 2019-12-28 ENCOUNTER — Ambulatory Visit: Payer: Medicare Other | Admitting: Podiatry

## 2019-12-28 ENCOUNTER — Other Ambulatory Visit: Payer: Self-pay

## 2019-12-28 ENCOUNTER — Ambulatory Visit: Payer: Self-pay | Admitting: *Deleted

## 2019-12-28 ENCOUNTER — Telehealth: Payer: Self-pay

## 2019-12-28 ENCOUNTER — Telehealth (INDEPENDENT_AMBULATORY_CARE_PROVIDER_SITE_OTHER): Payer: Medicare Other | Admitting: Nurse Practitioner

## 2019-12-28 ENCOUNTER — Encounter: Payer: Self-pay | Admitting: Podiatry

## 2019-12-28 ENCOUNTER — Telehealth: Payer: Self-pay | Admitting: Internal Medicine

## 2019-12-28 VITALS — BP 135/78 | HR 88

## 2019-12-28 DIAGNOSIS — M79675 Pain in left toe(s): Secondary | ICD-10-CM

## 2019-12-28 DIAGNOSIS — M79674 Pain in right toe(s): Secondary | ICD-10-CM | POA: Diagnosis not present

## 2019-12-28 DIAGNOSIS — E1142 Type 2 diabetes mellitus with diabetic polyneuropathy: Secondary | ICD-10-CM

## 2019-12-28 DIAGNOSIS — I952 Hypotension due to drugs: Secondary | ICD-10-CM | POA: Diagnosis not present

## 2019-12-28 DIAGNOSIS — B351 Tinea unguium: Secondary | ICD-10-CM

## 2019-12-28 NOTE — Progress Notes (Signed)
This patient returns to my office for at risk foot care.  This patient requires this care by a professional since this patient will be at risk due to having This patient is unable to cut nails themselves since the patient cannot reach their nails.These nails are painful walking and wearing shoes.  This patient presents for at risk foot care today.  General Appearance  Alert, conversant and in no acute stress.  Vascular  Dorsalis pedis and posterior tibial  pulses are palpable  bilaterally.  Capillary return is within normal limits  bilaterally. Temperature is within normal limits  bilaterally.  Neurologic  Senn-Weinstein monofilament wire test within normal limits  bilaterally. Muscle power within normal limits bilaterally.  Nails Thick disfigured discolored nails with subungual debris  from hallux to fifth toes bilaterally. No evidence of bacterial infection or drainage bilaterally.  Orthopedic  No limitations of motion  feet .  No crepitus or effusions noted.  No bony pathology or digital deformities noted.  Skin  normotropic skin with no porokeratosis noted bilaterally.  No signs of infections or ulcers noted.   No fissures.  Healed.  Onychomycosis  Pain in right toes  Pain in left toes  Consent was obtained for treatment procedures.   Mechanical debridement of nails 1-5  bilaterally performed with a nail nipper.  Filed with dremel without incident. No infection or ulcer.     Return office visit    10 weeks      Told patient to return for periodic foot care and evaluation due to potential at risk complications.   Gardiner Barefoot DPM

## 2019-12-28 NOTE — Telephone Encounter (Signed)
Tried calling patient but no answer. Left message on patients voicemail to please return our call.

## 2019-12-28 NOTE — Telephone Encounter (Signed)
Patient reports this present reading in the highest all week- patient was placed on diuretic by heart doctors( yesterday). Patient states he felt bad yesterday and his BP was extremely low- he can not remember exact reading- states 50 was "top" number. Advised patient of the dangers of low BP and when he should seek immediate help- BP has come up today- patient advised if he has memory in BP machine to record readings and appointment has been scheduled.  Note: Patient also mentioned vomiting at night during sleep and waking up with vomit  in nostrils and throat- this is scaring him- advised would make note for PCP.   Reason for Disposition . [id number] Systolic BP [id number] AND [id number] taking blood pressure medications AND [3] dizzy, lightheaded or weak  Answer Assessment - Initial Assessment Questions 1. BLOOD PRESSURE: "What is the blood pressure?" "Did you take at least two measurements 5 minutes apart?"     110/87, 135/82 2. ONSET: "When did you take your blood pressure?"     9:15,  9:25 3. HOW: "How did you obtain the blood pressure?" (e.g., visiting nurse, automatic home BP monitor)     Auto cuff- arm 4. HISTORY: "Do you have a history of low blood pressure?" "What is your blood pressure normally?"     No- 80/64 5. MEDICATIONS: "Are you taking any medications for blood pressure?" If yes: "Have they been changed recently?"     Yes- patient was placed on diuretic this week 6. PULSE RATE: "Do you know what your pulse rate is?"      93, 88 7. OTHER SYMPTOMS: "Have you been sick recently?" "Have you had a recent injury?"     Weakness, dizziness all day yesterday 8. PREGNANCY: "Is there any chance you are pregnant?" "When was your last menstrual period?"     n/a  Protocols used: LOW BLOOD PRESSURE-A-AH

## 2019-12-28 NOTE — Progress Notes (Addendum)
BP 135/78   Pulse 88    Subjective:    Patient ID: Edward Flesher., male    DOB: June 24, 1956, 64 y.o.   MRN: [id number]  HPI: Edward Dumont. is a 64 y.o. male  Chief Complaint  Patient presents with  . Hypotension    pt states his BP was reading low this morning, states the systolic was in the [id number]    . This visit was completed via telephone due to the restrictions of the COVID-19 pandemic. All issues as above were discussed and addressed but no physical exam was performed. If it was felt that the patient should be evaluated in the office, they were directed there. The patient verbally consented to this visit. Patient was unable to complete an audio/visual visit due to Technical difficulties,Lack of internet. Due to the catastrophic nature of the COVID-19 pandemic, this visit was done through audio contact only. . Location of the patient: home . Location of the provider: home . Those involved with this call:  . Provider: Marnee Guarneri, DNP . CMA: Yvonna Alanis, CMA . Front Desk/Registration: Don Perking  . Time spent on call: 15 minutes on the phone discussing health concerns. 10 minutes total spent in review of patient's record and preparation of their chart.  . I verified patient identity using two factors (patient name and date of birth). Patient consents verbally to being seen via telemedicine visit today.    HYPOTENSION Reports that yesterday his blood pressure dropped to 33/56 in the morning, this was after he saw cardiology and after he took first dose of Lasix.  This morning it was 76/65, then repeated and last one was 135/77.  States he was feeling dizzy and light headed yesterday, but not today.  He did have nausea and reports he did have emesis in his sleep that came out through his nose, did not check his sugar.  Reports he is feeling better today and no further symptoms.  He has not taken Lasix today.    Was seen by cardiology yesterday and started on  Lasix 20 MG daily on review of note. Prior visit with them on 12/05/2019 his Imdur was increased to 90 MG and Benazepril decreased to 10 MG.  On review plan is to defer BB due to history of syncope and bradycardia on BB and his current 1st degree AV block.  He has underlying chronic, intermittent dizziness with position changes such as bending over to pick up object.    On review plan per cardiology if symptoms of dizziness worsen is to stop Lasix and instead add low dose Spironolactone.  He was to call their office if low BP readings or increased dizziness with Lasix.  Diuretic therapy started due to mild volume overload being noted at their visit with him yesterday.  Has underlying HFpEF, Grade 1 diastolic dysfunction.    He continues on Benazepril 10 MG daily, Plavix 75 MG daily, ASA, Imdur 90 MG daily. Hypertension status: exacerbated  Satisfied with current treatment? no Duration of hypertension: chronic BP monitoring frequency:  daily BP range: 120-130/70 on average with recent lows with addition of Lasix BP medication side effects:  yes Medication compliance: good compliance Aspirin: yes Recurrent headaches: no Visual changes: no Palpitations: no Dyspnea: no Chest pain: no Lower extremity edema: yes Dizzy/lightheaded: yes  Relevant past medical, surgical, family and social history reviewed and updated as indicated. Interim medical history since our last visit reviewed. Allergies and medications reviewed and updated.  Review  of Systems  Constitutional: Negative for activity change, diaphoresis, fatigue and fever.  Respiratory: Negative for cough, chest tightness, shortness of breath and wheezing.   Cardiovascular: Positive for leg swelling. Negative for chest pain and palpitations.  Gastrointestinal: Negative.   Neurological: Positive for dizziness and light-headedness. Negative for syncope, weakness, numbness and headaches.  Psychiatric/Behavioral: Negative.     Per HPI unless  specifically indicated above     Objective:    BP 135/78   Pulse 88   Wt Readings from Last 3 Encounters:  12/26/19 250 lb (113.4 kg)  12/05/19 246 lb 12 oz (111.9 kg)  10/30/19 242 lb 8 oz (110 kg)    Physical Exam   Unable to perform due to telephone visit only.  Results for orders placed or performed in visit on [id number]  Basic metabolic panel  Result Value Ref Range   Glucose 255 (H) 65 - 99 mg/dL   BUN 16 8 - 27 mg/dL   Creatinine, Ser 1.35 (H) 0.76 - 1.27 mg/dL   GFR calc non Af Amer 56 (L) >59 mL/min/1.73   GFR calc Af Amer 65 >59 mL/min/1.73   BUN/Creatinine Ratio 12 10 - 24   Sodium 137 134 - 144 mmol/L   Potassium 4.8 3.5 - 5.2 mmol/L   Chloride 102 96 - 106 mmol/L   CO2 20 20 - 29 mmol/L   Calcium 9.7 8.6 - 10.2 mg/dL  Magnesium  Result Value Ref Range   Magnesium 1.6 1.6 - 2.3 mg/dL      Assessment & Plan:   Problem List Items Addressed This Visit      Cardiovascular and Mediastinum   Hypotension due to drugs - Primary    Acute with initiation of Lasix yesterday.  Advised him to hold Lasix today and reach out to cardiology for next steps, as they had advised in their note from yesterday.  He agrees with this plan.  If worsening symptoms he is immediately to go to ER for further evaluation and face to face visit.  Sent secure chat message to cardiology provider to alert her of patient low BP with Lasix and PCP recommendations provided to patient today.           I discussed the assessment and treatment plan with the patient. The patient was provided an opportunity to ask questions and all were answered. The patient agreed with the plan and demonstrated an understanding of the instructions.   The patient was advised to call back or seek an in-person evaluation if the symptoms worsen or if the condition fails to improve as anticipated.   I provided 15+ minutes of time during this encounter.  Follow up plan: Return if symptoms worsen or fail to  improve.

## 2019-12-28 NOTE — Telephone Encounter (Addendum)
-----   Message from Arvil Chaco, PA-C sent at 12/28/2019 10:52 AM EST ----- Patient's PCP just reached out to me an informed me that he is dizzy on his lasix. Please let him know that I agree with her recommendation regarding holding the lasix and will reach out to him later today or early tomorrow with further recommendations at that time. He should still take the magnesium repletion, however.  Please let Edward Schneider know that his magnesium is low at 1.6. Let's have him start magnesium oxide 400mg  BID and we will recheck his magnesium in 1 week as already scheduled. His renal function showed a slight bump, which is likely in the setting of his elevated volume status. We will plan to recheck in 1 week on the lasix as scheduled. His potassium is on the higher side but still within range and we will monitor it at recheck. Please let me know if any questions.

## 2019-12-28 NOTE — Telephone Encounter (Signed)
Blood pressure of 33/47 is not physiologic.  Other recent readings appear to have been labile.  I recommend that he continue to hold furosemide and increase his water intake.  He should minimize his salt consumption.  If his blood pressure remains consistently low (<90/60) and he has symptoms, I would suggest decreasing isosorbide mononitrate back down to 60 mg daily.  Nelva Bush, MD [facility]

## 2019-12-28 NOTE — Patient Instructions (Signed)
Hypotension As your heart beats, it forces blood through your body. This force is called blood pressure. If you have hypotension, you have low blood pressure. When your blood pressure is too low, you may not get enough blood to your brain or other parts of your body. This may cause you to feel weak, light-headed, have a fast heartbeat, or even pass out (faint). Low blood pressure may be harmless, or it may cause serious problems. What are the causes?  Blood loss.  Not enough water in the body (dehydration).  Heart problems.  Hormone problems.  Pregnancy.  A very bad infection.  Not having enough of certain nutrients.  Very bad allergic reactions.  Certain medicines. What increases the risk?  Age. The risk increases as you get older.  Conditions that affect the heart or the brain and spinal cord (central nervous system).  Taking certain medicines.  Being pregnant. What are the signs or symptoms?  Feeling: ? Weak. ? Light-headed. ? Dizzy. ? Tired (fatigued).  Blurred vision.  Fast heartbeat.  Passing out, in very bad cases. How is this treated?  Changing your diet. This may involve eating more salt (sodium) or drinking more water.  Taking medicines to raise your blood pressure.  Changing how much you take (the dosage) of some of your medicines.  Wearing compression stockings. These stockings help to prevent blood clots and reduce swelling in your legs. In some cases, you may need to go to the hospital for:  Fluid replacement. This means you will receive fluids through an IV tube.  Blood replacement. This means you will receive donated blood through an IV tube (transfusion).  Treating an infection or heart problems, if this applies.  Monitoring. You may need to be monitored while medicines that you are taking wear off. Follow these instructions at home: Eating and drinking   Drink enough fluids to keep your pee (urine) pale yellow.  Eat a healthy diet.  Follow instructions from your doctor about what you can eat or drink. A healthy diet includes: ? Fresh fruits and vegetables. ? Whole grains. ? Low-fat (lean) meats. ? Low-fat dairy products.  Eat extra salt only as told. Do not add extra salt to your diet unless your doctor tells you to.  Eat small meals often.  Avoid standing up quickly after you eat. Medicines  Take over-the-counter and prescription medicines only as told by your doctor. ? Follow instructions from your doctor about changing how much you take of your medicines, if this applies. ? Do not stop or change any of your medicines on your own. General instructions   Wear compression stockings as told by your doctor.  Get up slowly from lying down or sitting.  Avoid hot showers and a lot of heat as told by your doctor.  Return to your normal activities as told by your doctor. Ask what activities are safe for you.  Do not use any products that contain nicotine or tobacco, such as cigarettes, e-cigarettes, and chewing tobacco. If you need help quitting, ask your doctor.  Keep all follow-up visits as told by your doctor. This is important. Contact a doctor if:  You throw up (vomit).  You have watery poop (diarrhea).  You have a fever for more than 2-3 days.  You feel more thirsty than normal.  You feel weak and tired. Get help right away if:  You have chest pain.  You have a fast or uneven heartbeat.  You lose feeling (have numbness) in any   part of your body.  You cannot move your arms or your legs.  You have trouble talking.  You get sweaty or feel light-headed.  You pass out.  You have trouble breathing.  You have trouble staying awake.  You feel mixed up (confused). Summary  Hypotension is also called low blood pressure. It is when the force of blood pumping through your arteries is too weak.  Hypotension may be harmless, or it may cause serious problems.  Treatment may include changing  your diet and medicines, and wearing compression stockings.  In very bad cases, you may need to go to the hospital. This information is not intended to replace advice given to you by your health care provider. Make sure you discuss any questions you have with your health care provider. Document Revised: 03/31/2018 Document Reviewed: 03/31/2018 Elsevier Patient Education  2020 [employer]  

## 2019-12-28 NOTE — Telephone Encounter (Signed)
Pt c/o medication issue:  1. Name of Medication: furosemide  2. How are you currently taking this medication (dosage and times per day)? 20 mg once a day started yesterday  3. Are you having a reaction (difficulty breathing--STAT)?   4. What is your medication issue? Dizzy and decrease in BP. Patient states he BP was 33/47, by house machine. Patient went to PCP today and he told patient to stop taking medication and call in. Patient has not taken medication today, only Wednesday   Please advise

## 2019-12-28 NOTE — Telephone Encounter (Signed)
Left message on patients voicemail to please return our call.   

## 2019-12-28 NOTE — Assessment & Plan Note (Signed)
Acute with initiation of Lasix yesterday.  Advised him to hold Lasix today and reach out to cardiology for next steps, as they had advised in their note from yesterday.  He agrees with this plan.  If worsening symptoms he is immediately to go to ER for further evaluation and face to face visit.  Sent secure chat message to cardiology provider to alert her of patient low BP with Lasix and PCP recommendations provided to patient today.

## 2020-01-01 ENCOUNTER — Other Ambulatory Visit: Payer: Self-pay

## 2020-01-01 ENCOUNTER — Ambulatory Visit (INDEPENDENT_AMBULATORY_CARE_PROVIDER_SITE_OTHER): Payer: Medicare Other | Admitting: Nurse Practitioner

## 2020-01-01 ENCOUNTER — Encounter: Payer: Self-pay | Admitting: Nurse Practitioner

## 2020-01-01 VITALS — BP 132/85 | HR 79 | Temp 97.6°F | Ht 68.0 in | Wt 250.0 lb

## 2020-01-01 DIAGNOSIS — I10 Essential (primary) hypertension: Secondary | ICD-10-CM

## 2020-01-01 DIAGNOSIS — G8929 Other chronic pain: Secondary | ICD-10-CM

## 2020-01-01 DIAGNOSIS — M25561 Pain in right knee: Secondary | ICD-10-CM

## 2020-01-01 DIAGNOSIS — E1159 Type 2 diabetes mellitus with other circulatory complications: Secondary | ICD-10-CM

## 2020-01-01 DIAGNOSIS — I952 Hypotension due to drugs: Secondary | ICD-10-CM

## 2020-01-01 NOTE — Progress Notes (Signed)
BP 132/85   Pulse 79   Temp 97.6 F (36.4 C) (Oral)   Ht 5\' 8"  (1.727 m)   Wt 250 lb (113.4 kg)   SpO2 96%   BMI 38.01 kg/m    Subjective:    Patient ID: Edward Flesher., male    DOB: 08-Dec-1955, 64 y.o.   MRN: [id number]  HPI: Edward Debold. is a 64 y.o. male  Chief Complaint  Patient presents with  . Follow-up    pt states that he felt sick, throwing up after he started taking the furosemide. pt stopped taking this med   HYPOTENSION Reports improved nausea and BP since stopping Lasix.  He did have nausea and reports he did have emesis in his sleep that came out through his nose, did not check his sugar. He has not taken Lasix since 12/28/2019.  At this time he reports feeling better, no further nausea or low BP readings at home.    Was seen by cardiology last week and started on Lasix 20 MG daily on review of note. Prior visit with them on 12/05/2019 his Imdur was increased to 90 MG and Benazepril decreased to 10 MG.  On review plan is to defer BB due to history of syncope and bradycardia on BB and his current 1st degree AV block.  He has underlying chronic, intermittent dizziness with position changes such as bending over to pick up object.    On review plan per cardiology if symptoms of dizziness worsen is to stop Lasix and instead add low dose Spironolactone.  He was to call their office if low BP readings or increased dizziness with Lasix.  Diuretic therapy started due to mild volume overload being noted at their visit with him yesterday.  Has underlying HFpEF, Grade 1 diastolic dysfunction.  He did call their office on 12/28/2019 and per Dr. Saunders Revel is to continue to hold Lasix and if ongoing low BP suggest decrease Isosorbide Mono to 60 MG daily.  He continues on Benazepril 10 MG daily, Plavix 75 MG daily, ASA, Imdur 90 MG daily. Hypertension status: exacerbated  Satisfied with current treatment? no Duration of hypertension: chronic BP monitoring frequency:  daily BP  range: 115-135/75-85 on average -- no further lows since stopping Lasix BP medication side effects:  yes Medication compliance: good compliance Aspirin: yes Recurrent headaches: no Visual changes: no Palpitations: no Dyspnea: no Chest pain: no Lower extremity edema: yes Dizzy/lightheaded: yes  RIGHT KNEE REPLACEMENT: Reports he was told he needs a knee replacement, but does not want to go to providers locally.  Would like referral to [facility].  States cardiology said he could hold medications if surgery performed.  Relevant past medical, surgical, family and social history reviewed and updated as indicated. Interim medical history since our last visit reviewed. Allergies and medications reviewed and updated.  Review of Systems  Constitutional: Negative for activity change, diaphoresis, fatigue and fever.  Respiratory: Negative for cough, chest tightness, shortness of breath and wheezing.   Cardiovascular: Negative for chest pain, palpitations and leg swelling.  Gastrointestinal: Negative.   Endocrine: Negative for polydipsia, polyphagia and polyuria.  Musculoskeletal: Positive for arthralgias.  Skin: Negative.   Neurological: Negative.   Psychiatric/Behavioral: Negative.     Per HPI unless specifically indicated above     Objective:    BP 132/85   Pulse 79   Temp 97.6 F (36.4 C) (Oral)   Ht 5\' 8"  (1.727 m)   Wt 250 lb (113.4 kg)  SpO2 96%   BMI 38.01 kg/m   Wt Readings from Last 3 Encounters:  01/01/20 250 lb (113.4 kg)  12/26/19 250 lb (113.4 kg)  12/05/19 246 lb 12 oz (111.9 kg)    Physical Exam Vitals and nursing note reviewed.  Constitutional:      General: He is awake. He is not in acute distress.    Appearance: He is well-developed. He is morbidly obese. He is not ill-appearing.  HENT:     Head: Normocephalic and atraumatic.     Right Ear: Hearing normal. No drainage.     Left Ear: Hearing normal. No drainage.  Eyes:     General: Lids  are normal.        Right eye: No discharge.        Left eye: No discharge.     Conjunctiva/sclera: Conjunctivae normal.     Pupils: Pupils are equal, round, and reactive to light.  Neck:     Vascular: No carotid bruit.  Cardiovascular:     Rate and Rhythm: Normal rate and regular rhythm.     Heart sounds: Normal heart sounds, S1 normal and S2 normal. No murmur. No gallop.   Pulmonary:     Effort: Pulmonary effort is normal. No accessory muscle usage or respiratory distress.     Breath sounds: Normal breath sounds.     Comments: No wheezes, slightly diminished throughout. Abdominal:     General: Bowel sounds are normal.     Palpations: Abdomen is soft.  Musculoskeletal:        General: Normal range of motion.     Cervical back: Normal range of motion and neck supple.     Right lower leg: No edema.     Left lower leg: No edema.  Skin:    General: Skin is warm and dry.  Neurological:     Mental Status: He is alert and oriented to person, place, and time.  Psychiatric:        Attention and Perception: Attention normal.        Mood and Affect: Mood normal.        Speech: Speech normal.        Behavior: Behavior normal. Behavior is cooperative.    Results for orders placed or performed in visit on [id number]  Basic metabolic panel  Result Value Ref Range   Glucose 255 (H) 65 - 99 mg/dL   BUN 16 8 - 27 mg/dL   Creatinine, Ser 1.35 (H) 0.76 - 1.27 mg/dL   GFR calc non Af Amer 56 (L) >59 mL/min/1.73   GFR calc Af Amer 65 >59 mL/min/1.73   BUN/Creatinine Ratio 12 10 - 24   Sodium 137 134 - 144 mmol/L   Potassium 4.8 3.5 - 5.2 mmol/L   Chloride 102 96 - 106 mmol/L   CO2 20 20 - 29 mmol/L   Calcium 9.7 8.6 - 10.2 mg/dL  Magnesium  Result Value Ref Range   Magnesium 1.6 1.6 - 2.3 mg/dL      Assessment & Plan:   Problem List Items Addressed This Visit      Cardiovascular and Mediastinum   Hypertension associated with diabetes ([place]) - Primary    Chronic, stable with BP at  goal.  Continue current medication regimen and adjust as needed + continue collaboration with cardiology team.  Recommend he continue to monitor BP closely at home.  Return as scheduled in May.      Hypotension due to drugs    Acute  with initiation of Lasix yesterday, improved at this time with no more nausea or low BP readings.  If worsening symptoms he is immediately to go to ER for further evaluation and face to face visit.  Continue collaboration with cardiology.  BP at goal range at this time.       Other Visit Diagnoses    Chronic pain of right knee       Referral to orthopedics in [place] per patient request.   Relevant Orders   Ambulatory referral to Orthopedics       Follow up plan: Return if symptoms worsen or fail to improve, for as scheduled in May -- check this.

## 2020-01-01 NOTE — Telephone Encounter (Signed)
-----   Message from Arvil Chaco, PA-C sent at 01/01/2020 11:52 AM EDT ----- Yes, please. He does not need to start the magnesium if his repeat labs show his Mg is improved. So, let's just plan for no lasix and repeat labs. If he prefers, we can do the repeat labs with his PCP.  ----- Message ----- From: Gita Kudo, RN Sent: 01/01/2020   9:19 AM EDT To: Arvil Chaco, PA-C  Hey, I was unable to reach out to this patient last week as he has not answered the phone or returned our calls. I am going to try to reach out again today. Do you want to continue with the same plan to start the magnesium oxide, hold the Lasix, and re-check labs in one week?  Thanks,  Lilia Pro, RN ----- Message ----- From: Arvil Chaco, PA-C Sent: 12/28/2019  10:52 AM EDT To: Rebeca Alert [facility] Triage  Patient's PCP just reached out to me an informed me that he is dizzy on his lasix. Please let him know that I agree with her recommendation regarding holding the lasix and will reach out to him later today or early tomorrow with further recommendations at that time. He should still take the magnesium repletion, however.

## 2020-01-01 NOTE — Assessment & Plan Note (Addendum)
Chronic, stable with BP at goal.  Continue current medication regimen and adjust as needed + continue collaboration with cardiology team.  Recommend he continue to monitor BP closely at home.  Return as scheduled in May.

## 2020-01-01 NOTE — Patient Instructions (Signed)
Heart Failure Eating Plan Heart failure, also called congestive heart failure, occurs when your heart does not pump blood well enough to meet your body's needs for oxygen-rich blood. Heart failure is a long-term (chronic) condition. Living with heart failure can be challenging. However, following your health care provider's instructions about a healthy lifestyle and working with a diet and nutrition specialist (dietitian) to choose the right foods may help to improve your symptoms. What are tips for following this plan? Reading food labels  Check food labels for the amount of sodium per serving. Choose foods that have less than 140 mg (milligrams) of sodium in each serving.  Check food labels for the number of calories per serving. This is important if you need to limit your daily calorie intake to lose weight.  Check food labels for the serving size. If you eat more than one serving, you will be eating more sodium and calories than what is listed on the label.  Look for foods that are labeled as "sodium-free," "very low sodium," or "low sodium." ? Foods labeled as "reduced sodium" or "lightly salted" may still have more sodium than what is recommended for you. Cooking  Avoid adding salt when cooking. Ask your health care provider or dietitian before using salt substitutes.  Season food with salt-free seasonings, spices, or herbs. Check the label of seasoning mixes to make sure they do not contain salt.  Cook with heart-healthy oils, such as olive, canola, soybean, or sunflower oil.  Do not fry foods. Cook foods using low-fat methods, such as baking, boiling, grilling, and broiling.  Limit unhealthy fats when cooking by: ? Removing the skin from poultry, such as chicken. ? Removing all visible fats from meats. ? Skimming the fat off from stews, soups, and gravies before serving them. Meal planning   Limit your intake of: ? Processed, canned, or pre-packaged foods. ? Foods that are  high in trans fat, such as fried foods. ? Sweets, desserts, sugary drinks, and other foods with added sugar. ? Full-fat dairy products, such as whole milk.  Eat a balanced diet that includes: ? 4-5 servings of fruit each day and 4-5 servings of vegetables each day. At each meal, try to fill half of your plate with fruits and vegetables. ? Up to 6-8 servings of whole grains each day. ? Up to 2 servings of lean meat, poultry, or fish each day. One serving of meat is equal to 3 oz. This is about the same size as a deck of cards. ? 2 servings of low-fat dairy each day. ? Heart-healthy fats. Healthy fats called omega-3 fatty acids are found in foods such as flaxseed and cold-water fish like sardines, salmon, and mackerel.  Aim to eat 25-35 g (grams) of fiber a day. Foods that are high in fiber include apples, broccoli, carrots, beans, peas, and whole grains.  Do not add salt or condiments that contain salt (such as soy sauce) to foods before eating.  When eating at a restaurant, ask that your food be prepared with less salt or no salt, if possible.  Try to eat 2 or more vegetarian meals each week.  Eat more home-cooked food and eat less restaurant, buffet, and fast food. General information  Do not eat more than 2,300 mg of salt (sodium) a day. The amount of sodium that is recommended for you may be lower, depending on your condition.  Maintain a healthy body weight as directed. Ask your health care provider what a healthy weight is  for you. ? Check your weight every day. ? Work with your health care provider and dietitian to make a plan that is right for you to lose weight or maintain your current weight.  Limit how much fluid you drink. Ask your health care provider or dietitian how much fluid you can have each day.  Limit or avoid alcohol as told by your health care provider or dietitian. Recommended foods The items listed may not be a complete list. Talk with your dietitian about what  dietary choices are best for you. Fruits All fresh, frozen, and canned fruits. Dried fruits, such as raisins, prunes, and cranberries. Vegetables All fresh vegetables. Vegetables that are frozen without sauce or added salt. Low-sodium or sodium-free canned vegetables. Grains Bread with less than 80 mg of sodium per slice. Whole-wheat pasta, quinoa, and brown rice. Oats and oatmeal. Barley. [place]. Grits and cream of wheat. Whole-grain and whole-wheat cold cereal. Meats and other protein foods Lean cuts of meat. Skinless chicken and [place]. Fish with high omega-3 fatty acids, such as salmon, sardines, and other cold-water fishes. Eggs. Dried beans, peas, and edamame. Unsalted nuts and nut butters. Dairy Low-fat or nonfat (skim) milk and dried milk. Rice milk, soy milk, and almond milk. Low-fat or nonfat yogurt. Small amounts of reduced-sodium block cheese. Low-sodium cottage cheese. Fats and oils Olive, canola, soybean, flaxseed, or sunflower oil. Avocado. Sweets and desserts Apple sauce. Granola bars. Sugar-free pudding and gelatin. Frozen fruit bars. Seasoning and other foods Fresh and dried herbs. Lemon or lime juice. Vinegar. Low-sodium ketchup. Salt-free marinades, salad dressings, sauces, and seasonings. The items listed above may not be a complete list of foods and beverages you can eat. Contact a dietitian for more information. Foods to avoid The items listed may not be a complete list. Talk with your dietitian about what dietary choices are best for you. Fruits Fruits that are dried with sodium-containing preservatives. Vegetables Canned vegetables. Frozen vegetables with sauce or seasonings. Creamed vegetables. [place] fries. Onion rings. Pickled vegetables and sauerkraut. Grains Bread with more than 80 mg of sodium per slice. Hot or cold cereal with more than 140 mg sodium per serving. Salted pretzels and crackers. Pre-packaged breadcrumbs. Bagels, croissants, and biscuits. Meats  and other protein foods Ribs and chicken wings. Bacon, ham, pepperoni, bologna, salami, and packaged luncheon meats. Hot dogs, bratwurst, and sausage. Canned meat. Smoked meat and fish. Salted nuts and seeds. Dairy Whole milk, half-and-half, and cream. Buttermilk. Processed cheese, cheese spreads, and cheese curds. Regular cottage cheese. Feta cheese. Shredded cheese. String cheese. Fats and oils Butter, lard, shortening, ghee, and bacon fat. Canned and packaged gravies. Seasoning and other foods Onion salt, garlic salt, table salt, and sea salt. Marinades. Regular salad dressings. Relishes, pickles, and olives. Meat flavorings and tenderizers, and bouillon cubes. Horseradish, ketchup, and mustard. Worcestershire sauce. Teriyaki sauce, soy sauce (including reduced sodium). Hot sauce and Tabasco sauce. Steak sauce, fish sauce, oyster sauce, and cocktail sauce. Taco seasonings. Barbecue sauce. Tartar sauce. The items listed above may not be a complete list of foods and beverages you should avoid. Contact a dietitian for more information. Summary  A heart failure eating plan includes changes that limit your intake of sodium and unhealthy fat, and it may help you lose weight or maintain a healthy weight. Your health care provider may also recommend limiting how much fluid you drink.  Most people with heart failure should eat no more than 2,300 mg of salt (sodium) a day. The amount of sodium  that is recommended for you may be lower, depending on your condition.  Contact your health care provider or dietitian before making any major changes to your diet. This information is not intended to replace advice given to you by your health care provider. Make sure you discuss any questions you have with your health care provider. Document Revised: 12/01/2018 Document Reviewed: 02/19/2017 Elsevier Patient Education  2020 Elsevier Inc.  

## 2020-01-01 NOTE — Assessment & Plan Note (Signed)
Acute with initiation of Lasix yesterday, improved at this time with no more nausea or low BP readings.  If worsening symptoms he is immediately to go to ER for further evaluation and face to face visit.  Continue collaboration with cardiology.  BP at goal range at this time.

## 2020-01-01 NOTE — Telephone Encounter (Signed)
Left message on patients voicemail to please return our call.   

## 2020-01-02 NOTE — Telephone Encounter (Signed)
Letter to patient sent below   Dear Mr. Pesch,  Below are the results from your recent visit:  We have been trying to reach you for several days now. We reviewed your lab results and would like for you to come in to our medical mall at your earliest convenience. For these labs you do not need an appointment and can come anytime between the hours of 7:30am-5:30pm.   Marrianne Mood, PA has recommended that you stop taking your lasix since this was making you feel dizzy.   I am glad to see that your blood pressure was good at your doctors appointment on 01/01/20. Continue to monitor your blood pressure readings several times a day and keep a log of them if possible.  We look forward to seeing you at your next appointment on 01/31/20 at 3:40pm   If you have any questions or concerns, please don't hesitate to call.   Orders for labs at the medical mall placed and Lasix discontinued from patients medication list.

## 2020-01-02 NOTE — Telephone Encounter (Signed)
Mailed letter to patient. See telephone note for further documentation.

## 2020-01-03 ENCOUNTER — Ambulatory Visit: Payer: Medicare Other | Admitting: Nurse Practitioner

## 2020-01-09 ENCOUNTER — Telehealth: Payer: Self-pay

## 2020-01-09 NOTE — Telephone Encounter (Signed)
lft pt vm informing that ozempic has arrived at office and ready for pick up

## 2020-01-16 ENCOUNTER — Other Ambulatory Visit: Payer: Medicare Other

## 2020-01-16 ENCOUNTER — Ambulatory Visit: Payer: Self-pay | Admitting: Pharmacist

## 2020-01-16 ENCOUNTER — Telehealth: Payer: Self-pay

## 2020-01-16 NOTE — Chronic Care Management (AMB) (Signed)
  Chronic Care Management   Note  01/16/2020 Name: Edward Schneider. MRN: [id number] DOB: 08-06-1956  Edward Schneider. is a 64 y.o. year old male who is a primary care patient of Crissman, Jeannette How, MD. The CCM team was consulted for assistance with chronic disease management and care coordination needs.    Attempted to contact patient for medication management review. Left HIPAA compliant message for patient to return my call at their convenience.   Plan: - Will collaborate with Care Guide to outreach to schedule follow up with me  Catie Darnelle Maffucci, PharmD, [facility] [phone]

## 2020-01-17 ENCOUNTER — Telehealth: Payer: Self-pay | Admitting: Family Medicine

## 2020-01-17 NOTE — Chronic Care Management (AMB) (Signed)
  Care Management   Note  01/17/2020 Name: Edward Schneider. MRN: [id number] DOB: [birth year]  [name] is a 64 y.o. year old male who is a primary care patient of Jeananne Rama, Jeannette How, MD and is actively engaged with the care management team. I reached out to [name] by phone today to assist with re-scheduling a follow up visit with the Pharmacist  Follow up plan: Unsuccessful telephone outreach attempt made. A HIPPA compliant phone message was left for the patient providing contact information and requesting a return call.  The care management team will reach out to the patient again over the next 7 days.  If patient returns call to provider office, please advise to call [facility]  at [place], [place], [place], [place] [address] Direct Dial: [phone] [email or web address]: [email or web address]

## 2020-01-22 DIAGNOSIS — G894 Chronic pain syndrome: Secondary | ICD-10-CM | POA: Diagnosis not present

## 2020-01-22 DIAGNOSIS — M545 Low back pain: Secondary | ICD-10-CM | POA: Diagnosis not present

## 2020-01-22 DIAGNOSIS — M5136 Other intervertebral disc degeneration, lumbar region: Secondary | ICD-10-CM | POA: Diagnosis not present

## 2020-01-22 DIAGNOSIS — M179 Osteoarthritis of knee, unspecified: Secondary | ICD-10-CM | POA: Diagnosis not present

## 2020-01-22 NOTE — Chronic Care Management (AMB) (Signed)
  Care Management   Note  01/22/2020 Name: Booker Zimmerli. MRN: [id number] DOB: August 14, 1956  [name] is a 64 y.o. year old male who is a primary care patient of Jeananne Rama, Jeannette How, MD and is actively engaged with the care management team. I reached out to [name] by phone today to assist with re-scheduling a follow up visit with the Pharmacist  Follow up plan: Unsuccessful telephone outreach attempt made. A HIPPA compliant phone message was left for the patient providing contact information and requesting a return call.  The care management team will reach out to the patient again over the next 7 days.  If patient returns call to provider office, please advise to call [facility]  at [place], [place], [place], [place] [address] Direct Dial: [phone] [email or web address]: [email or web address]

## 2020-01-25 ENCOUNTER — Encounter: Payer: Self-pay | Admitting: Internal Medicine

## 2020-01-25 ENCOUNTER — Ambulatory Visit (INDEPENDENT_AMBULATORY_CARE_PROVIDER_SITE_OTHER): Payer: Medicare Other | Admitting: Internal Medicine

## 2020-01-25 ENCOUNTER — Other Ambulatory Visit: Payer: Self-pay

## 2020-01-25 VITALS — BP 118/72 | HR 85 | Temp 98.3°F | Ht 68.0 in | Wt 244.8 lb

## 2020-01-25 DIAGNOSIS — E1159 Type 2 diabetes mellitus with other circulatory complications: Secondary | ICD-10-CM

## 2020-01-25 DIAGNOSIS — E1142 Type 2 diabetes mellitus with diabetic polyneuropathy: Secondary | ICD-10-CM

## 2020-01-25 DIAGNOSIS — E1165 Type 2 diabetes mellitus with hyperglycemia: Secondary | ICD-10-CM | POA: Diagnosis not present

## 2020-01-25 LAB — POCT GLYCOSYLATED HEMOGLOBIN (HGB A1C): Hemoglobin A1C: 9 % — AB (ref 4.0–5.6)

## 2020-01-25 LAB — GLUCOSE, POCT (MANUAL RESULT ENTRY): POC Glucose: 307 mg/dl — AB (ref 70–99)

## 2020-01-25 MED ORDER — ACCU-CHEK AVIVA PLUS VI STRP: 1.0000 | ORAL_STRIP | Freq: Four times a day (QID) | 3 refills | Status: DC

## 2020-01-25 MED ORDER — INSULIN LISPRO (1 UNIT DIAL) 100 UNIT/ML (KWIKPEN): PEN_INJECTOR | SUBCUTANEOUS | 3 refills | Status: DC

## 2020-01-25 NOTE — Chronic Care Management (AMB) (Signed)
  Care Management   Note  01/25/2020 Name: Edward Schneider. MRN: [id number] DOB: 08/03/1956  Edward Schneider. is a 64 y.o. year old male who is a primary care patient of Crissman, Jeannette How, MD and is actively engaged with the care management team. I reached out to Edward Schneider. by phone today to assist with re-scheduling a follow up visit with the Pharmacist  Follow up plan: Telephone appointment with care management team member scheduled for:02/23/2020  Noreene Larsson, [place], [place], [place] [address] Direct Dial: [phone] [email or web address]: [email or web address]

## 2020-01-25 NOTE — Patient Instructions (Signed)
-   Tresiba 24 units daily  - Novolog 14 units with Breakfast and Lunch  , 16 units with supper - Continue Ozempic 0.5 mg weekly      HOW TO TREAT LOW BLOOD SUGARS (Blood sugar LESS THAN 70 MG/DL)  Please follow the RULE OF 15 for the treatment of hypoglycemia treatment (when your (blood sugars are less than 70 mg/dL)    STEP 1: Take 15 grams of carbohydrates when your blood sugar is low, which includes:   3-4 GLUCOSE TABS  OR  3-4 OZ OF JUICE OR REGULAR SODA OR  ONE TUBE OF GLUCOSE GEL     STEP 2: RECHECK blood sugar in 15 MINUTES STEP 3: If your blood sugar is still low at the 15 minute recheck --> then, go back to STEP 1 and treat AGAIN with another 15 grams of carbohydrates.
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
## 2020-01-30 ENCOUNTER — Other Ambulatory Visit: Payer: Self-pay

## 2020-01-30 MED ORDER — SUCRALFATE 1 G PO TABS: ORAL_TABLET | ORAL | 1 refills | Status: DC

## 2020-01-31 ENCOUNTER — Encounter: Payer: Self-pay | Admitting: Internal Medicine

## 2020-01-31 ENCOUNTER — Other Ambulatory Visit: Payer: Self-pay

## 2020-01-31 ENCOUNTER — Ambulatory Visit (INDEPENDENT_AMBULATORY_CARE_PROVIDER_SITE_OTHER): Payer: Medicare Other | Admitting: Internal Medicine

## 2020-01-31 VITALS — BP 118/62 | HR 87 | Ht 68.0 in | Wt 244.5 lb

## 2020-01-31 DIAGNOSIS — E785 Hyperlipidemia, unspecified: Secondary | ICD-10-CM | POA: Diagnosis not present

## 2020-01-31 DIAGNOSIS — I10 Essential (primary) hypertension: Secondary | ICD-10-CM

## 2020-01-31 DIAGNOSIS — I25118 Atherosclerotic heart disease of native coronary artery with other forms of angina pectoris: Secondary | ICD-10-CM

## 2020-01-31 DIAGNOSIS — E1165 Type 2 diabetes mellitus with hyperglycemia: Secondary | ICD-10-CM | POA: Diagnosis not present

## 2020-01-31 DIAGNOSIS — R42 Dizziness and giddiness: Secondary | ICD-10-CM | POA: Diagnosis not present
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
## 2020-01-31 NOTE — Patient Instructions (Signed)
Medication Instructions:  Your physician recommends that you continue on your current medications as directed. Please refer to the Current Medication list given to you today.  *If you need a refill on your cardiac medications before your next appointment, please call your pharmacy*   Lab Work: Your physician recommends that you return for lab work in: 2 weeks for LIPID, LIVER panels at the [facility] will need to be fasting. Please do not have anything to eat or drink after midnight the morning you have the lab work. You may only have water or black coffee with no cream or sugar. - Please go to the [facility]. You will check in at the front desk to the right as you walk into the atrium. Valet Parking is offered if needed. - No appointment needed. You may go any day between 7 am and 6 pm.  If you have labs (blood work) drawn today and your tests are completely normal, you will receive your results only by: Marland Kitchen MyChart Message (if you have MyChart) OR . A paper copy in the mail If you have any lab test that is abnormal or we need to change your treatment, we will call you to review the results.   Testing/Procedures: None  Follow-Up: At [facility], you and your health needs are our priority.  As part of our continuing mission to provide you with exceptional heart care, we have created designated Provider Care Teams.  These Care Teams include your primary Cardiologist (physician) and Advanced Practice Providers (APPs -  Physician Assistants and Nurse Practitioners) who all work together to provide you with the care you need, when you need it.  We recommend signing up for the patient portal called "MyChart".  Sign up information is provided on this After Visit Summary.  MyChart is used to connect with patients for Virtual Visits (Telemedicine).  Patients are able to view lab/test results, encounter notes, upcoming appointments, etc.  Non-urgent messages can be sent to your  provider as well.   To learn more about what you can do with MyChart, go to [email or web address].    Your next appointment:   3 month(s)  The format for your next appointment:   In Person  Provider:    You may see Nelva Bush, MD or one of the following Advanced Practice Providers on your designated Care Team:    Murray Hodgkins, NP  Christell Faith, PA-C  Marrianne Mood, PA-C

## 2020-02-01 ENCOUNTER — Encounter: Payer: Self-pay | Admitting: Internal Medicine

## 2020-02-01 DIAGNOSIS — R42 Dizziness and giddiness: Secondary | ICD-10-CM | POA: Insufficient documentation

## 2020-02-13 ENCOUNTER — Other Ambulatory Visit: Payer: Self-pay

## 2020-02-13 MED ORDER — ACCU-CHEK GUIDE VI STRP: ORAL_STRIP | 12 refills | Status: DC

## 2020-02-19 DIAGNOSIS — M545 Low back pain: Secondary | ICD-10-CM | POA: Diagnosis not present

## 2020-02-19 DIAGNOSIS — M179 Osteoarthritis of knee, unspecified: Secondary | ICD-10-CM | POA: Diagnosis not present

## 2020-02-19 DIAGNOSIS — G894 Chronic pain syndrome: Secondary | ICD-10-CM | POA: Diagnosis not present

## 2020-02-19 DIAGNOSIS — M5136 Other intervertebral disc degeneration, lumbar region: Secondary | ICD-10-CM | POA: Diagnosis not present

## 2020-02-20 ENCOUNTER — Other Ambulatory Visit: Payer: Self-pay | Admitting: Nurse Practitioner

## 2020-02-20 ENCOUNTER — Other Ambulatory Visit: Payer: Self-pay | Admitting: Internal Medicine

## 2020-02-20 NOTE — Telephone Encounter (Signed)
Requested Prescriptions  Pending Prescriptions Disp Refills  . pantoprazole (PROTONIX) 40 MG tablet [Pharmacy Med Name: Pantoprazole Sodium 40 MG Oral Tablet Delayed Release] 180 tablet 1    Sig: Take 1 tablet by mouth twice daily     Gastroenterology: Proton Pump Inhibitors Passed - 02/20/2020  6:38 PM      Passed - Valid encounter within last 12 months    Recent Outpatient Visits          1 month ago Hypertension associated with diabetes ([place])   [place], Jolene T, NP   1 month ago Hypotension due to drugs   [employer], Dewy Rose T, NP   2 months ago Type 2 diabetes mellitus with hyperglycemia, with long-term current use of insulin ([place])   [place], [place] T, NP   3 months ago Right arm pain   [place], Megan P, DO   5 months ago Coronary artery disease involving native coronary artery of native heart with unstable angina pectoris ([place])   [place], Barbaraann Faster, NP      Future Appointments            In 1 week [name], NP [employer], [place]   In 2 months Sharolyn Douglas, Clance Boll, NP [place], [place]

## 2020-02-21 DIAGNOSIS — E113291 Type 2 diabetes mellitus with mild nonproliferative diabetic retinopathy without macular edema, right eye: Secondary | ICD-10-CM | POA: Diagnosis not present

## 2020-02-21 LAB — HM DIABETES EYE EXAM

## 2020-02-23 ENCOUNTER — Ambulatory Visit (INDEPENDENT_AMBULATORY_CARE_PROVIDER_SITE_OTHER): Payer: Medicare Other | Admitting: Pharmacist

## 2020-02-23 DIAGNOSIS — J449 Chronic obstructive pulmonary disease, unspecified: Secondary | ICD-10-CM | POA: Diagnosis not present

## 2020-02-23 DIAGNOSIS — M5136 Other intervertebral disc degeneration, lumbar region: Secondary | ICD-10-CM

## 2020-02-23 DIAGNOSIS — E1165 Type 2 diabetes mellitus with hyperglycemia: Secondary | ICD-10-CM | POA: Diagnosis not present

## 2020-02-23 DIAGNOSIS — Z794 Long term (current) use of insulin: Secondary | ICD-10-CM

## 2020-02-23 NOTE — Chronic Care Management (AMB) (Signed)
Chronic Care Management   Follow Up Note   02/23/2020 Name: Edward Schneider. MRN: [id number] DOB: December 02, 1955  Referred by: Edward Maple, MD Reason for referral : Chronic Care Management (Medication Management)   Edward Schneider. is a 64 y.o. year old male who is a primary care patient of Edward Schneider, Edward How, MD. The CCM team was consulted for assistance with chronic disease management and care coordination needs.    Contacted patient for medication management review.   Review of patient status, including review of consultants reports, relevant laboratory and other test results, and collaboration with appropriate care team members and the patient's provider was performed as part of comprehensive patient evaluation and provision of chronic care management services.    SDOH (Social Determinants of Health) assessments performed: Yes See Care Plan activities for detailed interventions related to [facility])     Outpatient Encounter Medications as of 02/23/2020  Medication Sig Note   aspirin EC 81 MG EC tablet Take 1 tablet (81 mg total) by mouth daily.    atorvastatin (LIPITOR) 80 MG tablet Take 1 tablet (80 mg total) by mouth daily.    benazepril (LOTENSIN) 10 MG tablet Take 1 tablet by mouth once daily    CINNAMON PO Take 1,000 mg by mouth 2 (two) times daily.     clopidogrel (PLAVIX) 75 MG tablet Take 1 tablet (75 mg total) by mouth daily with breakfast.    DULoxetine (CYMBALTA) 30 MG capsule Take 30 mg by mouth daily.     DULoxetine (CYMBALTA) 60 MG capsule Take 1 capsule (60 mg total) by mouth daily.    insulin degludec (TRESIBA FLEXTOUCH) 100 UNIT/ML SOPN FlexTouch Pen Inject 0.24 mLs (24 Units total) into the skin daily.    insulin lispro (HUMALOG KWIKPEN) 100 UNIT/ML KwikPen Inject 0.14 mLs (14 Units total) into the skin 2 (two) times daily with breakfast and lunch AND 0.16 mLs (16 Units total) daily with supper. 02/23/2020: 16 units TID    isosorbide mononitrate (IMDUR) 60 MG 24  hr tablet Take 1.5 tablets (90 mg total) by mouth daily.    pregabalin (LYRICA) 50 MG capsule TAKE 1 CAPSULE BY MOUTH THREE TIMES DAILY    Semaglutide,0.25 or 0.5MG/DOS, (OZEMPIC, 0.25 OR 0.5 MG/DOSE,) 2 MG/1.5ML SOPN Inject 0.5 mg into the skin once a week.    Tiotropium Bromide-Olodaterol (STIOLTO RESPIMAT) 2.5-2.5 MCG/ACT AERS Inhale 2 puffs into the lungs daily.    Accu-Chek Softclix Lancets lancets USE 1  TO CHECK GLUCOSE ONCE DAILY    albuterol (VENTOLIN HFA) 108 (90 Base) MCG/ACT inhaler Inhale 2 puffs into the lungs every 6 (six) hours as needed for wheezing or shortness of breath. (Patient not taking: Reported on 02/23/2020)    Ascorbic Acid (VITAMIN C) 1000 MG tablet Take 1,000 mg by mouth daily.    calcium carbonate (OS-CAL) 600 MG TABS tablet Take 600 mg by mouth daily.    Cholecalciferol (VITAMIN D3) 25 MCG (1000 UT) CHEW Chew 1 capsule by mouth daily.     cyclobenzaprine (FLEXERIL) 10 MG tablet Take 1 tablet (10 mg total) by mouth 3 (three) times daily as needed for muscle spasms.    diclofenac sodium (VOLTAREN) 1 % GEL Apply 2 g topically 4 (four) times daily.    diphenhydrAMINE (BENADRYL) 25 mg capsule Take 25 mg by mouth 2 (two) times daily.     doxepin (SINEQUAN) 25 MG capsule Take 25 mg by mouth 2 (two) times daily.     EPINEPHrine 0.3 mg/0.3  mL IJ SOAJ injection Inject 0.3 mLs (0.3 mg total) into the muscle as needed for anaphylaxis.    Flaxseed, Linseed, (FLAX SEED OIL) 1000 MG CAPS Take 1,000 mg by mouth daily.    Ginger, Zingiber officinalis, (GINGER PO) Take 1 Dose by mouth daily.    Ginseng 100 MG CAPS Take by mouth.    glucose blood (ACCU-CHEK GUIDE) test strip Use as instructed to test blood sugar 4 times daily E11.65    Insulin Pen Needle (B-D UF III MINI PEN NEEDLES) 31G X 5 MM MISC Daily    Multiple Vitamins-Minerals (HAIR SKIN AND NAILS FORMULA PO) Take 1 capsule by mouth daily.    mupirocin ointment (BACTROBAN) 2 % APPLY OINTMENT TOPICALLY TWICE  DAILY    NARCAN 4 MG/0.1ML LIQD nasal spray kit CALL 911. ADMINISTER A SINGLE SPRAY OF NARCAN IN ONE NOSTRIL. REPEAT EVERY 3 MINUTES AS NEEDED IF NO OR MINIMAL RESPONSE.    nitroGLYCERIN (NITROSTAT) 0.4 MG SL tablet Place 1 tablet (0.4 mg total) under the tongue every 5 (five) minutes as needed for chest pain.    Edward-3 1000 MG CAPS Take 1,000 mg by mouth daily.     oxyCODONE (ROXICODONE) 15 MG immediate release tablet TAKE 1/2 TO 1 (ONE HALF TO ONE) TABLET BY MOUTH FOUR TO SIX TIMES DAILY IF TOLERATED NOTE TABLET IS 15 MG 09/26/2019: Taking 6 tab per day   pantoprazole (PROTONIX) 40 MG tablet Take 1 tablet by mouth twice daily    sucralfate (CARAFATE) 1 g tablet TAKE 1 TABLET BY MOUTH 4 TIMES DAILY... WITH MEALS AND AT BEDTIME    traZODone (DESYREL) 100 MG tablet Take 1 tablet (100 mg total) by mouth at bedtime as needed for sleep.    triamcinolone cream (KENALOG) 0.1 % Apply 1 application topically 2 (two) times daily.    vitamin A 10000 UNIT capsule Take 10,000 Units by mouth daily.    vitamin B-12 (CYANOCOBALAMIN) 1000 MCG tablet Take 1,000 mcg by mouth daily.    Vitamin E 400 units TABS Take 400 Units by mouth daily.     [DISCONTINUED] benazepril (LOTENSIN) 20 MG tablet Take 10 mg by mouth daily.    [DISCONTINUED] cephALEXin (KEFLEX) 500 MG capsule Take 500 mg by mouth 2 (two) times daily.    [DISCONTINUED] umeclidinium-vilanterol (ANORO ELLIPTA) 62.5-25 MCG/INH AEPB Inhale 1 puff into the lungs daily.    No facility-administered encounter medications on file as of 02/23/2020.     Objective:   Goals Addressed            This Visit's Progress     Patient Stated    PharmD "I want to stay healthy" (pt-stated)       Current Barriers:   Diabetes: uncontrolled but significantly improved, complicated by chronic medical conditions including atherosclerosis (s/p DES to LAD on 11/17), most recent A1c 7.9%; follows w/ Dr. Kelton Schneider @ [facility] o Patient notes his foot  "busted open". His wife has been out of town, so not moisturizing as they usually do. He notes that he is treating the wound as "the food doctor does" with chapstick in the crack and vaseline over top. Notes that his wife will be back in town this evening.  o Reports he has been struggling with chronic pain recently. Sees Dr. Primus Bravo. Reports needing referral to orthopedics.  o Reporting cost concerns with his medications. Difficult to ascertain, but sounds like he changed to a new [facility] plan that has [facility] as preferred.   Current antihyperglycemic  regimen: Tresiba 24 units daily, Novolog 16 units TID; Ozempic 0.5 weekly o Hx GI intolerance to metformin  Current glucose readings:  o Fasting: 200-300s o After meal: <150s  Cardiovascular risk reduction- s/p DES on 09/03/2019; follows w/ Dr. Saunders Revel; o Current hypertensive regimen: benazepril 10 mg daily, isosorbide 90 mg daily o Current hyperlipidemia regimen: atorvastatin 80 mg daily o Current antiplatelet regimen: ASA 81 mg daily, clopidogrel 75 mg daily; plan for DAPT x 12 months, then clopidogrel monotherapy indefinitely.  COPD; Stiolto 2.5/2.5 mg daily. Albuterol HFA PRN. Reports breathing is significantly improved on this medication  Chronic pain- follows w/ Dr. Primus Bravo o Doxepin 25 mg BID, duloxetine 30 mg QAM, 30 mg Qafternoon, 60 mg QPM; pregabalin 50 mg TID, oxycodone IR 15 mg Q4H; cyclobenzaprine 10 mg TID, diclofenac gel PRN  Pharmacist Clinical Goal(s):   Over the next 90 days, patient will work with PharmD and primary care provider to address optimized medication management  Interventions:  Comprehensive medication review performed, medication list updated in electronic medical record  Inter-disciplinary care team collaboration (see longitudinal plan of care)  Counseled to ask wife to check for redness/erythemas or discoloration that could be indicative of a foot wound. Patient has follow up w/ PCP next week.   Reviewed current  complaints of cost of his generic medications (all brands are through patient assistance). Sounds like insurance told him [facility] was preferred. Encouraged him to start transferring medications. He will talk to his cardiologist at upcoming appointment about sending refills to [facility] in [place]. He is not in need of any medications prescribed by PCP right now, so we do not need to send anything to [facility]. He will communicate this need to Dr. Primus Bravo as well  Reviewed previous referral for orthopedics. Appears he would have to be self pay, but he declined d/t not having money for that at the time. Placed Care Guide referral for support navigating an in-network orthopedic provider or funding support. Patient does not remember talking to anyone at [place]. He also reports general issues affording food, sometimes utilities, sometimes his house payment, and can't afford to fix his truck right now, so has some transportation concerns.    Patient would be a candidate for FreeStyle Libre CGM, as he is checking sugars QID and injecting 4 shots of insulin daily. Will collaborate w/ Dr. Everett Graff about this idea.   Patient Self Care Activities:   Patient will check blood glucose BID , document, and provide at future appointments  Patient will take medications as prescribed  Patient will contact provider with any episodes of hypoglycemia  Patient will report any questions or concerns to provider   Please see past updates related to this goal by clicking on the "Past Updates" button in the selected goal          Plan:  - Scheduled f/u call in ~ 6 weeks  Catie Darnelle Maffucci, PharmD, [place] [phone]

## 2020-02-23 NOTE — Patient Instructions (Signed)
Visit Information  Goals Addressed            This Visit's Progress     Patient Stated   . PharmD "I want to stay healthy" (pt-stated)       Current Barriers:  . Diabetes: uncontrolled but significantly improved, complicated by chronic medical conditions including atherosclerosis (s/p DES to LAD on 11/17), most recent A1c 7.9%; follows w/ Dr. Kelton Pillar @ [place] o Patient notes his foot "busted open". His wife has been out of town, so not moisturizing as they usually do. He notes that he is treating the wound as "the food doctor does" with chapstick in the crack and vaseline over top. Notes that his wife will be back in town this evening.  o Reports he has been struggling with chronic pain recently. Sees Dr. Primus Bravo. Reports needing referral to orthopedics.  o Reporting cost concerns with his medications. Difficult to ascertain, but sounds like he changed to a new [facility] plan that has [facility] as preferred.  . Current antihyperglycemic regimen: Tresiba 24 units daily, Novolog 16 units TID; Ozempic 0.5 weekly o Hx GI intolerance to metformin . Current glucose readings:  o Fasting: 200-300s o After meal: <150s . Cardiovascular risk reduction- s/p DES on 09/03/2019; follows w/ Dr. Saunders Revel; o Current hypertensive regimen: benazepril 10 mg daily, isosorbide 90 mg daily o Current hyperlipidemia regimen: atorvastatin 80 mg daily o Current antiplatelet regimen: ASA 81 mg daily, clopidogrel 75 mg daily; plan for DAPT x 12 months, then clopidogrel monotherapy indefinitely. . COPD; Stiolto 2.5/2.5 mg daily. Albuterol HFA PRN. Reports breathing is significantly improved on this medication . Chronic pain- follows w/ Dr. Primus Bravo o Doxepin 25 mg BID, duloxetine 30 mg QAM, 30 mg Qafternoon, 60 mg QPM; pregabalin 50 mg TID, oxycodone IR 15 mg Q4H; cyclobenzaprine 10 mg TID, diclofenac gel PRN  Pharmacist Clinical Goal(s):  Marland Kitchen Over the next 90 days, patient will work with PharmD and primary care provider to  address optimized medication management  Interventions: . Comprehensive medication review performed, medication list updated in electronic medical record . Inter-disciplinary care team collaboration (see longitudinal plan of care) . Counseled to ask wife to check for redness/erythemas or discoloration that could be indicative of a foot wound. Patient has follow up w/ PCP next week.  . Reviewed current complaints of cost of his generic medications (all brands are through patient assistance). Sounds like insurance told him [facility] was preferred. Encouraged him to start transferring medications. He will talk to his cardiologist at upcoming appointment about sending refills to [facility] in [place]. He is not in need of any medications prescribed by PCP right now, so we do not need to send anything to [facility]. He will communicate this need to Dr. Primus Bravo as well . Reviewed previous referral for orthopedics. Appears he would have to be self pay, but he declined d/t not having money for that at the time. Placed Care Guide referral for support navigating an in-network orthopedic provider or funding support. Patient does not remember talking to anyone at [place]. He also reports general issues affording food, sometimes utilities, sometimes his house payment, and can't afford to fix his truck right now, so has some transportation concerns.   . Patient would be a candidate for FreeStyle Libre CGM, as he is checking sugars QID and injecting 4 shots of insulin daily. Will collaborate w/ Dr. Everett Graff about this idea.   Patient Self Care Activities:  . Patient will check blood glucose BID , document, and  provide at future appointments . Patient will take medications as prescribed . Patient will contact provider with any episodes of hypoglycemia . Patient will report any questions or concerns to provider   Please see past updates related to this goal by clicking on the "Past Updates" button in the  selected goal         Patient verbalizes understanding of instructions provided today.    Plan:  - Scheduled f/u call in ~ 6 weeks  Catie Darnelle Maffucci, PharmD, [place] [phone]

## 2020-02-27 NOTE — Progress Notes (Signed)
Good morning, I have spoken to Edward Schneider and he stated that he just picked up a new meter. I asked him to test 4 times a day for the next 2 weeks and then bring his meter by so that I can download that info and fax it into the company.

## 2020-02-28 ENCOUNTER — Ambulatory Visit: Payer: Medicare Other | Admitting: Internal Medicine

## 2020-02-28 DIAGNOSIS — G4733 Obstructive sleep apnea (adult) (pediatric): Secondary | ICD-10-CM | POA: Diagnosis not present

## 2020-03-01 ENCOUNTER — Ambulatory Visit: Payer: Medicare Other | Admitting: Nurse Practitioner

## 2020-03-05 ENCOUNTER — Emergency Department
Admission: EM | Admit: 2020-03-05 | Discharge: 2020-03-05 | Disposition: A | Discharge status: Home / Self Care | Payer: Medicare Other | Attending: Emergency Medicine | Admitting: Emergency Medicine

## 2020-03-05 ENCOUNTER — Emergency Department: Payer: Medicare Other

## 2020-03-05 ENCOUNTER — Telehealth: Payer: Self-pay | Admitting: Internal Medicine

## 2020-03-05 ENCOUNTER — Other Ambulatory Visit: Payer: Self-pay

## 2020-03-05 DIAGNOSIS — R231 Pallor: Secondary | ICD-10-CM | POA: Diagnosis not present

## 2020-03-05 DIAGNOSIS — Z5321 Procedure and treatment not carried out due to patient leaving prior to being seen by health care provider: Secondary | ICD-10-CM | POA: Diagnosis not present

## 2020-03-05 DIAGNOSIS — R079 Chest pain, unspecified: Secondary | ICD-10-CM | POA: Diagnosis not present

## 2020-03-05 DIAGNOSIS — R1032 Left lower quadrant pain: Secondary | ICD-10-CM | POA: Diagnosis not present

## 2020-03-05 DIAGNOSIS — R0789 Other chest pain: Secondary | ICD-10-CM | POA: Diagnosis not present

## 2020-03-05 DIAGNOSIS — Z743 Need for continuous supervision: Secondary | ICD-10-CM | POA: Diagnosis not present

## 2020-03-05 DIAGNOSIS — M5489 Other dorsalgia: Secondary | ICD-10-CM | POA: Diagnosis not present

## 2020-03-05 LAB — BASIC METABOLIC PANEL
Anion gap: 9 (ref 5–15)
BUN: 15 mg/dL (ref 8–23)
CO2: 25 mmol/L (ref 22–32)
Calcium: 9.5 mg/dL (ref 8.9–10.3)
Chloride: 100 mmol/L (ref 98–111)
Creatinine, Ser: 1.19 mg/dL (ref 0.61–1.24)
GFR calc Af Amer: >60 mL/min (ref >60)
GFR calc non Af Amer: >60 mL/min (ref >60)
Glucose, Bld: 274 mg/dL — ABNORMAL HIGH (ref 70–99)
Potassium: 4.2 mmol/L (ref 3.5–5.1)
Sodium: 134 mmol/L — ABNORMAL LOW (ref 135–145)

## 2020-03-05 LAB — CBC
HCT: 34.5 % — ABNORMAL LOW (ref 39.0–52.0)
Hemoglobin: 11.3 g/dL — ABNORMAL LOW (ref 13.0–17.0)
MCH: 27.8 pg (ref 26.0–34.0)
MCHC: 32.8 g/dL (ref 30.0–36.0)
MCV: 85 fL (ref 80.0–100.0)
Platelets: 195 10*3/uL (ref 150–400)
RBC: 4.06 MIL/uL — ABNORMAL LOW (ref 4.22–5.81)
RDW: 13.9 % (ref 11.5–15.5)
WBC: 8.8 10*3/uL (ref 4.0–10.5)
nRBC: 0 % (ref 0.0–0.2)

## 2020-03-05 LAB — TROPONIN I (HIGH SENSITIVITY): Troponin I (High Sensitivity): 4 ng/L (ref <18)

## 2020-03-05 MED ORDER — SODIUM CHLORIDE 0.9% FLUSH: 3.0000 mL | Freq: Once | INTRAVENOUS | Status: DC

## 2020-03-05 MED ORDER — ATORVASTATIN CALCIUM 80 MG PO TABS
40.0000 mg | ORAL_TABLET | Freq: Every day | ORAL | 3 refills | Status: DC
Start: 2020-03-05 — End: 2020-05-07

## 2020-03-05 NOTE — Telephone Encounter (Signed)
Patient verbalized understanding to restart atorvastatin 40 mg daily.  States his leg pain did completely resolved.  Med list updated.

## 2020-03-05 NOTE — ED Triage Notes (Signed)
Pt to the er via ems for chest pain. Pt took 3 nitro with no relief. Pt now states there is no pain now. Pt states he had sob and diaphoresis when it happened but not now.

## 2020-03-05 NOTE — Telephone Encounter (Signed)
Pt c/o medication issue:  1. Name of Medication: Lipitor  2. How are you currently taking this medication (dosage and times per day)?  80 mg daily  3. Are you having a reaction (difficulty breathing--STAT)? Patient states his PCP took him off this medication and is causing his legs to swell.   4. What is your medication issue? Unable to walk

## 2020-03-05 NOTE — Telephone Encounter (Signed)
If his leg pain has completely resolved, I recommend restarting atorvastatin 40 mg daily.  Nelva Bush, MD [facility]

## 2020-03-05 NOTE — ED Notes (Signed)
Pt seen walking out of lobby. IV noted in trash can in bathroom near subwait. Catheter intact.

## 2020-03-05 NOTE — Telephone Encounter (Signed)
S/w patient.  He increased atorvastatin to 80 mg daily about 1 month ago. Since the increase his legs have been swelling and he's had such bad pain that he could not walk. PCP had him stop it for 1 week. After 2 days the swelling went down and he could walk again.  Reports he did not have these symptoms on the 40 mg daily. Advised I will make Dr End aware and let him know further recommendations.

## 2020-03-05 NOTE — ED Triage Notes (Signed)
Pt comes into the ED via EMS from home with c/o left flank radiating around to abd and chest. VSS, hx of lumber surgery and thoracic.

## 2020-03-05 NOTE — ED Notes (Signed)
Pt calling out from subwait. States he does not want to wait to be seen. Pt reports he has been to xray and is ready to go. Pt reassured and encouraged to be seen by MD. verbalized understanding the risks of leaving and states he will wait a bit longer.

## 2020-03-06 ENCOUNTER — Ambulatory Visit (INDEPENDENT_AMBULATORY_CARE_PROVIDER_SITE_OTHER): Payer: Medicare Other | Admitting: Nurse Practitioner

## 2020-03-06 ENCOUNTER — Encounter: Payer: Self-pay | Admitting: Nurse Practitioner

## 2020-03-06 ENCOUNTER — Ambulatory Visit (INDEPENDENT_AMBULATORY_CARE_PROVIDER_SITE_OTHER): Payer: Medicare Other

## 2020-03-06 VITALS — BP 118/68 | HR 76 | Temp 98.2°F | Ht 67.84 in | Wt 247.5 lb

## 2020-03-06 VITALS — BP 144/84 | HR 76 | Temp 98.2°F | Ht 67.0 in | Wt 247.0 lb

## 2020-03-06 DIAGNOSIS — I700 Atherosclerosis of aorta: Secondary | ICD-10-CM

## 2020-03-06 DIAGNOSIS — F321 Major depressive disorder, single episode, moderate: Secondary | ICD-10-CM

## 2020-03-06 DIAGNOSIS — Z0000 Encounter for general adult medical examination without abnormal findings: Secondary | ICD-10-CM | POA: Diagnosis not present

## 2020-03-06 DIAGNOSIS — E1169 Type 2 diabetes mellitus with other specified complication: Secondary | ICD-10-CM | POA: Diagnosis not present

## 2020-03-06 DIAGNOSIS — F5104 Psychophysiologic insomnia: Secondary | ICD-10-CM

## 2020-03-06 DIAGNOSIS — M5442 Lumbago with sciatica, left side: Secondary | ICD-10-CM

## 2020-03-06 DIAGNOSIS — E1159 Type 2 diabetes mellitus with other circulatory complications: Secondary | ICD-10-CM

## 2020-03-06 DIAGNOSIS — E1165 Type 2 diabetes mellitus with hyperglycemia: Secondary | ICD-10-CM

## 2020-03-06 DIAGNOSIS — M5441 Lumbago with sciatica, right side: Secondary | ICD-10-CM

## 2020-03-06 DIAGNOSIS — Z1211 Encounter for screening for malignant neoplasm of colon: Secondary | ICD-10-CM

## 2020-03-06 DIAGNOSIS — J449 Chronic obstructive pulmonary disease, unspecified: Secondary | ICD-10-CM | POA: Diagnosis not present

## 2020-03-06 DIAGNOSIS — E785 Hyperlipidemia, unspecified: Secondary | ICD-10-CM

## 2020-03-06 DIAGNOSIS — G8929 Other chronic pain: Secondary | ICD-10-CM

## 2020-03-06 DIAGNOSIS — I152 Hypertension secondary to endocrine disorders: Secondary | ICD-10-CM

## 2020-03-06 DIAGNOSIS — Z6837 Body mass index (BMI) 37.0-37.9, adult: Secondary | ICD-10-CM

## 2020-03-06 DIAGNOSIS — I10 Essential (primary) hypertension: Secondary | ICD-10-CM

## 2020-03-06 NOTE — Assessment & Plan Note (Signed)
Chronic, ongoing, followed by pain management (Dr. Primus Bravo).  Continue current medication regimen as prescribed by this provider and Duloxetine for mood and pain. Referral to [facility] placed as requested.

## 2020-03-06 NOTE — Assessment & Plan Note (Signed)
Chronic, ongoing.  Continue current medication regimen and adjust as needed. Lipid panel today. 

## 2020-03-06 NOTE — Assessment & Plan Note (Signed)
Chronic, ongoing secondary to chronic diseases and pain.  Continue Duloxetine, which benefits mood and pain, plus Trazodone for sleep.  He denies SI/HI today, but endorses occasional fleeting thoughts with safety plan in place and no guns in home.  Refuses psychiatry or therapy referral.  Will continue current regimen and adjust as needed.  If SI presents he is to immediately go to ER, which he agrees with.  Return to office in 3 months, sooner if worsening mood.
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
## 2020-03-06 NOTE — Assessment & Plan Note (Signed)
Noted on CT scan in 2016, continue daily statin and ASA.

## 2020-03-06 NOTE — Progress Notes (Signed)
BP 118/68   Pulse 76   Temp 98.2 F (36.8 C)   Ht 5' 7.84" (1.723 m)   Wt 247 lb 8 oz (112.3 kg)   SpO2 95%   BMI 37.82 kg/m    Subjective:    Patient ID: Edward Flesher., male    DOB: December 19, 1955, 64 y.o.   MRN: [id number]  HPI: Edward Kraemer. is a 64 y.o. male  Chief Complaint  Patient presents with  . Back Pain    Needs a referral to [place] surgery  . Referral    Needs a colonoscopy annually, he goes to Dr.Wohl   DIABETES Sees endocrinology and last saw4/8/21 with A1C 10.9%, Dr. Wilmon Pali.Changedinsulin Tresiba to 24units and Humalog 14 units with breakfast and lunch and then 16 with supper.  Taking Ozempic 0.5 MG weekly.  Is checking sugar 5 times a day and reports endocrinology has talked to him about the [place]. Hypoglycemic episodes:no Polydipsia/polyuria:no Visual disturbance:no Chest pain:no Paresthesias:no Glucose Monitoring:yes Accucheck frequency: QID Fasting glucose:160 - 335 Post prandial:130's on average Evening:156 last night Before meals:140-160 Taking Insulin?:yes Long acting insulin: Tresiba24units Short acting insulin: Humalog 14 units with breakfast and lunch and then 16 with supper Blood Pressure Monitoring:daily Retinal Examination:Up to Date Foot Exam:Up to Date Pneumovax:Up to Date Influenza:Up to Date Aspirin:yes  HYPERTENSION / HYPERLIPIDEMIA Followed by cardiology and last saw 01/31/20.  Last echo March 2020 = EF 50-55% with normal left ventricular diastolic parameters.  He reports cardiology increased his statin to 80 MG and he can not tolerate that, having leg cramps, has been advised by cardiology to decrease to 40 MG. Satisfied with current treatment?yes Duration of hypertension:chronic BP monitoring frequency:rarely BP range:120/80 on average -- to 134/79 yesterday BP medication side  effects:no Duration of hyperlipidemia:chronic Cholesterol medication side effects:no Cholesterol supplements: none Medication compliance:good compliance Aspirin:yes Recent stressors:no Recurrent headaches:no Visual changes:no Palpitations:no Dyspnea:no Chest pain:x 1 episode yesterday took NTG x 3 and resolved Lower extremity edema:no Dizzy/lightheaded:no The 10-year ASCVD risk score [name], et al., 2013) is: 18.3%   Values used to calculate the score:     Age: 51 years     Sex: Male     Is Non-Hispanic African American: No     Diabetic: Yes     Tobacco smoker: No     Systolic Blood Pressure: 123456 mmHg     Is BP treated: Yes     HDL Cholesterol: 44 mg/dL     Total Cholesterol: 174 mg/dL  COPD Continues Anoro and Albuterol. Does use CPAP and uses 100% of the time. Smoked for 48 years, was 6-7 when started.  Smoked 3 cartons to a carton and a half a week.  Quit in 2012.  Goes for annual lung screening, last went July 2020.  Has never been to pulmonary. COPD status:stable Satisfied with current treatment?:yes Oxygen use:no Dyspnea frequency:at baseline Cough frequency:minimal Rescue inhaler frequency:using three times a day Limitation of activity:no Productive cough: none Last Spirometry: unknown  Pneumovax:Up to Date Influenza:Up to Date   DEPRESSION Continues on Duloxetine 90 MG daily and Trazodone.  Reports occasional fleeting thoughts of self harm, but no plan and states "I would never do it because I love my wife".  No guns in home.  Has safety plan in place, would talk to wife and go to ER if SI/HI presents.  Refuses psychiatry referral. Mood status: stable Satisfied with current treatment?: yes Symptom severity: moderate  Duration of current treatment :  chronic Side effects: no Medication compliance: good compliance Psychotherapy/counseling: none Depressed mood: yes Anxious mood: no Anhedonia: no Significant weight loss or gain:  no Insomnia: yes hard to fall asleep Fatigue: no Feelings of worthlessness or guilt: yes Impaired concentration/indecisiveness: yes Suicidal ideations: as above Hopelessness: no Crying spells: no Depression screen [facility] 2/9 03/06/2020 03/06/2020 04/27/2019 04/25/2018 11/03/2017  Decreased Interest 0 0 0 3 0  Down, Depressed, Hopeless 3 0 3 3 0  PHQ - 2 Score 3 0 3 6 0  Altered sleeping 3 - 1 1 -  Tired, decreased energy 0 - 1 3 -  Change in appetite 1 - 3 1 -  Feeling bad or failure about yourself  1 - 0 3 -  Trouble concentrating 0 - 0 1 -  Moving slowly or fidgety/restless 0 - 0 1 -  Suicidal thoughts 1 - 0 0 -  PHQ-9 Score 9 - 8 16 -  Difficult doing work/chores Somewhat difficult - Somewhat difficult Somewhat difficult -  Some recent data might be hidden    CHRONIC PAIN Pain medicine provider, Dr. Primus Bravo, has recommended he go see [facility].  Takes Oxycodone as needed. Duration: chronic Mechanism of injury: unknown Location: low back Onset: gradual Severity: 10/10 Quality: sharp and aching Frequency: intermittent Radiation: R leg below the knee and L leg below the knee Aggravating factors: lifting, movement, walking and bending Alleviating factors: nothing Status: worse Treatments attempted: Oxycodone Relief with NSAIDs?: No NSAIDs Taken Nighttime pain:  at times Paresthesias / decreased sensation:  yes Bowel / bladder incontinence:  no Fevers:  no Dysuria / urinary frequency:  no  Relevant past medical, surgical, family and social history reviewed and updated as indicated. Interim medical history since our last visit reviewed. Allergies and medications reviewed and updated.  Review of Systems  Constitutional: Negative for activity change, diaphoresis, fatigue and fever.  Respiratory: Negative for cough, chest tightness, shortness of breath and wheezing.   Cardiovascular: Negative for chest pain, palpitations and leg swelling.  Gastrointestinal:  Negative.   Endocrine: Negative for cold intolerance, heat intolerance, polydipsia, polyphagia and polyuria.  Musculoskeletal: Positive for back pain.  Neurological: Negative.   Psychiatric/Behavioral: Positive for decreased concentration and sleep disturbance. Negative for self-injury. The patient is nervous/anxious.     Per HPI unless specifically indicated above     Objective:    BP 118/68   Pulse 76   Temp 98.2 F (36.8 C)   Ht 5' 7.84" (1.723 m)   Wt 247 lb 8 oz (112.3 kg)   SpO2 95%   BMI 37.82 kg/m   Wt Readings from Last 3 Encounters:  03/06/20 247 lb (112 kg)  03/06/20 247 lb 8 oz (112.3 kg)  03/05/20 243 lb (110.2 kg)    Physical Exam Vitals and nursing note reviewed.  Constitutional:      General: He is awake. He is not in acute distress.    Appearance: He is well-developed. He is morbidly obese. He is not ill-appearing.  HENT:     Head: Normocephalic and atraumatic.     Right Ear: Hearing normal. No drainage.     Left Ear: Hearing normal. No drainage.  Eyes:     General: Lids are normal.        Right eye: No discharge.        Left eye: No discharge.     Conjunctiva/sclera: Conjunctivae normal.     Pupils: Pupils are equal, round, and reactive to light.  Neck:  Vascular: No carotid bruit.  Cardiovascular:     Rate and Rhythm: Normal rate and regular rhythm.     Heart sounds: Normal heart sounds, S1 normal and S2 normal. No murmur. No gallop.   Pulmonary:     Effort: Pulmonary effort is normal. No accessory muscle usage or respiratory distress.     Breath sounds: Normal breath sounds.     Comments: No wheezes, slightly diminished throughout. Abdominal:     General: Bowel sounds are normal.     Palpations: Abdomen is soft.  Musculoskeletal:        General: Normal range of motion.     Cervical back: Normal range of motion and neck supple.     Right lower leg: No edema.     Left lower leg: No edema.  Skin:    General: Skin is warm and dry.   Neurological:     Mental Status: He is alert and oriented to person, place, and time.     Deep Tendon Reflexes: Reflexes are normal and symmetric.  Psychiatric:        Attention and Perception: Attention normal.        Mood and Affect: Mood normal.        Speech: Speech normal.        Behavior: Behavior normal. Behavior is cooperative.        Thought Content: Thought content normal.     Results for orders placed or performed during the hospital encounter of [id number]  Basic metabolic panel  Result Value Ref Range   Sodium 134 (L) 135 - 145 mmol/L   Potassium 4.2 3.5 - 5.1 mmol/L   Chloride 100 98 - 111 mmol/L   CO2 25 22 - 32 mmol/L   Glucose, Bld 274 (H) 70 - 99 mg/dL   BUN 15 8 - 23 mg/dL   Creatinine, Ser 1.19 0.61 - 1.24 mg/dL   Calcium 9.5 8.9 - 10.3 mg/dL   GFR calc non Af Amer >60 >60 mL/min   GFR calc Af Amer >60 >60 mL/min   Anion gap 9 5 - 15  CBC  Result Value Ref Range   WBC 8.8 4.0 - 10.5 K/uL   RBC 4.06 (L) 4.22 - 5.81 MIL/uL   Hemoglobin 11.3 (L) 13.0 - 17.0 g/dL   HCT 34.5 (L) 39.0 - 52.0 %   MCV 85.0 80.0 - 100.0 fL   MCH 27.8 26.0 - 34.0 pg   MCHC 32.8 30.0 - 36.0 g/dL   RDW 13.9 11.5 - 15.5 %   Platelets 195 150 - 400 K/uL   nRBC 0.0 0.0 - 0.2 %  Troponin I (High Sensitivity)  Result Value Ref Range   Troponin I (High Sensitivity) 4 <18 ng/L      Assessment & Plan:   Problem List Items Addressed This Visit      Cardiovascular and Mediastinum   Hypertension associated with diabetes ([place])    Chronic, stable with BP at goal.  Continue current medication regimen and adjust as needed + continue collaboration with cardiology team.  Recommend he continue to monitor BP closely at home.  DASH diet at home.  Return as scheduled in 3 months.      Atherosclerosis of abdominal aorta ([place])    Noted on CT scan in 2016, continue daily statin and ASA.        Respiratory   Chronic obstructive pulmonary disease (HCC)    Chronic, ongoing.  FEV1 52% and  FEV1/FVC 96% recently.  Continue  current inhaler regimen, Stiolto through assistance program received and Albuterol.  If increased SOB, will consider referral to pulmonary for further testing and recommendations.  Recommend focus on modest weight loss.        Endocrine   Hyperlipidemia associated with type 2 diabetes mellitus (HCC)    Chronic, ongoing.  Continue current medication regimen and adjust as needed.  Lipid panel today.       Relevant Orders   Lipid Panel w/o Chol/HDL Ratio   Uncontrolled type 2 diabetes mellitus with hyperglycemia (HCC) - Primary    Chronic, ongoing.  Recent A1C with endo 10.9%.  Will obtain urine micro next visit in office.  Continue collaboration with endocrinology and current medication regimen.  Continue collaboration with CCM team.  Recommend continue to monitor BS QID, may benefit from [facility], and focus heavily on diet and modest weight loss.  Return in 3 months.        Other   Chronic back pain    Chronic, ongoing, followed by pain management (Dr. Primus Bravo).  Continue current medication regimen as prescribed by this provider and Duloxetine for mood and pain. Referral to [facility] placed as requested.      Relevant Orders   Ambulatory referral to Orthopedics   Insomnia    Chronic, ongoing.  Continue Trazodone and adjust dose as needed.        Morbid obesity ([place])    With T2DM and HTN.  Recommended eating smaller high protein, low fat meals more frequently and exercising 30 mins a day 5 times a week with a goal of 10-15lb weight loss in the next 3 months. Patient voiced their understanding and motivation to adhere to these recommendations.       Depression, major, single episode, moderate (HCC)    Chronic, ongoing secondary to chronic diseases and pain.  Continue Duloxetine, which benefits mood and pain, plus Trazodone for sleep.  He denies SI/HI today, but endorses occasional fleeting thoughts with safety plan in place  and no guns in home.  Refuses psychiatry or therapy referral.  Will continue current regimen and adjust as needed.  If SI presents he is to immediately go to ER, which he agrees with.  Return to office in 3 months, sooner if worsening mood.      BMI 37.0-37.9, adult    Refer to morbid obesity plan of care.       Other Visit Diagnoses    Colon cancer screening       Referral to GI placed   Relevant Orders   Ambulatory referral to Gastroenterology       Follow up plan: Return in about 3 months (around 06/06/2020) for T2DM, HTN/HLD, COPD, GERD, OSA, BPH.

## 2020-03-06 NOTE — Assessment & Plan Note (Signed)
Refer to morbid obesity plan of care. 

## 2020-03-06 NOTE — Assessment & Plan Note (Addendum)
With T2DM and HTN.  Recommended eating smaller high protein, low fat meals more frequently and exercising 30 mins a day 5 times a week with a goal of 10-15lb weight loss in the next 3 months. Patient voiced their understanding and motivation to adhere to these recommendations. ° °

## 2020-03-06 NOTE — Assessment & Plan Note (Signed)
Chronic, ongoing.  Continue Trazodone and adjust dose as needed.

## 2020-03-06 NOTE — Assessment & Plan Note (Addendum)
Chronic, stable with BP at goal.  Continue current medication regimen and adjust as needed + continue collaboration with cardiology team.  Recommend he continue to monitor BP closely at home.  DASH diet at home.  Return as scheduled in 3 months.

## 2020-03-06 NOTE — Assessment & Plan Note (Signed)
Chronic, ongoing.  FEV1 52% and FEV1/FVC 96% recently.  Continue current inhaler regimen, Stiolto through assistance program received and Albuterol.  If increased SOB, will consider referral to pulmonary for further testing and recommendations.  Recommend focus on modest weight loss.

## 2020-03-06 NOTE — Patient Instructions (Signed)
Carbohydrate Counting for Diabetes Mellitus, Adult  Carbohydrate counting is a method of keeping track of how many carbohydrates you eat. Eating carbohydrates naturally increases the amount of sugar (glucose) in the blood. Counting how many carbohydrates you eat helps keep your blood glucose within normal limits, which helps you manage your diabetes (diabetes mellitus). It is important to know how many carbohydrates you can safely have in each meal. This is different for every person. A diet and nutrition specialist (registered dietitian) can help you make a meal plan and calculate how many carbohydrates you should have at each meal and snack. Carbohydrates are found in the following foods:  Grains, such as breads and cereals.  Dried beans and soy products.  Starchy vegetables, such as potatoes, peas, and corn.  Fruit and fruit juices.  Milk and yogurt.  Sweets and snack foods, such as cake, cookies, candy, chips, and soft drinks. How do I count carbohydrates? There are two ways to count carbohydrates in food. You can use either of the methods or a combination of both. Reading "Nutrition Facts" on packaged food The "Nutrition Facts" list is included on the labels of almost all packaged foods and beverages in the [place] It includes:  The serving size.  Information about nutrients in each serving, including the grams (g) of carbohydrate per serving. To use the "Nutrition Facts":  Decide how many servings you will have.  Multiply the number of servings by the number of carbohydrates per serving.  The resulting number is the total amount of carbohydrates that you will be having. Learning standard serving sizes of other foods When you eat carbohydrate foods that are not packaged or do not include "Nutrition Facts" on the label, you need to measure the servings in order to count the amount of carbohydrates:  Measure the foods that you will eat with a food scale or measuring cup, if  needed.  Decide how many standard-size servings you will eat.  Multiply the number of servings by 15. Most carbohydrate-rich foods have about 15 g of carbohydrates per serving. ? For example, if you eat 8 oz (170 g) of strawberries, you will have eaten 2 servings and 30 g of carbohydrates (2 servings x 15 g = 30 g).  For foods that have more than one food mixed, such as soups and casseroles, you must count the carbohydrates in each food that is included. The following list contains standard serving sizes of common carbohydrate-rich foods. Each of these servings has about 15 g of carbohydrates:   hamburger bun or  English muffin.   oz (15 mL) syrup.   oz (14 g) jelly.  1 slice of bread.  1 six-inch tortilla.  3 oz (85 g) cooked rice or pasta.  4 oz (113 g) cooked dried beans.  4 oz (113 g) starchy vegetable, such as peas, corn, or potatoes.  4 oz (113 g) hot cereal.  4 oz (113 g) mashed potatoes or  of a large baked potato.  4 oz (113 g) canned or frozen fruit.  4 oz (120 mL) fruit juice.  4-6 crackers.  6 chicken nuggets.  6 oz (170 g) unsweetened dry cereal.  6 oz (170 g) plain fat-free yogurt or yogurt sweetened with artificial sweeteners.  8 oz (240 mL) milk.  8 oz (170 g) fresh fruit or one small piece of fruit.  24 oz (680 g) popped popcorn. Example of carbohydrate counting Sample meal  3 oz (85 g) chicken breast.  6 oz (170 g)   brown rice.  4 oz (113 g) corn.  8 oz (240 mL) milk.  8 oz (170 g) strawberries with sugar-free whipped topping. Carbohydrate calculation 1. Identify the foods that contain carbohydrates: ? Rice. ? Corn. ? Milk. ? Strawberries. 2. Calculate how many servings you have of each food: ? 2 servings rice. ? 1 serving corn. ? 1 serving milk. ? 1 serving strawberries. 3. Multiply each number of servings by 15 g: ? 2 servings rice x 15 g = 30 g. ? 1 serving corn x 15 g = 15 g. ? 1 serving milk x 15 g = 15 g. ? 1  serving strawberries x 15 g = 15 g. 4. Add together all of the amounts to find the total grams of carbohydrates eaten: ? 30 g + 15 g + 15 g + 15 g = 75 g of carbohydrates total. Summary  Carbohydrate counting is a method of keeping track of how many carbohydrates you eat.  Eating carbohydrates naturally increases the amount of sugar (glucose) in the blood.  Counting how many carbohydrates you eat helps keep your blood glucose within normal limits, which helps you manage your diabetes.  A diet and nutrition specialist (registered dietitian) can help you make a meal plan and calculate how many carbohydrates you should have at each meal and snack. This information is not intended to replace advice given to you by your health care provider. Make sure you discuss any questions you have with your health care provider. Document Revised: 04/29/2017 Document Reviewed: 03/18/2016 Elsevier Patient Education  2020 [employer]  

## 2020-03-06 NOTE — Assessment & Plan Note (Signed)
Chronic, ongoing.  Recent A1C with endo 10.9%.  Will obtain urine micro next visit in office.  Continue collaboration with endocrinology and current medication regimen.  Continue collaboration with CCM team.  Recommend continue to monitor BS QID, may benefit from [facility], and focus heavily on diet and modest weight loss.  Return in 3 months.

## 2020-03-06 NOTE — Patient Instructions (Signed)
Edward Schneider , Thank you for taking time to come for your Medicare Wellness Visit. I appreciate your ongoing commitment to your health goals. Please review the following plan we discussed and let me know if I can assist you in the future.   Screening recommendations/referrals: Colonoscopy: referral  Recommended yearly ophthalmology/optometry visit for glaucoma screening and checkup Recommended yearly dental visit for hygiene and checkup  Vaccinations: Influenza vaccine: due 06/2020 Pneumococcal vaccine: due at age 75 Tdap vaccine: up to date  Shingles vaccine: shingrix completed    Covid-19: declined   Advanced directives: Advance directive discussed with you today. I have provided a copy for you to complete at home and have notarized. Once this is complete please bring a copy in to our office so we can scan it into your chart.  Conditions/risks identified: discusses chronic care management team.   Next appointment: Follow up in one year for your annual wellness visit.   Preventive Care 40-64 Years, Male Preventive care refers to lifestyle choices and visits with your health care provider that can promote health and wellness. What does preventive care include?  A yearly physical exam. This is also called an annual well check.  Dental exams once or twice a year.  Routine eye exams. Ask your health care provider how often you should have your eyes checked.  Personal lifestyle choices, including:  Daily care of your teeth and gums.  Regular physical activity.  Eating a healthy diet.  Avoiding tobacco and drug use.  Limiting alcohol use.  Practicing safe sex.  Taking low-dose aspirin every day starting at age 76. What happens during an annual well check? The services and screenings done by your health care provider during your annual well check will depend on your age, overall health, lifestyle risk factors, and family history of disease. Counseling  Your health care provider  may ask you questions about your:  Alcohol use.  Tobacco use.  Drug use.  Emotional well-being.  Home and relationship well-being.  Sexual activity.  Eating habits.  Work and work Statistician. Screening  You may have the following tests or measurements:  Height, weight, and BMI.  Blood pressure.  Lipid and cholesterol levels. These may be checked every 5 years, or more frequently if you are over 38 years old.  Skin check.  Lung cancer screening. You may have this screening every year starting at age 12 if you have a 30-pack-year history of smoking and currently smoke or have quit within the past 15 years.  Fecal occult blood test (FOBT) of the stool. You may have this test every year starting at age 28.  Flexible sigmoidoscopy or colonoscopy. You may have a sigmoidoscopy every 5 years or a colonoscopy every 10 years starting at age 47.  Prostate cancer screening. Recommendations will vary depending on your family history and other risks.  Hepatitis C blood test.  Hepatitis B blood test.  Sexually transmitted disease (STD) testing.  Diabetes screening. This is done by checking your blood sugar (glucose) after you have not eaten for a while (fasting). You may have this done every 1-3 years. Discuss your test results, treatment options, and if necessary, the need for more tests with your health care provider. Vaccines  Your health care provider may recommend certain vaccines, such as:  Influenza vaccine. This is recommended every year.  Tetanus, diphtheria, and acellular pertussis (Tdap, Td) vaccine. You may need a Td booster every 10 years.  Zoster vaccine. You may need this after age 83.  Pneumococcal 13-valent conjugate (PCV13) vaccine. You may need this if you have certain conditions and have not been vaccinated.  Pneumococcal polysaccharide (PPSV23) vaccine. You may need one or two doses if you smoke cigarettes or if you have certain conditions. Talk to your  health care provider about which screenings and vaccines you need and how often you need them. This information is not intended to replace advice given to you by your health care provider. Make sure you discuss any questions you have with your health care provider. Document Released: 11/01/2015 Document Revised: 06/24/2016 Document Reviewed: 08/06/2015 Elsevier Interactive Patient Education  2017 [place] Prevention in the Home Falls can cause injuries. They can happen to people of all ages. There are many things you can do to make your home safe and to help prevent falls. What can I do on the outside of my home?  Regularly fix the edges of walkways and driveways and fix any cracks.  Remove anything that might make you trip as you walk through a door, such as a raised step or threshold.  Trim any bushes or trees on the path to your home.  Use bright outdoor lighting.  Clear any walking paths of anything that might make someone trip, such as rocks or tools.  Regularly check to see if handrails are loose or broken. Make sure that both sides of any steps have handrails.  Any raised decks and porches should have guardrails on the edges.  Have any leaves, snow, or ice cleared regularly.  Use sand or salt on walking paths during winter.  Clean up any spills in your garage right away. This includes oil or grease spills. What can I do in the bathroom?  Use night lights.  Install grab bars by the toilet and in the tub and shower. Do not use towel bars as grab bars.  Use non-skid mats or decals in the tub or shower.  If you need to sit down in the shower, use a plastic, non-slip stool.  Keep the floor dry. Clean up any water that spills on the floor as soon as it happens.  Remove soap buildup in the tub or shower regularly.  Attach bath mats securely with double-sided non-slip rug tape.  Do not have throw rugs and other things on the floor that can make you trip. What  can I do in the bedroom?  Use night lights.  Make sure that you have a light by your bed that is easy to reach.  Do not use any sheets or blankets that are too big for your bed. They should not hang down onto the floor.  Have a firm chair that has side arms. You can use this for support while you get dressed.  Do not have throw rugs and other things on the floor that can make you trip. What can I do in the kitchen?  Clean up any spills right away.  Avoid walking on wet floors.  Keep items that you use a lot in easy-to-reach places.  If you need to reach something above you, use a strong step stool that has a grab bar.  Keep electrical cords out of the way.  Do not use floor polish or wax that makes floors slippery. If you must use wax, use non-skid floor wax.  Do not have throw rugs and other things on the floor that can make you trip. What can I do with my stairs?  Do not leave any items on the  stairs.  Make sure that there are handrails on both sides of the stairs and use them. Fix handrails that are broken or loose. Make sure that handrails are as long as the stairways.  Check any carpeting to make sure that it is firmly attached to the stairs. Fix any carpet that is loose or worn.  Avoid having throw rugs at the top or bottom of the stairs. If you do have throw rugs, attach them to the floor with carpet tape.  Make sure that you have a light switch at the top of the stairs and the bottom of the stairs. If you do not have them, ask someone to add them for you. What else can I do to help prevent falls?  Wear shoes that:  Do not have high heels.  Have rubber bottoms.  Are comfortable and fit you well.  Are closed at the toe. Do not wear sandals.  If you use a stepladder:  Make sure that it is fully opened. Do not climb a closed stepladder.  Make sure that both sides of the stepladder are locked into place.  Ask someone to hold it for you, if possible.   Clearly mark and make sure that you can see:  Any grab bars or handrails.  First and last steps.  Where the edge of each step is.  Use tools that help you move around (mobility aids) if they are needed. These include:  Canes.  Walkers.  Scooters.  Crutches.  Turn on the lights when you go into a dark area. Replace any light bulbs as soon as they burn out.  Set up your furniture so you have a clear path. Avoid moving your furniture around.  If any of your floors are uneven, fix them.  If there are any pets around you, be aware of where they are.  Review your medicines with your doctor. Some medicines can make you feel dizzy. This can increase your chance of falling. Ask your doctor what other things that you can do to help prevent falls. This information is not intended to replace advice given to you by your health care provider. Make sure you discuss any questions you have with your health care provider. Document Released: 08/01/2009 Document Revised: 03/12/2016 Document Reviewed: 11/09/2014 Elsevier Interactive Patient Education  2017 [employer].

## 2020-03-07 ENCOUNTER — Ambulatory Visit: Payer: Medicare Other | Admitting: Podiatry

## 2020-03-07 ENCOUNTER — Other Ambulatory Visit: Payer: Self-pay

## 2020-03-07 ENCOUNTER — Encounter: Payer: Self-pay | Admitting: Podiatry

## 2020-03-07 ENCOUNTER — Telehealth: Payer: Self-pay | Admitting: Family Medicine

## 2020-03-07 VITALS — Temp 98.2°F

## 2020-03-07 DIAGNOSIS — D689 Coagulation defect, unspecified: Secondary | ICD-10-CM

## 2020-03-07 DIAGNOSIS — B351 Tinea unguium: Secondary | ICD-10-CM

## 2020-03-07 DIAGNOSIS — E1142 Type 2 diabetes mellitus with diabetic polyneuropathy: Secondary | ICD-10-CM | POA: Diagnosis not present

## 2020-03-07 DIAGNOSIS — M79674 Pain in right toe(s): Secondary | ICD-10-CM | POA: Diagnosis not present

## 2020-03-07 DIAGNOSIS — M79675 Pain in left toe(s): Secondary | ICD-10-CM

## 2020-03-07 NOTE — Progress Notes (Signed)
This patient returns to my office for at risk foot care.  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  This patient is unable to cut nails himself since the patient cannot reach his nails.These nails are painful walking and wearing shoes.  This patient presents for at risk foot care today.  General Appearance  Alert, conversant and in no acute stress.  Vascular  Dorsalis pedis and posterior tibial  pulses are palpable  bilaterally.  Capillary return is within normal limits  bilaterally. Temperature is within normal limits  bilaterally.  Neurologic  Senn-Weinstein monofilament wire test within normal limits  bilaterally. Muscle power within normal limits bilaterally.  Nails Thick disfigured discolored nails with subungual debris  from hallux to fifth toes bilaterally. No evidence of bacterial infection or drainage bilaterally.  Orthopedic  No limitations of motion  feet .  No crepitus or effusions noted.  No bony pathology or digital deformities noted.  Skin  normotropic skin with no porokeratosis noted bilaterally.  No signs of infections or ulcers noted.     Onychomycosis  Pain in right toes  Pain in left toes  Consent was obtained for treatment procedures.   Mechanical debridement of nails 1-5  bilaterally performed with a nail nipper.  Filed with dremel without incident.    Return office visit   10 weeks                   Told patient to return for periodic foot care and evaluation due to potential at risk complications.   Gardiner Barefoot DPM

## 2020-03-07 NOTE — Telephone Encounter (Signed)
  Community Resource Referral   [place] 03/07/2020  1st Attempt  DOB: 03/10/1956   AGE: 64 y.o.   GENDER: male   PCP Guadalupe Maple, MD.   Called pt regarding Community Resource Referral LMTCB Follow up on: 03/08/2020  Notes: Referral placed for orthopedic specialist, but appears patient wasn't in network because he would have to be self pay, and reported he couldn't pay. Chronic pain back a huge issue, and reporting many cost concerns right now (I'm working on medications). Thanks!  [place] . [email or web address]  [phone]

## 2020-03-08 ENCOUNTER — Telehealth: Payer: Self-pay

## 2020-03-08 NOTE — Telephone Encounter (Signed)
  Community Resource Referral   [place] 03/08/2020   DOB: August 08, 1956   AGE: 64 y.o.   GENDER: male    Called pt regarding Community Resource Referral regarding coverage for specialist. 5/19 new referral placed to [facility] specialist by Jolene. Pt stated he had been in contact with Dr. Irven [place] office to get his records transferred to new provider. He also said he has signed up for new coverage to help afford co-pays to specialists, that insurance will begin June 1. Gave my information should he need any further assistance. Closing referral pending any other needs of patient. [place] . [email or web address]  [phone]

## 2020-03-12 ENCOUNTER — Telehealth: Payer: Self-pay | Admitting: Nurse Practitioner

## 2020-03-12 NOTE — Telephone Encounter (Signed)
Copied from [place] [phone]. Topic: General - Other >> Mar 12, 2020  3:54 PM Greggory Keen D wrote: Reason for CRM: Pt called asking [place] to change his pharmacy to [facility] in [place] do not send his prescriptions to [facility].  This takes effect June 1

## 2020-03-13 ENCOUNTER — Encounter: Payer: Self-pay | Admitting: *Deleted

## 2020-03-13 NOTE — Telephone Encounter (Signed)
Noted, thank you

## 2020-03-13 NOTE — Telephone Encounter (Signed)
Pharmacy updated. FYI.

## 2020-03-19 DIAGNOSIS — G894 Chronic pain syndrome: Secondary | ICD-10-CM | POA: Diagnosis not present

## 2020-03-19 DIAGNOSIS — M545 Low back pain: Secondary | ICD-10-CM | POA: Diagnosis not present

## 2020-03-19 DIAGNOSIS — M5136 Other intervertebral disc degeneration, lumbar region: Secondary | ICD-10-CM | POA: Diagnosis not present

## 2020-03-19 MED ORDER — TRESIBA FLEXTOUCH 100 UNIT/ML ~~LOC~~ SOPN: 28.0000 [IU] | PEN_INJECTOR | Freq: Every day | SUBCUTANEOUS | 3 refills | Status: DC

## 2020-03-19 MED ORDER — INSULIN LISPRO (1 UNIT DIAL) 100 UNIT/ML (KWIKPEN): PEN_INJECTOR | SUBCUTANEOUS | 3 refills | Status: DC

## 2020-03-19 NOTE — Addendum Note (Signed)
Addended by: Dorita Sciara on: 03/19/2020 02:41 PM   Modules accepted: Orders

## 2020-03-25 ENCOUNTER — Ambulatory Visit: Payer: Self-pay

## 2020-03-25 NOTE — Chronic Care Management (AMB) (Signed)
Care Management   Follow Up Note   03/25/2020 Name: Edward Schneider. MRN: [id number] DOB: 08/27/1956  Referred by: Marjie Skiff, NP Reason for referral : Care Coordination   [name] is a 64 y.o. year old male who is a primary care patient of Cannady, Dorie Rank, NP. The care management team was consulted for assistance with care management and care coordination needs.    Review of patient status, including review of consultants reports, relevant laboratory and other test results, and collaboration with appropriate care team members and the patient's provider was performed as part of comprehensive patient evaluation and provision of chronic care management services.    LCSW completed CCM outreach attempt today but was unable to reach patient successfully. Patient returned call but LCSW was unable to get to this call in time. LCSW completed an additional outreach attempt but was still unsuccessful in reaching patient. A HIPPA compliant voice message was left encouraging patient to return call once available. LCSW rescheduled CCM SW appointment as well.  A HIPPA compliant phone message was left for the patient providing contact information and requesting a return call.   [name], BSW, MSW, LCSW [facility] Care Management [place]  [facility] [email or web address] Phone: [phone]

## 2020-03-29 ENCOUNTER — Telehealth (INDEPENDENT_AMBULATORY_CARE_PROVIDER_SITE_OTHER): Payer: Self-pay | Admitting: Gastroenterology

## 2020-03-29 ENCOUNTER — Other Ambulatory Visit (INDEPENDENT_AMBULATORY_CARE_PROVIDER_SITE_OTHER): Payer: Self-pay

## 2020-03-29 ENCOUNTER — Telehealth: Payer: Self-pay | Admitting: Internal Medicine

## 2020-03-29 ENCOUNTER — Other Ambulatory Visit: Payer: Self-pay

## 2020-03-29 DIAGNOSIS — Z1211 Encounter for screening for malignant neoplasm of colon: Secondary | ICD-10-CM

## 2020-03-29 NOTE — Telephone Encounter (Signed)
° °  Primary Cardiologist: Nelva Bush, MD  Chart reviewed as part of pre-operative protocol coverage. Given past medical history and time since last visit, based on ACC/AHA guidelines, Edward Schneider. would be at acceptable risk for the planned procedure without further cardiovascular testing.   He may hold his Plavix for 5 days prior to procedure.  Please have him resume his Plavix as soon as hemostasis is achieved.  I will route this recommendation to the requesting party via Epic fax function and remove from pre-op pool.  Please call with questions.  Jossie Ng. Jaja Switalski NP-C    03/29/2020, 2:27 PM [facility] [address] Office [phone] Fax [phone]

## 2020-03-29 NOTE — Telephone Encounter (Signed)
   [facility] Pre-operative Risk Assessment    HEARTCARE STAFF: - Please ensure there is not already an duplicate clearance open for this procedure. - Under Visit Info/Reason for Call, type in Other and utilize the format Clearance MM/DD/YY or Clearance TBD. Do not use dashes or single digits. - If request is for dental extraction, please clarify the # of teeth to be extracted.  Request for surgical clearance:  1. What type of surgery is being performed? Colonoscopy   2. When is this surgery scheduled? 04/15/20  3. What type of clearance is required (medical clearance vs. Pharmacy clearance to hold med vs. Both)? medical  4. Are there any medications that need to be held prior to surgery and how long? Plavix, office needs to be advised for how long plavix should be held  5. Practice name and name of physician performing surgery? [facility] (doctor not noted)  6. What is the office phone number? [phone]   7.   What is the office fax number? [place]  8.   Anesthesia type (None, local, MAC, general) ? Not noted   Marykay Lex 03/29/2020, 2:14 PM  _________________________________________________________________   (provider comments below)

## 2020-04-02 ENCOUNTER — Telehealth: Payer: Self-pay

## 2020-04-02 NOTE — Telephone Encounter (Signed)
LVM for patient advising him per Dr. Darnelle Bos clearance to hold Plavix 5 days prior to colonoscopy scheduled on 04/15/20.  Asked him to call the office back if he has any questions.  Thanks,  [place], [place]

## 2020-04-04 NOTE — Progress Notes (Signed)
Pre procedure assessment

## 2020-04-08 ENCOUNTER — Telehealth: Payer: Self-pay | Admitting: Nurse Practitioner

## 2020-04-08 NOTE — Telephone Encounter (Signed)
Copied from [place] [phone]. Topic: General - Other >> Apr 08, 2020  2:34 PM Celene Kras wrote: Reason for CRM: Pt called stating that his medical supply company denied him his freestyle libre due to his insurance. Pt is requesting to have the prescription sent to an alternative medical supply company. Please advise.

## 2020-04-08 NOTE — Telephone Encounter (Signed)
Called pt and let him know of Jolene's message. Pt verbalized understanding

## 2020-04-08 NOTE — Telephone Encounter (Signed)
I did get a denial once that said "we don't take [employer], but this DME supplier does, so send here". It may be with denial paperwork.   I have a call down for him on Wednesday, so I can do some digging into denial reason as well!  Catie

## 2020-04-08 NOTE — Telephone Encounter (Signed)
Thank you Dr. Kelton Pillar and Barnetta Chapel for your replies.

## 2020-04-08 NOTE — Telephone Encounter (Signed)
Let me see if I can get Catie's help on this.  We will see where we need to send and contact him as soon as possible.

## 2020-04-08 NOTE — Telephone Encounter (Signed)
Endocrinology has been working on this.   Edward Schneider,   Did you get notification of the CGM denial for this patient?

## 2020-04-09 DIAGNOSIS — L638 Other alopecia areata: Secondary | ICD-10-CM | POA: Diagnosis not present

## 2020-04-09 DIAGNOSIS — L281 Prurigo nodularis: Secondary | ICD-10-CM | POA: Diagnosis not present

## 2020-04-10 ENCOUNTER — Ambulatory Visit (INDEPENDENT_AMBULATORY_CARE_PROVIDER_SITE_OTHER): Payer: PPO | Admitting: Pharmacist

## 2020-04-10 DIAGNOSIS — E1165 Type 2 diabetes mellitus with hyperglycemia: Secondary | ICD-10-CM | POA: Diagnosis not present

## 2020-04-10 DIAGNOSIS — F321 Major depressive disorder, single episode, moderate: Secondary | ICD-10-CM | POA: Diagnosis not present

## 2020-04-10 MED ORDER — FREESTYLE LIBRE 14 DAY SENSOR MISC: 1.0000 | 6 refills | Status: DC

## 2020-04-10 MED ORDER — FREESTYLE LIBRE 14 DAY READER DEVI: 1.0000 | 0 refills | Status: DC

## 2020-04-10 NOTE — Chronic Care Management (AMB) (Signed)
Chronic Care Management   Follow Up Note   04/10/2020 Name: Edward Schneider. MRN: [id number] DOB: 1955/10/26  Referred by: [name], NP Reason for referral : Chronic Care Management (Medication Management)   Edward Schneider. is a 64 y.o. year old male who is a primary care patient of Cannady, Barbaraann Faster, NP. The CCM team was consulted for assistance with chronic disease management and care coordination needs.    Contacted patient for medication management review.   Review of patient status, including review of consultants reports, relevant laboratory and other test results, and collaboration with appropriate care team members and the patient's provider was performed as part of comprehensive patient evaluation and provision of chronic care management services.    SDOH (Social Determinants of Health) assessments performed: Yes See Care Plan activities for detailed interventions related to [facility])     Outpatient Encounter Medications as of 04/10/2020  Medication Sig Note  . Accu-Chek Softclix Lancets lancets USE 1  TO CHECK GLUCOSE ONCE DAILY   . albuterol (VENTOLIN HFA) 108 (90 Base) MCG/ACT inhaler Inhale 2 puffs into the lungs every 6 (six) hours as needed for wheezing or shortness of breath.   . Ascorbic Acid (VITAMIN C) 1000 MG tablet Take 1,000 mg by mouth daily.   Marland Kitchen aspirin EC 81 MG EC tablet Take 1 tablet (81 mg total) by mouth daily.   Marland Kitchen atorvastatin (LIPITOR) 80 MG tablet Take 0.5 tablets (40 mg total) by mouth daily.   . benazepril (LOTENSIN) 10 MG tablet Take 1 tablet by mouth once daily   . calcium carbonate (OS-CAL) 600 MG TABS tablet Take 600 mg by mouth daily.   . Cholecalciferol (VITAMIN D3) 25 MCG (1000 UT) CHEW Chew 1 capsule by mouth daily.    Marland Kitchen CINNAMON PO Take 1,000 mg by mouth 2 (two) times daily.    . clopidogrel (PLAVIX) 75 MG tablet Take 1 tablet (75 mg total) by mouth daily with breakfast.   . cyclobenzaprine (FLEXERIL) 10 MG tablet Take 1 tablet (10 mg  total) by mouth 3 (three) times daily as needed for muscle spasms.   . diclofenac sodium (VOLTAREN) 1 % GEL Apply 2 g topically 4 (four) times daily.   . diphenhydrAMINE (BENADRYL) 25 mg capsule Take 25 mg by mouth 2 (two) times daily.    Marland Kitchen doxepin (SINEQUAN) 25 MG capsule Take 25 mg by mouth 2 (two) times daily.    . DULoxetine (CYMBALTA) 30 MG capsule Take 30 mg by mouth daily.    . DULoxetine (CYMBALTA) 60 MG capsule Take 1 capsule (60 mg total) by mouth daily.   Marland Kitchen EPINEPHrine 0.3 mg/0.3 mL IJ SOAJ injection Inject 0.3 mLs (0.3 mg total) into the muscle as needed for anaphylaxis.   . Flaxseed, Linseed, (FLAX SEED OIL) 1000 MG CAPS Take 1,000 mg by mouth daily.   . Ginger, Zingiber officinalis, (GINGER PO) Take 1 Dose by mouth daily.   . Ginseng 100 MG CAPS Take by mouth.   Marland Kitchen glucose blood (ACCU-CHEK GUIDE) test strip Use as instructed to test blood sugar 4 times daily E11.65   . insulin degludec (TRESIBA FLEXTOUCH) 100 UNIT/ML FlexTouch Pen Inject 0.28 mLs (28 Units total) into the skin daily.   . insulin lispro (HUMALOG KWIKPEN) 100 UNIT/ML KwikPen Inject 0.16 mLs (16 Units total) into the skin 2 (two) times daily with breakfast and lunch AND 0.18 mLs (18 Units total) daily with supper.   . Insulin Pen Needle (B-D UF III  MINI PEN NEEDLES) 31G X 5 MM MISC Daily   . isosorbide mononitrate (IMDUR) 60 MG 24 hr tablet Take 1.5 tablets (90 mg total) by mouth daily.   . Multiple Vitamins-Minerals (HAIR SKIN AND NAILS FORMULA PO) Take 1 capsule by mouth daily.   . mupirocin ointment (BACTROBAN) 2 % APPLY OINTMENT TOPICALLY TWICE DAILY   . NARCAN 4 MG/0.1ML LIQD nasal spray kit CALL 911. ADMINISTER A SINGLE SPRAY OF NARCAN IN ONE NOSTRIL. REPEAT EVERY 3 MINUTES AS NEEDED IF NO OR MINIMAL RESPONSE. (Patient not taking: Reported on 03/29/2020)   . nitroGLYCERIN (NITROSTAT) 0.4 MG SL tablet Place 1 tablet (0.4 mg total) under the tongue every 5 (five) minutes as needed for chest pain.   . Omega-3 1000 MG  CAPS Take 1,000 mg by mouth daily.    Marland Kitchen oxyCODONE (ROXICODONE) 15 MG immediate release tablet TAKE 1/2 TO 1 (ONE HALF TO ONE) TABLET BY MOUTH FOUR TO SIX TIMES DAILY IF TOLERATED NOTE TABLET IS 15 MG 09/26/2019: Taking 6 tab per day  . pantoprazole (PROTONIX) 40 MG tablet Take 1 tablet by mouth twice daily   . pregabalin (LYRICA) 50 MG capsule TAKE 1 CAPSULE BY MOUTH THREE TIMES DAILY   . Semaglutide,0.25 or 0.5MG/DOS, (OZEMPIC, 0.25 OR 0.5 MG/DOSE,) 2 MG/1.5ML SOPN Inject 0.5 mg into the skin once a week.   . sucralfate (CARAFATE) 1 g tablet TAKE 1 TABLET BY MOUTH 4 TIMES DAILY... WITH MEALS AND AT BEDTIME   . Tiotropium Bromide-Olodaterol (STIOLTO RESPIMAT) 2.5-2.5 MCG/ACT AERS Inhale 2 puffs into the lungs daily.   . traZODone (DESYREL) 100 MG tablet Take 1 tablet (100 mg total) by mouth at bedtime as needed for sleep.   Marland Kitchen triamcinolone cream (KENALOG) 0.1 % Apply 1 application topically 2 (two) times daily.   . vitamin A 10000 UNIT capsule Take 10,000 Units by mouth daily.   . vitamin B-12 (CYANOCOBALAMIN) 1000 MCG tablet Take 1,000 mcg by mouth daily.   . Vitamin E 400 units TABS Take 400 Units by mouth daily.     No facility-administered encounter medications on file as of 04/10/2020.     Objective:   Goals Addressed              This Visit's Progress     Patient Stated   .  PharmD "I want to stay healthy" (pt-stated)        Current Barriers:  . Diabetes: uncontrolled but significantly improved, complicated by chronic medical conditions including atherosclerosis (s/p DES to LAD on 11/17), most recent A1c 7.9%; follows w/ Dr. Kelton Pillar @ [place] o Patient called in noting that his CGM order was denied by insurance, and needed to be sent elsewhere.  . Current antihyperglycemic regimen: Tresiba 24 units daily, Novolog 16 units TID; Ozempic 0.5 weekly o Hx GI intolerance to metformin o APPROVED for Tresiba, Novolog, and Ozempic through 09/17/20 . Cardiovascular risk reduction-  s/p DES on 09/03/2019; follows w/ Dr. Saunders Revel; o Current hypertensive regimen: benazepril 10 mg daily, isosorbide 90 mg daily o Current hyperlipidemia regimen: atorvastatin 80 mg daily o Current antiplatelet regimen: ASA 81 mg daily, clopidogrel 75 mg daily; plan for DAPT x 12 months, then clopidogrel monotherapy indefinitely. . COPD; Stiolto 2.5/2.5 mg daily. Albuterol HFA PRN.  o APPROVED for Stiolto through patient assistance through 10/18/20 . Chronic pain- follows w/ Dr. Primus Bravo o Doxepin 25 mg BID, duloxetine 30 mg QAM, 30 mg Qafternoon, 60 mg QPM; pregabalin 50 mg TID, oxycodone IR 15 mg Q4H;  cyclobenzaprine 10 mg TID, diclofenac gel PRN  Pharmacist Clinical Goal(s):  Marland Kitchen Over the next 90 days, patient will work with PharmD and primary care provider to address optimized medication management  Interventions: . Comprehensive medication review performed, medication list updated in electronic medical record . Inter-disciplinary care team collaboration (see longitudinal plan of care) . Contacted [facility]. They note that [employer] is out of network for them. Reviewed HTA benefits; historically, [name] has been covered at [employer] under Part D. Collaborated w/ endocrinology, recommended they send prescription for [facility] 2 reader + sensors to [place]. Will follow and help support education and reiteration for patient.  . Contacted patient to discuss. LVM for him to return my call at his convenience .  Patient Self Care Activities:  . Patient will check blood glucose BID , document, and provide at future appointments . Patient will take medications as prescribed . Patient will contact provider with any episodes of hypoglycemia . Patient will report any questions or concerns to provider   Please see past updates related to this goal by clicking on the "Past Updates" button in the selected goal          Plan:  - If I do not hear back from patient, will outreach in 2-3  business days  Catie Darnelle Maffucci, PharmD, [place] [phone]

## 2020-04-10 NOTE — Telephone Encounter (Signed)
Reader and sensors covered for $0 copay at [facility]! Thanks team! I will discuss with the patient when he calls me back

## 2020-04-10 NOTE — Telephone Encounter (Signed)
I had a co-worker check my folder in [place] office where I keep the last 30 days of faxes I have sent and was informed that he does have a fax that was sent to [facility] for a CGM. Other than that I have not received anything for Edward Schneider.

## 2020-04-10 NOTE — Telephone Encounter (Signed)
Contacted [facility] - they are not in network with patient's insurance, [employer], so could not fill CGM.   Reviewed HTA coverage. FreeStyle [name] should be covered at his local pharmacy. Dr. Kelton Pillar, could you just order the reader and sensors to [facility] for him? I will call to follow up and ensure covered.

## 2020-04-10 NOTE — Patient Instructions (Addendum)
Visit Information  Goals Addressed              This Visit's Progress     Patient Stated   .  PharmD "I want to stay healthy" (pt-stated)        Current Barriers:  . Diabetes: uncontrolled but significantly improved, complicated by chronic medical conditions including atherosclerosis (s/p DES to LAD on 11/17), most recent A1c 7.9%; follows w/ Dr. Kelton Pillar @ [place] o Patient called in noting that his CGM order was denied by insurance, and needed to be sent elsewhere.  . Current antihyperglycemic regimen: Tresiba 24 units daily, Novolog 16 units TID; Ozempic 0.5 weekly o Hx GI intolerance to metformin o APPROVED for Tresiba, Novolog, and Ozempic through 09/17/20 . Cardiovascular risk reduction- s/p DES on 09/03/2019; follows w/ Dr. Saunders Revel; o Current hypertensive regimen: benazepril 10 mg daily, isosorbide 90 mg daily o Current hyperlipidemia regimen: atorvastatin 80 mg daily o Current antiplatelet regimen: ASA 81 mg daily, clopidogrel 75 mg daily; plan for DAPT x 12 months, then clopidogrel monotherapy indefinitely. . COPD; Stiolto 2.5/2.5 mg daily. Albuterol HFA PRN.  o APPROVED for Stiolto through patient assistance through 10/18/20 . Chronic pain- follows w/ Dr. Primus Bravo o Doxepin 25 mg BID, duloxetine 30 mg QAM, 30 mg Qafternoon, 60 mg QPM; pregabalin 50 mg TID, oxycodone IR 15 mg Q4H; cyclobenzaprine 10 mg TID, diclofenac gel PRN  Pharmacist Clinical Goal(s):  Marland Kitchen Over the next 90 days, patient will work with PharmD and primary care provider to address optimized medication management  Interventions: . Comprehensive medication review performed, medication list updated in electronic medical record . Inter-disciplinary care team collaboration (see longitudinal plan of care) . Contacted [facility]. They note that [employer] is out of network for them. Reviewed HTA benefits; historically, [name] has been covered at [employer] under Part D. Collaborated w/  endocrinology, recommended they send prescription for [facility] 2 reader + sensors to [place]. Will follow and help support education and reiteration for patient.  . Contacted patient to discuss. Scheduled face to face visit in ~ 2 weeks for CGM administration education  Patient Self Care Activities:  . Patient will check blood glucose BID , document, and provide at future appointments . Patient will take medications as prescribed . Patient will contact provider with any episodes of hypoglycemia . Patient will report any questions or concerns to provider   Please see past updates related to this goal by clicking on the "Past Updates" button in the selected goal         The patient verbalized understanding of instructions provided today and declined a print copy of patient instruction materials.    Plan:  - Scheduled face to face visit in ~ 2 weeks  Catie Darnelle Maffucci, PharmD, [place] [phone]

## 2020-04-10 NOTE — Telephone Encounter (Signed)
Great, thank you Catie!!

## 2020-04-10 NOTE — Addendum Note (Signed)
Addended by: Dorita Sciara on: 04/10/2020 02:50 PM   Modules accepted: Orders

## 2020-04-15 ENCOUNTER — Encounter: Payer: Self-pay | Admitting: Anesthesiology

## 2020-04-15 ENCOUNTER — Other Ambulatory Visit: Payer: Self-pay

## 2020-04-15 ENCOUNTER — Encounter: Payer: Self-pay | Admitting: Gastroenterology

## 2020-04-15 DIAGNOSIS — M5136 Other intervertebral disc degeneration, lumbar region: Secondary | ICD-10-CM | POA: Diagnosis not present

## 2020-04-15 DIAGNOSIS — M545 Low back pain: Secondary | ICD-10-CM | POA: Diagnosis not present

## 2020-04-15 DIAGNOSIS — G894 Chronic pain syndrome: Secondary | ICD-10-CM | POA: Diagnosis not present

## 2020-04-17 ENCOUNTER — Other Ambulatory Visit: Payer: Self-pay

## 2020-04-17 ENCOUNTER — Other Ambulatory Visit
Admission: RE | Admit: 2020-04-17 | Discharge: 2020-04-17 | Disposition: A | Discharge status: Home / Self Care | Payer: PPO | Source: Ambulatory Visit | Attending: Gastroenterology | Admitting: Gastroenterology

## 2020-04-17 DIAGNOSIS — Z01812 Encounter for preprocedural laboratory examination: Secondary | ICD-10-CM | POA: Diagnosis not present

## 2020-04-17 DIAGNOSIS — Z20822 Contact with and (suspected) exposure to covid-19: Secondary | ICD-10-CM | POA: Insufficient documentation

## 2020-04-17 LAB — SARS CORONAVIRUS 2 (TAT 6-24 HRS): SARS Coronavirus 2: NEGATIVE

## 2020-04-17 NOTE — Discharge Instructions (Signed)
General Anesthesia, Adult, Care After This sheet gives you information about how to care for yourself after your procedure. Your health care provider may also give you more specific instructions. If you have problems or questions, contact your health care provider. What can I expect after the procedure? After the procedure, the following side effects are common:  Pain or discomfort at the IV site.  Nausea.  Vomiting.  Sore throat.  Trouble concentrating.  Feeling cold or chills.  Weak or tired.  Sleepiness and fatigue.  Soreness and body aches. These side effects can affect parts of the body that were not involved in surgery. Follow these instructions at home:  For at least 24 hours after the procedure:  Have a responsible adult stay with you. It is important to have someone help care for you until you are awake and alert.  Rest as needed.  Do not: ? Participate in activities in which you could fall or become injured. ? Drive. ? Use heavy machinery. ? Drink alcohol. ? Take sleeping pills or medicines that cause drowsiness. ? Make important decisions or sign legal documents. ? Take care of children on your own. Eating and drinking  Follow any instructions from your health care provider about eating or drinking restrictions.  When you feel hungry, start by eating small amounts of foods that are soft and easy to digest (bland), such as toast. Gradually return to your regular diet.  Drink enough fluid to keep your urine pale yellow.  If you vomit, rehydrate by drinking water, juice, or clear broth. General instructions  If you have sleep apnea, surgery and certain medicines can increase your risk for breathing problems. Follow instructions from your health care provider about wearing your sleep device: ? Anytime you are sleeping, including during daytime naps. ? While taking prescription pain medicines, sleeping medicines, or medicines that make you drowsy.  Return to  your normal activities as told by your health care provider. Ask your health care provider what activities are safe for you.  Take over-the-counter and prescription medicines only as told by your health care provider.  If you smoke, do not smoke without supervision.  Keep all follow-up visits as told by your health care provider. This is important. Contact a health care provider if:  You have nausea or vomiting that does not get better with medicine.  You cannot eat or drink without vomiting.  You have pain that does not get better with medicine.  You are unable to pass urine.  You develop a skin rash.  You have a fever.  You have redness around your IV site that gets worse. Get help right away if:  You have difficulty breathing.  You have chest pain.  You have blood in your urine or stool, or you vomit blood. Summary  After the procedure, it is common to have a sore throat or nausea. It is also common to feel tired.  Have a responsible adult stay with you for the first 24 hours after general anesthesia. It is important to have someone help care for you until you are awake and alert.  When you feel hungry, start by eating small amounts of foods that are soft and easy to digest (bland), such as toast. Gradually return to your regular diet.  Drink enough fluid to keep your urine pale yellow.  Return to your normal activities as told by your health care provider. Ask your health care provider what activities are safe for you. This information is not   intended to replace advice given to you by your health care provider. Make sure you discuss any questions you have with your health care provider. Document Revised: 10/08/2017 Document Reviewed: 05/21/2017 Elsevier Patient Education  2020 Elsevier Inc.  

## 2020-04-18 ENCOUNTER — Other Ambulatory Visit: Payer: Self-pay

## 2020-04-18 ENCOUNTER — Telehealth: Payer: Self-pay

## 2020-04-18 MED ORDER — SUPREP BOWEL PREP KIT 17.5-3.13-1.6 GM/177ML PO SOLN: 1.0000 | ORAL | 0 refills | Status: DC

## 2020-04-18 NOTE — Telephone Encounter (Signed)
LVM with patient to call me back.  He LVM with me yesterday afternoon to cancel his colonoscopy scheduled for tomorrow due to SuPrep cost.  I asked him to call me as soon as possible because I can send a new rx to [place] on [address] for the alternative prep which is cheaper if he would like to do so.  I will wait for him to call me back before canceling his procedure.  Thanks,  [place], [place]

## 2020-04-18 NOTE — Telephone Encounter (Signed)
Colonoscopy canceled per patient request due to cost of bowel prep.  LVM to let him know that his message was received and his procedure has been canceled. Debbie at [facility] has been notified to cancel his colonoscopy.  Thanks,  [place], [place]

## 2020-04-19 ENCOUNTER — Ambulatory Visit: Admission: RE | Admit: 2020-04-19 | Payer: PPO | Source: Home / Self Care | Admitting: Gastroenterology

## 2020-04-19 SURGERY — COLONOSCOPY WITH PROPOFOL
Anesthesia: Choice

## 2020-04-19 MED ORDER — PROPOFOL 10 MG/ML IV BOLUS
INTRAVENOUS | Status: AC
  Filled 2020-04-19: qty 20

## 2020-04-19 MED ORDER — SEVOFLURANE IN SOLN
RESPIRATORY_TRACT | Status: AC
  Filled 2020-04-19: qty 250

## 2020-04-22 ENCOUNTER — Telehealth: Payer: Self-pay

## 2020-04-23 ENCOUNTER — Other Ambulatory Visit: Payer: Self-pay | Admitting: Internal Medicine

## 2020-04-23 ENCOUNTER — Ambulatory Visit (INDEPENDENT_AMBULATORY_CARE_PROVIDER_SITE_OTHER): Payer: PPO | Admitting: Pharmacist

## 2020-04-23 DIAGNOSIS — E1165 Type 2 diabetes mellitus with hyperglycemia: Secondary | ICD-10-CM

## 2020-04-23 DIAGNOSIS — F321 Major depressive disorder, single episode, moderate: Secondary | ICD-10-CM

## 2020-04-23 NOTE — Chronic Care Management (AMB) (Signed)
Chronic Care Management   Follow Up Note   04/23/2020 Name: Edward Schneider. MRN: [id number] DOB: Feb 23, 1956  Referred by: [name], NP Reason for referral : Chronic Care Management (Medication Management)   Edward Schneider. is a 64 y.o. year old male who is a primary care patient of Cannady, Barbaraann Faster, NP. The CCM team was consulted for assistance with chronic disease management and care coordination needs.    Met with patient face to face for device teaching  Review of patient status, including review of consultants reports, relevant laboratory and other test results, and collaboration with appropriate care team members and the patient's provider was performed as part of comprehensive patient evaluation and provision of chronic care management services.    SDOH (Social Determinants of Health) assessments performed: No See Care Plan activities for detailed interventions related to [facility])     Outpatient Encounter Medications as of 04/23/2020  Medication Sig Note  . Accu-Chek Softclix Lancets lancets USE 1  TO CHECK GLUCOSE ONCE DAILY   . albuterol (VENTOLIN HFA) 108 (90 Base) MCG/ACT inhaler Inhale 2 puffs into the lungs every 6 (six) hours as needed for wheezing or shortness of breath.   . Ascorbic Acid (VITAMIN C) 1000 MG tablet Take 1,000 mg by mouth daily.   Marland Kitchen aspirin EC 81 MG EC tablet Take 1 tablet (81 mg total) by mouth daily.   Marland Kitchen atorvastatin (LIPITOR) 80 MG tablet Take 0.5 tablets (40 mg total) by mouth daily.   . benazepril (LOTENSIN) 10 MG tablet Take 1 tablet by mouth once daily   . calcium carbonate (OS-CAL) 600 MG TABS tablet Take 600 mg by mouth daily.   . Cholecalciferol (VITAMIN D3) 25 MCG (1000 UT) CHEW Chew 1 capsule by mouth daily.    Marland Kitchen CINNAMON PO Take 1,000 mg by mouth 2 (two) times daily.    . clopidogrel (PLAVIX) 75 MG tablet Take 1 tablet (75 mg total) by mouth daily with breakfast.   . Continuous Blood Gluc Receiver (FREESTYLE LIBRE 14 DAY READER) DEVI  1 Device by Does not apply route as directed.   . Continuous Blood Gluc Sensor (FREESTYLE LIBRE 14 DAY SENSOR) MISC 1 Device by Does not apply route as directed.   . cyclobenzaprine (FLEXERIL) 10 MG tablet Take 1 tablet (10 mg total) by mouth 3 (three) times daily as needed for muscle spasms.   . diclofenac sodium (VOLTAREN) 1 % GEL Apply 2 g topically 4 (four) times daily.   . diphenhydrAMINE (BENADRYL) 25 mg capsule Take 25 mg by mouth 2 (two) times daily.    Marland Kitchen doxepin (SINEQUAN) 25 MG capsule Take 25 mg by mouth 2 (two) times daily.    . DULoxetine (CYMBALTA) 30 MG capsule Take 30 mg by mouth daily.    . DULoxetine (CYMBALTA) 60 MG capsule Take 1 capsule (60 mg total) by mouth daily.   Marland Kitchen EPINEPHrine 0.3 mg/0.3 mL IJ SOAJ injection Inject 0.3 mLs (0.3 mg total) into the muscle as needed for anaphylaxis.   . Ginger, Zingiber officinalis, (GINGER PO) Take 1 Dose by mouth daily.   . Ginseng 100 MG CAPS Take by mouth.   Marland Kitchen glucose blood (ACCU-CHEK GUIDE) test strip Use as instructed to test blood sugar 4 times daily E11.65   . insulin degludec (TRESIBA FLEXTOUCH) 100 UNIT/ML FlexTouch Pen Inject 0.28 mLs (28 Units total) into the skin daily.   . insulin lispro (HUMALOG KWIKPEN) 100 UNIT/ML KwikPen Inject 0.16 mLs (16 Units total)  into the skin 2 (two) times daily with breakfast and lunch AND 0.18 mLs (18 Units total) daily with supper.   . Insulin Pen Needle (B-D UF III MINI PEN NEEDLES) 31G X 5 MM MISC Daily   . isosorbide mononitrate (IMDUR) 60 MG 24 hr tablet Take 1.5 tablets (90 mg total) by mouth daily.   . Multiple Vitamins-Minerals (HAIR SKIN AND NAILS FORMULA PO) Take 1 capsule by mouth daily. (Patient not taking: Reported on 04/15/2020)   . mupirocin ointment (BACTROBAN) 2 % APPLY OINTMENT TOPICALLY TWICE DAILY   . Na Sulfate-K Sulfate-Mg Sulf (SUPREP BOWEL PREP KIT) 17.5-3.13-1.6 GM/177ML SOLN Take 1 kit by mouth as directed.   Marland Kitchen NARCAN 4 MG/0.1ML LIQD nasal spray kit CALL 911. ADMINISTER A  SINGLE SPRAY OF NARCAN IN ONE NOSTRIL. REPEAT EVERY 3 MINUTES AS NEEDED IF NO OR MINIMAL RESPONSE. (Patient not taking: Reported on 03/29/2020)   . nitroGLYCERIN (NITROSTAT) 0.4 MG SL tablet Place 1 tablet (0.4 mg total) under the tongue every 5 (five) minutes as needed for chest pain.   . Omega-3 1000 MG CAPS Take 1,000 mg by mouth daily.    Marland Kitchen oxyCODONE (ROXICODONE) 15 MG immediate release tablet TAKE 1/2 TO 1 (ONE HALF TO ONE) TABLET BY MOUTH FOUR TO SIX TIMES DAILY IF TOLERATED NOTE TABLET IS 15 MG 09/26/2019: Taking 6 tab per day  . pantoprazole (PROTONIX) 40 MG tablet Take 1 tablet by mouth twice daily   . pregabalin (LYRICA) 50 MG capsule TAKE 1 CAPSULE BY MOUTH THREE TIMES DAILY   . Semaglutide,0.25 or 0.5MG/DOS, (OZEMPIC, 0.25 OR 0.5 MG/DOSE,) 2 MG/1.5ML SOPN Inject 0.5 mg into the skin once a week.   . sucralfate (CARAFATE) 1 g tablet TAKE 1 TABLET BY MOUTH 4 TIMES DAILY... WITH MEALS AND AT BEDTIME   . Tiotropium Bromide-Olodaterol (STIOLTO RESPIMAT) 2.5-2.5 MCG/ACT AERS Inhale 2 puffs into the lungs daily.   . traZODone (DESYREL) 100 MG tablet Take 1 tablet (100 mg total) by mouth at bedtime as needed for sleep.   Marland Kitchen triamcinolone cream (KENALOG) 0.1 % Apply 1 application topically 2 (two) times daily.   . vitamin A 10000 UNIT capsule Take 10,000 Units by mouth daily.   . vitamin B-12 (CYANOCOBALAMIN) 1000 MCG tablet Take 1,000 mcg by mouth daily.   . Vitamin E 400 units TABS Take 400 Units by mouth daily.     No facility-administered encounter medications on file as of 04/23/2020.     Objective:   Goals Addressed              This Visit's Progress     Patient Stated   .  PharmD "I want to stay healthy" (pt-stated)        Current Barriers:  . Diabetes: uncontrolled but significantly improved, complicated by chronic medical conditions including atherosclerosis (s/p DES to LAD on 11/17), most recent A1c 7.9%; follows w/ Dr. Kelton Pillar @ [place] today for [employer]  o Reports he had to cancel his colonoscopy d/t cost of the prep solution. o Reports significant financial burden w/ car payments, house payments.  . Current antihyperglycemic regimen: Tresiba 24 units daily, Novolog 16 units TID; Ozempic 0.5 weekly o Hx GI intolerance to metformin o APPROVED for Tresiba, Novolog, and Ozempic through 09/17/20 . Cardiovascular risk reduction- s/p DES on 09/03/2019; follows w/ Dr. Saunders Revel; o Current hypertensive regimen: benazepril 10 mg daily, isosorbide 90 mg daily o Current hyperlipidemia regimen: atorvastatin 80 mg daily o Current antiplatelet  regimen: ASA 81 mg daily, clopidogrel 75 mg daily; plan for DAPT x 12 months, then clopidogrel monotherapy indefinitely. . COPD; Stiolto 2.5/2.5 mg daily. Albuterol HFA PRN.  o APPROVED for Stiolto through patient assistance through 10/18/20 o Reports he has not received a Stiolto refill in a while, even though he has called . Chronic pain- follows w/ Dr. Primus Bravo o Doxepin 25 mg BID, duloxetine 30 mg QAM, 30 mg Qafternoon, 60 mg QPM; pregabalin 50 mg TID, oxycodone IR 15 mg Q4H; cyclobenzaprine 10 mg TID, diclofenac gel PRN  Pharmacist Clinical Goal(s):  Marland Kitchen Over the next 90 days, patient will work with PharmD and primary care provider to address optimized medication management  Interventions: . Comprehensive medication review performed, medication list updated in electronic medical record . Inter-disciplinary care team collaboration (see longitudinal plan of care) . Educated patient on use of LibreLink app. Educated on CGM sensor placement. Patient placed first sensor while in office. Answered all questions.  . Reviewed [employer] formulary. Tier 1 bowel prep options include Gavilyte (C, G, and N), Trilyte, peg 3350-kcl-na bicarb-nacl oral solution reconstituted 420 g. Will message GI to see if any of these are appropriate bowel prep options . Placing Care Guide referral for financial  concerns . Called [facility] to see if there are issues w/ Stiolto refills. They are placing refill reorder today. Should arrive to patient in 5-7 business days.  Patient Self Care Activities:  . Patient will check blood glucose BID , document, and provide at future appointments . Patient will take medications as prescribed . Patient will contact provider with any episodes of hypoglycemia . Patient will report any questions or concerns to provider   Please see past updates related to this goal by clicking on the "Past Updates" button in the selected goal          Plan:  - Will outreach as previously scheduled  Catie Darnelle Maffucci, PharmD, [place] [phone]

## 2020-04-23 NOTE — Patient Instructions (Addendum)
Visit Information  Goals Addressed              This Visit's Progress     Patient Stated   .  PharmD "I want to stay healthy" (pt-stated)        Current Barriers:  . Diabetes: uncontrolled but significantly improved, complicated by chronic medical conditions including atherosclerosis (s/p DES to LAD on 11/17), most recent A1c 7.9%; follows w/ Dr. Kelton Pillar @ [place] today for [employer]  o Reports he had to cancel his colonoscopy d/t cost of the prep solution. o Reports significant financial burden w/ car payments, house payments.  . Current antihyperglycemic regimen: Tresiba 24 units daily, Novolog 16 units TID; Ozempic 0.5 weekly o Hx GI intolerance to metformin o APPROVED for Tresiba, Novolog, and Ozempic through 09/17/20 . Cardiovascular risk reduction- s/p DES on 09/03/2019; follows w/ Dr. Saunders Revel; o Current hypertensive regimen: benazepril 10 mg daily, isosorbide 90 mg daily o Current hyperlipidemia regimen: atorvastatin 80 mg daily o Current antiplatelet regimen: ASA 81 mg daily, clopidogrel 75 mg daily; plan for DAPT x 12 months, then clopidogrel monotherapy indefinitely. . COPD; Stiolto 2.5/2.5 mg daily. Albuterol HFA PRN.  o APPROVED for Stiolto through patient assistance through 10/18/20 o Reports he has not received a Stiolto refill in a while, even though he has called . Chronic pain- follows w/ Dr. Primus Bravo o Doxepin 25 mg BID, duloxetine 30 mg QAM, 30 mg Qafternoon, 60 mg QPM; pregabalin 50 mg TID, oxycodone IR 15 mg Q4H; cyclobenzaprine 10 mg TID, diclofenac gel PRN  Pharmacist Clinical Goal(s):  Marland Kitchen Over the next 90 days, patient will work with PharmD and primary care provider to address optimized medication management  Interventions: . Comprehensive medication review performed, medication list updated in electronic medical record . Inter-disciplinary care team collaboration (see longitudinal plan of care) . Educated patient on use of  LibreLink app. Educated on CGM sensor placement. Patient placed first sensor while in office. Answered all questions.  . Reviewed [employer] formulary. Tier 1 bowel prep options include Gavilyte (C, G, and N), Trilyte, peg 3350-kcl-na bicarb-nacl oral solution reconstituted 420 g. Will message GI to see if any of these are appropriate bowel prep options . Placing Care Guide referral for financial concerns . Called [facility] to see if there are issues w/ Stiolto refills. They are placing refill reorder today. Should arrive to patient in 5-7 business days.  Patient Self Care Activities:  . Patient will check blood glucose BID , document, and provide at future appointments . Patient will take medications as prescribed . Patient will contact provider with any episodes of hypoglycemia . Patient will report any questions or concerns to provider   Please see past updates related to this goal by clicking on the "Past Updates" button in the selected goal         The patient verbalized understanding of instructions provided today and declined a print copy of patient instruction materials.    Plan:  - Will outreach as previously scheduled  Catie Darnelle Maffucci, PharmD, [place] [phone]

## 2020-04-25 ENCOUNTER — Other Ambulatory Visit: Payer: Self-pay | Admitting: Nurse Practitioner

## 2020-04-25 ENCOUNTER — Telehealth: Payer: Self-pay

## 2020-04-25 ENCOUNTER — Telehealth: Payer: Self-pay | Admitting: Internal Medicine

## 2020-04-25 DIAGNOSIS — Z87891 Personal history of nicotine dependence: Secondary | ICD-10-CM

## 2020-04-25 DIAGNOSIS — Z122 Encounter for screening for malignant neoplasm of respiratory organs: Secondary | ICD-10-CM

## 2020-04-25 NOTE — Telephone Encounter (Signed)
Called pt and lft vm informing him that he is on the patient assistance program and that they supply that medication so he should contact them to see when it was shipped and to call me back if he had any other questions.

## 2020-04-25 NOTE — Telephone Encounter (Signed)
Medication Refill Request  Did you call your pharmacy and request this refill first? No   If patient has not contacted pharmacy first, instruct them to do so for future refills.   Remind them that contacting the pharmacy for their refill is the quickest method to get the refill.   Refill policy also stated that it will take anywhere between 24-72 hours to receive the refill.    Name of medication? Novolog Flex Pen   I did not see this on medication list, but patient was insistent that this is the medication that he needs refilled) patient states that he only has one pen left, needs medication filled, and that it is supplied (mailed to office) to [place] and he comes to pick it up. Could not provide any additional information.

## 2020-04-25 NOTE — Telephone Encounter (Signed)
Patient calling to speak with [facility] regarding diabetic supplies. Patient states supplier will no longer cover needles he needs

## 2020-04-25 NOTE — Telephone Encounter (Signed)
Message left notifying patient that it is time to schedule the low dose lung cancer screening CT scan.  Instructed patient to return call to Shawn Perkins at [phone] to verify information prior to CT scan being scheduled.    

## 2020-04-26 ENCOUNTER — Ambulatory Visit: Payer: Self-pay | Admitting: Pharmacist

## 2020-04-26 DIAGNOSIS — F321 Major depressive disorder, single episode, moderate: Secondary | ICD-10-CM

## 2020-04-26 DIAGNOSIS — E1165 Type 2 diabetes mellitus with hyperglycemia: Secondary | ICD-10-CM

## 2020-04-26 NOTE — Patient Instructions (Signed)
Visit Information  Goals Addressed              This Visit's Progress     Patient Stated   .  PharmD "I want to stay healthy" (pt-stated)        Current Barriers:  . Diabetes: uncontrolled but significantly improved, complicated by chronic medical conditions including atherosclerosis (s/p DES to LAD on 11/17), most recent A1c 7.9%; follows w/ Dr. Kelton Pillar @ [place] o Patient calls today needing help navigating how to get Novolog refill from [employer] [facility]. He reports he has ~1.5 pens left (~8 day supply) . Current antihyperglycemic regimen: Tresiba 24 units daily, Novolog 16 units TID; Ozempic 0.5 weekly o Hx GI intolerance to metformin o APPROVED for Tresiba, Novolog, and Ozempic through 09/17/20 . Cardiovascular risk reduction- s/p DES on 09/03/2019; follows w/ Dr. Saunders Revel; o Current hypertensive regimen: benazepril 10 mg daily, isosorbide 90 mg daily o Current hyperlipidemia regimen: atorvastatin 80 mg daily o Current antiplatelet regimen: ASA 81 mg daily, clopidogrel 75 mg daily; plan for DAPT x 12 months, then clopidogrel monotherapy indefinitely. . COPD; Stiolto 2.5/2.5 mg daily. Albuterol HFA PRN.  o APPROVED for Stiolto through patient assistance through 10/18/20 . Chronic pain- follows w/ Dr. Primus Bravo o Doxepin 25 mg BID, duloxetine 30 mg QAM, 30 mg Qafternoon, 60 mg QPM; pregabalin 50 mg TID, oxycodone IR 15 mg Q4H; cyclobenzaprine 10 mg TID, diclofenac gel PRN  Pharmacist Clinical Goal(s):  Marland Kitchen Over the next 90 days, patient will work with PharmD and primary care provider to address optimized medication management  Interventions: . Comprehensive medication review performed, medication list updated in electronic medical record . Inter-disciplinary care team collaboration (see longitudinal plan of care) . Communicated to Dr. Kelton Pillar and CMA that they need to fax [place] refill fax into [employer] for [employer]. Requested they send a prescription for Novolog into [facility]  now. Provided patient with instructions to access an Immediate Supply coupon from [employer] [phone]) while awaiting on his refill.   Patient Self Care Activities:  . Patient will check blood glucose BID , document, and provide at future appointments . Patient will take medications as prescribed . Patient will contact provider with any episodes of hypoglycemia . Patient will report any questions or concerns to provider   Please see past updates related to this goal by clicking on the "Past Updates" button in the selected goal         The patient verbalized understanding of instructions provided today and declined a print copy of patient instruction materials.   Plan:  - Will outreach patient as previously scheduled    Catie Darnelle Maffucci, PharmD, [place] [phone]

## 2020-04-26 NOTE — Chronic Care Management (AMB) (Addendum)
Chronic Care Management   Follow Up Note   04/26/2020 Name: Srikar Chiang. MRN: [id number] DOB: 1955-12-09  Referred by: [name], NP Reason for referral : Chronic Care Management (Medication Management)   Edmond Ginsberg. is a 64 y.o. year old male who is a primary care patient of Cannady, Barbaraann Faster, NP. The CCM team was consulted for assistance with chronic disease management and care coordination needs.    Contacted patient for medication management support.   Review of patient status, including review of consultants reports, relevant laboratory and other test results, and collaboration with appropriate care team members and the patient's provider was performed as part of comprehensive patient evaluation and provision of chronic care management services.    SDOH (Social Determinants of Health) assessments performed: Yes See Care Plan activities for detailed interventions related to SDOH)  SDOH Interventions     Most Recent Value  SDOH Interventions  Financial Strain Interventions Other (Comment)  [patient assistance]       Outpatient Encounter Medications as of 04/26/2020  Medication Sig Note  . Accu-Chek Softclix Lancets lancets USE 1  TO CHECK GLUCOSE ONCE DAILY   . albuterol (VENTOLIN HFA) 108 (90 Base) MCG/ACT inhaler Inhale 2 puffs into the lungs every 6 (six) hours as needed for wheezing or shortness of breath.   . Ascorbic Acid (VITAMIN C) 1000 MG tablet Take 1,000 mg by mouth daily.   Marland Kitchen aspirin EC 81 MG EC tablet Take 1 tablet (81 mg total) by mouth daily.   Marland Kitchen atorvastatin (LIPITOR) 80 MG tablet Take 0.5 tablets (40 mg total) by mouth daily.   . benazepril (LOTENSIN) 10 MG tablet Take 1 tablet by mouth once daily   . calcium carbonate (OS-CAL) 600 MG TABS tablet Take 600 mg by mouth daily.   . Cholecalciferol (VITAMIN D3) 25 MCG (1000 UT) CHEW Chew 1 capsule by mouth daily.    Marland Kitchen CINNAMON PO Take 1,000 mg by mouth 2 (two) times daily.    . clopidogrel (PLAVIX)  75 MG tablet Take 1 tablet (75 mg total) by mouth daily with breakfast.   . Continuous Blood Gluc Receiver (FREESTYLE LIBRE 14 DAY READER) DEVI 1 Device by Does not apply route as directed.   . Continuous Blood Gluc Sensor (FREESTYLE LIBRE 14 DAY SENSOR) MISC 1 Device by Does not apply route as directed.   . cyclobenzaprine (FLEXERIL) 10 MG tablet Take 1 tablet (10 mg total) by mouth 3 (three) times daily as needed for muscle spasms.   . diclofenac sodium (VOLTAREN) 1 % GEL Apply 2 g topically 4 (four) times daily.   . diphenhydrAMINE (BENADRYL) 25 mg capsule Take 25 mg by mouth 2 (two) times daily.    Marland Kitchen doxepin (SINEQUAN) 25 MG capsule Take 25 mg by mouth 2 (two) times daily.    . DULoxetine (CYMBALTA) 30 MG capsule Take 30 mg by mouth daily.    . DULoxetine (CYMBALTA) 60 MG capsule Take 1 capsule (60 mg total) by mouth daily.   Marland Kitchen EPINEPHrine 0.3 mg/0.3 mL IJ SOAJ injection Inject 0.3 mLs (0.3 mg total) into the muscle as needed for anaphylaxis.   . Ginger, Zingiber officinalis, (GINGER PO) Take 1 Dose by mouth daily.   . Ginseng 100 MG CAPS Take by mouth.   Marland Kitchen glucose blood (ACCU-CHEK GUIDE) test strip Use as instructed to test blood sugar 4 times daily E11.65   . insulin degludec (TRESIBA FLEXTOUCH) 100 UNIT/ML FlexTouch Pen Inject 0.28 mLs (28  Units total) into the skin daily.   . insulin lispro (HUMALOG KWIKPEN) 100 UNIT/ML KwikPen Inject 0.16 mLs (16 Units total) into the skin 2 (two) times daily with breakfast and lunch AND 0.18 mLs (18 Units total) daily with supper.   . Insulin Pen Needle (B-D UF III MINI PEN NEEDLES) 31G X 5 MM MISC Daily   . isosorbide mononitrate (IMDUR) 60 MG 24 hr tablet Take 1.5 tablets (90 mg total) by mouth daily.   . Multiple Vitamins-Minerals (HAIR SKIN AND NAILS FORMULA PO) Take 1 capsule by mouth daily. (Patient not taking: Reported on 04/15/2020)   . mupirocin ointment (BACTROBAN) 2 % APPLY OINTMENT TOPICALLY TWICE DAILY   . Na Sulfate-K Sulfate-Mg Sulf (SUPREP  BOWEL PREP KIT) 17.5-3.13-1.6 GM/177ML SOLN Take 1 kit by mouth as directed.   Marland Kitchen NARCAN 4 MG/0.1ML LIQD nasal spray kit CALL 911. ADMINISTER A SINGLE SPRAY OF NARCAN IN ONE NOSTRIL. REPEAT EVERY 3 MINUTES AS NEEDED IF NO OR MINIMAL RESPONSE. (Patient not taking: Reported on 03/29/2020)   . nitroGLYCERIN (NITROSTAT) 0.4 MG SL tablet Place 1 tablet (0.4 mg total) under the tongue every 5 (five) minutes as needed for chest pain.   . Omega-3 1000 MG CAPS Take 1,000 mg by mouth daily.    Marland Kitchen oxyCODONE (ROXICODONE) 15 MG immediate release tablet TAKE 1/2 TO 1 (ONE HALF TO ONE) TABLET BY MOUTH FOUR TO SIX TIMES DAILY IF TOLERATED NOTE TABLET IS 15 MG 09/26/2019: Taking 6 tab per day  . pantoprazole (PROTONIX) 40 MG tablet Take 1 tablet by mouth twice daily   . pregabalin (LYRICA) 50 MG capsule TAKE 1 CAPSULE BY MOUTH THREE TIMES DAILY   . Semaglutide,0.25 or 0.5MG/DOS, (OZEMPIC, 0.25 OR 0.5 MG/DOSE,) 2 MG/1.5ML SOPN Inject 0.5 mg into the skin once a week.   . sucralfate (CARAFATE) 1 g tablet TAKE 1 TABLET BY MOUTH 4 TIMES DAILY... WITH MEALS AND AT BEDTIME   . Tiotropium Bromide-Olodaterol (STIOLTO RESPIMAT) 2.5-2.5 MCG/ACT AERS Inhale 2 puffs into the lungs daily.   . traZODone (DESYREL) 100 MG tablet Take 1 tablet (100 mg total) by mouth at bedtime as needed for sleep.   Marland Kitchen triamcinolone cream (KENALOG) 0.1 % Apply 1 application topically 2 (two) times daily.   . vitamin A 10000 UNIT capsule Take 10,000 Units by mouth daily.   . vitamin B-12 (CYANOCOBALAMIN) 1000 MCG tablet Take 1,000 mcg by mouth daily.   . Vitamin E 400 units TABS Take 400 Units by mouth daily.     No facility-administered encounter medications on file as of 04/26/2020.     Objective:   Goals Addressed              This Visit's Progress     Patient Stated   .  PharmD "I want to stay healthy" (pt-stated)        Current Barriers:  . Diabetes: uncontrolled but significantly improved, complicated by chronic medical conditions  including atherosclerosis (s/p DES to LAD on 11/17), most recent A1c 7.9%; follows w/ Dr. Kelton Pillar @ [facility] o Patient calls today needing help navigating how to get Novolog refill from [facility]. He reports he has ~1.5 pens left (~8 day supply) . Current antihyperglycemic regimen: Tresiba 24 units daily, Novolog 16 units TID; Ozempic 0.5 weekly o Hx GI intolerance to metformin o APPROVED for Tresiba, Novolog, and Ozempic through 09/17/20 . Cardiovascular risk reduction- s/p DES on 09/03/2019; follows w/ Dr. Saunders Revel; o Current hypertensive regimen: benazepril 10 mg daily, isosorbide  90 mg daily o Current hyperlipidemia regimen: atorvastatin 80 mg daily o Current antiplatelet regimen: ASA 81 mg daily, clopidogrel 75 mg daily; plan for DAPT x 12 months, then clopidogrel monotherapy indefinitely. . COPD; Stiolto 2.5/2.5 mg daily. Albuterol HFA PRN.  o APPROVED for Stiolto through patient assistance through 10/18/20 . Chronic pain- follows w/ Dr. Primus Bravo o Doxepin 25 mg BID, duloxetine 30 mg QAM, 30 mg Qafternoon, 60 mg QPM; pregabalin 50 mg TID, oxycodone IR 15 mg Q4H; cyclobenzaprine 10 mg TID, diclofenac gel PRN  Pharmacist Clinical Goal(s):  Marland Kitchen Over the next 90 days, patient will work with PharmD and primary care provider to address optimized medication management  Interventions: . Comprehensive medication review performed, medication list updated in electronic medical record . Inter-disciplinary care team collaboration (see longitudinal plan of care) . Communicated to Dr. Kelton Pillar and CMA that they need to fax [place] refill fax into [employer] for [facility]. Requested they send a prescription for Novolog into [facility] now. Provided patient with instructions to access an Immediate Supply coupon from [facility] [employer] [phone]) while awaiting on his refill.   Patient Self Care Activities:  . Patient will check blood glucose BID , document, and provide at future  appointments . Patient will take medications as prescribed . Patient will contact provider with any episodes of hypoglycemia . Patient will report any questions or concerns to provider   Please see past updates related to this goal by clicking on the "Past Updates" button in the selected goal          Plan:  - Will outreach patient as previously scheduled    Catie Darnelle Maffucci, PharmD, [place] [phone]

## 2020-04-29 MED ORDER — ISOSORBIDE MONONITRATE ER 60 MG PO TB24: 90.0000 mg | ORAL_TABLET | Freq: Every day | ORAL | 1 refills | Status: DC

## 2020-04-29 NOTE — Telephone Encounter (Signed)
Requested medication (s) are due for refill today  yes  Requested medication (s) are on the active medication list: yes  Last refill:  12/05/19  # 135 1 refill  Future visit scheduled:yes  Notes to clinic: last ordered by cardiology  med had end date 04/15/20    Requested Prescriptions  Pending Prescriptions Disp Refills   isosorbide mononitrate (IMDUR) 60 MG 24 hr tablet 135 tablet 1    Sig: Take 1.5 tablets (90 mg total) by mouth daily.      Cardiovascular:  Nitrates Passed - 04/29/2020  2:47 PM      Passed - Last BP in normal range    BP Readings from Last 1 Encounters:  03/06/20 (!) 144/84          Passed - Last Heart Rate in normal range    Pulse Readings from Last 1 Encounters:  03/06/20 76          Passed - Valid encounter within last 12 months    Recent Outpatient Visits           1 month ago Uncontrolled type 2 diabetes mellitus with hyperglycemia ([place])   [facility] [place], [place] T, NP   3 months ago Hypertension associated with diabetes ([place])   [place], Jolene T, NP   4 months ago Hypotension due to drugs   [employer], Jolene T, NP   4 months ago Type 2 diabetes mellitus with hyperglycemia, with long-term current use of insulin ([place])   [place], [place] T, NP   6 months ago Right arm pain   [place], [place], DO       Future Appointments             In 4 days Sharolyn Douglas, Clance Boll, NP [facility], [place]   In 3 weeks [place], Barbaraann Faster, NP [facility]

## 2020-04-29 NOTE — Telephone Encounter (Signed)
Pt called requesting a refill on isosorbide mononitrate (IMDUR) 60 mg tablets [facility] [address], [place] PT# [phone]

## 2020-04-30 NOTE — Addendum Note (Signed)
Addended by: Lieutenant Diego on: 04/30/2020 11:07 AM   Modules accepted: Orders

## 2020-04-30 NOTE — Telephone Encounter (Signed)
Patient has been notified that annual lung cancer screening low dose CT scan is due currently or will be in near future. Confirmed that patient is within the age range of 55-77, and asymptomatic, (no signs or symptoms of lung cancer). Patient denies illness that would prevent curative treatment for lung cancer if found. Verified smoking history, (former, quit 07/04/12, 100 pack year). The shared decision making visit was done 05/08/20 at 1125 am. Patient is agreeable for CT scan being scheduled.

## 2020-05-03 ENCOUNTER — Ambulatory Visit: Payer: Medicare Other | Admitting: Nurse Practitioner

## 2020-05-07 ENCOUNTER — Other Ambulatory Visit: Payer: Self-pay | Admitting: *Deleted

## 2020-05-07 ENCOUNTER — Other Ambulatory Visit: Payer: Self-pay

## 2020-05-07 ENCOUNTER — Encounter: Payer: Self-pay | Admitting: Family

## 2020-05-07 ENCOUNTER — Ambulatory Visit (INDEPENDENT_AMBULATORY_CARE_PROVIDER_SITE_OTHER): Payer: PPO | Admitting: Family

## 2020-05-07 VITALS — BP 120/70 | HR 90 | Ht 68.0 in | Wt 250.0 lb

## 2020-05-07 DIAGNOSIS — I208 Other forms of angina pectoris: Secondary | ICD-10-CM

## 2020-05-07 DIAGNOSIS — E785 Hyperlipidemia, unspecified: Secondary | ICD-10-CM | POA: Diagnosis not present

## 2020-05-07 DIAGNOSIS — Z79899 Other long term (current) drug therapy: Secondary | ICD-10-CM | POA: Diagnosis not present

## 2020-05-07 DIAGNOSIS — Z6838 Body mass index (BMI) 38.0-38.9, adult: Secondary | ICD-10-CM

## 2020-05-07 DIAGNOSIS — I10 Essential (primary) hypertension: Secondary | ICD-10-CM | POA: Diagnosis not present

## 2020-05-07 DIAGNOSIS — I25118 Atherosclerotic heart disease of native coronary artery with other forms of angina pectoris: Secondary | ICD-10-CM

## 2020-05-07 MED ORDER — ATORVASTATIN CALCIUM 80 MG PO TABS
80.0000 mg | ORAL_TABLET | Freq: Every day | ORAL | 3 refills | Status: DC
Start: 2020-05-07 — End: 2021-02-21
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
## 2020-05-07 NOTE — Patient Instructions (Addendum)
Medication Instructions:  No medication changes today.  Continue Atorvastatin 80mg  daily.   *If you need a refill on your cardiac medications before your next appointment, please call your pharmacy*  Lab Work: Your physician recommends that you return for FASTING lab work in: one month for lipid panel/ liver panel  -  Present to the [facility] approximately August 18th for lab work.  - Please be fasting for this blood work (don't eat breakfast, no need to fast 24 hours, 8 hours is sufficient to fast). -  They are open Monday- Friday  (7:30 am - 5:30 pm)   If you have labs (blood work) drawn today and your tests are completely normal, you will receive your results only by: Marland Kitchen MyChart Message (if you have MyChart) OR . A paper copy in the mail If you have any lab test that is abnormal or we need to change your treatment, we will call you to review the results.  Testing/Procedures: Your EKG today shows normal sinus rhythm which is a stable result.   Follow-Up: At [facility], you and your health needs are our priority.  As part of our continuing mission to provide you with exceptional heart care, we have created designated Provider Care Teams.  These Care Teams include your primary Cardiologist (physician) and Advanced Practice Providers (APPs -  Physician Assistants and Nurse Practitioners) who all work together to provide you with the care you need, when you need it.  We recommend signing up for the patient portal called "MyChart".  Sign up information is provided on this After Visit Summary.  MyChart is used to connect with patients for Virtual Visits (Telemedicine).  Patients are able to view lab/test results, encounter notes, upcoming appointments, etc.  Non-urgent messages can be sent to your provider as well.   To learn more about what you can do with MyChart, go to [email or web address].    Your next appointment:   4 month(s)  The format for your next appointment:   In  Person  Provider:   You may see Nelva Bush, MD or one of the following Advanced Practice Providers on your designated Care Team:    Murray Hodgkins, NP  Christell Faith, PA-C  Marrianne Mood, PA-C  Laurann Montana, NP  Other Instructions  You have been referred to cardiac rehab. This is a combination program including monitored exercise, dietary education, and support group. We strongly recommend participating in the program. Expect a phone call from them in approximately 2 weeks. If you do not hear from them, the phone number for cardiac rehab at [facility] is [phone].

## 2020-05-08 ENCOUNTER — Ambulatory Visit
Admission: RE | Admit: 2020-05-08 | Discharge: 2020-05-08 | Disposition: A | Discharge status: Home / Self Care | Payer: PPO | Source: Ambulatory Visit | Attending: Oncology | Admitting: Oncology

## 2020-05-08 ENCOUNTER — Other Ambulatory Visit: Payer: Self-pay

## 2020-05-08 DIAGNOSIS — J439 Emphysema, unspecified: Secondary | ICD-10-CM | POA: Diagnosis not present

## 2020-05-08 DIAGNOSIS — Z87891 Personal history of nicotine dependence: Secondary | ICD-10-CM | POA: Diagnosis not present

## 2020-05-08 DIAGNOSIS — Z122 Encounter for screening for malignant neoplasm of respiratory organs: Secondary | ICD-10-CM | POA: Insufficient documentation

## 2020-05-10 ENCOUNTER — Encounter: Payer: Self-pay | Admitting: *Deleted

## 2020-05-10 ENCOUNTER — Ambulatory Visit: Payer: Self-pay | Admitting: Licensed Clinical Social Worker

## 2020-05-13 DIAGNOSIS — M179 Osteoarthritis of knee, unspecified: Secondary | ICD-10-CM | POA: Diagnosis not present

## 2020-05-13 DIAGNOSIS — G894 Chronic pain syndrome: Secondary | ICD-10-CM | POA: Diagnosis not present

## 2020-05-13 DIAGNOSIS — M5136 Other intervertebral disc degeneration, lumbar region: Secondary | ICD-10-CM | POA: Diagnosis not present

## 2020-05-13 DIAGNOSIS — M545 Low back pain: Secondary | ICD-10-CM | POA: Diagnosis not present

## 2020-05-14 ENCOUNTER — Ambulatory Visit: Payer: Self-pay | Admitting: Licensed Clinical Social Worker

## 2020-05-14 NOTE — Chronic Care Management (AMB) (Signed)
Care Management   Follow Up Note   05/14/2020 Name: Ketron Mcclammy. MRN: [id number] DOB: [birth year]  Referred by: Marjie Skiff, NP Reason for referral : Care Coordination   [name] is a 64 y.o. year old male who is a primary care patient of Cannady, Dorie Rank, NP. The care management team was consulted for assistance with care management and care coordination needs.    Review of patient status, including review of consultants reports, relevant laboratory and other test results, and collaboration with appropriate care team members and the patient's provider was performed as part of comprehensive patient evaluation and provision of chronic care management services.    LCSW completed CCM outreach attempt today but was unable to reach patient successfully. A HIPPA compliant voice message was left encouraging patient to return call once available. LCSW rescheduled CCM SW appointment as well.  A HIPPA compliant phone message was left for the patient providing contact information and requesting a return call.   [name], BSW, MSW, LCSW [facility] [place]  [facility] [email or web address] Phone: [phone]

## 2020-05-14 NOTE — Chronic Care Management (AMB) (Signed)
Care Management   Follow Up Note   05/14/2020 Name: Edward Schneider. MRN: [id number] DOB: [birth year]  Referred by: Marjie Skiff, NP Reason for referral : Care Coordination   Edward Schneider. is a 64 y.o. year old male who is a primary care patient of Cannady, Dorie Rank, NP. The care management team was consulted for assistance with care management and care coordination needs.    Review of patient status, including review of consultants reports, relevant laboratory and other test results, and collaboration with appropriate care team members and the patient's provider was performed as part of comprehensive patient evaluation and provision of chronic care management services.    LCSW completed CCM outreach attempt today but was unable to reach patient successfully. A HIPPA compliant voice message was left encouraging patient to return call once available. LCSW rescheduled CCM SW appointment as well.  A HIPPA compliant phone message was left for the patient providing contact information and requesting a return call.   [name], BSW, MSW, LCSW [facility] [place]  [facility] [email or web address] Phone: [phone]

## 2020-05-15 ENCOUNTER — Telehealth: Payer: Self-pay | Admitting: Pharmacist

## 2020-05-15 NOTE — Telephone Encounter (Signed)
Patient dropped off paperwork regarding portable oxygen request from Dr. Primus Bravo. Per discussion w/ Noreene Larsson, RN CM, patient is likely going to require pulmonary referral for 6 minute walk test.   Could someone call and discuss this with him, see if he is amenable?

## 2020-05-16 ENCOUNTER — Ambulatory Visit: Payer: Self-pay | Admitting: Licensed Clinical Social Worker

## 2020-05-16 DIAGNOSIS — G894 Chronic pain syndrome: Secondary | ICD-10-CM | POA: Diagnosis not present

## 2020-05-16 NOTE — Chronic Care Management (AMB) (Signed)
Care Management   Follow Up Note   05/16/2020 Name: Woodley Landt. MRN: [id number] DOB: [birth year]  Referred by: Marjie Skiff, NP Reason for referral : Care Coordination   [name] is a 64 y.o. year old male who is a primary care patient of Cannady, Dorie Rank, NP. The care management team was consulted for assistance with care management and care coordination needs.    Review of patient status, including review of consultants reports, relevant laboratory and other test results, and collaboration with appropriate care team members and the patient's provider was performed as part of comprehensive patient evaluation and provision of chronic care management services.    LCSW completed additional unsuccessful CCM outreach attempt today on 05/16/20. A HIPPA compliant voice message was left encouraging patient to return call once available. LCSW rescheduled CCM SW appointment as well.  A HIPPA compliant phone message was left for the patient providing contact information and requesting a return call.   [name], BSW, MSW, LCSW [facility] [place]  [facility] [email or web address] Phone: [phone]

## 2020-05-16 NOTE — Telephone Encounter (Signed)
Called and discussed with patient. He wants to discuss at his appointment next week with Jolene.

## 2020-05-17 ENCOUNTER — Encounter: Payer: PPO | Attending: Internal Medicine | Admitting: *Deleted

## 2020-05-17 ENCOUNTER — Other Ambulatory Visit: Payer: Self-pay

## 2020-05-17 DIAGNOSIS — I208 Other forms of angina pectoris: Secondary | ICD-10-CM

## 2020-05-17 IMAGING — CT CT ANGIOGRAPHY CHEST
2 of 6 series · 18 of 46 positions shown · IV contrast (APPLIED)
Comparison: Chest radiograph 12/16/2017. Chest CT 09/22/2017

CLINICAL DATA: Chest pain, PE suspected, high prob. Syncope.

EXAM:
CT ANGIOGRAPHY CHEST WITH CONTRAST
TECHNIQUE: Multidetector CT imaging of the chest was performed using the
standard protocol during bolus administration of intravenous
contrast. Multiplanar CT image reconstructions and MIPs were
obtained to evaluate the vascular anatomy.
CONTRAST:  75mL OMNIPAQUE IOHEXOL 350 MG/ML SOLN

[Series 5: thins · axial · 0.77mm/px · z∈[-745,-489]mm · 15 of 282 slices shown]
[im 13/282  lung]
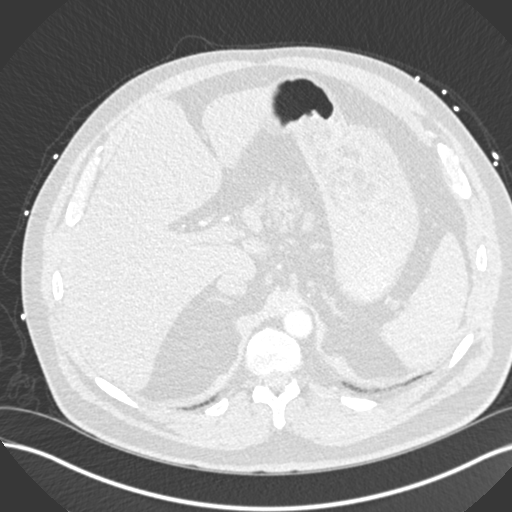
[im 37/282  soft-tissue]
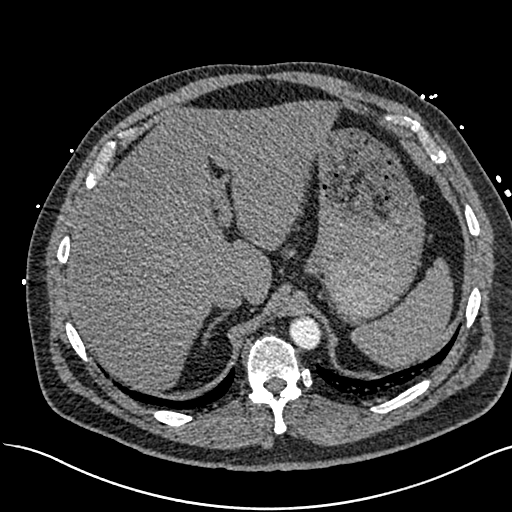
[im 49/282  lung]
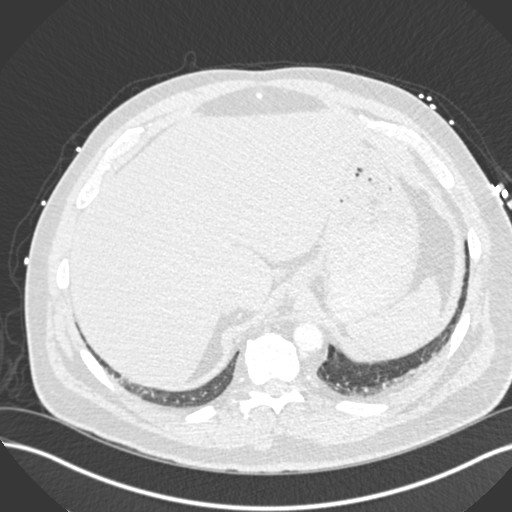
[im 74/282  soft-tissue]
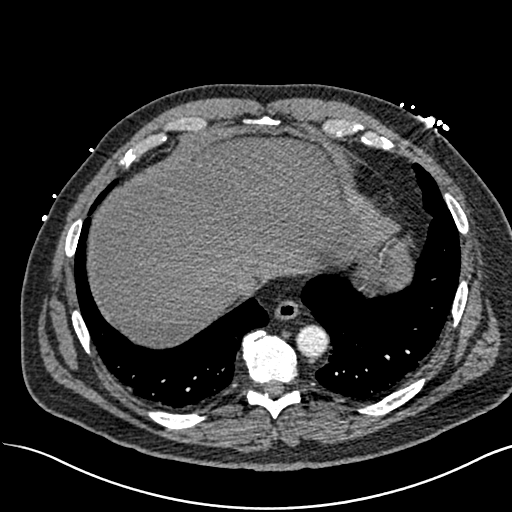
[im 86/282  lung]
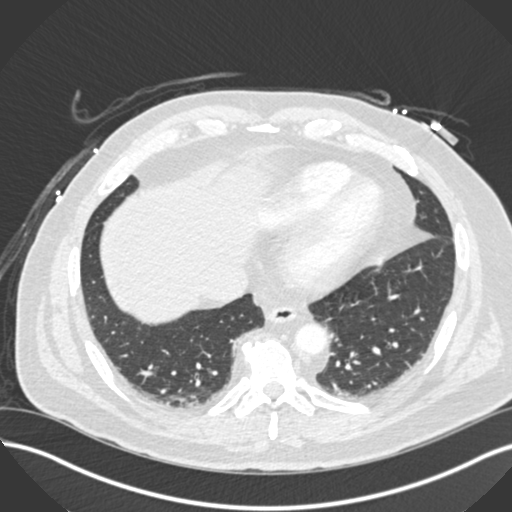
[im 110/282  soft-tissue]
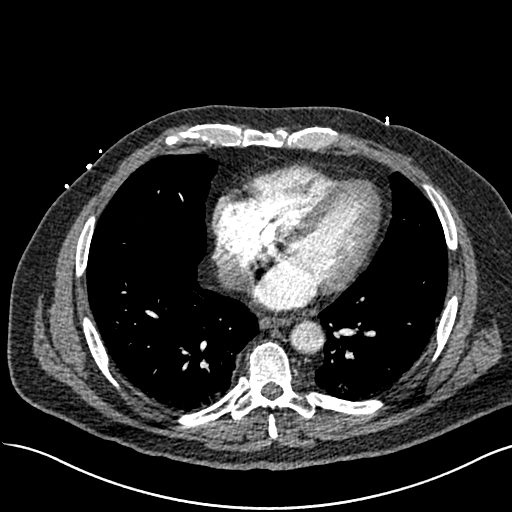
[im 123/282  lung]
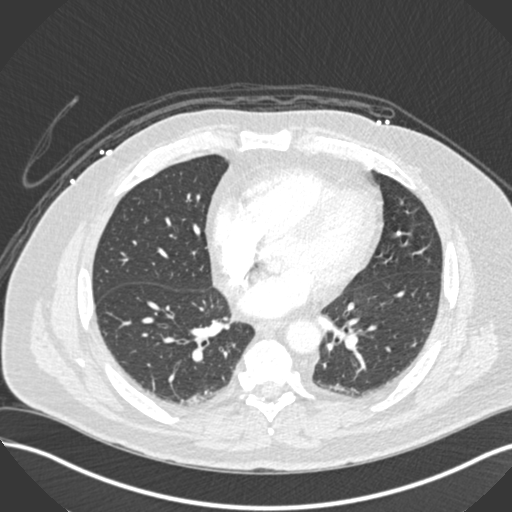
[im 147/282  soft-tissue]
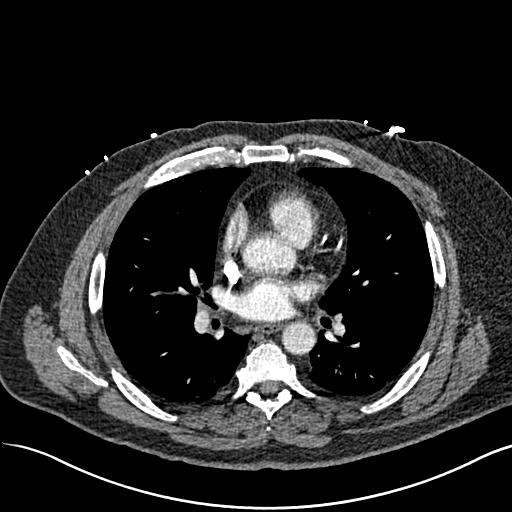
[im 159/282  lung]
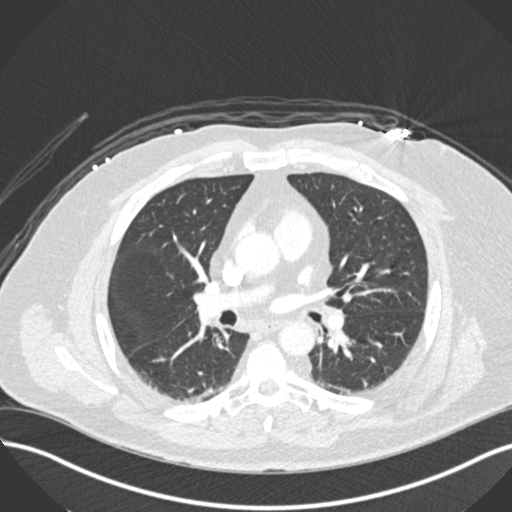
[im 172/282  soft-tissue]
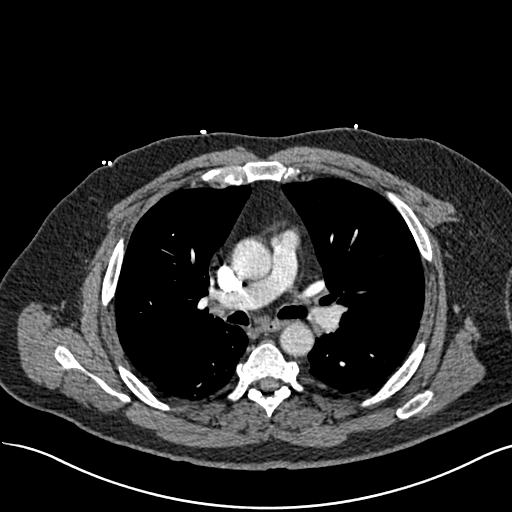
[im 196/282  lung]
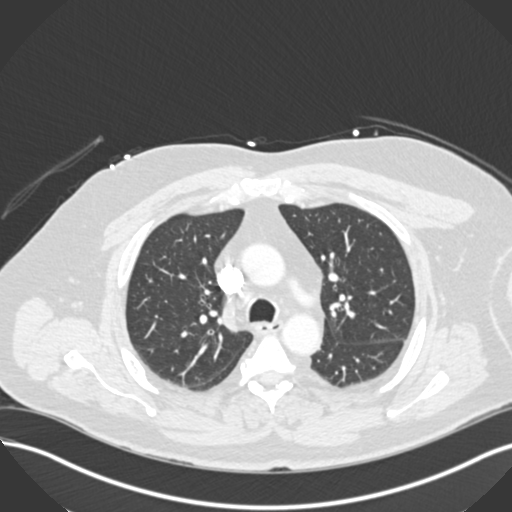
[im 208/282  soft-tissue]
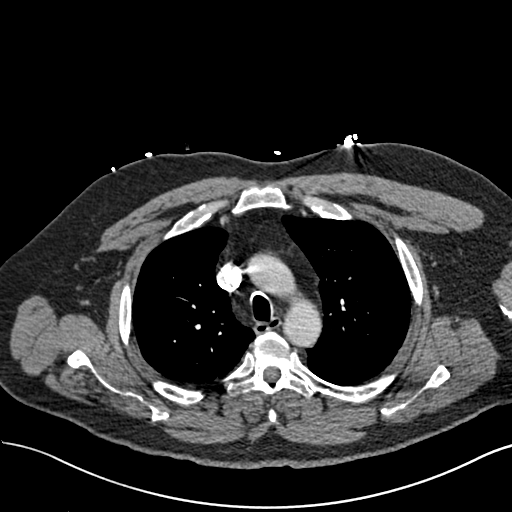
[im 233/282  lung]
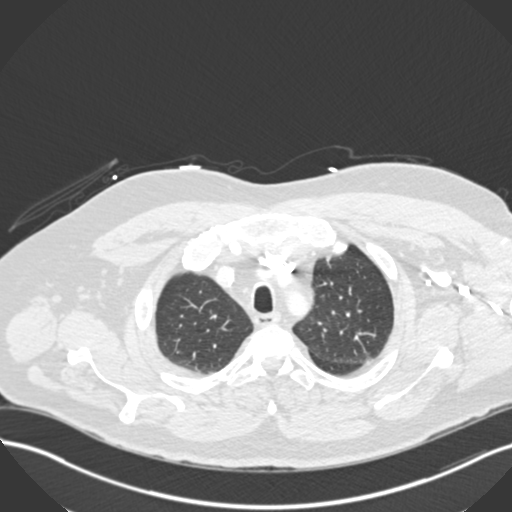
[im 245/282  soft-tissue]
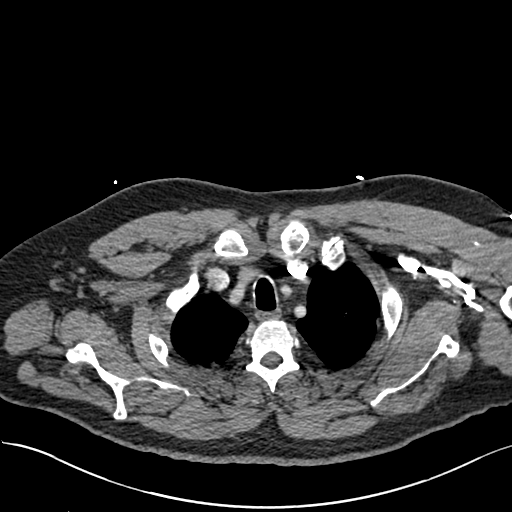
[im 269/282  lung]
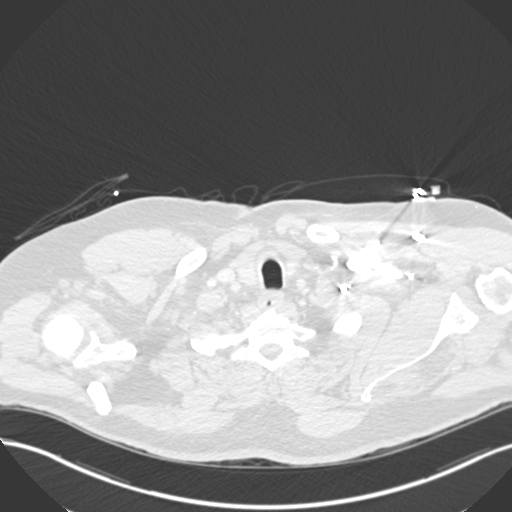

[Series 7: coronal mpr · coronal · 0.55mm/px · 3 of 94 slices shown]
[im 24/94  soft-tissue]
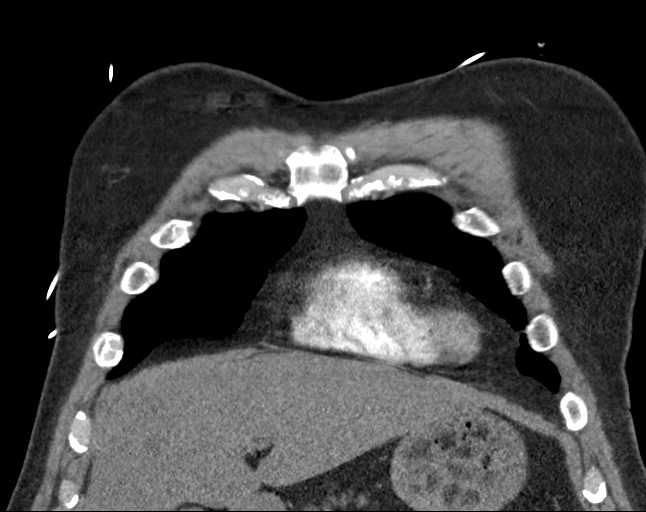
[im 47/94  soft-tissue]
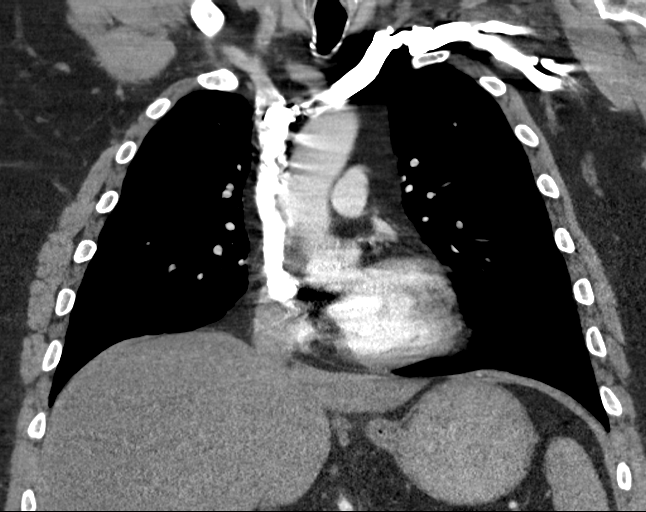
[im 70/94  soft-tissue]
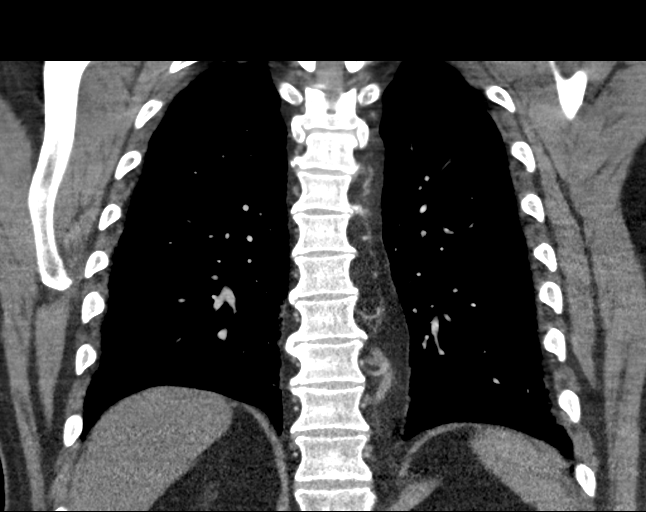

[18 of 46 positions shown; findings below may reference images not displayed]

FINDINGS: Cardiovascular: There are no filling defects within the pulmonary
arteries to the proximal segmental level to suggest pulmonary
embolus. More distal the thoracic aorta is normal in caliber without
dissection. Minimal atherosclerosis. Heart size upper normal.
Coronary artery calcifications. No pericardial effusion. Segmental
and subsegmental branches are not well assessed given phase of
contrast administration.

Mediastinum/Nodes: Small bilateral hilar nodes are not enlarged by
size criteria. No mediastinal adenopathy. The esophagus is
decompressed. No visualized thyroid nodule.

Lungs/Pleura: No focal airspace disease. No pulmonary edema or
pleural effusion. Small nodules in the right lung represents peri
fissural thickening on reformats, unchanged from prior. Previous
left lower lobe pulmonary nodule is no longer visualized. No new
pulmonary nodules.

Upper Abdomen: Prominent size liver with steatosis. Shotty
periportal nodes are likely reactive. No acute findings.

Musculoskeletal: Stable bone island in right anterior rib. Remote
anterior right rib fractures. No acute fracture or acute osseous
abnormality.

Review of the MIP images confirms the above findings.
IMPRESSION: 1. No central pulmonary embolus or acute intrathoracic abnormality.
2. Coronary artery calcifications.

## 2020-05-17 NOTE — Progress Notes (Signed)
Initial telephone orientation completed. Diagnosis can be found in 7/20. EP orientation 8/3 at 2pm

## 2020-05-20 ENCOUNTER — Other Ambulatory Visit: Payer: Self-pay

## 2020-05-20 ENCOUNTER — Encounter: Payer: Self-pay | Admitting: Podiatry

## 2020-05-20 ENCOUNTER — Ambulatory Visit: Payer: PPO | Admitting: Podiatry

## 2020-05-20 DIAGNOSIS — D689 Coagulation defect, unspecified: Secondary | ICD-10-CM

## 2020-05-20 DIAGNOSIS — E1142 Type 2 diabetes mellitus with diabetic polyneuropathy: Secondary | ICD-10-CM

## 2020-05-20 DIAGNOSIS — M79675 Pain in left toe(s): Secondary | ICD-10-CM

## 2020-05-20 DIAGNOSIS — M79674 Pain in right toe(s): Secondary | ICD-10-CM

## 2020-05-20 DIAGNOSIS — B351 Tinea unguium: Secondary | ICD-10-CM

## 2020-05-20 NOTE — Progress Notes (Signed)
This patient returns to my office for at risk foot care.  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  This patient is unable to cut nails himself since the patient cannot reach his nails.These nails are painful walking and wearing shoes.  This patient presents for at risk foot care today.  General Appearance  Alert, conversant and in no acute stress.  Vascular  Dorsalis pedis and posterior tibial  pulses are palpable  bilaterally.  Capillary return is within normal limits  bilaterally. Temperature is within normal limits  bilaterally.  Neurologic  Senn-Weinstein monofilament wire test within normal limits  bilaterally. Muscle power within normal limits bilaterally.  Nails Thick disfigured discolored nails with subungual debris  from hallux to fifth toes bilaterally. No evidence of bacterial infection or drainage bilaterally.  Orthopedic  No limitations of motion  feet .  No crepitus or effusions noted.  No bony pathology or digital deformities noted.  Skin  normotropic skin with no porokeratosis noted bilaterally.  No signs of infections or ulcers noted.     Onychomycosis  Pain in right toes  Pain in left toes  Consent was obtained for treatment procedures.   Mechanical debridement of nails 1-5  bilaterally performed with a nail nipper.  Filed with dremel without incident.    Return office visit   10 weeks                   Told patient to return for periodic foot care and evaluation due to potential at risk complications.   Gardiner Barefoot DPM

## 2020-05-21 ENCOUNTER — Encounter: Payer: PPO | Attending: Internal Medicine

## 2020-05-21 VITALS — Ht 68.5 in | Wt 250.0 lb

## 2020-05-21 DIAGNOSIS — Z794 Long term (current) use of insulin: Secondary | ICD-10-CM | POA: Diagnosis not present

## 2020-05-21 DIAGNOSIS — I10 Essential (primary) hypertension: Secondary | ICD-10-CM | POA: Insufficient documentation

## 2020-05-21 DIAGNOSIS — Z79899 Other long term (current) drug therapy: Secondary | ICD-10-CM | POA: Insufficient documentation

## 2020-05-21 DIAGNOSIS — I252 Old myocardial infarction: Secondary | ICD-10-CM | POA: Diagnosis not present

## 2020-05-21 DIAGNOSIS — I208 Other forms of angina pectoris: Secondary | ICD-10-CM | POA: Diagnosis not present

## 2020-05-21 DIAGNOSIS — M5136 Other intervertebral disc degeneration, lumbar region: Secondary | ICD-10-CM | POA: Insufficient documentation

## 2020-05-21 DIAGNOSIS — K219 Gastro-esophageal reflux disease without esophagitis: Secondary | ICD-10-CM | POA: Insufficient documentation

## 2020-05-21 DIAGNOSIS — Z7982 Long term (current) use of aspirin: Secondary | ICD-10-CM | POA: Insufficient documentation

## 2020-05-21 DIAGNOSIS — E119 Type 2 diabetes mellitus without complications: Secondary | ICD-10-CM | POA: Diagnosis not present

## 2020-05-21 DIAGNOSIS — Z7902 Long term (current) use of antithrombotics/antiplatelets: Secondary | ICD-10-CM | POA: Insufficient documentation

## 2020-05-21 DIAGNOSIS — E785 Hyperlipidemia, unspecified: Secondary | ICD-10-CM | POA: Insufficient documentation

## 2020-05-21 DIAGNOSIS — Z8673 Personal history of transient ischemic attack (TIA), and cerebral infarction without residual deficits: Secondary | ICD-10-CM | POA: Diagnosis not present

## 2020-05-21 DIAGNOSIS — M5030 Other cervical disc degeneration, unspecified cervical region: Secondary | ICD-10-CM | POA: Insufficient documentation

## 2020-05-21 DIAGNOSIS — Z87891 Personal history of nicotine dependence: Secondary | ICD-10-CM | POA: Insufficient documentation

## 2020-05-21 NOTE — Progress Notes (Signed)
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 Factors/Patient Goals at Admission:  Personal Goals and Risk Factors at Admission - 05/21/20 1518      Core Components/Risk Factors/Patient Goals on Admission    Weight Management Yes    Intervention Weight Management/Obesity: Establish reasonable short term and long term weight goals.    Admit Weight 250 lb (113.4 kg)    Goal Weight: Short Term 245 lb (111.1 kg)    Goal Weight: Long Term 240 lb (108.9 kg)    Expected Outcomes Short Term: Continue to assess and modify interventions until short term weight is achieved;Long Term: Adherence to nutrition and physical activity/exercise program aimed toward attainment of established weight goal;Understanding recommendations for meals to include 15-35% energy as protein, 25-35% energy from fat, 35-60% energy from carbohydrates, less than 251m of dietary cholesterol, 20-35 gm of total fiber daily;Weight Loss: Understanding of general  recommendations for a balanced deficit meal plan, which promotes 1-2 lb weight loss per week and includes a negative energy balance of [phone] kcal/d    Diabetes Yes    Intervention Provide education about signs/symptoms and action to take for hypo/hyperglycemia.;Provide education  about proper nutrition, including hydration, and aerobic/resistive exercise prescription along with prescribed medications to achieve blood glucose in normal ranges: Fasting glucose 65-99 mg/dL    Expected Outcomes Short Term: Participant verbalizes understanding of the signs/symptoms and immediate care of hyper/hypoglycemia, proper foot care and importance of medication, aerobic/resistive exercise and nutrition plan for blood glucose control.;Long Term: Attainment of HbA1C < 7%.    Hypertension Yes    Intervention Provide education on lifestyle modifcations including regular physical activity/exercise, weight management, moderate sodium restriction and increased consumption of fresh fruit, vegetables, and low fat dairy, alcohol moderation, and smoking cessation.;Monitor prescription use compliance.    Expected Outcomes Short Term: Continued assessment and intervention until BP is < 140/58m HG in hypertensive participants. < 130/849mHG in hypertensive participants with diabetes, heart failure or chronic kidney disease.;Long Term: Maintenance of blood pressure at goal levels.    Lipids Yes    Intervention Provide education and support for participant on nutrition & aerobic/resistive exercise along with prescribed medications to achieve LDL <7098mHDL >3m47m  Expected Outcomes Short Term: Participant states understanding of desired cholesterol values and is compliant with medications prescribed. Participant is following exercise prescription and nutrition guidelines.;Long Term: Cholesterol controlled with medications as prescribed, with individualized exercise RX and with personalized nutrition plan. Value goals: LDL < 70mg36mDL > 40 mg.           Education:Diabetes - Individual verbal and written instruction to review signs/symptoms of diabetes, desired ranges of glucose level fasting, after meals and with exercise. Acknowledge that pre and post exercise glucose checks will be done for 3 sessions at entry of program.   [facility]  Date 05/17/20  Educator [facility]  [facility]ruction Review Code 1- VerbaUnited States Steel Corporationrstanding      Education: Know Your Numbers and Risk Factors: -Group verbal and written instruction about important numbers in your health.  Discussion of what are risk factors and how they play a role in the disease process.  Review of Cholesterol, Blood Pressure, Diabetes, and BMI and the role they play in your overall health.   Core Components/Risk Factors/Patient Goals Review:    Core Components/Risk Factors/Patient Goals at Discharge (Final Review):    ITP Comments:  ITP Comments    Row Name 05/17/20 1336           ITP Comments Initial telephone orientation completed. Diagnosis can be found in 7/20. EP orientation 8/3 at 2pm              Comments: initial ITP

## 2020-05-21 NOTE — Patient Instructions (Signed)
Patient Instructions  Patient Details  Name: Edward Schneider. MRN: [id number] Date of Birth: 26-Nov-1955 Referring Provider:  Nelva Bush, MD  Below are your personal goals for exercise, nutrition, and risk factors. Our goal is to help you stay on track towards obtaining and maintaining these goals. We will be discussing your progress on these goals with you throughout the program.  Initial Exercise Prescription:  Initial Exercise Prescription - 05/21/20 1500      Date of Initial Exercise RX and Referring Provider   Date 05/21/20    Referring Provider End      Treadmill   MPH 1    Grade 0    Minutes 15    METs 1.77      NuStep   Level 1    SPM 80    Minutes 15    METs 2      Biostep-RELP   Level 2    SPM 50    Minutes 15    METs 2      Prescription Details   Frequency (times per week) 3    Duration Progress to 30 minutes of continuous aerobic without signs/symptoms of physical distress      Intensity   THRR 40-80% of Max Heartrate <110    Ratings of Perceived Exertion 11-13    Perceived Dyspnea 0-4      Resistance Training   Training Prescription Yes    Weight 3 lb    Reps 10-15           Exercise Goals: Frequency: Be able to perform aerobic exercise two to three times per week in program working toward 2-5 days per week of home exercise.  Intensity: Work with a perceived exertion of 11 (fairly light) - 15 (hard) while following your exercise prescription.  We will make changes to your prescription with you as you progress through the program.   Duration: Be able to do 30 to 45 minutes of continuous aerobic exercise in addition to a 5 minute warm-up and a 5 minute cool-down routine.   Nutrition Goals: Your personal nutrition goals will be established when you do your nutrition analysis with the dietician.  The following are general nutrition guidelines to follow: Cholesterol < 200mg /day Sodium < 1500mg /day Fiber: Men over 50 yrs - 30 grams per  day  Personal Goals:  Personal Goals and Risk Factors at Admission - 05/21/20 1518      Core Components/Risk Factors/Patient Goals on Admission    Weight Management Yes    Intervention Weight Management/Obesity: Establish reasonable short term and long term weight goals.    Admit Weight 250 lb (113.4 kg)    Goal Weight: Short Term 245 lb (111.1 kg)    Goal Weight: Long Term 240 lb (108.9 kg)    Expected Outcomes Short Term: Continue to assess and modify interventions until short term weight is achieved;Long Term: Adherence to nutrition and physical activity/exercise program aimed toward attainment of established weight goal;Understanding recommendations for meals to include 15-35% energy as protein, 25-35% energy from fat, 35-60% energy from carbohydrates, less than 200mg  of dietary cholesterol, 20-35 gm of total fiber daily;Weight Loss: Understanding of general recommendations for a balanced deficit meal plan, which promotes 1-2 lb weight loss per week and includes a negative energy balance of [phone] kcal/d    Diabetes Yes    Intervention Provide education about signs/symptoms and action to take for hypo/hyperglycemia.;Provide education about proper nutrition, including hydration, and aerobic/resistive exercise prescription along with prescribed medications  to achieve blood glucose in normal ranges: Fasting glucose 65-99 mg/dL    Expected Outcomes Short Term: Participant verbalizes understanding of the signs/symptoms and immediate care of hyper/hypoglycemia, proper foot care and importance of medication, aerobic/resistive exercise and nutrition plan for blood glucose control.;Long Term: Attainment of HbA1C < 7%.    Hypertension Yes    Intervention Provide education on lifestyle modifcations including regular physical activity/exercise, weight management, moderate sodium restriction and increased consumption of fresh fruit, vegetables, and low fat dairy, alcohol moderation, and smoking  cessation.;Monitor prescription use compliance.    Expected Outcomes Short Term: Continued assessment and intervention until BP is < 140/45mm HG in hypertensive participants. < 130/48mm HG in hypertensive participants with diabetes, heart failure or chronic kidney disease.;Long Term: Maintenance of blood pressure at goal levels.    Lipids Yes    Intervention Provide education and support for participant on nutrition & aerobic/resistive exercise along with prescribed medications to achieve LDL 70mg , HDL >40mg .    Expected Outcomes Short Term: Participant states understanding of desired cholesterol values and is compliant with medications prescribed. Participant is following exercise prescription and nutrition guidelines.;Long Term: Cholesterol controlled with medications as prescribed, with individualized exercise RX and with personalized nutrition plan. Value goals: LDL < 70mg , HDL > 40 mg.           Tobacco Use Initial Evaluation: Social History   Tobacco Use  Smoking Status Former Smoker  . Packs/day: 2.00  . Years: 50.00  . Pack years: 100.00  . Types: Cigarettes  . Quit date: 2012  . Years since quitting: 9.5  Smokeless Tobacco Never Used    Exercise Goals and Review:  Exercise Goals    Row Name 05/21/20 1520             Exercise Goals   Increase Physical Activity Yes       Intervention Provide advice, education, support and counseling about physical activity/exercise needs.;Develop an individualized exercise prescription for aerobic and resistive training based on initial evaluation findings, risk stratification, comorbidities and participant's personal goals.       Expected Outcomes Short Term: Attend rehab on a regular basis to increase amount of physical activity.;Long Term: Add in home exercise to make exercise part of routine and to increase amount of physical activity.;Long Term: Exercising regularly at least 3-5 days a week.       Increase Strength and Stamina Yes        Intervention Provide advice, education, support and counseling about physical activity/exercise needs.;Develop an individualized exercise prescription for aerobic and resistive training based on initial evaluation findings, risk stratification, comorbidities and participant's personal goals.       Expected Outcomes Short Term: Increase workloads from initial exercise prescription for resistance, speed, and METs.;Short Term: Perform resistance training exercises routinely during rehab and add in resistance training at home;Long Term: Improve cardiorespiratory fitness, muscular endurance and strength as measured by increased METs and functional capacity (6MWT)       Able to understand and use rate of perceived exertion (RPE) scale Yes       Intervention Provide education and explanation on how to use RPE scale       Expected Outcomes Short Term: Able to use RPE daily in rehab to express subjective intensity level;Long Term:  Able to use RPE to guide intensity level when exercising independently       Able to understand and use Dyspnea scale Yes       Intervention Provide education and explanation on  how to use Dyspnea scale       Expected Outcomes Short Term: Able to use Dyspnea scale daily in rehab to express subjective sense of shortness of breath during exertion;Long Term: Able to use Dyspnea scale to guide intensity level when exercising independently       Knowledge and understanding of Target Heart Rate Range (THRR) Yes       Intervention Provide education and explanation of THRR including how the numbers were predicted and where they are located for reference       Expected Outcomes Short Term: Able to state/look up THRR;Short Term: Able to use daily as guideline for intensity in rehab;Long Term: Able to use THRR to govern intensity when exercising independently       Able to check pulse independently Yes       Intervention Provide education and demonstration on how to check pulse in carotid and  radial arteries.;Review the importance of being able to check your own pulse for safety during independent exercise       Expected Outcomes Short Term: Able to explain why pulse checking is important during independent exercise;Long Term: Able to check pulse independently and accurately       Understanding of Exercise Prescription Yes       Intervention Provide education, explanation, and written materials on patient's individual exercise prescription       Expected Outcomes Short Term: Able to explain program exercise prescription;Long Term: Able to explain home exercise prescription to exercise independently              Copy of goals given to participant.

## 2020-05-22 ENCOUNTER — Ambulatory Visit: Payer: Self-pay

## 2020-05-22 NOTE — Chronic Care Management (AMB) (Signed)
Care Management   Follow Up Note   05/22/2020 Name: Blanton Proto. MRN: [id number] DOB: 23-Sep-1956  Referred by: Marjie Skiff, NP Reason for referral : Care Coordination   [name] is a 64 y.o. year old male who is a primary care patient of Cannady, Dorie Rank, NP. The care management team was consulted for assistance with care management and care coordination needs.    Review of patient status, including review of consultants reports, relevant laboratory and other test results, and collaboration with appropriate care team members and the patient's provider was performed as part of comprehensive patient evaluation and provision of chronic care management services.    LCSW completed CCM outreach attempt today but was unable to reach patient successfully. A HIPPA compliant voice message was left encouraging patient to return call once available. LCSW rescheduled CCM SW appointment as well.  A HIPPA compliant phone message was left for the patient providing contact information and requesting a return call.   [name], BSW, MSW, LCSW [facility] [place]  [facility] [email or web address] Phone: [phone]

## 2020-05-23 ENCOUNTER — Encounter: Payer: Self-pay | Admitting: Nurse Practitioner

## 2020-05-23 ENCOUNTER — Other Ambulatory Visit: Payer: Self-pay

## 2020-05-23 ENCOUNTER — Ambulatory Visit (INDEPENDENT_AMBULATORY_CARE_PROVIDER_SITE_OTHER): Payer: PPO | Admitting: Nurse Practitioner

## 2020-05-23 VITALS — BP 115/75 | HR 84 | Temp 97.6°F | Ht 68.0 in | Wt 248.8 lb

## 2020-05-23 DIAGNOSIS — F5104 Psychophysiologic insomnia: Secondary | ICD-10-CM | POA: Diagnosis not present

## 2020-05-23 DIAGNOSIS — E1169 Type 2 diabetes mellitus with other specified complication: Secondary | ICD-10-CM

## 2020-05-23 DIAGNOSIS — Z125 Encounter for screening for malignant neoplasm of prostate: Secondary | ICD-10-CM | POA: Diagnosis not present

## 2020-05-23 DIAGNOSIS — G8929 Other chronic pain: Secondary | ICD-10-CM

## 2020-05-23 DIAGNOSIS — M5441 Lumbago with sciatica, right side: Secondary | ICD-10-CM

## 2020-05-23 DIAGNOSIS — I25118 Atherosclerotic heart disease of native coronary artery with other forms of angina pectoris: Secondary | ICD-10-CM

## 2020-05-23 DIAGNOSIS — E1159 Type 2 diabetes mellitus with other circulatory complications: Secondary | ICD-10-CM | POA: Diagnosis not present

## 2020-05-23 DIAGNOSIS — E785 Hyperlipidemia, unspecified: Secondary | ICD-10-CM

## 2020-05-23 DIAGNOSIS — E1165 Type 2 diabetes mellitus with hyperglycemia: Secondary | ICD-10-CM

## 2020-05-23 DIAGNOSIS — M5442 Lumbago with sciatica, left side: Secondary | ICD-10-CM

## 2020-05-23 DIAGNOSIS — F321 Major depressive disorder, single episode, moderate: Secondary | ICD-10-CM

## 2020-05-23 DIAGNOSIS — Z1211 Encounter for screening for malignant neoplasm of colon: Secondary | ICD-10-CM

## 2020-05-23 DIAGNOSIS — I700 Atherosclerosis of aorta: Secondary | ICD-10-CM | POA: Diagnosis not present

## 2020-05-23 DIAGNOSIS — I10 Essential (primary) hypertension: Secondary | ICD-10-CM | POA: Diagnosis not present

## 2020-05-23 DIAGNOSIS — Z6837 Body mass index (BMI) 37.0-37.9, adult: Secondary | ICD-10-CM

## 2020-05-23 DIAGNOSIS — G4733 Obstructive sleep apnea (adult) (pediatric): Secondary | ICD-10-CM | POA: Diagnosis not present

## 2020-05-23 DIAGNOSIS — J432 Centrilobular emphysema: Secondary | ICD-10-CM

## 2020-05-23 DIAGNOSIS — Z0000 Encounter for general adult medical examination without abnormal findings: Secondary | ICD-10-CM | POA: Diagnosis not present

## 2020-05-23 LAB — BAYER DCA HB A1C WAIVED: HB A1C (BAYER DCA - WAIVED): 9.5 % — ABNORMAL HIGH (ref <7.0)

## 2020-05-23 LAB — MICROALBUMIN, URINE WAIVED
Creatinine, Urine Waived: 200 mg/dL (ref 10–300)
Microalb, Ur Waived: 80 mg/L — ABNORMAL HIGH (ref 0–19)
Microalb/Creat Ratio: <30 mg/g (ref <30)

## 2020-05-23 NOTE — Assessment & Plan Note (Signed)
Chronic, ongoing.  Continue Trazodone and adjust dose as needed.

## 2020-05-23 NOTE — Assessment & Plan Note (Signed)
Chronic, ongoing will continue to collaborate with endo.  Recent A1C with endo 9%.  Will obtain urine micro and A1C in office today.  Continue collaboration with endocrinology and current medication regimen.  Continue collaboration with CCM team.  Recommend continue to monitor BS QID with [name] and focus heavily on diet and modest weight loss.  Return in 3 months.

## 2020-05-23 NOTE — Assessment & Plan Note (Signed)
Chronic, continue 100% use of CPAP at home.

## 2020-05-23 NOTE — Assessment & Plan Note (Signed)
Chronic, ongoing.  Continue current medication regimen and adjust as needed. Lipid panel today. 

## 2020-05-23 NOTE — Assessment & Plan Note (Signed)
Chronic, ongoing secondary to chronic diseases and pain.  Continue Duloxetine, which benefits mood and pain, plus Trazodone for sleep.  Refuses psychiatry or therapy referral.  Will continue current regimen and adjust as needed.  If SI presents he is to immediately go to ER, which he agrees with.  Return to office in 3 months, sooner if worsening mood.

## 2020-05-23 NOTE — Assessment & Plan Note (Signed)
Chronic, stable with BP at goal.  Continue current medication regimen and adjust as needed + continue collaboration with cardiology team.  Recommend he continue to monitor BP closely at home and document + bring levels to visit.  DASH diet at home.  CMP and TSH today.  Return as scheduled in 3 months.

## 2020-05-23 NOTE — Assessment & Plan Note (Signed)
Chronic, ongoing, followed by pain management (Dr. Primus Bravo).  Continue current medication regimen as prescribed by this provider and Duloxetine for mood and pain.

## 2020-05-23 NOTE — Assessment & Plan Note (Signed)
Refer to morbid obesity plan of care. 

## 2020-05-23 NOTE — Progress Notes (Signed)
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 drinks/occasion for females) and that the risk for alcohol disorders and other health effects rises proportionally with the number of drinks per week and how often a drinker exceeds daily limits.  Discussed cessation/primary prevention of drug use and availability of treatment for abuse.   Diet: Encouraged to adjust caloric intake to maintain  or achieve ideal body weight, to reduce intake of dietary saturated fat and total fat, to limit sodium intake by avoiding high sodium foods and not adding table salt, and to maintain adequate dietary potassium and calcium preferably from fresh fruits, vegetables, and low-fat dairy products.    stressed the importance of regular exercise  Injury prevention: Discussed safety belts, safety helmets, smoke detector, smoking near bedding or upholstery.   Dental  health: Discussed importance of regular tooth brushing, flossing, and dental visits.   Follow up plan: NEXT PREVENTATIVE PHYSICAL DUE IN 1 YEAR. Return in about 3 months (around 08/23/2020) for T2DM, HTN,HLD, COPD, MOOD.

## 2020-05-23 NOTE — Assessment & Plan Note (Signed)
Followed by cardiology.  Continue current medication regimen and collaboration with cardiology.

## 2020-05-23 NOTE — Patient Instructions (Signed)

## 2020-05-23 NOTE — Assessment & Plan Note (Signed)
With T2DM and HTN.  Recommended eating smaller high protein, low fat meals more frequently and exercising 30 mins a day 5 times a week with a goal of 10-15lb weight loss in the next 3 months. Patient voiced their understanding and motivation to adhere to these recommendations.

## 2020-05-23 NOTE — Assessment & Plan Note (Signed)
Noted on CT scan in 2016, continue daily statin and ASA for prevention.  Recommend modest weight loss. 

## 2020-05-23 NOTE — Assessment & Plan Note (Signed)
Chronic, ongoing.  FEV1 52% and FEV1/FVC 96% on recent spiometry.  Continue current inhaler regimen, Anoro through assistance program received and Albuterol.  Referral to pulmonary for further testing and recommendations due to recent failure of walk test with cardiac rehab.  Recommend focus on modest weight loss.  Return in 3 months.

## 2020-05-24 ENCOUNTER — Telehealth: Payer: Self-pay

## 2020-05-24 ENCOUNTER — Encounter: Payer: PPO | Admitting: *Deleted

## 2020-05-24 ENCOUNTER — Encounter: Payer: Self-pay | Admitting: Nurse Practitioner

## 2020-05-24 DIAGNOSIS — I208 Other forms of angina pectoris: Secondary | ICD-10-CM

## 2020-05-24 LAB — COMPREHENSIVE METABOLIC PANEL
ALT: 36 IU/L (ref 0–44)
AST: 34 IU/L (ref 0–40)
Albumin/Globulin Ratio: 1.5 (ref 1.2–2.2)
Albumin: 4.4 g/dL (ref 3.8–4.8)
Alkaline Phosphatase: 86 IU/L (ref 48–121)
BUN/Creatinine Ratio: 12 (ref 10–24)
BUN: 15 mg/dL (ref 8–27)
Bilirubin Total: 0.5 mg/dL (ref 0.0–1.2)
CO2: 24 mmol/L (ref 20–29)
Calcium: 9.2 mg/dL (ref 8.6–10.2)
Chloride: 96 mmol/L (ref 96–106)
Creatinine, Ser: 1.22 mg/dL (ref 0.76–1.27)
GFR calc Af Amer: 72 mL/min/{1.73_m2} (ref >59)
GFR calc non Af Amer: 63 mL/min/{1.73_m2} (ref >59)
Globulin, Total: 2.9 g/dL (ref 1.5–4.5)
Glucose: 246 mg/dL — ABNORMAL HIGH (ref 65–99)
Potassium: 4.5 mmol/L (ref 3.5–5.2)
Sodium: 132 mmol/L — ABNORMAL LOW (ref 134–144)
Total Protein: 7.3 g/dL (ref 6.0–8.5)

## 2020-05-24 LAB — CBC WITH DIFFERENTIAL/PLATELET
Basophils Absolute: 0.1 10*3/uL (ref 0.0–0.2)
Basos: 1 %
EOS (ABSOLUTE): 0.4 10*3/uL (ref 0.0–0.4)
Eos: 5 %
Hematocrit: 38.4 % (ref 37.5–51.0)
Hemoglobin: 12.4 g/dL — ABNORMAL LOW (ref 13.0–17.7)
Immature Grans (Abs): 0 10*3/uL (ref 0.0–0.1)
Immature Granulocytes: 0 %
Lymphocytes Absolute: 2.6 10*3/uL (ref 0.7–3.1)
Lymphs: 37 %
MCH: 27.2 pg (ref 26.6–33.0)
MCHC: 32.3 g/dL (ref 31.5–35.7)
MCV: 84 fL (ref 79–97)
Monocytes Absolute: 0.6 10*3/uL (ref 0.1–0.9)
Monocytes: 8 %
Neutrophils Absolute: 3.5 10*3/uL (ref 1.4–7.0)
Neutrophils: 49 %
Platelets: 190 10*3/uL (ref 150–450)
RBC: 4.56 x10E6/uL (ref 4.14–5.80)
RDW: 13.9 % (ref 11.6–15.4)
WBC: 7.2 10*3/uL (ref 3.4–10.8)

## 2020-05-24 LAB — LIPID PANEL W/O CHOL/HDL RATIO
Cholesterol, Total: 148 mg/dL (ref 100–199)
HDL: 43 mg/dL (ref >39)
LDL Chol Calc (NIH): 66 mg/dL (ref 0–99)
Triglycerides: 239 mg/dL — ABNORMAL HIGH (ref 0–149)
VLDL Cholesterol Cal: 39 mg/dL (ref 5–40)

## 2020-05-24 LAB — TSH: TSH: 3.13 u[IU]/mL (ref 0.450–4.500)

## 2020-05-24 LAB — GLUCOSE, CAPILLARY
Glucose-Capillary: 281 mg/dL — ABNORMAL HIGH (ref 70–99)
Glucose-Capillary: 225 mg/dL — ABNORMAL HIGH (ref 70–99)

## 2020-05-24 LAB — PSA: Prostate Specific Ag, Serum: 0.4 ng/mL (ref 0.0–4.0)

## 2020-05-24 NOTE — Progress Notes (Signed)
Daily Session Note  Patient Details  Name: Edward Schneider. MRN: [id number] Date of Birth: 09-22-1956 Referring Provider:     Cardiac Rehab from 05/21/2020 in [facility]  Referring Provider End      Encounter Date: 05/24/2020  Check In:  Session Check In - 05/24/20 0824      Check-In   Supervising physician immediately available to respond to emergencies See telemetry face sheet for immediately available ER MD    Location [facility]-Cardiac & Pulmonary Rehab    Staff Present Coralie Keens, MS Exercise Physiologist;Joseph Flavia Shipper;Heath Lark, RN, BSN, CCRP    Virtual Visit No    Medication changes reported     No    Fall or balance concerns reported    No    Warm-up and Cool-down Performed on first and last piece of equipment    Resistance Training Performed Yes    VAD Patient? No    PAD/SET Patient? No      Pain Assessment   Currently in Pain? No/denies              Social History   Tobacco Use  Smoking Status Former Smoker  . Packs/day: 2.00  . Years: 50.00  . Pack years: 100.00  . Types: Cigarettes  . Quit date: 2012  . Years since quitting: 9.6  Smokeless Tobacco Never Used    Goals Met:  Exercise tolerated well Personal goals reviewed No report of cardiac concerns or symptoms  Goals Unmet:  Not Applicable  Comments: First full day of exercise!  Patient was oriented to gym and equipment including functions, settings, policies, and procedures.  Patient's individual exercise prescription and treatment plan were reviewed.  All starting workloads were established based on the results of the 6 minute walk test done at initial orientation visit.  The plan for exercise progression was also introduced and progression will be customized based on patient's performance and goals.    Dr. Emily Filbert is Medical Director for [facility].

## 2020-05-24 NOTE — Telephone Encounter (Signed)
Request for Novolog came in I filled out [employer] refill form and placed it on Dr. Quin Hoop desk to sign

## 2020-05-28 ENCOUNTER — Telehealth: Payer: Self-pay | Admitting: Nurse Practitioner

## 2020-05-28 DIAGNOSIS — G894 Chronic pain syndrome: Secondary | ICD-10-CM | POA: Diagnosis not present

## 2020-05-28 NOTE — Telephone Encounter (Signed)
Copied from [place] [id number]. Topic: General - Inquiry >> May 28, 2020  4:46 PM Edward Schneider D wrote: Reason for CRM: Pt called saying he seen Jolene on the 5th and they discussed him getting on oxygen.  He has not heard anything back about that.    Please advise [phone]

## 2020-05-29 ENCOUNTER — Emergency Department: Payer: PPO

## 2020-05-29 ENCOUNTER — Other Ambulatory Visit: Payer: Self-pay

## 2020-05-29 ENCOUNTER — Encounter: Payer: Self-pay | Admitting: *Deleted

## 2020-05-29 ENCOUNTER — Observation Stay
Admission: EM | Admit: 2020-05-29 | Discharge: 2020-05-30 | Disposition: A | Discharge status: Home / Self Care | Payer: PPO | Attending: Internal Medicine | Admitting: Internal Medicine

## 2020-05-29 ENCOUNTER — Encounter: Payer: Self-pay | Admitting: Radiology

## 2020-05-29 ENCOUNTER — Ambulatory Visit: Payer: Self-pay | Admitting: *Deleted

## 2020-05-29 ENCOUNTER — Observation Stay: Payer: PPO

## 2020-05-29 DIAGNOSIS — F329 Major depressive disorder, single episode, unspecified: Secondary | ICD-10-CM | POA: Diagnosis present

## 2020-05-29 DIAGNOSIS — I10 Essential (primary) hypertension: Secondary | ICD-10-CM | POA: Insufficient documentation

## 2020-05-29 DIAGNOSIS — I959 Hypotension, unspecified: Secondary | ICD-10-CM | POA: Diagnosis not present

## 2020-05-29 DIAGNOSIS — J449 Chronic obstructive pulmonary disease, unspecified: Secondary | ICD-10-CM | POA: Diagnosis present

## 2020-05-29 DIAGNOSIS — Z7901 Long term (current) use of anticoagulants: Secondary | ICD-10-CM | POA: Diagnosis not present

## 2020-05-29 DIAGNOSIS — Z794 Long term (current) use of insulin: Secondary | ICD-10-CM | POA: Insufficient documentation

## 2020-05-29 DIAGNOSIS — Z79899 Other long term (current) drug therapy: Secondary | ICD-10-CM | POA: Diagnosis not present

## 2020-05-29 DIAGNOSIS — Z87891 Personal history of nicotine dependence: Secondary | ICD-10-CM | POA: Insufficient documentation

## 2020-05-29 DIAGNOSIS — R1012 Left upper quadrant pain: Secondary | ICD-10-CM

## 2020-05-29 DIAGNOSIS — I639 Cerebral infarction, unspecified: Secondary | ICD-10-CM | POA: Diagnosis not present

## 2020-05-29 DIAGNOSIS — Z23 Encounter for immunization: Secondary | ICD-10-CM | POA: Diagnosis not present

## 2020-05-29 DIAGNOSIS — E1169 Type 2 diabetes mellitus with other specified complication: Secondary | ICD-10-CM | POA: Diagnosis not present

## 2020-05-29 DIAGNOSIS — E785 Hyperlipidemia, unspecified: Secondary | ICD-10-CM | POA: Diagnosis not present

## 2020-05-29 DIAGNOSIS — R160 Hepatomegaly, not elsewhere classified: Secondary | ICD-10-CM | POA: Diagnosis not present

## 2020-05-29 DIAGNOSIS — I2510 Atherosclerotic heart disease of native coronary artery without angina pectoris: Secondary | ICD-10-CM

## 2020-05-29 DIAGNOSIS — J9811 Atelectasis: Secondary | ICD-10-CM | POA: Diagnosis not present

## 2020-05-29 DIAGNOSIS — E1140 Type 2 diabetes mellitus with diabetic neuropathy, unspecified: Secondary | ICD-10-CM | POA: Diagnosis not present

## 2020-05-29 DIAGNOSIS — E1159 Type 2 diabetes mellitus with other circulatory complications: Secondary | ICD-10-CM | POA: Diagnosis not present

## 2020-05-29 DIAGNOSIS — R7989 Other specified abnormal findings of blood chemistry: Secondary | ICD-10-CM | POA: Diagnosis not present

## 2020-05-29 DIAGNOSIS — Z20822 Contact with and (suspected) exposure to covid-19: Secondary | ICD-10-CM | POA: Diagnosis not present

## 2020-05-29 DIAGNOSIS — J439 Emphysema, unspecified: Secondary | ICD-10-CM | POA: Diagnosis present

## 2020-05-29 DIAGNOSIS — R0602 Shortness of breath: Secondary | ICD-10-CM | POA: Insufficient documentation

## 2020-05-29 DIAGNOSIS — R0789 Other chest pain: Principal | ICD-10-CM

## 2020-05-29 DIAGNOSIS — R0902 Hypoxemia: Secondary | ICD-10-CM | POA: Diagnosis not present

## 2020-05-29 DIAGNOSIS — R079 Chest pain, unspecified: Secondary | ICD-10-CM | POA: Diagnosis present

## 2020-05-29 DIAGNOSIS — M79604 Pain in right leg: Secondary | ICD-10-CM | POA: Diagnosis not present

## 2020-05-29 DIAGNOSIS — E1142 Type 2 diabetes mellitus with diabetic polyneuropathy: Secondary | ICD-10-CM | POA: Diagnosis present

## 2020-05-29 DIAGNOSIS — Z7982 Long term (current) use of aspirin: Secondary | ICD-10-CM | POA: Diagnosis not present

## 2020-05-29 DIAGNOSIS — M79605 Pain in left leg: Secondary | ICD-10-CM | POA: Diagnosis not present

## 2020-05-29 DIAGNOSIS — I208 Other forms of angina pectoris: Secondary | ICD-10-CM

## 2020-05-29 DIAGNOSIS — Z8505 Personal history of malignant neoplasm of liver: Secondary | ICD-10-CM | POA: Insufficient documentation

## 2020-05-29 DIAGNOSIS — K219 Gastro-esophageal reflux disease without esophagitis: Secondary | ICD-10-CM | POA: Diagnosis not present

## 2020-05-29 DIAGNOSIS — I700 Atherosclerosis of aorta: Secondary | ICD-10-CM | POA: Diagnosis not present

## 2020-05-29 DIAGNOSIS — F32A Depression, unspecified: Secondary | ICD-10-CM | POA: Diagnosis present

## 2020-05-29 LAB — HEPATIC FUNCTION PANEL
ALT: 34 U/L (ref 0–44)
AST: 41 U/L (ref 15–41)
Albumin: 4 g/dL (ref 3.5–5.0)
Alkaline Phosphatase: 69 U/L (ref 38–126)
Bilirubin, Direct: 0.2 mg/dL (ref 0.0–0.2)
Indirect Bilirubin: 0.8 mg/dL (ref 0.3–0.9)
Total Bilirubin: 1 mg/dL (ref 0.3–1.2)
Total Protein: 7.2 g/dL (ref 6.5–8.1)

## 2020-05-29 LAB — URINE DRUG SCREEN, QUALITATIVE (ARMC ONLY)
Amphetamines, Ur Screen: NOT DETECTED
Barbiturates, Ur Screen: NOT DETECTED
Benzodiazepine, Ur Scrn: NOT DETECTED
Cannabinoid 50 Ng, Ur ~~LOC~~: NOT DETECTED
Cocaine Metabolite,Ur ~~LOC~~: NOT DETECTED
MDMA (Ecstasy)Ur Screen: NOT DETECTED
Methadone Scn, Ur: NOT DETECTED
Opiate, Ur Screen: POSITIVE — AB
Phencyclidine (PCP) Ur S: NOT DETECTED
Tricyclic, Ur Screen: POSITIVE — AB

## 2020-05-29 LAB — CBC
HCT: 33.3 % — ABNORMAL LOW (ref 39.0–52.0)
Hemoglobin: 11.4 g/dL — ABNORMAL LOW (ref 13.0–17.0)
MCH: 27.7 pg (ref 26.0–34.0)
MCHC: 34.2 g/dL (ref 30.0–36.0)
MCV: 81 fL (ref 80.0–100.0)
Platelets: 192 10*3/uL (ref 150–400)
RBC: 4.11 MIL/uL — ABNORMAL LOW (ref 4.22–5.81)
RDW: 14.4 % (ref 11.5–15.5)
WBC: 8.6 10*3/uL (ref 4.0–10.5)
nRBC: 0 % (ref 0.0–0.2)

## 2020-05-29 LAB — TROPONIN I (HIGH SENSITIVITY)
Troponin I (High Sensitivity): 4 ng/L (ref <18)
Troponin I (High Sensitivity): 5 ng/L (ref <18)
Troponin I (High Sensitivity): 6 ng/L (ref <18)
Troponin I (High Sensitivity): 5 ng/L (ref <18)

## 2020-05-29 LAB — BASIC METABOLIC PANEL
Anion gap: 11 (ref 5–15)
BUN: 21 mg/dL (ref 8–23)
CO2: 22 mmol/L (ref 22–32)
Calcium: 9.4 mg/dL (ref 8.9–10.3)
Chloride: 98 mmol/L (ref 98–111)
Creatinine, Ser: 1.11 mg/dL (ref 0.61–1.24)
GFR calc Af Amer: >60 mL/min (ref >60)
GFR calc non Af Amer: >60 mL/min (ref >60)
Glucose, Bld: 348 mg/dL — ABNORMAL HIGH (ref 70–99)
Potassium: 4.4 mmol/L (ref 3.5–5.1)
Sodium: 131 mmol/L — ABNORMAL LOW (ref 135–145)

## 2020-05-29 LAB — FIBRIN DERIVATIVES D-DIMER (ARMC ONLY): Fibrin derivatives D-dimer (ARMC): 687.33 ng/mL (FEU) — ABNORMAL HIGH (ref 0.00–499.00)

## 2020-05-29 LAB — BRAIN NATRIURETIC PEPTIDE: B Natriuretic Peptide: 25.3 pg/mL (ref 0.0–100.0)

## 2020-05-29 LAB — GLUCOSE, CAPILLARY
Glucose-Capillary: 211 mg/dL — ABNORMAL HIGH (ref 70–99)
Glucose-Capillary: 223 mg/dL — ABNORMAL HIGH (ref 70–99)

## 2020-05-29 LAB — LIPASE, BLOOD: Lipase: 40 U/L (ref 11–51)

## 2020-05-29 LAB — SARS CORONAVIRUS 2 BY RT PCR (HOSPITAL ORDER, PERFORMED IN ~~LOC~~ HOSPITAL LAB): SARS Coronavirus 2: NEGATIVE

## 2020-05-29 MED ORDER — ACETAMINOPHEN 325 MG PO TABS: 650.0000 mg | ORAL_TABLET | Freq: Four times a day (QID) | ORAL | Status: DC | PRN

## 2020-05-29 MED ORDER — PANTOPRAZOLE SODIUM 40 MG PO TBEC
40.0000 mg | DELAYED_RELEASE_TABLET | Freq: Two times a day (BID) | ORAL | Status: DC
  Administered 2020-05-29 – 2020-05-30 (×2): 40 mg via ORAL
  Filled 2020-05-29 (×2): qty 1

## 2020-05-29 MED ORDER — DULOXETINE HCL 30 MG PO CPEP
30.0000 mg | ORAL_CAPSULE | Freq: Two times a day (BID) | ORAL | Status: DC
  Administered 2020-05-30: 30 mg via ORAL
  Filled 2020-05-29: qty 1

## 2020-05-29 MED ORDER — KETOROLAC TROMETHAMINE 30 MG/ML IJ SOLN
15.0000 mg | Freq: Once | INTRAMUSCULAR | Status: AC
  Administered 2020-05-29: 15 mg via INTRAVENOUS
  Filled 2020-05-29: qty 1

## 2020-05-29 MED ORDER — HYDROMORPHONE HCL 1 MG/ML IJ SOLN
1.0000 mg | Freq: Once | INTRAMUSCULAR | Status: AC
  Administered 2020-05-29: 1 mg via INTRAVENOUS
  Filled 2020-05-29: qty 1

## 2020-05-29 MED ORDER — NITROGLYCERIN 0.4 MG SL SUBL: 0.4000 mg | SUBLINGUAL_TABLET | SUBLINGUAL | Status: DC | PRN

## 2020-05-29 MED ORDER — GINSENG 100 MG PO CAPS: 1.0000 | ORAL_CAPSULE | Freq: Every day | ORAL | Status: DC

## 2020-05-29 MED ORDER — DOXEPIN HCL 50 MG PO CAPS
50.0000 mg | ORAL_CAPSULE | Freq: Every day | ORAL | Status: DC
  Administered 2020-05-29: 50 mg via ORAL
  Filled 2020-05-29 (×2): qty 1

## 2020-05-29 MED ORDER — ONDANSETRON HCL 4 MG/2ML IJ SOLN: 4.0000 mg | Freq: Three times a day (TID) | INTRAMUSCULAR | Status: DC | PRN

## 2020-05-29 MED ORDER — DOXEPIN HCL 25 MG PO CAPS
25.0000 mg | ORAL_CAPSULE | Freq: Two times a day (BID) | ORAL | Status: DC
  Filled 2020-05-29: qty 1

## 2020-05-29 MED ORDER — SUCRALFATE 1 G PO TABS
1.0000 g | ORAL_TABLET | Freq: Three times a day (TID) | ORAL | Status: DC
  Administered 2020-05-29 – 2020-05-30 (×2): 1 g via ORAL
  Filled 2020-05-29 (×2): qty 1

## 2020-05-29 MED ORDER — VITAMIN B-12 1000 MCG PO TABS
1000.0000 ug | ORAL_TABLET | Freq: Every day | ORAL | Status: DC
  Administered 2020-05-30: 1000 ug via ORAL
  Filled 2020-05-29: qty 1

## 2020-05-29 MED ORDER — ENOXAPARIN SODIUM 40 MG/0.4ML ~~LOC~~ SOLN
40.0000 mg | SUBCUTANEOUS | Status: DC
  Administered 2020-05-29: 40 mg via SUBCUTANEOUS
  Filled 2020-05-29: qty 0.4

## 2020-05-29 MED ORDER — HYDROMORPHONE HCL 1 MG/ML IJ SOLN
0.5000 mg | INTRAMUSCULAR | Status: DC | PRN
  Administered 2020-05-29: 0.5 mg via INTRAVENOUS
  Filled 2020-05-29: qty 1

## 2020-05-29 MED ORDER — CALCIUM CARBONATE ANTACID 500 MG PO CHEW
500.0000 mg | CHEWABLE_TABLET | Freq: Every day | ORAL | Status: DC
  Filled 2020-05-29: qty 3

## 2020-05-29 MED ORDER — OMEGA-3-ACID ETHYL ESTERS 1 G PO CAPS
1000.0000 mg | ORAL_CAPSULE | Freq: Every day | ORAL | Status: DC
  Administered 2020-05-30: 1000 mg via ORAL
  Filled 2020-05-29: qty 1

## 2020-05-29 MED ORDER — GINGER 500 MG PO CAPS: 1.0000 | ORAL_CAPSULE | Freq: Every day | ORAL | Status: DC

## 2020-05-29 MED ORDER — CLOPIDOGREL BISULFATE 75 MG PO TABS
75.0000 mg | ORAL_TABLET | Freq: Every day | ORAL | Status: DC
  Administered 2020-05-30: 75 mg via ORAL
  Filled 2020-05-29: qty 1

## 2020-05-29 MED ORDER — ARFORMOTEROL TARTRATE 15 MCG/2ML IN NEBU
15.0000 ug | INHALATION_SOLUTION | Freq: Every day | RESPIRATORY_TRACT | Status: DC
  Administered 2020-05-30: 15 ug via RESPIRATORY_TRACT
  Filled 2020-05-29 (×2): qty 2

## 2020-05-29 MED ORDER — DULOXETINE HCL 30 MG PO CPEP
30.0000 mg | ORAL_CAPSULE | Freq: Every day | ORAL | Status: DC
  Filled 2020-05-29: qty 1

## 2020-05-29 MED ORDER — ASCORBIC ACID 500 MG PO TABS
1000.0000 mg | ORAL_TABLET | Freq: Every day | ORAL | Status: DC
  Administered 2020-05-30: 1000 mg via ORAL
  Filled 2020-05-29: qty 2

## 2020-05-29 MED ORDER — IOHEXOL 350 MG/ML SOLN
100.0000 mL | Freq: Once | INTRAVENOUS | Status: AC | PRN
  Administered 2020-05-29: 100 mL via INTRAVENOUS

## 2020-05-29 MED ORDER — INSULIN ASPART 100 UNIT/ML ~~LOC~~ SOLN
0.0000 [IU] | Freq: Every day | SUBCUTANEOUS | Status: DC
  Administered 2020-05-29: 2 [IU] via SUBCUTANEOUS
  Filled 2020-05-29: qty 1

## 2020-05-29 MED ORDER — CEPHALEXIN 500 MG PO CAPS
500.0000 mg | ORAL_CAPSULE | Freq: Two times a day (BID) | ORAL | Status: DC
  Administered 2020-05-29 – 2020-05-30 (×2): 500 mg via ORAL
  Filled 2020-05-29 (×2): qty 1

## 2020-05-29 MED ORDER — BENAZEPRIL HCL 10 MG PO TABS
10.0000 mg | ORAL_TABLET | Freq: Every day | ORAL | Status: DC
  Administered 2020-05-29 – 2020-05-30 (×2): 10 mg via ORAL
  Filled 2020-05-29 (×2): qty 1

## 2020-05-29 MED ORDER — ASPIRIN EC 81 MG PO TBEC
81.0000 mg | DELAYED_RELEASE_TABLET | Freq: Every day | ORAL | Status: DC
  Administered 2020-05-30: 81 mg via ORAL
  Filled 2020-05-29: qty 1

## 2020-05-29 MED ORDER — VITAMIN A 3 MG (10000 UNIT) PO CAPS
10000.0000 [IU] | ORAL_CAPSULE | Freq: Every day | ORAL | Status: DC
  Administered 2020-05-29 – 2020-05-30 (×2): 10000 [IU] via ORAL
  Filled 2020-05-29 (×2): qty 1

## 2020-05-29 MED ORDER — ATORVASTATIN CALCIUM 80 MG PO TABS
80.0000 mg | ORAL_TABLET | Freq: Every day | ORAL | Status: DC
  Administered 2020-05-30: 80 mg via ORAL
  Filled 2020-05-29: qty 1

## 2020-05-29 MED ORDER — DULOXETINE HCL 30 MG PO CPEP: 60.0000 mg | ORAL_CAPSULE | Freq: Every day | ORAL | Status: DC

## 2020-05-29 MED ORDER — INSULIN ASPART 100 UNIT/ML ~~LOC~~ SOLN
0.0000 [IU] | Freq: Three times a day (TID) | SUBCUTANEOUS | Status: DC
  Administered 2020-05-29 – 2020-05-30 (×2): 3 [IU] via SUBCUTANEOUS
  Filled 2020-05-29 (×2): qty 1

## 2020-05-29 MED ORDER — OXYCODONE HCL 5 MG PO TABS
7.5000 mg | ORAL_TABLET | ORAL | Status: DC | PRN
  Administered 2020-05-29 – 2020-05-30 (×4): 15 mg via ORAL
  Filled 2020-05-29 (×2): qty 3
  Filled 2020-05-29: qty 2
  Filled 2020-05-29 (×2): qty 3

## 2020-05-29 MED ORDER — UMECLIDINIUM BROMIDE 62.5 MCG/INH IN AEPB
1.0000 | INHALATION_SPRAY | Freq: Every day | RESPIRATORY_TRACT | Status: DC
  Administered 2020-05-30: 09:00:00 1 via RESPIRATORY_TRACT
  Filled 2020-05-29: qty 7

## 2020-05-29 MED ORDER — ALBUTEROL SULFATE (2.5 MG/3ML) 0.083% IN NEBU: 2.5000 mg | INHALATION_SOLUTION | RESPIRATORY_TRACT | Status: DC | PRN

## 2020-05-29 MED ORDER — VITAMIN E 180 MG (400 UNIT) PO CAPS
400.0000 [IU] | ORAL_CAPSULE | Freq: Every day | ORAL | Status: DC
  Administered 2020-05-29 – 2020-05-30 (×2): 400 [IU] via ORAL
  Filled 2020-05-29 (×2): qty 1

## 2020-05-29 MED ORDER — TRAZODONE HCL 100 MG PO TABS
100.0000 mg | ORAL_TABLET | Freq: Every evening | ORAL | Status: DC | PRN
  Administered 2020-05-29: 100 mg via ORAL
  Filled 2020-05-29: qty 1

## 2020-05-29 MED ORDER — CINNAMON 500 MG PO CAPS: 1000.0000 mg | ORAL_CAPSULE | Freq: Two times a day (BID) | ORAL | Status: DC

## 2020-05-29 MED ORDER — SODIUM CHLORIDE 0.9 % IV BOLUS
500.0000 mL | Freq: Once | INTRAVENOUS | Status: AC
  Administered 2020-05-29: 500 mL via INTRAVENOUS

## 2020-05-29 MED ORDER — VITAMIN D 25 MCG (1000 UNIT) PO TABS
1000.0000 [IU] | ORAL_TABLET | Freq: Every day | ORAL | Status: DC
  Administered 2020-05-30: 1000 [IU] via ORAL
  Filled 2020-05-29: qty 1

## 2020-05-29 MED ORDER — INSULIN GLARGINE 100 UNIT/ML ~~LOC~~ SOLN
20.0000 [IU] | Freq: Every day | SUBCUTANEOUS | Status: DC
  Administered 2020-05-29 – 2020-05-30 (×2): 20 [IU] via SUBCUTANEOUS
  Filled 2020-05-29 (×2): qty 0.2

## 2020-05-29 MED ORDER — PNEUMOCOCCAL VAC POLYVALENT 25 MCG/0.5ML IJ INJ
0.5000 mL | INJECTION | INTRAMUSCULAR | Status: AC
  Administered 2020-05-30: 0.5 mL via INTRAMUSCULAR
  Filled 2020-05-29: qty 0.5

## 2020-05-29 MED ORDER — PREGABALIN 50 MG PO CAPS
50.0000 mg | ORAL_CAPSULE | Freq: Three times a day (TID) | ORAL | Status: DC
  Administered 2020-05-29 – 2020-05-30 (×2): 50 mg via ORAL
  Filled 2020-05-29 (×2): qty 1

## 2020-05-29 MED ORDER — HYDRALAZINE HCL 20 MG/ML IJ SOLN: 5.0000 mg | INTRAMUSCULAR | Status: DC | PRN

## 2020-05-29 MED ORDER — HAIR SKIN AND NAILS FORMULA PO TABS: 1.0000 | ORAL_TABLET | Freq: Every day | ORAL | Status: DC

## 2020-05-29 NOTE — Telephone Encounter (Signed)
Noted, agree with ER due to chest pain and history.  I will add a member of his cardiology team here as well, so they are aware.  Thank you.

## 2020-05-29 NOTE — Telephone Encounter (Signed)
Referral entered to pulmonary last week at patients appointment. Velora Heckler has already tried to contact him according to referral notes so patient just needs to call back to schedule. Patient is currently at the ER, will keep watch on chart and call patient regarding this when he is discharged.
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
## 2020-05-29 NOTE — ED Notes (Signed)
Called floor for report, per unit they are assigning RN to room at this time and will call back when someone is assigned to take pt  Left name and number Olen Cordial, [phone]

## 2020-05-29 NOTE — ED Notes (Signed)
Called floor to give report, per unit receiving RN is reviewing chart and will call back with questions. Left name/number  Olen Cordial, [place]

## 2020-05-29 NOTE — Progress Notes (Signed)
Ch visited with Pt in response to OR for AD. Pt was looking around room for TV remote when Ch entered room. Ch went to the Nurses' station and found a TV remote through [employer] to bring back to room. Pt was pleased. Ch gave Pt AD education. Pt wants to assign wife as HPOA. Morning chaplains will help complete AD. Pt shared at length about his bodily and emotional concerns. Pt reported to Ch that there are spots in his lungs, and almost all of his organs are affected, mainly his heart. Pt says he is not ready to leave earth yet, "I want some more time with my newly born great-granddaughter". Pt emotional sharing about the loss of two of his daughters, and grand-daughter to cancer. Ch prayed with Pt. Pt is supported well by family and church. Ch will pass AD completion to other chaplains.

## 2020-05-29 NOTE — ED Notes (Addendum)
Cardiology to bedside. 
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
## 2020-05-29 NOTE — Progress Notes (Signed)
PHARMACIST - PHYSICIAN ORDER COMMUNICATION  CONCERNING: P&T Medication Policy on Herbal Medications  DESCRIPTION:  This patient's order for:  Ginseng Caps,  Cinnamon tabs,  Ginger Caps,  Hair Skin and Nails formula tabs has been noted.  This product(s) is classified as an "herbal" or natural product. Due to a lack of definitive safety studies or FDA approval, nonstandard manufacturing practices, plus the potential risk of unknown drug-drug interactions while on inpatient medications, the Pharmacy and Therapeutics Committee does not permit the use of "herbal" or natural products of this type within [facility].   ACTION TAKEN: The pharmacy department is unable to verify this order at this time and your patient has been informed of this safety policy. Please reevaluate patient's clinical condition at discharge and address if the herbal or natural product(s) should be resumed at that time.

## 2020-05-29 NOTE — ED Notes (Signed)
Pt given a tv remote and ginger ale. Pt's IV dressing changed.

## 2020-05-29 NOTE — ED Triage Notes (Signed)
Pt arrived via [facility] with CP x5 days. Pt states when he takes a deep breath, pain is reproducible. Hx of MI Nov 2020 with stent placement. 98/62, 98, 96% on RA.

## 2020-05-29 NOTE — Telephone Encounter (Signed)
Pt called in c/o having "real bad" chest pain in left side of his chest, left shoulder and all the way down his left arm".  He is in cardiac rehab for a recent MI. I immediately told him to call 911 now that he needed to get back to the hospital ASAP with his history and chest pain.  He said, "ok".  I sent my notes to Marnee Guarneri, NP at [facility] so she would be aware.  I called him back at 7:28 AM to make sure he called 911.   His wife answered and said , "No he hasn't".     "We were trying to get the car started but the battery must be dead".    She put him on the line and I instructed him to call 911 now.   They did not need to go by car even if it would start.   He needs treatment started in the ambulance on the way.   This is very important you call 911 now.   Wife was agreeable.  "We'll call".   He sighed and didn't say anything but his wife said,  "we'll call".     I emphasized again the importance of calling now.     Reason for Disposition . [1] Chest pain lasts > 5 minutes AND [2] history of heart disease (i.e., angina, heart attack, heart failure, bypass surgery, takes nitroglycerin)  Answer Assessment - Initial Assessment Questions 1. LOCATION: "Where does it hurt?"       Left side of chest.   A whole lot of pain.   Pain into my left shoulder and all the way down into my left arm real bad.   2. RADIATION: "Does the pain go anywhere else?" (e.g., into neck, jaw, arms, back)     Left shoulder and all the way down my left arm real bad 3. ONSET: "When did the chest pain begin?" (Minutes, hours or days)      He is in cardiac rehab post heart attack.   Cardiac rehab told him he needed to contact his doctor.   After his description of the chest pain and being post MI I told him to call 911 now and get back to the hospital.   Don't wait call now.  He said, "ok".   I emphasized the importance of going now via EMS. 4. PATTERN "Does the pain come and go, or has it been  constant since it started?"  "Does it get worse with exertion?"      Constant and "real bad" 5. DURATION: "How long does it last" (e.g., seconds, minutes, hours)     *No Answer* 6. SEVERITY: "How bad is the pain?"  (e.g., Scale 1-10; mild, moderate, or severe)    - MILD (1-3): doesn't interfere with normal activities     - MODERATE (4-7): interferes with normal activities or awakens from sleep    - SEVERE (8-10): excruciating pain, unable to do any normal activities       Severe "real bad" in left chest, left shoulder and down left arm. 7. CARDIAC RISK FACTORS: "Do you have any history of heart problems or risk factors for heart disease?" (e.g., angina, prior heart attack; diabetes, high blood pressure, high cholesterol, smoker, or strong family history of heart disease)     Recent heart attack.   In cardiac rehab. 8. PULMONARY RISK FACTORS: "Do you have any history of lung disease?"  (e.g., blood clots in lung, asthma, emphysema,  birth control pills)     Not asked.   Instructed to call 911 immediately 9. CAUSE: "What do you think is causing the chest pain?"     Not asked see above 10. OTHER SYMPTOMS: "Do you have any other symptoms?" (e.g., dizziness, nausea, vomiting, sweating, fever, difficulty breathing, cough)       See above 11. PREGNANCY: "Is there any chance you are pregnant?" "When was your last menstrual period?"       N/A  Protocols used: CHEST PAIN-A-AH

## 2020-05-29 NOTE — Progress Notes (Signed)
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 Review of Valvular disease and Heart Failure   Cardiac Procedures: - Group verbal and written instruction to review commonly prescribed medications for heart disease. Reviews the medication, class of the drug, and side effects. Includes the  steps to properly store meds and maintain the prescription regimen. (beta blockers and nitrates)   Cardiac Medications I: - Group verbal and written instruction to review commonly prescribed medications for heart disease. Reviews the medication, class of the drug, and side effects. Includes the steps to properly store meds and maintain the prescription regimen.   Cardiac Medications II: -Group verbal and written instruction to review commonly prescribed medications for heart disease. Reviews the medication, class of the drug, and side effects. (all other drug classes)    Go Sex-Intimacy & Heart Disease, Get  SMART - Goal Setting: - Group verbal and written instruction through game format to discuss heart disease and the return to sexual intimacy. Provides group verbal and written material to discuss and apply goal setting through the application of the S.M.A.R.T. Method.   Other Matters of the Heart: - Provides group verbal, written materials and models to describe Stable Angina and Peripheral Artery. Includes description of the disease process and treatment options available to the cardiac patient.   Infection Prevention: - Provides verbal and written material to individual with discussion of infection control including proper hand washing and proper equipment cleaning during exercise session.   Cardiac Rehab from 05/24/2020 in [facility] Cardiac and Pul[facility] Re[facility]  Date 05/21/20  Educator AS  Instruction Review Code 1- Verbalizes Understanding      Falls Prevention: - Provides verbal and written material to individual with discussion of falls prevention and safety.   Cardiac Rehab from 05/24/2020 in [facility] Cardiac and Pulmonary Rehab  Date 05/21/20  Educator AS  Instruction Review Code 1- Verbalizes Understanding      Other: -Provides group and verbal instruction on various topics (see comments)   Knowledge Questionnaire Score:  Knowledge Questionnaire Score - 05/21/20 1517      Knowledge Questionnaire Score   Pre Score 22/26 nutirtion/angina           Core Components/Risk Factors/Patient Goals at Admission:  Personal Goals and Risk Factors at Admission - 05/21/20 1518      Core Components/Risk Factors/Patient Goals on Admission    Weight Management Yes    Intervention Weight Management/Obesity: Establish reasonable short term and long term weight goals.    Admit Weight 250 lb (113.4 kg)    Goal Weight: Short Term 245 lb (111.1 kg)    Goal Weight: Long Term 240 lb (108.9 kg)    Expected Outcomes Short Term: Continue to assess and modify interventions until short term weight is  achieved;Long Term: Adherence to nutrition and physical activity/exercise program aimed toward attainment of established weight goal;Understanding recommendations for meals to include 15-35% energy as protein, 25-35% energy from fat, 35-60% energy from carbohydrates, less than '200mg'$  of dietary cholesterol, 20-35 gm of total fiber daily;Weight Loss: Understanding of general recommendations for a balanced deficit meal plan, which promotes 1-2 lb weight loss per week and includes a negative energy balance of [phone] kcal/d    Diabetes Yes    Intervention Provide education about signs/symptoms and action to take for hypo/hyperglycemia.;Provide education about proper nutrition, including hydration, and aerobic/resistive exercise prescription along with prescribed medications to achieve blood glucose in normal ranges: Fasting glucose 65-99 mg/dL    Expected Outcomes Short Term: Participant verbalizes understanding of the signs/symptoms and immediate care of hyper/hypoglycemia, proper foot care and importance of medication, aerobic/resistive exercise and nutrition plan for blood glucose control.;Long Term: Attainment of HbA1C < 7%.    Hypertension Yes    Intervention Provide education on lifestyle modifcations including regular physical  activity/exercise, weight management, moderate sodium restriction and increased consumption of fresh fruit, vegetables, and low fat dairy, alcohol moderation, and smoking cessation.;Monitor prescription use compliance.    Expected Outcomes Short Term: Continued assessment and intervention until BP is < 140/59m HG in hypertensive participants. < 130/846mHG in hypertensive participants with diabetes, heart failure or chronic kidney disease.;Long Term: Maintenance of blood pressure at goal levels.    Lipids Yes    Intervention Provide education and support for participant on nutrition & aerobic/resistive exercise along with prescribed medications to achieve LDL '70mg'$ , HDL >'40mg'$ .     Expected Outcomes Short Term: Participant states understanding of desired cholesterol values and is compliant with medications prescribed. Participant is following exercise prescription and nutrition guidelines.;Long Term: Cholesterol controlled with medications as prescribed, with individualized exercise RX and with personalized nutrition plan. Value goals: LDL < '70mg'$ , HDL > 40 mg.           Education:Diabetes - Individual verbal and written instruction to review signs/symptoms of diabetes, desired ranges of glucose level fasting, after meals and with exercise. Acknowledge that pre and post exercise glucose checks will be done for 3 sessions at entry of program.   [facility]  Date 05/24/20  Educator SB  Instruction Review Code 1- VeUnited States Steel Corporationnderstanding      Education: Know Your Numbers and Risk Factors: -Group verbal and written instruction about important numbers in your health.  Discussion of what are risk factors and how they play a role in the disease process.  Review of Cholesterol, Blood Pressure, Diabetes, and BMI and the role they play in your overall health.   Core Components/Risk Factors/Patient Goals Review:    Core Components/Risk Factors/Patient Goals at Discharge (Final Review):    ITP Comments:  ITP Comments    Row Name 05/17/20 1336 05/21/20 1525 05/24/20 0826 05/29/20 0529     ITP Comments Initial telephone orientation completed. Diagnosis can be found in 7/20. EP orientation 8/3 at 2pm Completed 6MWT and gym orientation. Initial ITP created and sent for review to Dr. MaEmily FilbertMedical Director. First full day of exercise!  Patient was oriented to gym and equipment including functions, settings, policies, and procedures.  Patient's individual exercise prescription and treatment plan were reviewed.  All starting workloads were established based on the results of the 6 minute walk test done at initial orientation  visit.  The plan for exercise progression was also introduced and progression will be customized based on patient's performance and goals. 30 Day review completed. Medical Director ITP review done, changes made as directed, and signed approval by Medical Director.  New to program           Comments:
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
## 2020-05-29 NOTE — Telephone Encounter (Signed)
Thank you for making me aware. I'll let our providers covering the hospital know.   Loel Dubonnet, NP

## 2020-05-29 NOTE — ED Notes (Signed)
Pt reports he is not on o2 at home, but will probably be soon as he is having oxygen insufficiencies. Pt reports cpap at night

## 2020-05-29 NOTE — ED Notes (Signed)
Hospitalist to bedside.

## 2020-05-30 ENCOUNTER — Telehealth (INDEPENDENT_AMBULATORY_CARE_PROVIDER_SITE_OTHER): Payer: Self-pay | Admitting: Gastroenterology

## 2020-05-30 ENCOUNTER — Telehealth: Payer: Self-pay

## 2020-05-30 ENCOUNTER — Other Ambulatory Visit: Payer: Self-pay

## 2020-05-30 DIAGNOSIS — R079 Chest pain, unspecified: Secondary | ICD-10-CM

## 2020-05-30 DIAGNOSIS — J449 Chronic obstructive pulmonary disease, unspecified: Secondary | ICD-10-CM

## 2020-05-30 DIAGNOSIS — R0789 Other chest pain: Secondary | ICD-10-CM

## 2020-05-30 DIAGNOSIS — I25118 Atherosclerotic heart disease of native coronary artery with other forms of angina pectoris: Secondary | ICD-10-CM

## 2020-05-30 DIAGNOSIS — I10 Essential (primary) hypertension: Secondary | ICD-10-CM | POA: Diagnosis not present

## 2020-05-30 DIAGNOSIS — Z1211 Encounter for screening for malignant neoplasm of colon: Secondary | ICD-10-CM

## 2020-05-30 DIAGNOSIS — E1159 Type 2 diabetes mellitus with other circulatory complications: Secondary | ICD-10-CM | POA: Diagnosis not present

## 2020-05-30 DIAGNOSIS — E1169 Type 2 diabetes mellitus with other specified complication: Secondary | ICD-10-CM | POA: Diagnosis not present

## 2020-05-30 DIAGNOSIS — E785 Hyperlipidemia, unspecified: Secondary | ICD-10-CM | POA: Diagnosis not present

## 2020-05-30 DIAGNOSIS — E1142 Type 2 diabetes mellitus with diabetic polyneuropathy: Secondary | ICD-10-CM

## 2020-05-30 LAB — BASIC METABOLIC PANEL
Anion gap: 11 (ref 5–15)
BUN: 15 mg/dL (ref 8–23)
CO2: 26 mmol/L (ref 22–32)
Calcium: 10 mg/dL (ref 8.9–10.3)
Chloride: 99 mmol/L (ref 98–111)
Creatinine, Ser: 1.17 mg/dL (ref 0.61–1.24)
GFR calc Af Amer: >60 mL/min (ref >60)
GFR calc non Af Amer: >60 mL/min (ref >60)
Glucose, Bld: 217 mg/dL — ABNORMAL HIGH (ref 70–99)
Potassium: 4.6 mmol/L (ref 3.5–5.1)
Sodium: 136 mmol/L (ref 135–145)

## 2020-05-30 LAB — CBC
HCT: 33.9 % — ABNORMAL LOW (ref 39.0–52.0)
Hemoglobin: 11.2 g/dL — ABNORMAL LOW (ref 13.0–17.0)
MCH: 27.3 pg (ref 26.0–34.0)
MCHC: 33 g/dL (ref 30.0–36.0)
MCV: 82.7 fL (ref 80.0–100.0)
Platelets: 168 10*3/uL (ref 150–400)
RBC: 4.1 MIL/uL — ABNORMAL LOW (ref 4.22–5.81)
RDW: 14.4 % (ref 11.5–15.5)
WBC: 8.7 10*3/uL (ref 4.0–10.5)
nRBC: 0 % (ref 0.0–0.2)

## 2020-05-30 LAB — HEMOGLOBIN A1C
Hgb A1c MFr Bld: 10.3 % — ABNORMAL HIGH (ref 4.8–5.6)
Mean Plasma Glucose: 249 mg/dL

## 2020-05-30 LAB — GLUCOSE, CAPILLARY
Glucose-Capillary: 244 mg/dL — ABNORMAL HIGH (ref 70–99)
Glucose-Capillary: 275 mg/dL — ABNORMAL HIGH (ref 70–99)

## 2020-05-30 LAB — HIV ANTIBODY (ROUTINE TESTING W REFLEX): HIV Screen 4th Generation wRfx: NONREACTIVE

## 2020-05-30 MED ORDER — PEG 3350-KCL-NA BICARB-NACL 420 G PO SOLR: 4000.0000 mL | Freq: Once | ORAL | 0 refills | Status: AC

## 2020-05-30 NOTE — Discharge Summary (Signed)
Physician Discharge Summary  Edward Schneider. XFG:[id number] DOB: 1956-08-07 DOA: 05/29/2020  PCP: [name], NP  Admit date: 05/29/2020 Discharge date: 05/30/2020  Admitted From: home Disposition:  home  Recommendations for Outpatient Follow-up:  1. Follow up with PCP in 1-2 weeks 2. F/u cardio in 1-2 weeks  Home Health: no  Equipment/Devices:  Discharge Condition: stable CODE STATUS: full  Diet recommendation: Heart Healthy   Brief/Interim Summary: HPI was taken from Dr. Blaine Hamper: Edward Schneider. is a 64 y.o. male with medical history significant of hypertension, hyperlipidemia, diabetes mellitus, COPD, asthma, stroke, GERD, seizure, CAD, DES stent placement, liver cancer 2016, C. difficile, BPH, depression, who presents with chest pain.  Patient states that he has been having chest pain for almost 5 days.  It is located in the left side of the chest, constant, severe, sharp, radiating to the left arm.  It is associated with shortness of breath, it seems to be pleuritic, aggravated with deep breath.  Patient also reports that he has cough and coughed up little blood on Sunday and Monday, which has resolved.  Today patient does not have hemolysis.  Patient also has pain in left upper quadrant, denies nausea, vomiting or diarrhea.  No symptoms of UTI.  ED Course: pt was found to have troponin 9 -->5, D-dimer 687, pending COVID-19 PCR, WBC 8.6, electrolytes renal function okay, liver function okay, temperature normal, blood pressure 117/65, heart rate 94, RR 17, oxygen saturation 94% on room air.  Chest x-ray negative.  CT angiogram of the chest is negative for PE.  Patient is placed on progressive plan of observation.  Cardiology, Dr. Mylo Red is consulted.  Hospital course from Dr. Lenise Herald 05/30/20: Pt presented w/ atypical chest pain. The chest pain was secondary MSK etiology as per cardio. No further cardiac work up was recommended as an inpatient. It was recommended that pt go  see his outpatient pain management physician to assess the need for changes in his pain meds. Pt verbalized his understanding.   Discharge Diagnoses:  Principal Problem:   Chest pain Active Problems:   Chronic obstructive pulmonary disease (HCC)   Hyperlipidemia associated with type 2 diabetes mellitus (HCC)   Hypertension associated with diabetes ([place])   DM type 2 with diabetic peripheral neuropathy (HCC)   GERD (gastroesophageal reflux disease)   Depression   CAD (coronary artery disease)   Stroke ([place])   LUQ abdominal pain  Chest pain: atypical & MSK in etiology as per cardio. Hx of CAD s/p of DES 09/05/19. Continue on aspirin, plavix, statin & imdur. [place] of b/l LE neg for DVT  COPD: stable, w/o exacerbations. Continue on bronchodilators  Hyperlipidemia: continue on statin   Hypertension: continue on benazepril. IV hydralazine prn   DM2: recent A1c 9.0, poorly controlled.Hold home dose of Ozempic, Humalog, [place]. Continue on lantus & SSI w/ accuchecks   GERD: continue on pantoprazole, carafate  Depression: severity unknown. Continue on home dose of duloxetine  Hx of CVA: continue on aspirin, plavix & statin   LUQ abdominal pain: etiology unclear. Lipase is WNL. Continue on PPI & carafate    Discharge Instructions  Discharge Instructions    Diet - low sodium heart healthy   Complete by: As directed    Diet Carb Modified   Complete by: As directed    Discharge instructions   Complete by: As directed    F/u PCP in 1-2 weeks. F/u cardio in 1-2 weeks   Increase activity slowly  Complete by: As directed      Allergies as of 05/30/2020      Reactions   Bee Pollen Anaphylaxis   Died 3 times when stung by bees. Carries Epi-pen at all times.   Bee Venom Anaphylaxis   Crestor [rosuvastatin Calcium] Shortness Of Breath, Swelling   Fentanyl Itching, Hives   blisters Patch   Gabapentin Diarrhea   Severe diarrhea which caused incontinence, loss of appetite and  weight loss.   Shellfish Allergy Anaphylaxis, Swelling   Shrimp causes throat to swell and tingling in tongue. Can eat other white fish, crabcakes, and oysters.   Furosemide Nausea And Vomiting   Buprenorphine Hcl Itching   Morphine Itching   Simvastatin Diarrhea      Medication List    TAKE these medications   albuterol 108 (90 Base) MCG/ACT inhaler Commonly known as: Ventolin HFA Inhale 2 puffs into the lungs every 6 (six) hours as needed for wheezing or shortness of breath.   aspirin 81 MG EC tablet Take 1 tablet (81 mg total) by mouth daily.   atorvastatin 80 MG tablet Commonly known as: LIPITOR Take 1 tablet (80 mg total) by mouth daily.   benazepril 10 MG tablet Commonly known as: LOTENSIN Take 1 tablet by mouth once daily   calcium carbonate 600 MG Tabs tablet Commonly known as: OS-CAL Take 600 mg by mouth daily.   cephALEXin 500 MG capsule Commonly known as: KEFLEX Take 500 mg by mouth 2 (two) times daily.   CINNAMON PO Take 1,000 mg by mouth 2 (two) times daily.   clopidogrel 75 MG tablet Commonly known as: PLAVIX Take 1 tablet (75 mg total) by mouth daily with breakfast.   cyclobenzaprine 10 MG tablet Commonly known as: FLEXERIL Take 1 tablet (10 mg total) by mouth 3 (three) times daily as needed for muscle spasms.   diclofenac sodium 1 % Gel Commonly known as: VOLTAREN Apply 2 g topically 4 (four) times daily.   diphenhydrAMINE 25 mg capsule Commonly known as: BENADRYL Take 25 mg by mouth 2 (two) times daily.   doxepin 25 MG capsule Commonly known as: SINEQUAN Take 50 mg by mouth at bedtime.   DULoxetine 60 MG capsule Commonly known as: CYMBALTA Take 1 capsule (60 mg total) by mouth daily. What changed: when to take this   DULoxetine 30 MG capsule Commonly known as: CYMBALTA Take 30 mg by mouth 2 (two) times daily. What changed: Another medication with the same name was changed. Make sure you understand how and when to take each.    EPINEPHrine 0.3 mg/0.3 mL Soaj injection Commonly known as: EPI-PEN Inject 0.3 mLs (0.3 mg total) into the muscle as needed for anaphylaxis.   GINGER PO Take 1 Dose by mouth daily.   Ginseng 100 MG Caps Take by mouth.   HAIR SKIN AND NAILS FORMULA PO Take 1 capsule by mouth daily.   insulin lispro 100 UNIT/ML KwikPen Commonly known as: HumaLOG KwikPen Inject 0.16 mLs (16 Units total) into the skin 2 (two) times daily with breakfast and lunch AND 0.18 mLs (18 Units total) daily with supper.   isosorbide mononitrate 60 MG 24 hr tablet Commonly known as: IMDUR Take 1.5 tablets (90 mg total) by mouth daily.   mupirocin ointment 2 % Commonly known as: BACTROBAN Apply 1 application topically daily.   Narcan 4 MG/0.1ML Liqd nasal spray kit Generic drug: naloxone Place 0.4 mg into the nose once.   nitroGLYCERIN 0.4 MG SL tablet Commonly known as: NITROSTAT  Place 1 tablet (0.4 mg total) under the tongue every 5 (five) minutes as needed for chest pain.   Omega-3 1000 MG Caps Take 1,000 mg by mouth daily.   oxyCODONE 15 MG immediate release tablet Commonly known as: ROXICODONE TAKE 1/2 TO 1 (ONE HALF TO ONE) TABLET BY MOUTH FOUR TO SIX TIMES DAILY IF TOLERATED NOTE TABLET IS 15 MG What changed:   how much to take  how to take this  when to take this  reasons to take this   Ozempic (0.25 or 0.5 MG/DOSE) 2 MG/1.5ML Sopn Generic drug: Semaglutide(0.25 or 0.5MG/DOS) Inject 0.5 mg into the skin once a week.   pantoprazole 40 MG tablet Commonly known as: PROTONIX Take 1 tablet by mouth twice daily   polyethylene glycol-electrolytes 420 g solution Commonly known as: NuLYTELY Take 4,000 mLs by mouth once for 1 dose.   pregabalin 50 MG capsule Commonly known as: LYRICA TAKE 1 CAPSULE BY MOUTH THREE TIMES DAILY   Stiolto Respimat 2.5-2.5 MCG/ACT Aers Generic drug: Tiotropium Bromide-Olodaterol Inhale 2 puffs into the lungs daily.   sucralfate 1 g tablet Commonly  known as: CARAFATE TAKE 1 TABLET BY MOUTH 4 TIMES DAILY... WITH MEALS AND AT BEDTIME What changed:   how much to take  how to take this  when to take this   traZODone 100 MG tablet Commonly known as: DESYREL Take 1 tablet (100 mg total) by mouth at bedtime as needed for sleep.   Tyler Aas FlexTouch 100 UNIT/ML FlexTouch Pen Generic drug: insulin degludec Inject 0.28 mLs (28 Units total) into the skin daily.   triamcinolone cream 0.1 % Commonly known as: KENALOG Apply 1 application topically 2 (two) times daily.   vitamin A 10000 UNIT capsule Take 10,000 Units by mouth daily.   vitamin B-12 1000 MCG tablet Commonly known as: CYANOCOBALAMIN Take 1,000 mcg by mouth daily.   vitamin C 1000 MG tablet Take 1,000 mg by mouth daily.   Vitamin D3 25 MCG (1000 UT) Chew Chew 1 capsule by mouth daily.   Vitamin E 400 units Tabs Take 400 Units by mouth daily.       Allergies  Allergen Reactions  . Bee Pollen Anaphylaxis    Died 3 times when stung by bees. Carries Epi-pen at all times.   . Bee Venom Anaphylaxis  . Crestor [Rosuvastatin Calcium] Shortness Of Breath and Swelling  . Fentanyl Itching and Hives    blisters Patch  . Gabapentin Diarrhea    Severe diarrhea which caused incontinence, loss of appetite and weight loss.  . Shellfish Allergy Anaphylaxis and Swelling    Shrimp causes throat to swell and tingling in tongue. Can eat other white fish, crabcakes, and oysters.  . Furosemide Nausea And Vomiting  . Buprenorphine Hcl Itching  . Morphine Itching  . Simvastatin Diarrhea    Consultations:  Cardio, Dr. Mylo Red   Procedures/Studies: DG Chest 2 View  Result Date: 05/29/2020 CLINICAL DATA:  Chest pain EXAM: CHEST - 2 VIEW COMPARISON:  03/05/2020 chest radiograph and prior. FINDINGS: Stable appearance of fourth right rib bone island. No new focal airspace opacity. No pneumothorax or pleural effusion. Cardiomediastinal silhouette is unchanged. No acute osseous  abnormality. IMPRESSION: No acute airspace disease. Electronically Signed   By: Primitivo Gauze M.D.   On: 05/29/2020 08:48   CT Angio Chest PE W and/or Wo Contrast  Result Date: 05/29/2020 CLINICAL DATA:  Chest pain for 5 days. EXAM: CT ANGIOGRAPHY CHEST WITH CONTRAST TECHNIQUE: Multidetector CT imaging of the  chest was performed using the standard protocol during bolus administration of intravenous contrast. Multiplanar CT image reconstructions and MIPs were obtained to evaluate the vascular anatomy. CONTRAST:  141m OMNIPAQUE IOHEXOL 350 MG/ML SOLN COMPARISON:  05/08/2020. FINDINGS: Cardiovascular: Negative for pulmonary embolus. Atherosclerotic calcification of the aorta and coronary arteries. Heart is enlarged. No pericardial effusion. Mediastinum/Nodes: No pathologically enlarged mediastinal, hilar or axillary lymph nodes. Esophagus is grossly unremarkable. Lungs/Pleura: Image quality is degraded by expiratory phase imaging and respiratory motion. Mild dependent atelectasis bilaterally. Lungs are otherwise clear. No pleural fluid. Airway is unremarkable. Upper Abdomen: Liver is decreased in attenuation diffusely. There is hypertrophy of the left hepatic lobe and caudate. Visualized portions of the adrenal glands, right kidney, spleen, pancreas, stomach and bowel are unremarkable. Periportal lymph node measures 11 mm, unchanged and likely reactive. Musculoskeletal: Degenerative changes in the spine. No worrisome lytic or sclerotic lesions. Review of the MIP images confirms the above findings. IMPRESSION: 1. Negative for pulmonary embolus. 2. Hepatic steatosis. Hypertrophy of the left hepatic lobe and caudate is indicative of cirrhosis. 3. Aortic atherosclerosis (ICD10-I70.0). Coronary artery calcification. Electronically Signed   By: MLorin PicketM.D.   On: 05/29/2020 10:00   [place] Img Lower Bilateral (DVT)  Result Date: 05/29/2020 CLINICAL DATA:  Positive D-dimer.  Lower extremity pain.  EXAM: BILATERAL LOWER EXTREMITY VENOUS DOPPLER ULTRASOUND TECHNIQUE: Gray-scale sonography with compression, as well as color and duplex ultrasound, were performed to evaluate the deep venous system(s) from the level of the common femoral vein through the popliteal and proximal calf veins. COMPARISON:  None. FINDINGS: VENOUS Normal compressibility of the common femoral, superficial femoral, and popliteal veins, as well as the visualized calf veins. Visualized portions of profunda femoral vein and great saphenous vein unremarkable. No filling defects to suggest DVT on grayscale or color Doppler imaging. Doppler waveforms show normal direction of venous flow, normal respiratory plasticity and response to augmentation. OTHER None. Limitations: none IMPRESSION: Negative. Electronically Signed   By: CConstance HolsterM.D.   On: 05/29/2020 18:26   CT CHEST LUNG CANCER SCREENING LOW DOSE WO CONTRAST  Result Date: 05/08/2020 CLINICAL DATA:  Ex-smoker, quitting 8 years ago. One hundred pack-year history. EXAM: CT CHEST WITHOUT CONTRAST LOW-DOSE FOR LUNG CANCER SCREENING TECHNIQUE: Multidetector CT imaging of the chest was performed following the standard protocol without IV contrast. COMPARISON:  03/05/2020 chest radiograph. CTA chest 08/28/2019. Lung cancer screening CT 05/02/2019. FINDINGS: Cardiovascular: Aortic atherosclerosis. Tortuous thoracic aorta. Borderline cardiomegaly, without pericardial effusion. Multivessel coronary artery atherosclerosis. Mediastinum/Nodes: No mediastinal or definite hilar adenopathy, given limitations of unenhanced CT. Lungs/Pleura: No pleural fluid. Bilateral pulmonary nodules of maximally volume derived equivalent diameter 3.7 mm. Pleural-based left lower lobe pulmonary nodule versus area of pleural thickening is similar at volume derived equivalent diameter 3.9 mm. Upper Abdomen: Moderate hepatic steatosis. Normal imaged portions of the spleen, stomach, adrenal glands, kidneys. Fatty  replacement throughout the pancreas. Normal imaged gallbladder. Musculoskeletal: Lower cervical and mid to lower thoracic spondylosis. IMPRESSION: 1. Lung-RADS 2, benign appearance or behavior. Continue annual screening with low-dose chest CT without contrast in 12 months. 2. Hepatic steatosis. 3. Aortic Atherosclerosis (ICD10-I70.0) and Emphysema (ICD10-J43.9). Coronary artery atherosclerosis. Electronically Signed   By: KAbigail MiyamotoM.D.   On: 05/08/2020 14:03      Subjective: Pt c/o pain over chest, back & neck. Pt has chronic pain.    Discharge Exam: Vitals:   05/30/20 0823 05/30/20 1125  BP:  (!) 145/78  Pulse:  86  Resp:  17  Temp:  98.3 F (36.8 C)  SpO2: 94% 94%   Vitals:   05/30/20 0549 05/30/20 0759 05/30/20 0823 05/30/20 1125  BP: 103/67 120/73  (!) 145/78  Pulse: 92 84  86  Resp: _0 Temp: 98.5 F (36.9 C) 98.6 F (37 C)  98.3 F (36.8 C)  TempSrc: Oral Oral  Oral  SpO2: 95% 94% 94% 94%  Weight:      Height:        General: Pt is alert, awake, not in acute distress Cardiovascular: S1/S2 +, no rubs, no gallops Respiratory: CTA bilaterally, no wheezing, no rhonchi Abdominal: Soft, NT, obese, bowel sounds + Extremities:  no cyanosis    The results of significant diagnostics from this hospitalization (including imaging, microbiology, ancillary and laboratory) are listed below for reference.     Microbiology: Recent Results (from the past 240 hour(s))  SARS Coronavirus 2 by RT PCR (hospital order, performed in [facility] hospital lab) Nasopharyngeal Nasopharyngeal Swab     Status: None   Collection Time: 05/29/20 11:42 AM   Specimen: Nasopharyngeal Swab  Result Value Ref Range Status   SARS Coronavirus 2 NEGATIVE NEGATIVE Final    Comment: (NOTE) SARS-CoV-2 target nucleic acids are NOT DETECTED.  The SARS-CoV-2 RNA is generally detectable in upper and lower respiratory specimens during the acute phase of infection. The lowest concentration of  SARS-CoV-2 viral copies this assay can detect is 250 copies / mL. A negative result does not preclude SARS-CoV-2 infection and should not be used as the sole basis for treatment or other patient management decisions.  A negative result may occur with improper specimen collection / handling, submission of specimen other than nasopharyngeal swab, presence of viral mutation(s) within the areas targeted by this assay, and inadequate number of viral copies (<250 copies / mL). A negative result must be combined with clinical observations, patient history, and epidemiological information.  Fact Sheet for Patients:   StrictlyIdeas.no  Fact Sheet for Healthcare Providers: BankingDealers.co.za  This test is not yet approved or  cleared by the [place] FDA and has been authorized for detection and/or diagnosis of SARS-CoV-2 by FDA under an Emergency Use Authorization (EUA).  This EUA will remain in effect (meaning this test can be used) for the duration of the COVID-19 declaration under Section 564(b)(1) of the Act, 21 U.S.C. section 360bbb-3(b)(1), unless the authorization is terminated or revoked sooner.  Performed at [facility], [address] [place], [place] [address]      Labs: BNP (last 3 results) Recent Labs    05/29/20 0818  BNP 25.0   Basic Metabolic Panel: Recent Labs  Lab 05/29/20 0818 05/30/20 0603  NA 131* 136  K 4.4 4.6  CL 98 99  CO2 22 26  GLUCOSE 348* 217*  BUN 21 15  CREATININE 1.11 1.17  CALCIUM 9.4 10.0   Liver Function Tests: Recent Labs  Lab 05/29/20 0818  AST 41  ALT 34  ALKPHOS 69  BILITOT 1.0  PROT 7.2  ALBUMIN 4.0   Recent Labs  Lab 05/29/20 1057  LIPASE 40   No results for input(s): AMMONIA in the last 168 hours. CBC: Recent Labs  Lab 05/29/20 0818 05/30/20 0603  WBC 8.6 8.7  HGB 11.4* 11.2*  HCT 33.3* 33.9*  MCV 81.0 82.7  PLT 192 168   Cardiac Enzymes: No  results for input(s): CKTOTAL, CKMB, CKMBINDEX, TROPONINI in the last 168 hours. BNP: Invalid input(s): POCBNP CBG: Recent Labs  Lab  05/24/20 0821 05/29/20 1606 05/29/20 2112 05/30/20 0759 05/30/20 1127  GLUCAP 225* 211* 223* 244* 275*   D-Dimer No results for input(s): DDIMER in the last 72 hours. Hgb A1c Recent Labs    05/29/20 1527  HGBA1C 10.3*   Lipid Profile No results for input(s): CHOL, HDL, LDLCALC, TRIG, CHOLHDL, LDLDIRECT in the last 72 hours. Thyroid function studies No results for input(s): TSH, T4TOTAL, T3FREE, THYROIDAB in the last 72 hours.  Invalid input(s): FREET3 Anemia work up No results for input(s): VITAMINB12, FOLATE, FERRITIN, TIBC, IRON, RETICCTPCT in the last 72 hours. Urinalysis    Component Value Date/Time   COLORURINE YELLOW (A) 08/22/2019 2025   APPEARANCEUR CLEAR (A) 08/22/2019 2025   APPEARANCEUR Clear 04/27/2018 1548   LABSPEC 1.008 08/22/2019 2025   LABSPEC 1.013 02/28/2014 1844   PHURINE 5.0 08/22/2019 2025   GLUCOSEU NEGATIVE 08/22/2019 2025   GLUCOSEU Negative 02/28/2014 1844   HGBUR NEGATIVE 08/22/2019 2025   BILIRUBINUR NEGATIVE 08/22/2019 2025   BILIRUBINUR Negative 04/27/2018 1548   BILIRUBINUR Negative 02/28/2014 1844   KETONESUR NEGATIVE 08/22/2019 2025   PROTEINUR NEGATIVE 08/22/2019 2025   NITRITE NEGATIVE 08/22/2019 2025   LEUKOCYTESUR NEGATIVE 08/22/2019 2025   LEUKOCYTESUR Negative 02/28/2014 1844   Sepsis Labs Invalid input(s): PROCALCITONIN,  WBC,  LACTICIDVEN Microbiology Recent Results (from the past 240 hour(s))  SARS Coronavirus 2 by RT PCR (hospital order, performed in [place] hospital lab) Nasopharyngeal Nasopharyngeal Swab     Status: None   Collection Time: 05/29/20 11:42 AM   Specimen: Nasopharyngeal Swab  Result Value Ref Range Status   SARS Coronavirus 2 NEGATIVE NEGATIVE Final    Comment: (NOTE) SARS-CoV-2 target nucleic acids are NOT DETECTED.  The SARS-CoV-2 RNA is generally detectable in  upper and lower respiratory specimens during the acute phase of infection. The lowest concentration of SARS-CoV-2 viral copies this assay can detect is 250 copies / mL. A negative result does not preclude SARS-CoV-2 infection and should not be used as the sole basis for treatment or other patient management decisions.  A negative result may occur with improper specimen collection / handling, submission of specimen other than nasopharyngeal swab, presence of viral mutation(s) within the areas targeted by this assay, and inadequate number of viral copies (<250 copies / mL). A negative result must be combined with clinical observations, patient history, and epidemiological information.  Fact Sheet for Patients:   StrictlyIdeas.no  Fact Sheet for Healthcare Providers: BankingDealers.co.za  This test is not yet approved or  cleared by the [place] FDA and has been authorized for detection and/or diagnosis of SARS-CoV-2 by FDA under an Emergency Use Authorization (EUA).  This EUA will remain in effect (meaning this test can be used) for the duration of the COVID-19 declaration under Section 564(b)(1) of the Act, 21 U.S.C. section 360bbb-3(b)(1), unless the authorization is terminated or revoked sooner.  Performed at [facility], [address], [place], [place] [address]      Time coordinating discharge: Over 30 minutes  SIGNED:   Wyvonnia Dusky, MD  [facility] 05/30/2020, 1:21 PM Pager   If 7PM-7AM, please contact night-coverage [email or web address]

## 2020-05-30 NOTE — Progress Notes (Signed)
Progress Note  Patient Name: Edward Schneider. Date of Encounter: 05/30/2020  Primary Cardiologist: End  Subjective   Complaints this morning include bad food, wanting his pain medication and ice. He continues to note chronic back pain.   Inpatient Medications    Scheduled Meds: . arformoterol  15 mcg Nebulization Daily  . vitamin C  1,000 mg Oral Daily  . aspirin EC  81 mg Oral Daily  . atorvastatin  80 mg Oral Daily  . benazepril  10 mg Oral Daily  . calcium carbonate  500 mg Oral Daily  . cephALEXin  500 mg Oral BID  . cholecalciferol  1,000 Units Oral Daily  . clopidogrel  75 mg Oral Q breakfast  . doxepin  50 mg Oral QHS  . DULoxetine  30 mg Oral BID  . DULoxetine  60 mg Oral QHS  . enoxaparin (LOVENOX) injection  40 mg Subcutaneous Q24H  . insulin aspart  0-5 Units Subcutaneous QHS  . insulin aspart  0-9 Units Subcutaneous TID WC  . insulin glargine  20 Units Subcutaneous Daily  . omega-3 acid ethyl esters  1,000 mg Oral Daily  . pantoprazole  40 mg Oral BID  . pneumococcal 23 valent vaccine  0.5 mL Intramuscular Tomorrow-1000  . pregabalin  50 mg Oral TID  . sucralfate  1 g Oral TID WC & HS  . umeclidinium bromide  1 puff Inhalation Daily  . vitamin A  10,000 Units Oral Daily  . vitamin B-12  1,000 mcg Oral Daily  . vitamin E  400 Units Oral Daily   Continuous Infusions:  PRN Meds: acetaminophen, albuterol, hydrALAZINE, HYDROmorphone (DILAUDID) injection, nitroGLYCERIN, ondansetron (ZOFRAN) IV, oxyCODONE, traZODone   Vital Signs    Vitals:   05/29/20 2020 05/30/20 0549 05/30/20 0759 05/30/20 0823  BP: 128/80 103/67 120/73   Pulse: (!) 58 92 84   Resp: 20 16 17    Temp: 98.2 F (36.8 C) 98.5 F (36.9 C) 98.6 F (37 C)   TempSrc: Oral Oral Oral   SpO2: 100% 95% 94% 94%  Weight: 113.5 kg     Height: 5\' 8"  (1.727 m)       Intake/Output Summary (Last 24 hours) at 05/30/2020 0911 Last data filed at 05/30/2020 0330 Gross per 24 hour  Intake --   Output 775 ml  Net -775 ml   Filed Weights   05/29/20 0810 05/29/20 2020  Weight: 113.4 kg 113.5 kg    Telemetry    SR, rare PVC - Personally Reviewed  ECG    NSR, 84 bpm, 1st degree AV block, no acute st/t changes - Personally Reviewed  Physical Exam   GEN: No acute distress.   Neck: No JVD. Cardiac: RRR, no murmurs, rubs, or gallops. Symptoms reproducible to palpation on exam. Respiratory: Clear to auscultation bilaterally.  GI: Soft, nontender, non-distended.   MS: No edema; No deformity. Neuro:  Alert and oriented x 3; Nonfocal.  Psych: Normal affect.  Labs    Chemistry Recent Labs  Lab 05/29/20 0818 05/30/20 0603  NA 131* 136  K 4.4 4.6  CL 98 99  CO2 22 26  GLUCOSE 348* 217*  BUN 21 15  CREATININE 1.11 1.17  CALCIUM 9.4 10.0  PROT 7.2  --   ALBUMIN 4.0  --   AST 41  --   ALT 34  --   ALKPHOS 69  --   BILITOT 1.0  --   GFRNONAA >60 >60  GFRAA >60 >60  ANIONGAP 11 11     Hematology Recent Labs  Lab 05/29/20 0818 05/30/20 0603  WBC 8.6 8.7  RBC 4.11* 4.10*  HGB 11.4* 11.2*  HCT 33.3* 33.9*  MCV 81.0 82.7  MCH 27.7 27.3  MCHC 34.2 33.0  RDW 14.4 14.4  PLT 192 168    Cardiac EnzymesNo results for input(s): TROPONINI in the last 168 hours. No results for input(s): TROPIPOC in the last 168 hours.   BNP Recent Labs  Lab 05/29/20 0818  BNP 25.3     DDimer No results for input(s): DDIMER in the last 168 hours.   Radiology    DG Chest 2 View  Result Date: 05/29/2020 IMPRESSION: No acute airspace disease. Electronically Signed   By: Primitivo Gauze M.D.   On: 05/29/2020 08:48   CT Angio Chest PE W and/or Wo Contrast  Result Date: 05/29/2020 IMPRESSION: 1. Negative for pulmonary embolus. 2. Hepatic steatosis. Hypertrophy of the left hepatic lobe and caudate is indicative of cirrhosis. 3. Aortic atherosclerosis (ICD10-I70.0). Coronary artery calcification. Electronically Signed   By: Lorin Picket M.D.   On: 05/29/2020 10:00    US Venous Img Lower Bilateral (DVT)  Result Date: 05/29/2020 IMPRESSION: Negative. Electronically Signed   By: Constance Holster M.D.   On: 05/29/2020 18:26    Cardiac Studies   2D echo 11/2019: 1. Left ventricular ejection fraction, by estimation, is 60 to 65%. The  left ventricle has normal function. The left ventricle has no regional  wall motion abnormalities. Left ventricular diastolic parameters are  consistent with Grade I diastolic  dysfunction (impaired relaxation).  2. Right ventricular systolic function is normal. The right ventricular  size is normal. Tricuspid regurgitation signal is inadequate for assessing  PA pressure.  __________  LHC 08/2019: Conclusions: 1. Two vessel coronary artery disease with 60% proximal LAD stenosis that is hemodynamically significant (iFR 0.87) and moderate disease involving rPL branches. 2. Normal left ventricular systolic function and filling pressure. 3. Successful PCI to proximal/mid LAD using Resolute Onyx 2.75 x 18 mm drug-eluting stent (post-dilated to 3.4 mm) with 0% residual stenosis and TIMI-3 flow.  Recommendations: 1. Overnight extended recovery. 2. Dual antiplatelet therapy with aspirin and clopidogrel for at least 12 months. 3. Aggressive secondary prevention. 4. Medical management of rPL disease.   Patient Profile     64 y.o. male with history of CAD s/p PCI to the mid LAD in 08/2019, DM2, HTN, HLD, and chronic pain on narcotic therapy who we are asked to see for atypical chest pain.   Assessment & Plan    1. Atypical chest pain: -"Feels like a knife is in there" -HS-Tn negative x 4 -EKG not acute -No plans for inpatient testing  2. CAD involving the native coronary arteries without angina: -Chest pain is atypical and consistent with MSK/chronic pain etiology -Continue current medical therapy including ASA, Plavix, Imdur, Lipitor, benazepril  -Cardiac rehab -Outpatient follow up  3. HTN: -Blood pressure  well controlled -Continue Imdur and benazepril   4. HLD: -LDL 66 -Lipitor   5. DM2: -A1c 10.3 -Follow up with PCP   Dispo: -From a cardiac perspective he can be discharged   For questions or updates, please contact [place] Please consult [email or web address] for contact info under Cardiology/STEMI.    Signed, Christell Faith, PA-C [place] Pager: [phone] 05/30/2020, 9:11 AM

## 2020-05-30 NOTE — Plan of Care (Signed)

## 2020-05-30 NOTE — Progress Notes (Signed)
Patient given discharge instructions. IV taken out and tele monitor off. Patient verbalized understanding without any questions or concerns.  

## 2020-05-30 NOTE — Progress Notes (Signed)
Triage previously completed.  Cardiac Clearance will be obtained due to yesterday's hospitalization for chest pain.  Thanks,  [place], [place]

## 2020-05-30 NOTE — Progress Notes (Signed)
This is a colonoscopy reschedule for Mr.Yurchak due to cost of bowel prep he had to cancel.  Mr. Flury is currently in the hospital for chest pain, however he wanted to schedule his colonoscopy still.  He has been advised that I will obtain a cardiac clearance from Dr. Marisue Humble office to make sure he is clear to have his colonoscopy as we have scheduled on Tuesday 06/18/20 at [facility]

## 2020-05-30 NOTE — Telephone Encounter (Signed)
Patient called, reported that he will be discharged from the hospital today. His next day to come back to Cardiac Rehab will be next Wednesday, 8/18. Patient stated he will bring a clearance letter.

## 2020-05-31 NOTE — Telephone Encounter (Signed)
Called and spoke with patient. Let him know that [facility] had tried to reach out to get him scheduled to discuss his breathing and oxygen. Provided patient with the phone number to [place] to call and schedule his appointment.

## 2020-06-03 ENCOUNTER — Ambulatory Visit: Payer: PPO | Admitting: Internal Medicine
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
## 2020-06-03 NOTE — Telephone Encounter (Signed)
Called and Left a voice mail with pt to let him know that referral was sent to [facility] [place]. Pt just needs to give them a call back to schedule.

## 2020-06-03 NOTE — Telephone Encounter (Signed)
Patient's wife would like to know if patient is able to get his oxygen closer to [place] is a little to far to drive.

## 2020-06-04 ENCOUNTER — Telehealth: Payer: Self-pay | Admitting: Pulmonary Disease

## 2020-06-04 NOTE — Telephone Encounter (Signed)
Left message for patient

## 2020-06-05 ENCOUNTER — Ambulatory Visit: Payer: Medicare Other | Admitting: Internal Medicine

## 2020-06-05 NOTE — Telephone Encounter (Signed)
Lm x2 for patient.   

## 2020-06-05 NOTE — Telephone Encounter (Signed)
Pt returning a phone call. Pt can be reached at [phone].

## 2020-06-05 NOTE — Telephone Encounter (Signed)
Patient is aware that 06/28/2020 is the first available. Recommended that he call our office to see if we have any cancelations, as we do not have a cancelation list.  Patient voiced his understanding and had no further questions.  Nothing further is needed at this time.

## 2020-06-06 ENCOUNTER — Ambulatory Visit (INDEPENDENT_AMBULATORY_CARE_PROVIDER_SITE_OTHER): Payer: PPO | Admitting: Internal Medicine

## 2020-06-06 ENCOUNTER — Encounter: Payer: Self-pay | Admitting: Internal Medicine

## 2020-06-06 ENCOUNTER — Telehealth: Payer: Self-pay

## 2020-06-06 ENCOUNTER — Other Ambulatory Visit: Payer: Self-pay

## 2020-06-06 VITALS — BP 120/60 | Ht 68.0 in | Wt 247.2 lb

## 2020-06-06 DIAGNOSIS — R0902 Hypoxemia: Secondary | ICD-10-CM | POA: Diagnosis not present

## 2020-06-06 DIAGNOSIS — R0602 Shortness of breath: Secondary | ICD-10-CM | POA: Diagnosis not present

## 2020-06-06 DIAGNOSIS — R079 Chest pain, unspecified: Secondary | ICD-10-CM | POA: Diagnosis not present

## 2020-06-06 DIAGNOSIS — I10 Essential (primary) hypertension: Secondary | ICD-10-CM

## 2020-06-06 DIAGNOSIS — R55 Syncope and collapse: Secondary | ICD-10-CM

## 2020-06-06 DIAGNOSIS — I2510 Atherosclerotic heart disease of native coronary artery without angina pectoris: Secondary | ICD-10-CM

## 2020-06-06 NOTE — Telephone Encounter (Signed)
06/06/20 Left message on voicemail for patient to return my call regarding financial assistance. Will attempt to call again 06/07/20. Ambrose Mantle [phone]
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
## 2020-06-06 NOTE — Patient Instructions (Signed)
Stop at pulmonary to get a sooner appointment as you leave today.  Medication Instructions:  Your physician recommends that you continue on your current medications as directed. Please refer to the Current Medication list given to you today.  *If you need a refill on your cardiac medications before your next appointment, please call your pharmacy*  Follow-Up: At [facility], you and your health needs are our priority.  As part of our continuing mission to provide you with exceptional heart care, we have created designated Provider Care Teams.  These Care Teams include your primary Cardiologist (physician) and Advanced Practice Providers (APPs -  Physician Assistants and Nurse Practitioners) who all work together to provide you with the care you need, when you need it.  We recommend signing up for the patient portal called "MyChart".  Sign up information is provided on this After Visit Summary.  MyChart is used to connect with patients for Virtual Visits (Telemedicine).  Patients are able to view lab/test results, encounter notes, upcoming appointments, etc.  Non-urgent messages can be sent to your provider as well.   To learn more about what you can do with MyChart, go to [email or web address].    Your next appointment:   As scheduled with Dr End in November.

## 2020-06-07 ENCOUNTER — Telehealth: Payer: Self-pay

## 2020-06-07 NOTE — Telephone Encounter (Signed)
Left message for patient.  Per Dr. Patsey Berthold verbally- okay to offer 06/12/2020 at 1:30.

## 2020-06-07 NOTE — Telephone Encounter (Signed)
-----   Message from Catha Gosselin sent at 06/07/2020  7:53 AM EDT ----- Regarding: RE: Needs sooner appt Sending this message to Claudette Head, CMA Thanks, Suanne Marker ----- Message ----- From: Annia Belt, RN Sent: 06/06/2020   5:00 PM EDT To: Edward Ivory Rimmer, RN, # Subject: Needs sooner appt                              Dr End saw patient today. Dr End is concerned about his worsening shortness of breath. He feels patient needs to be seen sooner than as scheduled in September. Is there any way for patient to be seen in [place] in the next week or 2?  We appreciate your help. Please le me know if I can help in any way. Sincerely,  Edmonia Lynch., RN   BSN [place]  [phone] (phone) [phone] (fax)

## 2020-06-07 NOTE — Telephone Encounter (Signed)
Patient has been scheduled for consult on 06/12/2020 at 1:30. Patient is aware and voiced his understanding.  Nothing further is needed at this time.

## 2020-06-08 ENCOUNTER — Encounter: Payer: Self-pay | Admitting: Internal Medicine

## 2020-06-08 DIAGNOSIS — R0600 Dyspnea, unspecified: Secondary | ICD-10-CM | POA: Insufficient documentation

## 2020-06-08 DIAGNOSIS — R0602 Shortness of breath: Secondary | ICD-10-CM | POA: Insufficient documentation

## 2020-06-08 DIAGNOSIS — R0902 Hypoxemia: Secondary | ICD-10-CM | POA: Insufficient documentation

## 2020-06-11 NOTE — Telephone Encounter (Signed)
MA8/24/2021   Name: Edward Schneider.   MRN: [id number]   DOB: [birth year]   AGE: 64 y.o.   GENDER: male   PCP Marjie Skiff, NP.   06/11/20 Spoke with patient about financial resources patient stated that he did not need assistance at this time.  Closing referral.    Can Lucci, AAS Paralegal, [facility] Care Guide . Embedded Care Coordination [facility]  Care Management  [address] [place], [place] [address] [email or web address]  [phone]   [email or web address]

## 2020-06-12 ENCOUNTER — Encounter: Payer: Self-pay | Admitting: Pulmonary Disease

## 2020-06-12 ENCOUNTER — Other Ambulatory Visit
Admission: RE | Admit: 2020-06-12 | Discharge: 2020-06-12 | Disposition: A | Discharge status: Home / Self Care | Payer: PPO | Attending: Pulmonary Disease | Admitting: Pulmonary Disease

## 2020-06-12 ENCOUNTER — Other Ambulatory Visit: Payer: Self-pay

## 2020-06-12 ENCOUNTER — Ambulatory Visit: Payer: PPO | Admitting: Pulmonary Disease

## 2020-06-12 VITALS — BP 122/68 | HR 99 | Temp 97.3°F | Ht 68.0 in | Wt 247.0 lb

## 2020-06-12 DIAGNOSIS — M5136 Other intervertebral disc degeneration, lumbar region: Secondary | ICD-10-CM | POA: Diagnosis not present

## 2020-06-12 DIAGNOSIS — J449 Chronic obstructive pulmonary disease, unspecified: Secondary | ICD-10-CM | POA: Insufficient documentation

## 2020-06-12 DIAGNOSIS — K219 Gastro-esophageal reflux disease without esophagitis: Secondary | ICD-10-CM

## 2020-06-12 DIAGNOSIS — E65 Localized adiposity: Secondary | ICD-10-CM

## 2020-06-12 DIAGNOSIS — G894 Chronic pain syndrome: Secondary | ICD-10-CM | POA: Diagnosis not present

## 2020-06-12 DIAGNOSIS — R0602 Shortness of breath: Secondary | ICD-10-CM

## 2020-06-12 DIAGNOSIS — G4733 Obstructive sleep apnea (adult) (pediatric): Secondary | ICD-10-CM | POA: Diagnosis not present

## 2020-06-12 DIAGNOSIS — M545 Low back pain: Secondary | ICD-10-CM | POA: Diagnosis not present

## 2020-06-12 MED ORDER — BREZTRI AEROSPHERE 160-9-4.8 MCG/ACT IN AERO: 2.0000 | INHALATION_SPRAY | Freq: Two times a day (BID) | RESPIRATORY_TRACT | 0 refills | Status: AC

## 2020-06-12 NOTE — Progress Notes (Signed)
 Assessment & Plan:  1. Chronic obstructive pulmonary disease, unspecified COPD type (HCC) (Primary) - Alpha-1 antitrypsin phenotype; Future  2. Shortness of breath  3. Gastroesophageal reflux disease, unspecified whether esophagitis present  4. Truncal obesity   Patient Instructions  We are getting a blood test to check for certain hereditary forms of emphysema.  We are going to get some breathing tests.  We are going to get an overnight oxygen  test on your CPAP to make sure you are getting off oxygen .  We are giving you a trial of Breztri  2 puffs twice a day.  Make sure you rinse your mouth well after you use it.  You may put a little baking soda in the water  to rinse with.  You may use your Ventolin  (albuterol , blue inhaler) times a day ONLY IF NEEDED as this is your rescue inhaler.  DO NOT USE ANY OTHER INHALERS WHILE USING BREZTRI  (E.G. INCRUSE, SPIRIVA, BREO).  Please talk to Dr. Edith about your reflux symptoms.  I recommend weight loss as weight is likely affecting her breathing.  We will see you in follow-up in 2 to 3 months time call sooner should any new problems arise.     Please note: late entry documentation due to logistical difficulties during COVID-19 pandemic. This note is filed for information purposes only, and is not intended to be used for billing, nor does it represent the full scope/nature of the visit in question. Please see any associated scanned media linked to date of encounter for additional pertinent information.  Subjective:    HPI: Edward Schneider. is a 64 y.o. male presenting to the pulmonology clinic on 06/12/2020 with report of: pulmonay consult (per Melanie Polio, NP-- c/o sob with exertion, prod cough with white mucus occ mixed with bright red blood.)     Outpatient Encounter Medications as of 06/12/2020  Medication Sig Note   Ascorbic Acid  (VITAMIN C ) 1000 MG tablet Take 1,000 mg by mouth daily.     calcium  carbonate (OS-CAL) 600  MG TABS tablet Take 600 mg by mouth daily.     Cholecalciferol  (VITAMIN D3) 25 MCG (1000 UT) CHEW Chew 1,000 Units by mouth daily.     CINNAMON  PO Take 1,000 mg by mouth 2 (two) times daily.     cyclobenzaprine  (FLEXERIL ) 10 MG tablet Take 1 tablet (10 mg total) by mouth 3 (three) times daily as needed for muscle spasms.    diphenhydrAMINE  (BENADRYL ) 25 mg capsule Take 50 mg by mouth in the morning, at noon, and at bedtime. 03/05/2022: Takes 2 tablets TID for as long as he can remember. States he is allergic to everything   Ginseng 100 MG CAPS Take 100 mg by mouth daily.     NARCAN  4 MG/0.1ML LIQD nasal spray kit Place 0.4 mg into the nose as needed (opioid overdose).     Omega-3 1000 MG CAPS Take 1,000 mg by mouth daily.     vitamin A  10000 UNIT capsule Take 10,000 Units by mouth daily.    vitamin B-12 (CYANOCOBALAMIN ) 1000 MCG tablet Take 1,000 mcg by mouth daily.    Vitamin E  400 units TABS Take 400 Units by mouth daily.     [DISCONTINUED] albuterol  (VENTOLIN  HFA) 108 (90 Base) MCG/ACT inhaler Inhale 2 puffs into the lungs every 6 (six) hours as needed for wheezing or shortness of breath.    [DISCONTINUED] aspirin  EC 81 MG EC tablet Take 1 tablet (81 mg total) by mouth daily.    [  DISCONTINUED] atorvastatin  (LIPITOR ) 80 MG tablet Take 1 tablet (80 mg total) by mouth daily. (Patient taking differently: Take 80 mg by mouth at bedtime.)    [DISCONTINUED] benazepril  (LOTENSIN ) 20 MG tablet Take 20 mg by mouth daily.     [DISCONTINUED] clopidogrel  (PLAVIX ) 75 MG tablet Take 1 tablet (75 mg total) by mouth daily with breakfast.    [DISCONTINUED] diclofenac  sodium (VOLTAREN ) 1 % GEL Apply 2 g topically 4 (four) times daily.    [DISCONTINUED] doxepin  (SINEQUAN ) 25 MG capsule Take 50 mg by mouth at bedtime.     [DISCONTINUED] DULoxetine  (CYMBALTA ) 30 MG capsule Take 30 mg by mouth 2 (two) times daily.     [DISCONTINUED] DULoxetine  (CYMBALTA ) 60 MG capsule Take 1 capsule (60 mg total) by mouth daily.  (Patient taking differently: Take 60 mg by mouth at bedtime. )    [DISCONTINUED] EPINEPHrine  0.3 mg/0.3 mL IJ SOAJ injection Inject 0.3 mLs (0.3 mg total) into the muscle as needed for anaphylaxis. (Patient not taking: Reported on 03/05/2021)    [DISCONTINUED] Ginger , Zingiber officinalis, (GINGER  PO) Take 1 tablet by mouth daily.    [DISCONTINUED] insulin  degludec (TRESIBA  FLEXTOUCH) 100 UNIT/ML FlexTouch Pen Inject 0.28 mLs (28 Units total) into the skin daily.    [DISCONTINUED] insulin  lispro (HUMALOG  KWIKPEN) 100 UNIT/ML KwikPen Inject 0.16 mLs (16 Units total) into the skin 2 (two) times daily with breakfast and lunch AND 0.18 mLs (18 Units total) daily with supper.    [DISCONTINUED] isosorbide  mononitrate (IMDUR ) 60 MG 24 hr tablet Take 1.5 tablets (90 mg total) by mouth daily.    [DISCONTINUED] Multiple Vitamins-Minerals (HAIR SKIN AND NAILS FORMULA PO) Take 1 capsule by mouth daily.  (Patient not taking: No sig reported)    [DISCONTINUED] mupirocin  ointment (BACTROBAN ) 2 % Apply 1 application topically daily as needed. (Patient not taking: Reported on 03/05/2022)    [DISCONTINUED] oxyCODONE  (ROXICODONE ) 15 MG immediate release tablet TAKE 1/2 TO 1 (ONE HALF TO ONE) TABLET BY MOUTH FOUR TO SIX TIMES DAILY IF TOLERATED NOTE TABLET IS 15 MG    [DISCONTINUED] pantoprazole  (PROTONIX ) 40 MG tablet Take 1 tablet by mouth twice daily    [DISCONTINUED] pregabalin  (LYRICA ) 50 MG capsule TAKE 1 CAPSULE BY MOUTH THREE TIMES DAILY    [DISCONTINUED] Semaglutide ,0.25 or 0.5MG /DOS, (OZEMPIC , 0.25 OR 0.5 MG/DOSE,) 2 MG/1.5ML SOPN Inject 0.5 mg into the skin every Friday.     [DISCONTINUED] sucralfate  (CARAFATE ) 1 g tablet TAKE 1 TABLET BY MOUTH 4 TIMES DAILY... WITH MEALS AND AT BEDTIME    [DISCONTINUED] Tiotropium Bromide-Olodaterol (STIOLTO RESPIMAT ) 2.5-2.5 MCG/ACT AERS Inhale 2 puffs into the lungs daily.    [DISCONTINUED] traZODone  (DESYREL ) 100 MG tablet Take 1 tablet (100 mg total) by mouth at bedtime as  needed for sleep.    [DISCONTINUED] triamcinolone  cream (KENALOG ) 0.1 % Apply 1 application topically 2 (two) times daily. (Patient not taking: Reported on 08/07/2021)    [EXPIRED] Budeson-Glycopyrrol-Formoterol  (BREZTRI  AEROSPHERE) 160-9-4.8 MCG/ACT AERO Inhale 2 puffs into the lungs in the morning and at bedtime for 1 day.    [DISCONTINUED] cephALEXin  (KEFLEX ) 500 MG capsule Take 500 mg by mouth 2 (two) times daily. (Patient not taking: Reported on 06/12/2020)    [DISCONTINUED] nitroGLYCERIN  (NITROSTAT ) 0.4 MG SL tablet Place 1 tablet (0.4 mg total) under the tongue every 5 (five) minutes as needed for chest pain.    No facility-administered encounter medications on file as of 06/12/2020.      Objective:   Vitals:   06/12/20 1319  BP: 122/68  Pulse: 99  Temp: (!) 97.3 F (36.3 C)  Height: 5' 8 (1.727 m)  Weight: 247 lb (112 kg)  SpO2: 96%  TempSrc: Temporal  BMI (Calculated): 37.56     Physical exam documentation is limited by delayed entry of information.

## 2020-06-12 NOTE — Patient Instructions (Signed)
We are getting a blood test to check for certain hereditary forms of emphysema.  We are going to get some breathing tests.  We are going to get an overnight oxygen test on your CPAP to make sure you are getting off oxygen.  We are giving you a trial of Breztri 2 puffs twice a day.  Make sure you rinse your mouth well after you use it.  You may put a little baking soda in the water to rinse with.  You may use your Ventolin (albuterol, blue inhaler) times a day ONLY IF NEEDED as this is your rescue inhaler.  DO NOT USE ANY OTHER INHALERS WHILE USING BREZTRI (E.G. INCRUSE, SPIRIVA, BREO).  Please talk to Dr. Verl Blalock about your reflux symptoms.  I recommend weight loss as weight is likely affecting her breathing.  We will see you in follow-up in 2 to 3 months time call sooner should any new problems arise.

## 2020-06-14 ENCOUNTER — Other Ambulatory Visit: Payer: Self-pay

## 2020-06-14 ENCOUNTER — Other Ambulatory Visit
Admission: RE | Admit: 2020-06-14 | Discharge: 2020-06-14 | Disposition: A | Discharge status: Home / Self Care | Payer: PPO | Source: Ambulatory Visit | Attending: Gastroenterology | Admitting: Gastroenterology

## 2020-06-14 DIAGNOSIS — Z01812 Encounter for preprocedural laboratory examination: Secondary | ICD-10-CM | POA: Insufficient documentation

## 2020-06-14 DIAGNOSIS — Z20822 Contact with and (suspected) exposure to covid-19: Secondary | ICD-10-CM | POA: Insufficient documentation

## 2020-06-14 LAB — SARS CORONAVIRUS 2 (TAT 6-24 HRS): SARS Coronavirus 2: NEGATIVE

## 2020-06-17 LAB — ALPHA-1 ANTITRYPSIN PHENOTYPE: A-1 Antitrypsin, Ser: 134 mg/dL (ref 101–187)

## 2020-06-18 ENCOUNTER — Ambulatory Visit: Payer: PPO | Admitting: Registered Nurse

## 2020-06-18 ENCOUNTER — Other Ambulatory Visit: Payer: Self-pay

## 2020-06-18 ENCOUNTER — Encounter
Admission: RE | Disposition: A | Discharge status: Home / Self Care | Payer: Self-pay | Source: Home / Self Care | Attending: Gastroenterology

## 2020-06-18 ENCOUNTER — Ambulatory Visit
Admission: RE | Admit: 2020-06-18 | Discharge: 2020-06-18 | Disposition: A | Discharge status: Home / Self Care | Payer: PPO | Attending: Gastroenterology | Admitting: Gastroenterology

## 2020-06-18 ENCOUNTER — Encounter: Payer: Self-pay | Admitting: Gastroenterology

## 2020-06-18 DIAGNOSIS — G4730 Sleep apnea, unspecified: Secondary | ICD-10-CM | POA: Insufficient documentation

## 2020-06-18 DIAGNOSIS — E785 Hyperlipidemia, unspecified: Secondary | ICD-10-CM | POA: Diagnosis not present

## 2020-06-18 DIAGNOSIS — Z794 Long term (current) use of insulin: Secondary | ICD-10-CM | POA: Insufficient documentation

## 2020-06-18 DIAGNOSIS — K219 Gastro-esophageal reflux disease without esophagitis: Secondary | ICD-10-CM | POA: Insufficient documentation

## 2020-06-18 DIAGNOSIS — J449 Chronic obstructive pulmonary disease, unspecified: Secondary | ICD-10-CM | POA: Insufficient documentation

## 2020-06-18 DIAGNOSIS — E119 Type 2 diabetes mellitus without complications: Secondary | ICD-10-CM | POA: Insufficient documentation

## 2020-06-18 DIAGNOSIS — Z800 Family history of malignant neoplasm of digestive organs: Secondary | ICD-10-CM

## 2020-06-18 DIAGNOSIS — I10 Essential (primary) hypertension: Secondary | ICD-10-CM | POA: Insufficient documentation

## 2020-06-18 DIAGNOSIS — Z8505 Personal history of malignant neoplasm of liver: Secondary | ICD-10-CM | POA: Diagnosis not present

## 2020-06-18 DIAGNOSIS — Z7902 Long term (current) use of antithrombotics/antiplatelets: Secondary | ICD-10-CM | POA: Insufficient documentation

## 2020-06-18 DIAGNOSIS — Z955 Presence of coronary angioplasty implant and graft: Secondary | ICD-10-CM | POA: Diagnosis not present

## 2020-06-18 DIAGNOSIS — I2510 Atherosclerotic heart disease of native coronary artery without angina pectoris: Secondary | ICD-10-CM | POA: Insufficient documentation

## 2020-06-18 DIAGNOSIS — I252 Old myocardial infarction: Secondary | ICD-10-CM | POA: Diagnosis not present

## 2020-06-18 DIAGNOSIS — K640 First degree hemorrhoids: Secondary | ICD-10-CM | POA: Insufficient documentation

## 2020-06-18 DIAGNOSIS — Z87891 Personal history of nicotine dependence: Secondary | ICD-10-CM | POA: Insufficient documentation

## 2020-06-18 DIAGNOSIS — Z79899 Other long term (current) drug therapy: Secondary | ICD-10-CM | POA: Insufficient documentation

## 2020-06-18 DIAGNOSIS — E1165 Type 2 diabetes mellitus with hyperglycemia: Secondary | ICD-10-CM | POA: Diagnosis not present

## 2020-06-18 DIAGNOSIS — Z7982 Long term (current) use of aspirin: Secondary | ICD-10-CM | POA: Insufficient documentation

## 2020-06-18 DIAGNOSIS — Z8673 Personal history of transient ischemic attack (TIA), and cerebral infarction without residual deficits: Secondary | ICD-10-CM | POA: Diagnosis not present

## 2020-06-18 DIAGNOSIS — F329 Major depressive disorder, single episode, unspecified: Secondary | ICD-10-CM | POA: Diagnosis not present

## 2020-06-18 DIAGNOSIS — G8929 Other chronic pain: Secondary | ICD-10-CM | POA: Diagnosis not present

## 2020-06-18 DIAGNOSIS — Z1211 Encounter for screening for malignant neoplasm of colon: Secondary | ICD-10-CM

## 2020-06-18 HISTORY — DX: Unspecified convulsions: R56.9

## 2020-06-18 HISTORY — PX: COLONOSCOPY WITH PROPOFOL: SHX5780

## 2020-06-18 HISTORY — DX: Sleep apnea, unspecified: G47.30

## 2020-06-18 HISTORY — DX: Fatty (change of) liver, not elsewhere classified: K76.0

## 2020-06-18 LAB — GLUCOSE, CAPILLARY: Glucose-Capillary: 252 mg/dL — ABNORMAL HIGH (ref 70–99)

## 2020-06-18 SURGERY — COLONOSCOPY WITH PROPOFOL
Anesthesia: General

## 2020-06-18 MED ORDER — SODIUM CHLORIDE 0.9 % IV SOLN: INTRAVENOUS | Status: DC

## 2020-06-18 MED ORDER — PROPOFOL 10 MG/ML IV BOLUS
INTRAVENOUS | Status: DC | PRN
  Administered 2020-06-18: 80 mg via INTRAVENOUS

## 2020-06-18 MED ORDER — LIDOCAINE HCL (CARDIAC) PF 100 MG/5ML IV SOSY
PREFILLED_SYRINGE | INTRAVENOUS | Status: DC | PRN
  Administered 2020-06-18: 100 mg via INTRAVENOUS

## 2020-06-18 MED ORDER — PROPOFOL 500 MG/50ML IV EMUL
INTRAVENOUS | Status: DC | PRN
  Administered 2020-06-18: 150 ug/kg/min via INTRAVENOUS

## 2020-06-18 NOTE — Progress Notes (Signed)
   06/18/20 0750  Clinical Encounter Type  Visited With Family  Visit Type Initial  Referral From Chaplain  Consult/Referral To Chaplain  while rounding [facility] waiting area, chaplain spoke with patient's wife. She said all was well and chaplain left.

## 2020-06-18 NOTE — Op Note (Signed)
[facility] Gastroenterology Patient Name: Edward Schneider Procedure Date: 06/18/2020 8:29 AM MRN: [id number] Account #: [id number] Date of Birth: 26-Feb-1956 Admit Type: Outpatient Age: 64 Room: [facility] ENDO ROOM 4 Gender: Male Note Status: Finalized Procedure:             Colonoscopy Indications:           Family history of colon cancer in a first-degree                         relative before age 17 years Providers:             Lucilla Lame MD, MD Referring MD:          Franchot Gallo (Referring MD) Medicines:             Propofol per Anesthesia Complications:         No immediate complications. Procedure:             Pre-Anesthesia Assessment:                        - Prior to the procedure, a History and Physical was                         performed, and patient medications and allergies were                         reviewed. The patient's tolerance of previous                         anesthesia was also reviewed. The risks and benefits                         of the procedure and the sedation options and risks                         were discussed with the patient. All questions were                         answered, and informed consent was obtained. Prior                         Anticoagulants: The patient has taken no previous                         anticoagulant or antiplatelet agents. ASA Grade                         Assessment: II - A patient with mild systemic disease.                         After reviewing the risks and benefits, the patient                         was deemed in satisfactory condition to undergo the                         procedure.  After obtaining informed consent, the colonoscope was                         passed under direct vision. Throughout the procedure,                         the patient's blood pressure, pulse, and oxygen                         saturations were monitored continuously. The                          Colonoscope was introduced through the anus and                         advanced to the the cecum, identified by appendiceal                         orifice and ileocecal valve. The colonoscopy was                         performed without difficulty. The patient tolerated                         the procedure well. The quality of the bowel                         preparation was poor. Findings:      The perianal and digital rectal examinations were normal.      A large amount of semi-liquid stool was found in the entire colon,       making visualization difficult.      Non-bleeding internal hemorrhoids were found during retroflexion. The       hemorrhoids were Grade I (internal hemorrhoids that do not prolapse). Impression:            - Preparation of the colon was poor.                        - Stool in the entire examined colon.                        - Non-bleeding internal hemorrhoids.                        - No specimens collected. Recommendation:        - Discharge patient to home.                        - Resume previous diet.                        - Continue present medications.                        - Repeat colonoscopy in 5 years for surveillance. Procedure Code(s):     --- Professional ---                        [id number], Colonoscopy, flexible; diagnostic, including  collection of specimen(s) by brushing or washing, when                         performed (separate procedure) Diagnosis Code(s):     --- Professional ---                        Z80.0, Family history of malignant neoplasm of                         digestive organs CPT copyright 2019 American Medical Association. All rights reserved. The codes documented in this report are preliminary and upon coder review may  be revised to meet current compliance requirements. Lucilla Lame MD, MD 06/18/2020 8:57:37 AM This report has been signed electronically. Number of Addenda: 0 Note  Initiated On: 06/18/2020 8:29 AM Scope Withdrawal Time: 0 hours 10 minutes 58 seconds  Total Procedure Duration: 0 hours 17 minutes 38 seconds  Estimated Blood Loss:  Estimated blood loss: none.      [facility]
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
## 2020-06-18 NOTE — Transfer of Care (Signed)
Immediate Anesthesia Transfer of Care Note  Patient: Edward Schneider.  Procedure(s) Performed: COLONOSCOPY WITH PROPOFOL (N/A )  Patient Location: PACU and Endoscopy Unit  Anesthesia Type:General  Level of Consciousness: drowsy  Airway & Oxygen Therapy: Patient Spontanous Breathing  Post-op Assessment: Report given to RN and Post -op Vital signs reviewed and stable  Post vital signs: Reviewed and stable  Last Vitals:  Vitals Value Taken Time  BP 109/62 06/18/20 0859  Temp    Pulse 93 06/18/20 0900  Resp 11 06/18/20 0900  SpO2 97 % 06/18/20 0900  Vitals shown include unvalidated device data.  Last Pain:  Vitals:   06/18/20 0756  TempSrc: Oral  PainSc: 3          Complications: No complications documented.

## 2020-06-18 NOTE — Anesthesia Postprocedure Evaluation (Signed)
Anesthesia Post Note  Patient: Edward Schneider.  Procedure(s) Performed: COLONOSCOPY WITH PROPOFOL (N/A )  Patient location during evaluation: Endoscopy Anesthesia Type: General Level of consciousness: awake and alert and oriented Pain management: pain level controlled Vital Signs Assessment: post-procedure vital signs reviewed and stable Respiratory status: spontaneous breathing, nonlabored ventilation and respiratory function stable Cardiovascular status: blood pressure returned to baseline and stable Postop Assessment: no signs of nausea or vomiting Anesthetic complications: no   No complications documented.   Last Vitals:  Vitals:   06/18/20 0930 06/18/20 0940  BP: (!) 102/57 113/70  Pulse: 66 68  Resp: 13 18  Temp:    SpO2: 97% 98%    Last Pain:  Vitals:   06/18/20 0940  TempSrc:   PainSc: 0-No pain                 Estefania Kamiya
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
## 2020-06-19 ENCOUNTER — Encounter: Payer: Self-pay | Admitting: Gastroenterology

## 2020-06-19 ENCOUNTER — Encounter: Payer: PPO | Attending: Internal Medicine

## 2020-06-19 ENCOUNTER — Telehealth: Payer: Self-pay

## 2020-06-19 DIAGNOSIS — Z7902 Long term (current) use of antithrombotics/antiplatelets: Secondary | ICD-10-CM | POA: Insufficient documentation

## 2020-06-19 DIAGNOSIS — Z87891 Personal history of nicotine dependence: Secondary | ICD-10-CM | POA: Insufficient documentation

## 2020-06-19 DIAGNOSIS — Z79899 Other long term (current) drug therapy: Secondary | ICD-10-CM | POA: Insufficient documentation

## 2020-06-19 DIAGNOSIS — M5030 Other cervical disc degeneration, unspecified cervical region: Secondary | ICD-10-CM | POA: Insufficient documentation

## 2020-06-19 DIAGNOSIS — I208 Other forms of angina pectoris: Secondary | ICD-10-CM | POA: Insufficient documentation

## 2020-06-19 DIAGNOSIS — Z794 Long term (current) use of insulin: Secondary | ICD-10-CM | POA: Insufficient documentation

## 2020-06-19 DIAGNOSIS — I10 Essential (primary) hypertension: Secondary | ICD-10-CM | POA: Insufficient documentation

## 2020-06-19 DIAGNOSIS — K219 Gastro-esophageal reflux disease without esophagitis: Secondary | ICD-10-CM | POA: Insufficient documentation

## 2020-06-19 DIAGNOSIS — Z7982 Long term (current) use of aspirin: Secondary | ICD-10-CM | POA: Insufficient documentation

## 2020-06-19 DIAGNOSIS — E119 Type 2 diabetes mellitus without complications: Secondary | ICD-10-CM | POA: Insufficient documentation

## 2020-06-19 DIAGNOSIS — Z8673 Personal history of transient ischemic attack (TIA), and cerebral infarction without residual deficits: Secondary | ICD-10-CM | POA: Insufficient documentation

## 2020-06-19 DIAGNOSIS — E785 Hyperlipidemia, unspecified: Secondary | ICD-10-CM | POA: Insufficient documentation

## 2020-06-19 DIAGNOSIS — M5136 Other intervertebral disc degeneration, lumbar region: Secondary | ICD-10-CM | POA: Insufficient documentation

## 2020-06-19 DIAGNOSIS — I252 Old myocardial infarction: Secondary | ICD-10-CM | POA: Insufficient documentation

## 2020-06-19 NOTE — Telephone Encounter (Signed)
Patient calling to find out if his patient assistance has been ordered for Humalog,Tresiba,Ozempic and pen needles-patient would like a return call once this has been done he is worried about running out

## 2020-06-19 NOTE — Telephone Encounter (Signed)
According to documentation on 05/24/20, a refill form was received and signed for Novolog.  However, a new form was printed, filled out, and signed for all 3 medications for pt to receive them from [facility].  Once faxed successful, pt will receive a phone call.

## 2020-06-20 ENCOUNTER — Other Ambulatory Visit: Payer: Self-pay

## 2020-06-20 MED ORDER — TRAZODONE HCL 100 MG PO TABS: 100.0000 mg | ORAL_TABLET | Freq: Every evening | ORAL | 4 refills | Status: DC | PRN

## 2020-06-20 NOTE — Telephone Encounter (Signed)
Patient last seen 05/23/20 and has appointment 08/23/20

## 2020-06-21 NOTE — Telephone Encounter (Signed)
Reorder form has been faxed to [employer] [facility].

## 2020-06-21 NOTE — Telephone Encounter (Signed)
FAXED [place]: [employer] [facility]  Document: refill form for [employer], Tresiba, and Ozempic Other records requested: NA Faxed twice and twice, a confirmation was received.  All above requested information has been faxed successfully to [employer] listed above. Documents and fax confirmation have been placed in the faxed file for future reference.

## 2020-06-21 NOTE — Telephone Encounter (Signed)
Called pt and left detailed voicemail informing him that the refill form has been sent to [facility]. Also informed pt that if he has any questions regarding the status of his application, he should direct all questions directly to the program, and left their phone number for him as well.

## 2020-06-25 ENCOUNTER — Encounter: Payer: Self-pay | Admitting: *Deleted

## 2020-06-25 DIAGNOSIS — I208 Other forms of angina pectoris: Secondary | ICD-10-CM

## 2020-06-26 ENCOUNTER — Encounter: Payer: Self-pay | Admitting: Pulmonary Disease

## 2020-06-26 ENCOUNTER — Encounter: Payer: Self-pay | Admitting: *Deleted

## 2020-06-26 DIAGNOSIS — I208 Other forms of angina pectoris: Secondary | ICD-10-CM

## 2020-06-26 NOTE — Progress Notes (Signed)
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 models provide basic cardiac anatomy and physiology, with the coronary electrical and arterial systems. Review of Valvular disease and Heart Failure   Cardiac Procedures: - Group verbal and written  instruction to review commonly prescribed medications for heart disease. Reviews the medication, class of the drug, and side effects. Includes the steps to properly store meds and maintain the prescription regimen. (beta blockers and nitrates)   Cardiac Medications I: - Group verbal and written instruction to review commonly prescribed medications for heart disease. Reviews the medication, class of the drug, and side effects. Includes the steps to properly store meds and maintain the prescription regimen.   Cardiac Medications II: -Group verbal and written instruction to review commonly prescribed medications for heart disease. Reviews the medication, class of  the drug, and side effects. (all other drug classes)    Go Sex-Intimacy & Heart Disease, Get SMART - Goal Setting: - Group verbal and written instruction through game format to discuss heart disease and the return to sexual intimacy. Provides group verbal and written material to discuss and apply goal setting through the application of the S.M.A.R.T. Method.   Other Matters of the Heart: - Provides group verbal, written materials and models to describe Stable Angina and Peripheral Artery. Includes description of the disease process and treatment options available to the cardiac patient.   Infection Prevention: - Provides verbal and written material to individual with discussion of infection control including proper hand washing and proper equipment cleaning during exercise session.   Cardiac Rehab from 05/24/2020 in [facility] Cardiac and Pulmonary Rehab  Date 05/21/20  Educator AS  Instruction Review Code 1- Verbalizes Understanding      Falls Prevention: - Provides verbal and written material to individual with discussion of falls prevention and safety.   Cardiac Rehab from 05/24/2020 in [facility] Cardiac and Pulmonary Rehab  Date 05/21/20  Educator AS  Instruction Review Code 1- Verbalizes Understanding      Other: -Provides group and verbal instruction on various topics (see comments)   Knowledge Questionnaire Score:  Knowledge Questionnaire Score - 05/21/20 1517      Knowledge Questionnaire Score   Pre Score 22/26 nutirtion/angina           Core Components/Risk Factors/Patient Goals at Admission:  Personal Goals and Risk Factors at Admission - 05/21/20 1518      Core Components/Risk Factors/Patient Goals on Admission    Weight Management Yes    Intervention Weight Management/Obesity: Establish reasonable short term and long term weight goals.    Admit Weight 250 lb (113.4 kg)    Goal Weight: Short Term 245 lb (111.1 kg)    Goal Weight: Long Term 240 lb (108.9 kg)     Expected Outcomes Short Term: Continue to assess and modify interventions until short term weight is achieved;Long Term: Adherence to nutrition and physical activity/exercise program aimed toward attainment of established weight goal;Understanding recommendations for meals to include 15-35% energy as protein, 25-35% energy from fat, 35-60% energy from carbohydrates, less than 257m of dietary cholesterol, 20-35 gm of total fiber daily;Weight Loss: Understanding of general recommendations for a balanced deficit meal plan, which promotes 1-2 lb weight loss per week and includes a negative energy balance of [phone] kcal/d    Diabetes Yes    Intervention Provide education about signs/symptoms and action to take for hypo/hyperglycemia.;Provide education about proper nutrition, including hydration, and aerobic/resistive exercise prescription along with prescribed medications to achieve blood glucose in normal ranges: Fasting glucose 65-99 mg/dL    Expected Outcomes Short Term: Participant verbalizes understanding of the signs/symptoms and immediate care of hyper/hypoglycemia, proper foot care and importance of medication, aerobic/resistive exercise and nutrition plan for blood glucose control.;Long Term: Attainment  of HbA1C < 7%.    Hypertension Yes    Intervention Provide education on lifestyle modifcations including regular physical activity/exercise, weight management, moderate sodium restriction and increased consumption of fresh fruit, vegetables, and low fat dairy, alcohol moderation, and smoking cessation.;Monitor prescription use compliance.    Expected Outcomes Short Term: Continued assessment and intervention until BP is < 140/3m HG in hypertensive participants. < 130/878mHG in hypertensive participants with diabetes, heart failure or chronic kidney disease.;Long Term: Maintenance of blood pressure at goal levels.    Lipids Yes    Intervention Provide education and support for participant on  nutrition & aerobic/resistive exercise along with prescribed medications to achieve LDL <7065mHDL >28m25m  Expected Outcomes Short Term: Participant states understanding of desired cholesterol values and is compliant with medications prescribed. Participant is following exercise prescription and nutrition guidelines.;Long Term: Cholesterol controlled with medications as prescribed, with individualized exercise RX and with personalized nutrition plan. Value goals: LDL < 70mg72mL > 40 mg.           Education:Diabetes - Individual verbal and written instruction to review signs/symptoms of diabetes, desired ranges of glucose level fasting, after meals and with exercise. Acknowledge that pre and post exercise glucose checks will be done for 3 sessions at entry of program.   [facility]  Date 05/24/20  Educator SB  Instruction Review Code 1- Verba[employer] States Steel Corporationrstanding      Education: Know Your Numbers and Risk Factors: -Group verbal and written instruction about important numbers in your health.  Discussion of what are risk factors and how they play a role in the disease process.  Review of Cholesterol, Blood Pressure, Diabetes, and BMI and the role they play in your overall health.   Core Components/Risk Factors/Patient Goals Review:    Core Components/Risk Factors/Patient Goals at Discharge (Final Review):    ITP Comments:  ITP Comments    Row Name 05/17/20 1336 05/21/20 1525 05/24/20 0826 05/29/20 0529 05/30/20 1013   ITP Comments Initial telephone orientation completed. Diagnosis can be found in 7/20. EP orientation 8/3 at 2pm Completed 6MWT and gym orientation. Initial ITP created and sent for review to Dr. Mark Emily Filbertical Director. First full day of exercise!  Patient was oriented to gym and equipment including functions, settings, policies, and procedures.  Patient's individual exercise prescription and treatment plan were  reviewed.  All starting workloads were established based on the results of the 6 minute walk test done at initial orientation visit.  The plan for exercise progression was also introduced and progression will be customized based on patient's performance and goals. 30 Day review completed. Medical Director ITP review done, changes made as directed, and signed approval by Medical Director.  New to program Patient called, reported that he will be discharged from the hospital today. His next day to come back to Cardiac Rehab will be next Wednesday, 8/18. Patient stated he will bring a clearance letter.   Row NLamboglia 06/11/20 1521 06/25/20 1517 06/26/20 1152       ITP Comments Patient has not attended since last review.  We have not yet recieved clearance for him to return. LarryTamitill out since 8/6.  He had a colonoscopy on 06/18/20. He has a pulmonary follow up on 9/10.  We will see what they say about resuming rehab.  Still have not recieved clearance from Dr. End fSaunders Revelhim to return. 30 day review completed. ITP sent to Dr.  Emily Filbert, Medical Director of Cardiac and Pulmonary Rehab. Continue with ITP unless changes are made by physician.            Comments: 30 day review

## 2020-06-28 ENCOUNTER — Ambulatory Visit: Payer: PPO | Admitting: Pulmonary Disease

## 2020-06-28 ENCOUNTER — Other Ambulatory Visit: Payer: Self-pay

## 2020-06-28 ENCOUNTER — Encounter: Payer: Self-pay | Admitting: Pulmonary Disease

## 2020-06-28 VITALS — BP 138/82 | HR 68 | Temp 97.0°F | Ht 68.0 in | Wt 246.8 lb

## 2020-06-28 DIAGNOSIS — R0902 Hypoxemia: Secondary | ICD-10-CM

## 2020-06-28 MED ORDER — ALBUTEROL SULFATE HFA 108 (90 BASE) MCG/ACT IN AERS: 2.0000 | INHALATION_SPRAY | Freq: Four times a day (QID) | RESPIRATORY_TRACT | 3 refills | Status: DC | PRN

## 2020-06-28 MED ORDER — STIOLTO RESPIMAT 2.5-2.5 MCG/ACT IN AERS
2.0000 | INHALATION_SPRAY | Freq: Every day | RESPIRATORY_TRACT | 3 refills | Status: DC
Start: 2020-06-28 — End: 2020-12-24

## 2020-06-28 MED ORDER — STIOLTO RESPIMAT 2.5-2.5 MCG/ACT IN AERS
2.0000 | INHALATION_SPRAY | Freq: Every day | RESPIRATORY_TRACT | 3 refills | Status: DC
Start: 2020-06-28 — End: 2020-06-28
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
## 2020-06-28 NOTE — Patient Instructions (Addendum)
Continue Stiolto-prescription sent to pharmacy Continue albuterol-prescription sent to pharmacy  Regular exercises, graded exercise as tolerated  Obtain breathing study  Follow-up in 3 months

## 2020-07-01 ENCOUNTER — Ambulatory Visit: Payer: PPO | Admitting: Nurse Practitioner

## 2020-07-01 ENCOUNTER — Ambulatory Visit (INDEPENDENT_AMBULATORY_CARE_PROVIDER_SITE_OTHER): Payer: PPO | Admitting: Internal Medicine

## 2020-07-01 ENCOUNTER — Telehealth: Payer: Self-pay

## 2020-07-01 VITALS — BP 108/79 | HR 102 | Ht 68.0 in | Wt 247.0 lb

## 2020-07-01 DIAGNOSIS — Z9989 Dependence on other enabling machines and devices: Secondary | ICD-10-CM | POA: Diagnosis not present

## 2020-07-01 DIAGNOSIS — G43009 Migraine without aura, not intractable, without status migrainosus: Secondary | ICD-10-CM

## 2020-07-01 DIAGNOSIS — G4733 Obstructive sleep apnea (adult) (pediatric): Secondary | ICD-10-CM

## 2020-07-01 DIAGNOSIS — I10 Essential (primary) hypertension: Secondary | ICD-10-CM | POA: Diagnosis not present

## 2020-07-01 DIAGNOSIS — J449 Chronic obstructive pulmonary disease, unspecified: Secondary | ICD-10-CM | POA: Diagnosis not present

## 2020-07-01 NOTE — Chronic Care Management (AMB) (Signed)
Care Management   Note  07/01/2020 Name: Edward Schneider. MRN: [id number] DOB: [birth year]  Edward Schneider. is a 64 y.o. year old male who is a primary care patient of Dossie Arbour, Redge Gainer, MD and is actively engaged with the care management team. I reached out to Edward Schneider. by phone today to assist with scheduling a follow up visit with the Pharmacist  Follow up plan: Unsuccessful telephone outreach attempt made. A HIPPA compliant phone message was left for the patient providing contact information and requesting a return call.  The care management team will reach out to the patient again over the next 7 days.  If patient returns call to provider office, please advise to call Embedded Care Management Care Guide Penne Lash  at [phone]  Penne Lash, RMA Care Guide, Embedded Care Coordination [facility]  [place], [place] [address] Direct Dial: [phone] [email or web address]

## 2020-07-01 NOTE — Progress Notes (Signed)
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 DETECTED    Comment: (NOTE) Tricyclics + metabolites, urine    Cutoff 1000 ng/mL Amphetamines + metabolites, urine  Cutoff 1000 ng/mL MDMA (Ecstasy), urine              Cutoff 500 ng/mL Cocaine Metabolite, urine          Cutoff 300 ng/mL Opiate + metabolites, urine        Cutoff 300 ng/mL Phencyclidine (PCP), urine         Cutoff 25 ng/mL Cannabinoid, urine                 Cutoff 50 ng/mL Barbiturates + metabolites, urine  Cutoff 200 ng/mL Benzodiazepine, urine              Cutoff 200 ng/mL Methadone, urine                    Cutoff 300 ng/mL  The urine drug screen provides only a preliminary, unconfirmed analytical test result and should not be used for non-medical purposes. Clinical consideration and professional judgment should be applied to any positive drug screen result due to possible interfering substances. A more specific alternate chemical method must be used in order to obtain a confirmed analytical result. Gas chromatography / mass spectrometry (GC/MS) is the preferred confirm atory method. Performed at [facility], [place], [place] [address]   Troponin I (High Sensitivity)     Status: None   Collection Time: 05/29/20  8:00 PM  Result Value Ref Range   Troponin I (High Sensitivity) 5 <18 ng/L    Comment: (NOTE) Elevated high sensitivity troponin I (hsTnI) values and significant  changes across serial measurements may suggest ACS but many other  chronic and acute conditions are known to elevate hsTnI results.  Refer to the "Links" section for chest pain algorithms and additional  guidance. Performed at [facility], [address] [place], [place] [address]   Glucose, capillary     Status: Abnormal   Collection Time: 05/29/20  9:12 PM  Result Value Ref Range   Glucose-Capillary 223 (H) 70 - 99 mg/dL    Comment: Glucose reference range applies only to samples taken after fasting for at least 8 hours.  HIV Antibody (routine testing w rflx)     Status: None   Collection Time: 05/30/20  6:03 AM  Result Value Ref Range  HIV Screen 4th Generation wRfx Non Reactive Non Reactive    Comment: Performed at [facility], [place] [address] [place], [place] [address]  CBC     Status: Abnormal   Collection Time: 05/30/20  6:03 AM  Result Value Ref Range   WBC 8.7 4.0 - 10.5 K/uL   RBC 4.10 (L) 4.22 - 5.81 MIL/uL   Hemoglobin 11.2 (L) 13.0 - 17.0 g/dL   HCT 33.9 (L) 39 - 52 %   MCV 82.7 80.0 - 100.0 fL   MCH 27.3 26.0 - 34.0 pg   MCHC 33.0 30.0 - 36.0  g/dL   RDW 14.4 11.5 - 15.5 %   Platelets 168 150 - 400 K/uL   nRBC 0.0 0.0 - 0.2 %    Comment: Performed at [facility], [address], [place], [place] [address]  Basic metabolic panel     Status: Abnormal   Collection Time: 05/30/20  6:03 AM  Result Value Ref Range   Sodium 136 135 - 145 mmol/L   Potassium 4.6 3.5 - 5.1 mmol/L   Chloride 99 98 - 111 mmol/L   CO2 26 22 - 32 mmol/L   Glucose, Bld 217 (H) 70 - 99 mg/dL    Comment: Glucose reference range applies only to samples taken after fasting for at least 8 hours.   BUN 15 8 - 23 mg/dL   Creatinine, Ser 1.17 0.61 - 1.24 mg/dL   Calcium 10.0 8.9 - 10.3 mg/dL   GFR calc non Af Amer >60 >60 mL/min   GFR calc Af Amer >60 >60 mL/min   Anion gap 11 5 - 15    Comment: Performed at [facility], [address] [place], [place] [address]  Glucose, capillary     Status: Abnormal   Collection Time: 05/30/20  7:59 AM  Result Value Ref Range   Glucose-Capillary 244 (H) 70 - 99 mg/dL    Comment: Glucose reference range applies only to samples taken after fasting for at least 8 hours.  Glucose, capillary     Status: Abnormal   Collection Time: 05/30/20 11:27 AM  Result Value Ref Range   Glucose-Capillary 275 (H) 70 - 99 mg/dL    Comment: Glucose reference range applies only to samples taken after fasting for at least 8 hours.  Alpha-1 antitrypsin phenotype     Status: None   Collection Time: 06/12/20  2:15 PM  Result Value Ref Range   A-1 Antitrypsin Pheno MM     Comment: (NOTE)       Phenotype  Population     A-1-AT Concentration*                  Incidence %   % of MM (Typical Range)       MM            86.5%       100%     (96 - 189)       MS             8.0%        86%     (83 - 161)       MZ             3.9%        61%     (60 - 111)       FM             0.4%  100%     (93 - 191)       SZ             0.3%        41%     (42 -  75)       SS             0.1%        64%     (62 - 119)       ZZ              0.05%       19%     (16 -  38)       FS             0.05%       70%     (70 - 128)       FZ            Unknown      46%     (44 -  88)       FF            Unknown                Unknown *A-1-AT concentration in the homozygous MM phenotype is taken as the reference normal. Percent deficiency in each phenotype is reported relative to this reference. Ranges used to confirm phenotype. Performed At: [facility] [place], [place] [id number] Rush Farmer MD YQ:[id number]    A-1 Antitrypsin, Ser 134 101 - 187 mg/dL  SARS CORONAVIRUS 2 (TAT 6-24 HRS) Nasopharyngeal Nasopharyngeal Swab     Status: None   Collection Time: 06/14/20 10:17 AM   Specimen: Nasopharyngeal Swab  Result Value Ref Range   SARS Coronavirus 2 NEGATIVE NEGATIVE    Comment: (NOTE) SARS-CoV-2 target nucleic acids are NOT DETECTED.  The SARS-CoV-2 RNA is generally detectable in upper and lower respiratory specimens during the acute phase of infection. Negative results do not preclude SARS-CoV-2 infection, do not rule out co-infections with other pathogens, and should not be used as the sole basis for treatment or other patient management decisions. Negative results must be combined with clinical observations, patient history, and epidemiological information. The expected result is Negative.  Fact Sheet for Patients: SugarRoll.be  Fact Sheet for Healthcare Providers: https://www.woods-mathews.com/  This test is not yet approved or cleared by the [place] FDA and  has been authorized for detection and/or diagnosis of SARS-CoV-2 by FDA under an Emergency Use Authorization (EUA). This EUA will remain  in effect (meaning this test can be used) for the duration of the COVID-19 declaration under Se ction 564(b)(1) of the Act, 21 U.S.C. section 360bbb-3(b)(1), unless the authorization is terminated or revoked sooner.  Performed at [facility], [place] [address] [place], [place] [address]   Glucose, capillary     Status: Abnormal   Collection Time: 06/18/20  8:07 AM  Result Value Ref Range   Glucose-Capillary 252 (H) 70 - 99 mg/dL    Comment: Glucose reference range applies only to samples taken after fasting for at least 8 hours.    Radiology: No results found.   Assessment and Plan: Patient Active Problem List   Diagnosis Date Noted  . Family history of malignant neoplasm of gastrointestinal tract   . Shortness of breath 06/08/2020  . Hypoxia 06/08/2020  . Chest pain 05/29/2020  . Depression 05/29/2020  . CAD in native artery  05/29/2020  . Stroke ([place]) 05/29/2020  . LUQ abdominal pain 05/29/2020  . Essential hypertension   . Depression, major, single episode, moderate ([place]) 03/06/2020  . BMI 37.0-37.9, adult 03/06/2020  . Uncontrolled type 2 diabetes mellitus with hyperglycemia ([place]) 10/29/2019  . Coronary artery disease of native artery of native heart with stable angina pectoris ([place]) 08/31/2019  . Pain due to onychomycosis of toenails of both feet 08/10/2019  . Morbid obesity ([place]) 05/17/2019  . Syncope 01/14/2019  . BPH (benign prostatic hyperplasia) 04/27/2018  . Sleep apnea 02/08/2018  . GERD (gastroesophageal reflux disease) 04/04/2017  . Advanced care planning/counseling discussion 03/29/2017  . DM type 2 with diabetic peripheral neuropathy ([place]) 09/30/2016  . Skin lesions, generalized 09/24/2016  . Insomnia 12/24/2015  . Hyperlipidemia associated with type 2 diabetes mellitus ([place]) 09/23/2015  . Bilateral carotid artery stenosis 09/11/2015  . Atherosclerosis of abdominal aorta ([place]) 08/26/2015  . Migraine headache 08/21/2015  . Chronic obstructive pulmonary disease ([place]) 04/25/2015  . Hypertension associated with diabetes ([place]) 04/25/2015  . DDD (degenerative disc disease), cervical 03/27/2015  . DDD (degenerative disc disease), lumbar 03/27/2015  . Cervical post-laminectomy syndrome 03/27/2015  .  Bilateral occipital neuralgia 03/27/2015  . Pancreatic insufficiency 01/28/2015  . Chronic back pain 12/24/2014      The patient does tolerate PAP and reports significant benefit from PAP use. The patient will need a repeat titration to evaluate and treat the desaturations.The patient was also counselled on continuing his exercise program. The compliance is excellent. The apnea is very well controlled.   1. OSA- continue excellent compliance. A repeat titration study. 2. HTN-BP and HR well controlled today, continue with current therapy and follow-up with PCP. 3. Headaches improved on therapy 4. COPD medical management onalbuterol stiolto  General Counseling: I have discussed the findings of the evaluation and examination with Fritz Pickerel.  I have also discussed any further diagnostic evaluation thatmay be needed or ordered today. Joni verbalizes understanding of the findings of todays visit. We also reviewed his medications today and discussed drug interactions and side effects including but not limited excessive drowsiness and altered mental states. We also discussed that there is always a risk not just to him but also people around him. he has been encouraged to call the office with any questions or concerns that should arise related to todays visit.     This patient was seen by Theodoro Grist AGNP-C in Collaboration with Dr. Devona Konig as a part of collaborative care agreement.   I have personally obtained a history, examined the patient, evaluated laboratory and imaging results, formulated the assessment and plan and placed orders.   Richelle Ito Saunders Glance, PhD, FAASM  Diplomate, American Board of Sleep Medicine    Allyne Gee, MD [facility] Diplomate ABMS Pulmonary and Critical Care Medicine Sleep medicine

## 2020-07-01 NOTE — Patient Instructions (Signed)

## 2020-07-02 ENCOUNTER — Encounter: Payer: Self-pay | Admitting: *Deleted

## 2020-07-02 ENCOUNTER — Telehealth: Payer: Self-pay

## 2020-07-02 NOTE — Telephone Encounter (Signed)
LMOM

## 2020-07-02 NOTE — Chronic Care Management (AMB) (Signed)
Care Management   Note  07/02/2020 Name: [name]. MRN: [id number] DOB: 08-27-1956  Edward Schneider. is a 64 y.o. year old male who is a primary care patient of Dossie Arbour, Redge Gainer, MD and is actively engaged with the care management team. I reached out to Edward Schneider. by phone today to assist with scheduling a follow up visit with the Pharmacist  Follow up plan: Unsuccessful telephone outreach attempt made. A HIPPA compliant phone message was left for the patient providing contact information and requesting a return call.  The care management team will reach out to the patient again over the next 7 days.  If patient returns call to provider office, please advise to call Embedded Care Management Care Guide Penne Lash  at [phone]  Penne Lash, RMA Care Guide, Embedded Care Coordination [facility]  [place], [place] [address] Direct Dial: [phone] [email or web address] Website: [email or web address]

## 2020-07-03 ENCOUNTER — Telehealth: Payer: Self-pay | Admitting: Licensed Clinical Social Worker

## 2020-07-03 ENCOUNTER — Telehealth: Payer: Self-pay | Admitting: *Deleted

## 2020-07-03 ENCOUNTER — Telehealth: Payer: Self-pay

## 2020-07-03 NOTE — Telephone Encounter (Signed)
PA completed via CMM for Stiolto.  This has been approved.

## 2020-07-03 NOTE — Telephone Encounter (Signed)
Chronic Care Management    Clinical Social Work General Follow Up Note  07/03/2020 Name: Edward Schneider. MRN: [id number] DOB: Feb 27, 1956  Edward Schneider. is a 64 y.o. year old male who is a primary care patient of Crissman, Redge Gainer, MD. The CCM team was consulted for assistance with Mental Health Counseling and Resources.   Review of patient status, including review of consultants reports, relevant laboratory and other test results, and collaboration with appropriate care team members and the patient's provider was performed as part of comprehensive patient evaluation and provision of chronic care management services.    LCSW completed CCM outreach attempt today but was unable to reach patient successfully. A HIPPA compliant voice message was left encouraging patient to return call once available if he is still interested in CCM Social Work Services. LCSW has completed over three unsuccessful outreach attempts and will close referral at this time due to inability to establish and maintain contact with patient.   Outpatient Encounter Medications as of 07/03/2020  Medication Sig Note  . albuterol (VENTOLIN HFA) 108 (90 Base) MCG/ACT inhaler Inhale 2 puffs into the lungs every 6 (six) hours as needed for wheezing or shortness of breath.   . Ascorbic Acid (VITAMIN C) 1000 MG tablet Take 1,000 mg by mouth daily.   Marland Kitchen aspirin EC 81 MG EC tablet Take 1 tablet (81 mg total) by mouth daily.   Marland Kitchen atorvastatin (LIPITOR) 80 MG tablet Take 1 tablet (80 mg total) by mouth daily.   . benazepril (LOTENSIN) 20 MG tablet Take 20 mg by mouth daily.   . calcium carbonate (OS-CAL) 600 MG TABS tablet Take 600 mg by mouth daily.   . Cholecalciferol (VITAMIN D3) 25 MCG (1000 UT) CHEW Chew 1 capsule by mouth daily.    Marland Kitchen CINNAMON PO Take 1,000 mg by mouth 2 (two) times daily.    . clopidogrel (PLAVIX) 75 MG tablet Take 1 tablet (75 mg total) by mouth daily with breakfast.   . cyclobenzaprine (FLEXERIL) 10 MG tablet  Take 1 tablet (10 mg total) by mouth 3 (three) times daily as needed for muscle spasms.   . diclofenac sodium (VOLTAREN) 1 % GEL Apply 2 g topically 4 (four) times daily.   . diphenhydrAMINE (BENADRYL) 25 mg capsule Take 25 mg by mouth 2 (two) times daily.    Marland Kitchen doxepin (SINEQUAN) 25 MG capsule Take 50 mg by mouth at bedtime.    . DULoxetine (CYMBALTA) 30 MG capsule Take 30 mg by mouth 2 (two) times daily.    . DULoxetine (CYMBALTA) 60 MG capsule Take 1 capsule (60 mg total) by mouth daily. (Patient taking differently: Take 60 mg by mouth at bedtime. )   . EPINEPHrine 0.3 mg/0.3 mL IJ SOAJ injection Inject 0.3 mLs (0.3 mg total) into the muscle as needed for anaphylaxis.   . Ginger, Zingiber officinalis, (GINGER PO) Take 1 Dose by mouth daily.   . Ginseng 100 MG CAPS Take by mouth.   . insulin degludec (TRESIBA FLEXTOUCH) 100 UNIT/ML FlexTouch Pen Inject 0.28 mLs (28 Units total) into the skin daily.   . insulin lispro (HUMALOG KWIKPEN) 100 UNIT/ML KwikPen Inject 0.16 mLs (16 Units total) into the skin 2 (two) times daily with breakfast and lunch AND 0.18 mLs (18 Units total) daily with supper.   . isosorbide mononitrate (IMDUR) 60 MG 24 hr tablet Take 1.5 tablets (90 mg total) by mouth daily.   . Multiple Vitamins-Minerals (HAIR SKIN AND NAILS FORMULA PO) Take  1 capsule by mouth daily.    . mupirocin ointment (BACTROBAN) 2 % Apply 1 application topically daily.    Marland Kitchen NARCAN 4 MG/0.1ML LIQD nasal spray kit Place 0.4 mg into the nose once.    . nitroGLYCERIN (NITROSTAT) 0.4 MG SL tablet Place 1 tablet (0.4 mg total) under the tongue every 5 (five) minutes as needed for chest pain.   . Omega-3 1000 MG CAPS Take 1,000 mg by mouth daily.    Marland Kitchen oxyCODONE (ROXICODONE) 15 MG immediate release tablet TAKE 1/2 TO 1 (ONE HALF TO ONE) TABLET BY MOUTH FOUR TO SIX TIMES DAILY IF TOLERATED NOTE TABLET IS 15 MG (Patient taking differently: Take 7.5-15 mg by mouth every 4 (four) hours as needed for pain. TAKE 1/2 TO 1  (ONE HALF TO ONE) TABLET BY MOUTH FOUR TO SIX TIMES DAILY IF TOLERATED NOTE TABLET IS 15 MG) 09/26/2019: Taking 6 tab per day  . pantoprazole (PROTONIX) 40 MG tablet Take 1 tablet by mouth twice daily (Patient taking differently: Take 40 mg by mouth 2 (two) times daily. )   . pregabalin (LYRICA) 50 MG capsule TAKE 1 CAPSULE BY MOUTH THREE TIMES DAILY (Patient taking differently: Take 50 mg by mouth 3 (three) times daily. )   . Semaglutide,0.25 or 0.5MG /DOS, (OZEMPIC, 0.25 OR 0.5 MG/DOSE,) 2 MG/1.5ML SOPN Inject 0.5 mg into the skin once a week.   . sucralfate (CARAFATE) 1 g tablet TAKE 1 TABLET BY MOUTH 4 TIMES DAILY... WITH MEALS AND AT BEDTIME (Patient taking differently: Take 1 g by mouth 4 (four) times daily -  with meals and at bedtime. TAKE 1 TABLET BY MOUTH 4 TIMES DAILY... WITH MEALS AND AT BEDTIME)   . Tiotropium Bromide-Olodaterol (STIOLTO RESPIMAT) 2.5-2.5 MCG/ACT AERS Inhale 2 puffs into the lungs daily.   . traZODone (DESYREL) 100 MG tablet Take 1 tablet (100 mg total) by mouth at bedtime as needed for sleep.   Marland Kitchen triamcinolone cream (KENALOG) 0.1 % Apply 1 application topically 2 (two) times daily.   . vitamin A 82956 UNIT capsule Take 10,000 Units by mouth daily.   . vitamin B-12 (CYANOCOBALAMIN) 1000 MCG tablet Take 1,000 mcg by mouth daily.   . Vitamin E 400 units TABS Take 400 Units by mouth daily.     No facility-administered encounter medications on file as of 07/03/2020.    Follow Up Plan: If patient wishes to receive CCM social work services then he will contact LCSW back directly or ask PCP for a new referral to CCM program.   [name], BSW, MSW, LCSW [facility] Care Management [place]  [facility] [email or web address] Phone: [phone]

## 2020-07-08 ENCOUNTER — Telehealth: Payer: Self-pay

## 2020-07-08 NOTE — Telephone Encounter (Signed)
New message   The patient is currently out of all three medication.   Asking for a call back from the nurse to discuss.

## 2020-07-08 NOTE — Chronic Care Management (AMB) (Signed)
Care Management   Note  07/08/2020 Name: Edward Schneider. MRN: [id number] DOB: 10-25-1955  Edward Schneider. is a 64 y.o. year old male who is a primary care patient of Edward Sizer, MD and is actively engaged with the care management team. I reached out to Edward Schneider. by phone today to assist with re-scheduling a follow up visit with the Pharmacist  Follow up plan: Telephone appointment with care management team member scheduled for: 07/15/2020  Penne Lash, RMA Care Guide, Embedded Care Coordination [facility]  [place], [place] [address] Direct Dial: [phone] [email or web address] Website: [email or web address]

## 2020-07-08 NOTE — Telephone Encounter (Signed)
Spoke with patient regarding his needs.  He is in need of novolog samples and has not gotten a shipment from the patient assistance program.  Spoke with patient assistance program and they needed additional information regarding patients novolog.  Tyler Aas and ozempic have been processed as of sept 8 and will be shipped in 10-14 days.  Is sample novolog appropriate to provide patient while waiting on patient assistance program?

## 2020-07-08 NOTE — Telephone Encounter (Signed)
Novolog sample given.

## 2020-07-09 ENCOUNTER — Telehealth: Payer: Self-pay | Admitting: Pharmacist

## 2020-07-09 NOTE — Progress Notes (Signed)
Left message for patient to return my call to reschedule appointment.  Georgiana Shore ,[place] Pharmacist Assistant [phone]

## 2020-07-10 ENCOUNTER — Telehealth: Payer: Self-pay

## 2020-07-10 DIAGNOSIS — M5136 Other intervertebral disc degeneration, lumbar region: Secondary | ICD-10-CM | POA: Diagnosis not present

## 2020-07-10 DIAGNOSIS — M179 Osteoarthritis of knee, unspecified: Secondary | ICD-10-CM | POA: Diagnosis not present

## 2020-07-10 DIAGNOSIS — M545 Low back pain: Secondary | ICD-10-CM | POA: Diagnosis not present

## 2020-07-10 NOTE — Telephone Encounter (Signed)
Called to check in on Edward Schneider as he was scheduled to return to Rehab Monday and has missed his appointments this week 9/20 and 9/22. LMOM.

## 2020-07-10 NOTE — Telephone Encounter (Signed)
ONO received by Dr. Patsey Berthold- patient does not qualify for nighttime oxygen.  Left message to make patient aware of results.

## 2020-07-10 NOTE — Chronic Care Management (AMB) (Signed)
Care Management   Note  07/10/2020 Name: Edward Schneider. MRN: [id number] DOB: Sep 01, 1956  Edward Schneider. is a 64 y.o. year old male who is a primary care patient of Dossie Arbour, Redge Gainer, MD and is actively engaged with the care management team. I reached out to Edward Schneider. by phone today to assist with re-scheduling an initial visit with the Licensed Clinical Social Worker  Follow up plan: Unsuccessful telephone outreach attempt made. A HIPAA compliant phone message was left for the patient providing contact information and requesting a return call.  The care management team will reach out to the patient again over the next 7 days.  If patient returns call to provider office, please advise to call Embedded Care Management Care Guide Penne Lash  at [phone]  Penne Lash, RMA Care Guide, Embedded Care Coordination [facility]  [place], [place] [address] Direct Dial: [phone] [email or web address] Website: [email or web address]

## 2020-07-11 ENCOUNTER — Telehealth: Payer: Self-pay

## 2020-07-11 NOTE — Telephone Encounter (Signed)
Patient called- He has been out of rehab for a little while now and reported that he has been put on new medications and overall feels much better. He will be back to [facility] next Monday, 9/27.

## 2020-07-11 NOTE — Telephone Encounter (Signed)
Lm x2 for patient.   

## 2020-07-11 NOTE — Telephone Encounter (Signed)
Patient is aware of results and voiced his understanding.  Nothing further needed.  

## 2020-07-12 ENCOUNTER — Telehealth: Payer: Self-pay

## 2020-07-12 NOTE — Telephone Encounter (Signed)
Lm for patient to relay date/time of covid test prior to PFT.  [facility]

## 2020-07-12 NOTE — Telephone Encounter (Signed)
Left message x2 for patient.

## 2020-07-15 ENCOUNTER — Other Ambulatory Visit: Admission: RE | Admit: 2020-07-15 | Payer: PPO | Source: Ambulatory Visit

## 2020-07-15 ENCOUNTER — Telehealth: Payer: Self-pay | Admitting: Pharmacist

## 2020-07-15 ENCOUNTER — Telehealth: Payer: Self-pay

## 2020-07-15 NOTE — Progress Notes (Signed)
°  Chronic Care Management   Outreach Note  07/15/2020 Name: Edward Schneider. MRN: [id number] DOB: Sep 11, 1956  Referred by: Guadalupe Maple, MD Reason for referral : No chief complaint on file.   An unsuccessful telephone outreach was attempted today. The patient was referred to the pharmacist for assistance with care management and care coordination.  Left HIPAA compliant message to return my call at his convenience.  Will ask care guide to outreach for reschedule.   Junita Push. Kenton Kingfisher PharmD, [facility] [phone]

## 2020-07-15 NOTE — Progress Notes (Deleted)
  Chronic Care Management   Outreach Note  07/15/2020 Name: Edward Schneider. MRN: [id number] DOB: November 06, 1955  Referred by: Guadalupe Maple, MD Reason for referral : No chief complaint on file.   An unsuccessful telephone outreach was attempted today. The patient was referred to the pharmacist for assistance with care management and care coordination.  Left HIPAA compliant message  to return my call at his convenience.  Will ask care guide to outreach for reschedule.   Junita Push. Kenton Kingfisher PharmD, [facility] [phone]

## 2020-07-15 NOTE — Telephone Encounter (Signed)
Lm x3 for patient.  Will close encounter per office protocol.   

## 2020-07-16 ENCOUNTER — Ambulatory Visit: Payer: PPO | Admitting: Nurse Practitioner

## 2020-07-16 ENCOUNTER — Ambulatory Visit: Payer: PPO | Admitting: Family Medicine

## 2020-07-17 ENCOUNTER — Telehealth: Payer: Self-pay | Admitting: Pharmacist

## 2020-07-17 ENCOUNTER — Ambulatory Visit (INDEPENDENT_AMBULATORY_CARE_PROVIDER_SITE_OTHER): Payer: PPO | Admitting: Unknown Physician Specialty

## 2020-07-17 ENCOUNTER — Ambulatory Visit: Admission: RE | Admit: 2020-07-17 | Payer: PPO | Source: Ambulatory Visit

## 2020-07-17 ENCOUNTER — Other Ambulatory Visit: Payer: Self-pay

## 2020-07-17 ENCOUNTER — Ambulatory Visit: Admission: RE | Admit: 2020-07-17 | Payer: PPO | Source: Home / Self Care | Admitting: *Deleted

## 2020-07-17 ENCOUNTER — Ambulatory Visit
Admission: RE | Admit: 2020-07-17 | Discharge: 2020-07-17 | Disposition: A | Discharge status: Home / Self Care | Payer: PPO | Source: Ambulatory Visit | Attending: Unknown Physician Specialty | Admitting: Unknown Physician Specialty

## 2020-07-17 ENCOUNTER — Other Ambulatory Visit: Payer: Self-pay | Admitting: Unknown Physician Specialty

## 2020-07-17 ENCOUNTER — Ambulatory Visit
Admission: RE | Admit: 2020-07-17 | Discharge: 2020-07-17 | Disposition: A | Discharge status: Home / Self Care | Payer: PPO | Attending: Unknown Physician Specialty | Admitting: Unknown Physician Specialty

## 2020-07-17 ENCOUNTER — Encounter: Payer: Self-pay | Admitting: Unknown Physician Specialty

## 2020-07-17 VITALS — BP 98/65 | HR 98 | Temp 98.4°F | Ht 68.0 in | Wt 242.6 lb

## 2020-07-17 DIAGNOSIS — E1142 Type 2 diabetes mellitus with diabetic polyneuropathy: Secondary | ICD-10-CM | POA: Diagnosis not present

## 2020-07-17 DIAGNOSIS — Z23 Encounter for immunization: Secondary | ICD-10-CM | POA: Diagnosis not present

## 2020-07-17 DIAGNOSIS — L989 Disorder of the skin and subcutaneous tissue, unspecified: Secondary | ICD-10-CM

## 2020-07-17 DIAGNOSIS — E1165 Type 2 diabetes mellitus with hyperglycemia: Secondary | ICD-10-CM

## 2020-07-17 DIAGNOSIS — M1711 Unilateral primary osteoarthritis, right knee: Secondary | ICD-10-CM | POA: Diagnosis not present

## 2020-07-17 DIAGNOSIS — M25561 Pain in right knee: Secondary | ICD-10-CM

## 2020-07-17 DIAGNOSIS — M11261 Other chondrocalcinosis, right knee: Secondary | ICD-10-CM | POA: Diagnosis not present

## 2020-07-17 DIAGNOSIS — S83011A Lateral subluxation of right patella, initial encounter: Secondary | ICD-10-CM | POA: Diagnosis not present

## 2020-07-17 MED ORDER — TRESIBA FLEXTOUCH 100 UNIT/ML ~~LOC~~ SOPN
28.0000 [IU] | PEN_INJECTOR | Freq: Every day | SUBCUTANEOUS | 3 refills | Status: DC
Start: 2020-07-17 — End: 2021-02-05

## 2020-07-17 MED ORDER — CLOBETASOL PROPIONATE 0.05 % EX CREA: 1.0000 "application " | TOPICAL_CREAM | Freq: Two times a day (BID) | CUTANEOUS | 0 refills | Status: AC

## 2020-07-17 NOTE — Progress Notes (Signed)
BP 98/65 (BP Location: Left Arm, Patient Position: Sitting, Cuff Size: Large)   Pulse 98   Temp 98.4 F (36.9 C) (Oral)   Ht 5\' 8"  (1.727 m)   Wt 242 lb 9.6 oz (110 kg)   SpO2 97%   BMI 36.89 kg/m    Subjective:    Patient ID: Edward Flesher., male    DOB: October 28, 1955, 64 y.o.   MRN: [id number]  HPI: Edward Luhn. is a 64 y.o. male  Chief Complaint  Patient presents with  . Knee Pain   Right knee pain - pt states he needs a knee replacement.  Diagnosed by pain management in [place].  Has a cane and ice  Knee Pain  Incident onset: 3 months. There was no injury mechanism. The pain is present in the right knee. The quality of the pain is described as aching and stabbing. The pain is severe. The pain has been constant since onset. Associated symptoms include a loss of motion. Associated symptoms comments: Sometimes rides in a cart. The symptoms are aggravated by movement and weight bearing. Treatments tried: Percocet from Dr. Josefa Half.   Rash Pt with chronic dry skin and sores bilateral arms.  Would like someething for this.  Has seen dermatology without much help.    Relevant past medical, surgical, family and social history reviewed and updated as indicated. Interim medical history since our last visit reviewed. Allergies and medications reviewed and updated.  Review of Systems  Per HPI unless specifically indicated above     Objective:    BP 98/65 (BP Location: Left Arm, Patient Position: Sitting, Cuff Size: Large)   Pulse 98   Temp 98.4 F (36.9 C) (Oral)   Ht 5\' 8"  (1.727 m)   Wt 242 lb 9.6 oz (110 kg)   SpO2 97%   BMI 36.89 kg/m   Wt Readings from Last 3 Encounters:  07/17/20 242 lb 9.6 oz (110 kg)  07/01/20 247 lb (112 kg)  06/28/20 246 lb 12.8 oz (111.9 kg)    Physical Exam Constitutional:      General: He is not in acute distress.    Appearance: He is well-developed. He is obese.  HENT:     Head: Normocephalic and atraumatic.  Eyes:      General: Lids are normal. No scleral icterus.       Right eye: No discharge.        Left eye: No discharge.     Conjunctiva/sclera: Conjunctivae normal.  Cardiovascular:     Rate and Rhythm: Normal rate.  Pulmonary:     Effort: Pulmonary effort is normal.  Abdominal:     Palpations: There is no hepatomegaly or splenomegaly.  Musculoskeletal:     Right knee: No swelling. Decreased range of motion. Tenderness present over the medial joint line.     Comments: Unable to tolerate manipulation of right knee.    Skin:    Coloration: Skin is not pale.     Findings: No rash.     Comments: Pt with bilateral arms dryness and excoriations.    Neurological:     Mental Status: He is alert and oriented to person, place, and time.  Psychiatric:        Behavior: Behavior normal.        Thought Content: Thought content normal.        Judgment: Judgment normal.     Results for orders placed or performed during the hospital encounter of 06/18/20  Glucose,  capillary  Result Value Ref Range   Glucose-Capillary 252 (H) 70 - 99 mg/dL      Assessment & Plan:   Problem List Items Addressed This Visit      Unprioritized   DM type 2 with diabetic peripheral neuropathy ([facility])    Seeing Endocrine.  Needs a refill as running out tonight.  Seeing Endocrine on Monday      Relevant Medications   insulin degludec (TRESIBA FLEXTOUCH) 100 UNIT/ML FlexTouch Pen   Knee pain, right    Pt with severe right knee pain.  Unable to do full exam.  No erythema.  Will get x-ray and refer to orthopedics      Relevant Orders   Ambulatory referral to Orthopedic Surgery   DG Knee Complete 4 Views Right   Uncontrolled type 2 diabetes mellitus with hyperglycemia (HCC) - Primary   Relevant Medications   insulin degludec (TRESIBA FLEXTOUCH) 100 UNIT/ML FlexTouch Pen   Other Relevant Orders   Bayer DCA Hb A1c Waived    Other Visit Diagnoses    Need for immunization against influenza       Relevant Orders   Flu  Vaccine QUAD 36+ mos IM   Skin lesions, generalized       Discussed using Eucerin cream.  Mix with Clobetasol       Follow up plan: Return if symptoms worsen or fail to improve.

## 2020-07-17 NOTE — Progress Notes (Signed)
°  Chronic Care Management   Note  07/17/2020 Name: Edward Schneider. MRN: [id number] DOB: 1956/09/05    Received  Call from Mr. Jury today at 4:40 PM. He had a schedule mix-up with his appointment on Monday. Patient has been rescheduled for 08/21/20.   Mr. Damaso states he is almost  out of his insulin and the number to [employer] he was given by endocrinology office was not correct. Per chart documentation, refill requests were faxed from endocrinology office  to [facility] on 06/21/20.  Patient will pick up RX for Tyler Aas called in today. CPA will follow up with patient tomorrow and assist with contacting [employer] and obtaining insulin.   Junita Push. Kenton Kingfisher PharmD, [facility] [phone]

## 2020-07-17 NOTE — Assessment & Plan Note (Signed)
Seeing Endocrine.  Needs a refill as running out tonight.  Seeing Endocrine on Monday

## 2020-07-17 NOTE — Assessment & Plan Note (Signed)
Pt with severe right knee pain.  Unable to do full exam.  No erythema.  Will get x-ray and refer to orthopedics

## 2020-07-18 ENCOUNTER — Telehealth: Payer: Self-pay

## 2020-07-18 NOTE — Telephone Encounter (Signed)
New message    Patient calling the office for samples of medication:   1.  What medication and dosage are you requesting samples for?insulin degludec (TRESIBA FLEXTOUCH) 100 UNIT/ML FlexTouch Pen  2.  Are you currently out of this medication? Yes

## 2020-07-18 NOTE — Chronic Care Management (AMB) (Signed)
Care Management   Note  07/18/2020 Name: Edward Schneider. MRN: [id number] DOB: Sep 06, 1956  Edward Mangle. is a 64 y.o. year old male who is a primary care patient of Dossie Arbour, Redge Gainer, MD and is actively engaged with the care management team. I reached out to Edward Mangle. by phone today to assist with re-scheduling an initial visit with the Licensed Clinical Social Worker  Follow up plan: We have been unable to make contact with the patient for follow up. The care management team is available to follow up with the patient after provider conversation with the patient regarding recommendation for care management engagement and subsequent re-referral to the care management team.   Penne Lash, RMA Care Guide, Embedded Care Coordination [facility]  [place], [place] [address] Direct Dial: [phone] [email or web address] Website: [email or web address]

## 2020-07-19 ENCOUNTER — Encounter: Payer: PPO | Attending: Internal Medicine

## 2020-07-19 ENCOUNTER — Telehealth: Payer: Self-pay | Admitting: Pharmacist

## 2020-07-19 DIAGNOSIS — Z7902 Long term (current) use of antithrombotics/antiplatelets: Secondary | ICD-10-CM | POA: Insufficient documentation

## 2020-07-19 DIAGNOSIS — Z8673 Personal history of transient ischemic attack (TIA), and cerebral infarction without residual deficits: Secondary | ICD-10-CM | POA: Insufficient documentation

## 2020-07-19 DIAGNOSIS — Z794 Long term (current) use of insulin: Secondary | ICD-10-CM | POA: Insufficient documentation

## 2020-07-19 DIAGNOSIS — M5136 Other intervertebral disc degeneration, lumbar region: Secondary | ICD-10-CM | POA: Insufficient documentation

## 2020-07-19 DIAGNOSIS — K219 Gastro-esophageal reflux disease without esophagitis: Secondary | ICD-10-CM | POA: Insufficient documentation

## 2020-07-19 DIAGNOSIS — M5030 Other cervical disc degeneration, unspecified cervical region: Secondary | ICD-10-CM | POA: Insufficient documentation

## 2020-07-19 DIAGNOSIS — I252 Old myocardial infarction: Secondary | ICD-10-CM | POA: Insufficient documentation

## 2020-07-19 DIAGNOSIS — Z87891 Personal history of nicotine dependence: Secondary | ICD-10-CM | POA: Insufficient documentation

## 2020-07-19 DIAGNOSIS — I208 Other forms of angina pectoris: Secondary | ICD-10-CM | POA: Insufficient documentation

## 2020-07-19 DIAGNOSIS — I10 Essential (primary) hypertension: Secondary | ICD-10-CM | POA: Insufficient documentation

## 2020-07-19 DIAGNOSIS — Z7982 Long term (current) use of aspirin: Secondary | ICD-10-CM | POA: Insufficient documentation

## 2020-07-19 DIAGNOSIS — E785 Hyperlipidemia, unspecified: Secondary | ICD-10-CM | POA: Insufficient documentation

## 2020-07-19 DIAGNOSIS — E119 Type 2 diabetes mellitus without complications: Secondary | ICD-10-CM | POA: Insufficient documentation

## 2020-07-19 DIAGNOSIS — Z79899 Other long term (current) drug therapy: Secondary | ICD-10-CM | POA: Insufficient documentation

## 2020-07-19 NOTE — Telephone Encounter (Signed)
Clarified with patient assistance program and samples of ozempic and tresbia have been shipped.  Left message for patient to come to the office and get medications.  He will need to re enroll in patient assistance in December.

## 2020-07-19 NOTE — Telephone Encounter (Signed)
Is it ok to give sample?

## 2020-07-19 NOTE — Progress Notes (Signed)
Chronic Care Management Pharmacy Assistant   Name: Edward Schneider.  MRN: [id number] DOB: [birth year]  Reason for Encounter:Patient Assistance Coordination   PCP : Guadalupe Maple, MD  Allergies:   Allergies  Allergen Reactions  . Bee Pollen Anaphylaxis    Died 3 times when stung by bees. Carries Epi-pen at all times.   . Bee Venom Anaphylaxis  . Crestor [Rosuvastatin Calcium] Shortness Of Breath and Swelling  . Fentanyl Itching and Hives    blisters Patch  . Gabapentin Diarrhea    Severe diarrhea which caused incontinence, loss of appetite and weight loss.  . Shellfish Allergy Anaphylaxis and Swelling    Shrimp causes throat to swell and tingling in tongue. Can eat other white fish, crabcakes, and oysters.  . Furosemide Nausea And Vomiting  . Buprenorphine Hcl Itching  . Simvastatin Diarrhea    Medications: Outpatient Encounter Medications as of 07/19/2020  Medication Sig Note  . albuterol (VENTOLIN HFA) 108 (90 Base) MCG/ACT inhaler Inhale 2 puffs into the lungs every 6 (six) hours as needed for wheezing or shortness of breath.   . Ascorbic Acid (VITAMIN C) 1000 MG tablet Take 1,000 mg by mouth daily.   Marland Kitchen aspirin EC 81 MG EC tablet Take 1 tablet (81 mg total) by mouth daily.   Marland Kitchen atorvastatin (LIPITOR) 80 MG tablet Take 1 tablet (80 mg total) by mouth daily.   . benazepril (LOTENSIN) 20 MG tablet Take 20 mg by mouth daily.   . calcium carbonate (OS-CAL) 600 MG TABS tablet Take 600 mg by mouth daily.   . Cholecalciferol (VITAMIN D3) 25 MCG (1000 UT) CHEW Chew 1 capsule by mouth daily.    Marland Kitchen CINNAMON PO Take 1,000 mg by mouth 2 (two) times daily.    . clobetasol cream (TEMOVATE) 9.47 % Apply 1 application topically 2 (two) times daily.   . clopidogrel (PLAVIX) 75 MG tablet Take 1 tablet (75 mg total) by mouth daily with breakfast.   . Continuous Blood Gluc Sensor (FREESTYLE LIBRE 14 DAY SENSOR) MISC See admin instructions.   . cyclobenzaprine (FLEXERIL) 10 MG tablet Take  1 tablet (10 mg total) by mouth 3 (three) times daily as needed for muscle spasms.   . diclofenac sodium (VOLTAREN) 1 % GEL Apply 2 g topically 4 (four) times daily.   . diphenhydrAMINE (BENADRYL) 25 mg capsule Take 25 mg by mouth 2 (two) times daily.    Marland Kitchen doxepin (SINEQUAN) 25 MG capsule Take 50 mg by mouth at bedtime.    . DULoxetine (CYMBALTA) 30 MG capsule Take 30 mg by mouth 2 (two) times daily.    . DULoxetine (CYMBALTA) 60 MG capsule Take 1 capsule (60 mg total) by mouth daily. (Patient taking differently: Take 60 mg by mouth at bedtime. )   . EPINEPHrine 0.3 mg/0.3 mL IJ SOAJ injection Inject 0.3 mLs (0.3 mg total) into the muscle as needed for anaphylaxis.   . Ginger, Zingiber officinalis, (GINGER PO) Take 1 Dose by mouth daily.   . Ginseng 100 MG CAPS Take by mouth.   . insulin degludec (TRESIBA FLEXTOUCH) 100 UNIT/ML FlexTouch Pen Inject 28 Units into the skin daily.   . insulin lispro (HUMALOG KWIKPEN) 100 UNIT/ML KwikPen Inject 0.16 mLs (16 Units total) into the skin 2 (two) times daily with breakfast and lunch AND 0.18 mLs (18 Units total) daily with supper.   . Insulin NPH Isophane & Regular (NOVOLIN 70/30 FLEXPEN [place]) Inject into the skin. 18 units with breakfast, 16  units with lunch, 16 units with dinner   . isosorbide mononitrate (IMDUR) 60 MG 24 hr tablet Take 1.5 tablets (90 mg total) by mouth daily.   . Multiple Vitamins-Minerals (HAIR SKIN AND NAILS FORMULA PO) Take 1 capsule by mouth daily.    . mupirocin ointment (BACTROBAN) 2 % Apply 1 application topically daily.    . NARCAN 4 MG/0.1ML LIQD nasal spray kit Place 0.4 mg into the nose once.    . nitroGLYCERIN (NITROSTAT) 0.4 MG SL tablet Place 1 tablet (0.4 mg total) under the tongue every 5 (five) minutes as needed for chest pain.   . Omega-3 1000 MG CAPS Take 1,000 mg by mouth daily.    . oxyCODONE (ROXICODONE) 15 MG immediate release tablet TAKE 1/2 TO 1 (ONE HALF TO ONE) TABLET BY MOUTH FOUR TO SIX TIMES DAILY IF  TOLERATED NOTE TABLET IS 15 MG (Patient taking differently: Take 7.5-15 mg by mouth every 4 (four) hours as needed for pain. TAKE 1/2 TO 1 (ONE HALF TO ONE) TABLET BY MOUTH FOUR TO SIX TIMES DAILY IF TOLERATED NOTE TABLET IS 15 MG) 09/26/2019: Taking 6 tab per day  . pantoprazole (PROTONIX) 40 MG tablet Take 1 tablet by mouth twice daily (Patient taking differently: Take 40 mg by mouth 2 (two) times daily. )   . pregabalin (LYRICA) 50 MG capsule TAKE 1 CAPSULE BY MOUTH THREE TIMES DAILY (Patient taking differently: Take 50 mg by mouth 3 (three) times daily. )   . Semaglutide,0.25 or 0.5MG/DOS, (OZEMPIC, 0.25 OR 0.5 MG/DOSE,) 2 MG/1.5ML SOPN Inject 0.5 mg into the skin once a week.   . sucralfate (CARAFATE) 1 g tablet TAKE 1 TABLET BY MOUTH 4 TIMES DAILY... WITH MEALS AND AT BEDTIME (Patient taking differently: Take 1 g by mouth 4 (four) times daily -  with meals and at bedtime. TAKE 1 TABLET BY MOUTH 4 TIMES DAILY... WITH MEALS AND AT BEDTIME)   . Tiotropium Bromide-Olodaterol (STIOLTO RESPIMAT) 2.5-2.5 MCG/ACT AERS Inhale 2 puffs into the lungs daily.   . traZODone (DESYREL) 100 MG tablet Take 1 tablet (100 mg total) by mouth at bedtime as needed for sleep.   . triamcinolone cream (KENALOG) 0.1 % Apply 1 application topically 2 (two) times daily.   . vitamin A 10000 UNIT capsule Take 10,000 Units by mouth daily.   . vitamin B-12 (CYANOCOBALAMIN) 1000 MCG tablet Take 1,000 mcg by mouth daily.   . Vitamin E 400 units TABS Take 400 Units by mouth daily.     No facility-administered encounter medications on file as of 07/19/2020.    Current Diagnosis: Patient Active Problem List   Diagnosis Date Noted  . Family history of malignant neoplasm of gastrointestinal tract   . Shortness of breath 06/08/2020  . Hypoxia 06/08/2020  . Chest pain 05/29/2020  . Depression 05/29/2020  . CAD in native artery 05/29/2020  . Stroke (HCC) 05/29/2020  . LUQ abdominal pain 05/29/2020  . Essential hypertension   .  Depression, major, single episode, moderate (HCC) 03/06/2020  . BMI 37.0-37.9, adult 03/06/2020  . Uncontrolled type 2 diabetes mellitus with hyperglycemia (HCC) 10/29/2019  . Coronary artery disease of native artery of native heart with stable angina pectoris (HCC) 08/31/2019  . Pain due to onychomycosis of toenails of both feet 08/10/2019  . Morbid obesity (HCC) 05/17/2019  . Syncope 01/14/2019  . BPH (benign prostatic hyperplasia) 04/27/2018  . Knee pain, right 02/08/2018  . Sleep apnea 02/08/2018  . GERD (gastroesophageal reflux disease) 04/04/2017  .   Advanced care planning/counseling discussion 03/29/2017  . DM type 2 with diabetic peripheral neuropathy ([place]) 09/30/2016  . Rash 09/24/2016  . Insomnia 12/24/2015  . Hyperlipidemia associated with type 2 diabetes mellitus ([place]) 09/23/2015  . Bilateral carotid artery stenosis 09/11/2015  . Atherosclerosis of abdominal aorta ([place]) 08/26/2015  . Migraine headache 08/21/2015  . Chronic obstructive pulmonary disease ([place]) 04/25/2015  . Hypertension associated with diabetes ([place]) 04/25/2015  . DDD (degenerative disc disease), cervical 03/27/2015  . DDD (degenerative disc disease), lumbar 03/27/2015  . Cervical post-laminectomy syndrome 03/27/2015  . Bilateral occipital neuralgia 03/27/2015  . Pancreatic insufficiency 01/28/2015  . Chronic back pain 12/24/2014    07/19/20-Unsuccessful reaching Edward Schneider; lvm with a call back number so that we can discuss medication adherence. Called Endocrinology and spoke with receptionist who states Dr.Shamleffer emailed a message at 10:23 to Nurse (unspecified due to rotation) for follow up status for Edward Schneider regarding application being sent to [employer] for need of insulin and no response has been given yet.  08/15/20- CPA attempted to follow up with the patient in regards to medication adherence. No answer; lvm.  Raynelle Highland, [place] Clinical Pharmicist Assistant [phone]    Follow-Up: CPA to follow up with patient on 08/20/20.

## 2020-07-22 ENCOUNTER — Other Ambulatory Visit: Payer: Self-pay

## 2020-07-22 ENCOUNTER — Telehealth: Payer: Self-pay

## 2020-07-22 ENCOUNTER — Encounter: Payer: Self-pay | Admitting: Internal Medicine

## 2020-07-22 ENCOUNTER — Ambulatory Visit (INDEPENDENT_AMBULATORY_CARE_PROVIDER_SITE_OTHER): Payer: PPO | Admitting: Internal Medicine

## 2020-07-22 VITALS — BP 126/82 | HR 92 | Ht 68.0 in | Wt 246.4 lb

## 2020-07-22 DIAGNOSIS — E1159 Type 2 diabetes mellitus with other circulatory complications: Secondary | ICD-10-CM | POA: Diagnosis not present

## 2020-07-22 DIAGNOSIS — E1165 Type 2 diabetes mellitus with hyperglycemia: Secondary | ICD-10-CM | POA: Diagnosis not present

## 2020-07-22 DIAGNOSIS — E1142 Type 2 diabetes mellitus with diabetic polyneuropathy: Secondary | ICD-10-CM

## 2020-07-22 NOTE — Telephone Encounter (Signed)
Candace at [facility] [phone] called to request return ph call to check for delivery of Novolog & Tresiba & Ozempic to the office for pt to pick up. Please advise.

## 2020-07-22 NOTE — Patient Instructions (Signed)
-   Tresiba 28 units daily  - Novolog 18 units with Breakfast and 16 units with Lunch, and  16 units with supper - Continue Ozempic 0.5 mg weekly      HOW TO TREAT LOW BLOOD SUGARS (Blood sugar LESS THAN 70 MG/DL)  Please follow the RULE OF 15 for the treatment of hypoglycemia treatment (when your (blood sugars are less than 70 mg/dL)    STEP 1: Take 15 grams of carbohydrates when your blood sugar is low, which includes:   3-4 GLUCOSE TABS  OR  3-4 OZ OF JUICE OR REGULAR SODA OR  ONE TUBE OF GLUCOSE GEL     STEP 2: RECHECK blood sugar in 15 MINUTES STEP 3: If your blood sugar is still low at the 15 minute recheck --> then, go back to STEP 1 and treat AGAIN with another 15 grams of carbohydrates.
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
## 2020-07-23 ENCOUNTER — Telehealth: Payer: Self-pay | Admitting: *Deleted

## 2020-07-23 ENCOUNTER — Encounter: Payer: Self-pay | Admitting: *Deleted

## 2020-07-23 ENCOUNTER — Telehealth: Payer: Self-pay | Admitting: Pharmacist

## 2020-07-23 DIAGNOSIS — I208 Other forms of angina pectoris: Secondary | ICD-10-CM

## 2020-07-23 NOTE — Telephone Encounter (Signed)
Called to check on pt.  He is going to need a knee replacement.  He would like to discharge at this time.

## 2020-07-23 NOTE — Progress Notes (Signed)
Chronic Care Management Pharmacy Assistant   Name: Edward Schneider.  MRN: [id number] DOB: 1955/12/14  Reason for Encounter: Medication Review   PCP : Guadalupe Maple, MD  Allergies:   Allergies  Allergen Reactions   Bee Pollen Anaphylaxis    Died 3 times when stung by bees. Carries Epi-pen at all times.    Bee Venom Anaphylaxis   Crestor [Rosuvastatin Calcium] Shortness Of Breath and Swelling   Fentanyl Itching and Hives    blisters Patch   Gabapentin Diarrhea    Severe diarrhea which caused incontinence, loss of appetite and weight loss.   Shellfish Allergy Anaphylaxis and Swelling    Shrimp causes throat to swell and tingling in tongue. Can eat other white fish, crabcakes, and oysters.   Furosemide Nausea And Vomiting   Buprenorphine Hcl Itching   Simvastatin Diarrhea    Medications: Outpatient Encounter Medications as of 07/23/2020  Medication Sig Note   albuterol (VENTOLIN HFA) 108 (90 Base) MCG/ACT inhaler Inhale 2 puffs into the lungs every 6 (six) hours as needed for wheezing or shortness of breath.    Ascorbic Acid (VITAMIN C) 1000 MG tablet Take 1,000 mg by mouth daily.    aspirin EC 81 MG EC tablet Take 1 tablet (81 mg total) by mouth daily.    atorvastatin (LIPITOR) 80 MG tablet Take 1 tablet (80 mg total) by mouth daily.    benazepril (LOTENSIN) 20 MG tablet Take 20 mg by mouth daily.    calcium carbonate (OS-CAL) 600 MG TABS tablet Take 600 mg by mouth daily.    Cholecalciferol (VITAMIN D3) 25 MCG (1000 UT) CHEW Chew 1 capsule by mouth daily.     CINNAMON PO Take 1,000 mg by mouth 2 (two) times daily.     clobetasol cream (TEMOVATE) 5.46 % Apply 1 application topically 2 (two) times daily.    clopidogrel (PLAVIX) 75 MG tablet Take 1 tablet (75 mg total) by mouth daily with breakfast.    Continuous Blood Gluc Sensor (FREESTYLE LIBRE 14 DAY SENSOR) MISC See admin instructions.    cyclobenzaprine (FLEXERIL) 10 MG tablet Take 1 tablet (10  mg total) by mouth 3 (three) times daily as needed for muscle spasms.    diclofenac sodium (VOLTAREN) 1 % GEL Apply 2 g topically 4 (four) times daily.    diphenhydrAMINE (BENADRYL) 25 mg capsule Take 25 mg by mouth 2 (two) times daily.     doxepin (SINEQUAN) 25 MG capsule Take 50 mg by mouth at bedtime.     DULoxetine (CYMBALTA) 30 MG capsule Take 30 mg by mouth 2 (two) times daily.     DULoxetine (CYMBALTA) 60 MG capsule Take 1 capsule (60 mg total) by mouth daily. (Patient taking differently: Take 60 mg by mouth at bedtime. )    EPINEPHrine 0.3 mg/0.3 mL IJ SOAJ injection Inject 0.3 mLs (0.3 mg total) into the muscle as needed for anaphylaxis.    Ginger, Zingiber officinalis, (GINGER PO) Take 1 Dose by mouth daily.    Ginseng 100 MG CAPS Take by mouth.    insulin degludec (TRESIBA FLEXTOUCH) 100 UNIT/ML FlexTouch Pen Inject 28 Units into the skin daily.    insulin lispro (HUMALOG KWIKPEN) 100 UNIT/ML KwikPen Inject 0.16 mLs (16 Units total) into the skin 2 (two) times daily with breakfast and lunch AND 0.18 mLs (18 Units total) daily with supper.    Insulin NPH Isophane & Regular (NOVOLIN 70/30 FLEXPEN [place]) Inject into the skin. 18 units with breakfast,  16 units with lunch, 16 units with dinner    isosorbide mononitrate (IMDUR) 60 MG 24 hr tablet Take 1.5 tablets (90 mg total) by mouth daily.    Multiple Vitamins-Minerals (HAIR SKIN AND NAILS FORMULA PO) Take 1 capsule by mouth daily.     mupirocin ointment (BACTROBAN) 2 % Apply 1 application topically daily.     NARCAN 4 MG/0.1ML LIQD nasal spray kit Place 0.4 mg into the nose once.     nitroGLYCERIN (NITROSTAT) 0.4 MG SL tablet Place 1 tablet (0.4 mg total) under the tongue every 5 (five) minutes as needed for chest pain.    Omega-3 1000 MG CAPS Take 1,000 mg by mouth daily.     oxyCODONE (ROXICODONE) 15 MG immediate release tablet TAKE 1/2 TO 1 (ONE HALF TO ONE) TABLET BY MOUTH FOUR TO SIX TIMES DAILY IF TOLERATED NOTE TABLET  IS 15 MG (Patient taking differently: Take 7.5-15 mg by mouth every 4 (four) hours as needed for pain. TAKE 1/2 TO 1 (ONE HALF TO ONE) TABLET BY MOUTH FOUR TO SIX TIMES DAILY IF TOLERATED NOTE TABLET IS 15 MG) 09/26/2019: Taking 6 tab per day   pantoprazole (PROTONIX) 40 MG tablet Take 1 tablet by mouth twice daily (Patient taking differently: Take 40 mg by mouth 2 (two) times daily. )    pregabalin (LYRICA) 50 MG capsule TAKE 1 CAPSULE BY MOUTH THREE TIMES DAILY (Patient taking differently: Take 50 mg by mouth 3 (three) times daily. )    Semaglutide,0.25 or 0.5MG/DOS, (OZEMPIC, 0.25 OR 0.5 MG/DOSE,) 2 MG/1.5ML SOPN Inject 0.5 mg into the skin once a week.    sucralfate (CARAFATE) 1 g tablet TAKE 1 TABLET BY MOUTH 4 TIMES DAILY... WITH MEALS AND AT BEDTIME (Patient taking differently: Take 1 g by mouth 4 (four) times daily -  with meals and at bedtime. TAKE 1 TABLET BY MOUTH 4 TIMES DAILY... WITH MEALS AND AT BEDTIME)    Tiotropium Bromide-Olodaterol (STIOLTO RESPIMAT) 2.5-2.5 MCG/ACT AERS Inhale 2 puffs into the lungs daily.    traZODone (DESYREL) 100 MG tablet Take 1 tablet (100 mg total) by mouth at bedtime as needed for sleep.    triamcinolone cream (KENALOG) 0.1 % Apply 1 application topically 2 (two) times daily.    vitamin A 10000 UNIT capsule Take 10,000 Units by mouth daily.    vitamin B-12 (CYANOCOBALAMIN) 1000 MCG tablet Take 1,000 mcg by mouth daily.    Vitamin E 400 units TABS Take 400 Units by mouth daily.     No facility-administered encounter medications on file as of 07/23/2020.    Current Diagnosis: Patient Active Problem List   Diagnosis Date Noted   Family history of malignant neoplasm of gastrointestinal tract    Shortness of breath 06/08/2020   Hypoxia 06/08/2020   Chest pain 05/29/2020   Depression 05/29/2020   CAD in native artery 05/29/2020   Stroke ([place]) 05/29/2020   LUQ abdominal pain 05/29/2020   Essential hypertension    Depression, major,  single episode, moderate ([place]) 03/06/2020   BMI 37.0-37.9, adult 03/06/2020   Uncontrolled type 2 diabetes mellitus with hyperglycemia ([place]) 10/29/2019   Coronary artery disease of native artery of native heart with stable angina pectoris ([place]) 08/31/2019   Pain due to onychomycosis of toenails of both feet 08/10/2019   Morbid obesity ([place]) 05/17/2019   Syncope 01/14/2019   BPH (benign prostatic hyperplasia) 04/27/2018   Knee pain, right 02/08/2018   Sleep apnea 02/08/2018   GERD (gastroesophageal reflux disease) 04/04/2017  Advanced care planning/counseling discussion 03/29/2017   DM type 2 with diabetic peripheral neuropathy ([place]) 09/30/2016   Rash 09/24/2016   Insomnia 12/24/2015   Hyperlipidemia associated with type 2 diabetes mellitus ([place]) 09/23/2015   Bilateral carotid artery stenosis 09/11/2015   Atherosclerosis of abdominal aorta ([place]) 08/26/2015   Migraine headache 08/21/2015   Chronic obstructive pulmonary disease ([place]) 04/25/2015   Hypertension associated with diabetes ([place]) 04/25/2015   DDD (degenerative disc disease), cervical 03/27/2015   DDD (degenerative disc disease), lumbar 03/27/2015   Cervical post-laminectomy syndrome 03/27/2015   Bilateral occipital neuralgia 03/27/2015   Pancreatic insufficiency 01/28/2015   Chronic back pain 12/24/2014     07/23/20-Attempted to call patient to follow up after pick up of samples from Endocrinologist office; however no answer. Left message for patient to return call at his earliest convenience. Also, will follow up with Dr. Kelton Pillar office regarding insulin refills status. Birdena Crandall, CPP notified.    Follow-Up:  Pharmacist Review     Raynelle Highland, [place] Assistant [phone]

## 2020-07-23 NOTE — Progress Notes (Signed)
Discharge Progress Report  Patient Details  Name: Edward Schneider. MRN: [id number] Date of Birth: 11/12/1955 Referring Provider:     Cardiac Rehab from 05/21/2020 in [facility]  Referring Provider End       Number of Visits: 2  Reason for Discharge:  Early Exit:  Lack of attendance and Needs Knee Replacement  Smoking History:  Social History   Tobacco Use  Smoking Status Former Smoker  . Packs/day: 2.00  . Years: 50.00  . Pack years: 100.00  . Types: Cigarettes  . Quit date: 2011  . Years since quitting: 10.7  Smokeless Tobacco Never Used    Diagnosis:  Stable angina (HCC)  ADL [facility]SD:   Initial Exercise Prescription:  Initial Exercise Prescription - 05/21/20 1500      Date of Initial Exercise RX and Referring Provider   Date 05/21/20    Referring Provider End      Treadmill   MPH 1    Grade 0    Minutes 15    METs 1.77      NuStep   Level 1    SPM 80    Minutes 15    METs 2      Biostep-RELP   Level 2    SPM 50    Minutes 15    METs 2      Prescription Details   Frequency (times per week) 3    Duration Progress to 30 minutes of continuous aerobic without signs/symptoms of physical distress      Intensity   THRR 40-80% of Max Heartrate <110    Ratings of Perceived Exertion 11-13    Perceived Dyspnea 0-4      Resistance Training   Training Prescription Yes    Weight 3 lb    Reps 10-15           Discharge Exercise Prescription (Final Exercise Prescription Changes):  Exercise Prescription Changes - 05/28/20 1400      Response to Exercise   Blood Pressure (Admit) 118/66    Blood Pressure (Exercise) 132/68    Blood Pressure (Exit) 120/76    Heart Rate (Admit) 92 bpm    Heart Rate (Exercise) 113 bpm    Heart Rate (Exit) 89 bpm    Rating of Perceived Exertion (Exercise) 15    Perceived Dyspnea (Exercise) 2    Symptoms none    Comments first full day of exercise    Duration Progress to 30 minutes of  aerobic  without signs/symptoms of physical distress    Intensity THRR unchanged      Progression   Progression Continue to progress workloads to maintain intensity without signs/symptoms of physical distress.    Average METs 2.29      Resistance Training   Training Prescription Yes    Weight 3 lb    Reps 10-15      Interval Training   Interval Training No      Treadmill   MPH 1    Grade 0    Minutes 15    METs 1.77      NuStep   Level 1    Minutes 15    METs 2.8           Functional Capacity:  6 Minute Walk    Row Name 05/21/20 1507         6 Minute Walk   Phase Initial     Distance 952 feet     Walk Time  5 minutes     # of Rest Breaks 1     MPH 2.16     METS 2.08     RPE 15     Perceived Dyspnea  2     VO2 Peak 7.3     Symptoms Yes (comment)     Comments hips hurt 6/10 - cheat pain 8/10 - used inhaler and rested  Heath Lark, RN educated on proper use of inhaler - pain relieved with rest     Resting HR 94 bpm     Resting BP 100/60     Resting Oxygen Saturation  95 %     Exercise Oxygen Saturation  during 6 min walk 92 %     Max Ex. HR 120 bpm     Max Ex. BP 108/56     2 Minute Post BP 98/54              Nutrition & Weight - Outcomes:  Pre Biometrics - 05/21/20 1509      Pre Biometrics   Height 5' 8.5" (1.74 m)    Weight 250 lb (113.4 kg)   self reported   BMI (Calculated) 37.46    Single Leg Stand 6.2 seconds

## 2020-07-23 NOTE — Progress Notes (Signed)
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 instruction with the use of models to demonstrate the basic use of the AED with the basic ABC's of resuscitation.   Anatomy &  Physiology of the Heart: - Group verbal and written instruction and models provide basic cardiac anatomy and physiology, with the coronary electrical and arterial systems. Review of Valvular disease and Heart Failure   Cardiac Procedures: - Group verbal and written instruction to review commonly prescribed medications for heart disease. Reviews the medication, class of the drug, and side effects. Includes the steps to properly store meds and maintain the prescription regimen. (beta blockers and nitrates)   Cardiac Medications I: - Group verbal and written instruction to review commonly prescribed medications for heart disease. Reviews the medication, class of the drug,  and side effects. Includes the steps to properly store meds and maintain the prescription regimen.   Cardiac Medications II: -Group verbal and written instruction to review commonly prescribed medications for heart disease. Reviews the medication, class of the drug, and side effects. (all other drug classes)    Go Sex-Intimacy & Heart Disease, Get SMART - Goal Setting: - Group verbal and written instruction through game format to discuss heart disease and the return to sexual intimacy. Provides group verbal and written material to discuss and apply goal setting through the application of the S.M.A.R.T. Method.   Other Matters of the Heart: - Provides group verbal, written materials and models to describe Stable Angina and Peripheral Artery. Includes description of the disease process and treatment options available to the cardiac patient.   Infection Prevention: - Provides verbal and written material to individual with discussion of infection control including proper hand washing and proper equipment cleaning during exercise session.   Cardiac Rehab from 05/24/2020 in [facility] Cardiac and Pulmonary Rehab  Date 05/21/20  Educator AS  Instruction Review Code 1- Verbalizes Understanding      Falls Prevention: - Provides verbal and written material to individual with discussion of falls prevention and safety.   Cardiac Rehab from 05/24/2020 in [facility] Cardiac and Pulmonary Rehab  Date 05/21/20  Educator AS  Instruction Review Code 1- Verbalizes Understanding      Other: -Provides group and verbal instruction on various topics (see comments)   Knowledge Questionnaire Score:  Knowledge Questionnaire Score - 05/21/20 1517      Knowledge Questionnaire Score   Pre Score 22/26 nutirtion/angina           Core Components/Risk Factors/Patient Goals at Admission:  Personal Goals and Risk Factors at Admission - 05/21/20 1518      Core Components/Risk Factors/Patient Goals on Admission     Weight Management Yes    Intervention Weight Management/Obesity: Establish reasonable short term and long term weight goals.    Admit Weight 250 lb (113.4 kg)    Goal Weight: Short Term 245 lb (111.1 kg)    Goal Weight: Long Term 240 lb (108.9 kg)    Expected Outcomes Short Term: Continue to assess and modify interventions until short term weight is achieved;Long Term: Adherence to nutrition and physical activity/exercise program aimed toward attainment of established weight goal;Understanding recommendations for meals to include 15-35% energy as protein, 25-35% energy from fat, 35-60% energy from carbohydrates, less than $RemoveB'200mg'[id number]$  of dietary cholesterol, 20-35 gm of total fiber daily;Weight Loss: Understanding of general recommendations for a balanced deficit meal plan, which promotes 1-2 lb weight loss per week and includes a negative energy balance of [phone] kcal/d    Diabetes Yes    Intervention Provide education about signs/symptoms and action to take for hypo/hyperglycemia.;Provide education about proper nutrition, including hydration, and aerobic/resistive exercise prescription along with prescribed medications to achieve blood glucose in normal  ranges: Fasting glucose 65-99 mg/dL    Expected Outcomes Short Term: Participant verbalizes understanding of the signs/symptoms and immediate care of hyper/hypoglycemia, proper foot care and importance of medication, aerobic/resistive exercise and nutrition plan for blood glucose control.;Long Term: Attainment of HbA1C < 7%.    Hypertension Yes    Intervention Provide education on lifestyle modifcations including regular physical activity/exercise, weight management, moderate sodium restriction and increased consumption of fresh fruit, vegetables, and low fat dairy, alcohol moderation, and smoking cessation.;Monitor prescription use compliance.    Expected Outcomes Short Term: Continued assessment and intervention until BP is < 140/67m HG in hypertensive  participants. < 130/811mHG in hypertensive participants with diabetes, heart failure or chronic kidney disease.;Long Term: Maintenance of blood pressure at goal levels.    Lipids Yes    Intervention Provide education and support for participant on nutrition & aerobic/resistive exercise along with prescribed medications to achieve LDL <7067mHDL >56m62m  Expected Outcomes Short Term: Participant states understanding of desired cholesterol values and is compliant with medications prescribed. Participant is following exercise prescription and nutrition guidelines.;Long Term: Cholesterol controlled with medications as prescribed, with individualized exercise RX and with personalized nutrition plan. Value goals: LDL < 70mg39mL > 40 mg.           Education:Diabetes - Individual verbal and written instruction to review signs/symptoms of diabetes, desired ranges of glucose level fasting, after meals and with exercise. Acknowledge that pre and post exercise glucose checks will be done for 3 sessions at entry of program.   [facility]  Date 05/24/20  Educator SB  Instruction Review Code 1- Verba[employer] States Steel Corporationrstanding      Education: Know Your Numbers and Risk Factors: -Group verbal and written instruction about important numbers in your health.  Discussion of what are risk factors and how they play a role in the disease process.  Review of Cholesterol, Blood Pressure, Diabetes, and BMI and the role they play in your overall health.   Core Components/Risk Factors/Patient Goals Review:    Core Components/Risk Factors/Patient Goals at Discharge (Final Review):    ITP Comments:  ITP Comments    Row Name 05/17/20 1336 05/21/20 1525 05/24/20 0826 05/29/20 0529 05/30/20 1013   ITP Comments Initial telephone orientation completed. Diagnosis can be found in 7/20. EP orientation 8/3 at 2pm Completed 6MWT and gym orientation. Initial ITP created and sent  for review to Dr. Mark Emily Filbertical Director. First full day of exercise!  Patient was oriented to gym and equipment including functions, settings, policies, and procedures.  Patient's individual exercise prescription and treatment plan were reviewed.  All starting workloads were established based on the results of the 6 minute walk test done at initial orientation visit.  The plan for exercise progression was also introduced and progression will be customized based on patient's performance and goals. 30 Day review completed. Medical Director ITP review done, changes made as directed, and signed approval by Medical Director.  New to program Patient called, reported that he will be discharged from the hospital today. His next day to come back to Cardiac Rehab will be next Wednesday, 8/18. Patient stated he will bring a clearance letter.   Row NPeyton 06/11/20 1521 06/25/20 1517 06/26/20 1152 07/11/20 0828 07/23/20 1520   ITP Comments Patient has not attended since last review.  We have not yet recieved clearance for him to return. LarryHorracetill out since 8/6.  He had a colonoscopy  on 06/18/20. He has a pulmonary follow up on 9/10.  We will see what they say about resuming rehab.  Still have not recieved clearance from Dr. Saunders Revel for him to return. 30 day review completed. ITP sent to Dr. Emily Filbert, Medical Director of Cardiac and Pulmonary Rehab. Continue with ITP unless changes are made by physician. Patient called- He has been out of rehab for a little while now and reported that he has been put on new medications and overall feels much better. He will be back to [facility] next Monday, 9/27. Called to check on pt.  He is going to need a knee replacement.  He would like to discharge at this time.          Comments: Discharge ITP

## 2020-07-23 NOTE — Telephone Encounter (Signed)
Patient picked up [place] and Ozempic samples.

## 2020-07-24 ENCOUNTER — Telehealth: Payer: Self-pay | Admitting: Internal Medicine

## 2020-07-24 ENCOUNTER — Telehealth: Payer: Self-pay

## 2020-07-24 ENCOUNTER — Telehealth: Payer: Self-pay | Admitting: Pharmacist

## 2020-07-24 ENCOUNTER — Encounter: Payer: Self-pay | Admitting: *Deleted

## 2020-07-24 DIAGNOSIS — I208 Other forms of angina pectoris: Secondary | ICD-10-CM

## 2020-07-24 NOTE — Telephone Encounter (Signed)
Please let the pt know that I was able to get hold of [facility] to follow up on his application.    They sent him the Ozempic and [place] but not the Novolog. They need a new prescription for this and was supposed to have faxed it last night ( please check the fax machine)     Please give him their number which is [phone] , they have a patient line so he can call them when he needs refills.    Thanks   Abby Nena Jordan, MD  [facility]  [facility] [address] [place], [place] [address] Phone: [phone] FAX: [phone]

## 2020-07-24 NOTE — Progress Notes (Signed)
Chronic Care Management Pharmacy Assistant   Name: Edward Schneider.  MRN: [id number] DOB: 1956/05/15  Reason for Encounter: Medication Review   PCP : Guadalupe Maple, MD  Allergies:   Allergies  Allergen Reactions  . Bee Pollen Anaphylaxis    Died 3 times when stung by bees. Carries Epi-pen at all times.   . Bee Venom Anaphylaxis  . Crestor [Rosuvastatin Calcium] Shortness Of Breath and Swelling  . Fentanyl Itching and Hives    blisters Patch  . Gabapentin Diarrhea    Severe diarrhea which caused incontinence, loss of appetite and weight loss.  . Shellfish Allergy Anaphylaxis and Swelling    Shrimp causes throat to swell and tingling in tongue. Can eat other white fish, crabcakes, and oysters.  . Furosemide Nausea And Vomiting  . Buprenorphine Hcl Itching  . Simvastatin Diarrhea    Medications: Outpatient Encounter Medications as of 07/24/2020  Medication Sig Note  . albuterol (VENTOLIN HFA) 108 (90 Base) MCG/ACT inhaler Inhale 2 puffs into the lungs every 6 (six) hours as needed for wheezing or shortness of breath.   . Ascorbic Acid (VITAMIN C) 1000 MG tablet Take 1,000 mg by mouth daily.   Marland Kitchen aspirin EC 81 MG EC tablet Take 1 tablet (81 mg total) by mouth daily.   Marland Kitchen atorvastatin (LIPITOR) 80 MG tablet Take 1 tablet (80 mg total) by mouth daily.   . benazepril (LOTENSIN) 20 MG tablet Take 20 mg by mouth daily.   . calcium carbonate (OS-CAL) 600 MG TABS tablet Take 600 mg by mouth daily.   . Cholecalciferol (VITAMIN D3) 25 MCG (1000 UT) CHEW Chew 1 capsule by mouth daily.    Marland Kitchen CINNAMON PO Take 1,000 mg by mouth 2 (two) times daily.    . clobetasol cream (TEMOVATE) 1.76 % Apply 1 application topically 2 (two) times daily.   . clopidogrel (PLAVIX) 75 MG tablet Take 1 tablet (75 mg total) by mouth daily with breakfast.   . Continuous Blood Gluc Sensor (FREESTYLE LIBRE 14 DAY SENSOR) MISC See admin instructions.   . cyclobenzaprine (FLEXERIL) 10 MG tablet Take 1 tablet (10  mg total) by mouth 3 (three) times daily as needed for muscle spasms.   . diclofenac sodium (VOLTAREN) 1 % GEL Apply 2 g topically 4 (four) times daily.   . diphenhydrAMINE (BENADRYL) 25 mg capsule Take 25 mg by mouth 2 (two) times daily.    Marland Kitchen doxepin (SINEQUAN) 25 MG capsule Take 50 mg by mouth at bedtime.    . DULoxetine (CYMBALTA) 30 MG capsule Take 30 mg by mouth 2 (two) times daily.    . DULoxetine (CYMBALTA) 60 MG capsule Take 1 capsule (60 mg total) by mouth daily. (Patient taking differently: Take 60 mg by mouth at bedtime. )   . EPINEPHrine 0.3 mg/0.3 mL IJ SOAJ injection Inject 0.3 mLs (0.3 mg total) into the muscle as needed for anaphylaxis.   . Ginger, Zingiber officinalis, (GINGER PO) Take 1 Dose by mouth daily.   . Ginseng 100 MG CAPS Take by mouth.   . insulin degludec (TRESIBA FLEXTOUCH) 100 UNIT/ML FlexTouch Pen Inject 28 Units into the skin daily.   . insulin lispro (HUMALOG KWIKPEN) 100 UNIT/ML KwikPen Inject 0.16 mLs (16 Units total) into the skin 2 (two) times daily with breakfast and lunch AND 0.18 mLs (18 Units total) daily with supper.   . Insulin NPH Isophane & Regular (NOVOLIN 70/30 FLEXPEN [place]) Inject into the skin. 18 units with breakfast, 16  units with lunch, 16 units with dinner   . isosorbide mononitrate (IMDUR) 60 MG 24 hr tablet Take 1.5 tablets (90 mg total) by mouth daily.   . Multiple Vitamins-Minerals (HAIR SKIN AND NAILS FORMULA PO) Take 1 capsule by mouth daily.    . mupirocin ointment (BACTROBAN) 2 % Apply 1 application topically daily.    Marland Kitchen NARCAN 4 MG/0.1ML LIQD nasal spray kit Place 0.4 mg into the nose once.    . nitroGLYCERIN (NITROSTAT) 0.4 MG SL tablet Place 1 tablet (0.4 mg total) under the tongue every 5 (five) minutes as needed for chest pain.   . Omega-3 1000 MG CAPS Take 1,000 mg by mouth daily.    Marland Kitchen oxyCODONE (ROXICODONE) 15 MG immediate release tablet TAKE 1/2 TO 1 (ONE HALF TO ONE) TABLET BY MOUTH FOUR TO SIX TIMES DAILY IF TOLERATED NOTE TABLET  IS 15 MG (Patient taking differently: Take 7.5-15 mg by mouth every 4 (four) hours as needed for pain. TAKE 1/2 TO 1 (ONE HALF TO ONE) TABLET BY MOUTH FOUR TO SIX TIMES DAILY IF TOLERATED NOTE TABLET IS 15 MG) 09/26/2019: Taking 6 tab per day  . pantoprazole (PROTONIX) 40 MG tablet Take 1 tablet by mouth twice daily (Patient taking differently: Take 40 mg by mouth 2 (two) times daily. )   . pregabalin (LYRICA) 50 MG capsule TAKE 1 CAPSULE BY MOUTH THREE TIMES DAILY (Patient taking differently: Take 50 mg by mouth 3 (three) times daily. )   . Semaglutide,0.25 or 0.5MG/DOS, (OZEMPIC, 0.25 OR 0.5 MG/DOSE,) 2 MG/1.5ML SOPN Inject 0.5 mg into the skin once a week.   . sucralfate (CARAFATE) 1 g tablet TAKE 1 TABLET BY MOUTH 4 TIMES DAILY... WITH MEALS AND AT BEDTIME (Patient taking differently: Take 1 g by mouth 4 (four) times daily -  with meals and at bedtime. TAKE 1 TABLET BY MOUTH 4 TIMES DAILY... WITH MEALS AND AT BEDTIME)   . Tiotropium Bromide-Olodaterol (STIOLTO RESPIMAT) 2.5-2.5 MCG/ACT AERS Inhale 2 puffs into the lungs daily.   . traZODone (DESYREL) 100 MG tablet Take 1 tablet (100 mg total) by mouth at bedtime as needed for sleep.   Marland Kitchen triamcinolone cream (KENALOG) 0.1 % Apply 1 application topically 2 (two) times daily.   . vitamin A 10000 UNIT capsule Take 10,000 Units by mouth daily.   . vitamin B-12 (CYANOCOBALAMIN) 1000 MCG tablet Take 1,000 mcg by mouth daily.   . Vitamin E 400 units TABS Take 400 Units by mouth daily.     No facility-administered encounter medications on file as of 07/24/2020.    Current Diagnosis: Patient Active Problem List   Diagnosis Date Noted  . Family history of malignant neoplasm of gastrointestinal tract   . Shortness of breath 06/08/2020  . Hypoxia 06/08/2020  . Chest pain 05/29/2020  . Depression 05/29/2020  . CAD in native artery 05/29/2020  . Stroke ([place]) 05/29/2020  . LUQ abdominal pain 05/29/2020  . Essential hypertension   . Depression, major,  single episode, moderate ([place]) 03/06/2020  . BMI 37.0-37.9, adult 03/06/2020  . Uncontrolled type 2 diabetes mellitus with hyperglycemia ([place]) 10/29/2019  . Coronary artery disease of native artery of native heart with stable angina pectoris ([place]) 08/31/2019  . Pain due to onychomycosis of toenails of both feet 08/10/2019  . Morbid obesity ([place]) 05/17/2019  . Syncope 01/14/2019  . BPH (benign prostatic hyperplasia) 04/27/2018  . Knee pain, right 02/08/2018  . Sleep apnea 02/08/2018  . GERD (gastroesophageal reflux disease) 04/04/2017  .  Advanced care planning/counseling discussion 03/29/2017  . DM type 2 with diabetic peripheral neuropathy ([place]) 09/30/2016  . Rash 09/24/2016  . Insomnia 12/24/2015  . Hyperlipidemia associated with type 2 diabetes mellitus ([place]) 09/23/2015  . Bilateral carotid artery stenosis 09/11/2015  . Atherosclerosis of abdominal aorta ([place]) 08/26/2015  . Migraine headache 08/21/2015  . Chronic obstructive pulmonary disease ([place]) 04/25/2015  . Hypertension associated with diabetes ([place]) 04/25/2015  . DDD (degenerative disc disease), cervical 03/27/2015  . DDD (degenerative disc disease), lumbar 03/27/2015  . Cervical post-laminectomy syndrome 03/27/2015  . Bilateral occipital neuralgia 03/27/2015  . Pancreatic insufficiency 01/28/2015  . Chronic back pain 12/24/2014    07/24/20-Received phone call from [name]. who states he just spoke with his endocrinology office this morning notifying him of a new Rx order of Novolog was sent to [facility] and should be receiving refills within 2 weeks. Patient also stated and confirmed he did pick up samples of Tresiba and Ozempic on Monday Oct. 4. Patient did not voice any other concerns or questions at this time.    Follow-Up:  Patient Assistance Coordination and Pharmacist Review-Check back with Dr. Quin Hoop office for Novolog status.   Raynelle Highland, [place] Assistant [phone]

## 2020-07-24 NOTE — Telephone Encounter (Signed)
Patient notified of message below. I have not seen a fax yet for the Novolog but will keep a eye out for it
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
## 2020-07-26 ENCOUNTER — Ambulatory Visit: Payer: PPO | Admitting: Nurse Practitioner

## 2020-07-27 DIAGNOSIS — G894 Chronic pain syndrome: Secondary | ICD-10-CM | POA: Diagnosis not present

## 2020-07-29 ENCOUNTER — Other Ambulatory Visit: Payer: Self-pay | Admitting: Orthopedic Surgery

## 2020-07-29 DIAGNOSIS — M1711 Unilateral primary osteoarthritis, right knee: Secondary | ICD-10-CM

## 2020-07-29 DIAGNOSIS — M25561 Pain in right knee: Secondary | ICD-10-CM | POA: Diagnosis not present

## 2020-07-30 ENCOUNTER — Other Ambulatory Visit: Payer: Self-pay | Admitting: Physician Assistant

## 2020-07-30 ENCOUNTER — Other Ambulatory Visit: Payer: Self-pay | Admitting: Internal Medicine

## 2020-07-30 NOTE — Telephone Encounter (Signed)
Ok to refill. Sent.

## 2020-07-30 NOTE — Telephone Encounter (Signed)
Please advise If ok to refill Benazepril 20 mg tablet qd. Last filled by Historical provider.

## 2020-07-31 ENCOUNTER — Other Ambulatory Visit: Payer: Self-pay

## 2020-07-31 ENCOUNTER — Other Ambulatory Visit: Payer: Self-pay | Admitting: Internal Medicine

## 2020-07-31 DIAGNOSIS — E1142 Type 2 diabetes mellitus with diabetic polyneuropathy: Secondary | ICD-10-CM

## 2020-07-31 MED ORDER — DULOXETINE HCL 60 MG PO CPEP: 60.0000 mg | ORAL_CAPSULE | Freq: Every day | ORAL | 4 refills | Status: DC

## 2020-08-07 ENCOUNTER — Encounter: Payer: Self-pay | Admitting: Nurse Practitioner

## 2020-08-07 ENCOUNTER — Ambulatory Visit: Payer: PPO

## 2020-08-07 ENCOUNTER — Other Ambulatory Visit: Payer: Self-pay

## 2020-08-07 ENCOUNTER — Encounter: Payer: PPO | Admitting: Nurse Practitioner

## 2020-08-07 VITALS — BP 109/63 | HR 89 | Temp 98.3°F | Resp 16 | Wt 250.0 lb

## 2020-08-07 DIAGNOSIS — M47897 Other spondylosis, lumbosacral region: Secondary | ICD-10-CM | POA: Diagnosis not present

## 2020-08-07 DIAGNOSIS — M179 Osteoarthritis of knee, unspecified: Secondary | ICD-10-CM | POA: Diagnosis not present

## 2020-08-07 DIAGNOSIS — G894 Chronic pain syndrome: Secondary | ICD-10-CM | POA: Diagnosis not present

## 2020-08-07 DIAGNOSIS — M169 Osteoarthritis of hip, unspecified: Secondary | ICD-10-CM | POA: Diagnosis not present

## 2020-08-07 DIAGNOSIS — M79669 Pain in unspecified lower leg: Secondary | ICD-10-CM | POA: Diagnosis not present

## 2020-08-07 DIAGNOSIS — M48062 Spinal stenosis, lumbar region with neurogenic claudication: Secondary | ICD-10-CM | POA: Diagnosis not present

## 2020-08-07 DIAGNOSIS — R609 Edema, unspecified: Secondary | ICD-10-CM | POA: Diagnosis not present

## 2020-08-07 DIAGNOSIS — M25519 Pain in unspecified shoulder: Secondary | ICD-10-CM | POA: Diagnosis not present

## 2020-08-07 DIAGNOSIS — M5136 Other intervertebral disc degeneration, lumbar region: Secondary | ICD-10-CM | POA: Diagnosis not present

## 2020-08-07 DIAGNOSIS — M4807 Spinal stenosis, lumbosacral region: Secondary | ICD-10-CM | POA: Diagnosis not present

## 2020-08-07 DIAGNOSIS — G8929 Other chronic pain: Secondary | ICD-10-CM | POA: Diagnosis not present

## 2020-08-07 DIAGNOSIS — M4726 Other spondylosis with radiculopathy, lumbar region: Secondary | ICD-10-CM | POA: Diagnosis not present

## 2020-08-07 DIAGNOSIS — M25559 Pain in unspecified hip: Secondary | ICD-10-CM | POA: Diagnosis not present

## 2020-08-07 DIAGNOSIS — M792 Neuralgia and neuritis, unspecified: Secondary | ICD-10-CM | POA: Diagnosis not present

## 2020-08-07 NOTE — Progress Notes (Signed)
Error, patient was to see CCM PharmD Birdena Crandall and not PCP.

## 2020-08-08 ENCOUNTER — Encounter: Payer: Self-pay | Admitting: Podiatry

## 2020-08-08 ENCOUNTER — Telehealth: Payer: Self-pay | Admitting: Internal Medicine

## 2020-08-08 ENCOUNTER — Ambulatory Visit: Payer: PPO | Admitting: Podiatry

## 2020-08-08 DIAGNOSIS — E1142 Type 2 diabetes mellitus with diabetic polyneuropathy: Secondary | ICD-10-CM | POA: Diagnosis not present

## 2020-08-08 DIAGNOSIS — M79674 Pain in right toe(s): Secondary | ICD-10-CM

## 2020-08-08 DIAGNOSIS — M79675 Pain in left toe(s): Secondary | ICD-10-CM

## 2020-08-08 DIAGNOSIS — B351 Tinea unguium: Secondary | ICD-10-CM | POA: Diagnosis not present

## 2020-08-08 NOTE — Telephone Encounter (Signed)
Left message for patient to return my call.

## 2020-08-08 NOTE — Progress Notes (Signed)
This patient returns to my office for at risk foot care.  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  This patient is unable to cut nails himself since the patient cannot reach his nails.These nails are painful walking and wearing shoes.  This patient presents for at risk foot care today.  General Appearance  Alert, conversant and in no acute stress.  Vascular  Dorsalis pedis and posterior tibial  pulses are palpable  bilaterally.  Capillary return is within normal limits  bilaterally. Temperature is within normal limits  bilaterally.  Neurologic  Senn-Weinstein monofilament wire test within normal limits  bilaterally. Muscle power within normal limits bilaterally.  Nails Thick disfigured discolored nails with subungual debris  from hallux to fifth toes bilaterally. No evidence of bacterial infection or drainage bilaterally.  Orthopedic  No limitations of motion  feet .  No crepitus or effusions noted.  No bony pathology or digital deformities noted.  Skin  normotropic skin with no porokeratosis noted bilaterally.  No signs of infections or ulcers noted.     Onychomycosis  Pain in right toes  Pain in left toes  Consent was obtained for treatment procedures.   Mechanical debridement of nails 1-5  bilaterally performed with a nail nipper.  Filed with dremel without incident.    Return office visit   10 weeks                   Told patient to return for periodic foot care and evaluation due to potential at risk complications.   Gardiner Barefoot DPM

## 2020-08-08 NOTE — Telephone Encounter (Signed)
Pt called says returning call to [place]. Please call pt.

## 2020-08-08 NOTE — Telephone Encounter (Signed)
Pt called says NOVOLOG dosage 18 am/ 16 at lunch/ 16 at night. Will be Out of it on this Saturday & TRESIBA  every night out of it on Monday. Humalog Kwikpen once a week pt has 4 left. Pt usually picks theses meds up at the office.  Pt ph [phone].

## 2020-08-09 NOTE — Telephone Encounter (Signed)
Patient called back and notified of Dr [facility] comments. Patient is aware to pick up sample Novolog at the office.  I have also re-fax order as well.

## 2020-08-12 ENCOUNTER — Other Ambulatory Visit: Payer: Self-pay

## 2020-08-12 ENCOUNTER — Ambulatory Visit
Admission: RE | Admit: 2020-08-12 | Discharge: 2020-08-12 | Disposition: A | Discharge status: Home / Self Care | Payer: PPO | Source: Ambulatory Visit | Attending: Orthopedic Surgery | Admitting: Orthopedic Surgery

## 2020-08-12 DIAGNOSIS — M19071 Primary osteoarthritis, right ankle and foot: Secondary | ICD-10-CM | POA: Diagnosis not present

## 2020-08-12 DIAGNOSIS — Z01818 Encounter for other preprocedural examination: Secondary | ICD-10-CM | POA: Diagnosis not present

## 2020-08-12 DIAGNOSIS — M25461 Effusion, right knee: Secondary | ICD-10-CM | POA: Diagnosis not present

## 2020-08-12 DIAGNOSIS — M1711 Unilateral primary osteoarthritis, right knee: Secondary | ICD-10-CM | POA: Insufficient documentation

## 2020-08-17 ENCOUNTER — Encounter: Payer: Self-pay | Admitting: Nurse Practitioner

## 2020-08-21 ENCOUNTER — Ambulatory Visit: Payer: Self-pay | Admitting: Pharmacist

## 2020-08-21 DIAGNOSIS — E1165 Type 2 diabetes mellitus with hyperglycemia: Secondary | ICD-10-CM

## 2020-08-21 DIAGNOSIS — J439 Emphysema, unspecified: Secondary | ICD-10-CM

## 2020-08-21 NOTE — Telephone Encounter (Addendum)
Almyra Free from [facility] calling regarding patient's insulins from [place]. Also patient is also with Almyra Free while on the phone with me.  Inform Almyra Free and patient that we do not any shipment for patient in the office. Told them that it is best for patient to Novo patient assistance.  Almyra Free asked if I can re-fax the refill/reorder form as requested.  Almyra Free verbalized understanding.

## 2020-08-21 NOTE — Progress Notes (Signed)
Chronic Care Management Pharmacy  Name: Edward Schneider.  MRN: [id number] DOB: 1956/02/26   Chief Complaint/ HPI  Edward Schneider.,  64 y.o. , male presents for their Follow-Up CCM visit with the clinical pharmacist In office.  PCP : [name], NP Patient Care Team: [name], NP as PCP - General (Nurse Practitioner) End, Harrell Gave, MD as PCP - Cardiology (Cardiology) Vladimir Faster, [facility] (Pharmacist)  Their chronic conditions include: Hypertension, Hyperlipidemia, Diabetes, Coronary Artery Disease, GERD, COPD, BPH and DDD/chronic pain    Consult Visit: 07/22/20- Dr. Kelton Pillar, endocrinology -  Continue Tyler Aas, Novolog, and Ozempic  Allergies  Allergen Reactions  . Bee Pollen Anaphylaxis    Died 3 times when stung by bees. Carries Epi-pen at all times.   . Bee Venom Anaphylaxis  . Crestor [Rosuvastatin Calcium] Shortness Of Breath and Swelling  . Fentanyl Itching and Hives    blisters Patch  . Gabapentin Diarrhea    Severe diarrhea which caused incontinence, loss of appetite and weight loss.  . Shellfish Allergy Anaphylaxis and Swelling    Shrimp causes throat to swell and tingling in tongue. Can eat other white fish, crabcakes, and oysters.  . Furosemide Nausea And Vomiting  . Buprenorphine Hcl Itching  . Simvastatin Diarrhea    Medications: Outpatient Encounter Medications as of 08/21/2020  Medication Sig Note  . albuterol (VENTOLIN HFA) 108 (90 Base) MCG/ACT inhaler Inhale 2 puffs into the lungs every 6 (six) hours as needed for wheezing or shortness of breath.   . Ascorbic Acid (VITAMIN C) 1000 MG tablet Take 1,000 mg by mouth daily.    Marland Kitchen aspirin EC 81 MG EC tablet Take 1 tablet (81 mg total) by mouth daily.   Marland Kitchen atorvastatin (LIPITOR) 80 MG tablet Take 1 tablet (80 mg total) by mouth daily.   . benazepril (LOTENSIN) 20 MG tablet Take 20 mg by mouth daily.    . calcium carbonate (OS-CAL) 600 MG TABS tablet Take 600 mg by mouth daily.    .  cephALEXin (KEFLEX) 500 MG capsule Take 500 mg by mouth 2 (two) times daily.   . cetirizine (ZYRTEC) 10 MG tablet Take 10 mg by mouth daily.   . Cholecalciferol (VITAMIN D3) 25 MCG (1000 UT) CHEW Chew 1 capsule by mouth daily.    Marland Kitchen CINNAMON PO Take 1,000 mg by mouth 2 (two) times daily.    . clobetasol cream (TEMOVATE) 6.29 % Apply 1 application topically 2 (two) times daily.   . clopidogrel (PLAVIX) 75 MG tablet Take 1 tablet by mouth once daily   . Continuous Blood Gluc Sensor (FREESTYLE LIBRE 14 DAY SENSOR) MISC See admin instructions.    . cyclobenzaprine (FLEXERIL) 10 MG tablet Take 1 tablet (10 mg total) by mouth 3 (three) times daily as needed for muscle spasms.   . diclofenac Sodium (VOLTAREN) 1 % GEL Apply 2 g topically 4 (four) times daily.   . diphenhydrAMINE (BENADRYL) 25 mg capsule Take 25 mg by mouth 2 (two) times daily.    Marland Kitchen doxepin (SINEQUAN) 25 MG capsule Take 50 mg by mouth at bedtime.    . DULoxetine (CYMBALTA) 30 MG capsule Take 30 mg by mouth 2 (two) times daily.    . DULoxetine (CYMBALTA) 60 MG capsule Take 1 capsule (60 mg total) by mouth at bedtime.   Marland Kitchen EPINEPHrine 0.3 mg/0.3 mL IJ SOAJ injection Inject 0.3 mLs (0.3 mg total) into the muscle as needed for anaphylaxis.   . Ginger, Zingiber  officinalis, (GINGER PO) Take 1 Dose by mouth daily.    . Ginseng 100 MG CAPS Take by mouth.    . insulin degludec (TRESIBA FLEXTOUCH) 100 UNIT/ML FlexTouch Pen Inject 28 Units into the skin daily.   . isosorbide mononitrate (IMDUR) 60 MG 24 hr tablet Take by mouth.   . Multiple Vitamins-Minerals (HAIR SKIN AND NAILS FORMULA PO) Take 1 capsule by mouth daily.    . mupirocin ointment (BACTROBAN) 2 % Apply 1 application topically daily.    Marland Kitchen NARCAN 4 MG/0.1ML LIQD nasal spray kit Place 0.4 mg into the nose once.    . nitroGLYCERIN (NITROSTAT) 0.4 MG SL tablet Place under the tongue.   . Omega-3 1000 MG CAPS Take 1,000 mg by mouth daily.    Marland Kitchen oxyCODONE (ROXICODONE) 15 MG immediate release  tablet TAKE 1/2 TO 1 (ONE HALF TO ONE) TABLET BY MOUTH FOUR TO SIX TIMES DAILY IF TOLERATED NOTE TABLET IS 15 MG 09/26/2019: Taking 6 tab per day  . pantoprazole (PROTONIX) 40 MG tablet Take 1 tablet by mouth twice daily   . pregabalin (LYRICA) 50 MG capsule TAKE 1 CAPSULE BY MOUTH THREE TIMES DAILY   . Semaglutide,0.25 or 0.5MG/DOS, (OZEMPIC, 0.25 OR 0.5 MG/DOSE,) 2 MG/1.5ML SOPN Inject 0.5 mg into the skin once a week.    . sucralfate (CARAFATE) 1 g tablet TAKE 1 TABLET BY MOUTH 4 TIMES DAILY... WITH MEALS AND AT BEDTIME   . Tiotropium Bromide-Olodaterol (STIOLTO RESPIMAT) 2.5-2.5 MCG/ACT AERS Inhale 2 puffs into the lungs daily.   . traZODone (DESYREL) 100 MG tablet Take 1 tablet (100 mg total) by mouth at bedtime as needed for sleep.   Marland Kitchen triamcinolone cream (KENALOG) 0.1 % Apply 1 application topically 2 (two) times daily.   . vitamin A 10000 UNIT capsule Take 10,000 Units by mouth daily.    . vitamin B-12 (CYANOCOBALAMIN) 1000 MCG tablet Take 1,000 mcg by mouth daily.    . Vitamin E 400 units TABS Take 400 Units by mouth daily.    . [DISCONTINUED] insulin lispro (HUMALOG KWIKPEN) 100 UNIT/ML KwikPen Inject 0.16 mLs (16 Units total) into the skin 2 (two) times daily with breakfast and lunch AND 0.18 mLs (18 Units total) daily with supper.   . [DISCONTINUED] Insulin NPH Isophane & Regular (NOVOLIN 70/30 FLEXPEN [place]) Inject into the skin. 18 units with breakfast, 16 units with lunch, 16 units with dinner     No facility-administered encounter medications on file as of 08/21/2020.    Wt Readings from Last 3 Encounters:  08/07/20 250 lb (113.4 kg)  07/22/20 246 lb 6.4 oz (111.8 kg)  07/17/20 242 lb 9.6 oz (110 kg)    Current Diagnosis/Assessment:    Goals Addressed            This Visit's Progress   . Pharmacy Care Plan       CARE PLAN ENTRY (see longitudinal plan of care for additional care plan information)  Current Barriers:  . Chronic Disease Management support, education, and  care coordination needs related to Hypertension, Hyperlipidemia, Diabetes, Coronary Artery Disease, GERD, COPD, BPH, and Chronic pain     Diabetes Lab Results  Component Value Date/Time   HGBA1C 10.3 (H) 05/29/2020 03:27 PM   HGBA1C 9.5 (H) 05/23/2020 08:33 AM   HGBA1C 9.0 (A) 01/25/2020 09:22 AM   HGBA1C 7.9 (A) 10/26/2019 10:00 AM   HGBA1C 9.3 (H) 08/03/2018 03:35 PM   . Pharmacist Clinical Goal(s): o Over the next 60 days, patient will work  with PharmD and providers to achieve A1c goal <7% . Current regimen:  . Tresiba 28 units qpm  . Novolog 18 units ac breakfast, 16 units ac lunch and dinner . Ozempic 0.5 mg weekly . Interventions: o Called Endocrinology office to help patient obtain meds o Will reapply for PAP o Provided diet and exercise counseling o Reviewed medication refill requests  Reviewed goal glucose readings for an A1c of <7%, we want to see fasting sugars <130 and 2 hour after meal sugars <180.   Marland Kitchen Patient self care activities - Over the next 60 days, patient will: o Check blood sugar 3-4 times daily, document, and provide at future appointments o Contact provider with any episodes of hypoglycemia o Provide required documentation for PAP o Bring glucometer to scheduled appointments o Focus on diet and exercise COPD . Pharmacist Clinical Goal(s) o Over the next 60  days, patient will work with PharmD and providers to optimize therapy and minimize symptoms . Current regimen:  . Albuterol 2 puffs q6h prn . Stiolto 2 puffs daily . Interventions: o Reviewed inhaler technique o Will renew Stiolto PAP . Patient self care activities - Over the next 60 days, patient will: o Attend all scheduled appointments o Provide required portion of PAP documentation  Medication management . Pharmacist Clinical Goal(s): o Over the next 60 days, patient will work with PharmD and providers to achieve optimal medication adherence . Current pharmacy:  [facility] . Interventions o Comprehensive medication review performed. o Continue current medication management strategy . Patient self care activities - Over the next 60 days, patient will: o Focus on medication adherence by fill dates o Take medications as prescribed o Report any questions or concerns to PharmD and/or provider(s)  Initial goal documentation        Diabetes   A1c goal <7%  Recent Relevant Labs: Lab Results  Component Value Date/Time   HGBA1C 10.3 (H) 05/29/2020 03:27 PM   HGBA1C 9.5 (H) 05/23/2020 08:33 AM   HGBA1C 9.0 (A) 01/25/2020 09:22 AM   HGBA1C 7.9 (A) 10/26/2019 10:00 AM   HGBA1C 9.3 (H) 08/03/2018 03:35 PM   GFR 66.47 01/24/2019 02:39 PM   MICROALBUR 80 (H) 05/23/2020 08:34 AM   MICROALBUR 80 (H) 04/27/2018 03:48 PM    Last diabetic Eye exam:  Lab Results  Component Value Date/Time   HMDIABEYEEXA Retinopathy (A) 02/21/2020 12:00 AM    Last diabetic Foot exam: No results found for: HMDIABFOOTEX   Checking BG: 4+ times daily  Uses Freestyle Libre  Recent FBG Readings: 91- 163 Recent pre-meal BG readings: 106, 127 Recent 2hr PP BG readings:  287, 301, 360 Recent HS BG readings: 133  Patient has failed these meds in past: Metformin-diarrhea, Januvia, cost Patient is currently uncontrolled on the following medications: . Tyler Aas 28 units qpm (patient has been taking 14 units for fear of running out) . Novolog 18 units ac breakfast, 16 units ac lunch and dinner . Ozempic 0.5 mg weekly  We discussed: Patient approved for NOVO PAP for Ozempic, Tresiba and Novolog through 09/17/20. Patient follows with endocrinology and meds and needles must be requested and shipped to prescribers office. Patient explains that he has not received his full supply of meds and needles since April. Reports he was given 3 single pen samples of Tresiba  instead of his patient specific 3 box of 5 pens from endo office in October and has been reducing his doses afraid of  running out. I called Novo and was unable to  speak with representative after holding for 40 minutes. I called [facility] and asked to speak with someone regarding patient assistance medications. The nurse who assisted me states she has only been in the office for two and all the staff are new. The log for PAP medications did not indicate how many pens were received or dispensed to Mr. Ocallaghan. Tresiba samples unavailable in our office and unable to provide [place] as substitute due to the concentration. Mr. Bellina presented a letter from [facility] stating his refill request for Ozempic could not be processed due to incomplete/or inaccurate information. He does have adequate supply of Ozempic and denies any missed doses.  He reports not receiving his Novolog from PAP and has been using samples from Endo office. He has adequate supply to last a couple of weeks. Have asked CPA to follow up with endocrinology and Novo tomorrow. We discussed diet and exercise. Patient reports having potato soup and a ham and [place] sub for lunch today at 12:30. He checked his BG during our visit ~3 hours after and it was 287. I counseled on avoidance of starchy foods, breads and regular sodas. Patient states he attended at diabetic nutrition class and doesn't always eat as he should.  He reports an epidsode of "being out of it" on Sunday after pressure washing his house. He did not check his BG and "went to bed"  He reports his BG was 360 later that evening but cannot remember if this was after he ate.  He did not bring his meter to today's appointment. He has upcoming appointment with Jolene on 11/5.  Plan CPA to follow up with Novo PAP and  Endo. Will reapply for patient assistance.  Continue current medications   COPD/Emphysema   Last spirometry score: 12/06/19  Gold Grade: Gold 2 (FEV1 50-79%) Current COPD Classification:  A (low sx, <2 exacerbations/yr)  Lab Results  Component Value Date/Time   EOSPCT 3  08/22/2019 06:41 PM   EOSPCT 0.9 02/28/2014 06:45 PM   EOSABS 0.4 05/23/2020 08:37 AM   EOSABS 0.1 02/28/2014 06:45 PM    Patient has failed these meds in past: NA Patient is currently controlled on the following medications:  . Albuterol 2 puffs q6h prn . Stiolto 2 puffs daily  Using maintenance inhaler regularly? Yes Frequency of rescue inhaler use:  1-2x per week  We discussed:  proper inhaler technique. Patient follows with pulmonology. Receives Stiolto PAP. Has adequate supply and denies any missed doses.  Will initiate renewal.  Plan  Continue current medications    Medication Management   Pt uses [facility] for all medications Uses pill box? Yes Pt endorses 85 % compliance   Plan  Continue current medication management strategy    Follow up: 2 month phone visit  Junita Push. Kenton Kingfisher PharmD, [facility] [phone]

## 2020-08-22 ENCOUNTER — Telehealth: Payer: Self-pay | Admitting: Pharmacist

## 2020-08-22 ENCOUNTER — Telehealth: Payer: Self-pay

## 2020-08-22 NOTE — Chronic Care Management (AMB) (Signed)
Chronic Care Management Pharmacy Assistant   Name: Edward Schneider.  MRN: [id number] DOB: [birth year]  Reason for Encounter: Care Coordination for Diabetic Medication.     PCP : [name], NP  Allergies:   Allergies  Allergen Reactions  . Bee Pollen Anaphylaxis    Died 3 times when stung by bees. Carries Epi-pen at all times.   . Bee Venom Anaphylaxis  . Crestor [Rosuvastatin Calcium] Shortness Of Breath and Swelling  . Fentanyl Itching and Hives    blisters Patch  . Gabapentin Diarrhea    Severe diarrhea which caused incontinence, loss of appetite and weight loss.  . Shellfish Allergy Anaphylaxis and Swelling    Shrimp causes throat to swell and tingling in tongue. Can eat other white fish, crabcakes, and oysters.  . Furosemide Nausea And Vomiting  . Buprenorphine Hcl Itching  . Simvastatin Diarrhea    Medications: Outpatient Encounter Medications as of 08/22/2020  Medication Sig Note  . albuterol (VENTOLIN HFA) 108 (90 Base) MCG/ACT inhaler Inhale 2 puffs into the lungs every 6 (six) hours as needed for wheezing or shortness of breath.   . Ascorbic Acid (VITAMIN C) 1000 MG tablet Take 1,000 mg by mouth daily.    Marland Kitchen aspirin EC 81 MG EC tablet Take 1 tablet (81 mg total) by mouth daily.   Marland Kitchen atorvastatin (LIPITOR) 80 MG tablet Take 1 tablet (80 mg total) by mouth daily.   . benazepril (LOTENSIN) 20 MG tablet Take 20 mg by mouth daily.    . calcium carbonate (OS-CAL) 600 MG TABS tablet Take 600 mg by mouth daily.    . cephALEXin (KEFLEX) 500 MG capsule Take 500 mg by mouth 2 (two) times daily.   . cetirizine (ZYRTEC) 10 MG tablet Take 10 mg by mouth daily.   . Cholecalciferol (VITAMIN D3) 25 MCG (1000 UT) CHEW Chew 1 capsule by mouth daily.    Marland Kitchen CINNAMON PO Take 1,000 mg by mouth 2 (two) times daily.    . clobetasol cream (TEMOVATE) 9.93 % Apply 1 application topically 2 (two) times daily.   . clopidogrel (PLAVIX) 75 MG tablet Take 1 tablet by mouth once daily     . Continuous Blood Gluc Sensor (FREESTYLE LIBRE 14 DAY SENSOR) MISC See admin instructions.    . cyclobenzaprine (FLEXERIL) 10 MG tablet Take 1 tablet (10 mg total) by mouth 3 (three) times daily as needed for muscle spasms.   . diclofenac Sodium (VOLTAREN) 1 % GEL Apply 2 g topically 4 (four) times daily.   . diphenhydrAMINE (BENADRYL) 25 mg capsule Take 25 mg by mouth 2 (two) times daily.    Marland Kitchen doxepin (SINEQUAN) 25 MG capsule Take 50 mg by mouth at bedtime.    . DULoxetine (CYMBALTA) 30 MG capsule Take 30 mg by mouth 2 (two) times daily.    . DULoxetine (CYMBALTA) 60 MG capsule Take 1 capsule (60 mg total) by mouth at bedtime.   Marland Kitchen EPINEPHrine 0.3 mg/0.3 mL IJ SOAJ injection Inject 0.3 mLs (0.3 mg total) into the muscle as needed for anaphylaxis.   . Ginger, Zingiber officinalis, (GINGER PO) Take 1 Dose by mouth daily.    . Ginseng 100 MG CAPS Take by mouth.    . insulin degludec (TRESIBA FLEXTOUCH) 100 UNIT/ML FlexTouch Pen Inject 28 Units into the skin daily.   . isosorbide mononitrate (IMDUR) 60 MG 24 hr tablet Take by mouth.   . Multiple Vitamins-Minerals (HAIR SKIN AND NAILS FORMULA PO) Take 1 capsule  by mouth daily.    . mupirocin ointment (BACTROBAN) 2 % Apply 1 application topically daily.    Marland Kitchen NARCAN 4 MG/0.1ML LIQD nasal spray kit Place 0.4 mg into the nose once.    . nitroGLYCERIN (NITROSTAT) 0.4 MG SL tablet Place under the tongue.   . Omega-3 1000 MG CAPS Take 1,000 mg by mouth daily.    Marland Kitchen oxyCODONE (ROXICODONE) 15 MG immediate release tablet TAKE 1/2 TO 1 (ONE HALF TO ONE) TABLET BY MOUTH FOUR TO SIX TIMES DAILY IF TOLERATED NOTE TABLET IS 15 MG 09/26/2019: Taking 6 tab per day  . pantoprazole (PROTONIX) 40 MG tablet Take 1 tablet by mouth twice daily   . pregabalin (LYRICA) 50 MG capsule TAKE 1 CAPSULE BY MOUTH THREE TIMES DAILY   . Semaglutide,0.25 or 0.5MG/DOS, (OZEMPIC, 0.25 OR 0.5 MG/DOSE,) 2 MG/1.5ML SOPN Inject 0.5 mg into the skin once a week.    . sucralfate (CARAFATE) 1  g tablet TAKE 1 TABLET BY MOUTH 4 TIMES DAILY... WITH MEALS AND AT BEDTIME   . Tiotropium Bromide-Olodaterol (STIOLTO RESPIMAT) 2.5-2.5 MCG/ACT AERS Inhale 2 puffs into the lungs daily.   . traZODone (DESYREL) 100 MG tablet Take 1 tablet (100 mg total) by mouth at bedtime as needed for sleep.   Marland Kitchen triamcinolone cream (KENALOG) 0.1 % Apply 1 application topically 2 (two) times daily.   . vitamin A 10000 UNIT capsule Take 10,000 Units by mouth daily.    . vitamin B-12 (CYANOCOBALAMIN) 1000 MCG tablet Take 1,000 mcg by mouth daily.    . Vitamin E 400 units TABS Take 400 Units by mouth daily.     No facility-administered encounter medications on file as of 08/22/2020.    Current Diagnosis: Patient Active Problem List   Diagnosis Date Noted  . Family history of malignant neoplasm of gastrointestinal tract   . Depression, major, single episode, moderate ([place]) 03/06/2020  . BMI 37.0-37.9, adult 03/06/2020  . Uncontrolled type 2 diabetes mellitus with hyperglycemia ([place]) 10/29/2019  . Coronary artery disease of native artery of native heart with stable angina pectoris ([place]) 08/31/2019  . Pain due to onychomycosis of toenails of both feet 08/10/2019  . Morbid obesity ([place]) 05/17/2019  . BPH (benign prostatic hyperplasia) 04/27/2018  . Knee pain, right 02/08/2018  . Sleep apnea 02/08/2018  . GERD (gastroesophageal reflux disease) 04/04/2017  . Advanced care planning/counseling discussion 03/29/2017  . DM type 2 with diabetic peripheral neuropathy ([place]) 09/30/2016  . Insomnia 12/24/2015  . Hyperlipidemia associated with type 2 diabetes mellitus ([place]) 09/23/2015  . Bilateral carotid artery stenosis 09/11/2015  . Atherosclerosis of abdominal aorta ([place]) 08/26/2015  . Migraine headache 08/21/2015  . Emphysema lung ([place]) 04/25/2015  . Hypertension associated with diabetes ([place]) 04/25/2015  . DDD (degenerative disc disease), cervical 03/27/2015  . DDD (degenerative disc disease), lumbar 03/27/2015    . Cervical post-laminectomy syndrome 03/27/2015  . Bilateral occipital neuralgia 03/27/2015  . Pancreatic insufficiency 01/28/2015  . Chronic back pain 12/24/2014    Goals Addressed   None    08/21/2020: The patient had a CPP follow-up visit with Birdena Crandall, [facility] to discuss chronic care management and needs. During the visit on 08-21-20 patient-reported, he needed his diabetic medications. The patient explained to CPP,  he went to his endocrinologist's office back in October and picked up some samples, but has recently ran out of medications. Patient-reported he did reach out to [facility] but was provided no assistance or answers related to his diabetic medication needs. CPA  was instructed by CPP to research the situation and assist the patient with obtaining diabetic medications. The patient reported he needed [place], Novolog, and Ozempic.   08/22/2020 9:45 am: Called patient assistance [employer] where the patient has been approved to receive patient assistance for [place], Novolog, and Ozempic diabetic medications. Spoke to a call representative who explained Novo received a refill request for the patient on 07-31-20 from [facility] endocrinology. Per representative, his refill request was on "hold" due to [employer] needing order clarification for the patient. Per representative several attempts had been made by Novo to clarify the patient's orders and refill. For the patient's medications to be filled and shipped the prescribing office would have to fax in clarifying orders for the patient. Novo explained it would take maybe 2-3 business days to process the order. Explained to the representative that the patient was reporting he was currently out of all his diabetic medications. Requested emergency assistance. Unfortunately, the patient was unable to receive emergency assistance due to having an emergency override for the same medications earlier in the year. Verbalized  understanding of all information given.   After several attempts of trying to speak to someone at [facility], I was able to speak with a [place] named Larene Beach. I explained to Larene Beach what the patient reported to our provider. Also stated since the patient is reporting he is out of all of his diabetic injectable medications we wanted to ensure that necessary steps were going to be provided and followed up with the patient. Larene Beach verbalized an understanding of all information given. Reported to Larene Beach the conversation with [employer] and what they were requesting to complete the order. Larene Beach again verbalized understanding. Larene Beach also stated that the patient does have a history of non-adherence to the currently prescribed diabetic medications, attending follow-up appointments, and returning phone calls. Per Larene Beach, she would "handle" the rest of the patient's needs. She stated she did not need any other type of assistance from the PCP office. Attempted to update patient on the status of this matter, but no answer or return call back from the patient. CPP Birdena Crandall made aware and verbalized understanding of information.     Cloretta Ned, LPN Clinical Pharmacist Assistant  [phone]    Follow-Up:  Pharmacist Review

## 2020-08-22 NOTE — Telephone Encounter (Signed)
Please review patients medication list and advise current diabetic medications.  Unclear what patient should be taking note versus list.  Please advise.   Tresiba 28 units daily   Novolog 18 units with Breakfast and 16 units with Lunch and   supper  Ozempic 0.5 mg weekly   Patient assistance is behind I am waiting on a call back to clarify what they need.  In the mean time is it ok to give samples to patient?

## 2020-08-22 NOTE — Chronic Care Management (AMB) (Signed)
Chronic Care Management Pharmacy Assistant   Name: Edward Schneider.  MRN: [id number] DOB: 16-Nov-1955  Reason for Encounter: [employer] Patient Assistance Program Renewal for Stiolto    PCP : [name], NP  Allergies:   Allergies  Allergen Reactions  . Bee Pollen Anaphylaxis    Died 3 times when stung by bees. Carries Epi-pen at all times.   . Bee Venom Anaphylaxis  . Crestor [Rosuvastatin Calcium] Shortness Of Breath and Swelling  . Fentanyl Itching and Hives    blisters Patch  . Gabapentin Diarrhea    Severe diarrhea which caused incontinence, loss of appetite and weight loss.  . Shellfish Allergy Anaphylaxis and Swelling    Shrimp causes throat to swell and tingling in tongue. Can eat other white fish, crabcakes, and oysters.  . Furosemide Nausea And Vomiting  . Buprenorphine Hcl Itching  . Simvastatin Diarrhea    Medications: Outpatient Encounter Medications as of 08/22/2020  Medication Sig Note  . albuterol (VENTOLIN HFA) 108 (90 Base) MCG/ACT inhaler Inhale 2 puffs into the lungs every 6 (six) hours as needed for wheezing or shortness of breath.   . Ascorbic Acid (VITAMIN C) 1000 MG tablet Take 1,000 mg by mouth daily.    Marland Kitchen aspirin EC 81 MG EC tablet Take 1 tablet (81 mg total) by mouth daily.   Marland Kitchen atorvastatin (LIPITOR) 80 MG tablet Take 1 tablet (80 mg total) by mouth daily.   . benazepril (LOTENSIN) 20 MG tablet Take 20 mg by mouth daily.    . calcium carbonate (OS-CAL) 600 MG TABS tablet Take 600 mg by mouth daily.    . cephALEXin (KEFLEX) 500 MG capsule Take 500 mg by mouth 2 (two) times daily.   . cetirizine (ZYRTEC) 10 MG tablet Take 10 mg by mouth daily.   . Cholecalciferol (VITAMIN D3) 25 MCG (1000 UT) CHEW Chew 1 capsule by mouth daily.    Marland Kitchen CINNAMON PO Take 1,000 mg by mouth 2 (two) times daily.    . clobetasol cream (TEMOVATE) 7.35 % Apply 1 application topically 2 (two) times daily.   . clopidogrel (PLAVIX) 75 MG tablet Take 1 tablet by mouth once  daily   . Continuous Blood Gluc Sensor (FREESTYLE LIBRE 14 DAY SENSOR) MISC See admin instructions.    . cyclobenzaprine (FLEXERIL) 10 MG tablet Take 1 tablet (10 mg total) by mouth 3 (three) times daily as needed for muscle spasms.   . diclofenac Sodium (VOLTAREN) 1 % GEL Apply 2 g topically 4 (four) times daily.   . diphenhydrAMINE (BENADRYL) 25 mg capsule Take 25 mg by mouth 2 (two) times daily.    Marland Kitchen doxepin (SINEQUAN) 25 MG capsule Take 50 mg by mouth at bedtime.    . DULoxetine (CYMBALTA) 30 MG capsule Take 30 mg by mouth 2 (two) times daily.    . DULoxetine (CYMBALTA) 60 MG capsule Take 1 capsule (60 mg total) by mouth at bedtime.   Marland Kitchen EPINEPHrine 0.3 mg/0.3 mL IJ SOAJ injection Inject 0.3 mLs (0.3 mg total) into the muscle as needed for anaphylaxis.   . Ginger, Zingiber officinalis, (GINGER PO) Take 1 Dose by mouth daily.    . Ginseng 100 MG CAPS Take by mouth.    . insulin degludec (TRESIBA FLEXTOUCH) 100 UNIT/ML FlexTouch Pen Inject 28 Units into the skin daily.   . insulin lispro (HUMALOG KWIKPEN) 100 UNIT/ML KwikPen Inject 0.16 mLs (16 Units total) into the skin 2 (two) times daily with breakfast and lunch AND  0.18 mLs (18 Units total) daily with supper.   . Insulin NPH Isophane & Regular (NOVOLIN 70/30 FLEXPEN [place]) Inject into the skin. 18 units with breakfast, 16 units with lunch, 16 units with dinner    . isosorbide mononitrate (IMDUR) 60 MG 24 hr tablet Take by mouth.   . Multiple Vitamins-Minerals (HAIR SKIN AND NAILS FORMULA PO) Take 1 capsule by mouth daily.    . mupirocin ointment (BACTROBAN) 2 % Apply 1 application topically daily.    Marland Kitchen NARCAN 4 MG/0.1ML LIQD nasal spray kit Place 0.4 mg into the nose once.    . nitroGLYCERIN (NITROSTAT) 0.4 MG SL tablet Place under the tongue.   . Omega-3 1000 MG CAPS Take 1,000 mg by mouth daily.    Marland Kitchen oxyCODONE (ROXICODONE) 15 MG immediate release tablet TAKE 1/2 TO 1 (ONE HALF TO ONE) TABLET BY MOUTH FOUR TO SIX TIMES DAILY IF TOLERATED NOTE  TABLET IS 15 MG 09/26/2019: Taking 6 tab per day  . pantoprazole (PROTONIX) 40 MG tablet Take 1 tablet by mouth twice daily   . pregabalin (LYRICA) 50 MG capsule TAKE 1 CAPSULE BY MOUTH THREE TIMES DAILY   . Semaglutide,0.25 or 0.5MG/DOS, (OZEMPIC, 0.25 OR 0.5 MG/DOSE,) 2 MG/1.5ML SOPN Inject 0.5 mg into the skin once a week.    . sucralfate (CARAFATE) 1 g tablet TAKE 1 TABLET BY MOUTH 4 TIMES DAILY... WITH MEALS AND AT BEDTIME   . Tiotropium Bromide-Olodaterol (STIOLTO RESPIMAT) 2.5-2.5 MCG/ACT AERS Inhale 2 puffs into the lungs daily.   . traZODone (DESYREL) 100 MG tablet Take 1 tablet (100 mg total) by mouth at bedtime as needed for sleep.   Marland Kitchen triamcinolone cream (KENALOG) 0.1 % Apply 1 application topically 2 (two) times daily.   . vitamin A 10000 UNIT capsule Take 10,000 Units by mouth daily.    . vitamin B-12 (CYANOCOBALAMIN) 1000 MCG tablet Take 1,000 mcg by mouth daily.    . Vitamin E 400 units TABS Take 400 Units by mouth daily.     No facility-administered encounter medications on file as of 08/22/2020.    Current Diagnosis: Patient Active Problem List   Diagnosis Date Noted  . Family history of malignant neoplasm of gastrointestinal tract   . Depression, major, single episode, moderate ([place]) 03/06/2020  . BMI 37.0-37.9, adult 03/06/2020  . Uncontrolled type 2 diabetes mellitus with hyperglycemia ([place]) 10/29/2019  . Coronary artery disease of native artery of native heart with stable angina pectoris ([place]) 08/31/2019  . Pain due to onychomycosis of toenails of both feet 08/10/2019  . Morbid obesity ([place]) 05/17/2019  . BPH (benign prostatic hyperplasia) 04/27/2018  . Knee pain, right 02/08/2018  . Sleep apnea 02/08/2018  . GERD (gastroesophageal reflux disease) 04/04/2017  . Advanced care planning/counseling discussion 03/29/2017  . DM type 2 with diabetic peripheral neuropathy ([place]) 09/30/2016  . Insomnia 12/24/2015  . Hyperlipidemia associated with type 2 diabetes mellitus  ([place]) 09/23/2015  . Bilateral carotid artery stenosis 09/11/2015  . Atherosclerosis of abdominal aorta ([place]) 08/26/2015  . Migraine headache 08/21/2015  . Emphysema lung ([place]) 04/25/2015  . Hypertension associated with diabetes ([place]) 04/25/2015  . DDD (degenerative disc disease), cervical 03/27/2015  . DDD (degenerative disc disease), lumbar 03/27/2015  . Cervical post-laminectomy syndrome 03/27/2015  . Bilateral occipital neuralgia 03/27/2015  . Pancreatic insufficiency 01/28/2015  . Chronic back pain 12/24/2014    Goals Addressed   None     08/22/20- CPA called Sherrilyn Rist, MD office in [facility]; who prescribes the  patient's Stiolto medication. CPA spoke to Ms.Tonya in the front office about renewing Stiolto PAP for the patient. Ms.Tonya provided a fax number [phone] for CPA to send over PAP renewal forms to the office for Dr. Ander Slade to fill out and sign. CPA successfully faxed PAP forms to [place] attention to Dr. Ander Slade on 08/22/20 at 2:50 p.m.    Raynelle Highland, [place] Assistant [phone]    Follow-up: CPA to call Dr.Olalere, Adewale A, MD office 08/23/20 for prescriber information completion to Renew PAP for Stiolto and faxing back to CPP, Birdena Crandall as requested through a phone call with [facility].

## 2020-08-22 NOTE — Patient Instructions (Addendum)
Visit Information  It was a pleasure speaking with you today. Thank you for letting me be part of your clinical team. Please call with any questions or concerns.   Goals Addressed            This Visit's Progress   . Pharmacy Care Plan       CARE PLAN ENTRY (see longitudinal plan of care for additional care plan information)  Current Barriers:  . Chronic Disease Management support, education, and care coordination needs related to Hypertension, Hyperlipidemia, Diabetes, Coronary Artery Disease, GERD, COPD, BPH, and Chronic pain     Diabetes Lab Results  Component Value Date/Time   HGBA1C 10.3 (H) 05/29/2020 03:27 PM   HGBA1C 9.5 (H) 05/23/2020 08:33 AM   HGBA1C 9.0 (A) 01/25/2020 09:22 AM   HGBA1C 7.9 (A) 10/26/2019 10:00 AM   HGBA1C 9.3 (H) 08/03/2018 03:35 PM   . Pharmacist Clinical Goal(s): o Over the next 60 days, patient will work with PharmD and providers to achieve A1c goal <7% . Current regimen:  . Tresiba 28 units qpm  . Novolog 18 units ac breakfast, 16 units ac lunch and dinner . Ozempic 0.5 mg weekly . Interventions: o Called Endocrinology office to help patient obtain meds o Will reapply for PAP o Provided diet and exercise counseling o Reviewed medication refill requests  Reviewed goal glucose readings for an A1c of <7%, we want to see fasting sugars <130 and 2 hour after meal sugars <180.   Marland Kitchen Patient self care activities - Over the next 60 days, patient will: o Check blood sugar 3-4 times daily, document, and provide at future appointments o Contact provider with any episodes of hypoglycemia o Provide required documentation for PAP o Bring glucometer to scheduled appointments o Focus on diet and exercise COPD . Pharmacist Clinical Goal(s) o Over the next 60  days, patient will work with PharmD and providers to optimize therapy and minimize symptoms . Current regimen:  . Albuterol 2 puffs q6h prn . Stiolto 2 puffs daily . Interventions: o Reviewed  inhaler technique o Will renew Stiolto PAP . Patient self care activities - Over the next 60 days, patient will: o Attend all scheduled appointments o Provide required portion of PAP documentation  Medication management . Pharmacist Clinical Goal(s): o Over the next 60 days, patient will work with PharmD and providers to achieve optimal medication adherence . Current pharmacy: [facility] . Interventions o Comprehensive medication review performed. o Continue current medication management strategy . Patient self care activities - Over the next 60 days, patient will: o Focus on medication adherence by fill dates o Take medications as prescribed o Report any questions or concerns to PharmD and/or provider(s)  Initial goal documentation        The patient verbalized understanding of instructions provided today and agreed to receive a mailed copy of patient instruction and/or educational materials.  Telephone follow up appointment with pharmacy team member scheduled for: 2 months  Junita Push. Kenton Kingfisher PharmD, BCPS Clinical Pharmacist [phone]  Chronic Obstructive Pulmonary Disease Chronic obstructive pulmonary disease (COPD) is a long-term (chronic) lung problem. When you have COPD, it is hard for air to get in and out of your lungs. Usually the condition gets worse over time, and your lungs will never return to normal. There are things you can do to keep yourself as healthy as possible.  Your doctor may treat your condition with: ? Medicines. ? Oxygen. ? Lung surgery.  Your doctor may also recommend: ? Rehabilitation.  This includes steps to make your body work better. It may involve a team of specialists. ? Quitting smoking, if you smoke. ? Exercise and changes to your diet. ? Comfort measures (palliative care). Follow these instructions at home: Medicines  Take over-the-counter and prescription medicines only as told by your doctor.  Talk to your doctor before taking any  cough or allergy medicines. You may need to avoid medicines that cause your lungs to be dry. Lifestyle  If you smoke, stop. Smoking makes the problem worse. If you need help quitting, ask your doctor.  Avoid being around things that make your breathing worse. This may include smoke, chemicals, and fumes.  Stay active, but remember to rest as well.  Learn and use tips on how to relax.  Make sure you get enough sleep. Most adults need at least 7 hours of sleep every night.  Eat healthy foods. Eat smaller meals more often. Rest before meals. Controlled breathing Learn and use tips on how to control your breathing as told by your doctor. Try:  Breathing in (inhaling) through your nose for 1 second. Then, pucker your lips and breath out (exhale) through your lips for 2 seconds.  Putting one hand on your belly (abdomen). Breathe in slowly through your nose for 1 second. Your hand on your belly should move out. Pucker your lips and breathe out slowly through your lips. Your hand on your belly should move in as you breathe out.  Controlled coughing Learn and use controlled coughing to clear mucus from your lungs. Follow these steps: 1. Lean your head a little forward. 2. Breathe in deeply. 3. Try to hold your breath for 3 seconds. 4. Keep your mouth slightly open while coughing 2 times. 5. Spit any mucus out into a tissue. 6. Rest and do the steps again 1 or 2 times as needed. General instructions  Make sure you get all the shots (vaccines) that your doctor recommends. Ask your doctor about a flu shot and a pneumonia shot.  Use oxygen therapy and pulmonary rehabilitation if told by your doctor. If you need home oxygen therapy, ask your doctor if you should buy a tool to measure your oxygen level (oximeter).  Make a COPD action plan with your doctor. This helps you to know what to do if you feel worse than usual.  Manage any other conditions you have as told by your doctor.  Avoid going  outside when it is very hot, cold, or humid.  Avoid people who have a sickness you can catch (contagious).  Keep all follow-up visits as told by your doctor. This is important. Contact a doctor if:  You cough up more mucus than usual.  There is a change in the color or thickness of the mucus.  It is harder to breathe than usual.  Your breathing is faster than usual.  You have trouble sleeping.  You need to use your medicines more often than usual.  You have trouble doing your normal activities such as getting dressed or walking around the house. Get help right away if:  You have shortness of breath while resting.  You have shortness of breath that stops you from: ? Being able to talk. ? Doing normal activities.  Your chest hurts for longer than 5 minutes.  Your skin color is more blue than usual.  Your pulse oximeter shows that you have low oxygen for longer than 5 minutes.  You have a fever.  You feel too tired to breathe  normally. Summary  Chronic obstructive pulmonary disease (COPD) is a long-term lung problem.  The way your lungs work will never return to normal. Usually the condition gets worse over time. There are things you can do to keep yourself as healthy as possible.  Take over-the-counter and prescription medicines only as told by your doctor.  If you smoke, stop. Smoking makes the problem worse. This information is not intended to replace advice given to you by your health care provider. Make sure you discuss any questions you have with your health care provider. Document Revised: 09/17/2017 Document Reviewed: 11/09/2016 Elsevier Patient Education  2020 [employer].

## 2020-08-23 ENCOUNTER — Ambulatory Visit: Payer: PPO | Admitting: Nurse Practitioner

## 2020-08-23 NOTE — Telephone Encounter (Signed)
Replacement letter and order faxed.

## 2020-08-23 NOTE — Telephone Encounter (Signed)
Attempted to call the pt on 08/23/2020 at 1600 to confirm what he is missing . I left a message for a call back    Pt picked up shipment 07/19/2020 of tresiba and Ozempic . Historically PAP ship 3-4 month supply at a time ,which should have been enough until December. He did NOT have Novolog shipment at the time, and would be happy to supply Novolog until then.   Awaiting on a call back from the pt    [place], MD  [facility] Endocrinology  [facility] [address] [place], [place] [address] Phone: [phone] FAX: [phone]

## 2020-08-26 ENCOUNTER — Telehealth: Payer: Self-pay | Admitting: Internal Medicine

## 2020-08-26 ENCOUNTER — Other Ambulatory Visit: Payer: Self-pay | Admitting: Internal Medicine

## 2020-08-26 ENCOUNTER — Other Ambulatory Visit: Payer: Self-pay | Admitting: Nurse Practitioner

## 2020-08-26 DIAGNOSIS — G894 Chronic pain syndrome: Secondary | ICD-10-CM | POA: Diagnosis not present

## 2020-08-26 NOTE — Telephone Encounter (Signed)
Patient states he is calling to let Dr. Kelton Pillar know he has 2 Novolog Pens, 4 Ozempic Pens and 0 Tresiba Pens. Patient states he will be coming today to pick up all 3 medications listed above.

## 2020-08-26 NOTE — Telephone Encounter (Signed)
Requested Prescriptions  Pending Prescriptions Disp Refills   sucralfate (CARAFATE) 1 g tablet [Pharmacy Med Name: SUCRALFATE 1GM      TAB] 360 tablet 0    Sig: TAKE 1 TABLET BY MOUTH 4 TIMES DAILY WITH MEALS AND AT BEDTIME     Gastroenterology: Antiacids Passed - 08/26/2020  3:49 PM      Passed - Valid encounter within last 12 months    Recent Outpatient Visits          1 month ago Uncontrolled type 2 diabetes mellitus with hyperglycemia ([place])   [facility] Kathrine Haddock, NP   3 months ago Routine general medical examination at a health care facility   [facility], [place] T, NP   5 months ago Uncontrolled type 2 diabetes mellitus with hyperglycemia ([place])   [place], [place] T, NP   7 months ago Hypertension associated with diabetes ([place])   [place], [place] T, NP   8 months ago Hypotension due to drugs   [facility] [name], NP      Future Appointments            In 2 weeks End, Harrell Gave, MD [facility], [place]

## 2020-08-26 NOTE — Telephone Encounter (Signed)
Noted  

## 2020-08-28 ENCOUNTER — Other Ambulatory Visit: Payer: Self-pay | Admitting: Internal Medicine

## 2020-09-02 ENCOUNTER — Ambulatory Visit (INDEPENDENT_AMBULATORY_CARE_PROVIDER_SITE_OTHER): Payer: PPO | Admitting: Nurse Practitioner

## 2020-09-02 ENCOUNTER — Encounter: Payer: Self-pay | Admitting: Nurse Practitioner

## 2020-09-02 ENCOUNTER — Other Ambulatory Visit: Payer: Self-pay

## 2020-09-02 VITALS — BP 114/73 | HR 81 | Temp 97.8°F | Wt 248.2 lb

## 2020-09-02 DIAGNOSIS — E1169 Type 2 diabetes mellitus with other specified complication: Secondary | ICD-10-CM | POA: Diagnosis not present

## 2020-09-02 DIAGNOSIS — E1159 Type 2 diabetes mellitus with other circulatory complications: Secondary | ICD-10-CM | POA: Diagnosis not present

## 2020-09-02 DIAGNOSIS — Z125 Encounter for screening for malignant neoplasm of prostate: Secondary | ICD-10-CM

## 2020-09-02 DIAGNOSIS — I152 Hypertension secondary to endocrine disorders: Secondary | ICD-10-CM | POA: Diagnosis not present

## 2020-09-02 DIAGNOSIS — E1165 Type 2 diabetes mellitus with hyperglycemia: Secondary | ICD-10-CM | POA: Diagnosis not present

## 2020-09-02 DIAGNOSIS — I25118 Atherosclerotic heart disease of native coronary artery with other forms of angina pectoris: Secondary | ICD-10-CM

## 2020-09-02 DIAGNOSIS — Z6837 Body mass index (BMI) 37.0-37.9, adult: Secondary | ICD-10-CM

## 2020-09-02 DIAGNOSIS — F321 Major depressive disorder, single episode, moderate: Secondary | ICD-10-CM | POA: Diagnosis not present

## 2020-09-02 DIAGNOSIS — J432 Centrilobular emphysema: Secondary | ICD-10-CM | POA: Diagnosis not present

## 2020-09-02 DIAGNOSIS — M25561 Pain in right knee: Secondary | ICD-10-CM

## 2020-09-02 DIAGNOSIS — I700 Atherosclerosis of aorta: Secondary | ICD-10-CM

## 2020-09-02 DIAGNOSIS — G4733 Obstructive sleep apnea (adult) (pediatric): Secondary | ICD-10-CM

## 2020-09-02 DIAGNOSIS — E785 Hyperlipidemia, unspecified: Secondary | ICD-10-CM | POA: Diagnosis not present

## 2020-09-02 LAB — BAYER DCA HB A1C WAIVED: HB A1C (BAYER DCA - WAIVED): 8.9 % — ABNORMAL HIGH (ref <7.0)

## 2020-09-02 NOTE — Assessment & Plan Note (Signed)
Chronic, stable with BP at goal.  Continue current medication regimen and adjust as needed + continue collaboration with cardiology team.  Recommend he continue to monitor BP closely at home and document + bring levels to visit.  DASH diet at home.  BMP and TSH today.  Return as scheduled in 3 months.

## 2020-09-02 NOTE — Assessment & Plan Note (Signed)
Chronic, ongoing secondary to chronic diseases and pain.  Continue Duloxetine, which benefits mood and pain, plus Trazodone for sleep.  Refuses psychiatry or therapy referral.  Will continue current regimen and adjust as needed.  If SI presents he is to immediately go to ER, which he agrees with.  Return to office in 3 months, sooner if worsening mood.

## 2020-09-02 NOTE — Progress Notes (Signed)
BP 114/73   Pulse 81   Temp 97.8 F (36.6 C)   Wt 248 lb 3.2 oz (112.6 kg)   SpO2 97%   BMI 37.74 kg/m    Subjective:    Patient ID: Edward Schneider., male    DOB: [birth year], 64 y.o.   MRN: [id number]  HPI: Edward Schneider. is a 64 y.o. male  Chief Complaint  Patient presents with  . Diabetes  . Knee Pain   DIABETES Sees endocrinology and last saw10/4/21with A1C10.3% in August on last check, Dr. Kelton Pillar.To continueinsulin Tresiba 28units and Humalog18 units with breakfast and lunch andthen 16 withsupper. Taking Ozempic 0.5 MG weekly.Is using Libre, did not bring it with him today.  Reports his sugars have been 107 to 240 range. Hypoglycemic episodes:no Polydipsia/polyuria:no Visual disturbance:no Chest pain:no Paresthesias:no Glucose Monitoring:yes Accucheck frequency: QID Fasting glucose: Post prandial: Evening: Before meals: Taking Insulin?:yes Long acting insulin: Tresiba28units Short acting insulin: Humalog18 units with breakfast and lunch andthen 16 withsupper Blood Pressure Monitoring:daily Retinal Examination:Up to Date Foot Exam:Up to Date Pneumovax:Up to Date Influenza:Up to Date Aspirin:yes  HYPERTENSION / HYPERLIPIDEMIA Followed by cardiology and last saw7/20/21. Last echo February 2021 = EF 60-65% with normal left ventricular diastolic parameters.Continues Atorvastatin 80 MG.  Takes Benazepril for HTN.   Satisfied with current treatment?yes Duration of hypertension:chronic BP monitoring frequency:daily BP range:120/80 on average BP medication side effects:no Duration of hyperlipidemia:chronic Cholesterol medication side effects:no Cholesterol supplements: none Medication compliance:good compliance Aspirin:yes Recent stressors:no Recurrent headaches:no Visual changes:no Palpitations:no Dyspnea: at  baseline Chest pain: none recent Lower extremity edema:no Dizzy/lightheaded:no The ASCVD Risk score [name], et al., 2013) failed to calculate for the following reasons:   The patient has a prior MI or stroke diagnosis  COPD Continues Stiolto and Albuterol -- he is out of these, gets them via assistance. Does use CPAP and uses 100% of the time.Smoked for 48 years, was 6-7 when started. Smoked 3 cartons to a carton and a half a week. Quit in 2012. Goes for annual lung screening, last went July 2021. Last saw pulmonary on 07/01/20 -- goes back next month.   COPD status:stable Satisfied with current treatment?:yes Oxygen use:no Dyspnea frequency:at baseline Cough frequency:minimal Rescue inhaler frequency:using three times a day Limitation of activity:no Productive cough:none Last Spirometry:unknown Pneumovax:Up to Date Influenza:Up to Date  CHRONICPAIN Pain medicine provider, Dr. Primus Bravo and last saw on the 26th. Takes Oxycodone as needed.  Currently being followed by ortho for right knee pain and is to have right knee replacement -- to have scheduled on Wednesday. Duration:chronic Mechanism of injury:unknown Location:low back Onset:gradual Severity:10/10 Quality:sharp and aching Frequency:intermittent Radiation:R leg below the knee and L leg below the knee Aggravating factors:lifting, movement, walking and bending Alleviating factors: nothing Status:worse Treatments attempted:Oxycodone Relief with NSAIDs?:No NSAIDs Taken Nighttime pain:at times Paresthesias / decreased sensation:yes Bowel / bladder incontinence:no Fevers:no Dysuria / urinary frequency:no  DEPRESSION Continues on Duloxetine 90 MG daily and Trazodone.  Mood status:stable Satisfied with current treatment?:yes Symptom severity:moderate Duration of current treatment :chronic Side effects:no Medication compliance:good  compliance Psychotherapy/counseling:none Depressed mood:yes Anxious mood:no Anhedonia:no Significant weight loss or gain:no Insomnia:yeshard to fall asleep Fatigue:no Feelings of worthlessness or guilt:yes Impaired concentration/indecisiveness:yes Suicidal ideations:as above Hopelessness:no Crying spells:no Depression screen [facility] 2/9 09/02/2020 05/23/2020 05/21/2020 03/06/2020 03/06/2020  Decreased Interest 0 1 2 0 0  Down, Depressed, Hopeless 1 3 1 3  0  PHQ - 2 Score 1 4 3 3  0  Altered sleeping 3 1 3  3 -  Tired, decreased energy 3 3 3  0 -  Change in appetite 0 1 3 1  -  Feeling bad or failure about yourself  0 0 0 1 -  Trouble concentrating 0 0 0 0 -  Moving slowly or fidgety/restless 0 0 1 0 -  Suicidal thoughts 0 0 0 1 -  PHQ-9 Score 7 9 13 9  -  Difficult doing work/chores Not difficult at all Somewhat difficult Very difficult Somewhat difficult -  Some recent data might be hidden     Relevant past medical, surgical, family and social history reviewed and updated as indicated. Interim medical history since our last visit reviewed. Allergies and medications reviewed and updated.  Review of Systems  Constitutional: Negative.   Respiratory: Negative.   Cardiovascular: Negative.   Gastrointestinal: Negative.   Musculoskeletal: Positive for arthralgias.  Neurological: Negative.     Per HPI unless specifically indicated above     Objective:    BP 114/73   Pulse 81   Temp 97.8 F (36.6 C)   Wt 248 lb 3.2 oz (112.6 kg)   SpO2 97%   BMI 37.74 kg/m   Wt Readings from Last 3 Encounters:  09/02/20 248 lb 3.2 oz (112.6 kg)  08/07/20 250 lb (113.4 kg)  07/22/20 246 lb 6.4 oz (111.8 kg)    Physical Exam Vitals and nursing note reviewed.  Constitutional:      General: He is awake. He is not in acute distress.    Appearance: He is well-developed and well-groomed. He is obese. He is not ill-appearing.  HENT:     Head: Normocephalic and atraumatic.     Right  Ear: Hearing normal. No drainage.     Left Ear: Hearing normal. No drainage.  Eyes:     General: Lids are normal.        Right eye: No discharge.        Left eye: No discharge.     Conjunctiva/sclera: Conjunctivae normal.     Pupils: Pupils are equal, round, and reactive to light.  Neck:     Thyroid: No thyromegaly.     Vascular: No carotid bruit.     Trachea: Trachea normal.  Cardiovascular:     Rate and Rhythm: Normal rate and regular rhythm.     Heart sounds: Normal heart sounds, S1 normal and S2 normal. No murmur heard.  No gallop.   Pulmonary:     Effort: Pulmonary effort is normal. No accessory muscle usage or respiratory distress.     Breath sounds: Normal breath sounds.  Abdominal:     General: Bowel sounds are normal.     Palpations: Abdomen is soft.  Musculoskeletal:        General: Normal range of motion.     Cervical back: Normal range of motion and neck supple.     Right lower leg: No edema.     Left lower leg: No edema.  Skin:    General: Skin is warm and dry.     Capillary Refill: Capillary refill takes less than 2 seconds.     Findings: No rash.  Neurological:     Mental Status: He is alert and oriented to person, place, and time.     Deep Tendon Reflexes: Reflexes are normal and symmetric.  Psychiatric:        Attention and Perception: Attention normal.        Mood and Affect: Mood normal.        Behavior: Behavior normal.  Behavior is cooperative.        Thought Content: Thought content normal.     Results for orders placed or performed during the hospital encounter of 06/18/20  Glucose, capillary  Result Value Ref Range   Glucose-Capillary 252 (H) 70 - 99 mg/dL      Assessment & Plan:   Problem List Items Addressed This Visit      Cardiovascular and Mediastinum   Hypertension associated with diabetes ([place])    Chronic, stable with BP at goal.  Continue current medication regimen and adjust as needed + continue collaboration with cardiology team.   Recommend he continue to monitor BP closely at home and document + bring levels to visit.  DASH diet at home.  BMP and TSH today.  Return as scheduled in 3 months.      Relevant Orders   Bayer DCA Hb A1c Waived   Basic metabolic panel   TSH   Atherosclerosis of abdominal aorta (HCC)    Noted on CT scan in 2016, continue daily statin and ASA for prevention.  Recommend modest weight loss.      Coronary artery disease of native artery of native heart with stable angina pectoris ([place])    Followed by cardiology.  Continue current medication regimen and collaboration with cardiology.          Respiratory   Emphysema lung (HCC)    Chronic, ongoing.  FEV1 52% and FEV1/FVC 96% on recent spiometry.  Continue current inhaler regimen, Stiolto through assistance program received and Albuterol - reached out to Birdena Crandall PharmD on this.  Continue collaboration with pulmonary.  Recommend focus on modest weight loss.  Return in 3 months.      Sleep apnea    Chronic, continue 100% use of CPAP at home.        Endocrine   Hyperlipidemia associated with type 2 diabetes mellitus (HCC)    Chronic, ongoing.  Continue current medication regimen and adjust as needed.  Lipid panel today.       Relevant Orders   Bayer DCA Hb A1c Waived   Lipid Panel w/o Chol/HDL Ratio   Uncontrolled type 2 diabetes mellitus with hyperglycemia (HCC) - Primary    Chronic, ongoing will continue to collaborate with endo.  A1C today 8.9%.  Continue collaboration with endocrinology and current medication regimen.  Continue collaboration with CCM team.  Recommend continue to monitor BS QID with [name] and focus heavily on diet and modest weight loss.  Return in 3 months.      Relevant Orders   Bayer DCA Hb A1c Waived     Other   Knee pain, right    Continue to collaborate with ortho, to have knee replacement upcoming.      Morbid obesity ([place])    With T2DM and HTN.  Recommended eating smaller high protein, low fat  meals more frequently and exercising 30 mins a day 5 times a week with a goal of 10-15lb weight loss in the next 3 months. Patient voiced their understanding and motivation to adhere to these recommendations.       Depression, major, single episode, moderate (HCC)    Chronic, ongoing secondary to chronic diseases and pain.  Continue Duloxetine, which benefits mood and pain, plus Trazodone for sleep.  Refuses psychiatry or therapy referral.  Will continue current regimen and adjust as needed.  If SI presents he is to immediately go to ER, which he agrees with.  Return to office in 3 months, sooner  if worsening mood.      BMI 37.0-37.9, adult    Refer to morbid obesity plan of care.       Other Visit Diagnoses    Prostate cancer screening       PSA on labs today for screening.   Relevant Orders   PSA       Follow up plan: Return in about 3 months (around 12/03/2020) for T2DM, MOOD, HTN/HLD, CHRONIC PAIN, COPD.

## 2020-09-02 NOTE — Assessment & Plan Note (Signed)
Followed by cardiology.  Continue current medication regimen and collaboration with cardiology.

## 2020-09-02 NOTE — Assessment & Plan Note (Signed)
Chronic, continue 100% use of CPAP at home. 

## 2020-09-02 NOTE — Assessment & Plan Note (Signed)
Continue to collaborate with ortho, to have knee replacement upcoming.

## 2020-09-02 NOTE — Assessment & Plan Note (Signed)
With T2DM and HTN.  Recommended eating smaller high protein, low fat meals more frequently and exercising 30 mins a day 5 times a week with a goal of 10-15lb weight loss in the next 3 months. Patient voiced their understanding and motivation to adhere to these recommendations. ° °

## 2020-09-02 NOTE — Assessment & Plan Note (Signed)
Chronic, ongoing.  Continue current medication regimen and adjust as needed. Lipid panel today. 

## 2020-09-02 NOTE — Assessment & Plan Note (Signed)
Refer to morbid obesity plan of care. 

## 2020-09-02 NOTE — Assessment & Plan Note (Signed)
Chronic, ongoing will continue to collaborate with endo.  A1C today 8.9%.  Continue collaboration with endocrinology and current medication regimen.  Continue collaboration with CCM team.  Recommend continue to monitor BS QID with [name] and focus heavily on diet and modest weight loss.  Return in 3 months.

## 2020-09-02 NOTE — Assessment & Plan Note (Signed)
Chronic, ongoing.  FEV1 52% and FEV1/FVC 96% on recent spiometry.  Continue current inhaler regimen, Stiolto through assistance program received and Albuterol - reached out to Birdena Crandall PharmD on this.  Continue collaboration with pulmonary.  Recommend focus on modest weight loss.  Return in 3 months.

## 2020-09-02 NOTE — Patient Instructions (Signed)

## 2020-09-02 NOTE — Assessment & Plan Note (Signed)
Noted on CT scan in 2016, continue daily statin and ASA for prevention.  Recommend modest weight loss.

## 2020-09-03 LAB — PSA: Prostate Specific Ag, Serum: 0.4 ng/mL (ref 0.0–4.0)

## 2020-09-03 LAB — TSH: TSH: 1.02 u[IU]/mL (ref 0.450–4.500)

## 2020-09-03 LAB — BASIC METABOLIC PANEL
BUN/Creatinine Ratio: 12 (ref 10–24)
BUN: 13 mg/dL (ref 8–27)
CO2: 19 mmol/L — ABNORMAL LOW (ref 20–29)
Calcium: 9.7 mg/dL (ref 8.6–10.2)
Chloride: 104 mmol/L (ref 96–106)
Creatinine, Ser: 1.06 mg/dL (ref 0.76–1.27)
GFR calc Af Amer: 86 mL/min/{1.73_m2} (ref >59)
GFR calc non Af Amer: 74 mL/min/{1.73_m2} (ref >59)
Glucose: 178 mg/dL — ABNORMAL HIGH (ref 65–99)
Potassium: 4.2 mmol/L (ref 3.5–5.2)
Sodium: 137 mmol/L (ref 134–144)

## 2020-09-03 LAB — LIPID PANEL W/O CHOL/HDL RATIO
Cholesterol, Total: 146 mg/dL (ref 100–199)
HDL: 44 mg/dL (ref >39)
LDL Chol Calc (NIH): 79 mg/dL (ref 0–99)
Triglycerides: 127 mg/dL (ref 0–149)
VLDL Cholesterol Cal: 23 mg/dL (ref 5–40)

## 2020-09-03 NOTE — Progress Notes (Signed)
Good morning, please let Edward Schneider know his labs have returned and overall they are staying at baseline.  Kidney function remains stable and cholesterol levels at goal.  Thyroid and prostate labs are normal.  Continue all current medications.  Have a great day!! Keep being awesome!!  Thank you for allowing me to participate in your care. Kindest regards, Antoneo Ghrist

## 2020-09-04 DIAGNOSIS — M1711 Unilateral primary osteoarthritis, right knee: Secondary | ICD-10-CM | POA: Diagnosis not present

## 2020-09-04 DIAGNOSIS — M19011 Primary osteoarthritis, right shoulder: Secondary | ICD-10-CM | POA: Diagnosis not present

## 2020-09-04 DIAGNOSIS — M179 Osteoarthritis of knee, unspecified: Secondary | ICD-10-CM | POA: Diagnosis not present

## 2020-09-04 DIAGNOSIS — E11610 Type 2 diabetes mellitus with diabetic neuropathic arthropathy: Secondary | ICD-10-CM | POA: Diagnosis not present

## 2020-09-04 DIAGNOSIS — M1611 Unilateral primary osteoarthritis, right hip: Secondary | ICD-10-CM | POA: Diagnosis not present

## 2020-09-04 DIAGNOSIS — G894 Chronic pain syndrome: Secondary | ICD-10-CM | POA: Diagnosis not present

## 2020-09-04 DIAGNOSIS — M5136 Other intervertebral disc degeneration, lumbar region: Secondary | ICD-10-CM | POA: Diagnosis not present

## 2020-09-06 ENCOUNTER — Other Ambulatory Visit: Payer: Self-pay | Admitting: Orthopedic Surgery

## 2020-09-11 ENCOUNTER — Ambulatory Visit: Payer: PPO | Admitting: Internal Medicine

## 2020-09-20 ENCOUNTER — Other Ambulatory Visit: Payer: Self-pay

## 2020-09-20 ENCOUNTER — Ambulatory Visit: Payer: PPO | Admitting: Internal Medicine

## 2020-09-20 ENCOUNTER — Encounter: Payer: Self-pay | Admitting: Internal Medicine

## 2020-09-20 VITALS — BP 150/70 | HR 87 | Ht 68.0 in | Wt 257.0 lb

## 2020-09-20 DIAGNOSIS — Z01810 Encounter for preprocedural cardiovascular examination: Secondary | ICD-10-CM | POA: Diagnosis not present

## 2020-09-20 DIAGNOSIS — I10 Essential (primary) hypertension: Secondary | ICD-10-CM | POA: Diagnosis not present

## 2020-09-20 DIAGNOSIS — Z79899 Other long term (current) drug therapy: Secondary | ICD-10-CM | POA: Diagnosis not present

## 2020-09-20 DIAGNOSIS — E785 Hyperlipidemia, unspecified: Secondary | ICD-10-CM | POA: Diagnosis not present

## 2020-09-20 DIAGNOSIS — I25118 Atherosclerotic heart disease of native coronary artery with other forms of angina pectoris: Secondary | ICD-10-CM

## 2020-09-20 MED ORDER — EZETIMIBE 10 MG PO TABS: 10.0000 mg | ORAL_TABLET | Freq: Every day | ORAL | 5 refills | Status: DC
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
## 2020-09-20 NOTE — Patient Instructions (Addendum)
Medication Instructions:  Your physician has recommended you make the following change in your medication:  1- START Ezetimibe (Zetia) 10 mg by mouth daily. 2- STOP Plavix.  *If you need a refill on your cardiac medications before your next appointment, please call your pharmacy*  Lab Work: Your physician recommends that you return for lab work in: 3 MONTHS ~ APPROX. MARCH 3RD, 2022. - CHECK LIPID, ALT. - You will need to be fasting. Please do not have anything to eat or drink after midnight the morning you have the lab work. You may only have water or black coffee with no cream or sugar. - Please go to the [facility] You will check in at the front desk to the right as you walk into the atrium. Valet Parking is offered if needed. - No appointment needed. You may go any day between 7 am and 6 pm.  If you have labs (blood work) drawn today and your tests are completely normal, you will receive your results only by: Marland Kitchen MyChart Message (if you have MyChart) OR . A paper copy in the mail If you have any lab test that is abnormal or we need to change your treatment, we will call you to review the results.  Testing/Procedures: none  Follow-Up: At [facility], you and your health needs are our priority.  As part of our continuing mission to provide you with exceptional heart care, we have created designated Provider Care Teams.  These Care Teams include your primary Cardiologist (physician) and Advanced Practice Providers (APPs -  Physician Assistants and Nurse Practitioners) who all work together to provide you with the care you need, when you need it.  We recommend signing up for the patient portal called "MyChart".  Sign up information is provided on this After Visit Summary.  MyChart is used to connect with patients for Virtual Visits (Telemedicine).  Patients are able to view lab/test results, encounter notes, upcoming appointments, etc.  Non-urgent messages can be sent to your  provider as well.   To learn more about what you can do with MyChart, go to [email or web address].    Your next appointment:   6 month(s)  The format for your next appointment:   In Person  Provider:   You may see Nelva Bush, MD or one of the following Advanced Practice Providers on your designated Care Team:    Murray Hodgkins, NP  Christell Faith, PA-C  Marrianne Mood, PA-C  Cadence Aurora, [place]  Laurann Montana, NP

## 2020-09-24 ENCOUNTER — Other Ambulatory Visit
Admission: RE | Admit: 2020-09-24 | Discharge: 2020-09-24 | Disposition: A | Discharge status: Home / Self Care | Payer: PPO | Source: Ambulatory Visit | Attending: Orthopedic Surgery | Admitting: Orthopedic Surgery

## 2020-09-24 NOTE — Patient Instructions (Addendum)
Your procedure is scheduled on:10/03/20- Thursday Report to the Registration Desk on the 1st floor of the [facility]. To find out your arrival time, please call [phone] between 1PM - 3PM on: 10/02/20- Wednesday  REMEMBER: Instructions that are not followed completely may result in serious medical risk, up to and including death; or upon the discretion of your surgeon and anesthesiologist your surgery may need to be rescheduled.  Do not eat food after midnight the night before surgery.  No gum chewing, lozengers or hard candies.  You may however, drink CLEAR liquids up to 2 hours before you are scheduled to arrive for your surgery. Do not drink anything within 2 hours of your scheduled arrival time. Type 1 and Type 2 diabetics should only drink water.  In addition, your doctor has ordered for you to drink the provided  Gatorade G2 Drinking this carbohydrate drink up to two hours before surgery helps to reduce insulin resistance and improve patient outcomes. Please complete drinking 2 hours prior to scheduled arrival time.  TAKE THESE MEDICATIONS THE MORNING OF SURGERY WITH A SIP OF WATER: - pantoprazole (PROTONIX) 40 MG tablet, take one the night before and one on the morning of surgery - helps to prevent nausea after surgery.) -pregabalin (LYRICA) 50 MG capsule  - ezetimibe (ZETIA) 10 MG tablet - cephALEXin (KEFLEX) 500 MG capsule - cetirizine (ZYRTEC) 10 MG tablet  Use inhaler  albuterol (VENTOLIN HFA) 108 (90 Base) MCG/ACT inhaler on the day of surgery and bring to the hospital.  Take 1/2 of usual insulin dose of degludec (TRESIBA FLEXTOUCH) 100 UNIT/ML FlexTouch Pen the night before surgery and none on the morning of surgery.  Follow recommendations from Cardiologist, Pulmonologist or PCP regarding stopping Aspirin, Coumadin, Plavix, Eliquis, Pradaxa, or Pletal.  One week prior to surgery: Stop on 09/25/20 Stop Anti-inflammatories (NSAIDS) such as Advil, Aleve, Ibuprofen,  Motrin, Naproxen, Naprosyn and Aspirin based products such as Excedrin, Goodys Powder, BC Powder.  Stop ANY OVER THE COUNTER supplements until after surgery on 09/25/20.Ginger, Zingiber officinalis, (GINGER PO), Omega-3 1000 MG CAPS,Vitamin E 400 units TABS, Ginseng 100 MG CAPS, CINNAMON PO,Ascorbic Acid (VITAMIN C) 1000 MG tablet  (However, you may continue taking Vitamin D, Vitamin B, and multivitamin up until the day before surgery.)  No Alcohol for 24 hours before or after surgery.  No Smoking including e-cigarettes for 24 hours prior to surgery.  No chewable tobacco products for at least 6 hours prior to surgery.  No nicotine patches on the day of surgery.  Do not use any "recreational" drugs for at least a week prior to your surgery.  Please be advised that the combination of cocaine and anesthesia may have negative outcomes, up to and including death. If you test positive for cocaine, your surgery will be cancelled.  On the morning of surgery brush your teeth with toothpaste and water, you may rinse your mouth with mouthwash if you wish. Do not swallow any toothpaste or mouthwash.  Do not wear jewelry, make-up, hairpins, clips or nail polish.  Do not wear lotions, powders, or perfumes.   Do not shave body from the neck down 48 hours prior to surgery just in case you cut yourself which could leave a site for infection.  Also, freshly shaved skin may become irritated if using the CHG soap.  Contact lenses, hearing aids and dentures may not be worn into surgery.  Do not bring valuables to the hospital. [facility] is not responsible for any missing/lost belongings  or valuables.   Use CHG Soap or wipes as directed on instruction sheet.  Bring your C-PAP to the hospital with you in case you may have to spend the night.   Notify your doctor if there is any change in your medical condition (cold, fever, infection).  Wear comfortable clothing (specific to your surgery type) to the  hospital.  Plan for stool softeners for home use; pain medications have a tendency to cause constipation. You can also help prevent constipation by eating foods high in fiber such as fruits and vegetables and drinking plenty of fluids as your diet allows.  After surgery, you can help prevent lung complications by doing breathing exercises.  Take deep breaths and cough every 1-2 hours. Your doctor may order a device called an Incentive Spirometer to help you take deep breaths. When coughing or sneezing, hold a pillow firmly against your incision with both hands. This is called "splinting." Doing this helps protect your incision. It also decreases belly discomfort.  If you are being admitted to the hospital overnight, leave your suitcase in the car. After surgery it may be brought to your room.  If you are being discharged the day of surgery, you will not be allowed to drive home. You will need a responsible adult (18 years or older) to drive you home and stay with you that night.   If you are taking public transportation, you will need to have a responsible adult (18 years or older) with you. Please confirm with your physician that it is acceptable to use public transportation.   Please call the [place] Dept. at [phone] if you have any questions about these instructions.  Visitation Policy:  Patients undergoing a surgery or procedure may have one family member or support person with them as long as that person is not COVID-19 positive or experiencing its symptoms.  That person may remain in the waiting area during the procedure.  Inpatient Visitation Update:   In an effort to ensure the safety of our team members and our patients, we are implementing a change to our visitation policy:  Effective Monday, Aug. 9, at 7 a.m., inpatients will be allowed one support person.  o The support person may change daily.  o The support person must pass our screening, gel in and  out, and wear a mask at all times, including in the patient's room.  o Patients must also wear a mask when staff or their support person are in the room.  o Masking is required regardless of vaccination status.  Systemwide, no visitors 17 or younger.

## 2020-09-25 DIAGNOSIS — G894 Chronic pain syndrome: Secondary | ICD-10-CM | POA: Diagnosis not present

## 2020-09-25 NOTE — Addendum Note (Signed)
Addended by: Therisa Doyne on: 09/25/2020 11:13 AM   Modules accepted: Orders

## 2020-09-26 ENCOUNTER — Other Ambulatory Visit: Payer: Self-pay | Admitting: Internal Medicine

## 2020-09-30 DIAGNOSIS — M179 Osteoarthritis of knee, unspecified: Secondary | ICD-10-CM | POA: Diagnosis not present

## 2020-09-30 DIAGNOSIS — M5136 Other intervertebral disc degeneration, lumbar region: Secondary | ICD-10-CM | POA: Diagnosis not present

## 2020-09-30 DIAGNOSIS — M19012 Primary osteoarthritis, left shoulder: Secondary | ICD-10-CM | POA: Diagnosis not present

## 2020-09-30 DIAGNOSIS — M19011 Primary osteoarthritis, right shoulder: Secondary | ICD-10-CM | POA: Diagnosis not present

## 2020-10-01 ENCOUNTER — Other Ambulatory Visit: Admission: RE | Admit: 2020-10-01 | Payer: PPO | Source: Ambulatory Visit

## 2020-10-01 DIAGNOSIS — L281 Prurigo nodularis: Secondary | ICD-10-CM | POA: Diagnosis not present

## 2020-10-02 ENCOUNTER — Telehealth: Payer: Self-pay | Admitting: Pharmacist

## 2020-10-02 ENCOUNTER — Telehealth: Payer: Self-pay | Admitting: Internal Medicine

## 2020-10-02 NOTE — Telephone Encounter (Signed)
Please advise. What should we do about this patient? I can't remember what you wanted to do.

## 2020-10-02 NOTE — Chronic Care Management (AMB) (Addendum)
10/02/20- 10-02-20. PharmD requested for CPA to follow up with the patient's endocrinologist office due to the patient calling the PCP office reporting he was out of his diabetic medications.  CPA called and spoke to staff member Sherlynn Carbon at [facility] [place] to report the patient's urgent need for his insulin medication. CPA also informed her that it is too late in the year to request refills through [facility] so he would need to be provided with samples if available or request an emergency fill from [employer]. Sherlynn Carbon verbalized understanding and stated, "I did speak with the patient this morning and doesn't think the patient understands the turnaround time is not going to be quick but I will message Dr. Quin Hoop nurse to see what possible options can be provided for the patient". CPA verbalized understanding and informed her I will notify the PharmD at [facility].   Raynelle Highland, [place] Assistant [phone]  I have reviewed the care management and care coordination activities outlined in this encounter and I am certifying that I agree with the content of this note.   10/03/20   I personally called Mr. Zeek and left message including my cell number. Dr. Quin Hoop office is aware and handling his insulin needs. Please see telephone from that office for details. Junita Push. Kenton Kingfisher PharmD, [facility] [phone]

## 2020-10-02 NOTE — Telephone Encounter (Signed)
Patient calls, LVM that he is going to be "out of insulin on Saturday".   Routing to current embedded team for assistance.   Of note, too late in the year to request refill on Novo patient assistance. Consider Novo Immediate Supply if this hasn't already been used this year.

## 2020-10-02 NOTE — Telephone Encounter (Signed)
Patient called stating he goes through patient assistance and has his medications delivered to our office, but that we normally are the ones who call the patient assistance to have them ordered?  Patient is out of [place] and Novolog and requests we call those in for him as soon as possible.

## 2020-10-02 NOTE — Telephone Encounter (Signed)
[facility] called regarding patient's medication - asking if we can please call [facility] for the patient to have an "emergency" delivery so patient can get his medication as soon as possible. Please advise.

## 2020-10-02 NOTE — Telephone Encounter (Signed)
Thank you Barnetta Chapel -- Almyra Free can you assist with this.  He is followed by endo as well.

## 2020-10-03 ENCOUNTER — Encounter: Admission: RE | Payer: Self-pay | Source: Home / Self Care

## 2020-10-03 ENCOUNTER — Inpatient Hospital Stay: Admission: RE | Admit: 2020-10-03 | Payer: PPO | Source: Home / Self Care | Admitting: Orthopedic Surgery

## 2020-10-03 ENCOUNTER — Telehealth: Payer: Self-pay

## 2020-10-03 SURGERY — ARTHROPLASTY, HIP, TOTAL, ANTERIOR APPROACH
Anesthesia: Choice | Site: Hip | Laterality: Right

## 2020-10-03 NOTE — Telephone Encounter (Signed)
Thanks Catie.  I left him my number again so hopefully he won't contact you next time.  Endo has been contacted and they are handling his acute needs and patient assistance. Left message for patient to return my call.

## 2020-10-03 NOTE — Telephone Encounter (Signed)
Copied from [place] [id number]. Topic: General - Other >> Oct 03, 2020  2:03 PM Hinda Lenis D wrote: PT need to speak with a nurse in regards his insuline meds / he doesn't have money to pick them up / please advise

## 2020-10-03 NOTE — Telephone Encounter (Signed)
I have check the samples we only have 2 box of Tresiba and we have a Humalog not Novolog.

## 2020-10-03 NOTE — Telephone Encounter (Addendum)
Per DPR, left detail message for patient for patient to return my call

## 2020-10-03 NOTE — Telephone Encounter (Signed)
Routing to pharmacist. Almyra Free, it looks like you tried to contact the patient yesterday.

## 2020-10-03 NOTE — Telephone Encounter (Signed)
We have had issues for the past 6 months with Novodordisk.    Here are my solutions Start a NEW application with [employer] ( better experience then novo nordisk) Supply with samples of long and short acting  If we do not have enough samples, let me know, he will need to start getting the [facility] brand until [employer] ships things to him   That being said, he is to be responsible for calling for refills to the company, we update the prescription when I make adjustments, but we are not home with him and we wouldn't know when he is running low, he should call the company for refills 2-4 weeks before he runs completely out

## 2020-10-04 ENCOUNTER — Telehealth: Payer: Self-pay | Admitting: Pharmacist

## 2020-10-04 NOTE — Telephone Encounter (Signed)
Patient came in this morning and pick up sample (1-box Tyler Aas and 1-box Humalog) and [employer].

## 2020-10-04 NOTE — Chronic Care Management (AMB) (Signed)
10/04/2020- CPA attempted to outreach the patient to inform him of samples available at Dr. Quin Hoop office to be picked up and signature needed to complete his patient assistance application. No answer; left a HIPAA compliant voicemail for a call back.   Raynelle Highland, [place] Assistant [phone]

## 2020-10-04 NOTE — Telephone Encounter (Signed)
Tresiba and Humalog are good. Please give me [employer] forms ( 2 medication forms) so I can fill them before I leave     Abby Nena Jordan, MD  [facility]  [facility] [address] [place], [place] [address] Phone: [phone] FAX: [phone]

## 2020-10-17 ENCOUNTER — Ambulatory Visit: Payer: PPO | Admitting: Podiatry

## 2020-10-17 ENCOUNTER — Other Ambulatory Visit: Payer: Self-pay

## 2020-10-17 ENCOUNTER — Encounter: Payer: Self-pay | Admitting: Podiatry

## 2020-10-17 DIAGNOSIS — B351 Tinea unguium: Secondary | ICD-10-CM | POA: Diagnosis not present

## 2020-10-17 DIAGNOSIS — M79674 Pain in right toe(s): Secondary | ICD-10-CM | POA: Diagnosis not present

## 2020-10-17 DIAGNOSIS — D689 Coagulation defect, unspecified: Secondary | ICD-10-CM | POA: Diagnosis not present

## 2020-10-17 DIAGNOSIS — M79675 Pain in left toe(s): Secondary | ICD-10-CM | POA: Diagnosis not present

## 2020-10-17 DIAGNOSIS — E1142 Type 2 diabetes mellitus with diabetic polyneuropathy: Secondary | ICD-10-CM | POA: Diagnosis not present

## 2020-10-17 NOTE — Progress Notes (Signed)
This patient returns to my office for at risk foot care.  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  This patient is unable to cut nails himself since the patient cannot reach his nails.These nails are painful walking and wearing shoes.  This patient presents for at risk foot care today.  General Appearance  Alert, conversant and in no acute stress.  Vascular  Dorsalis pedis and posterior tibial  pulses are palpable  bilaterally.  Capillary return is within normal limits  bilaterally. Temperature is within normal limits  bilaterally.  Neurologic  Senn-Weinstein monofilament wire test within normal limits  bilaterally. Muscle power within normal limits bilaterally.  Nails Thick disfigured discolored nails with subungual debris  from hallux to fifth toes bilaterally. No evidence of bacterial infection or drainage bilaterally.  Orthopedic  No limitations of motion  feet .  No crepitus or effusions noted.  No bony pathology or digital deformities noted.  Skin  normotropic skin with no porokeratosis noted bilaterally.  No signs of infections or ulcers noted.     Onychomycosis  Pain in right toes  Pain in left toes  Consent was obtained for treatment procedures.   Mechanical debridement of nails 1-5  bilaterally performed with a nail nipper.  Filed with dremel without incident.    Return office visit   10 weeks                   Told patient to return for periodic foot care and evaluation due to potential at risk complications.   Saige Canton DPM  

## 2020-10-21 ENCOUNTER — Encounter: Payer: Self-pay | Admitting: Internal Medicine

## 2020-10-25 ENCOUNTER — Other Ambulatory Visit: Payer: Self-pay

## 2020-10-25 ENCOUNTER — Encounter: Payer: Self-pay | Admitting: Internal Medicine

## 2020-10-25 ENCOUNTER — Ambulatory Visit (INDEPENDENT_AMBULATORY_CARE_PROVIDER_SITE_OTHER): Payer: PPO | Admitting: Internal Medicine

## 2020-10-25 VITALS — BP 140/72 | HR 88 | Ht 68.0 in | Wt 256.0 lb

## 2020-10-25 DIAGNOSIS — E1142 Type 2 diabetes mellitus with diabetic polyneuropathy: Secondary | ICD-10-CM

## 2020-10-25 DIAGNOSIS — G894 Chronic pain syndrome: Secondary | ICD-10-CM | POA: Diagnosis not present

## 2020-10-25 DIAGNOSIS — E1159 Type 2 diabetes mellitus with other circulatory complications: Secondary | ICD-10-CM | POA: Diagnosis not present

## 2020-10-25 DIAGNOSIS — E1165 Type 2 diabetes mellitus with hyperglycemia: Secondary | ICD-10-CM

## 2020-10-25 LAB — GLUCOSE, POCT (MANUAL RESULT ENTRY): POC Glucose: 362 mg/dl — AB (ref 70–99)

## 2020-10-25 MED ORDER — DAPAGLIFLOZIN PROPANEDIOL 5 MG PO TABS: 5.0000 mg | ORAL_TABLET | Freq: Every day | ORAL | 6 refills | Status: DC

## 2020-10-25 NOTE — Progress Notes (Signed)
Faxed application for patient with [employer] for Humalog, Basaglar, and Trulicity. Sent copy to scan.

## 2020-10-25 NOTE — Progress Notes (Signed)
Faxed [facility] ([facility] [employer]) form new order for Libre 2 since [place] 14 day have been discontinued on 10/21/2020.  Sent copy to scan

## 2020-10-25 NOTE — Patient Instructions (Signed)
-   Increase tresiba to 30 units daily ( This will switch to [employer]) - Increase Novolog 20 units with Breskfast and 16 units with Lunch and Supper ( This will switch to Humalog) - Continue Ozempic 0.5 mg weekly ( this will switch to Trulicity 1.5 mg weekly ) - start Farxiga 5 mg , 1 tablet with Breakfast ( when you get approved      HOW TO TREAT LOW BLOOD SUGARS (Blood sugar LESS THAN 70 MG/DL)  Please follow the RULE OF 15 for the treatment of hypoglycemia treatment (when your (blood sugars are less than 70 mg/dL)    STEP 1: Take 15 grams of carbohydrates when your blood sugar is low, which includes:   3-4 GLUCOSE TABS  OR  3-4 OZ OF JUICE OR REGULAR SODA OR  ONE TUBE OF GLUCOSE GEL     STEP 2: RECHECK blood sugar in 15 MINUTES STEP 3: If your blood sugar is still low at the 15 minute recheck --> then, go back to STEP 1 and treat AGAIN with another 15 grams of carbohydrates.
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
## 2020-10-30 ENCOUNTER — Encounter: Payer: Self-pay | Admitting: Internal Medicine

## 2020-10-30 DIAGNOSIS — M179 Osteoarthritis of knee, unspecified: Secondary | ICD-10-CM | POA: Diagnosis not present

## 2020-10-30 DIAGNOSIS — M19011 Primary osteoarthritis, right shoulder: Secondary | ICD-10-CM | POA: Diagnosis not present

## 2020-10-30 DIAGNOSIS — M5136 Other intervertebral disc degeneration, lumbar region: Secondary | ICD-10-CM | POA: Diagnosis not present

## 2020-10-30 DIAGNOSIS — E11610 Type 2 diabetes mellitus with diabetic neuropathic arthropathy: Secondary | ICD-10-CM | POA: Diagnosis not present

## 2020-10-31 ENCOUNTER — Other Ambulatory Visit: Payer: Self-pay | Admitting: Physician Assistant

## 2020-10-31 ENCOUNTER — Other Ambulatory Visit: Payer: Self-pay | Admitting: Nurse Practitioner

## 2020-10-31 NOTE — Progress Notes (Signed)
Application for Farxiga 5 mg was faxed to [place] ([employer])  A copy have been sent to scan

## 2020-11-02 ENCOUNTER — Other Ambulatory Visit: Payer: Self-pay | Admitting: Nurse Practitioner

## 2020-11-04 ENCOUNTER — Telehealth: Payer: Self-pay

## 2020-11-04 NOTE — Telephone Encounter (Signed)
Copied from [place] [id number]. Topic: General - Other >> Nov 04, 2020  2:12 PM Hinda Lenis D wrote: PT need a refill on a medications he doesn't remember the name / please advise  Attempted to call pt for clarification pt did not answer FYI.

## 2020-11-05 NOTE — Telephone Encounter (Signed)
Called patient no answer, left message regarding medication refill. Advised patient to call office back with name of medication he needs refilled.

## 2020-11-06 ENCOUNTER — Other Ambulatory Visit: Payer: Self-pay | Admitting: Nurse Practitioner

## 2020-11-06 ENCOUNTER — Telehealth: Payer: Self-pay

## 2020-11-06 MED ORDER — ISOSORBIDE MONONITRATE ER 60 MG PO TB24: 60.0000 mg | ORAL_TABLET | Freq: Every day | ORAL | 4 refills | Status: DC

## 2020-11-06 NOTE — Telephone Encounter (Signed)
Med refill, patient is completely out

## 2020-11-06 NOTE — Telephone Encounter (Signed)
Pt here in office with name of the medication ISOBORB MONO rx 60 MG TAB PT REQUEST MED REFILL to be sent to phamacy has apt on 12/06/2020

## 2020-11-06 NOTE — Telephone Encounter (Signed)
Sent in refills, last visit with cardiology in December was still taking 60 MG daily on review.

## 2020-11-06 NOTE — Telephone Encounter (Signed)
Copied from [place] [phone]. Topic: General - Other >> Nov 06, 2020 10:24 AM Yvette Rack wrote: Reason for CRM: Pt returned call to the office. Pt requests call back.

## 2020-11-06 NOTE — Telephone Encounter (Signed)
Called and left a message for patient to return my call.  

## 2020-11-13 DIAGNOSIS — Z20822 Contact with and (suspected) exposure to covid-19: Secondary | ICD-10-CM | POA: Diagnosis not present

## 2020-11-13 DIAGNOSIS — Z03818 Encounter for observation for suspected exposure to other biological agents ruled out: Secondary | ICD-10-CM | POA: Diagnosis not present

## 2020-11-18 ENCOUNTER — Encounter: Payer: Self-pay | Admitting: Nurse Practitioner

## 2020-11-18 ENCOUNTER — Telehealth (INDEPENDENT_AMBULATORY_CARE_PROVIDER_SITE_OTHER): Payer: PPO | Admitting: Nurse Practitioner

## 2020-11-18 ENCOUNTER — Other Ambulatory Visit: Payer: Self-pay

## 2020-11-18 DIAGNOSIS — U071 COVID-19: Secondary | ICD-10-CM | POA: Diagnosis not present

## 2020-11-18 NOTE — Patient Instructions (Signed)

## 2020-11-18 NOTE — Progress Notes (Addendum)
   There were no vitals taken for this visit.   Subjective:    Patient ID: Edward Witzke., male    DOB: 1956/09/12, 65 y.o.   MRN: [id number]  HPI: Edward Slauson. is a 65 y.o. male  Chief Complaint  Patient presents with  . Covid Positive    Pt states he was tested for covid last Wednesday due to his wife having a cold, got a positive result. States he has not had and still does not have any symptoms.    UPPER RESPIRATORY TRACT INFECTION Patient was tested for COVID last week and tested positive.  Patient states he was sick two weeks prior to that.  He states he is feeling normal today.   Worst symptom: Fever: no Cough: no Shortness of breath: no Wheezing: no Chest pain: no Chest tightness: no Chest congestion: no Nasal congestion: no Runny nose: no Post nasal drip: no Sneezing: no Sore throat: yes Swollen glands: no Sinus pressure: no Headache: no Face pain: no Toothache: no Ear pain: no  Ear pressure: no  Eyes red/itching:no Eye drainage/crusting: no  Vomiting: no Rash: no Fatigue: no Sick contacts: no Strep contacts: no  Context: Better.  Patient states he feels great today. Recurrent sinusitis: no Relief with OTC cold/cough medications: no  Treatments attempted: none   Relevant past medical, surgical, family and social history reviewed and updated as indicated. Interim medical history since our last visit reviewed. Allergies and medications reviewed and updated.  Review of Systems  HENT: Positive for sore throat.   Respiratory: Negative for cough, chest tightness, shortness of breath and wheezing.   Cardiovascular: Negative for chest pain.    Per HPI unless specifically indicated above     Objective:    There were no vitals taken for this visit.  Wt Readings from Last 3 Encounters:  10/25/20 256 lb (116.1 kg)  09/20/20 257 lb (116.6 kg)  09/02/20 248 lb 3.2 oz (112.6 kg)    Physical Exam  Results for orders placed or performed in visit on  10/25/20  POCT Glucose (CBG)  Result Value Ref Range   POC Glucose 362 (A) 70 - 99 mg/dl      Assessment & Plan:   Problem List Items Addressed This Visit   None   Visit Diagnoses    Lab test positive for detection of COVID-19 virus    -  Primary   Patient tested positive last week.  Due to improved symptoms will not treat at this time.  Discussed proper quarantine time and symptoms to monitor for.       Follow up plan: Return if symptoms worsen or fail to improve.     This visit was completed via MyChart due to the restrictions of the COVID-19 pandemic. All issues as above were discussed and addressed. Physical exam was done as above through visual confirmation on MyChart. If it was felt that the patient should be evaluated in the office, they were directed there. The patient verbally consented to this visit. 1. Location of the patient: Home 2. Location of the provider: Office 3. Those involved with this call:  ? Provider: Jon Billings, NP ? CMA: Carroll Kinds, CMA ? Front Desk/Registration: Jill Side 4. Time spent on call: 15 minutes with patient face to face via video conference. More than 50% of this time was spent in counseling and coordination of care. 22 minutes total spent in review of patient's record and preparation of their chart.

## 2020-11-20 ENCOUNTER — Other Ambulatory Visit: Payer: Self-pay | Admitting: Nurse Practitioner

## 2020-11-24 DIAGNOSIS — M545 Low back pain, unspecified: Secondary | ICD-10-CM | POA: Diagnosis not present

## 2020-11-24 DIAGNOSIS — G894 Chronic pain syndrome: Secondary | ICD-10-CM | POA: Diagnosis not present

## 2020-11-24 DIAGNOSIS — M5136 Other intervertebral disc degeneration, lumbar region: Secondary | ICD-10-CM | POA: Diagnosis not present

## 2020-11-27 DIAGNOSIS — M5136 Other intervertebral disc degeneration, lumbar region: Secondary | ICD-10-CM | POA: Diagnosis not present

## 2020-11-27 DIAGNOSIS — G894 Chronic pain syndrome: Secondary | ICD-10-CM | POA: Diagnosis not present

## 2020-11-27 DIAGNOSIS — M545 Low back pain, unspecified: Secondary | ICD-10-CM | POA: Diagnosis not present

## 2020-12-06 ENCOUNTER — Encounter: Payer: Self-pay | Admitting: Nurse Practitioner

## 2020-12-06 ENCOUNTER — Other Ambulatory Visit: Payer: Self-pay

## 2020-12-06 ENCOUNTER — Ambulatory Visit (INDEPENDENT_AMBULATORY_CARE_PROVIDER_SITE_OTHER): Payer: PPO | Admitting: Nurse Practitioner

## 2020-12-06 VITALS — BP 128/68 | HR 98 | Temp 98.9°F | Ht 67.6 in | Wt 246.0 lb

## 2020-12-06 DIAGNOSIS — G4733 Obstructive sleep apnea (adult) (pediatric): Secondary | ICD-10-CM | POA: Diagnosis not present

## 2020-12-06 DIAGNOSIS — E1165 Type 2 diabetes mellitus with hyperglycemia: Secondary | ICD-10-CM

## 2020-12-06 DIAGNOSIS — E1142 Type 2 diabetes mellitus with diabetic polyneuropathy: Secondary | ICD-10-CM | POA: Diagnosis not present

## 2020-12-06 DIAGNOSIS — E785 Hyperlipidemia, unspecified: Secondary | ICD-10-CM | POA: Diagnosis not present

## 2020-12-06 DIAGNOSIS — J432 Centrilobular emphysema: Secondary | ICD-10-CM

## 2020-12-06 DIAGNOSIS — I152 Hypertension secondary to endocrine disorders: Secondary | ICD-10-CM | POA: Diagnosis not present

## 2020-12-06 DIAGNOSIS — E1159 Type 2 diabetes mellitus with other circulatory complications: Secondary | ICD-10-CM

## 2020-12-06 DIAGNOSIS — I700 Atherosclerosis of aorta: Secondary | ICD-10-CM | POA: Diagnosis not present

## 2020-12-06 DIAGNOSIS — G8929 Other chronic pain: Secondary | ICD-10-CM

## 2020-12-06 DIAGNOSIS — E1169 Type 2 diabetes mellitus with other specified complication: Secondary | ICD-10-CM | POA: Diagnosis not present

## 2020-12-06 DIAGNOSIS — M5441 Lumbago with sciatica, right side: Secondary | ICD-10-CM

## 2020-12-06 DIAGNOSIS — E538 Deficiency of other specified B group vitamins: Secondary | ICD-10-CM

## 2020-12-06 DIAGNOSIS — I25118 Atherosclerotic heart disease of native coronary artery with other forms of angina pectoris: Secondary | ICD-10-CM

## 2020-12-06 DIAGNOSIS — Z6837 Body mass index (BMI) 37.0-37.9, adult: Secondary | ICD-10-CM

## 2020-12-06 DIAGNOSIS — F321 Major depressive disorder, single episode, moderate: Secondary | ICD-10-CM

## 2020-12-06 DIAGNOSIS — M5442 Lumbago with sciatica, left side: Secondary | ICD-10-CM | POA: Diagnosis not present

## 2020-12-06 DIAGNOSIS — D692 Other nonthrombocytopenic purpura: Secondary | ICD-10-CM | POA: Insufficient documentation

## 2020-12-06 NOTE — Assessment & Plan Note (Signed)
Chronic, ongoing.  Continue current inhaler regimen, Stiolto through assistance program received and Albuterol - Birdena Crandall PharmD on this.  Continue collaboration with pulmonary.  Recommend focus on modest weight loss.  Return in 3 months.

## 2020-12-06 NOTE — Assessment & Plan Note (Signed)
Chronic, ongoing.  Continue current medication regimen and adjust as needed. Lipid panel today. 

## 2020-12-06 NOTE — Assessment & Plan Note (Signed)
Chronic, ongoing, followed by pain management (Dr. Primus Bravo).  Continue current medication regimen as prescribed by this provider and Duloxetine for mood and pain.

## 2020-12-06 NOTE — Assessment & Plan Note (Signed)
Chronic, ongoing will continue to collaborate with endo.  A1C last visit 8.9%, recheck today as not checked at recent endo visit.  Continue Duloxetine and collaboration with pain management + collaboration with endocrinology and current medication regimen. + collaboration with CCM team.  Recommend continue to monitor BS QID with [name] and focus heavily on diet and modest weight loss.  Bring Libre to visits.  Return in 3 months.

## 2020-12-06 NOTE — Assessment & Plan Note (Signed)
Chronic, ongoing secondary to chronic diseases and pain.  Continue Duloxetine, which benefits mood and pain, plus Trazodone for sleep.  Refuses psychiatry or therapy referral.  Will continue current regimen and adjust as needed.  If SI presents he is to immediately go to ER, which he agrees with.  Return to office in 3 months, sooner if worsening mood.

## 2020-12-06 NOTE — Progress Notes (Signed)
BP 128/68   Pulse 98   Temp 98.9 F (37.2 C) (Oral)   Ht 5' 7.6" (1.717 m)   Wt 246 lb (111.6 kg)   SpO2 97%   BMI 37.85 kg/m    Subjective:    Patient ID: Edward Schneider., male    DOB: 1955-12-15, 65 y.o.   MRN: [id number]  HPI: Edward Schneider. is a 65 y.o. male  Chief Complaint  Patient presents with  . Diabetes    Patient has been having hot flashes for the past 2 months.  . Hypertension   DIABETES Sees endocrinology and last saw1/7/22 with A1C8.9% in November, Dr. Kelton Pillar.To continueinsulin Tresiba 30 units and Novlog20 units with breakfast andthen 16 with lunch andsupper. Taking Ozempic 0.5 MG weekly and Farxiga 5 MG.Is using Libre, did not bring it with him today.  Reports his sugars have been 107 to 240 range.  Has lost 10 pounds since last visit -- focused on diet.  Had Covid in 11/18/20 -- overall feels better now with no long term effects.  Has quit cookies and soft drinks. Hypoglycemic episodes:no Polydipsia/polyuria:no Visual disturbance:no Chest pain:no Paresthesias:no Glucose Monitoring:yes Accucheck frequency: QID Fasting glucose: Post prandial: Evening: Before meals: Taking Insulin?:yes Long acting insulin: Tresiba30units Short acting insulin: Novlog20 units with breakfast andthen 16 with lunch andsupper Blood Pressure Monitoring:daily Retinal Examination:Up to Date Foot Exam:Up to Date Pneumovax:Up to Date Influenza:Up to Date Aspirin:yes  HYPERTENSION / HYPERLIPIDEMIA Followed by cardiology and last saw12/3/21 -- taken off Plavix. Last echo February 2021 = EF 60-65% with normal left ventricular diastolic parameters.Continues Atorvastatin 80 MG.  Takes Benazepril 20 MG, Doxepin 25 MG for HTN.   Satisfied with current treatment?yes Duration of hypertension:chronic BP monitoring frequency:daily BP range:120/80 on  average BP medication side effects:no Duration of hyperlipidemia:chronic Cholesterol medication side effects:no Cholesterol supplements: none Medication compliance:good compliance Aspirin:yes Recent stressors:no Recurrent headaches:no Visual changes:no Palpitations:no Dyspnea: at baseline, no worsening Chest pain: none recent Lower extremity edema:no Dizzy/lightheaded:no The ASCVD Risk score [name], et al., 2013) failed to calculate for the following reasons:   The patient has a prior MI or stroke diagnosis  COPD Continues Stiolto and Albuterol -- he is out of these, gets them via assistance. Does use CPAP and uses 100% of the time.Smoked for 48 years, was 6-7 when started. Smoked 3 cartons to a carton and a half a week. Quit in 2012. Goes for annual lung screening, last went July 2021. Last saw pulmonary on 07/01/20 -- goes back in 3 weeks.  COPD status:stable Satisfied with current treatment?:yes Oxygen use:no Dyspnea frequency:at baseline Cough frequency:minimal Rescue inhaler frequency:using three times a day Limitation of activity:no Productive cough:none Last Spirometry:with pulmonary Pneumovax:Up to Date Influenza:Up to Date  CHRONICPAIN Pain medicine provider, Dr. Primus Bravo and last saw last Wednesday Takes Oxycodone as needed.  Currently being followed by ortho for and is working on weight and A1c to get hip surgery both sides and both knees and both shoulders.  A1c needs to be 8 or lower for surgery. Duration:chronic Mechanism of injury:unknown Location:low back Onset:gradual Severity:10/10 Quality:sharp and aching Frequency:intermittent Radiation:R leg below the knee and L leg below the knee Aggravating factors:lifting, movement, walking and bending Alleviating factors: nothing Status:worse Treatments attempted:Oxycodone Relief with NSAIDs?:No NSAIDs Taken Nighttime pain:at times Paresthesias / decreased  sensation:yes Bowel / bladder incontinence:no Fevers:no Dysuria / urinary frequency:no  DEPRESSION Continues on Duloxetine 90 MG daily and Trazodone.  Mood status:stable Satisfied with current treatment?:yes Symptom severity:moderate Duration  of current treatment :chronic Side effects:no Medication compliance:good compliance Psychotherapy/counseling:none Depressed mood:yes Anxious mood:no Anhedonia:no Significant weight loss or gain:no Insomnia:yeshard to fall asleep Fatigue:no Feelings of worthlessness or guilt:yes Impaired concentration/indecisiveness:yes Suicidal ideations:as above Hopelessness:no Crying spells:no Depression screen [facility] 2/9 12/06/2020 09/02/2020 05/23/2020 05/21/2020 03/06/2020  Decreased Interest 0 0 1 2 0  Down, Depressed, Hopeless 0 1 3 1 3   PHQ - 2 Score 0 1 4 3 3   Altered sleeping 0 3 1 3 3   Tired, decreased energy 3 3 3 3  0  Change in appetite 0 0 1 3 1   Feeling bad or failure about yourself  0 0 0 0 1  Trouble concentrating 0 0 0 0 0  Moving slowly or fidgety/restless 0 0 0 1 0  Suicidal thoughts 0 0 0 0 1  PHQ-9 Score 3 7 9 13 9   Difficult doing work/chores - Not difficult at all Somewhat difficult Very difficult Somewhat difficult  Some recent data might be hidden   Relevant past medical, surgical, family and social history reviewed and updated as indicated. Interim medical history since our last visit reviewed. Allergies and medications reviewed and updated.  Review of Systems  Constitutional: Negative.   Respiratory: Negative.   Cardiovascular: Negative.   Gastrointestinal: Negative.   Musculoskeletal: Positive for arthralgias.  Neurological: Negative.     Per HPI unless specifically indicated above     Objective:    BP 128/68   Pulse 98   Temp 98.9 F (37.2 C) (Oral)   Ht 5' 7.6" (1.717 m)   Wt 246 lb (111.6 kg)   SpO2 97%   BMI 37.85 kg/m   Wt Readings from Last 3 Encounters:  12/06/20 246 lb  (111.6 kg)  10/25/20 256 lb (116.1 kg)  09/20/20 257 lb (116.6 kg)    Physical Exam Vitals and nursing note reviewed.  Constitutional:      General: He is awake. He is not in acute distress.    Appearance: He is well-developed and well-groomed. He is obese. He is not ill-appearing.  HENT:     Head: Normocephalic and atraumatic.     Right Ear: Hearing normal. No drainage.     Left Ear: Hearing normal. No drainage.  Eyes:     General: Lids are normal.        Right eye: No discharge.        Left eye: No discharge.     Conjunctiva/sclera: Conjunctivae normal.     Pupils: Pupils are equal, round, and reactive to light.  Neck:     Thyroid: No thyromegaly.     Vascular: No carotid bruit.     Trachea: Trachea normal.  Cardiovascular:     Rate and Rhythm: Normal rate and regular rhythm.     Heart sounds: Normal heart sounds, S1 normal and S2 normal. No murmur heard. No gallop.   Pulmonary:     Effort: Pulmonary effort is normal. No accessory muscle usage or respiratory distress.     Breath sounds: Normal breath sounds.  Abdominal:     General: Bowel sounds are normal.     Palpations: Abdomen is soft.  Musculoskeletal:        General: Normal range of motion.     Cervical back: Normal range of motion and neck supple.     Right lower leg: No edema.     Left lower leg: No edema.  Skin:    General: Skin is warm and dry.     Capillary Refill: Capillary  refill takes less than 2 seconds.     Findings: No rash.     Comments: Scattered pale bruising to upper extremities and abrasions -- baseline.  Neurological:     Mental Status: He is alert and oriented to person, place, and time.     Deep Tendon Reflexes: Reflexes are normal and symmetric.  Psychiatric:        Attention and Perception: Attention normal.        Mood and Affect: Mood normal.        Behavior: Behavior normal. Behavior is cooperative.        Thought Content: Thought content normal.    Results for orders placed or  performed in visit on 10/25/20  POCT Glucose (CBG)  Result Value Ref Range   POC Glucose 362 (A) 70 - 99 mg/dl      Assessment & Plan:   Problem List Items Addressed This Visit      Cardiovascular and Mediastinum   Hypertension associated with diabetes ([place])    Chronic, stable with BP at goal.  Continue current medication regimen and adjust as needed + continue collaboration with cardiology team.  Recommend he continue to monitor BP closely at home and document + bring levels to visit.  DASH diet at home.  BMP and TSH today.  Return in 3 months.      Relevant Orders   Comprehensive metabolic panel   TSH   HgB A1c   Atherosclerosis of abdominal aorta (HCC)    Noted on CT scan in 2016, continue daily statin and ASA for prevention.  Recommend modest weight loss.      Coronary artery disease of native artery of native heart with stable angina pectoris ([place])    Followed by cardiology.  Continue current medication regimen and collaboration with cardiology.        Senile purpura ([place])    As evidenced by bruising bilateral upper extremities and ASA use.  Recommend gentle skin care at home and monitor for skin breakdown, if presents immediately notify provider.        Respiratory   Emphysema lung (HCC)    Chronic, ongoing.  Continue current inhaler regimen, Stiolto through assistance program received and Albuterol - Birdena Crandall PharmD on this.  Continue collaboration with pulmonary.  Recommend focus on modest weight loss.  Return in 3 months.      Sleep apnea    Chronic, continue 100% use of CPAP at home.        Endocrine   Hyperlipidemia associated with type 2 diabetes mellitus (HCC)    Chronic, ongoing.  Continue current medication regimen and adjust as needed.  Lipid panel today.       Relevant Orders   Lipid Panel w/o Chol/HDL Ratio   HgB A1c   DM type 2 with diabetic peripheral neuropathy (HCC)    Chronic, ongoing will continue to collaborate with endo.  A1C last  visit 8.9%, recheck today as not checked at recent endo visit.  Continue Duloxetine and collaboration with pain management + collaboration with endocrinology and current medication regimen. + collaboration with CCM team.  Recommend continue to monitor BS QID with [name] and focus heavily on diet and modest weight loss.  Bring Libre to visits.  Return in 3 months.      Uncontrolled type 2 diabetes mellitus with hyperglycemia (HCC) - Primary    Chronic, ongoing will continue to collaborate with endo.  A1C last visit 8.9%, recheck today as not checked at recent endo  visit.  Urine ALB in August 2021 = 80, continue Benazepril for kidney protection.  Continue collaboration with endocrinology and current medication regimen.  Continue collaboration with CCM team.  Recommend continue to monitor BS QID with [name] and focus heavily on diet and modest weight loss.  Bring Libre to visits.  Return in 3 months.        Other   Chronic back pain    Chronic, ongoing, followed by pain management (Dr. Primus Bravo).  Continue current medication regimen as prescribed by this provider and Duloxetine for mood and pain.       Morbid obesity (HCC)    BMI 37.85 with recent loss, praised for loss and heavy diet focus.  Recommended eating smaller high protein, low fat meals more frequently and exercising 30 mins a day 5 times a week with a goal of 10-15lb weight loss in the next 3 months. Patient voiced their understanding and motivation to adhere to these recommendations.       Depression, major, single episode, moderate (HCC)    Chronic, ongoing secondary to chronic diseases and pain.  Continue Duloxetine, which benefits mood and pain, plus Trazodone for sleep.  Refuses psychiatry or therapy referral.  Will continue current regimen and adjust as needed.  If SI presents he is to immediately go to ER, which he agrees with.  Return to office in 3 months, sooner if worsening mood.      BMI 37.0-37.9, adult    Refer to morbid  obesity plan of care.       Other Visit Diagnoses    B12 deficiency       Reports history of low levels, check today and start supplement at needed.   Relevant Orders   Vitamin B12       Follow up plan: Return in about 3 months (around 03/05/2021) for T2DM, HTN/HLD, MOOD, COPD, PAIN, GERD.

## 2020-12-06 NOTE — Assessment & Plan Note (Addendum)
Chronic, stable with BP at goal.  Continue current medication regimen and adjust as needed + continue collaboration with cardiology team.  Recommend he continue to monitor BP closely at home and document + bring levels to visit.  DASH diet at home.  BMP and TSH today.  Return in 3 months.

## 2020-12-06 NOTE — Assessment & Plan Note (Signed)
Noted on CT scan in 2016, continue daily statin and ASA for prevention.  Recommend modest weight loss.

## 2020-12-06 NOTE — Assessment & Plan Note (Signed)
As evidenced by bruising bilateral upper extremities and ASA use.  Recommend gentle skin care at home and monitor for skin breakdown, if presents immediately notify provider.

## 2020-12-06 NOTE — Assessment & Plan Note (Signed)
Followed by cardiology.  Continue current medication regimen and collaboration with cardiology.

## 2020-12-06 NOTE — Assessment & Plan Note (Addendum)
Chronic, ongoing will continue to collaborate with endo.  A1C last visit 8.9%, recheck today as not checked at recent endo visit.  Urine ALB in August 2021 = 80, continue Benazepril for kidney protection.  Continue collaboration with endocrinology and current medication regimen.  Continue collaboration with CCM team.  Recommend continue to monitor BS QID with [name] and focus heavily on diet and modest weight loss.  Bring Libre to visits.  Return in 3 months.

## 2020-12-06 NOTE — Patient Instructions (Signed)
Diabetes Mellitus and Nutrition, Adult When you have diabetes, or diabetes mellitus, it is very important to have healthy eating habits because your blood sugar (glucose) levels are greatly affected by what you eat and drink. Eating healthy foods in the right amounts, at about the same times every day, can help you:  Control your blood glucose.  Lower your risk of heart disease.  Improve your blood pressure.  Reach or maintain a healthy weight. What can affect my meal plan? Every person with diabetes is different, and each person has different needs for a meal plan. Your health care provider may recommend that you work with a dietitian to make a meal plan that is best for you. Your meal plan may vary depending on factors such as:  The calories you need.  The medicines you take.  Your weight.  Your blood glucose, blood pressure, and cholesterol levels.  Your activity level.  Other health conditions you have, such as heart or kidney disease. How do carbohydrates affect me? Carbohydrates, also called carbs, affect your blood glucose level more than any other type of food. Eating carbs naturally raises the amount of glucose in your blood. Carb counting is a method for keeping track of how many carbs you eat. Counting carbs is important to keep your blood glucose at a healthy level, especially if you use insulin or take certain oral diabetes medicines. It is important to know how many carbs you can safely have in each meal. This is different for every person. Your dietitian can help you calculate how many carbs you should have at each meal and for each snack. How does alcohol affect me? Alcohol can cause a sudden decrease in blood glucose (hypoglycemia), especially if you use insulin or take certain oral diabetes medicines. Hypoglycemia can be a life-threatening condition. Symptoms of hypoglycemia, such as sleepiness, dizziness, and confusion, are similar to symptoms of having too much  alcohol.  Do not drink alcohol if: ? Your health care provider tells you not to drink. ? You are pregnant, may be pregnant, or are planning to become pregnant.  If you drink alcohol: ? Do not drink on an empty stomach. ? Limit how much you use to:  0-1 drink a day for women.  0-2 drinks a day for men. ? Be aware of how much alcohol is in your drink. In the [place] one drink equals one 12 oz bottle of beer (355 mL), one 5 oz glass of wine (148 mL), or one 1 oz glass of hard liquor (44 mL). ? Keep yourself hydrated with water, diet soda, or unsweetened iced tea.  Keep in mind that regular soda, juice, and other mixers may contain a lot of sugar and must be counted as carbs. What are tips for following this plan? Reading food labels  Start by checking the serving size on the "Nutrition Facts" label of packaged foods and drinks. The amount of calories, carbs, fats, and other nutrients listed on the label is based on one serving of the item. Many items contain more than one serving per package.  Check the total grams (g) of carbs in one serving. You can calculate the number of servings of carbs in one serving by dividing the total carbs by 15. For example, if a food has 30 g of total carbs per serving, it would be equal to 2 servings of carbs.  Check the number of grams (g) of saturated fats and trans fats in one serving. Choose foods that have   a low amount or none of these fats.  Check the number of milligrams (mg) of salt (sodium) in one serving. Most people should limit total sodium intake to less than 2,300 mg per day.  Always check the nutrition information of foods labeled as "low-fat" or "nonfat." These foods may be higher in added sugar or refined carbs and should be avoided.  Talk to your dietitian to identify your daily goals for nutrients listed on the label. Shopping  Avoid buying canned, pre-made, or processed foods. These foods tend to be high in fat, sodium, and added  sugar.  Shop around the outside edge of the grocery store. This is where you will most often find fresh fruits and vegetables, bulk grains, fresh meats, and fresh dairy. Cooking  Use low-heat cooking methods, such as baking, instead of high-heat cooking methods like deep frying.  Cook using healthy oils, such as olive, canola, or sunflower oil.  Avoid cooking with butter, cream, or high-fat meats. Meal planning  Eat meals and snacks regularly, preferably at the same times every day. Avoid going long periods of time without eating.  Eat foods that are high in fiber, such as fresh fruits, vegetables, beans, and whole grains. Talk with your dietitian about how many servings of carbs you can eat at each meal.  Eat 4-6 oz (112-168 g) of lean protein each day, such as lean meat, chicken, fish, eggs, or tofu. One ounce (oz) of lean protein is equal to: ? 1 oz (28 g) of meat, chicken, or fish. ? 1 egg. ?  cup (62 g) of tofu.  Eat some foods each day that contain healthy fats, such as avocado, nuts, seeds, and fish.   What foods should I eat? Fruits Berries. Apples. Oranges. Peaches. Apricots. Plums. Grapes. Mango. Papaya. Pomegranate. Kiwi. Cherries. Vegetables Lettuce. Spinach. Leafy greens, including kale, chard, collard greens, and mustard greens. Beets. Cauliflower. Cabbage. Broccoli. Carrots. Green beans. Tomatoes. Peppers. Onions. Cucumbers. Brussels sprouts. Grains Whole grains, such as whole-wheat or whole-grain bread, crackers, tortillas, cereal, and pasta. Unsweetened oatmeal. Quinoa. Brown or wild rice. Meats and other proteins Seafood. Poultry without skin. Lean cuts of poultry and beef. Tofu. Nuts. Seeds. Dairy Low-fat or fat-free dairy products such as milk, yogurt, and cheese. The items listed above may not be a complete list of foods and beverages you can eat. Contact a dietitian for more information. What foods should I avoid? Fruits Fruits canned with  syrup. Vegetables Canned vegetables. Frozen vegetables with butter or cream sauce. Grains Refined white flour and flour products such as bread, pasta, snack foods, and cereals. Avoid all processed foods. Meats and other proteins Fatty cuts of meat. Poultry with skin. Breaded or fried meats. Processed meat. Avoid saturated fats. Dairy Full-fat yogurt, cheese, or milk. Beverages Sweetened drinks, such as soda or iced tea. The items listed above may not be a complete list of foods and beverages you should avoid. Contact a dietitian for more information. Questions to ask a health care provider  Do I need to meet with a diabetes educator?  Do I need to meet with a dietitian?  What number can I call if I have questions?  When are the best times to check my blood glucose? Where to find more information:  American Diabetes Association: diabetes.org  Academy of Nutrition and Dietetics: www.eatright.org  National Institute of Diabetes and Digestive and Kidney Diseases: www.niddk.nih.gov  Association of Diabetes Care and Education Specialists: www.diabeteseducator.org Summary  It is important to have healthy eating   habits because your blood sugar (glucose) levels are greatly affected by what you eat and drink.  A healthy meal plan will help you control your blood glucose and maintain a healthy lifestyle.  Your health care provider may recommend that you work with a dietitian to make a meal plan that is best for you.  Keep in mind that carbohydrates (carbs) and alcohol have immediate effects on your blood glucose levels. It is important to count carbs and to use alcohol carefully. This information is not intended to replace advice given to you by your health care provider. Make sure you discuss any questions you have with your health care provider. Document Revised: 09/12/2019 Document Reviewed: 09/12/2019 Elsevier Patient Education  2021 Elsevier Inc.  

## 2020-12-06 NOTE — Assessment & Plan Note (Signed)
Chronic, continue 100% use of CPAP at home.

## 2020-12-06 NOTE — Assessment & Plan Note (Signed)
BMI 37.85 with recent loss, praised for loss and heavy diet focus.  Recommended eating smaller high protein, low fat meals more frequently and exercising 30 mins a day 5 times a week with a goal of 10-15lb weight loss in the next 3 months. Patient voiced their understanding and motivation to adhere to these recommendations.

## 2020-12-06 NOTE — Assessment & Plan Note (Signed)
Refer to morbid obesity plan of care. 

## 2020-12-07 LAB — LIPID PANEL W/O CHOL/HDL RATIO
Cholesterol, Total: 120 mg/dL (ref 100–199)
HDL: 46 mg/dL (ref >39)
LDL Chol Calc (NIH): 47 mg/dL (ref 0–99)
Triglycerides: 164 mg/dL — ABNORMAL HIGH (ref 0–149)
VLDL Cholesterol Cal: 27 mg/dL (ref 5–40)

## 2020-12-07 LAB — COMPREHENSIVE METABOLIC PANEL
ALT: 28 IU/L (ref 0–44)
AST: 35 IU/L (ref 0–40)
Albumin/Globulin Ratio: 1.6 (ref 1.2–2.2)
Albumin: 4.4 g/dL (ref 3.8–4.8)
Alkaline Phosphatase: 71 IU/L (ref 44–121)
BUN/Creatinine Ratio: 14 (ref 10–24)
BUN: 17 mg/dL (ref 8–27)
Bilirubin Total: 0.4 mg/dL (ref 0.0–1.2)
CO2: 19 mmol/L — ABNORMAL LOW (ref 20–29)
Calcium: 9.8 mg/dL (ref 8.6–10.2)
Chloride: 96 mmol/L (ref 96–106)
Creatinine, Ser: 1.19 mg/dL (ref 0.76–1.27)
GFR calc Af Amer: 75 mL/min/{1.73_m2} (ref >59)
GFR calc non Af Amer: 65 mL/min/{1.73_m2} (ref >59)
Globulin, Total: 2.8 g/dL (ref 1.5–4.5)
Glucose: 260 mg/dL — ABNORMAL HIGH (ref 65–99)
Potassium: 4 mmol/L (ref 3.5–5.2)
Sodium: 140 mmol/L (ref 134–144)
Total Protein: 7.2 g/dL (ref 6.0–8.5)

## 2020-12-07 LAB — VITAMIN B12: Vitamin B-12: 1345 pg/mL — ABNORMAL HIGH (ref 232–1245)

## 2020-12-07 LAB — TSH: TSH: 2.32 u[IU]/mL (ref 0.450–4.500)

## 2020-12-09 ENCOUNTER — Telehealth: Payer: Self-pay

## 2020-12-09 NOTE — Telephone Encounter (Signed)
Sent separate lab note.

## 2020-12-09 NOTE — Progress Notes (Signed)
Please let Edward Schneider know his labs have returned, I was waiting on A1c before sending out message but found out from lab today they did not get a tube for this.  He is welcome to come back in and have the finger stick done for his only and I can order, as it would be beneficial to have this.  I apologize for this inconvenience.  All the rest of his labs look great. Keep being awesome!!  Thank you for allowing me to participate in your care. Kindest regards, Won Kreuzer

## 2020-12-09 NOTE — Telephone Encounter (Signed)
Have you got these results back yet.

## 2020-12-09 NOTE — Telephone Encounter (Signed)
Copied from [place] [id number]. Topic: General - Inquiry >> Dec 09, 2020  9:44 AM Greggory Keen D wrote: Reason for CRM: {t called for the lab results from Fridays visit with Jolene.  CB#  [phone]

## 2020-12-10 ENCOUNTER — Other Ambulatory Visit: Payer: PPO

## 2020-12-10 ENCOUNTER — Other Ambulatory Visit: Payer: Self-pay

## 2020-12-10 DIAGNOSIS — I152 Hypertension secondary to endocrine disorders: Secondary | ICD-10-CM | POA: Diagnosis not present

## 2020-12-10 DIAGNOSIS — E1159 Type 2 diabetes mellitus with other circulatory complications: Secondary | ICD-10-CM

## 2020-12-10 LAB — BAYER DCA HB A1C WAIVED: HB A1C (BAYER DCA - WAIVED): 9.7 % — ABNORMAL HIGH (ref <7.0)

## 2020-12-10 NOTE — Progress Notes (Signed)
Please let Edward Schneider know his A1c returned and has crept up to 9.7%.  I suspect medications will be adjust at upcoming endocrinology visit, please ensure you are monitoring sugars 3-4 times daily and documenting.  Also focus heavily on diet changes.  Any questions?

## 2020-12-24 ENCOUNTER — Encounter: Payer: Self-pay | Admitting: Pulmonary Disease

## 2020-12-24 ENCOUNTER — Other Ambulatory Visit: Payer: Self-pay

## 2020-12-24 ENCOUNTER — Ambulatory Visit (INDEPENDENT_AMBULATORY_CARE_PROVIDER_SITE_OTHER): Payer: Medicare Other | Admitting: Pulmonary Disease

## 2020-12-24 VITALS — BP 120/62 | HR 111 | Temp 97.3°F | Ht 68.0 in | Wt 245.9 lb

## 2020-12-24 DIAGNOSIS — J449 Chronic obstructive pulmonary disease, unspecified: Secondary | ICD-10-CM | POA: Diagnosis not present

## 2020-12-24 DIAGNOSIS — Z9989 Dependence on other enabling machines and devices: Secondary | ICD-10-CM | POA: Diagnosis not present

## 2020-12-24 DIAGNOSIS — G4733 Obstructive sleep apnea (adult) (pediatric): Secondary | ICD-10-CM

## 2020-12-24 MED ORDER — STIOLTO RESPIMAT 2.5-2.5 MCG/ACT IN AERS: 2.0000 | INHALATION_SPRAY | Freq: Every day | RESPIRATORY_TRACT | 5 refills | Status: DC | PRN

## 2020-12-24 MED ORDER — ALBUTEROL SULFATE HFA 108 (90 BASE) MCG/ACT IN AERS: 2.0000 | INHALATION_SPRAY | Freq: Four times a day (QID) | RESPIRATORY_TRACT | 3 refills | Status: DC | PRN

## 2020-12-24 NOTE — Patient Instructions (Addendum)
Continue with Stiolto and albuterol as needed  You did have a CT scan of your chest in August 2021 that was normal-no need for an x-ray at present  I will see you back in 6 months  Call with significant concerns  Continue using your CPAP on a regular basis as well
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
## 2020-12-25 ENCOUNTER — Other Ambulatory Visit: Payer: Self-pay | Admitting: Internal Medicine

## 2020-12-26 ENCOUNTER — Other Ambulatory Visit: Payer: Self-pay

## 2020-12-26 ENCOUNTER — Ambulatory Visit: Payer: PPO | Admitting: Podiatry

## 2020-12-26 ENCOUNTER — Encounter: Payer: Self-pay | Admitting: Podiatry

## 2020-12-26 DIAGNOSIS — B351 Tinea unguium: Secondary | ICD-10-CM | POA: Diagnosis not present

## 2020-12-26 DIAGNOSIS — D689 Coagulation defect, unspecified: Secondary | ICD-10-CM | POA: Diagnosis not present

## 2020-12-26 DIAGNOSIS — M79674 Pain in right toe(s): Secondary | ICD-10-CM

## 2020-12-26 DIAGNOSIS — M79675 Pain in left toe(s): Secondary | ICD-10-CM

## 2020-12-26 DIAGNOSIS — E1142 Type 2 diabetes mellitus with diabetic polyneuropathy: Secondary | ICD-10-CM

## 2020-12-26 NOTE — Progress Notes (Signed)
This patient returns to my office for at risk foot care.  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  This patient is unable to cut nails himself since the patient cannot reach his nails.These nails are painful walking and wearing shoes.  This patient presents for at risk foot care today.  General Appearance  Alert, conversant and in no acute stress.  Vascular  Dorsalis pedis and posterior tibial  pulses are palpable  bilaterally.  Capillary return is within normal limits  bilaterally. Temperature is within normal limits  bilaterally.  Neurologic  Senn-Weinstein monofilament wire test within normal limits  bilaterally. Muscle power within normal limits bilaterally.  Nails Thick disfigured discolored nails with subungual debris  from hallux to fifth toes bilaterally. No evidence of bacterial infection or drainage bilaterally.  Orthopedic  No limitations of motion  feet .  No crepitus or effusions noted.  No bony pathology or digital deformities noted.  Skin  normotropic skin with no porokeratosis noted bilaterally.  No signs of infections or ulcers noted.     Onychomycosis  Pain in right toes  Pain in left toes  Consent was obtained for treatment procedures.   Mechanical debridement of nails 1-5  bilaterally performed with a nail nipper.  Filed with dremel without incident.    Return office visit   10 weeks                   Told patient to return for periodic foot care and evaluation due to potential at risk complications.   Gardiner Barefoot DPM

## 2020-12-30 IMAGING — CR DG CHEST 2V
2 series · 2 of 2 positions shown · non-contrast
Comparison: PA and lateral chest 12/16/2017.  CT chest 05/02/2019.

CLINICAL DATA: Left chest and upper and lower extremity pain,
numbness and tingling for the past week.

EXAM:
CHEST - 2 VIEW

[chest pa]
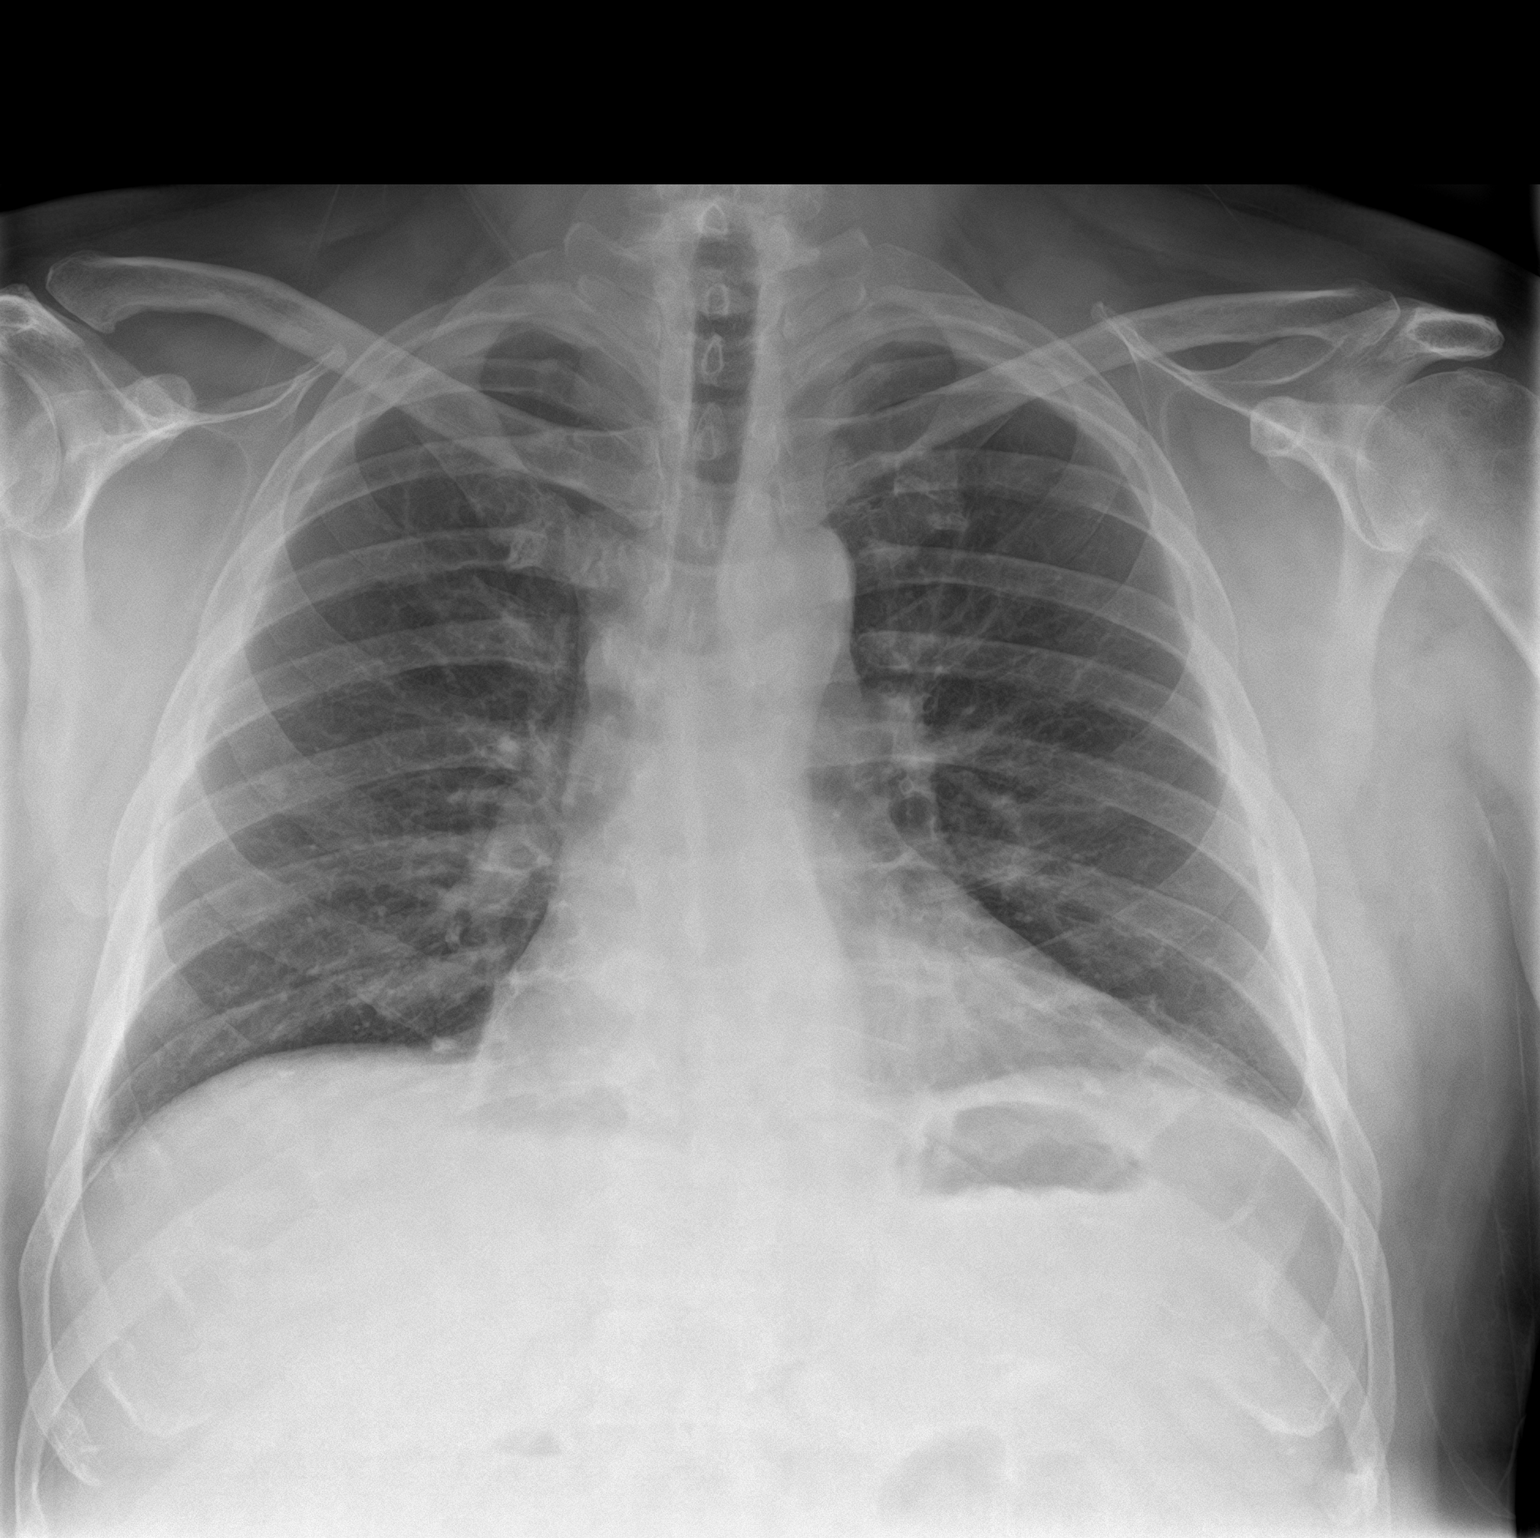

[chest lat]
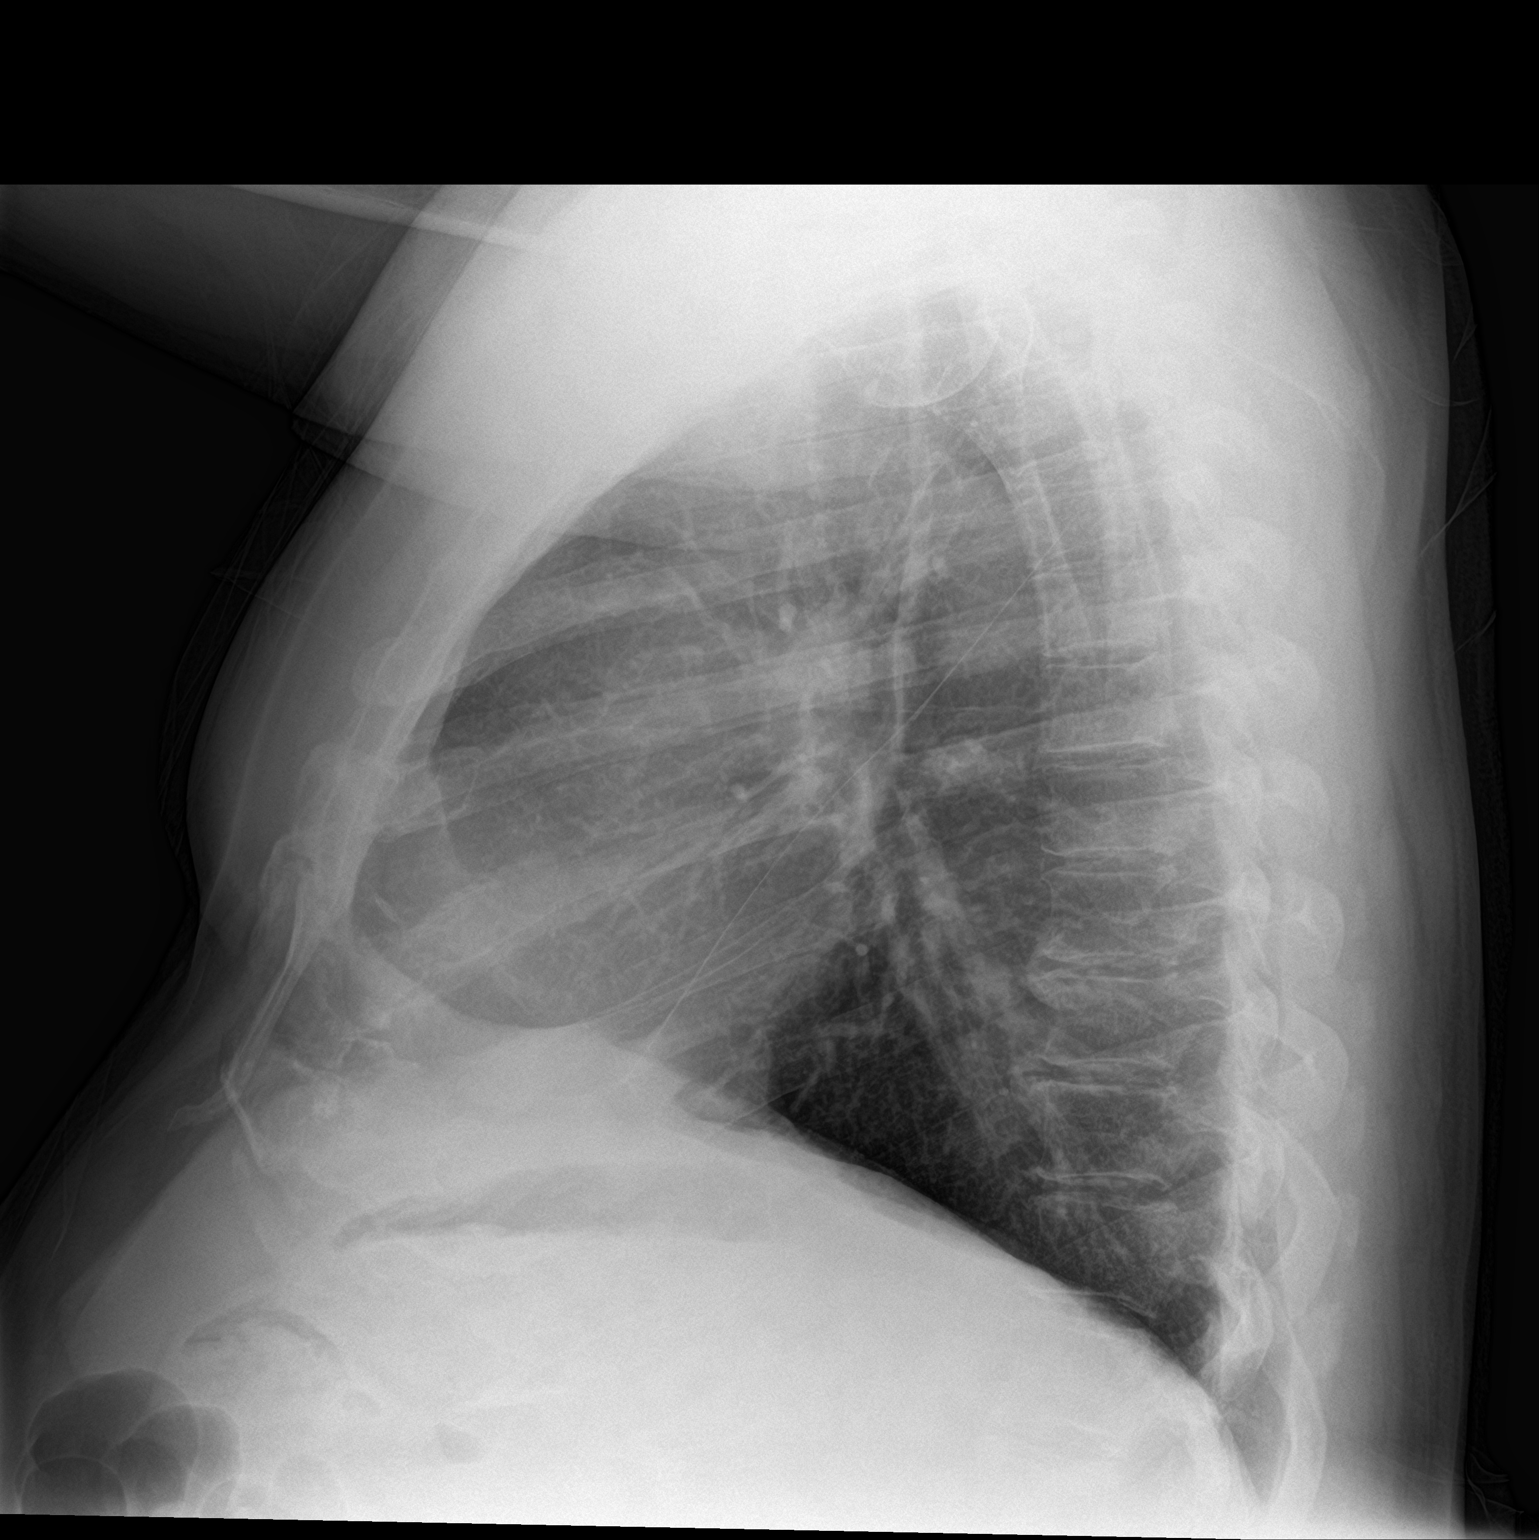

[2 of 2 positions shown; findings below may reference images not displayed]

FINDINGS: Minimal subsegmental atelectasis is seen in the left lung base.
Lungs otherwise clear. Heart size normal. No pneumothorax or pleural
effusion. No acute or focal bony abnormality.
IMPRESSION: No acute disease.

## 2021-01-07 ENCOUNTER — Ambulatory Visit: Payer: Self-pay | Admitting: *Deleted

## 2021-01-07 NOTE — Telephone Encounter (Signed)
2nd attempted to contact pt to discuss symptoms; unable to complete nurse triage at this time

## 2021-01-07 NOTE — Telephone Encounter (Signed)
Pt's wife called stating the device that checks his "body temp" has been low all weekend (13-30); she says the pt is having weakness; she is not with the pt and would like for him to be called; the pt can be contacted at [phone]; will attempt to contact pt.

## 2021-01-07 NOTE — Telephone Encounter (Signed)
If feeling better at this time recommend continue to monitor.  Unsure what monitor pain clinic provided him, but if issues with this I recommend he reach out to them.  If worsening symptoms then recommend visit to PCP or pain management office.

## 2021-01-07 NOTE — Telephone Encounter (Signed)
Called pt advised of Jolene's message. He states that he did reach out to pain management

## 2021-01-07 NOTE — Telephone Encounter (Signed)
Pt called back and states he spoke that his pain clinic regarding this issue; the pain clinic gave him a watch that tells" what his body is doing"; he was told that he needs to drink more liquids; the pt said he overdid it over the weekend; he says he "did not drink enough fluids says he and his body energy level was down to 3"; the pt says he "is feeling better'"; will route to office for notification of this encounter.  Reason for Disposition . [1] Follow-up call to recent contact AND [2] information only call, no triage required  Answer Assessment - Initial Assessment Questions 1. REASON FOR CALL or QUESTION: "What is your reason for calling today?" or "How can I best help you?" or "What question do you have that I can help answer?"     Wife previously called stating sensor is reading low  Protocols used: [place]

## 2021-01-07 NOTE — Telephone Encounter (Signed)
Attempted to contact pt to discuss symptoms; left message on voicemail; unable to complete nurse triage at this time; the pt is seen by Marnee Guarneri at [facility]; will route to office for notification of encounter.

## 2021-01-13 ENCOUNTER — Telehealth: Payer: Self-pay | Admitting: Nurse Practitioner

## 2021-01-13 NOTE — Telephone Encounter (Signed)
Patient stopped by to speak with you today.  States that he is out of his [employer] supplies and you have been helping him get them at no cost. I told him I would send you a message and you would contact him.  He said it was okay to leave him a message if he didn't answer.

## 2021-01-14 NOTE — Telephone Encounter (Signed)
Edward Schneider now sees endocrinology and unfortunately I have no way of obtaining [name] supplies at no charge. I will call him tomorrow when I'm back in the office.

## 2021-01-15 ENCOUNTER — Telehealth: Payer: Self-pay | Admitting: Pharmacist

## 2021-01-15 ENCOUNTER — Telehealth: Payer: Self-pay | Admitting: Internal Medicine

## 2021-01-15 NOTE — Telephone Encounter (Signed)
Pt called because he states he has no way to check his blood sugars. He states his new transmitter is not compatible with his current monitor.   Freestyle libre 2 meter and sensors (30 day supply)  [place], [place] Phone:  [phone]  Fax:  [phone]

## 2021-01-15 NOTE — Telephone Encounter (Addendum)
  Chronic Care Management   Outreach Note  01/15/2021 Name: Edward Schneider. MRN: [id number] DOB: 1956-09-06  Referred by: [name], NP Reason for referral : Chronic Care Management   An unsuccessful telephone outreach was attempted today. Left voicemail to return my call at his convenience.   Follow Up Plan: Will ask CPA to outreach next month and schedule follow-up visit with me.  Junita Push. Kenton Kingfisher PharmD, [facility] [phone]   01/15/21 2:42 PM. I spoke with [name] regarding his CGM sensors. Per his report he is  Now using the [employer] 2 and this is costing him $300/month to fill at [facility] which he can not afford. He has a new insurance plan effective December 17, 2020. Patient informed there is not a patient assistance program for these supplies. Patient advised to call his new insurance plan and  Dr. Quin Hoop office and request this be ordered via Medicare order form and submitted to a DME supplier.  Patient to call me if further assistance needed.  Junita Push. Kenton Kingfisher PharmD, [facility] [phone]

## 2021-01-16 MED ORDER — FREESTYLE LIBRE 2 READER DEVI: 0 refills | Status: DC

## 2021-01-16 MED ORDER — FREESTYLE LIBRE 2 SENSOR MISC: 2 refills | Status: DC

## 2021-01-16 NOTE — Telephone Encounter (Signed)
Refill sent as requested. 

## 2021-01-23 ENCOUNTER — Telehealth: Payer: Self-pay

## 2021-01-23 ENCOUNTER — Other Ambulatory Visit: Payer: Self-pay | Admitting: Nurse Practitioner

## 2021-01-23 NOTE — Chronic Care Management (AMB) (Signed)
Chronic Care Management Pharmacy Assistant   Name: Edward Schneider.  MRN: [id number] DOB: 1956/04/09  Reason for Encounter: Disease State-DM    Recent office visits:  none  Recent consult visits:  none  Hospital visits:  None in previous 6 months  Medications: Outpatient Encounter Medications as of 01/23/2021  Medication Sig Note  . acetaminophen (TYLENOL) 650 MG CR tablet Take 1,300 mg by mouth every 8 (eight) hours as needed for pain.   Marland Kitchen albuterol (VENTOLIN HFA) 108 (90 Base) MCG/ACT inhaler Inhale 2 puffs into the lungs every 6 (six) hours as needed for wheezing or shortness of breath.   . Ascorbic Acid (VITAMIN C) 1000 MG tablet Take 1,000 mg by mouth daily.    Marland Kitchen aspirin EC 81 MG EC tablet Take 1 tablet (81 mg total) by mouth daily.   Marland Kitchen atorvastatin (LIPITOR) 80 MG tablet Take 1 tablet (80 mg total) by mouth daily. (Patient taking differently: Take 80 mg by mouth at bedtime.)   . benazepril (LOTENSIN) 20 MG tablet Take 1 tablet (20 mg total) by mouth daily.   . calcium carbonate (OS-CAL) 600 MG TABS tablet Take 600 mg by mouth daily.    . cetirizine (ZYRTEC) 10 MG tablet Take 10 mg by mouth daily.   . Cholecalciferol (VITAMIN D3) 25 MCG (1000 UT) CHEW Chew 1,000 Units by mouth daily.    Marland Kitchen CINNAMON PO Take 1,000 mg by mouth 2 (two) times daily.    . clobetasol cream (TEMOVATE) 9.37 % Apply 1 application topically 2 (two) times daily.   . Continuous Blood Gluc Receiver (FREESTYLE LIBRE 2 READER) DEVI Use as instructed to check blood sugar daily   . Continuous Blood Gluc Sensor (FREESTYLE LIBRE 2 SENSOR) MISC Use as instructed to check blood sugar daily   . cyclobenzaprine (FLEXERIL) 10 MG tablet Take 1 tablet (10 mg total) by mouth 3 (three) times daily as needed for muscle spasms. (Patient taking differently: Take 10 mg by mouth in the morning, at noon, and at bedtime.)   . dapagliflozin propanediol (FARXIGA) 5 MG TABS tablet Take 1 tablet (5 mg total) by mouth daily.   .  diclofenac Sodium (VOLTAREN) 1 % GEL Apply 2 g topically 4 (four) times daily as needed (pain).    Marland Kitchen diphenhydrAMINE (BENADRYL) 25 mg capsule Take 25 mg by mouth 2 (two) times daily.    Marland Kitchen doxepin (SINEQUAN) 25 MG capsule Take 50 mg by mouth at bedtime.    . DULoxetine (CYMBALTA) 30 MG capsule Take 30 mg by mouth 2 (two) times daily.    . DULoxetine (CYMBALTA) 60 MG capsule Take 1 capsule (60 mg total) by mouth at bedtime.   Marland Kitchen EPINEPHrine 0.3 mg/0.3 mL IJ SOAJ injection Inject 0.3 mLs (0.3 mg total) into the muscle as needed for anaphylaxis.   Marland Kitchen ezetimibe (ZETIA) 10 MG tablet Take 1 tablet (10 mg total) by mouth daily. 09/20/2020: Hasnt started  . Ginger, Zingiber officinalis, (GINGER PO) Take 1 Dose by mouth daily.   . Ginseng 100 MG CAPS Take 100 mg by mouth daily.    . insulin degludec (TRESIBA FLEXTOUCH) 100 UNIT/ML FlexTouch Pen Inject 28 Units into the skin daily. (Patient taking differently: Inject 30 Units into the skin at bedtime.)   . isosorbide mononitrate (IMDUR) 60 MG 24 hr tablet Take 1 tablet (60 mg total) by mouth daily.   . Multiple Vitamins-Minerals (HAIR SKIN AND NAILS FORMULA PO) Take 1 capsule by mouth daily.    Marland Kitchen  mupirocin ointment (BACTROBAN) 2 % Apply 1 application topically daily.    . naproxen sodium (ALEVE) 220 MG tablet Take 440 mg by mouth 2 (two) times daily as needed (migraines).   Karma Greaser 4 MG/0.1ML LIQD nasal spray kit Place 0.4 mg into the nose as needed (opioid overdose).    . nitroGLYCERIN (NITROSTAT) 0.4 MG SL tablet Place 0.4 mg under the tongue every 5 (five) minutes as needed for chest pain.    . Omega-3 1000 MG CAPS Take 1,000 mg by mouth daily.    Marland Kitchen oxyCODONE (ROXICODONE) 15 MG immediate release tablet TAKE 1/2 TO 1 (ONE HALF TO ONE) TABLET BY MOUTH FOUR TO SIX TIMES DAILY IF TOLERATED NOTE TABLET IS 15 MG (Patient taking differently: Take 15 mg by mouth every 4 (four) hours.)   . pantoprazole (PROTONIX) 40 MG tablet Take 1 tablet by mouth twice daily   .  pregabalin (LYRICA) 50 MG capsule TAKE 1 CAPSULE BY MOUTH THREE TIMES DAILY (Patient taking differently: Take 50 mg by mouth 3 (three) times daily.)   . Semaglutide,0.25 or 0.5MG/DOS, (OZEMPIC, 0.25 OR 0.5 MG/DOSE,) 2 MG/1.5ML SOPN Inject 0.5 mg into the skin every Friday.    . sucralfate (CARAFATE) 1 g tablet TAKE 1 TABLET BY MOUTH 4 TIMES DAILY WITH MEALS AND AT BEDTIME   . Tiotropium Bromide-Olodaterol (STIOLTO RESPIMAT) 2.5-2.5 MCG/ACT AERS Inhale 2 puffs into the lungs daily as needed (shortness of breath).   . traZODone (DESYREL) 100 MG tablet Take 1 tablet (100 mg total) by mouth at bedtime as needed for sleep. (Patient taking differently: Take 100 mg by mouth at bedtime.)   . triamcinolone cream (KENALOG) 0.1 % Apply 1 application topically 2 (two) times daily.   . vitamin A 10000 UNIT capsule Take 10,000 Units by mouth daily.    . vitamin B-12 (CYANOCOBALAMIN) 1000 MCG tablet Take 1,000 mcg by mouth daily.    . Vitamin E 400 units TABS Take 400 Units by mouth daily.     No facility-administered encounter medications on file as of 01/23/2021.    Recent Relevant Labs: Lab Results  Component Value Date/Time   HGBA1C 9.7 (H) 12/10/2020 10:14 AM   HGBA1C 8.9 (H) 09/02/2020 02:40 PM   MICROALBUR 80 (H) 05/23/2020 08:34 AM   MICROALBUR 80 (H) 04/27/2018 03:48 PM    Kidney Function Lab Results  Component Value Date/Time   CREATININE 1.19 12/06/2020 03:54 PM   CREATININE 1.06 09/02/2020 02:42 PM   CREATININE 0.85 02/28/2014 06:45 PM   CREATININE 0.94 10/13/2013 11:42 AM   GFR 66.47 01/24/2019 02:39 PM   GFRNONAA 65 12/06/2020 03:54 PM   GFRNONAA >60 02/28/2014 06:45 PM   GFRAA 75 12/06/2020 03:54 PM   GFRAA >60 02/28/2014 06:45 PM    . Current antihyperglycemic regimen:  o Farxiga 5 mg take 1 tablet daily o Tresiba 100 unit/ml inject 28 units daily o Ozempic 0.5 mg/dose inject 0.5 mg once a week  . What recent interventions/DTPs have been made to improve glycemic control:   o None  . Have there been any recent hospitalizations or ED visits since last visit with CPP? No   . Patient denies hypoglycemic symptoms, including Pale, Sweaty, Shaky, Hungry, Nervous/irritable and Vision changes   . Patient denies hyperglycemic symptoms, including blurry vision, excessive thirst, fatigue, polyuria and weakness   . How often are you checking your blood sugar? Patient is unable to check blood sugar  . What are your blood sugars ranging?  o Fasting: na  o Before meals: na o After meals: na o Bedtime: na  . During the week, how often does your blood glucose drop below 70? Patient is unable to check blood sugar   . Are you checking your feet daily/regularly? Patient states he checks his feet everyday.  Adherence Review: Is the patient currently on a STATIN medication? Yes Is the patient currently on ACE/ARB medication? Yes Does the patient have >5 day gap between last estimated fill dates? No   Georga Bora [place], [place]   Star Rating Drugs: Atorvastatin 80 mg last filled 10/31/20 90DS  Benazepril 20 mg last filled 11/23/20 90DS Farxiga 5 mg Ozempic 0.5 mg   Patient is scheduled for appointment on 01/27/21 at 3:30PM. Patient states he is not able to afford his [name] supply therefore he has not been able to check his blood sugars.    Lizbeth Bark Clinical Pharmacist Assistant [phone]

## 2021-01-27 ENCOUNTER — Telehealth: Payer: Self-pay

## 2021-01-27 NOTE — Telephone Encounter (Signed)
Pt called in upset that he has not had a call yet. States that he was suppose to have a call today at 3:30. Nothing is on his visit however there is a note from 4/7 in chart that says that he was to get a call from the ccm team. Tried to explain this to the pt he started yelling wouldn't let me talk and hung up the phone.

## 2021-02-04 ENCOUNTER — Telehealth: Payer: Self-pay | Admitting: Internal Medicine

## 2021-02-04 ENCOUNTER — Telehealth: Payer: Self-pay | Admitting: Pharmacist

## 2021-02-04 NOTE — Telephone Encounter (Signed)
Received voicemail from patient requesting call back. Left message with my direct contact information.

## 2021-02-04 NOTE — Telephone Encounter (Addendum)
Message left for patient to return my call.  Patient's insulins are from [facility], patient will need to contact them regarding refills because they ship directly to patient's home.  They handle his Humalog 200 units/mL, Basaglar 100 units/mL and Trulicity 1.5 mg/mL [facility] # [phone]  Patient will need to contact [facility] at [phone] because we already completed the updated form back in November.  Patient will need to contact for updates on his insulins and supplies.

## 2021-02-04 NOTE — Telephone Encounter (Signed)
Pt needs a PA  for his diabetes supplies so they will be able to ship his supplies.    As well as refill for; insulin aspart (novoLOG) injection 0-9 Units  Pt states that insulin and diabetes supplies come from the manufacture.

## 2021-02-05 ENCOUNTER — Telehealth: Payer: Self-pay

## 2021-02-05 NOTE — Telephone Encounter (Signed)
F/u Edward Schneider is calling regarding previous message Pt is out of medication and [facility] needs to be sent another prescription In for patient  Pt needs free sample to get him through a day or two. Edward Schneider is needing clarification on what medication he takes.

## 2021-02-05 NOTE — Chronic Care Management (AMB) (Signed)
Chronic Care Management Pharmacy Assistant   Name: Edward Schneider.  MRN: [id number] DOB: [birth year]  Reason for Encounter: Patient Assistance Documentation Call   Medications: Outpatient Encounter Medications as of 02/05/2021  Medication Sig Note  . acetaminophen (TYLENOL) 650 MG CR tablet Take 1,300 mg by mouth every 8 (eight) hours as needed for pain.   Marland Kitchen albuterol (VENTOLIN HFA) 108 (90 Base) MCG/ACT inhaler Inhale 2 puffs into the lungs every 6 (six) hours as needed for wheezing or shortness of breath.   . Ascorbic Acid (VITAMIN C) 1000 MG tablet Take 1,000 mg by mouth daily.    Marland Kitchen aspirin EC 81 MG EC tablet Take 1 tablet (81 mg total) by mouth daily.   Marland Kitchen atorvastatin (LIPITOR) 80 MG tablet Take 1 tablet (80 mg total) by mouth daily. (Patient taking differently: Take 80 mg by mouth at bedtime.)   . benazepril (LOTENSIN) 20 MG tablet Take 1 tablet (20 mg total) by mouth daily.   . calcium carbonate (OS-CAL) 600 MG TABS tablet Take 600 mg by mouth daily.    . cetirizine (ZYRTEC) 10 MG tablet Take 10 mg by mouth daily.   . Cholecalciferol (VITAMIN D3) 25 MCG (1000 UT) CHEW Chew 1,000 Units by mouth daily.    Marland Kitchen CINNAMON PO Take 1,000 mg by mouth 2 (two) times daily.    . clobetasol cream (TEMOVATE) 8.45 % Apply 1 application topically 2 (two) times daily.   . Continuous Blood Gluc Receiver (FREESTYLE LIBRE 2 READER) DEVI Use as instructed to check blood sugar daily   . Continuous Blood Gluc Sensor (FREESTYLE LIBRE 2 SENSOR) MISC Use as instructed to check blood sugar daily   . cyclobenzaprine (FLEXERIL) 10 MG tablet Take 1 tablet (10 mg total) by mouth 3 (three) times daily as needed for muscle spasms. (Patient taking differently: Take 10 mg by mouth in the morning, at noon, and at bedtime.)   . dapagliflozin propanediol (FARXIGA) 5 MG TABS tablet Take 1 tablet (5 mg total) by mouth daily.   . diclofenac Sodium (VOLTAREN) 1 % GEL Apply 2 g topically 4 (four) times daily as needed (pain).     Marland Kitchen diphenhydrAMINE (BENADRYL) 25 mg capsule Take 25 mg by mouth 2 (two) times daily.    Marland Kitchen doxepin (SINEQUAN) 25 MG capsule Take 50 mg by mouth at bedtime.    . Dulaglutide (TRULICITY) 1.5 XM/4.6OE SOPN Inject 1.5 mg into the skin once a week.   . DULoxetine (CYMBALTA) 30 MG capsule Take 30 mg by mouth 2 (two) times daily.    . DULoxetine (CYMBALTA) 60 MG capsule Take 1 capsule (60 mg total) by mouth at bedtime.   Marland Kitchen EPINEPHrine 0.3 mg/0.3 mL IJ SOAJ injection Inject 0.3 mLs (0.3 mg total) into the muscle as needed for anaphylaxis.   Marland Kitchen ezetimibe (ZETIA) 10 MG tablet Take 1 tablet (10 mg total) by mouth daily. 09/20/2020: Hasnt started  . Ginger, Zingiber officinalis, (GINGER PO) Take 1 Dose by mouth daily.   . Ginseng 100 MG CAPS Take 100 mg by mouth daily.    . insulin degludec (TRESIBA FLEXTOUCH) 100 UNIT/ML FlexTouch Pen Inject 28 Units into the skin daily. (Patient taking differently: Inject 30 Units into the skin at bedtime.)   . Insulin Glargine (BASAGLAR KWIKPEN) 100 UNIT/ML Inject 30 Units into the skin daily.   . insulin lispro (HUMALOG KWIKPEN) 200 UNIT/ML KwikPen Inject into the skin. Injecting 20 units with Breakfast, 16 units with lunch and supper   .  isosorbide mononitrate (IMDUR) 60 MG 24 hr tablet Take 1 tablet (60 mg total) by mouth daily.   . Multiple Vitamins-Minerals (HAIR SKIN AND NAILS FORMULA PO) Take 1 capsule by mouth daily.    . mupirocin ointment (BACTROBAN) 2 % Apply 1 application topically daily.    . naproxen sodium (ALEVE) 220 MG tablet Take 440 mg by mouth 2 (two) times daily as needed (migraines).   Karma Greaser 4 MG/0.1ML LIQD nasal spray kit Place 0.4 mg into the nose as needed (opioid overdose).    . nitroGLYCERIN (NITROSTAT) 0.4 MG SL tablet Place 0.4 mg under the tongue every 5 (five) minutes as needed for chest pain.    . Omega-3 1000 MG CAPS Take 1,000 mg by mouth daily.    Marland Kitchen oxyCODONE (ROXICODONE) 15 MG immediate release tablet TAKE 1/2 TO 1 (ONE HALF TO ONE)  TABLET BY MOUTH FOUR TO SIX TIMES DAILY IF TOLERATED NOTE TABLET IS 15 MG (Patient taking differently: Take 15 mg by mouth every 4 (four) hours.)   . pantoprazole (PROTONIX) 40 MG tablet Take 1 tablet by mouth twice daily   . pregabalin (LYRICA) 50 MG capsule TAKE 1 CAPSULE BY MOUTH THREE TIMES DAILY (Patient taking differently: Take 50 mg by mouth 3 (three) times daily.)   . sucralfate (CARAFATE) 1 g tablet TAKE 1 TABLET BY MOUTH 4 TIMES DAILY WITH MEALS AND AT BEDTIME   . Tiotropium Bromide-Olodaterol (STIOLTO RESPIMAT) 2.5-2.5 MCG/ACT AERS Inhale 2 puffs into the lungs daily as needed (shortness of breath).   . traZODone (DESYREL) 100 MG tablet Take 1 tablet (100 mg total) by mouth at bedtime as needed for sleep. (Patient taking differently: Take 100 mg by mouth at bedtime.)   . triamcinolone cream (KENALOG) 0.1 % Apply 1 application topically 2 (two) times daily.   . vitamin A 10000 UNIT capsule Take 10,000 Units by mouth daily.    . vitamin B-12 (CYANOCOBALAMIN) 1000 MCG tablet Take 1,000 mcg by mouth daily.    . Vitamin E 400 units TABS Take 400 Units by mouth daily.     No facility-administered encounter medications on file as of 02/05/2021.   Patient called and informed me that he is   waiting for the delivery of humalog from patient assistance. I called [employer] and was informed that Edward Schneider received a 180 DS supply of Humalog on 02/09 and is not due for a new delivery until 05/17. He will be receiving  A delivery for Trulicity on 15/52.  Called and spoke with Vallarie Mare, nurse at endocrinology office. She will see if their office has samples of humalog/ or see if another prescription can be faxed to the pharmacy. She did discuss that it is unusual that he is out of humalog this quickly. We agreed to confirm usage when patient was next contacted. Patient returned my call. He reported that he had increased his humalog to 20 units at breakfast and 18 units for lunch and dinner. I gave the  directions for Humalog 200 unit/ ml , Inject into the skin. Injecting 20 units with Breakfast, 16 units with lunch and supper. Edward Schneider informed me that he was not aware that this was the correct dosage and stated that he would adjust the pen accordingly. He confirmed that he does have 4 needles remaining that would last until his next delivery from [employer].   Wilford Sports CPA, CMA

## 2021-02-05 NOTE — Telephone Encounter (Signed)
Spoken to Caryl Pina that we had switch patient patient assistance from [employer] to [employer] because patient will have their Rx auto ship to patient's home.  We have switch to Humalog, Basaglar, and Trulicity.  We have limited sample in the office and currently very low.  Suggested we can sent Rx to pharmacy for patient.

## 2021-02-19 ENCOUNTER — Other Ambulatory Visit: Payer: Self-pay | Admitting: Internal Medicine

## 2021-02-19 ENCOUNTER — Other Ambulatory Visit: Payer: Self-pay | Admitting: Nurse Practitioner

## 2021-02-19 NOTE — Telephone Encounter (Signed)
Requested Prescriptions  Pending Prescriptions Disp Refills  . sucralfate (CARAFATE) 1 g tablet [Pharmacy Med Name: Sucralfate 1 GM Oral Tablet] 360 tablet 0    Sig: TAKE 1 TABLET BY MOUTH 4 TIMES DAILY WITH MEALS AND AT BEDTIME     Gastroenterology: Antiacids Passed - 02/19/2021  2:19 PM      Passed - Valid encounter within last 12 months    Recent Outpatient Visits          2 months ago Uncontrolled type 2 diabetes mellitus with hyperglycemia ([place])   [facility], Erda T, NP   3 months ago Lab test positive for detection of COVID-19 virus   [facility] [place], Santiago Glad, NP   5 months ago Uncontrolled type 2 diabetes mellitus with hyperglycemia ([place])   [place], Jolene T, NP   7 months ago Uncontrolled type 2 diabetes mellitus with hyperglycemia ([place])   [place], NP   9 months ago Routine general medical examination at a health care facility   [facility], Barbaraann Faster, NP      Future Appointments            In 2 weeks Cannady, Barbaraann Faster, NP [employer], [place]   In 1 month End, Harrell Gave, MD [facility], [place]

## 2021-02-20 ENCOUNTER — Telehealth: Payer: Self-pay | Admitting: *Deleted

## 2021-02-20 NOTE — Telephone Encounter (Signed)
Signed and placed in holder

## 2021-02-20 NOTE — Telephone Encounter (Signed)
Patient notified and verbalized understanding. 

## 2021-02-20 NOTE — Telephone Encounter (Signed)
Pt. brought in his DMV form to be filled out by Dr. Georgena Spurling in [facility] for review

## 2021-02-21 ENCOUNTER — Telehealth: Payer: Self-pay | Admitting: Internal Medicine

## 2021-02-21 MED ORDER — ATORVASTATIN CALCIUM 80 MG PO TABS: 80.0000 mg | ORAL_TABLET | Freq: Every day | ORAL | 0 refills | Status: DC

## 2021-02-21 NOTE — Telephone Encounter (Signed)
Requested Prescriptions   Signed Prescriptions Disp Refills   atorvastatin (LIPITOR) 80 MG tablet 90 tablet 0    Sig: Take 1 tablet (80 mg total) by mouth at bedtime.    Authorizing Provider: END, CHRISTOPHER    Ordering User: Raelene Bott, Nastassja Witkop L

## 2021-02-21 NOTE — Telephone Encounter (Signed)
*  STAT* If patient is at the pharmacy, call can be transferred to refill team.   1. Which medications need to be refilled? (please list name of each medication and dose if known) Atorvastatin  2. Which pharmacy/location (including street and city if local pharmacy) is medication to be sent to?[facility], [address]  3. Do they need a 30 day or 90 day supply? 90  Patient is out of medication schedule in June with Dr. Saunders Revel

## 2021-02-27 ENCOUNTER — Other Ambulatory Visit: Payer: Self-pay

## 2021-02-27 ENCOUNTER — Ambulatory Visit: Payer: Medicare Other | Admitting: Internal Medicine

## 2021-02-27 VITALS — BP 126/70 | HR 90 | Ht 68.0 in | Wt 245.0 lb

## 2021-02-27 DIAGNOSIS — E1165 Type 2 diabetes mellitus with hyperglycemia: Secondary | ICD-10-CM | POA: Diagnosis not present

## 2021-02-27 DIAGNOSIS — E1159 Type 2 diabetes mellitus with other circulatory complications: Secondary | ICD-10-CM

## 2021-02-27 DIAGNOSIS — E1142 Type 2 diabetes mellitus with diabetic polyneuropathy: Secondary | ICD-10-CM

## 2021-02-27 LAB — POCT GLYCOSYLATED HEMOGLOBIN (HGB A1C): Hemoglobin A1C: 8.8 % — AB (ref 4.0–5.6)

## 2021-02-27 NOTE — Patient Instructions (Addendum)
-   Continue Basaglar  30 units daily  - Increase Humalog 22 units with Breskfast and continue 16 units with Lunch and Supper  - Continue Trulicity 1.5  mg weekly - Farxiga 5 mg , 1 tablet with Breakfast      HOW TO TREAT LOW BLOOD SUGARS (Blood sugar LESS THAN 70 MG/DL)  Please follow the RULE OF 15 for the treatment of hypoglycemia treatment (when your (blood sugars are less than 70 mg/dL)    STEP 1: Take 15 grams of carbohydrates when your blood sugar is low, which includes:   3-4 GLUCOSE TABS  OR  3-4 OZ OF JUICE OR REGULAR SODA OR  ONE TUBE OF GLUCOSE GEL     STEP 2: RECHECK blood sugar in 15 MINUTES STEP 3: If your blood sugar is still low at the 15 minute recheck --> then, go back to STEP 1 and treat AGAIN with another 15 grams of carbohydrates.
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
## 2021-03-04 LAB — HM DIABETES EYE EXAM

## 2021-03-05 ENCOUNTER — Encounter: Payer: Self-pay | Admitting: Nurse Practitioner

## 2021-03-05 ENCOUNTER — Ambulatory Visit (INDEPENDENT_AMBULATORY_CARE_PROVIDER_SITE_OTHER): Payer: Medicare Other | Admitting: Nurse Practitioner

## 2021-03-05 ENCOUNTER — Other Ambulatory Visit: Payer: Self-pay

## 2021-03-05 VITALS — BP 107/70 | HR 82 | Temp 98.4°F | Wt 240.8 lb

## 2021-03-05 DIAGNOSIS — I700 Atherosclerosis of aorta: Secondary | ICD-10-CM

## 2021-03-05 DIAGNOSIS — E1165 Type 2 diabetes mellitus with hyperglycemia: Secondary | ICD-10-CM

## 2021-03-05 DIAGNOSIS — E1142 Type 2 diabetes mellitus with diabetic polyneuropathy: Secondary | ICD-10-CM

## 2021-03-05 DIAGNOSIS — M5442 Lumbago with sciatica, left side: Secondary | ICD-10-CM

## 2021-03-05 DIAGNOSIS — E1159 Type 2 diabetes mellitus with other circulatory complications: Secondary | ICD-10-CM

## 2021-03-05 DIAGNOSIS — I152 Hypertension secondary to endocrine disorders: Secondary | ICD-10-CM

## 2021-03-05 DIAGNOSIS — E1169 Type 2 diabetes mellitus with other specified complication: Secondary | ICD-10-CM | POA: Diagnosis not present

## 2021-03-05 DIAGNOSIS — G8929 Other chronic pain: Secondary | ICD-10-CM

## 2021-03-05 DIAGNOSIS — J432 Centrilobular emphysema: Secondary | ICD-10-CM

## 2021-03-05 DIAGNOSIS — G4733 Obstructive sleep apnea (adult) (pediatric): Secondary | ICD-10-CM

## 2021-03-05 DIAGNOSIS — M5441 Lumbago with sciatica, right side: Secondary | ICD-10-CM

## 2021-03-05 DIAGNOSIS — G43009 Migraine without aura, not intractable, without status migrainosus: Secondary | ICD-10-CM

## 2021-03-05 DIAGNOSIS — D692 Other nonthrombocytopenic purpura: Secondary | ICD-10-CM

## 2021-03-05 DIAGNOSIS — F321 Major depressive disorder, single episode, moderate: Secondary | ICD-10-CM

## 2021-03-05 DIAGNOSIS — E785 Hyperlipidemia, unspecified: Secondary | ICD-10-CM

## 2021-03-05 LAB — MICROALBUMIN, URINE WAIVED
Creatinine, Urine Waived: 300 mg/dL (ref 10–300)
Microalb, Ur Waived: 30 mg/L — ABNORMAL HIGH (ref 0–19)
Microalb/Creat Ratio: <30 mg/g (ref <30)

## 2021-03-05 MED ORDER — NURTEC 75 MG PO TBDP: ORAL_TABLET | ORAL | 2 refills | Status: DC

## 2021-03-05 NOTE — Assessment & Plan Note (Signed)
As evidenced by bruising bilateral upper extremities and ASA use.  Recommend gentle skin care at home and monitor for skin breakdown, if presents immediately notify provider.

## 2021-03-05 NOTE — Assessment & Plan Note (Signed)
Chronic, ongoing.  Continue current medication regimen and adjust as needed. Lipid panel today. 

## 2021-03-05 NOTE — Assessment & Plan Note (Signed)
Chronic, ongoing, followed by pain management (Dr. Primus Bravo).  Continue current medication regimen as prescribed by this provider and Duloxetine for mood and pain.

## 2021-03-05 NOTE — Assessment & Plan Note (Signed)
Chronic, continue 100% use of CPAP at home.

## 2021-03-05 NOTE — Assessment & Plan Note (Signed)
Chronic, with recent return of migraines.  Already on multiple medications which could benefit these. Will avoid Imitrex or family members due to heart history.  Reviewed and will trial Nurtec as needed, discussed with patient.  Avoid Ibuprofen due to kidney health.  Return for worsening or ongoing.

## 2021-03-05 NOTE — Assessment & Plan Note (Signed)
Chronic, ongoing will continue to collaborate with endo.  A1C 8.8% at recent endo visit.  Continue Duloxetine and collaboration with pain management + collaboration with endocrinology and current medication regimen. + collaboration with CCM team.  Recommend continue to monitor BS QID with [name] and focus heavily on diet and modest weight loss.  Bring Libre to visits.  Return in 3 months.

## 2021-03-05 NOTE — Patient Instructions (Signed)

## 2021-03-05 NOTE — Assessment & Plan Note (Signed)
Chronic, ongoing.  Continue current inhaler regimen, Stiolto through assistance program received and Albuterol - Julie Harris PharmD on this.  Continue collaboration with pulmonary.  Recommend focus on modest weight loss.  Return in 3 months. 

## 2021-03-05 NOTE — Assessment & Plan Note (Signed)
Noted on CT scan in 2016, continue daily statin and ASA for prevention.  Recommend modest weight loss.

## 2021-03-05 NOTE — Assessment & Plan Note (Signed)
Chronic, ongoing secondary to chronic diseases and pain.  Continue Duloxetine, which benefits mood and pain, plus Trazodone for sleep.  Refuses psychiatry or therapy referral.  Will continue current regimen and adjust as needed.  If SI presents he is to immediately go to ER, which he agrees with.  Return to office in 3 months, sooner if worsening mood.

## 2021-03-05 NOTE — Assessment & Plan Note (Signed)
Chronic, ongoing will continue to collaborate with endo.  A1C 8.8%, with endo recent visit.  Urine ALB 30 today, continue Benazepril for kidney protection.  Continue collaboration with endocrinology and current medication regimen.  Continue collaboration with CCM team.  Recommend continue to monitor BS QID with [name] and focus heavily on diet and modest weight loss.  Bring Libre to visits.  Return in 3 months.

## 2021-03-05 NOTE — Progress Notes (Signed)
BP 107/70   Pulse 82   Temp 98.4 F (36.9 C) (Oral)   Wt 240 lb 12.8 oz (109.2 kg)   SpO2 96%   BMI 36.61 kg/m    Subjective:    Patient ID: Edward Schneider., male    DOB: May 09, 1956, 65 y.o.   MRN: [id number]  HPI: Edward Schneider. is a 65 y.o. male  Chief Complaint  Patient presents with  . Diabetes  . Hyperlipidemia  . Hypertension  . COPD  . Mood  . Migraine    Patient states he is having issues with migraines. Patient states they came back with a vengeance back in February. Patient states he has had issues with them before when he was seeing Dr.Crissman and they went away and have come back.    DIABETES Sees endocrinology and last sawin May 2022 with A1C8.8%, Dr. Kelton Pillar.To continueinsulin Basaglar 30 units and Humalog increase to22 units with breakfast andthen continue 16 with lunch andsupper. Taking Trulicity 1.5 MG weekly and Farxiga 5 MG.  Has lost 20 pounds since last visit -- focused on diet.   Hypoglycemic episodes:no Polydipsia/polyuria:no Visual disturbance:no Chest pain:no Paresthesias:no Glucose Monitoring:yes Accucheck frequency: QID Fasting glucose:74 -- 170 Post prandial: Evening: Before meals: Taking Insulin?:yes Long acting insulin: Tresiba30units Short acting insulin: Novlog20 units with breakfast andthen 16 with lunch andsupper Blood Pressure Monitoring:daily Retinal Examination:Up to Date -- yesterday at [place] to Date Pneumovax:Up to Date Influenza:Up to Date Aspirin:yes  HYPERTENSION / HYPERLIPIDEMIA Followed by cardiology and last saw12/3/21 -- taken off Plavix. Last echo February 2021 = EF 60-65% with normal left ventricular diastolic parameters.Continues Atorvastatin 80 MG.  Takes Benazepril 20 MG, Doxepin 25 MG for HTN.   Satisfied with current treatment?yes Duration of  hypertension:chronic BP monitoring frequency:daily BP range:120/80 on average BP medication side effects:no Duration of hyperlipidemia:chronic Cholesterol medication side effects:no Cholesterol supplements: none Medication compliance:good compliance Aspirin:yes Recent stressors:no Recurrent headaches:no Visual changes:no Palpitations:no Dyspnea: at baseline, no worsening Chest pain: none recent Lower extremity edema:no Dizzy/lightheaded:no The ASCVD Risk score [name], et al., 2013) failed to calculate for the following reasons:   The patient has a prior MI or stroke diagnosis  COPD Continues Stiolto and Albuterol -- he is out of these, gets them via assistance. Does use CPAP and uses 100% of the time.Smoked for 48 years, was 6-7 when started. Smoked 3 cartons to a carton and a half a week. Quit in 2012. Goes for annual lung screening, last went July 2021. Last saw pulmonary on 12/24/20. COPD status:stable Satisfied with current treatment?:yes Oxygen use:no Dyspnea frequency:at baseline Cough frequency:minimal Rescue inhaler frequency:using three times a day Limitation of activity:no Productive cough:none Last Spirometry:with pulmonary Pneumovax:Up to Date Influenza:Up to Date  CHRONICPAIN Pain medicine provider, Dr. Primus Bravo and last saw one week ago. Takes Oxycodone as needed.  Currently being followed by ortho for and is working on weight and A1c to get hip surgery both sides and both knees and both shoulders.  A1c needs to be 8 or lower for surgery. Duration:chronic Mechanism of injury:unknown Location:low back Onset:gradual Severity:10/10 Quality:sharp and aching Frequency:intermittent Radiation:R leg below the knee and L leg below the knee Aggravating factors:lifting, movement, walking and bending Alleviating factors: nothing Status:worse Treatments attempted:Oxycodone Relief with NSAIDs?:No NSAIDs Taken Nighttime  pain:at times Paresthesias / decreased sensation:yes Bowel / bladder incontinence:no Fevers:no Dysuria / urinary frequency:no  MIGRAINES Took medication in past for this, as needed medication.  Does not recall what. Duration:  chronic Onset: gradual Severity: moderate Quality: dull and aching Frequency: intermittent Location: in between eyeballs Headache duration: 30 minutes to 24 hours Radiation: no Time of day headache occurs: varies Alleviating factors: nothing Aggravating factors: unknown Headache status at time of visit: asymptomatic Treatments attempted: Treatments attempted: rest and APAP   Aura: no Nausea:  yes Vomiting: yes Photophobia:  yes Phonophobia:  yes Effect on social functioning:  yes Numbers of missed days of school/work each month: none Confusion:  no Gait disturbance/ataxia:  no Behavioral changes:  no Fevers:  no  DEPRESSION Continues on Duloxetine 90 MG daily and Trazodone.  Mood status:stable Satisfied with current treatment?:yes Symptom severity:moderate Duration of current treatment :chronic Side effects:no Medication compliance:good compliance Psychotherapy/counseling:none Depressed mood:yes Anxious mood:no Anhedonia:no Significant weight loss or gain:no Insomnia:yeshard to fall asleep Fatigue:no Feelings of worthlessness or guilt:yes Impaired concentration/indecisiveness:yes Suicidal ideations:as above Hopelessness:no Crying spells:no Depression screen [facility] 2/9 12/06/2020 09/02/2020 05/23/2020 05/21/2020 03/06/2020  Decreased Interest 0 0 1 2 0  Down, Depressed, Hopeless 0 1 3 1 3   PHQ - 2 Score 0 1 4 3 3   Altered sleeping 0 3 1 3 3   Tired, decreased energy 3 3 3 3  0  Change in appetite 0 0 1 3 1   Feeling bad or failure about yourself  0 0 0 0 1  Trouble concentrating 0 0 0 0 0  Moving slowly or fidgety/restless 0 0 0 1 0  Suicidal thoughts 0 0 0 0 1  PHQ-9 Score 3 7 9 13 9   Difficult doing work/chores  - Not difficult at all Somewhat difficult Very difficult Somewhat difficult  Some recent data might be hidden   Relevant past medical, surgical, family and social history reviewed and updated as indicated. Interim medical history since our last visit reviewed. Allergies and medications reviewed and updated.  Review of Systems  Constitutional: Negative.   Respiratory: Negative.   Cardiovascular: Negative.   Gastrointestinal: Negative.   Neurological: Positive for headaches. Negative for dizziness, tremors, seizures, speech difficulty, weakness and numbness.    Per HPI unless specifically indicated above     Objective:    BP 107/70   Pulse 82   Temp 98.4 F (36.9 C) (Oral)   Wt 240 lb 12.8 oz (109.2 kg)   SpO2 96%   BMI 36.61 kg/m   Wt Readings from Last 3 Encounters:  03/05/21 240 lb 12.8 oz (109.2 kg)  02/27/21 245 lb (111.1 kg)  12/24/20 245 lb 14.4 oz (111.5 kg)    Physical Exam Vitals and nursing note reviewed.  Constitutional:      General: He is awake. He is not in acute distress.    Appearance: He is well-developed and well-groomed. He is obese. He is not ill-appearing or toxic-appearing.  HENT:     Head: Normocephalic and atraumatic.     Right Ear: Hearing normal. No drainage.     Left Ear: Hearing normal. No drainage.  Eyes:     General: Lids are normal.        Right eye: No discharge.        Left eye: No discharge.     Conjunctiva/sclera: Conjunctivae normal.     Pupils: Pupils are equal, round, and reactive to light.  Neck:     Thyroid: No thyromegaly.     Vascular: No carotid bruit.     Trachea: Trachea normal.  Cardiovascular:     Rate and Rhythm: Normal rate and regular rhythm.     Heart sounds: Normal heart  sounds, S1 normal and S2 normal. No murmur heard. No gallop.   Pulmonary:     Effort: Pulmonary effort is normal. No accessory muscle usage or respiratory distress.     Breath sounds: Normal breath sounds.  Abdominal:     General: Bowel  sounds are normal.     Palpations: Abdomen is soft.  Musculoskeletal:        General: Normal range of motion.     Cervical back: Normal range of motion and neck supple.     Right lower leg: No edema.     Left lower leg: No edema.  Skin:    General: Skin is warm and dry.     Capillary Refill: Capillary refill takes less than 2 seconds.     Findings: No rash.     Comments: Scattered pale bruising to upper extremities and abrasions -- baseline.  Neurological:     Mental Status: He is alert and oriented to person, place, and time.     Cranial Nerves: Cranial nerves are intact.     Motor: Motor function is intact.     Coordination: Coordination is intact.     Gait: Gait is intact.     Deep Tendon Reflexes: Reflexes are normal and symmetric.  Psychiatric:        Attention and Perception: Attention normal.        Mood and Affect: Mood normal.        Behavior: Behavior normal. Behavior is cooperative.        Thought Content: Thought content normal.    Results for orders placed or performed in visit on 02/27/21  POCT HgB A1C  Result Value Ref Range   Hemoglobin A1C 8.8 (A) 4.0 - 5.6 %   HbA1c POC (<> result, manual entry)     HbA1c, POC (prediabetic range)     HbA1c, POC (controlled diabetic range)        Assessment & Plan:   Problem List Items Addressed This Visit      Cardiovascular and Mediastinum   Migraine headache    Chronic, with recent return of migraines.  Already on multiple medications which could benefit these. Will avoid Imitrex or family members due to heart history.  Reviewed and will trial Nurtec as needed, discussed with patient.  Avoid Ibuprofen due to kidney health.  Return for worsening or ongoing.      Relevant Medications   Rimegepant Sulfate (NURTEC) 75 MG TBDP   Hypertension associated with diabetes ([place])    Chronic, stable with BP at goal.  Continue current medication regimen and adjust as needed + continue collaboration with cardiology team.  Recommend  he continue to monitor BP closely at home and document + bring levels to visit.  DASH diet at home.  CMP today.  Return in 3 months.      Atherosclerosis of abdominal aorta ([place])    Noted on CT scan in 2016, continue daily statin and ASA for prevention.  Recommend modest weight loss.      Senile purpura ([place])    As evidenced by bruising bilateral upper extremities and ASA use.  Recommend gentle skin care at home and monitor for skin breakdown, if presents immediately notify provider.        Respiratory   Emphysema lung (HCC)    Chronic, ongoing.  Continue current inhaler regimen, Stiolto through assistance program received and Albuterol - Birdena Crandall PharmD on this.  Continue collaboration with pulmonary.  Recommend focus on modest weight loss.  Return in 3 months.      Sleep apnea    Chronic, continue 100% use of CPAP at home.        Endocrine   Hyperlipidemia associated with type 2 diabetes mellitus (HCC)    Chronic, ongoing.  Continue current medication regimen and adjust as needed.  Lipid panel today.       Relevant Orders   Comprehensive metabolic panel   Lipid Panel w/o Chol/HDL Ratio   DM type 2 with diabetic peripheral neuropathy (HCC)    Chronic, ongoing will continue to collaborate with endo.  A1C 8.8% at recent endo visit.  Continue Duloxetine and collaboration with pain management + collaboration with endocrinology and current medication regimen. + collaboration with CCM team.  Recommend continue to monitor BS QID with [name] and focus heavily on diet and modest weight loss.  Bring Libre to visits.  Return in 3 months.      Uncontrolled type 2 diabetes mellitus with hyperglycemia (HCC) - Primary    Chronic, ongoing will continue to collaborate with endo.  A1C 8.8%, with endo recent visit.  Urine ALB 30 today, continue Benazepril for kidney protection.  Continue collaboration with endocrinology and current medication regimen.  Continue collaboration with CCM team.   Recommend continue to monitor BS QID with [name] and focus heavily on diet and modest weight loss.  Bring Libre to visits.  Return in 3 months.      Relevant Orders   Microalbumin, Urine Waived   Comprehensive metabolic panel     Other   Chronic back pain    Chronic, ongoing, followed by pain management (Dr. Primus Bravo).  Continue current medication regimen as prescribed by this provider and Duloxetine for mood and pain.       Morbid obesity (HCC)    BMI 36.61 with recent loss, praised for loss and heavy diet focus.  Recommended eating smaller high protein, low fat meals more frequently and exercising 30 mins a day 5 times a week with a goal of 10-15lb weight loss in the next 3 months. Patient voiced their understanding and motivation to adhere to these recommendations.       Depression, major, single episode, moderate (HCC)    Chronic, ongoing secondary to chronic diseases and pain.  Continue Duloxetine, which benefits mood and pain, plus Trazodone for sleep.  Refuses psychiatry or therapy referral.  Will continue current regimen and adjust as needed.  If SI presents he is to immediately go to ER, which he agrees with.  Return to office in 3 months, sooner if worsening mood.          Follow up plan: Return in about 3 months (around 06/05/2021) for T2DM, HTN/HLD, CHRONIC PAIN, COPD, MIGRAINES.

## 2021-03-05 NOTE — Assessment & Plan Note (Signed)
BMI 36.61 with recent loss, praised for loss and heavy diet focus.  Recommended eating smaller high protein, low fat meals more frequently and exercising 30 mins a day 5 times a week with a goal of 10-15lb weight loss in the next 3 months. Patient voiced their understanding and motivation to adhere to these recommendations.

## 2021-03-05 NOTE — Assessment & Plan Note (Signed)
Chronic, stable with BP at goal.  Continue current medication regimen and adjust as needed + continue collaboration with cardiology team.  Recommend he continue to monitor BP closely at home and document + bring levels to visit.  DASH diet at home.  CMP today.  Return in 3 months.

## 2021-03-06 ENCOUNTER — Ambulatory Visit (INDEPENDENT_AMBULATORY_CARE_PROVIDER_SITE_OTHER): Payer: Medicare Other | Admitting: Podiatry

## 2021-03-06 ENCOUNTER — Other Ambulatory Visit: Payer: Self-pay

## 2021-03-06 ENCOUNTER — Encounter: Payer: Self-pay | Admitting: Podiatry

## 2021-03-06 DIAGNOSIS — D689 Coagulation defect, unspecified: Secondary | ICD-10-CM

## 2021-03-06 DIAGNOSIS — M79674 Pain in right toe(s): Secondary | ICD-10-CM

## 2021-03-06 DIAGNOSIS — B351 Tinea unguium: Secondary | ICD-10-CM

## 2021-03-06 DIAGNOSIS — E1142 Type 2 diabetes mellitus with diabetic polyneuropathy: Secondary | ICD-10-CM | POA: Diagnosis not present

## 2021-03-06 DIAGNOSIS — M79675 Pain in left toe(s): Secondary | ICD-10-CM | POA: Diagnosis not present

## 2021-03-06 LAB — COMPREHENSIVE METABOLIC PANEL
ALT: 30 IU/L (ref 0–44)
AST: 34 IU/L (ref 0–40)
Albumin/Globulin Ratio: 1.6 (ref 1.2–2.2)
Albumin: 4.4 g/dL (ref 3.8–4.8)
Alkaline Phosphatase: 71 IU/L (ref 44–121)
BUN/Creatinine Ratio: 19 (ref 10–24)
BUN: 21 mg/dL (ref 8–27)
Bilirubin Total: 0.3 mg/dL (ref 0.0–1.2)
CO2: 17 mmol/L — ABNORMAL LOW (ref 20–29)
Calcium: 9.5 mg/dL (ref 8.6–10.2)
Chloride: 102 mmol/L (ref 96–106)
Creatinine, Ser: 1.12 mg/dL (ref 0.76–1.27)
Globulin, Total: 2.7 g/dL (ref 1.5–4.5)
Glucose: 181 mg/dL — ABNORMAL HIGH (ref 65–99)
Potassium: 4.5 mmol/L (ref 3.5–5.2)
Sodium: 138 mmol/L (ref 134–144)
Total Protein: 7.1 g/dL (ref 6.0–8.5)
eGFR: 74 mL/min/{1.73_m2} (ref >59)

## 2021-03-06 LAB — LIPID PANEL W/O CHOL/HDL RATIO
Cholesterol, Total: 121 mg/dL (ref 100–199)
HDL: 40 mg/dL (ref >39)
LDL Chol Calc (NIH): 35 mg/dL (ref 0–99)
Triglycerides: 309 mg/dL — ABNORMAL HIGH (ref 0–149)
VLDL Cholesterol Cal: 46 mg/dL — ABNORMAL HIGH (ref 5–40)

## 2021-03-06 NOTE — Progress Notes (Signed)
Good morning, please let Gerhard know his labs have returned.  Kidney and liver function remain stable.  Cholesterol levels show LDL below goal, great news, but triglycerides slightly elevated.  Continue all medications as ordered and we will recheck next visit.  Any questions? Keep being awesome!!  Thank you for allowing me to participate in your care.  I appreciate you. Kindest regards, Abigayle Wilinski

## 2021-03-06 NOTE — Progress Notes (Signed)
This patient returns to my office for at risk foot care.  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  This patient is unable to cut nails himself since the patient cannot reach his nails.These nails are painful walking and wearing shoes.  This patient presents for at risk foot care today.  General Appearance  Alert, conversant and in no acute stress.  Vascular  Dorsalis pedis and posterior tibial  pulses are palpable  bilaterally.  Capillary return is within normal limits  bilaterally. Temperature is within normal limits  bilaterally.  Neurologic  Senn-Weinstein monofilament wire test within normal limits  bilaterally. Muscle power within normal limits bilaterally.  Nails Thick disfigured discolored nails with subungual debris  from hallux to fifth toes bilaterally. No evidence of bacterial infection or drainage bilaterally.  Orthopedic  No limitations of motion  feet .  No crepitus or effusions noted.  No bony pathology or digital deformities noted.  Skin  normotropic skin with no porokeratosis noted bilaterally.  No signs of infections or ulcers noted.     Onychomycosis  Pain in right toes  Pain in left toes  Consent was obtained for treatment procedures.   Mechanical debridement of nails 1-5  bilaterally performed with a nail nipper.  Filed with dremel without incident.    Return office visit   10 weeks                   Told patient to return for periodic foot care and evaluation due to potential at risk complications.   Gardiner Barefoot DPM

## 2021-03-20 ENCOUNTER — Other Ambulatory Visit: Payer: Self-pay | Admitting: Internal Medicine

## 2021-03-26 ENCOUNTER — Ambulatory Visit: Payer: Medicare Other | Admitting: Internal Medicine

## 2021-03-26 ENCOUNTER — Other Ambulatory Visit: Payer: Self-pay

## 2021-03-26 ENCOUNTER — Encounter: Payer: Self-pay | Admitting: Internal Medicine

## 2021-03-26 VITALS — BP 94/64 | HR 93 | Ht 68.0 in | Wt 239.0 lb

## 2021-03-26 DIAGNOSIS — E1169 Type 2 diabetes mellitus with other specified complication: Secondary | ICD-10-CM | POA: Diagnosis not present

## 2021-03-26 DIAGNOSIS — I10 Essential (primary) hypertension: Secondary | ICD-10-CM

## 2021-03-26 DIAGNOSIS — E785 Hyperlipidemia, unspecified: Secondary | ICD-10-CM

## 2021-03-26 DIAGNOSIS — I25118 Atherosclerotic heart disease of native coronary artery with other forms of angina pectoris: Secondary | ICD-10-CM

## 2021-03-26 MED ORDER — BENAZEPRIL HCL 10 MG PO TABS: 10.0000 mg | ORAL_TABLET | Freq: Every day | ORAL | Status: DC
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
## 2021-03-26 NOTE — Patient Instructions (Signed)
Medication Instructions:  Your physician has recommended you make the following change in your medication:   REDUCE Benazepril to 10 mg daily.  *If you need a refill on your cardiac medications before your next appointment, please call your pharmacy*   Lab Work: None ordered If you have labs (blood work) drawn today and your tests are completely normal, you will receive your results only by: Marland Kitchen MyChart Message (if you have MyChart) OR . A paper copy in the mail If you have any lab test that is abnormal or we need to change your treatment, we will call you to review the results.   Testing/Procedures: None ordered   Follow-Up: At [facility], you and your health needs are our priority.  As part of our continuing mission to provide you with exceptional heart care, we have created designated Provider Care Teams.  These Care Teams include your primary Cardiologist (physician) and Advanced Practice Providers (APPs -  Physician Assistants and Nurse Practitioners) who all work together to provide you with the care you need, when you need it.  We recommend signing up for the patient portal called "MyChart".  Sign up information is provided on this After Visit Summary.  MyChart is used to connect with patients for Virtual Visits (Telemedicine).  Patients are able to view lab/test results, encounter notes, upcoming appointments, etc.  Non-urgent messages can be sent to your provider as well.   To learn more about what you can do with MyChart, go to [email or web address].    Your next appointment:   Your physician wants you to follow-up in: 6 months You will receive a reminder letter in the mail two months in advance. If you don't receive a letter, please call our office to schedule the follow-up appointment.   The format for your next appointment:   In Person  Provider:   You may see Nelva Bush, MD or one of the following Advanced Practice Providers on your designated Care Team:     Murray Hodgkins, NP  Christell Faith, PA-C  Marrianne Mood, PA-C  Cadence [place], [place]  Laurann Montana, NP    Other Instructions N/A

## 2021-03-27 ENCOUNTER — Telehealth: Payer: Self-pay | Admitting: Pharmacist

## 2021-03-27 NOTE — Chronic Care Management (AMB) (Signed)
Chronic Care Management Pharmacy Assistant   Name: Edward Schneider.  MRN: [id number] DOB: October 14, 1956    Reason for Encounter: Disease State-Diabetes Mellitus     Recent office visits:  03/05/21 Marnee Guarneri, NP PCP-General follow up. Recommend continue to monitor BP closely at home and document and bring levels to visit.  DASH diet at home. Return in 3 months.   Recent consult visits:  03/26/21 Nelva Bush (Cardiology)- Follow up. Decrease Benazepril from 20 mg to 10 mg daily. Follow up in 6 months.  03/06/21 Gardiner Barefoot, DPM(Podiatry)-Pain due to onychomycosis of toenails of both feet.Return in 10 weeks.   02/27/21 Ibtehal Shamleffer (Endocrinology)-Diabetes follow up. Increase Humalog to 22 units with Breakfast and continue 16 units with Lunch and 16 units with supper. Follow up in 6 months.  Hospital visits:  None in previous 6 months  Medications: Outpatient Encounter Medications as of 03/27/2021  Medication Sig   acetaminophen (TYLENOL) 650 MG CR tablet Take 1,300 mg by mouth every 8 (eight) hours as needed for pain.   albuterol (VENTOLIN HFA) 108 (90 Base) MCG/ACT inhaler Inhale 2 puffs into the lungs every 6 (six) hours as needed for wheezing or shortness of breath.   Ascorbic Acid (VITAMIN C) 1000 MG tablet Take 1,000 mg by mouth daily.    aspirin EC 81 MG EC tablet Take 1 tablet (81 mg total) by mouth daily.   atorvastatin (LIPITOR) 80 MG tablet Take 1 tablet (80 mg total) by mouth at bedtime.   benazepril (LOTENSIN) 10 MG tablet Take 1 tablet (10 mg total) by mouth daily.   calcium carbonate (OS-CAL) 600 MG TABS tablet Take 600 mg by mouth daily.    cetirizine (ZYRTEC) 10 MG tablet Take 10 mg by mouth daily.   Cholecalciferol (VITAMIN D3) 25 MCG (1000 UT) CHEW Chew 1,000 Units by mouth daily.    CINNAMON PO Take 1,000 mg by mouth 2 (two) times daily.    clobetasol cream (TEMOVATE) 7.07 % Apply 1 application topically 2 (two) times daily.   Continuous Blood  Gluc Sensor (FREESTYLE LIBRE 2 SENSOR) MISC Use as instructed to check blood sugar daily   cyclobenzaprine (FLEXERIL) 10 MG tablet Take 1 tablet (10 mg total) by mouth 3 (three) times daily as needed for muscle spasms.   dapagliflozin propanediol (FARXIGA) 5 MG TABS tablet Take 1 tablet (5 mg total) by mouth daily.   diclofenac Sodium (VOLTAREN) 1 % GEL Apply 2 g topically 4 (four) times daily as needed (pain).    diphenhydrAMINE (BENADRYL) 25 mg capsule Take 25 mg by mouth 2 (two) times daily.    doxepin (SINEQUAN) 25 MG capsule Take 50 mg by mouth at bedtime.    Dulaglutide (TRULICITY) 1.5 EM/7.5QG SOPN Inject 1.5 mg into the skin once a week.   DULoxetine (CYMBALTA) 60 MG capsule Take 1 capsule (60 mg total) by mouth at bedtime.   ezetimibe (ZETIA) 10 MG tablet Take 1 tablet by mouth once daily   Ginger, Zingiber officinalis, (GINGER PO) Take 1 Dose by mouth daily.   Ginseng 100 MG CAPS Take 100 mg by mouth daily.    Insulin Glargine (BASAGLAR KWIKPEN) 100 UNIT/ML Inject 32 Units into the skin daily.   insulin lispro (HUMALOG KWIKPEN) 200 UNIT/ML KwikPen Inject into the skin. Injecting 22 units with Breakfast, 16 units with lunch and supper   isosorbide mononitrate (IMDUR) 60 MG 24 hr tablet Take 1 tablet (60 mg total) by mouth daily.   Multiple Vitamins-Minerals (HAIR  SKIN AND NAILS FORMULA PO) Take 1 capsule by mouth daily.    mupirocin ointment (BACTROBAN) 2 % Apply 1 application topically daily.    NARCAN 4 MG/0.1ML LIQD nasal spray kit Place 0.4 mg into the nose as needed (opioid overdose).    nitroGLYCERIN (NITROSTAT) 0.4 MG SL tablet Place 0.4 mg under the tongue every 5 (five) minutes as needed for chest pain.    Omega-3 1000 MG CAPS Take 1,000 mg by mouth daily.    oxyCODONE (ROXICODONE) 15 MG immediate release tablet TAKE 1/2 TO 1 (ONE HALF TO ONE) TABLET BY MOUTH FOUR TO SIX TIMES DAILY IF TOLERATED NOTE TABLET IS 15 MG   pregabalin (LYRICA) 50 MG capsule TAKE 1 CAPSULE BY MOUTH  THREE TIMES DAILY   Rimegepant Sulfate (NURTEC) 75 MG TBDP Take one tablet (75 MG) by mouth every 24 hours as needed only for migraine.   sucralfate (CARAFATE) 1 g tablet TAKE 1 TABLET BY MOUTH 4 TIMES DAILY WITH MEALS AND AT BEDTIME   Tiotropium Bromide-Olodaterol (STIOLTO RESPIMAT) 2.5-2.5 MCG/ACT AERS Inhale 2 puffs into the lungs daily as needed (shortness of breath).   traZODone (DESYREL) 100 MG tablet Take 1 tablet (100 mg total) by mouth at bedtime as needed for sleep.   triamcinolone cream (KENALOG) 0.1 % Apply 1 application topically 2 (two) times daily.   vitamin A 10000 UNIT capsule Take 10,000 Units by mouth daily.    vitamin B-12 (CYANOCOBALAMIN) 1000 MCG tablet Take 1,000 mcg by mouth daily.    Vitamin E 400 units TABS Take 400 Units by mouth daily.    No facility-administered encounter medications on file as of 03/27/2021.    Recent Relevant Labs: Lab Results  Component Value Date/Time   HGBA1C 8.8 (A) 02/27/2021 09:05 AM   HGBA1C 9.7 (H) 12/10/2020 10:14 AM   HGBA1C 8.9 (H) 09/02/2020 02:40 PM   MICROALBUR 30 (H) 03/05/2021 09:19 AM   MICROALBUR 80 (H) 05/23/2020 08:34 AM    Kidney Function Lab Results  Component Value Date/Time   CREATININE 1.12 03/05/2021 09:19 AM   CREATININE 1.19 12/06/2020 03:54 PM   CREATININE 0.85 02/28/2014 06:45 PM   CREATININE 0.94 10/13/2013 11:42 AM   GFR 66.47 01/24/2019 02:39 PM   GFRNONAA 65 12/06/2020 03:54 PM   GFRNONAA >60 02/28/2014 06:45 PM   GFRAA 75 12/06/2020 03:54 PM   GFRAA >60 02/28/2014 06:45 PM    Current antihyperglycemic regimen:   Basaglar  30 units daily Humalog to 22 units with Breakfast and  16 units with Lunch and 16 units with supper Trulicity 1.5  mg weekly Farxiga 5 mg, 1 tablet with Breakfast  What recent interventions/DTPs have been made to improve glycemic control:  Increase Humalog to 22 units with Breakfast and continue 16 units with Lunch and 16 units with supper  Have there been any recent  hospitalizations or ED visits since last visit with CPP? No  Patient reports hypoglycemic symptoms, including Vision changes  Patient reports hyperglycemic symptoms, including blurry vision  How often are you checking your blood sugar? Patient states he checks his blood sugar 6-7 times.  What are your blood sugars ranging?  Fasting: 149 03/27/21 Before meals:  After meals: 103 03/27/21 Bedtime:   During the week, how often does your blood glucose drop below 70?  Rarely  Are you checking your feet daily/regularly?  Patient states he checks his feet and he follows up with podiatry.  Adherence Review: Is the patient currently on a STATIN medication? Yes  Is the patient currently on ACE/ARB medication? Yes Does the patient have >5 day gap between last estimated fill dates? No     Star Rating Drugs: Benazepril 10 mg last filled 03/20/21 90 DS Atorvastatin 80 mg last filled 65/78/46 90 DS Trulicity 1.5 mg last filled  Farxiga 5 mg last filled    [place] Pharmacist Assistant [phone]

## 2021-04-02 ENCOUNTER — Encounter: Payer: Self-pay | Admitting: Nurse Practitioner

## 2021-04-02 ENCOUNTER — Telehealth: Payer: Self-pay

## 2021-04-02 ENCOUNTER — Other Ambulatory Visit: Payer: Self-pay

## 2021-04-02 ENCOUNTER — Ambulatory Visit (INDEPENDENT_AMBULATORY_CARE_PROVIDER_SITE_OTHER): Payer: Medicare Other | Admitting: Nurse Practitioner

## 2021-04-02 ENCOUNTER — Ambulatory Visit
Admission: RE | Admit: 2021-04-02 | Discharge: 2021-04-02 | Disposition: A | Discharge status: Home / Self Care | Payer: Medicare Other | Source: Ambulatory Visit | Attending: Nurse Practitioner | Admitting: Nurse Practitioner

## 2021-04-02 VITALS — BP 98/65 | HR 89 | Temp 98.4°F | Wt 238.0 lb

## 2021-04-02 DIAGNOSIS — R1084 Generalized abdominal pain: Secondary | ICD-10-CM | POA: Diagnosis present

## 2021-04-02 LAB — CBC WITH DIFFERENTIAL/PLATELET
Hematocrit: 41.2 % (ref 37.5–51.0)
Hemoglobin: 13.1 g/dL (ref 13.0–17.7)
Lymphocytes Absolute: 2.4 10*3/uL (ref 0.7–3.1)
Lymphs: 26 %
MCH: 28.1 pg (ref 26.6–33.0)
MCHC: 31.8 g/dL (ref 31.5–35.7)
MCV: 88 fL (ref 79–97)
MID (Absolute): 0.9 10*3/uL (ref 0.1–1.6)
MID: 9 %
Neutrophils Absolute: 6 10*3/uL (ref 1.4–7.0)
Neutrophils: 65 %
Platelets: 191 10*3/uL (ref 150–450)
RBC: 4.67 x10E6/uL (ref 4.14–5.80)
RDW: 14.6 % (ref 11.6–15.4)
WBC: 9.3 10*3/uL (ref 3.4–10.8)

## 2021-04-02 NOTE — Progress Notes (Signed)
BP 98/65   Pulse 89   Temp 98.4 F (36.9 C) (Oral)   Wt 238 lb (108 kg)   SpO2 96%   BMI 36.19 kg/m    Subjective:    Patient ID: Edward Schneider., male    DOB: 27-Nov-1955, 65 y.o.   MRN: [id number]  HPI: Edward Schneider. is a 66 y.o. male  Chief Complaint  Patient presents with   Hernia    Patient states he has an abdominal hernia and states over the last few days (Sunday) his hernia is become more painful. Patient states he has been having decreased appetite and states it is bulging and states feels it is pulsating and moving around. Patient states he woke up crying in pain on Monday.    HERNIA ABDOMEN Reports he has abdominal hernia, which has been present for a few years.  Had imaging in 2019 and 2021 with no hernia noted.  States since Sunday he has had unbearable pain -- to left mid-quadrant.  Has lost his appetite due to pain and trouble sleeping.  Feels like area is pulsating and moving around.  Denies any hematemesis or blood in stool.  No back pain, other then chronic pain.    Duration:days Onset: sudden Severity: 10/10 Quality: sharp, dull, aching, and throbbing Location:   left mid quadrant   Episode duration: constant Radiation: no Frequency: constant Alleviating factors: nothing -- not even opioids Aggravating factors: unknown Status: fluctuating Treatments attempted:  opioids Fever: no Nausea: yes Vomiting: yes Weight loss: no Decreased appetite: yes Diarrhea: yes yesterday Constipation: no Blood in stool: no Heartburn: no Jaundice: no Rash: no Dysuria/urinary frequency: no Hematuria: no History of sexually transmitted disease: no Recurrent NSAID use: no   Relevant past medical, surgical, family and social history reviewed and updated as indicated. Interim medical history since our last visit reviewed. Allergies and medications reviewed and updated.  Review of Systems  Constitutional:  Positive for appetite change. Negative for activity  change, chills, diaphoresis, fatigue and fever.  Respiratory:  Negative for cough, chest tightness, shortness of breath and wheezing.   Cardiovascular: Negative.   Gastrointestinal:  Positive for abdominal pain, diarrhea, nausea and vomiting. Negative for abdominal distention, anal bleeding, blood in stool, constipation and rectal pain.  Neurological: Negative.    Per HPI unless specifically indicated above     Objective:    BP 98/65   Pulse 89   Temp 98.4 F (36.9 C) (Oral)   Wt 238 lb (108 kg)   SpO2 96%   BMI 36.19 kg/m   Wt Readings from Last 3 Encounters:  04/02/21 238 lb (108 kg)  03/26/21 239 lb (108.4 kg)  03/05/21 240 lb 12.8 oz (109.2 kg)    Physical Exam Vitals and nursing note reviewed.  Constitutional:      General: He is awake. He is not in acute distress.    Appearance: He is well-developed and well-groomed. He is obese. He is not ill-appearing.  HENT:     Head: Normocephalic and atraumatic.     Right Ear: Hearing normal. No drainage.     Left Ear: Hearing normal. No drainage.  Eyes:     General: Lids are normal.        Right eye: No discharge.        Left eye: No discharge.     Conjunctiva/sclera: Conjunctivae normal.     Pupils: Pupils are equal, round, and reactive to light.  Neck:     Thyroid:  No thyromegaly.     Vascular: No carotid bruit.     Trachea: Trachea normal.  Cardiovascular:     Rate and Rhythm: Normal rate and regular rhythm.     Heart sounds: Normal heart sounds, S1 normal and S2 normal. No murmur heard.   No gallop.  Pulmonary:     Effort: Pulmonary effort is normal. No accessory muscle usage or respiratory distress.     Breath sounds: Normal breath sounds.  Abdominal:     General: Bowel sounds are normal. There is no distension or abdominal bruit.     Palpations: Abdomen is soft.     Tenderness: There is generalized abdominal tenderness. There is guarding. There is no right CVA tenderness or left CVA tenderness. Negative signs  include Murphy's sign and Rovsing's sign.     Hernia: A hernia is present. Hernia is present in the umbilical area.     Comments: Generalized tenderness on palpation, more to left upper and mid-quadrant.  Tender to soft palpation, unable to perform deep due to significant tenderness.  Umbilical hernia present.  Musculoskeletal:        General: Normal range of motion.     Cervical back: Normal range of motion and neck supple.     Right lower leg: No edema.     Left lower leg: No edema.  Skin:    General: Skin is warm and dry.     Capillary Refill: Capillary refill takes less than 2 seconds.     Findings: No rash.     Comments: Scattered pale bruising to upper extremities and abrasions -- baseline.  Neurological:     Mental Status: He is alert and oriented to person, place, and time.     Deep Tendon Reflexes: Reflexes are normal and symmetric.  Psychiatric:        Attention and Perception: Attention normal.        Mood and Affect: Mood normal.        Behavior: Behavior normal. Behavior is cooperative.        Thought Content: Thought content normal.    Results for orders placed or performed in visit on 03/06/21  HM DIABETES EYE EXAM  Result Value Ref Range   HM Diabetic Eye Exam No Retinopathy No Retinopathy      Assessment & Plan:   Problem List Items Addressed This Visit       Other   Generalized abdominal pain - Primary    Acute since the weekend, significantly tender on exam.  Obtain labs CBC, CMP, Lipase, Amylase, GGT, TSH.  STAT abdominal CT ordered to further assess.  CBC in office noted no WBC elevation.  Will determine next steps after imaging reviewed, he is headed to obtain at this time.  May need to send to ER for further evaluation if abnormal findings, especially with current level of pain.  Will plan on return in 1 week sooner if worsening.       Relevant Orders   Comprehensive metabolic panel   CBC With Differential/Platelet   TSH   Lipase   Amylase   Gamma  GT   CT Abdomen Pelvis Wo Contrast     Follow up plan: Return in about 1 week (around 04/09/2021) for abdominal pain.

## 2021-04-02 NOTE — Progress Notes (Signed)
Attempted to call Mr. Speakman and left general message to call office -- please let him know that overall no acute findings to explain the pain.  There is a small fat containing umbilical hernia, but this is unchanged from previous imaging.  I would recommend if ongoing discomfort this evening he head into ER for further evaluation, as I will not have remainder of labs until morning and he may benefit further work-up based on the pain he is in.  Any questions? Keep being awesome!!  Thank you for allowing me to participate in your care.  I appreciate you. Kindest regards, Tyon Cerasoli

## 2021-04-02 NOTE — Telephone Encounter (Signed)
Called Dr Golden Circle advised of Jolene's message

## 2021-04-02 NOTE — Addendum Note (Signed)
Addended by: Georgina Peer on: 04/02/2021 10:07 AM   Modules accepted: Orders

## 2021-04-02 NOTE — Assessment & Plan Note (Addendum)
Acute since the weekend, significantly tender on exam.  Obtain labs CBC, CMP, Lipase, Amylase, GGT, TSH.  STAT abdominal CT ordered to further assess.  CBC in office noted no WBC elevation.  Will determine next steps after imaging reviewed, he is headed to obtain at this time.  May need to send to ER for further evaluation if abnormal findings, especially with current level of pain.  Will plan on return in 1 week sooner if worsening.

## 2021-04-02 NOTE — Telephone Encounter (Signed)
Copied from [place] [id number]. Topic: General - Other >> Apr 02, 2021 12:15 PM Tessa Lerner A wrote: Reason for CRM: Dr. Golden Circle with [facility] radiology has made contact requesting to speak with Prov. Cannady regarding patient's most recent procedure   Please contact to advise

## 2021-04-02 NOTE — Patient Instructions (Signed)
Abdominal Pain, Adult Many things can cause belly (abdominal) pain. Most times, belly pain is not dangerous. Many cases of belly pain can be watched and treated at home. Sometimes, though, belly pain is serious. Yourdoctor will try to find the cause of your belly pain. Follow these instructions at home:  Medicines Take over-the-counter and prescription medicines only as told by your doctor. Do not take medicines that help you poop (laxatives) unless told by your doctor. General instructions Watch your belly pain for any changes. Drink enough fluid to keep your pee (urine) pale yellow. Keep all follow-up visits as told by your doctor. This is important. Contact a doctor if: Your belly pain changes or gets worse. You are not hungry, or you lose weight without trying. You are having trouble pooping (constipated) or have watery poop (diarrhea) for more than 2-3 days. You have pain when you pee or poop. Your belly pain wakes you up at night. Your pain gets worse with meals, after eating, or with certain foods. You are vomiting and cannot keep anything down. You have a fever. You have blood in your pee. Get help right away if: Your pain does not go away as soon as your doctor says it should. You cannot stop vomiting. Your pain is only in areas of your belly, such as the right side or the left lower part of the belly. You have bloody or black poop, or poop that looks like tar. You have very bad pain, cramping, or bloating in your belly. You have signs of not having enough fluid or water in your body (dehydration), such as: Dark pee, very little pee, or no pee. Cracked lips. Dry mouth. Sunken eyes. Sleepiness. Weakness. You have trouble breathing or chest pain. Summary Many cases of belly pain can be watched and treated at home. Watch your belly pain for any changes. Take over-the-counter and prescription medicines only as told by your doctor. Contact a doctor if your belly pain  changes or gets worse. Get help right away if you have very bad pain, cramping, or bloating in your belly. This information is not intended to replace advice given to you by your health care provider. Make sure you discuss any questions you have with your healthcare provider. Document Revised: 02/13/2019 Document Reviewed: 02/13/2019 Elsevier Patient Education  2022 Elsevier Inc.  

## 2021-04-03 LAB — COMPREHENSIVE METABOLIC PANEL
ALT: 33 IU/L (ref 0–44)
AST: 29 IU/L (ref 0–40)
Albumin/Globulin Ratio: 1.9 (ref 1.2–2.2)
Albumin: 4.4 g/dL (ref 3.8–4.8)
Alkaline Phosphatase: 66 IU/L (ref 44–121)
BUN/Creatinine Ratio: 20 (ref 10–24)
BUN: 24 mg/dL (ref 8–27)
Bilirubin Total: 0.4 mg/dL (ref 0.0–1.2)
CO2: 19 mmol/L — ABNORMAL LOW (ref 20–29)
Calcium: 10.1 mg/dL (ref 8.6–10.2)
Chloride: 102 mmol/L (ref 96–106)
Creatinine, Ser: 1.19 mg/dL (ref 0.76–1.27)
Globulin, Total: 2.3 g/dL (ref 1.5–4.5)
Glucose: 150 mg/dL — ABNORMAL HIGH (ref 65–99)
Potassium: 4.9 mmol/L (ref 3.5–5.2)
Sodium: 138 mmol/L (ref 134–144)
Total Protein: 6.7 g/dL (ref 6.0–8.5)
eGFR: 68 mL/min/{1.73_m2} (ref >59)

## 2021-04-03 LAB — AMYLASE: Amylase: 78 U/L (ref 31–110)

## 2021-04-03 LAB — GAMMA GT: GGT: 81 IU/L — ABNORMAL HIGH (ref 0–65)

## 2021-04-03 LAB — TSH: TSH: 1.34 u[IU]/mL (ref 0.450–4.500)

## 2021-04-03 LAB — LIPASE: Lipase: 44 U/L (ref 13–78)

## 2021-04-03 NOTE — Progress Notes (Signed)
Good morning, please let Edward Schneider know his blood work has returned.  CBC showed no elevation in white blood cells, no infection.  Kidney and liver function are normal.  Pancreas labs are in normal range.  GGT, which can look at gall bladder is mildly elevated.  Are you still in pain?  If so I would recommend visit to ER today for further testing and work-up due to recent CT showing no acute findings and labs being stable.  Any questions? Keep being awesome!!  Thank you for allowing me to participate in your care.  I appreciate you. Kindest regards, Gaynel Schaafsma

## 2021-04-24 ENCOUNTER — Other Ambulatory Visit: Payer: Self-pay | Admitting: Nurse Practitioner

## 2021-04-24 ENCOUNTER — Other Ambulatory Visit: Payer: Self-pay | Admitting: Internal Medicine

## 2021-04-24 NOTE — Telephone Encounter (Signed)
Requested medication (s) are due for refill today: no order   Requested medication (s) are on the active medication list: no   Last refill:  last order was for protonix 20 mg twice a day and discontinued 11/01/2015, not taken in 30 days. OTC med stopped 04/05/21 at discharge  Future visit scheduled: yes in 1 month   Notes to clinic:  called patient to clarify if he was taking omeprazole 40 mg twice a day with meals. Please clarify if patient should still take medication.     Requested Prescriptions  Pending Prescriptions Disp Refills   pantoprazole (PROTONIX) 40 MG tablet [Pharmacy Med Name: Pantoprazole Sodium 40 MG Oral Tablet Delayed Release] 180 tablet 0    Sig: Take 1 tablet by mouth twice daily      Gastroenterology: Proton Pump Inhibitors Passed - 04/24/2021 11:31 AM      Passed - Valid encounter within last 12 months    Recent Outpatient Visits           3 weeks ago Generalized abdominal pain   [place], Jolene T, NP   1 month ago Uncontrolled type 2 diabetes mellitus with hyperglycemia ([place])   [place], Jolene T, NP   4 months ago Uncontrolled type 2 diabetes mellitus with hyperglycemia ([place])   [place], Thoreau T, NP   5 months ago Lab test positive for detection of COVID-19 virus   [place], Santiago Glad, NP   7 months ago Uncontrolled type 2 diabetes mellitus with hyperglycemia ([place])   [place], Barbaraann Faster, NP       Future Appointments             In 1 month Cannady, Barbaraann Faster, NP [facility] [facility]   In 1 month Laurin Coder, MD [facility]

## 2021-04-24 NOTE — Telephone Encounter (Signed)
Copied from [place] [phone]. Topic: General - Other >> Apr 24, 2021  1:33 PM Bayard Beaver wrote: Reason for EEF:EOFHQRF called in said he received call today about question for his medication. The message didn't say which medication. Please call back

## 2021-04-25 ENCOUNTER — Other Ambulatory Visit: Payer: Self-pay | Admitting: Internal Medicine

## 2021-04-28 DIAGNOSIS — L281 Prurigo nodularis: Secondary | ICD-10-CM | POA: Diagnosis not present

## 2021-04-28 DIAGNOSIS — L853 Xerosis cutis: Secondary | ICD-10-CM | POA: Diagnosis not present

## 2021-05-05 ENCOUNTER — Other Ambulatory Visit: Payer: Medicare Other

## 2021-05-05 ENCOUNTER — Telehealth: Payer: Self-pay

## 2021-05-05 ENCOUNTER — Other Ambulatory Visit: Payer: Self-pay

## 2021-05-05 DIAGNOSIS — E1142 Type 2 diabetes mellitus with diabetic polyneuropathy: Secondary | ICD-10-CM

## 2021-05-05 NOTE — Telephone Encounter (Signed)
Copied from [place] [phone]. Topic: General - Call Back - No Documentation >> May 05, 2021  1:22 PM Erick Blinks wrote: Reason for CRM: Pt called and wants to discuss his lab results, he wants to know what his A1C is. Please advise  Best contact: [phone]

## 2021-05-05 NOTE — Telephone Encounter (Signed)
Called to gather more information from patient regarding his A1c. Lab result wasn't released and patient states he needs to know ASAP about so that his doctor with [facility] move forward with scheduling his surgery. Patient states he has to have a A1c below 8. Advised patient I would make the provider aware and once results are received someone of the clinical staff would give him a call back. Patient verbalized understanding. Please advise?

## 2021-05-06 DIAGNOSIS — E1142 Type 2 diabetes mellitus with diabetic polyneuropathy: Secondary | ICD-10-CM | POA: Diagnosis not present

## 2021-05-06 LAB — HEMOGLOBIN A1C
Est. average glucose Bld gHb Est-mCnc: 171 mg/dL
Hgb A1c MFr Bld: 7.6 % — ABNORMAL HIGH (ref 4.8–5.6)

## 2021-05-06 NOTE — Progress Notes (Signed)
Please call Edward Schneider and let him know A1c is 7.6% for surgery and if he needs this sent to surgeon please let us know and we can fax over.  Have a great day and good luck on surgery.

## 2021-05-06 NOTE — Telephone Encounter (Signed)
Patient notified and verbalized understanding. 

## 2021-05-08 ENCOUNTER — Other Ambulatory Visit: Payer: Self-pay | Admitting: Orthopedic Surgery

## 2021-05-14 DIAGNOSIS — M4807 Spinal stenosis, lumbosacral region: Secondary | ICD-10-CM | POA: Diagnosis not present

## 2021-05-14 DIAGNOSIS — M48062 Spinal stenosis, lumbar region with neurogenic claudication: Secondary | ICD-10-CM | POA: Diagnosis not present

## 2021-05-14 DIAGNOSIS — M545 Low back pain, unspecified: Secondary | ICD-10-CM | POA: Diagnosis not present

## 2021-05-14 DIAGNOSIS — M47897 Other spondylosis, lumbosacral region: Secondary | ICD-10-CM | POA: Diagnosis not present

## 2021-05-14 DIAGNOSIS — M9951 Intervertebral disc stenosis of neural canal of cervical region: Secondary | ICD-10-CM | POA: Diagnosis not present

## 2021-05-14 DIAGNOSIS — Z79891 Long term (current) use of opiate analgesic: Secondary | ICD-10-CM | POA: Diagnosis not present

## 2021-05-14 DIAGNOSIS — M5136 Other intervertebral disc degeneration, lumbar region: Secondary | ICD-10-CM | POA: Diagnosis not present

## 2021-05-14 DIAGNOSIS — G4700 Insomnia, unspecified: Secondary | ICD-10-CM | POA: Diagnosis not present

## 2021-05-14 DIAGNOSIS — M79669 Pain in unspecified lower leg: Secondary | ICD-10-CM | POA: Diagnosis not present

## 2021-05-14 DIAGNOSIS — M9941 Connective tissue stenosis of neural canal of cervical region: Secondary | ICD-10-CM | POA: Diagnosis not present

## 2021-05-14 DIAGNOSIS — R609 Edema, unspecified: Secondary | ICD-10-CM | POA: Diagnosis not present

## 2021-05-14 DIAGNOSIS — G894 Chronic pain syndrome: Secondary | ICD-10-CM | POA: Diagnosis not present

## 2021-05-14 DIAGNOSIS — M4726 Other spondylosis with radiculopathy, lumbar region: Secondary | ICD-10-CM | POA: Diagnosis not present

## 2021-05-14 DIAGNOSIS — M792 Neuralgia and neuritis, unspecified: Secondary | ICD-10-CM | POA: Diagnosis not present

## 2021-05-14 DIAGNOSIS — G8929 Other chronic pain: Secondary | ICD-10-CM | POA: Diagnosis not present

## 2021-05-15 ENCOUNTER — Other Ambulatory Visit: Payer: Self-pay

## 2021-05-15 ENCOUNTER — Encounter: Payer: Self-pay | Admitting: Podiatry

## 2021-05-15 ENCOUNTER — Ambulatory Visit: Payer: Medicare Other | Admitting: Podiatry

## 2021-05-15 DIAGNOSIS — D689 Coagulation defect, unspecified: Secondary | ICD-10-CM

## 2021-05-15 DIAGNOSIS — E1142 Type 2 diabetes mellitus with diabetic polyneuropathy: Secondary | ICD-10-CM

## 2021-05-15 DIAGNOSIS — M79675 Pain in left toe(s): Secondary | ICD-10-CM

## 2021-05-15 DIAGNOSIS — M79674 Pain in right toe(s): Secondary | ICD-10-CM

## 2021-05-15 DIAGNOSIS — B351 Tinea unguium: Secondary | ICD-10-CM

## 2021-05-15 NOTE — Progress Notes (Signed)
This patient returns to my office for at risk foot care.  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  This patient is unable to cut nails himself since the patient cannot reach his nails.These nails are painful walking and wearing shoes.  This patient presents for at risk foot care today.  General Appearance  Alert, conversant and in no acute stress.  Vascular  Dorsalis pedis and posterior tibial  pulses are palpable  bilaterally.  Capillary return is within normal limits  bilaterally. Temperature is within normal limits  bilaterally.  Neurologic  Senn-Weinstein monofilament wire test within normal limits  bilaterally. Muscle power within normal limits bilaterally.  Nails Thick disfigured discolored nails with subungual debris  from hallux to fifth toes bilaterally. No evidence of bacterial infection or drainage bilaterally.  Orthopedic  No limitations of motion  feet .  No crepitus or effusions noted.  No bony pathology or digital deformities noted.  Skin  normotropic skin with no porokeratosis noted bilaterally.  No signs of infections or ulcers noted.     Onychomycosis  Pain in right toes  Pain in left toes  Consent was obtained for treatment procedures.   Mechanical debridement of nails 1-5  bilaterally performed with a nail nipper.  Filed with dremel without incident.    Return office visit   10 weeks                   Told patient to return for periodic foot care and evaluation due to potential at risk complications.   Gardiner Barefoot DPM

## 2021-05-22 ENCOUNTER — Other Ambulatory Visit: Payer: Self-pay | Admitting: Nurse Practitioner

## 2021-05-27 ENCOUNTER — Other Ambulatory Visit: Payer: Self-pay

## 2021-05-27 ENCOUNTER — Encounter
Admission: RE | Admit: 2021-05-27 | Discharge: 2021-05-27 | Disposition: A | Discharge status: Home / Self Care | Payer: Medicare Other | Source: Ambulatory Visit | Attending: Orthopedic Surgery | Admitting: Orthopedic Surgery

## 2021-05-27 ENCOUNTER — Telehealth: Payer: Self-pay | Admitting: *Deleted

## 2021-05-27 DIAGNOSIS — Z79891 Long term (current) use of opiate analgesic: Secondary | ICD-10-CM | POA: Insufficient documentation

## 2021-05-27 DIAGNOSIS — M1611 Unilateral primary osteoarthritis, right hip: Secondary | ICD-10-CM | POA: Insufficient documentation

## 2021-05-27 DIAGNOSIS — Z79899 Other long term (current) drug therapy: Secondary | ICD-10-CM | POA: Diagnosis not present

## 2021-05-27 DIAGNOSIS — Z01812 Encounter for preprocedural laboratory examination: Secondary | ICD-10-CM | POA: Insufficient documentation

## 2021-05-27 DIAGNOSIS — Z7984 Long term (current) use of oral hypoglycemic drugs: Secondary | ICD-10-CM | POA: Diagnosis not present

## 2021-05-27 DIAGNOSIS — Z7982 Long term (current) use of aspirin: Secondary | ICD-10-CM | POA: Insufficient documentation

## 2021-05-27 DIAGNOSIS — Z7902 Long term (current) use of antithrombotics/antiplatelets: Secondary | ICD-10-CM | POA: Diagnosis not present

## 2021-05-27 DIAGNOSIS — G8929 Other chronic pain: Secondary | ICD-10-CM | POA: Diagnosis not present

## 2021-05-27 HISTORY — DX: Atherosclerotic heart disease of native coronary artery without angina pectoris: I25.10

## 2021-05-27 LAB — URINALYSIS, ROUTINE W REFLEX MICROSCOPIC
Bilirubin Urine: NEGATIVE
Glucose, UA: NEGATIVE mg/dL
Hgb urine dipstick: NEGATIVE
Ketones, ur: NEGATIVE mg/dL
Leukocytes,Ua: NEGATIVE
Nitrite: NEGATIVE
Protein, ur: NEGATIVE mg/dL
Specific Gravity, Urine: 1.032 — ABNORMAL HIGH (ref 1.005–1.030)
pH: 5 (ref 5.0–8.0)

## 2021-05-27 LAB — TYPE AND SCREEN
ABO/RH(D): A POS
Antibody Screen: NEGATIVE

## 2021-05-27 LAB — CBC WITH DIFFERENTIAL/PLATELET
Abs Immature Granulocytes: 0.04 10*3/uL (ref 0.00–0.07)
Basophils Absolute: 0.1 10*3/uL (ref 0.0–0.1)
Basophils Relative: 1 %
Eosinophils Absolute: 0.3 10*3/uL (ref 0.0–0.5)
Eosinophils Relative: 3 %
HCT: 38.5 % — ABNORMAL LOW (ref 39.0–52.0)
Hemoglobin: 12.9 g/dL — ABNORMAL LOW (ref 13.0–17.0)
Immature Granulocytes: 0 %
Lymphocytes Relative: 33 %
Lymphs Abs: 3.3 10*3/uL (ref 0.7–4.0)
MCH: 28.7 pg (ref 26.0–34.0)
MCHC: 33.5 g/dL (ref 30.0–36.0)
MCV: 85.7 fL (ref 80.0–100.0)
Monocytes Absolute: 0.8 10*3/uL (ref 0.1–1.0)
Monocytes Relative: 8 %
Neutro Abs: 5.5 10*3/uL (ref 1.7–7.7)
Neutrophils Relative %: 55 %
Platelets: 219 10*3/uL (ref 150–400)
RBC: 4.49 MIL/uL (ref 4.22–5.81)
RDW: 14.3 % (ref 11.5–15.5)
WBC: 10 10*3/uL (ref 4.0–10.5)
nRBC: 0 % (ref 0.0–0.2)

## 2021-05-27 LAB — COMPREHENSIVE METABOLIC PANEL
ALT: 31 U/L (ref 0–44)
AST: 39 U/L (ref 15–41)
Albumin: 4 g/dL (ref 3.5–5.0)
Alkaline Phosphatase: 51 U/L (ref 38–126)
Anion gap: 11 (ref 5–15)
BUN: 18 mg/dL (ref 8–23)
CO2: 24 mmol/L (ref 22–32)
Calcium: 10.5 mg/dL — ABNORMAL HIGH (ref 8.9–10.3)
Chloride: 100 mmol/L (ref 98–111)
Creatinine, Ser: 1.21 mg/dL (ref 0.61–1.24)
GFR, Estimated: >60 mL/min (ref >60)
Glucose, Bld: 159 mg/dL — ABNORMAL HIGH (ref 70–99)
Potassium: 4.5 mmol/L (ref 3.5–5.1)
Sodium: 135 mmol/L (ref 135–145)
Total Bilirubin: 1 mg/dL (ref 0.3–1.2)
Total Protein: 7.7 g/dL (ref 6.5–8.1)

## 2021-05-27 LAB — SURGICAL PCR SCREEN
MRSA, PCR: NEGATIVE
Staphylococcus aureus: NEGATIVE

## 2021-05-27 NOTE — Telephone Encounter (Addendum)
   [facility] Pre-operative Risk Assessment    Patient Name: Edward Schneider.  DOB: 17-May-1956 MRN: [id number]  HEARTCARE STAFF:  - IMPORTANT!!!!!! Under Visit Info/Reason for Call, type in Other and utilize the format Clearance MM/DD/YY or Clearance TBD. Do not use dashes or single digits. - Please review there is not already an duplicate clearance open for this procedure. - If request is for dental extraction, please clarify the # of teeth to be extracted. - If the patient is currently at the dentist's office, call Pre-Op Callback Staff (MA/nurse) to input urgent request.  - If the patient is not currently in the dentist office, please route to the Pre-Op pool.   Juventino Slovak 05/27/2021, 12:20 PM  _________________________________________________________________   (provider comments below)     Message from Karen Kitchens, NP sent at 05/27/2021  8:40 AM EDT  Regarding: Request for pre-operative cardiac clearance Request for pre-operative cardiac clearance:   1. What type of surgery is being performed?  RIGHT TOTAL HIP ARTHROPLASTY ANTERIOR APPROACH  2. When is this surgery scheduled?  06/03/2021   3. Are there any medications that need to be held prior to surgery? ASA  4. Practice name and name of physician performing surgery?  Performing surgeon: Dr. Hessie Knows, MD Requesting clearance: Honor Loh, FNP-C     5. Anesthesia type (none, local, MAC, general)? General   6. What is the office phone and fax number?   Phone: [phone] Fax: [phone]  ATTENTION: Unable to create telephone message as per your standard workflow. Directed by [facility] providers to send requests for cardiac clearance to this pool for appropriate distribution to provider covering pre-operative clearances.   Honor Loh, MSN, APRN, FNP-C, CEN [facility]  Peri-operative Services Nurse Practitioner Phone: [phone] 05/27/21 8:40 AM

## 2021-05-27 NOTE — Telephone Encounter (Signed)
Left a message for the patient to call back and speak to the on-call preop APP of the day 

## 2021-05-27 NOTE — Patient Instructions (Addendum)
Your procedure is scheduled on: 06/03/21 Report to [place]. To find out your arrival time please call [phone] between 1PM - 3PM on 06/02/21  Covid test Friday 05/30/21 8am-12pm [facility], [address].  Remember: Instructions that are not followed completely may result in serious medical risk, up to and including death, or upon the discretion of your surgeon and anesthesiologist your surgery may need to be rescheduled.     _X__ 1. Do not eat food after midnight the night before your procedure.                 No gum chewing or hard candies. You may drink clear liquids up to 2 hours                 before you are scheduled to arrive for your surgery- DO not drink clear                 liquids within 2 hours of the start of your surgery.                 Diabetics water only  __X__2.  On the morning of surgery brush your teeth with toothpaste and water, you                 may rinse your mouth with mouthwash if you wish.  Do not swallow any              toothpaste of mouthwash.     _X__ 3.  No Alcohol for 24 hours before or after surgery.   _X__ 4.  Do Not Smoke or use e-cigarettes For 24 Hours Prior to Your Surgery.                 Do not use any chewable tobacco products for at least 6 hours prior to                 surgery.  ____  5.  Bring all medications with you on the day of surgery if instructed.   __X__  6.  Notify your doctor if there is any change in your medical condition      (cold, fever, infections).     Do not wear jewelry, make-up, hairpins, clips or nail polish. Do not wear lotions, powders, or perfumes.  Do not shave BODY HAIR 48 hours prior to surgery. Men may shave face and neck. Do not bring valuables to the hospital.    [facility] is not responsible for any belongings or valuables.  Contacts, dentures/partials or body piercings may not be worn into surgery. Bring a case for your contacts,  glasses or hearing aids, a denture cup will be supplied. Leave your suitcase in the car. After surgery it may be brought to your room. For patients admitted to the hospital, discharge time is determined by your treatment team.   Patients discharged the day of surgery will not be allowed to drive home.   Please read over the following fact sheets that you were given:   MRSA Information. CHG soap, , Incentive Spirometer  __X__ Take these medicines the morning of surgery with A SIP OF WATER:    1. cetirizine (ZYRTEC) 10 MG tablet  2. doxycycline (VIBRAMYCIN) 100 MG capsule  3. DULoxetine (CYMBALTA) 60 MG capsule  4. ezetimibe (ZETIA) 10 MG tablet  5. isosorbide mononitrate (IMDUR) 60 MG 24 hr tablet  6. oxyCODONE (ROXICODONE) 15  MG immediate release tablet  7. pregabalin (LYRICA) 50 MG capsule  ____ Fleet Enema (as directed)   __X__ Use CHG Soap OR DIAL (orange bar)/SAGE wipes as directed  __X__ Use inhalers on the day of surgery  __X__ Stop Adeline  prior to surgery    __X__ Take 1/2 of usual insulin dose the night before surgery. No insulin the morning          of surgery.   ____ Stop Blood Thinners Coumadin/Plavix/Xarelto/Pleta/Pradaxa/Eliquis/Effient/Aspirin  on   Or contact your Surgeon, Cardiologist or Medical Doctor regarding  ability to stop your blood thinners  __X__ Stop Anti-inflammatories 7 days before surgery such as Advil, Ibuprofen, Motrin,  BC or Goodies Powder, Naprosyn, Naproxen, Aleve, Aspirin   Stop Excedrin also (05/27/21)  __X__ Stop all herbal supplements, fish oil or vitamin E until after surgery. HOLD ALL SUPPLEMENTS BEGINNING TODAY 05/27/21   __X__ Bring C-Pap to the hospital.
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
## 2021-05-28 ENCOUNTER — Encounter: Payer: Self-pay | Admitting: Orthopedic Surgery

## 2021-05-28 NOTE — Progress Notes (Addendum)
Perioperative Services  Pre-Admission/Anesthesia Testing Clinical Review  Date: 05/28/21  Patient Demographics:  Name: Edward Schneider. DOB:   May 10, 1956 MRN:   [id number]  Planned Surgical Procedure(s):    Case: [id number] Date/Time: 06/03/21 1208   Procedure: TOTAL HIP ARTHROPLASTY ANTERIOR APPROACH (Right: Hip)   Anesthesia type: Choice   Pre-op diagnosis: Primary localized osteoarthritis of right hip M16.11   Location: [facility] OR ROOM 01 / [place] ORS FOR ANESTHESIA GROUP   Surgeons: Hessie Knows, MD     NOTE: Available PAT nursing documentation and vital signs have been reviewed. Clinical nursing staff has updated patient's PMH/PSHx, current medication list, and drug allergies/intolerances to ensure comprehensive history available to assist in medical decision making as it pertains to the aforementioned surgical procedure and anticipated anesthetic course. Extensive review of available clinical information performed. Edward Schneider PMH and PSHx updated with any diagnoses/procedures that  may have been inadvertently omitted during his intake with the pre-admission testing department's nursing staff.  Clinical Discussion:  Edward Schneider. is a 65 y.o. male who is submitted for pre-surgical anesthesia review and clearance prior to him undergoing the above procedure. Patient is a Former Smoker (100 pack years; quit 10/2009). Pertinent PMH includes: CAD, MI, diastolic dysfunction (P9JK), aortic atherosclerosis, CVA, HTN, HLD, T2DM, asthma, OSAH (requires nocturnal PAP therapy), GERD (no daily Tx), childhood seizures, liver cancer, DDD.  Patient is followed by cardiology (End, MD). He was last seen in the cardiology clinic on 04/15/2021; notes reviewed. At the time of his clinic visit, the patient denied any chest pain, shortness of breath, PND, orthopnea, palpitations, significant peripheral edema, vertiginous symptoms, or presyncope/syncope.  Patient with a PMH significant for cardiovascular  diagnoses.  Patient suffered a MI in the past.  He was treated with PTCA.  Details regarding cardiac [place].  TTE performed on 01/14/2019 revealed a low normal left ventricular systolic function with EF 50-55%.  There were no significant valvular abnormalities.  Diagnostic left heart catheterization performed on 09/05/2019 revealing two-vessel CAD with a 60% proximal LAD stenosis (iFR 0.87) and moderate disease involving the rPL branches.  Left ventricular systolic function and filling pressure normal.  PCI was performed and a 2.75 x 18 mm Resolute Onyx DES x 1 was placed to the proximal/mid LAD resulting in a 0% residual stenosis and restoration of TIMI-3 flow.  Repeat TTE performed on 12/14/2019 revealed a normal left ventricular systolic function with an EF of 60 to 65%.  Doppler parameters consistent with impaired relaxation (G1DD).  There were no RWMAs or significant valvular abnormalities (see full interpretation of cardiovascular testing and intervention below).  Patient with intentional 20 pound weight loss resulting in an overall decrease of his blood pressure. Blood pressure borderline low at 94/64 on currently prescribed ACEi and nitrate therapies.  She is on ezetimibe and a statin for her HLD diagnosis. T2DM reasonably controlled on currently prescribed regimen; last Hgb A1c 7.6% when last checked on 05/05/2021. Functional capacity, as defined by DASI, is documented as being >/= 4 METS.  No changes were made to patient's medication regimen.  Patient to follow-up with outpatient cardiology in 6 months or sooner if needed.  Patient is scheduled for an elective total hip arthroplasty on 06/03/2021 with Dr. Hessie Knows, MD.  Given patient's past medical history significant for cardiovascular diagnoses, presurgical cardiac clearance was sought by the performing surgeon's office and PAT team. Per cardiology, "based on patient's past medical  history  and time since his last clinic visit, patient would be at an overall ACCEPTABLE risk for the planned procedure without further cardiovascular testing or intervention at this time".  This patient is on daily antiplatelet therapy. He has been instructed on recommendations from his cardiologist regarding the need to continue his daily low-dose ASA throughout the perioperative period.  Patient denies previous perioperative complications with anesthesia in the past. In review of the available records, it is noted that patient underwent a general anesthetic course here (ASA III) in 05/2020 without documented complications.   Vitals with BMI 05/27/2021 04/02/2021 03/26/2021  Height 5' 8"  - 5' 8"   Weight 240 lbs 238 lbs 239 lbs  BMI 36.5 33.8 25.05  Systolic 397 98 94  Diastolic 75 65 64  Pulse 88 89 93    Providers/Specialists:   NOTE: Primary physician provider listed below. Patient may have been seen by APP or partner within same practice.   PROVIDER ROLE / SPECIALTY LAST Fabio Bering, MD Orthopedics (Surgeon) 09/04/2020  [name], NP Primary Care Provider 04/02/2021  Nelva Bush, MD Cardiology 03/26/2021   Allergies:  Bee venom, Crestor [rosuvastatin calcium], Fentanyl, Gabapentin, Shellfish allergy, Furosemide, Buprenorphine hcl, and Simvastatin  Current Home Medications:   No current facility-administered medications for this encounter.    acetaminophen (TYLENOL) 650 MG CR tablet   albuterol (VENTOLIN HFA) 108 (90 Base) MCG/ACT inhaler   Ascorbic Acid (VITAMIN C) 1000 MG tablet   aspirin EC 81 MG EC tablet   aspirin-acetaminophen-caffeine (EXCEDRIN MIGRAINE) 250-250-65 MG tablet   atorvastatin (LIPITOR) 80 MG tablet   benazepril (LOTENSIN) 10 MG tablet   calcium carbonate (OS-CAL) 600 MG TABS tablet   cephALEXin (KEFLEX) 500 MG capsule   cetirizine (ZYRTEC) 10 MG tablet   Cholecalciferol (VITAMIN D3) 25 MCG (1000 UT) CHEW   CINNAMON PO   clobetasol cream  (TEMOVATE) 0.05 %   Continuous Blood Gluc Sensor (FREESTYLE LIBRE 2 SENSOR) MISC   cyclobenzaprine (FLEXERIL) 10 MG tablet   dapagliflozin propanediol (FARXIGA) 5 MG TABS tablet   diclofenac Sodium (VOLTAREN) 1 % GEL   diphenhydrAMINE (BENADRYL) 25 mg capsule   doxepin (SINEQUAN) 25 MG capsule   doxycycline (VIBRAMYCIN) 100 MG capsule   Dulaglutide (TRULICITY) 1.5 QB/3.4LP SOPN   DULoxetine (CYMBALTA) 30 MG capsule   DULoxetine (CYMBALTA) 60 MG capsule   ezetimibe (ZETIA) 10 MG tablet   Ginger, Zingiber officinalis, (GINGER PO)   Ginseng 100 MG CAPS   Insulin Glargine (BASAGLAR KWIKPEN) 100 UNIT/ML   insulin lispro (HUMALOG KWIKPEN) 200 UNIT/ML KwikPen   isosorbide mononitrate (IMDUR) 60 MG 24 hr tablet   Multiple Vitamins-Minerals (CENTRUM SILVER 50+MEN) TABS   Multiple Vitamins-Minerals (HAIR SKIN AND NAILS FORMULA PO)   mupirocin ointment (BACTROBAN) 2 %   NARCAN 4 MG/0.1ML LIQD nasal spray kit   nitroGLYCERIN (NITROSTAT) 0.4 MG SL tablet   Omega-3 1000 MG CAPS   oxyCODONE (ROXICODONE) 15 MG immediate release tablet   pregabalin (LYRICA) 50 MG capsule   Rimegepant Sulfate (NURTEC) 75 MG TBDP   sucralfate (CARAFATE) 1 g tablet   Tiotropium Bromide-Olodaterol (STIOLTO RESPIMAT) 2.5-2.5 MCG/ACT AERS   traZODone (DESYREL) 100 MG tablet   triamcinolone cream (KENALOG) 0.1 %   vitamin A 10000 UNIT capsule   vitamin B-12 (CYANOCOBALAMIN) 1000 MCG tablet   Vitamin E 400 units TABS   History:   Past Medical History:  Diagnosis Date   Allergy    Aortic atherosclerosis (HCC)    Asthma    C.  difficile diarrhea    Chronic pain    Collagenous colitis    Coronary artery disease    a.) PCI with 2.75 x 18 mm Resolute Onyx DES x 1 to prox/mid LAD on 09/05/2019   DDD (degenerative disc disease), cervical    DDD (degenerative disc disease), lumbar    GERD (gastroesophageal reflux disease)    Grade I diastolic dysfunction    Hepatic steatosis    Hyperlipidemia    Hypertension     Liver cancer ([place]) 03/2015   Migraines    Myocardial infarction (HCC)     OSA on CPAP    Seizures ([place])    several as child when sick.  None since age 70   Stroke [facility])    'mini-stroke" 30 yrs ago. no deficits.   T2DM (type 2 diabetes mellitus) ([place])    Wears dentures    full upper and lower   Past Surgical History:  Procedure Laterality Date   APPENDECTOMY     BACK SURGERY     CARDIAC CATHETERIZATION     No stent placed in his "77's"   CERVICAL FUSION     COLONOSCOPY WITH PROPOFOL N/A 03/06/2016   Procedure: COLONOSCOPY WITH PROPOFOL;  Surgeon: Lucilla Lame, MD;  Location: [facility];  Service: Endoscopy;  Laterality: N/A;  requests early   COLONOSCOPY WITH PROPOFOL N/A 06/18/2020   Procedure: COLONOSCOPY WITH PROPOFOL;  Surgeon: Lucilla Lame, MD;  Location: [facility] ENDOSCOPY;  Service: Endoscopy;  Laterality: N/A;   ESOPHAGOGASTRODUODENOSCOPY (EGD) WITH PROPOFOL N/A 09/20/2017   Procedure: ESOPHAGOGASTRODUODENOSCOPY (EGD) WITH PROPOFOL;  Surgeon: Lucilla Lame, MD;  Location: [place];  Service: Endoscopy;  Laterality: N/A;  Diabetic - oral meds   FINGER SURGERY Left    INTRAVASCULAR PRESSURE WIRE/FFR STUDY N/A 09/05/2019   Procedure: INTRAVASCULAR PRESSURE WIRE/FFR STUDY;  Surgeon: Nelva Bush, MD;  Location: [place] CV LAB;  Service: Cardiovascular;  Laterality: N/A;   KNEE SURGERY Right    LEFT HEART CATH AND CORONARY ANGIOGRAPHY Left 09/05/2019   Procedure: LEFT HEART CATH AND CORONARY ANGIOGRAPHY (2.75 x 18 mm Resolute Onyx DES x 1 to prox/mid LAD);  Surgeon: Nelva Bush, MD;  Location: [place] CV LAB;  Service: Cardiovascular;  Laterality: Left;   NECK SURGERY     spleen surgery     TOE SURGERY Right    Family History  Problem Relation Age of Onset   Arthritis Mother    Diabetes Mother    Kidney disease Mother    Heart disease Mother    Hypertension Mother    Arthritis Father    Hearing loss Father    Hypertension Father     Heart disease Father    Diabetes Sister    Heart disease Sister    Diabetes Daughter    Diabetes Maternal Aunt    Diabetes Maternal Grandmother    Heart Problems Brother    Heart Problems Brother    Heart Problems Brother    Heart attack Maternal Grandfather    Colon cancer Paternal Grandfather    Social History   Tobacco Use   Smoking status: Former    Packs/day: 2.00    Years: 50.00    Pack years: 100.00    Types: Cigarettes    Quit date: 2011    Years since quitting: 11.6   Smokeless tobacco: Never  Vaping Use   Vaping Use: Never used  Substance Use Topics   Alcohol use: No    Alcohol/week: 0.0 standard drinks  Drug use: No    Pertinent Clinical Results:  LABS: Labs reviewed: Acceptable for surgery.  Hospital Outpatient Visit on 05/27/2021  Component Date Value Ref Range Status   WBC 05/27/2021 10.0  4.0 - 10.5 K/uL Final   RBC 05/27/2021 4.49  4.22 - 5.81 MIL/uL Final   Hemoglobin 05/27/2021 12.9 (A) 13.0 - 17.0 g/dL Final   HCT 05/27/2021 38.5 (A) 39.0 - 52.0 % Final   MCV 05/27/2021 85.7  80.0 - 100.0 fL Final   MCH 05/27/2021 28.7  26.0 - 34.0 pg Final   MCHC 05/27/2021 33.5  30.0 - 36.0 g/dL Final   RDW 05/27/2021 14.3  11.5 - 15.5 % Final   Platelets 05/27/2021 219  150 - 400 K/uL Final   nRBC 05/27/2021 0.0  0.0 - 0.2 % Final   Neutrophils Relative % 05/27/2021 55  % Final   Neutro Abs 05/27/2021 5.5  1.7 - 7.7 K/uL Final   Lymphocytes Relative 05/27/2021 33  % Final   Lymphs Abs 05/27/2021 3.3  0.7 - 4.0 K/uL Final   Monocytes Relative 05/27/2021 8  % Final   Monocytes Absolute 05/27/2021 0.8  0.1 - 1.0 K/uL Final   Eosinophils Relative 05/27/2021 3  % Final   Eosinophils Absolute 05/27/2021 0.3  0.0 - 0.5 K/uL Final   Basophils Relative 05/27/2021 1  % Final   Basophils Absolute 05/27/2021 0.1  0.0 - 0.1 K/uL Final   Immature Granulocytes 05/27/2021 0  % Final   Abs Immature Granulocytes 05/27/2021 0.04  0.00 - 0.07 K/uL Final   Performed at  [facility], [place], [place] [address]   Sodium 05/27/2021 135  135 - 145 mmol/L Final   Potassium 05/27/2021 4.5  3.5 - 5.1 mmol/L Final   Chloride 05/27/2021 100  98 - 111 mmol/L Final   CO2 05/27/2021 24  22 - 32 mmol/L Final   Glucose, Bld 05/27/2021 159 (A) 70 - 99 mg/dL Final   Glucose reference range applies only to samples taken after fasting for at least 8 hours.   BUN 05/27/2021 18  8 - 23 mg/dL Final   Creatinine, Ser 05/27/2021 1.21  0.61 - 1.24 mg/dL Final   Calcium 05/27/2021 10.5 (A) 8.9 - 10.3 mg/dL Final   Total Protein 05/27/2021 7.7  6.5 - 8.1 g/dL Final   Albumin 05/27/2021 4.0  3.5 - 5.0 g/dL Final   AST 05/27/2021 39  15 - 41 U/L Final   ALT 05/27/2021 31  0 - 44 U/L Final   Alkaline Phosphatase 05/27/2021 51  38 - 126 U/L Final   Total Bilirubin 05/27/2021 1.0  0.3 - 1.2 mg/dL Final   GFR, Estimated 05/27/2021 >60  >60 mL/min Final   Comment: (NOTE) Calculated using the CKD-EPI Creatinine Equation (2021)    Anion gap 05/27/2021 11  5 - 15 Final   Performed at [facility], [place], [place] [address]   Color, Urine 05/27/2021 YELLOW (A) YELLOW Final   APPearance 05/27/2021 CLEAR (A) CLEAR Final   Specific Gravity, Urine 05/27/2021 1.032 (A) 1.005 - 1.030 Final   pH 05/27/2021 5.0  5.0 - 8.0 Final   Glucose, UA 05/27/2021 NEGATIVE  NEGATIVE mg/dL Final   Hgb urine dipstick 05/27/2021 NEGATIVE  NEGATIVE Final   Bilirubin Urine 05/27/2021 NEGATIVE  NEGATIVE Final   Ketones, ur 05/27/2021 NEGATIVE  NEGATIVE mg/dL Final   Protein, ur 05/27/2021 NEGATIVE  NEGATIVE mg/dL Final   Nitrite 05/27/2021 NEGATIVE  NEGATIVE  Final   Leukocytes,Ua 05/27/2021 NEGATIVE  NEGATIVE Final   Performed at [facility], [address] [place], [place] [address]   ABO/RH(D) 05/27/2021 A POS   Final   Antibody Screen 05/27/2021 NEG   Final   Sample Expiration 05/27/2021 06/10/2021,2359   Final   Extend sample reason  05/27/2021    Final                   Value:NO TRANSFUSIONS OR PREGNANCY IN THE PAST 3 MONTHS Performed at [facility], [address] [place], [place] [address]    MRSA, PCR 05/27/2021 NEGATIVE  NEGATIVE Final   Staphylococcus aureus 05/27/2021 NEGATIVE  NEGATIVE Final   Comment: (NOTE) The Xpert SA Assay (FDA approved for NASAL specimens in patients 19 years of age and older), is one component of a comprehensive surveillance program. It is not intended to diagnose infection nor to guide or monitor treatment. Performed at [facility], [address] [place], [place] [address]     ECG: Date: 03/26/2021 Time ECG obtained: 1028 AM Rate: 93 bpm Rhythm:  Sinus rhythm with PVCs Axis (leads I and aVF): Normal Intervals: PR 198 ms. QRS 78 ms. QTc 435 ms. ST segment and T wave changes: No evidence of acute ST segment elevation or depression Comparison: Similar to previous tracing obtained on 09/20/2020   IMAGING / PROCEDURES: TRANSTHORACIC ECHOCARDIOGRAM performed on 12/14/2019 Left ventricular ejection fraction, by estimation, is 60 to 65%. The left ventricle has normal function. The left ventricle has no regional wall motion abnormalities. Left ventricular diastolic parameters are consistent with Grade I diastolic dysfunction (impaired relaxation).  Right ventricular systolic function is normal. The right ventricular size is normal. Tricuspid regurgitation signal is inadequate for assessing PA pressure.  LEFT HEART CATHETERIZATION AND CORONARY ANGIOGRAPHY performed on 09/05/2019 Two-vessel CAD 60% stenosis of the proximal LAD that is hemodynamically significant (iFR 0.87) 20% stenosis of the proximal to mid LCx 50% stenosis of the RPAV Normal left ventricular systolic function and filling pressure Successful PCI 2.75 x 18 mm Resolute Onyx DES x 1 placed to the proximal/mid LAD Recommendations DAPT therapy (ASA + clopidogrel) for at least 12  months Aggressive secondary prevention Medical management of the rPL disease  Impression and Plan:  Edward Schneider. has been referred for pre-anesthesia review and clearance prior to him undergoing the planned anesthetic and procedural courses. Available labs, pertinent testing, and imaging results were personally reviewed by me. This patient has been appropriately cleared by cardiology with an overall ACCEPTABLE risk of significant perioperative cardiovascular complications.  Based on clinical review performed today (05/28/21), barring any significant acute changes in the patient's overall condition, it is anticipated that he will be able to proceed with the planned surgical intervention. Any acute changes in clinical condition may necessitate his procedure being postponed and/or cancelled. Patient will meet with anesthesia team (MD and/or CRNA) on the day of his procedure for preoperative evaluation/assessment. Questions regarding anesthetic course will be fielded at that time.   Pre-surgical instructions were reviewed with the patient during his PAT appointment and questions were fielded by PAT clinical staff. Patient was advised that if any questions or concerns arise prior to his procedure then he should return a call to PAT and/or his surgeon's office to discuss.  Honor Loh, MSN, APRN, FNP-C, CEN [facility]  Peri-operative Services Nurse Practitioner Phone: [phone] Fax: [phone] 05/28/21 12:01 PM  NOTE: This note has been prepared using Dragon  dictation software. Despite my best ability to proofread, there is always the potential that unintentional transcriptional errors may still occur from this process.

## 2021-05-28 NOTE — Telephone Encounter (Signed)
I think it is reasonable for Edward Schneider to proceed with orthopedic surgery as planned without additional cardiac testing/intervention.  Unless he is high risk for significant bleeding complications, I recommend that aspirin 81 mg daily be continued during the perioperative period.  Nelva Bush, MD [facility]

## 2021-05-28 NOTE — Telephone Encounter (Signed)
   Primary Cardiologist: Nelva Bush, MD  Chart reviewed as part of pre-operative protocol coverage. Given past medical history and time since last visit, based on ACC/AHA guidelines, [place]. would be at acceptable risk for the planned procedure without further cardiovascular testing.   Per Dr. Saunders Revel:  "I think it is reasonable for Mr. Sitter to proceed with orthopedic surgery as planned without additional cardiac testing/intervention.  Unless he is high risk for significant bleeding complications, I recommend that aspirin 81 mg daily be continued during the perioperative period."  Patient was advised that if he develops new symptoms prior to surgery to contact our office to arrange a follow-up appointment.  He verbalized understanding.  I will route this recommendation to the requesting party via Epic fax function and remove from pre-op pool.  Please call with questions.  Jossie Ng. Katianne Barre NP-C    05/28/2021, 8:16 AM [address] [address] Office [phone] Fax [phone]

## 2021-05-30 ENCOUNTER — Other Ambulatory Visit
Admission: RE | Admit: 2021-05-30 | Discharge: 2021-05-30 | Disposition: A | Discharge status: Home / Self Care | Payer: Medicare Other | Source: Ambulatory Visit | Attending: Orthopedic Surgery | Admitting: Orthopedic Surgery

## 2021-05-30 ENCOUNTER — Other Ambulatory Visit: Payer: Self-pay

## 2021-05-30 DIAGNOSIS — Z01812 Encounter for preprocedural laboratory examination: Secondary | ICD-10-CM | POA: Diagnosis not present

## 2021-05-30 DIAGNOSIS — Z20822 Contact with and (suspected) exposure to covid-19: Secondary | ICD-10-CM | POA: Insufficient documentation

## 2021-05-30 LAB — SARS CORONAVIRUS 2 (TAT 6-24 HRS): SARS Coronavirus 2: NEGATIVE

## 2021-06-02 MED ORDER — CEFAZOLIN SODIUM-DEXTROSE 2-4 GM/100ML-% IV SOLN
2.0000 g | INTRAVENOUS | Status: AC
  Administered 2021-06-03: 2 g via INTRAVENOUS

## 2021-06-03 ENCOUNTER — Inpatient Hospital Stay: Payer: Medicare Other | Admitting: Urgent Care

## 2021-06-03 ENCOUNTER — Encounter: Payer: Self-pay | Admitting: Orthopedic Surgery

## 2021-06-03 ENCOUNTER — Inpatient Hospital Stay: Payer: Medicare Other

## 2021-06-03 ENCOUNTER — Other Ambulatory Visit: Payer: Self-pay

## 2021-06-03 ENCOUNTER — Encounter
Admission: RE | Disposition: A | Discharge status: Home Health | Payer: Self-pay | Source: Home / Self Care | Attending: Orthopedic Surgery

## 2021-06-03 ENCOUNTER — Observation Stay: Payer: Medicare Other

## 2021-06-03 ENCOUNTER — Inpatient Hospital Stay
Admission: RE | Admit: 2021-06-03 | Discharge: 2021-06-04 | DRG: 470 | Disposition: A | Discharge status: Home Health | Payer: Medicare Other | Attending: Orthopedic Surgery | Admitting: Orthopedic Surgery

## 2021-06-03 DIAGNOSIS — Z800 Family history of malignant neoplasm of digestive organs: Secondary | ICD-10-CM | POA: Diagnosis not present

## 2021-06-03 DIAGNOSIS — G8929 Other chronic pain: Secondary | ICD-10-CM | POA: Diagnosis present

## 2021-06-03 DIAGNOSIS — Z8249 Family history of ischemic heart disease and other diseases of the circulatory system: Secondary | ICD-10-CM

## 2021-06-03 DIAGNOSIS — Z841 Family history of disorders of kidney and ureter: Secondary | ICD-10-CM | POA: Diagnosis not present

## 2021-06-03 DIAGNOSIS — Z888 Allergy status to other drugs, medicaments and biological substances status: Secondary | ICD-10-CM | POA: Diagnosis not present

## 2021-06-03 DIAGNOSIS — Z91030 Bee allergy status: Secondary | ICD-10-CM | POA: Diagnosis not present

## 2021-06-03 DIAGNOSIS — Z823 Family history of stroke: Secondary | ICD-10-CM

## 2021-06-03 DIAGNOSIS — Z7984 Long term (current) use of oral hypoglycemic drugs: Secondary | ICD-10-CM

## 2021-06-03 DIAGNOSIS — M1611 Unilateral primary osteoarthritis, right hip: Secondary | ICD-10-CM | POA: Diagnosis not present

## 2021-06-03 DIAGNOSIS — Z79899 Other long term (current) drug therapy: Secondary | ICD-10-CM

## 2021-06-03 DIAGNOSIS — Z794 Long term (current) use of insulin: Secondary | ICD-10-CM

## 2021-06-03 DIAGNOSIS — E119 Type 2 diabetes mellitus without complications: Secondary | ICD-10-CM | POA: Diagnosis present

## 2021-06-03 DIAGNOSIS — Z6836 Body mass index (BMI) 36.0-36.9, adult: Secondary | ICD-10-CM

## 2021-06-03 DIAGNOSIS — Z885 Allergy status to narcotic agent status: Secondary | ICD-10-CM

## 2021-06-03 DIAGNOSIS — K219 Gastro-esophageal reflux disease without esophagitis: Secondary | ICD-10-CM | POA: Diagnosis not present

## 2021-06-03 DIAGNOSIS — E785 Hyperlipidemia, unspecified: Secondary | ICD-10-CM | POA: Diagnosis not present

## 2021-06-03 DIAGNOSIS — Z803 Family history of malignant neoplasm of breast: Secondary | ICD-10-CM | POA: Diagnosis not present

## 2021-06-03 DIAGNOSIS — I10 Essential (primary) hypertension: Secondary | ICD-10-CM | POA: Diagnosis not present

## 2021-06-03 DIAGNOSIS — Z96641 Presence of right artificial hip joint: Secondary | ICD-10-CM | POA: Diagnosis not present

## 2021-06-03 DIAGNOSIS — G8918 Other acute postprocedural pain: Secondary | ICD-10-CM

## 2021-06-03 DIAGNOSIS — Z91013 Allergy to seafood: Secondary | ICD-10-CM

## 2021-06-03 DIAGNOSIS — Z8261 Family history of arthritis: Secondary | ICD-10-CM | POA: Diagnosis not present

## 2021-06-03 DIAGNOSIS — Z419 Encounter for procedure for purposes other than remedying health state, unspecified: Secondary | ICD-10-CM

## 2021-06-03 DIAGNOSIS — Z471 Aftercare following joint replacement surgery: Secondary | ICD-10-CM | POA: Diagnosis not present

## 2021-06-03 DIAGNOSIS — Z833 Family history of diabetes mellitus: Secondary | ICD-10-CM | POA: Diagnosis not present

## 2021-06-03 HISTORY — DX: Migraine, unspecified, not intractable, without status migrainosus: G43.909

## 2021-06-03 HISTORY — DX: Atherosclerosis of aorta: I70.0

## 2021-06-03 HISTORY — DX: Type 2 diabetes mellitus without complications: E11.9

## 2021-06-03 HISTORY — DX: Other ill-defined heart diseases: I51.89

## 2021-06-03 HISTORY — DX: Fatty (change of) liver, not elsewhere classified: K76.0

## 2021-06-03 HISTORY — DX: Collagenous colitis: K52.831

## 2021-06-03 HISTORY — DX: Obstructive sleep apnea (adult) (pediatric): G47.33

## 2021-06-03 HISTORY — PX: TOTAL HIP ARTHROPLASTY: SHX124

## 2021-06-03 LAB — CREATININE, SERUM
Creatinine, Ser: 1.05 mg/dL (ref 0.61–1.24)
GFR, Estimated: >60 mL/min (ref >60)

## 2021-06-03 LAB — CBC
HCT: 32.9 % — ABNORMAL LOW (ref 39.0–52.0)
Hemoglobin: 11 g/dL — ABNORMAL LOW (ref 13.0–17.0)
MCH: 29.3 pg (ref 26.0–34.0)
MCHC: 33.4 g/dL (ref 30.0–36.0)
MCV: 87.7 fL (ref 80.0–100.0)
Platelets: 181 10*3/uL (ref 150–400)
RBC: 3.75 MIL/uL — ABNORMAL LOW (ref 4.22–5.81)
RDW: 14.5 % (ref 11.5–15.5)
WBC: 14.5 10*3/uL — ABNORMAL HIGH (ref 4.0–10.5)
nRBC: 0 % (ref 0.0–0.2)

## 2021-06-03 LAB — GLUCOSE, CAPILLARY
Glucose-Capillary: 146 mg/dL — ABNORMAL HIGH (ref 70–99)
Glucose-Capillary: 139 mg/dL — ABNORMAL HIGH (ref 70–99)
Glucose-Capillary: 128 mg/dL — ABNORMAL HIGH (ref 70–99)

## 2021-06-03 SURGERY — ARTHROPLASTY, HIP, TOTAL, ANTERIOR APPROACH
Anesthesia: Spinal | Site: Hip | Laterality: Right

## 2021-06-03 MED ORDER — DULOXETINE HCL 60 MG PO CPEP
60.0000 mg | ORAL_CAPSULE | Freq: Every day | ORAL | Status: DC
  Administered 2021-06-03: 60 mg via ORAL
  Filled 2021-06-03 (×2): qty 1

## 2021-06-03 MED ORDER — SODIUM CHLORIDE 0.9 % IV SOLN: INTRAVENOUS | Status: DC

## 2021-06-03 MED ORDER — PHENYLEPHRINE HCL (PRESSORS) 10 MG/ML IV SOLN
INTRAVENOUS | Status: DC | PRN
  Administered 2021-06-03 (×5): 100 ug via INTRAVENOUS
  Administered 2021-06-03: 200 ug via INTRAVENOUS

## 2021-06-03 MED ORDER — DULAGLUTIDE 1.5 MG/0.5ML ~~LOC~~ SOAJ: 1.5000 mg | SUBCUTANEOUS | Status: DC

## 2021-06-03 MED ORDER — INSULIN GLARGINE-YFGN 100 UNIT/ML ~~LOC~~ SOLN
32.0000 [IU] | Freq: Every day | SUBCUTANEOUS | Status: DC
  Administered 2021-06-03: 32 [IU] via SUBCUTANEOUS
  Filled 2021-06-03 (×3): qty 0.32

## 2021-06-03 MED ORDER — ALBUTEROL SULFATE HFA 108 (90 BASE) MCG/ACT IN AERS: 2.0000 | INHALATION_SPRAY | Freq: Four times a day (QID) | RESPIRATORY_TRACT | Status: DC | PRN

## 2021-06-03 MED ORDER — ARFORMOTEROL TARTRATE 15 MCG/2ML IN NEBU
15.0000 ug | INHALATION_SOLUTION | Freq: Two times a day (BID) | RESPIRATORY_TRACT | Status: DC
Start: 2021-06-03 — End: 2021-06-04
  Administered 2021-06-04: 15 ug via RESPIRATORY_TRACT
  Filled 2021-06-03 (×4): qty 2

## 2021-06-03 MED ORDER — ORAL CARE MOUTH RINSE: 15.0000 mL | Freq: Once | OROMUCOSAL | Status: AC

## 2021-06-03 MED ORDER — METHOCARBAMOL 500 MG PO TABS
500.0000 mg | ORAL_TABLET | Freq: Four times a day (QID) | ORAL | Status: DC | PRN
  Administered 2021-06-03: 500 mg via ORAL
  Filled 2021-06-03: qty 1

## 2021-06-03 MED ORDER — ADULT MULTIVITAMIN W/MINERALS CH
1.0000 | ORAL_TABLET | Freq: Every day | ORAL | Status: DC
  Administered 2021-06-04: 1 via ORAL
  Filled 2021-06-03: qty 1

## 2021-06-03 MED ORDER — TRAMADOL HCL 50 MG PO TABS
50.0000 mg | ORAL_TABLET | Freq: Four times a day (QID) | ORAL | Status: DC
Start: 2021-06-03 — End: 2021-06-04
  Administered 2021-06-03 – 2021-06-04 (×4): 50 mg via ORAL
  Filled 2021-06-03 (×4): qty 1

## 2021-06-03 MED ORDER — ONDANSETRON HCL 4 MG/2ML IJ SOLN: 4.0000 mg | Freq: Four times a day (QID) | INTRAMUSCULAR | Status: DC | PRN

## 2021-06-03 MED ORDER — NEOMYCIN-POLYMYXIN B GU 40-200000 IR SOLN
Status: DC | PRN
  Administered 2021-06-03: 4 mL

## 2021-06-03 MED ORDER — NEOMYCIN-POLYMYXIN B GU 40-200000 IR SOLN
Status: AC
  Filled 2021-06-03: qty 4

## 2021-06-03 MED ORDER — EZETIMIBE 10 MG PO TABS
10.0000 mg | ORAL_TABLET | Freq: Every day | ORAL | Status: DC
  Administered 2021-06-04: 10 mg via ORAL
  Filled 2021-06-03: qty 1

## 2021-06-03 MED ORDER — MIDAZOLAM HCL 2 MG/2ML IJ SOLN
INTRAMUSCULAR | Status: AC
  Filled 2021-06-03: qty 2

## 2021-06-03 MED ORDER — DULOXETINE HCL 60 MG PO CPEP
60.0000 mg | ORAL_CAPSULE | Freq: Every day | ORAL | Status: DC
  Administered 2021-06-04: 60 mg via ORAL
  Filled 2021-06-03: qty 1

## 2021-06-03 MED ORDER — DOCUSATE SODIUM 100 MG PO CAPS
100.0000 mg | ORAL_CAPSULE | Freq: Two times a day (BID) | ORAL | Status: DC
  Administered 2021-06-03 – 2021-06-04 (×2): 100 mg via ORAL
  Filled 2021-06-03 (×2): qty 1

## 2021-06-03 MED ORDER — BASAGLAR KWIKPEN 100 UNIT/ML ~~LOC~~ SOPN: 32.0000 [IU] | PEN_INJECTOR | Freq: Every day | SUBCUTANEOUS | Status: DC

## 2021-06-03 MED ORDER — ACETAMINOPHEN 10 MG/ML IV SOLN: 1000.0000 mg | Freq: Once | INTRAVENOUS | Status: DC | PRN

## 2021-06-03 MED ORDER — ALUM & MAG HYDROXIDE-SIMETH 200-200-20 MG/5ML PO SUSP: 30.0000 mL | ORAL | Status: DC | PRN

## 2021-06-03 MED ORDER — ACETAMINOPHEN 325 MG PO TABS: 325.0000 mg | ORAL_TABLET | Freq: Four times a day (QID) | ORAL | Status: DC | PRN

## 2021-06-03 MED ORDER — UMECLIDINIUM BROMIDE 62.5 MCG/INH IN AEPB
1.0000 | INHALATION_SPRAY | Freq: Every day | RESPIRATORY_TRACT | Status: DC
  Administered 2021-06-04: 1 via RESPIRATORY_TRACT
  Filled 2021-06-03: qty 7

## 2021-06-03 MED ORDER — METHOCARBAMOL 1000 MG/10ML IJ SOLN
500.0000 mg | Freq: Four times a day (QID) | INTRAVENOUS | Status: DC | PRN
  Filled 2021-06-03: qty 5

## 2021-06-03 MED ORDER — HYDROMORPHONE HCL 1 MG/ML IJ SOLN: 1.0000 mg | INTRAMUSCULAR | Status: DC | PRN

## 2021-06-03 MED ORDER — HYDROMORPHONE HCL 1 MG/ML IJ SOLN
0.5000 mg | INTRAMUSCULAR | Status: DC | PRN
  Administered 2021-06-03 – 2021-06-04 (×2): 1 mg via INTRAVENOUS
  Filled 2021-06-03 (×2): qty 1

## 2021-06-03 MED ORDER — ATORVASTATIN CALCIUM 20 MG PO TABS
80.0000 mg | ORAL_TABLET | Freq: Every day | ORAL | Status: DC
  Administered 2021-06-03: 80 mg via ORAL
  Filled 2021-06-03: qty 4

## 2021-06-03 MED ORDER — SENNOSIDES-DOCUSATE SODIUM 8.6-50 MG PO TABS: 1.0000 | ORAL_TABLET | Freq: Every evening | ORAL | Status: DC | PRN

## 2021-06-03 MED ORDER — ZOLPIDEM TARTRATE 5 MG PO TABS: 5.0000 mg | ORAL_TABLET | Freq: Every evening | ORAL | Status: DC | PRN

## 2021-06-03 MED ORDER — FLEET ENEMA 7-19 GM/118ML RE ENEM: 1.0000 | ENEMA | Freq: Once | RECTAL | Status: DC | PRN

## 2021-06-03 MED ORDER — SODIUM CHLORIDE (PF) 0.9 % IJ SOLN: INTRAMUSCULAR | Status: DC | PRN

## 2021-06-03 MED ORDER — ALBUTEROL SULFATE (2.5 MG/3ML) 0.083% IN NEBU: 2.5000 mg | INHALATION_SOLUTION | Freq: Four times a day (QID) | RESPIRATORY_TRACT | Status: DC | PRN

## 2021-06-03 MED ORDER — ONDANSETRON HCL 4 MG PO TABS: 4.0000 mg | ORAL_TABLET | Freq: Four times a day (QID) | ORAL | Status: DC | PRN

## 2021-06-03 MED ORDER — ASPIRIN EC 81 MG PO TBEC
81.0000 mg | DELAYED_RELEASE_TABLET | Freq: Every day | ORAL | Status: DC
  Administered 2021-06-03 – 2021-06-04 (×2): 81 mg via ORAL
  Filled 2021-06-03 (×2): qty 1

## 2021-06-03 MED ORDER — NITROGLYCERIN 0.4 MG SL SUBL: 0.4000 mg | SUBLINGUAL_TABLET | SUBLINGUAL | Status: DC | PRN

## 2021-06-03 MED ORDER — RIMEGEPANT SULFATE 75 MG PO TBDP: 75.0000 mg | ORAL_TABLET | Freq: Every day | ORAL | Status: DC | PRN

## 2021-06-03 MED ORDER — ONDANSETRON HCL 4 MG/2ML IJ SOLN: 4.0000 mg | Freq: Once | INTRAMUSCULAR | Status: DC | PRN

## 2021-06-03 MED ORDER — PROPOFOL 10 MG/ML IV BOLUS
INTRAVENOUS | Status: DC | PRN
  Administered 2021-06-03: 75 ug/kg/min via INTRAVENOUS
  Administered 2021-06-03 (×2): 30 mg via INTRAVENOUS

## 2021-06-03 MED ORDER — SODIUM CHLORIDE 0.9 % IV SOLN
INTRAVENOUS | Status: DC | PRN
  Administered 2021-06-03: 30 ug/min via INTRAVENOUS

## 2021-06-03 MED ORDER — INSULIN ASPART 100 UNIT/ML IJ SOLN
16.0000 [IU] | Freq: Three times a day (TID) | INTRAMUSCULAR | Status: DC
  Administered 2021-06-03 – 2021-06-04 (×3): 16 [IU] via SUBCUTANEOUS
  Filled 2021-06-03 (×3): qty 1

## 2021-06-03 MED ORDER — OXYCODONE HCL 5 MG PO TABS
5.0000 mg | ORAL_TABLET | Freq: Once | ORAL | Status: DC | PRN
Start: 2021-06-03 — End: 2021-06-03

## 2021-06-03 MED ORDER — BISACODYL 10 MG RE SUPP: 10.0000 mg | Freq: Every day | RECTAL | Status: DC | PRN

## 2021-06-03 MED ORDER — LIDOCAINE HCL (PF) 2 % IJ SOLN
INTRAMUSCULAR | Status: AC
  Filled 2021-06-03: qty 5

## 2021-06-03 MED ORDER — BENAZEPRIL HCL 10 MG PO TABS
10.0000 mg | ORAL_TABLET | Freq: Every day | ORAL | Status: DC
  Administered 2021-06-04: 10 mg via ORAL
  Filled 2021-06-03: qty 1

## 2021-06-03 MED ORDER — INSULIN LISPRO 200 UNIT/ML ~~LOC~~ SOPN: 16.0000 [IU] | PEN_INJECTOR | Freq: Three times a day (TID) | SUBCUTANEOUS | Status: DC

## 2021-06-03 MED ORDER — FAMOTIDINE 20 MG PO TABS: 20.0000 mg | ORAL_TABLET | Freq: Once | ORAL | Status: AC

## 2021-06-03 MED ORDER — LORATADINE 10 MG PO TABS
10.0000 mg | ORAL_TABLET | Freq: Every day | ORAL | Status: DC
  Administered 2021-06-04: 10 mg via ORAL
  Filled 2021-06-03: qty 1

## 2021-06-03 MED ORDER — INSULIN ASPART 100 UNIT/ML IJ SOLN
0.0000 [IU] | Freq: Three times a day (TID) | INTRAMUSCULAR | Status: DC
  Administered 2021-06-03: 2 [IU] via SUBCUTANEOUS
  Administered 2021-06-04 (×2): 3 [IU] via SUBCUTANEOUS
  Filled 2021-06-03 (×3): qty 1

## 2021-06-03 MED ORDER — CHLORHEXIDINE GLUCONATE 0.12 % MT SOLN
15.0000 mL | Freq: Once | OROMUCOSAL | Status: AC
Start: 2021-06-03 — End: 2021-06-03

## 2021-06-03 MED ORDER — BUPIVACAINE HCL (PF) 0.5 % IJ SOLN
INTRAMUSCULAR | Status: AC
  Filled 2021-06-03: qty 10

## 2021-06-03 MED ORDER — BUPIVACAINE HCL (PF) 0.5 % IJ SOLN
INTRAMUSCULAR | Status: DC | PRN
  Administered 2021-06-03: 2.5 mL via INTRATHECAL

## 2021-06-03 MED ORDER — CEFAZOLIN SODIUM-DEXTROSE 2-4 GM/100ML-% IV SOLN
2.0000 g | Freq: Four times a day (QID) | INTRAVENOUS | Status: AC
  Administered 2021-06-03 (×2): 2 g via INTRAVENOUS
  Filled 2021-06-03 (×2): qty 100

## 2021-06-03 MED ORDER — DAPAGLIFLOZIN PROPANEDIOL 5 MG PO TABS
5.0000 mg | ORAL_TABLET | Freq: Every day | ORAL | Status: DC
  Administered 2021-06-04: 5 mg via ORAL
  Filled 2021-06-03: qty 1

## 2021-06-03 MED ORDER — PREGABALIN 50 MG PO CAPS
50.0000 mg | ORAL_CAPSULE | Freq: Three times a day (TID) | ORAL | Status: DC
  Administered 2021-06-03 – 2021-06-04 (×4): 50 mg via ORAL
  Filled 2021-06-03 (×4): qty 1

## 2021-06-03 MED ORDER — FENTANYL CITRATE (PF) 100 MCG/2ML IJ SOLN: 25.0000 ug | INTRAMUSCULAR | Status: DC | PRN

## 2021-06-03 MED ORDER — 0.9 % SODIUM CHLORIDE (POUR BTL) OPTIME
TOPICAL | Status: DC | PRN
  Administered 2021-06-03: 1000 mL

## 2021-06-03 MED ORDER — CHLORHEXIDINE GLUCONATE 0.12 % MT SOLN
OROMUCOSAL | Status: AC
  Administered 2021-06-03: 15 mL via OROMUCOSAL
  Filled 2021-06-03: qty 15

## 2021-06-03 MED ORDER — FAMOTIDINE 20 MG PO TABS
ORAL_TABLET | ORAL | Status: AC
  Administered 2021-06-03: 20 mg via ORAL
  Filled 2021-06-03: qty 1

## 2021-06-03 MED ORDER — BUPIVACAINE LIPOSOME 1.3 % IJ SUSP
INTRAMUSCULAR | Status: AC
  Filled 2021-06-03: qty 20

## 2021-06-03 MED ORDER — SODIUM CHLORIDE FLUSH 0.9 % IV SOLN
INTRAVENOUS | Status: AC
  Filled 2021-06-03: qty 40

## 2021-06-03 MED ORDER — TRAZODONE HCL 100 MG PO TABS
100.0000 mg | ORAL_TABLET | Freq: Every evening | ORAL | Status: DC | PRN
  Administered 2021-06-03: 100 mg via ORAL
  Filled 2021-06-03: qty 1

## 2021-06-03 MED ORDER — DOXEPIN HCL 50 MG PO CAPS
50.0000 mg | ORAL_CAPSULE | Freq: Every day | ORAL | Status: DC
  Administered 2021-06-03: 50 mg via ORAL
  Filled 2021-06-03 (×2): qty 1

## 2021-06-03 MED ORDER — MENTHOL 3 MG MT LOZG
1.0000 | LOZENGE | OROMUCOSAL | Status: DC | PRN
  Filled 2021-06-03: qty 9

## 2021-06-03 MED ORDER — DIPHENHYDRAMINE HCL 25 MG PO CAPS
25.0000 mg | ORAL_CAPSULE | Freq: Two times a day (BID) | ORAL | Status: DC
  Administered 2021-06-03 – 2021-06-04 (×2): 25 mg via ORAL
  Filled 2021-06-03 (×2): qty 1

## 2021-06-03 MED ORDER — OXYCODONE HCL ER 10 MG PO T12A
20.0000 mg | EXTENDED_RELEASE_TABLET | Freq: Three times a day (TID) | ORAL | Status: DC
Start: 2021-06-03 — End: 2021-06-04
  Administered 2021-06-03 – 2021-06-04 (×4): 20 mg via ORAL
  Filled 2021-06-03 (×4): qty 2

## 2021-06-03 MED ORDER — OXYCODONE HCL 5 MG PO TABS
10.0000 mg | ORAL_TABLET | ORAL | Status: DC | PRN
  Administered 2021-06-03 – 2021-06-04 (×3): 15 mg via ORAL
  Filled 2021-06-03 (×3): qty 3

## 2021-06-03 MED ORDER — BUPIVACAINE-EPINEPHRINE (PF) 0.25% -1:200000 IJ SOLN
INTRAMUSCULAR | Status: AC
  Filled 2021-06-03: qty 30

## 2021-06-03 MED ORDER — CEFAZOLIN SODIUM-DEXTROSE 2-4 GM/100ML-% IV SOLN
INTRAVENOUS | Status: AC
  Filled 2021-06-03: qty 100

## 2021-06-03 MED ORDER — OXYCODONE HCL 5 MG PO TABS: 5.0000 mg | ORAL_TABLET | ORAL | Status: DC | PRN

## 2021-06-03 MED ORDER — OXYCODONE HCL 5 MG/5ML PO SOLN
5.0000 mg | Freq: Once | ORAL | Status: DC | PRN
Start: 2021-06-03 — End: 2021-06-03

## 2021-06-03 MED ORDER — ISOSORBIDE MONONITRATE ER 30 MG PO TB24
60.0000 mg | ORAL_TABLET | Freq: Every day | ORAL | Status: DC
  Administered 2021-06-04: 60 mg via ORAL
  Filled 2021-06-03: qty 2

## 2021-06-03 MED ORDER — ASPIRIN-ACETAMINOPHEN-CAFFEINE 250-250-65 MG PO TABS
2.0000 | ORAL_TABLET | Freq: Once | ORAL | Status: DC | PRN
  Filled 2021-06-03: qty 2

## 2021-06-03 MED ORDER — SODIUM CHLORIDE 0.9 % IV SOLN
INTRAVENOUS | Status: AC | PRN
  Administered 2021-06-03: 250 mL

## 2021-06-03 MED ORDER — ENOXAPARIN SODIUM 40 MG/0.4ML IJ SOSY
40.0000 mg | PREFILLED_SYRINGE | INTRAMUSCULAR | Status: DC
  Administered 2021-06-04: 40 mg via SUBCUTANEOUS
  Filled 2021-06-03: qty 0.4

## 2021-06-03 MED ORDER — MIDAZOLAM HCL 5 MG/5ML IJ SOLN
INTRAMUSCULAR | Status: DC | PRN
  Administered 2021-06-03: 2 mg via INTRAVENOUS

## 2021-06-03 MED ORDER — SUCRALFATE 1 G PO TABS
1.0000 g | ORAL_TABLET | Freq: Three times a day (TID) | ORAL | Status: DC
  Administered 2021-06-03 – 2021-06-04 (×5): 1 g via ORAL
  Filled 2021-06-03 (×5): qty 1

## 2021-06-03 MED ORDER — PHENOL 1.4 % MT LIQD
1.0000 | OROMUCOSAL | Status: DC | PRN
  Filled 2021-06-03: qty 177

## 2021-06-03 MED ORDER — LIDOCAINE HCL (CARDIAC) PF 100 MG/5ML IV SOSY
PREFILLED_SYRINGE | INTRAVENOUS | Status: DC | PRN
  Administered 2021-06-03: 40 mg via INTRAVENOUS

## 2021-06-03 SURGICAL SUPPLY — 67 items
APL PRP STRL LF DISP 70% ISPRP (MISCELLANEOUS) ×1
BLADE SAGITTAL AGGR TOOTH XLG (BLADE) ×2 IMPLANT
BNDG COHESIVE 6X5 TAN ST LF (GAUZE/BANDAGES/DRESSINGS) ×6 IMPLANT
CANISTER SUCT 1200ML W/VALVE (MISCELLANEOUS) ×2 IMPLANT
CANISTER WOUND CARE 500ML ATS (WOUND CARE) ×2 IMPLANT
CHLORAPREP W/TINT 26 (MISCELLANEOUS) ×2 IMPLANT
COVER BACK TABLE REUSABLE LG (DRAPES) ×2 IMPLANT
DRAPE 3/4 80X56 (DRAPES) ×6 IMPLANT
DRAPE C-ARM XRAY 36X54 (DRAPES) ×2 IMPLANT
DRAPE INCISE IOBAN 66X60 STRL (DRAPES) ×1 IMPLANT
DRAPE POUCH INSTRU U-SHP 10X18 (DRAPES) ×2 IMPLANT
DRESSING SURGICEL FIBRLLR 1X2 (HEMOSTASIS) ×2 IMPLANT
DRSG MEPILEX SACRM 8.7X9.8 (GAUZE/BANDAGES/DRESSINGS) ×2 IMPLANT
DRSG OPSITE POSTOP 4X8 (GAUZE/BANDAGES/DRESSINGS) ×4 IMPLANT
DRSG SURGICEL FIBRILLAR 1X2 (HEMOSTASIS) ×4
ELECT BLADE 6.5 EXT (BLADE) ×2 IMPLANT
ELECT REM PT RETURN 9FT ADLT (ELECTROSURGICAL) ×2
ELECTRODE REM PT RTRN 9FT ADLT (ELECTROSURGICAL) ×1 IMPLANT
GAUZE 4X4 16PLY ~~LOC~~+RFID DBL (SPONGE) ×2 IMPLANT
GLOVE SURG SYN 9.0  PF PI (GLOVE) ×4
GLOVE SURG SYN 9.0 PF PI (GLOVE) ×2 IMPLANT
GLOVE SURG UNDER POLY LF SZ9 (GLOVE) ×2 IMPLANT
GOWN SRG 2XL LVL 4 RGLN SLV (GOWNS) ×1 IMPLANT
GOWN STRL NON-REIN 2XL LVL4 (GOWNS) ×2
GOWN STRL REUS W/ TWL LRG LVL3 (GOWN DISPOSABLE) ×1 IMPLANT
GOWN STRL REUS W/TWL LRG LVL3 (GOWN DISPOSABLE) ×2
HEMOVAC 400CC 10FR (MISCELLANEOUS) IMPLANT
HIP DBL LINER 54X28 (Liner) ×1 IMPLANT
HIP FEM HD S 28 (Head) ×1 IMPLANT
HOLDER FOLEY CATH W/STRAP (MISCELLANEOUS) ×2 IMPLANT
HOOD PEEL AWAY FLYTE STAYCOOL (MISCELLANEOUS) ×2 IMPLANT
IRRIGATION SURGIPHOR STRL (IV SOLUTION) IMPLANT
IV NS 250ML (IV SOLUTION) ×2
IV NS 250ML BAXH (IV SOLUTION) IMPLANT
KIT PREVENA INCISION MGT 13 (CANNISTER) ×2 IMPLANT
MANIFOLD NEPTUNE II (INSTRUMENTS) ×2 IMPLANT
MAT ABSORB  FLUID 56X50 GRAY (MISCELLANEOUS) ×2
MAT ABSORB FLUID 56X50 GRAY (MISCELLANEOUS) ×1 IMPLANT
NDL SAFETY ECLIPSE 18X1.5 (NEEDLE) ×1 IMPLANT
NDL SPNL 20GX3.5 QUINCKE YW (NEEDLE) ×2 IMPLANT
NEEDLE HYPO 18GX1.5 SHARP (NEEDLE) ×2
NEEDLE SPNL 20GX3.5 QUINCKE YW (NEEDLE) ×4 IMPLANT
NS IRRIG 1000ML POUR BTL (IV SOLUTION) ×2 IMPLANT
PACK HIP COMPR (MISCELLANEOUS) ×2 IMPLANT
SCALPEL PROTECTED #10 DISP (BLADE) ×4 IMPLANT
SHELL ACETABULAR SZ 54 DM (Shell) ×1 IMPLANT
SOL PREP PVP 2OZ (MISCELLANEOUS) ×2
SOLUTION PREP PVP 2OZ (MISCELLANEOUS) ×1 IMPLANT
SPONGE DRAIN TRACH 4X4 STRL 2S (GAUZE/BANDAGES/DRESSINGS) ×2 IMPLANT
SPONGE T-LAP 18X18 ~~LOC~~+RFID (SPONGE) ×4 IMPLANT
STAPLER SKIN PROX 35W (STAPLE) ×2 IMPLANT
STEM FEMORAL SZ3  STD COLLARED (Stem) ×1 IMPLANT
STRAP SAFETY 5IN WIDE (MISCELLANEOUS) ×2 IMPLANT
SUT DVC 2 QUILL PDO  T11 36X36 (SUTURE) ×2
SUT DVC 2 QUILL PDO T11 36X36 (SUTURE) ×1 IMPLANT
SUT SILK 0 (SUTURE) ×2
SUT SILK 0 30XBRD TIE 6 (SUTURE) ×1 IMPLANT
SUT V-LOC 90 ABS DVC 3-0 CL (SUTURE) ×2 IMPLANT
SUT VIC AB 1 CT1 36 (SUTURE) ×2 IMPLANT
SYR 20ML LL LF (SYRINGE) ×2 IMPLANT
SYR 30ML LL (SYRINGE) ×2 IMPLANT
SYR 50ML LL SCALE MARK (SYRINGE) ×4 IMPLANT
SYR BULB IRRIG 60ML STRL (SYRINGE) ×2 IMPLANT
TAPE MICROFOAM 4IN (TAPE) ×2 IMPLANT
TOWEL OR 17X26 4PK STRL BLUE (TOWEL DISPOSABLE) ×2 IMPLANT
TRAY FOLEY MTR SLVR 16FR STAT (SET/KITS/TRAYS/PACK) ×2 IMPLANT
WAND WEREWOLF FASTSEAL 6.0 (MISCELLANEOUS) ×1 IMPLANT

## 2021-06-03 NOTE — Transfer of Care (Signed)
Immediate Anesthesia Transfer of Care Note  Patient: Edward Schneider.  Procedure(s) Performed: TOTAL HIP ARTHROPLASTY ANTERIOR APPROACH (Right: Hip)  Patient Location: PACU  Anesthesia Type:Spinal  Level of Consciousness: awake, alert  and oriented  Airway & Oxygen Therapy: Patient Spontanous Breathing and Patient connected to face mask oxygen  Post-op Assessment: Report given to RN and Post -op Vital signs reviewed and stable  Post vital signs: Reviewed and stable  Last Vitals:  Vitals Value Taken Time  BP 96/56 06/03/21 1408  Temp    Pulse 73 06/03/21 1412  Resp 14 06/03/21 1412  SpO2 98 % 06/03/21 1412  Vitals shown include unvalidated device data.  Last Pain:  Vitals:   06/03/21 1026  TempSrc: Oral  PainSc: 6          Complications: No notable events documented.

## 2021-06-03 NOTE — H&P (Signed)
Chief Complaint  Patient presents with   Pre-op Exam  Right THA scheduled 06/03/21    History of the Present Illness: Edward Schneider. is a 65 y.o. male here today for history and physical for right total hip arthroplasty with Dr. Hessie Knows on 06/03/2021. Patient has had years of severe right hip pain that has progressively increased to the point where he has a hard time walking greater than 100 feet. When he goes to the grocery store he has to use a motorized cart. He has chronic pain from multiple other joints in his back. He sees the pain clinic and takes oxycodone 15 mg every 4 hours. He has x-rays showing advanced right hip arthritis. He has pain in his groin, thigh, buttocks that is increased with any type of weightbearing activity. Denies any radicular symptoms. Patient is diabetic and has been working on his diet. He has been also working on losing weight and has been successful.  I have reviewed past medical, surgical, social and family history, and allergies as documented in the EMR.  Past Medical History: Past Medical History:  Diagnosis Date   Abnormal CT of the abdomen 12/24/2014   Anaphylactic reaction  x 3, d/t Bees   Chronic back pain   Collagenous colitis 12/24/2014   COPD (chronic obstructive pulmonary disease) (CMS-HCC)   Fatty liver disease, nonalcoholic 3/0/1601   Hyperlipidemia   Hypertension   Left upper quadrant pain 12/24/2014   Lumbar degenerative disc disease   Pancreatic insufficiency 01/28/2015   Past Surgical History: Past Surgical History:  Procedure Laterality Date   COLONOSCOPY 08/01/2012  Hyperplastic polyp - repeat 10 Years   COLONOSCOPY 06/27/2002  Denyse Amass   Past Family History: Family History  Problem Relation Age of Onset   Cancer Daughter   Breast cancer Daughter   No Known Problems Daughter   No Known Problems Son   Cancer Grandchild   Diabetes type II Mother   Heart disease Mother   High blood pressure (Hypertension) Mother    High blood pressure (Hypertension) Father   Diabetes type II Sister   Heart disease Sister   Breast cancer Sister   Stomach cancer Brother   Stomach cancer Maternal Grandfather   Stroke Brother   No Known Problems Maternal Aunt   No Known Problems Maternal Uncle   No Known Problems Paternal Aunt   No Known Problems Paternal Uncle   No Known Problems Maternal Grandmother   No Known Problems Paternal Grandmother   No Known Problems Paternal Grandfather   Celiac disease Neg Hx   Cirrhosis Neg Hx   Colon cancer Neg Hx   Colon polyps Neg Hx   Crohn's disease Neg Hx   Cystic fibrosis Neg Hx   Esophageal cancer Neg Hx   Hemochromatosis Neg Hx   Inflammatory bowel disease Neg Hx   Irritable bowel syndrome Neg Hx   Liver cancer Neg Hx   Liver disease Neg Hx   Rectal cancer Neg Hx   Ulcerative colitis Neg Hx   Wilson's disease Neg Hx   Medications: Current Outpatient Medications Ordered in Epic  Medication Sig Dispense Refill   ASCORBATE CALCIUM (VITAMIN C ORAL) Take by mouth once daily.   ascorbic acid, vitamin C, (VITAMIN C) 1000 MG tablet Take 1 tablet by mouth once daily   aspirin 81 MG EC tablet Take 81 mg by mouth once daily.   atorvastatin (LIPITOR) 80 MG tablet Take 1 tablet by mouth once daily   benazepriL (LOTENSIN)  20 MG tablet Take 1 tablet by mouth once daily   calcium carbonate 600 mg calcium (1,500 mg) Tab tablet Take 1 tablet by mouth once daily   cephalexin (KEFLEX) 500 MG capsule Take 500 mg by mouth 2 (two) times daily   cetirizine (ZYRTEC) 10 MG tablet Take 1 tablet by mouth once daily   cholecalciferol, vitamin D3, 25 mcg (1,000 unit) Chew Take 1 capsule by mouth once daily   ciprofloxacin HCl (CIPRO) 500 MG tablet Take by mouth.   clopidogreL (PLAVIX) 75 mg tablet Take 1 tablet by mouth once daily   cyanocobalamin (VITAMIN B12) 1000 MCG tablet Take by mouth once daily.   diclofenac (VOLTAREN) 1 % topical gel Apply 2 g topically 4 (four) times daily    docosahexaenoic acid-epa 120-180 mg Cap Take 1 capsule by mouth once daily   doxepin (SINEQUAN) 25 MG capsule Take 1 capsule by mouth nightly   DULoxetine (CYMBALTA) 30 MG DR capsule Take 1 capsule by mouth 2 (two) times daily   EPINEPHrine (EPIPEN JR) 0.15 mg/0.3 mL (1:2,000) pen injector Inject 0.15 mg into the muscle once as needed for Anaphylaxis.   FREESTYLE LIBRE 14 DAY SENSOR kit (Patient not taking: Reported on 09/04/2020 )   ginseng 520 mg Cap Take 500 mg by mouth once daily.   insulin DEGLUDEC (TRESIBA FLEXTOUCH U-100) pen injector (concentration 100 units/mL) Inject 28 Units subcutaneously once daily   insulin LISPRO (HUMALOG KWIKPEN) pen injector (concentration 100 units/mL) Inject 18 Units subcutaneously daily with dinner   isosorbide mononitrate (IMDUR) 60 MG ER tablet Take 1.5 tablets by mouth once daily   metroNIDAZOLE (FLAGYL) 500 MG tablet Take by mouth.   naloxone (NARCAN) 4 mg/actuation nasal spray 0.4 mg by Nasal route once   nitroGLYcerin (NITROSTAT) 0.4 MG SL tablet Place 1 tablet under the tongue as directed   OMEGA-3/DHA/EPA/FISH OIL (FISH OIL-OMEGA-3 FATTY ACIDS) 300-1,000 mg capsule Take by mouth once daily.   oxyCODONE (ROXICODONE) 15 MG immediate release tablet Take 15 mg by mouth every 4 (four) hours as needed for Pain   pantoprazole (PROTONIX) 40 MG DR tablet Take 1 tablet by mouth 2 (two) times daily   pregabalin (LYRICA) 50 MG capsule Take 1 capsule by mouth 3 (three) times daily   semaglutide (OZEMPIC) 0.25 mg or 0.5 mg(2 mg/1.5 mL) pen injector Inject 0.5 mg subcutaneously once a week   sucralfate (CARAFATE) 1 gram tablet Take 1 tablet by mouth 4 (four) times daily   SUMAtriptan (IMITREX) 100 MG tablet Take 1 tablet by mouth once daily as needed.   tiotropium-olodateroL (STIOLTO RESPIMAT) 2.5-2.5 mcg/actuation inhaler Inhale 2 inhalations into the lungs once daily   traZODone (DESYREL) 100 MG tablet Take 1 tablet by mouth as needed   vitamin A 10000 UNIT  capsule Take 1 capsule by mouth once daily   vitamin E acid succinate (VITAMIN E SUCCINATE) 268 mg (400 unit) Tab Take by mouth once daily   No current Epic-ordered facility-administered medications on file.   Allergies: Allergies  Allergen Reactions   Bee Pollens Anaphylaxis  Died 3 times when stung by bees. Carries Epi-pen at all times.   Shellfish Containing Products Swelling  Shrimp causes throat to swell and tingling in tongue. Can eat other white fish, crabcakes, and oysters.   Shrimp Anaphylaxis   Venom-Honey Bee Anaphylaxis   Fentanyl Hives  Patch   Gabapentin Diarrhea  Severe diarrhea which caused incontinence, loss of appetite and weight loss.   Morphine Itching   Simvastatin Diarrhea  and Muscle Pain   Buprenorphine Hcl Itching    Body mass index is 37.75 kg/m.  Review of Systems: A comprehensive 14 point ROS was performed, reviewed, and the pertinent orthopaedic findings are documented in the HPI.  Vitals:  05/28/21 1531  BP: 118/62    General Physical Examination:  General:  Well developed, well nourished, no apparent distress, normal affect, antalgic gait with a cane  HEENT: Head normocephalic, atraumatic, PERRL.   Abdomen: Soft, non tender, non distended, Bowel sounds present.  Heart: Examination of the heart reveals regular, rate, and rhythm. There is no murmur noted on ascultation. There is a normal apical pulse.  Lungs: Lungs are clear to auscultation. There is no wheeze, rhonchi, or crackles. There is normal expansion of bilateral chest walls.   Musculoskeletal Examination: On exam Right hip internal rotation is 20 degrees, external is 20 degrees with severe pain in groin. No swelling or edema RLE. Ankle plantar flexion and dorsiflexion intact  Radiographs:  AP pelvis and lateral x-rays of the right hip were reviewed by me today. These show severe central hip osteoarthritis with a large inferior osteophyte on the acetabulum. This is symmetric  to the other hip.  X-ray Impression Severe central hip osteoarthritis, normal appearing superior dome.  Assessment: ICD-10-CM  1. Primary localized osteoarthritis of right hip M16.11   Plan:  110. 65 year old male with severe right hip osteoarthritis. Symptoms present for 3 years and interfering with quality of life and activities daily living. He has a hard time ambulating distances greater than 100 feet due to severe pain and limping. Risks, benefits, complications of a right total hip arthroplasty have been discussed with the patient. Patient has agreed and consented procedure with Dr. Hessie Knows on 06/03/2021.   Electronically signed by Feliberto Gottron, [place] at 05/28/2021 3:46 PM EDT  Reviewed  H+P. No changes noted.

## 2021-06-03 NOTE — Anesthesia Procedure Notes (Signed)
Spinal  Patient location during procedure: OR Start time: 06/03/2021 12:00 PM End time: 06/03/2021 12:07 PM Reason for block: surgical anesthesia Staffing Performed: anesthesiologist  Anesthesiologist: Arita Miss, MD Preanesthetic Checklist Completed: patient identified, IV checked, site marked, risks and benefits discussed, surgical consent, monitors and equipment checked, pre-op evaluation and timeout performed Spinal Block Patient position: sitting Prep: ChloraPrep Patient monitoring: heart rate, continuous pulse ox, blood pressure and cardiac monitor Approach: midline Location: L3-4 Injection technique: single-shot Needle Needle type: Quincke  Needle gauge: 22 G Needle length: 9 cm Assessment Sensory level: T10 Events: CSF return Additional Notes Negative paresthesia. Negative blood return. Positive free-flowing CSF. Expiration date of kit checked and confirmed. Patient tolerated procedure well, without complications.

## 2021-06-03 NOTE — Op Note (Signed)
06/03/2021  2:15 PM  PATIENT:  Edward Schneider.  65 y.o. male  PRE-OPERATIVE DIAGNOSIS:  Primary localized osteoarthritis of right hip M16.11  POST-OPERATIVE DIAGNOSIS:  Primary localized osteoarthritis of right hip M16.11  PROCEDURE:  Procedure(s): TOTAL HIP ARTHROPLASTY ANTERIOR APPROACH (Right)  SURGEON: Laurene Footman, MD  ASSISTANTS: None  ANESTHESIA:   spinal  EBL:  Total I/O In: 850 [I.V.:850] Out: 450 [Urine:150; Blood:300]  BLOOD ADMINISTERED:none  DRAINS:  Incisional wound VAC    LOCAL MEDICATIONS USED: Marcaine and Exparel  SPECIMEN: Right femoral head  DISPOSITION OF SPECIMEN:  PATHOLOGY  COUNTS:  YES  TOURNIQUET:  * No tourniquets in log *  IMPLANTS: Medacta AMIS 3 standard stem, Mpact DM 54 mm cup with liner and metal S 28 mm head  DICTATION: .Dragon Dictation   The patient was brought to the operating room and after spinal anesthesia was obtained patient was placed on the operative table with the ipsilateral foot into the Medacta attachment, contralateral leg on a well-padded table. C-arm was brought in and preop template x-ray taken. After prepping and draping in usual sterile fashion appropriate patient identification and timeout procedures were completed. Anterior approach to the hip was obtained and centered over the greater trochanter and TFL muscle. The subcutaneous tissue was incised hemostasis being achieved by electrocautery. TFL fascia was incised and the muscle retracted laterally deep retractor placed. The lateral femoral circumflex vessels were identified and ligated. The anterior capsule was exposed and a capsulotomy performed. The neck was identified and a femoral neck cut carried out with a saw. The head was removed without difficulty and showed sclerotic femoral head and acetabulum. Reaming was carried out to 54 mm and a 54 mm cup trial gave appropriate tightness to the acetabular component a 54 DM cup was impacted into position. The leg was then  externally rotated and ischiofemoral and pubofemoral releases carried out. The femur was sequentially broached to a size 3, size 3 standard with S head trials were placed and the final components chosen. The 3 standard stem was inserted along with a metal S 28 mm head and 54 mm liner. The hip was reduced and was stable the wound was thoroughly irrigated with fibrillar placed along the posterior capsule and medial neck. The deep fascia ws closed using a heavy Quill after infiltration of 30 cc of quarter percent Sensorcaine with epinephrine diluted with Exparel throughout the case .3-0 V-loc to close the skin with skin staples.  Incisional wound VAC applied and patient was sent to recovery in stable condition.   PLAN OF CARE: Admit for overnight observation

## 2021-06-03 NOTE — Anesthesia Preprocedure Evaluation (Addendum)
Anesthesia Evaluation  Patient identified by MRN, date of birth, ID band Patient awake    Reviewed: Allergy & Precautions, NPO status , Patient's Chart, lab work & pertinent test results  History of Anesthesia Complications Negative for: history of anesthetic complications  Airway Mallampati: III  TM Distance: >3 FB Neck ROM: Limited    Dental  (+) Edentulous Upper, Edentulous Lower   Pulmonary sleep apnea and Continuous Positive Airway Pressure Ventilation , neg COPD, Patient abstained from smoking.Not current smoker, former smoker,    Pulmonary exam normal breath sounds clear to auscultation       Cardiovascular Exercise Tolerance: Good METShypertension, + CAD, + Past MI, + Cardiac Stents and + Peripheral Vascular Disease  (-) dysrhythmias  Rhythm:Regular Rate:Normal - Systolic murmurs TRANSTHORACIC ECHOCARDIOGRAM performed on 12/14/2019 1. Left ventricular ejection fraction, by estimation, is 60 to 65%. The left ventricle has normal function. The left ventricle has no regional wall motion abnormalities. Left ventricular diastolic parameters are consistent with Grade I diastolic dysfunction (impaired relaxation).  2. Right ventricular systolic function is normal. The right ventricular size is normal. Tricuspid regurgitation signal is inadequate for assessing PA pressure.   Neuro/Psych  Headaches, PSYCHIATRIC DISORDERS Depression  Neuromuscular disease    GI/Hepatic neg GERD  ,(+)     (-) substance abuse  ,   Endo/Other  diabetes, Well Controlled, Type 2, Insulin Dependent  Renal/GU negative Renal ROS     Musculoskeletal Chronic pain, on '90mg'$  oxycodone per day   Abdominal (+) + obese,   Peds  Hematology   Anesthesia Other Findings Past Medical History: No date: Allergy No date: Aortic atherosclerosis (HCC) No date: Asthma No date: C. difficile diarrhea No date: Chronic pain No date: Collagenous colitis No date:  Coronary artery disease     Comment:  a.) PCI with 2.75 x 18 mm Resolute Onyx DES x 1 to               prox/mid LAD on 09/05/2019 No date: DDD (degenerative disc disease), cervical No date: DDD (degenerative disc disease), lumbar No date: GERD (gastroesophageal reflux disease) No date: Grade I diastolic dysfunction No date: Hepatic steatosis No date: Hyperlipidemia No date: Hypertension 03/2015: Liver cancer ([place]) No date: Migraines  : Myocardial infarction ([place]) No date: OSA on CPAP No date: Seizures ([place])     Comment:  several as child when sick.  None since age 45 No date: Stroke [facility])     Comment:  'mini-stroke" 30 yrs ago. no deficits. No date: T2DM (type 2 diabetes mellitus) ([place]) No date: Wears dentures     Comment:  full upper and lower  Reproductive/Obstetrics                            Anesthesia Physical Anesthesia Plan  ASA: 3  Anesthesia Plan: Spinal   Post-op Pain Management:    Induction: Intravenous  PONV Risk Score and Plan: 3 and Ondansetron, Dexamethasone, Propofol infusion, TIVA and Midazolam  Airway Management Planned: Natural Airway  Additional Equipment: None  Intra-op Plan:   Post-operative Plan:   Informed Consent: I have reviewed the patients History and Physical, chart, labs and discussed the procedure including the risks, benefits and alternatives for the proposed anesthesia with the patient or authorized representative who has indicated his/her understanding and acceptance.       Plan Discussed with: CRNA and Surgeon  Anesthesia Plan Comments: (Discussed R/B/A of neuraxial anesthesia technique with patient: - rare  risks of spinal/epidural hematoma, nerve damage, infection - Risk of PDPH - Risk of nausea and vomiting - Risk of conversion to general anesthesia and its associated risks, including sore throat, damage to lips/teeth/oropharynx, and rare risks such as cardiac and respiratory events. - Risk of  allergic reactions  Patient has lower back hardware, I advised him increased chance of block failure or inability to place block.   Patient voiced understanding.)        Anesthesia Quick Evaluation

## 2021-06-03 NOTE — Evaluation (Signed)
Physical Therapy Evaluation Patient Details Name: Edward Schneider. MRN: [id number] DOB: June 06, 1956 Today's Date: 06/03/2021   History of Present Illness  Pt is a 65 yo male diagnosed with osteoarthritis of the right hip and is s/p elective R THA.  PMH includes: HTN, CAD, MI, cardiac stents, PVD, depression, DM, aortic atherosclerosis, chronic pain, DDD, liver CA, and seizures (none for over two years per pt/spouse).  Clinical Impression  Pt was pleasant and motivated to participate during the session. Pt reported only chronic pain at his LBP but no right hip pain currently. Pt gave good effort and vitals were stable throughout session. Pt performed all functional mobility with supervision-min guard and min verbal cues for sequencing. Pt able to ambulate within room with overall steadiness and no LOB with verbal cues for sequencing and direction. Pt may benefit from log rolling training next session to improve bed mobility and reduce stress on lower back. Pt will benefit from further OOB activities and stair training next visit as appropriate. Pt is limited with functional mobility secondary to generalized weakness. Pt will benefit from HHPT upon discharge to safely address deficits listed in patient problem list for decreased caregiver assistance and eventual return to PLOF.      Follow Up Recommendations Home health PT;Supervision - Intermittent    Equipment Recommendations  None recommended by PT    Recommendations for Other Services       Precautions / Restrictions Precautions Precautions: Anterior Hip;Fall Precaution Booklet Issued: Yes (comment) Restrictions Weight Bearing Restrictions: Yes RLE Weight Bearing: Weight bearing as tolerated      Mobility  Bed Mobility Overal bed mobility: Needs Assistance Bed Mobility: Supine to Sit;Sit to Supine     Supine to sit: Supervision Sit to supine: Supervision   General bed mobility comments: Min verbal cues for sequencing.     Transfers Overall transfer level: Needs assistance Equipment used: Rolling walker (2 wheeled) Transfers: Sit to/from Stand Sit to Stand: Min guard;From elevated surface         General transfer comment: Verbal cues for sequencing.  Ambulation/Gait Ambulation/Gait assistance: Min guard Gait Distance (Feet): 8 Feet Assistive device: Rolling walker (2 wheeled) Gait Pattern/deviations: Step-to pattern;Decreased step length - left;Decreased stance time - right Gait velocity: decreased   General Gait Details: Pt able to walk forwards/backwards within the room along with sidesteps towards [facility] with overall steadiness and no LOB.  Stairs            Wheelchair Mobility    Modified Rankin (Stroke Patients Only)       Balance Overall balance assessment: Needs assistance Sitting-balance support: Feet unsupported;Bilateral upper extremity supported Sitting balance-Leahy Scale: Good Sitting balance - Comments: Supervision at EOB   Standing balance support: Bilateral upper extremity supported Standing balance-Leahy Scale: Fair Standing balance comment: Reliance on BUE support through RW.                             Pertinent Vitals/Pain Pain Assessment: 0-10 Pain Score: 7  Pain Location: Chronic LBP Pain Descriptors / Indicators: Aching;Sore Pain Intervention(s): Monitored during session;Repositioned    Home Living Family/patient expects to be discharged to:: Private residence Living Arrangements: Spouse/significant other Available Help at Discharge: Family;Available 24 hours/day Type of Home: House Home Access: Stairs to enter Entrance Stairs-Rails: Right;Left;Can reach both Entrance Stairs-Number of Steps: 3 Home Layout: One level Home Equipment: Walker - 2 wheels;Cane - quad;Bedside commode;Shower seat (RW x2; power lift chair)  Prior Function Level of Independence: Independent with assistive device(s)         Comments: Pt reports  ambulating with quad cane with limited community distance and primarily furniture walking in the house. Reports using electric scooter in grocery store. No fall hx. Mod I with all ADLs.     Hand Dominance        Extremity/Trunk Assessment   Upper Extremity Assessment Upper Extremity Assessment: Overall WFL for tasks assessed    Lower Extremity Assessment Lower Extremity Assessment: Generalized weakness;RLE deficits/detail RLE Deficits / Details: Unable to fully assess RLE strength secondary to expected post op pain. BLE sensation to light touch intact RLE: Unable to fully assess due to pain RLE Sensation: WNL    Cervical / Trunk Assessment Cervical / Trunk Assessment: Normal  Communication   Communication: No difficulties  Cognition Arousal/Alertness: Awake/alert Behavior During Therapy: WFL for tasks assessed/performed Overall Cognitive Status: Within Functional Limits for tasks assessed                                        General Comments      Exercises Total Joint Exercises Ankle Circles/Pumps: AROM;Both;10 reps;Supine;Strengthening Quad Sets: AROM;Both;10 reps;Supine;Strengthening Gluteal Sets: AROM;Strengthening;Both;10 reps;Supine Long Arc Quad: AROM;Strengthening;Both;10 reps;Seated Marching in Standing: AROM;Strengthening;Both;10 reps;Standing Other Exercises Other Exercises: HEP and anterior hip precautions provided verbally and via handout and he verbalized understanding. Other Exercises: Static standing 1-2 min with min guard for improved activity tolerance.    Assessment/Plan    PT Assessment Patient needs continued PT services  PT Problem List Decreased strength;Decreased activity tolerance;Decreased balance;Decreased mobility;Decreased knowledge of use of DME;Decreased safety awareness;Decreased knowledge of precautions;Pain       PT Treatment Interventions DME instruction;Gait training;Stair training;Functional mobility  training;Therapeutic activities;Therapeutic exercise;Balance training;Patient/family education    PT Goals (Current goals can be found in the Care Plan section)  Acute Rehab PT Goals Patient Stated Goal: Get back to walking. PT Goal Formulation: With patient Time For Goal Achievement: 06/16/21 Potential to Achieve Goals: Good    Frequency BID   Barriers to discharge        Co-evaluation               AM-PAC PT "6 Clicks" Mobility  Outcome Measure Help needed turning from your back to your side while in a flat bed without using bedrails?: None Help needed moving from lying on your back to sitting on the side of a flat bed without using bedrails?: A Little Help needed moving to and from a bed to a chair (including a wheelchair)?: A Little Help needed standing up from a chair using your arms (e.g., wheelchair or bedside chair)?: A Little Help needed to walk in hospital room?: A Little Help needed climbing 3-5 steps with a railing? : A Lot 6 Click Score: 18    End of Session Equipment Utilized During Treatment: Gait belt Activity Tolerance: Patient tolerated treatment well Patient left: in bed;with call bell/phone within reach;with bed alarm set;with SCD's reapplied;with family/visitor present;Other (comment) (Heels floating via pillow) Nurse Communication: Mobility status;Weight bearing status; Need for recliner in room PT Visit Diagnosis: Other abnormalities of gait and mobility (R26.89);Muscle weakness (generalized) (M62.81);Unsteadiness on feet (R26.81);Pain Pain - Right/Left:  (chronic LBP) Pain - part of body:  (chronic LBP)    Time: [id number] PT Time Calculation (min) (ACUTE ONLY): 40 min   Charges:  Dayton Scrape SPT 06/03/21, 5:28 PM

## 2021-06-04 ENCOUNTER — Encounter: Payer: Self-pay | Admitting: Orthopedic Surgery

## 2021-06-04 LAB — BASIC METABOLIC PANEL
Anion gap: 9 (ref 5–15)
BUN: 17 mg/dL (ref 8–23)
CO2: 25 mmol/L (ref 22–32)
Calcium: 9.5 mg/dL (ref 8.9–10.3)
Chloride: 102 mmol/L (ref 98–111)
Creatinine, Ser: 1.17 mg/dL (ref 0.61–1.24)
GFR, Estimated: >60 mL/min (ref >60)
Glucose, Bld: 164 mg/dL — ABNORMAL HIGH (ref 70–99)
Potassium: 4.3 mmol/L (ref 3.5–5.1)
Sodium: 136 mmol/L (ref 135–145)

## 2021-06-04 LAB — CBC
HCT: 30.7 % — ABNORMAL LOW (ref 39.0–52.0)
Hemoglobin: 10.2 g/dL — ABNORMAL LOW (ref 13.0–17.0)
MCH: 28.6 pg (ref 26.0–34.0)
MCHC: 33.2 g/dL (ref 30.0–36.0)
MCV: 86 fL (ref 80.0–100.0)
Platelets: 174 10*3/uL (ref 150–400)
RBC: 3.57 MIL/uL — ABNORMAL LOW (ref 4.22–5.81)
RDW: 14.5 % (ref 11.5–15.5)
WBC: 10.2 10*3/uL (ref 4.0–10.5)
nRBC: 0 % (ref 0.0–0.2)

## 2021-06-04 LAB — GLUCOSE, CAPILLARY
Glucose-Capillary: 171 mg/dL — ABNORMAL HIGH (ref 70–99)
Glucose-Capillary: 160 mg/dL — ABNORMAL HIGH (ref 70–99)
Glucose-Capillary: 144 mg/dL — ABNORMAL HIGH (ref 70–99)

## 2021-06-04 MED ORDER — OXYCODONE HCL ER 20 MG PO T12A: 20.0000 mg | EXTENDED_RELEASE_TABLET | Freq: Two times a day (BID) | ORAL | 0 refills | Status: AC

## 2021-06-04 MED ORDER — DOCUSATE SODIUM 100 MG PO CAPS: 100.0000 mg | ORAL_CAPSULE | Freq: Two times a day (BID) | ORAL | 0 refills | Status: DC

## 2021-06-04 MED ORDER — TRAMADOL HCL 50 MG PO TABS: 50.0000 mg | ORAL_TABLET | Freq: Four times a day (QID) | ORAL | 0 refills | Status: DC | PRN

## 2021-06-04 MED ORDER — METHOCARBAMOL 500 MG PO TABS: 500.0000 mg | ORAL_TABLET | Freq: Four times a day (QID) | ORAL | 0 refills | Status: DC | PRN

## 2021-06-04 MED ORDER — SENNOSIDES-DOCUSATE SODIUM 8.6-50 MG PO TABS: 1.0000 | ORAL_TABLET | Freq: Every evening | ORAL | 0 refills | Status: DC | PRN

## 2021-06-04 MED ORDER — ENOXAPARIN SODIUM 40 MG/0.4ML IJ SOSY: 40.0000 mg | PREFILLED_SYRINGE | INTRAMUSCULAR | 0 refills | Status: DC

## 2021-06-04 MED ORDER — HEMOSTATIC AGENTS (NO CHARGE) OPTIME
TOPICAL | Status: DC | PRN
  Administered 2021-06-03: 2 via TOPICAL

## 2021-06-04 MED ORDER — OXYCODONE HCL 10 MG PO TABS: 10.0000 mg | ORAL_TABLET | ORAL | 0 refills | Status: DC | PRN

## 2021-06-04 NOTE — TOC Transition Note (Signed)
Transition of Care [facility]) - CM/SW Discharge Note   Patient Details  Name: Edward Schneider. MRN: [id number] Date of Birth: [birth year]  Transition of Care [facility]) CM/SW Contact:  Candie Chroman, LCSW Phone Number: 06/04/2021, 10:46 AM   Clinical Narrative:   Patient has orders to discharge home today. Patient was set up with [facility] prior to admission. He is agreeable to this. No DME needs. No further concerns. CSW signing off.  Final next level of care: [place] Barriers to Discharge: No Barriers Identified   Patient Goals and CMS Choice     Choice offered to / list presented to : Patient  Discharge Placement                    Patient and family notified of of transfer: 06/04/21  Discharge Plan and Services                          HH Arranged: PT [facility] Agency: [place] Date [place]: 06/04/21   Representative spoke with at [place]: [place]  Social Determinants of Health ([place]) Interventions     Readmission Risk Interventions No flowsheet data found.

## 2021-06-04 NOTE — Progress Notes (Signed)
Physical Therapy Treatment Patient Details Name: Edward Schneider. MRN: [id number] DOB: September 11, 1956 Today's Date: 06/04/2021    History of Present Illness Pt is a 65 yo male diagnosed with osteoarthritis of the right hip and is s/p elective R THA.  PMH includes: HTN, CAD, MI, cardiac stents, PVD, depression, DM, aortic atherosclerosis, chronic pain, DDD, liver CA, and seizures (none for over two years per pt/spouse).    PT Comments    Pt was pleasant and motivated to participate during the session. Pt gave good effort throughout session. Pt reported 10/10 right hip pain at rest which decreased to 8/10 after walking. Pt's SpO2 on RA at rest was 90-92% which increased to 93-95% after walking. Pt performed all functional mobility with min guard and min verbal cues for sequencing. Pt able to ambulate steadily with no LOB but walked very slowly requiring multiple standing rest breaks throughout exhibiting increased respiratory rate. Pt limited with walking distance secondary to pain and fatigue. Pt will attempt stair training and further ambulation next visit as appropriate. Pt will benefit from HHPT upon discharge to safely address deficits listed in patient problem list for decreased caregiver assistance and eventual return to PLOF.     Follow Up Recommendations  Home health PT;Supervision - Intermittent     Equipment Recommendations  None recommended by PT    Recommendations for Other Services       Precautions / Restrictions Precautions Precautions: Anterior Hip;Fall Precaution Booklet Issued: Yes (comment) Restrictions Weight Bearing Restrictions: Yes RLE Weight Bearing: Weight bearing as tolerated    Mobility  Bed Mobility Overal bed mobility: Needs Assistance Bed Mobility: Supine to Sit     Supine to sit: Supervision     General bed mobility comments: Min verbal cues for sequencing.    Transfers Overall transfer level: Needs assistance Equipment used: Rolling walker (2  wheeled) Transfers: Sit to/from Stand Sit to Stand: Min guard;From elevated surface         General transfer comment: Verbal cues for sequencing.  Ambulation/Gait Ambulation/Gait assistance: Min guard Gait Distance (Feet): 110 Feet Assistive device: Rolling walker (2 wheeled) Gait Pattern/deviations: Step-to pattern;Decreased step length - left;Decreased stance time - right;Antalgic;Step-through pattern Gait velocity: decreased   General Gait Details: Pt walked slowly using rolling walker able to progress to step-through pattern. Pt required multiple standing rest breaks during walk exhibiting increased respiratory rate.   Stairs             Wheelchair Mobility    Modified Rankin (Stroke Patients Only)       Balance Overall balance assessment: Needs assistance Sitting-balance support: Feet unsupported;Bilateral upper extremity supported Sitting balance-Leahy Scale: Good Sitting balance - Comments: Supervision at EOB   Standing balance support: Bilateral upper extremity supported Standing balance-Leahy Scale: Fair Standing balance comment: Reliance on BUE support through RW.                            Cognition Arousal/Alertness: Awake/alert Behavior During Therapy: WFL for tasks assessed/performed Overall Cognitive Status: Within Functional Limits for tasks assessed                                        Exercises Total Joint Exercises Long Arc Quad: AROM;Strengthening;Both;10 reps;Seated Marching in Standing: AROM;Strengthening;Both;10 reps;Standing Other Exercises Other Exercises: Static standing 30sec-29mn x4 with min guard for improved activity tolerance.  General Comments        Pertinent Vitals/Pain Pain Assessment: 0-10 Pain Score: 10-Worst pain ever Pain Location: Left hip pain Pain Descriptors / Indicators: Aching;Sore Pain Intervention(s): Limited activity within patient's tolerance;Monitored during  session;Premedicated before session;Repositioned    Home Living                      Prior Function            PT Goals (current goals can now be found in the care plan section) Progress towards PT goals: Progressing toward goals    Frequency    BID      PT Plan Current plan remains appropriate    Co-evaluation              AM-PAC PT "6 Clicks" Mobility   Outcome Measure  Help needed turning from your back to your side while in a flat bed without using bedrails?: None Help needed moving from lying on your back to sitting on the side of a flat bed without using bedrails?: A Little Help needed moving to and from a bed to a chair (including a wheelchair)?: A Little Help needed standing up from a chair using your arms (e.g., wheelchair or bedside chair)?: A Little Help needed to walk in hospital room?: A Little Help needed climbing 3-5 steps with a railing? : A Lot 6 Click Score: 18    End of Session Equipment Utilized During Treatment: Gait belt Activity Tolerance: Patient tolerated treatment well Patient left: with call bell/phone within reach;with SCD's reapplied;with family/visitor present;in chair;with chair alarm set Nurse Communication: Mobility status;Weight bearing status PT Visit Diagnosis: Other abnormalities of gait and mobility (R26.89);Muscle weakness (generalized) (M62.81);Unsteadiness on feet (R26.81);Pain Pain - Right/Left: Right Pain - part of body: Hip     Time: [id number] PT Time Calculation (min) (ACUTE ONLY): 43 min  Charges:                        Dayton Scrape SPT 06/04/21, 1:25 PM

## 2021-06-04 NOTE — Discharge Instructions (Signed)

## 2021-06-04 NOTE — Progress Notes (Signed)
   Subjective: 1 Day Post-Op Procedure(s) (LRB): TOTAL HIP ARTHROPLASTY ANTERIOR APPROACH (Right) Patient reports pain as moderate.   Patient is well, and has had no acute complaints or problems Denies any CP, SOB, ABD pain. We will continue therapy today.  Plan is to go Home after hospital stay.  Objective: Vital signs in last 24 hours: Temp:  [97.8 F (36.6 C)-99.6 F (37.6 C)] 98.4 F (36.9 C) (08/17 0814) Pulse Rate:  [64-106] 106 (08/17 0814) Resp:  [13-20] 18 (08/17 0814) BP: (94-145)/(56-82) 132/78 (08/17 0814) SpO2:  [92 %-100 %] 94 % (08/17 0814) Weight:  [108.4 kg] 108.4 kg (08/16 1026)  Intake/Output from previous day: 08/16 0701 - 08/17 0700 In: 1090 [P.O.:240; I.V.:850] Out: 3200 [Urine:2900; Blood:300] Intake/Output this shift: No intake/output data recorded.  Recent Labs    06/03/21 1726 06/04/21 0334  HGB 11.0* 10.2*   Recent Labs    06/03/21 1726 06/04/21 0334  WBC 14.5* 10.2  RBC 3.75* 3.57*  HCT 32.9* 30.7*  PLT 181 174   Recent Labs    06/03/21 1726 06/04/21 0334  NA  --  136  K  --  4.3  CL  --  102  CO2  --  25  BUN  --  17  CREATININE 1.05 1.17  GLUCOSE  --  164*  CALCIUM  --  9.5   No results for input(s): LABPT, INR in the last 72 hours.  EXAM General - Patient is Alert, Appropriate, and Oriented Extremity - Neurovascular intact Sensation intact distally Intact pulses distally Dorsiflexion/Plantar flexion intact No cellulitis present Compartment soft Dressing - dressing C/D/I and no drainage, prevena intact with out drainage Motor Function - intact, moving foot and toes well on exam.   Past Medical History:  Diagnosis Date   Allergy    Aortic atherosclerosis (HCC)    Asthma    C. difficile diarrhea    Chronic pain    Collagenous colitis    Coronary artery disease    a.) PCI with 2.75 x 18 mm Resolute Onyx DES x 1 to prox/mid LAD on 09/05/2019   DDD (degenerative disc disease), cervical    DDD (degenerative disc  disease), lumbar    GERD (gastroesophageal reflux disease)    Grade I diastolic dysfunction    Hepatic steatosis    Hyperlipidemia    Hypertension    Liver cancer ([place]) 03/2015   Migraines    Myocardial infarction (HCC)     OSA on CPAP    Seizures ([place])    several as child when sick.  None since age 19   Stroke [facility])    'mini-stroke" 30 yrs ago. no deficits.   T2DM (type 2 diabetes mellitus) ([place])    Wears dentures    full upper and lower    Assessment/Plan:   1 Day Post-Op Procedure(s) (LRB): TOTAL HIP ARTHROPLASTY ANTERIOR APPROACH (Right) Active Problems:   Status post total hip replacement, right  Estimated body mass index is 36.34 kg/m as calculated from the following:   Height as of this encounter: '5\' 8"'$  (1.727 m).   Weight as of this encounter: 108.4 kg. Advance diet Up with therapy Work on BM Pain controlled Labs and VSS CM to assist with discharge to home with HHPT  DVT Prophylaxis - Lovenox, TED hose, and SCDs Weight-Bearing as tolerated to right leg   T. Rachelle Hora, PA-C [place] 06/04/2021, 8:23 AM
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
## 2021-06-04 NOTE — Progress Notes (Signed)
Inpatient Diabetes Program Recommendations  AACE/ADA: New Consensus Statement on Inpatient Glycemic Control (2015)  Target Ranges:  Prepandial:   less than 140 mg/dL      Peak postprandial:   less than 180 mg/dL (1-2 hours)      Critically ill patients:  140 - 180 mg/dL   Lab Results  Component Value Date   GLUCAP 160 (H) 06/04/2021   HGBA1C 7.6 (H) 05/05/2021    Review of Glycemic Control Results for STEVENSON, MCKASKLE (MRN [id number]) as of 06/04/2021 12:15  Ref. Range 06/03/2021 10:46 06/03/2021 14:11 06/03/2021 17:15 06/04/2021 08:01 06/04/2021 11:22  Glucose-Capillary Latest Ref Range: 70 - 99 mg/dL 146 (H) 139 (H) 128 (H) 171 (H) 160 (H)   Diabetes history: DM 2 Outpatient Diabetes medications:  Trulicity 1.5 mg daily, Basaglar 32 units q HS, Humalog 22 units with breakfast, 16 units with lunch and supper Current orders for Inpatient glycemic control:  Novolog moderate tid with meals Novolog 16 units tid with meals Semglee 32 units q HS  Inpatient Diabetes Program Recommendations:   Agree with current orders.  Blood sugars are excellent in the hospital.  Patient to d/c home today.   Thanks  Adah Perl, RN, BC-ADM Inpatient Diabetes Coordinator Pager [phone]  (8a-5p)

## 2021-06-04 NOTE — Plan of Care (Signed)
Patient discharged home per MD orders at this time.All discharge instructions,education and medications reviewed with patient and wife. Pt expressed understanding and will comply with dc instructions.follow up appointments was also communicated to patient.no verbal c/o or any ssx of distress.Pt discharged home with HH/PT services per order. Patient was transported home by wife in a private car.

## 2021-06-04 NOTE — Anesthesia Postprocedure Evaluation (Signed)
Anesthesia Post Note  Patient: Edward Schneider.  Procedure(s) Performed: TOTAL HIP ARTHROPLASTY ANTERIOR APPROACH (Right: Hip)  Patient location during evaluation: Nursing Unit Anesthesia Type: Spinal Level of consciousness: oriented and awake and alert Pain management: pain level controlled Vital Signs Assessment: post-procedure vital signs reviewed and stable Respiratory status: spontaneous breathing and respiratory function stable Cardiovascular status: blood pressure returned to baseline and stable Postop Assessment: no headache, no backache, no apparent nausea or vomiting and patient able to bend at knees Anesthetic complications: no   No notable events documented.   Last Vitals:  Vitals:   06/04/21 0800 06/04/21 0814  BP:  132/78  Pulse:  (!) 106  Resp:  18  Temp:  36.9 C  SpO2: 93% 94%    Last Pain:  Vitals:   06/04/21 0814  TempSrc: Oral  PainSc:                  Hedda Slade

## 2021-06-04 NOTE — Progress Notes (Addendum)
Foley removed at 0600, Pt tolerated procedure. Pt due to void by noon.  Pain not controlled.

## 2021-06-04 NOTE — Discharge Summary (Signed)
Physician Discharge Summary  Patient ID: Edward Schneider. MRN: [id number] DOB/AGE: 1956/05/10 65 y.o.  Admit date: 06/03/2021 Discharge date: 06/04/2021  Admission Diagnoses:  Status post total hip replacement, right [Z96.641]   Discharge Diagnoses: Patient Active Problem List   Diagnosis Date Noted   Status post total hip replacement, right 06/03/2021   Senile purpura ([place]) 12/06/2020   Family history of malignant neoplasm of gastrointestinal tract    Depression, major, single episode, moderate ([place]) 03/06/2020   Uncontrolled type 2 diabetes mellitus with hyperglycemia ([place]) 10/29/2019   Coronary artery disease of native artery of native heart with stable angina pectoris ([place]) 08/31/2019   Pain due to onychomycosis of toenails of both feet 08/10/2019   Morbid obesity ([place]) 05/17/2019   BPH (benign prostatic hyperplasia) 04/27/2018   Knee pain, right 02/08/2018   Sleep apnea 02/08/2018   GERD (gastroesophageal reflux disease) 04/04/2017   Advanced care planning/counseling discussion 03/29/2017   DM type 2 with diabetic peripheral neuropathy ([place]) 09/30/2016   Insomnia 12/24/2015   Generalized abdominal pain    Hyperlipidemia associated with type 2 diabetes mellitus ([place]) 09/23/2015   Bilateral carotid artery stenosis 09/11/2015   Atherosclerosis of abdominal aorta ([place]) 08/26/2015   Migraine headache 08/21/2015   Emphysema lung ([place]) 04/25/2015   Hypertension associated with diabetes ([place]) 04/25/2015   DDD (degenerative disc disease), cervical 03/27/2015   DDD (degenerative disc disease), lumbar 03/27/2015   Cervical post-laminectomy syndrome 03/27/2015   Bilateral occipital neuralgia 03/27/2015   Pancreatic insufficiency 01/28/2015   Chronic back pain 12/24/2014    Past Medical History:  Diagnosis Date   Allergy    Aortic atherosclerosis (HCC)    Asthma    C. difficile diarrhea    Chronic pain    Collagenous colitis    Coronary artery disease    a.) PCI with  2.75 x 18 mm Resolute Onyx DES x 1 to prox/mid LAD on 09/05/2019   DDD (degenerative disc disease), cervical    DDD (degenerative disc disease), lumbar    GERD (gastroesophageal reflux disease)    Grade I diastolic dysfunction    Hepatic steatosis    Hyperlipidemia    Hypertension    Liver cancer ([place]) 03/2015   Migraines    Myocardial infarction (HCC)     OSA on CPAP    Seizures ([place])    several as child when sick.  None since age 12   Stroke [facility])    'mini-stroke" 30 yrs ago. no deficits.   T2DM (type 2 diabetes mellitus) ([place])    Wears dentures    full upper and lower     Transfusion: none   Consultants (if any):   Discharged Condition: Improved  Hospital Course: Edward Schneider. is an 65 y.o. male who was admitted 06/03/2021 with a diagnosis of right hip osteoarthritis and went to the operating room on 06/03/2021 and underwent the above named procedures.    Surgeries: Procedure(s): TOTAL HIP ARTHROPLASTY ANTERIOR APPROACH on 06/03/2021 Patient tolerated the surgery well. Taken to PACU where she was stabilized and then transferred to the orthopedic floor.  Started on Lovenox 40 mg q 24 hrs. Foot pumps applied bilaterally at 80 mm. Heels elevated on bed with rolled towels. No evidence of DVT. Negative [place]. Physical therapy started on day #1 for gait training and transfer. OT started day #1 for ADL and assisted devices.  Patient's foley was d/c on day #1. Patient's IV was d/c on day #1.  On post op day #1  patient was stable and ready for discharge to home with HHPT.    He was given perioperative antibiotics:  Anti-infectives (From admission, onward)    Start     Dose/Rate Route Frequency Ordered Stop   06/03/21 1800  ceFAZolin (ANCEF) IVPB 2g/100 mL premix        2 g 200 mL/hr over 30 Minutes Intravenous Every 6 hours 06/03/21 1521 06/03/21 2344   06/03/21 1027  ceFAZolin (ANCEF) 2-4 GM/100ML-% IVPB       Note to Pharmacy: Herby Abraham   : cabinet override       06/03/21 1027 06/03/21 1228   06/03/21 0600  ceFAZolin (ANCEF) IVPB 2g/100 mL premix        2 g 200 mL/hr over 30 Minutes Intravenous On call to O.R. 06/02/21 2237 06/03/21 1225     .  He was given sequential compression devices, early ambulation, and Lovenox TEds for DVT prophylaxis.  He benefited maximally from the hospital stay and there were no complications.    Recent vital signs:  Vitals:   06/04/21 0800 06/04/21 0814  BP:  132/78  Pulse:  (!) 106  Resp:  18  Temp:  98.4 F (36.9 C)  SpO2: 93% 94%    Recent laboratory studies:  Lab Results  Component Value Date   HGB 10.2 (L) 06/04/2021   HGB 11.0 (L) 06/03/2021   HGB 12.9 (L) 05/27/2021   Lab Results  Component Value Date   WBC 10.2 06/04/2021   PLT 174 06/04/2021   Lab Results  Component Value Date   INR 0.93 04/03/2017   Lab Results  Component Value Date   NA 136 06/04/2021   K 4.3 06/04/2021   CL 102 06/04/2021   CO2 25 06/04/2021   BUN 17 06/04/2021   CREATININE 1.17 06/04/2021   GLUCOSE 164 (H) 06/04/2021    Discharge Medications:   Allergies as of 06/04/2021       Reactions   Bee Venom Anaphylaxis   Crestor [rosuvastatin Calcium] Shortness Of Breath, Swelling   Fentanyl Itching, Hives   blisters Patch   Gabapentin Diarrhea   Severe diarrhea which caused incontinence, loss of appetite and weight loss.   Shellfish Allergy Anaphylaxis, Swelling   Shrimp causes throat to swell and tingling in tongue.    Furosemide Nausea And Vomiting   Buprenorphine Hcl Itching   Simvastatin Diarrhea        Medication List     STOP taking these medications    aspirin 81 MG EC tablet   aspirin-acetaminophen-caffeine 250-250-65 MG tablet Commonly known as: EXCEDRIN MIGRAINE       TAKE these medications    acetaminophen 650 MG CR tablet Commonly known as: TYLENOL Take 1,300 mg by mouth every 8 (eight) hours as needed for pain.   albuterol 108 (90 Base) MCG/ACT inhaler Commonly known as:  Ventolin HFA Inhale 2 puffs into the lungs every 6 (six) hours as needed for wheezing or shortness of breath.   atorvastatin 80 MG tablet Commonly known as: LIPITOR TAKE 1 TABLET BY MOUTH AT BEDTIME   Basaglar KwikPen 100 UNIT/ML Inject 32 Units into the skin at bedtime.   benazepril 10 MG tablet Commonly known as: LOTENSIN Take 1 tablet (10 mg total) by mouth daily.   calcium carbonate 600 MG Tabs tablet Commonly known as: OS-CAL Take 600 mg by mouth daily.   Centrum Silver 50+Men Tabs Take 1 tablet by mouth daily. What changed: Another medication with the same name was removed.  Continue taking this medication, and follow the directions you see here.   cephALEXin 500 MG capsule Commonly known as: KEFLEX Take 500 mg by mouth 2 (two) times daily.   cetirizine 10 MG tablet Commonly known as: ZYRTEC Take 10 mg by mouth daily.   CINNAMON PO Take 1,000 mg by mouth 2 (two) times daily.   clobetasol cream 0.05 % Commonly known as: TEMOVATE Apply 1 application topically 2 (two) times daily.   cyclobenzaprine 10 MG tablet Commonly known as: FLEXERIL Take 1 tablet (10 mg total) by mouth 3 (three) times daily as needed for muscle spasms.   dapagliflozin propanediol 5 MG Tabs tablet Commonly known as: Farxiga Take 1 tablet (5 mg total) by mouth daily.   diclofenac Sodium 1 % Gel Commonly known as: VOLTAREN Apply 2 g topically 4 (four) times daily as needed (pain).   diphenhydrAMINE 25 mg capsule Commonly known as: BENADRYL Take 25 mg by mouth in the morning, at noon, and at bedtime.   docusate sodium 100 MG capsule Commonly known as: COLACE Take 1 capsule (100 mg total) by mouth 2 (two) times daily.   doxepin 25 MG capsule Commonly known as: SINEQUAN Take 50 mg by mouth at bedtime.   doxycycline 100 MG capsule Commonly known as: VIBRAMYCIN Take 100 mg by mouth 2 (two) times daily.   DULoxetine 30 MG capsule Commonly known as: CYMBALTA Take 60 mg by mouth  daily.   DULoxetine 60 MG capsule Commonly known as: CYMBALTA Take 1 capsule (60 mg total) by mouth at bedtime.   enoxaparin 40 MG/0.4ML injection Commonly known as: LOVENOX Inject 0.4 mLs (40 mg total) into the skin daily for 14 days.   ezetimibe 10 MG tablet Commonly known as: ZETIA Take 1 tablet by mouth once daily   FreeStyle Libre 2 Sensor Misc Use as instructed to check blood sugar daily   GINGER PO Take 1 Dose by mouth daily.   Ginseng 100 MG Caps Take 100 mg by mouth daily.   HumaLOG KwikPen 200 UNIT/ML KwikPen Generic drug: insulin lispro Inject into the skin. Injecting 22 units with Breakfast, 16 units with lunch and supper   isosorbide mononitrate 60 MG 24 hr tablet Commonly known as: IMDUR Take 1 tablet (60 mg total) by mouth daily.   methocarbamol 500 MG tablet Commonly known as: ROBAXIN Take 1 tablet (500 mg total) by mouth every 6 (six) hours as needed for muscle spasms.   mupirocin ointment 2 % Commonly known as: BACTROBAN Apply 1 application topically daily.   Narcan 4 MG/0.1ML Liqd nasal spray kit Generic drug: naloxone Place 0.4 mg into the nose as needed (opioid overdose).   nitroGLYCERIN 0.4 MG SL tablet Commonly known as: NITROSTAT Place 0.4 mg under the tongue every 5 (five) minutes as needed for chest pain.   Nurtec 75 MG Tbdp Generic drug: Rimegepant Sulfate Take one tablet (75 MG) by mouth every 24 hours as needed only for migraine.   Omega-3 1000 MG Caps Take 1,000 mg by mouth daily.   Oxycodone HCl 10 MG Tabs Take 1-1.5 tablets (10-15 mg total) by mouth every 4 (four) hours as needed for severe pain (pain score 7-10). What changed:  medication strength how much to take how to take this when to take this reasons to take this additional instructions   oxyCODONE 20 mg 12 hr tablet Commonly known as: OXYCONTIN Take 1 tablet (20 mg total) by mouth every 12 (twelve) hours for 7 days. What changed: You were already  taking a  medication with the same name, and this prescription was added. Make sure you understand how and when to take each.   pregabalin 50 MG capsule Commonly known as: LYRICA TAKE 1 CAPSULE BY MOUTH THREE TIMES DAILY   senna-docusate 8.6-50 MG tablet Commonly known as: Senokot-S Take 1 tablet by mouth at bedtime as needed for mild constipation.   Stiolto Respimat 2.5-2.5 MCG/ACT Aers Generic drug: Tiotropium Bromide-Olodaterol Inhale 2 puffs into the lungs daily as needed (shortness of breath).   sucralfate 1 g tablet Commonly known as: CARAFATE TAKE 1 TABLET BY MOUTH 4 TIMES DAILY WITH MEALS AND AT BEDTIME   traMADol 50 MG tablet Commonly known as: ULTRAM Take 1 tablet (50 mg total) by mouth every 6 (six) hours as needed.   traZODone 100 MG tablet Commonly known as: DESYREL Take 1 tablet (100 mg total) by mouth at bedtime as needed for sleep.   triamcinolone cream 0.1 % Commonly known as: KENALOG Apply 1 application topically 2 (two) times daily.   Trulicity 1.5 QB/1.6XI Sopn Generic drug: Dulaglutide Inject 1.5 mg into the skin once a week.   vitamin A 10000 UNIT capsule Take 2,400 Units by mouth daily.   vitamin B-12 1000 MCG tablet Commonly known as: CYANOCOBALAMIN Take 500 mcg by mouth daily.   vitamin C 1000 MG tablet Take 1,000 mg by mouth daily.   Vitamin D3 25 MCG (1000 UT) Chew Chew 1,000 Units by mouth daily.   Vitamin E 400 units Tabs Take 400 Units by mouth daily.               Durable Medical Equipment  (From admission, onward)           Start     Ordered   06/03/21 1521  DME Walker rolling  Once       Question Answer Comment  Walker: With 5 Inch Wheels   Patient needs a walker to treat with the following condition Status post total hip replacement, right      06/03/21 1521   06/03/21 1521  DME 3 n 1  Once        06/03/21 1521   06/03/21 1521  DME Bedside commode  Once       Question:  Patient needs a bedside commode to treat with the  following condition  Answer:  Status post total hip replacement, right   06/03/21 1521            Diagnostic Studies: DG HIP OPERATIVE UNILAT W OR W/O PELVIS RIGHT  Result Date: 06/03/2021 CLINICAL DATA:  Right hip replacement. EXAM: OPERATIVE RIGHT HIP (WITH PELVIS IF PERFORMED) TECHNIQUE: Fluoroscopic spot image(s) were submitted for interpretation post-operatively. COMPARISON:  None. FINDINGS: Three fluoroscopic spot views obtained in the operating room demonstrates interval right hip arthroplasty. Fluoroscopy time 18 seconds. IMPRESSION: Procedural fluoroscopy for right hip arthroplasty. Electronically Signed   By: Keith Rake M.D.   On: 06/03/2021 15:08   DG HIP UNILAT W OR W/O PELVIS 2-3 VIEWS RIGHT  Result Date: 06/03/2021 CLINICAL DATA:  Postop. EXAM: DG HIP (WITH OR WITHOUT PELVIS) 2-3V RIGHT COMPARISON:  None. FINDINGS: Right hip arthroplasty in expected alignment. There is no periprosthetic lucency or fracture. Recent postsurgical change includes air and edema in the joint spaces and soft tissues. Lateral skin staples and wound VAC in place. IMPRESSION: Right hip arthroplasty without immediate postoperative complication. Electronically Signed   By: Keith Rake M.D.   On: 06/03/2021 15:09    Disposition:  Follow-up Information     Duanne Guess, PA-C Follow up in 2 week(s).   Specialties: Orthopedic Surgery, Emergency Medicine Contact information: [place] [address] [phone]                  Signed: Feliberto Gottron 06/04/2021, 10:38 AM

## 2021-06-04 NOTE — Progress Notes (Signed)
Physical Therapy Treatment Patient Details Name: Edward Schneider. MRN: [id number] DOB: 26-Jan-1956 Today's Date: 06/04/2021    History of Present Illness Pt is a 65 yo male diagnosed with osteoarthritis of the right hip and is s/p elective R THA.  PMH includes: HTN, CAD, MI, cardiac stents, PVD, depression, DM, aortic atherosclerosis, chronic pain, DDD, liver CA, and seizures (none for over two years per pt/spouse).    PT Comments    Pt was pleasant and motivated to participate during the session. Pt gave good effort and vitals were stable throughout session. Pt performed all functional mobility with min guard A and min verbal cues for sequencing. Pt able to ascend/descend 4 steps x2 exhibiting some difficulty descending but was overall steady. Pt required 2-65mn sitting breaks in between stair attempts secondary to fatigue. Pt still limited with walking distance total secondary to fatigue but similar to reported baseline mobility. Pt encouraged to have chairs strategically placed in house to sit down for rest breaks as needed secondary to fatigue. Pt will benefit from HHPT upon discharge to safely address deficits listed in patient problem list for decreased caregiver assistance and eventual return to PLOF.     Follow Up Recommendations  Home health PT;Supervision - Intermittent     Equipment Recommendations  None recommended by PT    Recommendations for Other Services       Precautions / Restrictions Precautions Precautions: Anterior Hip;Fall Precaution Booklet Issued: Yes (comment) Restrictions Weight Bearing Restrictions: Yes RLE Weight Bearing: Weight bearing as tolerated    Mobility  Bed Mobility Overal bed mobility: Needs Assistance Bed Mobility: Sit to Supine     Supine to sit: Supervision;HOB elevated Sit to supine: Supervision   General bed mobility comments: Verbal cues for sequencing.    Transfers Overall transfer level: Needs assistance Equipment used:  Rolling walker (2 wheeled) Transfers: Sit to/from Stand Sit to Stand: Min guard;From elevated surface         General transfer comment: Min verbal cues for sequencing.  Ambulation/Gait Ambulation/Gait assistance: Min guard Gait Distance (Feet): 100 Feet; 118fx3 Assistive device: Rolling walker (2 wheeled) Gait Pattern/deviations: Step-to pattern;Decreased step length - left;Decreased stance time - right;Antalgic;Step-through pattern Gait velocity: decreased   General Gait Details: Pt walked slowly using rolling walker with step-through pattern. Verbal cues to increase WB through RLE and decrease excessive WB through BUE. Pt limited primarily by fatigue.   Stairs Stairs: Yes Stairs assistance: Min guard Stair Management: Two rails;Step to pattern;Forwards Number of Stairs: 4 x2 General stair comments: Pt able to ascend stairs with no difficulty, but while descending stairs pt demonstrated some difficulty clearing right foot having to almost slide it off to the next step. Verbal cues for sequencing and demonstration provided with wife present and he demonstrated understanding with min cueing for hand placement and sequencing.   Wheelchair Mobility    Modified Rankin (Stroke Patients Only)       Balance Overall balance assessment: Needs assistance Sitting-balance support: Feet unsupported;Bilateral upper extremity supported Sitting balance-Leahy Scale: Good Sitting balance - Comments: Supervision at EOB   Standing balance support: Bilateral upper extremity supported Standing balance-Leahy Scale: Fair Standing balance comment: Reliance on UE support through RW                            Cognition Arousal/Alertness: Awake/alert Behavior During Therapy: WFL for tasks assessed/performed Overall Cognitive Status: Within Functional Limits for tasks assessed  Exercises Total Joint Exercises Long Arc Quad:  AROM;Strengthening;Both;10 reps;Seated Marching in Standing: AROM;Strengthening;Both;10 reps;Standing Other Exercises Other Exercises: Car transfer training provided verbally and via simulation with wife present and they verbalized understanding. Other Exercises: Static standing 1-2 min x2 with min guard for improved activity tolerance. Other Exercises: Stair training provided verbally and via demonstration with wife present and he demonstrated understanding.    General Comments        Pertinent Vitals/Pain Pain Assessment: 0-10 Pain Score: 6  Pain Location: Left hip pain Pain Descriptors / Indicators: Aching;Sore Pain Intervention(s): Limited activity within patient's tolerance;Monitored during session;Repositioned    Home Living Family/patient expects to be discharged to:: Private residence Living Arrangements: Spouse/significant other Available Help at Discharge: Family;Available 24 hours/day Type of Home: House Home Access: Stairs to enter Entrance Stairs-Rails: Right;Left;Can reach both Home Layout: One level Home Equipment: Environmental consultant - 2 wheels;Cane - quad;Bedside commode;Shower seat;Other (comment) (power lift chair)      Prior Function Level of Independence: Independent with assistive device(s)      Comments: Pt reports ambulating with quad cane with limited community distance and primarily furniture walking in the house. Reports using electric scooter in grocery store. No fall hx. Mod I with all ADLs.   PT Goals (current goals can now be found in the care plan section) Acute Rehab PT Goals Patient Stated Goal: Get back to walking. Progress towards PT goals: Progressing toward goals    Frequency    BID      PT Plan Current plan remains appropriate    Co-evaluation              AM-PAC PT "6 Clicks" Mobility   Outcome Measure  Help needed turning from your back to your side while in a flat bed without using bedrails?: None Help needed moving from lying  on your back to sitting on the side of a flat bed without using bedrails?: A Little Help needed moving to and from a bed to a chair (including a wheelchair)?: A Little Help needed standing up from a chair using your arms (e.g., wheelchair or bedside chair)?: A Little Help needed to walk in hospital room?: A Little Help needed climbing 3-5 steps with a railing? : A Little 6 Click Score: 19    End of Session Equipment Utilized During Treatment: Gait belt Activity Tolerance: Patient tolerated treatment well Patient left: with call bell/phone within reach;with SCD's reapplied;with family/visitor present;in bed;with bed alarm set Nurse Communication: Mobility status;Weight bearing status PT Visit Diagnosis: Other abnormalities of gait and mobility (R26.89);Muscle weakness (generalized) (M62.81);Unsteadiness on feet (R26.81);Pain Pain - Right/Left: Right Pain - part of body: Hip     Time: 1349-1500 PT Time Calculation (min) (ACUTE ONLY): 71 min  Charges:                        Dayton Scrape SPT 06/04/21, 4:39 PM

## 2021-06-05 ENCOUNTER — Ambulatory Visit: Payer: Medicare Other | Admitting: Nurse Practitioner

## 2021-06-05 LAB — SURGICAL PATHOLOGY

## 2021-06-06 DIAGNOSIS — M961 Postlaminectomy syndrome, not elsewhere classified: Secondary | ICD-10-CM | POA: Diagnosis not present

## 2021-06-06 DIAGNOSIS — E1169 Type 2 diabetes mellitus with other specified complication: Secondary | ICD-10-CM | POA: Diagnosis not present

## 2021-06-06 DIAGNOSIS — I10 Essential (primary) hypertension: Secondary | ICD-10-CM | POA: Diagnosis not present

## 2021-06-06 DIAGNOSIS — E1142 Type 2 diabetes mellitus with diabetic polyneuropathy: Secondary | ICD-10-CM | POA: Diagnosis not present

## 2021-06-06 DIAGNOSIS — M5033 Other cervical disc degeneration, cervicothoracic region: Secondary | ICD-10-CM | POA: Diagnosis not present

## 2021-06-06 DIAGNOSIS — G4700 Insomnia, unspecified: Secondary | ICD-10-CM | POA: Diagnosis not present

## 2021-06-06 DIAGNOSIS — Z8505 Personal history of malignant neoplasm of liver: Secondary | ICD-10-CM | POA: Diagnosis not present

## 2021-06-06 DIAGNOSIS — G43909 Migraine, unspecified, not intractable, without status migrainosus: Secondary | ICD-10-CM | POA: Diagnosis not present

## 2021-06-06 DIAGNOSIS — E785 Hyperlipidemia, unspecified: Secondary | ICD-10-CM | POA: Diagnosis not present

## 2021-06-06 DIAGNOSIS — G4733 Obstructive sleep apnea (adult) (pediatric): Secondary | ICD-10-CM | POA: Diagnosis not present

## 2021-06-06 DIAGNOSIS — J439 Emphysema, unspecified: Secondary | ICD-10-CM | POA: Diagnosis not present

## 2021-06-06 DIAGNOSIS — I2510 Atherosclerotic heart disease of native coronary artery without angina pectoris: Secondary | ICD-10-CM | POA: Diagnosis not present

## 2021-06-06 DIAGNOSIS — Z96651 Presence of right artificial knee joint: Secondary | ICD-10-CM | POA: Diagnosis not present

## 2021-06-06 DIAGNOSIS — I6529 Occlusion and stenosis of unspecified carotid artery: Secondary | ICD-10-CM | POA: Diagnosis not present

## 2021-06-06 DIAGNOSIS — G8929 Other chronic pain: Secondary | ICD-10-CM | POA: Diagnosis not present

## 2021-06-06 DIAGNOSIS — K219 Gastro-esophageal reflux disease without esophagitis: Secondary | ICD-10-CM | POA: Diagnosis not present

## 2021-06-06 DIAGNOSIS — Z471 Aftercare following joint replacement surgery: Secondary | ICD-10-CM | POA: Diagnosis not present

## 2021-06-06 DIAGNOSIS — J45909 Unspecified asthma, uncomplicated: Secondary | ICD-10-CM | POA: Diagnosis not present

## 2021-06-06 DIAGNOSIS — M5136 Other intervertebral disc degeneration, lumbar region: Secondary | ICD-10-CM | POA: Diagnosis not present

## 2021-06-06 DIAGNOSIS — Z96641 Presence of right artificial hip joint: Secondary | ICD-10-CM | POA: Diagnosis not present

## 2021-06-09 DIAGNOSIS — M961 Postlaminectomy syndrome, not elsewhere classified: Secondary | ICD-10-CM | POA: Diagnosis not present

## 2021-06-09 DIAGNOSIS — G8929 Other chronic pain: Secondary | ICD-10-CM | POA: Diagnosis not present

## 2021-06-09 DIAGNOSIS — M5136 Other intervertebral disc degeneration, lumbar region: Secondary | ICD-10-CM | POA: Diagnosis not present

## 2021-06-09 DIAGNOSIS — E1169 Type 2 diabetes mellitus with other specified complication: Secondary | ICD-10-CM | POA: Diagnosis not present

## 2021-06-09 DIAGNOSIS — G43909 Migraine, unspecified, not intractable, without status migrainosus: Secondary | ICD-10-CM | POA: Diagnosis not present

## 2021-06-09 DIAGNOSIS — Z96641 Presence of right artificial hip joint: Secondary | ICD-10-CM | POA: Diagnosis not present

## 2021-06-09 DIAGNOSIS — I2510 Atherosclerotic heart disease of native coronary artery without angina pectoris: Secondary | ICD-10-CM | POA: Diagnosis not present

## 2021-06-09 DIAGNOSIS — I10 Essential (primary) hypertension: Secondary | ICD-10-CM | POA: Diagnosis not present

## 2021-06-09 DIAGNOSIS — Z96651 Presence of right artificial knee joint: Secondary | ICD-10-CM | POA: Diagnosis not present

## 2021-06-09 DIAGNOSIS — J439 Emphysema, unspecified: Secondary | ICD-10-CM | POA: Diagnosis not present

## 2021-06-09 DIAGNOSIS — K219 Gastro-esophageal reflux disease without esophagitis: Secondary | ICD-10-CM | POA: Diagnosis not present

## 2021-06-09 DIAGNOSIS — E785 Hyperlipidemia, unspecified: Secondary | ICD-10-CM | POA: Diagnosis not present

## 2021-06-09 DIAGNOSIS — M5033 Other cervical disc degeneration, cervicothoracic region: Secondary | ICD-10-CM | POA: Diagnosis not present

## 2021-06-09 DIAGNOSIS — E1142 Type 2 diabetes mellitus with diabetic polyneuropathy: Secondary | ICD-10-CM | POA: Diagnosis not present

## 2021-06-09 DIAGNOSIS — Z8505 Personal history of malignant neoplasm of liver: Secondary | ICD-10-CM | POA: Diagnosis not present

## 2021-06-09 DIAGNOSIS — Z471 Aftercare following joint replacement surgery: Secondary | ICD-10-CM | POA: Diagnosis not present

## 2021-06-09 DIAGNOSIS — J45909 Unspecified asthma, uncomplicated: Secondary | ICD-10-CM | POA: Diagnosis not present

## 2021-06-09 DIAGNOSIS — G4733 Obstructive sleep apnea (adult) (pediatric): Secondary | ICD-10-CM | POA: Diagnosis not present

## 2021-06-09 DIAGNOSIS — I6529 Occlusion and stenosis of unspecified carotid artery: Secondary | ICD-10-CM | POA: Diagnosis not present

## 2021-06-09 DIAGNOSIS — G4700 Insomnia, unspecified: Secondary | ICD-10-CM | POA: Diagnosis not present

## 2021-06-11 DIAGNOSIS — G4733 Obstructive sleep apnea (adult) (pediatric): Secondary | ICD-10-CM | POA: Diagnosis not present

## 2021-06-11 DIAGNOSIS — J45909 Unspecified asthma, uncomplicated: Secondary | ICD-10-CM | POA: Diagnosis not present

## 2021-06-11 DIAGNOSIS — G43909 Migraine, unspecified, not intractable, without status migrainosus: Secondary | ICD-10-CM | POA: Diagnosis not present

## 2021-06-11 DIAGNOSIS — G4700 Insomnia, unspecified: Secondary | ICD-10-CM | POA: Diagnosis not present

## 2021-06-11 DIAGNOSIS — M961 Postlaminectomy syndrome, not elsewhere classified: Secondary | ICD-10-CM | POA: Diagnosis not present

## 2021-06-11 DIAGNOSIS — I2510 Atherosclerotic heart disease of native coronary artery without angina pectoris: Secondary | ICD-10-CM | POA: Diagnosis not present

## 2021-06-11 DIAGNOSIS — K219 Gastro-esophageal reflux disease without esophagitis: Secondary | ICD-10-CM | POA: Diagnosis not present

## 2021-06-11 DIAGNOSIS — Z471 Aftercare following joint replacement surgery: Secondary | ICD-10-CM | POA: Diagnosis not present

## 2021-06-11 DIAGNOSIS — E785 Hyperlipidemia, unspecified: Secondary | ICD-10-CM | POA: Diagnosis not present

## 2021-06-11 DIAGNOSIS — E1169 Type 2 diabetes mellitus with other specified complication: Secondary | ICD-10-CM | POA: Diagnosis not present

## 2021-06-11 DIAGNOSIS — M5136 Other intervertebral disc degeneration, lumbar region: Secondary | ICD-10-CM | POA: Diagnosis not present

## 2021-06-11 DIAGNOSIS — Z8505 Personal history of malignant neoplasm of liver: Secondary | ICD-10-CM | POA: Diagnosis not present

## 2021-06-11 DIAGNOSIS — G894 Chronic pain syndrome: Secondary | ICD-10-CM | POA: Diagnosis not present

## 2021-06-11 DIAGNOSIS — I10 Essential (primary) hypertension: Secondary | ICD-10-CM | POA: Diagnosis not present

## 2021-06-11 DIAGNOSIS — Z96651 Presence of right artificial knee joint: Secondary | ICD-10-CM | POA: Diagnosis not present

## 2021-06-11 DIAGNOSIS — G8929 Other chronic pain: Secondary | ICD-10-CM | POA: Diagnosis not present

## 2021-06-11 DIAGNOSIS — J439 Emphysema, unspecified: Secondary | ICD-10-CM | POA: Diagnosis not present

## 2021-06-11 DIAGNOSIS — M5033 Other cervical disc degeneration, cervicothoracic region: Secondary | ICD-10-CM | POA: Diagnosis not present

## 2021-06-11 DIAGNOSIS — M545 Low back pain, unspecified: Secondary | ICD-10-CM | POA: Diagnosis not present

## 2021-06-11 DIAGNOSIS — Z96641 Presence of right artificial hip joint: Secondary | ICD-10-CM | POA: Diagnosis not present

## 2021-06-11 DIAGNOSIS — M179 Osteoarthritis of knee, unspecified: Secondary | ICD-10-CM | POA: Diagnosis not present

## 2021-06-11 DIAGNOSIS — I6529 Occlusion and stenosis of unspecified carotid artery: Secondary | ICD-10-CM | POA: Diagnosis not present

## 2021-06-11 DIAGNOSIS — E1142 Type 2 diabetes mellitus with diabetic polyneuropathy: Secondary | ICD-10-CM | POA: Diagnosis not present

## 2021-06-16 ENCOUNTER — Other Ambulatory Visit: Payer: Self-pay | Admitting: Nurse Practitioner

## 2021-06-16 NOTE — Telephone Encounter (Signed)
Requested medication (s) are due for refill today - no  Requested medication (s) are on the active medication list -no  Future visit scheduled -no  Last refill: 01/23/21  Notes to clinic: Request RF- discontinued Rx- sent for review   Requested Prescriptions  Pending Prescriptions Disp Refills   pantoprazole (PROTONIX) 40 MG tablet [Pharmacy Med Name: Pantoprazole Sodium 40 MG Oral Tablet Delayed Release] 180 tablet 0    Sig: Take 1 tablet by mouth twice daily     Gastroenterology: Proton Pump Inhibitors Passed - 06/16/2021  3:44 PM      Passed - Valid encounter within last 12 months    Recent Outpatient Visits           2 months ago Generalized abdominal pain   [place], Jolene T, NP   3 months ago Uncontrolled type 2 diabetes mellitus with hyperglycemia ([place])   [facility] [place], Jolene T, NP   6 months ago Uncontrolled type 2 diabetes mellitus with hyperglycemia ([place])   [facility] [place], Selma T, NP   7 months ago Lab test positive for detection of COVID-19 virus   [facility], Santiago Glad, NP   9 months ago Uncontrolled type 2 diabetes mellitus with hyperglycemia ([place])   [place], Barbaraann Faster, NP       Future Appointments             Tomorrow Laurin Coder, MD [facility]               Requested Prescriptions  Pending Prescriptions Disp Refills   pantoprazole (PROTONIX) 40 MG tablet [Pharmacy Med Name: Pantoprazole Sodium 40 MG Oral Tablet Delayed Release] 180 tablet 0    Sig: Take 1 tablet by mouth twice daily     Gastroenterology: Proton Pump Inhibitors Passed - 06/16/2021  3:44 PM      Passed - Valid encounter within last 12 months    Recent Outpatient Visits           2 months ago Generalized abdominal pain   [place], Jolene T, NP   3 months ago Uncontrolled type 2 diabetes mellitus with hyperglycemia ([place])   [place], Jolene T, NP   6 months ago Uncontrolled type 2 diabetes mellitus with hyperglycemia ([place])   [place], [place] T, NP   7 months ago Lab test positive for detection of COVID-19 virus   [facility] [place], Santiago Glad, NP   9 months ago Uncontrolled type 2 diabetes mellitus with hyperglycemia ([place])   [place], Barbaraann Faster, NP       Future Appointments             Tomorrow Laurin Coder, MD [facility]

## 2021-06-17 ENCOUNTER — Encounter: Payer: Self-pay | Admitting: Pulmonary Disease

## 2021-06-17 ENCOUNTER — Other Ambulatory Visit: Payer: Self-pay

## 2021-06-17 ENCOUNTER — Ambulatory Visit: Payer: Medicare Other | Admitting: Pulmonary Disease

## 2021-06-17 VITALS — BP 122/78 | HR 86 | Temp 97.3°F | Ht 68.0 in | Wt 238.8 lb

## 2021-06-17 DIAGNOSIS — J449 Chronic obstructive pulmonary disease, unspecified: Secondary | ICD-10-CM

## 2021-06-17 DIAGNOSIS — Z9989 Dependence on other enabling machines and devices: Secondary | ICD-10-CM | POA: Diagnosis not present

## 2021-06-17 DIAGNOSIS — R0602 Shortness of breath: Secondary | ICD-10-CM

## 2021-06-17 DIAGNOSIS — G4733 Obstructive sleep apnea (adult) (pediatric): Secondary | ICD-10-CM

## 2021-06-17 NOTE — Patient Instructions (Signed)
COPD -Well-controlled -Continue inhalers  Obstructive sleep apnea Good compliance with CPAP -No changes need made -Continue CPAP  Exercise as tolerated  Call with any significant concerns  I will see you in 6 months
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
## 2021-06-18 DIAGNOSIS — J45909 Unspecified asthma, uncomplicated: Secondary | ICD-10-CM | POA: Diagnosis not present

## 2021-06-18 DIAGNOSIS — G4733 Obstructive sleep apnea (adult) (pediatric): Secondary | ICD-10-CM | POA: Diagnosis not present

## 2021-06-18 DIAGNOSIS — E1169 Type 2 diabetes mellitus with other specified complication: Secondary | ICD-10-CM | POA: Diagnosis not present

## 2021-06-18 DIAGNOSIS — M961 Postlaminectomy syndrome, not elsewhere classified: Secondary | ICD-10-CM | POA: Diagnosis not present

## 2021-06-18 DIAGNOSIS — M5033 Other cervical disc degeneration, cervicothoracic region: Secondary | ICD-10-CM | POA: Diagnosis not present

## 2021-06-18 DIAGNOSIS — E1142 Type 2 diabetes mellitus with diabetic polyneuropathy: Secondary | ICD-10-CM | POA: Diagnosis not present

## 2021-06-18 DIAGNOSIS — J439 Emphysema, unspecified: Secondary | ICD-10-CM | POA: Diagnosis not present

## 2021-06-18 DIAGNOSIS — Z8505 Personal history of malignant neoplasm of liver: Secondary | ICD-10-CM | POA: Diagnosis not present

## 2021-06-18 DIAGNOSIS — Z96651 Presence of right artificial knee joint: Secondary | ICD-10-CM | POA: Diagnosis not present

## 2021-06-18 DIAGNOSIS — Z471 Aftercare following joint replacement surgery: Secondary | ICD-10-CM | POA: Diagnosis not present

## 2021-06-18 DIAGNOSIS — G43909 Migraine, unspecified, not intractable, without status migrainosus: Secondary | ICD-10-CM | POA: Diagnosis not present

## 2021-06-18 DIAGNOSIS — I2510 Atherosclerotic heart disease of native coronary artery without angina pectoris: Secondary | ICD-10-CM | POA: Diagnosis not present

## 2021-06-18 DIAGNOSIS — G8929 Other chronic pain: Secondary | ICD-10-CM | POA: Diagnosis not present

## 2021-06-18 DIAGNOSIS — E785 Hyperlipidemia, unspecified: Secondary | ICD-10-CM | POA: Diagnosis not present

## 2021-06-18 DIAGNOSIS — I6529 Occlusion and stenosis of unspecified carotid artery: Secondary | ICD-10-CM | POA: Diagnosis not present

## 2021-06-18 DIAGNOSIS — I10 Essential (primary) hypertension: Secondary | ICD-10-CM | POA: Diagnosis not present

## 2021-06-18 DIAGNOSIS — K219 Gastro-esophageal reflux disease without esophagitis: Secondary | ICD-10-CM | POA: Diagnosis not present

## 2021-06-18 DIAGNOSIS — G4700 Insomnia, unspecified: Secondary | ICD-10-CM | POA: Diagnosis not present

## 2021-06-18 DIAGNOSIS — M5136 Other intervertebral disc degeneration, lumbar region: Secondary | ICD-10-CM | POA: Diagnosis not present

## 2021-06-18 DIAGNOSIS — Z96641 Presence of right artificial hip joint: Secondary | ICD-10-CM | POA: Diagnosis not present

## 2021-06-20 DIAGNOSIS — Z471 Aftercare following joint replacement surgery: Secondary | ICD-10-CM | POA: Diagnosis not present

## 2021-06-20 DIAGNOSIS — K219 Gastro-esophageal reflux disease without esophagitis: Secondary | ICD-10-CM | POA: Diagnosis not present

## 2021-06-20 DIAGNOSIS — I2510 Atherosclerotic heart disease of native coronary artery without angina pectoris: Secondary | ICD-10-CM | POA: Diagnosis not present

## 2021-06-20 DIAGNOSIS — E785 Hyperlipidemia, unspecified: Secondary | ICD-10-CM | POA: Diagnosis not present

## 2021-06-20 DIAGNOSIS — Z96651 Presence of right artificial knee joint: Secondary | ICD-10-CM | POA: Diagnosis not present

## 2021-06-20 DIAGNOSIS — M5136 Other intervertebral disc degeneration, lumbar region: Secondary | ICD-10-CM | POA: Diagnosis not present

## 2021-06-20 DIAGNOSIS — G43909 Migraine, unspecified, not intractable, without status migrainosus: Secondary | ICD-10-CM | POA: Diagnosis not present

## 2021-06-20 DIAGNOSIS — I6529 Occlusion and stenosis of unspecified carotid artery: Secondary | ICD-10-CM | POA: Diagnosis not present

## 2021-06-20 DIAGNOSIS — G8929 Other chronic pain: Secondary | ICD-10-CM | POA: Diagnosis not present

## 2021-06-20 DIAGNOSIS — J439 Emphysema, unspecified: Secondary | ICD-10-CM | POA: Diagnosis not present

## 2021-06-20 DIAGNOSIS — J45909 Unspecified asthma, uncomplicated: Secondary | ICD-10-CM | POA: Diagnosis not present

## 2021-06-20 DIAGNOSIS — I10 Essential (primary) hypertension: Secondary | ICD-10-CM | POA: Diagnosis not present

## 2021-06-20 DIAGNOSIS — Z8505 Personal history of malignant neoplasm of liver: Secondary | ICD-10-CM | POA: Diagnosis not present

## 2021-06-20 DIAGNOSIS — Z96641 Presence of right artificial hip joint: Secondary | ICD-10-CM | POA: Diagnosis not present

## 2021-06-20 DIAGNOSIS — E1142 Type 2 diabetes mellitus with diabetic polyneuropathy: Secondary | ICD-10-CM | POA: Diagnosis not present

## 2021-06-20 DIAGNOSIS — M5033 Other cervical disc degeneration, cervicothoracic region: Secondary | ICD-10-CM | POA: Diagnosis not present

## 2021-06-20 DIAGNOSIS — M961 Postlaminectomy syndrome, not elsewhere classified: Secondary | ICD-10-CM | POA: Diagnosis not present

## 2021-06-20 DIAGNOSIS — E1169 Type 2 diabetes mellitus with other specified complication: Secondary | ICD-10-CM | POA: Diagnosis not present

## 2021-06-20 DIAGNOSIS — G4700 Insomnia, unspecified: Secondary | ICD-10-CM | POA: Diagnosis not present

## 2021-06-20 DIAGNOSIS — G4733 Obstructive sleep apnea (adult) (pediatric): Secondary | ICD-10-CM | POA: Diagnosis not present

## 2021-06-24 DIAGNOSIS — J439 Emphysema, unspecified: Secondary | ICD-10-CM | POA: Diagnosis not present

## 2021-06-24 DIAGNOSIS — K219 Gastro-esophageal reflux disease without esophagitis: Secondary | ICD-10-CM | POA: Diagnosis not present

## 2021-06-24 DIAGNOSIS — Z96651 Presence of right artificial knee joint: Secondary | ICD-10-CM | POA: Diagnosis not present

## 2021-06-24 DIAGNOSIS — E1142 Type 2 diabetes mellitus with diabetic polyneuropathy: Secondary | ICD-10-CM | POA: Diagnosis not present

## 2021-06-24 DIAGNOSIS — E785 Hyperlipidemia, unspecified: Secondary | ICD-10-CM | POA: Diagnosis not present

## 2021-06-24 DIAGNOSIS — J45909 Unspecified asthma, uncomplicated: Secondary | ICD-10-CM | POA: Diagnosis not present

## 2021-06-24 DIAGNOSIS — I2510 Atherosclerotic heart disease of native coronary artery without angina pectoris: Secondary | ICD-10-CM | POA: Diagnosis not present

## 2021-06-24 DIAGNOSIS — Z8505 Personal history of malignant neoplasm of liver: Secondary | ICD-10-CM | POA: Diagnosis not present

## 2021-06-24 DIAGNOSIS — E1169 Type 2 diabetes mellitus with other specified complication: Secondary | ICD-10-CM | POA: Diagnosis not present

## 2021-06-24 DIAGNOSIS — I10 Essential (primary) hypertension: Secondary | ICD-10-CM | POA: Diagnosis not present

## 2021-06-24 DIAGNOSIS — M5136 Other intervertebral disc degeneration, lumbar region: Secondary | ICD-10-CM | POA: Diagnosis not present

## 2021-06-24 DIAGNOSIS — Z96641 Presence of right artificial hip joint: Secondary | ICD-10-CM | POA: Diagnosis not present

## 2021-06-24 DIAGNOSIS — G8929 Other chronic pain: Secondary | ICD-10-CM | POA: Diagnosis not present

## 2021-06-24 DIAGNOSIS — Z471 Aftercare following joint replacement surgery: Secondary | ICD-10-CM | POA: Diagnosis not present

## 2021-06-24 DIAGNOSIS — G4733 Obstructive sleep apnea (adult) (pediatric): Secondary | ICD-10-CM | POA: Diagnosis not present

## 2021-06-24 DIAGNOSIS — G43909 Migraine, unspecified, not intractable, without status migrainosus: Secondary | ICD-10-CM | POA: Diagnosis not present

## 2021-06-24 DIAGNOSIS — I6529 Occlusion and stenosis of unspecified carotid artery: Secondary | ICD-10-CM | POA: Diagnosis not present

## 2021-06-24 DIAGNOSIS — M961 Postlaminectomy syndrome, not elsewhere classified: Secondary | ICD-10-CM | POA: Diagnosis not present

## 2021-06-24 DIAGNOSIS — M5033 Other cervical disc degeneration, cervicothoracic region: Secondary | ICD-10-CM | POA: Diagnosis not present

## 2021-06-24 DIAGNOSIS — G4700 Insomnia, unspecified: Secondary | ICD-10-CM | POA: Diagnosis not present

## 2021-06-26 DIAGNOSIS — J45909 Unspecified asthma, uncomplicated: Secondary | ICD-10-CM | POA: Diagnosis not present

## 2021-06-26 DIAGNOSIS — G4733 Obstructive sleep apnea (adult) (pediatric): Secondary | ICD-10-CM | POA: Diagnosis not present

## 2021-06-26 DIAGNOSIS — M5136 Other intervertebral disc degeneration, lumbar region: Secondary | ICD-10-CM | POA: Diagnosis not present

## 2021-06-26 DIAGNOSIS — G8929 Other chronic pain: Secondary | ICD-10-CM | POA: Diagnosis not present

## 2021-06-26 DIAGNOSIS — J439 Emphysema, unspecified: Secondary | ICD-10-CM | POA: Diagnosis not present

## 2021-06-26 DIAGNOSIS — K219 Gastro-esophageal reflux disease without esophagitis: Secondary | ICD-10-CM | POA: Diagnosis not present

## 2021-06-26 DIAGNOSIS — G4700 Insomnia, unspecified: Secondary | ICD-10-CM | POA: Diagnosis not present

## 2021-06-26 DIAGNOSIS — E1142 Type 2 diabetes mellitus with diabetic polyneuropathy: Secondary | ICD-10-CM | POA: Diagnosis not present

## 2021-06-26 DIAGNOSIS — M5033 Other cervical disc degeneration, cervicothoracic region: Secondary | ICD-10-CM | POA: Diagnosis not present

## 2021-06-26 DIAGNOSIS — G43909 Migraine, unspecified, not intractable, without status migrainosus: Secondary | ICD-10-CM | POA: Diagnosis not present

## 2021-06-26 DIAGNOSIS — E785 Hyperlipidemia, unspecified: Secondary | ICD-10-CM | POA: Diagnosis not present

## 2021-06-26 DIAGNOSIS — Z96641 Presence of right artificial hip joint: Secondary | ICD-10-CM | POA: Diagnosis not present

## 2021-06-26 DIAGNOSIS — Z8505 Personal history of malignant neoplasm of liver: Secondary | ICD-10-CM | POA: Diagnosis not present

## 2021-06-26 DIAGNOSIS — Z471 Aftercare following joint replacement surgery: Secondary | ICD-10-CM | POA: Diagnosis not present

## 2021-06-26 DIAGNOSIS — M961 Postlaminectomy syndrome, not elsewhere classified: Secondary | ICD-10-CM | POA: Diagnosis not present

## 2021-06-26 DIAGNOSIS — I2510 Atherosclerotic heart disease of native coronary artery without angina pectoris: Secondary | ICD-10-CM | POA: Diagnosis not present

## 2021-06-26 DIAGNOSIS — E1169 Type 2 diabetes mellitus with other specified complication: Secondary | ICD-10-CM | POA: Diagnosis not present

## 2021-06-26 DIAGNOSIS — I6529 Occlusion and stenosis of unspecified carotid artery: Secondary | ICD-10-CM | POA: Diagnosis not present

## 2021-06-26 DIAGNOSIS — I10 Essential (primary) hypertension: Secondary | ICD-10-CM | POA: Diagnosis not present

## 2021-06-26 DIAGNOSIS — Z96651 Presence of right artificial knee joint: Secondary | ICD-10-CM | POA: Diagnosis not present

## 2021-06-27 ENCOUNTER — Other Ambulatory Visit: Payer: Self-pay | Admitting: Internal Medicine

## 2021-06-30 ENCOUNTER — Other Ambulatory Visit: Payer: Self-pay | Admitting: Internal Medicine

## 2021-06-30 ENCOUNTER — Other Ambulatory Visit: Payer: Self-pay | Admitting: Orthopedic Surgery

## 2021-06-30 ENCOUNTER — Ambulatory Visit
Admission: RE | Admit: 2021-06-30 | Discharge: 2021-06-30 | Disposition: A | Discharge status: Home / Self Care | Payer: Medicare Other | Source: Ambulatory Visit | Attending: Orthopedic Surgery | Admitting: Orthopedic Surgery

## 2021-06-30 ENCOUNTER — Other Ambulatory Visit: Payer: Self-pay

## 2021-06-30 DIAGNOSIS — I82461 Acute embolism and thrombosis of right calf muscular vein: Secondary | ICD-10-CM | POA: Insufficient documentation

## 2021-06-30 DIAGNOSIS — Z96641 Presence of right artificial hip joint: Secondary | ICD-10-CM | POA: Diagnosis not present

## 2021-06-30 DIAGNOSIS — M1611 Unilateral primary osteoarthritis, right hip: Secondary | ICD-10-CM | POA: Diagnosis not present

## 2021-06-30 DIAGNOSIS — M79604 Pain in right leg: Secondary | ICD-10-CM | POA: Diagnosis not present

## 2021-07-05 ENCOUNTER — Ambulatory Visit (INDEPENDENT_AMBULATORY_CARE_PROVIDER_SITE_OTHER): Payer: Medicare Other

## 2021-07-05 DIAGNOSIS — Z0000 Encounter for general adult medical examination without abnormal findings: Secondary | ICD-10-CM | POA: Diagnosis not present

## 2021-07-05 NOTE — Patient Instructions (Signed)
Health Maintenance, Male Adopting a healthy lifestyle and getting preventive care are important in promoting health and wellness. Ask your health care provider about: The right schedule for you to have regular tests and exams. Things you can do on your own to prevent diseases and keep yourself healthy. What should I know about diet, weight, and exercise? Eat a healthy diet  Eat a diet that includes plenty of vegetables, fruits, low-fat dairy products, and lean protein. Do not eat a lot of foods that are high in solid fats, added sugars, or sodium. Maintain a healthy weight Body mass index (BMI) is a measurement that can be used to identify possible weight problems. It estimates body fat based on height and weight. Your health care provider can help determine your BMI and help you achieve or maintain a healthy weight. Get regular exercise Get regular exercise. This is one of the most important things you can do for your health. Most adults should: Exercise for at least 150 minutes each week. The exercise should increase your heart rate and make you sweat (moderate-intensity exercise). Do strengthening exercises at least twice a week. This is in addition to the moderate-intensity exercise. Spend less time sitting. Even light physical activity can be beneficial. Watch cholesterol and blood lipids Have your blood tested for lipids and cholesterol at 64 years of age, then have this test every 5 years. You may need to have your cholesterol levels checked more often if: Your lipid or cholesterol levels are high. You are older than 64 years of age. You are at high risk for heart disease. What should I know about cancer screening? Many types of cancers can be detected early and may often be prevented. Depending on your health history and family history, you may need to have cancer screening at various ages. This may include screening for: Colorectal cancer. Prostate cancer. Skin cancer. Lung  cancer. What should I know about heart disease, diabetes, and high blood pressure? Blood pressure and heart disease High blood pressure causes heart disease and increases the risk of stroke. This is more likely to develop in people who have high blood pressure readings, are of African descent, or are overweight. Talk with your health care provider about your target blood pressure readings. Have your blood pressure checked: Every 3-5 years if you are 18-39 years of age. Every year if you are 40 years old or older. If you are between the ages of 65 and 75 and are a current or former smoker, ask your health care provider if you should have a one-time screening for abdominal aortic aneurysm (AAA). Diabetes Have regular diabetes screenings. This checks your fasting blood sugar level. Have the screening done: Once every three years after age 45 if you are at a normal weight and have a low risk for diabetes. More often and at a younger age if you are overweight or have a high risk for diabetes. What should I know about preventing infection? Hepatitis B If you have a higher risk for hepatitis B, you should be screened for this virus. Talk with your health care provider to find out if you are at risk for hepatitis B infection. Hepatitis C Blood testing is recommended for: Everyone born from 1945 through 1965. Anyone with known risk factors for hepatitis C. Sexually transmitted infections (STIs) You should be screened each year for STIs, including gonorrhea and chlamydia, if: You are sexually active and are younger than 64 years of age. You are older than 64 years   of age and your health care provider tells you that you are at risk for this type of infection. Your sexual activity has changed since you were last screened, and you are at increased risk for chlamydia or gonorrhea. Ask your health care provider if you are at risk. Ask your health care provider about whether you are at high risk for HIV.  Your health care provider may recommend a prescription medicine to help prevent HIV infection. If you choose to take medicine to prevent HIV, you should first get tested for HIV. You should then be tested every 3 months for as long as you are taking the medicine. Follow these instructions at home: Lifestyle Do not use any products that contain nicotine or tobacco, such as cigarettes, e-cigarettes, and chewing tobacco. If you need help quitting, ask your health care provider. Do not use street drugs. Do not share needles. Ask your health care provider for help if you need support or information about quitting drugs. Alcohol use Do not drink alcohol if your health care provider tells you not to drink. If you drink alcohol: Limit how much you have to 0-2 drinks a day. Be aware of how much alcohol is in your drink. In the [place] one drink equals one 12 oz bottle of beer (355 mL), one 5 oz glass of wine (148 mL), or one 1 oz glass of hard liquor (44 mL). General instructions Schedule regular health, dental, and eye exams. Stay current with your vaccines. Tell your health care provider if: You often feel depressed. You have ever been abused or do not feel safe at home. Summary Adopting a healthy lifestyle and getting preventive care are important in promoting health and wellness. Follow your health care provider's instructions about healthy diet, exercising, and getting tested or screened for diseases. Follow your health care provider's instructions on monitoring your cholesterol and blood pressure. This information is not intended to replace advice given to you by your health care provider. Make sure you discuss any questions you have with your health care provider. Document Revised: 12/13/2020 Document Reviewed: 09/28/2018 Elsevier Patient Education  2022 [employer]  

## 2021-07-05 NOTE — Progress Notes (Signed)
Subjective:   Edward Schneider. is a 65 y.o. male who presents for Medicare Annual/Subsequent preventive examination.  I connected with  Edward Schneider. on 07/05/21 by an audio only telemedicine application and verified that I am speaking with the correct person using two identifiers.   I discussed the limitations, risks, security and privacy concerns of performing an evaluation and management service by telephone and the availability of in person appointments. I also discussed with the patient that there may be a patient responsible charge related to this service. The patient expressed understanding and verbally consented to this telephonic visit.  Location of Patient: home Location of Provider: office  List any persons and their role that are participating in the visit with the patient.    Review of Systems    Defer to PCP.  Cardiac Risk Factors include: diabetes mellitus;male gender;hypertension     Objective:    There were no vitals filed for this visit. There is no height or weight on file to calculate BMI.  Advanced Directives 07/05/2021 05/27/2021 06/18/2020 05/29/2020 05/29/2020 05/29/2020 05/17/2020  Does Patient Have a Medical Advance Directive? No No No Yes No No No  Type of Advance Directive - - - [place]  Does patient want to make changes to medical advance directive? - - - Yes (Inpatient - patient requests chaplain consult to change a medical advance directive) - - -  Would patient like information on creating a medical advance directive? No - Patient declined No - Patient declined No - Patient declined Yes (Inpatient - patient requests chaplain consult to create a medical advance directive) Yes (Inpatient - patient requests chaplain consult to create a medical advance directive) - No - Patient declined    Current Medications (verified) Outpatient Encounter Medications as of 07/05/2021  Medication Sig   acetaminophen (TYLENOL) 650 MG CR tablet  Take 1,300 mg by mouth every 8 (eight) hours as needed for pain.   albuterol (VENTOLIN HFA) 108 (90 Base) MCG/ACT inhaler Inhale 2 puffs into the lungs every 6 (six) hours as needed for wheezing or shortness of breath.   Ascorbic Acid (VITAMIN C) 1000 MG tablet Take 1,000 mg by mouth daily.    atorvastatin (LIPITOR) 80 MG tablet TAKE 1 TABLET BY MOUTH AT BEDTIME   benazepril (LOTENSIN) 10 MG tablet Take 1 tablet (10 mg total) by mouth daily.   calcium carbonate (OS-CAL) 600 MG TABS tablet Take 600 mg by mouth daily.    cephALEXin (KEFLEX) 500 MG capsule Take 500 mg by mouth 2 (two) times daily.   cetirizine (ZYRTEC) 10 MG tablet Take 10 mg by mouth daily.   Cholecalciferol (VITAMIN D3) 25 MCG (1000 UT) CHEW Chew 1,000 Units by mouth daily.    CINNAMON PO Take 1,000 mg by mouth 2 (two) times daily.    clobetasol cream (TEMOVATE) 3.21 % Apply 1 application topically 2 (two) times daily.   Continuous Blood Gluc Sensor (FREESTYLE LIBRE 2 SENSOR) MISC Use as instructed to check blood sugar daily   cyclobenzaprine (FLEXERIL) 10 MG tablet Take 1 tablet (10 mg total) by mouth 3 (three) times daily as needed for muscle spasms.   dapagliflozin propanediol (FARXIGA) 5 MG TABS tablet Take 1 tablet (5 mg total) by mouth daily.   diclofenac Sodium (VOLTAREN) 1 % GEL Apply 2 g topically 4 (four) times daily as needed (pain).    diphenhydrAMINE (BENADRYL) 25 mg capsule Take 25 mg by mouth in the morning,  at noon, and at bedtime.   docusate sodium (COLACE) 100 MG capsule Take 1 capsule (100 mg total) by mouth 2 (two) times daily.   doxepin (SINEQUAN) 25 MG capsule Take 50 mg by mouth at bedtime.    doxycycline (VIBRAMYCIN) 100 MG capsule Take 100 mg by mouth 2 (two) times daily.   Dulaglutide (TRULICITY) 1.5 OE/7.0JJ SOPN Inject 1.5 mg into the skin once a week.   DULoxetine (CYMBALTA) 30 MG capsule Take 60 mg by mouth daily.   DULoxetine (CYMBALTA) 60 MG capsule Take 1 capsule (60 mg total) by mouth at bedtime.    ezetimibe (ZETIA) 10 MG tablet Take 1 tablet by mouth once daily   Ginger, Zingiber officinalis, (GINGER PO) Take 1 Dose by mouth daily.   Ginseng 100 MG CAPS Take 100 mg by mouth daily.    Insulin Glargine (BASAGLAR KWIKPEN) 100 UNIT/ML Inject 32 Units into the skin at bedtime.   insulin lispro (HUMALOG KWIKPEN) 200 UNIT/ML KwikPen Inject into the skin. Injecting 22 units with Breakfast, 16 units with lunch and supper   isosorbide mononitrate (IMDUR) 60 MG 24 hr tablet Take 1 tablet (60 mg total) by mouth daily.   methocarbamol (ROBAXIN) 500 MG tablet Take 1 tablet (500 mg total) by mouth every 6 (six) hours as needed for muscle spasms.   Multiple Vitamins-Minerals (CENTRUM SILVER 50+MEN) TABS Take 1 tablet by mouth daily.   mupirocin ointment (BACTROBAN) 2 % Apply 1 application topically daily.    NARCAN 4 MG/0.1ML LIQD nasal spray kit Place 0.4 mg into the nose as needed (opioid overdose).    nitroGLYCERIN (NITROSTAT) 0.4 MG SL tablet Place 0.4 mg under the tongue every 5 (five) minutes as needed for chest pain.    Omega-3 1000 MG CAPS Take 1,000 mg by mouth daily.    oxyCODONE 10 MG TABS Take 1-1.5 tablets (10-15 mg total) by mouth every 4 (four) hours as needed for severe pain (pain score 7-10).   pantoprazole (PROTONIX) 40 MG tablet Take 1 tablet by mouth twice daily   pregabalin (LYRICA) 50 MG capsule TAKE 1 CAPSULE BY MOUTH THREE TIMES DAILY   Rimegepant Sulfate (NURTEC) 75 MG TBDP Take one tablet (75 MG) by mouth every 24 hours as needed only for migraine.   senna-docusate (SENOKOT-S) 8.6-50 MG tablet Take 1 tablet by mouth at bedtime as needed for mild constipation.   sucralfate (CARAFATE) 1 g tablet TAKE 1 TABLET BY MOUTH 4 TIMES DAILY WITH MEALS AND AT BEDTIME   Tiotropium Bromide-Olodaterol (STIOLTO RESPIMAT) 2.5-2.5 MCG/ACT AERS Inhale 2 puffs into the lungs daily as needed (shortness of breath).   traMADol (ULTRAM) 50 MG tablet Take 1 tablet (50 mg total) by mouth every 6 (six)  hours as needed.   traZODone (DESYREL) 100 MG tablet Take 1 tablet (100 mg total) by mouth at bedtime as needed for sleep.   triamcinolone cream (KENALOG) 0.1 % Apply 1 application topically 2 (two) times daily.   vitamin A 10000 UNIT capsule Take 2,400 Units by mouth daily.   vitamin B-12 (CYANOCOBALAMIN) 1000 MCG tablet Take 500 mcg by mouth daily.   Vitamin E 400 units TABS Take 400 Units by mouth daily.    enoxaparin (LOVENOX) 40 MG/0.4ML injection Inject 0.4 mLs (40 mg total) into the skin daily for 14 days.   No facility-administered encounter medications on file as of 07/05/2021.    Allergies (verified) Bee venom, Crestor [rosuvastatin calcium], Fentanyl, Gabapentin, Shellfish allergy, Furosemide, Buprenorphine hcl, and Simvastatin   History:  Past Medical History:  Diagnosis Date   Allergy    Aortic atherosclerosis (HCC)    Asthma    C. difficile diarrhea    Chronic pain    Collagenous colitis    Coronary artery disease    a.) PCI with 2.75 x 18 mm Resolute Onyx DES x 1 to prox/mid LAD on 09/05/2019   DDD (degenerative disc disease), cervical    DDD (degenerative disc disease), lumbar    GERD (gastroesophageal reflux disease)    Grade I diastolic dysfunction    Hepatic steatosis    Hyperlipidemia    Hypertension    Liver cancer ([place]) 03/2015   Migraines    Myocardial infarction (HCC)     OSA on CPAP    Seizures ([place])    several as child when sick.  None since age 37   Stroke [facility])    'mini-stroke" 30 yrs ago. no deficits.   T2DM (type 2 diabetes mellitus) ([place])    Wears dentures    full upper and lower   Past Surgical History:  Procedure Laterality Date   APPENDECTOMY     BACK SURGERY     CARDIAC CATHETERIZATION     No stent placed in his "67's"   CERVICAL FUSION     COLONOSCOPY WITH PROPOFOL N/A 03/06/2016   Procedure: COLONOSCOPY WITH PROPOFOL;  Surgeon: Lucilla Lame, MD;  Location: [place];  Service: Endoscopy;  Laterality: N/A;  requests early    COLONOSCOPY WITH PROPOFOL N/A 06/18/2020   Procedure: COLONOSCOPY WITH PROPOFOL;  Surgeon: Lucilla Lame, MD;  Location[facility] ENDOSCOPY;  Service: Endoscopy;  Laterality: N/A;   ESOPHAGOGASTRODUODENOSCOPY (EGD) WITH PROPOFOL N/A 09/20/2017   Procedure: ESOPHAGOGASTRODUODENOSCOPY (EGD) WITH PROPOFOL;  Surgeon: Lucilla Lame, MD;  Location: [place];  Service: Endoscopy;  Laterality: N/A;  Diabetic - oral meds   FINGER SURGERY Left    INTRAVASCULAR PRESSURE WIRE/FFR STUDY N/A 09/05/2019   Procedure: INTRAVASCULAR PRESSURE WIRE/FFR STUDY;  Surgeon: Nelva Bush, MD;  Location: [place] CV LAB;  Service: Cardiovascular;  Laterality: N/A;   KNEE SURGERY Right    LEFT HEART CATH AND CORONARY ANGIOGRAPHY Left 09/05/2019   Procedure: LEFT HEART CATH AND CORONARY ANGIOGRAPHY (2.75 x 18 mm Resolute Onyx DES x 1 to prox/mid LAD);  Surgeon: Nelva Bush, MD;  Location: [place] CV LAB;  Service: Cardiovascular;  Laterality: Left;   NECK SURGERY     spleen surgery     TOE SURGERY Right    TOTAL HIP ARTHROPLASTY Right 06/03/2021   Procedure: TOTAL HIP ARTHROPLASTY ANTERIOR APPROACH;  Surgeon: Hessie Knows, MD;  Location: [facility] ORS;  Service: Orthopedics;  Laterality: Right;   Family History  Problem Relation Age of Onset   Arthritis Mother    Diabetes Mother    Kidney disease Mother    Heart disease Mother    Hypertension Mother    Arthritis Father    Hearing loss Father    Hypertension Father    Heart disease Father    Diabetes Sister    Heart disease Sister    Diabetes Daughter    Diabetes Maternal Aunt    Diabetes Maternal Grandmother    Heart Problems Brother    Heart Problems Brother    Heart Problems Brother    Heart attack Maternal Grandfather    Colon cancer Paternal Grandfather    Social History   Socioeconomic History   Marital status: Married    Spouse name: Not on file   Number of children: Not  on file   Years of education: 13.5   Highest  education level: Some college, no degree  Occupational History   Occupation: disability   Tobacco Use   Smoking status: Former    Packs/day: 2.00    Years: 50.00    Pack years: 100.00    Types: Cigarettes    Quit date: 2011    Years since quitting: 11.7   Smokeless tobacco: Never  Vaping Use   Vaping Use: Never used  Substance and Sexual Activity   Alcohol use: No    Alcohol/week: 0.0 standard drinks   Drug use: No   Sexual activity: Not on file  Other Topics Concern   Not on file  Social History Narrative   Not on file   Social Determinants of Health   Financial Resource Strain: Medium Risk   Difficulty of Paying Living Expenses: Somewhat hard  Food Insecurity: No Food Insecurity   Worried About Charity fundraiser in the Last Year: Never true   Ran Out of Food in the Last Year: Never true  Transportation Needs: No Transportation Needs   Lack of Transportation (Medical): No   Lack of Transportation (Non-Medical): No  Physical Activity: Insufficiently Active   Days of Exercise per Week: 7 days   Minutes of Exercise per Session: 20 min  Stress: No Stress Concern Present   Feeling of Stress : Not at all  Social Connections: Moderately Integrated   Frequency of Communication with Friends and Family: Three times a week   Frequency of Social Gatherings with Friends and Family: Once a week   Attends Religious Services: More than 4 times per year   Active Member of [employer] or Organizations: No   Attends Music therapist: Never   Marital Status: Married    Tobacco Counseling Counseling given: Not Answered   Clinical Intake:  Pre-visit preparation completed: Yes  Pain : No/denies pain     Nutritional Risks: None Diabetes: Yes CBG done?: Yes CBG resulted in Enter/ Edit results?: No Did pt. bring in CBG monitor from home?: No  How often do you need to have someone help you when you read instructions, pamphlets, or other written materials from your  doctor or pharmacy?: 1 - Never What is the last grade level you completed in school?: 12th grade  Diabetic?Yes  Interpreter Needed?: No      Activities of Daily Living In your present state of health, do you have any difficulty performing the following activities: 07/05/2021 05/27/2021  Hearing? [place]? Y -  Difficulty concentrating or making decisions? Y -  Walking or climbing stairs? Y Y  Dressing or bathing? N -  Doing errands, shopping? N Y  Conservation officer, nature and eating ? N -  Using the Toilet? N -  In the past six months, have you accidently leaked urine? N -  Do you have problems with loss of bowel control? N -  Managing your Medications? N -  Managing your Finances? N -  Housekeeping or managing your Housekeeping? N -  Some recent data might be hidden    Patient Care Team: [name], NP as PCP - General (Nurse Practitioner) End, Harrell Gave, MD as PCP - Cardiology (Cardiology) Vladimir Faster, [facility] (Pharmacist) Shamleffer, Melanie Crazier, MD as Consulting Physician (Endocrinology)  Indicate any recent Medical Services you may have received from other than [facility] providers in the past year (date may be approximate).     Assessment:   This is a  routine wellness examination for [place].  Hearing/Vision screen No results found.  Dietary issues and exercise activities discussed: Current Exercise Habits: Home exercise routine, Type of exercise: walking, Time (Minutes): 20, Frequency (Times/Week): 7, Weekly Exercise (Minutes/Week): 140, Intensity: Mild, Exercise limited by: cardiac condition(s)   Goals Addressed   None   Depression Screen PHQ 2/9 Scores 07/05/2021 04/02/2021 12/06/2020 09/02/2020 05/23/2020 05/21/2020 03/06/2020  PHQ - 2 Score 1 3 0 1 4 3 3   PHQ- 9 Score - 10 3 7 9 13 9   Exception Documentation - - - - - - -    Fall Risk Fall Risk  07/05/2021 12/06/2020 05/23/2020 05/17/2020 03/06/2020  Falls in the past year? 0 0 0 1 1  Comment - - - - -  Number falls  in past yr: 0 - 0 1 1  Comment - - - - -  Injury with Fall? 0 0 0 0 0  Comment - - - - -  Risk Factor Category  - - - - -  Risk for fall due to : No Fall Risks - - - History of fall(s)  Risk for fall due to: Comment - - - - -  Follow up Falls evaluation completed - Falls evaluation completed - -    FALL RISK PREVENTION PERTAINING TO THE HOME:  Any stairs in or around the home? No  If so, are there any without handrails? No  Home free of loose throw rugs in walkways, pet beds, electrical cords, etc? Yes  Adequate lighting in your home to reduce risk of falls? Yes   ASSISTIVE DEVICES UTILIZED TO PREVENT FALLS:  Life alert? No  Use of a cane, walker or w/c? Yes  Grab bars in the bathroom? Yes  Shower chair or bench in shower? Yes  Elevated toilet seat or a handicapped toilet? Yes   TIMED UP AND GO:  Was the test performed?  N/A .  Length of time to ambulate 10 feet: N/A sec.     Cognitive Function:     6CIT Screen 07/05/2021 03/06/2020 04/27/2019 04/25/2018  What Year? 0 points 0 points 0 points 0 points  What month? 0 points 0 points 0 points 0 points  What time? 0 points 0 points 0 points 0 points  Count back from 20 0 points 0 points 0 points 0 points  Months in reverse 0 points 0 points 0 points 0 points  Repeat phrase 0 points 0 points 0 points 0 points  Total Score 0 0 0 0    Immunizations Immunization History  Administered Date(s) Administered   Influenza,inj,Quad PF,6+ Mos 08/21/2015, 09/24/2016, 07/12/2017, 06/23/2018, 08/04/2018, 07/17/2020   Influenza-Unspecified 07/19/2014, 08/04/2018, 07/11/2019   Pneumococcal Polysaccharide-23 06/07/2012, 05/30/2020   Td 10/28/2007   Tdap 04/25/2018   Zoster Recombinat (Shingrix) 07/11/2019, 09/13/2019    TDAP status: Up to date  Flu Vaccine status: Due, Education has been provided regarding the importance of this vaccine. Advised may receive this vaccine at local pharmacy or Health Dept. Aware to provide a copy of the  vaccination record if obtained from local pharmacy or Health Dept. Verbalized acceptance and understanding.  Pneumococcal vaccine status: Up to date  Covid-19 vaccine status: Declined, Education has been provided regarding the importance of this vaccine but patient still declined. Advised may receive this vaccine at local pharmacy or Health Dept.or vaccine clinic. Aware to provide a copy of the vaccination record if obtained from local pharmacy or Health Dept. Verbalized acceptance and understanding.  Qualifies for Shingles Vaccine?  Yes   Zostavax completed Yes   Shingrix Completed?: Yes  Screening Tests Health Maintenance  Topic Date Due   COVID-19 Vaccine (1) Never done   INFLUENZA VACCINE  05/19/2021   FOOT EXAM  05/20/2021   HEMOGLOBIN A1C  11/05/2021   OPHTHALMOLOGY EXAM  03/04/2022   COLONOSCOPY (Pts 45-66yr Insurance coverage will need to be confirmed)  06/18/2025   TETANUS/TDAP  04/25/2028   HIV Screening  Completed   Zoster Vaccines- Shingrix  Completed   Hepatitis C Screening  Addressed   HPV VACCINES  Aged Out    Health Maintenance  Health Maintenance Due  Topic Date Due   COVID-19 Vaccine (1) Never done   INFLUENZA VACCINE  05/19/2021   FOOT EXAM  05/20/2021    Colorectal cancer screening: Type of screening: Colonoscopy. Completed 06/18/20. Repeat every 5 years  Lung Cancer Screening: (Low Dose CT Chest recommended if Age 64-80years, 30 pack-year currently smoking OR have quit w/in 15years.) does not qualify.   Lung Cancer Screening Referral: N/A  Additional Screening:  Hepatitis C Screening: does qualify; Completed 01/24/15  Vision Screening: Recommended annual ophthalmology exams for early detection of glaucoma and other disorders of the eye. Is the patient up to date with their annual eye exam?  Yes  Who is the provider or what is the name of the office in which the patient attends annual eye exams[facility] pt is not established with a  provider, would they like to be referred to a provider to establish care?  N/A .   Dental Screening: Recommended annual dental exams for proper oral hygiene  Community Resource Referral / Chronic Care Management: CRR required this visit?  No   CCM required this visit?  No      Plan:     I have personally reviewed and noted the following in the patient's chart:   Medical and social history Use of alcohol, tobacco or illicit drugs  Current medications and supplements including opioid prescriptions. Patient is currently taking opioid prescriptions. Information provided to patient regarding non-opioid alternatives. Patient advised to discuss non-opioid treatment plan with their provider. Functional ability and status Nutritional status Physical activity Advanced directives List of other physicians Hospitalizations, surgeries, and ER visits in previous 12 months Vitals Screenings to include cognitive, depression, and falls Referrals and appointments  In addition, I have reviewed and discussed with patient certain preventive protocols, quality metrics, and best practice recommendations. A written personalized care plan for preventive services as well as general preventive health recommendations were provided to patient.   Mr. BStork, Thank you for taking time to come for your Medicare Wellness Visit. I appreciate your ongoing commitment to your health goals. Please review the following plan we discussed and let me know if I can assist you in the future.   These are the goals we discussed:  Goals       DIET - INCREASE WATER INTAKE      Recommend drinking at least 6-8 glasses of water a day       [place](see longitudinal plan of care for additional care plan information)  Current Barriers:  Chronic Disease Management support, education, and care coordination needs related to Hypertension, Hyperlipidemia, Diabetes, Coronary Artery Disease, GERD,  COPD, BPH, and Chronic pain     Diabetes Lab Results  Component Value Date/Time   HGBA1C 10.3 (H) 05/29/2020 03:27 PM   HGBA1C 9.5 (H) 05/23/2020  08:33 AM   HGBA1C 9.0 (A) 01/25/2020 09:22 AM   HGBA1C 7.9 (A) 10/26/2019 10:00 AM   HGBA1C 9.3 (H) 08/03/2018 03:35 PM  Pharmacist Clinical Goal(s): Over the next 60 days, patient will work with PharmD and providers to achieve A1c goal <7% Current regimen:  Tresiba 28 units qpm  Novolog 18 units ac breakfast, 16 units ac lunch and dinner Ozempic 0.5 mg weekly Interventions: Called Endocrinology office to help patient obtain meds Will reapply for PAP Provided diet and exercise counseling Reviewed medication refill requests  Reviewed goal glucose readings for an A1c of <7%, we want to see fasting sugars <130 and 2 hour after meal sugars <180.   Patient self care activities - Over the next 60 days, patient will: Check blood sugar 3-4 times daily, document, and provide at future appointments Contact provider with any episodes of hypoglycemia Provide required documentation for PAP Bring glucometer to scheduled appointments Focus on diet and exercise COPD Pharmacist Clinical Goal(s) Over the next 60  days, patient will work with PharmD and providers to optimize therapy and minimize symptoms Current regimen:  Albuterol 2 puffs q6h prn Stiolto 2 puffs daily Interventions: Reviewed inhaler technique Will renew Stiolto PAP Patient self care activities - Over the next 60 days, patient will: Attend all scheduled appointments Provide required portion of PAP documentation  Medication management Pharmacist Clinical Goal(s): Over the next 60 days, patient will work with PharmD and providers to achieve optimal medication adherence Current pharmacy: [facility] Interventions Comprehensive medication review performed. Continue current medication management strategy Patient self care activities - Over the next 60 days, patient will: Focus on  medication adherence by fill dates Take medications as prescribed Report any questions or concerns to PharmD and/or provider(s)  Initial goal documentation       PharmD "I want to stay healthy" (pt-stated)      Current Barriers:  Diabetes: uncontrolled but significantly improved, complicated by chronic medical conditions including atherosclerosis (s/p DES to LAD on 11/17), most recent A1c 7.9%; follows w/ Dr. Kelton Pillar @ [facility] Patient calls today needing help navigating how to get Novolog refill from [facility]. He reports he has ~1.5 pens left (~8 day supply) Current antihyperglycemic regimen: Tresiba 24 units daily, Novolog 16 units TID; Ozempic 0.5 weekly Hx GI intolerance to metformin APPROVED for Tresiba, Novolog, and Ozempic through 09/17/20 Cardiovascular risk reduction- s/p DES on 09/03/2019; follows w/ Dr. Saunders Revel; Current hypertensive regimen: benazepril 10 mg daily, isosorbide 90 mg daily Current hyperlipidemia regimen: atorvastatin 80 mg daily Current antiplatelet regimen: ASA 81 mg daily, clopidogrel 75 mg daily; plan for DAPT x 12 months, then clopidogrel monotherapy indefinitely. COPD; Stiolto 2.5/2.5 mg daily. Albuterol HFA PRN.  APPROVED for Stiolto through patient assistance through 10/18/20 Chronic pain- follows w/ Dr. Primus Bravo Doxepin 25 mg BID, duloxetine 30 mg QAM, 30 mg Qafternoon, 60 mg QPM; pregabalin 50 mg TID, oxycodone IR 15 mg Q4H; cyclobenzaprine 10 mg TID, diclofenac gel PRN  Pharmacist Clinical Goal(s):  Over the next 90 days, patient will work with PharmD and primary care provider to address optimized medication management  Interventions: Comprehensive medication review performed, medication list updated in electronic medical record Inter-disciplinary care team collaboration (see longitudinal plan of care) Communicated to Dr. Kelton Pillar and CMA that they need to fax [place] refill fax into [employer] for [facility]. Requested they send a  prescription for Novolog into [facility] now. Provided patient with instructions to access an Immediate Supply coupon from [employer] [phone]) while awaiting on  his refill.   Patient Self Care Activities:  Patient will check blood glucose BID , document, and provide at future appointments Patient will take medications as prescribed Patient will contact provider with any episodes of hypoglycemia Patient will report any questions or concerns to provider   Please see past updates related to this goal by clicking on the "Past Updates" button in the selected goal          This is a list of the screening recommended for you and due dates:  Health Maintenance  Topic Date Due   Flu Shot  05/19/2021   Complete foot exam   05/20/2021   COVID-19 Vaccine (1) 07/21/2021*   Hemoglobin A1C  11/05/2021   Eye exam for diabetics  03/04/2022   Colon Cancer Screening  06/18/2025   Tetanus Vaccine  04/25/2028   HIV Screening  Completed   Zoster (Shingles) Vaccine  Completed   Hepatitis C Screening: USPSTF Recommendation to screen - Ages 44-79 yo.  Addressed   HPV Vaccine  Aged Out  *Topic was postponed. The date shown is not the original due date.        Georgina Peer, [place]   07/05/2021   Nurse Notes: Non Face to Face 60 Minutes

## 2021-07-07 DIAGNOSIS — M545 Low back pain, unspecified: Secondary | ICD-10-CM | POA: Diagnosis not present

## 2021-07-07 DIAGNOSIS — E1142 Type 2 diabetes mellitus with diabetic polyneuropathy: Secondary | ICD-10-CM | POA: Diagnosis not present

## 2021-07-07 DIAGNOSIS — M5136 Other intervertebral disc degeneration, lumbar region: Secondary | ICD-10-CM | POA: Diagnosis not present

## 2021-07-07 DIAGNOSIS — M179 Osteoarthritis of knee, unspecified: Secondary | ICD-10-CM | POA: Diagnosis not present

## 2021-07-07 DIAGNOSIS — G894 Chronic pain syndrome: Secondary | ICD-10-CM | POA: Diagnosis not present

## 2021-07-08 DIAGNOSIS — J45909 Unspecified asthma, uncomplicated: Secondary | ICD-10-CM | POA: Diagnosis not present

## 2021-07-08 DIAGNOSIS — Z8505 Personal history of malignant neoplasm of liver: Secondary | ICD-10-CM | POA: Diagnosis not present

## 2021-07-08 DIAGNOSIS — Z96651 Presence of right artificial knee joint: Secondary | ICD-10-CM | POA: Diagnosis not present

## 2021-07-08 DIAGNOSIS — I2510 Atherosclerotic heart disease of native coronary artery without angina pectoris: Secondary | ICD-10-CM | POA: Diagnosis not present

## 2021-07-08 DIAGNOSIS — G4733 Obstructive sleep apnea (adult) (pediatric): Secondary | ICD-10-CM | POA: Diagnosis not present

## 2021-07-08 DIAGNOSIS — Z471 Aftercare following joint replacement surgery: Secondary | ICD-10-CM | POA: Diagnosis not present

## 2021-07-08 DIAGNOSIS — I6529 Occlusion and stenosis of unspecified carotid artery: Secondary | ICD-10-CM | POA: Diagnosis not present

## 2021-07-08 DIAGNOSIS — G43909 Migraine, unspecified, not intractable, without status migrainosus: Secondary | ICD-10-CM | POA: Diagnosis not present

## 2021-07-08 DIAGNOSIS — E1169 Type 2 diabetes mellitus with other specified complication: Secondary | ICD-10-CM | POA: Diagnosis not present

## 2021-07-08 DIAGNOSIS — G8929 Other chronic pain: Secondary | ICD-10-CM | POA: Diagnosis not present

## 2021-07-08 DIAGNOSIS — M5136 Other intervertebral disc degeneration, lumbar region: Secondary | ICD-10-CM | POA: Diagnosis not present

## 2021-07-08 DIAGNOSIS — K219 Gastro-esophageal reflux disease without esophagitis: Secondary | ICD-10-CM | POA: Diagnosis not present

## 2021-07-08 DIAGNOSIS — G4700 Insomnia, unspecified: Secondary | ICD-10-CM | POA: Diagnosis not present

## 2021-07-08 DIAGNOSIS — J439 Emphysema, unspecified: Secondary | ICD-10-CM | POA: Diagnosis not present

## 2021-07-08 DIAGNOSIS — E785 Hyperlipidemia, unspecified: Secondary | ICD-10-CM | POA: Diagnosis not present

## 2021-07-08 DIAGNOSIS — I10 Essential (primary) hypertension: Secondary | ICD-10-CM | POA: Diagnosis not present

## 2021-07-08 DIAGNOSIS — M5033 Other cervical disc degeneration, cervicothoracic region: Secondary | ICD-10-CM | POA: Diagnosis not present

## 2021-07-08 DIAGNOSIS — E1142 Type 2 diabetes mellitus with diabetic polyneuropathy: Secondary | ICD-10-CM | POA: Diagnosis not present

## 2021-07-08 DIAGNOSIS — M961 Postlaminectomy syndrome, not elsewhere classified: Secondary | ICD-10-CM | POA: Diagnosis not present

## 2021-07-08 DIAGNOSIS — Z96641 Presence of right artificial hip joint: Secondary | ICD-10-CM | POA: Diagnosis not present

## 2021-07-18 ENCOUNTER — Other Ambulatory Visit: Payer: Self-pay

## 2021-07-18 ENCOUNTER — Ambulatory Visit (INDEPENDENT_AMBULATORY_CARE_PROVIDER_SITE_OTHER): Payer: Medicare Other | Admitting: Nurse Practitioner

## 2021-07-18 ENCOUNTER — Encounter: Payer: Self-pay | Admitting: Nurse Practitioner

## 2021-07-18 VITALS — BP 128/74 | HR 86 | Temp 97.9°F | Ht 67.8 in | Wt 234.6 lb

## 2021-07-18 DIAGNOSIS — M5442 Lumbago with sciatica, left side: Secondary | ICD-10-CM | POA: Diagnosis not present

## 2021-07-18 DIAGNOSIS — Z23 Encounter for immunization: Secondary | ICD-10-CM

## 2021-07-18 DIAGNOSIS — I152 Hypertension secondary to endocrine disorders: Secondary | ICD-10-CM | POA: Diagnosis not present

## 2021-07-18 DIAGNOSIS — M5441 Lumbago with sciatica, right side: Secondary | ICD-10-CM

## 2021-07-18 DIAGNOSIS — G8929 Other chronic pain: Secondary | ICD-10-CM

## 2021-07-18 DIAGNOSIS — E1159 Type 2 diabetes mellitus with other circulatory complications: Secondary | ICD-10-CM

## 2021-07-18 DIAGNOSIS — D692 Other nonthrombocytopenic purpura: Secondary | ICD-10-CM

## 2021-07-18 DIAGNOSIS — I25118 Atherosclerotic heart disease of native coronary artery with other forms of angina pectoris: Secondary | ICD-10-CM | POA: Diagnosis not present

## 2021-07-18 DIAGNOSIS — J432 Centrilobular emphysema: Secondary | ICD-10-CM | POA: Diagnosis not present

## 2021-07-18 DIAGNOSIS — G4733 Obstructive sleep apnea (adult) (pediatric): Secondary | ICD-10-CM

## 2021-07-18 DIAGNOSIS — E1169 Type 2 diabetes mellitus with other specified complication: Secondary | ICD-10-CM | POA: Diagnosis not present

## 2021-07-18 DIAGNOSIS — E785 Hyperlipidemia, unspecified: Secondary | ICD-10-CM | POA: Diagnosis not present

## 2021-07-18 DIAGNOSIS — E1165 Type 2 diabetes mellitus with hyperglycemia: Secondary | ICD-10-CM

## 2021-07-18 DIAGNOSIS — F5104 Psychophysiologic insomnia: Secondary | ICD-10-CM

## 2021-07-18 DIAGNOSIS — I700 Atherosclerosis of aorta: Secondary | ICD-10-CM

## 2021-07-18 DIAGNOSIS — F321 Major depressive disorder, single episode, moderate: Secondary | ICD-10-CM

## 2021-07-18 DIAGNOSIS — E1142 Type 2 diabetes mellitus with diabetic polyneuropathy: Secondary | ICD-10-CM

## 2021-07-18 DIAGNOSIS — Z96641 Presence of right artificial hip joint: Secondary | ICD-10-CM

## 2021-07-18 NOTE — Assessment & Plan Note (Signed)
Chronic, ongoing, followed by pain management (Dr. Primus Bravo).  Continue current medication regimen as prescribed by this provider and Duloxetine for mood and pain.

## 2021-07-18 NOTE — Assessment & Plan Note (Signed)
Chronic, ongoing.  Continue Trazodone and adjust dose as needed.

## 2021-07-18 NOTE — Assessment & Plan Note (Signed)
Chronic, ongoing will continue to collaborate with endo.  A1C 7.6% recent visit in July.  Urine ALB 15 January 2021, continue Benazepril for kidney protection.  Continue collaboration with endocrinology and current medication regimen.  Continue collaboration with CCM team.  Recommend continue to monitor BS QID with [name] and focus heavily on diet and modest weight loss.  Bring Libre to visits.  Return in 3 months.

## 2021-07-18 NOTE — Patient Instructions (Signed)
Diabetes Mellitus and Nutrition, Adult When you have diabetes, or diabetes mellitus, it is very important to have healthy eating habits because your blood sugar (glucose) levels are greatly affected by what you eat and drink. Eating healthy foods in the right amounts, at about the same times every day, can help you:  Control your blood glucose.  Lower your risk of heart disease.  Improve your blood pressure.  Reach or maintain a healthy weight. What can affect my meal plan? Every person with diabetes is different, and each person has different needs for a meal plan. Your health care provider may recommend that you work with a dietitian to make a meal plan that is best for you. Your meal plan may vary depending on factors such as:  The calories you need.  The medicines you take.  Your weight.  Your blood glucose, blood pressure, and cholesterol levels.  Your activity level.  Other health conditions you have, such as heart or kidney disease. How do carbohydrates affect me? Carbohydrates, also called carbs, affect your blood glucose level more than any other type of food. Eating carbs naturally raises the amount of glucose in your blood. Carb counting is a method for keeping track of how many carbs you eat. Counting carbs is important to keep your blood glucose at a healthy level, especially if you use insulin or take certain oral diabetes medicines. It is important to know how many carbs you can safely have in each meal. This is different for every person. Your dietitian can help you calculate how many carbs you should have at each meal and for each snack. How does alcohol affect me? Alcohol can cause a sudden decrease in blood glucose (hypoglycemia), especially if you use insulin or take certain oral diabetes medicines. Hypoglycemia can be a life-threatening condition. Symptoms of hypoglycemia, such as sleepiness, dizziness, and confusion, are similar to symptoms of having too much  alcohol.  Do not drink alcohol if: ? Your health care provider tells you not to drink. ? You are pregnant, may be pregnant, or are planning to become pregnant.  If you drink alcohol: ? Do not drink on an empty stomach. ? Limit how much you use to:  0-1 drink a day for women.  0-2 drinks a day for men. ? Be aware of how much alcohol is in your drink. In the [place] one drink equals one 12 oz bottle of beer (355 mL), one 5 oz glass of wine (148 mL), or one 1 oz glass of hard liquor (44 mL). ? Keep yourself hydrated with water, diet soda, or unsweetened iced tea.  Keep in mind that regular soda, juice, and other mixers may contain a lot of sugar and must be counted as carbs. What are tips for following this plan? Reading food labels  Start by checking the serving size on the "Nutrition Facts" label of packaged foods and drinks. The amount of calories, carbs, fats, and other nutrients listed on the label is based on one serving of the item. Many items contain more than one serving per package.  Check the total grams (g) of carbs in one serving. You can calculate the number of servings of carbs in one serving by dividing the total carbs by 15. For example, if a food has 30 g of total carbs per serving, it would be equal to 2 servings of carbs.  Check the number of grams (g) of saturated fats and trans fats in one serving. Choose foods that have   a low amount or none of these fats.  Check the number of milligrams (mg) of salt (sodium) in one serving. Most people should limit total sodium intake to less than 2,300 mg per day.  Always check the nutrition information of foods labeled as "low-fat" or "nonfat." These foods may be higher in added sugar or refined carbs and should be avoided.  Talk to your dietitian to identify your daily goals for nutrients listed on the label. Shopping  Avoid buying canned, pre-made, or processed foods. These foods tend to be high in fat, sodium, and added  sugar.  Shop around the outside edge of the grocery store. This is where you will most often find fresh fruits and vegetables, bulk grains, fresh meats, and fresh dairy. Cooking  Use low-heat cooking methods, such as baking, instead of high-heat cooking methods like deep frying.  Cook using healthy oils, such as olive, canola, or sunflower oil.  Avoid cooking with butter, cream, or high-fat meats. Meal planning  Eat meals and snacks regularly, preferably at the same times every day. Avoid going long periods of time without eating.  Eat foods that are high in fiber, such as fresh fruits, vegetables, beans, and whole grains. Talk with your dietitian about how many servings of carbs you can eat at each meal.  Eat 4-6 oz (112-168 g) of lean protein each day, such as lean meat, chicken, fish, eggs, or tofu. One ounce (oz) of lean protein is equal to: ? 1 oz (28 g) of meat, chicken, or fish. ? 1 egg. ?  cup (62 g) of tofu.  Eat some foods each day that contain healthy fats, such as avocado, nuts, seeds, and fish.   What foods should I eat? Fruits Berries. Apples. Oranges. Peaches. Apricots. Plums. Grapes. Mango. Papaya. Pomegranate. Kiwi. Cherries. Vegetables Lettuce. Spinach. Leafy greens, including kale, chard, collard greens, and mustard greens. Beets. Cauliflower. Cabbage. Broccoli. Carrots. Green beans. Tomatoes. Peppers. Onions. Cucumbers. Brussels sprouts. Grains Whole grains, such as whole-wheat or whole-grain bread, crackers, tortillas, cereal, and pasta. Unsweetened oatmeal. Quinoa. Brown or wild rice. Meats and other proteins Seafood. Poultry without skin. Lean cuts of poultry and beef. Tofu. Nuts. Seeds. Dairy Low-fat or fat-free dairy products such as milk, yogurt, and cheese. The items listed above may not be a complete list of foods and beverages you can eat. Contact a dietitian for more information. What foods should I avoid? Fruits Fruits canned with  syrup. Vegetables Canned vegetables. Frozen vegetables with butter or cream sauce. Grains Refined white flour and flour products such as bread, pasta, snack foods, and cereals. Avoid all processed foods. Meats and other proteins Fatty cuts of meat. Poultry with skin. Breaded or fried meats. Processed meat. Avoid saturated fats. Dairy Full-fat yogurt, cheese, or milk. Beverages Sweetened drinks, such as soda or iced tea. The items listed above may not be a complete list of foods and beverages you should avoid. Contact a dietitian for more information. Questions to ask a health care provider  Do I need to meet with a diabetes educator?  Do I need to meet with a dietitian?  What number can I call if I have questions?  When are the best times to check my blood glucose? Where to find more information:  American Diabetes Association: diabetes.org  Academy of Nutrition and Dietetics: www.eatright.org  National Institute of Diabetes and Digestive and Kidney Diseases: www.niddk.nih.gov  Association of Diabetes Care and Education Specialists: www.diabeteseducator.org Summary  It is important to have healthy eating   habits because your blood sugar (glucose) levels are greatly affected by what you eat and drink.  A healthy meal plan will help you control your blood glucose and maintain a healthy lifestyle.  Your health care provider may recommend that you work with a dietitian to make a meal plan that is best for you.  Keep in mind that carbohydrates (carbs) and alcohol have immediate effects on your blood glucose levels. It is important to count carbs and to use alcohol carefully. This information is not intended to replace advice given to you by your health care provider. Make sure you discuss any questions you have with your health care provider. Document Revised: 09/12/2019 Document Reviewed: 09/12/2019 Elsevier Patient Education  2021 Elsevier Inc.  

## 2021-07-18 NOTE — Assessment & Plan Note (Signed)
Chronic, ongoing.  Continue current inhaler regimen, Stiolto through assistance program received and Albuterol.  Continue collaboration with pulmonary.  Recommend focus on modest weight loss.  Return in 3 months.

## 2021-07-18 NOTE — Assessment & Plan Note (Signed)
Chronic, ongoing will continue to collaborate with endo.  A1C 7.6% last visit, will defer recheck to endo upcoming.  Continue Duloxetine and collaboration with pain management + collaboration with endocrinology and current medication regimen. + collaboration with CCM team.  Recommend continue to monitor BS QID with [name] and focus heavily on diet and modest weight loss.  Bring Libre to visits.  Return in 3 months.

## 2021-07-18 NOTE — Assessment & Plan Note (Signed)
Chronic, stable with BP at goal.  Continue current medication regimen and adjust as needed + continue collaboration with cardiology team.  Recommend he continue to monitor BP closely at home and document + bring levels to visit.  DASH diet at home.  CMP today.  Return in 3 months.

## 2021-07-18 NOTE — Assessment & Plan Note (Signed)
BMI 35.88 with HTN, CAD, T2DM.  Praised for continue weight loss.  Recommended eating smaller high protein, low fat meals more frequently and exercising 30 mins a day 5 times a week with a goal of 10-15lb weight loss in the next 3 months. Patient voiced their understanding and motivation to adhere to these recommendations.

## 2021-07-18 NOTE — Assessment & Plan Note (Signed)
Noted on CT scan in 2016, continue daily statin and ASA for prevention.  Recommend modest weight loss.

## 2021-07-18 NOTE — Assessment & Plan Note (Signed)
Chronic, continue 100% use of CPAP at home.

## 2021-07-18 NOTE — Assessment & Plan Note (Signed)
Performed in August 2022 and healing well.  Continue to collaborate with Dr. Rudene Christians.

## 2021-07-18 NOTE — Assessment & Plan Note (Signed)
Followed by cardiology.  Continue current medication regimen and collaboration with cardiology.

## 2021-07-18 NOTE — Assessment & Plan Note (Signed)
Chronic, ongoing.  Continue current medication regimen and adjust as needed. Lipid panel today. 

## 2021-07-18 NOTE — Progress Notes (Addendum)
BP 128/74   Pulse 86   Temp 97.9 F (36.6 C) (Oral)   Ht 5' 7.8" (1.722 m)   Wt 234 lb 9.6 oz (106.4 kg)   SpO2 95%   BMI 35.88 kg/m    Subjective:    Patient ID: Edward Flesher., male    DOB: [birth year], 65 y.o.   MRN: [id number]  HPI: Edward Bones. is a 65 y.o. male  Chief Complaint  Patient presents with   Hypertension   Hyperlipidemia   Depression   Diabetes   COPD   Pain    DIABETES Sees endocrinology and last saw 02/27/21 with A1C 7.6% in July, Dr. Kelton Pillar. To continue insulin Basaglar 30 units and Humalog dosing. Taking Trulicity 1.5 MG weekly and Farxiga 5 MG.  Reports since surgery has had some elevation in sugars but are coming back down.  Has been working on weight loss. Hypoglycemic episodes:no Polydipsia/polyuria: no Visual disturbance: no Chest pain: no Paresthesias: no Glucose Monitoring: yes             Accucheck frequency: QID             Fasting glucose: this morning 149             Post prandial:              Evening:              Before meals:  Taking Insulin?: yes             Long acting insulin: Basaglar 30 units             Short acting insulin: Humalog 20 units with breakfast and then 14 with lunch and supper Blood Pressure Monitoring: daily Retinal Examination: Up to Date -- yesterday at [facility] Exam: Up to Date Pneumovax: Up to Date Influenza: Up to Date Aspirin: yes    HYPERTENSION / HYPERLIPIDEMIA Followed by cardiology and last saw 03/26/21 -- decreased Benazepril to 10 MG.  Last echo February 2021 = EF 60-65% with normal left ventricular diastolic parameters.  Continues Atorvastatin 80 MG.  Takes Benazepril 10 MG, Doxepin 25 MG for HTN.   Satisfied with current treatment? yes Duration of hypertension: chronic BP monitoring frequency: daily BP range: 120/80 on average BP medication side effects: no Duration of hyperlipidemia: chronic Cholesterol medication side effects: no Cholesterol supplements:  none Medication compliance: good compliance Aspirin: yes Recent stressors: no Recurrent headaches: no Visual changes: no Palpitations: no Dyspnea: at baseline, no worsening Chest pain: none recent Lower extremity edema: no Dizzy/lightheaded: no  The ASCVD Risk score (Arnett DK, et al., 2019) failed to calculate for the following reasons:   The patient has a prior MI or stroke diagnosis  COPD Continues Stiolto and Albuterol -- he is out of these, gets them via assistance. Does use CPAP and uses 100% of the time. Smoked for 48 years, was 6-7 when started.  Smoked 3 to 3 1/3 PPD.  Quit in 2012.    Goes for annual lung screening, last went July 2021. Last saw pulmonary on 06/17/21. COPD status: stable Satisfied with current treatment?: yes Oxygen use: no Dyspnea frequency: at baseline Cough frequency: minimal Rescue inhaler frequency:  using three times a day Limitation of activity: no Productive cough: none Last Spirometry: with pulmonary Pneumovax: Up to Date Influenza: Up to Date    CHRONIC PAIN Pain medicine provider, Dr. Primus Bravo and last saw one week ago. Takes Oxycodone as needed.  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 Rudene Christians -- reports he released him on Wednesday.  Next surgery will be right knee replacement in 4 weeks. Duration: chronic Mechanism of injury: unknown Location: low back Onset: gradual Severity: 8/10 Quality: sharp and aching Frequency: intermittent Radiation: R leg below the knee and L leg below the knee Aggravating factors: lifting, movement, walking and bending Alleviating factors: nothing Status: worse Treatments attempted: Oxycodone Relief with NSAIDs?: No NSAIDs Taken Nighttime pain:  at times Paresthesias / decreased sensation:  yes Bowel / bladder incontinence:  no Fevers:  no Dysuria / urinary frequency:  no  DEPRESSION Continues on Duloxetine 60 MG daily and Trazodone.  Mood status: stable Satisfied with current treatment?:  yes Symptom severity: moderate  Duration of current treatment : chronic Side effects: no Medication compliance: good compliance Psychotherapy/counseling: none Depressed mood: yes Anxious mood: no Anhedonia: no Significant weight loss or gain: no Insomnia: yes hard to fall asleep Fatigue: no Feelings of worthlessness or guilt: yes Impaired concentration/indecisiveness: yes Suicidal ideations: as above Hopelessness: no Crying spells: no Depression screen [facility] 2/9 07/18/2021 07/05/2021 04/02/2021 12/06/2020 09/02/2020  Decreased Interest 0 0 0 0 0  Down, Depressed, Hopeless 0 1 3 0 1  PHQ - 2 Score 0 1 3 0 1  Altered sleeping 0 - 3 0 3  Tired, decreased energy 3 - 3 3 3   Change in appetite 0 - 0 0 0  Feeling bad or failure about yourself  0 - 0 0 0  Trouble concentrating 0 - 0 0 0  Moving slowly or fidgety/restless 0 - 1 0 0  Suicidal thoughts 0 - 0 0 0  PHQ-9 Score 3 - 10 3 7   Difficult doing work/chores Not difficult at all - Somewhat difficult - Not difficult at all  Some recent data might be hidden   Relevant past medical, surgical, family and social history reviewed and updated as indicated. Interim medical history since our last visit reviewed. Allergies and medications reviewed and updated.  Review of Systems  Constitutional: Negative.   Respiratory: Negative.    Cardiovascular: Negative.   Gastrointestinal: Negative.   Neurological: Negative.    Per HPI unless specifically indicated above     Objective:    BP 128/74   Pulse 86   Temp 97.9 F (36.6 C) (Oral)   Ht 5' 7.8" (1.722 m)   Wt 234 lb 9.6 oz (106.4 kg)   SpO2 95%   BMI 35.88 kg/m   Wt Readings from Last 3 Encounters:  07/18/21 234 lb 9.6 oz (106.4 kg)  06/17/21 238 lb 12.8 oz (108.3 kg)  06/03/21 239 lb (108.4 kg)    Physical Exam Vitals and nursing note reviewed.  Constitutional:      General: He is awake. He is not in acute distress.    Appearance: He is well-developed and well-groomed. He is  obese. He is not ill-appearing or toxic-appearing.  HENT:     Head: Normocephalic and atraumatic.     Right Ear: Hearing normal. No drainage.     Left Ear: Hearing normal. No drainage.  Eyes:     General: Lids are normal.        Right eye: No discharge.        Left eye: No discharge.     Conjunctiva/sclera: Conjunctivae normal.     Pupils: Pupils are equal, round, and reactive to light.  Neck:     Thyroid: No thyromegaly.     Vascular: No carotid  bruit.     Trachea: Trachea normal.  Cardiovascular:     Rate and Rhythm: Normal rate and regular rhythm.     Heart sounds: Normal heart sounds, S1 normal and S2 normal. No murmur heard.   No gallop.  Pulmonary:     Effort: Pulmonary effort is normal. No accessory muscle usage or respiratory distress.     Breath sounds: Normal breath sounds.  Abdominal:     General: Bowel sounds are normal.     Palpations: Abdomen is soft.  Musculoskeletal:        General: Normal range of motion.     Cervical back: Normal range of motion and neck supple.     Right lower leg: No edema.     Left lower leg: No edema.  Skin:    General: Skin is warm and dry.     Capillary Refill: Capillary refill takes less than 2 seconds.     Findings: No rash.     Comments: Scattered pale bruising to upper extremities and abrasions -- baseline.  Neurological:     Mental Status: He is alert and oriented to person, place, and time.     Cranial Nerves: Cranial nerves are intact.     Motor: Motor function is intact.     Coordination: Coordination is intact.     Gait: Gait is intact.     Deep Tendon Reflexes: Reflexes are normal and symmetric.  Psychiatric:        Attention and Perception: Attention normal.        Mood and Affect: Mood normal.        Behavior: Behavior normal. Behavior is cooperative.        Thought Content: Thought content normal.   Diabetic Foot Exam - Simple   Simple Foot Form Visual Inspection No deformities, no ulcerations, no other skin  breakdown bilaterally: Yes Sensation Testing See comments: Yes Pulse Check Posterior Tibialis and Dorsalis pulse intact bilaterally: Yes Comments Sensation right 5/10 and left 6/10.      Results for orders placed or performed during the hospital encounter of 06/03/21  Glucose, capillary  Result Value Ref Range   Glucose-Capillary 146 (H) 70 - 99 mg/dL  Glucose, capillary  Result Value Ref Range   Glucose-Capillary 139 (H) 70 - 99 mg/dL  CBC  Result Value Ref Range   WBC 14.5 (H) 4.0 - 10.5 K/uL   RBC 3.75 (L) 4.22 - 5.81 MIL/uL   Hemoglobin 11.0 (L) 13.0 - 17.0 g/dL   HCT 32.9 (L) 39.0 - 52.0 %   MCV 87.7 80.0 - 100.0 fL   MCH 29.3 26.0 - 34.0 pg   MCHC 33.4 30.0 - 36.0 g/dL   RDW 14.5 11.5 - 15.5 %   Platelets 181 150 - 400 K/uL   nRBC 0.0 0.0 - 0.2 %  Creatinine, serum  Result Value Ref Range   Creatinine, Ser 1.05 0.61 - 1.24 mg/dL   GFR, Estimated >60 >60 mL/min  CBC  Result Value Ref Range   WBC 10.2 4.0 - 10.5 K/uL   RBC 3.57 (L) 4.22 - 5.81 MIL/uL   Hemoglobin 10.2 (L) 13.0 - 17.0 g/dL   HCT 30.7 (L) 39.0 - 52.0 %   MCV 86.0 80.0 - 100.0 fL   MCH 28.6 26.0 - 34.0 pg   MCHC 33.2 30.0 - 36.0 g/dL   RDW 14.5 11.5 - 15.5 %   Platelets 174 150 - 400 K/uL   nRBC 0.0 0.0 - 0.2 %  Basic metabolic panel  Result Value Ref Range   Sodium 136 135 - 145 mmol/L   Potassium 4.3 3.5 - 5.1 mmol/L   Chloride 102 98 - 111 mmol/L   CO2 25 22 - 32 mmol/L   Glucose, Bld 164 (H) 70 - 99 mg/dL   BUN 17 8 - 23 mg/dL   Creatinine, Ser 1.17 0.61 - 1.24 mg/dL   Calcium 9.5 8.9 - 10.3 mg/dL   GFR, Estimated >60 >60 mL/min   Anion gap 9 5 - 15  Glucose, capillary  Result Value Ref Range   Glucose-Capillary 128 (H) 70 - 99 mg/dL  Glucose, capillary  Result Value Ref Range   Glucose-Capillary 171 (H) 70 - 99 mg/dL  Glucose, capillary  Result Value Ref Range   Glucose-Capillary 160 (H) 70 - 99 mg/dL  Glucose, capillary  Result Value Ref Range   Glucose-Capillary 144 (H) 70 -  99 mg/dL  Surgical pathology  Result Value Ref Range   SURGICAL PATHOLOGY      SURGICAL PATHOLOGY CASE: [id number] PATIENT: Edward Schneider Surgical Pathology Report     Specimen Submitted: A. Femoral head, right  Clinical History: Primary localized osteoarthritis of right hip M16.11    DIAGNOSIS: A. FEMORAL HEAD, RIGHT; ARTHROPLASTY: - MILD DEGENERATIVE JOINT DISEASE. - NEGATIVE FOR MALIGNANCY.  GROSS DESCRIPTION: A. Labeled: Right femoral head Received: Formalin Collection time: 12:50 PM on 06/03/2021 Placed into formalin time: 1:06 PM on 06/03/2021 Size of specimen:      Head -4.6 x 4.7 x 4.5 cm      Neck -3.6 x 2.6 x 2 cm      Additional tissue: None grossly appreciated. Articular surface: The articular surface is diffusely tan and smooth with a peripheral 2.4 x 1 cm area of roughening. Cut surface: The cut surface is tan-white and yellow and firm. Other findings: None grossly identified.  Block summary: 1 - representative sections of roughened cartilage to adjacent cartilage  Tissue decalcification: Cassette 1  06/04/2021   Final Diagnosis performed by Allena Napoleon, MD.   Electronically signed 06/05/2021 9:38:23AM The electronic signature indicates that the named Attending Pathologist has evaluated the specimen Technical component performed at [facility], [address] [place], [place] [address] Lab: [phone] Dir: Rush Farmer, MD, MMM  Professional component performed at [facility], [facility], [place], [place], [place] [address] Lab: [phone] Dir: Dellia Nims. Rubinas, MD       Assessment & Plan:   Problem List Items Addressed This Visit       Cardiovascular and Mediastinum   Hypertension associated with diabetes ([place])    Chronic, stable with BP at goal.  Continue current medication regimen and adjust as needed + continue collaboration with cardiology team.  Recommend he continue to monitor BP closely at home and document +  bring levels to visit.  DASH diet at home.  CMP today.  Return in 3 months.      Relevant Orders   Comprehensive metabolic panel   CBC with Differential/Platelet   Atherosclerosis of abdominal aorta (HCC)    Noted on CT scan in 2016, continue daily statin and ASA for prevention.  Recommend modest weight loss.      Coronary artery disease of native artery of native heart with stable angina pectoris ([place])    Followed by cardiology.  Continue current medication regimen and collaboration with cardiology.        Senile purpura ([place])    As evidenced by bruising bilateral upper extremities and ASA  use.  Recommend gentle skin care at home and monitor for skin breakdown, if presents immediately notify provider.        Respiratory   Emphysema lung (HCC)    Chronic, ongoing.  Continue current inhaler regimen, Stiolto through assistance program received and Albuterol.  Continue collaboration with pulmonary.  Recommend focus on modest weight loss.  Return in 3 months.      Sleep apnea    Chronic, continue 100% use of CPAP at home.        Endocrine   Hyperlipidemia associated with type 2 diabetes mellitus (HCC)    Chronic, ongoing.  Continue current medication regimen and adjust as needed.  Lipid panel today.       Relevant Orders   Comprehensive metabolic panel   Lipid Panel w/o Chol/HDL Ratio   DM type 2 with diabetic peripheral neuropathy (HCC)    Chronic, ongoing will continue to collaborate with endo.  A1C 7.6% last visit, will defer recheck to endo upcoming.  Continue Duloxetine and collaboration with pain management + collaboration with endocrinology and current medication regimen. + collaboration with CCM team.  Recommend continue to monitor BS QID with [name] and focus heavily on diet and modest weight loss.  Bring Libre to visits.  Return in 3 months.      Uncontrolled type 2 diabetes mellitus with hyperglycemia (HCC) - Primary    Chronic, ongoing will continue to collaborate  with endo.  A1C 7.6% recent visit in July.  Urine ALB 15 January 2021, continue Benazepril for kidney protection.  Continue collaboration with endocrinology and current medication regimen.  Continue collaboration with CCM team.  Recommend continue to monitor BS QID with [name] and focus heavily on diet and modest weight loss.  Bring Libre to visits.  Return in 3 months.      Relevant Orders   Comprehensive metabolic panel     Other   Chronic back pain    Chronic, ongoing, followed by pain management (Dr. Primus Bravo).  Continue current medication regimen as prescribed by this provider and Duloxetine for mood and pain.       Insomnia    Chronic, ongoing.  Continue Trazodone and adjust dose as needed.        Morbid obesity (HCC)    BMI 35.88 with HTN, CAD, T2DM.  Praised for continue weight loss.  Recommended eating smaller high protein, low fat meals more frequently and exercising 30 mins a day 5 times a week with a goal of 10-15lb weight loss in the next 3 months. Patient voiced their understanding and motivation to adhere to these recommendations.       Depression, major, single episode, moderate (HCC)    Chronic, ongoing secondary to chronic diseases and pain.  Continue Duloxetine, which benefits mood and pain, plus Trazodone for sleep.  Refuses psychiatry or therapy referral.  Will continue current regimen and adjust as needed.  If SI presents he is to immediately go to ER, which he agrees with.  Return to office in 3 months, sooner if worsening mood.      Status post total hip replacement, right    Performed in August 2022 and healing well.  Continue to collaborate with Dr. Rudene Christians.      Other Visit Diagnoses     Need for influenza vaccination       Flu vaccine today   Relevant Orders   Flu Vaccine QUAD 54mo+IM (Fluarix, Fluzone & Alfiuria Quad PF) (Completed)        Follow  up plan: Return in about 3 months (around 10/17/2021) for T2DM, HTN/HLD, MOOD, COPD, CHRONIC PAIN, GERD -- PSA  CHECK.

## 2021-07-18 NOTE — Assessment & Plan Note (Signed)
As evidenced by bruising bilateral upper extremities and ASA use.  Recommend gentle skin care at home and monitor for skin breakdown, if presents immediately notify provider.

## 2021-07-18 NOTE — Assessment & Plan Note (Signed)
Chronic, ongoing secondary to chronic diseases and pain.  Continue Duloxetine, which benefits mood and pain, plus Trazodone for sleep.  Refuses psychiatry or therapy referral.  Will continue current regimen and adjust as needed.  If SI presents he is to immediately go to ER, which he agrees with.  Return to office in 3 months, sooner if worsening mood.

## 2021-07-19 LAB — COMPREHENSIVE METABOLIC PANEL
ALT: 25 IU/L (ref 0–44)
AST: 24 IU/L (ref 0–40)
Albumin/Globulin Ratio: 1.5 (ref 1.2–2.2)
Albumin: 4.6 g/dL (ref 3.8–4.8)
Alkaline Phosphatase: 85 IU/L (ref 44–121)
BUN/Creatinine Ratio: 16 (ref 10–24)
BUN: 19 mg/dL (ref 8–27)
Bilirubin Total: 0.5 mg/dL (ref 0.0–1.2)
CO2: 20 mmol/L (ref 20–29)
Calcium: 9.7 mg/dL (ref 8.6–10.2)
Chloride: 99 mmol/L (ref 96–106)
Creatinine, Ser: 1.16 mg/dL (ref 0.76–1.27)
Globulin, Total: 3 g/dL (ref 1.5–4.5)
Glucose: 234 mg/dL — ABNORMAL HIGH (ref 70–99)
Potassium: 4.7 mmol/L (ref 3.5–5.2)
Sodium: 137 mmol/L (ref 134–144)
Total Protein: 7.6 g/dL (ref 6.0–8.5)
eGFR: 70 mL/min/{1.73_m2} (ref >59)

## 2021-07-19 LAB — CBC WITH DIFFERENTIAL/PLATELET
Basophils Absolute: 0.1 10*3/uL (ref 0.0–0.2)
Basos: 1 %
EOS (ABSOLUTE): 0.4 10*3/uL (ref 0.0–0.4)
Eos: 4 %
Hematocrit: 37 % — ABNORMAL LOW (ref 37.5–51.0)
Hemoglobin: 12.1 g/dL — ABNORMAL LOW (ref 13.0–17.7)
Immature Grans (Abs): 0 10*3/uL (ref 0.0–0.1)
Immature Granulocytes: 1 %
Lymphocytes Absolute: 2.7 10*3/uL (ref 0.7–3.1)
Lymphs: 31 %
MCH: 27.1 pg (ref 26.6–33.0)
MCHC: 32.7 g/dL (ref 31.5–35.7)
MCV: 83 fL (ref 79–97)
Monocytes Absolute: 0.8 10*3/uL (ref 0.1–0.9)
Monocytes: 9 %
Neutrophils Absolute: 4.9 10*3/uL (ref 1.4–7.0)
Neutrophils: 54 %
Platelets: 258 10*3/uL (ref 150–450)
RBC: 4.46 x10E6/uL (ref 4.14–5.80)
RDW: 13.4 % (ref 11.6–15.4)
WBC: 8.8 10*3/uL (ref 3.4–10.8)

## 2021-07-19 LAB — LIPID PANEL W/O CHOL/HDL RATIO
Cholesterol, Total: 118 mg/dL (ref 100–199)
HDL: 51 mg/dL (ref >39)
LDL Chol Calc (NIH): 46 mg/dL (ref 0–99)
Triglycerides: 115 mg/dL (ref 0–149)
VLDL Cholesterol Cal: 21 mg/dL (ref 5–40)

## 2021-07-19 NOTE — Progress Notes (Signed)
Good morning amazing crew, please let Mahari know his labs have returned.  Overall kidney and liver function are normal.  CBC is showing gradual improvement in his anemia post surgery and we will continue to monitor.  Cholesterol levels are at goal.  Continue all current medications and scheduled follow-up visits. Keep being amazing!!  Thank you for allowing me to participate in your care.  I appreciate you. Kindest regards, Jule Whitsel

## 2021-07-24 ENCOUNTER — Other Ambulatory Visit: Payer: Self-pay | Admitting: Internal Medicine

## 2021-07-28 ENCOUNTER — Other Ambulatory Visit: Payer: Self-pay | Admitting: *Deleted

## 2021-07-28 DIAGNOSIS — Z87891 Personal history of nicotine dependence: Secondary | ICD-10-CM

## 2021-07-29 ENCOUNTER — Other Ambulatory Visit: Payer: Self-pay | Admitting: Internal Medicine

## 2021-07-31 ENCOUNTER — Other Ambulatory Visit: Payer: Self-pay | Admitting: Internal Medicine

## 2021-08-01 ENCOUNTER — Other Ambulatory Visit: Payer: Self-pay | Admitting: Internal Medicine

## 2021-08-01 DIAGNOSIS — E1165 Type 2 diabetes mellitus with hyperglycemia: Secondary | ICD-10-CM | POA: Diagnosis not present

## 2021-08-01 DIAGNOSIS — E1142 Type 2 diabetes mellitus with diabetic polyneuropathy: Secondary | ICD-10-CM | POA: Diagnosis not present

## 2021-08-01 NOTE — Telephone Encounter (Signed)
*  STAT* If patient is at the pharmacy, call can be transferred to refill team.   1. Which medications need to be refilled? (please list name of each medication and dose if known) benazepril 10 MG 1 tablet daily   2. Which pharmacy/location (including street and city if local pharmacy) is medication to be sent to? [facility] in [place]   3. Do they need a 30 day or 90 day supply? 90 day   Patient is out

## 2021-08-04 ENCOUNTER — Other Ambulatory Visit: Payer: Self-pay

## 2021-08-04 DIAGNOSIS — G894 Chronic pain syndrome: Secondary | ICD-10-CM | POA: Diagnosis not present

## 2021-08-04 DIAGNOSIS — M5136 Other intervertebral disc degeneration, lumbar region: Secondary | ICD-10-CM | POA: Diagnosis not present

## 2021-08-04 DIAGNOSIS — Z79891 Long term (current) use of opiate analgesic: Secondary | ICD-10-CM | POA: Diagnosis not present

## 2021-08-04 DIAGNOSIS — M545 Low back pain, unspecified: Secondary | ICD-10-CM | POA: Diagnosis not present

## 2021-08-04 MED ORDER — BENAZEPRIL HCL 10 MG PO TABS: 10.0000 mg | ORAL_TABLET | Freq: Every day | ORAL | 0 refills | Status: DC

## 2021-08-04 NOTE — Telephone Encounter (Signed)
Rx sent to pharmacy as requested.

## 2021-08-06 ENCOUNTER — Other Ambulatory Visit: Payer: Self-pay | Admitting: Orthopedic Surgery

## 2021-08-07 ENCOUNTER — Other Ambulatory Visit: Payer: Self-pay

## 2021-08-07 ENCOUNTER — Encounter: Payer: Self-pay | Admitting: Podiatry

## 2021-08-07 ENCOUNTER — Encounter (INDEPENDENT_AMBULATORY_CARE_PROVIDER_SITE_OTHER): Payer: Self-pay

## 2021-08-07 ENCOUNTER — Ambulatory Visit: Payer: Medicare Other | Admitting: Podiatry

## 2021-08-07 DIAGNOSIS — E1142 Type 2 diabetes mellitus with diabetic polyneuropathy: Secondary | ICD-10-CM | POA: Diagnosis not present

## 2021-08-07 DIAGNOSIS — M79675 Pain in left toe(s): Secondary | ICD-10-CM | POA: Diagnosis not present

## 2021-08-07 DIAGNOSIS — D689 Coagulation defect, unspecified: Secondary | ICD-10-CM | POA: Diagnosis not present

## 2021-08-07 DIAGNOSIS — M79674 Pain in right toe(s): Secondary | ICD-10-CM | POA: Diagnosis not present

## 2021-08-07 DIAGNOSIS — B351 Tinea unguium: Secondary | ICD-10-CM | POA: Diagnosis not present

## 2021-08-07 NOTE — Progress Notes (Signed)
This patient returns to my office for at risk foot care.  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  This patient is unable to cut nails himself since the patient cannot reach his nails.These nails are painful walking and wearing shoes.  This patient presents for at risk foot care today.  General Appearance  Alert, conversant and in no acute stress.  Vascular  Dorsalis pedis and posterior tibial  pulses are palpable  bilaterally.  Capillary return is within normal limits  bilaterally. Temperature is within normal limits  bilaterally.  Neurologic  Senn-Weinstein monofilament wire test within normal limits  bilaterally. Muscle power within normal limits bilaterally.  Nails Thick disfigured discolored nails with subungual debris  from hallux to fifth toes bilaterally. No evidence of bacterial infection or drainage bilaterally.  Orthopedic  No limitations of motion  feet .  No crepitus or effusions noted.  No bony pathology or digital deformities noted.  Skin  normotropic skin with no porokeratosis noted bilaterally.  No signs of infections or ulcers noted.     Onychomycosis  Pain in right toes  Pain in left toes  Consent was obtained for treatment procedures.   Mechanical debridement of nails 1-5  bilaterally performed with a nail nipper.  Filed with dremel without incident.    Return office visit   10 weeks                   Told patient to return for periodic foot care and evaluation due to potential at risk complications.   Gardiner Barefoot DPM

## 2021-08-12 ENCOUNTER — Ambulatory Visit: Payer: Self-pay

## 2021-08-12 ENCOUNTER — Encounter: Payer: Self-pay | Admitting: Nurse Practitioner

## 2021-08-12 ENCOUNTER — Other Ambulatory Visit: Payer: Self-pay

## 2021-08-12 ENCOUNTER — Ambulatory Visit (INDEPENDENT_AMBULATORY_CARE_PROVIDER_SITE_OTHER): Payer: Medicare Other | Admitting: Nurse Practitioner

## 2021-08-12 ENCOUNTER — Other Ambulatory Visit: Payer: Self-pay | Admitting: Nurse Practitioner

## 2021-08-12 ENCOUNTER — Other Ambulatory Visit
Admission: RE | Admit: 2021-08-12 | Discharge: 2021-08-12 | Disposition: A | Discharge status: Home / Self Care | Payer: Medicare Other | Source: Ambulatory Visit | Attending: Orthopedic Surgery | Admitting: Orthopedic Surgery

## 2021-08-12 VITALS — BP 122/73 | HR 82 | Temp 97.9°F | Wt 239.2 lb

## 2021-08-12 DIAGNOSIS — L0100 Impetigo, unspecified: Secondary | ICD-10-CM | POA: Diagnosis not present

## 2021-08-12 MED ORDER — SULFAMETHOXAZOLE-TRIMETHOPRIM 800-160 MG PO TABS: 1.0000 | ORAL_TABLET | Freq: Two times a day (BID) | ORAL | 0 refills | Status: DC

## 2021-08-12 NOTE — Telephone Encounter (Signed)
Requested Prescriptions  Pending Prescriptions Disp Refills  . traZODone (DESYREL) 100 MG tablet [Pharmacy Med Name: traZODone HCl 100 MG Oral Tablet] 90 tablet 1    Sig: TAKE 1 TABLET BY MOUTH AT BEDTIME AS NEEDED FOR SLEEP     Psychiatry: Antidepressants - Serotonin Modulator Passed - 08/12/2021  2:38 PM      Passed - Completed PHQ-2 or PHQ-9 in the last 360 days      Passed - Valid encounter within last 6 months    Recent Outpatient Visits          Today [place], Lauren A, NP   3 weeks ago Uncontrolled type 2 diabetes mellitus with hyperglycemia ([place])   [place], [place] T, NP   4 months ago Generalized abdominal pain   [place], Rigby T, NP   5 months ago Uncontrolled type 2 diabetes mellitus with hyperglycemia ([place])   [place], Jolene T, NP   8 months ago Uncontrolled type 2 diabetes mellitus with hyperglycemia ([place])   [place], Barbaraann Faster, NP      Future Appointments            In 1 month [place], Harrell Gave, MD [facility]   In 2 months [place], Barbaraann Faster, NP [employer], [facility]

## 2021-08-12 NOTE — Patient Instructions (Addendum)
Your procedure is scheduled on: Friday 11/4 Report to Registration Desk in [place] then Day Surgery. To find out your arrival time please call [phone] between 1PM - 3PM on Thurs. 11/3.  Remember: Instructions that are not followed completely may result in serious medical risk,  up to and including death, or upon the discretion of your surgeon and anesthesiologist your  surgery may need to be rescheduled.     _X__ 1. Do not eat food after midnight the night before your procedure.                 No chewing gum or hard candies. You may drink clear liquids up to 2 hours                 before you are scheduled to arrive for your surgery- DO not drink clear                 liquids within 2 hours of the start of your surgery.                 Clear Liquids include:  water, G2 or                  Gatorade Zero (avoid Red/Purple/Blue), Black Coffee or Tea (Do not add                 anything to coffee or tea). ___x__2.   Complete you are diabetic drink, Gatorade Zero or G2.  __X__2.  On the morning of surgery brush your teeth with toothpaste and water, you                may rinse your mouth with mouthwash if you wish.  Do not swallow any  toothpaste of mouthwash.     _X__ 3.  No Alcohol for 24 hours before or after surgery.   _X__ 4.  Do Not Smoke or use e-cigarettes For 24 Hours Prior to Your Surgery.                 Do not use any chewable tobacco products for at least 6 hours prior to                 Surgery.  _X__  5.  Do not use any recreational drugs (marijuana, cocaine, heroin, ecstasy,  MDMA or other) For at least one week prior to your surgery. Combination of these drugs with anesthesia may have life threatening   results.  ____  6.  Bring all medications with you on the day of surgery if instructed.   ___x_  7.  Notify your doctor if there is any change in your medical condition      (cold, fever, infections).     Do not wear jewelry. Do not  wear lotions, powders, or perfumes. You may wear deodorant. Do not shave 48 hours prior to surgery. Men may shave face and neck. Do not bring valuables to the hospital.    [facility] is not responsible for any belongings or valuables.  Contacts, dentures or bridgework may not be worn into surgery. Leave your suitcase in the car. After surgery it may be brought to your room. For patients admitted to the hospital, discharge time is determined by your treatment team.   Patients discharged the day of surgery will not be allowed to drive home.   Make arrangements for someone to be with you for the first 24 hours of your Same Day  Discharge.    Please read over the following fact sheets that you were given:       __x__ Take these medicines the morning of surgery with A SIP OF WATER:    1.   2.   3.   4.  5.  6.  ____ Fleet Enema (as directed)   __x__ Use CHG Soap (or wipes) as directed  ____ Use Benzoyl Peroxide Gel as instructed  __x__ Use inhalers on the day of surgery  ____ Stop metformin 2 days prior to surgery    ____ Take 1/2 of usual insulin dose the night before surgery. No insulin the morning          of surgery.   __x__ Stop aspirin on   ___x_ Stop Anti-inflammatories on    __x__ Stop supplements until after surgery.    ____ Bring C-Pap to the hospital.    If you have any questions regarding your pre-procedure instructions,  Please call Pre-admit Testing at [phone]

## 2021-08-12 NOTE — Telephone Encounter (Signed)
Pt c/o right scalp pain and severe headache. Pt stated throbbing pain began yesterday. Pt stated his wife noted a bump to his right scalp the size of an eraser head. Pt stated the lump has gotten much bigger.  Pt woke up his am with blood under his fingernails and in his hair. Pt stated he noted pus from bump.  Pt has h/o migraine headaches. Pt took Acetaminophen without relief. Denies visual changes or fever. Care advice given and pt verbalized understanding. Appt made for today at 1300.       Reason for Disposition  [1] SEVERE headache (e.g., excruciating) AND [2] not improved after 2 hours of pain medicine  Answer Assessment - Initial Assessment Questions 1. LOCATION: "Where does it hurt?"      Right side of head hurts to touch 2. ONSET: "When did the headache start?" (Minutes, hours or days)      Yesterday- throbbing and sharp pain 3. PATTERN: "Does the pain come and go, or has it been constant since it started?"     constant 4. SEVERITY: "How bad is the pain?" and "What does it keep you from doing?"  (e.g., Scale 1-10; mild, moderate, or severe)   - MILD (1-3): doesn't interfere with normal activities    - MODERATE (4-7): interferes with normal activities or awakens from sleep    - SEVERE (8-10): excruciating pain, unable to do any normal activities        severe 5. RECURRENT SYMPTOM: "Have you ever had headaches before?" If Yes, ask: "When was the last time?" and "What happened that time?"      yes 6. CAUSE: "What do you think is causing the headache?"     Not rash and headache unsure what is going on 7. MIGRAINE: "Have you been diagnosed with migraine headaches?" If Yes, ask: "Is this headache similar?"      Yes-yes 8. HEAD INJURY: "Has there been any recent injury to the head?"     no 9. OTHER SYMPTOMS: "Do you have any other symptoms?" (fever, stiff neck, eye pain, sore throat, cold symptoms)     Neck hurn 10. PREGNANCY: "Is there any chance you are pregnant?" "When was  your last menstrual period?"       N/a  Protocols used: Headache-A-AH

## 2021-08-12 NOTE — Progress Notes (Signed)
Acute Office Visit  Subjective:    Patient ID: Edward Caddell., male    DOB: 05-20-1956, 65 y.o.   MRN: [id number]  Chief Complaint  Patient presents with   Headache    HPI Patient is in today for a bump to his scalp that started Sunday. He woke up this morning with his hair stuck to his pillow. Area is really sore and giving him a headache.   SKIN INFECTION  Duration: days Location: 2 days ago History of trauma in area: no Pain: yes Quality: throbbing, itching Severity: 8/10 Redness: yes Swelling: no Oozing: yes Pus: yes Fevers: no Nausea/vomiting: no Status: worse Treatments attempted:none  Tetanus: UTD   Past Medical History:  Diagnosis Date   Allergy    Aortic atherosclerosis (HCC)    Asthma    C. difficile diarrhea    Chronic pain    Collagenous colitis    Coronary artery disease    a.) PCI with 2.75 x 18 mm Resolute Onyx DES x 1 to prox/mid LAD on 09/05/2019   DDD (degenerative disc disease), cervical    DDD (degenerative disc disease), lumbar    GERD (gastroesophageal reflux disease)    Grade I diastolic dysfunction    Hepatic steatosis    Hyperlipidemia    Hypertension    Liver cancer ([place]) 03/2015   Migraines    Myocardial infarction (HCC)     OSA on CPAP    Seizures ([place])    several as child when sick.  None since age 41   Stroke [facility])    'mini-stroke" 30 yrs ago. no deficits.   T2DM (type 2 diabetes mellitus) ([place])    Wears dentures    full upper and lower    Past Surgical History:  Procedure Laterality Date   APPENDECTOMY     BACK SURGERY     CARDIAC CATHETERIZATION     No stent placed in his "29's"   CERVICAL FUSION     COLONOSCOPY WITH PROPOFOL N/A 03/06/2016   Procedure: COLONOSCOPY WITH PROPOFOL;  Surgeon: Lucilla Lame, MD;  Location: [place];  Service: Endoscopy;  Laterality: N/A;  requests early   COLONOSCOPY WITH PROPOFOL N/A 06/18/2020   Procedure: COLONOSCOPY WITH PROPOFOL;  Surgeon: Lucilla Lame, MD;   Location: [facility] ENDOSCOPY;  Service: Endoscopy;  Laterality: N/A;   ESOPHAGOGASTRODUODENOSCOPY (EGD) WITH PROPOFOL N/A 09/20/2017   Procedure: ESOPHAGOGASTRODUODENOSCOPY (EGD) WITH PROPOFOL;  Surgeon: Lucilla Lame, MD;  Location: [place];  Service: Endoscopy;  Laterality: N/A;  Diabetic - oral meds   FINGER SURGERY Left    INTRAVASCULAR PRESSURE WIRE/FFR STUDY N/A 09/05/2019   Procedure: INTRAVASCULAR PRESSURE WIRE/FFR STUDY;  Surgeon: Nelva Bush, MD;  Location: [place] [facility];  Service: Cardiovascular;  Laterality: N/A;   KNEE SURGERY Right    LEFT HEART CATH AND CORONARY ANGIOGRAPHY Left 09/05/2019   Procedure: LEFT HEART CATH AND CORONARY ANGIOGRAPHY (2.75 x 18 mm Resolute Onyx DES x 1 to prox/mid LAD);  Surgeon: Nelva Bush, MD;  Location: [place] [facility];  Service: Cardiovascular;  Laterality: Left;   NECK SURGERY     spleen surgery     TOE SURGERY Right    TOTAL HIP ARTHROPLASTY Right 06/03/2021   Procedure: TOTAL HIP ARTHROPLASTY ANTERIOR APPROACH;  Surgeon: Hessie Knows, MD;  Location: [facility] OR[facility];  Service: Orthopedics;  Laterality: Right;    Family History  Problem Relation Age of Onset   Arthritis Mother    Diabetes Mother    Kidney disease  Mother    Heart disease Mother    Hypertension Mother    Arthritis Father    Hearing loss Father    Hypertension Father    Heart disease Father    Diabetes Sister    Heart disease Sister    Diabetes Daughter    Diabetes Maternal Aunt    Diabetes Maternal Grandmother    Heart Problems Brother    Heart Problems Brother    Heart Problems Brother    Heart attack Maternal Grandfather    Colon cancer Paternal Grandfather     Social History   Socioeconomic History   Marital status: Married    Spouse name: Not on file   Number of children: Not on file   Years of education: 13.5   Highest education level: Some college, no degree  Occupational History   Occupation: disability   Tobacco Use   Smoking  status: Former    Packs/day: 2.00    Years: 50.00    Pack years: 100.00    Types: Cigarettes    Quit date: 2011    Years since quitting: 11.8   Smokeless tobacco: Never  Vaping Use   Vaping Use: Never used  Substance and Sexual Activity   Alcohol use: No    Alcohol/week: 0.0 standard drinks   Drug use: No   Sexual activity: Not on file  Other Topics Concern   Not on file  Social History Narrative   Not on file   Social Determinants of Health   Financial Resource Strain: Medium Risk   Difficulty of Paying Living Expenses: Somewhat hard  Food Insecurity: No Food Insecurity   Worried About Charity fundraiser in the Last Year: Never true   Ran Out of Food in the Last Year: Never true  Transportation Needs: No Transportation Needs   Lack of Transportation (Medical): No   Lack of Transportation (Non-Medical): No  Physical Activity: Insufficiently Active   Days of Exercise per Week: 7 days   Minutes of Exercise per Session: 20 min  Stress: No Stress Concern Present   Feeling of Stress : Not at all  Social Connections: Moderately Integrated   Frequency of Communication with Friends and Family: Three times a week   Frequency of Social Gatherings with Friends and Family: Once a week   Attends Religious Services: More than 4 times per year   Active Member of [employer] or Organizations: No   Attends Music therapist: Never   Marital Status: Married  Human resources officer Violence: Not At Risk   Fear of Current or Ex-Partner: No   Emotionally Abused: No   Physically Abused: No   Sexually Abused: No    Outpatient Medications Prior to Visit  Medication Sig Dispense Refill   acetaminophen (TYLENOL) 650 MG CR tablet Take 1,300 mg by mouth every 8 (eight) hours.     albuterol (VENTOLIN HFA) 108 (90 Base) MCG/ACT inhaler Inhale 2 puffs into the lungs every 6 (six) hours as needed for wheezing or shortness of breath. 16 g 3   Ascorbic Acid (VITAMIN C) 1000 MG tablet Take 1,000  mg by mouth daily.      aspirin EC 81 MG tablet Take 81 mg by mouth daily. Swallow whole.     atorvastatin (LIPITOR) 80 MG tablet TAKE 1 TABLET BY MOUTH AT BEDTIME 90 tablet 2   benazepril (LOTENSIN) 10 MG tablet Take 1 tablet (10 mg total) by mouth daily. 90 tablet 0   calcium carbonate (OS-CAL) 600 MG  TABS tablet Take 600 mg by mouth daily.      cetirizine (ZYRTEC) 10 MG tablet Take 10 mg by mouth daily.     Cholecalciferol (VITAMIN D3) 25 MCG (1000 UT) CHEW Chew 1,000 Units by mouth daily.      CINNAMON PO Take 1,000 mg by mouth 2 (two) times daily.      clobetasol cream (TEMOVATE) 1.21 % Apply 1 application topically 2 (two) times daily. 30 g 0   Continuous Blood Gluc Sensor (FREESTYLE LIBRE 2 SENSOR) MISC Use as instructed to check blood sugar daily 2 each 2   cyclobenzaprine (FLEXERIL) 10 MG tablet Take 1 tablet (10 mg total) by mouth 3 (three) times daily as needed for muscle spasms. (Patient taking differently: Take 10 mg by mouth 3 (three) times daily.) 60 tablet 1   dapagliflozin propanediol (FARXIGA) 5 MG TABS tablet Take 1 tablet (5 mg total) by mouth daily. 30 tablet 6   diclofenac Sodium (VOLTAREN) 1 % GEL Apply 2 g topically 4 (four) times daily as needed (pain).      diphenhydrAMINE (BENADRYL) 25 mg capsule Take 50 mg by mouth in the morning, at noon, and at bedtime.     docusate sodium (COLACE) 100 MG capsule Take 1 capsule (100 mg total) by mouth 2 (two) times daily. 20 capsule 0   doxepin (SINEQUAN) 25 MG capsule Take 50 mg by mouth at bedtime.      Dulaglutide (TRULICITY) 1.5 FX/5.8IT SOPN Inject 1.5 mg into the skin once a week.     DULoxetine (CYMBALTA) 30 MG capsule Take 30 mg by mouth 2 (two) times daily with breakfast and lunch.     DULoxetine (CYMBALTA) 60 MG capsule Take 1 capsule (60 mg total) by mouth at bedtime. 90 capsule 4   EPINEPHrine 0.3 mg/0.3 mL IJ SOAJ injection Inject 0.3 mg into the muscle as needed for anaphylaxis.     ezetimibe (ZETIA) 10 MG tablet Take 1  tablet by mouth once daily 30 tablet 6   Ginger, Zingiber officinalis, (GINGER PO) Take 1 tablet by mouth daily.     Ginseng 100 MG CAPS Take 100 mg by mouth daily.      Insulin Glargine (BASAGLAR KWIKPEN) 100 UNIT/ML Inject 32 Units into the skin at bedtime.     insulin lispro (HUMALOG KWIKPEN) 200 UNIT/ML KwikPen Inject 16-22 Units into the skin See admin instructions. Injecting 22 units with Breakfast, 16 units with lunch and supper     isosorbide mononitrate (IMDUR) 60 MG 24 hr tablet Take 1 tablet (60 mg total) by mouth daily. 90 tablet 4   methocarbamol (ROBAXIN) 500 MG tablet Take 1 tablet (500 mg total) by mouth every 6 (six) hours as needed for muscle spasms. 30 tablet 0   Multiple Vitamins-Minerals (CENTRUM SILVER 50+MEN) TABS Take 1 tablet by mouth daily.     mupirocin ointment (BACTROBAN) 2 % Apply 1 application topically daily.      NARCAN 4 MG/0.1ML LIQD nasal spray kit Place 0.4 mg into the nose as needed (opioid overdose).      nitroGLYCERIN (NITROSTAT) 0.4 MG SL tablet Place 0.4 mg under the tongue every 5 (five) minutes as needed for chest pain.      Omega-3 1000 MG CAPS Take 1,000 mg by mouth daily.      oxyCODONE (ROXICODONE) 15 MG immediate release tablet Take 15 mg by mouth See admin instructions. Six time a day     pantoprazole (PROTONIX) 40 MG tablet Take 1 tablet by  mouth twice daily 180 tablet 2   pregabalin (LYRICA) 50 MG capsule TAKE 1 CAPSULE BY MOUTH THREE TIMES DAILY 90 capsule 5   PRESCRIPTION MEDICATION CPAP     Rimegepant Sulfate (NURTEC) 75 MG TBDP Take one tablet (75 MG) by mouth every 24 hours as needed only for migraine. 30 tablet 2   senna-docusate (SENOKOT-S) 8.6-50 MG tablet Take 1 tablet by mouth at bedtime as needed for mild constipation. (Patient taking differently: Take 1 tablet by mouth at bedtime.) 10 tablet 0   sucralfate (CARAFATE) 1 g tablet TAKE 1 TABLET BY MOUTH 4 TIMES DAILY WITH MEALS AND AT BEDTIME (Patient taking differently: Take 1 g by mouth  at bedtime.) 360 tablet 0   Tiotropium Bromide-Olodaterol (STIOLTO RESPIMAT) 2.5-2.5 MCG/ACT AERS Inhale 2 puffs into the lungs daily as needed (shortness of breath). 1 each 5   traMADol (ULTRAM) 50 MG tablet Take 1 tablet (50 mg total) by mouth every 6 (six) hours as needed. (Patient taking differently: Take 50 mg by mouth every 6 (six) hours as needed for moderate pain.) 30 tablet 0   traZODone (DESYREL) 100 MG tablet Take 1 tablet (100 mg total) by mouth at bedtime as needed for sleep. (Patient taking differently: Take 100 mg by mouth at bedtime.) 90 tablet 4   vitamin A 10000 UNIT capsule Take 10,000 Units by mouth daily.     vitamin B-12 (CYANOCOBALAMIN) 1000 MCG tablet Take 1,000 mcg by mouth daily.     Vitamin E 400 units TABS Take 400 Units by mouth daily.      No facility-administered medications prior to visit.    Allergies  Allergen Reactions   Bee Venom Anaphylaxis   Crestor [Rosuvastatin Calcium] Shortness Of Breath and Swelling   Fentanyl Itching and Hives    blisters Patch   Gabapentin Diarrhea    Severe diarrhea which caused incontinence, loss of appetite and weight loss.   Shellfish Allergy Anaphylaxis and Swelling    Shrimp causes throat to swell and tingling in tongue.    Furosemide Nausea And Vomiting   Buprenorphine Hcl Itching   Simvastatin Diarrhea    Review of Systems  Constitutional: Negative.   HENT: Negative.    Respiratory: Negative.    Cardiovascular: Negative.   Skin:  Positive for wound (right side of head on scalp).  Neurological:  Positive for headaches.      Objective:    Physical Exam Vitals and nursing note reviewed.  Constitutional:      Appearance: Normal appearance.  HENT:     Head: Normocephalic.  Eyes:     Conjunctiva/sclera: Conjunctivae normal.  Cardiovascular:     Rate and Rhythm: Normal rate and regular rhythm.     Pulses: Normal pulses.     Heart sounds: Normal heart sounds.  Pulmonary:     Effort: Pulmonary effort is  normal.     Breath sounds: Normal breath sounds.  Musculoskeletal:     Cervical back: Normal range of motion.  Skin:    General: Skin is warm.     Findings: Rash (quarter sized lesion with honey colored crusting) present.  Neurological:     General: No focal deficit present.     Mental Status: He is alert and oriented to person, place, and time.  Psychiatric:        Mood and Affect: Mood normal.        Behavior: Behavior normal.        Thought Content: Thought content normal.  Judgment: Judgment normal.    BP 122/73   Pulse 82   Temp 97.9 F (36.6 C) (Oral)   Wt 239 lb 3.2 oz (108.5 kg)   SpO2 96%   BMI 36.59 kg/m  Wt Readings from Last 3 Encounters:  08/12/21 239 lb 3.2 oz (108.5 kg)  07/18/21 234 lb 9.6 oz (106.4 kg)  06/17/21 238 lb 12.8 oz (108.3 kg)    Health Maintenance Due  Topic Date Due   COVID-19 Vaccine (1) Never done   Pneumococcal Vaccine 53-37 Years old (3 - PCV) 05/30/2021    There are no preventive care reminders to display for this patient.   Lab Results  Component Value Date   TSH 1.340 04/02/2021   Lab Results  Component Value Date   WBC 8.8 07/18/2021   HGB 12.1 (L) 07/18/2021   HCT 37.0 (L) 07/18/2021   MCV 83 07/18/2021   PLT 258 07/18/2021   Lab Results  Component Value Date   NA 137 07/18/2021   K 4.7 07/18/2021   CO2 20 07/18/2021   GLUCOSE 234 (H) 07/18/2021   BUN 19 07/18/2021   CREATININE 1.16 07/18/2021   BILITOT 0.5 07/18/2021   ALKPHOS 85 07/18/2021   AST 24 07/18/2021   ALT 25 07/18/2021   PROT 7.6 07/18/2021   ALBUMIN 4.6 07/18/2021   CALCIUM 9.7 07/18/2021   ANIONGAP 9 06/04/2021   EGFR 70 07/18/2021   GFR 66.47 01/24/2019   Lab Results  Component Value Date   CHOL 118 07/18/2021   Lab Results  Component Value Date   HDL 51 07/18/2021   Lab Results  Component Value Date   LDLCALC 46 07/18/2021   Lab Results  Component Value Date   TRIG 115 07/18/2021   Lab Results  Component Value Date    CHOLHDL 4.0 12/05/2019   Lab Results  Component Value Date   HGBA1C 7.6 (H) 05/05/2021       Assessment & Plan:   Problem List Items Addressed This Visit   None Visit Diagnoses     Impetigo    -  Primary   Will treat with bactrim BID x5 days. He can also use mipirocin that he has at home on the area. Warm compresses to help with pain. F/U if not improving   Relevant Medications   sulfamethoxazole-trimethoprim (BACTRIM DS) 800-160 MG tablet        Meds ordered this encounter  Medications   sulfamethoxazole-trimethoprim (BACTRIM DS) 800-160 MG tablet    Sig: Take 1 tablet by mouth 2 (two) times daily.    Dispense:  10 tablet    Refill:  0      Charyl Dancer, NP

## 2021-08-13 ENCOUNTER — Encounter: Payer: Self-pay | Admitting: Urgent Care

## 2021-08-13 ENCOUNTER — Encounter
Admission: RE | Admit: 2021-08-13 | Discharge: 2021-08-13 | Disposition: A | Discharge status: Home / Self Care | Payer: Medicare Other | Source: Ambulatory Visit | Attending: Orthopedic Surgery | Admitting: Orthopedic Surgery

## 2021-08-13 ENCOUNTER — Telehealth: Payer: Self-pay | Admitting: *Deleted

## 2021-08-13 VITALS — BP 101/58 | HR 83 | Resp 18 | Ht 67.0 in | Wt 238.1 lb

## 2021-08-13 DIAGNOSIS — Z01812 Encounter for preprocedural laboratory examination: Secondary | ICD-10-CM | POA: Diagnosis not present

## 2021-08-13 DIAGNOSIS — Z79899 Other long term (current) drug therapy: Secondary | ICD-10-CM | POA: Insufficient documentation

## 2021-08-13 LAB — URINALYSIS, ROUTINE W REFLEX MICROSCOPIC
Bilirubin Urine: NEGATIVE
Glucose, UA: NEGATIVE mg/dL
Hgb urine dipstick: NEGATIVE
Ketones, ur: NEGATIVE mg/dL
Leukocytes,Ua: NEGATIVE
Nitrite: NEGATIVE
Protein, ur: NEGATIVE mg/dL
Specific Gravity, Urine: 1.024 (ref 1.005–1.030)
pH: 5 (ref 5.0–8.0)

## 2021-08-13 LAB — CBC WITH DIFFERENTIAL/PLATELET
Abs Immature Granulocytes: 0.03 10*3/uL (ref 0.00–0.07)
Basophils Absolute: 0.1 10*3/uL (ref 0.0–0.1)
Basophils Relative: 1 %
Eosinophils Absolute: 0.5 10*3/uL (ref 0.0–0.5)
Eosinophils Relative: 5 %
HCT: 38 % — ABNORMAL LOW (ref 39.0–52.0)
Hemoglobin: 12.4 g/dL — ABNORMAL LOW (ref 13.0–17.0)
Immature Granulocytes: 0 %
Lymphocytes Relative: 37 %
Lymphs Abs: 3.6 10*3/uL (ref 0.7–4.0)
MCH: 26.9 pg (ref 26.0–34.0)
MCHC: 32.6 g/dL (ref 30.0–36.0)
MCV: 82.4 fL (ref 80.0–100.0)
Monocytes Absolute: 0.9 10*3/uL (ref 0.1–1.0)
Monocytes Relative: 9 %
Neutro Abs: 4.7 10*3/uL (ref 1.7–7.7)
Neutrophils Relative %: 48 %
Platelets: 239 10*3/uL (ref 150–400)
RBC: 4.61 MIL/uL (ref 4.22–5.81)
RDW: 14.6 % (ref 11.5–15.5)
WBC: 9.8 10*3/uL (ref 4.0–10.5)
nRBC: 0 % (ref 0.0–0.2)

## 2021-08-13 LAB — COMPREHENSIVE METABOLIC PANEL
ALT: 28 U/L (ref 0–44)
AST: 34 U/L (ref 15–41)
Albumin: 4.1 g/dL (ref 3.5–5.0)
Alkaline Phosphatase: 67 U/L (ref 38–126)
Anion gap: 7 (ref 5–15)
BUN: 23 mg/dL (ref 8–23)
CO2: 25 mmol/L (ref 22–32)
Calcium: 10.2 mg/dL (ref 8.9–10.3)
Chloride: 103 mmol/L (ref 98–111)
Creatinine, Ser: 1.15 mg/dL (ref 0.61–1.24)
GFR, Estimated: >60 mL/min (ref >60)
Glucose, Bld: 110 mg/dL — ABNORMAL HIGH (ref 70–99)
Potassium: 4.5 mmol/L (ref 3.5–5.1)
Sodium: 135 mmol/L (ref 135–145)
Total Bilirubin: 0.8 mg/dL (ref 0.3–1.2)
Total Protein: 8 g/dL (ref 6.5–8.1)

## 2021-08-13 LAB — SURGICAL PCR SCREEN
MRSA, PCR: NEGATIVE
Staphylococcus aureus: NEGATIVE

## 2021-08-13 LAB — TYPE AND SCREEN
ABO/RH(D): A POS
Antibody Screen: NEGATIVE

## 2021-08-13 NOTE — Progress Notes (Signed)
Perioperative Services  Pre-Admission/Anesthesia Testing Clinical Review  Date: 08/14/21  Patient Demographics:  Name: Edward Schneider. DOB:   01-12-1956 MRN:   [id number]  Planned Surgical Procedure(s):    Case: [id number] Date/Time: 08/22/21 1230   Procedure: TOTAL KNEE ARTHROPLASTY (Right: Knee)   Anesthesia type: Choice   Pre-op diagnosis: Primary osteoarthritis of right knee  M17.11   Location: [facility] OR ROOM 08 / [place] [facility]S FOR ANESTHESIA GROUP   Surgeons: Hessie Knows, MD   NOTE: Available PAT nursing documentation and vital signs have been reviewed. Clinical nursing staff has updated patient's PMH/PSHx, current medication list, and drug allergies/intolerances to ensure comprehensive history available to assist in medical decision making as it pertains to the aforementioned surgical procedure and anticipated anesthetic course. Extensive review of available clinical information performed. [place] PMH and PSHx updated with any diagnoses/procedures that  may have been inadvertently omitted during his intake with the pre-admission testing department's nursing staff.  Clinical Discussion:  Edward Stawicki. is a 65 y.o. male who is submitted for pre-surgical anesthesia review and clearance prior to him undergoing the above procedure. Patient is a Former Smoker (100 pack years; quit 10/2009). Pertinent PMH includes: CAD, MI, diastolic dysfunction (N4OE), aortic atherosclerosis, CVA, HTN, HLD, T2DM, asthma, OSAH (requires nocturnal PAP therapy), GERD (no daily Tx), childhood seizures, liver cancer, DDD.  Patient is followed by cardiology (End, MD). He was last seen in the cardiology clinic on 04/15/2021; notes reviewed. At the time of his clinic visit, the patient denied any chest pain, shortness of breath, PND, orthopnea, palpitations, significant peripheral edema, vertiginous symptoms, or presyncope/syncope.  Patient with a PMH significant for cardiovascular diagnoses.   Patient suffered a  MI in the past.  He was treated with PTCA.  Details regarding cardiac [place].   TTE performed on 01/14/2019 revealed a low normal left ventricular systolic function with EF 50-55%. There were no significant valvular abnormalities.   Diagnostic left heart catheterization performed on 09/05/2019 revealing two-vessel CAD with a 60% proximal LAD stenosis (iFR 0.87) and moderate disease involving the rPL branches.  Left ventricular systolic function and filling pressure normal.  PCI was performed and a 2.75 x 18 mm Resolute Onyx DES x 1 was placed to the proximal/mid LAD resulting in a 0% residual stenosis and restoration of TIMI-3 flow.   Repeat TTE performed on 12/14/2019 revealed a normal left ventricular systolic function with an EF of 60 to 65%.  Doppler parameters consistent with impaired relaxation (G1DD).  There were no RWMAs or significant valvular abnormalities.   Patient with intentional 20 pound weight loss resulting in an overall decrease of his blood pressure. Blood pressure borderline low at 94/64 on currently prescribed ACEi and nitrate therapies.  He is on ezetimibe and a statin for her HLD diagnosis. T2DM reasonably controlled on currently prescribed regimen; last Hgb A1c 7.6% when last checked on 05/05/2021. Patient has an OSAH diagnosis; compliant with prescribed nocturnal PAP therapy. Functional capacity, as defined by DASI, is documented as being >/= 4 METS.  No changes were made to patient's medication regimen.  Patient to follow-up with outpatient cardiology in 6 months or sooner if needed.  Edward Flesher. is scheduled for an elective RIGHT TOTAL KNEE ARTHROPLASTY on 08/22/2021 with Dr. Hessie Knows, MD.  Given patient's past medical history significant for cardiovascular diagnoses, presurgical cardiac clearance was sought by the PAT team. Per cardiology, "based ACC/AHA guidelines, the patient's  past medical history, and the amount  of time since his last clinic visit, this patient would be at an overall ACCEPTABLE risk for the planned procedure without further cardiovascular testing or intervention at this time".  This patient is on daily antiplatelet therapy. He has been instructed on recommendations for continuing his daily low dose ASA therapy throughout the perioperative period.   Patient denies previous perioperative complications with anesthesia in the past. In review of the available records, it is noted that patient underwent a neuraxial anesthetic course here (ASA III) in 05/2021 without documented complications.   Vitals with BMI 08/13/2021 08/12/2021 07/18/2021  Height 5' 7"  - 5' 7.8"  Weight 238 lbs 2 oz 239 lbs 3 oz 234 lbs 10 oz  BMI 37.28 62.70 35.00  Systolic 938 182 993  Diastolic 58 73 74  Pulse 83 82 86    Providers/Specialists:   NOTE: Primary physician provider listed below. Patient may have been seen by APP or partner within same practice.   PROVIDER ROLE / SPECIALTY LAST Fabio Bering, MD ORTHOPEDICS (SURGEON) 07/16/2021  [name], NP PRIMARY CARE PROVIDER 08/12/2021  End, Harrell Gave, MD CARDIOLOGY 03/26/2021   Allergies:  Bee venom, Crestor [rosuvastatin calcium], Fentanyl, Gabapentin, Shellfish allergy, Furosemide, Buprenorphine hcl, Chlorhexidine gluconate, and Simvastatin  Current Home Medications:   No current facility-administered medications for this encounter.    acetaminophen (TYLENOL) 650 MG CR tablet   albuterol (VENTOLIN HFA) 108 (90 Base) MCG/ACT inhaler   Ascorbic Acid (VITAMIN C) 1000 MG tablet   aspirin EC 81 MG tablet   atorvastatin (LIPITOR) 80 MG tablet   benazepril (LOTENSIN) 10 MG tablet   calcium carbonate (OS-CAL) 600 MG TABS tablet   cetirizine (ZYRTEC) 10 MG tablet   Cholecalciferol (VITAMIN D3) 25 MCG (1000 UT) CHEW   CINNAMON PO   clobetasol cream (TEMOVATE) 0.05 %   cyclobenzaprine (FLEXERIL) 10 MG tablet   dapagliflozin propanediol  (FARXIGA) 5 MG TABS tablet   diclofenac Sodium (VOLTAREN) 1 % GEL   diphenhydrAMINE (BENADRYL) 25 mg capsule   docusate sodium (COLACE) 100 MG capsule   doxepin (SINEQUAN) 25 MG capsule   Dulaglutide (TRULICITY) 1.5 ZJ/6.9CV SOPN   DULoxetine (CYMBALTA) 30 MG capsule   DULoxetine (CYMBALTA) 60 MG capsule   EPINEPHrine 0.3 mg/0.3 mL IJ SOAJ injection   ezetimibe (ZETIA) 10 MG tablet   Ginger, Zingiber officinalis, (GINGER PO)   Ginseng 100 MG CAPS   Insulin Glargine (BASAGLAR KWIKPEN) 100 UNIT/ML   insulin lispro (HUMALOG KWIKPEN) 200 UNIT/ML KwikPen   isosorbide mononitrate (IMDUR) 60 MG 24 hr tablet   methocarbamol (ROBAXIN) 500 MG tablet   Multiple Vitamins-Minerals (CENTRUM SILVER 50+MEN) TABS   mupirocin ointment (BACTROBAN) 2 %   NARCAN 4 MG/0.1ML LIQD nasal spray kit   nitroGLYCERIN (NITROSTAT) 0.4 MG SL tablet   Omega-3 1000 MG CAPS   oxyCODONE (ROXICODONE) 15 MG immediate release tablet   pantoprazole (PROTONIX) 40 MG tablet   pregabalin (LYRICA) 50 MG capsule   PRESCRIPTION MEDICATION   senna-docusate (SENOKOT-S) 8.6-50 MG tablet   sucralfate (CARAFATE) 1 g tablet   Tiotropium Bromide-Olodaterol (STIOLTO RESPIMAT) 2.5-2.5 MCG/ACT AERS   traMADol (ULTRAM) 50 MG tablet   vitamin A 10000 UNIT capsule   vitamin B-12 (CYANOCOBALAMIN) 1000 MCG tablet   Vitamin E 400 units TABS   Continuous Blood Gluc Sensor (FREESTYLE LIBRE 2 SENSOR) MISC   Rimegepant Sulfate (NURTEC) 75 MG TBDP   sulfamethoxazole-trimethoprim (BACTRIM DS) 800-160 MG tablet   traZODone (DESYREL) 100  MG tablet   History:   Past Medical History:  Diagnosis Date   Allergy    Aortic atherosclerosis (HCC)    Asthma    C. difficile diarrhea    Chronic pain    Collagenous colitis    Coronary artery disease    a.) PCI with 2.75 x 18 mm Resolute Onyx DES x 1 to prox/mid LAD on 09/05/2019   DDD (degenerative disc disease), cervical    DDD (degenerative disc disease), lumbar    GERD (gastroesophageal reflux  disease)    Grade I diastolic dysfunction    Hepatic steatosis    Hyperlipidemia    Hypertension    Liver cancer ([place]) 03/2015   Migraines    Myocardial infarction (HCC)     OSA on CPAP    Seizures ([place])    several as child when sick.  None since age 63   Stroke [facility])    'mini-stroke" 30 yrs ago. no deficits.   T2DM (type 2 diabetes mellitus) ([place])    Wears dentures    full upper and lower   Past Surgical History:  Procedure Laterality Date   APPENDECTOMY     BACK SURGERY     CARDIAC CATHETERIZATION     No stent placed in his "47's"   CERVICAL FUSION     COLONOSCOPY WITH PROPOFOL N/A 03/06/2016   Procedure: COLONOSCOPY WITH PROPOFOL;  Surgeon: Lucilla Lame, MD;  Location: [facility];  Service: Endoscopy;  Laterality: N/A;  requests early   COLONOSCOPY WITH PROPOFOL N/A 06/18/2020   Procedure: COLONOSCOPY WITH PROPOFOL;  Surgeon: Lucilla Lame, MD;  Location: [facility] ENDOSCOPY;  Service: Endoscopy;  Laterality: N/A;   ESOPHAGOGASTRODUODENOSCOPY (EGD) WITH PROPOFOL N/A 09/20/2017   Procedure: ESOPHAGOGASTRODUODENOSCOPY (EGD) WITH PROPOFOL;  Surgeon: Lucilla Lame, MD;  Location: [facility];  Service: Endoscopy;  Laterality: N/A;  Diabetic - oral meds   FINGER SURGERY Left    INTRAVASCULAR PRESSURE WIRE/FFR STUDY N/A 09/05/2019   Procedure: INTRAVASCULAR PRESSURE WIRE/FFR STUDY;  Surgeon: Nelva Bush, MD;  Location: [place] CV LAB;  Service: Cardiovascular;  Laterality: N/A;   KNEE SURGERY Right    LEFT HEART CATH AND CORONARY ANGIOGRAPHY Left 09/05/2019   Procedure: LEFT HEART CATH AND CORONARY ANGIOGRAPHY (2.75 x 18 mm Resolute Onyx DES x 1 to prox/mid LAD);  Surgeon: Nelva Bush, MD;  Location: [place] CV LAB;  Service: Cardiovascular;  Laterality: Left;   NECK SURGERY     spleen surgery     TOE SURGERY Right    TOTAL HIP ARTHROPLASTY Right 06/03/2021   Procedure: TOTAL HIP ARTHROPLASTY ANTERIOR APPROACH;  Surgeon: Hessie Knows, MD;  Location:  [facility] ORS;  Service: Orthopedics;  Laterality: Right;   Family History  Problem Relation Age of Onset   Arthritis Mother    Diabetes Mother    Kidney disease Mother    Heart disease Mother    Hypertension Mother    Arthritis Father    Hearing loss Father    Hypertension Father    Heart disease Father    Diabetes Sister    Heart disease Sister    Diabetes Daughter    Diabetes Maternal Aunt    Diabetes Maternal Grandmother    Heart Problems Brother    Heart Problems Brother    Heart Problems Brother    Heart attack Maternal Grandfather    Colon cancer Paternal Grandfather    Social History   Tobacco Use   Smoking status: Former    Packs/day: 2.00  Years: 50.00    Pack years: 100.00    Types: Cigarettes    Quit date: 2011    Years since quitting: 11.8   Smokeless tobacco: Never  Vaping Use   Vaping Use: Never used  Substance Use Topics   Alcohol use: No    Alcohol/week: 0.0 standard drinks   Drug use: No    Pertinent Clinical Results:  LABS: Labs reviewed: Acceptable for surgery.  Hospital Outpatient Visit on 08/13/2021  Component Date Value Ref Range Status   MRSA, PCR 08/13/2021 NEGATIVE  NEGATIVE Final   Staphylococcus aureus 08/13/2021 NEGATIVE  NEGATIVE Final   Comment: (NOTE) The Xpert SA Assay (FDA approved for NASAL specimens in patients 62 years of age and older), is one component of a comprehensive surveillance program. It is not intended to diagnose infection nor to guide or monitor treatment. Performed at [facility], [address] [place], [place] [address]    WBC 08/13/2021 9.8  4.0 - 10.5 K/uL Final   RBC 08/13/2021 4.61  4.22 - 5.81 MIL/uL Final   Hemoglobin 08/13/2021 12.4 (A)  13.0 - 17.0 g/dL Final   HCT 08/13/2021 38.0 (A)  39.0 - 52.0 % Final   MCV 08/13/2021 82.4  80.0 - 100.0 fL Final   MCH 08/13/2021 26.9  26.0 - 34.0 pg Final   MCHC 08/13/2021 32.6  30.0 - 36.0 g/dL Final   RDW 08/13/2021 14.6  11.5 - 15.5 % Final    Platelets 08/13/2021 239  150 - 400 K/uL Final   nRBC 08/13/2021 0.0  0.0 - 0.2 % Final   Neutrophils Relative % 08/13/2021 48  % Final   Neutro Abs 08/13/2021 4.7  1.7 - 7.7 K/uL Final   Lymphocytes Relative 08/13/2021 37  % Final   Lymphs Abs 08/13/2021 3.6  0.7 - 4.0 K/uL Final   Monocytes Relative 08/13/2021 9  % Final   Monocytes Absolute 08/13/2021 0.9  0.1 - 1.0 K/uL Final   Eosinophils Relative 08/13/2021 5  % Final   Eosinophils Absolute 08/13/2021 0.5  0.0 - 0.5 K/uL Final   Basophils Relative 08/13/2021 1  % Final   Basophils Absolute 08/13/2021 0.1  0.0 - 0.1 K/uL Final   Immature Granulocytes 08/13/2021 0  % Final   Abs Immature Granulocytes 08/13/2021 0.03  0.00 - 0.07 K/uL Final   Performed at [facility], [address] [place], [place] [address]   Sodium 08/13/2021 135  135 - 145 mmol/L Final   Potassium 08/13/2021 4.5  3.5 - 5.1 mmol/L Final   Chloride 08/13/2021 103  98 - 111 mmol/L Final   CO2 08/13/2021 25  22 - 32 mmol/L Final   Glucose, Bld 08/13/2021 110 (A)  70 - 99 mg/dL Final   Glucose reference range applies only to samples taken after fasting for at least 8 hours.   BUN 08/13/2021 23  8 - 23 mg/dL Final   Creatinine, Ser 08/13/2021 1.15  0.61 - 1.24 mg/dL Final   Calcium 08/13/2021 10.2  8.9 - 10.3 mg/dL Final   Total Protein 08/13/2021 8.0  6.5 - 8.1 g/dL Final   Albumin 08/13/2021 4.1  3.5 - 5.0 g/dL Final   AST 08/13/2021 34  15 - 41 U/L Final   ALT 08/13/2021 28  0 - 44 U/L Final   Alkaline Phosphatase 08/13/2021 67  38 - 126 U/L Final   Total Bilirubin 08/13/2021 0.8  0.3 - 1.2 mg/dL Final   GFR, Estimated 08/13/2021 >60  >60  mL/min Final   Comment: (NOTE) Calculated using the CKD-EPI Creatinine Equation (2021)    Anion gap 08/13/2021 7  5 - 15 Final   Performed at [facility], [place], [place] [address]   Color, Urine 08/13/2021 YELLOW (A)  YELLOW Final   APPearance 08/13/2021 CLEAR (A)  CLEAR Final    Specific Gravity, Urine 08/13/2021 1.024  1.005 - 1.030 Final   pH 08/13/2021 5.0  5.0 - 8.0 Final   Glucose, UA 08/13/2021 NEGATIVE  NEGATIVE mg/dL Final   Hgb urine dipstick 08/13/2021 NEGATIVE  NEGATIVE Final   Bilirubin Urine 08/13/2021 NEGATIVE  NEGATIVE Final   Ketones, ur 08/13/2021 NEGATIVE  NEGATIVE mg/dL Final   Protein, ur 08/13/2021 NEGATIVE  NEGATIVE mg/dL Final   Nitrite 08/13/2021 NEGATIVE  NEGATIVE Final   Leukocytes,Ua 08/13/2021 NEGATIVE  NEGATIVE Final   Performed at [facility], [address] [place], [place] [address]    ECG: Date: 03/26/2021 Time ECG obtained: 1028 AM Rate: 93 bpm Rhythm:  Sinus rhythm with PVCs Axis (leads I and aVF): Normal Intervals: PR 198 ms. QRS 78 ms. QTc 435 ms. ST segment and T wave changes: No evidence of acute ST segment elevation or depression Comparison: Similar to previous tracing obtained on 09/20/2020    IMAGING / PROCEDURES: TRANSTHORACIC ECHOCARDIOGRAM performed on 12/14/2019 Left ventricular ejection fraction, by estimation, is 60-65%.  The left ventricle has normal function.  The left ventricle has no regional wall motion abnormalities.  Left ventricular diastolic parameters are consistent with Grade I diastolic dysfunction (impaired relaxation).  Right ventricular systolic function is normal.  The right ventricular size is normal.  Tricuspid regurgitation signal is inadequate for assessing PA pressure.   LEFT HEART CATHETERIZATION AND CORONARY ANGIOGRAPHY performed on 09/05/2019 Two-vessel CAD 60% stenosis of the proximal LAD that is hemodynamically significant (iFR 0.87) 20% stenosis of the proximal to mid LCx 50% stenosis of the RPAV Normal left ventricular systolic function and filling pressure Successful PCI 2.75 x 18 mm Resolute Onyx DES x 1 placed to the proximal/mid LAD Recommendations DAPT therapy (ASA + clopidogrel) for at least 12 months Aggressive secondary prevention Medical management of  the rPL disease  Impression and Plan:  Edward Flesher. has been referred for pre-anesthesia review and clearance prior to him undergoing the planned anesthetic and procedural courses. Available labs, pertinent testing, and imaging results were personally reviewed by me. This patient has been appropriately cleared by cardiology with an overall ACCEPTABLE risk of significant perioperative cardiovascular complications.  Based on clinical review performed today (08/14/21), barring any significant acute changes in the patient's overall condition, it is anticipated that he will be able to proceed with the planned surgical intervention. Any acute changes in clinical condition may necessitate his procedure being postponed and/or cancelled. Patient will meet with anesthesia team (MD and/or CRNA) on the day of his procedure for preoperative evaluation/assessment. Questions regarding anesthetic course will be fielded at that time.   Pre-surgical instructions were reviewed with the patient during his PAT appointment and questions were fielded by PAT clinical staff. Patient was advised that if any questions or concerns arise prior to his procedure then he should return a call to PAT and/or his surgeon's office to discuss.  Honor Loh, MSN, APRN, FNP-C, CEN [facility]  Peri-operative Services Nurse Practitioner Phone: [phone] Fax: [phone] 08/14/21 12:27 PM  NOTE: This note has been prepared using Dragon dictation software. Despite my best ability to proofread, there  is always the potential that unintentional transcriptional errors may still occur from this process.

## 2021-08-13 NOTE — Telephone Encounter (Signed)
   Name: Edward Schneider.  DOB: 12-15-1955  MRN: [id number]   Primary Cardiologist: Nelva Bush, MD  Chart reviewed as part of pre-operative protocol coverage. Patient was contacted 08/13/2021 in reference to pre-operative risk assessment for pending surgery as outlined below.  Edward Schneider. was last seen on 03/26/21 by Dr. Saunders Revel, chart reviewed. In 05/2021 there was a clearance received for an unrelated orthopedic surgery. As the patient was unable to achieve 4 METS of activity due to his hip and knee problems, Dr. Saunders Revel was contacted who replied "I think it is reasonable for Edward Schneider to proceed with orthopedic surgery as planned without additional cardiac testing/intervention.  Unless he is high risk for significant bleeding complications, I recommend that aspirin 81 mg daily be continued during the perioperative period." I reached out to patient to discuss interim stability/symptoms but got VM. LMTCB.  Charlie Pitter, PA-C 08/13/2021, 3:23 PM

## 2021-08-13 NOTE — Patient Instructions (Addendum)
Your procedure is scheduled on:08-22-21 Friday Report to the Registration Desk on the 1st floor of the [facility].Then proceed to the 2nd floor Surgery Desk in the [facility] To find out your arrival time, please call [phone] between 1PM - 3PM on:08-21-21 Thursday  REMEMBER: Instructions that are not followed completely may result in serious medical risk, up to and including death; or upon the discretion of your surgeon and anesthesiologist your surgery may need to be rescheduled.  Do not eat food after midnight the night before surgery.  No gum chewing, lozengers or hard candies.  You may however, drink Water up to 2 hours before you are scheduled to arrive for your surgery. Do not drink anything within 2 hours of your scheduled arrival time.  Type 1 and Type 2 diabetics should only drink water  TAKE THESE MEDICATIONS THE MORNING OF SURGERY WITH A SIP OF WATER: -cetirizine (ZYRTEC) 10 MG tablet -DULoxetine (CYMBALTA) 30 MG capsule -ezetimibe (ZETIA) 10 MG tablet -isosorbide mononitrate (IMDUR) 60 MG 24 hr tablet -pantoprazole (PROTONIX) 40 MG tablet -pregabalin (LYRICA) 50 MG capsule -oxyCODONE (ROXICODONE) 15 MG immediate release tablet -cyclobenzaprine (FLEXERIL) 10 MG tablet  Use albuterol (VENTOLIN HFA) 108 (90 Base) MCG/ACT inhaler on the day of surgery and bring to the hospital.   aspirin EC 81 MG tablet was stopped on Sunday (08-10-21)  Take half of your Insulin Glargine (BASAGLAR KWIKPEN) 100 UNIT/ML (16 units) the night before surgery and NO insulin the morning of surgery  Stop your dapagliflozin propanediol (FARXIGA) 5 MG TABS tablet 3 days prior to surgery-Last dose on 08-19-21 Tuesday  One week prior to surgery: Stop Anti-inflammatories (NSAIDS) such as Advil, Aleve, Ibuprofen, Motrin, Naproxen, Naprosyn and Aspirin based products such as Excedrin, Goodys Powder, BC Powder.You may however, continue to take Tylenol/Tramadol if needed for pain up until the day of  surgery.  Stop ANY OVER THE COUNTER supplements 7 days prior to surgery -(Ascorbic Acid (VITAMIN C) 1000 MG tablet, calcium carbonate (OS-CAL) 600 MG TABS tablet, Cholecalciferol (VITAMIN D3) 25 MCG (1000 UT) CHEW, CINNAMON PO, Ginger, Ginseng 100 MG CAPS, Multiple Vitamins-Minerals (CENTRUM SILVER 50+MEN) TABS, Omega-3 1000 MG CAPS, vitamin A 10000 UNIT capsule, vitamin B-12 (CYANOCOBALAMIN) 1000 MCG tablet, Vitamin E 400 units TABS)  No Alcohol for 24 hours before or after surgery.  No Smoking including e-cigarettes for 24 hours prior to surgery.  No chewable tobacco products for at least 6 hours prior to surgery.  No nicotine patches on the day of surgery.  Do not use any "recreational" drugs for at least a week prior to your surgery.  Please be advised that the combination of cocaine and anesthesia may have negative outcomes, up to and including death. If you test positive for cocaine, your surgery will be cancelled.  On the morning of surgery brush your teeth with toothpaste and water, you may rinse your mouth with mouthwash if you wish. Do not swallow any toothpaste or mouthwash.  Use CHG Soap as directed on instruction sheet.  Do not wear jewelry, make-up, hairpins, clips or nail polish.  Do not wear lotions, powders, or perfumes.   Do not shave body from the neck down 48 hours prior to surgery just in case you cut yourself which could leave a site for infection.  Also, freshly shaved skin may become irritated if using the CHG soap.  Contact lenses, hearing aids and dentures may not be worn into surgery.  Do not bring valuables to the hospital. [facility] is  not responsible for any missing/lost belongings or valuables.   Bring your C-PAP to the hospital with you  Notify your doctor if there is any change in your medical condition (cold, fever, infection).  Wear comfortable clothing (specific to your surgery type) to the hospital.  After surgery, you can help prevent lung  complications by doing breathing exercises.  Take deep breaths and cough every 1-2 hours. Your doctor may order a device called an Incentive Spirometer to help you take deep breaths. When coughing or sneezing, hold a pillow firmly against your incision with both hands. This is called "splinting." Doing this helps protect your incision. It also decreases belly discomfort.  If you are being admitted to the hospital overnight, leave your suitcase in the car. After surgery it may be brought to your room.  If you are being discharged the day of surgery, you will not be allowed to drive home. You will need a responsible adult (18 years or older) to drive you home and stay with you that night.   If you are taking public transportation, you will need to have a responsible adult (18 years or older) with you. Please confirm with your physician that it is acceptable to use public transportation.   Please call the [facility]. at [phone] if you have any questions about these instructions.  Surgery Visitation Policy:  Patients undergoing a surgery or procedure may have one family member or support person with them as long as that person is not COVID-19 positive or experiencing its symptoms.  That person may remain in the waiting area during the procedure and may rotate out with other people.  Inpatient Visitation:    Visiting hours are 7 a.m. to 8 p.m. Up to two visitors ages 16+ are allowed at one time in a patient room. The visitors may rotate out with other people during the day. Visitors must check out when they leave, or other visitors will not be allowed. One designated support person may remain overnight. The visitor must pass COVID-19 screenings, use hand sanitizer when entering and exiting the patient's room and wear a mask at all times, including in the patient's room. Patients must also wear a mask when staff or their visitor are in the room. Masking is required  regardless of vaccination status.

## 2021-08-13 NOTE — Telephone Encounter (Signed)
Tried to return call. Got VM again. LMTCB tomorrow when office reopens.

## 2021-08-13 NOTE — Telephone Encounter (Signed)
Patient returning call.

## 2021-08-13 NOTE — Telephone Encounter (Signed)
Request for pre-operative cardiac clearance Received: Today Karen Kitchens, NP  P Cv Div Preop Callback Request for pre-operative cardiac clearance:     1. What type of surgery is being performed?  RIGHT TOTAL KNEE ARTHROPLASTY   2. When is this surgery scheduled?  08/22/2021. Note, patient cleared for elective orthopedic surgery back in 05/2021. Given history and length of time since clearance was initially issued, updated clearance being sought.     3. Are there any medications that need to be held prior to surgery?  ASA   4. Practice name and name of physician performing surgery?  Performing surgeon: Dr. Hessie Knows, MD  Requesting clearance: Honor Loh, FNP-C       5. Anesthesia type (none, local, MAC, general)? General   6. What is the office phone and fax number?    Phone: [phone]  Fax: [phone]   ATTENTION: Unable to create telephone message as per your standard workflow. Directed by [facility] providers to send requests for cardiac clearance to this pool for appropriate distribution to provider covering pre-operative clearances.   Honor Loh, MSN, APRN, FNP-C, CEN  [facility]  Peri-operative Services Nurse Practitioner  Phone: [phone]  08/13/21 2:36 PM

## 2021-08-14 NOTE — Telephone Encounter (Signed)
   Name: Edward Schneider.  DOB: 16-Dec-1955  MRN: [id number]   Primary Cardiologist: Nelva Bush, MD  Chart reviewed as part of pre-operative protocol coverage. Patient was contacted 08/14/2021 in reference to pre-operative risk assessment for pending surgery as outlined below.  Edward Schneider. was last seen on 03/26/21 by Dr. Saunders Revel.  Since that day, Edward Schneider. has done well from a cardiac standpoint. Since his hip surgery 05/2021 he has been able to walk and do yard work again. He can complete 4 Mets without anginal complaints.  Therefore, based on ACC/AHA guidelines, the patient would be at acceptable risk for the planned procedure without further cardiovascular testing.   The patient was advised that if he develops new symptoms prior to surgery to contact our office to arrange for a follow-up visit, and he verbalized understanding.  Prefer patient remain on aspirin throughout the perioperative period.   I will route this recommendation to the requesting party via Epic fax function and remove from pre-op pool. Please call with questions.  Abigail Butts, PA-C 08/14/2021, 10:46 AM

## 2021-08-18 ENCOUNTER — Other Ambulatory Visit: Payer: Medicare Other

## 2021-08-20 ENCOUNTER — Other Ambulatory Visit
Admission: RE | Admit: 2021-08-20 | Discharge: 2021-08-20 | Disposition: A | Discharge status: Home / Self Care | Payer: Medicare Other | Source: Ambulatory Visit | Attending: Orthopedic Surgery | Admitting: Orthopedic Surgery

## 2021-08-20 ENCOUNTER — Other Ambulatory Visit: Payer: Self-pay

## 2021-08-20 DIAGNOSIS — Z20822 Contact with and (suspected) exposure to covid-19: Secondary | ICD-10-CM | POA: Insufficient documentation

## 2021-08-20 DIAGNOSIS — Z01812 Encounter for preprocedural laboratory examination: Secondary | ICD-10-CM | POA: Insufficient documentation

## 2021-08-20 LAB — SARS CORONAVIRUS 2 (TAT 6-24 HRS): SARS Coronavirus 2: NEGATIVE

## 2021-08-21 ENCOUNTER — Other Ambulatory Visit: Payer: Self-pay | Admitting: Nurse Practitioner

## 2021-08-21 NOTE — Telephone Encounter (Signed)
Requested Prescriptions  Pending Prescriptions Disp Refills  . sucralfate (CARAFATE) 1 g tablet [Pharmacy Med Name: Sucralfate 1 GM Oral Tablet] 360 tablet 0    Sig: TAKE 1 TABLET BY MOUTH 4 TIMES DAILY WITH MEALS AND AT BEDTIME     Gastroenterology: Antiacids Passed - 08/21/2021  1:35 PM      Passed - Valid encounter within last 12 months    Recent Outpatient Visits          1 week ago [place], Lauren A, NP   1 month ago Uncontrolled type 2 diabetes mellitus with hyperglycemia ([place])   [place], [place] T, NP   4 months ago Generalized abdominal pain   [place], [place] T, NP   5 months ago Uncontrolled type 2 diabetes mellitus with hyperglycemia ([place])   [place], Jolene T, NP   8 months ago Uncontrolled type 2 diabetes mellitus with hyperglycemia ([place])   [place], Barbaraann Faster, NP      Future Appointments            In 1 month [place], Harrell Gave, MD [facility], [place]   In 1 month [place], Barbaraann Faster, NP [employer], [facility]

## 2021-08-22 ENCOUNTER — Other Ambulatory Visit: Payer: Self-pay

## 2021-08-22 ENCOUNTER — Ambulatory Visit: Payer: Medicare Other | Admitting: Urgent Care

## 2021-08-22 ENCOUNTER — Inpatient Hospital Stay
Admission: AD | Admit: 2021-08-22 | Discharge: 2021-08-26 | DRG: 470 | Disposition: A | Discharge status: Home Health | Payer: Medicare Other | Attending: Orthopedic Surgery | Admitting: Orthopedic Surgery

## 2021-08-22 ENCOUNTER — Encounter
Admission: AD | Disposition: A | Discharge status: Home Health | Payer: Self-pay | Source: Home / Self Care | Attending: Orthopedic Surgery

## 2021-08-22 ENCOUNTER — Encounter: Payer: Self-pay | Admitting: Orthopedic Surgery

## 2021-08-22 ENCOUNTER — Observation Stay: Payer: Medicare Other

## 2021-08-22 DIAGNOSIS — M549 Dorsalgia, unspecified: Secondary | ICD-10-CM | POA: Diagnosis present

## 2021-08-22 DIAGNOSIS — M1711 Unilateral primary osteoarthritis, right knee: Secondary | ICD-10-CM | POA: Diagnosis not present

## 2021-08-22 DIAGNOSIS — Z7982 Long term (current) use of aspirin: Secondary | ICD-10-CM

## 2021-08-22 DIAGNOSIS — Z86010 Personal history of colonic polyps: Secondary | ICD-10-CM

## 2021-08-22 DIAGNOSIS — Z96651 Presence of right artificial knee joint: Secondary | ICD-10-CM

## 2021-08-22 DIAGNOSIS — Z96641 Presence of right artificial hip joint: Secondary | ICD-10-CM | POA: Diagnosis present

## 2021-08-22 DIAGNOSIS — I110 Hypertensive heart disease with heart failure: Secondary | ICD-10-CM | POA: Diagnosis not present

## 2021-08-22 DIAGNOSIS — Z7902 Long term (current) use of antithrombotics/antiplatelets: Secondary | ICD-10-CM | POA: Diagnosis not present

## 2021-08-22 DIAGNOSIS — G4730 Sleep apnea, unspecified: Secondary | ICD-10-CM | POA: Diagnosis present

## 2021-08-22 DIAGNOSIS — R079 Chest pain, unspecified: Secondary | ICD-10-CM | POA: Diagnosis present

## 2021-08-22 DIAGNOSIS — I5032 Chronic diastolic (congestive) heart failure: Secondary | ICD-10-CM | POA: Diagnosis not present

## 2021-08-22 DIAGNOSIS — G8929 Other chronic pain: Secondary | ICD-10-CM | POA: Diagnosis present

## 2021-08-22 DIAGNOSIS — F32A Depression, unspecified: Secondary | ICD-10-CM | POA: Diagnosis present

## 2021-08-22 DIAGNOSIS — E1165 Type 2 diabetes mellitus with hyperglycemia: Secondary | ICD-10-CM | POA: Diagnosis present

## 2021-08-22 DIAGNOSIS — J439 Emphysema, unspecified: Secondary | ICD-10-CM | POA: Diagnosis not present

## 2021-08-22 DIAGNOSIS — R569 Unspecified convulsions: Secondary | ICD-10-CM

## 2021-08-22 DIAGNOSIS — I2510 Atherosclerotic heart disease of native coronary artery without angina pectoris: Secondary | ICD-10-CM | POA: Diagnosis not present

## 2021-08-22 DIAGNOSIS — Z8673 Personal history of transient ischemic attack (TIA), and cerebral infarction without residual deficits: Secondary | ICD-10-CM

## 2021-08-22 DIAGNOSIS — J45909 Unspecified asthma, uncomplicated: Secondary | ICD-10-CM | POA: Diagnosis present

## 2021-08-22 DIAGNOSIS — G4733 Obstructive sleep apnea (adult) (pediatric): Secondary | ICD-10-CM | POA: Diagnosis present

## 2021-08-22 DIAGNOSIS — I252 Old myocardial infarction: Secondary | ICD-10-CM

## 2021-08-22 DIAGNOSIS — K219 Gastro-esophageal reflux disease without esophagitis: Secondary | ICD-10-CM | POA: Diagnosis present

## 2021-08-22 DIAGNOSIS — I152 Hypertension secondary to endocrine disorders: Secondary | ICD-10-CM | POA: Diagnosis present

## 2021-08-22 DIAGNOSIS — Z87891 Personal history of nicotine dependence: Secondary | ICD-10-CM

## 2021-08-22 DIAGNOSIS — Z981 Arthrodesis status: Secondary | ICD-10-CM

## 2021-08-22 DIAGNOSIS — J449 Chronic obstructive pulmonary disease, unspecified: Secondary | ICD-10-CM | POA: Diagnosis present

## 2021-08-22 DIAGNOSIS — Z885 Allergy status to narcotic agent status: Secondary | ICD-10-CM

## 2021-08-22 DIAGNOSIS — E785 Hyperlipidemia, unspecified: Secondary | ICD-10-CM | POA: Diagnosis present

## 2021-08-22 DIAGNOSIS — Z888 Allergy status to other drugs, medicaments and biological substances status: Secondary | ICD-10-CM

## 2021-08-22 DIAGNOSIS — G8918 Other acute postprocedural pain: Secondary | ICD-10-CM

## 2021-08-22 DIAGNOSIS — Z794 Long term (current) use of insulin: Secondary | ICD-10-CM

## 2021-08-22 DIAGNOSIS — Z91013 Allergy to seafood: Secondary | ICD-10-CM

## 2021-08-22 DIAGNOSIS — Z20822 Contact with and (suspected) exposure to covid-19: Secondary | ICD-10-CM | POA: Diagnosis present

## 2021-08-22 DIAGNOSIS — I4891 Unspecified atrial fibrillation: Secondary | ICD-10-CM

## 2021-08-22 DIAGNOSIS — E1169 Type 2 diabetes mellitus with other specified complication: Secondary | ICD-10-CM | POA: Diagnosis present

## 2021-08-22 DIAGNOSIS — Z8249 Family history of ischemic heart disease and other diseases of the circulatory system: Secondary | ICD-10-CM

## 2021-08-22 DIAGNOSIS — N400 Enlarged prostate without lower urinary tract symptoms: Secondary | ICD-10-CM | POA: Diagnosis present

## 2021-08-22 DIAGNOSIS — Z9049 Acquired absence of other specified parts of digestive tract: Secondary | ICD-10-CM

## 2021-08-22 DIAGNOSIS — E1159 Type 2 diabetes mellitus with other circulatory complications: Secondary | ICD-10-CM | POA: Diagnosis present

## 2021-08-22 DIAGNOSIS — I700 Atherosclerosis of aorta: Secondary | ICD-10-CM | POA: Diagnosis present

## 2021-08-22 DIAGNOSIS — Z79899 Other long term (current) drug therapy: Secondary | ICD-10-CM

## 2021-08-22 DIAGNOSIS — I4729 Other ventricular tachycardia: Secondary | ICD-10-CM | POA: Diagnosis not present

## 2021-08-22 DIAGNOSIS — Z91030 Bee allergy status: Secondary | ICD-10-CM

## 2021-08-22 DIAGNOSIS — Z8505 Personal history of malignant neoplasm of liver: Secondary | ICD-10-CM

## 2021-08-22 DIAGNOSIS — Z833 Family history of diabetes mellitus: Secondary | ICD-10-CM

## 2021-08-22 HISTORY — PX: TOTAL KNEE ARTHROPLASTY: SHX125

## 2021-08-22 LAB — CBC
HCT: 36.6 % — ABNORMAL LOW (ref 39.0–52.0)
Hemoglobin: 11.5 g/dL — ABNORMAL LOW (ref 13.0–17.0)
MCH: 26.4 pg (ref 26.0–34.0)
MCHC: 31.4 g/dL (ref 30.0–36.0)
MCV: 83.9 fL (ref 80.0–100.0)
Platelets: 218 10*3/uL (ref 150–400)
RBC: 4.36 MIL/uL (ref 4.22–5.81)
RDW: 14.4 % (ref 11.5–15.5)
WBC: 12.9 10*3/uL — ABNORMAL HIGH (ref 4.0–10.5)
nRBC: 0 % (ref 0.0–0.2)

## 2021-08-22 LAB — GLUCOSE, CAPILLARY
Glucose-Capillary: 133 mg/dL — ABNORMAL HIGH (ref 70–99)
Glucose-Capillary: 186 mg/dL — ABNORMAL HIGH (ref 70–99)
Glucose-Capillary: 252 mg/dL — ABNORMAL HIGH (ref 70–99)

## 2021-08-22 LAB — CREATININE, SERUM
Creatinine, Ser: 1.07 mg/dL (ref 0.61–1.24)
GFR, Estimated: >60 mL/min (ref >60)

## 2021-08-22 SURGERY — ARTHROPLASTY, KNEE, TOTAL
Anesthesia: General | Site: Knee | Laterality: Right

## 2021-08-22 MED ORDER — ALUM & MAG HYDROXIDE-SIMETH 200-200-20 MG/5ML PO SUSP: 30.0000 mL | ORAL | Status: DC | PRN

## 2021-08-22 MED ORDER — METHOCARBAMOL 500 MG PO TABS
ORAL_TABLET | ORAL | Status: AC
  Filled 2021-08-22: qty 1

## 2021-08-22 MED ORDER — DOCUSATE SODIUM 100 MG PO CAPS
100.0000 mg | ORAL_CAPSULE | Freq: Two times a day (BID) | ORAL | Status: DC
  Administered 2021-08-22 – 2021-08-26 (×7): 100 mg via ORAL
  Filled 2021-08-22 (×8): qty 1

## 2021-08-22 MED ORDER — TRAMADOL HCL 50 MG PO TABS
50.0000 mg | ORAL_TABLET | Freq: Four times a day (QID) | ORAL | Status: DC
  Administered 2021-08-22 – 2021-08-26 (×15): 50 mg via ORAL
  Filled 2021-08-22 (×15): qty 1

## 2021-08-22 MED ORDER — BUPIVACAINE HCL (PF) 0.5 % IJ SOLN
INTRAMUSCULAR | Status: DC | PRN
  Administered 2021-08-22: 3 mL

## 2021-08-22 MED ORDER — PROPOFOL 10 MG/ML IV BOLUS
INTRAVENOUS | Status: DC | PRN
  Administered 2021-08-22: 200 mg via INTRAVENOUS

## 2021-08-22 MED ORDER — BISACODYL 10 MG RE SUPP: 10.0000 mg | Freq: Every day | RECTAL | Status: DC | PRN

## 2021-08-22 MED ORDER — CEFAZOLIN SODIUM-DEXTROSE 2-4 GM/100ML-% IV SOLN
INTRAVENOUS | Status: AC
  Filled 2021-08-22: qty 100

## 2021-08-22 MED ORDER — PANTOPRAZOLE SODIUM 40 MG PO TBEC
40.0000 mg | DELAYED_RELEASE_TABLET | Freq: Two times a day (BID) | ORAL | Status: DC
  Administered 2021-08-22 – 2021-08-26 (×8): 40 mg via ORAL
  Filled 2021-08-22 (×8): qty 1

## 2021-08-22 MED ORDER — ACETAMINOPHEN 500 MG PO TABS
1000.0000 mg | ORAL_TABLET | Freq: Four times a day (QID) | ORAL | Status: AC
  Administered 2021-08-22 – 2021-08-23 (×3): 1000 mg via ORAL
  Filled 2021-08-22 (×4): qty 2

## 2021-08-22 MED ORDER — PANTOPRAZOLE SODIUM 40 MG PO TBEC: 40.0000 mg | DELAYED_RELEASE_TABLET | Freq: Every day | ORAL | Status: DC

## 2021-08-22 MED ORDER — HYDROMORPHONE HCL 1 MG/ML IJ SOLN
INTRAMUSCULAR | Status: AC
  Administered 2021-08-22: 0.5 mg via INTRAVENOUS
  Filled 2021-08-22: qty 1

## 2021-08-22 MED ORDER — SUCCINYLCHOLINE CHLORIDE 200 MG/10ML IV SOSY
PREFILLED_SYRINGE | INTRAVENOUS | Status: DC | PRN
  Administered 2021-08-22: 100 mg via INTRAVENOUS

## 2021-08-22 MED ORDER — LORATADINE 10 MG PO TABS
10.0000 mg | ORAL_TABLET | Freq: Every day | ORAL | Status: DC
  Administered 2021-08-23 – 2021-08-26 (×4): 10 mg via ORAL
  Filled 2021-08-22 (×4): qty 1

## 2021-08-22 MED ORDER — ALBUTEROL SULFATE (2.5 MG/3ML) 0.083% IN NEBU: 3.0000 mL | INHALATION_SOLUTION | Freq: Four times a day (QID) | RESPIRATORY_TRACT | Status: DC | PRN

## 2021-08-22 MED ORDER — OXYCODONE HCL 5 MG PO TABS: 10.0000 mg | ORAL_TABLET | ORAL | Status: DC | PRN

## 2021-08-22 MED ORDER — SODIUM CHLORIDE 0.9 % IV SOLN: INTRAVENOUS | Status: DC

## 2021-08-22 MED ORDER — SUCRALFATE 1 G PO TABS
1.0000 g | ORAL_TABLET | Freq: Three times a day (TID) | ORAL | Status: DC
  Administered 2021-08-22 – 2021-08-26 (×15): 1 g via ORAL
  Filled 2021-08-22 (×15): qty 1

## 2021-08-22 MED ORDER — INSULIN ASPART 100 UNIT/ML IJ SOLN
0.0000 [IU] | Freq: Three times a day (TID) | INTRAMUSCULAR | Status: DC
  Administered 2021-08-23: 8 [IU] via SUBCUTANEOUS
  Administered 2021-08-23 – 2021-08-24 (×3): 5 [IU] via SUBCUTANEOUS
  Administered 2021-08-24 – 2021-08-25 (×4): 3 [IU] via SUBCUTANEOUS
  Filled 2021-08-22 (×8): qty 1

## 2021-08-22 MED ORDER — CHLORHEXIDINE GLUCONATE 0.12 % MT SOLN
OROMUCOSAL | Status: AC
  Filled 2021-08-22: qty 15

## 2021-08-22 MED ORDER — ASPIRIN EC 81 MG PO TBEC
81.0000 mg | DELAYED_RELEASE_TABLET | Freq: Every day | ORAL | Status: DC
  Administered 2021-08-22 – 2021-08-26 (×5): 81 mg via ORAL
  Filled 2021-08-22 (×4): qty 1

## 2021-08-22 MED ORDER — SENNOSIDES-DOCUSATE SODIUM 8.6-50 MG PO TABS
1.0000 | ORAL_TABLET | Freq: Every day | ORAL | Status: DC
  Administered 2021-08-22 – 2021-08-24 (×3): 1 via ORAL
  Filled 2021-08-22 (×4): qty 1

## 2021-08-22 MED ORDER — NEOMYCIN-POLYMYXIN B GU 40-200000 IR SOLN
Status: AC
  Filled 2021-08-22: qty 1

## 2021-08-22 MED ORDER — KETAMINE HCL 10 MG/ML IJ SOLN
INTRAMUSCULAR | Status: DC | PRN
  Administered 2021-08-22 (×2): 25 mg via INTRAVENOUS

## 2021-08-22 MED ORDER — DOXEPIN HCL 50 MG PO CAPS
50.0000 mg | ORAL_CAPSULE | Freq: Every day | ORAL | Status: DC
  Administered 2021-08-22 – 2021-08-25 (×4): 50 mg via ORAL
  Filled 2021-08-22 (×5): qty 1

## 2021-08-22 MED ORDER — DIPHENHYDRAMINE HCL 12.5 MG/5ML PO ELIX: 12.5000 mg | ORAL_SOLUTION | ORAL | Status: DC | PRN

## 2021-08-22 MED ORDER — INSULIN GLARGINE-YFGN 100 UNIT/ML ~~LOC~~ SOLN
32.0000 [IU] | Freq: Every day | SUBCUTANEOUS | Status: DC
  Administered 2021-08-22 – 2021-08-25 (×4): 32 [IU] via SUBCUTANEOUS
  Filled 2021-08-22 (×5): qty 0.32

## 2021-08-22 MED ORDER — ACETAMINOPHEN 325 MG PO TABS
325.0000 mg | ORAL_TABLET | Freq: Four times a day (QID) | ORAL | Status: DC | PRN
  Filled 2021-08-22: qty 2

## 2021-08-22 MED ORDER — FLEET ENEMA 7-19 GM/118ML RE ENEM: 1.0000 | ENEMA | Freq: Once | RECTAL | Status: DC | PRN

## 2021-08-22 MED ORDER — SODIUM CHLORIDE FLUSH 0.9 % IV SOLN
INTRAVENOUS | Status: AC
  Filled 2021-08-22: qty 40

## 2021-08-22 MED ORDER — KETAMINE HCL 50 MG/5ML IJ SOSY
PREFILLED_SYRINGE | INTRAMUSCULAR | Status: AC
  Filled 2021-08-22: qty 5

## 2021-08-22 MED ORDER — NITROGLYCERIN 0.4 MG SL SUBL: 0.4000 mg | SUBLINGUAL_TABLET | SUBLINGUAL | Status: DC | PRN

## 2021-08-22 MED ORDER — OXYCODONE HCL 5 MG PO TABS: 5.0000 mg | ORAL_TABLET | ORAL | Status: DC | PRN

## 2021-08-22 MED ORDER — ASCORBIC ACID 500 MG PO TABS
1000.0000 mg | ORAL_TABLET | Freq: Every day | ORAL | Status: DC
  Administered 2021-08-23 – 2021-08-26 (×4): 1000 mg via ORAL
  Filled 2021-08-22 (×4): qty 2

## 2021-08-22 MED ORDER — POLYETHYLENE GLYCOL 3350 17 G PO PACK
17.0000 g | PACK | Freq: Every day | ORAL | Status: DC | PRN
  Administered 2021-08-22: 17 g via ORAL
  Filled 2021-08-22: qty 1

## 2021-08-22 MED ORDER — ATORVASTATIN CALCIUM 20 MG PO TABS
80.0000 mg | ORAL_TABLET | Freq: Every day | ORAL | Status: DC
  Administered 2021-08-22 – 2021-08-25 (×4): 80 mg via ORAL
  Filled 2021-08-22 (×4): qty 4

## 2021-08-22 MED ORDER — ONDANSETRON HCL 4 MG/2ML IJ SOLN: 4.0000 mg | Freq: Once | INTRAMUSCULAR | Status: DC | PRN

## 2021-08-22 MED ORDER — METOPROLOL TARTRATE 5 MG/5ML IV SOLN
INTRAVENOUS | Status: DC | PRN
  Administered 2021-08-22: 2.5 mg via INTRAVENOUS

## 2021-08-22 MED ORDER — PROPOFOL 500 MG/50ML IV EMUL
INTRAVENOUS | Status: AC
  Filled 2021-08-22: qty 50

## 2021-08-22 MED ORDER — UMECLIDINIUM BROMIDE 62.5 MCG/ACT IN AEPB
1.0000 | INHALATION_SPRAY | Freq: Every day | RESPIRATORY_TRACT | Status: DC
  Administered 2021-08-23 – 2021-08-26 (×4): 1 via RESPIRATORY_TRACT
  Filled 2021-08-22 (×2): qty 7

## 2021-08-22 MED ORDER — METOCLOPRAMIDE HCL 5 MG/ML IJ SOLN: 5.0000 mg | Freq: Three times a day (TID) | INTRAMUSCULAR | Status: DC | PRN

## 2021-08-22 MED ORDER — DULOXETINE HCL 60 MG PO CPEP
60.0000 mg | ORAL_CAPSULE | Freq: Every day | ORAL | Status: DC
  Administered 2021-08-22 – 2021-08-25 (×4): 60 mg via ORAL
  Filled 2021-08-22 (×5): qty 1

## 2021-08-22 MED ORDER — ZOLPIDEM TARTRATE 5 MG PO TABS: 5.0000 mg | ORAL_TABLET | Freq: Every evening | ORAL | Status: DC | PRN

## 2021-08-22 MED ORDER — ONDANSETRON HCL 4 MG PO TABS
4.0000 mg | ORAL_TABLET | Freq: Four times a day (QID) | ORAL | Status: DC | PRN
  Administered 2021-08-22: 4 mg via ORAL
  Filled 2021-08-22: qty 1

## 2021-08-22 MED ORDER — PROPOFOL 10 MG/ML IV BOLUS
INTRAVENOUS | Status: AC
  Filled 2021-08-22: qty 20

## 2021-08-22 MED ORDER — DULAGLUTIDE 1.5 MG/0.5ML ~~LOC~~ SOAJ: 1.5000 mg | SUBCUTANEOUS | Status: DC

## 2021-08-22 MED ORDER — BASAGLAR KWIKPEN 100 UNIT/ML ~~LOC~~ SOPN: 32.0000 [IU] | PEN_INJECTOR | Freq: Every day | SUBCUTANEOUS | Status: DC

## 2021-08-22 MED ORDER — ONDANSETRON HCL 4 MG/2ML IJ SOLN
INTRAMUSCULAR | Status: DC | PRN
  Administered 2021-08-22: 4 mg via INTRAVENOUS

## 2021-08-22 MED ORDER — ACETAMINOPHEN 10 MG/ML IV SOLN
INTRAVENOUS | Status: DC | PRN
  Administered 2021-08-22: 1000 mg via INTRAVENOUS

## 2021-08-22 MED ORDER — ISOSORBIDE MONONITRATE ER 30 MG PO TB24
60.0000 mg | ORAL_TABLET | Freq: Every day | ORAL | Status: DC
  Administered 2021-08-23 – 2021-08-26 (×4): 60 mg via ORAL
  Filled 2021-08-22 (×4): qty 2

## 2021-08-22 MED ORDER — BENAZEPRIL HCL 10 MG PO TABS
10.0000 mg | ORAL_TABLET | Freq: Every day | ORAL | Status: DC
  Administered 2021-08-23 – 2021-08-26 (×4): 10 mg via ORAL
  Filled 2021-08-22 (×4): qty 1

## 2021-08-22 MED ORDER — BUPIVACAINE LIPOSOME 1.3 % IJ SUSP
INTRAMUSCULAR | Status: AC
  Filled 2021-08-22: qty 20

## 2021-08-22 MED ORDER — ONDANSETRON HCL 4 MG/2ML IJ SOLN: 4.0000 mg | Freq: Four times a day (QID) | INTRAMUSCULAR | Status: DC | PRN

## 2021-08-22 MED ORDER — ARFORMOTEROL TARTRATE 15 MCG/2ML IN NEBU
15.0000 ug | INHALATION_SOLUTION | Freq: Two times a day (BID) | RESPIRATORY_TRACT | Status: DC
  Administered 2021-08-22 – 2021-08-26 (×7): 15 ug via RESPIRATORY_TRACT
  Filled 2021-08-22 (×10): qty 2

## 2021-08-22 MED ORDER — DAPAGLIFLOZIN PROPANEDIOL 5 MG PO TABS
5.0000 mg | ORAL_TABLET | Freq: Every day | ORAL | Status: DC
  Administered 2021-08-24 – 2021-08-25 (×2): 5 mg via ORAL
  Filled 2021-08-22 (×3): qty 1

## 2021-08-22 MED ORDER — METHOCARBAMOL 1000 MG/10ML IJ SOLN
500.0000 mg | Freq: Four times a day (QID) | INTRAVENOUS | Status: DC | PRN
  Filled 2021-08-22: qty 5

## 2021-08-22 MED ORDER — TRAZODONE HCL 100 MG PO TABS
100.0000 mg | ORAL_TABLET | Freq: Every day | ORAL | Status: DC
  Administered 2021-08-22 – 2021-08-25 (×4): 100 mg via ORAL
  Filled 2021-08-22 (×4): qty 1

## 2021-08-22 MED ORDER — ACETAMINOPHEN 10 MG/ML IV SOLN
INTRAVENOUS | Status: AC
  Filled 2021-08-22: qty 100

## 2021-08-22 MED ORDER — INSULIN LISPRO 200 UNIT/ML ~~LOC~~ SOPN
16.0000 [IU] | PEN_INJECTOR | SUBCUTANEOUS | Status: DC
Start: 2021-08-22 — End: 2021-08-22

## 2021-08-22 MED ORDER — SODIUM CHLORIDE 0.9 % IR SOLN: Status: DC | PRN

## 2021-08-22 MED ORDER — HYDROMORPHONE HCL 1 MG/ML IJ SOLN
0.5000 mg | INTRAMUSCULAR | Status: DC | PRN
  Administered 2021-08-23 – 2021-08-24 (×5): 1 mg via INTRAVENOUS
  Filled 2021-08-22 (×5): qty 1

## 2021-08-22 MED ORDER — OXYCODONE HCL 5 MG PO TABS
ORAL_TABLET | ORAL | Status: AC
  Administered 2021-08-22: 15 mg via ORAL
  Filled 2021-08-22: qty 3

## 2021-08-22 MED ORDER — ENOXAPARIN SODIUM 30 MG/0.3ML IJ SOSY
30.0000 mg | PREFILLED_SYRINGE | Freq: Two times a day (BID) | INTRAMUSCULAR | Status: DC
  Administered 2021-08-23 – 2021-08-26 (×7): 30 mg via SUBCUTANEOUS
  Filled 2021-08-22 (×7): qty 0.3

## 2021-08-22 MED ORDER — MIDAZOLAM HCL 5 MG/5ML IJ SOLN
INTRAMUSCULAR | Status: DC | PRN
  Administered 2021-08-22 (×2): 1 mg via INTRAVENOUS

## 2021-08-22 MED ORDER — SODIUM CHLORIDE (PF) 0.9 % IJ SOLN
INTRAMUSCULAR | Status: DC | PRN
  Administered 2021-08-22: 91 mL

## 2021-08-22 MED ORDER — HYDROMORPHONE HCL 1 MG/ML IJ SOLN
INTRAMUSCULAR | Status: AC
  Filled 2021-08-22: qty 1

## 2021-08-22 MED ORDER — EZETIMIBE 10 MG PO TABS
10.0000 mg | ORAL_TABLET | Freq: Every day | ORAL | Status: DC
  Administered 2021-08-23 – 2021-08-26 (×4): 10 mg via ORAL
  Filled 2021-08-22 (×4): qty 1

## 2021-08-22 MED ORDER — PHENOL 1.4 % MT LIQD
1.0000 | OROMUCOSAL | Status: DC | PRN
  Filled 2021-08-22: qty 177

## 2021-08-22 MED ORDER — STERILE WATER FOR IRRIGATION IR SOLN
Status: DC | PRN
  Administered 2021-08-22: 1000 mL

## 2021-08-22 MED ORDER — SUGAMMADEX SODIUM 200 MG/2ML IV SOLN
INTRAVENOUS | Status: DC | PRN
  Administered 2021-08-22: 200 mg via INTRAVENOUS

## 2021-08-22 MED ORDER — LACTATED RINGERS IV SOLN: INTRAVENOUS | Status: DC

## 2021-08-22 MED ORDER — HYDROMORPHONE HCL 1 MG/ML IJ SOLN
INTRAMUSCULAR | Status: DC | PRN
  Administered 2021-08-22: 1 mg via INTRAVENOUS

## 2021-08-22 MED ORDER — PREGABALIN 50 MG PO CAPS
50.0000 mg | ORAL_CAPSULE | Freq: Three times a day (TID) | ORAL | Status: DC
  Administered 2021-08-22 – 2021-08-26 (×12): 50 mg via ORAL
  Filled 2021-08-22 (×12): qty 1

## 2021-08-22 MED ORDER — FENTANYL CITRATE (PF) 100 MCG/2ML IJ SOLN
INTRAMUSCULAR | Status: AC
  Filled 2021-08-22: qty 2

## 2021-08-22 MED ORDER — OXYCODONE HCL 5 MG PO TABS
10.0000 mg | ORAL_TABLET | ORAL | Status: DC | PRN
  Administered 2021-08-22 – 2021-08-26 (×19): 15 mg via ORAL
  Filled 2021-08-22 (×19): qty 3

## 2021-08-22 MED ORDER — PHENYLEPHRINE HCL (PRESSORS) 10 MG/ML IV SOLN
INTRAVENOUS | Status: DC | PRN
  Administered 2021-08-22 (×2): 100 ug via INTRAVENOUS

## 2021-08-22 MED ORDER — MIDAZOLAM HCL 2 MG/2ML IJ SOLN
INTRAMUSCULAR | Status: AC
  Filled 2021-08-22: qty 2

## 2021-08-22 MED ORDER — DULOXETINE HCL 30 MG PO CPEP
30.0000 mg | ORAL_CAPSULE | Freq: Two times a day (BID) | ORAL | Status: DC
  Administered 2021-08-23 – 2021-08-26 (×8): 30 mg via ORAL
  Filled 2021-08-22 (×12): qty 1

## 2021-08-22 MED ORDER — ROCURONIUM BROMIDE 100 MG/10ML IV SOLN
INTRAVENOUS | Status: DC | PRN
  Administered 2021-08-22: 40 mg via INTRAVENOUS

## 2021-08-22 MED ORDER — HYDROMORPHONE HCL 1 MG/ML IJ SOLN
0.2500 mg | INTRAMUSCULAR | Status: DC | PRN
  Administered 2021-08-22 (×2): 0.5 mg via INTRAVENOUS

## 2021-08-22 MED ORDER — BUPIVACAINE-EPINEPHRINE (PF) 0.25% -1:200000 IJ SOLN
INTRAMUSCULAR | Status: AC
  Filled 2021-08-22: qty 30

## 2021-08-22 MED ORDER — OXYCODONE HCL 5 MG PO TABS: 15.0000 mg | ORAL_TABLET | ORAL | Status: DC | PRN

## 2021-08-22 MED ORDER — METOCLOPRAMIDE HCL 10 MG PO TABS: 5.0000 mg | ORAL_TABLET | Freq: Three times a day (TID) | ORAL | Status: DC | PRN

## 2021-08-22 MED ORDER — CEFAZOLIN SODIUM-DEXTROSE 2-4 GM/100ML-% IV SOLN
2.0000 g | INTRAVENOUS | Status: AC
  Administered 2021-08-22: 2 g via INTRAVENOUS

## 2021-08-22 MED ORDER — ORAL CARE MOUTH RINSE: 15.0000 mL | Freq: Once | OROMUCOSAL | Status: DC

## 2021-08-22 MED ORDER — MENTHOL 3 MG MT LOZG
1.0000 | LOZENGE | OROMUCOSAL | Status: DC | PRN
  Filled 2021-08-22: qty 9

## 2021-08-22 MED ORDER — MORPHINE SULFATE (PF) 10 MG/ML IV SOLN
INTRAVENOUS | Status: AC
  Filled 2021-08-22: qty 1

## 2021-08-22 MED ORDER — CEFAZOLIN SODIUM-DEXTROSE 2-4 GM/100ML-% IV SOLN
2.0000 g | Freq: Four times a day (QID) | INTRAVENOUS | Status: AC
  Administered 2021-08-22 (×2): 2 g via INTRAVENOUS
  Filled 2021-08-22 (×2): qty 100

## 2021-08-22 MED ORDER — ACETAMINOPHEN 10 MG/ML IV SOLN: 1000.0000 mg | Freq: Once | INTRAVENOUS | Status: DC | PRN

## 2021-08-22 MED ORDER — METHOCARBAMOL 500 MG PO TABS
500.0000 mg | ORAL_TABLET | Freq: Four times a day (QID) | ORAL | Status: DC | PRN
  Administered 2021-08-22 – 2021-08-26 (×6): 500 mg via ORAL
  Filled 2021-08-22 (×5): qty 1

## 2021-08-22 SURGICAL SUPPLY — 71 items
APL PRP STRL LF DISP 70% ISPRP (MISCELLANEOUS) ×2
BLADE SAGITTAL 25.0X1.19X90 (BLADE) ×2 IMPLANT
BLADE SAW 90X13X1.19 OSCILLAT (BLADE) ×2 IMPLANT
BNDG ELASTIC 6X5.8 VLCR STR LF (GAUZE/BANDAGES/DRESSINGS) ×2 IMPLANT
CANISTER WOUND CARE 500ML ATS (WOUND CARE) ×2 IMPLANT
CEMENT FEMORAL COMP SZ 4+ RT (Femur) ×1 IMPLANT
CEMENT HV SMART SET (Cement) ×4 IMPLANT
CHLORAPREP W/TINT 26 (MISCELLANEOUS) ×4 IMPLANT
COOLER POLAR GLACIER W/PUMP (MISCELLANEOUS) ×2 IMPLANT
CUFF TOURN SGL QUICK 24 (TOURNIQUET CUFF)
CUFF TOURN SGL QUICK 34 (TOURNIQUET CUFF)
CUFF TRNQT CYL 24X4X16.5-23 (TOURNIQUET CUFF) IMPLANT
CUFF TRNQT CYL 34X4.125X (TOURNIQUET CUFF) IMPLANT
DRAPE 3/4 80X56 (DRAPES) ×4 IMPLANT
DRSG MEPILEX SACRM 8.7X9.8 (GAUZE/BANDAGES/DRESSINGS) ×2 IMPLANT
ELECT CAUTERY BLADE 6.4 (BLADE) ×2 IMPLANT
ELECT REM PT RETURN 9FT ADLT (ELECTROSURGICAL) ×2
ELECTRODE REM PT RTRN 9FT ADLT (ELECTROSURGICAL) ×1 IMPLANT
GAUZE 4X4 16PLY ~~LOC~~+RFID DBL (SPONGE) ×2 IMPLANT
GAUZE SPONGE 4X4 12PLY STRL (GAUZE/BANDAGES/DRESSINGS) ×2 IMPLANT
GAUZE XEROFORM 1X8 LF (GAUZE/BANDAGES/DRESSINGS) ×2 IMPLANT
GLOVE SURG ORTHO LTX SZ8 (GLOVE) ×2 IMPLANT
GLOVE SURG SYN 9.0  PF PI (GLOVE) ×2
GLOVE SURG SYN 9.0 PF PI (GLOVE) ×1 IMPLANT
GLOVE SURG UNDER LTX SZ8 (GLOVE) ×2 IMPLANT
GLOVE SURG UNDER POLY LF SZ9 (GLOVE) ×2 IMPLANT
GOWN SRG 2XL LVL 4 RGLN SLV (GOWNS) ×1 IMPLANT
GOWN STRL NON-REIN 2XL LVL4 (GOWNS) ×2
GOWN STRL REUS W/ TWL LRG LVL3 (GOWN DISPOSABLE) ×1 IMPLANT
GOWN STRL REUS W/ TWL XL LVL3 (GOWN DISPOSABLE) ×1 IMPLANT
GOWN STRL REUS W/TWL LRG LVL3 (GOWN DISPOSABLE) ×2
GOWN STRL REUS W/TWL XL LVL3 (GOWN DISPOSABLE) ×2
HOLDER FOLEY CATH W/STRAP (MISCELLANEOUS) ×2 IMPLANT
INSERT TIBIAL SZ4 RIGHT 10MM (Insert) ×1 IMPLANT
IRRIGATION SURGIPHOR STRL (IV SOLUTION) IMPLANT
IV NS IRRIG 3000ML ARTHROMATIC (IV SOLUTION) ×2 IMPLANT
KIT PREVENA INCISION MGT20CM45 (CANNISTER) ×2 IMPLANT
KIT TURNOVER KIT A (KITS) ×2 IMPLANT
MANIFOLD NEPTUNE II (INSTRUMENTS) ×2 IMPLANT
NDL SAFETY ECLIPSE 18X1.5 (NEEDLE) ×1 IMPLANT
NDL SPNL 18GX3.5 QUINCKE PK (NEEDLE) ×1 IMPLANT
NDL SPNL 20GX3.5 QUINCKE YW (NEEDLE) ×1 IMPLANT
NEEDLE HYPO 18GX1.5 SHARP (NEEDLE) ×2
NEEDLE SPNL 18GX3.5 QUINCKE PK (NEEDLE) ×2 IMPLANT
NEEDLE SPNL 20GX3.5 QUINCKE YW (NEEDLE) ×2 IMPLANT
NS IRRIG 1000ML POUR BTL (IV SOLUTION) ×2 IMPLANT
PACK TOTAL KNEE (MISCELLANEOUS) ×2 IMPLANT
PAD WRAPON POLAR KNEE (MISCELLANEOUS) ×1 IMPLANT
PATELLA RESURFACING MEDACTA 02 (Bone Implant) ×1 IMPLANT
PENCIL SMOKE EVACUATOR COATED (MISCELLANEOUS) ×2 IMPLANT
PULSAVAC PLUS IRRIG FAN TIP (DISPOSABLE) ×2
SCALPEL PROTECTED #10 DISP (BLADE) ×4 IMPLANT
SOLUTION PRONTOSAN WOUND 350ML (IRRIGATION / IRRIGATOR) ×1 IMPLANT
SPONGE T-LAP 18X18 ~~LOC~~+RFID (SPONGE) ×6 IMPLANT
STAPLER SKIN PROX 35W (STAPLE) ×2 IMPLANT
STEM EXTENSION 11MMX30MM (Stem) ×1 IMPLANT
SUCTION FRAZIER HANDLE 10FR (MISCELLANEOUS) ×2
SUCTION TUBE FRAZIER 10FR DISP (MISCELLANEOUS) ×1 IMPLANT
SUT DVC 2 QUILL PDO  T11 36X36 (SUTURE) ×2
SUT DVC 2 QUILL PDO T11 36X36 (SUTURE) ×1 IMPLANT
SUT ETHIBOND 2 V 37 (SUTURE) ×1 IMPLANT
SUT V-LOC 90 ABS DVC 3-0 CL (SUTURE) ×2 IMPLANT
SYR 20ML LL LF (SYRINGE) ×2 IMPLANT
SYR 50ML LL SCALE MARK (SYRINGE) ×4 IMPLANT
TIBIAL TRAY FIXED 4 MEDACTA 02 (Joint) ×1 IMPLANT
TIP FAN IRRIG PULSAVAC PLUS (DISPOSABLE) ×1 IMPLANT
TOWEL OR 17X26 4PK STRL BLUE (TOWEL DISPOSABLE) ×2 IMPLANT
TOWER CARTRIDGE SMART MIX (DISPOSABLE) ×2 IMPLANT
TRAY FOLEY MTR SLVR 16FR STAT (SET/KITS/TRAYS/PACK) ×2 IMPLANT
WATER STERILE IRR 500ML POUR (IV SOLUTION) ×2 IMPLANT
WRAPON POLAR PAD KNEE (MISCELLANEOUS) ×2
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
## 2021-08-22 NOTE — Anesthesia Procedure Notes (Signed)
Spinal  Patient location during procedure: OR Reason for block: surgical anesthesia Staffing Performed: resident/CRNA  Resident/CRNA: Shelita Steptoe, Einar Grad, CRNA Preanesthetic Checklist Completed: patient identified, IV checked, site marked, risks and benefits discussed, surgical consent, monitors and equipment checked, pre-op evaluation and timeout performed Spinal Block Patient position: sitting Prep: DuraPrep Patient monitoring: heart rate, cardiac monitor, continuous pulse ox and blood pressure Approach: midline Location: L3-4 Injection technique: single-shot Needle Needle type: Sprotte  Needle gauge: 24 G Needle length: 9 cm Assessment Sensory level: T4 Events: CSF return Additional Notes + clear csf, good flow, - heme, - parathesia, pt tolerated procedure well

## 2021-08-22 NOTE — Op Note (Signed)
08/22/2021  3:19 PM  PATIENT:  Edward Schneider.   MRN: [id number]  PRE-OPERATIVE DIAGNOSIS:  Primary localized osteoarthritis of right knee   POST-OPERATIVE DIAGNOSIS:  Same   PROCEDURE:  Procedure(s): Right TOTAL KNEE ARTHROPLASTY   SURGEON: Laurene Footman, MD   ASSISTANTS: Sheryn Bison, RN   ANESTHESIA:   spinal and general   EBL: 100   BLOOD ADMINISTERED:none   DRAINS:  Incisional wound VAC     LOCAL MEDICATIONS USED:  MARCAINE    and OTHER morphine and Exparel   SPECIMEN:  No Specimen   DISPOSITION OF SPECIMEN:  N/A   COUNTS:  YES   TOURNIQUET: 75 minutes at 300 mm Hg   IMPLANTS: Medacta  GMK sphere system with 4+ right femur, 4 tibia with short stem and 10 mm insert.  Size 2 patella, all components cemented.   DICTATION: Viviann Spare Dictation   patient was brought to the operating room and spinal anesthesia was attempted and then general anesthesia performed.  After prepping and draping the right leg in sterile fashion, and after patient identification and timeout procedures were completed, tourniquet was raised  and medial parapatellar skin incision was made secondary to a prior medial skin incision followed by medial parapatellar arthrotomy with severe medial compartment osteoarthritis, severe with sclerosis and significant loss of bone severe patellofemoral arthritis and severe lateral compartment arthritis, partial synovectomy was also carried out.   The ACL and PCL and fat pad were excised along with anterior horns of the meniscus. The proximal tibia cutting guide from  the extra medullary system was applied and the proximal tibia cut carried out.  The distal femoral cut was carried out in a similar fashion     The distal femoral cutting guide applied with sizing to a size 4+ and with anterior posterior and chamfer cuts made.  The posterior horns of the menisci were removed at this point.   Injection of the above medication was carried out after the femoral and tibial  cuts were carried out.  The 4 baseplate trial was placed pinned into position and proximal tibial preparation carried out with drilling hand reaming and the keel punch followed by placement of the 4+ femur and sizing the tibial insert size 10 millimeter gave the best fit with stability and full extension.  The distal femoral drill holes were made in the notch cut for the trochlear groove was then carried out with trials were then removed the patella was cut using the patellar cutting guide and it sized to a size 2after drill holes have been made  The knee was irrigated with pulsatile lavage and the bony surfaces dried the tibial component was cemented into place first.  Excess cement was removed and the polyethylene insert placed with a torque screw placed with a torque screwdriver tightened.  The distal femoral component was placed and the knee was held in extension as the patellar button was clamped into place.  After the cement was set, excess cement was removed and the knee was again irrigated thoroughly thoroughly irrigated.  The tourniquet was let down and hemostasis checked with electrocautery. The arthrotomy was repaired with a heavy Quill suture,  followed by 3-0 V lock subcuticular closure, skin staples followed by incisional wound VAC and Polar Care.Marland Kitchen   PLAN OF CARE: Admit for overnight observation   PATIENT DISPOSITION:  PACU - hemodynamically stable.

## 2021-08-22 NOTE — Anesthesia Preprocedure Evaluation (Addendum)
Anesthesia Evaluation  Patient identified by MRN, date of birth, ID band Patient awake    Reviewed: Allergy & Precautions, NPO status , Patient's Chart, lab work & pertinent test results  History of Anesthesia Complications Negative for: history of anesthetic complications  Airway Mallampati: III   Neck ROM: Full    Dental  (+) Edentulous Upper, Edentulous Lower   Pulmonary former smoker (quit 2012),    Pulmonary exam normal breath sounds clear to auscultation       Cardiovascular hypertension, + CAD (s/p MI and stents)  Normal cardiovascular exam Rhythm:Regular Rate:Normal  ECG 03/26/21: PVCs  Heart cath 09/05/19: two-vessel CAD with a 60% proximal LAD stenosis (iFR 0.87)and moderate disease involving therPLbranches. Left ventricular systolic function and filling pressure normal. PCI was performed and a 2.75 x 18 mm Resolute Onyx DESx 1was placed to the proximal/mid LAD resulting in a 0% residual stenosis and restoration of TIMI-3 flow.  TTE 12/14/19: normal left ventricular systolic function with an EF of 60 to 65%. Doppler parameters consistent with impaired relaxation (G1DD). There were no RWMAs or significant valvular abnormalities.   Neuro/Psych Seizures - (childhood),  TIA (age 8) Neuromuscular disease (peripheral neuropathy)    GI/Hepatic GERD  ,Liver CA   Endo/Other  diabetes, Type 2Obesity   Renal/GU negative Renal ROS     Musculoskeletal   Abdominal   Peds  Hematology negative hematology ROS (+)   Anesthesia Other Findings Reviewed 03/26/21 cardiology note.  Reviewed and agree with Bayard Males pre-anesthesia clinical review note.  Reproductive/Obstetrics                            Anesthesia Physical Anesthesia Plan  ASA: 3  Anesthesia Plan: General and Spinal   Post-op Pain Management:    Induction: Intravenous  PONV Risk Score and Plan: 2 and Ondansetron, Propofol  infusion, TIVA and Treatment may vary due to age or medical condition  Airway Management Planned: Natural Airway  Additional Equipment:   Intra-op Plan:   Post-operative Plan: Extubation in OR  Informed Consent: I have reviewed the patients History and Physical, chart, labs and discussed the procedure including the risks, benefits and alternatives for the proposed anesthesia with the patient or authorized representative who has indicated his/her understanding and acceptance.       Plan Discussed with: CRNA  Anesthesia Plan Comments: (LMA/GETA backup.)       Anesthesia Quick Evaluation

## 2021-08-22 NOTE — Plan of Care (Signed)

## 2021-08-22 NOTE — Anesthesia Postprocedure Evaluation (Signed)
Anesthesia Post Note  Patient: Edward Schneider.  Procedure(s) Performed: TOTAL KNEE ARTHROPLASTY (Right: Knee)  Patient location during evaluation: PACU Anesthesia Type: Spinal Level of consciousness: awake and alert, oriented and patient cooperative Pain management: pain level controlled Vital Signs Assessment: post-procedure vital signs reviewed and stable Respiratory status: spontaneous breathing, nonlabored ventilation and respiratory function stable Cardiovascular status: blood pressure returned to baseline and stable Postop Assessment: adequate PO intake Anesthetic complications: no   No notable events documented.   Last Vitals:  Vitals:   08/22/21 1515 08/22/21 1530  BP: 123/70 116/76  Pulse: 90 84  Resp: 18 15  Temp:    SpO2: 94% 99%    Last Pain:  Vitals:   08/22/21 1530  TempSrc:   PainSc: 0-No pain                 Darrin Nipper

## 2021-08-22 NOTE — Transfer of Care (Signed)
Immediate Anesthesia Transfer of Care Note  Patient: Edward Schneider.  Procedure(s) Performed: TOTAL KNEE ARTHROPLASTY (Right: Knee)  Patient Location: PACU  Anesthesia Type:General  Level of Consciousness: awake, alert  and oriented  Airway & Oxygen Therapy: Patient Spontanous Breathing  Post-op Assessment: Report given to RN and Post -op Vital signs reviewed and stable  Post vital signs: Reviewed and stable  Last Vitals:  Vitals Value Taken Time  BP    Temp    Pulse    Resp    SpO2      Last Pain:  Vitals:   08/22/21 1206  TempSrc: Tympanic         Complications: No notable events documented.
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
## 2021-08-23 LAB — CBC
HCT: 34.4 % — ABNORMAL LOW (ref 39.0–52.0)
Hemoglobin: 10.7 g/dL — ABNORMAL LOW (ref 13.0–17.0)
MCH: 26.8 pg (ref 26.0–34.0)
MCHC: 31.1 g/dL (ref 30.0–36.0)
MCV: 86 fL (ref 80.0–100.0)
Platelets: 215 10*3/uL (ref 150–400)
RBC: 4 MIL/uL — ABNORMAL LOW (ref 4.22–5.81)
RDW: 14.6 % (ref 11.5–15.5)
WBC: 12.2 10*3/uL — ABNORMAL HIGH (ref 4.0–10.5)
nRBC: 0 % (ref 0.0–0.2)

## 2021-08-23 LAB — BASIC METABOLIC PANEL
Anion gap: 8 (ref 5–15)
BUN: 13 mg/dL (ref 8–23)
CO2: 26 mmol/L (ref 22–32)
Calcium: 8.5 mg/dL — ABNORMAL LOW (ref 8.9–10.3)
Chloride: 103 mmol/L (ref 98–111)
Creatinine, Ser: 0.99 mg/dL (ref 0.61–1.24)
GFR, Estimated: >60 mL/min (ref >60)
Glucose, Bld: 168 mg/dL — ABNORMAL HIGH (ref 70–99)
Potassium: 4.5 mmol/L (ref 3.5–5.1)
Sodium: 137 mmol/L (ref 135–145)

## 2021-08-23 LAB — GLUCOSE, CAPILLARY
Glucose-Capillary: 202 mg/dL — ABNORMAL HIGH (ref 70–99)
Glucose-Capillary: 264 mg/dL — ABNORMAL HIGH (ref 70–99)
Glucose-Capillary: 235 mg/dL — ABNORMAL HIGH (ref 70–99)
Glucose-Capillary: 168 mg/dL — ABNORMAL HIGH (ref 70–99)
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
## 2021-08-23 NOTE — TOC Initial Note (Signed)
Transition of Care (TOC) - Initial/Assessment Note    Patient Details  Name: Edward Schneider. MRN: [id number] Date of Birth: 29-Jan-1956  Transition of Care [facility]) CM/SW Contact:    Harriet Masson, RN Phone Number:[phone] 08/23/2021, 10:08 AM  Clinical Narrative:                 Recommended for PT: Spoke with pt today and completed the initial assessment. Introduced [employer]  RN nd expressed the recommendations for HHPT. Pt indicated he has used a services in the past and would like to use this agency again. Spoke with [place] Corene Cornea) who verified services with the pt in the past. Agency will accept pt once again for his discharge needs (HHPT). RN notified the pt that [facility] will services his HHPT upon his discharged from the hospital.  Pt indicates he has a very supportive family with his spouse in the home who will be his source of transportation and able to afford all his medications with no delays. Pt verified DME in the home with a WR,2 canes, 3-1 commode and a shower chair with no additional needs at this time.  Potential discharged date for tomorrow reported by the pt. TOC will continue to follow up with team in preparation pending pt's discharge on Sunday.  Expected Discharge Plan: [place] Barriers to Discharge: Barriers Resolved   Patient Goals and CMS Choice     Choice offered to / list presented to : Patient  Expected Discharge Plan and Services Expected Discharge Plan: [place]   Discharge Planning Services: CM Consult Post Acute Care Choice: [place] arrangements for the past 2 months: [place]: PT [place]: [place] ([place]) Date [place]: 08/23/21 Time [place]: 1006 Representative spoke with at [place]: Floydene Flock  Prior Living Arrangements/Services Living arrangements for the past 2 months:  [place] Lives with:: Spouse Patient language and need for interpreter reviewed:: Yes Do you feel safe going back to the place where you live?: Yes      Need for Family Participation in Patient Care: Yes (Comment) Care giver support system in place?: Yes (comment)   Criminal Activity/Legal Involvement Pertinent to Current Situation/Hospitalization: No - Comment as needed  Activities of Daily Living Home Assistive Devices/Equipment: Walker (specify type), Cane (specify quad or straight), CPAP, Eyeglasses, Dentures (specify type) (upper and lower) ADL Screening (condition at time of admission) Patient's cognitive ability adequate to safely complete daily activities?: Yes Is the patient deaf or have difficulty hearing?: No Does the patient have difficulty seeing, even when wearing glasses/contacts?: No Does the patient have difficulty concentrating, remembering, or making decisions?: No Patient able to express need for assistance with ADLs?: Yes Does the patient have difficulty dressing or bathing?: No Independently performs ADLs?: Yes (appropriate for developmental age) Does the patient have difficulty walking or climbing stairs?: Yes Weakness of Legs: None Weakness of Arms/Hands: None  Permission Sought/Granted Permission sought to share information with : Case Manager, Customer service manager Permission granted to share information with : Yes, Verbal Permission Granted              Emotional Assessment Appearance:: Appears stated age Attitude/Demeanor/Rapport: Engaged Affect (typically observed): Accepting Orientation: : Oriented to Place, Oriented to  Self, Oriented to  Time, Oriented to Situation Alcohol / Substance Use: Not Applicable Psych Involvement: No (comment)  Admission diagnosis:  S/P TKR (total knee replacement) using cement, right [Z96.651] Patient Active Problem List   Diagnosis Date Noted   S/P TKR (total knee replacement) using cement, right  08/22/2021   Status post total hip replacement, right 06/03/2021   Senile purpura ([place]) 12/06/2020   Family history of malignant neoplasm of gastrointestinal tract    Depression, major, single episode, moderate ([place]) 03/06/2020   Uncontrolled type 2 diabetes mellitus with hyperglycemia ([place]) 10/29/2019   Coronary artery disease of native artery of native heart with stable angina pectoris ([place]) 08/31/2019   Pain due to onychomycosis of toenails of both feet 08/10/2019   Morbid obesity ([place]) 05/17/2019   BPH (benign prostatic hyperplasia) 04/27/2018   Knee pain, right 02/08/2018   Sleep apnea 02/08/2018   GERD (gastroesophageal reflux disease) 04/04/2017   Advanced care planning/counseling discussion 03/29/2017   DM type 2 with diabetic peripheral neuropathy ([place]) 09/30/2016   Insomnia 12/24/2015   Hyperlipidemia associated with type 2 diabetes mellitus ([place]) 09/23/2015   Bilateral carotid artery stenosis 09/11/2015   Atherosclerosis of abdominal aorta ([place]) 08/26/2015   Migraine headache 08/21/2015   Emphysema lung ([place]) 04/25/2015   Hypertension associated with diabetes ([place]) 04/25/2015   DDD (degenerative disc disease), cervical 03/27/2015   DDD (degenerative disc disease), lumbar 03/27/2015   Cervical post-laminectomy syndrome 03/27/2015   Pancreatic insufficiency 01/28/2015   Chronic back pain 12/24/2014   PCP:  [name], NP Pharmacy:   [facility] [address], [place] [employer] [employer] [place] [address] Phone: [phone] Fax: [phone]     Social Determinants of Health (SDOH) Interventions    Readmission Risk Interventions No flowsheet data found.
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
## 2021-08-23 NOTE — Progress Notes (Signed)
PT Cancellation Note  Patient Details Name: Edward Schneider. MRN: [id number] DOB: 1956-03-29   Cancelled Treatment:    Reason Eval/Treat Not Completed: Patient declined, no reason specified. PT treatment attempted - room dark upon entering.Ppt states that it is too early and that he didn't sleep well last night. PT educated pt is BID and would like to see pt before lunch. Pt agreeable to later this morning. Will re-attempt before lunch.    Patrina Levering PT, DPT

## 2021-08-23 NOTE — Progress Notes (Signed)
Subjective: 1 Day Post-Op Procedure(s) (LRB): TOTAL KNEE ARTHROPLASTY (Right) Patient reports pain as moderate.   Patient is well, and has had no acute complaints or problems Denies any CP, SOB, ABD pain. We will continue therapy today.  Plan is to go Home after hospital stay.  Objective: Vital signs in last 24 hours: Temp:  [97.3 F (36.3 C)-98.3 F (36.8 C)] 97.9 F (36.6 C) (11/05 0505) Pulse Rate:  [73-101] 100 (11/05 0505) Resp:  [10-20] 17 (11/05 0505) BP: (116-144)/(67-94) 128/94 (11/05 0505) SpO2:  [92 %-100 %] 92 % (11/05 0505) Weight:  [732 kg] 108 kg (11/04 1206)  Intake/Output from previous day: 11/04 0701 - 11/05 0700 In: 1965.9 [I.V.:1765.9; IV Piggyback:200] Out: 1200 [Urine:1100; Blood:100] Intake/Output this shift: No intake/output data recorded.  Recent Labs    08/22/21 1644 08/23/21 0403  HGB 11.5* 10.7*   Recent Labs    08/22/21 1644 08/23/21 0403  WBC 12.9* 12.2*  RBC 4.36 4.00*  HCT 36.6* 34.4*  PLT 218 215   Recent Labs    08/22/21 1644 08/23/21 0403  NA  --  137  K  --  4.5  CL  --  103  CO2  --  26  BUN  --  13  CREATININE 1.07 0.99  GLUCOSE  --  168*  CALCIUM  --  8.5*   No results for input(s): LABPT, INR in the last 72 hours.  EXAM General - Patient is Alert, Appropriate, and Oriented Extremity - Neurovascular intact Sensation intact distally Intact pulses distally Dorsiflexion/Plantar flexion intact No cellulitis present Compartment soft Dressing - dressing C/D/I and no drainage, Praveena intact without drainage Motor Function - intact, moving foot and toes well on exam.   Past Medical History:  Diagnosis Date   Allergy    Aortic atherosclerosis (HCC)    Asthma    C. difficile diarrhea    Chronic pain    Collagenous colitis    Coronary artery disease    a.) PCI with 2.75 x 18 mm Resolute Onyx DES x 1 to prox/mid LAD on 09/05/2019   DDD (degenerative disc disease), cervical    DDD (degenerative disc  disease), lumbar    GERD (gastroesophageal reflux disease)    Grade I diastolic dysfunction    Hepatic steatosis    Hyperlipidemia    Hypertension    Liver cancer ([place]) 03/2015   Migraines    Myocardial infarction (HCC)     OSA on CPAP    Seizures (HCC)    several as child when sick.  None since age 88   Stroke [facility])    'mini-stroke" 30 yrs ago. no deficits.   T2DM (type 2 diabetes mellitus) ([facility])    Wears dentures    full upper and lower    Assessment/Plan:   1 Day Post-Op Procedure(s) (LRB): TOTAL KNEE ARTHROPLASTY (Right) Active Problems:   S/P TKR (total knee replacement) using cement, right  Estimated body mass index is 36.2 kg/m as calculated from the following:   Height as of this encounter: 5\' 8"  (1.727 m).   Weight as of this encounter: 108 kg. Advance diet Up with therapy Work on bowel movement Pain moderate.  Continue with current pain regimen.  Continue to monitor vital signs.  Currently vital signs are stable Labs are stable Care management to assist with discharge to home with home health PT likely Sunday  DVT Prophylaxis - Lovenox, Foot Pumps, and TED hose Weight-Bearing as tolerated to right leg   T. Gerald Stabs  [facility] [place] 08/23/2021, 7:01 AM

## 2021-08-24 LAB — GLUCOSE, CAPILLARY
Glucose-Capillary: 189 mg/dL — ABNORMAL HIGH (ref 70–99)
Glucose-Capillary: 216 mg/dL — ABNORMAL HIGH (ref 70–99)
Glucose-Capillary: 152 mg/dL — ABNORMAL HIGH (ref 70–99)
Glucose-Capillary: 170 mg/dL — ABNORMAL HIGH (ref 70–99)

## 2021-08-24 MED ORDER — OXYCODONE HCL 10 MG PO TABS
10.0000 mg | ORAL_TABLET | ORAL | 0 refills | Status: DC | PRN
Start: 2021-08-24 — End: 2022-03-06

## 2021-08-24 MED ORDER — MAGNESIUM HYDROXIDE 400 MG/5ML PO SUSP
30.0000 mL | Freq: Once | ORAL | Status: AC
  Administered 2021-08-24: 30 mL via ORAL
  Filled 2021-08-24: qty 30

## 2021-08-24 MED ORDER — TRAMADOL HCL 50 MG PO TABS: 50.0000 mg | ORAL_TABLET | Freq: Four times a day (QID) | ORAL | 0 refills | Status: DC | PRN

## 2021-08-24 MED ORDER — ENOXAPARIN SODIUM 40 MG/0.4ML IJ SOSY: 40.0000 mg | PREFILLED_SYRINGE | INTRAMUSCULAR | 0 refills | Status: DC

## 2021-08-24 NOTE — Progress Notes (Signed)
Inpatient Diabetes Program Recommendations  AACE/ADA: New Consensus Statement on Inpatient Glycemic Control (2015)  Target Ranges:  Prepandial:   less than 140 mg/dL      Peak postprandial:   less than 180 mg/dL (1-2 hours)      Critically ill patients:  140 - 180 mg/dL   Lab Results  Component Value Date   GLUCAP 189 (H) 08/24/2021   HGBA1C 7.6 (H) 05/05/2021    Review of Glycemic Control  Diabetes history: DM2 Outpatient Diabetes medications: Farxiga 5 QD, Trulicity 1.5 mg weekly, Basaglar 32 units QHS, Humalog 22-16-16 units TID Current orders for Inpatient glycemic control: Farxiga 5 mg QD, Semglee 32 units QHS, Novolog 0-15 units TID with meals  HgbA1C - 7.6% Post-prandials elevated  Inpatient Diabetes Program Recommendations:    Add Novolog 8 units TID with meals if eating > 50% meal  Follow glucose trends.  Thank you. Lorenda Peck, RD, LDN, CDE Inpatient Diabetes Coordinator [phone]

## 2021-08-24 NOTE — Discharge Instructions (Addendum)
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
## 2021-08-24 NOTE — Discharge Summary (Signed)
Physician Discharge Summary  Patient ID: Edward Schneider. MRN: [id number] DOB/AGE: 64/11/1955 64 y.o.  Admit date: 08/22/2021 Discharge date: 08/26/2021  Admission Diagnoses:  S/P TKR (total knee replacement) using cement, right [Z96.651]   Discharge Diagnoses: Patient Active Problem List   Diagnosis Date Noted   Atrial fibrillation with rapid ventricular response ([place])    Chest pain 08/25/2021   Asthma    CAD (coronary artery disease)    Chronic diastolic CHF (congestive heart failure) ([place])    Seizure (HCC)    S/P TKR (total knee replacement) using cement, right 08/22/2021   Status post total hip replacement, right 06/03/2021   Senile purpura ([place]) 12/06/2020   Family history of malignant neoplasm of gastrointestinal tract    Depression 05/29/2020   Depression, major, single episode, moderate ([place]) 03/06/2020   Uncontrolled type 2 diabetes mellitus with hyperglycemia ([place]) 10/29/2019   Coronary artery disease of native artery of native heart with stable angina pectoris ([place]) 08/31/2019   Pain due to onychomycosis of toenails of both feet 08/10/2019   Morbid obesity ([place]) 05/17/2019   BPH (benign prostatic hyperplasia) 04/27/2018   Knee pain, right 02/08/2018   Sleep apnea 02/08/2018   GERD (gastroesophageal reflux disease) 04/04/2017   Advanced care planning/counseling discussion 03/29/2017   DM type 2 with diabetic peripheral neuropathy ([place]) 09/30/2016   Insomnia 12/24/2015   Hyperlipidemia associated with type 2 diabetes mellitus ([place]) 09/23/2015   Bilateral carotid artery stenosis 09/11/2015   Atherosclerosis of abdominal aorta ([place]) 08/26/2015   Migraine headache 08/21/2015   Emphysema lung ([place]) 04/25/2015   Hypertension associated with diabetes ([place]) 04/25/2015   DDD (degenerative disc disease), cervical 03/27/2015   DDD (degenerative disc disease), lumbar 03/27/2015   Cervical post-laminectomy syndrome 03/27/2015   Pancreatic insufficiency 01/28/2015    Chronic back pain 12/24/2014    Past Medical History:  Diagnosis Date   Allergy    Aortic atherosclerosis (HCC)    Asthma    C. difficile diarrhea    Chronic pain    Collagenous colitis    Coronary artery disease    a.) PCI with 2.75 x 18 mm Resolute Onyx DES x 1 to prox/mid LAD on 09/05/2019   DDD (degenerative disc disease), cervical    DDD (degenerative disc disease), lumbar    GERD (gastroesophageal reflux disease)    Grade I diastolic dysfunction    Hepatic steatosis    Hyperlipidemia    Hypertension    Liver cancer ([place]) 03/2015   Migraines    Myocardial infarction (HCC)     OSA on CPAP    Seizures ([place])    several as child when sick.  None since age 33   Stroke [facility])    'mini-stroke" 30 yrs ago. no deficits.   T2DM (type 2 diabetes mellitus) ([place])    Wears dentures    full upper and lower     Transfusion: none   Consultants (if any): Treatment Team:  Alla Feeling, [facility] Team 9, MD Val Riles, MD  Discharged Condition: Improved  Hospital Course: Jaeveon Ashland. is an 65 y.o. male who was admitted 08/22/2021 with a diagnosis of right knee osteoarthritis and went to the operating room on 08/22/2021 and underwent the above named procedures.    Surgeries: Procedure(s): TOTAL KNEE ARTHROPLASTY on 08/22/2021 Patient tolerated the surgery well. Taken to PACU where she was stabilized and then transferred to the orthopedic floor.  Started on Lovenox 30 mg q 12 hrs. Foot pumps applied bilaterally at 80 mm.  Heels elevated on bed with rolled towels. No evidence of DVT. Negative [place]. Physical therapy started on day #1 for gait training and transfer. OT started day #1 for ADL and assisted devices.  Patient's foley was d/c on day #1. Patient's IV was d/c on day #2.  On post op day #3 patient was noted to have seizure activity along with some chest pain and elevated heart rate.  EKG normal, serial troponins were negative.  CT chest negative for PE.  Patient's symptoms  resolved very quickly.  Work-up was negative.  Patient made excellent progress of physical therapy on postop day 4, vital signs are stable with no chest pain or shortness of breath.  On postop day 4 patient was stable and ready for discharge to home with home health PT  Implants: Medacta  GMK sphere system with 4+ right femur, 4 tibia with short stem and 10 mm insert.  Size 2 patella, all components cemented.    He was given perioperative antibiotics:  Anti-infectives (From admission, onward)    Start     Dose/Rate Route Frequency Ordered Stop   08/22/21 1800  ceFAZolin (ANCEF) IVPB 2g/100 mL premix        2 g 200 mL/hr over 30 Minutes Intravenous Every 6 hours 08/22/21 1631 08/23/21 0756   08/22/21 1153  ceFAZolin (ANCEF) 2-4 GM/100ML-% IVPB       Note to Pharmacy: [place] [facility], [place]: cabinet override      08/22/21 1153 08/22/21 1318   08/22/21 0600  ceFAZolin (ANCEF) IVPB 2g/100 mL premix        2 g 200 mL/hr over 30 Minutes Intravenous On call to O.R. 08/22/21 0134 08/22/21 1300     .  He was given sequential compression devices, early ambulation, and Lovenox TEDs for DVT prophylaxis.  He benefited maximally from the hospital stay and there were no complications.    Recent vital signs:  Vitals:   08/26/21 1133 08/26/21 1513  BP: 131/81 122/76  Pulse: (!) 109 (!) 107  Resp: 16 15  Temp: 98.2 F (36.8 C) 98.9 F (37.2 C)  SpO2: 96% 95%    Recent laboratory studies:  Lab Results  Component Value Date   HGB 10.0 (L) 08/26/2021   HGB 8.9 (L) 08/25/2021   HGB 10.7 (L) 08/23/2021   Lab Results  Component Value Date   WBC 11.0 (H) 08/26/2021   PLT 283 08/26/2021   Lab Results  Component Value Date   INR 0.93 04/03/2017   Lab Results  Component Value Date   NA 133 (L) 08/26/2021   K 3.6 08/26/2021   CL 97 (L) 08/26/2021   CO2 23 08/26/2021   BUN 20 08/26/2021   CREATININE 1.20 08/26/2021   GLUCOSE 268 (H) 08/26/2021    Discharge Medications:   Allergies  as of 08/26/2021       Reactions   Bee Venom Anaphylaxis   Crestor [rosuvastatin Calcium] Shortness Of Breath, Swelling   Fentanyl Itching, Hives   blisters Patch   Gabapentin Diarrhea   Severe diarrhea which caused incontinence, loss of appetite and weight loss.   Shellfish Allergy Anaphylaxis, Swelling   Shrimp causes throat to swell and tingling in tongue.    Furosemide Nausea And Vomiting   Buprenorphine Hcl Itching   Chlorhexidine Gluconate Itching, Rash   Simvastatin Diarrhea        Medication List     STOP taking these medications    EPINEPHrine 0.3 mg/0.3 mL Soaj injection Commonly known as:  EPI-PEN   methocarbamol 500 MG tablet Commonly known as: ROBAXIN   Nurtec 75 MG Tbdp Generic drug: Rimegepant Sulfate   sulfamethoxazole-trimethoprim 800-160 MG tablet Commonly known as: BACTRIM DS       TAKE these medications    acetaminophen 650 MG CR tablet Commonly known as: TYLENOL Take 1,300 mg by mouth every 8 (eight) hours.   albuterol 108 (90 Base) MCG/ACT inhaler Commonly known as: Ventolin HFA Inhale 2 puffs into the lungs every 6 (six) hours as needed for wheezing or shortness of breath.   aspirin EC 81 MG tablet Take 81 mg by mouth daily. Swallow whole.   atorvastatin 80 MG tablet Commonly known as: LIPITOR TAKE 1 TABLET BY MOUTH AT BEDTIME   Basaglar KwikPen 100 UNIT/ML Inject 32 Units into the skin at bedtime.   benazepril 10 MG tablet Commonly known as: LOTENSIN Take 1 tablet (10 mg total) by mouth daily. What changed: when to take this   calcium carbonate 600 MG Tabs tablet Commonly known as: OS-CAL Take 600 mg by mouth daily.   Centrum Silver 50+Men Tabs Take 1 tablet by mouth daily.   cetirizine 10 MG tablet Commonly known as: ZYRTEC Take 10 mg by mouth daily.   CINNAMON PO Take 1,000 mg by mouth 2 (two) times daily.   clobetasol cream 0.05 % Commonly known as: TEMOVATE Apply 1 application topically 2 (two) times daily.    cyclobenzaprine 10 MG tablet Commonly known as: FLEXERIL Take 1 tablet (10 mg total) by mouth 3 (three) times daily as needed for muscle spasms. What changed: when to take this   dapagliflozin propanediol 5 MG Tabs tablet Commonly known as: Farxiga Take 1 tablet (5 mg total) by mouth daily.   diclofenac Sodium 1 % Gel Commonly known as: VOLTAREN Apply 2 g topically 4 (four) times daily as needed (pain).   diphenhydrAMINE 25 mg capsule Commonly known as: BENADRYL Take 50 mg by mouth in the morning, at noon, and at bedtime.   docusate sodium 100 MG capsule Commonly known as: COLACE Take 1 capsule (100 mg total) by mouth 2 (two) times daily.   doxepin 25 MG capsule Commonly known as: SINEQUAN Take 50 mg by mouth at bedtime.   DULoxetine 60 MG capsule Commonly known as: CYMBALTA Take 1 capsule (60 mg total) by mouth at bedtime.   DULoxetine 30 MG capsule Commonly known as: CYMBALTA Take 30 mg by mouth 2 (two) times daily with breakfast and lunch.   enoxaparin 40 MG/0.4ML injection Commonly known as: LOVENOX Inject 0.4 mLs (40 mg total) into the skin daily for 14 days.   ezetimibe 10 MG tablet Commonly known as: ZETIA Take 1 tablet by mouth once daily What changed: when to take this   FreeStyle Libre 2 Sensor Misc Use as instructed to check blood sugar daily   GINGER PO Take 1 tablet by mouth daily.   Ginseng 100 MG Caps Take 100 mg by mouth daily.   HumaLOG KwikPen 200 UNIT/ML KwikPen Generic drug: insulin lispro Inject 16-22 Units into the skin See admin instructions. Injecting 22 units with Breakfast, 16 units with lunch and supper   isosorbide mononitrate 60 MG 24 hr tablet Commonly known as: IMDUR Take 1 tablet (60 mg total) by mouth daily. What changed: when to take this   mupirocin ointment 2 % Commonly known as: BACTROBAN Apply 1 application topically daily as needed.   Narcan 4 MG/0.1ML Liqd nasal spray kit Generic drug: naloxone Place 0.4 mg  into  the nose as needed (opioid overdose).   nitroGLYCERIN 0.4 MG SL tablet Commonly known as: NITROSTAT Place 0.4 mg under the tongue every 5 (five) minutes as needed for chest pain.   Omega-3 1000 MG Caps Take 1,000 mg by mouth daily.   Oxycodone HCl 10 MG Tabs Take 1-1.5 tablets (10-15 mg total) by mouth every 3 (three) hours as needed for severe pain (pain score 7-10). What changed:  medication strength how much to take when to take this reasons to take this additional instructions   pantoprazole 40 MG tablet Commonly known as: PROTONIX Take 1 tablet by mouth twice daily   pregabalin 50 MG capsule Commonly known as: LYRICA TAKE 1 CAPSULE BY MOUTH THREE TIMES DAILY   PRESCRIPTION MEDICATION CPAP   senna-docusate 8.6-50 MG tablet Commonly known as: Senokot-S Take 1 tablet by mouth at bedtime as needed for mild constipation. What changed: when to take this   Stiolto Respimat 2.5-2.5 MCG/ACT Aers Generic drug: Tiotropium Bromide-Olodaterol Inhale 2 puffs into the lungs daily as needed (shortness of breath).   sucralfate 1 g tablet Commonly known as: CARAFATE TAKE 1 TABLET BY MOUTH 4 TIMES DAILY WITH MEALS AND AT BEDTIME   traMADol 50 MG tablet Commonly known as: ULTRAM Take 1 tablet (50 mg total) by mouth every 6 (six) hours as needed. What changed: reasons to take this   traZODone 100 MG tablet Commonly known as: DESYREL TAKE 1 TABLET BY MOUTH AT BEDTIME AS NEEDED FOR SLEEP What changed:  when to take this additional instructions   Trulicity 1.5 EX/9.3ZJ Sopn Generic drug: Dulaglutide Inject 1.5 mg into the skin once a week. Friday   vitamin A 10000 UNIT capsule Take 10,000 Units by mouth daily.   vitamin B-12 1000 MCG tablet Commonly known as: CYANOCOBALAMIN Take 1,000 mcg by mouth daily.   vitamin C 1000 MG tablet Take 1,000 mg by mouth daily.   Vitamin D (Ergocalciferol) 1.25 MG (50000 UNIT) Caps capsule Commonly known as: DRISDOL Take 1  capsule (50,000 Units total) by mouth every 7 (seven) days.   Vitamin D3 25 MCG (1000 UT) Chew Chew 1,000 Units by mouth daily.   Vitamin E 400 units Tabs Take 400 Units by mouth daily.               Durable Medical Equipment  (From admission, onward)           Start     Ordered   08/22/21 1632  DME Walker rolling  Once       Question Answer Comment  Walker: With 5 Inch Wheels   Patient needs a walker to treat with the following condition S/P TKR (total knee replacement) using cement, right      08/22/21 1631   08/22/21 1632  DME 3 n 1  Once        08/22/21 1631   08/22/21 1632  DME Bedside commode  Once       Question:  Patient needs a bedside commode to treat with the following condition  Answer:  S/P TKR (total knee replacement) using cement, right   08/22/21 1631            Diagnostic Studies: DG Knee 1-2 Views Right  Result Date: 08/22/2021 CLINICAL DATA:  Status post right knee arthroplasty EXAM: RIGHT KNEE - 1-2 VIEW COMPARISON:  None. FINDINGS: Status post right knee total arthroplasty with expected overlying postoperative change. No evidence of perihardware fracture or component malpositioning. IMPRESSION: Status post right knee total  arthroplasty with expected overlying postoperative change. No evidence of perihardware fracture or component malpositioning. Electronically Signed   By: Delanna Ahmadi M.D.   On: 08/22/2021 15:50   CT Angio Chest Pulmonary Embolism (PE) W or WO Contrast  Result Date: 08/25/2021 CLINICAL DATA:  Postop x3 days, chest pain, tachycardia, shortness of breath, evaluate for PE. EXAM: CT ANGIOGRAPHY CHEST WITH CONTRAST TECHNIQUE: Multidetector CT imaging of the chest was performed using the standard protocol during bolus administration of intravenous contrast. Multiplanar CT image reconstructions and MIPs were obtained to evaluate the vascular anatomy. CONTRAST:  39m OMNIPAQUE IOHEXOL 350 MG/ML SOLN COMPARISON:  05/29/2020. FINDINGS:  Cardiovascular: Satisfactory opacification of the bilateral pulmonary emboli to the segmental level. No evidence of pulmonary embolism. Although not tailored for evaluation of the thoracic aorta, there is no evidence of thoracic aortic aneurysm or dissection. The heart is normal in size.  No pericardial effusion. Three vessel coronary atherosclerosis. Mediastinum/Nodes: No suspicious mediastinal lymphadenopathy. Visualized thyroid is unremarkable. Lungs/Pleura: Minimal bibasilar atelectasis. No focal consolidation. No suspicious pulmonary nodules. No pleural effusion or pneumothorax. Upper Abdomen: Visualized upper abdomen is notable for mild hepatic steatosis. Musculoskeletal: Mild degenerative changes of the lower thoracic spine. Review of the MIP images confirms the above findings. IMPRESSION: No evidence of pulmonary embolism. No evidence of acute cardiopulmonary disease. Aortic Atherosclerosis (ICD10-I70.0). Electronically Signed   By: SJulian HyM.D.   On: 08/25/2021 17:25   ECHOCARDIOGRAM COMPLETE  Result Date: 08/26/2021    ECHOCARDIOGRAM REPORT   Patient Name:   LClarke Peretz Date of Exam: 08/26/2021 Medical Rec #:  [id number]        Height:       68.0 in Accession #:    [id number]       Weight:       238.1 lb Date of Birth:  [birth year]        BSA:          2.201 m Patient Age:    89years          BP:           Not listed in chart/Not listed                                                 in chart mmHg Patient Gender: M                 HR:           Not listed in chart bpm. Exam Location:  [facility] Procedure: 2D Echo, Cardiac Doppler and Color Doppler Indications:     Ventricular Tachycardia I47.2  History:         Patient has prior history of Echocardiogram examinations, most                  recent 12/14/2019. CAD; Risk Factors:Hypertension, Diabetes and                  Dyslipidemia.  Sonographer:     JSherrie SportReferring Phys:  [id number] RilesDiagnosing Phys: CNelva BushMD   Sonographer Comments: Suboptimal apical window. IMPRESSIONS  1. Left ventricular ejection fraction, by estimation, is 60 to 65%. The left ventricle has normal function. The left ventricle has no regional wall motion abnormalities. There is mild left ventricular hypertrophy. Indeterminate diastolic filling  due to E-A fusion.  2. Right ventricular systolic function is normal. The right ventricular size is normal. Mildly increased right ventricular wall thickness. Tricuspid regurgitation signal is inadequate for assessing PA pressure.  3. Right atrial size was mildly dilated.  4. The mitral valve is abnormal. No evidence of mitral valve regurgitation. No evidence of mitral stenosis.  5. The aortic valve has an indeterminant number of cusps. There is mild calcification of the aortic valve. There is mild thickening of the aortic valve. Aortic valve regurgitation is not visualized. Mild aortic valve sclerosis is present, with no evidence of aortic valve stenosis. FINDINGS  Left Ventricle: Left ventricular ejection fraction, by estimation, is 60 to 65%. The left ventricle has normal function. The left ventricle has no regional wall motion abnormalities. The left ventricular internal cavity size was normal in size. There is  mild left ventricular hypertrophy. Indeterminate diastolic filling due to E-A fusion. Right Ventricle: The right ventricular size is normal. Mildly increased right ventricular wall thickness. Right ventricular systolic function is normal. Tricuspid regurgitation signal is inadequate for assessing PA pressure. Left Atrium: Left atrial size was normal in size. Right Atrium: Right atrial size was mildly dilated. Pericardium: The pericardium was not well visualized. Mitral Valve: The mitral valve is abnormal. There is mild thickening of the mitral valve leaflet(s). No evidence of mitral valve regurgitation. No evidence of mitral valve stenosis. MV peak gradient, 8.4 mmHg. The mean mitral valve gradient is  4.0 mmHg. Tricuspid Valve: The tricuspid valve is not well visualized. Tricuspid valve regurgitation is trivial. Aortic Valve: The aortic valve has an indeterminant number of cusps. There is mild calcification of the aortic valve. There is mild thickening of the aortic valve. Aortic valve regurgitation is not visualized. Mild aortic valve sclerosis is present, with  no evidence of aortic valve stenosis. Aortic valve mean gradient measures 4.0 mmHg. Aortic valve peak gradient measures 7.3 mmHg. Aortic valve area, by VTI measures 2.06 cm. Pulmonic Valve: The pulmonic valve was not well visualized. Pulmonic valve regurgitation is not visualized. No evidence of pulmonic stenosis. Aorta: The aortic root is normal in size and structure. Pulmonary Artery: The pulmonary artery is not well seen. Venous: The inferior vena cava was not well visualized. IAS/Shunts: The interatrial septum was not well visualized.  LEFT VENTRICLE PLAX 2D LVIDd:         3.59 cm   Diastology LVIDs:         2.57 cm   LV e' medial:    7.51 cm/s LV PW:         1.26 cm   LV E/e' medial:  10.1 LV IVS:        0.98 cm   LV e' lateral:   11.20 cm/s LVOT diam:     2.00 cm   LV E/e' lateral: 6.8 LV SV:         43 LV SV Index:   20 LVOT Area:     3.14 cm  RIGHT VENTRICLE RV Basal diam:  3.72 cm RV S prime:     14.90 cm/s TAPSE (M-mode): 2.1 cm LEFT ATRIUM             Index        RIGHT ATRIUM           Index LA diam:        2.90 cm 1.32 cm/m   RA Area:     19.70 cm LA Vol (A2C):   76.2 ml 34.62 ml/m  RA Volume:   49.70 ml  22.58 ml/m LA Vol (A4C):   25.6 ml 11.63 ml/m LA Biplane Vol: 46.3 ml 21.04 ml/m  AORTIC VALVE                    PULMONIC VALVE AV Area (Vmax):    2.05 cm     RVOT Peak grad: 7 mmHg AV Area (Vmean):   1.99 cm AV Area (VTI):     2.06 cm AV Vmax:           135.00 cm/s AV Vmean:          91.100 cm/s AV VTI:            0.209 m AV Peak Grad:      7.3 mmHg AV Mean Grad:      4.0 mmHg LVOT Vmax:         88.10 cm/s LVOT Vmean:         57.800 cm/s LVOT VTI:          0.137 m LVOT/AV VTI ratio: 0.66  AORTA Ao Root diam: 2.40 cm MITRAL VALVE MV Area (PHT): 4.93 cm     SHUNTS MV Area VTI:   1.85 cm     Systemic VTI:  0.14 m MV Peak grad:  8.4 mmHg     Systemic Diam: 2.00 cm MV Mean grad:  4.0 mmHg     Pulmonic VTI:  0.204 m MV Vmax:       1.45 m/s MV Vmean:      94.0 cm/s MV Decel Time: 154 msec MV E velocity: 75.80 cm/s MV A velocity: 120.00 cm/s MV E/A ratio:  0.63 Harrell Gave End MD Electronically signed by Nelva Bush MD Signature Date/Time: 08/26/2021/12:23:57 PM    Final     Disposition: Discharge disposition: 01-Home or Self Care         Follow-up Information     Duanne Guess, PA-C. Go on 09/08/2021.   Specialties: Orthopedic Surgery, Emergency Medicine Why: Appt @ 9:45 am Contact information: [place] [address] [phone]                  Signed: Feliberto Gottron 08/26/2021, 4:44 PM

## 2021-08-24 NOTE — Progress Notes (Signed)
Subjective: 2 Days Post-Op Procedure(s) (LRB): TOTAL KNEE ARTHROPLASTY (Right) Patient reports pain as moderate.   Patient is well, and has had no acute complaints or problems Denies any CP, SOB, ABD pain. We will continue therapy today.  Slow progress with PT yesterday Plan is to go Home after hospital stay.  Objective: Vital signs in last 24 hours: Temp:  [98 F (36.7 C)-98.8 F (37.1 C)] 98 F (36.7 C) (11/06 0429) Pulse Rate:  [63-110] 63 (11/06 0429) Resp:  [19-20] 20 (11/06 0429) BP: (112-153)/(73-85) 112/78 (11/06 0429) SpO2:  [79 %-96 %] 96 % (11/06 0650)  Intake/Output from previous day: 11/05 0701 - 11/06 0700 In: 960 [P.O.:960] Out: 2250 [Urine:2250] Intake/Output this shift: No intake/output data recorded.  Recent Labs    08/22/21 1644 08/23/21 0403  HGB 11.5* 10.7*   Recent Labs    08/22/21 1644 08/23/21 0403  WBC 12.9* 12.2*  RBC 4.36 4.00*  HCT 36.6* 34.4*  PLT 218 215   Recent Labs    08/22/21 1644 08/23/21 0403  NA  --  137  K  --  4.5  CL  --  103  CO2  --  26  BUN  --  13  CREATININE 1.07 0.99  GLUCOSE  --  168*  CALCIUM  --  8.5*   No results for input(s): LABPT, INR in the last 72 hours.  EXAM General - Patient is Alert, Appropriate, and Oriented Extremity - Neurovascular intact Sensation intact distally Intact pulses distally Dorsiflexion/Plantar flexion intact No cellulitis present Compartment soft Dressing - dressing C/D/I and no drainage, Praveena intact without drainage Motor Function - intact, moving foot and toes well on exam.   Past Medical History:  Diagnosis Date   Allergy    Aortic atherosclerosis (HCC)    Asthma    C. difficile diarrhea    Chronic pain    Collagenous colitis    Coronary artery disease    a.) PCI with 2.75 x 18 mm Resolute Onyx DES x 1 to prox/mid LAD on 09/05/2019   DDD (degenerative disc disease), cervical    DDD (degenerative disc disease), lumbar    GERD (gastroesophageal reflux  disease)    Grade I diastolic dysfunction    Hepatic steatosis    Hyperlipidemia    Hypertension    Liver cancer ([place]) 03/2015   Migraines    Myocardial infarction (HCC)     OSA on CPAP    Seizures (HCC)    several as child when sick.  None since age 48   Stroke [facility])    'mini-stroke" 30 yrs ago. no deficits.   T2DM (type 2 diabetes mellitus) ([place])    Wears dentures    full upper and lower    Assessment/Plan:   2 Days Post-Op Procedure(s) (LRB): TOTAL KNEE ARTHROPLASTY (Right) Active Problems:   S/P TKR (total knee replacement) using cement, right  Estimated body mass index is 36.2 kg/m as calculated from the following:   Height as of this encounter: 5\' 8"  (1.727 m).   Weight as of this encounter: 108 kg. Advance diet Up with therapy Work on bowel movement Pain moderate.  Continue with current pain regimen.  Continue to monitor vital signs.  Currently vital signs are stable Care management to assist with discharge to home with home health PT, most likely on Monday  DVT Prophylaxis - Lovenox, Foot Pumps, and TED hose Weight-Bearing as tolerated to right leg   T. Rachelle Hora, PA-C [place] 08/24/2021, 7:40  AM

## 2021-08-25 ENCOUNTER — Inpatient Hospital Stay: Payer: Medicare Other

## 2021-08-25 ENCOUNTER — Encounter: Payer: Self-pay | Admitting: Orthopedic Surgery

## 2021-08-25 DIAGNOSIS — M1711 Unilateral primary osteoarthritis, right knee: Secondary | ICD-10-CM | POA: Diagnosis not present

## 2021-08-25 DIAGNOSIS — Z7902 Long term (current) use of antithrombotics/antiplatelets: Secondary | ICD-10-CM | POA: Diagnosis not present

## 2021-08-25 DIAGNOSIS — I5032 Chronic diastolic (congestive) heart failure: Secondary | ICD-10-CM | POA: Diagnosis present

## 2021-08-25 DIAGNOSIS — Z96651 Presence of right artificial knee joint: Secondary | ICD-10-CM

## 2021-08-25 DIAGNOSIS — Z794 Long term (current) use of insulin: Secondary | ICD-10-CM | POA: Diagnosis not present

## 2021-08-25 DIAGNOSIS — I4729 Other ventricular tachycardia: Secondary | ICD-10-CM | POA: Diagnosis not present

## 2021-08-25 DIAGNOSIS — I2510 Atherosclerotic heart disease of native coronary artery without angina pectoris: Secondary | ICD-10-CM | POA: Diagnosis present

## 2021-08-25 DIAGNOSIS — E785 Hyperlipidemia, unspecified: Secondary | ICD-10-CM | POA: Diagnosis present

## 2021-08-25 DIAGNOSIS — K219 Gastro-esophageal reflux disease without esophagitis: Secondary | ICD-10-CM | POA: Diagnosis present

## 2021-08-25 DIAGNOSIS — R079 Chest pain, unspecified: Secondary | ICD-10-CM | POA: Diagnosis not present

## 2021-08-25 DIAGNOSIS — E1165 Type 2 diabetes mellitus with hyperglycemia: Secondary | ICD-10-CM | POA: Diagnosis present

## 2021-08-25 DIAGNOSIS — N400 Enlarged prostate without lower urinary tract symptoms: Secondary | ICD-10-CM | POA: Diagnosis present

## 2021-08-25 DIAGNOSIS — G8929 Other chronic pain: Secondary | ICD-10-CM | POA: Diagnosis present

## 2021-08-25 DIAGNOSIS — Z96641 Presence of right artificial hip joint: Secondary | ICD-10-CM | POA: Diagnosis present

## 2021-08-25 DIAGNOSIS — F32A Depression, unspecified: Secondary | ICD-10-CM | POA: Diagnosis present

## 2021-08-25 DIAGNOSIS — Z20822 Contact with and (suspected) exposure to covid-19: Secondary | ICD-10-CM | POA: Diagnosis present

## 2021-08-25 DIAGNOSIS — R569 Unspecified convulsions: Secondary | ICD-10-CM

## 2021-08-25 DIAGNOSIS — R000 Tachycardia, unspecified: Secondary | ICD-10-CM | POA: Diagnosis not present

## 2021-08-25 DIAGNOSIS — Z7982 Long term (current) use of aspirin: Secondary | ICD-10-CM | POA: Diagnosis not present

## 2021-08-25 DIAGNOSIS — I700 Atherosclerosis of aorta: Secondary | ICD-10-CM | POA: Diagnosis present

## 2021-08-25 DIAGNOSIS — I2699 Other pulmonary embolism without acute cor pulmonale: Secondary | ICD-10-CM | POA: Diagnosis not present

## 2021-08-25 DIAGNOSIS — M549 Dorsalgia, unspecified: Secondary | ICD-10-CM | POA: Diagnosis present

## 2021-08-25 DIAGNOSIS — I110 Hypertensive heart disease with heart failure: Secondary | ICD-10-CM | POA: Diagnosis present

## 2021-08-25 DIAGNOSIS — Z01810 Encounter for preprocedural cardiovascular examination: Secondary | ICD-10-CM | POA: Diagnosis not present

## 2021-08-25 DIAGNOSIS — Z981 Arthrodesis status: Secondary | ICD-10-CM | POA: Diagnosis not present

## 2021-08-25 DIAGNOSIS — Z86010 Personal history of colonic polyps: Secondary | ICD-10-CM | POA: Diagnosis not present

## 2021-08-25 DIAGNOSIS — J9811 Atelectasis: Secondary | ICD-10-CM | POA: Diagnosis not present

## 2021-08-25 DIAGNOSIS — J45909 Unspecified asthma, uncomplicated: Secondary | ICD-10-CM | POA: Diagnosis present

## 2021-08-25 DIAGNOSIS — I152 Hypertension secondary to endocrine disorders: Secondary | ICD-10-CM | POA: Diagnosis present

## 2021-08-25 DIAGNOSIS — I2694 Multiple subsegmental pulmonary emboli without acute cor pulmonale: Secondary | ICD-10-CM | POA: Diagnosis not present

## 2021-08-25 DIAGNOSIS — R0602 Shortness of breath: Secondary | ICD-10-CM | POA: Diagnosis not present

## 2021-08-25 DIAGNOSIS — Z79899 Other long term (current) drug therapy: Secondary | ICD-10-CM | POA: Diagnosis not present

## 2021-08-25 DIAGNOSIS — G4733 Obstructive sleep apnea (adult) (pediatric): Secondary | ICD-10-CM | POA: Diagnosis present

## 2021-08-25 DIAGNOSIS — J439 Emphysema, unspecified: Secondary | ICD-10-CM | POA: Diagnosis present

## 2021-08-25 DIAGNOSIS — I4891 Unspecified atrial fibrillation: Secondary | ICD-10-CM | POA: Diagnosis not present

## 2021-08-25 LAB — COMPREHENSIVE METABOLIC PANEL
ALT: 19 U/L (ref 0–44)
AST: 29 U/L (ref 15–41)
Albumin: 3.4 g/dL — ABNORMAL LOW (ref 3.5–5.0)
Alkaline Phosphatase: 51 U/L (ref 38–126)
Anion gap: 12 (ref 5–15)
BUN: 19 mg/dL (ref 8–23)
CO2: 22 mmol/L (ref 22–32)
Calcium: 8.9 mg/dL (ref 8.9–10.3)
Chloride: 99 mmol/L (ref 98–111)
Creatinine, Ser: 1.09 mg/dL (ref 0.61–1.24)
GFR, Estimated: >60 mL/min (ref >60)
Glucose, Bld: 186 mg/dL — ABNORMAL HIGH (ref 70–99)
Potassium: 4.2 mmol/L (ref 3.5–5.1)
Sodium: 133 mmol/L — ABNORMAL LOW (ref 135–145)
Total Bilirubin: 1.2 mg/dL (ref 0.3–1.2)
Total Protein: 7 g/dL (ref 6.5–8.1)

## 2021-08-25 LAB — BRAIN NATRIURETIC PEPTIDE: B Natriuretic Peptide: 48.6 pg/mL (ref 0.0–100.0)

## 2021-08-25 LAB — CBC
HCT: 28.5 % — ABNORMAL LOW (ref 39.0–52.0)
Hemoglobin: 8.9 g/dL — ABNORMAL LOW (ref 13.0–17.0)
MCH: 26.2 pg (ref 26.0–34.0)
MCHC: 31.2 g/dL (ref 30.0–36.0)
MCV: 83.8 fL (ref 80.0–100.0)
Platelets: 218 10*3/uL (ref 150–400)
RBC: 3.4 MIL/uL — ABNORMAL LOW (ref 4.22–5.81)
RDW: 14.6 % (ref 11.5–15.5)
WBC: 11.4 10*3/uL — ABNORMAL HIGH (ref 4.0–10.5)
nRBC: 0 % (ref 0.0–0.2)

## 2021-08-25 LAB — GLUCOSE, CAPILLARY
Glucose-Capillary: 189 mg/dL — ABNORMAL HIGH (ref 70–99)
Glucose-Capillary: 195 mg/dL — ABNORMAL HIGH (ref 70–99)
Glucose-Capillary: 198 mg/dL — ABNORMAL HIGH (ref 70–99)
Glucose-Capillary: 195 mg/dL — ABNORMAL HIGH (ref 70–99)

## 2021-08-25 LAB — TROPONIN I (HIGH SENSITIVITY)
Troponin I (High Sensitivity): 7 ng/L (ref <18)
Troponin I (High Sensitivity): 6 ng/L (ref <18)

## 2021-08-25 MED ORDER — DM-GUAIFENESIN ER 30-600 MG PO TB12
1.0000 | ORAL_TABLET | Freq: Two times a day (BID) | ORAL | Status: DC | PRN
  Filled 2021-08-25: qty 1

## 2021-08-25 MED ORDER — SODIUM CHLORIDE 0.9 % IV BOLUS
500.0000 mL | Freq: Once | INTRAVENOUS | Status: AC
  Administered 2021-08-25: 500 mL via INTRAVENOUS

## 2021-08-25 MED ORDER — ACETAMINOPHEN 325 MG PO TABS: 650.0000 mg | ORAL_TABLET | Freq: Four times a day (QID) | ORAL | Status: DC | PRN

## 2021-08-25 MED ORDER — INSULIN ASPART 100 UNIT/ML IJ SOLN: 0.0000 [IU] | Freq: Every day | INTRAMUSCULAR | Status: DC

## 2021-08-25 MED ORDER — LORAZEPAM 2 MG/ML IJ SOLN: 1.0000 mg | INTRAMUSCULAR | Status: DC | PRN

## 2021-08-25 MED ORDER — HYDRALAZINE HCL 20 MG/ML IJ SOLN: 5.0000 mg | INTRAMUSCULAR | Status: DC | PRN

## 2021-08-25 MED ORDER — IOHEXOL 350 MG/ML SOLN
75.0000 mL | Freq: Once | INTRAVENOUS | Status: AC | PRN
  Administered 2021-08-25: 75 mL via INTRAVENOUS

## 2021-08-25 MED ORDER — INSULIN ASPART 100 UNIT/ML IJ SOLN
0.0000 [IU] | Freq: Three times a day (TID) | INTRAMUSCULAR | Status: DC
  Administered 2021-08-25: 2 [IU] via SUBCUTANEOUS
  Administered 2021-08-26: 3 [IU] via SUBCUTANEOUS
  Administered 2021-08-26: 2 [IU] via SUBCUTANEOUS
  Filled 2021-08-25 (×3): qty 1

## 2021-08-25 MED ORDER — ALBUTEROL SULFATE (2.5 MG/3ML) 0.083% IN NEBU: 3.0000 mL | INHALATION_SOLUTION | RESPIRATORY_TRACT | Status: DC | PRN

## 2021-08-25 MED ORDER — NITROGLYCERIN 0.4 MG SL SUBL
0.4000 mg | SUBLINGUAL_TABLET | SUBLINGUAL | Status: DC | PRN
  Administered 2021-08-25: 0.4 mg via SUBLINGUAL

## 2021-08-25 NOTE — Progress Notes (Signed)
   08/25/21 1500  Clinical Encounter Type  Visited With Patient  Visit Type Initial  Referral From Nurse  Consult/Referral To Chaplain  Spiritual Encounters  Spiritual Needs Emotional;Prayer  Chaplain Carena Stream responded to a RR for room 156A pt, Edward Schneider. Provided a ministry of presence and spiritual support.
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
## 2021-08-25 NOTE — Progress Notes (Addendum)
Subjective: 3 Days Post-Op Procedure(s) (LRB): TOTAL KNEE ARTHROPLASTY (Right) Patient reports pain as mild.   Patient is well, and has had no acute complaints or problems.  Heart rate noted to be slightly elevated this morning. Denies any CP, SOB, ABD pain.  No dizziness or lightheadedness. We will continue therapy today.  Slow progress with PT yesterday Plan is to go Home after hospital stay.  Objective: Vital signs in last 24 hours: Temp:  [98.3 F (36.8 C)-99.3 F (37.4 C)] 98.3 F (36.8 C) (11/07 0751) Pulse Rate:  [108-112] 112 (11/07 0751) Resp:  [14-20] 17 (11/07 0751) BP: (105-141)/(64-90) 139/74 (11/07 0751) SpO2:  [93 %-98 %] 96 % (11/07 0751)  Intake/Output from previous day: 11/06 0701 - 11/07 0700 In: 240 [P.O.:240] Out: 4100 [Urine:4100] Intake/Output this shift: No intake/output data recorded.  Recent Labs    08/22/21 1644 08/23/21 0403  HGB 11.5* 10.7*   Recent Labs    08/22/21 1644 08/23/21 0403  WBC 12.9* 12.2*  RBC 4.36 4.00*  HCT 36.6* 34.4*  PLT 218 215   Recent Labs    08/22/21 1644 08/23/21 0403  NA  --  137  K  --  4.5  CL  --  103  CO2  --  26  BUN  --  13  CREATININE 1.07 0.99  GLUCOSE  --  168*  CALCIUM  --  8.5*   No results for input(s): LABPT, INR in the last 72 hours.  EXAM General - Patient is Alert, Appropriate, and Oriented Extremity - Neurovascular intact Sensation intact distally Intact pulses distally Dorsiflexion/Plantar flexion intact No cellulitis present Compartment soft Dressing - dressing C/D/I and no drainage, Praveena intact without drainage Motor Function - intact, moving foot and toes well on exam.   Past Medical History:  Diagnosis Date   Allergy    Aortic atherosclerosis (HCC)    Asthma    C. difficile diarrhea    Chronic pain    Collagenous colitis    Coronary artery disease    a.) PCI with 2.75 x 18 mm Resolute Onyx DES x 1 to prox/mid LAD on 09/05/2019   DDD (degenerative disc  disease), cervical    DDD (degenerative disc disease), lumbar    GERD (gastroesophageal reflux disease)    Grade I diastolic dysfunction    Hepatic steatosis    Hyperlipidemia    Hypertension    Liver cancer ([place]) 03/2015   Migraines    Myocardial infarction (HCC)     OSA on CPAP    Seizures (HCC)    several as child when sick.  None since age 59   Stroke [facility])    'mini-stroke" 30 yrs ago. no deficits.   T2DM (type 2 diabetes mellitus) ([place])    Wears dentures    full upper and lower    Assessment/Plan:   3 Days Post-Op Procedure(s) (LRB): TOTAL KNEE ARTHROPLASTY (Right) Active Problems:   S/P TKR (total knee replacement) using cement, right  Estimated body mass index is 36.2 kg/m as calculated from the following:   Height as of this encounter: 5\' 8"  (1.727 m).   Weight as of this encounter: 108 kg. Advance diet Up with therapy Pain well controlled.  Continue with current pain regimen.  Continue to monitor vital signs.  Will give bolus of IV fluids this morning. Care management to assist with discharge to home with home health PT today pending completion of PT goals  DVT Prophylaxis - Lovenox, Foot Pumps, and TED hose  Weight-Bearing as tolerated to right leg   T. Rachelle Hora, PA-C [place] 08/25/2021, 8:06 AM

## 2021-08-25 NOTE — Progress Notes (Signed)
   08/25/21 0851  Assess: MEWS Score  Pulse Rate (!) 115  Assess: MEWS Score  MEWS Temp 0  MEWS Systolic 0  MEWS Pulse 2  MEWS RR 0  MEWS LOC 0  MEWS Score 2  MEWS Score Color Yellow  Assess: if the MEWS score is Yellow or Red  Were vital signs taken at a resting state? Yes  Focused Assessment Change from prior assessment (see assessment flowsheet)  Does the patient meet 2 or more of the SIRS criteria? No  MEWS guidelines implemented *See Row Information* Yes  Treat  MEWS Interventions Other (Comment) (500 ml bolus)  Notify: Charge Nurse/RN  Name of Charge Nurse/RN Notified Teresa, RN  Date Charge Nurse/RN Notified 08/25/21  Time Charge Nurse/RN Notified 4290  Notify: Provider  Provider Name/Title Rachelle Hora, PA  Date Provider Notified 08/25/21  Time Provider Notified [phone]  Notification Type Face-to-face  Notification Reason Other (Comment) (HR)  Provider response See new orders  Date of Provider Response 08/25/21  Time of Provider Response 0851  Assess: SIRS CRITERIA  SIRS Temperature  0  SIRS Pulse 1  SIRS Respirations  0  SIRS WBC 0  SIRS Score Sum  1
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
## 2021-08-25 NOTE — Consult Note (Addendum)
Medical Consultation   Edward Schneider.  WLN:[id number]  DOB: 05-23-1956  DOA: 08/22/2021  PCP: [name], NP   Outpatient Specialists:    Requesting physician: -PA, Rachelle Hora of ortho  Reason for consultation: -chest pain and tachycardia  History of Present Illness: Edward Schneider. is an 65 y.o. male with PMH of hypertension, hyperlipidemia, diabetes mellitus, asthma, stroke, GERD, depression, seizure not medications (last seizure was 2 years ago), OSA on CPAP, CAD, myocardial infarction, stent placement, dCHF, CAD,  who was admitted by orthopedic surgery team and underwent right knee replacement 3 days ago.  We are asked to consult due to chest pain and tachycardia..  Pt is 3 Days Post-Op Procedure of right knee replacement. Pt has mild pain in surgical site.  No fever or chills.  Patient states that he developed chest pain today, which is located in substernal area, initially severe, currently chest pain-free, sharp, nonradiating.  Associated with mild shortness of breath. His chest pain is not pleuritic, not aggravated by deep breath.  Patient has mild dry cough. Denies nausea, vomiting, diarrhea or abdominal pain.  No symptoms of UTI.  Patient was also found to have tachycardia with heart rate 100-110s.   Lab and image: WBC 11.4, had a negative COVID PCR 08/20/2021, electrolytes renal function okay, troponin level 7, BNP 48, temperature normal, blood pressure 142/77, heart rate 117, RR 16, oxygen saturation 99% on room air.  Pending CT angiogram of chest.  EKG: Reviewed independently, sinus rhythm, QTC 444, early R wave progression, ST depression in V3-V6, tachycardia with heart rate 113.   Review of Systems:   General: no fevers, chills, no changes in body weight, no changes in appetite Skin: no rash HEENT: no blurry vision, hearing changes or sore throat Pulm: has dyspnea, coughing, no wheezing CV: has chest pain, no palpitations, shortness of  breath Abd: no nausea/vomiting, abdominal pain, diarrhea/constipation GU: no dysuria, hematuria, polyuria Ext: has mild pain in surgical site of right knee.  Has mild bilateral leg edema Neuro: no weakness, numbness, or tingling    Past Medical History: Past Medical History:  Diagnosis Date   Allergy    Aortic atherosclerosis (HCC)    Asthma    C. difficile diarrhea    Chronic pain    Collagenous colitis    Coronary artery disease    a.) PCI with 2.75 x 18 mm Resolute Onyx DES x 1 to prox/mid LAD on 09/05/2019   DDD (degenerative disc disease), cervical    DDD (degenerative disc disease), lumbar    GERD (gastroesophageal reflux disease)    Grade I diastolic dysfunction    Hepatic steatosis    Hyperlipidemia    Hypertension    Liver cancer ([place]) 03/2015   Migraines    Myocardial infarction (HCC)     OSA on CPAP    Seizures ([place])    several as child when sick.  None since age 47   Stroke [facility])    'mini-stroke" 30 yrs ago. no deficits.   T2DM (type 2 diabetes mellitus) ([place])    Wears dentures    full upper and lower    Past Surgical History: Past Surgical History:  Procedure Laterality Date   APPENDECTOMY     BACK SURGERY     CARDIAC CATHETERIZATION     No stent placed in his "76's"   CERVICAL FUSION     COLONOSCOPY  WITH PROPOFOL N/A 03/06/2016   Procedure: COLONOSCOPY WITH PROPOFOL;  Surgeon: Lucilla Lame, MD;  Location: [place];  Service: Endoscopy;  Laterality: N/A;  requests early   COLONOSCOPY WITH PROPOFOL N/A 06/18/2020   Procedure: COLONOSCOPY WITH PROPOFOL;  Surgeon: Lucilla Lame, MD;  Location: [facility] ENDOSCOPY;  Service: Endoscopy;  Laterality: N/A;   ESOPHAGOGASTRODUODENOSCOPY (EGD) WITH PROPOFOL N/A 09/20/2017   Procedure: ESOPHAGOGASTRODUODENOSCOPY (EGD) WITH PROPOFOL;  Surgeon: Lucilla Lame, MD;  Location: [place];  Service: Endoscopy;  Laterality: N/A;  Diabetic - oral meds   FINGER SURGERY Left    INTRAVASCULAR PRESSURE WIRE/FFR  STUDY N/A 09/05/2019   Procedure: INTRAVASCULAR PRESSURE WIRE/FFR STUDY;  Surgeon: Nelva Bush, MD;  Location: [place] CV LAB;  Service: Cardiovascular;  Laterality: N/A;   KNEE SURGERY Right    LEFT HEART CATH AND CORONARY ANGIOGRAPHY Left 09/05/2019   Procedure: LEFT HEART CATH AND CORONARY ANGIOGRAPHY (2.75 x 18 mm Resolute Onyx DES x 1 to prox/mid LAD);  Surgeon: Nelva Bush, MD;  Location: [place] [facility];  Service: Cardiovascular;  Laterality: Left;   NECK SURGERY     spleen surgery     TOE SURGERY Right    TOTAL HIP ARTHROPLASTY Right 06/03/2021   Procedure: TOTAL HIP ARTHROPLASTY ANTERIOR APPROACH;  Surgeon: Hessie Knows, MD;  Location: [facility];  Service: Orthopedics;  Laterality: Right;   TOTAL KNEE ARTHROPLASTY Right 08/22/2021   Procedure: TOTAL KNEE ARTHROPLASTY;  Surgeon: Hessie Knows, MD;  Location: [facility];  Service: Orthopedics;  Laterality: Right;     Allergies:   Allergies  Allergen Reactions   Bee Venom Anaphylaxis   Crestor [Rosuvastatin Calcium] Shortness Of Breath and Swelling   Fentanyl Itching and Hives    blisters Patch   Gabapentin Diarrhea    Severe diarrhea which caused incontinence, loss of appetite and weight loss.   Shellfish Allergy Anaphylaxis and Swelling    Shrimp causes throat to swell and tingling in tongue.    Furosemide Nausea And Vomiting   Buprenorphine Hcl Itching   Chlorhexidine Gluconate Itching and Rash   Simvastatin Diarrhea     Social History:  reports that he quit smoking about 11 years ago. His smoking use included cigarettes. He has a 100.00 pack-year smoking history. He has never used smokeless tobacco. He reports that he does not drink alcohol and does not use drugs.   Family History: Family History  Problem Relation Age of Onset   Arthritis Mother    Diabetes Mother    Kidney disease Mother    Heart disease Mother    Hypertension Mother    Arthritis Father    Hearing loss Father     Hypertension Father    Heart disease Father    Diabetes Sister    Heart disease Sister    Diabetes Daughter    Diabetes Maternal Aunt    Diabetes Maternal Grandmother    Heart Problems Brother    Heart Problems Brother    Heart Problems Brother    Heart attack Maternal Grandfather    Colon cancer Paternal Grandfather    Physical Exam:   Physical Exam:   General: Not in acute distress HEENT: PERRL, EOMI, no scleral icterus, No JVD or bruit Cardiac: S1/S2, RRR, No murmurs, gallops or rubs Pulm: No rales, wheezing, rhonchi or rubs. Abd: Soft, nondistended, nontender, no rebound pain, no organomegaly, BS present Ext: has trace leg edema bilaterally, 1+DP/PT pulse bilaterally Musculoskeletal: s/p of right knee replacement, has mild tenderness in her surgical  site Skin: No rashes.  Neuro: Alert and oriented X3, cranial nerves II-XII grossly intact,  Psych: Patient is not psychotic, no suicidal or hemocidal ideation.       Data reviewed:  I have personally reviewed following labs and imaging studies Labs:  CBC: Recent Labs  Lab 08/22/21 1644 08/23/21 0403 08/25/21 1217  WBC 12.9* 12.2* 11.4*  HGB 11.5* 10.7* 8.9*  HCT 36.6* 34.4* 28.5*  MCV 83.9 86.0 83.8  PLT 218 215 202    Basic Metabolic Panel: Recent Labs  Lab 08/22/21 1644 08/23/21 0403 08/25/21 1217  NA  --  137 133*  K  --  4.5 4.2  CL  --  103 99  CO2  --  26 22  GLUCOSE  --  168* 186*  BUN  --  13 19  CREATININE 1.07 0.99 1.09  CALCIUM  --  8.5* 8.9   GFR Estimated Creatinine Clearance: 81.5 mL/min (by C-G formula based on SCr of 1.09 mg/dL). Liver Function Tests: Recent Labs  Lab 08/25/21 1217  AST 29  ALT 19  ALKPHOS 51  BILITOT 1.2  PROT 7.0  ALBUMIN 3.4*   No results for input(s): LIPASE, AMYLASE in the last 168 hours. No results for input(s): AMMONIA in the last 168 hours. Coagulation profile No results for input(s): INR, PROTIME in the last 168 hours.  Cardiac Enzymes: No  results for input(s): CKTOTAL, CKMB, CKMBINDEX, TROPONINI in the last 168 hours. BNP: Invalid input(s): POCBNP CBG: Recent Labs  Lab 08/24/21 1149 08/24/21 1627 08/24/21 2015 08/25/21 0746 08/25/21 1202  GLUCAP 216* 152* 170* 189* 195*   D-Dimer No results for input(s): DDIMER in the last 72 hours. Hgb A1c No results for input(s): HGBA1C in the last 72 hours. Lipid Profile No results for input(s): CHOL, HDL, LDLCALC, TRIG, CHOLHDL, LDLDIRECT in the last 72 hours. Thyroid function studies No results for input(s): TSH, T4TOTAL, T3FREE, THYROIDAB in the last 72 hours.  Invalid input(s): FREET3 Anemia work up No results for input(s): VITAMINB12, FOLATE, FERRITIN, TIBC, IRON, RETICCTPCT in the last 72 hours. Urinalysis    Component Value Date/Time   COLORURINE YELLOW (A) 08/13/2021 1254   APPEARANCEUR CLEAR (A) 08/13/2021 1254   APPEARANCEUR Clear 04/27/2018 1548   LABSPEC 1.024 08/13/2021 1254   LABSPEC 1.013 02/28/2014 1844   PHURINE 5.0 08/13/2021 1254   GLUCOSEU NEGATIVE 08/13/2021 1254   GLUCOSEU Negative 02/28/2014 1844   HGBUR NEGATIVE 08/13/2021 1254   BILIRUBINUR NEGATIVE 08/13/2021 1254   BILIRUBINUR Negative 04/27/2018 1548   BILIRUBINUR Negative 02/28/2014 1844   KETONESUR NEGATIVE 08/13/2021 1254   PROTEINUR NEGATIVE 08/13/2021 1254   NITRITE NEGATIVE 08/13/2021 1254   LEUKOCYTESUR NEGATIVE 08/13/2021 1254   LEUKOCYTESUR Negative 02/28/2014 1844     Microbiology Recent Results (from the past 240 hour(s))  SARS CORONAVIRUS 2 (TAT 6-24 HRS) Nasopharyngeal Nasopharyngeal Swab     Status: None   Collection Time: 08/20/21  9:00 AM   Specimen: Nasopharyngeal Swab  Result Value Ref Range Status   SARS Coronavirus 2 NEGATIVE NEGATIVE Final    Comment: (NOTE) SARS-CoV-2 target nucleic acids are NOT DETECTED.  The SARS-CoV-2 RNA is generally detectable in upper and lower respiratory specimens during the acute phase of infection. Negative results do not  preclude SARS-CoV-2 infection, do not rule out co-infections with other pathogens, and should not be used as the sole basis for treatment or other patient management decisions. Negative results must be combined with clinical observations, patient history, and epidemiological information. The expected result  is Negative.  Fact Sheet for Patients: SugarRoll.be  Fact Sheet for Healthcare Providers: https://www.woods-mathews.com/  This test is not yet approved or cleared by the [place] FDA and  has been authorized for detection and/or diagnosis of SARS-CoV-2 by FDA under an Emergency Use Authorization (EUA). This EUA will remain  in effect (meaning this test can be used) for the duration of the COVID-19 declaration under Se ction 564(b)(1) of the Act, 21 U.S.C. section 360bbb-3(b)(1), unless the authorization is terminated or revoked sooner.  Performed at [facility], [place] [address] [place], [place] [address]        Inpatient Medications:   Scheduled Meds:  arformoterol  15 mcg Nebulization BID   And   umeclidinium bromide  1 puff Inhalation Daily   vitamin C  1,000 mg Oral Daily   aspirin EC  81 mg Oral Daily   atorvastatin  80 mg Oral QHS   benazepril  10 mg Oral Daily   docusate sodium  100 mg Oral BID   doxepin  50 mg Oral QHS   DULoxetine  30 mg Oral BID WC   DULoxetine  60 mg Oral QHS   enoxaparin (LOVENOX) injection  30 mg Subcutaneous Q12H   ezetimibe  10 mg Oral Daily   insulin aspart  0-5 Units Subcutaneous QHS   insulin aspart  0-9 Units Subcutaneous TID WC   insulin glargine-yfgn  32 Units Subcutaneous QHS   isosorbide mononitrate  60 mg Oral Daily   loratadine  10 mg Oral Daily   pantoprazole  40 mg Oral BID   pregabalin  50 mg Oral TID   senna-docusate  1 tablet Oral QHS   sucralfate  1 g Oral TID WC & HS   traMADol  50 mg Oral Q6H   traZODone  100 mg Oral QHS   Continuous Infusions:  sodium  chloride Stopped (08/24/21 0731)   methocarbamol (ROBAXIN) IV       Radiological Exams on Admission: No results found.  Impression/Recommendations Principal Problem:   S/P TKR (total knee replacement) using cement, right Active Problems:   Emphysema lung (HCC)   Hyperlipidemia associated with type 2 diabetes mellitus (HCC)   Hypertension associated with diabetes (HCC)   GERD (gastroesophageal reflux disease)   Sleep apnea   Uncontrolled type 2 diabetes mellitus with hyperglycemia (HCC)   Depression   Chest pain   Asthma   CAD (coronary artery disease)   Chronic diastolic CHF (congestive heart failure) (HCC)   Seizure (HCC)    S/P TKR (total knee replacement) using cement, right: -pain management per primary orthopedic team -Patient is on Lovenox DVT prophylaxis  Chest pain and hx of CAD: s/p of Stent.  Urology is not clear.  Initial troponin negative.  She also has tachycardia.  Potential differential diagnosis is PE.  - Trend Trop - prn Nitroglycerin and oxycodone - Aspirin, lipitor, Zetia, Imdur - Risk factor stratification: will check FLP and A1C  -f/u CAT to r/o PE --> negative for PE  Emphysema lung (HCC) and asthma: No wheezing or rhonchi on auscultation -Bronchodilators -As needed Mucinex  Hyperlipidemia associated with type 2 diabetes mellitus (HCC) -Lipitor and Zetia  Hypertension associated with diabetes ([place]) -IV hydralazine as needed -Lotensin, Imdur  GERD (gastroesophageal reflux disease) -Protonix, Carafate  Sleep apnea -CPAP  Uncontrolled type 2 diabetes mellitus with hyperglycemia ([place]): Recent A1c 7.6, poorly controlled.  Patient still taking Humalog, farxiga, Dulaglutide -on glargine insulin 32 units daily -Sliding scale insulin  Depression -  Continue home medications  Chronic diastolic CHF (congestive heart failure) ([place]): 2D echo on 12/14/2019 showed EF 60 to 65% with grade 1 diastolic dysfunction.  Patient has trace leg edema, but no  JVD.  No oxygen desaturation.  BNP normal for age.  CHF seem to be compensated. -Watch volume status closely  Seizure [facility]): Patient not taking medications currently.  Last seizure was 2 years ago. -Seizure precaution -As needed Ativan for seizure   Thank you for this consultation.  Our [facility] hospitalist team will follow the patient with you.   Time Spent: 40 min  Ivor Costa M.D. [facility] 08/25/2021, 3:13 PM

## 2021-08-26 ENCOUNTER — Other Ambulatory Visit: Payer: Medicare Other

## 2021-08-26 ENCOUNTER — Inpatient Hospital Stay (HOSPITAL_COMMUNITY)
Admission: AD | Admit: 2021-08-26 | Discharge: 2021-08-26 | Disposition: A | Discharge status: Home / Self Care | Payer: Medicare Other | Source: Ambulatory Visit | Attending: Student | Admitting: Student

## 2021-08-26 DIAGNOSIS — I4891 Unspecified atrial fibrillation: Secondary | ICD-10-CM

## 2021-08-26 DIAGNOSIS — I4729 Other ventricular tachycardia: Secondary | ICD-10-CM | POA: Diagnosis not present

## 2021-08-26 DIAGNOSIS — R079 Chest pain, unspecified: Secondary | ICD-10-CM

## 2021-08-26 LAB — HEMOGLOBIN A1C
Hgb A1c MFr Bld: 8.3 % — ABNORMAL HIGH (ref 4.8–5.6)
Mean Plasma Glucose: 191.51 mg/dL

## 2021-08-26 LAB — CBC
HCT: 31.4 % — ABNORMAL LOW (ref 39.0–52.0)
Hemoglobin: 10 g/dL — ABNORMAL LOW (ref 13.0–17.0)
MCH: 27 pg (ref 26.0–34.0)
MCHC: 31.8 g/dL (ref 30.0–36.0)
MCV: 84.6 fL (ref 80.0–100.0)
Platelets: 283 10*3/uL (ref 150–400)
RBC: 3.71 MIL/uL — ABNORMAL LOW (ref 4.22–5.81)
RDW: 14.7 % (ref 11.5–15.5)
WBC: 11 10*3/uL — ABNORMAL HIGH (ref 4.0–10.5)
nRBC: 0.2 % (ref 0.0–0.2)

## 2021-08-26 LAB — ECHOCARDIOGRAM COMPLETE
AR max vel: 2.05 cm2
AV Area VTI: 2.06 cm2
AV Area mean vel: 1.99 cm2
AV Mean grad: 4 mmHg
AV Peak grad: 7.3 mmHg
Ao pk vel: 1.35 m/s
Area-P 1/2: 4.93 cm2
Height: 68 in
MV VTI: 1.85 cm2
S' Lateral: 2.57 cm
Weight: 3809.55 oz

## 2021-08-26 LAB — IRON AND TIBC
Iron: 42 ug/dL — ABNORMAL LOW (ref 45–182)
Saturation Ratios: 11 % — ABNORMAL LOW (ref 17.9–39.5)
TIBC: 370 ug/dL (ref 250–450)
UIBC: 328 ug/dL

## 2021-08-26 LAB — BASIC METABOLIC PANEL
Anion gap: 13 (ref 5–15)
BUN: 20 mg/dL (ref 8–23)
CO2: 23 mmol/L (ref 22–32)
Calcium: 9.3 mg/dL (ref 8.9–10.3)
Chloride: 97 mmol/L — ABNORMAL LOW (ref 98–111)
Creatinine, Ser: 1.2 mg/dL (ref 0.61–1.24)
GFR, Estimated: >60 mL/min (ref >60)
Glucose, Bld: 268 mg/dL — ABNORMAL HIGH (ref 70–99)
Potassium: 3.6 mmol/L (ref 3.5–5.1)
Sodium: 133 mmol/L — ABNORMAL LOW (ref 135–145)

## 2021-08-26 LAB — VITAMIN B12: Vitamin B-12: 574 pg/mL (ref 180–914)

## 2021-08-26 LAB — LIPID PANEL
Cholesterol: 102 mg/dL (ref 0–200)
HDL: 46 mg/dL
LDL Cholesterol: 35 mg/dL (ref 0–99)
Total CHOL/HDL Ratio: 2.2 ratio
Triglycerides: 103 mg/dL
VLDL: 21 mg/dL (ref 0–40)

## 2021-08-26 LAB — FOLATE: Folate: 41.2 ng/mL (ref >5.9)

## 2021-08-26 LAB — VITAMIN D 25 HYDROXY (VIT D DEFICIENCY, FRACTURES): Vit D, 25-Hydroxy: 20.58 ng/mL — ABNORMAL LOW (ref 30–100)

## 2021-08-26 LAB — PHOSPHORUS: Phosphorus: 3.2 mg/dL (ref 2.5–4.6)

## 2021-08-26 LAB — MAGNESIUM: Magnesium: 2.2 mg/dL (ref 1.7–2.4)

## 2021-08-26 LAB — GLUCOSE, CAPILLARY
Glucose-Capillary: 169 mg/dL — ABNORMAL HIGH (ref 70–99)
Glucose-Capillary: 248 mg/dL — ABNORMAL HIGH (ref 70–99)

## 2021-08-26 MED ORDER — OXYCODONE HCL 5 MG PO TABS
5.0000 mg | ORAL_TABLET | ORAL | Status: DC | PRN
Start: 2021-08-26 — End: 2021-08-26

## 2021-08-26 MED ORDER — VITAMIN D (ERGOCALCIFEROL) 1.25 MG (50000 UNIT) PO CAPS
50000.0000 [IU] | ORAL_CAPSULE | ORAL | Status: DC
  Filled 2021-08-26: qty 1

## 2021-08-26 MED ORDER — VITAMIN D (ERGOCALCIFEROL) 1.25 MG (50000 UNIT) PO CAPS: 50000.0000 [IU] | ORAL_CAPSULE | ORAL | 0 refills | Status: AC

## 2021-08-26 NOTE — Progress Notes (Signed)
Subjective: 4 Days Post-Op Procedure(s) (LRB): TOTAL KNEE ARTHROPLASTY (Right) Patient reports pain as mild to moderate Patient is doing well with no complaints. Possible seizure yesterday. Negative cardiac work up. With negative CTA chest Denies any CP, SOB, ABD pain.  No dizziness or lightheadedness. We will continue therapy today.  Slow progress with PT yesterday Plan is to go Home after hospital stay.  Objective: Vital signs in last 24 hours: Temp:  [97.4 F (36.3 C)-98.9 F (37.2 C)] 98.3 F (36.8 C) (11/08 0700) Pulse Rate:  [101-117] 106 (11/08 0700) Resp:  [16-18] 16 (11/08 0700) BP: (119-142)/(71-83) 131/82 (11/08 0700) SpO2:  [94 %-99 %] 95 % (11/08 0754)  Intake/Output from previous day: 11/07 0701 - 11/08 0700 In: -  Out: 1500 [Urine:1500] Intake/Output this shift: No intake/output data recorded.  Recent Labs    08/25/21 1217  HGB 8.9*   Recent Labs    08/25/21 1217  WBC 11.4*  RBC 3.40*  HCT 28.5*  PLT 218   Recent Labs    08/25/21 1217  NA 133*  K 4.2  CL 99  CO2 22  BUN 19  CREATININE 1.09  GLUCOSE 186*  CALCIUM 8.9   No results for input(s): LABPT, INR in the last 72 hours.  EXAM General - Patient is Alert, Appropriate, and Oriented Extremity - Neurovascular intact Sensation intact distally Intact pulses distally Dorsiflexion/Plantar flexion intact No cellulitis present Compartment soft Dressing - dressing C/D/I and no drainage, Praveena intact without drainage Motor Function - intact, moving foot and toes well on exam.   Past Medical History:  Diagnosis Date   Allergy    Aortic atherosclerosis (HCC)    Asthma    C. difficile diarrhea    Chronic pain    Collagenous colitis    Coronary artery disease    a.) PCI with 2.75 x 18 mm Resolute Onyx DES x 1 to prox/mid LAD on 09/05/2019   DDD (degenerative disc disease), cervical    DDD (degenerative disc disease), lumbar    GERD (gastroesophageal reflux disease)    Grade I  diastolic dysfunction    Hepatic steatosis    Hyperlipidemia    Hypertension    Liver cancer ([place]) 03/2015   Migraines    Myocardial infarction (HCC)     OSA on CPAP    Seizures (HCC)    several as child when sick.  None since age 51   Stroke [facility])    'mini-stroke" 30 yrs ago. no deficits.   T2DM (type 2 diabetes mellitus) ([place])    Wears dentures    full upper and lower    Assessment/Plan:   4 Days Post-Op Procedure(s) (LRB): TOTAL KNEE ARTHROPLASTY (Right) Principal Problem:   S/P TKR (total knee replacement) using cement, right Active Problems:   Emphysema lung (HCC)   Hyperlipidemia associated with type 2 diabetes mellitus (HCC)   Hypertension associated with diabetes (HCC)   GERD (gastroesophageal reflux disease)   Sleep apnea   Uncontrolled type 2 diabetes mellitus with hyperglycemia (HCC)   Depression   Chest pain   Asthma   CAD (coronary artery disease)   Chronic diastolic CHF (congestive heart failure) (HCC)   Seizure (HCC)  Estimated body mass index is 36.2 kg/m as calculated from the following:   Height as of this encounter: 5\' 8"  (1.727 m).   Weight as of this encounter: 108 kg. Advance diet Up with therapy Pain well controlled.  Continue with current pain regimen.  Continue to monitor vital signs.  Appreciate IM input. Care management to assist with discharge to home with home health PT today pending completion of PT goals and medical clearance  DVT Prophylaxis - Lovenox, Foot Pumps, and TED hose Weight-Bearing as tolerated to right leg   T. Rachelle Hora, PA-C [place] 08/26/2021, 9:36 AM

## 2021-08-26 NOTE — Progress Notes (Signed)
*  PRELIMINARY RESULTS* Echocardiogram 2D Echocardiogram has been performed.  Edward Schneider 08/26/2021, 11:06 AM

## 2021-08-26 NOTE — Progress Notes (Signed)
PT Cancellation Note  Patient Details Name: Edward Schneider. MRN: [id number] DOB: 11/12/1955   Cancelled Treatment:     Therapist in this pm, pt sitting edge of bed with belongings packed. Pt stated he was cleared for d/c home and awaiting IV to be taken out. Pt declined mobility, questions/concerns answered, pt to continue HHPT.   [name] 08/26/2021, 4:25 PM

## 2021-08-26 NOTE — Progress Notes (Signed)
OT Cancellation Note  Patient Details Name: Edward Schneider. MRN: [id number] DOB: 10/27/1955   Cancelled Treatment:     Pt just recently back in bed and trying to take a nap after sitting up in chair during lunch.  Declined OT at this time.   Leta Speller, MS, OTR/L  Darleene Cleaver 08/26/2021, 1:41 PM

## 2021-08-26 NOTE — Progress Notes (Addendum)
[facility] Progress Note  Patient: Edward Schneider.    ZES:[id number]  DOA: 08/22/2021     Date of Service: the patient was seen and examined on 08/26/2021  No chief complaint on file.  Brief hospital course: Edward Schneider. is an 65 y.o. male with PMH of hypertension, hyperlipidemia, diabetes mellitus, asthma, stroke, GERD, depression, seizure not medications (last seizure was 2 years ago), OSA on CPAP, CAD, myocardial infarction, stent placement, dCHF, CAD,  who was admitted by orthopedic surgery team and underwent right knee replacement 3 days ago.  We are asked to consult due to chest pain and tachycardia..   Assessment and Plan: Principal Problem:   S/P TKR (total knee replacement) using cement, right Active Problems:   Emphysema lung (HCC)   Hyperlipidemia associated with type 2 diabetes mellitus (HCC)   Hypertension associated with diabetes (HCC)   GERD (gastroesophageal reflux disease)   Sleep apnea   Uncontrolled type 2 diabetes mellitus with hyperglycemia (HCC)   Depression   Chest pain   Asthma   CAD (coronary artery disease)   Chronic diastolic CHF (congestive heart failure) (HCC)   Seizure (HCC)       S/P TKR (total knee replacement) using cement, right: -pain management per primary orthopedic team -Patient is on Lovenox DVT prophylaxis   Chest pain and hx of CAD: s/p of Stent.  Patient has sinus tachycardia likely due to severe pain nitial troponin negative.    Troponin negative x2, LDL 35 BNP 48 - prn Nitroglycerin and oxycodone - Aspirin, lipitor, Zetia, Imdur - Risk factor stratification:   -CAT to r/o PE --> negative for PE TTE showed LVEF 65%, normal LV wall motion. Continue monitor on telemetry, currently patient is chest pain-free and denies any palpitations Follow thyroid panel which is still in process to rule out hyperthyroidism as a differential for sinus tachycardia  Emphysema lung (HCC) and asthma: No wheezing or rhonchi on  auscultation -Bronchodilators -As needed Mucinex   Hyperlipidemia associated with type 2 diabetes mellitus (HCC) -Lipitor and Zetia   Hypertension associated with diabetes (HCC) -IV hydralazine as needed -Lotensin, Imdur   GERD (gastroesophageal reflux disease) -Protonix, Carafate   Sleep apnea -CPAP   Uncontrolled type 2 diabetes mellitus with hyperglycemia ([place]): Recent A1c 7.6, poorly controlled.  Patient still taking Humalog, farxiga, Dulaglutide -on glargine insulin 32 units daily -Sliding scale insulin   Depression -Continue home medications   Chronic diastolic CHF (congestive heart failure) ([place]): 2D echo on 12/14/2019 showed EF 60 to 65% with grade 1 diastolic dysfunction.  Patient has trace leg edema, but no JVD.  No oxygen desaturation.  BNP normal for age.  CHF seem to be compensated. -Watch volume status closely   Seizure [facility]): Patient not taking medications currently.  Last seizure was 2 years ago. -Seizure precaution -As needed Ativan for seizure  Vitamin D deficiency, started vitamin D 50k units p.o. weekly.  Follow with PCP to repeat vitamin D level after 3 months.  Body mass index is 36.2 kg/m.  Interventions:        Diet: Carb modified DVT Prophylaxis: Subcutaneous Lovenox   Advance goals of care discussion: Full code  Family Communication: family was present at bedside, at the time of interview.  The pt provided permission to discuss medical plan with the family. Opportunity was given to ask question and all questions were answered satisfactorily.   Disposition:  Pt is from Home, admitted under orthopedics, s/p right total knee replacement, [facility] hospitalist consulted  for chest pain and tachycardia.  Disposition plan depends on the primary team   Subjective: No significant overnight events, patient denies any chest pain or palpitations, no any dizziness or headache, no shortness of breath.  Patient was complaining of pain in the right knee 10/10  while standing and he was trying to change his stress.  Patient stated that he is just feeling generalized weakness and mild shortness of breath but no any other active issues.  Physical Exam: General:  alert oriented to time, place, and person.  Appear in mild distress, affect appropriate Eyes: PERRLA ENT: Oral Mucosa Clear, moist  Neck: no JVD,  Cardiovascular: S1 and S2 Present, no Murmur,  Respiratory: good respiratory effort, Bilateral Air entry equal and Decreased, no Crackles, no wheezes Abdomen: Bowel Sound present, Soft and no tenderness,  Skin: no rashes Extremities: no Pedal edema, no calf tenderness Neurologic: without any new focal findings Gait not checked due to patient safety concerns  Vitals:   08/26/21 0511 08/26/21 0700 08/26/21 0754 08/26/21 1133  BP: 127/83 131/82  131/81  Pulse: (!) 101 (!) 106  (!) 109  Resp: 16 16  16   Temp: 98 F (36.7 C) 98.3 F (36.8 C)  98.2 F (36.8 C)  TempSrc:      SpO2: 96% 95% 95% 96%  Weight:      Height:        Intake/Output Summary (Last 24 hours) at 08/26/2021 1414 Last data filed at 08/26/2021 0600 Gross per 24 hour  Intake --  Output 1300 ml  Net -1300 ml   Filed Weights   08/22/21 1206  Weight: 108 kg    Data Reviewed: I have personally reviewed and interpreted daily labs, tele strips, imagings as discussed above. I reviewed all nursing notes, pharmacy notes, vitals, pertinent old records I have discussed plan of care as described above with RN and patient/family.  CBC: Recent Labs  Lab 08/22/21 1644 08/23/21 0403 08/25/21 1217 08/26/21 0923  WBC 12.9* 12.2* 11.4* 11.0*  HGB 11.5* 10.7* 8.9* 10.0*  HCT 36.6* 34.4* 28.5* 31.4*  MCV 83.9 86.0 83.8 84.6  PLT 218 215 218 889   Basic Metabolic Panel: Recent Labs  Lab 08/22/21 1644 08/23/21 0403 08/25/21 1217 08/26/21 0923  NA  --  137 133* 133*  K  --  4.5 4.2 3.6  CL  --  103 99 97*  CO2  --  26 22 23   GLUCOSE  --  168* 186* 268*  BUN  --  13  19 20   CREATININE 1.07 0.99 1.09 1.20  CALCIUM  --  8.5* 8.9 9.3  MG  --   --   --  2.2  PHOS  --   --   --  3.2    Studies: CT Angio Chest Pulmonary Embolism (PE) W or WO Contrast  Result Date: 08/25/2021 CLINICAL DATA:  Postop x3 days, chest pain, tachycardia, shortness of breath, evaluate for PE. EXAM: CT ANGIOGRAPHY CHEST WITH CONTRAST TECHNIQUE: Multidetector CT imaging of the chest was performed using the standard protocol during bolus administration of intravenous contrast. Multiplanar CT image reconstructions and MIPs were obtained to evaluate the vascular anatomy. CONTRAST:  42mL OMNIPAQUE IOHEXOL 350 MG/ML SOLN COMPARISON:  05/29/2020. FINDINGS: Cardiovascular: Satisfactory opacification of the bilateral pulmonary emboli to the segmental level. No evidence of pulmonary embolism. Although not tailored for evaluation of the thoracic aorta, there is no evidence of thoracic aortic aneurysm or dissection. The heart is normal in size.  No pericardial  effusion. Three vessel coronary atherosclerosis. Mediastinum/Nodes: No suspicious mediastinal lymphadenopathy. Visualized thyroid is unremarkable. Lungs/Pleura: Minimal bibasilar atelectasis. No focal consolidation. No suspicious pulmonary nodules. No pleural effusion or pneumothorax. Upper Abdomen: Visualized upper abdomen is notable for mild hepatic steatosis. Musculoskeletal: Mild degenerative changes of the lower thoracic spine. Review of the MIP images confirms the above findings. IMPRESSION: No evidence of pulmonary embolism. No evidence of acute cardiopulmonary disease. Aortic Atherosclerosis (ICD10-I70.0). Electronically Signed   By: Julian Hy M.D.   On: 08/25/2021 17:25   ECHOCARDIOGRAM COMPLETE  Result Date: 08/26/2021    ECHOCARDIOGRAM REPORT   Patient Name:   Edward Schneider. Date of Exam: 08/26/2021 Medical Rec #:  [id number]         Height:       68.0 in Accession #:    [id number]        Weight:       238.1 lb Date of Birth:   1956-09-14         BSA:          2.201 m Patient Age:    74 years          BP:           Not listed in chart/Not listed                                                 in chart mmHg Patient Gender: M                 HR:           Not listed in chart bpm. Exam Location:  [facility] Procedure: 2D Echo, Cardiac Doppler and Color Doppler Indications:     Ventricular Tachycardia I47.2  History:         Patient has prior history of Echocardiogram examinations, most                  recent 12/14/2019. CAD; Risk Factors:Hypertension, Diabetes and                  Dyslipidemia.  Sonographer:     Sherrie Sport Referring Phys:  [id number] Val Riles Diagnosing Phys: Nelva Bush MD  Sonographer Comments: Suboptimal apical window. IMPRESSIONS  1. Left ventricular ejection fraction, by estimation, is 60 to 65%. The left ventricle has normal function. The left ventricle has no regional wall motion abnormalities. There is mild left ventricular hypertrophy. Indeterminate diastolic filling due to E-A fusion.  2. Right ventricular systolic function is normal. The right ventricular size is normal. Mildly increased right ventricular wall thickness. Tricuspid regurgitation signal is inadequate for assessing PA pressure.  3. Right atrial size was mildly dilated.  4. The mitral valve is abnormal. No evidence of mitral valve regurgitation. No evidence of mitral stenosis.  5. The aortic valve has an indeterminant number of cusps. There is mild calcification of the aortic valve. There is mild thickening of the aortic valve. Aortic valve regurgitation is not visualized. Mild aortic valve sclerosis is present, with no evidence of aortic valve stenosis. FINDINGS  Left Ventricle: Left ventricular ejection fraction, by estimation, is 60 to 65%. The left ventricle has normal function. The left ventricle has no regional wall motion abnormalities. The left ventricular internal cavity size was normal in size. There is  mild left ventricular hypertrophy.  Indeterminate diastolic filling  due to E-A fusion. Right Ventricle: The right ventricular size is normal. Mildly increased right ventricular wall thickness. Right ventricular systolic function is normal. Tricuspid regurgitation signal is inadequate for assessing PA pressure. Left Atrium: Left atrial size was normal in size. Right Atrium: Right atrial size was mildly dilated. Pericardium: The pericardium was not well visualized. Mitral Valve: The mitral valve is abnormal. There is mild thickening of the mitral valve leaflet(s). No evidence of mitral valve regurgitation. No evidence of mitral valve stenosis. MV peak gradient, 8.4 mmHg. The mean mitral valve gradient is 4.0 mmHg. Tricuspid Valve: The tricuspid valve is not well visualized. Tricuspid valve regurgitation is trivial. Aortic Valve: The aortic valve has an indeterminant number of cusps. There is mild calcification of the aortic valve. There is mild thickening of the aortic valve. Aortic valve regurgitation is not visualized. Mild aortic valve sclerosis is present, with  no evidence of aortic valve stenosis. Aortic valve mean gradient measures 4.0 mmHg. Aortic valve peak gradient measures 7.3 mmHg. Aortic valve area, by VTI measures 2.06 cm. Pulmonic Valve: The pulmonic valve was not well visualized. Pulmonic valve regurgitation is not visualized. No evidence of pulmonic stenosis. Aorta: The aortic root is normal in size and structure. Pulmonary Artery: The pulmonary artery is not well seen. Venous: The inferior vena cava was not well visualized. IAS/Shunts: The interatrial septum was not well visualized.  LEFT VENTRICLE PLAX 2D LVIDd:         3.59 cm   Diastology LVIDs:         2.57 cm   LV e' medial:    7.51 cm/s LV PW:         1.26 cm   LV E/e' medial:  10.1 LV IVS:        0.98 cm   LV e' lateral:   11.20 cm/s LVOT diam:     2.00 cm   LV E/e' lateral: 6.8 LV SV:         43 LV SV Index:   20 LVOT Area:     3.14 cm  RIGHT VENTRICLE RV Basal diam:  3.72 cm  RV S prime:     14.90 cm/s TAPSE (M-mode): 2.1 cm LEFT ATRIUM             Index        RIGHT ATRIUM           Index LA diam:        2.90 cm 1.32 cm/m   RA Area:     19.70 cm LA Vol (A2C):   76.2 ml 34.62 ml/m  RA Volume:   49.70 ml  22.58 ml/m LA Vol (A4C):   25.6 ml 11.63 ml/m LA Biplane Vol: 46.3 ml 21.04 ml/m  AORTIC VALVE                    PULMONIC VALVE AV Area (Vmax):    2.05 cm     RVOT Peak grad: 7 mmHg AV Area (Vmean):   1.99 cm AV Area (VTI):     2.06 cm AV Vmax:           135.00 cm/s AV Vmean:          91.100 cm/s AV VTI:            0.209 m AV Peak Grad:      7.3 mmHg AV Mean Grad:      4.0 mmHg LVOT Vmax:  88.10 cm/s LVOT Vmean:        57.800 cm/s LVOT VTI:          0.137 m LVOT/AV VTI ratio: 0.66  AORTA Ao Root diam: 2.40 cm MITRAL VALVE MV Area (PHT): 4.93 cm     SHUNTS MV Area VTI:   1.85 cm     Systemic VTI:  0.14 m MV Peak grad:  8.4 mmHg     Systemic Diam: 2.00 cm MV Mean grad:  4.0 mmHg     Pulmonic VTI:  0.204 m MV Vmax:       1.45 m/s MV Vmean:      94.0 cm/s MV Decel Time: 154 msec MV E velocity: 75.80 cm/s MV A velocity: 120.00 cm/s MV E/A ratio:  0.63 Christopher End MD Electronically signed by Nelva Bush MD Signature Date/Time: 08/26/2021/12:23:57 PM    Final     Scheduled Meds:  arformoterol  15 mcg Nebulization BID   And   umeclidinium bromide  1 puff Inhalation Daily   vitamin C  1,000 mg Oral Daily   aspirin EC  81 mg Oral Daily   atorvastatin  80 mg Oral QHS   benazepril  10 mg Oral Daily   docusate sodium  100 mg Oral BID   doxepin  50 mg Oral QHS   DULoxetine  30 mg Oral BID WC   DULoxetine  60 mg Oral QHS   enoxaparin (LOVENOX) injection  30 mg Subcutaneous Q12H   ezetimibe  10 mg Oral Daily   insulin aspart  0-5 Units Subcutaneous QHS   insulin aspart  0-9 Units Subcutaneous TID WC   insulin glargine-yfgn  32 Units Subcutaneous QHS   isosorbide mononitrate  60 mg Oral Daily   loratadine  10 mg Oral Daily   pantoprazole  40 mg Oral BID    pregabalin  50 mg Oral TID   senna-docusate  1 tablet Oral QHS   sucralfate  1 g Oral TID WC & HS   traMADol  50 mg Oral Q6H   traZODone  100 mg Oral QHS   Continuous Infusions:  sodium chloride Stopped (08/24/21 0731)   methocarbamol (ROBAXIN) IV     PRN Meds: acetaminophen, albuterol, alum & mag hydroxide-simeth, bisacodyl, dextromethorphan-guaiFENesin, diphenhydrAMINE, hydrALAZINE, LORazepam, menthol-cetylpyridinium **OR** phenol, methocarbamol **OR** methocarbamol (ROBAXIN) IV, metoCLOPramide **OR** metoCLOPramide (REGLAN) injection, nitroGLYCERIN, nitroGLYCERIN, ondansetron **OR** ondansetron (ZOFRAN) IV, oxyCODONE, oxyCODONE, polyethylene glycol, sodium phosphate, zolpidem  Time spent: 35 minutes  Author: Val Riles. MD [facility] 08/26/2021 2:14 PM  To reach On-call, see care teams to locate the attending and reach out to them via [email or web address]. If 7PM-7AM, please contact night-coverage If you still have difficulty reaching the attending provider, please page the [facility] (Director on Call) for [facility] on amion for assistance.
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
## 2021-08-26 NOTE — Progress Notes (Signed)
Inpatient Diabetes Program Recommendations  AACE/ADA: New Consensus Statement on Inpatient Glycemic Control (2015)  Target Ranges:  Prepandial:   less than 140 mg/dL      Peak postprandial:   less than 180 mg/dL (1-2 hours)      Critically ill patients:  140 - 180 mg/dL  Results for SZYMON, FOILES (MRN [id number]) as of 08/26/2021 11:57  Ref. Range 08/26/2021 07:50 08/26/2021 11:34  Glucose-Capillary Latest Ref Range: 70 - 99 mg/dL 169 (H) 248 (H)    Home DM Meds: Farxiga 5 mg daily Trulicity 1.5 mg weekly Basaglar 32 units QHS Humalog 22-16-16 units TID   Current Orders: Semglee 32 units QHS  Novolog 0-9 units TID AC + HS    Stopped the [place]  MD- Note afternoon CBGs elevated  Please consider starting Novolog Meal Coverage:   Novolog 3 units TID with meals    --Will follow patient during hospitalization--  Wyn Quaker RN, MSN, CDE Diabetes Coordinator Inpatient Glycemic Control Team Team Pager: [phone] (8a-5p)

## 2021-08-27 LAB — THYROID PANEL WITH TSH
Free Thyroxine Index: 2.2 (ref 1.2–4.9)
T3 Uptake Ratio: 31 % (ref 24–39)
T4, Total: 7.1 ug/dL (ref 4.5–12.0)
TSH: 0.686 u[IU]/mL (ref 0.450–4.500)

## 2021-08-28 DIAGNOSIS — K8681 Exocrine pancreatic insufficiency: Secondary | ICD-10-CM | POA: Diagnosis not present

## 2021-08-28 DIAGNOSIS — Z794 Long term (current) use of insulin: Secondary | ICD-10-CM | POA: Diagnosis not present

## 2021-08-28 DIAGNOSIS — K219 Gastro-esophageal reflux disease without esophagitis: Secondary | ICD-10-CM | POA: Diagnosis not present

## 2021-08-28 DIAGNOSIS — I2510 Atherosclerotic heart disease of native coronary artery without angina pectoris: Secondary | ICD-10-CM | POA: Diagnosis not present

## 2021-08-28 DIAGNOSIS — K760 Fatty (change of) liver, not elsewhere classified: Secondary | ICD-10-CM | POA: Diagnosis not present

## 2021-08-28 DIAGNOSIS — E1165 Type 2 diabetes mellitus with hyperglycemia: Secondary | ICD-10-CM | POA: Diagnosis not present

## 2021-08-28 DIAGNOSIS — G8929 Other chronic pain: Secondary | ICD-10-CM | POA: Diagnosis not present

## 2021-08-28 DIAGNOSIS — Z981 Arthrodesis status: Secondary | ICD-10-CM | POA: Diagnosis not present

## 2021-08-28 DIAGNOSIS — R569 Unspecified convulsions: Secondary | ICD-10-CM | POA: Diagnosis not present

## 2021-08-28 DIAGNOSIS — I4891 Unspecified atrial fibrillation: Secondary | ICD-10-CM | POA: Diagnosis not present

## 2021-08-28 DIAGNOSIS — Z955 Presence of coronary angioplasty implant and graft: Secondary | ICD-10-CM | POA: Diagnosis not present

## 2021-08-28 DIAGNOSIS — Z8673 Personal history of transient ischemic attack (TIA), and cerebral infarction without residual deficits: Secondary | ICD-10-CM | POA: Diagnosis not present

## 2021-08-28 DIAGNOSIS — M5136 Other intervertebral disc degeneration, lumbar region: Secondary | ICD-10-CM | POA: Diagnosis not present

## 2021-08-28 DIAGNOSIS — Z96651 Presence of right artificial knee joint: Secondary | ICD-10-CM | POA: Diagnosis not present

## 2021-08-28 DIAGNOSIS — I110 Hypertensive heart disease with heart failure: Secondary | ICD-10-CM | POA: Diagnosis not present

## 2021-08-28 DIAGNOSIS — Z471 Aftercare following joint replacement surgery: Secondary | ICD-10-CM | POA: Diagnosis not present

## 2021-08-28 DIAGNOSIS — I5032 Chronic diastolic (congestive) heart failure: Secondary | ICD-10-CM | POA: Diagnosis not present

## 2021-08-28 DIAGNOSIS — J439 Emphysema, unspecified: Secondary | ICD-10-CM | POA: Diagnosis not present

## 2021-08-28 DIAGNOSIS — G4730 Sleep apnea, unspecified: Secondary | ICD-10-CM | POA: Diagnosis not present

## 2021-08-28 DIAGNOSIS — G43909 Migraine, unspecified, not intractable, without status migrainosus: Secondary | ICD-10-CM | POA: Diagnosis not present

## 2021-08-28 DIAGNOSIS — Z79891 Long term (current) use of opiate analgesic: Secondary | ICD-10-CM | POA: Diagnosis not present

## 2021-08-28 DIAGNOSIS — M5459 Other low back pain: Secondary | ICD-10-CM | POA: Diagnosis not present

## 2021-08-28 DIAGNOSIS — Z7982 Long term (current) use of aspirin: Secondary | ICD-10-CM | POA: Diagnosis not present

## 2021-08-28 DIAGNOSIS — M5030 Other cervical disc degeneration, unspecified cervical region: Secondary | ICD-10-CM | POA: Diagnosis not present

## 2021-08-31 ENCOUNTER — Emergency Department: Payer: Medicare Other

## 2021-08-31 ENCOUNTER — Other Ambulatory Visit: Payer: Self-pay

## 2021-08-31 ENCOUNTER — Emergency Department
Admission: EM | Admit: 2021-08-31 | Discharge: 2021-08-31 | Disposition: A | Discharge status: Home / Self Care | Payer: Medicare Other | Attending: Emergency Medicine | Admitting: Emergency Medicine

## 2021-08-31 DIAGNOSIS — Z4800 Encounter for change or removal of nonsurgical wound dressing: Secondary | ICD-10-CM | POA: Insufficient documentation

## 2021-08-31 DIAGNOSIS — M25461 Effusion, right knee: Secondary | ICD-10-CM | POA: Diagnosis not present

## 2021-08-31 DIAGNOSIS — Z87891 Personal history of nicotine dependence: Secondary | ICD-10-CM | POA: Diagnosis not present

## 2021-08-31 DIAGNOSIS — Z794 Long term (current) use of insulin: Secondary | ICD-10-CM | POA: Insufficient documentation

## 2021-08-31 DIAGNOSIS — E1142 Type 2 diabetes mellitus with diabetic polyneuropathy: Secondary | ICD-10-CM | POA: Diagnosis not present

## 2021-08-31 DIAGNOSIS — I5032 Chronic diastolic (congestive) heart failure: Secondary | ICD-10-CM | POA: Insufficient documentation

## 2021-08-31 DIAGNOSIS — M25561 Pain in right knee: Secondary | ICD-10-CM | POA: Insufficient documentation

## 2021-08-31 DIAGNOSIS — Z96641 Presence of right artificial hip joint: Secondary | ICD-10-CM | POA: Diagnosis not present

## 2021-08-31 DIAGNOSIS — J45909 Unspecified asthma, uncomplicated: Secondary | ICD-10-CM | POA: Insufficient documentation

## 2021-08-31 DIAGNOSIS — G894 Chronic pain syndrome: Secondary | ICD-10-CM | POA: Diagnosis not present

## 2021-08-31 DIAGNOSIS — Z96651 Presence of right artificial knee joint: Secondary | ICD-10-CM | POA: Insufficient documentation

## 2021-08-31 DIAGNOSIS — Z471 Aftercare following joint replacement surgery: Secondary | ICD-10-CM | POA: Diagnosis not present

## 2021-08-31 DIAGNOSIS — Z79899 Other long term (current) drug therapy: Secondary | ICD-10-CM | POA: Insufficient documentation

## 2021-08-31 DIAGNOSIS — Z7982 Long term (current) use of aspirin: Secondary | ICD-10-CM | POA: Insufficient documentation

## 2021-08-31 DIAGNOSIS — G8918 Other acute postprocedural pain: Secondary | ICD-10-CM | POA: Insufficient documentation

## 2021-08-31 DIAGNOSIS — Z8505 Personal history of malignant neoplasm of liver: Secondary | ICD-10-CM | POA: Diagnosis not present

## 2021-08-31 DIAGNOSIS — E119 Type 2 diabetes mellitus without complications: Secondary | ICD-10-CM | POA: Diagnosis not present

## 2021-08-31 DIAGNOSIS — D72829 Elevated white blood cell count, unspecified: Secondary | ICD-10-CM | POA: Insufficient documentation

## 2021-08-31 DIAGNOSIS — I25118 Atherosclerotic heart disease of native coronary artery with other forms of angina pectoris: Secondary | ICD-10-CM | POA: Insufficient documentation

## 2021-08-31 DIAGNOSIS — Z5189 Encounter for other specified aftercare: Secondary | ICD-10-CM

## 2021-08-31 LAB — COMPREHENSIVE METABOLIC PANEL
ALT: 25 U/L (ref 0–44)
AST: 28 U/L (ref 15–41)
Albumin: 3.6 g/dL (ref 3.5–5.0)
Alkaline Phosphatase: 66 U/L (ref 38–126)
Anion gap: 11 (ref 5–15)
BUN: 19 mg/dL (ref 8–23)
CO2: 20 mmol/L — ABNORMAL LOW (ref 22–32)
Calcium: 9.7 mg/dL (ref 8.9–10.3)
Chloride: 101 mmol/L (ref 98–111)
Creatinine, Ser: 1.06 mg/dL (ref 0.61–1.24)
GFR, Estimated: >60 mL/min (ref >60)
Glucose, Bld: 245 mg/dL — ABNORMAL HIGH (ref 70–99)
Potassium: 4.4 mmol/L (ref 3.5–5.1)
Sodium: 132 mmol/L — ABNORMAL LOW (ref 135–145)
Total Bilirubin: 0.7 mg/dL (ref 0.3–1.2)
Total Protein: 7.7 g/dL (ref 6.5–8.1)

## 2021-08-31 LAB — CBC WITH DIFFERENTIAL/PLATELET
Abs Immature Granulocytes: 0.07 10*3/uL (ref 0.00–0.07)
Basophils Absolute: 0.2 10*3/uL — ABNORMAL HIGH (ref 0.0–0.1)
Basophils Relative: 1 %
Eosinophils Absolute: 0.4 10*3/uL (ref 0.0–0.5)
Eosinophils Relative: 3 %
HCT: 34 % — ABNORMAL LOW (ref 39.0–52.0)
Hemoglobin: 10.5 g/dL — ABNORMAL LOW (ref 13.0–17.0)
Immature Granulocytes: 1 %
Lymphocytes Relative: 22 %
Lymphs Abs: 3 10*3/uL (ref 0.7–4.0)
MCH: 26.2 pg (ref 26.0–34.0)
MCHC: 30.9 g/dL (ref 30.0–36.0)
MCV: 84.8 fL (ref 80.0–100.0)
Monocytes Absolute: 1.1 10*3/uL — ABNORMAL HIGH (ref 0.1–1.0)
Monocytes Relative: 8 %
Neutro Abs: 9.1 10*3/uL — ABNORMAL HIGH (ref 1.7–7.7)
Neutrophils Relative %: 65 %
Platelets: 417 10*3/uL — ABNORMAL HIGH (ref 150–400)
RBC: 4.01 MIL/uL — ABNORMAL LOW (ref 4.22–5.81)
RDW: 15.2 % (ref 11.5–15.5)
WBC: 13.8 10*3/uL — ABNORMAL HIGH (ref 4.0–10.5)
nRBC: 0.1 % (ref 0.0–0.2)

## 2021-08-31 MED ORDER — KETAMINE HCL 10 MG/ML IJ SOLN
50.0000 mg | Freq: Once | INTRAMUSCULAR | Status: AC
  Administered 2021-08-31: 50 mg via INTRAMUSCULAR
  Filled 2021-08-31: qty 1

## 2021-08-31 MED ORDER — KETOROLAC TROMETHAMINE 30 MG/ML IJ SOLN
30.0000 mg | Freq: Once | INTRAMUSCULAR | Status: AC
  Administered 2021-08-31: 30 mg via INTRAMUSCULAR
  Filled 2021-08-31: qty 1

## 2021-08-31 MED ORDER — OXYCODONE HCL 5 MG PO TABS
20.0000 mg | ORAL_TABLET | Freq: Once | ORAL | Status: AC
  Administered 2021-08-31: 20 mg via ORAL
  Filled 2021-08-31: qty 4

## 2021-08-31 MED ORDER — ACETAMINOPHEN 500 MG PO TABS
1000.0000 mg | ORAL_TABLET | Freq: Once | ORAL | Status: AC
  Administered 2021-08-31: 1000 mg via ORAL
  Filled 2021-08-31: qty 2

## 2021-08-31 NOTE — ED Provider Notes (Signed)
[facility] Emergency Department Provider Note ____________________________________________   Event Date/Time   First MD Initiated Contact with Patient 08/31/21 1239     (approximate)  I have reviewed the triage vital signs and the nursing notes.  HISTORY  Chief Complaint Post-op Problem   HPI Edward Schneider. is a 65 y.o. malewho presents to the ED for evaluation of post op pain.   Chart review indicates 9 days ago on 11/4 patient had a total knee arthroplasty on the right side.  With Dr. Rudene Christians.  History of metabolic syndrome. Chronic pain syndrome with oxycodone 69m, tramadol and pregabalin when I review PDMP.  Patient presents to the ED, accompanied by his wife, for evaluation of acute on chronic pain to his right knee.  For the past 3-4 days, he reports the pain has been increasingly worsening to his right knee, primarily overlying his surgical site.  Despite taking his oxycodone 15 mg 4-5 times per day, as needed OxyContin 10 mg, he has had poorly controlled pain.  Denies any fevers, but does report sweats in the setting of severe pain.  He has a wound VAC to his right knee that he has not been using regularly because it increases his pain.  He denies any discharge, purulence or spreading erythema around his surgical site.  Denies any falls or injuries.    Past Medical History:  Diagnosis Date   Allergy    Aortic atherosclerosis (HCC)    Asthma    C. difficile diarrhea    Chronic pain    Collagenous colitis    Coronary artery disease    a.) PCI with 2.75 x 18 mm Resolute Onyx DES x 1 to prox/mid LAD on 09/05/2019   DDD (degenerative disc disease), cervical    DDD (degenerative disc disease), lumbar    GERD (gastroesophageal reflux disease)    Grade I diastolic dysfunction    Hepatic steatosis    Hyperlipidemia    Hypertension    Liver cancer ([place] 03/2015   Migraines    Myocardial infarction (HCC)     OSA on CPAP    Seizures ([place]     several as child when sick.  None since age 65  Stroke ([facility]    'mini-stroke" 30 yrs ago. no deficits.   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/2021   Status post total hip replacement, right 06/03/2021   Senile purpura ([place] 12/06/2020   Family history of malignant neoplasm of gastrointestinal tract    Depression 05/29/2020   Depression, major, single episode, moderate ([place] 03/06/2020   Uncontrolled type 2 diabetes mellitus with hyperglycemia ([place] 10/29/2019   Coronary artery disease of native artery of native heart with stable angina pectoris ([place] 08/31/2019   Pain due to onychomycosis of toenails of both feet 08/10/2019   Morbid obesity ([place] 05/17/2019   BPH (benign prostatic hyperplasia) 04/27/2018   Knee pain, right 02/08/2018   Sleep apnea 02/08/2018   GERD (gastroesophageal reflux disease) 04/04/2017   Advanced care planning/counseling discussion 03/29/2017   DM type 2  with diabetic peripheral neuropathy ([place]) 09/30/2016   Insomnia 12/24/2015   Hyperlipidemia associated with type 2 diabetes mellitus ([place]) 09/23/2015   Bilateral carotid artery stenosis 09/11/2015   Atherosclerosis of abdominal aorta ([place]) 08/26/2015   Migraine headache 08/21/2015   Emphysema lung ([place]) 04/25/2015   Hypertension associated with diabetes ([place]) 04/25/2015   DDD (degenerative disc disease), cervical 03/27/2015   DDD (degenerative disc disease), lumbar 03/27/2015   Cervical post-laminectomy syndrome 03/27/2015   Pancreatic insufficiency 01/28/2015   Chronic back pain 12/24/2014    Past Surgical History:  Procedure Laterality  Date   APPENDECTOMY     BACK SURGERY     CARDIAC CATHETERIZATION     No stent placed in his "64's"   CERVICAL FUSION     COLONOSCOPY WITH PROPOFOL N/A 03/06/2016   Procedure: COLONOSCOPY WITH PROPOFOL;  Surgeon: Lucilla Lame, MD;  Location: [place];  Service: Endoscopy;  Laterality: N/A;  requests early   COLONOSCOPY WITH PROPOFOL N/A 06/18/2020   Procedure: COLONOSCOPY WITH PROPOFOL;  Surgeon: Lucilla Lame, MD;  Location: [facility] ENDOSCOPY;  Service: Endoscopy;  Laterality: N/A;   ESOPHAGOGASTRODUODENOSCOPY (EGD) WITH PROPOFOL N/A 09/20/2017   Procedure: ESOPHAGOGASTRODUODENOSCOPY (EGD) WITH PROPOFOL;  Surgeon: Lucilla Lame, MD;  Location: [place];  Service: Endoscopy;  Laterality: N/A;  Diabetic - oral meds   FINGER SURGERY Left    INTRAVASCULAR PRESSURE WIRE/FFR STUDY N/A 09/05/2019   Procedure: INTRAVASCULAR PRESSURE WIRE/FFR STUDY;  Surgeon: Nelva Bush, MD;  Location: [place] [facility] LAB;  Service: Cardiovascular;  Laterality: N/A;   KNEE SURGERY Right    LEFT HEART CATH AND CORONARY ANGIOGRAPHY Left 09/05/2019   Procedure: LEFT HEART CATH AND CORONARY ANGIOGRAPHY (2.75 x 18 mm Resolute Onyx DES x 1 to prox/mid LAD);  Surgeon: Nelva Bush, MD;  Location: [place] [facility];  Service: Cardiovascular;  Laterality: Left;   NECK SURGERY     spleen surgery     TOE SURGERY Right    TOTAL HIP ARTHROPLASTY Right 06/03/2021   Procedure: TOTAL HIP ARTHROPLASTY ANTERIOR APPROACH;  Surgeon: Hessie Knows, MD;  Location: [facility] ORS;  Service: Orthopedics;  Laterality: Right;   TOTAL KNEE ARTHROPLASTY Right 08/22/2021   Procedure: TOTAL KNEE ARTHROPLASTY;  Surgeon: Hessie Knows, MD;  Location: [facility] ORS;  Service: Orthopedics;  Laterality: Right;    Prior to Admission medications   Medication Sig Start Date End Date Taking? Authorizing Provider  acetaminophen (TYLENOL) 650 MG CR tablet Take 1,300 mg by mouth every 8 (eight) hours.    [provider]   albuterol (VENTOLIN HFA) 108 (90 Base) MCG/ACT inhaler Inhale 2 puffs into the lungs every 6 (six) hours as needed for wheezing or shortness of breath. 12/24/20   Laurin Coder, MD  Ascorbic Acid (VITAMIN C) 1000 MG tablet Take 1,000 mg by mouth daily.     [provider]  aspirin EC 81 MG tablet Take 81 mg by mouth daily. Swallow whole.    [provider]  atorvastatin (LIPITOR) 80 MG tablet TAKE 1 TABLET BY MOUTH AT BEDTIME 04/24/21   End, Harrell Gave, MD  benazepril (LOTENSIN) 10 MG tablet Take 1 tablet (10 mg total) by mouth daily. Patient taking differently: Take 10 mg by mouth every morning. 08/04/21   End, Harrell Gave, MD  calcium carbonate (OS-CAL) 600 MG TABS tablet Take 600 mg by mouth daily.     [provider]  cetirizine (ZYRTEC) 10 MG tablet Take 10 mg by mouth daily. 07/30/20   [provider]  Cholecalciferol (VITAMIN D3) 25 MCG (1000 UT) CHEW Chew 1,000 Units by mouth daily.     [provider]  CINNAMON PO Take 1,000 mg by mouth 2 (two) times daily.     [provider]  clobetasol cream (TEMOVATE) 4.19 % Apply 1 application topically 2 (two) times daily. 07/17/20   Kathrine Haddock, NP  Continuous Blood Gluc Sensor (FREESTYLE LIBRE 2 SENSOR) MISC Use as instructed to check blood sugar daily 01/16/21   Shamleffer, Melanie Crazier, MD  cyclobenzaprine (FLEXERIL) 10 MG tablet Take 1 tablet (10 mg total) by mouth 3 (three) times daily as needed for muscle spasms. Patient taking differently: Take 10 mg by mouth 3 (three) times daily. 01/07/18   Lucilla Lame, MD  dapagliflozin propanediol (FARXIGA) 5 MG TABS tablet Take 1 tablet (5 mg total) by mouth daily. 10/25/20   Shamleffer, Melanie Crazier, MD  diclofenac Sodium (VOLTAREN) 1 % GEL Apply 2 g topically 4 (four) times daily as needed (pain).  07/10/20   [provider]  diphenhydrAMINE (BENADRYL) 25 mg capsule Take 50 mg by mouth in the morning, at noon, and at bedtime.     [provider]  docusate sodium (COLACE) 100 MG capsule Take 1 capsule (100 mg total) by mouth 2 (two) times daily. 06/04/21   Duanne Guess, PA-C  doxepin (SINEQUAN) 25 MG capsule Take 50 mg by mouth at bedtime.  07/31/19   [provider]  Dulaglutide (TRULICITY) 1.5 QQ/2.2LN SOPN Inject 1.5 mg into the skin once a week. Friday    [provider]  DULoxetine (CYMBALTA) 30 MG capsule Take 30 mg by mouth 2 (two) times daily with breakfast and lunch. 07/30/21   [provider]  DULoxetine (CYMBALTA) 60 MG capsule Take 1 capsule (60 mg total) by mouth at bedtime. 07/31/20   Cannady, Henrine Screws T, NP  enoxaparin (LOVENOX) 40 MG/0.4ML injection Inject 0.4 mLs (40 mg total) into the skin daily for 14 days. 08/24/21 09/07/21  Duanne Guess, PA-C  ezetimibe (ZETIA) 10 MG tablet Take 1 tablet by mouth once daily Patient taking differently: Take 10 mg by mouth every morning. 04/24/21   End, Harrell Gave, MD  Ginger, Zingiber officinalis, (GINGER PO) Take 1 tablet by mouth daily.    [provider]  Ginseng 100 MG CAPS Take 100 mg by mouth daily.     [provider]  Insulin Glargine (BASAGLAR KWIKPEN) 100 UNIT/ML Inject 32 Units into the skin at bedtime.    [provider]  insulin lispro (HUMALOG KWIKPEN) 200 UNIT/ML KwikPen Inject 16-22 Units into the skin See admin instructions. Injecting 22 units with Breakfast, 16 units with lunch and supper    [provider]  isosorbide mononitrate (IMDUR) 60 MG 24 hr tablet Take 1 tablet (60 mg total) by mouth daily. Patient taking differently: Take 60 mg by mouth every morning. 11/06/20   Cannady, Henrine Screws T, NP  Multiple Vitamins-Minerals (CENTRUM SILVER 50+MEN) TABS Take 1 tablet by mouth daily.    [provider]  mupirocin ointment (BACTROBAN) 2 % Apply 1 application topically daily as needed. 11/23/18   [provider]  NARCAN 4 MG/0.1ML LIQD nasal spray kit Place 0.4 mg into the  nose as needed (opioid overdose).  02/20/19   [provider]  nitroGLYCERIN (NITROSTAT) 0.4 MG SL tablet Place 0.4 mg under the tongue every 5 (five) minutes as needed for chest pain.  08/30/19   [provider]  Omega-3 1000 MG CAPS Take 1,000 mg by  mouth daily.     [provider]  oxyCODONE 10 MG TABS Take 1-1.5 tablets (10-15 mg total) by mouth every 3 (three) hours as needed for severe pain (pain score 7-10). 08/24/21   Duanne Guess, PA-C  pantoprazole (PROTONIX) 40 MG tablet Take 1 tablet by mouth twice daily 06/16/21   Cannady, Henrine Screws T, NP  pregabalin (LYRICA) 50 MG capsule TAKE 1 CAPSULE BY MOUTH THREE TIMES DAILY 11/29/18   Guadalupe Maple, MD  PRESCRIPTION MEDICATION CPAP    [provider]  senna-docusate (SENOKOT-S) 8.6-50 MG tablet Take 1 tablet by mouth at bedtime as needed for mild constipation. Patient taking differently: Take 1 tablet by mouth at bedtime. 06/04/21   Duanne Guess, PA-C  sucralfate (CARAFATE) 1 g tablet TAKE 1 TABLET BY MOUTH 4 TIMES DAILY WITH MEALS AND AT BEDTIME 08/21/21   Cannady, Henrine Screws T, NP  Tiotropium Bromide-Olodaterol (STIOLTO RESPIMAT) 2.5-2.5 MCG/ACT AERS Inhale 2 puffs into the lungs daily as needed (shortness of breath). 12/24/20   Laurin Coder, MD  traMADol (ULTRAM) 50 MG tablet Take 1 tablet (50 mg total) by mouth every 6 (six) hours as needed. 08/24/21   Duanne Guess, PA-C  traZODone (DESYREL) 100 MG tablet TAKE 1 TABLET BY MOUTH AT BEDTIME AS NEEDED FOR SLEEP Patient taking differently: Take 100 mg by mouth at bedtime. 08/12/21   Cannady, Henrine Screws T, NP  vitamin A 10000 UNIT capsule Take 10,000 Units by mouth daily.    [provider]  vitamin B-12 (CYANOCOBALAMIN) 1000 MCG tablet Take 1,000 mcg by mouth daily.    [provider]  Vitamin D, Ergocalciferol, (DRISDOL) 1.25 MG (50000 UNIT) CAPS capsule Take 1 capsule (50,000 Units total) by mouth every 7 (seven) days. 08/26/21 11/24/21  Val Riles, MD  Vitamin E 400 units TABS Take 400 Units by mouth daily.     [provider]    Allergies Bee venom, Crestor [rosuvastatin calcium], Fentanyl, Gabapentin, Shellfish allergy, Furosemide, Buprenorphine hcl, Chlorhexidine gluconate, and Simvastatin  Family History  Problem Relation Age of Onset   Arthritis Mother    Diabetes Mother    Kidney disease Mother    Heart disease Mother    Hypertension Mother    Arthritis Father    Hearing loss Father    Hypertension Father    Heart disease Father    Diabetes Sister    Heart disease Sister    Diabetes Daughter    Diabetes Maternal Aunt    Diabetes Maternal Grandmother    Heart Problems Brother    Heart Problems Brother    Heart Problems Brother    Heart attack Maternal Grandfather    Colon cancer Paternal Grandfather     Social History Social History   Tobacco Use   Smoking status: Former    Packs/day: 2.00    Years: 50.00    Pack years: 100.00    Types: Cigarettes    Quit date: 2011    Years since quitting: 11.8   Smokeless tobacco: Never  Vaping Use   Vaping Use: Never used  Substance Use Topics   Alcohol use: No    Alcohol/week: 0.0 standard drinks   Drug use: No    Review of Systems  Constitutional: No fever/chills Eyes: No visual changes. ENT: No sore throat. Cardiovascular: Denies chest pain. Respiratory: Denies shortness of breath. Gastrointestinal: No abdominal pain.  No nausea, no vomiting.  No diarrhea.  No constipation. Genitourinary: Negative for dysuria. Musculoskeletal: Negative for back  pain. Positive for atraumatic acute on chronic right knee pain. Skin: Negative for rash. Neurological: Negative for headaches, focal weakness or numbness. ____________________________________________   PHYSICAL EXAM:  VITAL SIGNS: Vitals:   08/31/21 1400 08/31/21 1430  BP: (!) 148/94 135/88  Pulse: 88 83  Resp:    Temp:    SpO2: 98% 100%     Constitutional: Alert and oriented. Well  appearing and in no acute distress.  Moaning in pain when I first entered the room, but when I engage in conversation he is pleasant and conversational in full sentences without distress. Eyes: Conjunctivae are normal. PERRL. EOMI. Head: Atraumatic. Nose: No congestion/rhinnorhea. Mouth/Throat: Mucous membranes are moist.  Oropharynx non-erythematous. Neck: No stridor. No cervical spine tenderness to palpation. Cardiovascular: Tachycardic rate, regular rhythm. Grossly normal heart sounds.  Good peripheral circulation. Respiratory: Normal respiratory effort.  No retractions. Lungs CTAB. Gastrointestinal: Soft , nondistended, nontender to palpation. No CVA tenderness. Musculoskeletal: Wound VAC attached to the anterior right knee, no suction.  I remove this to visualize intact staple line over his knee, as pictured below.  No erythema, purulence, streaking red rash or signs of acute superimposed infection. Neurologic:  Normal speech and language. No gross focal neurologic deficits are appreciated. Skin:  Skin is warm, dry and intact. No rash noted. Psychiatric: Mood and affect are normal. Speech and behavior are normal.    ____________________________________________   LABS (all labs ordered are listed, but only abnormal results are displayed)  Labs Reviewed  CBC WITH DIFFERENTIAL/PLATELET - Abnormal; Notable for the following components:      Result Value   WBC 13.8 (*)    RBC 4.01 (*)    Hemoglobin 10.5 (*)    HCT 34.0 (*)    Platelets 417 (*)    Neutro Abs 9.1 (*)    Monocytes Absolute 1.1 (*)    Basophils Absolute 0.2 (*)    All other components within normal limits  COMPREHENSIVE METABOLIC PANEL - Abnormal; Notable for the following components:   Sodium 132 (*)    CO2 20 (*)    Glucose, Bld 245 (*)    All other components within normal limits   ____________________________________________  12 Lead EKG   ____________________________________________  RADIOLOGY  ED MD  interpretation: X-ray of the knee without evidence of fracture, hardware malalignment  Official radiology report(s): DG Knee Complete 4 Views Right  Result Date: 08/31/2021 CLINICAL DATA:  post op, uncontrolled pain EXAM: RIGHT KNEE - COMPLETE 4+ VIEW COMPARISON:  August 22, 2021 FINDINGS: Osteopenia. Status post knee arthroplasty. Orthopedic hardware is intact and without periprosthetic fracture or lucency. Surgical staples. No acute fracture. Small joint effusion. Resolution of soft tissue air. Subcutaneous edema. IMPRESSION: Status post knee arthroplasty without evidence of hardware complication. Electronically Signed   By: Valentino Saxon M.D.   On: 08/31/2021 11:35    ____________________________________________   PROCEDURES and INTERVENTIONS  Procedure(s) performed (including Critical Care):  Procedures  Medications  ketamine (KETALAR) injection 50 mg (50 mg Intramuscular Given 08/31/21 1355)  acetaminophen (TYLENOL) tablet 1,000 mg (1,000 mg Oral Given 08/31/21 1351)  oxyCODONE (Oxy IR/ROXICODONE) immediate release tablet 20 mg (20 mg Oral Given 08/31/21 1349)  ketorolac (TORADOL) 30 MG/ML injection 30 mg (30 mg Intramuscular Given 08/31/21 1352)    ____________________________________________   MDM / ED COURSE   65 year old male with chronic pain syndrome on chronic opiates presents to the ED with poorly controlled postoperative pain after a total knee replacement 9 days ago, requiring pain control in the  ED, but ultimately amenable to outpatient management.  No evidence of postoperative infection, wound dehiscence, trauma or hardware malfunction.  X-rays are benign.  Blood work is noted to have a mild leukocytosis, but he frequently has WBCs in this range and he has no further evidence of sepsis, infection or acute pathology.  Due to his decades of chronic opiate use, used a single dose of IM ketamine with great control of his pain.  He has appointment tomorrow with his pain  management group and I see no barriers to outpatient management.  Return precautions for the ED discussed.  Clinical Course as of 08/31/21 1459  Sun Aug 31, 2021  1259 Shared decision making with the patient regarding pain control.  We discussed his chronic opiate use and while his opiates at home may not be helping.  We discussed different mechanisms of different analgesic medications.  He is agreeable to intramuscular dose of ketamine. [DS]  6948 I briefly discuss the case with Dr. Rudene Christians, he agrees with pain control and likely outpatient management. [DS]  [id number]. [DS]  8299 [id number].  Patient has a large smile on his face and reports feeling much better.  Denies any pain.  He is ready to go home. [DS]    Clinical Course User Index [DS] Vladimir Crofts, MD    ____________________________________________   FINAL CLINICAL IMPRESSION(S) / ED DIAGNOSES  Final diagnoses:  Post-operative pain  Visit for wound check  Chronic pain syndrome     ED Discharge Orders     None        Akyla Vavrek Tamala Julian   Note:  This document was prepared using Dragon voice recognition software and may include unintentional dictation errors.    Vladimir Crofts, MD 08/31/21 1500

## 2021-08-31 NOTE — ED Triage Notes (Signed)
Pt states he had right knee replacement a week ago and since Friday  he has been having severe uncontrolled pain, [place] currently on 10mg  percocet, last taken around 7am today

## 2021-08-31 NOTE — ED Notes (Signed)
Dc ppw provided. Followup information given. Pt assisted off unit with wife via wheelchair. Pt verbal consent for dc given.

## 2021-09-01 DIAGNOSIS — M47897 Other spondylosis, lumbosacral region: Secondary | ICD-10-CM | POA: Diagnosis not present

## 2021-09-01 DIAGNOSIS — M5136 Other intervertebral disc degeneration, lumbar region: Secondary | ICD-10-CM | POA: Diagnosis not present

## 2021-09-01 DIAGNOSIS — G4700 Insomnia, unspecified: Secondary | ICD-10-CM | POA: Diagnosis not present

## 2021-09-01 DIAGNOSIS — M9943 Connective tissue stenosis of neural canal of lumbar region: Secondary | ICD-10-CM | POA: Diagnosis not present

## 2021-09-01 DIAGNOSIS — G8929 Other chronic pain: Secondary | ICD-10-CM | POA: Diagnosis not present

## 2021-09-01 DIAGNOSIS — M48062 Spinal stenosis, lumbar region with neurogenic claudication: Secondary | ICD-10-CM | POA: Diagnosis not present

## 2021-09-01 DIAGNOSIS — M25559 Pain in unspecified hip: Secondary | ICD-10-CM | POA: Diagnosis not present

## 2021-09-01 DIAGNOSIS — M792 Neuralgia and neuritis, unspecified: Secondary | ICD-10-CM | POA: Diagnosis not present

## 2021-09-01 DIAGNOSIS — R609 Edema, unspecified: Secondary | ICD-10-CM | POA: Diagnosis not present

## 2021-09-01 DIAGNOSIS — E1142 Type 2 diabetes mellitus with diabetic polyneuropathy: Secondary | ICD-10-CM | POA: Diagnosis not present

## 2021-09-01 DIAGNOSIS — M25519 Pain in unspecified shoulder: Secondary | ICD-10-CM | POA: Diagnosis not present

## 2021-09-01 DIAGNOSIS — M545 Low back pain, unspecified: Secondary | ICD-10-CM | POA: Diagnosis not present

## 2021-09-01 DIAGNOSIS — M47816 Spondylosis without myelopathy or radiculopathy, lumbar region: Secondary | ICD-10-CM | POA: Diagnosis not present

## 2021-09-01 DIAGNOSIS — G894 Chronic pain syndrome: Secondary | ICD-10-CM | POA: Diagnosis not present

## 2021-09-01 DIAGNOSIS — E1165 Type 2 diabetes mellitus with hyperglycemia: Secondary | ICD-10-CM | POA: Diagnosis not present

## 2021-09-02 ENCOUNTER — Other Ambulatory Visit: Payer: Self-pay | Admitting: Nurse Practitioner

## 2021-09-02 DIAGNOSIS — I110 Hypertensive heart disease with heart failure: Secondary | ICD-10-CM | POA: Diagnosis not present

## 2021-09-02 DIAGNOSIS — Z7982 Long term (current) use of aspirin: Secondary | ICD-10-CM | POA: Diagnosis not present

## 2021-09-02 DIAGNOSIS — E1165 Type 2 diabetes mellitus with hyperglycemia: Secondary | ICD-10-CM | POA: Diagnosis not present

## 2021-09-02 DIAGNOSIS — M5136 Other intervertebral disc degeneration, lumbar region: Secondary | ICD-10-CM | POA: Diagnosis not present

## 2021-09-02 DIAGNOSIS — R569 Unspecified convulsions: Secondary | ICD-10-CM | POA: Diagnosis not present

## 2021-09-02 DIAGNOSIS — M5459 Other low back pain: Secondary | ICD-10-CM | POA: Diagnosis not present

## 2021-09-02 DIAGNOSIS — Z471 Aftercare following joint replacement surgery: Secondary | ICD-10-CM | POA: Diagnosis not present

## 2021-09-02 DIAGNOSIS — Z794 Long term (current) use of insulin: Secondary | ICD-10-CM | POA: Diagnosis not present

## 2021-09-02 DIAGNOSIS — I2510 Atherosclerotic heart disease of native coronary artery without angina pectoris: Secondary | ICD-10-CM | POA: Diagnosis not present

## 2021-09-02 DIAGNOSIS — G4730 Sleep apnea, unspecified: Secondary | ICD-10-CM | POA: Diagnosis not present

## 2021-09-02 DIAGNOSIS — Z981 Arthrodesis status: Secondary | ICD-10-CM | POA: Diagnosis not present

## 2021-09-02 DIAGNOSIS — G8929 Other chronic pain: Secondary | ICD-10-CM | POA: Diagnosis not present

## 2021-09-02 DIAGNOSIS — I4891 Unspecified atrial fibrillation: Secondary | ICD-10-CM | POA: Diagnosis not present

## 2021-09-02 DIAGNOSIS — K219 Gastro-esophageal reflux disease without esophagitis: Secondary | ICD-10-CM | POA: Diagnosis not present

## 2021-09-02 DIAGNOSIS — Z96651 Presence of right artificial knee joint: Secondary | ICD-10-CM | POA: Diagnosis not present

## 2021-09-02 DIAGNOSIS — Z8673 Personal history of transient ischemic attack (TIA), and cerebral infarction without residual deficits: Secondary | ICD-10-CM | POA: Diagnosis not present

## 2021-09-02 DIAGNOSIS — K8681 Exocrine pancreatic insufficiency: Secondary | ICD-10-CM | POA: Diagnosis not present

## 2021-09-02 DIAGNOSIS — Z79891 Long term (current) use of opiate analgesic: Secondary | ICD-10-CM | POA: Diagnosis not present

## 2021-09-02 DIAGNOSIS — G43909 Migraine, unspecified, not intractable, without status migrainosus: Secondary | ICD-10-CM | POA: Diagnosis not present

## 2021-09-02 DIAGNOSIS — J439 Emphysema, unspecified: Secondary | ICD-10-CM | POA: Diagnosis not present

## 2021-09-02 DIAGNOSIS — M5030 Other cervical disc degeneration, unspecified cervical region: Secondary | ICD-10-CM | POA: Diagnosis not present

## 2021-09-02 DIAGNOSIS — I5032 Chronic diastolic (congestive) heart failure: Secondary | ICD-10-CM | POA: Diagnosis not present

## 2021-09-02 DIAGNOSIS — K760 Fatty (change of) liver, not elsewhere classified: Secondary | ICD-10-CM | POA: Diagnosis not present

## 2021-09-02 DIAGNOSIS — Z955 Presence of coronary angioplasty implant and graft: Secondary | ICD-10-CM | POA: Diagnosis not present

## 2021-09-02 NOTE — Telephone Encounter (Signed)
Requested medications are due for refill today.  unknown  Requested medications are on the active medications list.  no  Last refill. 08/12/2021  Future visit scheduled.   yes  Notes to clinic.  Medication was d/c'd 08/26/2021.

## 2021-09-04 ENCOUNTER — Ambulatory Visit: Payer: Medicare Other | Admitting: Internal Medicine

## 2021-09-04 ENCOUNTER — Encounter: Payer: Self-pay | Admitting: Internal Medicine

## 2021-09-04 ENCOUNTER — Other Ambulatory Visit: Payer: Self-pay

## 2021-09-04 VITALS — BP 114/70 | HR 88 | Ht 68.0 in | Wt 230.0 lb

## 2021-09-04 DIAGNOSIS — I4891 Unspecified atrial fibrillation: Secondary | ICD-10-CM | POA: Diagnosis not present

## 2021-09-04 DIAGNOSIS — I2510 Atherosclerotic heart disease of native coronary artery without angina pectoris: Secondary | ICD-10-CM | POA: Diagnosis not present

## 2021-09-04 DIAGNOSIS — Z96651 Presence of right artificial knee joint: Secondary | ICD-10-CM | POA: Diagnosis not present

## 2021-09-04 DIAGNOSIS — E1165 Type 2 diabetes mellitus with hyperglycemia: Secondary | ICD-10-CM

## 2021-09-04 DIAGNOSIS — J439 Emphysema, unspecified: Secondary | ICD-10-CM | POA: Diagnosis not present

## 2021-09-04 DIAGNOSIS — M5030 Other cervical disc degeneration, unspecified cervical region: Secondary | ICD-10-CM | POA: Diagnosis not present

## 2021-09-04 DIAGNOSIS — E1142 Type 2 diabetes mellitus with diabetic polyneuropathy: Secondary | ICD-10-CM

## 2021-09-04 DIAGNOSIS — Z7982 Long term (current) use of aspirin: Secondary | ICD-10-CM | POA: Diagnosis not present

## 2021-09-04 DIAGNOSIS — Z955 Presence of coronary angioplasty implant and graft: Secondary | ICD-10-CM | POA: Diagnosis not present

## 2021-09-04 DIAGNOSIS — K760 Fatty (change of) liver, not elsewhere classified: Secondary | ICD-10-CM | POA: Diagnosis not present

## 2021-09-04 DIAGNOSIS — Z794 Long term (current) use of insulin: Secondary | ICD-10-CM | POA: Diagnosis not present

## 2021-09-04 DIAGNOSIS — Z981 Arthrodesis status: Secondary | ICD-10-CM | POA: Diagnosis not present

## 2021-09-04 DIAGNOSIS — M5136 Other intervertebral disc degeneration, lumbar region: Secondary | ICD-10-CM | POA: Diagnosis not present

## 2021-09-04 DIAGNOSIS — I110 Hypertensive heart disease with heart failure: Secondary | ICD-10-CM | POA: Diagnosis not present

## 2021-09-04 DIAGNOSIS — E1159 Type 2 diabetes mellitus with other circulatory complications: Secondary | ICD-10-CM | POA: Diagnosis not present

## 2021-09-04 DIAGNOSIS — K219 Gastro-esophageal reflux disease without esophagitis: Secondary | ICD-10-CM | POA: Diagnosis not present

## 2021-09-04 DIAGNOSIS — R569 Unspecified convulsions: Secondary | ICD-10-CM | POA: Diagnosis not present

## 2021-09-04 DIAGNOSIS — G43909 Migraine, unspecified, not intractable, without status migrainosus: Secondary | ICD-10-CM | POA: Diagnosis not present

## 2021-09-04 DIAGNOSIS — Z8673 Personal history of transient ischemic attack (TIA), and cerebral infarction without residual deficits: Secondary | ICD-10-CM | POA: Diagnosis not present

## 2021-09-04 DIAGNOSIS — Z79891 Long term (current) use of opiate analgesic: Secondary | ICD-10-CM | POA: Diagnosis not present

## 2021-09-04 DIAGNOSIS — G4730 Sleep apnea, unspecified: Secondary | ICD-10-CM | POA: Diagnosis not present

## 2021-09-04 DIAGNOSIS — Z471 Aftercare following joint replacement surgery: Secondary | ICD-10-CM | POA: Diagnosis not present

## 2021-09-04 DIAGNOSIS — K8681 Exocrine pancreatic insufficiency: Secondary | ICD-10-CM | POA: Diagnosis not present

## 2021-09-04 DIAGNOSIS — I5032 Chronic diastolic (congestive) heart failure: Secondary | ICD-10-CM | POA: Diagnosis not present

## 2021-09-04 DIAGNOSIS — M5459 Other low back pain: Secondary | ICD-10-CM | POA: Diagnosis not present

## 2021-09-04 DIAGNOSIS — G8929 Other chronic pain: Secondary | ICD-10-CM | POA: Diagnosis not present

## 2021-09-04 MED ORDER — DAPAGLIFLOZIN PROPANEDIOL 10 MG PO TABS: 10.0000 mg | ORAL_TABLET | Freq: Every day | ORAL | 3 refills | Status: DC

## 2021-09-04 MED ORDER — TRULICITY 3 MG/0.5ML ~~LOC~~ SOAJ: 3.0000 mg | SUBCUTANEOUS | 3 refills | Status: DC

## 2021-09-04 NOTE — Progress Notes (Signed)
Name: Edward Schneider.  Age/ Sex: 65 y.o., male   MRN/ DOB: [id number], [birth year]     PCP: Marjie Skiff, NP   Reason for Endocrinology Evaluation: Type 2 Diabetes Mellitus  Initial Endocrine Consultative Visit: 01/24/2019    PATIENT IDENTIFIER: [name]. is a 65 y.o. male with a past medical history of T2DM. The patient has followed with Endocrinology clinic since 01/24/2019 for consultative assistance with management of his diabetes.  DIABETIC HISTORY:  Mr. Branigan was diagnosed with T2DM in 2017. He has been on Jardiance in 2017 but due to cost was discontinued, as well as [place]. His hemoglobin A1c has ranged from 6.2% in 2018, peaking at 10.0 % in 2019.  On his initial visit to our clinic his A1c 10.9% , he was on metformin which we stopped in 04/2019 due to diarrhea.   S/P PCI with DES 08/2019 SUBJECTIVE:   During the last visit (02/27/2021): A1c 8.8%. We continued [place], humalog as well as Ozempic.    Today (09/04/2021): Mr. Altamira is here for a follow up on his diabetes management. He checks his blood sugars 2-3 times daily through freestyle libre. The patient have has not had a hypoglycemic episodes since the last clinic visit .    He has had right hip 05/2021 and knee replacement a couple weeks ago, currently using walker   Denies nausea, vomiting  or diarrhea    HOME DIABETES REGIMEN:  Basaglar 30 units QHS  Humalog 22 units with Breakfast and continue 16 units with Lunch and 16 units with supper Trulicity 1.5  mg weekly  Farxiga 5 mg daily     CONTINUOUS GLUCOSE MONITORING RECORD INTERPRETATION    Dates of Recording:11/4-11/17/2022  Sensor description:freestyle libre  Results statistics:   CGM use % of time 42  Average and SD 207/26.9  Time in range   35 %  % Time Above 180 43  % Time above 250 22  % Time Below target 0    Glycemic patterns summary: Hyperglycemia noted during the day and night   Hyperglycemic episodes  postprandial    Hypoglycemic episodes occurred N/A  Overnight periods: High           DIABETIC COMPLICATIONS: Microvascular complications:  Neuropathy  Denies: CKD, retinopathy  Last eye exam: Completed 03/2020   Macrovascular complications:  CAD and CVA Denies: PVD    HISTORY:  Past Medical History:  Past Medical History:  Diagnosis Date   Allergy    Aortic atherosclerosis (HCC)    Asthma    C. difficile diarrhea    Chronic pain    Collagenous colitis    Coronary artery disease    a.) PCI with 2.75 x 18 mm Resolute Onyx DES x 1 to prox/mid LAD on 09/05/2019   DDD (degenerative disc disease), cervical    DDD (degenerative disc disease), lumbar    GERD (gastroesophageal reflux disease)    Grade I diastolic dysfunction    Hepatic steatosis    Hyperlipidemia    Hypertension    Liver cancer (HCC) 03/2015   Migraines    Myocardial infarction (HCC)     OSA on CPAP    Seizures (HCC)    several as child when sick.  None since age 68   Stroke [facility])    'mini-stroke" 30 yrs ago. no deficits.   T2DM (type 2 diabetes mellitus) (HCC)    Wears dentures    full upper and lower   Past Surgical  History:  Past Surgical History:  Procedure Laterality Date   APPENDECTOMY     BACK SURGERY     CARDIAC CATHETERIZATION     No stent placed in his "27's"   CERVICAL FUSION     COLONOSCOPY WITH PROPOFOL N/A 03/06/2016   Procedure: COLONOSCOPY WITH PROPOFOL;  Surgeon: Midge Minium, MD;  Location: [facility];  Service: Endoscopy;  Laterality: N/A;  requests early   COLONOSCOPY WITH PROPOFOL N/A 06/18/2020   Procedure: COLONOSCOPY WITH PROPOFOL;  Surgeon: Midge Minium, MD;  Location: [facility];  Service: Endoscopy;  Laterality: N/A;   ESOPHAGOGASTRODUODENOSCOPY (EGD) WITH PROPOFOL N/A 09/20/2017   Procedure: ESOPHAGOGASTRODUODENOSCOPY (EGD) WITH PROPOFOL;  Surgeon: Midge Minium, MD;  Location: [facility];  Service: Endoscopy;  Laterality: N/A;  Diabetic - oral meds    FINGER SURGERY Left    INTRAVASCULAR PRESSURE WIRE/FFR STUDY N/A 09/05/2019   Procedure: INTRAVASCULAR PRESSURE WIRE/FFR STUDY;  Surgeon: Yvonne Kendall, MD;  Location: [facility];  Service: Cardiovascular;  Laterality: N/A;   KNEE SURGERY Right    LEFT HEART CATH AND CORONARY ANGIOGRAPHY Left 09/05/2019   Procedure: LEFT HEART CATH AND CORONARY ANGIOGRAPHY (2.75 x 18 mm Resolute Onyx DES x 1 to prox/mid LAD);  Surgeon: Yvonne Kendall, MD;  Location: [facility];  Service: Cardiovascular;  Laterality: Left;   NECK SURGERY     spleen surgery     TOE SURGERY Right    TOTAL HIP ARTHROPLASTY Right 06/03/2021   Procedure: TOTAL HIP ARTHROPLASTY ANTERIOR APPROACH;  Surgeon: Kennedy Bucker, MD;  Location: [facility] ORS;  Service: Orthopedics;  Laterality: Right;   TOTAL KNEE ARTHROPLASTY Right 08/22/2021   Procedure: TOTAL KNEE ARTHROPLASTY;  Surgeon: Kennedy Bucker, MD;  Location: [facility];  Service: Orthopedics;  Laterality: Right;   Social History:  reports that he quit smoking about 11 years ago. His smoking use included cigarettes. He has a 100.00 pack-year smoking history. He has never used smokeless tobacco. He reports that he does not drink alcohol and does not use drugs. Family History:  Family History  Problem Relation Age of Onset   Arthritis Mother    Diabetes Mother    Kidney disease Mother    Heart disease Mother    Hypertension Mother    Arthritis Father    Hearing loss Father    Hypertension Father    Heart disease Father    Diabetes Sister    Heart disease Sister    Diabetes Daughter    Diabetes Maternal Aunt    Diabetes Maternal Grandmother    Heart Problems Brother    Heart Problems Brother    Heart Problems Brother    Heart attack Maternal Grandfather    Colon cancer Paternal Grandfather      HOME MEDICATIONS: Allergies as of 09/04/2021       Reactions   Bee Venom Anaphylaxis   Crestor [rosuvastatin Calcium] Shortness Of Breath, Swelling    Fentanyl Itching, Hives   blisters Patch   Gabapentin Diarrhea   Severe diarrhea which caused incontinence, loss of appetite and weight loss.   Shellfish Allergy Anaphylaxis, Swelling   Shrimp causes throat to swell and tingling in tongue.    Furosemide Nausea And Vomiting   Buprenorphine Hcl Itching   Chlorhexidine Gluconate Itching, Rash   Simvastatin Diarrhea        Medication List        Accurate as of September 04, 2021  7:14 AM. If you have any questions, ask your nurse  or doctor.          acetaminophen 650 MG CR tablet Commonly known as: TYLENOL Take 1,300 mg by mouth every 8 (eight) hours.   albuterol 108 (90 Base) MCG/ACT inhaler Commonly known as: Ventolin HFA Inhale 2 puffs into the lungs every 6 (six) hours as needed for wheezing or shortness of breath.   aspirin EC 81 MG tablet Take 81 mg by mouth daily. Swallow whole.   atorvastatin 80 MG tablet Commonly known as: LIPITOR TAKE 1 TABLET BY MOUTH AT BEDTIME   Basaglar KwikPen 100 UNIT/ML Inject 32 Units into the skin at bedtime.   benazepril 10 MG tablet Commonly known as: LOTENSIN Take 1 tablet (10 mg total) by mouth daily. What changed: when to take this   calcium carbonate 600 MG Tabs tablet Commonly known as: OS-CAL Take 600 mg by mouth daily.   Centrum Silver 50+Men Tabs Take 1 tablet by mouth daily.   cetirizine 10 MG tablet Commonly known as: ZYRTEC Take 10 mg by mouth daily.   CINNAMON PO Take 1,000 mg by mouth 2 (two) times daily.   clobetasol cream 0.05 % Commonly known as: TEMOVATE Apply 1 application topically 2 (two) times daily.   cyclobenzaprine 10 MG tablet Commonly known as: FLEXERIL Take 1 tablet (10 mg total) by mouth 3 (three) times daily as needed for muscle spasms. What changed: when to take this   dapagliflozin propanediol 5 MG Tabs tablet Commonly known as: Farxiga Take 1 tablet (5 mg total) by mouth daily.   diclofenac Sodium 1 % Gel Commonly known as:  VOLTAREN Apply 2 g topically 4 (four) times daily as needed (pain).   diphenhydrAMINE 25 mg capsule Commonly known as: BENADRYL Take 50 mg by mouth in the morning, at noon, and at bedtime.   docusate sodium 100 MG capsule Commonly known as: COLACE Take 1 capsule (100 mg total) by mouth 2 (two) times daily.   doxepin 25 MG capsule Commonly known as: SINEQUAN Take 50 mg by mouth at bedtime.   DULoxetine 60 MG capsule Commonly known as: CYMBALTA Take 1 capsule (60 mg total) by mouth at bedtime.   DULoxetine 30 MG capsule Commonly known as: CYMBALTA Take 30 mg by mouth 2 (two) times daily with breakfast and lunch.   enoxaparin 40 MG/0.4ML injection Commonly known as: LOVENOX Inject 0.4 mLs (40 mg total) into the skin daily for 14 days.   ezetimibe 10 MG tablet Commonly known as: ZETIA Take 1 tablet by mouth once daily What changed: when to take this   FreeStyle Libre 2 Sensor Misc Use as instructed to check blood sugar daily   GINGER PO Take 1 tablet by mouth daily.   Ginseng 100 MG Caps Take 100 mg by mouth daily.   HumaLOG KwikPen 200 UNIT/ML KwikPen Generic drug: insulin lispro Inject 16-22 Units into the skin See admin instructions. Injecting 22 units with Breakfast, 16 units with lunch and supper   isosorbide mononitrate 60 MG 24 hr tablet Commonly known as: IMDUR Take 1 tablet (60 mg total) by mouth daily. What changed: when to take this   mupirocin ointment 2 % Commonly known as: BACTROBAN Apply 1 application topically daily as needed.   Narcan 4 MG/0.1ML Liqd nasal spray kit Generic drug: naloxone Place 0.4 mg into the nose as needed (opioid overdose).   nitroGLYCERIN 0.4 MG SL tablet Commonly known as: NITROSTAT Place 0.4 mg under the tongue every 5 (five) minutes as needed for chest pain.  Omega-3 1000 MG Caps Take 1,000 mg by mouth daily.   Oxycodone HCl 10 MG Tabs Take 1-1.5 tablets (10-15 mg total) by mouth every 3 (three) hours as needed  for severe pain (pain score 7-10).   pantoprazole 40 MG tablet Commonly known as: PROTONIX Take 1 tablet by mouth twice daily   pregabalin 50 MG capsule Commonly known as: LYRICA TAKE 1 CAPSULE BY MOUTH THREE TIMES DAILY   PRESCRIPTION MEDICATION CPAP   senna-docusate 8.6-50 MG tablet Commonly known as: Senokot-S Take 1 tablet by mouth at bedtime as needed for mild constipation. What changed: when to take this   Stiolto Respimat 2.5-2.5 MCG/ACT Aers Generic drug: Tiotropium Bromide-Olodaterol Inhale 2 puffs into the lungs daily as needed (shortness of breath).   sucralfate 1 g tablet Commonly known as: CARAFATE TAKE 1 TABLET BY MOUTH 4 TIMES DAILY WITH MEALS AND AT BEDTIME   traMADol 50 MG tablet Commonly known as: ULTRAM Take 1 tablet (50 mg total) by mouth every 6 (six) hours as needed.   traZODone 100 MG tablet Commonly known as: DESYREL TAKE 1 TABLET BY MOUTH AT BEDTIME AS NEEDED FOR SLEEP What changed:  when to take this additional instructions   Trulicity 1.5 MG/0.5ML Sopn Generic drug: Dulaglutide Inject 1.5 mg into the skin once a week. Friday   vitamin A 16109 UNIT capsule Take 10,000 Units by mouth daily.   vitamin B-12 1000 MCG tablet Commonly known as: CYANOCOBALAMIN Take 1,000 mcg by mouth daily.   vitamin C 1000 MG tablet Take 1,000 mg by mouth daily.   Vitamin D (Ergocalciferol) 1.25 MG (50000 UNIT) Caps capsule Commonly known as: DRISDOL Take 1 capsule (50,000 Units total) by mouth every 7 (seven) days.   Vitamin D3 25 MCG (1000 UT) Chew Chew 1,000 Units by mouth daily.   Vitamin E 400 units Tabs Take 400 Units by mouth daily.         OBJECTIVE:   Vital Signs: BP 114/70 (BP Location: Left Arm, Patient Position: Sitting, Cuff Size: Small)   Pulse 88   Ht 5\' 8"  (1.727 m)   Wt 230 lb (104.3 kg)   SpO2 96%   BMI 34.97 kg/m   Wt Readings from Last 3 Encounters:  08/31/21 237 lb (107.5 kg)  08/22/21 238 lb 1.6 oz (108 kg)   08/13/21 238 lb 1.6 oz (108 kg)     Exam: General: Pt appears well and is in NAD  Lungs: Clear with good BS bilat with no rales, rhonchi, or wheezes  Heart: RRR   Extremities: No pretibial edema.    Neuro: MS is good with appropriate affect, pt is alert and Ox3       DM foot exam: 10/25/2020 The skin of the feet is without sores or ulcerationsThe pedal pulses are undetected on today's exam  The sensation is absent to a screening 5.07, 10 gram monofilament bilaterally         DATA REVIEWED:  Lab Results  Component Value Date   HGBA1C 8.3 (H) 08/23/2021   HGBA1C 7.6 (H) 05/05/2021   HGBA1C 8.8 (A) 02/27/2021   Lab Results  Component Value Date   MICROALBUR 30 (H) 03/05/2021   LDLCALC 35 08/26/2021   CREATININE 1.06 08/31/2021   Lab Results  Component Value Date   MICRALBCREAT <30 03/05/2021     Lab Results  Component Value Date   CHOL 102 08/26/2021   HDL 46 08/26/2021   LDLCALC 35 08/26/2021   TRIG 103 08/26/2021  CHOLHDL 2.2 08/26/2021         ASSESSMENT / PLAN / RECOMMENDATIONS:   1) 1) Type 2 Diabetes Mellitus, improving glycemic control, With neuropathic and macrovascular complications - Most recent A1c of  8.3 %. Goal A1c < 7.0 %.      -His A1c continues to be above goal, but it is trending down, he has noted hyperglycemia since his knee surgery -I am not going to change his insulin regimen today but will increase his Trulicity and [place] - He is on patient assistance program for his basal, prandial and trulicity and [place] -He is going to be provided with a correction scale to use for Humalog  MEDICATIONS:  Continue  Basaglar  30 units daily  Continue Humalog 22 units with Breakfast , 16 units with Lunch and 16 units with supper Increase Trulicity 3 mg weekly Increase Farxiga to 10 mg, 1 tablet with Breakfast  Correction factor: Humalog (BG -130/25)     EDUCATION / INSTRUCTIONS: BG monitoring instructions: Patient is instructed to check  his blood sugars 4 times a day, before meals . Call [facility] if: BG persistently < 70 I reviewed the Rule of 15 for the treatment of hypoglycemia in detail with the patient. Literature supplied.    F/U in  6 months    Signed electronically by: Lyndle Herrlich, MD  [facility]  [facility] [address] [employer] [address] [place], [place] [address] Phone: [phone] FAX: [phone]   CC: Marjie Skiff, NP [address] [place] [address] Phone: [phone]  Fax: [phone]  Return to Endocrinology clinic as below: Future Appointments  Date Time Provider Department Center  09/04/2021  8:50 AM Jakylah Bassinger, Konrad Dolores, MD [facility] None  10/02/2021  2:40 PM End, Cristal Deer, MD [facility]  10/17/2021 10:40 AM Marjie Skiff, NP [facility]  10/23/2021  3:15 PM Helane Gunther, DPM [facility]

## 2021-09-04 NOTE — Patient Instructions (Signed)
-   Continue Basaglar  30 units daily  - Continue Humalog 22 units with Breskfast and continue 16 units with Lunch and Supper  - Increased Trulicity 3  mg weekly - Increase Farxiga 10 mg , 1 tablet with Breakfast    -Humalog correctional insulin: ADD extra units on insulin to your meal-time Humalog dose if your blood sugars are higher than 155. Use the scale below to help guide you:   Blood sugar before meal Number of units to inject  Less than 155 0 unit  156 -  180 1 units  181 -  205 2 units  206 -  230 3 units  231 -  255 4 units  256 -  280 5 units  281 -  305 6 units  306 -  330 7 units  331 -  355 8 units       HOW TO TREAT LOW BLOOD SUGARS (Blood sugar LESS THAN 70 MG/DL) Please follow the RULE OF 15 for the treatment of hypoglycemia treatment (when your (blood sugars are less than 70 mg/dL)   STEP 1: Take 15 grams of carbohydrates when your blood sugar is low, which includes:  3-4 GLUCOSE TABS  OR 3-4 OZ OF JUICE OR REGULAR SODA OR ONE TUBE OF GLUCOSE GEL    STEP 2: RECHECK blood sugar in 15 MINUTES STEP 3: If your blood sugar is still low at the 15 minute recheck --> then, go back to STEP 1 and treat AGAIN with another 15 grams of carbohydrates.

## 2021-09-05 DIAGNOSIS — M5030 Other cervical disc degeneration, unspecified cervical region: Secondary | ICD-10-CM | POA: Diagnosis not present

## 2021-09-05 DIAGNOSIS — Z794 Long term (current) use of insulin: Secondary | ICD-10-CM | POA: Diagnosis not present

## 2021-09-05 DIAGNOSIS — Z7982 Long term (current) use of aspirin: Secondary | ICD-10-CM | POA: Diagnosis not present

## 2021-09-05 DIAGNOSIS — Z8673 Personal history of transient ischemic attack (TIA), and cerebral infarction without residual deficits: Secondary | ICD-10-CM | POA: Diagnosis not present

## 2021-09-05 DIAGNOSIS — G43909 Migraine, unspecified, not intractable, without status migrainosus: Secondary | ICD-10-CM | POA: Diagnosis not present

## 2021-09-05 DIAGNOSIS — Z981 Arthrodesis status: Secondary | ICD-10-CM | POA: Diagnosis not present

## 2021-09-05 DIAGNOSIS — I4891 Unspecified atrial fibrillation: Secondary | ICD-10-CM | POA: Diagnosis not present

## 2021-09-05 DIAGNOSIS — E1165 Type 2 diabetes mellitus with hyperglycemia: Secondary | ICD-10-CM | POA: Diagnosis not present

## 2021-09-05 DIAGNOSIS — I5032 Chronic diastolic (congestive) heart failure: Secondary | ICD-10-CM | POA: Diagnosis not present

## 2021-09-05 DIAGNOSIS — Z955 Presence of coronary angioplasty implant and graft: Secondary | ICD-10-CM | POA: Diagnosis not present

## 2021-09-05 DIAGNOSIS — I110 Hypertensive heart disease with heart failure: Secondary | ICD-10-CM | POA: Diagnosis not present

## 2021-09-05 DIAGNOSIS — Z96651 Presence of right artificial knee joint: Secondary | ICD-10-CM | POA: Diagnosis not present

## 2021-09-05 DIAGNOSIS — M5459 Other low back pain: Secondary | ICD-10-CM | POA: Diagnosis not present

## 2021-09-05 DIAGNOSIS — J439 Emphysema, unspecified: Secondary | ICD-10-CM | POA: Diagnosis not present

## 2021-09-05 DIAGNOSIS — M5136 Other intervertebral disc degeneration, lumbar region: Secondary | ICD-10-CM | POA: Diagnosis not present

## 2021-09-05 DIAGNOSIS — K760 Fatty (change of) liver, not elsewhere classified: Secondary | ICD-10-CM | POA: Diagnosis not present

## 2021-09-05 DIAGNOSIS — K8681 Exocrine pancreatic insufficiency: Secondary | ICD-10-CM | POA: Diagnosis not present

## 2021-09-05 DIAGNOSIS — R569 Unspecified convulsions: Secondary | ICD-10-CM | POA: Diagnosis not present

## 2021-09-05 DIAGNOSIS — Z79891 Long term (current) use of opiate analgesic: Secondary | ICD-10-CM | POA: Diagnosis not present

## 2021-09-05 DIAGNOSIS — I2510 Atherosclerotic heart disease of native coronary artery without angina pectoris: Secondary | ICD-10-CM | POA: Diagnosis not present

## 2021-09-05 DIAGNOSIS — Z471 Aftercare following joint replacement surgery: Secondary | ICD-10-CM | POA: Diagnosis not present

## 2021-09-05 DIAGNOSIS — K219 Gastro-esophageal reflux disease without esophagitis: Secondary | ICD-10-CM | POA: Diagnosis not present

## 2021-09-05 DIAGNOSIS — G4730 Sleep apnea, unspecified: Secondary | ICD-10-CM | POA: Diagnosis not present

## 2021-09-05 DIAGNOSIS — G8929 Other chronic pain: Secondary | ICD-10-CM | POA: Diagnosis not present

## 2021-09-08 DIAGNOSIS — M25661 Stiffness of right knee, not elsewhere classified: Secondary | ICD-10-CM | POA: Diagnosis not present

## 2021-09-08 DIAGNOSIS — M25561 Pain in right knee: Secondary | ICD-10-CM | POA: Diagnosis not present

## 2021-09-08 DIAGNOSIS — M6281 Muscle weakness (generalized): Secondary | ICD-10-CM | POA: Diagnosis not present

## 2021-09-08 DIAGNOSIS — Z96651 Presence of right artificial knee joint: Secondary | ICD-10-CM | POA: Diagnosis not present

## 2021-09-09 ENCOUNTER — Telehealth: Payer: Self-pay | Admitting: Nurse Practitioner

## 2021-09-09 NOTE — Telephone Encounter (Signed)
Requested medication (s) are due for refill today: NO  Requested medication (s) are on the active medication list: No  Last refill:  08/12/21 #10/0RF  Future visit scheduled: Yes  Notes to clinic:  Unable to refill per protocol, last refill by another provider. Was Dc'd on 08/26/21 by another provider.      Requested Prescriptions  Pending Prescriptions Disp Refills   sulfamethoxazole-trimethoprim (BACTRIM DS) 800-160 MG tablet [Pharmacy Med Name: Sulfamethoxazole-Trimethoprim 800-160 MG Oral Tablet] 10 tablet 0    Sig: Take 1 tablet by mouth twice daily     Off-Protocol Failed - 09/09/2021 11:22 AM      Failed - Medication not assigned to a protocol, review manually.      Passed - Valid encounter within last 12 months    Recent Outpatient Visits           4 weeks ago [place], Lauren A, NP   1 month ago Uncontrolled type 2 diabetes mellitus with hyperglycemia ([place])   [place], [place] T, NP   5 months ago Generalized abdominal pain   [place], [place] T, NP   6 months ago Uncontrolled type 2 diabetes mellitus with hyperglycemia ([place])   [place], Jolene T, NP   9 months ago Uncontrolled type 2 diabetes mellitus with hyperglycemia ([place])   [place], Barbaraann Faster, NP       Future Appointments             In 3 weeks End, Harrell Gave, MD [facility], [facility]   In 1 month [place], Barbaraann Faster, NP [employer], [facility]

## 2021-09-10 DIAGNOSIS — Z96651 Presence of right artificial knee joint: Secondary | ICD-10-CM | POA: Diagnosis not present

## 2021-09-10 DIAGNOSIS — M25561 Pain in right knee: Secondary | ICD-10-CM | POA: Diagnosis not present

## 2021-09-16 NOTE — Telephone Encounter (Signed)
Pt calling back to follow up on this medication request. Pt states he is broke out again and this is the only thing he has found that helps. Pt states he may come by the office tomorrow if he does not hear anything back.  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

## 2021-09-29 DIAGNOSIS — G894 Chronic pain syndrome: Secondary | ICD-10-CM | POA: Diagnosis not present

## 2021-09-29 DIAGNOSIS — M5136 Other intervertebral disc degeneration, lumbar region: Secondary | ICD-10-CM | POA: Diagnosis not present

## 2021-09-29 DIAGNOSIS — M545 Low back pain, unspecified: Secondary | ICD-10-CM | POA: Diagnosis not present

## 2021-10-02 ENCOUNTER — Ambulatory Visit: Payer: Medicare Other | Admitting: Internal Medicine

## 2021-10-02 ENCOUNTER — Other Ambulatory Visit: Payer: Self-pay

## 2021-10-02 ENCOUNTER — Encounter: Payer: Self-pay | Admitting: Internal Medicine

## 2021-10-02 ENCOUNTER — Telehealth: Payer: Self-pay | Admitting: Nurse Practitioner

## 2021-10-02 ENCOUNTER — Other Ambulatory Visit: Payer: Self-pay | Admitting: Internal Medicine

## 2021-10-02 VITALS — BP 120/80 | HR 82 | Ht 68.0 in | Wt 240.0 lb

## 2021-10-02 DIAGNOSIS — I152 Hypertension secondary to endocrine disorders: Secondary | ICD-10-CM

## 2021-10-02 DIAGNOSIS — E1159 Type 2 diabetes mellitus with other circulatory complications: Secondary | ICD-10-CM | POA: Diagnosis not present

## 2021-10-02 DIAGNOSIS — I25118 Atherosclerotic heart disease of native coronary artery with other forms of angina pectoris: Secondary | ICD-10-CM

## 2021-10-02 DIAGNOSIS — E1142 Type 2 diabetes mellitus with diabetic polyneuropathy: Secondary | ICD-10-CM

## 2021-10-02 DIAGNOSIS — E1165 Type 2 diabetes mellitus with hyperglycemia: Secondary | ICD-10-CM | POA: Diagnosis not present

## 2021-10-02 NOTE — Progress Notes (Signed)
Follow-up Outpatient Visit Date: 10/02/2021  Primary Care Provider: [name], NP [place] [id number]  Chief Complaint: Follow-up coronary artery disease  HPI:  Edward Schneider is a 65 y.o. male with history of coronary artery disease status post remote PTCA (details unknown) and more recent PCI to the mid LAD (11/20) hypertension, "mini stroke," liver cancer, GERD, and asthma, who presents for follow-up of coronary artery disease.  I last saw him in 03/2021, at which time he was feeling well from a heart standpoint.  He was trying to lose weight and improve his blood sugars so that he could move forward with his planned orthopedic surgeries.  Due to soft blood pressures, we agreed to decrease benazepril to 10 mg daily.  Today, Edward Schneider reports that he has been doing well from a heart standpoint.  He denies chest pain, shortness of breath, palpitations, lightheadedness, and edema.  Since our last visit, he has undergone right hip and knee arthroplasties and continues to have quite a bit of pain at both sites.  He is no longer participating in physical therapy, as the exercises were painful.  He is scheduled for follow-up with his orthopedist tomorrow.  --------------------------------------------------------------------------------------------------  Past Medical History:  Diagnosis Date   Allergy    Aortic atherosclerosis (HCC)    Asthma    C. difficile diarrhea    Chronic pain    Collagenous colitis    Coronary artery disease    a.) PCI with 2.75 x 18 mm Resolute Onyx DES x 1 to prox/mid LAD on 09/05/2019   DDD (degenerative disc disease), cervical    DDD (degenerative disc disease), lumbar    GERD (gastroesophageal reflux disease)    Grade I diastolic dysfunction    Hepatic steatosis    Hyperlipidemia    Hypertension    Liver cancer ([place]) 03/2015   Migraines    Myocardial infarction (HCC)     OSA on CPAP    Seizures ([place])    several as child when sick.  None  since age 56   Stroke [facility])    'mini-stroke" 30 yrs ago. no deficits.   T2DM (type 2 diabetes mellitus) ([place])    Wears dentures    full upper and lower   Past Surgical History:  Procedure Laterality Date   APPENDECTOMY     BACK SURGERY     CARDIAC CATHETERIZATION     No stent placed in his "29's"   CERVICAL FUSION     COLONOSCOPY WITH PROPOFOL N/A 03/06/2016   Procedure: COLONOSCOPY WITH PROPOFOL;  Surgeon: Lucilla Lame, MD;  Location: [place];  Service: Endoscopy;  Laterality: N/A;  requests early   COLONOSCOPY WITH PROPOFOL N/A 06/18/2020   Procedure: COLONOSCOPY WITH PROPOFOL;  Surgeon: Lucilla Lame, MD;  Location: [facility] ENDOSCOPY;  Service: Endoscopy;  Laterality: N/A;   ESOPHAGOGASTRODUODENOSCOPY (EGD) WITH PROPOFOL N/A 09/20/2017   Procedure: ESOPHAGOGASTRODUODENOSCOPY (EGD) WITH PROPOFOL;  Surgeon: Lucilla Lame, MD;  Location: [place];  Service: Endoscopy;  Laterality: N/A;  Diabetic - oral meds   FINGER SURGERY Left    INTRAVASCULAR PRESSURE WIRE/FFR STUDY N/A 09/05/2019   Procedure: INTRAVASCULAR PRESSURE WIRE/FFR STUDY;  Surgeon: Nelva Bush, MD;  Location: [place] CV LAB;  Service: Cardiovascular;  Laterality: N/A;   KNEE SURGERY Right    LEFT HEART CATH AND CORONARY ANGIOGRAPHY Left 09/05/2019   Procedure: LEFT HEART CATH AND CORONARY ANGIOGRAPHY (2.75 x 18 mm Resolute Onyx DES x 1 to prox/mid LAD);  Surgeon: Nelva Bush, MD;  Location: [place] CV [facility];  Service: Cardiovascular;  Laterality: Left;   NECK SURGERY     spleen surgery     TOE SURGERY Right    TOTAL HIP ARTHROPLASTY Right 06/03/2021   Procedure: TOTAL HIP ARTHROPLASTY ANTERIOR APPROACH;  Surgeon: Hessie Knows, MD;  Location: [facility];  Service: Orthopedics;  Laterality: Right;   TOTAL KNEE ARTHROPLASTY Right 08/22/2021   Procedure: TOTAL KNEE ARTHROPLASTY;  Surgeon: Hessie Knows, MD;  Location: [facility] OR[facility];  Service: Orthopedics;  Laterality: Right;     Current Meds   Medication Sig   acetaminophen (TYLENOL) 650 MG CR tablet Take 1,300 mg by mouth every 8 (eight) hours.   albuterol (VENTOLIN HFA) 108 (90 Base) MCG/ACT inhaler Inhale 2 puffs into the lungs every 6 (six) hours as needed for wheezing or shortness of breath.   Ascorbic Acid (VITAMIN C) 1000 MG tablet Take 1,000 mg by mouth daily.    aspirin EC 81 MG tablet Take 81 mg by mouth daily. Swallow whole.   atorvastatin (LIPITOR) 80 MG tablet TAKE 1 TABLET BY MOUTH AT BEDTIME   benazepril (LOTENSIN) 10 MG tablet Take 1 tablet by mouth once daily   calcium carbonate (OS-CAL) 600 MG TABS tablet Take 600 mg by mouth daily.    cetirizine (ZYRTEC) 10 MG tablet Take 10 mg by mouth daily.   Cholecalciferol (VITAMIN D3) 25 MCG (1000 UT) CHEW Chew 1,000 Units by mouth daily.    CINNAMON PO Take 1,000 mg by mouth 2 (two) times daily.    clobetasol cream (TEMOVATE) 2.33 % Apply 1 application topically 2 (two) times daily.   Continuous Blood Gluc Sensor (FREESTYLE LIBRE 2 SENSOR) MISC Use as instructed to check blood sugar daily   cyclobenzaprine (FLEXERIL) 10 MG tablet Take 1 tablet (10 mg total) by mouth 3 (three) times daily as needed for muscle spasms.   dapagliflozin propanediol (FARXIGA) 10 MG TABS tablet Take 1 tablet (10 mg total) by mouth daily before breakfast.   diclofenac Sodium (VOLTAREN) 1 % GEL Apply 2 g topically 4 (four) times daily as needed (pain).    diphenhydrAMINE (BENADRYL) 25 mg capsule Take 50 mg by mouth in the morning, at noon, and at bedtime.   docusate sodium (COLACE) 100 MG capsule Take 1 capsule (100 mg total) by mouth 2 (two) times daily.   doxepin (SINEQUAN) 25 MG capsule Take 50 mg by mouth at bedtime.    Dulaglutide (TRULICITY) 3 AQ/7.6AU SOPN Inject 3 mg as directed once a week.   DULoxetine (CYMBALTA) 30 MG capsule Take 30 mg by mouth 2 (two) times daily with breakfast and lunch.   DULoxetine (CYMBALTA) 60 MG capsule Take 1 capsule (60 mg total) by mouth at bedtime.    enoxaparin (LOVENOX) 40 MG/0.4ML injection Inject 0.4 mLs (40 mg total) into the skin daily for 14 days.   ezetimibe (ZETIA) 10 MG tablet Take 1 tablet by mouth once daily   Ginger, Zingiber officinalis, (GINGER PO) Take 1 tablet by mouth daily.   Ginseng 100 MG CAPS Take 100 mg by mouth daily.    Insulin Glargine (BASAGLAR KWIKPEN) 100 UNIT/ML Inject 32 Units into the skin at bedtime.   insulin lispro (HUMALOG KWIKPEN) 200 UNIT/ML KwikPen Inject 16-22 Units into the skin See admin instructions. Injecting 22 units with Breakfast, 16 units with lunch and supper   isosorbide mononitrate (IMDUR) 60 MG 24 hr tablet Take 1 tablet (60 mg total) by mouth daily.   Multiple Vitamins-Minerals (  CENTRUM SILVER 50+MEN) TABS Take 1 tablet by mouth daily.   mupirocin ointment (BACTROBAN) 2 % Apply 1 application topically daily as needed.   NARCAN 4 MG/0.1ML LIQD nasal spray kit Place 0.4 mg into the nose as needed (opioid overdose).    nitroGLYCERIN (NITROSTAT) 0.4 MG SL tablet Place 0.4 mg under the tongue every 5 (five) minutes as needed for chest pain.    Omega-3 1000 MG CAPS Take 1,000 mg by mouth daily.    oxyCODONE 10 MG TABS Take 1-1.5 tablets (10-15 mg total) by mouth every 3 (three) hours as needed for severe pain (pain score 7-10).   pantoprazole (PROTONIX) 40 MG tablet Take 1 tablet by mouth twice daily   pregabalin (LYRICA) 50 MG capsule TAKE 1 CAPSULE BY MOUTH THREE TIMES DAILY   PRESCRIPTION MEDICATION CPAP   senna-docusate (SENOKOT-S) 8.6-50 MG tablet Take 1 tablet by mouth at bedtime as needed for mild constipation.   sucralfate (CARAFATE) 1 g tablet TAKE 1 TABLET BY MOUTH 4 TIMES DAILY WITH MEALS AND AT BEDTIME   Tiotropium Bromide-Olodaterol (STIOLTO RESPIMAT) 2.5-2.5 MCG/ACT AERS Inhale 2 puffs into the lungs daily as needed (shortness of breath).   traMADol (ULTRAM) 50 MG tablet Take 1 tablet (50 mg total) by mouth every 6 (six) hours as needed.   traZODone (DESYREL) 100 MG tablet TAKE 1  TABLET BY MOUTH AT BEDTIME AS NEEDED FOR SLEEP   vitamin A 10000 UNIT capsule Take 10,000 Units by mouth daily.   vitamin B-12 (CYANOCOBALAMIN) 1000 MCG tablet Take 1,000 mcg by mouth daily.   Vitamin D, Ergocalciferol, (DRISDOL) 1.25 MG (50000 UNIT) CAPS capsule Take 1 capsule (50,000 Units total) by mouth every 7 (seven) days.   Vitamin E 400 units TABS Take 400 Units by mouth daily.     Allergies: Bee venom, Crestor [rosuvastatin calcium], Fentanyl, Gabapentin, Shellfish allergy, Furosemide, Buprenorphine hcl, Chlorhexidine gluconate, and Simvastatin  Social History   Tobacco Use   Smoking status: Former    Packs/day: 2.00    Years: 50.00    Pack years: 100.00    Types: Cigarettes    Quit date: 2011    Years since quitting: 11.9   Smokeless tobacco: Never  Vaping Use   Vaping Use: Never used  Substance Use Topics   Alcohol use: No    Alcohol/week: 0.0 standard drinks   Drug use: No    Family History  Problem Relation Age of Onset   Arthritis Mother    Diabetes Mother    Kidney disease Mother    Heart disease Mother    Hypertension Mother    Arthritis Father    Hearing loss Father    Hypertension Father    Heart disease Father    Diabetes Sister    Heart disease Sister    Diabetes Daughter    Diabetes Maternal Aunt    Diabetes Maternal Grandmother    Heart Problems Brother    Heart Problems Brother    Heart Problems Brother    Heart attack Maternal Grandfather    Colon cancer Paternal Grandfather     Review of Systems: A 12-system review of systems was performed and was negative except as noted in the HPI.  --------------------------------------------------------------------------------------------------  Physical Exam: BP 120/80 (BP Location: Left Arm, Patient Position: Sitting, Cuff Size: Large)    Pulse 82    Ht _0  (1.727 m)    Wt 240 lb (108.9 kg)    SpO2 98%    BMI 36.49 kg/m  General:  NAD. Neck: No JVD or HJR. Lungs: Clear to auscultation  bilaterally without wheezes or crackles. Heart: Regular rate and rhythm without murmurs, rubs, or gallops. Abdomen: Soft, nontender, nondistended. Extremities: No lower extremity edema.  EKG:  NSR w/o abnormality.  Lab Results  Component Value Date   WBC 13.8 (H) 08/31/2021   HGB 10.5 (L) 08/31/2021   HCT 34.0 (L) 08/31/2021   MCV 84.8 08/31/2021   PLT 417 (H) 08/31/2021    Lab Results  Component Value Date   NA 132 (L) 08/31/2021   K 4.4 08/31/2021   CL 101 08/31/2021   CO2 20 (L) 08/31/2021   BUN 19 08/31/2021   CREATININE 1.06 08/31/2021   GLUCOSE 245 (H) 08/31/2021   ALT 25 08/31/2021    Lab Results  Component Value Date   CHOL 102 08/26/2021   HDL 46 08/26/2021   LDLCALC 35 08/26/2021   TRIG 103 08/26/2021   CHOLHDL 2.2 08/26/2021    --------------------------------------------------------------------------------------------------  ASSESSMENT AND PLAN: Coronary artery disease with stable angina: No chest pain or dyspnea reported.  We will continue current medications, including isosorbide mononitrate, aspirin, and atorvastatin, for antianginal therapy and secondary prevention.  Hypertension: Blood pressure reasonable today with less hypotensive episodes as Edward Schneider recovers from his orthopedic surgeries and following de-escalation of benazepril at our last visit.  Defer medication changes today.  Hyperlipidemia associated with type 2 diabetes mellitus: Lipids well controlled on last check in November.  Continue atorvastatin 80 mg daily.  Ongoing management of diabetes mellitus per Ms. Cannady.  Morbid obesity: BMI remains greater than 35 with multiple comorbidities including high diabetes mellitus, coronary artery disease, and hypertension.  Weight loss encouraged through diet and exercise.  Follow-up: Return to clinic in 6 months.  Nelva Bush, MD 10/04/2021 1:31 PM

## 2021-10-02 NOTE — Patient Instructions (Signed)
Medication Instructions:   Your physician recommends that you continue on your current medications as directed. Please refer to the Current Medication list given to you today.  *If you need a refill on your cardiac medications before your next appointment, please call your pharmacy*   Lab Work:  None ordered  Testing/Procedures:  None ordered   Follow-Up: At [facility], you and your health needs are our priority.  As part of our continuing mission to provide you with exceptional heart care, we have created designated Provider Care Teams.  These Care Teams include your primary Cardiologist (physician) and Advanced Practice Providers (APPs -  Physician Assistants and Nurse Practitioners) who all work together to provide you with the care you need, when you need it.  We recommend signing up for the patient portal called "MyChart".  Sign up information is provided on this After Visit Summary.  MyChart is used to connect with patients for Virtual Visits (Telemedicine).  Patients are able to view lab/test results, encounter notes, upcoming appointments, etc.  Non-urgent messages can be sent to your provider as well.   To learn more about what you can do with MyChart, go to [email or web address].    Your next appointment:   6 month(s)  The format for your next appointment:   In Person  Provider:   You may see Nelva Bush, MD or one of the following Advanced Practice Providers on your designated Care Team:   Murray Hodgkins, NP Christell Faith, PA-C Cadence Kathlen Mody, [place]

## 2021-10-03 DIAGNOSIS — Z96651 Presence of right artificial knee joint: Secondary | ICD-10-CM | POA: Diagnosis not present

## 2021-10-03 NOTE — Telephone Encounter (Signed)
Requested Prescriptions  Pending Prescriptions Disp Refills   DULoxetine (CYMBALTA) 60 MG capsule [Pharmacy Med Name: DULoxetine HCl 60 MG Oral Capsule Delayed Release Particles] 90 capsule 0    Sig: Take 1 capsule by mouth at bedtime     Psychiatry: Antidepressants - SNRI Passed - 10/02/2021 11:21 AM      Passed - Completed PHQ-2 or PHQ-9 in the last 360 days      Passed - Last BP in normal range    BP Readings from Last 1 Encounters:  10/02/21 120/80         Passed - Valid encounter within last 6 months    Recent Outpatient Visits          1 month ago [place], Lauren A, NP   2 months ago Uncontrolled type 2 diabetes mellitus with hyperglycemia ([place])   [place], Rushford T, NP   6 months ago Generalized abdominal pain   [place], Harper T, NP   7 months ago Uncontrolled type 2 diabetes mellitus with hyperglycemia ([place])   [place] Cannady, Jolene T, NP   10 months ago Uncontrolled type 2 diabetes mellitus with hyperglycemia ([place])   [place], Barbaraann Faster, NP      Future Appointments            In 2 weeks Cannady, Barbaraann Faster, NP [employer]   In 6 months [place], Harrell Gave, MD [facility], [place]

## 2021-10-04 ENCOUNTER — Encounter: Payer: Self-pay | Admitting: Internal Medicine

## 2021-10-06 MED ORDER — DULOXETINE HCL 60 MG PO CPEP
60.0000 mg | ORAL_CAPSULE | Freq: Every day | ORAL | 4 refills | Status: DC
Start: 2021-10-06 — End: 2022-04-17

## 2021-10-06 NOTE — Addendum Note (Signed)
Addended by: Marnee Guarneri T on: 10/06/2021 03:56 PM   Modules accepted: Orders

## 2021-10-06 NOTE — Telephone Encounter (Signed)
Pt called stating that he is completely out of this medication. He states that he took his last one yesterday. Please advise.

## 2021-10-06 NOTE — Telephone Encounter (Signed)
Left a message notifying patient that his prescription was sent over per Jolene. Advised to give our office a call back if he has any questions.

## 2021-10-17 ENCOUNTER — Other Ambulatory Visit: Payer: Self-pay

## 2021-10-17 ENCOUNTER — Encounter: Payer: Self-pay | Admitting: Nurse Practitioner

## 2021-10-17 ENCOUNTER — Ambulatory Visit (INDEPENDENT_AMBULATORY_CARE_PROVIDER_SITE_OTHER): Payer: Medicare Other | Admitting: Nurse Practitioner

## 2021-10-17 VITALS — BP 106/69 | HR 90 | Temp 98.5°F | Ht 68.0 in | Wt 240.6 lb

## 2021-10-17 DIAGNOSIS — I25118 Atherosclerotic heart disease of native coronary artery with other forms of angina pectoris: Secondary | ICD-10-CM | POA: Diagnosis not present

## 2021-10-17 DIAGNOSIS — J432 Centrilobular emphysema: Secondary | ICD-10-CM | POA: Diagnosis not present

## 2021-10-17 DIAGNOSIS — F321 Major depressive disorder, single episode, moderate: Secondary | ICD-10-CM

## 2021-10-17 DIAGNOSIS — E1169 Type 2 diabetes mellitus with other specified complication: Secondary | ICD-10-CM | POA: Diagnosis not present

## 2021-10-17 DIAGNOSIS — I152 Hypertension secondary to endocrine disorders: Secondary | ICD-10-CM

## 2021-10-17 DIAGNOSIS — E1142 Type 2 diabetes mellitus with diabetic polyneuropathy: Secondary | ICD-10-CM

## 2021-10-17 DIAGNOSIS — D508 Other iron deficiency anemias: Secondary | ICD-10-CM | POA: Diagnosis not present

## 2021-10-17 DIAGNOSIS — E1165 Type 2 diabetes mellitus with hyperglycemia: Secondary | ICD-10-CM

## 2021-10-17 DIAGNOSIS — E785 Hyperlipidemia, unspecified: Secondary | ICD-10-CM

## 2021-10-17 DIAGNOSIS — I4891 Unspecified atrial fibrillation: Secondary | ICD-10-CM | POA: Diagnosis not present

## 2021-10-17 DIAGNOSIS — E1159 Type 2 diabetes mellitus with other circulatory complications: Secondary | ICD-10-CM | POA: Diagnosis not present

## 2021-10-17 DIAGNOSIS — E559 Vitamin D deficiency, unspecified: Secondary | ICD-10-CM | POA: Diagnosis not present

## 2021-10-17 DIAGNOSIS — D692 Other nonthrombocytopenic purpura: Secondary | ICD-10-CM | POA: Diagnosis not present

## 2021-10-17 DIAGNOSIS — N400 Enlarged prostate without lower urinary tract symptoms: Secondary | ICD-10-CM

## 2021-10-17 MED ORDER — SULFAMETHOXAZOLE-TRIMETHOPRIM 800-160 MG PO TABS: 1.0000 | ORAL_TABLET | Freq: Two times a day (BID) | ORAL | 0 refills | Status: AC

## 2021-10-17 NOTE — Progress Notes (Signed)
BP 106/69    Pulse 90    Temp 98.5 F (36.9 C) (Oral)    Ht 5\' 8"  (1.727 m)    Wt 240 lb 9.6 oz (109.1 kg)    SpO2 96%    BMI 36.58 kg/m    Subjective:    Patient ID: Edward Flesher., male    DOB: 22-Mar-1956, 65 y.o.   MRN: [id number]  HPI: Edward Schneider. is a 65 y.o. male  Chief Complaint  Patient presents with   Diabetes   Hyperlipidemia   Hypertension   Mood   COPD   Pain   Med Change Request    Patient state he is requesting medication for the skin conditions. Patient states he was once on the prescription before and would like to have it more than 5 days this time around.    Requesting another round of Bactrim, which was given recently for Impetigo, he reports this cleared it right up and is having another outbreak after surgery.  DIABETES Sees endocrinology and last saw 09/04/21 with A1C 8.3% Dr. Kelton Pillar -- Trulicity and Wilder Glade increased. Taking Trulicity 3 MG weekly and Farxiga 10 MG.  Has been working on weight loss. Hypoglycemic episodes:no Polydipsia/polyuria: no Visual disturbance: no Chest pain: no Paresthesias: no Glucose Monitoring: yes             Accucheck frequency: QID             Fasting glucose: 96 to 103             Post prandial:              Evening:              Before meals:  Taking Insulin?: yes             Long acting insulin: Basaglar 30 units             Short acting insulin: Humalog 22 units with breakfast and then 16 with lunch and supper Blood Pressure Monitoring: daily Retinal Examination: Up to Date Foot Exam: Up to Date Pneumovax: Up to Date Influenza: Up to Date Aspirin: yes    HYPERTENSION / HYPERLIPIDEMIA Followed by cardiology and last saw 03/26/21 -- decreased Benazepril to 10 MG.  Last echo 08/26/21 = EF 60-65% with normal left ventricular diastolic parameters.  Continues Atorvastatin 80 MG + Zetia.  Takes Benazepril 10 MG, Doxepin 25 MG, Imdur, NTG for heart.   Satisfied with current treatment? yes Duration of  hypertension: chronic BP monitoring frequency: daily BP range: 120/80 on average BP medication side effects: no Duration of hyperlipidemia: chronic Cholesterol medication side effects: no Cholesterol supplements: none Medication compliance: good compliance Aspirin: yes Recent stressors: no Recurrent headaches: no Visual changes: no Palpitations: no Dyspnea: at baseline, no worsening Chest pain: none recent Lower extremity edema: no Dizzy/lightheaded: no  The ASCVD Risk score (Arnett DK, et al., 2019) failed to calculate for the following reasons:   The patient has a prior MI or stroke diagnosis  COPD Continues Stiolto and Albuterol -- reports minimal to no use Albuterol with weight loss. Does use CPAP 100% of the time. Smoked for 48 years, was 6-7 when started.  Smoked 3 to 3 1/3 PPD.  Quit in 2012.    Goes for annual lung screening, last went July 2021. Last saw pulmonary on 06/17/21. COPD status: stable Satisfied with current treatment?: yes Oxygen use: no Dyspnea frequency: at baseline Cough frequency: minimal Rescue inhaler  frequency:  minimal Limitation of activity: no Productive cough: none Last Spirometry: with pulmonary Pneumovax: Up to Date Influenza: Up to Date    CHRONIC PAIN Pain medicine provider, Dr. Primus Bravo and returns next Monday. Takes Oxycodone as needed + Lyrica, Duloxetine.  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 Rudene Christians and then right knee on 08/22/21.  Post op some low hemoglobin levels noted.  Denies symptoms. Duration: chronic Mechanism of injury: unknown Location: low back Onset: gradual Severity: 6/10 Quality: sharp and aching Frequency: intermittent Radiation: multiple areas Aggravating factors: lifting, movement, walking and bending Alleviating factors: nothing Status: worse Treatments attempted: Oxycodone & Lyrica Relief with NSAIDs?: No NSAIDs Taken Nighttime pain:  at times Paresthesias / decreased sensation:  yes Bowel /  bladder incontinence:  no Fevers:  no Dysuria / urinary frequency:  no  DEPRESSION Continues on Duloxetine 60 MG daily and Trazodone.  Mood status: stable Satisfied with current treatment?: yes Symptom severity: moderate  Duration of current treatment : chronic Side effects: no Medication compliance: good compliance Psychotherapy/counseling: none Depressed mood: yes Anxious mood: no Anhedonia: no Significant weight loss or gain: no Insomnia: yes hard to fall asleep Fatigue: no Feelings of worthlessness or guilt: yes Impaired concentration/indecisiveness: yes Suicidal ideations: as above Hopelessness: no Crying spells: no Depression screen [facility] 2/9 10/17/2021 08/12/2021 07/18/2021 07/05/2021 04/02/2021  Decreased Interest 0 2 0 0 0  Down, Depressed, Hopeless - 0 0 1 3  PHQ - 2 Score 0 2 0 1 3  Altered sleeping 3 1 0 - 3  Tired, decreased energy 2 2 3  - 3  Change in appetite 1 2 0 - 0  Feeling bad or failure about yourself  0 0 0 - 0  Trouble concentrating 0 0 0 - 0  Moving slowly or fidgety/restless 0 0 0 - 1  Suicidal thoughts 0 0 0 - 0  PHQ-9 Score 6 7 3  - 10  Difficult doing work/chores Not difficult at all Somewhat difficult Not difficult at all - Somewhat difficult  Some recent data might be hidden   Relevant past medical, surgical, family and social history reviewed and updated as indicated. Interim medical history since our last visit reviewed. Allergies and medications reviewed and updated.  Review of Systems  Constitutional: Negative.   Respiratory:  Negative for apnea, cough, chest tightness, shortness of breath and wheezing.   Cardiovascular:  Negative for chest pain, palpitations and leg swelling.  Gastrointestinal: Negative.   Endocrine: Negative.   Neurological: Negative.   Psychiatric/Behavioral: Negative.     Per HPI unless specifically indicated above     Objective:    BP 106/69    Pulse 90    Temp 98.5 F (36.9 C) (Oral)    Ht 5\' 8"  (1.727 m)     Wt 240 lb 9.6 oz (109.1 kg)    SpO2 96%    BMI 36.58 kg/m   Wt Readings from Last 3 Encounters:  10/17/21 240 lb 9.6 oz (109.1 kg)  10/02/21 240 lb (108.9 kg)  09/04/21 230 lb (104.3 kg)    Physical Exam Vitals and nursing note reviewed.  Constitutional:      General: He is awake. He is not in acute distress.    Appearance: He is well-developed and well-groomed. He is obese. He is not ill-appearing or toxic-appearing.  HENT:     Head: Normocephalic and atraumatic.     Right Ear: Hearing normal. No drainage.     Left Ear: Hearing normal. No drainage.  Eyes:     General: Lids are normal.        Right eye: No discharge.        Left eye: No discharge.     Conjunctiva/sclera: Conjunctivae normal.     Pupils: Pupils are equal, round, and reactive to light.  Neck:     Thyroid: No thyromegaly.     Vascular: No carotid bruit.     Trachea: Trachea normal.  Cardiovascular:     Rate and Rhythm: Normal rate and regular rhythm.     Heart sounds: Normal heart sounds, S1 normal and S2 normal. No murmur heard.   No gallop.  Pulmonary:     Effort: Pulmonary effort is normal. No accessory muscle usage or respiratory distress.     Breath sounds: Normal breath sounds.  Abdominal:     General: Bowel sounds are normal.     Palpations: Abdomen is soft.  Musculoskeletal:        General: Normal range of motion.     Cervical back: Normal range of motion and neck supple.     Right lower leg: No edema.     Left lower leg: No edema.  Skin:    General: Skin is warm and dry.     Capillary Refill: Capillary refill takes less than 2 seconds.     Findings: Lesion present. No rash.     Comments: Scattered pale bruising to upper extremities and abrasions -- baseline.    Neurological:     Mental Status: He is alert and oriented to person, place, and time.     Motor: Motor function is intact.     Coordination: Coordination is intact.     Gait: Gait is intact.     Deep Tendon Reflexes: Reflexes are normal  and symmetric.  Psychiatric:        Attention and Perception: Attention normal.        Mood and Affect: Mood normal.        Behavior: Behavior normal. Behavior is cooperative.        Thought Content: Thought content normal.   Diabetic Foot Exam - Simple   No data filed      Results for orders placed or performed during the hospital encounter of 08/31/21  CBC with Differential  Result Value Ref Range   WBC 13.8 (H) 4.0 - 10.5 K/uL   RBC 4.01 (L) 4.22 - 5.81 MIL/uL   Hemoglobin 10.5 (L) 13.0 - 17.0 g/dL   HCT 34.0 (L) 39.0 - 52.0 %   MCV 84.8 80.0 - 100.0 fL   MCH 26.2 26.0 - 34.0 pg   MCHC 30.9 30.0 - 36.0 g/dL   RDW 15.2 11.5 - 15.5 %   Platelets 417 (H) 150 - 400 K/uL   nRBC 0.1 0.0 - 0.2 %   Neutrophils Relative % 65 %   Neutro Abs 9.1 (H) 1.7 - 7.7 K/uL   Lymphocytes Relative 22 %   Lymphs Abs 3.0 0.7 - 4.0 K/uL   Monocytes Relative 8 %   Monocytes Absolute 1.1 (H) 0.1 - 1.0 K/uL   Eosinophils Relative 3 %   Eosinophils Absolute 0.4 0.0 - 0.5 K/uL   Basophils Relative 1 %   Basophils Absolute 0.2 (H) 0.0 - 0.1 K/uL   Immature Granulocytes 1 %   Abs Immature Granulocytes 0.07 0.00 - 0.07 K/uL  Comprehensive metabolic panel  Result Value Ref Range   Sodium 132 (L) 135 - 145 mmol/L   Potassium 4.4 3.5 -  5.1 mmol/L   Chloride 101 98 - 111 mmol/L   CO2 20 (L) 22 - 32 mmol/L   Glucose, Bld 245 (H) 70 - 99 mg/dL   BUN 19 8 - 23 mg/dL   Creatinine, Ser 1.06 0.61 - 1.24 mg/dL   Calcium 9.7 8.9 - 10.3 mg/dL   Total Protein 7.7 6.5 - 8.1 g/dL   Albumin 3.6 3.5 - 5.0 g/dL   AST 28 15 - 41 U/L   ALT 25 0 - 44 U/L   Alkaline Phosphatase 66 38 - 126 U/L   Total Bilirubin 0.7 0.3 - 1.2 mg/dL   GFR, Estimated >60 >60 mL/min   Anion gap 11 5 - 15      Assessment & Plan:   Problem List Items Addressed This Visit       Cardiovascular and Mediastinum   Atrial fibrillation with rapid ventricular response (HCC)   Relevant Orders   Comprehensive metabolic panel   Coronary  artery disease of native artery of native heart with stable angina pectoris ([place])    Followed by cardiology.  Continue current medication regimen and collaboration with cardiology.        Relevant Medications   DULoxetine (CYMBALTA) 30 MG capsule   Hypertension associated with diabetes ([place])    Chronic, stable with BP at goal.  Continue current medication regimen and adjust as needed + continue collaboration with cardiology team.  Recommend he continue to monitor BP closely at home and document + bring levels to visit.  DASH diet at home.  CMP and CBC today.        Relevant Orders   Comprehensive metabolic panel   Senile purpura ([place])    As evidenced by bruising bilateral upper extremities and ASA use.  Recommend gentle skin care at home and monitor for skin breakdown, if presents immediately notify provider.        Respiratory   Emphysema lung (HCC)    Chronic, ongoing.  Continue current inhaler regimen, Stiolto through assistance program received and Albuterol.  Continue collaboration with pulmonary.  Recommend focus on modest weight loss.          Endocrine   DM type 2 with diabetic peripheral neuropathy (HCC)    Chronic, ongoing will continue to collaborate with endo.  A1C 8.3% with endo, will defer recheck to endo.  Continue Duloxetine and collaboration with pain management + collaboration with endocrinology and current medication regimen. + collaboration with CCM team.  Recommend continue to monitor BS QID with [name] and focus heavily on diet and modest weight loss.  Bring Libre to visits.        Relevant Medications   DULoxetine (CYMBALTA) 30 MG capsule   Hyperlipidemia associated with type 2 diabetes mellitus (HCC)    Chronic, ongoing.  Continue current medication regimen and adjust as needed.  Lipid panel today.       Relevant Orders   Comprehensive metabolic panel   Lipid Panel w/o Chol/HDL Ratio   Uncontrolled type 2 diabetes mellitus with hyperglycemia (HCC) -  Primary    Chronic, ongoing will continue to collaborate with endo.  A1C 8.3% recently with endo.  Urine ALB 15 January 2021, continue Benazepril for kidney protection.  Continue collaboration with endocrinology and current medication regimen.  Continue collaboration with CCM team.  Recommend continue to monitor BS QID with [name] and focus heavily on diet and modest weight loss.  Bring Libre to visits.  Return in 6 months.  Genitourinary   BPH (benign prostatic hyperplasia)    PSA check on labs today.      Relevant Orders   PSA     Other   Depression, major, single episode, moderate (HCC)    Chronic, ongoing secondary to chronic diseases and pain.  Continue Duloxetine, which benefits mood and pain, plus Trazodone for sleep.  Refuses psychiatry or therapy referral.  Will continue current regimen and adjust as needed.  If SI presents he is to immediately go to ER, which he agrees with.  Return to office in 6 months, sooner if worsening mood.      Relevant Medications   DULoxetine (CYMBALTA) 30 MG capsule   Morbid obesity (HCC)    BMI 36.58 with HTN, CAD, T2DM.  Praised for continue weight loss.  Recommended eating smaller high protein, low fat meals more frequently and exercising 30 mins a day 5 times a week with a goal of 10-15lb weight loss in the next 3 months. Patient voiced their understanding and motivation to adhere to these recommendations.       Other Visit Diagnoses     Other iron deficiency anemia       Recheck CBC and iron/ferritin post-op.   Relevant Orders   CBC with Differential/Platelet   Iron Binding Cap (TIBC)([facility])   Ferritin   Vitamin D deficiency       History of low levels reported, recheck today and initiate supplement as needed.   Relevant Orders   VITAMIN D 25 Hydroxy (Vit-D Deficiency, Fractures)        Follow up plan: Return in about 6 months (around 04/17/2022) for Annual physical.

## 2021-10-17 NOTE — Assessment & Plan Note (Signed)
Chronic, ongoing will continue to collaborate with endo.  A1C 8.3% recently with endo.  Urine ALB 15 January 2021, continue Benazepril for kidney protection.  Continue collaboration with endocrinology and current medication regimen.  Continue collaboration with CCM team.  Recommend continue to monitor BS QID with [name] and focus heavily on diet and modest weight loss.  Bring Libre to visits.  Return in 6 months.

## 2021-10-17 NOTE — Assessment & Plan Note (Signed)
Chronic, ongoing.  Continue current medication regimen and adjust as needed. Lipid panel today. 

## 2021-10-17 NOTE — Assessment & Plan Note (Signed)
As evidenced by bruising bilateral upper extremities and ASA use.  Recommend gentle skin care at home and monitor for skin breakdown, if presents immediately notify provider.

## 2021-10-17 NOTE — Patient Instructions (Signed)

## 2021-10-17 NOTE — Assessment & Plan Note (Signed)
Followed by cardiology.  Continue current medication regimen and collaboration with cardiology.

## 2021-10-17 NOTE — Assessment & Plan Note (Signed)
BMI 36.58 with HTN, CAD, T2DM.  Praised for continue weight loss.  Recommended eating smaller high protein, low fat meals more frequently and exercising 30 mins a day 5 times a week with a goal of 10-15lb weight loss in the next 3 months. Patient voiced their understanding and motivation to adhere to these recommendations.

## 2021-10-17 NOTE — Assessment & Plan Note (Signed)
PSA check on labs today.

## 2021-10-17 NOTE — Assessment & Plan Note (Addendum)
Chronic, stable with BP at goal.  Continue current medication regimen and adjust as needed + continue collaboration with cardiology team.  Recommend he continue to monitor BP closely at home and document + bring levels to visit.  DASH diet at home.  CMP and CBC today.

## 2021-10-17 NOTE — Assessment & Plan Note (Signed)
Chronic, ongoing.  Continue current inhaler regimen, Stiolto through assistance program received and Albuterol.  Continue collaboration with pulmonary.  Recommend focus on modest weight loss.

## 2021-10-17 NOTE — Assessment & Plan Note (Signed)
Chronic, ongoing secondary to chronic diseases and pain.  Continue Duloxetine, which benefits mood and pain, plus Trazodone for sleep.  Refuses psychiatry or therapy referral.  Will continue current regimen and adjust as needed.  If SI presents he is to immediately go to ER, which he agrees with.  Return to office in 6 months, sooner if worsening mood.

## 2021-10-17 NOTE — Assessment & Plan Note (Signed)
Chronic, ongoing will continue to collaborate with endo.  A1C 8.3% with endo, will defer recheck to endo.  Continue Duloxetine and collaboration with pain management + collaboration with endocrinology and current medication regimen. + collaboration with CCM team.  Recommend continue to monitor BS QID with [name] and focus heavily on diet and modest weight loss.  Bring Libre to visits.

## 2021-10-18 ENCOUNTER — Other Ambulatory Visit: Payer: Self-pay | Admitting: Nurse Practitioner

## 2021-10-18 DIAGNOSIS — E875 Hyperkalemia: Secondary | ICD-10-CM

## 2021-10-18 DIAGNOSIS — D649 Anemia, unspecified: Secondary | ICD-10-CM

## 2021-10-18 LAB — CBC WITH DIFFERENTIAL/PLATELET
Basophils Absolute: 0.1 10*3/uL (ref 0.0–0.2)
Basos: 1 %
EOS (ABSOLUTE): 0.2 10*3/uL (ref 0.0–0.4)
Eos: 2 %
Hematocrit: 36.6 % — ABNORMAL LOW (ref 37.5–51.0)
Hemoglobin: 11.4 g/dL — ABNORMAL LOW (ref 13.0–17.7)
Immature Grans (Abs): 0 10*3/uL (ref 0.0–0.1)
Immature Granulocytes: 0 %
Lymphocytes Absolute: 2.9 10*3/uL (ref 0.7–3.1)
Lymphs: 30 %
MCH: 25.2 pg — ABNORMAL LOW (ref 26.6–33.0)
MCHC: 31.1 g/dL — ABNORMAL LOW (ref 31.5–35.7)
MCV: 81 fL (ref 79–97)
Monocytes Absolute: 0.8 10*3/uL (ref 0.1–0.9)
Monocytes: 8 %
Neutrophils Absolute: 5.8 10*3/uL (ref 1.4–7.0)
Neutrophils: 59 %
Platelets: 237 10*3/uL (ref 150–450)
RBC: 4.53 x10E6/uL (ref 4.14–5.80)
RDW: 14.6 % (ref 11.6–15.4)
WBC: 9.8 10*3/uL (ref 3.4–10.8)

## 2021-10-18 LAB — COMPREHENSIVE METABOLIC PANEL
ALT: 23 IU/L (ref 0–44)
AST: 27 IU/L (ref 0–40)
Albumin/Globulin Ratio: 1.7 (ref 1.2–2.2)
Albumin: 4.4 g/dL (ref 3.8–4.8)
Alkaline Phosphatase: 87 IU/L (ref 44–121)
BUN/Creatinine Ratio: 23 (ref 10–24)
BUN: 26 mg/dL (ref 8–27)
Bilirubin Total: 0.5 mg/dL (ref 0.0–1.2)
CO2: 22 mmol/L (ref 20–29)
Calcium: 10 mg/dL (ref 8.6–10.2)
Chloride: 100 mmol/L (ref 96–106)
Creatinine, Ser: 1.14 mg/dL (ref 0.76–1.27)
Globulin, Total: 2.6 g/dL (ref 1.5–4.5)
Glucose: 272 mg/dL — ABNORMAL HIGH (ref 70–99)
Potassium: 5.4 mmol/L — ABNORMAL HIGH (ref 3.5–5.2)
Sodium: 137 mmol/L (ref 134–144)
Total Protein: 7 g/dL (ref 6.0–8.5)
eGFR: 72 mL/min/{1.73_m2} (ref >59)

## 2021-10-18 LAB — IRON AND TIBC
Iron Saturation: 11 % — ABNORMAL LOW (ref 15–55)
Iron: 47 ug/dL (ref 38–169)
Total Iron Binding Capacity: 419 ug/dL (ref 250–450)
UIBC: 372 ug/dL — ABNORMAL HIGH (ref 111–343)

## 2021-10-18 LAB — LIPID PANEL W/O CHOL/HDL RATIO
Cholesterol, Total: 117 mg/dL (ref 100–199)
HDL: 52 mg/dL (ref >39)
LDL Chol Calc (NIH): 38 mg/dL (ref 0–99)
Triglycerides: 165 mg/dL — ABNORMAL HIGH (ref 0–149)
VLDL Cholesterol Cal: 27 mg/dL (ref 5–40)

## 2021-10-18 LAB — VITAMIN D 25 HYDROXY (VIT D DEFICIENCY, FRACTURES): Vit D, 25-Hydroxy: 40.7 ng/mL (ref 30.0–100.0)

## 2021-10-18 LAB — PSA: Prostate Specific Ag, Serum: 0.4 ng/mL (ref 0.0–4.0)

## 2021-10-18 LAB — FERRITIN: Ferritin: 24 ng/mL — ABNORMAL LOW (ref 30–400)

## 2021-10-18 NOTE — Progress Notes (Signed)
Good morning crew, please let Edward Schneider know his labs have returned: - CMP shows some elevation in glucose level, continue all diabetes medications.  Kidney and liver function are normal.  Potassium level mildly elevated, I recommend cutting back on foods high in potassium like bananas, dried fruit, mangoes, potatoes.  I would like you to return in 4 weeks for lab recheck only to assess this, as some of your medications can elevate this too and we may need to adjust. - Cholesterol levels stable, continue medications. - Prostate lab is normal. - Vitamin D level normal, continue supplements. - CBC continues to show some mild anemia, but this is improving post surgery.  Iron levels is on lower end of normal.  Please start an iron supplement daily, I recommend Slow Fe which causes less constipation issues and take with a little orange juice.  We will recheck this on lab only visit in 4 weeks too.  Any questions? Keep being amazing!!  Thank you for allowing me to participate in your care.  I appreciate you. Kindest regards, Sydney Azure

## 2021-10-23 ENCOUNTER — Ambulatory Visit: Payer: Medicare Other | Admitting: Podiatry

## 2021-10-27 ENCOUNTER — Telehealth: Payer: Self-pay | Admitting: Nurse Practitioner

## 2021-10-27 MED ORDER — SUCRALFATE 1 G PO TABS: ORAL_TABLET | ORAL | 4 refills | Status: DC

## 2021-10-27 NOTE — Telephone Encounter (Signed)
Pt came in to request a refill for Sucralfate 1GM to be sent to [facility] in [place], [place].  Pt states that [facility] is not able to get this medication due to shortage.  Pt is completely out of medication.  Please follow up with pt at [phone] with any questions/concerns.

## 2021-11-05 DIAGNOSIS — E1165 Type 2 diabetes mellitus with hyperglycemia: Secondary | ICD-10-CM | POA: Diagnosis not present

## 2021-11-05 DIAGNOSIS — E1142 Type 2 diabetes mellitus with diabetic polyneuropathy: Secondary | ICD-10-CM | POA: Diagnosis not present

## 2021-11-18 ENCOUNTER — Other Ambulatory Visit: Payer: Medicare Other

## 2021-11-19 ENCOUNTER — Other Ambulatory Visit: Payer: Medicare Other

## 2021-11-19 ENCOUNTER — Other Ambulatory Visit: Payer: Self-pay

## 2021-11-19 DIAGNOSIS — D649 Anemia, unspecified: Secondary | ICD-10-CM

## 2021-11-19 DIAGNOSIS — E875 Hyperkalemia: Secondary | ICD-10-CM | POA: Diagnosis not present

## 2021-11-20 LAB — CBC WITH DIFFERENTIAL/PLATELET
Basophils Absolute: 0.1 10*3/uL (ref 0.0–0.2)
Basos: 1 %
EOS (ABSOLUTE): 0.4 10*3/uL (ref 0.0–0.4)
Eos: 4 %
Hematocrit: 36.4 % — ABNORMAL LOW (ref 37.5–51.0)
Hemoglobin: 11.1 g/dL — ABNORMAL LOW (ref 13.0–17.7)
Immature Grans (Abs): 0 10*3/uL (ref 0.0–0.1)
Immature Granulocytes: 0 %
Lymphocytes Absolute: 2.7 10*3/uL (ref 0.7–3.1)
Lymphs: 32 %
MCH: 25.2 pg — ABNORMAL LOW (ref 26.6–33.0)
MCHC: 30.5 g/dL — ABNORMAL LOW (ref 31.5–35.7)
MCV: 83 fL (ref 79–97)
Monocytes Absolute: 0.8 10*3/uL (ref 0.1–0.9)
Monocytes: 9 %
Neutrophils Absolute: 4.6 10*3/uL (ref 1.4–7.0)
Neutrophils: 54 %
Platelets: 205 10*3/uL (ref 150–450)
RBC: 4.41 x10E6/uL (ref 4.14–5.80)
RDW: 15.7 % — ABNORMAL HIGH (ref 11.6–15.4)
WBC: 8.5 10*3/uL (ref 3.4–10.8)

## 2021-11-20 LAB — IRON AND TIBC
Iron Saturation: 11 % — ABNORMAL LOW (ref 15–55)
Iron: 40 ug/dL (ref 38–169)
Total Iron Binding Capacity: 378 ug/dL (ref 250–450)
UIBC: 338 ug/dL (ref 111–343)

## 2021-11-20 LAB — FERRITIN: Ferritin: 25 ng/mL — ABNORMAL LOW (ref 30–400)

## 2021-11-20 LAB — POTASSIUM: Potassium: 4.3 mmol/L (ref 3.5–5.2)

## 2021-11-20 NOTE — Progress Notes (Signed)
Please let Dairon know his labs have returned: Iron is on lower side of normal and CBC continues to show some anemia.  I would recommend he start taking an iron supplement, like Slow Fe over the counter, to help improve this as suspect it is related to recent surgeries.  We will recheck next visit and if still showing some anemia may send to hematology.  Potassium level back to normal this check.  Great news!!  Any questions? Keep being amazing!!  Thank you for allowing me to participate in your care.  I appreciate you. Kindest regards, Rogue Rafalski

## 2021-11-21 ENCOUNTER — Ambulatory Visit: Payer: Medicare Other | Admitting: Primary Care

## 2021-11-21 ENCOUNTER — Other Ambulatory Visit: Payer: Self-pay

## 2021-11-21 ENCOUNTER — Encounter: Payer: Self-pay | Admitting: Primary Care

## 2021-11-21 VITALS — BP 110/68 | HR 93 | Temp 97.9°F | Ht 68.0 in | Wt 246.8 lb

## 2021-11-21 DIAGNOSIS — J432 Centrilobular emphysema: Secondary | ICD-10-CM

## 2021-11-21 DIAGNOSIS — J441 Chronic obstructive pulmonary disease with (acute) exacerbation: Secondary | ICD-10-CM | POA: Diagnosis not present

## 2021-11-21 DIAGNOSIS — R202 Paresthesia of skin: Secondary | ICD-10-CM | POA: Diagnosis not present

## 2021-11-21 DIAGNOSIS — G4733 Obstructive sleep apnea (adult) (pediatric): Secondary | ICD-10-CM | POA: Diagnosis not present

## 2021-11-21 MED ORDER — STIOLTO RESPIMAT 2.5-2.5 MCG/ACT IN AERS: 2.0000 | INHALATION_SPRAY | Freq: Every day | RESPIRATORY_TRACT | 5 refills | Status: DC | PRN

## 2021-11-21 MED ORDER — STIOLTO RESPIMAT 2.5-2.5 MCG/ACT IN AERS: 2.0000 | INHALATION_SPRAY | Freq: Every day | RESPIRATORY_TRACT | 0 refills | Status: DC

## 2021-11-21 NOTE — Patient Instructions (Addendum)
Recommendation: Resume Stiolto- take 2 puffs every morning ( 2 week sample given and RX sent to [facility]) Use albuterol inhaler every 6 hours for breakthrough shortness of breath/wheezing   Referral: Neurology re: numbness right arm  Follow-up: 1 month with Edward Maize NP

## 2021-11-21 NOTE — Progress Notes (Signed)
_0  ID: Edward Schneider., male    DOB: 02-13-1956, 66 y.o.   MRN: [id number]  Chief Complaint  Patient presents with   Follow-up    Increased SOB     Referring provider: [name], NP  HPI: 66 year old male, former smoker. PMH significant for HTN, CAD, afib, GERD. Patient of Dr. Ander Slade.   11/21/2021 Patient presents today for acute OV. He reports experiencing increased shortness of breath x 2 weeks. He has associated chest tightness and wheezing. He was using Anoro and Stiolto at different times. He most recently ran out Anoro around January 8th. He does not like powder inhalers. He thinks his shortness of breath is related to his CPAP use, he feels worse when using. He has been on CPAP for over a year. He reports having right arm and leg numbness/tingling. He has some weakness right lower extremity residual from an old back surgery. Denies f/c/s, changes in speech, confusion, headaches, nasal congestion, productive cough, chest discomfort.    Allergies  Allergen Reactions   Bee Venom Anaphylaxis   Crestor [Rosuvastatin Calcium] Shortness Of Breath and Swelling   Fentanyl Itching and Hives    blisters Patch   Gabapentin Diarrhea    Severe diarrhea which caused incontinence, loss of appetite and weight loss.   Shellfish Allergy Anaphylaxis and Swelling    Shrimp causes throat to swell and tingling in tongue.    Furosemide Nausea And Vomiting   Buprenorphine Hcl Itching   Chlorhexidine Gluconate Itching and Rash   Simvastatin Diarrhea    Immunization History  Administered Date(s) Administered   Influenza,inj,Quad PF,6+ Mos 08/21/2015, 09/24/2016, 07/12/2017, 06/23/2018, 08/04/2018, 07/17/2020, 07/18/2021   Influenza-Unspecified 07/19/2014, 08/21/2015, 09/24/2016, 07/12/2017, 06/23/2018, 08/04/2018, 07/11/2019, 07/17/2020, 07/18/2021   Pneumococcal Polysaccharide-23 06/07/2012, 05/30/2020   Td 10/28/2007   Tdap 04/25/2018   Zoster Recombinat (Shingrix)  07/11/2019, 09/13/2019    Past Medical History:  Diagnosis Date   Allergy    Aortic atherosclerosis (HCC)    Asthma    C. difficile diarrhea    Chronic pain    Collagenous colitis    Coronary artery disease    a.) PCI with 2.75 x 18 mm Resolute Onyx DES x 1 to prox/mid LAD on 09/05/2019   DDD (degenerative disc disease), cervical    DDD (degenerative disc disease), lumbar    GERD (gastroesophageal reflux disease)    Grade I diastolic dysfunction    Hepatic steatosis    Hyperlipidemia    Hypertension    Liver cancer ([place]) 03/2015   Migraines    Myocardial infarction (HCC)     OSA on CPAP    Seizures ([place])    several as child when sick.  None since age 8   Stroke [facility])    'mini-stroke" 30 yrs ago. no deficits.   T2DM (type 2 diabetes mellitus) ([place])    Wears dentures    full upper and lower    Tobacco History: Social History   Tobacco Use  Smoking Status Former   Packs/day: 2.00   Years: 50.00   Pack years: 100.00   Types: Cigarettes   Quit date: 2011   Years since quitting: 12.1  Smokeless Tobacco Never   Counseling given: Not Answered   Outpatient Medications Prior to Visit  Medication Sig Dispense Refill   acetaminophen (TYLENOL) 650 MG CR tablet Take 1,300 mg by mouth every 8 (eight) hours.     Ascorbic Acid (VITAMIN C) 1000 MG tablet Take 1,000 mg by mouth  daily.      aspirin EC 81 MG tablet Take 81 mg by mouth daily. Swallow whole.     atorvastatin (LIPITOR) 80 MG tablet TAKE 1 TABLET BY MOUTH AT BEDTIME 90 tablet 2   benazepril (LOTENSIN) 10 MG tablet Take 1 tablet by mouth once daily 90 tablet 0   calcium carbonate (OS-CAL) 600 MG TABS tablet Take 600 mg by mouth daily.      cetirizine (ZYRTEC) 10 MG tablet Take 10 mg by mouth daily.     Cholecalciferol (VITAMIN D3) 25 MCG (1000 UT) CHEW Chew 1,000 Units by mouth daily.      CINNAMON PO Take 1,000 mg by mouth 2 (two) times daily.      clobetasol cream (TEMOVATE) 4.62 % Apply 1 application topically 2  (two) times daily. 30 g 0   Continuous Blood Gluc Sensor (FREESTYLE LIBRE 2 SENSOR) MISC Use as instructed to check blood sugar daily 2 each 2   cyclobenzaprine (FLEXERIL) 10 MG tablet Take 1 tablet (10 mg total) by mouth 3 (three) times daily as needed for muscle spasms. 60 tablet 1   dapagliflozin propanediol (FARXIGA) 10 MG TABS tablet Take 1 tablet (10 mg total) by mouth daily before breakfast. 90 tablet 3   diclofenac Sodium (VOLTAREN) 1 % GEL Apply 2 g topically 4 (four) times daily as needed (pain).      diphenhydrAMINE (BENADRYL) 25 mg capsule Take 50 mg by mouth in the morning, at noon, and at bedtime.     docusate sodium (COLACE) 100 MG capsule Take 1 capsule (100 mg total) by mouth 2 (two) times daily. 20 capsule 0   doxepin (SINEQUAN) 25 MG capsule Take 50 mg by mouth at bedtime.      Dulaglutide (TRULICITY) 3 VO/3.5KK SOPN Inject 3 mg as directed once a week. 6 mL 3   DULoxetine (CYMBALTA) 30 MG capsule Take 30-60 mg by mouth daily.     DULoxetine (CYMBALTA) 60 MG capsule Take 1 capsule (60 mg total) by mouth at bedtime. 90 capsule 4   enoxaparin (LOVENOX) 40 MG/0.4ML injection Inject 0.4 mLs (40 mg total) into the skin daily for 14 days. 5.6 mL 0   Ginger, Zingiber officinalis, (GINGER PO) Take 1 tablet by mouth daily.     Ginseng 100 MG CAPS Take 100 mg by mouth daily.      Insulin Glargine (BASAGLAR KWIKPEN) 100 UNIT/ML Inject 32 Units into the skin at bedtime.     insulin lispro (HUMALOG KWIKPEN) 200 UNIT/ML KwikPen Inject 16-22 Units into the skin See admin instructions. Injecting 22 units with Breakfast, 16 units with lunch and supper     isosorbide mononitrate (IMDUR) 60 MG 24 hr tablet Take 1 tablet (60 mg total) by mouth daily. 90 tablet 4   Multiple Vitamins-Minerals (CENTRUM SILVER 50+MEN) TABS Take 1 tablet by mouth daily.     mupirocin ointment (BACTROBAN) 2 % Apply 1 application topically daily as needed.     NARCAN 4 MG/0.1ML LIQD nasal spray kit Place 0.4 mg into the  nose as needed (opioid overdose).      nitroGLYCERIN (NITROSTAT) 0.4 MG SL tablet Place 0.4 mg under the tongue every 5 (five) minutes as needed for chest pain.      Omega-3 1000 MG CAPS Take 1,000 mg by mouth daily.      oxyCODONE 10 MG TABS Take 1-1.5 tablets (10-15 mg total) by mouth every 3 (three) hours as needed for severe pain (pain score 7-10). 40 tablet 0  pantoprazole (PROTONIX) 40 MG tablet Take 1 tablet by mouth twice daily 180 tablet 2   pregabalin (LYRICA) 50 MG capsule TAKE 1 CAPSULE BY MOUTH THREE TIMES DAILY 90 capsule 5   PRESCRIPTION MEDICATION CPAP     senna-docusate (SENOKOT-S) 8.6-50 MG tablet Take 1 tablet by mouth at bedtime as needed for mild constipation. 10 tablet 0   sucralfate (CARAFATE) 1 g tablet TAKE 1 TABLET BY MOUTH 4 TIMES DAILY WITH MEALS AND AT BEDTIME 360 tablet 4   traMADol (ULTRAM) 50 MG tablet Take 1 tablet (50 mg total) by mouth every 6 (six) hours as needed. 30 tablet 0   traZODone (DESYREL) 100 MG tablet TAKE 1 TABLET BY MOUTH AT BEDTIME AS NEEDED FOR SLEEP 90 tablet 1   vitamin A 10000 UNIT capsule Take 10,000 Units by mouth daily.     vitamin B-12 (CYANOCOBALAMIN) 1000 MCG tablet Take 1,000 mcg by mouth daily.     Vitamin D, Ergocalciferol, (DRISDOL) 1.25 MG (50000 UNIT) CAPS capsule Take 1 capsule (50,000 Units total) by mouth every 7 (seven) days. 12 capsule 0   Vitamin E 400 units TABS Take 400 Units by mouth daily.      ezetimibe (ZETIA) 10 MG tablet Take 1 tablet by mouth once daily 30 tablet 6   albuterol (VENTOLIN HFA) 108 (90 Base) MCG/ACT inhaler Inhale 2 puffs into the lungs every 6 (six) hours as needed for wheezing or shortness of breath. (Patient not taking: Reported on 11/21/2021) 16 g 3   Tiotropium Bromide-Olodaterol (STIOLTO RESPIMAT) 2.5-2.5 MCG/ACT AERS Inhale 2 puffs into the lungs daily as needed (shortness of breath). (Patient not taking: Reported on 11/21/2021) 1 each 5   No facility-administered medications prior to visit.     Review of Systems  Review of Systems  Constitutional: Negative.   HENT: Negative.    Respiratory:  Positive for chest tightness, shortness of breath and wheezing. Negative for cough.   Neurological:  Positive for numbness.    Physical Exam  BP 110/68 (BP Location: Right Arm, Patient Position: Sitting, Cuff Size: Normal)    Pulse 93    Temp 97.9 F (36.6 C) (Oral)    Ht 5' 8" (1.727 m)    Wt 246 lb 12.8 oz (111.9 kg)    SpO2 96%    BMI 37.53 kg/m  Physical Exam Constitutional:      Appearance: Normal appearance.  HENT:     Head: Normocephalic and atraumatic.  Cardiovascular:     Rate and Rhythm: Normal rate and regular rhythm.  Pulmonary:     Effort: Pulmonary effort is normal.     Breath sounds: Normal breath sounds. No wheezing.     Comments: No wheezing on exam Musculoskeletal:        General: Normal range of motion.  Skin:    General: Skin is warm and dry.  Neurological:     General: No focal deficit present.     Mental Status: He is alert and oriented to person, place, and time. Mental status is at baseline.     Comments: Neuros grossly intact. +3/4 strengh RLE. Right arm/leg numbness/tingling  Psychiatric:        Mood and Affect: Mood normal.        Behavior: Behavior normal.        Thought Content: Thought content normal.        Judgment: Judgment normal.     Lab Results:  CBC    Component Value Date/Time   WBC 8.5  11/19/2021 0835   WBC 13.8 (H) 08/31/2021 1052   RBC 4.41 11/19/2021 0835   RBC 4.01 (L) 08/31/2021 1052   HGB 11.1 (L) 11/19/2021 0835   HCT 36.4 (L) 11/19/2021 0835   PLT 205 11/19/2021 0835   MCV 83 11/19/2021 0835   MCV 92 02/28/2014 1845   MCH 25.2 (L) 11/19/2021 0835   MCH 26.2 08/31/2021 1052   MCHC 30.5 (L) 11/19/2021 0835   MCHC 30.9 08/31/2021 1052   RDW 15.7 (H) 11/19/2021 0835   RDW 13.6 02/28/2014 1845   LYMPHSABS 2.7 11/19/2021 0835   LYMPHSABS 2.6 02/28/2014 1845   MONOABS 1.1 (H) 08/31/2021 1052   MONOABS 0.9  02/28/2014 1845   EOSABS 0.4 11/19/2021 0835   EOSABS 0.1 02/28/2014 1845   BASOSABS 0.1 11/19/2021 0835   BASOSABS 0.0 02/28/2014 1845    BMET    Component Value Date/Time   NA 137 10/17/2021 1105   NA 136 02/28/2014 1845   K 4.3 11/19/2021 0835   K 4.0 02/28/2014 1845   CL 100 10/17/2021 1105   CL 104 02/28/2014 1845   CO2 22 10/17/2021 1105   CO2 26 02/28/2014 1845   GLUCOSE 272 (H) 10/17/2021 1105   GLUCOSE 245 (H) 08/31/2021 1052   GLUCOSE 105 (H) 02/28/2014 1845   BUN 26 10/17/2021 1105   BUN 13 02/28/2014 1845   CREATININE 1.14 10/17/2021 1105   CREATININE 0.85 02/28/2014 1845   CALCIUM 10.0 10/17/2021 1105   CALCIUM 8.9 02/28/2014 1845   GFRNONAA >60 08/31/2021 1052   GFRNONAA >60 02/28/2014 1845   GFRAA 75 12/06/2020 1554   GFRAA >60 02/28/2014 1845    BNP    Component Value Date/Time   BNP 48.6 08/25/2021 1217    ProBNP No results found for: PROBNP  Imaging: No results found.   Assessment & Plan:   Emphysema lung ([place]) - Patient reports increased shortness of breath with associated chest tightness and wheezing since being off LABA/LAMA. Recommend resuming Stiolto Respimat and following up in 1 month. If still having sob despite resuming maintenance inhaler would get chest imaging CXR.   Sleep apnea - Unable to pull patient up in airview. Consider cpap titration if continues having difficulty with cpap   Paresthesia of right arm - Neuro's grossly intact - Referring to neurology for devaluation right/leg arm numbness/tingling    Martyn Ehrich, NP 11/24/2021

## 2021-11-24 ENCOUNTER — Other Ambulatory Visit: Payer: Self-pay | Admitting: Internal Medicine

## 2021-11-24 ENCOUNTER — Encounter: Payer: Self-pay | Admitting: Primary Care

## 2021-11-24 DIAGNOSIS — M5136 Other intervertebral disc degeneration, lumbar region: Secondary | ICD-10-CM | POA: Diagnosis not present

## 2021-11-24 DIAGNOSIS — M545 Low back pain, unspecified: Secondary | ICD-10-CM | POA: Diagnosis not present

## 2021-11-24 DIAGNOSIS — G894 Chronic pain syndrome: Secondary | ICD-10-CM | POA: Diagnosis not present

## 2021-11-24 DIAGNOSIS — R202 Paresthesia of skin: Secondary | ICD-10-CM | POA: Insufficient documentation

## 2021-11-24 NOTE — Assessment & Plan Note (Signed)
-   Patient reports increased shortness of breath with associated chest tightness and wheezing since being off LABA/LAMA. Recommend resuming Stiolto Respimat and following up in 1 month. If still having sob despite resuming maintenance inhaler would get chest imaging CXR.

## 2021-11-24 NOTE — Assessment & Plan Note (Addendum)
-   Unable to pull patient up in airview. Consider cpap titration if continues having difficulty with cpap

## 2021-11-24 NOTE — Assessment & Plan Note (Addendum)
-   Neuro's grossly intact - Referring to neurology for devaluation right/leg arm numbness/tingling

## 2021-12-01 ENCOUNTER — Encounter: Payer: Self-pay | Admitting: Nurse Practitioner

## 2021-12-01 ENCOUNTER — Telehealth: Payer: Self-pay | Admitting: Nurse Practitioner

## 2021-12-01 NOTE — Telephone Encounter (Signed)
Called patient to advise that form and letter has been completed, ready for pickup.

## 2021-12-01 NOTE — Telephone Encounter (Signed)
Patient dropped off [employer] summons for provider to fill out to excuse him from [employer] due to medical concerns.  Patient states that he is not able to sit for long due to diabetes and has a frequent need for restroom use.  Please contact patient at [phone] with any questions/concerns.  Patient will pickup paperwork when completed.  Placed in provider's folder for review.

## 2021-12-06 DIAGNOSIS — E1165 Type 2 diabetes mellitus with hyperglycemia: Secondary | ICD-10-CM | POA: Diagnosis not present

## 2021-12-06 DIAGNOSIS — E1142 Type 2 diabetes mellitus with diabetic polyneuropathy: Secondary | ICD-10-CM | POA: Diagnosis not present

## 2021-12-10 ENCOUNTER — Ambulatory Visit: Payer: Self-pay | Admitting: *Deleted

## 2021-12-10 NOTE — Telephone Encounter (Signed)
°  Chief Complaint: blisters in mouth, on lips and in nose Symptoms: painful blisters Frequency: N/A Pertinent Negatives: Patient denies  Disposition: [] ED /[] Urgent Care (no appt availability in office) / [x] Appointment(In office/virtual)/ []  [facility]/ [] Home Care/ [] Refused Recommended Disposition /[]  Mobile Bus/ []  Follow-up with PCP Additional Notes: Instructed to call 911 if he develops symptoms.

## 2021-12-10 NOTE — Telephone Encounter (Signed)
Reason for Disposition  [1] Multiple small blisters grouped together in one area of body (i.e., dermatomal distribution or "band" or "stripe") AND [2] painful    He requested the appt be for Friday so agent made it for then.  Answer Assessment - Initial Assessment Questions 1. APPEARANCE of SORES: "What do the sores look like?"     I have blisters in my mouth and nose.    I want to wait until Friday because my wife is out of town.   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   I've eaten lobster before without a problem and I ate one at [facility].Marland Kitchen He denies shortness of breath, throat feeling like it's closing or wheezing.    2. NUMBER: "How many sores are there?"     I have these painful blisters inside my mouth and my nose.  I'm putting Blistex on them and taking 2 Benadryl every morning, at lunch and at night but I do that anyway all the time 3. SIZE: "How big is the largest sore?"     *No Answer* 4. LOCATION: "Where are the sores located?"     Inside mouth, lips and in nose 5. ONSET: "When did the sores begin?"     *No Answer* 6. CAUSE: "What do you think is causing the sores?"     I'm not sure unless it's from the lobster tail I ate but I've never had a reaction to lobster before. 7. OTHER SYMPTOMS: "Do you have any other symptoms?" (e.g., fever, new weakness)     No  Protocols used: Sores-A-AH

## 2021-12-12 ENCOUNTER — Ambulatory Visit (INDEPENDENT_AMBULATORY_CARE_PROVIDER_SITE_OTHER): Payer: Medicare Other | Admitting: Family Medicine

## 2021-12-12 ENCOUNTER — Encounter: Payer: Self-pay | Admitting: Family Medicine

## 2021-12-12 ENCOUNTER — Other Ambulatory Visit: Payer: Self-pay

## 2021-12-12 VITALS — BP 119/70 | HR 88 | Wt 247.2 lb

## 2021-12-12 DIAGNOSIS — M7061 Trochanteric bursitis, right hip: Secondary | ICD-10-CM | POA: Diagnosis not present

## 2021-12-12 DIAGNOSIS — T61781A Other shellfish poisoning, accidental (unintentional), initial encounter: Secondary | ICD-10-CM | POA: Diagnosis not present

## 2021-12-12 DIAGNOSIS — S00522A Blister (nonthermal) of oral cavity, initial encounter: Secondary | ICD-10-CM

## 2021-12-12 DIAGNOSIS — M1611 Unilateral primary osteoarthritis, right hip: Secondary | ICD-10-CM | POA: Diagnosis not present

## 2021-12-12 DIAGNOSIS — Z96651 Presence of right artificial knee joint: Secondary | ICD-10-CM | POA: Diagnosis not present

## 2021-12-12 DIAGNOSIS — E1165 Type 2 diabetes mellitus with hyperglycemia: Secondary | ICD-10-CM | POA: Diagnosis not present

## 2021-12-12 DIAGNOSIS — Z96641 Presence of right artificial hip joint: Secondary | ICD-10-CM | POA: Diagnosis not present

## 2021-12-12 DIAGNOSIS — M1711 Unilateral primary osteoarthritis, right knee: Secondary | ICD-10-CM | POA: Diagnosis not present

## 2021-12-12 DIAGNOSIS — I700 Atherosclerosis of aorta: Secondary | ICD-10-CM | POA: Diagnosis not present

## 2021-12-12 MED ORDER — LIDOCAINE VISCOUS HCL 2 % MT SOLN: 15.0000 mL | OROMUCOSAL | 2 refills | Status: AC | PRN

## 2021-12-12 MED ORDER — EPINEPHRINE 0.3 MG/0.3ML IJ SOAJ: 0.3000 mg | INTRAMUSCULAR | 12 refills | Status: AC | PRN

## 2021-12-12 MED ORDER — TRIAMCINOLONE ACETONIDE 40 MG/ML IJ SUSP
40.0000 mg | Freq: Once | INTRAMUSCULAR | Status: AC
  Administered 2021-12-12: 40 mg via INTRAMUSCULAR

## 2021-12-12 MED ORDER — PREDNISONE 10 MG PO TABS: ORAL_TABLET | ORAL | 0 refills | Status: DC

## 2021-12-12 MED ORDER — CETIRIZINE HCL 10 MG PO TABS: 10.0000 mg | ORAL_TABLET | Freq: Every day | ORAL | 1 refills | Status: DC

## 2021-12-12 MED ORDER — FAMOTIDINE 20 MG PO TABS: 20.0000 mg | ORAL_TABLET | Freq: Two times a day (BID) | ORAL | 2 refills | Status: DC

## 2021-12-12 NOTE — Assessment & Plan Note (Signed)
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 Now with angioedema with lobster. Advised him to avoid all shellfish. Will check RAST panel today. Triamcinalone shot today, will treat with prednisone, zyrtec, pepcid and benadryl. Lidocaine mouthwash for discomfort. Follow up with PCP next week. Call with any concerns.

## 2021-12-12 NOTE — Progress Notes (Signed)
BP 119/70    Pulse 88    Wt 247 lb 3.2 oz (112.1 kg)    SpO2 97%    BMI 37.59 kg/m    Subjective:    Patient ID: Edward Schneider., male    DOB: 04/11/1956, 66 y.o.   MRN: [id number]  HPI: Edward Schneider. is a 66 y.o. male  Chief Complaint  Patient presents with   Blister    Patient states a week ago he ate lobster, and then developed blisters in his mouth and nose. Patient states his hands swelled up but that has went down.    Ate Lobster about a week ago and his mouth swelled up and developed a lot of blisters in his mouth. It's been very painful. Has been having trouble eating. Has been having issues with pain in his mouth. He notes that he has been taking benadryl. His swelling has gone down, but the blisters have remained and are very uncomfortable.   Relevant past medical, surgical, family and social history reviewed and updated as indicated. Interim medical history since our last visit reviewed. Allergies and medications reviewed and updated.  Review of Systems  Constitutional: Negative.   HENT: Negative.    Respiratory: Negative.    Cardiovascular: Negative.   Gastrointestinal: Negative.   Musculoskeletal: Negative.   Skin:  Positive for rash. Negative for color change, pallor and wound.  Neurological: Negative.   Psychiatric/Behavioral: Negative.     Per HPI unless specifically indicated above     Objective:    BP 119/70    Pulse 88    Wt 247 lb 3.2 oz (112.1 kg)    SpO2 97%    BMI 37.59 kg/m   Wt Readings from Last 3 Encounters:  12/12/21 247 lb 3.2 oz (112.1 kg)  11/21/21 246 lb 12.8 oz (111.9 kg)  10/17/21 240 lb 9.6 oz (109.1 kg)    Physical Exam Vitals and nursing note reviewed.  Constitutional:      General: He is not in acute distress.    Appearance: Normal appearance. He is not ill-appearing, toxic-appearing or diaphoretic.  HENT:     Head: Normocephalic and atraumatic.     Right Ear: External ear normal.     Left Ear: External ear normal.      Nose: Nose normal.     Mouth/Throat:     Mouth: Mucous membranes are moist.     Pharynx: Oropharynx is clear.     Comments: Mild swelling of lips with blisters on both lips  Eyes:     General: No scleral icterus.       Right eye: No discharge.        Left eye: No discharge.     Extraocular Movements: Extraocular movements intact.     Conjunctiva/sclera: Conjunctivae normal.     Pupils: Pupils are equal, round, and reactive to light.  Cardiovascular:     Rate and Rhythm: Normal rate and regular rhythm.     Pulses: Normal pulses.     Heart sounds: Normal heart sounds. No murmur heard.   No friction rub. No gallop.  Pulmonary:     Effort: Pulmonary effort is normal. No respiratory distress.     Breath sounds: Normal breath sounds. No stridor. No wheezing, rhonchi or rales.  Chest:     Chest wall: No tenderness.  Musculoskeletal:        General: Normal range of motion.     Cervical back: Normal range of motion and  neck supple.  Skin:    General: Skin is warm and dry.     Capillary Refill: Capillary refill takes less than 2 seconds.     Coloration: Skin is not jaundiced or pale.     Findings: No bruising, erythema, lesion or rash.  Neurological:     General: No focal deficit present.     Mental Status: He is alert and oriented to person, place, and time. Mental status is at baseline.  Psychiatric:        Mood and Affect: Mood normal.        Behavior: Behavior normal.        Thought Content: Thought content normal.        Judgment: Judgment normal.    Results for orders placed or performed in visit on 11/19/21  Potassium  Result Value Ref Range   Potassium 4.3 3.5 - 5.2 mmol/L  Ferritin  Result Value Ref Range   Ferritin 25 (L) 30 - 400 ng/mL  Iron Binding Cap (TIBC)([facility])  Result Value Ref Range   Total Iron Binding Capacity 378 250 - 450 ug/dL   UIBC 338 111 - 343 ug/dL   Iron 40 38 - 169 ug/dL   Iron Saturation 11 (L) 15 - 55 %  CBC with  Differential/Platelet  Result Value Ref Range   WBC 8.5 3.4 - 10.8 x10E3/uL   RBC 4.41 4.14 - 5.80 x10E6/uL   Hemoglobin 11.1 (L) 13.0 - 17.7 g/dL   Hematocrit 36.4 (L) 37.5 - 51.0 %   MCV 83 79 - 97 fL   MCH 25.2 (L) 26.6 - 33.0 pg   MCHC 30.5 (L) 31.5 - 35.7 g/dL   RDW 15.7 (H) 11.6 - 15.4 %   Platelets 205 150 - 450 x10E3/uL   Neutrophils 54 Not Estab. %   Lymphs 32 Not Estab. %   Monocytes 9 Not Estab. %   Eos 4 Not Estab. %   Basos 1 Not Estab. %   Neutrophils Absolute 4.6 1.4 - 7.0 x10E3/uL   Lymphocytes Absolute 2.7 0.7 - 3.1 x10E3/uL   Monocytes Absolute 0.8 0.1 - 0.9 x10E3/uL   EOS (ABSOLUTE) 0.4 0.0 - 0.4 x10E3/uL   Basophils Absolute 0.1 0.0 - 0.2 x10E3/uL   Immature Granulocytes 0 Not Estab. 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 Now with angioedema with lobster. Advised him to avoid all shellfish. Will check RAST panel today. Triamcinalone shot today, will treat with prednisone, zyrtec, pepcid and benadryl. Lidocaine mouthwash for discomfort. Follow up with PCP next week. Call with any concerns.       Relevant Medications   triamcinolone acetonide (KENALOG-40) injection 40 mg (Start on 12/12/2021  4:30 PM)   Other Relevant Orders   Allergen Profile, Shellfish   Other Visit Diagnoses     Traumatic blister of mouth, initial encounter       Likely due to allergic reaction, but will check for HSV given appearance.    Relevant Orders   HSV(herpes simplex vrs) 1+2 ab-IgG        Follow up plan: Return 1-2 weeks with PCP.

## 2021-12-15 ENCOUNTER — Telehealth: Payer: Self-pay | Admitting: Internal Medicine

## 2021-12-15 NOTE — Telephone Encounter (Signed)
Pt c/o of Chest Pain: STAT if CP now or developed within 24 hours  1. Are you having CP right now? No, last episode was this past Friday  2. Are you experiencing any other symptoms (ex. SOB, nausea, vomiting, sweating)? Right side of face, shoulder, arm, leg, whole right side was numb, states he has a neurology appointment on 3/20, after episode on Friday, had a severe headache, he went into the closet and stayed there most of the day.  3. How long have you been experiencing CP? About a month  4. Is your CP continuous or coming and going? Comes and goes  5. Have you taken Nitroglycerin?  On Friday Feb. 24th ?

## 2021-12-15 NOTE — Telephone Encounter (Signed)
Patient reports chest pain, numbness to right side from face down to his toes which lasts 5-10 minutes. Pain is on his right side, arm, face, and middle of his chest. He saw pulmonary and they told him it sounded like he could be having mini strokes and set him up to see neurology. He also reports some shortness of breath with these episodes as well. Inquired if his pain in the middle of his chest gets worse when pressing on his chest. He admits that makes the pain worse. Discussed how this can be musculoskeletal pain if it hurts with pressing. Reviewed that this pain does not sound like it is from his heart but will schedule him to come in for evaluation. Discussed his reports of taking one nitroglycerin pill on 2/14. He took pill and it did help his pain subside at that time. Reports pain comes and goes. Offered appointment for next week. Reviewed strict ED precautions along with signs and symptoms. He verbalized understanding with no further questions at this time.

## 2021-12-16 NOTE — Telephone Encounter (Signed)
I agree with plan as outlined below.  Thanks.  Nelva Bush, MD [facility]

## 2021-12-18 LAB — ALLERGEN PROFILE, SHELLFISH
Clam IgE: 0.5 kU/L — AB
F023-IgE Crab: 2.6 kU/L — AB
F080-IgE Lobster: 2.95 kU/L — AB
F290-IgE Oyster: 0.18 kU/L — AB
Scallop IgE: 0.56 kU/L — AB
Shrimp IgE: 28.7 kU/L — AB

## 2021-12-18 LAB — HSV(HERPES SIMPLEX VRS) I + II AB-IGG
HSV 1 Glycoprotein G Ab, IgG: 51.2 index — ABNORMAL HIGH (ref 0.00–0.90)
HSV 2 IgG, Type Spec: <0.91 index (ref 0.00–0.90)

## 2021-12-18 MED ORDER — VALACYCLOVIR HCL 1 G PO TABS: 1000.0000 mg | ORAL_TABLET | Freq: Two times a day (BID) | ORAL | 0 refills | Status: AC

## 2021-12-18 NOTE — Addendum Note (Signed)
Addended by: Valerie Roys on: 12/18/2021 11:58 AM   Modules accepted: Orders

## 2021-12-19 ENCOUNTER — Encounter: Payer: Self-pay | Admitting: Primary Care

## 2021-12-19 ENCOUNTER — Ambulatory Visit (INDEPENDENT_AMBULATORY_CARE_PROVIDER_SITE_OTHER): Payer: Medicare Other | Admitting: Primary Care

## 2021-12-19 ENCOUNTER — Other Ambulatory Visit: Payer: Self-pay

## 2021-12-19 ENCOUNTER — Ambulatory Visit (INDEPENDENT_AMBULATORY_CARE_PROVIDER_SITE_OTHER): Payer: Medicare Other

## 2021-12-19 VITALS — BP 106/70 | HR 77 | Temp 98.3°F | Ht 68.0 in | Wt 245.4 lb

## 2021-12-19 DIAGNOSIS — R0602 Shortness of breath: Secondary | ICD-10-CM

## 2021-12-19 DIAGNOSIS — J432 Centrilobular emphysema: Secondary | ICD-10-CM

## 2021-12-19 DIAGNOSIS — G4733 Obstructive sleep apnea (adult) (pediatric): Secondary | ICD-10-CM

## 2021-12-19 MED ORDER — FUROSEMIDE 20 MG PO TABS: 20.0000 mg | ORAL_TABLET | Freq: Two times a day (BID) | ORAL | 0 refills | Status: DC

## 2021-12-19 NOTE — Progress Notes (Signed)
_0  ID: Edward Flesher., male    DOB: [birth year], 66 y.o.   MRN: [id number]  Chief Complaint  Patient presents with   Follow-up    Follow up. Patient says he is still having trouble breathing.     Referring provider: [name], NP  HPI: 66 year old male, former smoker. PMH significant for HTN, CAD, afib, GERD. Patient of Dr. Ander Slade.   Previous [facility] pulmonary encounter:  11/21/2021 Patient presents today for acute OV. He reports experiencing increased shortness of breath x 2 weeks. He has associated chest tightness and wheezing. He was using Anoro and Stiolto at different times. He most recently ran out Anoro around January 8th. He does not like powder inhalers. He thinks his shortness of breath is related to his CPAP use, he feels worse when using. He has been on CPAP for over a year. He reports having right arm and leg numbness/tingling. He has some weakness right lower extremity residual from an old back surgery. Denies f/c/s, changes in speech, confusion, headaches, nasal congestion, productive cough, chest discomfort.   12/19/2021- Interim hx  Patient presents today 1 month follow-up. Hx emphysema, sleep apnea. During last visit patient reports increased shortness of breath with associated chest tightness and wheezing since being of LABA/LAMA. Instructed him to resume daily use of his Stiolto Respimat inhaler. If symptoms persist would get chest imaging. He was referred to neurology d/t numbness in right arm/leg, he has an apt with Dr. Posey Pronto end of March.   He still having shortness of breath, feels it is related to his CPAP. He feels no improvement in his breathing since resuming Stiolto, however, he does report wheezing stopping since he restarted. He reports compliance with CPAP every night.  He has gained 7 lbs since last visit. He has been on diuretic in the past but not current.    Allergies  Allergen Reactions   Bee Venom Anaphylaxis   Crestor [Rosuvastatin Calcium]  Shortness Of Breath and Swelling   Fentanyl Itching and Hives    blisters Patch   Gabapentin Diarrhea    Severe diarrhea which caused incontinence, loss of appetite and weight loss.   Shellfish Allergy Anaphylaxis and Swelling    Shrimp causes throat to swell and tingling in tongue.    Furosemide Nausea And Vomiting   Buprenorphine Hcl Itching   Chlorhexidine Gluconate Itching and Rash   Simvastatin Diarrhea    Immunization History  Administered Date(s) Administered   Influenza,inj,Quad PF,6+ Mos 08/21/2015, 09/24/2016, 07/12/2017, 06/23/2018, 08/04/2018, 07/17/2020, 07/18/2021   Influenza-Unspecified 07/19/2014, 08/21/2015, 09/24/2016, 07/12/2017, 06/23/2018, 08/04/2018, 07/11/2019, 07/17/2020, 07/18/2021   Pneumococcal Polysaccharide-23 06/07/2012, 05/30/2020   Td 10/28/2007   Tdap 04/25/2018   Zoster Recombinat (Shingrix) 07/11/2019, 09/13/2019    Past Medical History:  Diagnosis Date   Allergy    Aortic atherosclerosis (HCC)    Asthma    C. difficile diarrhea    Chronic pain    Collagenous colitis    Coronary artery disease    a.) PCI with 2.75 x 18 mm Resolute Onyx DES x 1 to prox/mid LAD on 09/05/2019   DDD (degenerative disc disease), cervical    DDD (degenerative disc disease), lumbar    GERD (gastroesophageal reflux disease)    Grade I diastolic dysfunction    Hepatic steatosis    Hyperlipidemia    Hypertension    Liver cancer ([place]) 03/2015   Migraines    Myocardial infarction (HCC)     OSA on CPAP  Seizures ([employer])    several as child when sick.  None since age 74   Stroke [facility])    'mini-stroke" 30 yrs ago. no deficits.   T2DM (type 2 diabetes mellitus) ([place])    Wears dentures    full upper and lower    Tobacco History: Social History   Tobacco Use  Smoking Status Former   Packs/day: 2.00   Years: 50.00   Pack years: 100.00   Types: Cigarettes   Quit date: 2011   Years since quitting: 12.1  Smokeless Tobacco Never   Counseling given: Not  Answered   Outpatient Medications Prior to Visit  Medication Sig Dispense Refill   acetaminophen (TYLENOL) 650 MG CR tablet Take 1,300 mg by mouth every 8 (eight) hours.     albuterol (VENTOLIN HFA) 108 (90 Base) MCG/ACT inhaler Inhale 2 puffs into the lungs every 6 (six) hours as needed for wheezing or shortness of breath. 16 g 3   Ascorbic Acid (VITAMIN C) 1000 MG tablet Take 1,000 mg by mouth daily.      aspirin EC 81 MG tablet Take 81 mg by mouth daily. Swallow whole.     atorvastatin (LIPITOR) 80 MG tablet TAKE 1 TABLET BY MOUTH AT BEDTIME 90 tablet 2   benazepril (LOTENSIN) 10 MG tablet Take 1 tablet by mouth once daily 90 tablet 0   calcium carbonate (OS-CAL) 600 MG TABS tablet Take 600 mg by mouth daily.      cetirizine (ZYRTEC) 10 MG tablet Take 1 tablet (10 mg total) by mouth daily. 90 tablet 1   Cholecalciferol (VITAMIN D3) 25 MCG (1000 UT) CHEW Chew 1,000 Units by mouth daily.      CINNAMON PO Take 1,000 mg by mouth 2 (two) times daily.      clobetasol cream (TEMOVATE) 1.96 % Apply 1 application topically 2 (two) times daily. 30 g 0   Continuous Blood Gluc Sensor (FREESTYLE LIBRE 2 SENSOR) MISC Use as instructed to check blood sugar daily 2 each 2   cyclobenzaprine (FLEXERIL) 10 MG tablet Take 1 tablet (10 mg total) by mouth 3 (three) times daily as needed for muscle spasms. 60 tablet 1   dapagliflozin propanediol (FARXIGA) 10 MG TABS tablet Take 1 tablet (10 mg total) by mouth daily before breakfast. 90 tablet 3   diclofenac Sodium (VOLTAREN) 1 % GEL Apply 2 g topically 4 (four) times daily as needed (pain).      diphenhydrAMINE (BENADRYL) 25 mg capsule Take 50 mg by mouth in the morning, at noon, and at bedtime.     docusate sodium (COLACE) 100 MG capsule Take 1 capsule (100 mg total) by mouth 2 (two) times daily. 20 capsule 0   doxepin (SINEQUAN) 25 MG capsule Take 50 mg by mouth at bedtime.      Dulaglutide (TRULICITY) 3 QI/2.9NL SOPN Inject 3 mg as directed once a week. 6 mL 3    DULoxetine (CYMBALTA) 30 MG capsule Take 30-60 mg by mouth daily.     DULoxetine (CYMBALTA) 60 MG capsule Take 1 capsule (60 mg total) by mouth at bedtime. 90 capsule 4   enoxaparin (LOVENOX) 40 MG/0.4ML injection Inject 0.4 mLs (40 mg total) into the skin daily for 14 days. 5.6 mL 0   EPINEPHrine 0.3 mg/0.3 mL IJ SOAJ injection Inject 0.3 mg into the muscle as needed for anaphylaxis. 1 each 12   ezetimibe (ZETIA) 10 MG tablet Take 1 tablet by mouth once daily 30 tablet 3   famotidine (PEPCID) 20  MG tablet Take 1 tablet (20 mg total) by mouth 2 (two) times daily. 60 tablet 2   Ginger, Zingiber officinalis, (GINGER PO) Take 1 tablet by mouth daily.     Ginseng 100 MG CAPS Take 100 mg by mouth daily.      Insulin Glargine (BASAGLAR KWIKPEN) 100 UNIT/ML Inject 32 Units into the skin at bedtime.     insulin lispro (HUMALOG KWIKPEN) 200 UNIT/ML KwikPen Inject 16-22 Units into the skin See admin instructions. Injecting 22 units with Breakfast, 16 units with lunch and supper     isosorbide mononitrate (IMDUR) 60 MG 24 hr tablet Take 1 tablet (60 mg total) by mouth daily. 90 tablet 4   lidocaine (XYLOCAINE) 2 % solution Use as directed 15 mLs in the mouth or throat as needed for mouth pain. 100 mL 2   Multiple Vitamins-Minerals (CENTRUM SILVER 50+MEN) TABS Take 1 tablet by mouth daily.     mupirocin ointment (BACTROBAN) 2 % Apply 1 application topically daily as needed.     NARCAN 4 MG/0.1ML LIQD nasal spray kit Place 0.4 mg into the nose as needed (opioid overdose).      nitroGLYCERIN (NITROSTAT) 0.4 MG SL tablet Place 0.4 mg under the tongue every 5 (five) minutes as needed for chest pain.      Omega-3 1000 MG CAPS Take 1,000 mg by mouth daily.      oxyCODONE 10 MG TABS Take 1-1.5 tablets (10-15 mg total) by mouth every 3 (three) hours as needed for severe pain (pain score 7-10). 40 tablet 0   pantoprazole (PROTONIX) 40 MG tablet Take 1 tablet by mouth twice daily 180 tablet 2   predniSONE  (DELTASONE) 10 MG tablet 6 tabs tomorrow and Sunday, 5 tabs the next 2 days, decrease by 1 every other day until gone 42 tablet 0   pregabalin (LYRICA) 50 MG capsule TAKE 1 CAPSULE BY MOUTH THREE TIMES DAILY 90 capsule 5   PRESCRIPTION MEDICATION CPAP     senna-docusate (SENOKOT-S) 8.6-50 MG tablet Take 1 tablet by mouth at bedtime as needed for mild constipation. 10 tablet 0   sucralfate (CARAFATE) 1 g tablet TAKE 1 TABLET BY MOUTH 4 TIMES DAILY WITH MEALS AND AT BEDTIME 360 tablet 4   Tiotropium Bromide-Olodaterol (STIOLTO RESPIMAT) 2.5-2.5 MCG/ACT AERS Inhale 2 puffs into the lungs daily as needed (shortness of breath). 1 each 5   Tiotropium Bromide-Olodaterol (STIOLTO RESPIMAT) 2.5-2.5 MCG/ACT AERS Inhale 2 puffs into the lungs daily. 4 g 0   traMADol (ULTRAM) 50 MG tablet Take 1 tablet (50 mg total) by mouth every 6 (six) hours as needed. 30 tablet 0   traZODone (DESYREL) 100 MG tablet TAKE 1 TABLET BY MOUTH AT BEDTIME AS NEEDED FOR SLEEP 90 tablet 1   valACYclovir (VALTREX) 1000 MG tablet Take 1 tablet (1,000 mg total) by mouth 2 (two) times daily for 10 days. 20 tablet 0   vitamin A 10000 UNIT capsule Take 10,000 Units by mouth daily.     vitamin B-12 (CYANOCOBALAMIN) 1000 MCG tablet Take 1,000 mcg by mouth daily.     Vitamin E 400 units TABS Take 400 Units by mouth daily.      No facility-administered medications prior to visit.    Review of Systems  Review of Systems  Constitutional:  Positive for unexpected weight change.  Respiratory:  Positive for shortness of breath. Negative for cough and wheezing.   Gastrointestinal:  Positive for abdominal distention.    Physical Exam  BP 106/70 (BP  Location: Right Arm, Patient Position: Sitting, Cuff Size: Normal)    Pulse 77    Temp 98.3 F (36.8 C) (Oral)    Ht _0  (1.727 m)    Wt 245 lb 6.4 oz (111.3 kg)    SpO2 97%    BMI 37.31 kg/m  Physical Exam Constitutional:      Appearance: Normal appearance.  HENT:     Head:  Normocephalic and atraumatic.  Cardiovascular:     Rate and Rhythm: Normal rate.  Pulmonary:     Effort: Pulmonary effort is normal.     Breath sounds: Normal breath sounds. No wheezing or rales.  Abdominal:     Comments: Large abd/swelling  Musculoskeletal:        General: Normal range of motion.     Cervical back: Normal range of motion and neck supple.  Skin:    General: Skin is warm and dry.  Neurological:     General: No focal deficit present.     Mental Status: He is alert and oriented to person, place, and time. Mental status is at baseline.  Psychiatric:        Mood and Affect: Mood normal.        Behavior: Behavior normal.        Thought Content: Thought content normal.        Judgment: Judgment normal.     Lab Results:  CBC    Component Value Date/Time   WBC 8.5 11/19/2021 0835   WBC 13.8 (H) 08/31/2021 1052   RBC 4.41 11/19/2021 0835   RBC 4.01 (L) 08/31/2021 1052   HGB 11.1 (L) 11/19/2021 0835   HCT 36.4 (L) 11/19/2021 0835   PLT 205 11/19/2021 0835   MCV 83 11/19/2021 0835   MCV 92 02/28/2014 1845   MCH 25.2 (L) 11/19/2021 0835   MCH 26.2 08/31/2021 1052   MCHC 30.5 (L) 11/19/2021 0835   MCHC 30.9 08/31/2021 1052   RDW 15.7 (H) 11/19/2021 0835   RDW 13.6 02/28/2014 1845   LYMPHSABS 2.7 11/19/2021 0835   LYMPHSABS 2.6 02/28/2014 1845   MONOABS 1.1 (H) 08/31/2021 1052   MONOABS 0.9 02/28/2014 1845   EOSABS 0.4 11/19/2021 0835   EOSABS 0.1 02/28/2014 1845   BASOSABS 0.1 11/19/2021 0835   BASOSABS 0.0 02/28/2014 1845    BMET    Component Value Date/Time   NA 135 12/19/2021 1540   NA 137 10/17/2021 1105   NA 136 02/28/2014 1845   K 5.1 12/19/2021 1540   K 4.0 02/28/2014 1845   CL 102 12/19/2021 1540   CL 104 02/28/2014 1845   CO2 22 12/19/2021 1540   CO2 26 02/28/2014 1845   GLUCOSE 215 (H) 12/19/2021 1540   GLUCOSE 105 (H) 02/28/2014 1845   BUN 23 12/19/2021 1540   BUN 26 10/17/2021 1105   BUN 13 02/28/2014 1845   CREATININE 1.10  12/19/2021 1540   CALCIUM 9.7 12/19/2021 1540   CALCIUM 8.9 02/28/2014 1845   GFRNONAA >60 08/31/2021 1052   GFRNONAA >60 02/28/2014 1845   GFRAA 75 12/06/2020 1554   GFRAA >60 02/28/2014 1845    BNP    Component Value Date/Time   BNP 11 12/19/2021 1540    ProBNP No results found for: PROBNP  Imaging: DG Chest 2 View  Result Date: 12/19/2021 CLINICAL DATA:  Shortness of breath, weight gain EXAM: CHEST - 2 VIEW COMPARISON:  None. FINDINGS: The heart size and mediastinal contours are within normal limits. There is small calcific  density in the right mid lung fields, possibly a granuloma with no significant change. Both lungs are clear. The visualized skeletal structures are unremarkable. IMPRESSION: No active cardiopulmonary disease. Electronically Signed   By: Elmer Picker M.D.   On: 12/19/2021 16:02     Assessment & Plan:   Emphysema lung ([place]) - Wheezing resolved on Stiolto Respimat, continue maintenance inhaler as prescribed   Sleep apnea - Patient reports compliance with CPAP use, compliance report not available. He is having difficulty tolerating and feels short of breath with use. Ordering CPAP titration study.   Shortness of breath - COPD does not appear acutely exacerbated during todays visit.  He saw no improvement in dyspnea symptoms after resuming Stiolto except for having less wheezing. He reports weight gain and abdominal swelling. He has been on diuretics before. Checking CXR and BMET/BNP. Started patient on lasix 41m BID x 5 days.   40 mins spent on cae: > 50% face to face with patient  EMartyn Ehrich NP 12/22/2021

## 2021-12-19 NOTE — Patient Instructions (Addendum)
Recommendation: ?Start lasix 20mg  daily twice daily x 5 days ?Continue Stiolto two puffs in the morning  ? ?Orders: ?Labs (BNP and BMET) ?CXR re: shortness of breath ?CPAP titration study re: OSA  ? ?Follow-up: ?2 weeks with Eustaquio Maize NP  ? ?

## 2021-12-20 LAB — BASIC METABOLIC PANEL
BUN: 23 mg/dL (ref 7–25)
CO2: 22 mmol/L (ref 20–32)
Calcium: 9.7 mg/dL (ref 8.6–10.3)
Chloride: 102 mmol/L (ref 98–110)
Creat: 1.1 mg/dL (ref 0.70–1.35)
Glucose, Bld: 215 mg/dL — ABNORMAL HIGH (ref 65–99)
Potassium: 5.1 mmol/L (ref 3.5–5.3)
Sodium: 135 mmol/L (ref 135–146)

## 2021-12-20 LAB — BRAIN NATRIURETIC PEPTIDE: Brain Natriuretic Peptide: 11 pg/mL (ref <100)

## 2021-12-22 DIAGNOSIS — E1142 Type 2 diabetes mellitus with diabetic polyneuropathy: Secondary | ICD-10-CM | POA: Diagnosis not present

## 2021-12-22 DIAGNOSIS — M5136 Other intervertebral disc degeneration, lumbar region: Secondary | ICD-10-CM | POA: Diagnosis not present

## 2021-12-22 DIAGNOSIS — G4700 Insomnia, unspecified: Secondary | ICD-10-CM | POA: Diagnosis not present

## 2021-12-22 DIAGNOSIS — M47897 Other spondylosis, lumbosacral region: Secondary | ICD-10-CM | POA: Diagnosis not present

## 2021-12-22 DIAGNOSIS — M9951 Intervertebral disc stenosis of neural canal of cervical region: Secondary | ICD-10-CM | POA: Diagnosis not present

## 2021-12-22 DIAGNOSIS — M545 Low back pain, unspecified: Secondary | ICD-10-CM | POA: Diagnosis not present

## 2021-12-22 DIAGNOSIS — R609 Edema, unspecified: Secondary | ICD-10-CM | POA: Diagnosis not present

## 2021-12-22 DIAGNOSIS — G8929 Other chronic pain: Secondary | ICD-10-CM | POA: Diagnosis not present

## 2021-12-22 DIAGNOSIS — M25559 Pain in unspecified hip: Secondary | ICD-10-CM | POA: Diagnosis not present

## 2021-12-22 DIAGNOSIS — M48062 Spinal stenosis, lumbar region with neurogenic claudication: Secondary | ICD-10-CM | POA: Diagnosis not present

## 2021-12-22 DIAGNOSIS — M792 Neuralgia and neuritis, unspecified: Secondary | ICD-10-CM | POA: Diagnosis not present

## 2021-12-22 DIAGNOSIS — G894 Chronic pain syndrome: Secondary | ICD-10-CM | POA: Diagnosis not present

## 2021-12-22 DIAGNOSIS — M25519 Pain in unspecified shoulder: Secondary | ICD-10-CM | POA: Diagnosis not present

## 2021-12-22 NOTE — Assessment & Plan Note (Addendum)
-   Patient reports compliance with CPAP use, compliance report not available. He is having difficulty tolerating and feels short of breath with use. Ordering CPAP titration study.  ?

## 2021-12-22 NOTE — Assessment & Plan Note (Addendum)
-   COPD does not appear acutely exacerbated during todays visit.  He saw no improvement in dyspnea symptoms after resuming Stiolto except for having less wheezing. He reports weight gain and abdominal swelling. He has been on diuretics before. Checking CXR and BMET/BNP. Started patient on lasix '20mg'$  BID x 5 days.  ?

## 2021-12-22 NOTE — Assessment & Plan Note (Signed)
-   Wheezing resolved on Stiolto Respimat, continue maintenance inhaler as prescribed  ?

## 2021-12-22 NOTE — Progress Notes (Signed)
Please let patient know his labs were wnl. Cont lasix as diuretic for 5 days. Follow-up in 2 weeks. Needs cpap titration that I ordered

## 2021-12-23 DIAGNOSIS — L281 Prurigo nodularis: Secondary | ICD-10-CM | POA: Diagnosis not present

## 2021-12-23 DIAGNOSIS — L853 Xerosis cutis: Secondary | ICD-10-CM | POA: Diagnosis not present

## 2021-12-24 ENCOUNTER — Ambulatory Visit: Payer: Self-pay

## 2021-12-24 NOTE — Telephone Encounter (Signed)
Called and spoke to patient. Scheduled to see Jolene tomorrow morning.  ?

## 2021-12-24 NOTE — Telephone Encounter (Signed)
?  Chief Complaint: hernia pain ?Symptoms: R sided hernia pain 10/10 that comes and goes. Pt states pain is started to improve during call ?Frequency: <1 hr today ?Pertinent Negatives: Patient denies N/V/D ?Disposition: '[]'$ ED /'[]'$ Urgent Care (no appt availability in office) / '[]'$ Appointment(In office/virtual)/ '[]'$  [facility]/ '[]'$ Home Care/ '[]'$ Refused Recommended Disposition /'[]'$ [facility]/ '[x]'$  Follow-up with PCP ?Additional Notes: Pt didn't want to come in for an appt he wanted to let Jolene know he is ready to schedule surgery at this point and has appt on 12/31/21. He states he will be going out of town Friday -Monday. Wants f/up to see what is recommended for surgery.  ? ? ?Reason for Disposition ? [1] SEVERE pain AND [2] present < 1 hour ? ?Answer Assessment - Initial Assessment Questions ?1. LOCATION: "Where does it hurt?"  ?    R side near rib cage ?2. RADIATION: "Does the pain shoot anywhere else?" (e.g., chest, back) ?    no ?3. ONSET: "When did the pain begin?" (Minutes, hours or days ago)  ?    Started few hours ago  ?4. SUDDEN: "Gradual or sudden onset?" ?    Gradual  ?5. PATTERN "Does the pain come and go, or is it constant?" ?   - If constant: "Is it getting better, staying the same, or worsening?"  ?    (Note: Constant means the pain never goes away completely; most serious pain is constant and it progresses)  ?   - If intermittent: "How long does it last?" "Do you have pain now?" ?    (Note: Intermittent means the pain goes away completely between bouts) ?    constant ?6. SEVERITY: "How bad is the pain?"  (e.g., Scale 1-10; mild, moderate, or severe) ?   - MILD (1-3): doesn't interfere with normal activities, abdomen soft and not tender to touch  ?   - MODERATE (4-7): interferes with normal activities or awakens from sleep, abdomen tender to touch  ?   - SEVERE (8-10): excruciating pain, doubled over, unable to do any normal activities   ?    10 ?8. CAUSE: "What do you think is  causing the stomach pain?" ?    Hernia pain  ?10. OTHER SYMPTOMS: "Do you have any other symptoms?" (e.g., back pain, diarrhea, fever, urination pain, vomiting) ?      No ? ?Protocols used: Abdominal Pain - Male-A-AH ? ?

## 2021-12-25 ENCOUNTER — Encounter: Payer: Self-pay | Admitting: Nurse Practitioner

## 2021-12-25 ENCOUNTER — Ambulatory Visit: Payer: Medicare Other | Admitting: Internal Medicine

## 2021-12-25 ENCOUNTER — Ambulatory Visit (INDEPENDENT_AMBULATORY_CARE_PROVIDER_SITE_OTHER): Payer: Medicare Other | Admitting: Nurse Practitioner

## 2021-12-25 ENCOUNTER — Ambulatory Visit
Admission: RE | Admit: 2021-12-25 | Discharge: 2021-12-25 | Disposition: A | Discharge status: Home / Self Care | Payer: Medicare Other | Source: Ambulatory Visit | Attending: Nurse Practitioner | Admitting: Nurse Practitioner

## 2021-12-25 ENCOUNTER — Other Ambulatory Visit: Payer: Self-pay

## 2021-12-25 VITALS — BP 109/72 | HR 98 | Temp 97.8°F | Ht 68.0 in | Wt 241.6 lb

## 2021-12-25 DIAGNOSIS — R5383 Other fatigue: Secondary | ICD-10-CM | POA: Diagnosis not present

## 2021-12-25 DIAGNOSIS — R1084 Generalized abdominal pain: Secondary | ICD-10-CM

## 2021-12-25 DIAGNOSIS — R109 Unspecified abdominal pain: Secondary | ICD-10-CM | POA: Diagnosis not present

## 2021-12-25 DIAGNOSIS — I700 Atherosclerosis of aorta: Secondary | ICD-10-CM | POA: Diagnosis not present

## 2021-12-25 MED ORDER — IOHEXOL 300 MG/ML  SOLN
100.0000 mL | Freq: Once | INTRAMUSCULAR | Status: AC | PRN
  Administered 2021-12-25: 18:00:00 100 mL via INTRAVENOUS

## 2021-12-25 NOTE — Assessment & Plan Note (Signed)
Acute since yesterday, significantly tender on exam.  Obtain labs CBC, CMP, TSH.  STAT abdominal CT ordered to further assess.  Will determine next steps after imaging reviewed, he is headed to obtain at this time.  May need to send to ER for further evaluation if abnormal findings.  Referral to general surgery per request.  Return as scheduled on 12/31/21. ?

## 2021-12-25 NOTE — Patient Instructions (Signed)
Abdominal Pain, Adult Many things can cause belly (abdominal) pain. Most times, belly pain is not dangerous. Many cases of belly pain can be watched and treated at home. Sometimes, though, belly pain is serious. Yourdoctor will try to find the cause of your belly pain. Follow these instructions at home:  Medicines Take over-the-counter and prescription medicines only as told by your doctor. Do not take medicines that help you poop (laxatives) unless told by your doctor. General instructions Watch your belly pain for any changes. Drink enough fluid to keep your pee (urine) pale yellow. Keep all follow-up visits as told by your doctor. This is important. Contact a doctor if: Your belly pain changes or gets worse. You are not hungry, or you lose weight without trying. You are having trouble pooping (constipated) or have watery poop (diarrhea) for more than 2-3 days. You have pain when you pee or poop. Your belly pain wakes you up at night. Your pain gets worse with meals, after eating, or with certain foods. You are vomiting and cannot keep anything down. You have a fever. You have blood in your pee. Get help right away if: Your pain does not go away as soon as your doctor says it should. You cannot stop vomiting. Your pain is only in areas of your belly, such as the right side or the left lower part of the belly. You have bloody or black poop, or poop that looks like tar. You have very bad pain, cramping, or bloating in your belly. You have signs of not having enough fluid or water in your body (dehydration), such as: Dark pee, very little pee, or no pee. Cracked lips. Dry mouth. Sunken eyes. Sleepiness. Weakness. You have trouble breathing or chest pain. Summary Many cases of belly pain can be watched and treated at home. Watch your belly pain for any changes. Take over-the-counter and prescription medicines only as told by your doctor. Contact a doctor if your belly pain  changes or gets worse. Get help right away if you have very bad pain, cramping, or bloating in your belly. This information is not intended to replace advice given to you by your health care provider. Make sure you discuss any questions you have with your healthcare provider. Document Revised: 02/13/2019 Document Reviewed: 02/13/2019 Elsevier Patient Education  2022 Elsevier Inc.  

## 2021-12-25 NOTE — Progress Notes (Signed)
? ?BP 109/72   Pulse 98 Comment: apical  Temp 97.8 ?F (36.6 ?C) (Oral)   Ht '5\' 8"'$  (1.727 m)   Wt 241 lb 9.6 oz (109.6 kg)   SpO2 97%   BMI 36.74 kg/m?   ? ?Subjective:  ? ? Patient ID: Edward Petrosyan., male    DOB: 1956-03-22, 66 y.o.   MRN: [id number] ? ?HPI: ?Edward Lanum. is a 66 y.o. male ? ?Chief Complaint  ?Patient presents with  ? Pain  ?  Patient states he has 2 hernias and he is having some pain in his abdomen. Patient states he has one on each side and says they are moving. Patient states he is ready to discuss moving forward with surgery to have hernia repaired.   ? ?ABDOMINAL PAIN  ?Was having a whole lot of pain in belly yesterday, went to bed at 5:30 pm due to pain, it is a little better today -- is still sore.  He would like to go to general surgery and get hernia repaired.   ? ?Last CT abdomen done 04/02/21 noting stable small fat containing umbilical hernia. ?Duration:days ?Onset: sudden ?Severity: 25/10 ?Quality: knife-like and sharp yesterday ?Location:  diffuse to to left side and midline  ?Episode duration: all day -- 24 hours ?Radiation: no ?Frequency: constant ?Alleviating factors: as below ?Aggravating factors: ?Status: fluctuating ?Treatments attempted: Percocet and NTG -- helped ease off for short period ?Fever: no ?Nausea: no ?Vomiting: no ?Weight loss: no ?Decreased appetite: yes -- ate two [employer] and this is when he started hurting ?Diarrhea: no -- did use bathroom a lot yesterday thought, felt like it would not stop ?Constipation: no ?Blood in stool: when wipes only ?Heartburn: no ?Jaundice: no ?Rash: no ?Dysuria/urinary frequency: no ?Hematuria: no ?History of sexually transmitted disease: no ?Recurrent NSAID use: no  ? ?Relevant past medical, surgical, family and social history reviewed and updated as indicated. Interim medical history since our last visit reviewed. ?Allergies and medications reviewed and updated. ? ?Review of Systems  ?Constitutional: Negative.    ?Respiratory:  Negative for apnea, cough, chest tightness, shortness of breath and wheezing.   ?Cardiovascular:  Negative for chest pain, palpitations and leg swelling.  ?Gastrointestinal:  Positive for abdominal pain. Negative for abdominal distention, constipation, diarrhea, nausea and vomiting.  ?Endocrine: Negative.   ?Neurological: Negative.   ?Psychiatric/Behavioral: Negative.    ? ?Per HPI unless specifically indicated above ? ?   ?Objective:  ?  ?BP 109/72   Pulse 98 Comment: apical  Temp 97.8 ?F (36.6 ?C) (Oral)   Ht '5\' 8"'$  (1.727 m)   Wt 241 lb 9.6 oz (109.6 kg)   SpO2 97%   BMI 36.74 kg/m?   ?Wt Readings from Last 3 Encounters:  ?12/25/21 241 lb 9.6 oz (109.6 kg)  ?12/19/21 245 lb 6.4 oz (111.3 kg)  ?12/12/21 247 lb 3.2 oz (112.1 kg)  ?  ?Physical Exam ?Vitals and nursing note reviewed.  ?Constitutional:   ?   General: He is awake. He is not in acute distress. ?   Appearance: He is well-developed and well-groomed. He is obese. He is not ill-appearing or toxic-appearing.  ?HENT:  ?   Head: Normocephalic and atraumatic.  ?   Right Ear: Hearing normal. No drainage.  ?   Left Ear: Hearing normal. No drainage.  ?Eyes:  ?   General: Lids are normal.     ?   Right eye: No discharge.     ?  Left eye: No discharge.  ?   Conjunctiva/sclera: Conjunctivae normal.  ?   Pupils: Pupils are equal, round, and reactive to light.  ?Neck:  ?   Thyroid: No thyromegaly.  ?   Vascular: No carotid bruit.  ?   Trachea: Trachea normal.  ?Cardiovascular:  ?   Rate and Rhythm: Normal rate and regular rhythm.  ?   Heart sounds: Normal heart sounds, S1 normal and S2 normal. No murmur heard. ?  No gallop.  ?Pulmonary:  ?   Effort: Pulmonary effort is normal. No accessory muscle usage or respiratory distress.  ?   Breath sounds: Normal breath sounds.  ?Abdominal:  ?   General: Bowel sounds are normal. There is no distension.  ?   Palpations: Abdomen is soft.  ?   Tenderness: There is generalized abdominal tenderness. There is no  right CVA tenderness, left CVA tenderness, guarding or rebound. Negative signs include Murphy's sign.  ?   Hernia: A hernia is present. Hernia is present in the umbilical area and ventral area.  ?   Comments: Exquisite tenderness on palpation, mainly to left side and midline aspect. No abdominal bruising.  ?Musculoskeletal:     ?   General: Normal range of motion.  ?   Cervical back: Normal range of motion and neck supple.  ?   Right lower leg: No edema.  ?   Left lower leg: No edema.  ?Skin: ?   General: Skin is warm and dry.  ?   Capillary Refill: Capillary refill takes less than 2 seconds.  ?   Findings: No rash.  ?   Comments: Scattered pale bruising to upper extremities and abrasions -- baseline.    ?Neurological:  ?   Mental Status: He is alert and oriented to person, place, and time.  ?   Motor: Motor function is intact.  ?   Coordination: Coordination is intact.  ?   Gait: Gait is intact.  ?   Deep Tendon Reflexes: Reflexes are normal and symmetric.  ?Psychiatric:     ?   Attention and Perception: Attention normal.     ?   Mood and Affect: Mood normal.     ?   Behavior: Behavior normal. Behavior is cooperative.     ?   Thought Content: Thought content normal.  ? ? ?Results for orders placed or performed in visit on 12/19/21  ?Brain natriuretic peptide  ?Result Value Ref Range  ? Brain Natriuretic Peptide 11 <100 pg/mL  ?Basic metabolic panel  ?Result Value Ref Range  ? Glucose, Bld 215 (H) 65 - 99 mg/dL  ? BUN 23 7 - 25 mg/dL  ? Creat 1.10 0.70 - 1.35 mg/dL  ? BUN/Creatinine Ratio NOT APPLICABLE 6 - 22 (calc)  ? Sodium 135 135 - 146 mmol/L  ? Potassium 5.1 3.5 - 5.3 mmol/L  ? Chloride 102 98 - 110 mmol/L  ? CO2 22 20 - 32 mmol/L  ? Calcium 9.7 8.6 - 10.3 mg/dL  ? ?   ?Assessment & Plan:  ? ?Problem List Items Addressed This Visit   ? ?  ? Other  ? Generalized abdominal pain - Primary  ?  Acute since yesterday, significantly tender on exam.  Obtain labs CBC, CMP, TSH.  STAT abdominal CT ordered to further  assess.  Will determine next steps after imaging reviewed, he is headed to obtain at this time.  May need to send to ER for further evaluation if abnormal findings.  Referral to  general surgery per request.  Return as scheduled on 12/31/21. ?  ?  ? Relevant Orders  ? CBC with Differential/Platelet  ? Comprehensive metabolic panel  ? TSH  ? CT Abdomen Pelvis W Contrast  ? Ambulatory referral to General Surgery  ?  ? ?Follow up plan: ?Return for as scheduled on 12/31/21. ? ? ? ? ? ?

## 2021-12-25 NOTE — Progress Notes (Signed)
Good morning, please let Edward Schneider know that CT abdomen returned noting no acute finding to explain his left-sided pain.  He continues to have a small umbilical hernia, but this is unchanged from previous scan.  I have placed referral to general surgery to discuss further if intervention is needed for this.  Have a great day!!  Watch diet and avoid [employer] sandwiches with the sauce. ?Keep being awesome!!  Thank you for allowing me to participate in your care.  I appreciate you. ?Kindest regards, ?Korry Dalgleish ?

## 2021-12-26 LAB — CBC WITH DIFFERENTIAL/PLATELET
Basophils Absolute: 0.1 10*3/uL (ref 0.0–0.2)
Basos: 1 %
EOS (ABSOLUTE): 0.3 10*3/uL (ref 0.0–0.4)
Eos: 2 %
Hematocrit: 39.7 % (ref 37.5–51.0)
Hemoglobin: 12.7 g/dL — ABNORMAL LOW (ref 13.0–17.7)
Immature Grans (Abs): 0.2 10*3/uL — ABNORMAL HIGH (ref 0.0–0.1)
Immature Granulocytes: 1 %
Lymphocytes Absolute: 3.2 10*3/uL — ABNORMAL HIGH (ref 0.7–3.1)
Lymphs: 22 %
MCH: 26.3 pg — ABNORMAL LOW (ref 26.6–33.0)
MCHC: 32 g/dL (ref 31.5–35.7)
MCV: 82 fL (ref 79–97)
Monocytes Absolute: 1.1 10*3/uL — ABNORMAL HIGH (ref 0.1–0.9)
Monocytes: 7 %
Neutrophils Absolute: 9.8 10*3/uL — ABNORMAL HIGH (ref 1.4–7.0)
Neutrophils: 67 %
Platelets: 258 10*3/uL (ref 150–450)
RBC: 4.83 x10E6/uL (ref 4.14–5.80)
RDW: 16.8 % — ABNORMAL HIGH (ref 11.6–15.4)
WBC: 14.6 10*3/uL — ABNORMAL HIGH (ref 3.4–10.8)

## 2021-12-26 LAB — COMPREHENSIVE METABOLIC PANEL
ALT: 38 IU/L (ref 0–44)
AST: 29 IU/L (ref 0–40)
Albumin/Globulin Ratio: 1.7 (ref 1.2–2.2)
Albumin: 4.5 g/dL (ref 3.8–4.8)
Alkaline Phosphatase: 73 IU/L (ref 44–121)
BUN/Creatinine Ratio: 20 (ref 10–24)
BUN: 24 mg/dL (ref 8–27)
Bilirubin Total: 0.6 mg/dL (ref 0.0–1.2)
CO2: 24 mmol/L (ref 20–29)
Calcium: 10.2 mg/dL (ref 8.6–10.2)
Chloride: 98 mmol/L (ref 96–106)
Creatinine, Ser: 1.2 mg/dL (ref 0.76–1.27)
Globulin, Total: 2.7 g/dL (ref 1.5–4.5)
Glucose: 145 mg/dL — ABNORMAL HIGH (ref 70–99)
Potassium: 4.7 mmol/L (ref 3.5–5.2)
Sodium: 138 mmol/L (ref 134–144)
Total Protein: 7.2 g/dL (ref 6.0–8.5)
eGFR: 68 mL/min/{1.73_m2} (ref >59)

## 2021-12-26 LAB — TSH: TSH: 1.49 u[IU]/mL (ref 0.450–4.500)

## 2021-12-26 MED ORDER — AMOXICILLIN-POT CLAVULANATE 875-125 MG PO TABS: 1.0000 | ORAL_TABLET | Freq: Two times a day (BID) | ORAL | 0 refills | Status: AC

## 2021-12-26 NOTE — Progress Notes (Signed)
Good day, please let Edward Schneider know his labs have returned: ?- White blood cell count is elevated, as are neutrophils.  I am concerned about infection with this and will be sending in Augmentin for you to start, especially with this abdominal pain.  We will recheck this on 15th at visit. ?- Kidney function, creatinine and eGFR, remains normal, as is liver function, AST and ALT.   ?- Thyroid level normal.  Any questions? ?Keep being amazing!!  Thank you for allowing me to participate in your care.  I appreciate you. ?Kindest regards, ?Karyn Brull ?

## 2021-12-26 NOTE — Addendum Note (Signed)
Addended by: Marnee Guarneri T on: 12/26/2021 05:08 PM ? ? Modules accepted: Orders ? ?

## 2021-12-30 ENCOUNTER — Other Ambulatory Visit: Payer: Self-pay

## 2021-12-30 ENCOUNTER — Ambulatory Visit (INDEPENDENT_AMBULATORY_CARE_PROVIDER_SITE_OTHER): Payer: Medicare Other | Admitting: Surgery

## 2021-12-30 ENCOUNTER — Encounter: Payer: Self-pay | Admitting: Surgery

## 2021-12-30 VITALS — BP 117/72 | HR 111 | Temp 97.7°F | Ht 68.0 in | Wt 248.4 lb

## 2021-12-30 DIAGNOSIS — R1011 Right upper quadrant pain: Secondary | ICD-10-CM

## 2021-12-30 DIAGNOSIS — R109 Unspecified abdominal pain: Secondary | ICD-10-CM

## 2021-12-30 DIAGNOSIS — K429 Umbilical hernia without obstruction or gangrene: Secondary | ICD-10-CM | POA: Diagnosis not present

## 2021-12-30 NOTE — Patient Instructions (Addendum)
Your Ultrasound is scheduled for 01/06/2022 @ 9 am (arrive at 8:30 am) at [facility]. Nothing to eat or drink after midnight. ? ?Avoid fat, fried, and greasy foods. ? ?If you have any concerns or questions, please feel free to call our office.  ? ?Umbilical Hernia, Adult ?A hernia is a bulge of tissue that pushes through an opening between muscles. An umbilical hernia happens in the abdomen, near the belly button (umbilicus). The hernia may contain tissues from the small intestine, large intestine, or fatty tissue covering the intestines. Umbilical hernias in adults tend to get worse over time, and they require surgical treatment. ?There are different types of umbilical hernias, including: ?Indirect hernia. This type is located just above or below the umbilicus. It is the most common type of umbilical hernia in adults. ?Direct hernia. This type forms through an opening formed by the umbilicus. ?Reducible hernia. This type of hernia comes and goes. It may be visible only when you strain, lift something heavy, or cough. This type of hernia can be pushed back into the abdomen (reduced). ?Incarcerated hernia. This type traps abdominal tissue inside the hernia. This type of hernia cannot be reduced. ?Strangulated hernia. This type of hernia cuts off blood flow to the tissues inside the hernia. The tissues can start to die if this happens. This type of hernia requires emergency treatment. ?What are the causes? ?An umbilical hernia happens when tissue inside the abdomen presses on a weak area of the abdominal muscles. ?What increases the risk? ?You may have a greater risk of this condition if you: ?Are obese. ?Have had several pregnancies. ?Have a buildup of fluid inside your abdomen. ?Have had surgery that weakens the abdominal muscles. ?What are the signs or symptoms? ?The main symptom of this condition is a painless bulge at or near the belly button. ?A reducible hernia may be visible only when you strain, lift  something heavy, or cough. Other symptoms may include: ?Dull pain. ?A feeling of pressure. ?Symptoms of a strangulated hernia may include: ?Pain that gets increasingly worse. ?Nausea and vomiting. ?Pain when pressing on the hernia. ?Skin over the hernia becoming red or purple. ?Constipation. ?Blood in the stool. ?How is this diagnosed? ?This condition may be diagnosed based on: ?A physical exam. You may be asked to cough or strain while standing. These actions increase the pressure inside your abdomen and can force the hernia through the opening in your muscles. Your health care provider may try to reduce the hernia by pressing on it. ?Your symptoms and medical history. ?How is this treated? ?Surgery is the only treatment for an umbilical hernia. Surgery for a strangulated hernia is done as soon as possible. If you have a small hernia that is not incarcerated, you may need to lose weight before having surgery. ?Follow these instructions at home: ?Lose weight, if told by your health care provider. ?Do not try to push the hernia back in. ?Watch your hernia for any changes in color or size. Tell your health care provider if any changes occur. ?You may need to avoid activities that increase pressure on your hernia. ?Do not lift anything that is heavier than 10 lb (4.5 kg), or the limit that you are told, until your health care provider says that it is safe. ?Take over-the-counter and prescription medicines only as told by your health care provider. ?Keep all follow-up visits. This is important. ?Contact a health care provider if: ?Your hernia gets larger. ?Your hernia becomes painful. ?Get help  right away if: ?You develop sudden, severe pain near the area of your hernia. ?You have pain as well as nausea or vomiting. ?You have pain and the skin over your hernia changes color. ?You develop a fever or chills. ?Summary ?A hernia is a bulge of tissue that pushes through an opening between muscles. An umbilical hernia happens  near the belly button. ?Surgery is the only treatment for an umbilical hernia. ?Do not try to push your hernia back in. ?Keep all follow-up visits. This is important. ?This information is not intended to replace advice given to you by your health care provider. Make sure you discuss any questions you have with your health care provider. ?Document Revised: 05/13/2020 Document Reviewed: 05/13/2020 ?Elsevier Patient Education ? [place]. ? ?

## 2021-12-30 NOTE — Progress Notes (Signed)
Patient ID: Edward Daleo., male   DOB: 1955/11/17, 66 y.o.   MRN: [id number] ? ?Chief Complaint: Abdominal pain ? ?History of Present Illness ?Edward Schneider. is a 66 y.o. male with referral for umbilical hernia.  Reports that hernias been present for 3 years, was evaluated 5 days ago by CT.  He reports his pain is an 11 out of 10.  He reports his pain is most severe after eating.  He denies weight loss, reporting he is only eating grapefruit. ?Upon further inquiry, he admits to having more severe pain after for example, breakfast this morning where he had fried eggs and sausage.  The pain does not appear to localize anywhere else but across the mid abdomen.  He has not had any prior work-up for gallstones.  He reports 4 bowel movements daily, being current with his colonic screening. ? ?Past Medical History ?Past Medical History:  ?Diagnosis Date  ? Allergy   ? Aortic atherosclerosis ([place])   ? Asthma   ? C. difficile diarrhea   ? Chronic pain   ? Collagenous colitis   ? Coronary artery disease   ? a.) PCI with 2.75 x 18 mm Resolute Onyx DES x 1 to prox/mid LAD on 09/05/2019  ? DDD (degenerative disc disease), cervical   ? DDD (degenerative disc disease), lumbar   ? GERD (gastroesophageal reflux disease)   ? Grade I diastolic dysfunction   ? Hepatic steatosis   ? Hyperlipidemia   ? Hypertension   ? Liver cancer ([place]) 03/2015  ? Migraines   ? Myocardial infarction [facility])    ? OSA on CPAP   ? Seizures ([place])   ? several as child when sick.  None since age 47  ? Stroke [facility])   ? 'mini-stroke" 30 yrs ago. no deficits.  ? T2DM (type 2 diabetes mellitus) ([place])   ? Wears dentures   ? full upper and lower  ?  ? ? ?Past Surgical History:  ?Procedure Laterality Date  ? APPENDECTOMY    ? BACK SURGERY    ? CARDIAC CATHETERIZATION    ? No stent placed in his "30's"  ? CERVICAL FUSION    ? COLONOSCOPY WITH PROPOFOL N/A 03/06/2016  ? Procedure: COLONOSCOPY WITH PROPOFOL;  Surgeon: Lucilla Lame, MD;  Location: [facility];   Service: Endoscopy;  Laterality: N/A;  requests early  ? COLONOSCOPY WITH PROPOFOL N/A 06/18/2020  ? Procedure: COLONOSCOPY WITH PROPOFOL;  Surgeon: Lucilla Lame, MD;  Location: [facility] ENDOSCOPY;  Service: Endoscopy;  Laterality: N/A;  ? ESOPHAGOGASTRODUODENOSCOPY (EGD) WITH PROPOFOL N/A 09/20/2017  ? Procedure: ESOPHAGOGASTRODUODENOSCOPY (EGD) WITH PROPOFOL;  Surgeon: Lucilla Lame, MD;  Location: [place];  Service: Endoscopy;  Laterality: N/A;  Diabetic - oral meds  ? FINGER SURGERY Left   ? INTRAVASCULAR PRESSURE WIRE/FFR STUDY N/A 09/05/2019  ? Procedure: INTRAVASCULAR PRESSURE WIRE/FFR STUDY;  Surgeon: Nelva Bush, MD;  Location: [place] CV LAB;  Service: Cardiovascular;  Laterality: N/A;  ? KNEE SURGERY Right   ? LEFT HEART CATH AND CORONARY ANGIOGRAPHY Left 09/05/2019  ? Procedure: LEFT HEART CATH AND CORONARY ANGIOGRAPHY (2.75 x 18 mm Resolute Onyx DES x 1 to prox/mid LAD);  Surgeon: Nelva Bush, MD;  Location: [place] CV LAB;  Service: Cardiovascular;  Laterality: Left;  ? NECK SURGERY    ? spleen surgery    ? TOE SURGERY Right   ? TOTAL HIP ARTHROPLASTY Right 06/03/2021  ? Procedure: TOTAL HIP ARTHROPLASTY ANTERIOR APPROACH;  Surgeon: Hessie Knows,  MD;  Location: [facility] ORS;  Service: Orthopedics;  Laterality: Right;  ? TOTAL KNEE ARTHROPLASTY Right 08/22/2021  ? Procedure: TOTAL KNEE ARTHROPLASTY;  Surgeon: Hessie Knows, MD;  Location: [facility] ORS;  Service: Orthopedics;  Laterality: Right;  ? ? ?Allergies  ?Allergen Reactions  ? Bee Venom Anaphylaxis  ? Crestor [Rosuvastatin Calcium] Shortness Of Breath and Swelling  ? Fentanyl Itching and Hives  ?  blisters ?Patch  ? Gabapentin Diarrhea  ?  Severe diarrhea which caused incontinence, loss of appetite and weight loss.  ? Shellfish Allergy Anaphylaxis and Swelling  ?  Shrimp causes throat to swell and tingling in tongue.   ? Furosemide Nausea And Vomiting  ? Buprenorphine Hcl Itching  ? Chlorhexidine Gluconate Itching and Rash  ?  Simvastatin Diarrhea  ? ? ?Current Outpatient Medications  ?Medication Sig Dispense Refill  ? acetaminophen (TYLENOL) 650 MG CR tablet Take 1,300 mg by mouth every 8 (eight) hours.    ? albuterol (VENTOLIN HFA) 108 (90 Base) MCG/ACT inhaler Inhale 2 puffs into the lungs every 6 (six) hours as needed for wheezing or shortness of breath. 16 g 3  ? amoxicillin-clavulanate (AUGMENTIN) 875-125 MG tablet Take 1 tablet by mouth 2 (two) times daily for 7 days. 14 tablet 0  ? Ascorbic Acid (VITAMIN C) 1000 MG tablet Take 1,000 mg by mouth daily.     ? aspirin EC 81 MG tablet Take 81 mg by mouth daily. Swallow whole.    ? atorvastatin (LIPITOR) 80 MG tablet TAKE 1 TABLET BY MOUTH AT BEDTIME 90 tablet 2  ? benazepril (LOTENSIN) 10 MG tablet Take 1 tablet by mouth once daily 90 tablet 0  ? calcium carbonate (OS-CAL) 600 MG TABS tablet Take 600 mg by mouth daily.     ? cetirizine (ZYRTEC) 10 MG tablet Take 1 tablet (10 mg total) by mouth daily. 90 tablet 1  ? Cholecalciferol (VITAMIN D3) 25 MCG (1000 UT) CHEW Chew 1,000 Units by mouth daily.     ? CINNAMON PO Take 1,000 mg by mouth 2 (two) times daily.     ? clobetasol cream (TEMOVATE) 4.40 % Apply 1 application topically 2 (two) times daily. 30 g 0  ? Continuous Blood Gluc Sensor (FREESTYLE LIBRE 2 SENSOR) MISC Use as instructed to check blood sugar daily 2 each 2  ? cyclobenzaprine (FLEXERIL) 10 MG tablet Take 1 tablet (10 mg total) by mouth 3 (three) times daily as needed for muscle spasms. 60 tablet 1  ? dapagliflozin propanediol (FARXIGA) 10 MG TABS tablet Take 1 tablet (10 mg total) by mouth daily before breakfast. 90 tablet 3  ? diclofenac Sodium (VOLTAREN) 1 % GEL Apply 2 g topically 4 (four) times daily as needed (pain).     ? diphenhydrAMINE (BENADRYL) 25 mg capsule Take 50 mg by mouth in the morning, at noon, and at bedtime.    ? docusate sodium (COLACE) 100 MG capsule Take 1 capsule (100 mg total) by mouth 2 (two) times daily. 20 capsule 0  ? doxepin (SINEQUAN) 25 MG  capsule Take 50 mg by mouth at bedtime.     ? Dulaglutide (TRULICITY) 3 NU/2.7OZ SOPN Inject 3 mg as directed once a week. 6 mL 3  ? DULoxetine (CYMBALTA) 30 MG capsule Take 30-60 mg by mouth daily.    ? DULoxetine (CYMBALTA) 60 MG capsule Take 1 capsule (60 mg total) by mouth at bedtime. 90 capsule 4  ? enoxaparin (LOVENOX) 40 MG/0.4ML injection Inject 0.4 mLs (40 mg total) into the  skin daily for 14 days. 5.6 mL 0  ? EPINEPHrine 0.3 mg/0.3 mL IJ SOAJ injection Inject 0.3 mg into the muscle as needed for anaphylaxis. 1 each 12  ? ezetimibe (ZETIA) 10 MG tablet Take 1 tablet by mouth once daily 30 tablet 3  ? famotidine (PEPCID) 20 MG tablet Take 1 tablet (20 mg total) by mouth 2 (two) times daily. 60 tablet 2  ? furosemide (LASIX) 20 MG tablet Take 1 tablet (20 mg total) by mouth 2 (two) times daily. 10 tablet 0  ? Ginger, Zingiber officinalis, (GINGER PO) Take 1 tablet by mouth daily.    ? Ginseng 100 MG CAPS Take 100 mg by mouth daily.     ? Insulin Glargine (BASAGLAR KWIKPEN) 100 UNIT/ML Inject 32 Units into the skin at bedtime.    ? insulin lispro (HUMALOG KWIKPEN) 200 UNIT/ML KwikPen Inject 16-22 Units into the skin See admin instructions. Injecting 22 units with Breakfast, 16 units with lunch and supper    ? isosorbide mononitrate (IMDUR) 60 MG 24 hr tablet Take 1 tablet (60 mg total) by mouth daily. 90 tablet 4  ? lidocaine (XYLOCAINE) 2 % solution Use as directed 15 mLs in the mouth or throat as needed for mouth pain. 100 mL 2  ? Multiple Vitamins-Minerals (CENTRUM SILVER 50+MEN) TABS Take 1 tablet by mouth daily.    ? mupirocin ointment (BACTROBAN) 2 % Apply 1 application topically daily as needed.    ? NARCAN 4 MG/0.1ML LIQD nasal spray kit Place 0.4 mg into the nose as needed (opioid overdose).     ? nitroGLYCERIN (NITROSTAT) 0.4 MG SL tablet Place 0.4 mg under the tongue every 5 (five) minutes as needed for chest pain.     ? Omega-3 1000 MG CAPS Take 1,000 mg by mouth daily.     ? oxyCODONE 10 MG TABS  Take 1-1.5 tablets (10-15 mg total) by mouth every 3 (three) hours as needed for severe pain (pain score 7-10). 40 tablet 0  ? pantoprazole (PROTONIX) 40 MG tablet Take 1 tablet by mouth twice daily

## 2021-12-31 ENCOUNTER — Encounter: Payer: Self-pay | Admitting: Nurse Practitioner

## 2021-12-31 ENCOUNTER — Ambulatory Visit (INDEPENDENT_AMBULATORY_CARE_PROVIDER_SITE_OTHER): Payer: Medicare Other | Admitting: Nurse Practitioner

## 2021-12-31 VITALS — BP 111/75 | HR 92 | Temp 98.0°F | Ht 68.0 in | Wt 244.0 lb

## 2021-12-31 DIAGNOSIS — R1084 Generalized abdominal pain: Secondary | ICD-10-CM

## 2021-12-31 DIAGNOSIS — K219 Gastro-esophageal reflux disease without esophagitis: Secondary | ICD-10-CM

## 2021-12-31 DIAGNOSIS — K8689 Other specified diseases of pancreas: Secondary | ICD-10-CM

## 2021-12-31 DIAGNOSIS — R1030 Lower abdominal pain, unspecified: Secondary | ICD-10-CM | POA: Diagnosis not present

## 2021-12-31 MED ORDER — FAMOTIDINE 20 MG PO TABS: 20.0000 mg | ORAL_TABLET | Freq: Two times a day (BID) | ORAL | 2 refills | Status: DC

## 2021-12-31 NOTE — Assessment & Plan Note (Signed)
Noted on past chronic disease list, check Amylase and Lipase today. ?

## 2021-12-31 NOTE — Progress Notes (Signed)
? ?BP 111/75   Pulse 92   Temp 98 ?F (36.7 ?C) (Oral)   Ht 5' 8"  (1.727 m)   Wt 244 lb (110.7 kg)   SpO2 97%   BMI 37.10 kg/m?   ? ?Subjective:  ? ? Patient ID: Edward Steckman., male    DOB: 04-09-1956, 65 y.o.   MRN: [id number] ? ?HPI: ?Edward Schneider. is a 66 y.o. male ? ?Chief Complaint  ?Patient presents with  ? Abdominal Pain  ?  Patient is here to follow up on Abdominal Pain. Patient states he is not doing any better. Patient states he was put on a diet when he went and seen the doctor yesterday. Patient is scheduled for ultrasounds. Patient states he was told that he could not proceed with surgery due to fat around the hernia.   ? ?ABDOMINAL PAIN  ?Follow-up for abdominal pain.  Seen on 12/25/21.  CT at time noted ongoing small fat containing umbilical hernia, but no other acute findings to determine underlying cause of his pain.  He felt he had another hernia.  Was referred to general surgery per his request, he saw them yesterday -- no surgery at this time.  They plan on repeating right upper quadrant ultrasound and if negative they will consider HIDA scan. ? ?Started on diet yesterday and is trying to walk, so he can get down to 200 lbs and have surgical repair of umbilical hernia.  Pain presents depending on what he eats -- today for breakfast had Nabs, then lunch had grapefruit, and for dinner having salad.  Has been on Trulicity for diabetes, has been on for some time. Continues on Carafate and Pepcid daily + Protonix for GERD, but still eats 10-12 TUMS a day. ? ?Last endoscopy & colonoscopy was 06/18/20  ?Duration:days ?Onset: sudden ?Severity: not hurting much today due to lighter meals ?Quality: knife-like and sharp yesterday ?Location:  diffuse to to left side and midline  ?Episode duration: all day -- 24 hours ?Radiation: no ?Frequency: constant ?Alleviating factors: as below ?Aggravating factors: ?Status: fluctuating ?Treatments attempted: Percocet ?Fever: no ?Nausea: no ?Vomiting:  no ?Weight loss: no ?Decreased appetite: yes  ?Diarrhea: no - reports regular bowel movements without straining ?Constipation: no ?Blood in stool: when wipes only at times ?Heartburn: no ?Jaundice: no ?Rash: no ?Dysuria/urinary frequency: no ?Hematuria: no ?History of sexually transmitted disease: no ?Recurrent NSAID use: no  ? ?Relevant past medical, surgical, family and social history reviewed and updated as indicated. Interim medical history since our last visit reviewed. ?Allergies and medications reviewed and updated. ? ?Review of Systems  ?Constitutional: Negative.   ?Respiratory:  Negative for apnea, cough, chest tightness, shortness of breath and wheezing.   ?Cardiovascular:  Negative for chest pain, palpitations and leg swelling.  ?Gastrointestinal:  Positive for abdominal pain. Negative for abdominal distention, constipation, diarrhea, nausea and vomiting.  ?Endocrine: Negative.   ?Neurological: Negative.   ?Psychiatric/Behavioral: Negative.    ? ?Per HPI unless specifically indicated above ? ?   ?Objective:  ?  ?BP 111/75   Pulse 92   Temp 98 ?F (36.7 ?C) (Oral)   Ht 5' 8"  (1.727 m)   Wt 244 lb (110.7 kg)   SpO2 97%   BMI 37.10 kg/m?   ?Wt Readings from Last 3 Encounters:  ?12/31/21 244 lb (110.7 kg)  ?12/30/21 248 lb 6.4 oz (112.7 kg)  ?12/25/21 241 lb 9.6 oz (109.6 kg)  ?  ?Physical Exam ?Vitals and nursing note reviewed.  ?Constitutional:   ?  General: He is awake. He is not in acute distress. ?   Appearance: He is well-developed and well-groomed. He is obese. He is not ill-appearing or toxic-appearing.  ?HENT:  ?   Head: Normocephalic and atraumatic.  ?   Right Ear: Hearing normal. No drainage.  ?   Left Ear: Hearing normal. No drainage.  ?Eyes:  ?   General: Lids are normal.     ?   Right eye: No discharge.     ?   Left eye: No discharge.  ?   Conjunctiva/sclera: Conjunctivae normal.  ?   Pupils: Pupils are equal, round, and reactive to light.  ?Neck:  ?   Thyroid: No thyromegaly.  ?    Vascular: No carotid bruit.  ?   Trachea: Trachea normal.  ?Cardiovascular:  ?   Rate and Rhythm: Normal rate and regular rhythm.  ?   Heart sounds: Normal heart sounds, S1 normal and S2 normal. No murmur heard. ?  No gallop.  ?Pulmonary:  ?   Effort: Pulmonary effort is normal. No accessory muscle usage or respiratory distress.  ?   Breath sounds: Normal breath sounds.  ?Abdominal:  ?   General: Bowel sounds are normal. There is no distension.  ?   Palpations: Abdomen is soft.  ?   Tenderness: There is no abdominal tenderness. There is no right CVA tenderness, left CVA tenderness, guarding or rebound. Negative signs include Murphy's sign.  ?   Hernia: A hernia is present. Hernia is present in the umbilical area. There is no hernia in the ventral area.  ?   Comments: No tenderness on exam today.  Abdomen very rotund.  ?Musculoskeletal:     ?   General: Normal range of motion.  ?   Cervical back: Normal range of motion and neck supple.  ?   Right lower leg: No edema.  ?   Left lower leg: No edema.  ?Skin: ?   General: Skin is warm and dry.  ?   Capillary Refill: Capillary refill takes less than 2 seconds.  ?   Findings: No rash.  ?   Comments: Scattered pale bruising to upper extremities and abrasions -- baseline.    ?Neurological:  ?   Mental Status: He is alert and oriented to person, place, and time.  ?   Motor: Motor function is intact.  ?   Coordination: Coordination is intact.  ?   Gait: Gait is intact.  ?   Deep Tendon Reflexes: Reflexes are normal and symmetric.  ?Psychiatric:     ?   Attention and Perception: Attention normal.     ?   Mood and Affect: Mood normal.     ?   Behavior: Behavior normal. Behavior is cooperative.     ?   Thought Content: Thought content normal.  ? ? ?Results for orders placed or performed in visit on 12/25/21  ?CBC with Differential/Platelet  ?Result Value Ref Range  ? WBC 14.6 (H) 3.4 - 10.8 x10E3/uL  ? RBC 4.83 4.14 - 5.80 x10E6/uL  ? Hemoglobin 12.7 (L) 13.0 - 17.7 g/dL  ?  Hematocrit 39.7 37.5 - 51.0 %  ? MCV 82 79 - 97 fL  ? MCH 26.3 (L) 26.6 - 33.0 pg  ? MCHC 32.0 31.5 - 35.7 g/dL  ? RDW 16.8 (H) 11.6 - 15.4 %  ? Platelets 258 150 - 450 x10E3/uL  ? Neutrophils 67 Not Estab. %  ? Lymphs 22 Not Estab. %  ? Monocytes  7 Not Estab. %  ? Eos 2 Not Estab. %  ? Basos 1 Not Estab. %  ? Neutrophils Absolute 9.8 (H) 1.4 - 7.0 x10E3/uL  ? Lymphocytes Absolute 3.2 (H) 0.7 - 3.1 x10E3/uL  ? Monocytes Absolute 1.1 (H) 0.1 - 0.9 x10E3/uL  ? EOS (ABSOLUTE) 0.3 0.0 - 0.4 x10E3/uL  ? Basophils Absolute 0.1 0.0 - 0.2 x10E3/uL  ? Immature Granulocytes 1 Not Estab. %  ? Immature Grans (Abs) 0.2 (H) 0.0 - 0.1 x10E3/uL  ?Comprehensive metabolic panel  ?Result Value Ref Range  ? Glucose 145 (H) 70 - 99 mg/dL  ? BUN 24 8 - 27 mg/dL  ? Creatinine, Ser 1.20 0.76 - 1.27 mg/dL  ? eGFR 68 >59 mL/min/1.73  ? BUN/Creatinine Ratio 20 10 - 24  ? Sodium 138 134 - 144 mmol/L  ? Potassium 4.7 3.5 - 5.2 mmol/L  ? Chloride 98 96 - 106 mmol/L  ? CO2 24 20 - 29 mmol/L  ? Calcium 10.2 8.6 - 10.2 mg/dL  ? Total Protein 7.2 6.0 - 8.5 g/dL  ? Albumin 4.5 3.8 - 4.8 g/dL  ? Globulin, Total 2.7 1.5 - 4.5 g/dL  ? Albumin/Globulin Ratio 1.7 1.2 - 2.2  ? Bilirubin Total 0.6 0.0 - 1.2 mg/dL  ? Alkaline Phosphatase 73 44 - 121 IU/L  ? AST 29 0 - 40 IU/L  ? ALT 38 0 - 44 IU/L  ?TSH  ?Result Value Ref Range  ? TSH 1.490 0.450 - 4.500 uIU/mL  ? ?   ?Assessment & Plan:  ? ?Problem List Items Addressed This Visit   ? ?  ? Digestive  ? GERD (gastroesophageal reflux disease)  ?  Poorly controlled, is taking multiple medications and continued to "eat TUMS like candy" (10-12 a day).  Will get back into GI.  He is on Trulicity, has been for long while, educated him on side effects of this and the importance of eating smaller/lighter meals with this medication. Labs today. ?  ?  ? Relevant Medications  ? famotidine (PEPCID) 20 MG tablet  ? Pancreatic insufficiency  ?  Noted on past chronic disease list, check Amylase and Lipase today. ?  ?  ?  ?  Other  ? Generalized abdominal pain - Primary  ?  Acute since yesterday, significantly tender on exam.  Obtain labs CBC, CMP, GGT, Lipase,  ?Amylase, ESR, CRP, ANA.  Continue collaboration with general surgery, who patient had reques

## 2021-12-31 NOTE — Assessment & Plan Note (Signed)
Acute since yesterday, significantly tender on exam.  Obtain labs CBC, CMP, GGT, Lipase,  ?Amylase, ESR, CRP, ANA.  Continue collaboration with general surgery, who patient had requested to see, appreciated their input.  Suspect this may be more poorly controlled GERD or related to possible gastroparesis with diabetes, as smaller meals have improved pain.  Provided education on diet and diabetes, reiterated information.  Referral to GI placed due to ongoing symptoms. ?

## 2021-12-31 NOTE — Assessment & Plan Note (Signed)
Poorly controlled, is taking multiple medications and continued to "eat TUMS like candy" (10-12 a day).  Will get back into GI.  He is on Trulicity, has been for long while, educated him on side effects of this and the importance of eating smaller/lighter meals with this medication. Labs today. ?

## 2021-12-31 NOTE — Patient Instructions (Signed)
Abdominal Pain, Adult Many things can cause belly (abdominal) pain. Most times, belly pain is not dangerous. Many cases of belly pain can be watched and treated at home. Sometimes, though, belly pain is serious. Yourdoctor will try to find the cause of your belly pain. Follow these instructions at home:  Medicines Take over-the-counter and prescription medicines only as told by your doctor. Do not take medicines that help you poop (laxatives) unless told by your doctor. General instructions Watch your belly pain for any changes. Drink enough fluid to keep your pee (urine) pale yellow. Keep all follow-up visits as told by your doctor. This is important. Contact a doctor if: Your belly pain changes or gets worse. You are not hungry, or you lose weight without trying. You are having trouble pooping (constipated) or have watery poop (diarrhea) for more than 2-3 days. You have pain when you pee or poop. Your belly pain wakes you up at night. Your pain gets worse with meals, after eating, or with certain foods. You are vomiting and cannot keep anything down. You have a fever. You have blood in your pee. Get help right away if: Your pain does not go away as soon as your doctor says it should. You cannot stop vomiting. Your pain is only in areas of your belly, such as the right side or the left lower part of the belly. You have bloody or black poop, or poop that looks like tar. You have very bad pain, cramping, or bloating in your belly. You have signs of not having enough fluid or water in your body (dehydration), such as: Dark pee, very little pee, or no pee. Cracked lips. Dry mouth. Sunken eyes. Sleepiness. Weakness. You have trouble breathing or chest pain. Summary Many cases of belly pain can be watched and treated at home. Watch your belly pain for any changes. Take over-the-counter and prescription medicines only as told by your doctor. Contact a doctor if your belly pain  changes or gets worse. Get help right away if you have very bad pain, cramping, or bloating in your belly. This information is not intended to replace advice given to you by your health care provider. Make sure you discuss any questions you have with your healthcare provider. Document Revised: 02/13/2019 Document Reviewed: 02/13/2019 Elsevier Patient Education  2022 Elsevier Inc.  

## 2022-01-01 ENCOUNTER — Telehealth: Payer: Self-pay

## 2022-01-01 LAB — COMPREHENSIVE METABOLIC PANEL
ALT: 39 IU/L (ref 0–44)
AST: 32 IU/L (ref 0–40)
Albumin/Globulin Ratio: 1.6 (ref 1.2–2.2)
Albumin: 4.6 g/dL (ref 3.8–4.8)
Alkaline Phosphatase: 77 IU/L (ref 44–121)
BUN/Creatinine Ratio: 15 (ref 10–24)
BUN: 18 mg/dL (ref 8–27)
Bilirubin Total: 0.6 mg/dL (ref 0.0–1.2)
CO2: 17 mmol/L — ABNORMAL LOW (ref 20–29)
Calcium: 10.2 mg/dL (ref 8.6–10.2)
Chloride: 97 mmol/L (ref 96–106)
Creatinine, Ser: 1.21 mg/dL (ref 0.76–1.27)
Globulin, Total: 2.8 g/dL (ref 1.5–4.5)
Glucose: 340 mg/dL — ABNORMAL HIGH (ref 70–99)
Potassium: 5.5 mmol/L — ABNORMAL HIGH (ref 3.5–5.2)
Sodium: 132 mmol/L — ABNORMAL LOW (ref 134–144)
Total Protein: 7.4 g/dL (ref 6.0–8.5)
eGFR: 67 mL/min/{1.73_m2} (ref >59)

## 2022-01-01 LAB — AMYLASE: Amylase: 50 U/L (ref 31–110)

## 2022-01-01 LAB — CBC WITH DIFFERENTIAL/PLATELET
Basophils Absolute: 0.1 10*3/uL (ref 0.0–0.2)
Basos: 1 %
EOS (ABSOLUTE): 0.1 10*3/uL (ref 0.0–0.4)
Eos: 1 %
Hematocrit: 39.9 % (ref 37.5–51.0)
Hemoglobin: 13 g/dL (ref 13.0–17.7)
Immature Grans (Abs): 0.1 10*3/uL (ref 0.0–0.1)
Immature Granulocytes: 1 %
Lymphocytes Absolute: 2 10*3/uL (ref 0.7–3.1)
Lymphs: 20 %
MCH: 26.8 pg (ref 26.6–33.0)
MCHC: 32.6 g/dL (ref 31.5–35.7)
MCV: 82 fL (ref 79–97)
Monocytes Absolute: 0.6 10*3/uL (ref 0.1–0.9)
Monocytes: 6 %
Neutrophils Absolute: 7.2 10*3/uL — ABNORMAL HIGH (ref 1.4–7.0)
Neutrophils: 71 %
Platelets: 197 10*3/uL (ref 150–450)
RBC: 4.85 x10E6/uL (ref 4.14–5.80)
RDW: 17 % — ABNORMAL HIGH (ref 11.6–15.4)
WBC: 10 10*3/uL (ref 3.4–10.8)

## 2022-01-01 LAB — GAMMA GT: GGT: 98 IU/L — ABNORMAL HIGH (ref 0–65)

## 2022-01-01 LAB — LIPASE: Lipase: 33 U/L (ref 13–78)

## 2022-01-01 LAB — SEDIMENTATION RATE: Sed Rate: 18 mm/hr (ref 0–30)

## 2022-01-01 LAB — C-REACTIVE PROTEIN: CRP: 3 mg/L (ref 0–10)

## 2022-01-01 LAB — ANA W/REFLEX IF POSITIVE: Anti Nuclear Antibody (ANA): NEGATIVE

## 2022-01-01 NOTE — Progress Notes (Signed)
Please let Edward Schneider know his labs have returned: ?- CBC remains stable with no infection showing. ?- Sugar was quite elevated at 340, continue to work on diet changes as this was pushing salt level down, it is mildly low on check.  Potassium is a little high, cut back on high potassium foods like bananas, dried fruit, mangoes, raisin bran. ?- GGT is a little elevated, very mild.  This looks at gall bladder often, we will recheck next visit. ?- Waiting on one more labs, but current inflammatory labs are all normal.  Any questions? ?Keep being stellar!!  Thank you for allowing me to participate in your care.  I appreciate you. ?Kindest regards, ?Tenley Winward ?

## 2022-01-01 NOTE — Telephone Encounter (Signed)
Scheduled for 02/16/2022 ?

## 2022-01-02 ENCOUNTER — Other Ambulatory Visit: Payer: Self-pay

## 2022-01-02 ENCOUNTER — Telehealth: Payer: Self-pay

## 2022-01-02 ENCOUNTER — Ambulatory Visit
Admission: EM | Admit: 2022-01-02 | Discharge: 2022-01-02 | Disposition: A | Discharge status: Home / Self Care | Payer: Medicare Other

## 2022-01-02 DIAGNOSIS — S61001A Unspecified open wound of right thumb without damage to nail, initial encounter: Secondary | ICD-10-CM

## 2022-01-02 NOTE — Progress Notes (Signed)
? ? ?Chronic Care Management ?Pharmacy Assistant  ? ?Name: Edward Schneider.  MRN: [id number] DOB: 13-Sep-1956 ? ?Edward Schneider. is an 66 y.o. year old male who presents for his follow-up CCM visit with the clinical pharmacist. ? ?Reason for Encounter: Disease State ?  ?Conditions to be addressed/monitored: ?DMII ? ? ?Recent office visits:  ?12/31/21 Marnee Guarneri T, NP-PCP (abdominal pain) Referral to GI placed due to ongoing symptoms. ? ?12/25/21 Marnee Guarneri T, NP-PCP (abdominal pain)  labs CBC, CMP, TSH.  STAT abdominal CT ordered to further assess ? ?10/17/21 Cannady, Henrine Screws T, NP-PCP (diabetes,copd,hypertension and hyperlipidemia,pain) CMP and CBC today.   ?Recent consult visits:  ?12/30/21 Ronny Bacon, MD-General Surgery (Abdominal pain) ordered a repeat right upper quadrant abdominal ultrasound, (US ABDOMEN LIMITED RUQ (LIVER/GB) ? ?12/19/21 Martyn Ehrich, NP-Pulmonary Disease (SOB) Started patient on lasix 45m BID x 5 days. Labs (BNP and BMET)CXR re: shortness of breath,CPAP titration study re: OSA  ? ? 12/12/21 JValerie Roys DO-Family Medicine (Blister) HSV(herpes simplex vrs) 1+2 ab-IgG. ?Medication changes: EPINEPHrine 0.3 mg/0.3 mL IJ SOAJ injection, famotidine (PEPCID) 20 MG tablet, predniSONE (DELTASONE) 10 MG tablet, lidocaine (XYLOCAINE) 2 % solution, valACYclovir (VALTREX) 1000 MG tablet ? ?11/21/21 WMartyn Ehrich NP-Pulmonary Disease (COPD) Resume Stiolto- take 2 puffs every morning ( 2 week sample given and RX sent to [facility]) Use albuterol inhaler every 6 hours for breakthrough shortness of breath/wheezing  ?Referral: ?Neurology re: numbness right arm ? ?10/02/21 End, CHarrell Gave MD-Cardiology (CAD) No orders or med changes ? ?09/04/21 Shamleffer, IMelanie Crazier MD-Endocrinology (Diabetes mellitus)  Increased Trulicity 3  mg weekly, Increase Farxiga 10 mg , 1 tablet with Breakfast  ? ?Hospital visits:  ?Medication Reconciliation was completed by comparing discharge summary,  patient?s EMR and Pharmacy list, and upon discussion with patient. ? ?Admitted to the hospital on 08/31/21 due to right knee pain. Discharge date was 08/31/21. [facility] ? ?Medications that remain the same after Hospital Discharge:??  ?-All other medications will remain the same.   ? ?Medications: ?Outpatient Encounter Medications as of 01/02/2022  ?Medication Sig  ? acetaminophen (TYLENOL) 650 MG CR tablet Take 1,300 mg by mouth every 8 (eight) hours.  ? albuterol (VENTOLIN HFA) 108 (90 Base) MCG/ACT inhaler Inhale 2 puffs into the lungs every 6 (six) hours as needed for wheezing or shortness of breath.  ? Alcohol Swabs (PHARMACIST CHOICE ALCOHOL) PADS SMARTSIG:1 Each Topical 4 Times Daily  ? amoxicillin-clavulanate (AUGMENTIN) 875-125 MG tablet Take 1 tablet by mouth 2 (two) times daily for 7 days.  ? Ascorbic Acid (VITAMIN C) 1000 MG tablet Take 1,000 mg by mouth daily.   ? aspirin EC 81 MG tablet Take 81 mg by mouth daily. Swallow whole.  ? atorvastatin (LIPITOR) 80 MG tablet TAKE 1 TABLET BY MOUTH AT BEDTIME  ? benazepril (LOTENSIN) 10 MG tablet Take 1 tablet by mouth once daily  ? calcium carbonate (OS-CAL) 600 MG TABS tablet Take 600 mg by mouth daily.   ? cephALEXin (KEFLEX) 500 MG capsule Take 500 mg by mouth 2 (two) times daily.  ? cetirizine (ZYRTEC) 10 MG tablet Take 1 tablet (10 mg total) by mouth daily.  ? Cholecalciferol (VITAMIN D3) 25 MCG (1000 UT) CHEW Chew 1,000 Units by mouth daily.   ? CINNAMON PO Take 1,000 mg by mouth 2 (two) times daily.   ? clobetasol cream (TEMOVATE) 04.09% Apply 1 application topically 2 (two) times daily.  ? COMFORT EZ PEN  NEEDLES 32G X 4 MM MISC   ? Continuous Blood Gluc Sensor (FREESTYLE LIBRE 2 SENSOR) MISC Use as instructed to check blood sugar daily  ? cyclobenzaprine (FLEXERIL) 10 MG tablet Take 1 tablet (10 mg total) by mouth 3 (three) times daily as needed for muscle spasms.  ? dapagliflozin propanediol (FARXIGA) 10 MG  TABS tablet Take 1 tablet (10 mg total) by mouth daily before breakfast.  ? diclofenac Sodium (VOLTAREN) 1 % GEL Apply 2 g topically 4 (four) times daily as needed (pain).   ? diphenhydrAMINE (BENADRYL) 25 mg capsule Take 50 mg by mouth in the morning, at noon, and at bedtime.  ? docusate sodium (COLACE) 100 MG capsule Take 1 capsule (100 mg total) by mouth 2 (two) times daily.  ? doxepin (SINEQUAN) 25 MG capsule Take 50 mg by mouth at bedtime.   ? doxepin (SINEQUAN) 50 MG capsule Take 50-100 mg by mouth at bedtime.  ? Dulaglutide (TRULICITY) 3 DD/2.2GU SOPN Inject 3 mg as directed once a week.  ? DULoxetine (CYMBALTA) 30 MG capsule Take 30-60 mg by mouth daily.  ? DULoxetine (CYMBALTA) 60 MG capsule Take 1 capsule (60 mg total) by mouth at bedtime.  ? enoxaparin (LOVENOX) 40 MG/0.4ML injection Inject 0.4 mLs (40 mg total) into the skin daily for 14 days.  ? EPINEPHrine 0.3 mg/0.3 mL IJ SOAJ injection Inject 0.3 mg into the muscle as needed for anaphylaxis.  ? ezetimibe (ZETIA) 10 MG tablet Take 1 tablet by mouth once daily  ? famotidine (PEPCID) 20 MG tablet Take 1 tablet (20 mg total) by mouth 2 (two) times daily.  ? furosemide (LASIX) 20 MG tablet Take 1 tablet (20 mg total) by mouth 2 (two) times daily.  ? Ginger, Zingiber officinalis, (GINGER PO) Take 1 tablet by mouth daily.  ? Ginseng 100 MG CAPS Take 100 mg by mouth daily.   ? Insulin Glargine (BASAGLAR KWIKPEN) 100 UNIT/ML Inject 32 Units into the skin at bedtime.  ? insulin lispro (HUMALOG KWIKPEN) 200 UNIT/ML KwikPen Inject 16-22 Units into the skin See admin instructions. Injecting 22 units with Breakfast, 16 units with lunch and supper  ? isosorbide mononitrate (IMDUR) 60 MG 24 hr tablet Take 1 tablet (60 mg total) by mouth daily.  ? lidocaine (XYLOCAINE) 2 % solution Use as directed 15 mLs in the mouth or throat as needed for mouth pain.  ? mometasone (ELOCON) 0.1 % ointment Apply topically 2 (two) times daily as needed.  ? Multiple Vitamins-Minerals  (CENTRUM SILVER 50+MEN) TABS Take 1 tablet by mouth daily.  ? mupirocin ointment (BACTROBAN) 2 % Apply 1 application topically daily as needed.  ? NARCAN 4 MG/0.1ML LIQD nasal spray kit Place 0.4 mg into the nose as needed (opioid overdose).   ? nitroGLYCERIN (NITROSTAT) 0.4 MG SL tablet Place 0.4 mg under the tongue every 5 (five) minutes as needed for chest pain.   ? Omega-3 1000 MG CAPS Take 1,000 mg by mouth daily.   ? oxyCODONE 10 MG TABS Take 1-1.5 tablets (10-15 mg total) by mouth every 3 (three) hours as needed for severe pain (pain score 7-10).  ? pantoprazole (PROTONIX) 40 MG tablet Take 1 tablet by mouth twice daily  ? predniSONE (DELTASONE) 10 MG tablet 6 tabs tomorrow and Sunday, 5 tabs the next 2 days, decrease by 1 every other day until gone  ? pregabalin (LYRICA) 50 MG capsule TAKE 1 CAPSULE BY MOUTH THREE TIMES DAILY  ? PRESCRIPTION MEDICATION CPAP  ? senna-docusate (SENOKOT-S) 8.6-50  MG tablet Take 1 tablet by mouth at bedtime as needed for mild constipation.  ? sucralfate (CARAFATE) 1 g tablet TAKE 1 TABLET BY MOUTH 4 TIMES DAILY WITH MEALS AND AT BEDTIME  ? Tiotropium Bromide-Olodaterol (STIOLTO RESPIMAT) 2.5-2.5 MCG/ACT AERS Inhale 2 puffs into the lungs daily as needed (shortness of breath).  ? Tiotropium Bromide-Olodaterol (STIOLTO RESPIMAT) 2.5-2.5 MCG/ACT AERS Inhale 2 puffs into the lungs daily.  ? traMADol (ULTRAM) 50 MG tablet Take 1 tablet (50 mg total) by mouth every 6 (six) hours as needed.  ? traZODone (DESYREL) 100 MG tablet TAKE 1 TABLET BY MOUTH AT BEDTIME AS NEEDED FOR SLEEP  ? vitamin A 10000 UNIT capsule Take 10,000 Units by mouth daily.  ? vitamin B-12 (CYANOCOBALAMIN) 1000 MCG tablet Take 1,000 mcg by mouth daily.  ? Vitamin E 400 units TABS Take 400 Units by mouth daily.   ? ?No facility-administered encounter medications on file as of 01/02/2022.  ? ?Recent Relevant Labs: ?Lab Results  ?Component Value Date/Time  ? HGBA1C 8.3 (H) 08/23/2021 04:03 AM  ? HGBA1C 7.6 (H)  05/05/2021 09:28 AM  ? HGBA1C 9.7 (H) 12/10/2020 10:14 AM  ? HGBA1C 8.9 (H) 09/02/2020 02:40 PM  ? MICROALBUR 30 (H) 03/05/2021 09:19 AM  ? MICROALBUR 80 (H) 05/23/2020 08:34 AM  ?  ?Kidney Function ?Lab Results

## 2022-01-02 NOTE — ED Triage Notes (Signed)
Pt was slicing cucumbers and sliced off the end of his thumb ?

## 2022-01-02 NOTE — Discharge Instructions (Addendum)
-  I was able to stop the bleeding today.  As we discussed, the silver nitrate should not be scraped off.  It will eventually come off.  Do not soak your thumb. ?- Keep it clean and dry. ?- When the numbness wears off from the lidocaine, it may hurt.  Take your home pain medicine as needed. ?- Return if any signs of infection which include increased swelling, redness, pustular drainage or worsening pain or fever. ?

## 2022-01-02 NOTE — ED Provider Notes (Signed)
?[place] ? ? ? ?CSN: [id number] ?Arrival date & time: 01/02/22  1707 ? ? ?  ? ?History   ?Chief Complaint ?Chief Complaint  ?Patient presents with  ? Finger Injury  ? ? ?HPI ?Edward Schneider. is a 66 y.o. male presenting for avulsion of right thumb.  Patient says he was slicing cucumbers while making a salad and cut his thumb.  He says that a chunk is gone.  He has active bleeding.  Unable to get the bleeding under control with gauze and rags at home.  Patient also reports a lot of pain.  Some numbness of the thumb as well.  Past medical history significant for CAD with multiple heart procedures, hypertension, hyperlipidemia, previous MI, previous liver cancer, previous seizures, stroke, type 2 diabetes. ? ?HPI ? ?Past Medical History:  ?Diagnosis Date  ? Allergy   ? Aortic atherosclerosis ([place])   ? Asthma   ? C. difficile diarrhea   ? Chronic pain   ? Collagenous colitis   ? Coronary artery disease   ? a.) PCI with 2.75 x 18 mm Resolute Onyx DES x 1 to prox/mid LAD on 09/05/2019  ? DDD (degenerative disc disease), cervical   ? DDD (degenerative disc disease), lumbar   ? GERD (gastroesophageal reflux disease)   ? Grade I diastolic dysfunction   ? Hepatic steatosis   ? Hyperlipidemia   ? Hypertension   ? Liver cancer ([place]) 03/2015  ? Migraines   ? Myocardial infarction [facility])    ? OSA on CPAP   ? Seizures ([place])   ? several as child when sick.  None since age 78  ? Stroke [facility])   ? 'mini-stroke" 30 yrs ago. no deficits.  ? T2DM (type 2 diabetes mellitus) ([place])   ? Wears dentures   ? full upper and lower  ? ? ?Patient Active Problem List  ? Diagnosis Date Noted  ? Allergic reaction to shellfish 12/12/2021  ? Atrial fibrillation with rapid ventricular response ([place])   ? CAD (coronary artery disease)   ? S/P TKR (total knee replacement) using cement, right 08/22/2021  ? Status post total hip replacement, right 06/03/2021  ? Senile purpura ([place]) 12/06/2020  ? Family history of malignant neoplasm of  gastrointestinal tract   ? Depression, major, single episode, moderate ([place]) 03/06/2020  ? Uncontrolled type 2 diabetes mellitus with hyperglycemia ([place]) 10/29/2019  ? Coronary artery disease of native artery of native heart with stable angina pectoris ([place]) 08/31/2019  ? Pain due to onychomycosis of toenails of both feet 08/10/2019  ? Morbid obesity ([place]) 05/17/2019  ? BPH (benign prostatic hyperplasia) 04/27/2018  ? Sleep apnea 02/08/2018  ? GERD (gastroesophageal reflux disease) 04/04/2017  ? Advanced care planning/counseling discussion 03/29/2017  ? DM type 2 with diabetic peripheral neuropathy ([place]) 09/30/2016  ? Insomnia 12/24/2015  ? Generalized abdominal pain   ? Hyperlipidemia associated with type 2 diabetes mellitus ([place]) 09/23/2015  ? Bilateral carotid artery stenosis 09/11/2015  ? Atherosclerosis of abdominal aorta ([place]) 08/26/2015  ? Migraine headache 08/21/2015  ? Emphysema lung ([place]) 04/25/2015  ? Hypertension associated with diabetes ([place]) 04/25/2015  ? DDD (degenerative disc disease), cervical 03/27/2015  ? DDD (degenerative disc disease), lumbar 03/27/2015  ? Cervical post-laminectomy syndrome 03/27/2015  ? Pancreatic insufficiency 01/28/2015  ? Chronic back pain 12/24/2014  ? ? ?Past Surgical History:  ?Procedure Laterality Date  ? APPENDECTOMY    ? BACK SURGERY    ? CARDIAC CATHETERIZATION    ? No  stent placed in his "30's"  ? CERVICAL FUSION    ? COLONOSCOPY WITH PROPOFOL N/A 03/06/2016  ? Procedure: COLONOSCOPY WITH PROPOFOL;  Surgeon: Lucilla Lame, MD;  Location: [facility] [facility];  Service: Endoscopy;  Laterality: N/A;  requests early  ? COLONOSCOPY WITH PROPOFOL N/A 06/18/2020  ? Procedure: COLONOSCOPY WITH PROPOFOL;  Surgeon: Lucilla Lame, MD;  Location: [facility] ENDOSCOPY;  Service: Endoscopy;  Laterality: N/A;  ? ESOPHAGOGASTRODUODENOSCOPY (EGD) WITH PROPOFOL N/A 09/20/2017  ? Procedure: ESOPHAGOGASTRODUODENOSCOPY (EGD) WITH PROPOFOL;  Surgeon: Lucilla Lame, MD;  Location: [place];  Service: Endoscopy;  Laterality: N/A;  Diabetic - oral meds  ? FINGER SURGERY Left   ? INTRAVASCULAR PRESSURE WIRE/FFR STUDY N/A 09/05/2019  ? Procedure: INTRAVASCULAR PRESSURE WIRE/FFR STUDY;  Surgeon: Nelva Bush, MD;  Location: [place] [facility];  Service: Cardiovascular;  Laterality: N/A;  ? KNEE SURGERY Right   ? LEFT HEART CATH AND CORONARY ANGIOGRAPHY Left 09/05/2019  ? Procedure: LEFT HEART CATH AND CORONARY ANGIOGRAPHY (2.75 x 18 mm Resolute Onyx DES x 1 to prox/mid LAD);  Surgeon: Nelva Bush, MD;  Location: [place] [facility];  Service: Cardiovascular;  Laterality: Left;  ? NECK SURGERY    ? spleen surgery    ? TOE SURGERY Right   ? TOTAL HIP ARTHROPLASTY Right 06/03/2021  ? Procedure: TOTAL HIP ARTHROPLASTY ANTERIOR APPROACH;  Surgeon: Hessie Knows, MD;  Location: [facility];  Service: Orthopedics;  Laterality: Right;  ? TOTAL KNEE ARTHROPLASTY Right 08/22/2021  ? Procedure: TOTAL KNEE ARTHROPLASTY;  Surgeon: Hessie Knows, MD;  Location: [facility] OR[facility];  Service: Orthopedics;  Laterality: Right;  ? ? ? ? ? ?Home Medications   ? ?Prior to Admission medications   ?Medication Sig Start Date End Date Taking? Authorizing Provider  ?acetaminophen (TYLENOL) 650 MG CR tablet Take 1,300 mg by mouth every 8 (eight) hours.   Yes [provider]  ?albuterol (VENTOLIN HFA) 108 (90 Base) MCG/ACT inhaler Inhale 2 puffs into the lungs every 6 (six) hours as needed for wheezing or shortness of breath. 12/24/20  Yes Sherrilyn Rist A, MD  ?Alcohol Swabs (PHARMACIST CHOICE ALCOHOL) PADS SMARTSIG:1 Each Topical 4 Times Daily 09/18/21  Yes [provider]  ?amoxicillin-clavulanate (AUGMENTIN) 875-125 MG tablet Take 1 tablet by mouth 2 (two) times daily for 7 days. 12/26/21 01/02/22 Yes [name], NP  ?Ascorbic Acid (VITAMIN C) 1000 MG tablet Take 1,000 mg by mouth daily.    Yes [provider]  ?aspirin EC 81 MG tablet Take 81 mg by mouth daily. Swallow whole.   Yes [provider]  ?atorvastatin (LIPITOR) 80 MG tablet TAKE 1 TABLET BY MOUTH AT BEDTIME 04/24/21  Yes End, Harrell Gave, MD  ?benazepril (LOTENSIN) 10 MG tablet Take 1 tablet by mouth once daily 10/02/21  Yes End, Harrell Gave, MD  ?calcium carbonate (OS-CAL) 600 MG TABS tablet Take 600 mg by mouth daily.    Yes [provider]  ?cephALEXin (KEFLEX) 500 MG capsule Take 500 mg by mouth 2 (two) times daily. 12/23/21  Yes [provider]  ?cetirizine (ZYRTEC) 10 MG tablet Take 1 tablet (10 mg total) by mouth daily. 12/12/21  Yes Johnson, Megan P, DO  ?Cholecalciferol (VITAMIN D3) 25 MCG (1000 UT) CHEW Chew 1,000 Units by mouth daily.    Yes [provider]  ?CINNAMON PO Take 1,000 mg by mouth 2 (two) times daily.    Yes [provider]  ?clobetasol cream (TEMOVATE) 7.10 % Apply 1 application topically 2 (two) times  daily. 07/17/20  Yes Kathrine Haddock, NP  ?COMFORT EZ PEN NEEDLES 32G X 4 MM MISC  09/18/21  Yes [provider]  ?Continuous Blood Gluc Sensor (FREESTYLE LIBRE 2 SENSOR) MISC Use as instructed to check blood sugar daily 01/16/21  Yes Shamleffer, Melanie Crazier, MD  ?cyclobenzaprine (FLEXERIL) 10 MG tablet Take 1 tablet (10 mg total) by mouth 3 (three) times daily as needed for muscle spasms. 01/07/18  Yes Lucilla Lame, MD  ?dapagliflozin propanediol (FARXIGA) 10 MG TABS tablet Take 1 tablet (10 mg total) by mouth daily before breakfast. 09/04/21  Yes Shamleffer, Melanie Crazier, MD  ?diclofenac Sodium (VOLTAREN) 1 % GEL Apply 2 g topically 4 (four) times daily as needed (pain).  07/10/20  Yes [provider]  ?diphenhydrAMINE (BENADRYL) 25 mg capsule Take 50 mg by mouth in the morning, at noon, and at bedtime.   Yes [provider]  ?docusate sodium (COLACE) 100 MG capsule Take 1 capsule (100 mg total) by mouth 2 (two) times daily. 06/04/21  Yes Duanne Guess, PA-C  ?doxepin (SINEQUAN) 25 MG capsule Take 50 mg by mouth at bedtime.  07/31/19  Yes [provider]  ?doxepin (SINEQUAN) 50 MG capsule Take 50-100 mg by mouth at bedtime. 12/23/21  Yes [provider]  ?Dulaglutide (TRULICITY) 3 KX/3.8HW SOPN Inject 3 mg as directed once a week. 09/04/21  Yes

## 2022-01-05 ENCOUNTER — Other Ambulatory Visit: Payer: Self-pay

## 2022-01-05 ENCOUNTER — Ambulatory Visit: Payer: Medicare Other | Admitting: Neurology

## 2022-01-05 ENCOUNTER — Encounter: Payer: Self-pay | Admitting: Neurology

## 2022-01-05 VITALS — BP 93/64 | HR 107 | Ht 68.0 in | Wt 251.0 lb

## 2022-01-05 DIAGNOSIS — R200 Anesthesia of skin: Secondary | ICD-10-CM

## 2022-01-05 DIAGNOSIS — R202 Paresthesia of skin: Secondary | ICD-10-CM
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
## 2022-01-05 NOTE — Patient Instructions (Addendum)
MRI brain without contrast ? ?MRA head and neck ? ?We will call you with the results and determine the next step. ?

## 2022-01-06 ENCOUNTER — Ambulatory Visit: Payer: Medicare Other

## 2022-01-06 ENCOUNTER — Telehealth: Payer: Self-pay

## 2022-01-06 DIAGNOSIS — E1142 Type 2 diabetes mellitus with diabetic polyneuropathy: Secondary | ICD-10-CM | POA: Diagnosis not present

## 2022-01-06 DIAGNOSIS — E1165 Type 2 diabetes mellitus with hyperglycemia: Secondary | ICD-10-CM | POA: Diagnosis not present

## 2022-01-06 NOTE — Telephone Encounter (Signed)
Patient will stop by tomorrow to fill out a new application for the [facility]. His application expired on 95/39/67.  ?Sample of [place] has been labeled and placed in fridge.  ?

## 2022-01-06 NOTE — Telephone Encounter (Signed)
[facility] application has been placed in folder up front  ?

## 2022-01-06 NOTE — Telephone Encounter (Signed)
LMTCB to let us know which medication he is needing. ?

## 2022-01-07 ENCOUNTER — Encounter: Payer: Self-pay | Admitting: Primary Care

## 2022-01-07 ENCOUNTER — Ambulatory Visit: Payer: Medicare Other | Admitting: Primary Care

## 2022-01-07 ENCOUNTER — Other Ambulatory Visit: Payer: Self-pay

## 2022-01-07 DIAGNOSIS — J432 Centrilobular emphysema: Secondary | ICD-10-CM | POA: Diagnosis not present

## 2022-01-07 DIAGNOSIS — G4733 Obstructive sleep apnea (adult) (pediatric): Secondary | ICD-10-CM

## 2022-01-07 DIAGNOSIS — R0600 Dyspnea, unspecified: Secondary | ICD-10-CM

## 2022-01-07 NOTE — Patient Instructions (Addendum)
Continue Stiolto two puffs in the morning  ?Cpap titration is scheduled for April 4th ?Continue to work on exercise and weight loss ?Call if shortness of breath worsens  ? ?Follow-up:  ?3 months with Dr. Ander Slade or Eustaquio Maize NP ?

## 2022-01-07 NOTE — Progress Notes (Signed)
? ?@Patient  ID: Edward Schneider., male    DOB: [birth year], 66 y.o.   MRN: [id number] ? ?Chief Complaint  ?Patient presents with  ? Follow-up  ? ? ?Referring provider: ?[name], NP ? ?HPI: ?66 year old male, former smoker. PMH significant for HTN, CAD, afib, GERD. Patient of Dr. Ander Slade.  ? ?Previous [facility] pulmonary encounter:  ?11/21/2021 ?Patient presents today for acute OV. He reports experiencing increased shortness of breath x 2 weeks. He has associated chest tightness and wheezing. He was using Anoro and Stiolto at different times. He most recently ran out Anoro around January 8th. He does not like powder inhalers. He thinks his shortness of breath is related to his CPAP use, he feels worse when using. He has been on CPAP for over a year. He reports having right arm and leg numbness/tingling. He has some weakness right lower extremity residual from an old back surgery. Denies f/c/s, changes in speech, confusion, headaches, nasal congestion, productive cough, chest discomfort.  ? ?12/19/2021 ?Patient presents today 1 month follow-up. Hx emphysema, sleep apnea. During last visit patient reports increased shortness of breath with associated chest tightness and wheezing since being of LABA/LAMA. Instructed him to resume daily use of his Stiolto Respimat inhaler. If symptoms persist would get chest imaging. He was referred to neurology d/t numbness in right arm/leg, he has an apt with Dr. Posey Pronto end of March.  ? ?He still having shortness of breath, feels it is related to his CPAP. He feels no improvement in his breathing since resuming Stiolto, however, he does report wheezing stopping since he restarted. He reports compliance with CPAP every night.  He has gained 7 lbs since last visit. He has been on diuretic in the past but not current.  ? ?01/07/2022 ?Patient presents today for 2 week follow-up. During last visit on 12/19/21 he had acute COPD symptoms. CXR without active cardiopulmonary disease. Wheezing  resolved on Stiolto. Reported weight gain and abd swelling, started patient on lasix 65m BID x 5 days. Labs were wnl. Dif tolerating CPAP, ordered titration study. Overall he is doing well today. He needs refill of inhalers. Breathing better. No shortness of breath. He has been walking a mile a day. He only experiencing dyspnea symptoms with long walks. He has an apt with GI in May. CT abdomen showed small fat containing umbilical hernia.  ? ?Allergies  ?Allergen Reactions  ? Bee Venom Anaphylaxis  ? Crestor [Rosuvastatin Calcium] Shortness Of Breath and Swelling  ? Fentanyl Itching and Hives  ?  blisters ?Patch  ? Gabapentin Diarrhea  ?  Severe diarrhea which caused incontinence, loss of appetite and weight loss.  ? Shellfish Allergy Anaphylaxis and Swelling  ?  Shrimp causes throat to swell and tingling in tongue.   ? Fire ADynegy  ? Furosemide Nausea And Vomiting  ? Buprenorphine Hcl Itching  ? Chlorhexidine Gluconate Itching and Rash  ? Simvastatin Diarrhea  ? ? ?Immunization History  ?Administered Date(s) Administered  ? Influenza,inj,Quad PF,6+ Mos 08/21/2015, 09/24/2016, 07/12/2017, 06/23/2018, 08/04/2018, 07/17/2020, 07/18/2021  ? Influenza-Unspecified 07/19/2014, 08/21/2015, 09/24/2016, 07/12/2017, 06/23/2018, 08/04/2018, 07/11/2019, 07/17/2020, 07/18/2021  ? Pneumococcal Polysaccharide-23 06/07/2012, 05/30/2020  ? Td 10/28/2007  ? Tdap 04/25/2018  ? Zoster Recombinat (Shingrix) 07/11/2019, 09/13/2019  ? ? ?Past Medical History:  ?Diagnosis Date  ? Allergy   ? Aortic atherosclerosis ([place]   ? Asthma   ? C. difficile diarrhea   ? Chronic pain   ? Collagenous colitis   ? Coronary artery  disease   ? a.) PCI with 2.75 x 18 mm Resolute Onyx DES x 1 to prox/mid LAD on 09/05/2019  ? DDD (degenerative disc disease), cervical   ? DDD (degenerative disc disease), lumbar   ? GERD (gastroesophageal reflux disease)   ? Grade I diastolic dysfunction   ? Hepatic steatosis   ? Hyperlipidemia   ? Hypertension   ? Liver cancer  ([place]) 03/2015  ? Migraines   ? Myocardial infarction [facility])    ? OSA on CPAP   ? Seizures ([place])   ? several as child when sick.  None since age 31  ? Stroke [facility])   ? 'mini-stroke" 30 yrs ago. no deficits.  ? T2DM (type 2 diabetes mellitus) ([place])   ? Wears dentures   ? full upper and lower  ? ? ?Tobacco History: ?Social History  ? ?Tobacco Use  ?Smoking Status Former  ? Packs/day: 2.00  ? Years: 50.00  ? Pack years: 100.00  ? Types: Cigarettes  ? Quit date: 2011  ? Years since quitting: 12.3  ?Smokeless Tobacco Never  ? ?Counseling given: Not Answered ? ? ?Outpatient Medications Prior to Visit  ?Medication Sig Dispense Refill  ? acetaminophen (TYLENOL) 650 MG CR tablet Take 1,300 mg by mouth every 8 (eight) hours.    ? albuterol (VENTOLIN HFA) 108 (90 Base) MCG/ACT inhaler Inhale 2 puffs into the lungs every 6 (six) hours as needed for wheezing or shortness of breath. 16 g 3  ? Alcohol Swabs (PHARMACIST CHOICE ALCOHOL) PADS SMARTSIG:1 Each Topical 4 Times Daily    ? Ascorbic Acid (VITAMIN C) 1000 MG tablet Take 1,000 mg by mouth daily.     ? aspirin EC 81 MG tablet Take 81 mg by mouth daily. Swallow whole.    ? atorvastatin (LIPITOR) 80 MG tablet TAKE 1 TABLET BY MOUTH AT BEDTIME 90 tablet 2  ? calcium carbonate (OS-CAL) 600 MG TABS tablet Take 600 mg by mouth daily.     ? cephALEXin (KEFLEX) 500 MG capsule Take 500 mg by mouth 2 (two) times daily.    ? cetirizine (ZYRTEC) 10 MG tablet Take 1 tablet (10 mg total) by mouth daily. (Patient not taking: Reported on 01/23/2022) 90 tablet 1  ? Cholecalciferol (VITAMIN D3) 25 MCG (1000 UT) CHEW Chew 1,000 Units by mouth daily.     ? CINNAMON PO Take 1,000 mg by mouth 2 (two) times daily.     ? clobetasol cream (TEMOVATE) 6.94 % Apply 1 application topically 2 (two) times daily. 30 g 0  ? COMFORT EZ PEN NEEDLES 32G X 4 MM MISC     ? Continuous Blood Gluc Sensor (FREESTYLE LIBRE 2 SENSOR) MISC Use as instructed to check blood sugar daily 2 each 2  ? cyclobenzaprine (FLEXERIL)  10 MG tablet Take 1 tablet (10 mg total) by mouth 3 (three) times daily as needed for muscle spasms. 60 tablet 1  ? dapagliflozin propanediol (FARXIGA) 10 MG TABS tablet Take 1 tablet (10 mg total) by mouth daily before breakfast. 90 tablet 3  ? diclofenac Sodium (VOLTAREN) 1 % GEL Apply 2 g topically 4 (four) times daily as needed (pain).     ? diphenhydrAMINE (BENADRYL) 25 mg capsule Take 50 mg by mouth in the morning, at noon, and at bedtime.    ? docusate sodium (COLACE) 100 MG capsule Take 1 capsule (100 mg total) by mouth 2 (two) times daily. 20 capsule 0  ? doxepin (SINEQUAN) 25 MG capsule Take 50 mg by  mouth at bedtime.     ? doxepin (SINEQUAN) 50 MG capsule Take 50-100 mg by mouth at bedtime.    ? Dulaglutide (TRULICITY) 3 OI/7.1IW SOPN Inject 3 mg as directed once a week. 6 mL 3  ? DULoxetine (CYMBALTA) 30 MG capsule Take 30-60 mg by mouth daily.    ? DULoxetine (CYMBALTA) 60 MG capsule Take 1 capsule (60 mg total) by mouth at bedtime. 90 capsule 4  ? EPINEPHrine 0.3 mg/0.3 mL IJ SOAJ injection Inject 0.3 mg into the muscle as needed for anaphylaxis. 1 each 12  ? ezetimibe (ZETIA) 10 MG tablet Take 1 tablet by mouth once daily 30 tablet 3  ? famotidine (PEPCID) 20 MG tablet Take 1 tablet (20 mg total) by mouth 2 (two) times daily. 60 tablet 2  ? Ferrous Gluconate-C-Folic Acid (IRON-C PO) Take 30 mg by mouth daily.    ? furosemide (LASIX) 20 MG tablet Take 1 tablet (20 mg total) by mouth 2 (two) times daily. 10 tablet 0  ? Ginger, Zingiber officinalis, (GINGER PO) Take 1 tablet by mouth daily.    ? Ginseng 100 MG CAPS Take 100 mg by mouth daily.     ? Insulin Glargine (BASAGLAR KWIKPEN) 100 UNIT/ML Inject 32 Units into the skin at bedtime.    ? lidocaine (XYLOCAINE) 2 % solution Use as directed 15 mLs in the mouth or throat as needed for mouth pain. 100 mL 2  ? mometasone (ELOCON) 0.1 % ointment Apply topically 2 (two) times daily as needed.    ? Multiple Vitamins-Minerals (CENTRUM SILVER 50+MEN) TABS Take  1 tablet by mouth daily.    ? mupirocin ointment (BACTROBAN) 2 % Apply 1 application topically daily as needed.    ? NARCAN 4 MG/0.1ML LIQD nasal spray kit Place 0.4 mg into the nose as needed (opioid overdo

## 2022-01-08 ENCOUNTER — Other Ambulatory Visit: Payer: Self-pay | Admitting: Nurse Practitioner

## 2022-01-08 ENCOUNTER — Telehealth: Payer: Self-pay | Admitting: Primary Care

## 2022-01-08 MED ORDER — STIOLTO RESPIMAT 2.5-2.5 MCG/ACT IN AERS: 2.0000 | INHALATION_SPRAY | Freq: Every day | RESPIRATORY_TRACT | 5 refills | Status: DC | PRN

## 2022-01-08 NOTE — Telephone Encounter (Signed)
Called and spoke with pt to confirm pharmacy that he was needing to have the [place] sent to and have sent that to the pharmacy for pt. ? ?Pt is requesting to have a refill of prednisone. Pt was prescribed a taper 12/12/21. Pt said that he does not have any complaints of wheezing but states that the prednisone does help him breathe better. ? ?Beth, please advise on this for pt. ?

## 2022-01-09 NOTE — Telephone Encounter (Signed)
If breathing is better on steroids, would recommend adding inhaled steroid and try trelegy 156mg one puff daily in place of stiolto. FU in 1 month if not already scheduled for a visit in the interim. Can you please send RX or give him a sample

## 2022-01-09 NOTE — Telephone Encounter (Signed)
Attempted to call pt but unable to reach. Left message for him to return call. °

## 2022-01-10 NOTE — Telephone Encounter (Signed)
Requested Prescriptions  ?Pending Prescriptions Disp Refills  ?? isosorbide mononitrate (IMDUR) 60 MG 24 hr tablet [Pharmacy Med Name: Isosorbide Mononitrate ER 60 MG Oral Tablet Extended Release 24 Hour] 90 tablet 2  ?  Sig: Take 1 tablet by mouth once daily  ?  ? Cardiovascular:  Nitrates Passed - 01/08/2022 11:23 AM  ?  ?  Passed - Last BP in normal range  ?  BP Readings from Last 1 Encounters:  ?01/07/22 94/62  ?   ?  ?  Passed - Last Heart Rate in normal range  ?  Pulse Readings from Last 1 Encounters:  ?01/07/22 (!) 103  ?   ?  ?  Passed - Valid encounter within last 12 months  ?  Recent Outpatient Visits   ?      ? 1 week ago Generalized abdominal pain  ? [place], [place] T, NP  ? 2 weeks ago Generalized abdominal pain  ? [place], McDonald T, NP  ? 4 weeks ago Allergic reaction to shellfish  ? [place], [place] P, DO  ? 2 months ago Uncontrolled type 2 diabetes mellitus with hyperglycemia ([place])  ? [place], [place] T, NP  ? 5 months ago Impetigo  ? [facility], Scheryl Darter, NP  ?  ?  ?Future Appointments   ?        ? In 1 week Furth, [employer], PA-C [place], [facility]  ? In 1 month Lucilla Lame, MD [facility] [place]  ? In 1 month Cannady, Barbaraann Faster, NP [employer], [facility]  ? In 2 months End, Harrell Gave, MD [facility], [facility]  ? In 3 months Cannady, Barbaraann Faster, NP [employer], [facility]  ?  ? ?  ?  ?  ?? traZODone (DESYREL) 100 MG tablet [Pharmacy Med Name: traZODone HCl 100 MG Oral Tablet] 90 tablet 0  ?  Sig: TAKE 1 TABLET BY MOUTH AT BEDTIME AS NEEDED FOR SLEEP  ?  ? Psychiatry: Antidepressants - Serotonin Modulator Passed - 01/08/2022 11:23 AM  ?  ?  Passed - Completed PHQ-2 or PHQ-9 in the last 360 days  ?  ?  Passed - Valid encounter within last 6 months  ?  Recent Outpatient Visits   ?      ? 1 week ago Generalized abdominal pain  ?  [place], [place] T, NP  ? 2 weeks ago Generalized abdominal pain  ? [place], [place] T, NP  ? 4 weeks ago Allergic reaction to shellfish  ? [place], [place] P, DO  ? 2 months ago Uncontrolled type 2 diabetes mellitus with hyperglycemia ([place])  ? [place], [place] T, NP  ? 5 months ago Impetigo  ? [facility], Scheryl Darter, NP  ?  ?  ?Future Appointments   ?        ? In 1 week Furth, [employer], PA-C [place], [facility]  ? In 1 month Lucilla Lame, MD [facility] [place]  ? In 1 month Cannady, Barbaraann Faster, NP [employer], [facility]  ? In 2 months End, Harrell Gave, MD [facility], [facility]  ? In 3 months Cannady, Barbaraann Faster, NP [employer], [facility]  ?  ? ?  ?  ?  ? ?

## 2022-01-12 MED ORDER — TRELEGY ELLIPTA 100-62.5-25 MCG/ACT IN AEPB: 1.0000 | INHALATION_SPRAY | Freq: Every day | RESPIRATORY_TRACT | 11 refills | Status: DC

## 2022-01-12 NOTE — Telephone Encounter (Signed)
Patient is returning phone call. Patient phone number is [phone]. ?

## 2022-01-12 NOTE — Telephone Encounter (Signed)
Spoke to patient and relayed below message/recommendations.  ?He would like to try trelegy. Rx sent to preferred pharmacy.  ?Appt scheduled 02/12/2022 at 11:00. ?Nothing further needed.  ?  ?

## 2022-01-20 ENCOUNTER — Ambulatory Visit (HOSPITAL_BASED_OUTPATIENT_CLINIC_OR_DEPARTMENT_OTHER): Payer: Medicare Other | Admitting: Internal Medicine

## 2022-01-20 DIAGNOSIS — G894 Chronic pain syndrome: Secondary | ICD-10-CM | POA: Diagnosis not present

## 2022-01-20 DIAGNOSIS — M5136 Other intervertebral disc degeneration, lumbar region: Secondary | ICD-10-CM | POA: Diagnosis not present

## 2022-01-20 DIAGNOSIS — M545 Low back pain, unspecified: Secondary | ICD-10-CM | POA: Diagnosis not present

## 2022-01-20 DIAGNOSIS — M792 Neuralgia and neuritis, unspecified: Secondary | ICD-10-CM | POA: Diagnosis not present

## 2022-01-20 DIAGNOSIS — G8929 Other chronic pain: Secondary | ICD-10-CM | POA: Diagnosis not present

## 2022-01-21 ENCOUNTER — Other Ambulatory Visit: Payer: Medicare Other

## 2022-01-21 ENCOUNTER — Inpatient Hospital Stay: Admission: RE | Admit: 2022-01-21 | Payer: Medicare Other | Source: Ambulatory Visit

## 2022-01-23 ENCOUNTER — Encounter: Payer: Self-pay | Admitting: Medical

## 2022-01-23 ENCOUNTER — Ambulatory Visit: Payer: Medicare Other | Admitting: Medical

## 2022-01-23 VITALS — BP 120/84 | HR 93 | Ht 68.0 in | Wt 252.0 lb

## 2022-01-23 DIAGNOSIS — Z6838 Body mass index (BMI) 38.0-38.9, adult: Secondary | ICD-10-CM

## 2022-01-23 DIAGNOSIS — I25118 Atherosclerotic heart disease of native coronary artery with other forms of angina pectoris: Secondary | ICD-10-CM | POA: Diagnosis not present

## 2022-01-23 DIAGNOSIS — I10 Essential (primary) hypertension: Secondary | ICD-10-CM | POA: Diagnosis not present

## 2022-01-23 DIAGNOSIS — G4733 Obstructive sleep apnea (adult) (pediatric): Secondary | ICD-10-CM

## 2022-01-23 DIAGNOSIS — E782 Mixed hyperlipidemia: Secondary | ICD-10-CM | POA: Diagnosis not present

## 2022-01-23 NOTE — Patient Instructions (Signed)
Medication Instructions:  ?No changes at this time.  ? ?*If you need a refill on your cardiac medications before your next appointment, please call your pharmacy* ? ? ?Lab Work: ?None ? ?If you have labs (blood work) drawn today and your tests are completely normal, you will receive your results only by: ?MyChart Message (if you have MyChart) OR ?A paper copy in the mail ?If you have any lab test that is abnormal or we need to change your treatment, we will call you to review the results. ? ? ?Testing/Procedures: ?None ? ? ?Follow-Up: ?At [facility], you and your health needs are our priority.  As part of our continuing mission to provide you with exceptional heart care, we have created designated Provider Care Teams.  These Care Teams include your primary Cardiologist (physician) and Advanced Practice Providers (APPs -  Physician Assistants and Nurse Practitioners) who all work together to provide you with the care you need, when you need it. ? ?We recommend signing up for the patient portal called "MyChart".  Sign up information is provided on this After Visit Summary.  MyChart is used to connect with patients for Virtual Visits (Telemedicine).  Patients are able to view lab/test results, encounter notes, upcoming appointments, etc.  Non-urgent messages can be sent to your provider as well.   ?To learn more about what you can do with MyChart, go to [email or web address].   ? ?Your next appointment:   ?1 year(s) ? ?The format for your next appointment:   ?In Person ? ?Provider:   ?You may see Nelva Bush, MD or one of the following Advanced Practice Providers on your designated Care Team:   ?Murray Hodgkins, NP ?Christell Faith, PA-C ?Cadence Kathlen Mody, PA-C{ ?

## 2022-01-23 NOTE — Progress Notes (Signed)
?Cardiology Office Note:   ? ?Date:  01/23/2022  ? ?ID:  Edward Schneider., DOB 23-May-1956, MRN [id number] ? ?PCP:  [name], NP  ?[facility] Cardiologist:  Nelva Bush, MD  ?[facility] Electrophysiologist:  None  ? ?Referring MD: [name], NP  ? ?Chief Complaint: chest tightness ? ?History of Present Illness:   ? ?Edward Schneider. is a 66 y.o. male with a hx of CAD s/p remote PTCA and more recent PCI to the mid LAD 08/2019, HTN, mini stroke, liver cancer, GERD, asthma who presents with chest pain. ? ?LHC in 08/2019 showed 2 V CAD with 60% pLAD stenosis and moderate disease in the rPL branches, normal LVSF, treated with PCI to proximal/mid LAD with DES. Carotid duplex February 2021 with bilateral 1 to 39% stenosis. Echo from 08/26/21 showed LVEF 60-65%, no WMA, mild LVH, indeterminate diastolic parameters, normal V function, mild calcification of the aortic valve.  ? ?Last seen 10/02/21 and was overall doing well from a cardiac standpoint. Lisinopril was decreased.  ? ?Today, the patient reports 1 episode of chest ain. It occurred In [place] helping his grandson move. IT was really hot. It was a stabbing pain with associated SOB. Pain lasted 3-4 hours. NTG helped ease the pain, but didn't completely take the pain away. Has not had any other episodes since then. He has been walking a mile day. He had his R hip and knee replaced in the last year. They will do the left side this coming year. He has dependent edema. He uses a CPAP at night.  ? ?Past Medical History:  ?Diagnosis Date  ? Allergy   ? Aortic atherosclerosis ([place])   ? Asthma   ? C. difficile diarrhea   ? Chronic pain   ? Collagenous colitis   ? Coronary artery disease   ? a.) PCI with 2.75 x 18 mm Resolute Onyx DES x 1 to prox/mid LAD on 09/05/2019  ? DDD (degenerative disc disease), cervical   ? DDD (degenerative disc disease), lumbar   ? GERD (gastroesophageal reflux disease)   ? Grade I diastolic dysfunction   ? Hepatic  steatosis   ? Hyperlipidemia   ? Hypertension   ? Liver cancer ([place]) 03/2015  ? Migraines   ? Myocardial infarction [facility])    ? OSA on CPAP   ? Seizures ([place])   ? several as child when sick.  None since age 37  ? Stroke [facility])   ? 'mini-stroke" 30 yrs ago. no deficits.  ? T2DM (type 2 diabetes mellitus) ([place])   ? Wears dentures   ? full upper and lower  ? ? ?Past Surgical History:  ?Procedure Laterality Date  ? APPENDECTOMY    ? BACK SURGERY    ? CARDIAC CATHETERIZATION    ? No stent placed in his "30's"  ? CERVICAL FUSION    ? COLONOSCOPY WITH PROPOFOL N/A 03/06/2016  ? Procedure: COLONOSCOPY WITH PROPOFOL;  Surgeon: Lucilla Lame, MD;  Location: [facility];  Service: Endoscopy;  Laterality: N/A;  requests early  ? COLONOSCOPY WITH PROPOFOL N/A 06/18/2020  ? Procedure: COLONOSCOPY WITH PROPOFOL;  Surgeon: Lucilla Lame, MD;  Location: [facility] ENDOSCOPY;  Service: Endoscopy;  Laterality: N/A;  ? ESOPHAGOGASTRODUODENOSCOPY (EGD) WITH PROPOFOL N/A 09/20/2017  ? Procedure: ESOPHAGOGASTRODUODENOSCOPY (EGD) WITH PROPOFOL;  Surgeon: Lucilla Lame, MD;  Location: [facility];  Service: Endoscopy;  Laterality: N/A;  Diabetic - oral meds  ? FINGER SURGERY Left   ? INTRAVASCULAR PRESSURE  WIRE/FFR STUDY N/A 09/05/2019  ? Procedure: INTRAVASCULAR PRESSURE WIRE/FFR STUDY;  Surgeon: Nelva Bush, MD;  Location: [facility] CV LAB;  Service: Cardiovascular;  Laterality: N/A;  ? KNEE SURGERY Right   ? LEFT HEART CATH AND CORONARY ANGIOGRAPHY Left 09/05/2019  ? Procedure: LEFT HEART CATH AND CORONARY ANGIOGRAPHY (2.75 x 18 mm Resolute Onyx DES x 1 to prox/mid LAD);  Surgeon: Nelva Bush, MD;  Location: [place] CV LAB;  Service: Cardiovascular;  Laterality: Left;  ? NECK SURGERY    ? spleen surgery    ? TOE SURGERY Right   ? TOTAL HIP ARTHROPLASTY Right 06/03/2021  ? Procedure: TOTAL HIP ARTHROPLASTY ANTERIOR APPROACH;  Surgeon: Hessie Knows, MD;  Location: [facility] ORS;  Service: Orthopedics;  Laterality: Right;   ? TOTAL KNEE ARTHROPLASTY Right 08/22/2021  ? Procedure: TOTAL KNEE ARTHROPLASTY;  Surgeon: Hessie Knows, MD;  Location: [facility] ORS;  Service: Orthopedics;  Laterality: Right;  ? ? ?Current Medications: ?Current Meds  ?Medication Sig  ? acetaminophen (TYLENOL) 650 MG CR tablet Take 1,300 mg by mouth every 8 (eight) hours.  ? albuterol (VENTOLIN HFA) 108 (90 Base) MCG/ACT inhaler Inhale 2 puffs into the lungs every 6 (six) hours as needed for wheezing or shortness of breath.  ? Alcohol Swabs (PHARMACIST CHOICE ALCOHOL) PADS SMARTSIG:1 Each Topical 4 Times Daily  ? Ascorbic Acid (VITAMIN C) 1000 MG tablet Take 1,000 mg by mouth daily.   ? aspirin EC 81 MG tablet Take 81 mg by mouth daily. Swallow whole.  ? atorvastatin (LIPITOR) 80 MG tablet TAKE 1 TABLET BY MOUTH AT BEDTIME  ? benazepril (LOTENSIN) 10 MG tablet Take 1 tablet by mouth once daily  ? calcium carbonate (OS-CAL) 600 MG TABS tablet Take 600 mg by mouth daily.   ? cephALEXin (KEFLEX) 500 MG capsule Take 500 mg by mouth 2 (two) times daily.  ? Cholecalciferol (VITAMIN D3) 25 MCG (1000 UT) CHEW Chew 1,000 Units by mouth daily.   ? CINNAMON PO Take 1,000 mg by mouth 2 (two) times daily.   ? clobetasol cream (TEMOVATE) 0.27 % Apply 1 application topically 2 (two) times daily.  ? COMFORT EZ PEN NEEDLES 32G X 4 MM MISC   ? Continuous Blood Gluc Sensor (FREESTYLE LIBRE 2 SENSOR) MISC Use as instructed to check blood sugar daily  ? cyclobenzaprine (FLEXERIL) 10 MG tablet Take 1 tablet (10 mg total) by mouth 3 (three) times daily as needed for muscle spasms.  ? dapagliflozin propanediol (FARXIGA) 10 MG TABS tablet Take 1 tablet (10 mg total) by mouth daily before breakfast.  ? diclofenac Sodium (VOLTAREN) 1 % GEL Apply 2 g topically 4 (four) times daily as needed (pain).   ? diphenhydrAMINE (BENADRYL) 25 mg capsule Take 50 mg by mouth in the morning, at noon, and at bedtime.  ? docusate sodium (COLACE) 100 MG capsule Take 1 capsule (100 mg total) by mouth 2 (two)  times daily.  ? doxepin (SINEQUAN) 25 MG capsule Take 50 mg by mouth at bedtime.   ? doxepin (SINEQUAN) 50 MG capsule Take 50-100 mg by mouth at bedtime.  ? Dulaglutide (TRULICITY) 3 XA/1.2IN SOPN Inject 3 mg as directed once a week.  ? DULoxetine (CYMBALTA) 30 MG capsule Take 30-60 mg by mouth daily.  ? DULoxetine (CYMBALTA) 60 MG capsule Take 1 capsule (60 mg total) by mouth at bedtime.  ? EPINEPHrine 0.3 mg/0.3 mL IJ SOAJ injection Inject 0.3 mg into the muscle as needed for anaphylaxis.  ? ezetimibe (ZETIA) 10 MG  tablet Take 1 tablet by mouth once daily  ? famotidine (PEPCID) 20 MG tablet Take 1 tablet (20 mg total) by mouth 2 (two) times daily.  ? Ferrous Gluconate-C-Folic Acid (IRON-C PO) Take 30 mg by mouth daily.  ? Fluticasone-Umeclidin-Vilant (TRELEGY ELLIPTA) 100-62.5-25 MCG/ACT AEPB Inhale 1 puff into the lungs daily.  ? furosemide (LASIX) 20 MG tablet Take 1 tablet (20 mg total) by mouth 2 (two) times daily.  ? Ginger, Zingiber officinalis, (GINGER PO) Take 1 tablet by mouth daily.  ? Ginseng 100 MG CAPS Take 100 mg by mouth daily.   ? Insulin Glargine (BASAGLAR KWIKPEN) 100 UNIT/ML Inject 32 Units into the skin at bedtime.  ? insulin lispro (HUMALOG KWIKPEN) 200 UNIT/ML KwikPen Inject 16-22 Units into the skin See admin instructions. Injecting 22 units with Breakfast, 16 units with lunch and supper  ? isosorbide mononitrate (IMDUR) 60 MG 24 hr tablet Take 1 tablet by mouth once daily  ? lidocaine (XYLOCAINE) 2 % solution Use as directed 15 mLs in the mouth or throat as needed for mouth pain.  ? mometasone (ELOCON) 0.1 % ointment Apply topically 2 (two) times daily as needed.  ? Multiple Vitamins-Minerals (CENTRUM SILVER 50+MEN) TABS Take 1 tablet by mouth daily.  ? mupirocin ointment (BACTROBAN) 2 % Apply 1 application topically daily as needed.  ? NARCAN 4 MG/0.1ML LIQD nasal spray kit Place 0.4 mg into the nose as needed (opioid overdose).   ? nitroGLYCERIN (NITROSTAT) 0.4 MG SL tablet Place 0.4 mg  under the tongue every 5 (five) minutes as needed for chest pain.   ? Omega-3 1000 MG CAPS Take 1,000 mg by mouth daily.   ? oxyCODONE 10 MG TABS Take 1-1.5 tablets (10-15 mg total) by mouth every 3 (three) hour

## 2022-01-26 ENCOUNTER — Other Ambulatory Visit: Payer: Self-pay | Admitting: Internal Medicine

## 2022-01-27 ENCOUNTER — Telehealth: Payer: Self-pay

## 2022-01-27 ENCOUNTER — Other Ambulatory Visit: Payer: Self-pay | Admitting: Internal Medicine

## 2022-01-27 MED ORDER — HUMALOG KWIKPEN 200 UNIT/ML ~~LOC~~ SOPN: PEN_INJECTOR | SUBCUTANEOUS | 3 refills | Status: DC

## 2022-01-27 MED ORDER — LYUMJEV KWIKPEN 100 UNIT/ML ~~LOC~~ SOPN
PEN_INJECTOR | SUBCUTANEOUS | 0 refills | Status: DC
Start: 2022-01-27 — End: 2022-12-18

## 2022-01-27 NOTE — Telephone Encounter (Signed)
Patient advise that he can stop by and pick up a sample of the Lyumjey. Patient is aware that the patient assistance application be faxed and awaiting approval. Patient is aware that he may have to get the Humalog from the pharmacy as we don't always have samples.  ?

## 2022-01-27 NOTE — Telephone Encounter (Signed)
Patient picked up Lyumjev - documented log and chart ?

## 2022-01-28 ENCOUNTER — Ambulatory Visit
Admission: RE | Admit: 2022-01-28 | Discharge: 2022-01-28 | Disposition: A | Discharge status: Home / Self Care | Payer: Medicare Other | Source: Ambulatory Visit | Attending: Neurology | Admitting: Neurology

## 2022-01-28 DIAGNOSIS — R202 Paresthesia of skin: Secondary | ICD-10-CM

## 2022-01-28 DIAGNOSIS — R200 Anesthesia of skin: Secondary | ICD-10-CM

## 2022-01-28 DIAGNOSIS — G459 Transient cerebral ischemic attack, unspecified: Secondary | ICD-10-CM | POA: Diagnosis not present

## 2022-02-02 ENCOUNTER — Other Ambulatory Visit: Payer: Self-pay

## 2022-02-02 DIAGNOSIS — R202 Paresthesia of skin: Secondary | ICD-10-CM

## 2022-02-02 DIAGNOSIS — R200 Anesthesia of skin: Secondary | ICD-10-CM

## 2022-02-02 NOTE — Progress Notes (Signed)
Orders placed.

## 2022-02-03 DIAGNOSIS — L281 Prurigo nodularis: Secondary | ICD-10-CM | POA: Diagnosis not present

## 2022-02-06 ENCOUNTER — Ambulatory Visit (HOSPITAL_BASED_OUTPATIENT_CLINIC_OR_DEPARTMENT_OTHER): Payer: Medicare Other | Admitting: Internal Medicine

## 2022-02-09 DIAGNOSIS — E1142 Type 2 diabetes mellitus with diabetic polyneuropathy: Secondary | ICD-10-CM | POA: Diagnosis not present

## 2022-02-09 DIAGNOSIS — E1165 Type 2 diabetes mellitus with hyperglycemia: Secondary | ICD-10-CM | POA: Diagnosis not present

## 2022-02-10 ENCOUNTER — Ambulatory Visit (HOSPITAL_BASED_OUTPATIENT_CLINIC_OR_DEPARTMENT_OTHER): Payer: Medicare Other | Attending: Primary Care | Admitting: Internal Medicine

## 2022-02-10 VITALS — Ht 68.0 in | Wt 240.0 lb

## 2022-02-10 DIAGNOSIS — G4733 Obstructive sleep apnea (adult) (pediatric): Secondary | ICD-10-CM | POA: Diagnosis not present

## 2022-02-10 DIAGNOSIS — Z79899 Other long term (current) drug therapy: Secondary | ICD-10-CM | POA: Insufficient documentation

## 2022-02-10 NOTE — Assessment & Plan Note (Addendum)
-   Patient has reported dyspnea associated with weight gain and leg swell. SOB improved with short course of lasix '20mg'$  BID. Labs wnl. Continue to work on exercise and weight loss ?

## 2022-02-10 NOTE — Assessment & Plan Note (Signed)
-   Difficulty tolerating CPAP, ordered for titration study ?

## 2022-02-10 NOTE — Assessment & Plan Note (Signed)
-   Stable. No acute resp symptoms. Continue Stiolto respimat ?

## 2022-02-11 ENCOUNTER — Ambulatory Visit
Admission: RE | Admit: 2022-02-11 | Discharge: 2022-02-11 | Disposition: A | Discharge status: Home / Self Care | Payer: Medicare Other | Source: Ambulatory Visit | Attending: Neurology | Admitting: Neurology

## 2022-02-11 DIAGNOSIS — M4802 Spinal stenosis, cervical region: Secondary | ICD-10-CM | POA: Diagnosis not present

## 2022-02-11 DIAGNOSIS — R200 Anesthesia of skin: Secondary | ICD-10-CM | POA: Diagnosis not present

## 2022-02-11 DIAGNOSIS — R202 Paresthesia of skin: Secondary | ICD-10-CM | POA: Diagnosis not present

## 2022-02-12 ENCOUNTER — Encounter: Payer: Medicare Other | Admitting: Primary Care

## 2022-02-12 ENCOUNTER — Telehealth: Payer: Self-pay | Admitting: Primary Care

## 2022-02-12 ENCOUNTER — Encounter: Payer: Self-pay | Admitting: Primary Care

## 2022-02-12 NOTE — Telephone Encounter (Signed)
Rescheduling today's visit as CPAP titration study was done on Tuesday 12/13/21 and results are still pending. Recommend doing virtual visit in 1 week to review ?

## 2022-02-12 NOTE — Progress Notes (Signed)
 This encounter was created in error - please disregard.

## 2022-02-13 DIAGNOSIS — M25361 Other instability, right knee: Secondary | ICD-10-CM | POA: Diagnosis not present

## 2022-02-13 DIAGNOSIS — Z96651 Presence of right artificial knee joint: Secondary | ICD-10-CM | POA: Diagnosis not present

## 2022-02-13 DIAGNOSIS — M1711 Unilateral primary osteoarthritis, right knee: Secondary | ICD-10-CM | POA: Diagnosis not present

## 2022-02-15 DIAGNOSIS — G4733 Obstructive sleep apnea (adult) (pediatric): Secondary | ICD-10-CM | POA: Diagnosis not present

## 2022-02-15 NOTE — Procedures (Signed)
? ? ?  Patient Name: Edward Schneider, Edward Schneider ?Study Date: 02/10/2022 ?Gender: Male ?D.O.B: 1956-04-03 ?Age (years): 13 ?Referring Provider: Ames Dura NP ?Height (inches): 68 ?Interpreting Physician: Jetty Duhamel MD, ABSM ?Weight (lbs): 240 ?RPSGT: Shelah Lewandowsky ?BMI: 36 ?MRN: [id number] ?Neck Size: 18.25 ? ?CLINICAL INFORMATION ?The patient is referred for a BiPAP titration to treat sleep apnea. ? ?Date of NPSG, Split Night or HST: 03/16/18  AHI  5/ hr, desaturation to 86% body weight 228 lbs ? ?SLEEP STUDY TECHNIQUE ?As per the AASM Manual for the Scoring of Sleep and Associated Events v2.3 (April 2016) with a hypopnea requiring 4% desaturations. ? ?The channels recorded and monitored were frontal, central and occipital EEG, electrooculogram (EOG), submentalis EMG (chin), nasal and oral airflow, thoracic and abdominal wall motion, anterior tibialis EMG, snore microphone, electrocardiogram, and pulse oximetry. Bilevel positive airway pressure (BPAP) was initiated at the beginning of the study and titrated to treat sleep-disordered breathing. ? ?MEDICATIONS ?Medications self-administered by patient taken the night of the study : Trazodone ? ?RESPIRATORY PARAMETERS ?Optimal IPAP Pressure (cm): 22 AHI at Optimal Pressure (/hr) 0 ?Optimal EPAP Pressure (cm): 18  ? ?Overall Minimal O2 (%): 88.0 Minimal O2 at Optimal Pressure (%): 93.0 ?SLEEP ARCHITECTURE ?Start Time: 10:09:00 PM Stop Time: 5:03:40 AM Total Time (min): 414.7 Total Sleep Time (min): 284 ?Sleep Latency (min): 28.5 Sleep Efficiency (%): 68.5% REM Latency (min): 157.0 WASO (min): 102.2 ?Stage N1 (%): 14.4% Stage N2 (%): 73.2% Stage N3 (%): 0.0% Stage R (%): 12.3 ?Supine (%): 25.00 Arousal Index (/hr): 5.7  ? ?CARDIAC DATA ?The 2 lead EKG demonstrated sinus rhythm. The mean heart rate was 80.3 beats per minute. Other EKG findings include: PVCs. ? ?LEG MOVEMENT DATA ?The total Periodic Limb Movements of Sleep (PLMS) were 0. The PLMS index was 0.0. A PLMS index of  <15 is considered normal in adults. ? ?IMPRESSIONS ?- CPAP did not provide adequate control at tolerated pressure and was converted to BILEVEL (BIPAP) titration. ?- An optimal PAP pressure was selected for this patient ( 22 / 18 cm of water) ?- Central sleep apnea was not noted during this titration (CAI = 0.4/h). ?- Mild oxygen desaturations were observed during this titration (min O2 = 88.0%). Minimum O2 saturation on BIPAP 22/18 was 93%. ?- The patient snored with soft snoring volume. ?- 2-lead EKG demonstrated: PVCs ?- Clinically significant periodic limb movements were not noted during this study. Arousals associated with PLMs were rare. ? ?DIAGNOSIS ?- Obstructive Sleep Apnea (G47.33) ? ?RECOMMENDATIONS ?- Trial of BiPAP therapy on 22/18 cm H2O or autoBIPAP 5-22/ 5-22 with a Small size Resmed Nasal AirFit P30I mask and heated humidification. Chin Strap. ?- Be careful with alcohol, sedatives and other CNS depressants that may worsen sleep apnea and disrupt normal sleep architecture. ?- Sleep hygiene should be reviewed to assess factors that may improve sleep quality. ?- Weight management and regular exercise should be initiated or continued. ? ?[Electronically signed] 02/15/2022 12:54 PM ? ?Jetty Duhamel MD, ABSM ?Diplomate, Biomedical engineer of Sleep Medicine ?NPI: [id number] ? ?  ? ? ? ? ? ? ? ? ? ? ? ? ? ? ? ? ? ? ? ? ? ?Brylen Wagar ?Diplomate, Biomedical engineer of Sleep Medicine ? ?ELECTRONICALLY SIGNED ON:  02/15/2022, 12:48 PM ?[facility] ?PH: [phone]   FX: [phone] ?ACCREDITED BY THE AMERICAN AC[employer] OF SLEEP [employer]ICINE ?

## 2022-02-16 ENCOUNTER — Telehealth: Payer: Self-pay

## 2022-02-16 ENCOUNTER — Encounter: Payer: Self-pay | Admitting: Gastroenterology

## 2022-02-16 ENCOUNTER — Telehealth: Payer: Self-pay | Admitting: Neurology

## 2022-02-16 ENCOUNTER — Ambulatory Visit (INDEPENDENT_AMBULATORY_CARE_PROVIDER_SITE_OTHER): Payer: Medicare Other | Admitting: Gastroenterology

## 2022-02-16 VITALS — BP 134/81 | HR 108 | Temp 97.9°F | Ht 68.0 in | Wt 250.0 lb

## 2022-02-16 DIAGNOSIS — R1032 Left lower quadrant pain: Secondary | ICD-10-CM

## 2022-02-16 MED ORDER — TRULANCE 3 MG PO TABS: 1.0000 | ORAL_TABLET | Freq: Every day | ORAL | 0 refills | Status: DC

## 2022-02-16 MED ORDER — BASAGLAR KWIKPEN 100 UNIT/ML ~~LOC~~ SOPN: 32.0000 [IU] | PEN_INJECTOR | Freq: Every day | SUBCUTANEOUS | 0 refills | Status: DC

## 2022-02-16 NOTE — Telephone Encounter (Signed)
Pt called in and left a message with the access nurse on Friday afternoon. He was returning a call about results. ?

## 2022-02-16 NOTE — Telephone Encounter (Signed)
Called Pt. Left message to call back for the second time. ?

## 2022-02-16 NOTE — Telephone Encounter (Signed)
Patient is aware that he needs to contact Edward Schneider to see what the status of his application is. Patient will pick up a sample of the Sharpes ?

## 2022-02-16 NOTE — Patient Instructions (Signed)
Trulance samples given ?

## 2022-02-16 NOTE — Telephone Encounter (Signed)
Results per Dr. Delice Lesch: ? ?Pls let patient know Dr. Posey Pronto is out. The MRI cervical spine showed arthritis changes and mild spinal canal stenosis. If he is still having a lot of the symptoms, would do physical therapy. Otherwise reassess with Dr. Posey Pronto in 4 months, thanks  ?

## 2022-02-16 NOTE — Telephone Encounter (Signed)
Returned call with message left to call back. ?

## 2022-02-16 NOTE — Telephone Encounter (Signed)
Pt. Called back and reported hands,face and legs  feels like pens and needles in them are getting more frequent. Does not feel like therapy will do any good, due to he has had it twice before.  ?

## 2022-02-16 NOTE — Progress Notes (Signed)
Primary Care Physician: Marjie Skiff, NP  Primary Gastroenterologist:  Dr. Midge Minium  Chief Complaint  Patient presents with   New Patient (Initial Visit)   Abdominal Pain    HPI: Edward Schneider. is a 66 y.o. male here Abdominal pain for many years.  The patient reports that has worse pain now.  He can pinpoint it to a softball-sized area in his left side of his abdomen.  The patient had CT scans were done in 2014, 2015, twice in 2016, 2017, 2019, 2022 and 2023.  All have been negative for any acute issues or cause for his recurrent symptoms.  In addition the patient has had ultrasounds and KUBs of his abdomen. He reports that he has not had any unexplained weight loss and reports that he is actually gaining weight.  He has a history of back pain shoulder pain and constipation that he states he has to strain to have a bowel movement  Past Medical History:  Diagnosis Date   Allergy    Aortic atherosclerosis (HCC)    Asthma    C. difficile diarrhea    Chronic pain    Collagenous colitis    Coronary artery disease    a.) PCI with 2.75 x 18 mm Resolute Onyx DES x 1 to prox/mid LAD on 09/05/2019   DDD (degenerative disc disease), cervical    DDD (degenerative disc disease), lumbar    GERD (gastroesophageal reflux disease)    Grade I diastolic dysfunction    Hepatic steatosis    Hyperlipidemia    Hypertension    Liver cancer (HCC) 03/2015   Migraines    Myocardial infarction (HCC)     OSA on CPAP    Seizures (HCC)    several as child when sick.  None since age 58   Stroke [facility])    'mini-stroke" 30 yrs ago. no deficits.   T2DM (type 2 diabetes mellitus) (HCC)    Wears dentures    full upper and lower    Current Outpatient Medications  Medication Sig Dispense Refill   acetaminophen (TYLENOL) 650 MG CR tablet Take 1,300 mg by mouth every 8 (eight) hours.     albuterol (VENTOLIN HFA) 108 (90 Base) MCG/ACT inhaler Inhale 2 puffs into the lungs every 6 (six) hours as  needed for wheezing or shortness of breath. 16 g 3   Alcohol Swabs (PHARMACIST CHOICE ALCOHOL) PADS SMARTSIG:1 Each Topical 4 Times Daily     Ascorbic Acid (VITAMIN C) 1000 MG tablet Take 1,000 mg by mouth daily.      aspirin EC 81 MG tablet Take 81 mg by mouth daily. Swallow whole.     atorvastatin (LIPITOR) 80 MG tablet TAKE 1 TABLET BY MOUTH AT BEDTIME 90 tablet 2   benazepril (LOTENSIN) 10 MG tablet Take 1 tablet by mouth once daily 90 tablet 2   calcium carbonate (OS-CAL) 600 MG TABS tablet Take 600 mg by mouth daily.      cephALEXin (KEFLEX) 500 MG capsule Take 500 mg by mouth 2 (two) times daily.     cetirizine (ZYRTEC) 10 MG tablet Take 1 tablet (10 mg total) by mouth daily. 90 tablet 1   Cholecalciferol (VITAMIN D3) 25 MCG (1000 UT) CHEW Chew 1,000 Units by mouth daily.      CINNAMON PO Take 1,000 mg by mouth 2 (two) times daily.      clobetasol cream (TEMOVATE) 0.05 % Apply 1 application topically 2 (two) times daily. 30 g 0  COMFORT EZ PEN NEEDLES 32G X 4 MM MISC      Continuous Blood Gluc Sensor (FREESTYLE LIBRE 2 SENSOR) MISC Use as instructed to check blood sugar daily 2 each 2   cyclobenzaprine (FLEXERIL) 10 MG tablet Take 1 tablet (10 mg total) by mouth 3 (three) times daily as needed for muscle spasms. 60 tablet 1   dapagliflozin propanediol (FARXIGA) 10 MG TABS tablet Take 1 tablet (10 mg total) by mouth daily before breakfast. 90 tablet 3   diclofenac Sodium (VOLTAREN) 1 % GEL Apply 2 g topically 4 (four) times daily as needed (pain).      diphenhydrAMINE (BENADRYL) 25 mg capsule Take 50 mg by mouth in the morning, at noon, and at bedtime.     docusate sodium (COLACE) 100 MG capsule Take 1 capsule (100 mg total) by mouth 2 (two) times daily. 20 capsule 0   doxepin (SINEQUAN) 25 MG capsule Take 50 mg by mouth at bedtime.      doxepin (SINEQUAN) 50 MG capsule Take 50-100 mg by mouth at bedtime.     Dulaglutide (TRULICITY) 3 MG/0.5ML SOPN Inject 3 mg as directed once a week. 6  mL 3   DULoxetine (CYMBALTA) 30 MG capsule Take 30-60 mg by mouth daily.     DULoxetine (CYMBALTA) 60 MG capsule Take 1 capsule (60 mg total) by mouth at bedtime. 90 capsule 4   EPINEPHrine 0.3 mg/0.3 mL IJ SOAJ injection Inject 0.3 mg into the muscle as needed for anaphylaxis. 1 each 12   ezetimibe (ZETIA) 10 MG tablet Take 1 tablet by mouth once daily 30 tablet 3   famotidine (PEPCID) 20 MG tablet Take 1 tablet (20 mg total) by mouth 2 (two) times daily. 60 tablet 2   Ferrous Gluconate-C-Folic Acid (IRON-C PO) Take 30 mg by mouth daily.     Fluticasone-Umeclidin-Vilant (TRELEGY ELLIPTA) 100-62.5-25 MCG/ACT AEPB Inhale 1 puff into the lungs daily. 60 each 11   furosemide (LASIX) 20 MG tablet Take 1 tablet (20 mg total) by mouth 2 (two) times daily. 10 tablet 0   Ginger, Zingiber officinalis, (GINGER PO) Take 1 tablet by mouth daily.     Ginseng 100 MG CAPS Take 100 mg by mouth daily.      Insulin Glargine (BASAGLAR KWIKPEN) 100 UNIT/ML Inject 32 Units into the skin at bedtime.     Insulin Glargine (BASAGLAR KWIKPEN) 100 UNIT/ML Inject 32 Units into the skin daily. 3 mL 0   insulin lispro (HUMALOG KWIKPEN) 200 UNIT/ML KwikPen Max daily 60 units 45 mL 3   Insulin Lispro-aabc (LYUMJEV KWIKPEN) 100 UNIT/ML KwikPen 22 units with Breakfast and continue 16 units with Lunch and 16 units with supper 3 mL 0   isosorbide mononitrate (IMDUR) 60 MG 24 hr tablet Take 1 tablet by mouth once daily 90 tablet 2   lidocaine (XYLOCAINE) 2 % solution Use as directed 15 mLs in the mouth or throat as needed for mouth pain. 100 mL 2   mometasone (ELOCON) 0.1 % ointment Apply topically 2 (two) times daily as needed.     Multiple Vitamins-Minerals (CENTRUM SILVER 50+MEN) TABS Take 1 tablet by mouth daily.     mupirocin ointment (BACTROBAN) 2 % Apply 1 application topically daily as needed.     NARCAN 4 MG/0.1ML LIQD nasal spray kit Place 0.4 mg into the nose as needed (opioid overdose).      nitroGLYCERIN (NITROSTAT)  0.4 MG SL tablet Place 0.4 mg under the tongue every 5 (five) minutes as needed  for chest pain.      Omega-3 1000 MG CAPS Take 1,000 mg by mouth daily.      oxyCODONE 10 MG TABS Take 1-1.5 tablets (10-15 mg total) by mouth every 3 (three) hours as needed for severe pain (pain score 7-10). 40 tablet 0   pantoprazole (PROTONIX) 40 MG tablet Take 1 tablet by mouth twice daily 180 tablet 2   predniSONE (DELTASONE) 10 MG tablet 6 tabs tomorrow and Sunday, 5 tabs the next 2 days, decrease by 1 every other day until gone 42 tablet 0   pregabalin (LYRICA) 50 MG capsule TAKE 1 CAPSULE BY MOUTH THREE TIMES DAILY 90 capsule 5   PRESCRIPTION MEDICATION CPAP     senna-docusate (SENOKOT-S) 8.6-50 MG tablet Take 1 tablet by mouth at bedtime as needed for mild constipation. 10 tablet 0   sucralfate (CARAFATE) 1 g tablet TAKE 1 TABLET BY MOUTH 4 TIMES DAILY WITH MEALS AND AT BEDTIME 360 tablet 4   traMADol (ULTRAM) 50 MG tablet Take 1 tablet (50 mg total) by mouth every 6 (six) hours as needed. 30 tablet 0   traZODone (DESYREL) 100 MG tablet TAKE 1 TABLET BY MOUTH AT BEDTIME AS NEEDED FOR SLEEP 90 tablet 0   vitamin A 96045 UNIT capsule Take 10,000 Units by mouth daily.     vitamin B-12 (CYANOCOBALAMIN) 1000 MCG tablet Take 1,000 mcg by mouth daily.     Vitamin E 400 units TABS Take 400 Units by mouth daily.      No current facility-administered medications for this visit.    Allergies as of 02/16/2022 - Review Complete 02/16/2022  Allergen Reaction Noted   Bee venom Anaphylaxis 02/22/2014   Crestor [rosuvastatin calcium] Shortness Of Breath and Swelling 03/03/2017   Fentanyl Itching and Hives 03/27/2015   Gabapentin Diarrhea 02/22/2014   Shellfish allergy Anaphylaxis and Swelling 03/20/2014   Fire ant  01/05/2022   Furosemide Nausea And Vomiting 01/01/2020   Buprenorphine hcl Itching 10/10/2015   Chlorhexidine gluconate Itching and Rash 08/13/2021   Simvastatin Diarrhea 04/25/2015    ROS:  General:  Negative for anorexia, weight loss, fever, chills, fatigue, weakness. ENT: Negative for hoarseness, difficulty swallowing , nasal congestion. CV: Negative for chest pain, angina, palpitations, dyspnea on exertion, peripheral edema.  Respiratory: Negative for dyspnea at rest, dyspnea on exertion, cough, sputum, wheezing.  GI: See history of present illness. GU:  Negative for dysuria, hematuria, urinary incontinence, urinary frequency, nocturnal urination.  Endo: Negative for unusual weight change.    Physical Examination:   BP 134/81   Pulse (!) 108   Temp 97.9 F (36.6 C) (Oral)   Ht 5\' 8"  (1.727 m)   Wt 250 lb (113.4 kg)   BMI 38.01 kg/m   General: Well-nourished, well-developed in no acute distress.  Eyes: No icterus. Conjunctivae pink. Lungs: Clear to auscultation bilaterally. Non-labored. Heart: Regular rate and rhythm, no murmurs rubs or gallops.  Abdomen: Bowel sounds are normal, Positive tenderness to one finger palpation while flexing the abdominal wall muscles, nondistended, no hepatosplenomegaly or masses, no abdominal bruits or hernia , no rebound or guarding.   Extremities: No lower extremity edema. No clubbing or deformities. Neuro: Alert and oriented x 3.  Grossly intact. Skin: Warm and dry, no jaundice.   Psych: Alert and cooperative, normal mood and affect.  Labs:    Imaging Studies: MR ANGIO HEAD WO CONTRAST  Result Date: 01/28/2022 CLINICAL DATA:  Hemisensory loss R20.0 (ICD-10-CM). Transient ischemic attack (TIA). Numbness and tingling of right  face R20.0, R20.2 (ICD-10-CM). EXAM: MRI HEAD WITHOUT CONTRAST MRA HEAD WITHOUT CONTRAST MRA NECK WITHOUT CONTRAST TECHNIQUE: Multiplanar, multi-echo pulse sequences of the brain and surrounding structures were acquired without intravenous contrast. Angiographic images of the Circle of Willis were acquired using MRA technique without intravenous contrast. Angiographic images of the neck were acquired using MRA technique  without intravenous contrast. Carotid stenosis measurements (when applicable) are obtained utilizing NASCET criteria, using the distal internal carotid diameter as the denominator. COMPARISON:  MRI of the brain January 14, 2019. FINDINGS: MRI HEAD FINDINGS Brain: No acute infarction, hemorrhage, hydrocephalus, extra-axial collection or mass lesion. No pathologic intracranial enhancement. No significant white matter disease. Mild parenchymal volume loss. Vascular: Normal flow voids. Skull and upper cervical spine: Normal marrow signal. Sinuses/Orbits: Mild mucosal thickening of left ethmoid cells and left sphenoid sinus. The orbits are maintained. Other: None. MRA HEAD FINDINGS Anterior circulation: The visualized portions of the distal cervical and intracranial internal carotid arteries are widely patent with normal flow related enhancement. The bilateral anterior cerebral arteries and middle cerebral arteries are widely patent with antegrade flow without high-grade flow-limiting stenosis or proximal branch occlusion. No intracranial aneurysm within the anterior circulation. Posterior circulation: The left vertebral artery is dominant. Hypoplastic right vertebral artery supplying mainly the right PICA with very the diminutive distal V4 segment. Vertebrobasilar junction and basilar artery are widely patent with antegrade flow without evidence of basilar stenosis or aneurysm. Posterior cerebral arteries are normal bilaterally. No intracranial aneurysm within the posterior circulation. Anatomic variants: Hypoplastic right vertebral artery. MRA NECK FINDINGS Aortic arch: Normal 3 vessel aortic branching pattern. The visualized subclavian arteries are normal. Right carotid system: Normal course and caliber without stenosis or evidence of dissection. Left carotid system: Normal course and caliber without stenosis or evidence of dissection. Vertebral arteries: Left dominant. Vertebral artery origins are normal. Vertebral  arteries are normal in course and caliber to the vertebrobasilar confluence without stenosis or evidence of dissection. IMPRESSION: 1. No acute intracranial abnormality. 2. Unremarkable MR angiogram of the head and neck. Electronically Signed   By: Baldemar Lenis M.D.   On: 01/28/2022 16:49   MR ANGIO NECK WO CONTRAST  Result Date: 01/28/2022 CLINICAL DATA:  Hemisensory loss R20.0 (ICD-10-CM). Transient ischemic attack (TIA). Numbness and tingling of right face R20.0, R20.2 (ICD-10-CM). EXAM: MRI HEAD WITHOUT CONTRAST MRA HEAD WITHOUT CONTRAST MRA NECK WITHOUT CONTRAST TECHNIQUE: Multiplanar, multi-echo pulse sequences of the brain and surrounding structures were acquired without intravenous contrast. Angiographic images of the Circle of Willis were acquired using MRA technique without intravenous contrast. Angiographic images of the neck were acquired using MRA technique without intravenous contrast. Carotid stenosis measurements (when applicable) are obtained utilizing NASCET criteria, using the distal internal carotid diameter as the denominator. COMPARISON:  MRI of the brain January 14, 2019. FINDINGS: MRI HEAD FINDINGS Brain: No acute infarction, hemorrhage, hydrocephalus, extra-axial collection or mass lesion. No pathologic intracranial enhancement. No significant white matter disease. Mild parenchymal volume loss. Vascular: Normal flow voids. Skull and upper cervical spine: Normal marrow signal. Sinuses/Orbits: Mild mucosal thickening of left ethmoid cells and left sphenoid sinus. The orbits are maintained. Other: None. MRA HEAD FINDINGS Anterior circulation: The visualized portions of the distal cervical and intracranial internal carotid arteries are widely patent with normal flow related enhancement. The bilateral anterior cerebral arteries and middle cerebral arteries are widely patent with antegrade flow without high-grade flow-limiting stenosis or proximal branch occlusion. No  intracranial aneurysm within the anterior circulation. Posterior circulation: The left  vertebral artery is dominant. Hypoplastic right vertebral artery supplying mainly the right PICA with very the diminutive distal V4 segment. Vertebrobasilar junction and basilar artery are widely patent with antegrade flow without evidence of basilar stenosis or aneurysm. Posterior cerebral arteries are normal bilaterally. No intracranial aneurysm within the posterior circulation. Anatomic variants: Hypoplastic right vertebral artery. MRA NECK FINDINGS Aortic arch: Normal 3 vessel aortic branching pattern. The visualized subclavian arteries are normal. Right carotid system: Normal course and caliber without stenosis or evidence of dissection. Left carotid system: Normal course and caliber without stenosis or evidence of dissection. Vertebral arteries: Left dominant. Vertebral artery origins are normal. Vertebral arteries are normal in course and caliber to the vertebrobasilar confluence without stenosis or evidence of dissection. IMPRESSION: 1. No acute intracranial abnormality. 2. Unremarkable MR angiogram of the head and neck. Electronically Signed   By: Baldemar Lenis M.D.   On: 01/28/2022 16:49   MR BRAIN WO CONTRAST  Result Date: 01/28/2022 CLINICAL DATA:  Hemisensory loss R20.0 (ICD-10-CM). Transient ischemic attack (TIA). Numbness and tingling of right face R20.0, R20.2 (ICD-10-CM). EXAM: MRI HEAD WITHOUT CONTRAST MRA HEAD WITHOUT CONTRAST MRA NECK WITHOUT CONTRAST TECHNIQUE: Multiplanar, multi-echo pulse sequences of the brain and surrounding structures were acquired without intravenous contrast. Angiographic images of the Circle of Willis were acquired using MRA technique without intravenous contrast. Angiographic images of the neck were acquired using MRA technique without intravenous contrast. Carotid stenosis measurements (when applicable) are obtained utilizing NASCET criteria, using the distal  internal carotid diameter as the denominator. COMPARISON:  MRI of the brain January 14, 2019. FINDINGS: MRI HEAD FINDINGS Brain: No acute infarction, hemorrhage, hydrocephalus, extra-axial collection or mass lesion. No pathologic intracranial enhancement. No significant white matter disease. Mild parenchymal volume loss. Vascular: Normal flow voids. Skull and upper cervical spine: Normal marrow signal. Sinuses/Orbits: Mild mucosal thickening of left ethmoid cells and left sphenoid sinus. The orbits are maintained. Other: None. MRA HEAD FINDINGS Anterior circulation: The visualized portions of the distal cervical and intracranial internal carotid arteries are widely patent with normal flow related enhancement. The bilateral anterior cerebral arteries and middle cerebral arteries are widely patent with antegrade flow without high-grade flow-limiting stenosis or proximal branch occlusion. No intracranial aneurysm within the anterior circulation. Posterior circulation: The left vertebral artery is dominant. Hypoplastic right vertebral artery supplying mainly the right PICA with very the diminutive distal V4 segment. Vertebrobasilar junction and basilar artery are widely patent with antegrade flow without evidence of basilar stenosis or aneurysm. Posterior cerebral arteries are normal bilaterally. No intracranial aneurysm within the posterior circulation. Anatomic variants: Hypoplastic right vertebral artery. MRA NECK FINDINGS Aortic arch: Normal 3 vessel aortic branching pattern. The visualized subclavian arteries are normal. Right carotid system: Normal course and caliber without stenosis or evidence of dissection. Left carotid system: Normal course and caliber without stenosis or evidence of dissection. Vertebral arteries: Left dominant. Vertebral artery origins are normal. Vertebral arteries are normal in course and caliber to the vertebrobasilar confluence without stenosis or evidence of dissection. IMPRESSION: 1. No  acute intracranial abnormality. 2. Unremarkable MR angiogram of the head and neck. Electronically Signed   By: Baldemar Lenis M.D.   On: 01/28/2022 16:49   MR CERVICAL SPINE WO CONTRAST  Result Date: 02/11/2022 CLINICAL DATA:  Hemisensory loss R20.0 (ICD-10-CM). Numbness and tingling of right face R20.0, R20.2 (ICD-10-CM). EXAM: MRI CERVICAL SPINE WITHOUT CONTRAST TECHNIQUE: Multiplanar, multisequence MR imaging of the cervical spine was performed. No intravenous contrast was administered. COMPARISON:  CT cervical spine April 13, 2006. FINDINGS: Alignment: Straightening of the cervical curvature. Small anterolisthesis of C4 over C5. Vertebrae: No fracture, evidence of discitis, or bone lesion. Postsurgical changes from C5-6 interbody fusion. Endplate degenerative changes at C6-7 and more pronounced at C7-T1. Cord: Normal signal and morphology. Posterior Fossa, vertebral arteries, paraspinal tissues: Negative. Disc levels: C2-3: Facet degenerative changes. No significant spinal canal or neural foraminal stenosis. C3-4: Posterior disc osteophyte complex resulting in mild spinal canal stenosis. Uncovertebral and facet degenerative change resulting in moderate bilateral neural foraminal narrowing. C4-5: Posterior disc osteophyte complex resulting in mild spinal canal stenosis. Uncovertebral and facet degenerative changes with prominent hypertrophy of the left facet joint resulting in severe left neural foraminal narrowing. C5-6: Status post fusion. No spinal canal stenosis. Residual uncovertebral degenerative changes resulting in mild left neural foraminal narrowing. C6-7: Posterior disc osteophyte complex resulting mild spinal canal stenosis. Uncovertebral and facet degenerative changes resulting in mild bilateral neural foraminal narrowing. C7-T1: Loss of disc height, posterior disc osteophyte complex resulting in mild spinal canal stenosis. Uncovertebral and facet degenerative changes resulting  severe bilateral neural foraminal narrowing. Degenerative changes have progressed since prior MRI, particularly at C7-T1. IMPRESSION: 1. Mild spinal canal stenosis at C3-4, C4-5, C5-6 and C6-7. 2. Severe bilateral neural foraminal narrowing at C7-T1 and on the left side at C4-5. 3. Moderate bilateral neural foraminal narrowing at C3-4. Electronically Signed   By: Baldemar Lenis M.D.   On: 02/11/2022 15:21   SLEEP STUDY DOCUMENTS  Result Date: 02/11/2022 Ordered by an unspecified provider.   Assessment and Plan:   Edward Schneider. is a 66 y.o. y/o male ho comes in today with muscular skeletal pain in his abdomen which is likely the cause of his abdominal pain for these years.  He has worsening pain wall flex the abdominal wall muscles.  He also reports that his back pain and has been constipated and uses his abdominal wall muscles to strain to have a bowel movement.  He will be given samples of  Trulance for his constipation and if he does well on it we can consider giving him a prescription.  The patient has been explained that his abdominal pain is musculoskeletal.  The patient has been explained the plan and agrees with it.     Midge Minium, MD. Clementeen Graham    Note: This dictation was prepared with Dragon dictation along with smaller phrase technology. Any transcriptional errors that result from this process are unintentional.

## 2022-02-16 NOTE — Telephone Encounter (Signed)
Is he still on the Pregabalin '50mg'$  three times a day? This can help with irritated nerves causing pins and needles sensation. There is room to increase this to potentially help with symptoms.  ?

## 2022-02-17 MED ORDER — PREGABALIN 100 MG PO CAPS: 100.0000 mg | ORAL_CAPSULE | Freq: Two times a day (BID) | ORAL | 3 refills | Status: DC

## 2022-02-17 NOTE — Telephone Encounter (Signed)
Pls let him know we will increase the Pregabalin to '100mg'$ : take 1 capsule twice a day. Instead of taking medication three times a day, he only takes it twice a day but higher dose. Thanks ?

## 2022-02-17 NOTE — Telephone Encounter (Signed)
Patient returned call to [facility]. ?

## 2022-02-17 NOTE — Telephone Encounter (Signed)
Pt called and told about new orders and to pick it up at Pharmacy. ?

## 2022-02-17 NOTE — Telephone Encounter (Signed)
Returned call to Pt and he reported that it would be okay to increase Pregabalin '50mg'$  three time a day for the the pins and needles sensation. ?

## 2022-02-19 ENCOUNTER — Telehealth: Payer: Self-pay

## 2022-02-19 NOTE — Telephone Encounter (Signed)
Patient aware that [facility] patient assistance has been approved.  ?

## 2022-02-22 ENCOUNTER — Other Ambulatory Visit: Payer: Self-pay | Admitting: Family Medicine

## 2022-02-22 ENCOUNTER — Other Ambulatory Visit: Payer: Self-pay | Admitting: Internal Medicine

## 2022-02-22 ENCOUNTER — Other Ambulatory Visit: Payer: Self-pay | Admitting: Nurse Practitioner

## 2022-02-22 NOTE — Patient Instructions (Addendum)
Mounjaro -- ask endo about changing over to this to help with diabetes and weight loss ? ?Abdominal Pain, Adult ?Many things can cause belly (abdominal) pain. Most times, belly pain is not dangerous. Many cases of belly pain can be watched and treated at home. Sometimes, though, belly pain is serious. Your doctor will try to find the cause of your belly pain. ?Follow these instructions at home: ? ?Medicines ?Take over-the-counter and prescription medicines only as told by your doctor. ?Do not take medicines that help you poop (laxatives) unless told by your doctor. ?General instructions ?Watch your belly pain for any changes. ?Drink enough fluid to keep your pee (urine) pale yellow. ?Keep all follow-up visits as told by your doctor. This is important. ?Contact a doctor if: ?Your belly pain changes or gets worse. ?You are not hungry, or you lose weight without trying. ?You are having trouble pooping (constipated) or have watery poop (diarrhea) for more than 2-3 days. ?You have pain when you pee or poop. ?Your belly pain wakes you up at night. ?Your pain gets worse with meals, after eating, or with certain foods. ?You are vomiting and cannot keep anything down. ?You have a fever. ?You have blood in your pee. ?Get help right away if: ?Your pain does not go away as soon as your doctor says it should. ?You cannot stop vomiting. ?Your pain is only in areas of your belly, such as the right side or the left lower part of the belly. ?You have bloody or black poop, or poop that looks like tar. ?You have very bad pain, cramping, or bloating in your belly. ?You have signs of not having enough fluid or water in your body (dehydration), such as: ?Dark pee, very little pee, or no pee. ?Cracked lips. ?Dry mouth. ?Sunken eyes. ?Sleepiness. ?Weakness. ?You have trouble breathing or chest pain. ?Summary ?Many cases of belly pain can be watched and treated at home. ?Watch your belly pain for any changes. ?Take over-the-counter and  prescription medicines only as told by your doctor. ?Contact a doctor if your belly pain changes or gets worse. ?Get help right away if you have very bad pain, cramping, or bloating in your belly. ?This information is not intended to replace advice given to you by your health care provider. Make sure you discuss any questions you have with your health care provider. ?Document Revised: 02/13/2019 Document Reviewed: 02/13/2019 ?Elsevier Patient Education ? [place]. ? ?

## 2022-02-23 ENCOUNTER — Encounter: Payer: Self-pay | Admitting: Primary Care

## 2022-02-23 ENCOUNTER — Ambulatory Visit (INDEPENDENT_AMBULATORY_CARE_PROVIDER_SITE_OTHER): Payer: Medicare Other | Admitting: Primary Care

## 2022-02-23 VITALS — BP 102/64 | HR 99 | Temp 98.0°F | Ht 68.0 in | Wt 251.6 lb

## 2022-02-23 DIAGNOSIS — J432 Centrilobular emphysema: Secondary | ICD-10-CM

## 2022-02-23 DIAGNOSIS — G4733 Obstructive sleep apnea (adult) (pediatric): Secondary | ICD-10-CM | POA: Diagnosis not present

## 2022-02-23 MED ORDER — BREZTRI AEROSPHERE 160-9-4.8 MCG/ACT IN AERO: 2.0000 | INHALATION_SPRAY | Freq: Two times a day (BID) | RESPIRATORY_TRACT | 3 refills | Status: DC

## 2022-02-23 MED ORDER — BREZTRI AEROSPHERE 160-9-4.8 MCG/ACT IN AERO: 2.0000 | INHALATION_SPRAY | Freq: Two times a day (BID) | RESPIRATORY_TRACT | 0 refills | Status: DC

## 2022-02-23 NOTE — Assessment & Plan Note (Addendum)
-   Shortness of breath has improved but continues to have moderate dyspnea symptoms with activities. Breathing improves with oral steroids. No acute respiratory symptoms today in office. Lungs were clear on exam. He has tried both Mining engineer and Trelegy 118mg. Gave patient sample and sent in RX Brezteri Aerosphere two puffs twice daily.  ?

## 2022-02-23 NOTE — Patient Instructions (Addendum)
Recommendations: ?- Stop STIOLTO and Trelegy  ?- Start SunGard- take two puffs morning and evening (do not use more than prescribed) ?- Continue to use Albuterol rescue inhaler 2 puffs every 4-6 hours for breakthrough shortness of breath ?- We will place an order for you to receive new CPAP machine and adjust pressure setting according to the titration study you had ?- Discuss weight loss options with your PCP this week ? ?Rx: ?Arnell Sieving (notify us if not covered by insuance) ? ?Orders: ?- New BIPAP machine 22/18 with small resmed airfit P30i nasal pillow mask  ? ?Follow-up: ?- 3 months with Dr. Ander Slade  ?

## 2022-02-23 NOTE — Assessment & Plan Note (Addendum)
-   Patient has been having difficulty tolerating CPAP. He had CPAP titration study on 02/10/22 which showed patient required BIPAP with pressure setting 22/18. Placing an order for auto BIPAP (IPAP 5-22/EPAP 5-18cm h20). Needs new machine. FU in 3 months or sooner if needed.  ?

## 2022-02-23 NOTE — Progress Notes (Signed)
? ?'@Patient'$  ID: [name], male    DOB: 09/17/1956, 66 y.o.   MRN: [id number] ? ?Chief Complaint  ?Patient presents with  ? Follow-up  ? ? ?Referring provider: ?[name], NP ? ?HPI: ?66 year old male, former smoker. PMH significant for HTN, CAD, afib, GERD. Patient of Dr. Ander Slade.  ? ?Previous [facility] pulmonary encounter:  ?11/21/2021 ?Patient presents today for acute OV. He reports experiencing increased shortness of breath x 2 weeks. He has associated chest tightness and wheezing. He was using Anoro and Stiolto at different times. He most recently ran out Anoro around January 8th. He does not like powder inhalers. He thinks his shortness of breath is related to his CPAP use, he feels worse when using. He has been on CPAP for over a year. He reports having right arm and leg numbness/tingling. He has some weakness right lower extremity residual from an old back surgery. Denies f/c/s, changes in speech, confusion, headaches, nasal congestion, productive cough, chest discomfort.  ? ?12/19/2021 ?Patient presents today 1 month follow-up. Hx emphysema, sleep apnea. During last visit patient reports increased shortness of breath with associated chest tightness and wheezing since being of LABA/LAMA. Instructed him to resume daily use of his Stiolto Respimat inhaler. If symptoms persist would get chest imaging. He was referred to neurology d/t numbness in right arm/leg, he has an apt with Dr. Posey Pronto end of March.  ? ?He still having shortness of breath, feels it is related to his CPAP. He feels no improvement in his breathing since resuming Stiolto, however, he does report wheezing stopping since he restarted. He reports compliance with CPAP every night.  He has gained 7 lbs since last visit. He has been on diuretic in the past but not current.  ? ?01/07/2022 ?Patient presents today for 2 week follow-up. During last visit on 12/19/21 he had acute COPD symptoms. CXR without active cardiopulmonary disease. Wheezing  resolved on Stiolto. Reported weight gain and abd swelling, started patient on lasix '20mg'$  BID x 5 days. Labs were wnl. Dif tolerating CPAP, ordered titration study. Overall he is doing well today. He needs refill of inhalers. Breathing better. No shortness of breath. He has been walking a mile a day. He only experiencing dyspnea symptoms with long walks. He has an apt with GI in May. CT abdomen showed small fat containing umbilical hernia.  ? ? ?02/23/2022- Interim hx  ?Patient presents today for 1 month follow-up. Dyspnea improved with short course of lasix '20mg'$  BID x 5 days. Labs were wnl. Maintained on Stiolto for emphysema. Patient reports dif tolerating CPAP. He had titration study on 4/25 but results have not been read by sleep doctor. He reports his breathing improved on oral steroids, recommended changing stiolto to trial trelegy.  ? ?He is currently using Stiolto Respimat but does not feel it is working. He was not able to use trelegy for more than 1-2 days d/t powder. He stays out of breath especially with activities. Completed PSG/CPAP titration on 02/10/22, required BIPAP pressure setting 22/18 to eliminate apnea's and snoring. No supplemental oxygen needed. Current machine is >71 years old and has burning smell coming from it when using.  ? ?Allergies  ?Allergen Reactions  ? Bee Venom Anaphylaxis  ? Crestor [Rosuvastatin Calcium] Shortness Of Breath and Swelling  ? Fentanyl Itching and Hives  ?  blisters ?Patch  ? Gabapentin Diarrhea  ?  Severe diarrhea which caused incontinence, loss of appetite and weight loss.  ? Shellfish Allergy Anaphylaxis  and Swelling  ?  Shrimp causes throat to swell and tingling in tongue.   ? Fire Dynegy   ? Furosemide Nausea And Vomiting  ? Buprenorphine Hcl Itching  ? Chlorhexidine Gluconate Itching and Rash  ? Simvastatin Diarrhea  ? ? ?Immunization History  ?Administered Date(s) Administered  ? Influenza,inj,Quad PF,6+ Mos 08/21/2015, 09/24/2016, 07/12/2017, 06/23/2018,  08/04/2018, 07/17/2020, 07/18/2021  ? Influenza-Unspecified 07/19/2014, 08/21/2015, 09/24/2016, 07/12/2017, 06/23/2018, 08/04/2018, 07/11/2019, 07/17/2020, 07/18/2021  ? Pneumococcal Polysaccharide-23 06/07/2012, 05/30/2020  ? Td 10/28/2007  ? Tdap 04/25/2018  ? Zoster Recombinat (Shingrix) 07/11/2019, 09/13/2019  ? ? ?Past Medical History:  ?Diagnosis Date  ? Allergy   ? Aortic atherosclerosis ([place])   ? Asthma   ? C. difficile diarrhea   ? Chronic pain   ? Collagenous colitis   ? Coronary artery disease   ? a.) PCI with 2.75 x 18 mm Resolute Onyx DES x 1 to prox/mid LAD on 09/05/2019  ? DDD (degenerative disc disease), cervical   ? DDD (degenerative disc disease), lumbar   ? GERD (gastroesophageal reflux disease)   ? Grade I diastolic dysfunction   ? Hepatic steatosis   ? Hyperlipidemia   ? Hypertension   ? Liver cancer ([place]) 03/2015  ? Migraines   ? Myocardial infarction [facility])    ? OSA on CPAP   ? Seizures ([place])   ? several as child when sick.  None since age 68  ? Stroke [facility])   ? 'mini-stroke" 30 yrs ago. no deficits.  ? T2DM (type 2 diabetes mellitus) ([place])   ? Wears dentures   ? full upper and lower  ? ? ?Tobacco History: ?Social History  ? ?Tobacco Use  ?Smoking Status Former  ? Packs/day: 2.00  ? Years: 50.00  ? Pack years: 100.00  ? Types: Cigarettes  ? Quit date: 2011  ? Years since quitting: 12.3  ?Smokeless Tobacco Never  ? ?Counseling given: Not Answered ? ? ?Outpatient Medications Prior to Visit  ?Medication Sig Dispense Refill  ? acetaminophen (TYLENOL) 650 MG CR tablet Take 1,300 mg by mouth every 8 (eight) hours.    ? albuterol (VENTOLIN HFA) 108 (90 Base) MCG/ACT inhaler Inhale 2 puffs into the lungs every 6 (six) hours as needed for wheezing or shortness of breath. 16 g 3  ? Alcohol Swabs (PHARMACIST CHOICE ALCOHOL) PADS SMARTSIG:1 Each Topical 4 Times Daily    ? Ascorbic Acid (VITAMIN C) 1000 MG tablet Take 1,000 mg by mouth daily.     ? aspirin EC 81 MG tablet Take 81 mg by mouth daily. Swallow  whole.    ? atorvastatin (LIPITOR) 80 MG tablet TAKE 1 TABLET BY MOUTH AT BEDTIME 90 tablet 2  ? benazepril (LOTENSIN) 10 MG tablet Take 1 tablet by mouth once daily 90 tablet 2  ? calcium carbonate (OS-CAL) 600 MG TABS tablet Take 600 mg by mouth daily.     ? cephALEXin (KEFLEX) 500 MG capsule Take 500 mg by mouth 2 (two) times daily.    ? cetirizine (ZYRTEC) 10 MG tablet Take 1 tablet (10 mg total) by mouth daily. 90 tablet 1  ? Cholecalciferol (VITAMIN D3) 25 MCG (1000 UT) CHEW Chew 1,000 Units by mouth daily.     ? CINNAMON PO Take 1,000 mg by mouth 2 (two) times daily.     ? clobetasol cream (TEMOVATE) 6.96 % Apply 1 application topically 2 (two) times daily. 30 g 0  ? COMFORT EZ PEN NEEDLES 32G X 4 MM MISC     ?  Continuous Blood Gluc Sensor (FREESTYLE LIBRE 2 SENSOR) MISC Use as instructed to check blood sugar daily 2 each 2  ? cyclobenzaprine (FLEXERIL) 10 MG tablet Take 1 tablet (10 mg total) by mouth 3 (three) times daily as needed for muscle spasms. 60 tablet 1  ? dapagliflozin propanediol (FARXIGA) 10 MG TABS tablet Take 1 tablet (10 mg total) by mouth daily before breakfast. 90 tablet 3  ? diclofenac Sodium (VOLTAREN) 1 % GEL Apply 2 g topically 4 (four) times daily as needed (pain).     ? diphenhydrAMINE (BENADRYL) 25 mg capsule Take 50 mg by mouth in the morning, at noon, and at bedtime.    ? docusate sodium (COLACE) 100 MG capsule Take 1 capsule (100 mg total) by mouth 2 (two) times daily. 20 capsule 0  ? doxepin (SINEQUAN) 25 MG capsule Take 50 mg by mouth at bedtime.     ? doxepin (SINEQUAN) 50 MG capsule Take 50-100 mg by mouth at bedtime.    ? Dulaglutide (TRULICITY) 3 WU/8.8BV SOPN Inject 3 mg as directed once a week. 6 mL 3  ? DULoxetine (CYMBALTA) 30 MG capsule Take 30-60 mg by mouth daily.    ? DULoxetine (CYMBALTA) 60 MG capsule Take 1 capsule (60 mg total) by mouth at bedtime. 90 capsule 4  ? EPINEPHrine 0.3 mg/0.3 mL IJ SOAJ injection Inject 0.3 mg into the muscle as needed for anaphylaxis.  1 each 12  ? ezetimibe (ZETIA) 10 MG tablet Take 1 tablet by mouth once daily 30 tablet 3  ? famotidine (PEPCID) 20 MG tablet Take 1 tablet (20 mg total) by mouth 2 (two) times daily. 60 tablet 2  ? Ferrous Glucona

## 2022-02-23 NOTE — Telephone Encounter (Signed)
Request from different pharmacy- attempted to verify change with patient- no answer- remainder of rx forwarded to new pharmacy ?Requested Prescriptions  ?Pending Prescriptions Disp Refills  ?? cetirizine (ZYRTEC) 10 MG tablet [Pharmacy Med Name: CETIRIZINE '10MG'$  TABLETS] 90 tablet 0  ?  Sig: TAKE 1 TABLET BY MOUTH EVERY DAY  ?  ? Ear, Nose, and Throat:  Antihistamines 2 Passed - 02/22/2022  2:47 PM  ?  ?  Passed - Cr in normal range and within 360 days  ?  Creat  ?Date Value Ref Range Status  ?12/19/2021 1.10 0.70 - 1.35 mg/dL Final  ? ?Creatinine, Ser  ?Date Value Ref Range Status  ?12/31/2021 1.21 0.76 - 1.27 mg/dL Final  ?   ?  ?  Passed - Valid encounter within last 12 months  ?  Recent Outpatient Visits   ?      ? 1 month ago Generalized abdominal pain  ? [facility] [place], [place] T, NP  ? 2 months ago Generalized abdominal pain  ? [facility] [place], Venetie T, NP  ? 2 months ago Allergic reaction to shellfish  ? [place] P, DO  ? 4 months ago Uncontrolled type 2 diabetes mellitus with hyperglycemia ([place])  ? [place], [place] T, NP  ? 6 months ago Impetigo  ? [facility], Scheryl Darter, NP  ?  ?  ?Future Appointments   ?        ? In 2 days [name], NP [employer]  ? In 1 month End, Harrell Gave, MD [facility]  ? In 1 month Cannady, Barbaraann Faster, NP [employer]  ?  ? ?  ?  ?  ? ?

## 2022-02-23 NOTE — Telephone Encounter (Signed)
Rx 01/10/22 #90- too soon ?Requested Prescriptions  ?Pending Prescriptions Disp Refills  ?? traZODone (DESYREL) 100 MG tablet [Pharmacy Med Name: traZODone HCl 100 MG Oral Tablet] 90 tablet 0  ?  Sig: TAKE 1 TABLET BY MOUTH AT BEDTIME AS NEEDED FOR SLEEP  ?  ? Psychiatry: Antidepressants - Serotonin Modulator Passed - 02/22/2022  2:43 PM  ?  ?  Passed - Completed PHQ-2 or PHQ-9 in the last 360 days  ?  ?  Passed - Valid encounter within last 6 months  ?  Recent Outpatient Visits   ?      ? 1 month ago Generalized abdominal pain  ? [facility] [place], [place] T, NP  ? 2 months ago Generalized abdominal pain  ? [facility] [place], [place]erry T, NP  ? 2 months ago Allergic reaction to shellfish  ? [place] P, DO  ? 4 months ago Uncontrolled type 2 diabetes mellitus with hyperglycemia ([place])  ? [place], [place] T, NP  ? 6 months ago Impetigo  ? [facility], Scheryl Darter, NP  ?  ?  ?Future Appointments   ?        ? In 2 days [name], NP [employer] [facility]  ? In 1 month End, Harrell Gave, MD [facility]  ? In 1 month Cannady, Barbaraann Faster, NP [employer] [facility]  ?  ? ?  ?  ?  ? ? ?

## 2022-02-25 ENCOUNTER — Ambulatory Visit (INDEPENDENT_AMBULATORY_CARE_PROVIDER_SITE_OTHER): Payer: Medicare Other | Admitting: Nurse Practitioner

## 2022-02-25 ENCOUNTER — Encounter: Payer: Self-pay | Admitting: Nurse Practitioner

## 2022-02-25 DIAGNOSIS — R1084 Generalized abdominal pain: Secondary | ICD-10-CM

## 2022-02-25 NOTE — Assessment & Plan Note (Signed)
BMI 38.41with HTN, CAD, T2DM -- recommend she discuss Mounjaro with endo.  Recommended eating smaller high protein, low fat meals more frequently and exercising 30 mins a day 5 times a week with a goal of 10-15lb weight loss in the next 3 months. Patient voiced their understanding and motivation to adhere to these recommendations. ? ?

## 2022-02-25 NOTE — Assessment & Plan Note (Signed)
Acute and improved at this time.  Continue collaboration with GI, appreciated their input.  Suspect this may be more poorly controlled GERD or related to possible gastroparesis with diabetes, as smaller meals have improved pain.  Provided education on diet and diabetes, reiterated information.   ?

## 2022-02-25 NOTE — Progress Notes (Signed)
? ?BP 102/69   Pulse 98   Temp 98 ?F (36.7 ?C) (Oral)   Ht _0  (1.727 m)   Wt 252 lb 9.6 oz (114.6 kg)   SpO2 95%   BMI 38.41 kg/m?   ? ?Subjective:  ? ? Patient ID: Edward Tetrault., male    DOB: 1955/10/30, 66 y.o.   MRN: [id number] ? ?HPI: ?Edward Summons. is a 66 y.o. male ? ?Chief Complaint  ?Patient presents with  ? Abdominal Pain  ?  Patient is here to follow up on Abdominal Pain. Patient states the pain is now gone away. Patient states he will still have some discomfort every once and a while. Patient states he has changed his eating habits.   ? Weight Loss  ?  Patient states his breathing doctor recommended patient to discuss with his PCP about having the weight loss injection to help with weight loss and his diabetes. Patient states he has been on his current diet for a while and he isn't noticing any changes.   ? ?ABDOMINAL PAIN  ?Follow-up for abdominal pain.  He reports this is improved, saw GI on 02/16/22 and they told him to stop straining when goes to bathroom and this has overall helped. ? ?CT on 12/25/21 noted ongoing small fat containing umbilical hernia, but no other acute findings to determine underlying cause of his pain.   ? ?Started on diet 6 months ago and is trying to walk (going to [employer]), so he can get down to 200 lbs.  He reports pulmonary told him to ask PCP about weight loss injection. Currently has been on Trulicity for diabetes, has been on for some time. Continues on Carafate and Pepcid daily + Protonix for GERD. ? ?Last endoscopy & colonoscopy was 06/18/20. ?Status: improved ?Treatments attempted: diet changes ?Fever: no ?Nausea: no ?Vomiting: no ?Weight loss: no ?Decreased appetite: none ?Diarrhea: no  ?Constipation: no ?Blood in stool: when wipes only at times ?Heartburn: no ?Jaundice: no ?Rash: no ?Dysuria/urinary frequency: no ?Hematuria: no ?History of sexually transmitted disease: no ?Recurrent NSAID use: no  ? ?Relevant past medical, surgical, family and social  history reviewed and updated as indicated. Interim medical history since our last visit reviewed. ?Allergies and medications reviewed and updated. ? ?Review of Systems  ?Constitutional: Negative.   ?Respiratory:  Negative for apnea, cough, chest tightness, shortness of breath and wheezing.   ?Cardiovascular:  Negative for chest pain, palpitations and leg swelling.  ?Gastrointestinal:  Negative for abdominal distention, abdominal pain, constipation, diarrhea, nausea and vomiting.  ?Endocrine: Negative.   ?Neurological: Negative.   ?Psychiatric/Behavioral: Negative.    ? ?Per HPI unless specifically indicated above ? ?   ?Objective:  ?  ?BP 102/69   Pulse 98   Temp 98 ?F (36.7 ?C) (Oral)   Ht _1  (1.727 m)   Wt 252 lb 9.6 oz (114.6 kg)   SpO2 95%   BMI 38.41 kg/m?   ?Wt Readings from Last 3 Encounters:  ?02/25/22 252 lb 9.6 oz (114.6 kg)  ?02/23/22 251 lb 9.6 oz (114.1 kg)  ?02/16/22 250 lb (113.4 kg)  ?  ?Physical Exam ?Vitals and nursing note reviewed.  ?Constitutional:   ?   General: He is awake. He is not in acute distress. ?   Appearance: He is well-developed and well-groomed. He is obese. He is not ill-appearing or toxic-appearing.  ?HENT:  ?   Head: Normocephalic and atraumatic.  ?   Right Ear: Hearing normal. No  drainage.  ?   Left Ear: Hearing normal. No drainage.  ?Eyes:  ?   General: Lids are normal.     ?   Right eye: No discharge.     ?   Left eye: No discharge.  ?   Conjunctiva/sclera: Conjunctivae normal.  ?   Pupils: Pupils are equal, round, and reactive to light.  ?Neck:  ?   Thyroid: No thyromegaly.  ?   Vascular: No carotid bruit.  ?   Trachea: Trachea normal.  ?Cardiovascular:  ?   Rate and Rhythm: Normal rate and regular rhythm.  ?   Heart sounds: Normal heart sounds, S1 normal and S2 normal. No murmur heard. ?  No gallop.  ?Pulmonary:  ?   Effort: Pulmonary effort is normal. No accessory muscle usage or respiratory distress.  ?   Breath sounds: Normal breath sounds.  ?Abdominal:  ?    General: Bowel sounds are normal. There is no distension.  ?   Palpations: Abdomen is soft.  ?   Tenderness: There is no abdominal tenderness. There is no right CVA tenderness, left CVA tenderness, guarding or rebound. Negative signs include Murphy's sign.  ?   Hernia: A hernia is present. Hernia is present in the umbilical area. There is no hernia in the ventral area.  ?   Comments: No tenderness on exam today.  Abdomen very rotund.  ?Musculoskeletal:     ?   General: Normal range of motion.  ?   Cervical back: Normal range of motion and neck supple.  ?   Right lower leg: No edema.  ?   Left lower leg: No edema.  ?Skin: ?   General: Skin is warm and dry.  ?   Capillary Refill: Capillary refill takes less than 2 seconds.  ?   Findings: No rash.  ?   Comments: Scattered pale bruising to upper extremities and abrasions -- baseline.    ?Neurological:  ?   Mental Status: He is alert and oriented to person, place, and time.  ?   Motor: Motor function is intact.  ?   Coordination: Coordination is intact.  ?   Gait: Gait is intact.  ?   Deep Tendon Reflexes: Reflexes are normal and symmetric.  ?Psychiatric:     ?   Attention and Perception: Attention normal.     ?   Mood and Affect: Mood normal.     ?   Behavior: Behavior normal. Behavior is cooperative.     ?   Thought Content: Thought content normal.  ? ? ?Results for orders placed or performed in visit on 12/31/21  ?CBC with Differential/Platelet  ?Result Value Ref Range  ? WBC 10.0 3.4 - 10.8 x10E3/uL  ? RBC 4.85 4.14 - 5.80 x10E6/uL  ? Hemoglobin 13.0 13.0 - 17.7 g/dL  ? Hematocrit 39.9 37.5 - 51.0 %  ? MCV 82 79 - 97 fL  ? MCH 26.8 26.6 - 33.0 pg  ? MCHC 32.6 31.5 - 35.7 g/dL  ? RDW 17.0 (H) 11.6 - 15.4 %  ? Platelets 197 150 - 450 x10E3/uL  ? Neutrophils 71 Not Estab. %  ? Lymphs 20 Not Estab. %  ? Monocytes 6 Not Estab. %  ? Eos 1 Not Estab. %  ? Basos 1 Not Estab. %  ? Neutrophils Absolute 7.2 (H) 1.4 - 7.0 x10E3/uL  ? Lymphocytes Absolute 2.0 0.7 - 3.1 x10E3/uL   ? Monocytes Absolute 0.6 0.1 - 0.9 x10E3/uL  ? EOS (ABSOLUTE)  0.1 0.0 - 0.4 x10E3/uL  ? Basophils Absolute 0.1 0.0 - 0.2 x10E3/uL  ? Immature Granulocytes 1 Not Estab. %  ? Immature Grans (Abs) 0.1 0.0 - 0.1 x10E3/uL  ?Comprehensive metabolic panel  ?Result Value Ref Range  ? Glucose 340 (H) 70 - 99 mg/dL  ? BUN 18 8 - 27 mg/dL  ? Creatinine, Ser 1.21 0.76 - 1.27 mg/dL  ? eGFR 67 >59 mL/min/1.73  ? BUN/Creatinine Ratio 15 10 - 24  ? Sodium 132 (L) 134 - 144 mmol/L  ? Potassium 5.5 (H) 3.5 - 5.2 mmol/L  ? Chloride 97 96 - 106 mmol/L  ? CO2 17 (L) 20 - 29 mmol/L  ? Calcium 10.2 8.6 - 10.2 mg/dL  ? Total Protein 7.4 6.0 - 8.5 g/dL  ? Albumin 4.6 3.8 - 4.8 g/dL  ? Globulin, Total 2.8 1.5 - 4.5 g/dL  ? Albumin/Globulin Ratio 1.6 1.2 - 2.2  ? Bilirubin Total 0.6 0.0 - 1.2 mg/dL  ? Alkaline Phosphatase 77 44 - 121 IU/L  ? AST 32 0 - 40 IU/L  ? ALT 39 0 - 44 IU/L  ?ANA w/Reflex if Positive  ?Result Value Ref Range  ? Anti Nuclear Antibody (ANA) Negative Negative  ?C-reactive protein  ?Result Value Ref Range  ? CRP 3 0 - 10 mg/L  ?Sed Rate (ESR)  ?Result Value Ref Range  ? Sed Rate 18 0 - 30 mm/hr  ?Lipase  ?Result Value Ref Range  ? Lipase 33 13 - 78 U/L  ?Amylase  ?Result Value Ref Range  ? Amylase 50 31 - 110 U/L  ?Gamma GT  ?Result Value Ref Range  ? GGT 98 (H) 0 - 65 IU/L  ? ?   ?Assessment & Plan:  ? ?Problem List Items Addressed This Visit   ? ?  ? Other  ? Generalized abdominal pain  ?  Acute and improved at this time.  Continue collaboration with GI, appreciated their input.  Suspect this may be more poorly controlled GERD or related to possible gastroparesis with diabetes, as smaller meals have improved pain.  Provided education on diet and diabetes, reiterated information.   ? ?  ?  ? Morbid obesity ([place]) - Primary  ?  BMI 38.41with HTN, CAD, T2DM -- recommend she discuss Mounjaro with endo.  Recommended eating smaller high protein, low fat meals more frequently and exercising 30 mins a day 5 times a week with a  goal of 10-15lb weight loss in the next 3 months. Patient voiced their understanding and motivation to adhere to these recommendations. ? ? ?  ?  ?  ? ?Follow up plan: ?Return for as scheduled 04/17/22. ? ? ? ? ? ?

## 2022-03-02 ENCOUNTER — Telehealth: Payer: Self-pay

## 2022-03-02 NOTE — Chronic Care Management (AMB) (Signed)
Chronic Care Management Pharmacy Assistant   Name: Tonatiuh Mallon.  MRN: [id number] DOB: 1956-03-17  Reason for Encounter: Disease State General    Recent office visits:  02/25/22-Jolene T. Cannady, NP (PCP) Seen for weight loss and abdominal pain. Return for as scheduled. 12/31/21-Jolene T. Ned Card, NP (PCP) Seen for abdominal pain. Labs ordered. Follow up in 8 weeks. 12/25/21-Jolene T. Ned Card, NP (PCP) Seen for pain. Labs ordered. Return as scheduled on 12/31/21. 12/12/21-Megan Annia Friendly, DO. Seen for a blister. Given triamcinolone acetonide (KENALOG-40) injection 40 mg. Labs ordered. Follow up in 1-2 weeks. 10/17/21-Jolene T. Ned Card, NP (PCP) General follow up visit. Labs ordered. Follow up in 6 months.  Recent consult visits:  02/23/22-Elizabeth Teresita Madura, NP (Pulmonology) Follow up visit.Stop STIOLTO and Trelegy and Start Brezteri inhaler two puffs twice daily. Follow up in 3 months. 02/16/22-Darren Allen Norris, MD (Gastroenterology) Initial visit. Samples of Trulance was given. 02/13/22-Michal Broadus John Menz(Orthopedic surgery) Notes not available. 02/12/22-Elizabeth Teresita Madura, NP (Pulmonology) Follow up visit. 01/23/22-Cadence H. Jorene Minors (Cardiology) Seen for chest tightness. Follow up in 1 year. 01/07/22-Elizabeth Teresita Madura, NP (Pulmonology) Follow up visit. Follow up in 3 months. 03/520/23-Donika K. Posey Pronto DO (Neurology) Consultation visit. MRI ordered of head and neck and brain without contrast. 12/30/21-Denny Christian Mate, MD (General surgery) Initial visit. Abdominal ultrasound ordered. 12/19/21-Elizabeth Teresita Madura, NP (Pulmonology) Follow up visit. Start on Lasix 20 mg twice daily for 5 days. Follow up in 2 weeks. 11/21/21-Elizabeth Teresita Madura, NP (Pulmonology) Follow up visit. Resume on Stiolto. Follow up in 1 month. 10/02/21-Christopher End, MD (Cardiology) Follow up coronary artery disease. Follow up in 6 months. 09/29/21-Gregory Crisp (Pain medicine) Notes not  available. 09/10/21-James P Hooten (Orthopedic surgery) Notes not available. 09/08/21-James P Hooten (Orthopedic surgery) Notes not available. 09/04/21-Shamleffer, Melanie Crazier, MD (Endocrinology) Initial visit. Increase Trulicity 3 mg weekly.Increase Farxiga to 10 mg, 1 tablet with Breakfast. Follow up in 6 months.  Hospital visits:  None in previous 6 months  Medications: Outpatient Encounter Medications as of 03/02/2022  Medication Sig   acetaminophen (TYLENOL) 650 MG CR tablet Take 1,300 mg by mouth every 8 (eight) hours.   albuterol (VENTOLIN HFA) 108 (90 Base) MCG/ACT inhaler Inhale 2 puffs into the lungs every 6 (six) hours as needed for wheezing or shortness of breath.   Alcohol Swabs (PHARMACIST CHOICE ALCOHOL) PADS SMARTSIG:1 Each Topical 4 Times Daily   Ascorbic Acid (VITAMIN C) 1000 MG tablet Take 1,000 mg by mouth daily.    aspirin EC 81 MG tablet Take 81 mg by mouth daily. Swallow whole.   atorvastatin (LIPITOR) 80 MG tablet TAKE 1 TABLET BY MOUTH AT BEDTIME   benazepril (LOTENSIN) 10 MG tablet Take 1 tablet by mouth once daily   Budeson-Glycopyrrol-Formoterol (BREZTRI AEROSPHERE) 160-9-4.8 MCG/ACT AERO Inhale 2 puffs into the lungs 2 (two) times daily.   Budeson-Glycopyrrol-Formoterol (BREZTRI AEROSPHERE) 160-9-4.8 MCG/ACT AERO Inhale 2 puffs into the lungs in the morning and at bedtime.   calcium carbonate (OS-CAL) 600 MG TABS tablet Take 600 mg by mouth daily.    cephALEXin (KEFLEX) 500 MG capsule Take 500 mg by mouth 2 (two) times daily.   cetirizine (ZYRTEC) 10 MG tablet TAKE 1 TABLET BY MOUTH EVERY DAY   Cholecalciferol (VITAMIN D3) 25 MCG (1000 UT) CHEW Chew 1,000 Units by mouth daily.    CINNAMON PO Take 1,000 mg by mouth 2 (two) times daily.    clobetasol cream (TEMOVATE) 7.41 % Apply 1 application topically 2 (two) times daily.   COMFORT  EZ PEN NEEDLES 32G X 4 MM MISC    Continuous Blood Gluc Sensor (FREESTYLE LIBRE 2 SENSOR) MISC Use as instructed to check blood  sugar daily   cyclobenzaprine (FLEXERIL) 10 MG tablet Take 1 tablet (10 mg total) by mouth 3 (three) times daily as needed for muscle spasms.   dapagliflozin propanediol (FARXIGA) 10 MG TABS tablet Take 1 tablet (10 mg total) by mouth daily before breakfast.   diclofenac Sodium (VOLTAREN) 1 % GEL Apply 2 g topically 4 (four) times daily as needed (pain).    diphenhydrAMINE (BENADRYL) 25 mg capsule Take 50 mg by mouth in the morning, at noon, and at bedtime.   docusate sodium (COLACE) 100 MG capsule Take 1 capsule (100 mg total) by mouth 2 (two) times daily.   doxepin (SINEQUAN) 25 MG capsule Take 50 mg by mouth at bedtime.    doxepin (SINEQUAN) 50 MG capsule Take 50-100 mg by mouth at bedtime.   Dulaglutide (TRULICITY) 3 DP/8.2UM SOPN Inject 3 mg as directed once a week.   DULoxetine (CYMBALTA) 30 MG capsule Take 30-60 mg by mouth daily.   DULoxetine (CYMBALTA) 60 MG capsule Take 1 capsule (60 mg total) by mouth at bedtime.   EPINEPHrine 0.3 mg/0.3 mL IJ SOAJ injection Inject 0.3 mg into the muscle as needed for anaphylaxis.   ezetimibe (ZETIA) 10 MG tablet Take 1 tablet by mouth once daily   famotidine (PEPCID) 20 MG tablet Take 1 tablet (20 mg total) by mouth 2 (two) times daily.   Ferrous Gluconate-C-Folic Acid (IRON-C PO) Take 30 mg by mouth daily.   furosemide (LASIX) 20 MG tablet Take 1 tablet (20 mg total) by mouth 2 (two) times daily.   Ginger, Zingiber officinalis, (GINGER PO) Take 1 tablet by mouth daily.   Ginseng 100 MG CAPS Take 100 mg by mouth daily.    Insulin Glargine (BASAGLAR KWIKPEN) 100 UNIT/ML Inject 32 Units into the skin at bedtime.   Insulin Glargine (BASAGLAR KWIKPEN) 100 UNIT/ML Inject 32 Units into the skin daily.   insulin lispro (HUMALOG KWIKPEN) 200 UNIT/ML KwikPen Max daily 60 units   Insulin Lispro-aabc (LYUMJEV KWIKPEN) 100 UNIT/ML KwikPen 22 units with Breakfast and continue 16 units with Lunch and 16 units with supper   isosorbide mononitrate (IMDUR) 60 MG 24  hr tablet Take 1 tablet by mouth once daily   lidocaine (XYLOCAINE) 2 % solution Use as directed 15 mLs in the mouth or throat as needed for mouth pain.   mometasone (ELOCON) 0.1 % ointment Apply topically 2 (two) times daily as needed.   Multiple Vitamins-Minerals (CENTRUM SILVER 50+MEN) TABS Take 1 tablet by mouth daily.   mupirocin ointment (BACTROBAN) 2 % Apply 1 application topically daily as needed.   NARCAN 4 MG/0.1ML LIQD nasal spray kit Place 0.4 mg into the nose as needed (opioid overdose).    nitroGLYCERIN (NITROSTAT) 0.4 MG SL tablet Place 0.4 mg under the tongue every 5 (five) minutes as needed for chest pain.    Omega-3 1000 MG CAPS Take 1,000 mg by mouth daily.    oxyCODONE 10 MG TABS Take 1-1.5 tablets (10-15 mg total) by mouth every 3 (three) hours as needed for severe pain (pain score 7-10).   pantoprazole (PROTONIX) 40 MG tablet Take 1 tablet by mouth twice daily   Plecanatide (TRULANCE) 3 MG TABS Take 1 tablet by mouth daily.   predniSONE (DELTASONE) 10 MG tablet 6 tabs tomorrow and Sunday, 5 tabs the next 2 days, decrease by 1 every  other day until gone (Patient not taking: Reported on 02/25/2022)   pregabalin (LYRICA) 100 MG capsule Take 1 capsule (100 mg total) by mouth 2 (two) times daily.   PRESCRIPTION MEDICATION CPAP   senna-docusate (SENOKOT-S) 8.6-50 MG tablet Take 1 tablet by mouth at bedtime as needed for mild constipation.   sucralfate (CARAFATE) 1 g tablet TAKE 1 TABLET BY MOUTH 4 TIMES DAILY WITH MEALS AND AT BEDTIME   traMADol (ULTRAM) 50 MG tablet Take 1 tablet (50 mg total) by mouth every 6 (six) hours as needed.   traZODone (DESYREL) 100 MG tablet TAKE 1 TABLET BY MOUTH AT BEDTIME AS NEEDED FOR SLEEP   vitamin A 10000 UNIT capsule Take 10,000 Units by mouth daily.   vitamin B-12 (CYANOCOBALAMIN) 1000 MCG tablet Take 1,000 mcg by mouth daily.   Vitamin E 400 units TABS Take 400 Units by mouth daily.    No facility-administered encounter medications on file as  of 03/02/2022.   Contacted [facility] for General Review Call  Unsuccessful attempts to complete assessment call. I have called patient 3x and left 3 voicemail's for the patient to return my call when available.   Care Gaps: HEMOGLOBIN A1C:Due since 02/20/2022  Star Rating Drugs: Atorvastatin 80 mg Last filled:02/25/22 90 DS Benazepril 10 mg Last filled:11/03/21 90 DS Farxiga 10 mg Last filled:None noted Trulicity 3 mg Last NTZGYF:74/94/49 28 DS Basaglar 100 unit Last filled:None noted Lyumjev 100 unit Last filled:None noted Humalog 200 unit Last filled:None noted  Corrie Mckusick, [place]

## 2022-03-03 ENCOUNTER — Other Ambulatory Visit: Payer: Self-pay | Admitting: Family Medicine

## 2022-03-03 ENCOUNTER — Other Ambulatory Visit: Payer: Self-pay | Admitting: Nurse Practitioner

## 2022-03-03 DIAGNOSIS — G894 Chronic pain syndrome: Secondary | ICD-10-CM | POA: Diagnosis not present

## 2022-03-03 DIAGNOSIS — M545 Low back pain, unspecified: Secondary | ICD-10-CM | POA: Diagnosis not present

## 2022-03-03 DIAGNOSIS — M5136 Other intervertebral disc degeneration, lumbar region: Secondary | ICD-10-CM | POA: Diagnosis not present

## 2022-03-03 MED ORDER — PANTOPRAZOLE SODIUM 40 MG PO TBEC: 40.0000 mg | DELAYED_RELEASE_TABLET | Freq: Two times a day (BID) | ORAL | 0 refills | Status: DC

## 2022-03-03 NOTE — Telephone Encounter (Signed)
Medication Refill - Medication: pantoprazole (PROTONIX) 40 MG tablet ?Pt is picking up his other meds at noon tomorrow and asked if this can be sent  in time to get during that time as well / please advise ? ?Has the patient contacted their pharmacy? Yes.   ?(Agent: If no, request that the patient contact the pharmacy for the refill. If patient does not wish to contact the pharmacy document the reason why and proceed with request.) ?(Agent: If yes, when and what did the pharmacy advise?) since this was filled at another pharmacy pt needed to call pcp office  ? ?Preferred Pharmacy (with phone number or street name):  ?[place], [place] [address] AT [place] Phone:  [phone]  ?Fax:  [phone]  ?  ? ?Has the patient been seen for an appointment in the last year OR does the patient have an upcoming appointment? Yes.   ? ?Agent: Please be advised that RX refills may take up to 3 business days. We ask that you follow-up with your pharmacy. ?

## 2022-03-03 NOTE — Telephone Encounter (Signed)
Requested Prescriptions  ?Pending Prescriptions Disp Refills  ?? pantoprazole (PROTONIX) 40 MG tablet 180 tablet 2  ?  Sig: Take 1 tablet (40 mg total) by mouth 2 (two) times daily.  ?  ? Gastroenterology: Proton Pump Inhibitors Passed - 03/03/2022  3:15 PM  ?  ?  Passed - Valid encounter within last 12 months  ?  Recent Outpatient Visits   ?      ? 6 days ago Morbid obesity ([place])  ? [facility] [place], [place] T, NP  ? 2 months ago Generalized abdominal pain  ? [facility] [place], [place] T, NP  ? 2 months ago Generalized abdominal pain  ? [facility] [place], [place] T, NP  ? 2 months ago Allergic reaction to shellfish  ? [place] P, DO  ? 4 months ago Uncontrolled type 2 diabetes mellitus with hyperglycemia ([place])  ? [facility] [place], Henrine Screws T, NP  ?  ?  ?Future Appointments   ?        ? In 1 month Cannady, Barbaraann Faster, NP [employer]  ?  ? ?  ?  ?  ? ? ?

## 2022-03-04 NOTE — Telephone Encounter (Signed)
Requested medication (s) are due for refill today:   Yes ? ?Requested medication (s) are on the active medication list:   Yes ? ?Future visit scheduled:   Yes ? ? ?Last ordered: 12/31/2021 #60, 2 refills ? ?Returned because pt requesting a 90 day supply.    I noticed the "No Print" instead of "Normal" for it to be sent.   Needs to be changed to "Normal".    ? ?Requested Prescriptions  ?Pending Prescriptions Disp Refills  ? famotidine (PEPCID) 20 MG tablet [Pharmacy Med Name: FAMOTIDINE '20MG'$  TABLETS] 180 tablet   ?  Sig: TAKE 1 TABLET BY MOUTH TWICE DAILY  ?  ? Gastroenterology:  H2 Antagonists Passed - 03/03/2022  3:37 PM  ?  ?  Passed - Valid encounter within last 12 months  ?  Recent Outpatient Visits   ? ?      ? 1 week ago Morbid obesity ([place])  ? [facility] [place], [place] T, NP  ? 2 months ago Generalized abdominal pain  ? [facility] [place], [place] T, NP  ? 2 months ago Generalized abdominal pain  ? [facility] [place], [place] T, NP  ? 2 months ago Allergic reaction to shellfish  ? [place] P, DO  ? 4 months ago Uncontrolled type 2 diabetes mellitus with hyperglycemia ([place])  ? [facility] [place], Henrine Screws T, NP  ? ?  ?  ?Future Appointments   ? ?        ? In 1 month Cannady, Barbaraann Faster, NP [employer]  ? ?  ? ? ?  ?  ?  ? ?

## 2022-03-05 ENCOUNTER — Emergency Department: Payer: Medicare Other

## 2022-03-05 ENCOUNTER — Ambulatory Visit: Payer: Medicare Other | Admitting: Internal Medicine

## 2022-03-05 ENCOUNTER — Other Ambulatory Visit: Payer: Self-pay

## 2022-03-05 ENCOUNTER — Observation Stay
Admission: EM | Admit: 2022-03-05 | Discharge: 2022-03-06 | Disposition: A | Discharge status: Home / Self Care | Payer: Medicare Other | Attending: Internal Medicine | Admitting: Internal Medicine

## 2022-03-05 ENCOUNTER — Encounter: Payer: Self-pay | Admitting: Emergency Medicine

## 2022-03-05 DIAGNOSIS — K859 Acute pancreatitis without necrosis or infection, unspecified: Secondary | ICD-10-CM | POA: Diagnosis present

## 2022-03-05 DIAGNOSIS — Z96641 Presence of right artificial hip joint: Secondary | ICD-10-CM | POA: Diagnosis not present

## 2022-03-05 DIAGNOSIS — J45909 Unspecified asthma, uncomplicated: Secondary | ICD-10-CM | POA: Diagnosis not present

## 2022-03-05 DIAGNOSIS — Z7982 Long term (current) use of aspirin: Secondary | ICD-10-CM | POA: Diagnosis not present

## 2022-03-05 DIAGNOSIS — K5900 Constipation, unspecified: Secondary | ICD-10-CM

## 2022-03-05 DIAGNOSIS — Z8673 Personal history of transient ischemic attack (TIA), and cerebral infarction without residual deficits: Secondary | ICD-10-CM | POA: Diagnosis not present

## 2022-03-05 DIAGNOSIS — E119 Type 2 diabetes mellitus without complications: Secondary | ICD-10-CM | POA: Insufficient documentation

## 2022-03-05 DIAGNOSIS — R109 Unspecified abdominal pain: Secondary | ICD-10-CM

## 2022-03-05 DIAGNOSIS — Z87891 Personal history of nicotine dependence: Secondary | ICD-10-CM | POA: Insufficient documentation

## 2022-03-05 DIAGNOSIS — R1084 Generalized abdominal pain: Secondary | ICD-10-CM | POA: Diagnosis not present

## 2022-03-05 DIAGNOSIS — Z7984 Long term (current) use of oral hypoglycemic drugs: Secondary | ICD-10-CM | POA: Diagnosis not present

## 2022-03-05 DIAGNOSIS — K760 Fatty (change of) liver, not elsewhere classified: Secondary | ICD-10-CM | POA: Diagnosis not present

## 2022-03-05 DIAGNOSIS — Z96651 Presence of right artificial knee joint: Secondary | ICD-10-CM | POA: Diagnosis not present

## 2022-03-05 DIAGNOSIS — Z8505 Personal history of malignant neoplasm of liver: Secondary | ICD-10-CM | POA: Insufficient documentation

## 2022-03-05 DIAGNOSIS — R000 Tachycardia, unspecified: Secondary | ICD-10-CM | POA: Diagnosis not present

## 2022-03-05 DIAGNOSIS — Z79899 Other long term (current) drug therapy: Secondary | ICD-10-CM | POA: Diagnosis not present

## 2022-03-05 DIAGNOSIS — Z794 Long term (current) use of insulin: Secondary | ICD-10-CM | POA: Insufficient documentation

## 2022-03-05 DIAGNOSIS — I2510 Atherosclerotic heart disease of native coronary artery without angina pectoris: Secondary | ICD-10-CM | POA: Diagnosis not present

## 2022-03-05 DIAGNOSIS — K8689 Other specified diseases of pancreas: Secondary | ICD-10-CM | POA: Diagnosis not present

## 2022-03-05 DIAGNOSIS — I10 Essential (primary) hypertension: Secondary | ICD-10-CM | POA: Insufficient documentation

## 2022-03-05 DIAGNOSIS — G8929 Other chronic pain: Secondary | ICD-10-CM | POA: Insufficient documentation

## 2022-03-05 LAB — CBC WITH DIFFERENTIAL/PLATELET
Abs Immature Granulocytes: 0.09 10*3/uL — ABNORMAL HIGH (ref 0.00–0.07)
Basophils Absolute: 0.1 10*3/uL (ref 0.0–0.1)
Basophils Relative: 1 %
Eosinophils Absolute: 0.4 10*3/uL (ref 0.0–0.5)
Eosinophils Relative: 2 %
HCT: 40 % (ref 39.0–52.0)
Hemoglobin: 12.7 g/dL — ABNORMAL LOW (ref 13.0–17.0)
Immature Granulocytes: 1 %
Lymphocytes Relative: 15 %
Lymphs Abs: 2.2 10*3/uL (ref 0.7–4.0)
MCH: 27.1 pg (ref 26.0–34.0)
MCHC: 31.8 g/dL (ref 30.0–36.0)
MCV: 85.5 fL (ref 80.0–100.0)
Monocytes Absolute: 1.7 10*3/uL — ABNORMAL HIGH (ref 0.1–1.0)
Monocytes Relative: 12 %
Neutro Abs: 10.4 10*3/uL — ABNORMAL HIGH (ref 1.7–7.7)
Neutrophils Relative %: 69 %
Platelets: 219 10*3/uL (ref 150–400)
RBC: 4.68 MIL/uL (ref 4.22–5.81)
RDW: 15.4 % (ref 11.5–15.5)
WBC: 14.9 10*3/uL — ABNORMAL HIGH (ref 4.0–10.5)
nRBC: 0 % (ref 0.0–0.2)

## 2022-03-05 LAB — TROPONIN I (HIGH SENSITIVITY): Troponin I (High Sensitivity): 6 ng/L (ref <18)

## 2022-03-05 LAB — LIPASE, BLOOD: Lipase: 45 U/L (ref 11–51)

## 2022-03-05 LAB — COMPREHENSIVE METABOLIC PANEL
ALT: 26 U/L (ref 0–44)
AST: 28 U/L (ref 15–41)
Albumin: 3.9 g/dL (ref 3.5–5.0)
Alkaline Phosphatase: 70 U/L (ref 38–126)
Anion gap: 12 (ref 5–15)
BUN: 17 mg/dL (ref 8–23)
CO2: 25 mmol/L (ref 22–32)
Calcium: 9.5 mg/dL (ref 8.9–10.3)
Chloride: 97 mmol/L — ABNORMAL LOW (ref 98–111)
Creatinine, Ser: 1.09 mg/dL (ref 0.61–1.24)
GFR, Estimated: >60 mL/min (ref >60)
Glucose, Bld: 331 mg/dL — ABNORMAL HIGH (ref 70–99)
Potassium: 4.5 mmol/L (ref 3.5–5.1)
Sodium: 134 mmol/L — ABNORMAL LOW (ref 135–145)
Total Bilirubin: 1 mg/dL (ref 0.3–1.2)
Total Protein: 8.3 g/dL — ABNORMAL HIGH (ref 6.5–8.1)

## 2022-03-05 LAB — HEMOGLOBIN A1C
Hgb A1c MFr Bld: 9.8 % — ABNORMAL HIGH (ref 4.8–5.6)
Mean Plasma Glucose: 234.56 mg/dL

## 2022-03-05 LAB — GLUCOSE, CAPILLARY
Glucose-Capillary: 213 mg/dL — ABNORMAL HIGH (ref 70–99)
Glucose-Capillary: 288 mg/dL — ABNORMAL HIGH (ref 70–99)

## 2022-03-05 LAB — TRIGLYCERIDES: Triglycerides: 144 mg/dL (ref <150)

## 2022-03-05 LAB — CBG MONITORING, ED: Glucose-Capillary: 259 mg/dL — ABNORMAL HIGH (ref 70–99)

## 2022-03-05 MED ORDER — TRAMADOL HCL 50 MG PO TABS
50.0000 mg | ORAL_TABLET | Freq: Four times a day (QID) | ORAL | Status: DC | PRN
  Administered 2022-03-05: 50 mg via ORAL
  Filled 2022-03-05: qty 1

## 2022-03-05 MED ORDER — ONDANSETRON HCL 4 MG/2ML IJ SOLN
4.0000 mg | Freq: Once | INTRAMUSCULAR | Status: AC
  Administered 2022-03-05: 4 mg via INTRAVENOUS
  Filled 2022-03-05: qty 2

## 2022-03-05 MED ORDER — MOMETASONE FURO-FORMOTEROL FUM 200-5 MCG/ACT IN AERO: 2.0000 | INHALATION_SPRAY | Freq: Two times a day (BID) | RESPIRATORY_TRACT | Status: DC

## 2022-03-05 MED ORDER — ONDANSETRON HCL 4 MG PO TABS: 4.0000 mg | ORAL_TABLET | Freq: Four times a day (QID) | ORAL | Status: DC | PRN

## 2022-03-05 MED ORDER — ASCORBIC ACID 500 MG PO TABS
1000.0000 mg | ORAL_TABLET | Freq: Every day | ORAL | Status: DC
  Administered 2022-03-05 – 2022-03-06 (×2): 1000 mg via ORAL
  Filled 2022-03-05 (×2): qty 2

## 2022-03-05 MED ORDER — INSULIN ASPART 100 UNIT/ML IJ SOLN
0.0000 [IU] | Freq: Three times a day (TID) | INTRAMUSCULAR | Status: DC
  Administered 2022-03-05: 3 [IU] via SUBCUTANEOUS
  Administered 2022-03-05 – 2022-03-06 (×2): 5 [IU] via SUBCUTANEOUS
  Filled 2022-03-05 (×3): qty 1

## 2022-03-05 MED ORDER — OMEGA-3-ACID ETHYL ESTERS 1 G PO CAPS
1000.0000 mg | ORAL_CAPSULE | Freq: Every day | ORAL | Status: DC
  Administered 2022-03-06: 1000 mg via ORAL
  Filled 2022-03-05 (×2): qty 1

## 2022-03-05 MED ORDER — ALBUTEROL SULFATE (2.5 MG/3ML) 0.083% IN NEBU: 3.0000 mL | INHALATION_SOLUTION | Freq: Four times a day (QID) | RESPIRATORY_TRACT | Status: DC | PRN

## 2022-03-05 MED ORDER — INSULIN ASPART 100 UNIT/ML IJ SOLN
0.0000 [IU] | Freq: Every day | INTRAMUSCULAR | Status: DC
  Administered 2022-03-05: 3 [IU] via SUBCUTANEOUS
  Filled 2022-03-05: qty 1

## 2022-03-05 MED ORDER — VITAMIN A 3 MG (10000 UNIT) PO CAPS
10000.0000 [IU] | ORAL_CAPSULE | Freq: Every day | ORAL | Status: DC
  Administered 2022-03-06: 10000 [IU] via ORAL
  Filled 2022-03-05 (×2): qty 1

## 2022-03-05 MED ORDER — MORPHINE SULFATE (PF) 2 MG/ML IV SOLN
1.0000 mg | Freq: Four times a day (QID) | INTRAVENOUS | Status: DC | PRN
  Administered 2022-03-05 (×2): 1 mg via INTRAVENOUS
  Filled 2022-03-05 (×2): qty 1

## 2022-03-05 MED ORDER — HYDROMORPHONE HCL 1 MG/ML IJ SOLN
0.5000 mg | Freq: Once | INTRAMUSCULAR | Status: AC
  Administered 2022-03-05: 0.5 mg via INTRAVENOUS
  Filled 2022-03-05: qty 0.5

## 2022-03-05 MED ORDER — UMECLIDINIUM BROMIDE 62.5 MCG/ACT IN AEPB
1.0000 | INHALATION_SPRAY | Freq: Every day | RESPIRATORY_TRACT | Status: DC
  Filled 2022-03-05: qty 7

## 2022-03-05 MED ORDER — DAPAGLIFLOZIN PROPANEDIOL 10 MG PO TABS
10.0000 mg | ORAL_TABLET | Freq: Every day | ORAL | Status: DC
  Administered 2022-03-06: 10 mg via ORAL
  Filled 2022-03-05 (×2): qty 1

## 2022-03-05 MED ORDER — VITAMIN B-12 1000 MCG PO TABS
1000.0000 ug | ORAL_TABLET | Freq: Every day | ORAL | Status: DC
  Administered 2022-03-06: 1000 ug via ORAL
  Filled 2022-03-05 (×2): qty 1

## 2022-03-05 MED ORDER — OXYCODONE HCL 5 MG PO TABS
10.0000 mg | ORAL_TABLET | ORAL | Status: DC | PRN
  Administered 2022-03-05: 10 mg via ORAL
  Administered 2022-03-05 (×3): 15 mg via ORAL
  Administered 2022-03-06: 10 mg via ORAL
  Filled 2022-03-05 (×2): qty 3
  Filled 2022-03-05 (×2): qty 2
  Filled 2022-03-05: qty 3

## 2022-03-05 MED ORDER — DOXEPIN HCL 50 MG PO CAPS
50.0000 mg | ORAL_CAPSULE | Freq: Every day | ORAL | Status: DC
  Administered 2022-03-05: 50 mg via ORAL
  Filled 2022-03-05: qty 1

## 2022-03-05 MED ORDER — SENNOSIDES-DOCUSATE SODIUM 8.6-50 MG PO TABS
1.0000 | ORAL_TABLET | Freq: Two times a day (BID) | ORAL | Status: DC
  Administered 2022-03-05 – 2022-03-06 (×3): 1 via ORAL
  Filled 2022-03-05 (×3): qty 1

## 2022-03-05 MED ORDER — VITAMIN E 180 MG (400 UNIT) PO CAPS
400.0000 [IU] | ORAL_CAPSULE | Freq: Every day | ORAL | Status: DC
  Administered 2022-03-06: 400 [IU] via ORAL
  Filled 2022-03-05 (×2): qty 1

## 2022-03-05 MED ORDER — CYCLOBENZAPRINE HCL 10 MG PO TABS
10.0000 mg | ORAL_TABLET | Freq: Three times a day (TID) | ORAL | Status: DC | PRN
  Administered 2022-03-05: 10 mg via ORAL
  Filled 2022-03-05: qty 1

## 2022-03-05 MED ORDER — ADULT MULTIVITAMIN W/MINERALS CH
1.0000 | ORAL_TABLET | Freq: Every day | ORAL | Status: DC
  Administered 2022-03-05 – 2022-03-06 (×2): 1 via ORAL
  Filled 2022-03-05 (×2): qty 1

## 2022-03-05 MED ORDER — MORPHINE SULFATE (PF) 4 MG/ML IV SOLN
4.0000 mg | Freq: Once | INTRAVENOUS | Status: AC
  Administered 2022-03-05: 4 mg via INTRAVENOUS
  Filled 2022-03-05: qty 1

## 2022-03-05 MED ORDER — INSULIN GLARGINE-YFGN 100 UNIT/ML ~~LOC~~ SOLN
32.0000 [IU] | Freq: Every day | SUBCUTANEOUS | Status: DC
  Filled 2022-03-05: qty 0.32

## 2022-03-05 MED ORDER — CALCIUM CARBONATE 1250 (500 CA) MG PO TABS
1250.0000 mg | ORAL_TABLET | Freq: Every day | ORAL | Status: DC
  Administered 2022-03-05 – 2022-03-06 (×2): 1250 mg via ORAL
  Filled 2022-03-05 (×2): qty 1

## 2022-03-05 MED ORDER — DULOXETINE HCL 30 MG PO CPEP
60.0000 mg | ORAL_CAPSULE | Freq: Every day | ORAL | Status: DC
  Administered 2022-03-05: 60 mg via ORAL
  Filled 2022-03-05: qty 2

## 2022-03-05 MED ORDER — DICLOFENAC SODIUM 1 % EX GEL: 2.0000 g | Freq: Four times a day (QID) | CUTANEOUS | Status: DC | PRN

## 2022-03-05 MED ORDER — BUDESON-GLYCOPYRROL-FORMOTEROL 160-9-4.8 MCG/ACT IN AERO: 2.0000 | INHALATION_SPRAY | Freq: Two times a day (BID) | RESPIRATORY_TRACT | Status: DC

## 2022-03-05 MED ORDER — FAMOTIDINE 20 MG PO TABS
20.0000 mg | ORAL_TABLET | Freq: Two times a day (BID) | ORAL | Status: DC
  Administered 2022-03-05 – 2022-03-06 (×3): 20 mg via ORAL
  Filled 2022-03-05 (×3): qty 1

## 2022-03-05 MED ORDER — ASPIRIN 81 MG PO TBEC
81.0000 mg | DELAYED_RELEASE_TABLET | Freq: Every day | ORAL | Status: DC
  Administered 2022-03-05 – 2022-03-06 (×2): 81 mg via ORAL
  Filled 2022-03-05 (×2): qty 1

## 2022-03-05 MED ORDER — ACETAMINOPHEN 650 MG RE SUPP: 650.0000 mg | Freq: Four times a day (QID) | RECTAL | Status: DC | PRN

## 2022-03-05 MED ORDER — PREGABALIN 50 MG PO CAPS
100.0000 mg | ORAL_CAPSULE | Freq: Two times a day (BID) | ORAL | Status: DC
  Administered 2022-03-05 – 2022-03-06 (×3): 100 mg via ORAL
  Filled 2022-03-05 (×3): qty 2

## 2022-03-05 MED ORDER — ISOSORBIDE MONONITRATE ER 30 MG PO TB24
60.0000 mg | ORAL_TABLET | Freq: Every day | ORAL | Status: DC
  Administered 2022-03-05 – 2022-03-06 (×2): 60 mg via ORAL
  Filled 2022-03-05: qty 1
  Filled 2022-03-05: qty 2

## 2022-03-05 MED ORDER — POLYETHYLENE GLYCOL 3350 17 G PO PACK
17.0000 g | PACK | Freq: Two times a day (BID) | ORAL | Status: DC
  Administered 2022-03-05 (×2): 17 g via ORAL
  Filled 2022-03-05 (×3): qty 1

## 2022-03-05 MED ORDER — ENOXAPARIN SODIUM 60 MG/0.6ML IJ SOSY
0.5000 mg/kg | PREFILLED_SYRINGE | INTRAMUSCULAR | Status: DC
  Administered 2022-03-05: 57.5 mg via SUBCUTANEOUS
  Filled 2022-03-05: qty 0.6

## 2022-03-05 MED ORDER — NITROGLYCERIN 0.4 MG SL SUBL: 0.4000 mg | SUBLINGUAL_TABLET | SUBLINGUAL | Status: DC | PRN

## 2022-03-05 MED ORDER — ONDANSETRON HCL 4 MG/2ML IJ SOLN: 4.0000 mg | Freq: Four times a day (QID) | INTRAMUSCULAR | Status: DC | PRN

## 2022-03-05 MED ORDER — PANTOPRAZOLE SODIUM 40 MG PO TBEC
40.0000 mg | DELAYED_RELEASE_TABLET | Freq: Two times a day (BID) | ORAL | Status: DC
  Administered 2022-03-05 – 2022-03-06 (×3): 40 mg via ORAL
  Filled 2022-03-05 (×3): qty 1

## 2022-03-05 MED ORDER — PLECANATIDE 3 MG PO TABS: 1.0000 | ORAL_TABLET | Freq: Every day | ORAL | Status: DC

## 2022-03-05 MED ORDER — IOHEXOL 300 MG/ML  SOLN
100.0000 mL | Freq: Once | INTRAMUSCULAR | Status: AC | PRN
  Administered 2022-03-05: 100 mL via INTRAVENOUS

## 2022-03-05 MED ORDER — ATORVASTATIN CALCIUM 20 MG PO TABS
80.0000 mg | ORAL_TABLET | Freq: Every day | ORAL | Status: DC
  Administered 2022-03-05: 80 mg via ORAL
  Filled 2022-03-05: qty 4

## 2022-03-05 MED ORDER — LACTATED RINGERS IV BOLUS
1000.0000 mL | Freq: Once | INTRAVENOUS | Status: AC
  Administered 2022-03-05: 1000 mL via INTRAVENOUS

## 2022-03-05 MED ORDER — VITAMIN D 25 MCG (1000 UNIT) PO TABS
1000.0000 [IU] | ORAL_TABLET | Freq: Every day | ORAL | Status: DC
  Administered 2022-03-05 – 2022-03-06 (×2): 1000 [IU] via ORAL
  Filled 2022-03-05 (×2): qty 1

## 2022-03-05 MED ORDER — ACETAMINOPHEN 325 MG PO TABS: 650.0000 mg | ORAL_TABLET | Freq: Four times a day (QID) | ORAL | Status: DC | PRN

## 2022-03-05 MED ORDER — FUROSEMIDE 20 MG PO TABS
20.0000 mg | ORAL_TABLET | Freq: Two times a day (BID) | ORAL | Status: DC
  Administered 2022-03-05 – 2022-03-06 (×3): 20 mg via ORAL
  Filled 2022-03-05 (×3): qty 1

## 2022-03-05 MED ORDER — SUCRALFATE 1 G PO TABS
1.0000 g | ORAL_TABLET | Freq: Three times a day (TID) | ORAL | Status: DC
  Administered 2022-03-05 – 2022-03-06 (×4): 1 g via ORAL
  Filled 2022-03-05 (×4): qty 1

## 2022-03-05 MED ORDER — EZETIMIBE 10 MG PO TABS
10.0000 mg | ORAL_TABLET | Freq: Every day | ORAL | Status: DC
  Administered 2022-03-05 – 2022-03-06 (×2): 10 mg via ORAL
  Filled 2022-03-05 (×2): qty 1

## 2022-03-05 MED ORDER — SODIUM CHLORIDE 0.9 % IV SOLN: Freq: Once | INTRAVENOUS | Status: AC

## 2022-03-05 MED ORDER — SENNOSIDES-DOCUSATE SODIUM 8.6-50 MG PO TABS: 1.0000 | ORAL_TABLET | Freq: Every evening | ORAL | Status: DC | PRN

## 2022-03-05 MED ORDER — INSULIN GLARGINE-YFGN 100 UNIT/ML ~~LOC~~ SOLN
32.0000 [IU] | Freq: Every day | SUBCUTANEOUS | Status: DC
  Administered 2022-03-05: 32 [IU] via SUBCUTANEOUS
  Filled 2022-03-05: qty 0.32

## 2022-03-05 MED ORDER — TRAZODONE HCL 100 MG PO TABS: 100.0000 mg | ORAL_TABLET | Freq: Every evening | ORAL | Status: DC | PRN

## 2022-03-05 NOTE — H&P (Signed)
History and Physical    Patient: Edward Schneider. QBH:[id number] DOB: Aug 31, 1956 DOA: 03/05/2022 DOS: the patient was seen and examined on 03/05/2022 PCP: [name], NP  Patient coming from: Home  Chief Complaint:  Chief Complaint  Patient presents with   Abdominal Pain   HPI: Edward Schneider. is a 66 y.o. male with medical history significant of aortic atherosclerosis, C diff diarrhea, collagenase colitis, CAD, liver cancer, chronic abdominal pain, hyperlipidemia hypertension and type II diabetes comes emergency room with three day of abdominal pain generalized [name] in the epigastric area without nausea and vomiting. Patient reports he has chronic pain but has been much severe. He was moving some furniture three days ago and thereafter started having pain.  In the ER CT scan shows. Inflammatory changes surrounding the pancreas concerning for an acute pancreatitis. No evidence of pancreatic necrosis and no pancreatic pseudocyst noted at this time. 2. Severe hepatic steatosis. There is a suggestion of a hypovascular lesion in the left lobe of the liver between segments 4A and 4B, but this is indeterminate on today's examination. Additionally, there is a mildly enlarged hepatoduodenal ligament lymph node, which is nonspecific. For further evaluation of both of these findings, follow-up nonemergent abdominal MRI with and without IV gadolinium is recommended in the near future to provide definitive characterization. 3. Aortic atherosclerosis.  Lipases 45. Patient continues to have abdominal pain no vomiting. No family at bedside. He is being admitted for mild acute pancreatitis. His pain is out of proportion to the CT findings and labs. Patient follows with G.I. Dr. Allen Norris as outpatient.  Review of Systems: As mentioned in the history of present illness. All other systems reviewed and are negative. Past Medical History:  Diagnosis Date   Allergy    Aortic atherosclerosis (HCC)     Asthma    C. difficile diarrhea    Chronic pain    Collagenous colitis    Coronary artery disease    a.) PCI with 2.75 x 18 mm Resolute Onyx DES x 1 to prox/mid LAD on 09/05/2019   DDD (degenerative disc disease), cervical    DDD (degenerative disc disease), lumbar    GERD (gastroesophageal reflux disease)    Grade I diastolic dysfunction    Hepatic steatosis    Hyperlipidemia    Hypertension    Liver cancer ([place]) 03/2015   Migraines    Myocardial infarction (HCC)     OSA on CPAP    Seizures ([place])    several as child when sick.  None since age 46   Stroke [facility])    'mini-stroke" 30 yrs ago. no deficits.   T2DM (type 2 diabetes mellitus) ([place])    Wears dentures    full upper and lower   Past Surgical History:  Procedure Laterality Date   APPENDECTOMY     BACK SURGERY     CARDIAC CATHETERIZATION     No stent placed in his "16's"   CERVICAL FUSION     COLONOSCOPY WITH PROPOFOL N/A 03/06/2016   Procedure: COLONOSCOPY WITH PROPOFOL;  Surgeon: Lucilla Lame, MD;  Location: [facility];  Service: Endoscopy;  Laterality: N/A;  requests early   COLONOSCOPY WITH PROPOFOL N/A 06/18/2020   Procedure: COLONOSCOPY WITH PROPOFOL;  Surgeon: Lucilla Lame, MD;  Location: [facility] ENDOSCOPY;  Service: Endoscopy;  Laterality: N/A;   ESOPHAGOGASTRODUODENOSCOPY (EGD) WITH PROPOFOL N/A 09/20/2017   Procedure: ESOPHAGOGASTRODUODENOSCOPY (EGD) WITH PROPOFOL;  Surgeon: Lucilla Lame, MD;  Location: [facility];  Service: Endoscopy;  Laterality: N/A;  Diabetic - oral meds   FINGER SURGERY Left    INTRAVASCULAR PRESSURE WIRE/FFR STUDY N/A 09/05/2019   Procedure: INTRAVASCULAR PRESSURE WIRE/FFR STUDY;  Surgeon: Nelva Bush, MD;  Location: [facility];  Service: Cardiovascular;  Laterality: N/A;   KNEE SURGERY Right    LEFT HEART CATH AND CORONARY ANGIOGRAPHY Left 09/05/2019   Procedure: LEFT HEART CATH AND CORONARY ANGIOGRAPHY (2.75 x 18 mm Resolute Onyx DES x 1 to prox/mid  LAD);  Surgeon: Nelva Bush, MD;  Location: [facility];  Service: Cardiovascular;  Laterality: Left;   NECK SURGERY     spleen surgery     TOE SURGERY Right    TOTAL HIP ARTHROPLASTY Right 06/03/2021   Procedure: TOTAL HIP ARTHROPLASTY ANTERIOR APPROACH;  Surgeon: Hessie Knows, MD;  Location: [facility];  Service: Orthopedics;  Laterality: Right;   TOTAL KNEE ARTHROPLASTY Right 08/22/2021   Procedure: TOTAL KNEE ARTHROPLASTY;  Surgeon: Hessie Knows, MD;  Location: [facility] ORS;  Service: Orthopedics;  Laterality: Right;   Social History:  reports that he quit smoking about 12 years ago. His smoking use included cigarettes. He has a 100.00 pack-year smoking history. He has never used smokeless tobacco. He reports that he does not drink alcohol and does not use drugs.  Allergies  Allergen Reactions   Bee Venom Anaphylaxis   Crestor [Rosuvastatin Calcium] Shortness Of Breath and Swelling   Fentanyl Itching and Hives    blisters Patch   Gabapentin Diarrhea    Severe diarrhea which caused incontinence, loss of appetite and weight loss.   Shellfish Allergy Anaphylaxis and Swelling    Shrimp causes throat to swell and tingling in tongue.    [employer]    Furosemide Nausea And Vomiting   Buprenorphine Hcl Itching   Chlorhexidine Gluconate Itching and Rash   Simvastatin Diarrhea    Family History  Problem Relation Age of Onset   Arthritis Mother    Diabetes Mother    Kidney disease Mother    Heart disease Mother    Hypertension Mother    Arthritis Father    Hearing loss Father    Hypertension Father    Heart disease Father    Diabetes Sister    Heart disease Sister    Diabetes Daughter    Diabetes Maternal Aunt    Diabetes Maternal Grandmother    Heart Problems Brother    Heart Problems Brother    Heart Problems Brother    Heart attack Maternal Grandfather    Colon cancer Paternal Grandfather     Prior to Admission medications   Medication Sig Start Date End Date  Taking? Authorizing Provider  acetaminophen (TYLENOL) 650 MG CR tablet Take 1,300 mg by mouth every 8 (eight) hours.   Yes [provider]  albuterol (VENTOLIN HFA) 108 (90 Base) MCG/ACT inhaler Inhale 2 puffs into the lungs every 6 (six) hours as needed for wheezing or shortness of breath. 12/24/20  Yes Olalere, Adewale A, MD  Ascorbic Acid (VITAMIN C) 1000 MG tablet Take 1,000 mg by mouth daily.    Yes [provider]  aspirin EC 81 MG tablet Take 81 mg by mouth daily. Swallow whole.   Yes [provider]  atorvastatin (LIPITOR) 80 MG tablet TAKE 1 TABLET BY MOUTH AT BEDTIME 02/24/22  Yes End, Harrell Gave, MD  benazepril (LOTENSIN) 10 MG tablet Take 1 tablet by mouth once daily 01/27/22  Yes End, Harrell Gave, MD  Budeson-Glycopyrrol-Formoterol (BREZTRI AEROSPHERE) 160-9-4.8 MCG/ACT AERO Inhale 2  puffs into the lungs in the morning and at bedtime. 02/23/22  Yes Martyn Ehrich, NP  calcium carbonate (OS-CAL) 600 MG TABS tablet Take 600 mg by mouth daily.    Yes [provider]  cetirizine (ZYRTEC) 10 MG tablet TAKE 1 TABLET BY MOUTH EVERY DAY 02/23/22  Yes Johnson, Megan P, DO  Cholecalciferol (VITAMIN D3) 25 MCG (1000 UT) CHEW Chew 1,000 Units by mouth daily.    Yes [provider]  CINNAMON PO Take 1,000 mg by mouth 2 (two) times daily.    Yes [provider]  clobetasol cream (TEMOVATE) 5.91 % Apply 1 application topically 2 (two) times daily. 07/17/20  Yes Kathrine Haddock, NP  cyclobenzaprine (FLEXERIL) 10 MG tablet Take 1 tablet (10 mg total) by mouth 3 (three) times daily as needed for muscle spasms. 01/07/18  Yes Lucilla Lame, MD  dapagliflozin propanediol (FARXIGA) 10 MG TABS tablet Take 1 tablet (10 mg total) by mouth daily before breakfast. 09/04/21  Yes Shamleffer, Melanie Crazier, MD  diclofenac Sodium (VOLTAREN) 1 % GEL Apply 2 g topically 4 (four) times daily as needed (pain).  07/10/20  Yes [provider]  diphenhydrAMINE  (BENADRYL) 25 mg capsule Take 50 mg by mouth in the morning, at noon, and at bedtime.   Yes [provider]  doxepin (SINEQUAN) 25 MG capsule Take 50 mg by mouth at bedtime.  07/31/19  Yes [provider]  Dulaglutide (TRULICITY) 3 MB/8.4YK SOPN Inject 3 mg as directed once a week. 09/04/21  Yes Shamleffer, Melanie Crazier, MD  DULoxetine (CYMBALTA) 60 MG capsule Take 1 capsule (60 mg total) by mouth at bedtime. 10/06/21  Yes Cannady, Henrine Screws T, NP  ezetimibe (ZETIA) 10 MG tablet Take 1 tablet by mouth once daily 11/24/21  Yes End, Harrell Gave, MD  famotidine (PEPCID) 20 MG tablet TAKE 1 TABLET BY MOUTH TWICE DAILY 03/04/22  Yes Cannady, Jolene T, NP  Ferrous Gluconate-C-Folic Acid (IRON-C PO) Take 30 mg by mouth daily.   Yes [provider]  furosemide (LASIX) 20 MG tablet Take 1 tablet (20 mg total) by mouth 2 (two) times daily. 12/19/21  Yes Martyn Ehrich, NP  Ginger, Zingiber officinalis, (GINGER PO) Take 1 tablet by mouth daily.   Yes [provider]  Ginseng 100 MG CAPS Take 100 mg by mouth daily.    Yes [provider]  Insulin Glargine (BASAGLAR KWIKPEN) 100 UNIT/ML Inject 32 Units into the skin daily. 02/16/22  Yes Shamleffer, Melanie Crazier, MD  Insulin Lispro-aabc (LYUMJEV KWIKPEN) 100 UNIT/ML KwikPen 22 units with Breakfast and continue 16 units with Lunch and 16 units with supper 01/27/22  Yes Shamleffer, Melanie Crazier, MD  isosorbide mononitrate (IMDUR) 60 MG 24 hr tablet Take 1 tablet by mouth once daily 01/10/22  Yes Cannady, Jolene T, NP  mometasone (ELOCON) 0.1 % ointment Apply topically 2 (two) times daily as needed. 12/23/21  Yes [provider]  Multiple Vitamins-Minerals (CENTRUM SILVER 50+MEN) TABS Take 1 tablet by mouth daily.   Yes [provider]  naproxen sodium (ALEVE) 220 MG tablet Take 440 mg by mouth daily as needed (body aches).   Yes [provider]  Omega-3 1000 MG CAPS Take 1,000 mg by mouth daily.     Yes [provider]  oxyCODONE 10 MG TABS Take 1-1.5 tablets (10-15 mg total) by mouth every 3 (three) hours as needed for severe pain (pain score 7-10). 08/24/21  Yes Duanne Guess, PA-C  pantoprazole (PROTONIX) 40 MG tablet  Take 1 tablet (40 mg total) by mouth 2 (two) times daily. 03/03/22  Yes Cannady, Jolene T, NP  Plecanatide (TRULANCE) 3 MG TABS Take 1 tablet by mouth daily. 02/16/22  Yes Lucilla Lame, MD  pregabalin (LYRICA) 100 MG capsule Take 1 capsule (100 mg total) by mouth 2 (two) times daily. 02/17/22  Yes Cameron Sprang, MD  sucralfate (CARAFATE) 1 g tablet TAKE 1 TABLET BY MOUTH 4 TIMES DAILY WITH MEALS AND AT BEDTIME 10/27/21  Yes Cannady, Jolene T, NP  traMADol (ULTRAM) 50 MG tablet Take 1 tablet (50 mg total) by mouth every 6 (six) hours as needed. 08/24/21  Yes Duanne Guess, PA-C  traZODone (DESYREL) 100 MG tablet TAKE 1 TABLET BY MOUTH AT BEDTIME AS NEEDED FOR SLEEP 01/10/22  Yes Cannady, Jolene T, NP  vitamin A 10000 UNIT capsule Take 10,000 Units by mouth daily.   Yes [provider]  vitamin B-12 (CYANOCOBALAMIN) 1000 MCG tablet Take 1,000 mcg by mouth daily.   Yes [provider]  Vitamin E 400 units TABS Take 400 Units by mouth daily.    Yes [provider]  Alcohol Swabs (PHARMACIST CHOICE ALCOHOL) PADS SMARTSIG:1 Each Topical 4 Times Daily 09/18/21   [provider]  COMFORT EZ PEN NEEDLES 32G X 4 MM [place]  09/18/21   [provider]  Continuous Blood Gluc Sensor (FREESTYLE LIBRE 2 SENSOR) MISC Use as instructed to check blood sugar daily 01/16/21   Shamleffer, Melanie Crazier, MD  EPINEPHrine 0.3 mg/0.3 mL IJ SOAJ injection Inject 0.3 mg into the muscle as needed for anaphylaxis. 12/12/21   Johnson, Megan P, DO  lidocaine (XYLOCAINE) 2 % solution Use as directed 15 mLs in the mouth or throat as needed for mouth pain. 12/12/21   Johnson, Megan P, DO  NARCAN 4 MG/0.1ML LIQD nasal spray kit Place 0.4 mg into the nose as needed  (opioid overdose).  02/20/19   [provider]  nitroGLYCERIN (NITROSTAT) 0.4 MG SL tablet Place 0.4 mg under the tongue every 5 (five) minutes as needed for chest pain.  08/30/19   [provider]  PRESCRIPTION MEDICATION CPAP    [provider]  senna-docusate (SENOKOT-S) 8.6-50 MG tablet Take 1 tablet by mouth at bedtime as needed for mild constipation. Patient not taking: Reported on 03/05/2022 06/04/21   Duanne Guess, [place]    Physical Exam: Vitals:   03/05/22 0534 03/05/22 0611 03/05/22 0630 03/05/22 1139  BP: (!) 150/101 134/80 (!) 127/91 130/77  Pulse: (!) 46 (!) 103 (!) 105 94  Resp: (!) _0 Temp: 98.4 F (36.9 C)   98.3 F (36.8 C)  TempSrc: Oral   Oral  SpO2: 96% 95% 92% 93%  Weight: 113.4 kg     Height: 5' 8" (1.727 m)      alert oriented times three morbidly obese. Respiratory clear to auscultation bilateral no Rolitta rhonchi. Abdomen soft abdominal obesity with tenderness diffuse more in the epigastric area pain present on palpation. Cardiovascular both heart sounds normal rate rhythm regular neuro- patient is alert and oriented times three    Assessment and Plan:  Jay Haskew. is a 66 y.o. male with medical history significant of aortic atherosclerosis, C diff diarrhea, collagenase colitis, CAD, liver cancer, chronic abdominal pain, hyperlipidemia hypertension and type II diabetes comes emergency room with three day of abdominal pain generalized [name] in the epigastric area without nausea and vomiting. Patient reports he has chronic pain but has  been much severe. He was moving some furniture three days ago and thereafter started having pain.  Acute on chronic abdominal pain mild pancreatitis with normal lipase -- clear liquid diet -- IV fluids -- IV and oral pain meds PRN -- G.I. consultation with Dr. Allen Norris -- hold benazepril-- ACE inhibitor can contribute to pancreatitis -- ultrasound abdomen to look for gallbladder --  patient denies any history of alcohol -- check triglycerides  [name] continue PPI  hypertension will resume home meds  type II diabetes with atherosclerosis and hyperlipidemia -- insulin long-acting with sliding scale -- statins  history of hepatic steatosis and liver cancer -- continue to monitor   Advance Care Planning:   Code Status: Full Code   Consults: G.I.  Family Communication: none  Severity of Illness: The appropriate patient status for this patient is OBSERVATION. Observation status is judged to be reasonable and necessary in order to provide the required intensity of service to ensure the patient's safety. The patient's presenting symptoms, physical exam findings, and initial radiographic and laboratory data in the context of their medical condition is felt to place them at decreased risk for further clinical deterioration. Furthermore, it is anticipated that the patient will be medically stable for discharge from the hospital within 2 midnights of admission.   Author: Fritzi Mandes, MD 03/05/2022 1:43 PM  For on call review [email or web address].

## 2022-03-05 NOTE — Consult Note (Signed)
Lucilla Lame, MD [facility]  [address], [place], [place] [address] Phone: [phone] Fax : [phone]  Consultation  Referring Provider:     Dr. Posey Pronto Primary Care Physician:  [name], NP Primary Gastroenterologist:  Dr. Allen Norris         Reason for Consultation:     Abdominal pain  Date of Admission:  03/05/2022 Date of Consultation:  03/05/2022         HPI:   Edward Schneider. is a 66 y.o. male Who is admitted with abdominal pain that he states continues to get worse.  The patient has had multiple CT scans over the last 9 years including  having a CT scan this year.  The patient was seen in my office and clearly had musculoskeletal pain earlier this month.  The patient was now admitted with abdominal pain in the same area over his left rectus abdominis muscles and he reports that he goes to his lower back.  The patient had a CT scan that showed inflammatory changes around the pancreas concerning for acute pancreatitis and fatty liver.  The patient also had a normal lipase level.  He reports abdominal pain has been worse the last 3 days.  He also reports that he had been moving furniture 3 days ago right before the pain started  Past Medical History:  Diagnosis Date   Allergy    Aortic atherosclerosis (HCC)    Asthma    C. difficile diarrhea    Chronic pain    Collagenous colitis    Coronary artery disease    a.) PCI with 2.75 x 18 mm Resolute Onyx DES x 1 to prox/mid LAD on 09/05/2019   DDD (degenerative disc disease), cervical    DDD (degenerative disc disease), lumbar    GERD (gastroesophageal reflux disease)    Grade I diastolic dysfunction    Hepatic steatosis    Hyperlipidemia    Hypertension    Liver cancer ([place]) 03/2015   Migraines    Myocardial infarction (HCC)     OSA on CPAP    Seizures ([place])    several as child when sick.  None since age 32   Stroke [facility])    'mini-stroke" 30 yrs ago. no deficits.   T2DM (type 2 diabetes mellitus) ([place])     Wears dentures    full upper and lower    Past Surgical History:  Procedure Laterality Date   APPENDECTOMY     BACK SURGERY     CARDIAC CATHETERIZATION     No stent placed in his "74's"   CERVICAL FUSION     COLONOSCOPY WITH PROPOFOL N/A 03/06/2016   Procedure: COLONOSCOPY WITH PROPOFOL;  Surgeon: Lucilla Lame, MD;  Location: [place];  Service: Endoscopy;  Laterality: N/A;  requests early   COLONOSCOPY WITH PROPOFOL N/A 06/18/2020   Procedure: COLONOSCOPY WITH PROPOFOL;  Surgeon: Lucilla Lame, MD;  Location: [facility] ENDOSCOPY;  Service: Endoscopy;  Laterality: N/A;   ESOPHAGOGASTRODUODENOSCOPY (EGD) WITH PROPOFOL N/A 09/20/2017   Procedure: ESOPHAGOGASTRODUODENOSCOPY (EGD) WITH PROPOFOL;  Surgeon: Lucilla Lame, MD;  Location: [place];  Service: Endoscopy;  Laterality: N/A;  Diabetic - oral meds   FINGER SURGERY Left    INTRAVASCULAR PRESSURE WIRE/FFR STUDY N/A 09/05/2019   Procedure: INTRAVASCULAR PRESSURE WIRE/FFR STUDY;  Surgeon: Nelva Bush, MD;  Location: [place] CV [facility];  Service: Cardiovascular;  Laterality: N/A;   KNEE SURGERY Right    LEFT HEART CATH AND CORONARY ANGIOGRAPHY Left  09/05/2019   Procedure: LEFT HEART CATH AND CORONARY ANGIOGRAPHY (2.75 x 18 mm Resolute Onyx DES x 1 to prox/mid LAD);  Surgeon: Nelva Bush, MD;  Location: [place] [facility];  Service: Cardiovascular;  Laterality: Left;   NECK SURGERY     spleen surgery     TOE SURGERY Right    TOTAL HIP ARTHROPLASTY Right 06/03/2021   Procedure: TOTAL HIP ARTHROPLASTY ANTERIOR APPROACH;  Surgeon: Hessie Knows, MD;  Location: [facility];  Service: Orthopedics;  Laterality: Right;   TOTAL KNEE ARTHROPLASTY Right 08/22/2021   Procedure: TOTAL KNEE ARTHROPLASTY;  Surgeon: Hessie Knows, MD;  Location: [facility];  Service: Orthopedics;  Laterality: Right;    Prior to Admission medications   Medication Sig Start Date End Date Taking? Authorizing Provider  acetaminophen (TYLENOL) 650 MG  CR tablet Take 1,300 mg by mouth every 8 (eight) hours.   Yes [provider]  albuterol (VENTOLIN HFA) 108 (90 Base) MCG/ACT inhaler Inhale 2 puffs into the lungs every 6 (six) hours as needed for wheezing or shortness of breath. 12/24/20  Yes Olalere, Adewale A, MD  Ascorbic Acid (VITAMIN C) 1000 MG tablet Take 1,000 mg by mouth daily.    Yes [provider]  aspirin EC 81 MG tablet Take 81 mg by mouth daily. Swallow whole.   Yes [provider]  atorvastatin (LIPITOR) 80 MG tablet TAKE 1 TABLET BY MOUTH AT BEDTIME 02/24/22  Yes End, Harrell Gave, MD  benazepril (LOTENSIN) 10 MG tablet Take 1 tablet by mouth once daily 01/27/22  Yes End, Harrell Gave, MD  Budeson-Glycopyrrol-Formoterol (BREZTRI AEROSPHERE) 160-9-4.8 MCG/ACT AERO Inhale 2 puffs into the lungs in the morning and at bedtime. 02/23/22  Yes Martyn Ehrich, NP  calcium carbonate (OS-CAL) 600 MG TABS tablet Take 600 mg by mouth daily.    Yes [provider]  cetirizine (ZYRTEC) 10 MG tablet TAKE 1 TABLET BY MOUTH EVERY DAY 02/23/22  Yes Johnson, Megan P, DO  Cholecalciferol (VITAMIN D3) 25 MCG (1000 UT) CHEW Chew 1,000 Units by mouth daily.    Yes [provider]  CINNAMON PO Take 1,000 mg by mouth 2 (two) times daily.    Yes [provider]  clobetasol cream (TEMOVATE) 9.73 % Apply 1 application topically 2 (two) times daily. 07/17/20  Yes Kathrine Haddock, NP  cyclobenzaprine (FLEXERIL) 10 MG tablet Take 1 tablet (10 mg total) by mouth 3 (three) times daily as needed for muscle spasms. 01/07/18  Yes Lucilla Lame, MD  dapagliflozin propanediol (FARXIGA) 10 MG TABS tablet Take 1 tablet (10 mg total) by mouth daily before breakfast. 09/04/21  Yes Shamleffer, Melanie Crazier, MD  diclofenac Sodium (VOLTAREN) 1 % GEL Apply 2 g topically 4 (four) times daily as needed (pain).  07/10/20  Yes [provider]  diphenhydrAMINE (BENADRYL) 25 mg capsule Take 50 mg by mouth in the morning, at noon,  and at bedtime.   Yes [provider]  doxepin (SINEQUAN) 25 MG capsule Take 50 mg by mouth at bedtime.  07/31/19  Yes [provider]  Dulaglutide (TRULICITY) 3 ZH/2.9JM SOPN Inject 3 mg as directed once a week. 09/04/21  Yes Shamleffer, Melanie Crazier, MD  DULoxetine (CYMBALTA) 60 MG capsule Take 1 capsule (60 mg total) by mouth at bedtime. 10/06/21  Yes Cannady, Henrine Screws T, NP  ezetimibe (ZETIA) 10 MG tablet Take 1 tablet by mouth once daily 11/24/21  Yes End, Harrell Gave, MD  famotidine (PEPCID) 20 MG tablet TAKE 1 TABLET BY MOUTH TWICE DAILY 03/04/22  Yes Cannady, Henrine Screws T, NP  Ferrous Gluconate-C-Folic Acid (IRON-C PO) Take 30 mg by mouth daily.   Yes [provider]  furosemide (LASIX) 20 MG tablet Take 1 tablet (20 mg total) by mouth 2 (two) times daily. 12/19/21  Yes Martyn Ehrich, NP  Ginger, Zingiber officinalis, (GINGER PO) Take 1 tablet by mouth daily.   Yes [provider]  Ginseng 100 MG CAPS Take 100 mg by mouth daily.    Yes [provider]  Insulin Glargine (BASAGLAR KWIKPEN) 100 UNIT/ML Inject 32 Units into the skin daily. 02/16/22  Yes Shamleffer, Melanie Crazier, MD  Insulin Lispro-aabc (LYUMJEV KWIKPEN) 100 UNIT/ML KwikPen 22 units with Breakfast and continue 16 units with Lunch and 16 units with supper 01/27/22  Yes Shamleffer, Melanie Crazier, MD  isosorbide mononitrate (IMDUR) 60 MG 24 hr tablet Take 1 tablet by mouth once daily 01/10/22  Yes Cannady, Jolene T, NP  mometasone (ELOCON) 0.1 % ointment Apply topically 2 (two) times daily as needed. 12/23/21  Yes [provider]  Multiple Vitamins-Minerals (CENTRUM SILVER 50+MEN) TABS Take 1 tablet by mouth daily.   Yes [provider]  naproxen sodium (ALEVE) 220 MG tablet Take 440 mg by mouth daily as needed (body aches).   Yes [provider]  Omega-3 1000 MG CAPS Take 1,000 mg by mouth daily.    Yes [provider]  oxyCODONE 10 MG TABS Take 1-1.5  tablets (10-15 mg total) by mouth every 3 (three) hours as needed for severe pain (pain score 7-10). 08/24/21  Yes Duanne Guess, PA-C  pantoprazole (PROTONIX) 40 MG tablet Take 1 tablet (40 mg total) by mouth 2 (two) times daily. 03/03/22  Yes Cannady, Jolene T, NP  Plecanatide (TRULANCE) 3 MG TABS Take 1 tablet by mouth daily. 02/16/22  Yes Lucilla Lame, MD  pregabalin (LYRICA) 100 MG capsule Take 1 capsule (100 mg total) by mouth 2 (two) times daily. 02/17/22  Yes Cameron Sprang, MD  sucralfate (CARAFATE) 1 g tablet TAKE 1 TABLET BY MOUTH 4 TIMES DAILY WITH MEALS AND AT BEDTIME 10/27/21  Yes Cannady, Jolene T, NP  traMADol (ULTRAM) 50 MG tablet Take 1 tablet (50 mg total) by mouth every 6 (six) hours as needed. 08/24/21  Yes Duanne Guess, PA-C  traZODone (DESYREL) 100 MG tablet TAKE 1 TABLET BY MOUTH AT BEDTIME AS NEEDED FOR SLEEP 01/10/22  Yes Cannady, Jolene T, NP  vitamin A 10000 UNIT capsule Take 10,000 Units by mouth daily.   Yes [provider]  vitamin B-12 (CYANOCOBALAMIN) 1000 MCG tablet Take 1,000 mcg by mouth daily.   Yes [provider]  Vitamin E 400 units TABS Take 400 Units by mouth daily.    Yes [provider]  Alcohol Swabs (PHARMACIST CHOICE ALCOHOL) PADS SMARTSIG:1 Each Topical 4 Times Daily 09/18/21   [provider]  COMFORT EZ PEN NEEDLES 32G X 4 MM [place]  09/18/21   [provider]  Continuous Blood Gluc Sensor (FREESTYLE LIBRE 2 SENSOR) MISC Use as instructed to check blood sugar daily 01/16/21   Shamleffer, Melanie Crazier, MD  EPINEPHrine 0.3 mg/0.3 mL IJ SOAJ injection Inject 0.3 mg into the muscle as needed for anaphylaxis. 12/12/21   Johnson, Megan P, DO  lidocaine (XYLOCAINE) 2 % solution Use as directed 15 mLs in the mouth or throat as needed for mouth pain. 12/12/21   Johnson, Megan P, DO  NARCAN 4 MG/0.1ML LIQD nasal spray kit Place 0.4 mg into the nose  as needed (opioid overdose).  02/20/19   [provider]   nitroGLYCERIN (NITROSTAT) 0.4 MG SL tablet Place 0.4 mg under the tongue every 5 (five) minutes as needed for chest pain.  08/30/19   [provider]  PRESCRIPTION MEDICATION CPAP    [provider]  senna-docusate (SENOKOT-S) 8.6-50 MG tablet Take 1 tablet by mouth at bedtime as needed for mild constipation. Patient not taking: Reported on 03/05/2022 06/04/21   Duanne Guess, PA-C    Family History  Problem Relation Age of Onset   Arthritis Mother    Diabetes Mother    Kidney disease Mother    Heart disease Mother    Hypertension Mother    Arthritis Father    Hearing loss Father    Hypertension Father    Heart disease Father    Diabetes Sister    Heart disease Sister    Diabetes Daughter    Diabetes Maternal Aunt    Diabetes Maternal Grandmother    Heart Problems Brother    Heart Problems Brother    Heart Problems Brother    Heart attack Maternal Grandfather    Colon cancer Paternal Grandfather      Social History   Tobacco Use   Smoking status: Former    Packs/day: 2.00    Years: 50.00    Pack years: 100.00    Types: Cigarettes    Quit date: 2011    Years since quitting: 12.3   Smokeless tobacco: Never  Vaping Use   Vaping Use: Never used  Substance Use Topics   Alcohol use: No    Alcohol/week: 0.0 standard drinks   Drug use: No    Allergies as of 03/05/2022 - Review Complete 03/05/2022  Allergen Reaction Noted   Bee venom Anaphylaxis 02/22/2014   Crestor [rosuvastatin calcium] Shortness Of Breath and Swelling 03/03/2017   Fentanyl Itching and Hives 03/27/2015   Gabapentin Diarrhea 02/22/2014   Shellfish allergy Anaphylaxis and Swelling 03/20/2014   Fire ant  01/05/2022   Furosemide Nausea And Vomiting 01/01/2020   Buprenorphine hcl Itching 10/10/2015   Chlorhexidine gluconate Itching and Rash 08/13/2021   Simvastatin Diarrhea 04/25/2015    Review of Systems:    All systems reviewed and negative except where noted in HPI.    Physical Exam:  Vital signs in last 24 hours: Temp:  [97.9 F (36.6 C)-98.4 F (36.9 C)] 97.9 F (36.6 C) (05/18 1520) Pulse Rate:  [46-105] 99 (05/18 1520) Resp:  [14-21] 18 (05/18 1520) BP: (90-150)/(61-101) 90/61 (05/18 1520) SpO2:  [92 %-96 %] 94 % (05/18 1520) Weight:  [113.4 kg] 113.4 kg (05/18 0534) Last BM Date : 03/02/22 General:   Pleasant, cooperative in NAD Head:  Normocephalic and atraumatic. Eyes:   No icterus.   Conjunctiva pink. PERRLA. Ears:  Normal auditory acuity. Neck:  Supple; no masses or thyroidomegaly Lungs: Respirations even and unlabored. Lungs clear to auscultation bilaterally.   No wheezes, crackles, or rhonchi.  Heart:  Regular rate and rhythm;  Without murmur, clicks, rubs or gallops Abdomen:  Soft, Obese, Positive tenderness to one finger palpation with flexing the abdominal wall muscles. Normal bowel sounds. No appreciable masses or hepatomegaly.  No rebound or guarding.  Rectal:  Not performed. Msk:  Symmetrical without gross deformities.    Extremities:  Without edema, cyanosis or clubbing. Neurologic:  Alert and oriented x3;  grossly normal neurologically. Skin:  Intact without significant lesions or rashes. Cervical Nodes:  No significant cervical adenopathy. Psych:  Alert and  cooperative. Normal affect.  LAB RESULTS: Recent Labs    03/05/22 0603  WBC 14.9*  HGB 12.7*  HCT 40.0  PLT 219   BMET Recent Labs    03/05/22 0603  NA 134*  K 4.5  CL 97*  CO2 25  GLUCOSE 331*  BUN 17  CREATININE 1.09  CALCIUM 9.5   LFT Recent Labs    03/05/22 0603  PROT 8.3*  ALBUMIN 3.9  AST 28  ALT 26  ALKPHOS 70  BILITOT 1.0   PT/INR No results for input(s): LABPROT, INR in the last 72 hours.  STUDIES: CT ABDOMEN PELVIS W CONTRAST  Result Date: 03/05/2022 CLINICAL DATA:  66 year old male with history of 3 days of mid to lower back pain radiating into the mid to lower abdomen, with some associated nausea and vomiting. EXAM: CT ABDOMEN AND  PELVIS WITH CONTRAST TECHNIQUE: Multidetector CT imaging of the abdomen and pelvis was performed using the standard protocol following bolus administration of intravenous contrast. RADIATION DOSE REDUCTION: This exam was performed according to the departmental dose-optimization program which includes automated exposure control, adjustment of the mA and/or kV according to patient size and/or use of iterative reconstruction technique. CONTRAST:  138m OMNIPAQUE IOHEXOL 300 MG/ML  SOLN COMPARISON:  CT the abdomen and pelvis 12/25/2021. FINDINGS: Lower chest: Unremarkable. Hepatobiliary: Diffuse low attenuation throughout the hepatic parenchyma, indicative of a background of hepatic steatosis. There is a suggestion of an ill-defined hypovascular lesion between segments 4A and 4B (axial image 20 of series 2) measuring 1.6 cm in diameter. No intra or extrahepatic biliary ductal dilatation. Gallbladder is normal in appearance. Pancreas: Mild haziness in the peripancreatic fat, most evident adjacent to the head and uncinate process of the pancreas, suggesting acute pancreatitis. No pancreatic ductal dilatation. No pancreatic mass. No peripancreatic fluid collections. Spleen: Unremarkable. Adrenals/Urinary Tract: Bilateral kidneys and adrenal glands are normal in appearance. No hydroureteronephrosis. Urinary bladder is normal in appearance. Stomach/Bowel: The appearance of the stomach is normal. There is no pathologic dilatation of small bowel or colon. The appendix is not confidently identified and may be surgically absent. Regardless, there are no inflammatory changes noted adjacent to the cecum to suggest the presence of an acute appendicitis at this time. Vascular/Lymphatic: Aortic atherosclerosis, without evidence of aneurysm or dissection in the abdominal or pelvic vasculature. Mildly enlarged lymph node in the hepatoduodenal ligament (axial image 23 of series 2), nonspecific. No other lymphadenopathy noted elsewhere  in the abdomen or pelvis. Reproductive: Prostate gland and seminal vesicles are unremarkable in appearance. Other: No significant volume of ascites.  No pneumoperitoneum. Musculoskeletal: Rod and screw fixation hardware extending from L4-S1 with interbody cages at L4-L5 and L5-S1 interspaces. There are no aggressive appearing lytic or blastic lesions noted in the visualized portions of the skeleton. IMPRESSION: 1. Inflammatory changes surrounding the pancreas concerning for an acute pancreatitis. No evidence of pancreatic necrosis and no pancreatic pseudocyst noted at this time. 2. Severe hepatic steatosis. There is a suggestion of a hypovascular lesion in the left lobe of the liver between segments 4A and 4B, but this is indeterminate on today's examination. Additionally, there is a mildly enlarged hepatoduodenal ligament lymph node, which is nonspecific. For further evaluation of both of these findings, follow-up nonemergent abdominal MRI with and without IV gadolinium is recommended in the near future to provide definitive characterization. 3. Aortic atherosclerosis. Electronically Signed   By: DVinnie LangtonM.D.   On: 03/05/2022 07:21      Impression / Plan:   Assessment:  Principal Problem:   Pancreatitis   Edward Schneider. is a 66 y.o. y/o male with Who comes in with chronic abdominal pain with multiple CT scans over the last 9 years including this admission which makes 2 CT scans just this year.  No source for his left-sided abdominal pain and lower back pain was seen with only some inflammation around the pancreas this admission which does not meet the criteria of pancreatitis which would be 2 out of 3 of the following, lipase 3 times the upper limit of normal, characteristic epigastric pain radiating through to the area between the shoulder blades and a CT scan indicating pancreatitis.  His pain is not characteristic of pancreatitis and his lipase level is normal.  Plan:  This patient  clearly has musculoskeletal pain with exacerbation of the pain while lightly dragging one finger over the abdominal wall on the left.  The pain is exacerbated with lifting the patient's legs above the bed thereby flexing the abdominal wall muscles indicating a positive carnet sign.  Nothing further to do from a GI point of view at this time.  Thank you for involving me in the care of this patient.      LOS: 0 days   Lucilla Lame, MD, [facility] 03/05/2022, 6:10 PM,  Pager [phone] 7am-5pm  Check AMION for 5pm -7am coverage and on weekends   Note: This dictation was prepared with Dragon dictation along with smaller phrase technology. Any transcriptional errors that result from this process are unintentional.

## 2022-03-05 NOTE — Progress Notes (Signed)
Inpatient Diabetes Program Recommendations  AACE/ADA: New Consensus Statement on Inpatient Glycemic Control   Target Ranges:  Prepandial:   less than 140 mg/dL      Peak postprandial:   less than 180 mg/dL (1-2 hours)      Critically ill patients:  140 - 180 mg/dL     Latest Reference Range & Units 03/05/22 06:03  Glucose 70 - 99 mg/dL 331 (H)    Latest Reference Range & Units 08/23/21 04:03  Hemoglobin A1C 4.8 - 5.6 % 8.3 (H)   Review of Glycemic Control  Diabetes history: DM2 Outpatient Diabetes medications: Basaglar 32 units daily,  Lyumjev 22 units with breakfast/16 units with lunch/16 units with supper, Farxiga 10 QAM, Trulicity 3 mg Qweek Current orders for Inpatient glycemic control: Novolog 0-9 units TID with meals, Novolog 0-5 units QHS  Inpatient Diabetes Program Recommendations:    Insulin: May want to consider ordering Semglee 17 units daily (based on 113.4 kg x 0.15 units).  NOTE: Currently in ED with abdominal pain and lab glucose 331. Per chart review patient sees Dr. Kelton Pillar; last seen 09/04/21 - at visit Farxiga increased from 5 to 10 mg, Trulicity increased from 1.5 to 3 mg Qweek and given Humalog correction scale 130/25 (Target glucose 130 mg/dl - 1 unit per 25 mg/dl above target). Also noted patient has [employer] medication assistance.  Thanks, Barnie Alderman, RN, MSN, CDE Diabetes Coordinator Inpatient Diabetes Program [phone] (Team Pager from 8am to 5pm)

## 2022-03-05 NOTE — Progress Notes (Deleted)
Name: Edward Schneider.  Age/ Sex: 66 y.o., male   MRN/ DOB: [id number], Jul 11, 1956     PCP: [name], NP   Reason for Endocrinology Evaluation: Type 2 Diabetes Mellitus  Initial Endocrine Consultative Visit: 01/24/2019    PATIENT IDENTIFIER: [name]. is a 66 y.o. male with a past medical history of T2DM. The patient has followed with Endocrinology clinic since 01/24/2019 for consultative assistance with management of his diabetes.  DIABETIC HISTORY:  [name] was diagnosed with T2DM in 2017. He has been on Jardiance in 2017 but due to cost was discontinued, as well as [place]. His hemoglobin A1c has ranged from 6.2% in 2018, peaking at 10.0 % in 2019.  On his initial visit to our clinic his A1c 10.9% , he was on metformin which we stopped in 04/2019 due to diarrhea.   S/P PCI with DES 08/2019 SUBJECTIVE:   During the last visit (02/27/2021): A1c 8.8%. We continued [place], humalog as well as Ozempic.    Today (03/05/2022): [name] is here for a follow up on his diabetes management. He checks his blood sugars 2-3 times daily through freestyle libre. The patient have has not had a hypoglycemic episodes since the last clinic visit .    Patient had an ED visit for abdominal pain 03/05/2022  HOME DIABETES REGIMEN:  Basaglar 30 units QHS  Humalog 22 units with Breakfast and continue 16 units with Lunch and 16 units with supper Trulicity 1.5  mg weekly  Farxiga 5 mg daily     CONTINUOUS GLUCOSE MONITORING RECORD INTERPRETATION    Dates of Recording:11/4-11/17/2022  Sensor description:freestyle libre  Results statistics:   CGM use % of time 42  Average and SD 207/26.9  Time in range   35 %  % Time Above 180 43  % Time above 250 22  % Time Below target 0    Glycemic patterns summary: Hyperglycemia noted during the day and night   Hyperglycemic episodes  postprandial   Hypoglycemic episodes occurred N/A  Overnight periods: High            DIABETIC COMPLICATIONS: Microvascular complications:  Neuropathy  Denies: CKD, retinopathy  Last eye exam: Completed 03/2020   Macrovascular complications:  CAD and CVA Denies: PVD    HISTORY:  Past Medical History:  Past Medical History:  Diagnosis Date   Allergy    Aortic atherosclerosis (HCC)    Asthma    C. difficile diarrhea    Chronic pain    Collagenous colitis    Coronary artery disease    a.) PCI with 2.75 x 18 mm Resolute Onyx DES x 1 to prox/mid LAD on 09/05/2019   DDD (degenerative disc disease), cervical    DDD (degenerative disc disease), lumbar    GERD (gastroesophageal reflux disease)    Grade I diastolic dysfunction    Hepatic steatosis    Hyperlipidemia    Hypertension    Liver cancer ([place]) 03/2015   Migraines    Myocardial infarction (HCC)     OSA on CPAP    Seizures ([place])    several as child when sick.  None since age 89   Stroke [facility])    'mini-stroke" 30 yrs ago. no deficits.   T2DM (type 2 diabetes mellitus) ([place])    Wears dentures    full upper and lower   Past Surgical History:  Past Surgical History:  Procedure Laterality Date   APPENDECTOMY     BACK  Name: Edward Schneider.  Age/ Sex: 66 y.o., male   MRN/ DOB: [id number], Jul 11, 1956     PCP: [name], NP   Reason for Endocrinology Evaluation: Type 2 Diabetes Mellitus  Initial Endocrine Consultative Visit: 01/24/2019    PATIENT IDENTIFIER: [name]. is a 66 y.o. male with a past medical history of T2DM. The patient has followed with Endocrinology clinic since 01/24/2019 for consultative assistance with management of his diabetes.  DIABETIC HISTORY:  [name] was diagnosed with T2DM in 2017. He has been on Jardiance in 2017 but due to cost was discontinued, as well as [place]. His hemoglobin A1c has ranged from 6.2% in 2018, peaking at 10.0 % in 2019.  On his initial visit to our clinic his A1c 10.9% , he was on metformin which we stopped in 04/2019 due to diarrhea.   S/P PCI with DES 08/2019 SUBJECTIVE:   During the last visit (02/27/2021): A1c 8.8%. We continued [place], humalog as well as Ozempic.    Today (03/05/2022): [name] is here for a follow up on his diabetes management. He checks his blood sugars 2-3 times daily through freestyle libre. The patient have has not had a hypoglycemic episodes since the last clinic visit .    Patient had an ED visit for abdominal pain 03/05/2022  HOME DIABETES REGIMEN:  Basaglar 30 units QHS  Humalog 22 units with Breakfast and continue 16 units with Lunch and 16 units with supper Trulicity 1.5  mg weekly  Farxiga 5 mg daily     CONTINUOUS GLUCOSE MONITORING RECORD INTERPRETATION    Dates of Recording:11/4-11/17/2022  Sensor description:freestyle libre  Results statistics:   CGM use % of time 42  Average and SD 207/26.9  Time in range   35 %  % Time Above 180 43  % Time above 250 22  % Time Below target 0    Glycemic patterns summary: Hyperglycemia noted during the day and night   Hyperglycemic episodes  postprandial   Hypoglycemic episodes occurred N/A  Overnight periods: High            DIABETIC COMPLICATIONS: Microvascular complications:  Neuropathy  Denies: CKD, retinopathy  Last eye exam: Completed 03/2020   Macrovascular complications:  CAD and CVA Denies: PVD    HISTORY:  Past Medical History:  Past Medical History:  Diagnosis Date   Allergy    Aortic atherosclerosis (HCC)    Asthma    C. difficile diarrhea    Chronic pain    Collagenous colitis    Coronary artery disease    a.) PCI with 2.75 x 18 mm Resolute Onyx DES x 1 to prox/mid LAD on 09/05/2019   DDD (degenerative disc disease), cervical    DDD (degenerative disc disease), lumbar    GERD (gastroesophageal reflux disease)    Grade I diastolic dysfunction    Hepatic steatosis    Hyperlipidemia    Hypertension    Liver cancer ([place]) 03/2015   Migraines    Myocardial infarction (HCC)     OSA on CPAP    Seizures ([place])    several as child when sick.  None since age 89   Stroke [facility])    'mini-stroke" 30 yrs ago. no deficits.   T2DM (type 2 diabetes mellitus) ([place])    Wears dentures    full upper and lower   Past Surgical History:  Past Surgical History:  Procedure Laterality Date   APPENDECTOMY     BACK  Name: Klyde Banka.  Age/ Sex: 65 y.o., male   MRN/ DOB: [id number], [birth year]     PCP: [name], NP   Reason for Endocrinology Evaluation: Type 2 Diabetes Mellitus  Initial Endocrine Consultative Visit: 01/24/2019    PATIENT IDENTIFIER: Mr. Ja Edward Schneider. is a 66 y.o. male with a past medical history of T2DM. The patient has followed with Endocrinology clinic since 01/24/2019 for consultative assistance with management of his diabetes.  DIABETIC HISTORY:  [name] was diagnosed with T2DM in 2017. He has been on Jardiance in 2017 but due to cost was discontinued, as well as [place]. His hemoglobin A1c has ranged from 6.2% in 2018, peaking at 10.0 % in 2019.  On his initial visit to our clinic his A1c 10.9% , he was on metformin which we stopped in 04/2019 due to diarrhea.   S/P PCI with DES 08/2019 SUBJECTIVE:   During the last visit (02/27/2021): A1c 8.8%. We continued [place], humalog as well as Ozempic.    Today (03/05/2022): [name] is here for a follow up on his diabetes management. He checks his blood sugars 2-3 times daily through freestyle libre. The patient have has not had a hypoglycemic episodes since the last clinic visit .    Patient had an ED visit for abdominal pain 03/05/2022  HOME DIABETES REGIMEN:  Basaglar 30 units QHS  Humalog 22 units with Breakfast and continue 16 units with Lunch and 16 units with supper Trulicity 1.5  mg weekly  Farxiga 5 mg daily     CONTINUOUS GLUCOSE MONITORING RECORD INTERPRETATION    Dates of Recording:11/4-11/17/2022  Sensor description:freestyle libre  Results statistics:   CGM use % of time 42  Average and SD 207/26.9  Time in range   35 %  % Time Above 180 43  % Time above 250 22  % Time Below target 0    Glycemic patterns summary: Hyperglycemia noted during the day and night   Hyperglycemic episodes  postprandial   Hypoglycemic episodes occurred N/A  Overnight periods: High            DIABETIC COMPLICATIONS: Microvascular complications:  Neuropathy  Denies: CKD, retinopathy  Last eye exam: Completed 03/2020   Macrovascular complications:  CAD and CVA Denies: PVD    HISTORY:  Past Medical History:  Past Medical History:  Diagnosis Date   Allergy    Aortic atherosclerosis (HCC)    Asthma    C. difficile diarrhea    Chronic pain    Collagenous colitis    Coronary artery disease    a.) PCI with 2.75 x 18 mm Resolute Onyx DES x 1 to prox/mid LAD on 09/05/2019   DDD (degenerative disc disease), cervical    DDD (degenerative disc disease), lumbar    GERD (gastroesophageal reflux disease)    Grade I diastolic dysfunction    Hepatic steatosis    Hyperlipidemia    Hypertension    Liver cancer ([place]) 03/2015   Migraines    Myocardial infarction (HCC)     OSA on CPAP    Seizures ([place])    several as child when sick.  None since age 37   Stroke [facility])    'mini-stroke" 30 yrs ago. no deficits.   T2DM (type 2 diabetes mellitus) ([place])    Wears dentures    full upper and lower   Past Surgical History:  Past Surgical History:  Procedure Laterality Date   APPENDECTOMY     BACK  Name: Edward Schneider.  Age/ Sex: 66 y.o., male   MRN/ DOB: [id number], Jul 11, 1956     PCP: [name], NP   Reason for Endocrinology Evaluation: Type 2 Diabetes Mellitus  Initial Endocrine Consultative Visit: 01/24/2019    PATIENT IDENTIFIER: [name]. is a 66 y.o. male with a past medical history of T2DM. The patient has followed with Endocrinology clinic since 01/24/2019 for consultative assistance with management of his diabetes.  DIABETIC HISTORY:  [name] was diagnosed with T2DM in 2017. He has been on Jardiance in 2017 but due to cost was discontinued, as well as [place]. His hemoglobin A1c has ranged from 6.2% in 2018, peaking at 10.0 % in 2019.  On his initial visit to our clinic his A1c 10.9% , he was on metformin which we stopped in 04/2019 due to diarrhea.   S/P PCI with DES 08/2019 SUBJECTIVE:   During the last visit (02/27/2021): A1c 8.8%. We continued [place], humalog as well as Ozempic.    Today (03/05/2022): [name] is here for a follow up on his diabetes management. He checks his blood sugars 2-3 times daily through freestyle libre. The patient have has not had a hypoglycemic episodes since the last clinic visit .    Patient had an ED visit for abdominal pain 03/05/2022  HOME DIABETES REGIMEN:  Basaglar 30 units QHS  Humalog 22 units with Breakfast and continue 16 units with Lunch and 16 units with supper Trulicity 1.5  mg weekly  Farxiga 5 mg daily     CONTINUOUS GLUCOSE MONITORING RECORD INTERPRETATION    Dates of Recording:11/4-11/17/2022  Sensor description:freestyle libre  Results statistics:   CGM use % of time 42  Average and SD 207/26.9  Time in range   35 %  % Time Above 180 43  % Time above 250 22  % Time Below target 0    Glycemic patterns summary: Hyperglycemia noted during the day and night   Hyperglycemic episodes  postprandial   Hypoglycemic episodes occurred N/A  Overnight periods: High            DIABETIC COMPLICATIONS: Microvascular complications:  Neuropathy  Denies: CKD, retinopathy  Last eye exam: Completed 03/2020   Macrovascular complications:  CAD and CVA Denies: PVD    HISTORY:  Past Medical History:  Past Medical History:  Diagnosis Date   Allergy    Aortic atherosclerosis (HCC)    Asthma    C. difficile diarrhea    Chronic pain    Collagenous colitis    Coronary artery disease    a.) PCI with 2.75 x 18 mm Resolute Onyx DES x 1 to prox/mid LAD on 09/05/2019   DDD (degenerative disc disease), cervical    DDD (degenerative disc disease), lumbar    GERD (gastroesophageal reflux disease)    Grade I diastolic dysfunction    Hepatic steatosis    Hyperlipidemia    Hypertension    Liver cancer ([place]) 03/2015   Migraines    Myocardial infarction (HCC)     OSA on CPAP    Seizures ([place])    several as child when sick.  None since age 89   Stroke [facility])    'mini-stroke" 30 yrs ago. no deficits.   T2DM (type 2 diabetes mellitus) ([place])    Wears dentures    full upper and lower   Past Surgical History:  Past Surgical History:  Procedure Laterality Date   APPENDECTOMY     BACK  Name: Edward Schneider.  Age/ Sex: 66 y.o., male   MRN/ DOB: [id number], Jul 11, 1956     PCP: [name], NP   Reason for Endocrinology Evaluation: Type 2 Diabetes Mellitus  Initial Endocrine Consultative Visit: 01/24/2019    PATIENT IDENTIFIER: [name]. is a 66 y.o. male with a past medical history of T2DM. The patient has followed with Endocrinology clinic since 01/24/2019 for consultative assistance with management of his diabetes.  DIABETIC HISTORY:  [name] was diagnosed with T2DM in 2017. He has been on Jardiance in 2017 but due to cost was discontinued, as well as [place]. His hemoglobin A1c has ranged from 6.2% in 2018, peaking at 10.0 % in 2019.  On his initial visit to our clinic his A1c 10.9% , he was on metformin which we stopped in 04/2019 due to diarrhea.   S/P PCI with DES 08/2019 SUBJECTIVE:   During the last visit (02/27/2021): A1c 8.8%. We continued [place], humalog as well as Ozempic.    Today (03/05/2022): [name] is here for a follow up on his diabetes management. He checks his blood sugars 2-3 times daily through freestyle libre. The patient have has not had a hypoglycemic episodes since the last clinic visit .    Patient had an ED visit for abdominal pain 03/05/2022  HOME DIABETES REGIMEN:  Basaglar 30 units QHS  Humalog 22 units with Breakfast and continue 16 units with Lunch and 16 units with supper Trulicity 1.5  mg weekly  Farxiga 5 mg daily     CONTINUOUS GLUCOSE MONITORING RECORD INTERPRETATION    Dates of Recording:11/4-11/17/2022  Sensor description:freestyle libre  Results statistics:   CGM use % of time 42  Average and SD 207/26.9  Time in range   35 %  % Time Above 180 43  % Time above 250 22  % Time Below target 0    Glycemic patterns summary: Hyperglycemia noted during the day and night   Hyperglycemic episodes  postprandial   Hypoglycemic episodes occurred N/A  Overnight periods: High            DIABETIC COMPLICATIONS: Microvascular complications:  Neuropathy  Denies: CKD, retinopathy  Last eye exam: Completed 03/2020   Macrovascular complications:  CAD and CVA Denies: PVD    HISTORY:  Past Medical History:  Past Medical History:  Diagnosis Date   Allergy    Aortic atherosclerosis (HCC)    Asthma    C. difficile diarrhea    Chronic pain    Collagenous colitis    Coronary artery disease    a.) PCI with 2.75 x 18 mm Resolute Onyx DES x 1 to prox/mid LAD on 09/05/2019   DDD (degenerative disc disease), cervical    DDD (degenerative disc disease), lumbar    GERD (gastroesophageal reflux disease)    Grade I diastolic dysfunction    Hepatic steatosis    Hyperlipidemia    Hypertension    Liver cancer ([place]) 03/2015   Migraines    Myocardial infarction (HCC)     OSA on CPAP    Seizures ([place])    several as child when sick.  None since age 89   Stroke [facility])    'mini-stroke" 30 yrs ago. no deficits.   T2DM (type 2 diabetes mellitus) ([place])    Wears dentures    full upper and lower   Past Surgical History:  Past Surgical History:  Procedure Laterality Date   APPENDECTOMY     BACK

## 2022-03-05 NOTE — ED Triage Notes (Signed)
Pt to triage via w/c, appears uncomfortable; st x 3 days having mid lower back pain radiating around into mid lower abd accomp by N/V and constipation; denies hx of same

## 2022-03-05 NOTE — ED Provider Notes (Signed)
[facility] Provider Note    Event Date/Time   First MD Initiated Contact with Patient 03/05/22 [phone]     (approximate)   History   Abdominal Pain   HPI  Edward Mori. is a 66 y.o. male with a history of aortic atherosclerosis, C. difficile diarrhea, collagenous colitis, coronary artery disease, liver cancer, hypertension, hyperlipidemia, type II diabetic, chronic abdominal pain who presents for evaluation of abdominal pain.  Patient is complaining of severe sharp diffuse abdominal pain and low back pain for the last 3 days.  Patient reports that this pain is nothing like his chronic pain and much much severe.  He was moving some furniture 3 days ago and the pain started and he is concerned that he might have pulled something.  He has had nausea and vomiting associated with it.  He is constipated which is chronic for him.  He denies chest pain or shortness of breath.  He also complains of urinary frequency and reports that he has been urinating every 15 minutes.     Past Medical History:  Diagnosis Date   Allergy    Aortic atherosclerosis (HCC)    Asthma    C. difficile diarrhea    Chronic pain    Collagenous colitis    Coronary artery disease    a.) PCI with 2.75 x 18 mm Resolute Onyx DES x 1 to prox/mid LAD on 09/05/2019   DDD (degenerative disc disease), cervical    DDD (degenerative disc disease), lumbar    GERD (gastroesophageal reflux disease)    Grade I diastolic dysfunction    Hepatic steatosis    Hyperlipidemia    Hypertension    Liver cancer ([place]) 03/2015   Migraines    Myocardial infarction (HCC)     OSA on CPAP    Seizures ([place])    several as child when sick.  None since age 88   Stroke [facility])    'mini-stroke" 30 yrs ago. no deficits.   T2DM (type 2 diabetes mellitus) ([place])    Wears dentures    full upper and lower    Past Surgical History:  Procedure Laterality Date   APPENDECTOMY     BACK SURGERY     CARDIAC  CATHETERIZATION     No stent placed in his "81's"   CERVICAL FUSION     COLONOSCOPY WITH PROPOFOL N/A 03/06/2016   Procedure: COLONOSCOPY WITH PROPOFOL;  Surgeon: Lucilla Lame, MD;  Location: [place];  Service: Endoscopy;  Laterality: N/A;  requests early   COLONOSCOPY WITH PROPOFOL N/A 06/18/2020   Procedure: COLONOSCOPY WITH PROPOFOL;  Surgeon: Lucilla Lame, MD;  Location: [facility] ENDOSCOPY;  Service: Endoscopy;  Laterality: N/A;   ESOPHAGOGASTRODUODENOSCOPY (EGD) WITH PROPOFOL N/A 09/20/2017   Procedure: ESOPHAGOGASTRODUODENOSCOPY (EGD) WITH PROPOFOL;  Surgeon: Lucilla Lame, MD;  Location: [place];  Service: Endoscopy;  Laterality: N/A;  Diabetic - oral meds   FINGER SURGERY Left    INTRAVASCULAR PRESSURE WIRE/FFR STUDY N/A 09/05/2019   Procedure: INTRAVASCULAR PRESSURE WIRE/FFR STUDY;  Surgeon: Nelva Bush, MD;  Location: [place] CV LAB;  Service: Cardiovascular;  Laterality: N/A;   KNEE SURGERY Right    LEFT HEART CATH AND CORONARY ANGIOGRAPHY Left 09/05/2019   Procedure: LEFT HEART CATH AND CORONARY ANGIOGRAPHY (2.75 x 18 mm Resolute Onyx DES x 1 to prox/mid LAD);  Surgeon: Nelva Bush, MD;  Location: [place] CV LAB;  Service: Cardiovascular;  Laterality: Left;   NECK SURGERY  spleen surgery     TOE SURGERY Right    TOTAL HIP ARTHROPLASTY Right 06/03/2021   Procedure: TOTAL HIP ARTHROPLASTY ANTERIOR APPROACH;  Surgeon: Hessie Knows, MD;  Location: [facility];  Service: Orthopedics;  Laterality: Right;   TOTAL KNEE ARTHROPLASTY Right 08/22/2021   Procedure: TOTAL KNEE ARTHROPLASTY;  Surgeon: Hessie Knows, MD;  Location: [facility];  Service: Orthopedics;  Laterality: Right;     Physical Exam   Triage Vital Signs: ED Triage Vitals  Enc Vitals Group     BP 03/05/22 0534 (!) 150/101     Pulse Rate 03/05/22 0534 (!) 46     Resp 03/05/22 0534 (!) 21     Temp 03/05/22 0534 98.4 F (36.9 C)     Temp Source 03/05/22 0534 Oral     SpO2 03/05/22  0534 96 %     Weight 03/05/22 0534 250 lb (113.4 kg)     Height 03/05/22 0534 '5\' 8"'$  (1.727 m)     Head Circumference --      Peak Flow --      Pain Score 03/05/22 0533 10     Pain Loc --      Pain Edu? --      Excl. in [place]? --     Most recent vital signs: Vitals:   03/05/22 0611 03/05/22 0630  BP: 134/80 (!) 127/91  Pulse: (!) 103 (!) 105  Resp: 14 19  Temp:    SpO2: 95% 92%     Constitutional: Alert and oriented. Screaming and moaning in pain. HEENT:      Head: Normocephalic and atraumatic.         Eyes: Conjunctivae are normal. Sclera is non-icteric.       Mouth/Throat: Mucous membranes are moist.       Neck: Supple with no signs of meningismus. Cardiovascular: Regular rate and rhythm. No murmurs, gallops, or rubs. 2+ symmetrical distal pulses are present in all extremities.  Respiratory: Normal respiratory effort. Lungs are clear to auscultation bilaterally.  Gastrointestinal: Abdomen distended and diffusely tender to palpation, patient screams, cusses and jumps from the bed with minimal palpation of the abdomen Genitourinary: No CVA tenderness. Musculoskeletal:  No edema, cyanosis, or erythema of extremities. Neurologic: Normal speech and language. Face is symmetric. Moving all extremities. No gross focal neurologic deficits are appreciated. Skin: Skin is warm, dry and intact. No rash noted. Psychiatric: Mood and affect are normal. Speech and behavior are normal.  ED Results / Procedures / Treatments   Labs (all labs ordered are listed, but only abnormal results are displayed) Labs Reviewed  CBC WITH DIFFERENTIAL/PLATELET - Abnormal; Notable for the following components:      Result Value   WBC 14.9 (*)    Hemoglobin 12.7 (*)    Neutro Abs 10.4 (*)    Monocytes Absolute 1.7 (*)    Abs Immature Granulocytes 0.09 (*)    All other components within normal limits  COMPREHENSIVE METABOLIC PANEL - Abnormal; Notable for the following components:   Sodium 134 (*)     Chloride 97 (*)    Glucose, Bld 331 (*)    Total Protein 8.3 (*)    All other components within normal limits  LIPASE, BLOOD  URINALYSIS, COMPLETE (UACMP) WITH MICROSCOPIC  TROPONIN I (HIGH SENSITIVITY)     EKG  ED ECG REPORT I, Rudene Re, the attending physician, personally viewed and interpreted this ECG.  Sinus tachycardia with a rate of 112, normal intervals, normal axis, no ST elevations or  depressions.  RADIOLOGY I, Rudene Re, attending MD, have personally viewed and interpreted the images obtained during this visit as below:  CT concerning for pancreatitis   ___________________________________________________ Interpretation by Radiologist:  CT ABDOMEN PELVIS W CONTRAST  Result Date: 03/05/2022 CLINICAL DATA:  66 year old male with history of 3 days of mid to lower back pain radiating into the mid to lower abdomen, with some associated nausea and vomiting. EXAM: CT ABDOMEN AND PELVIS WITH CONTRAST TECHNIQUE: Multidetector CT imaging of the abdomen and pelvis was performed using the standard protocol following bolus administration of intravenous contrast. RADIATION DOSE REDUCTION: This exam was performed according to the departmental dose-optimization program which includes automated exposure control, adjustment of the mA and/or kV according to patient size and/or use of iterative reconstruction technique. CONTRAST:  165m OMNIPAQUE IOHEXOL 300 MG/ML  SOLN COMPARISON:  CT the abdomen and pelvis 12/25/2021. FINDINGS: Lower chest: Unremarkable. Hepatobiliary: Diffuse low attenuation throughout the hepatic parenchyma, indicative of a background of hepatic steatosis. There is a suggestion of an ill-defined hypovascular lesion between segments 4A and 4B (axial image 20 of series 2) measuring 1.6 cm in diameter. No intra or extrahepatic biliary ductal dilatation. Gallbladder is normal in appearance. Pancreas: Mild haziness in the peripancreatic fat, most evident adjacent to  the head and uncinate process of the pancreas, suggesting acute pancreatitis. No pancreatic ductal dilatation. No pancreatic mass. No peripancreatic fluid collections. Spleen: Unremarkable. Adrenals/Urinary Tract: Bilateral kidneys and adrenal glands are normal in appearance. No hydroureteronephrosis. Urinary bladder is normal in appearance. Stomach/Bowel: The appearance of the stomach is normal. There is no pathologic dilatation of small bowel or colon. The appendix is not confidently identified and may be surgically absent. Regardless, there are no inflammatory changes noted adjacent to the cecum to suggest the presence of an acute appendicitis at this time. Vascular/Lymphatic: Aortic atherosclerosis, without evidence of aneurysm or dissection in the abdominal or pelvic vasculature. Mildly enlarged lymph node in the hepatoduodenal ligament (axial image 23 of series 2), nonspecific. No other lymphadenopathy noted elsewhere in the abdomen or pelvis. Reproductive: Prostate gland and seminal vesicles are unremarkable in appearance. Other: No significant volume of ascites.  No pneumoperitoneum. Musculoskeletal: Rod and screw fixation hardware extending from L4-S1 with interbody cages at L4-L5 and L5-S1 interspaces. There are no aggressive appearing lytic or blastic lesions noted in the visualized portions of the skeleton. IMPRESSION: 1. Inflammatory changes surrounding the pancreas concerning for an acute pancreatitis. No evidence of pancreatic necrosis and no pancreatic pseudocyst noted at this time. 2. Severe hepatic steatosis. There is a suggestion of a hypovascular lesion in the left lobe of the liver between segments 4A and 4B, but this is indeterminate on today's examination. Additionally, there is a mildly enlarged hepatoduodenal ligament lymph node, which is nonspecific. For further evaluation of both of these findings, follow-up nonemergent abdominal MRI with and without IV gadolinium is recommended in the  near future to provide definitive characterization. 3. Aortic atherosclerosis. Electronically Signed   By: DVinnie LangtonM.D.   On: 03/05/2022 07:21      PROCEDURES:  Critical Care performed: No  Procedures    IMPRESSION / MDM / ASSESSMENT AND PLAN / ED COURSE  I reviewed the triage vital signs and the nursing notes.  66y.o. male with a history of aortic atherosclerosis, C. difficile diarrhea, collagenous colitis, coronary artery disease, liver cancer, hypertension, hyperlipidemia, type II diabetic, chronic abdominal pain who presents for evaluation of abdominal pain.  Patient complained of 3 days of diffuse sharp  abdominal pain and low back pain associated with nausea and vomiting.  Patient is screaming and moaning in pain.  Minimal palpation of the abdomen makes patient jump in bed and scream in pain.  Abdomen does seem distended.  Ddx: SBO versus mesenteric ischemia versus constipation versus kidney stone versus bowel perforation versus acute exacerbation of patient's chronic pain versus AAA.  Less likely dissection with the pain ongoing for 3 days and normal stable vital signs.   Plan: We will give IV morphine and Zofran for pain.  We will send patient for CT of the abdomen.  We will get an EKG, CBC, CMP, lipase, urinalysis, troponin.  Patient placed on telemetry for close monitoring of cardiorespiratory status.   MEDICATIONS GIVEN IN ED: Medications  morphine (PF) 4 MG/ML injection 4 mg (4 mg Intravenous Given 03/05/22 0608)  ondansetron (ZOFRAN) injection 4 mg (4 mg Intravenous Given 03/05/22 0608)  iohexol (OMNIPAQUE) 300 MG/ML solution 100 mL (100 mLs Intravenous Contrast Given 03/05/22 0655)  HYDROmorphone (DILAUDID) injection 0.5 mg (0.5 mg Intravenous Given 03/05/22 0745)  lactated ringers bolus 1,000 mL (1,000 mLs Intravenous New Bag/Given 03/05/22 0745)     ED COURSE: CT concerning for pancreatitis.  White count elevated 14.9.  Normal FTs and lipase.  Patient continues to  complain of 10 out of 10 after morphine.  Will give Dilaudid and consult hospitalist for admission for pain control and hydration.  Hyperglycemia with no evidence of DKA.   Consults: Hospitalist   EMR reviewed including patient's recent visit with GI for chronic abdominal pain from 2 weeks ago    FINAL CLINICAL IMPRESSION(S) / ED DIAGNOSES   Final diagnoses:  Acute pancreatitis, unspecified complication status, unspecified pancreatitis type     Rx / DC Orders   ED Discharge Orders     None        Note:  This document was prepared using Dragon voice recognition software and may include unintentional dictation errors.   Please note:  Patient was evaluated in Emergency Department today for the symptoms described in the history of present illness. Patient was evaluated in the context of the global COVID-19 pandemic, which necessitated consideration that the patient might be at risk for infection with the SARS-CoV-2 virus that causes COVID-19. Institutional protocols and algorithms that pertain to the evaluation of patients at risk for COVID-19 are in a state of rapid change based on information released by regulatory bodies including the CDC and federal and state organizations. These policies and algorithms were followed during the patient's care in the ED.  Some ED evaluations and interventions may be delayed as a result of limited staffing during the pandemic.       Alfred Levins, [place], MD 03/05/22 [phone]

## 2022-03-06 ENCOUNTER — Observation Stay: Payer: Medicare Other

## 2022-03-06 DIAGNOSIS — K859 Acute pancreatitis without necrosis or infection, unspecified: Secondary | ICD-10-CM | POA: Diagnosis not present

## 2022-03-06 DIAGNOSIS — R109 Unspecified abdominal pain: Secondary | ICD-10-CM | POA: Diagnosis not present

## 2022-03-06 DIAGNOSIS — K760 Fatty (change of) liver, not elsewhere classified: Secondary | ICD-10-CM | POA: Diagnosis not present

## 2022-03-06 DIAGNOSIS — K5900 Constipation, unspecified: Secondary | ICD-10-CM

## 2022-03-06 DIAGNOSIS — R1011 Right upper quadrant pain: Secondary | ICD-10-CM | POA: Diagnosis not present

## 2022-03-06 LAB — HIV ANTIBODY (ROUTINE TESTING W REFLEX): HIV Screen 4th Generation wRfx: NONREACTIVE

## 2022-03-06 LAB — GLUCOSE, CAPILLARY: Glucose-Capillary: 283 mg/dL — ABNORMAL HIGH (ref 70–99)

## 2022-03-06 MED ORDER — SENNOSIDES-DOCUSATE SODIUM 8.6-50 MG PO TABS: 1.0000 | ORAL_TABLET | Freq: Two times a day (BID) | ORAL | 0 refills | Status: DC

## 2022-03-06 MED ORDER — POLYETHYLENE GLYCOL 3350 17 G PO PACK: 17.0000 g | PACK | Freq: Two times a day (BID) | ORAL | 0 refills | Status: DC

## 2022-03-06 MED ORDER — BISACODYL 5 MG PO TBEC: 10.0000 mg | DELAYED_RELEASE_TABLET | Freq: Every day | ORAL | 1 refills | Status: AC

## 2022-03-06 NOTE — Discharge Summary (Signed)
Physician Discharge Summary   Patient: Edward Schneider. MRN: [id number] DOB: May 02, 1956  Admit date:     03/05/2022  Discharge date: 03/06/22  Discharge Physician: Fritzi Mandes   PCP: [name], NP   Recommendations at discharge:    F/u PCP in 1-2 weeks  Discharge Diagnoses: Chronic abdominal suspected Constipation  Hospital Course: [name]. is a 66 y.o. male with medical history significant of aortic atherosclerosis, C diff diarrhea, collagenase colitis, CAD, liver cancer, chronic abdominal pain, hyperlipidemia hypertension and type II diabetes comes emergency room with three day of abdominal pain generalized [name] in the epigastric area without nausea and vomiting. Patient reports he has chronic pain but has been much severe. He was moving some furniture three days ago and thereafter started having pain.   In the ER CT scan shows. Inflammatory changes surrounding the pancreas concerning for an acute pancreatitis. No evidence of pancreatic necrosis and no pancreatic pseudocyst noted at this time. 2. Severe hepatic steatosis. There is a suggestion of a hypovascular lesion in the left lobe of the liver between segments 4A and 4B, but this is indeterminate on today's examination. Additionally, there is a mildly enlarged hepatoduodenal ligament lymph node, which is nonspecific. For further evaluation of both of these findings, follow-up nonemergent abdominal MRI with and without IV gadolinium is recommended in the near future to provide definitive characterization. 3. Aortic atherosclerosis.   Lipase 45. Patient continues to have abdominal pain no vomiting  Acute on chronic abdominal pain--suspect Constipation contributing given left sided pain mild pancreatitis (on C T)with normal lipase--clinicaly does not have pancreatitis -- clear liquid diet--tolerating soft diet --- G.I. consultation with Dr. Thayer Headings to offer -- ultrasound abdomen neg for GB issue --  patient denies any history of alcohol --Pt reports chronic constipation ann at times does not have BM for 9 days!! --discussed bowel regimen and prescribed it also. Pt tolerated po diet well   [name] continue PPI   hypertension will resume home meds   type II diabetes with atherosclerosis and hyperlipidemia -- insulin long-acting with sliding scale -- statins   history of hepatic steatosis and liver cancer -- continue to monitor  Pt will discharge to home today. Wife in the room.      Consultants: GI Procedures performed: none  Disposition: Home Diet recommendation:  Discharge Diet Orders (From admission, onward)     Start     Ordered   03/06/22 0000  Diet - low sodium heart healthy        03/06/22 1004           Cardiac diet DISCHARGE MEDICATION: Allergies as of 03/06/2022       Reactions   Bee Venom Anaphylaxis   Crestor [rosuvastatin Calcium] Shortness Of Breath, Swelling   Fentanyl Itching, Hives   blisters Patch   Gabapentin Diarrhea   Severe diarrhea which caused incontinence, loss of appetite and weight loss.   Shellfish Allergy Anaphylaxis, Swelling   Shrimp causes throat to swell and tingling in tongue.    Fire [employer]    Furosemide Nausea And Vomiting   Buprenorphine Hcl Itching   Chlorhexidine Gluconate Itching, Rash   Simvastatin Diarrhea        Medication List     STOP taking these medications    cephALEXin 500 MG capsule Commonly known as: KEFLEX       TAKE these medications    acetaminophen 650 MG CR tablet Commonly known as: TYLENOL Take 1,300 mg by mouth  every 8 (eight) hours.   albuterol 108 (90 Base) MCG/ACT inhaler Commonly known as: Ventolin HFA Inhale 2 puffs into the lungs every 6 (six) hours as needed for wheezing or shortness of breath.   aspirin EC 81 MG tablet Take 81 mg by mouth daily. Swallow whole.   atorvastatin 80 MG tablet Commonly known as: LIPITOR TAKE 1 TABLET BY MOUTH AT BEDTIME   Basaglar KwikPen 100  UNIT/ML Inject 32 Units into the skin daily.   benazepril 10 MG tablet Commonly known as: LOTENSIN Take 1 tablet by mouth once daily   bisacodyl 5 MG EC tablet Commonly known as: Dulcolax Take 2 tablets (10 mg total) by mouth daily in the afternoon.   Breztri Aerosphere 160-9-4.8 MCG/ACT Aero Generic drug: Budeson-Glycopyrrol-Formoterol Inhale 2 puffs into the lungs in the morning and at bedtime.   calcium carbonate 600 MG Tabs tablet Commonly known as: OS-CAL Take 600 mg by mouth daily.   Centrum Silver 50+Men Tabs Take 1 tablet by mouth daily.   cetirizine 10 MG tablet Commonly known as: ZYRTEC TAKE 1 TABLET BY MOUTH EVERY DAY   CINNAMON PO Take 1,000 mg by mouth 2 (two) times daily.   clobetasol cream 0.05 % Commonly known as: TEMOVATE Apply 1 application topically 2 (two) times daily.   Comfort EZ Pen Needles 32G X 4 MM Misc Generic drug: Insulin Pen Needle   cyclobenzaprine 10 MG tablet Commonly known as: FLEXERIL Take 1 tablet (10 mg total) by mouth 3 (three) times daily as needed for muscle spasms.   dapagliflozin propanediol 10 MG Tabs tablet Commonly known as: Farxiga Take 1 tablet (10 mg total) by mouth daily before breakfast.   diclofenac Sodium 1 % Gel Commonly known as: VOLTAREN Apply 2 g topically 4 (four) times daily as needed (pain).   diphenhydrAMINE 25 mg capsule Commonly known as: BENADRYL Take 50 mg by mouth in the morning, at noon, and at bedtime.   doxepin 25 MG capsule Commonly known as: SINEQUAN Take 50 mg by mouth at bedtime. What changed: Another medication with the same name was removed. Continue taking this medication, and follow the directions you see here.   DULoxetine 60 MG capsule Commonly known as: CYMBALTA Take 1 capsule (60 mg total) by mouth at bedtime. What changed: Another medication with the same name was removed. Continue taking this medication, and follow the directions you see here.   EPINEPHrine 0.3 mg/0.3 mL  Soaj injection Commonly known as: EPI-PEN Inject 0.3 mg into the muscle as needed for anaphylaxis.   ezetimibe 10 MG tablet Commonly known as: ZETIA Take 1 tablet by mouth once daily   famotidine 20 MG tablet Commonly known as: PEPCID TAKE 1 TABLET BY MOUTH TWICE DAILY   FreeStyle Libre 2 Sensor Misc Use as instructed to check blood sugar daily   GINGER PO Take 1 tablet by mouth daily.   Ginseng 100 MG Caps Take 100 mg by mouth daily.   IRON-C PO Take 30 mg by mouth daily.   isosorbide mononitrate 60 MG 24 hr tablet Commonly known as: IMDUR Take 1 tablet by mouth once daily   lidocaine 2 % solution Commonly known as: XYLOCAINE Use as directed 15 mLs in the mouth or throat as needed for mouth pain.   Lyumjev KwikPen 100 UNIT/ML KwikPen Generic drug: Insulin Lispro-aabc 22 units with Breakfast and continue 16 units with Lunch and 16 units with supper   mometasone 0.1 % ointment Commonly known as: ELOCON Apply topically 2 (two)  times daily as needed.   naproxen sodium 220 MG tablet Commonly known as: ALEVE Take 440 mg by mouth daily as needed (body aches).   Narcan 4 MG/0.1ML Liqd nasal spray kit Generic drug: naloxone Place 0.4 mg into the nose as needed (opioid overdose).   nitroGLYCERIN 0.4 MG SL tablet Commonly known as: NITROSTAT Place 0.4 mg under the tongue every 5 (five) minutes as needed for chest pain.   Omega-3 1000 MG Caps Take 1,000 mg by mouth daily.   oxyCODONE 15 MG immediate release tablet Commonly known as: ROXICODONE Take 15 mg by mouth every 4 (four) hours as needed for pain. What changed: Another medication with the same name was removed. Continue taking this medication, and follow the directions you see here.   pantoprazole 40 MG tablet Commonly known as: PROTONIX Take 1 tablet (40 mg total) by mouth 2 (two) times daily.   Pharmacist Choice Alcohol Pads SMARTSIG:1 Each Topical 4 Times Daily   polyethylene glycol 17 g  packet Commonly known as: MIRALAX / GLYCOLAX Take 17 g by mouth 2 (two) times daily.   pregabalin 100 MG capsule Commonly known as: Lyrica Take 1 capsule (100 mg total) by mouth 2 (two) times daily. What changed: Another medication with the same name was removed. Continue taking this medication, and follow the directions you see here.   PRESCRIPTION MEDICATION CPAP   senna-docusate 8.6-50 MG tablet Commonly known as: Senokot-S Take 1 tablet by mouth 2 (two) times daily. What changed:  when to take this reasons to take this   sucralfate 1 g tablet Commonly known as: CARAFATE TAKE 1 TABLET BY MOUTH 4 TIMES DAILY WITH MEALS AND AT BEDTIME   traMADol 50 MG tablet Commonly known as: ULTRAM Take 1 tablet (50 mg total) by mouth every 6 (six) hours as needed.   traZODone 100 MG tablet Commonly known as: DESYREL TAKE 1 TABLET BY MOUTH AT BEDTIME AS NEEDED FOR SLEEP   Trulance 3 MG Tabs Generic drug: Plecanatide Take 1 tablet by mouth daily.   Trulicity 3 GU/4.4IH Sopn Generic drug: Dulaglutide Inject 3 mg as directed once a week.   vitamin A 10000 UNIT capsule Take 10,000 Units by mouth daily.   vitamin B-12 1000 MCG tablet Commonly known as: CYANOCOBALAMIN Take 1,000 mcg by mouth daily.   vitamin C 1000 MG tablet Take 1,000 mg by mouth daily.   Vitamin D3 25 MCG (1000 UT) Chew Chew 1,000 Units by mouth daily.   Vitamin E 400 units Tabs Take 400 Units by mouth daily.        Follow-up Information     [name], NP. Schedule an appointment as soon as possible for a visit in 1 week(s).   Specialty: Nurse Practitioner Contact information: [place] [address] [phone]         Nelva Bush, MD .   Specialty: Cardiology Contact information: [place] [address] [phone]                Discharge Exam: Danley Danker Weights   03/05/22 0534  Weight: 113.4 kg     Condition at discharge:  fair  The results of significant diagnostics from this hospitalization (including imaging, microbiology, ancillary and laboratory) are listed below for reference.   Imaging Studies: MR CERVICAL SPINE WO CONTRAST  Result Date: 02/11/2022 CLINICAL DATA:  Hemisensory loss R20.0 (ICD-10-CM). Numbness and tingling of right face R20.0, R20.2 (ICD-10-CM). EXAM: MRI CERVICAL SPINE WITHOUT  CONTRAST TECHNIQUE: Multiplanar, multisequence MR imaging of the cervical spine was performed. No intravenous contrast was administered. COMPARISON:  CT cervical spine April 13, 2006. FINDINGS: Alignment: Straightening of the cervical curvature. Small anterolisthesis of C4 over C5. Vertebrae: No fracture, evidence of discitis, or bone lesion. Postsurgical changes from C5-6 interbody fusion. Endplate degenerative changes at C6-7 and more pronounced at C7-T1. Cord: Normal signal and morphology. Posterior Fossa, vertebral arteries, paraspinal tissues: Negative. Disc levels: C2-3: Facet degenerative changes. No significant spinal canal or neural foraminal stenosis. C3-4: Posterior disc osteophyte complex resulting in mild spinal canal stenosis. Uncovertebral and facet degenerative change resulting in moderate bilateral neural foraminal narrowing. C4-5: Posterior disc osteophyte complex resulting in mild spinal canal stenosis. Uncovertebral and facet degenerative changes with prominent hypertrophy of the left facet joint resulting in severe left neural foraminal narrowing. C5-6: Status post fusion. No spinal canal stenosis. Residual uncovertebral degenerative changes resulting in mild left neural foraminal narrowing. C6-7: Posterior disc osteophyte complex resulting mild spinal canal stenosis. Uncovertebral and facet degenerative changes resulting in mild bilateral neural foraminal narrowing. C7-T1: Loss of disc height, posterior disc osteophyte complex resulting in mild spinal canal stenosis. Uncovertebral and facet degenerative changes  resulting severe bilateral neural foraminal narrowing. Degenerative changes have progressed since prior MRI, particularly at C7-T1. IMPRESSION: 1. Mild spinal canal stenosis at C3-4, C4-5, C5-6 and C6-7. 2. Severe bilateral neural foraminal narrowing at C7-T1 and on the left side at C4-5. 3. Moderate bilateral neural foraminal narrowing at C3-4. Electronically Signed   By: Pedro Earls M.D.   On: 02/11/2022 15:21   CT ABDOMEN PELVIS W CONTRAST  Result Date: 03/05/2022 CLINICAL DATA:  66 year old male with history of 3 days of mid to lower back pain radiating into the mid to lower abdomen, with some associated nausea and vomiting. EXAM: CT ABDOMEN AND PELVIS WITH CONTRAST TECHNIQUE: Multidetector CT imaging of the abdomen and pelvis was performed using the standard protocol following bolus administration of intravenous contrast. RADIATION DOSE REDUCTION: This exam was performed according to the departmental dose-optimization program which includes automated exposure control, adjustment of the mA and/or kV according to patient size and/or use of iterative reconstruction technique. CONTRAST:  159m OMNIPAQUE IOHEXOL 300 MG/ML  SOLN COMPARISON:  CT the abdomen and pelvis 12/25/2021. FINDINGS: Lower chest: Unremarkable. Hepatobiliary: Diffuse low attenuation throughout the hepatic parenchyma, indicative of a background of hepatic steatosis. There is a suggestion of an ill-defined hypovascular lesion between segments 4A and 4B (axial image 20 of series 2) measuring 1.6 cm in diameter. No intra or extrahepatic biliary ductal dilatation. Gallbladder is normal in appearance. Pancreas: Mild haziness in the peripancreatic fat, most evident adjacent to the head and uncinate process of the pancreas, suggesting acute pancreatitis. No pancreatic ductal dilatation. No pancreatic mass. No peripancreatic fluid collections. Spleen: Unremarkable. Adrenals/Urinary Tract: Bilateral kidneys and adrenal glands are  normal in appearance. No hydroureteronephrosis. Urinary bladder is normal in appearance. Stomach/Bowel: The appearance of the stomach is normal. There is no pathologic dilatation of small bowel or colon. The appendix is not confidently identified and may be surgically absent. Regardless, there are no inflammatory changes noted adjacent to the cecum to suggest the presence of an acute appendicitis at this time. Vascular/Lymphatic: Aortic atherosclerosis, without evidence of aneurysm or dissection in the abdominal or pelvic vasculature. Mildly enlarged lymph node in the hepatoduodenal ligament (axial image 23 of series 2), nonspecific. No other lymphadenopathy noted elsewhere in the abdomen or pelvis. Reproductive: Prostate gland and seminal vesicles are  unremarkable in appearance. Other: No significant volume of ascites.  No pneumoperitoneum. Musculoskeletal: Rod and screw fixation hardware extending from L4-S1 with interbody cages at L4-L5 and L5-S1 interspaces. There are no aggressive appearing lytic or blastic lesions noted in the visualized portions of the skeleton. IMPRESSION: 1. Inflammatory changes surrounding the pancreas concerning for an acute pancreatitis. No evidence of pancreatic necrosis and no pancreatic pseudocyst noted at this time. 2. Severe hepatic steatosis. There is a suggestion of a hypovascular lesion in the left lobe of the liver between segments 4A and 4B, but this is indeterminate on today's examination. Additionally, there is a mildly enlarged hepatoduodenal ligament lymph node, which is nonspecific. For further evaluation of both of these findings, follow-up nonemergent abdominal MRI with and without IV gadolinium is recommended in the near future to provide definitive characterization. 3. Aortic atherosclerosis. Electronically Signed   By: Vinnie Langton M.D.   On: 03/05/2022 07:21   US Abdomen Limited RUQ (LIVER/GB)  Result Date: 03/06/2022 CLINICAL DATA:  Right upper quadrant  abdominal pain for 3 weeks. EXAM: ULTRASOUND ABDOMEN LIMITED RIGHT UPPER QUADRANT COMPARISON:  Mar 05, 2022.  April 22, 2015. FINDINGS: Gallbladder: No gallstones or wall thickening visualized. No sonographic Murphy sign noted by sonographer. Common bile duct: Diameter: 5 mm which is within normal limits. Liver: No focal lesion identified. Increased echogenicity of hepatic parenchyma is noted suggesting hepatic steatosis. Portal vein is patent on color Doppler imaging with normal direction of blood flow towards the liver. Other: None. IMPRESSION: Hepatic steatosis. No other abnormality seen in the right upper quadrant of the abdomen. Electronically Signed   By: Marijo Conception M.D.   On: 03/06/2022 09:21   SLEEP STUDY DOCUMENTS  Result Date: 02/11/2022 Ordered by an unspecified provider.   Microbiology: Results for orders placed or performed during the hospital encounter of 08/20/21  SARS CORONAVIRUS 2 (TAT 6-24 HRS) Nasopharyngeal Nasopharyngeal Swab     Status: None   Collection Time: 08/20/21  9:00 AM   Specimen: Nasopharyngeal Swab  Result Value Ref Range Status   SARS Coronavirus 2 NEGATIVE NEGATIVE Final    Comment: (NOTE) SARS-CoV-2 target nucleic acids are NOT DETECTED.  The SARS-CoV-2 RNA is generally detectable in upper and lower respiratory specimens during the acute phase of infection. Negative results do not preclude SARS-CoV-2 infection, do not rule out co-infections with other pathogens, and should not be used as the sole basis for treatment or other patient management decisions. Negative results must be combined with clinical observations, patient history, and epidemiological information. The expected result is Negative.  Fact Sheet for Patients: SugarRoll.be  Fact Sheet for Healthcare Providers: https://www.woods-mathews.com/  This test is not yet approved or cleared by the [place] FDA and  has been authorized for detection  and/or diagnosis of SARS-CoV-2 by FDA under an Emergency Use Authorization (EUA). This EUA will remain  in effect (meaning this test can be used) for the duration of the COVID-19 declaration under Se ction 564(b)(1) of the Act, 21 U.S.C. section 360bbb-3(b)(1), unless the authorization is terminated or revoked sooner.  Performed at [facility], [place] [address], [place], [place] [address]     Labs: CBC: Recent Labs  Lab 03/05/22 0603  WBC 14.9*  NEUTROABS 10.4*  HGB 12.7*  HCT 40.0  MCV 85.5  PLT 937   Basic Metabolic Panel: Recent Labs  Lab 03/05/22 0603  NA 134*  K 4.5  CL 97*  CO2 25  GLUCOSE 331*  BUN 17  CREATININE 1.09  CALCIUM 9.5   Liver Function Tests: Recent Labs  Lab 03/05/22 0603  AST 28  ALT 26  ALKPHOS 70  BILITOT 1.0  PROT 8.3*  ALBUMIN 3.9   CBG: Recent Labs  Lab 03/05/22 1227 03/05/22 1742 03/05/22 2111 03/06/22 0759  GLUCAP 259* 213* 288* 283*    Discharge time spent: greater than 30 minutes.  Signed: Fritzi Mandes, MD [facility] 03/06/2022

## 2022-03-06 NOTE — Progress Notes (Addendum)
Inpatient Diabetes Program Recommendations  AACE/ADA: New Consensus Statement on Inpatient Glycemic Control  Target Ranges:  Prepandial:   less than 140 mg/dL      Peak postprandial:   less than 180 mg/dL (1-2 hours)      Critically ill patients:  140 - 180 mg/dL    Latest Reference Range & Units 03/05/22 12:27 03/05/22 17:42 03/05/22 21:11 03/06/22 07:59  Glucose-Capillary 70 - 99 mg/dL 259 (H) 213 (H) 288 (H) 283 (H)    Latest Reference Range & Units 08/23/21 04:03 03/05/22 06:03  Hemoglobin A1C 4.8 - 5.6 % 8.3 (H) 9.8 (H)   Review of Glycemic Control  Diabetes history: DM2 Outpatient Diabetes medications: Basaglar 32 units daily,  Lyumjev 22 units with breakfast/16 units with lunch/16 units with supper, Farxiga 10 QAM, Trulicity 3 mg Qweek Current orders for Inpatient glycemic control: Semglee 32 units QHS, Novolog 0-9 units TID with meals, Novolog 0-5 units QHS, Farxiga 10 mg daily   Inpatient Diabetes Program Recommendations:     Insulin: Please consider ordering Novolog 5 units TID with meals for meal coverage if patient eats at least 50% of meals.  HbgA1C:  A1C 9.8% on 03/05/22 indicating an average glucose of 235 mg/dl over the past 2-3 months.   NOTE:  Per chart review patient sees Dr. Kelton Pillar; last seen 09/04/21 - at visit Farxiga increased from 5 to 10 mg, Trulicity increased from 1.5 to 3 mg Qweek and given Humalog correction scale 130/25 (Target glucose 130 mg/dl - 1 unit per 25 mg/dl above target). Also noted patient has [employer] medication assistance. Addendum 03/06/22'@10'$ :35-Spoke with patient about diabetes and home regimen for diabetes control. Patient reports currently taking Basaglar 32 units daily,  Lyumjev 22 units with breakfast/16 units with lunch/16 units with supper, Farxiga 10 QAM, and Trulicity 3 mg Qweek as an outpatient for diabetes control. Patient reports taking DM medications as prescribed and notes that he gets medications through the mail and he runs out  of medications at time prior to getting refills via the mail.  Patient states that he currently has all DM medications but is running low on some of them. Encouraged patient to reach out to Endocrinologist to see if he can get samples if needed so he does not run out completely before he gets refills.   Patient reports using FreeStyle Libre2 CGM for glucose monitoring and notes that he had just put on a sensor the day prior to coming to the hospital and they made him take it off prior to radiology scan.  He notes that the same thing happened the last time he came to the hospital. Encouraged patient to call [phone] number for [employer] nutritionist of [employer]) and let them know it was removed at hospital to see if they would send him a replacement. Provided patient with 2 sample FreeStyle Libre2 CGM sensors.  Discussed A1C results (9.8% on 03/05/22) and explained that current A1C indicates an average glucose of 235 mg/dl over the past 2-3 months. Patient states that his last A1C was much higher.  Discussed glucose and A1C goals. Discussed importance of checking CBGs and maintaining good CBG control to prevent long-term and short-term complications. Encouraged patient to follow up with Endocrinologist regarding DM management.  Patient verbalized understanding of information discussed and reports no further questions at this time related to diabetes.   Thanks, Barnie Alderman, RN, MSN, [facility] Coordinator Inpatient Diabetes Program [phone] (Team Pager from 8am to [place])

## 2022-03-06 NOTE — Progress Notes (Signed)
Discharge instructions reviewed with patient at the bedside. PIV removed no complications. Questions and concerns were addressed. Patient and wife say they have no more concerns at this time. Patient Edward Schneider home with wife.

## 2022-03-06 NOTE — Discharge Instructions (Signed)
Take bowel regimen for constipation as discussed

## 2022-03-11 ENCOUNTER — Encounter: Payer: Self-pay | Admitting: Nurse Practitioner

## 2022-03-11 ENCOUNTER — Ambulatory Visit (INDEPENDENT_AMBULATORY_CARE_PROVIDER_SITE_OTHER): Payer: Medicare Other | Admitting: Nurse Practitioner

## 2022-03-11 VITALS — BP 112/77 | HR 91 | Temp 97.7°F | Ht 68.0 in | Wt 249.4 lb

## 2022-03-11 DIAGNOSIS — E1165 Type 2 diabetes mellitus with hyperglycemia: Secondary | ICD-10-CM | POA: Diagnosis not present

## 2022-03-11 DIAGNOSIS — K8689 Other specified diseases of pancreas: Secondary | ICD-10-CM

## 2022-03-11 DIAGNOSIS — K8500 Idiopathic acute pancreatitis without necrosis or infection: Secondary | ICD-10-CM | POA: Diagnosis not present

## 2022-03-11 DIAGNOSIS — K769 Liver disease, unspecified: Secondary | ICD-10-CM | POA: Insufficient documentation

## 2022-03-11 DIAGNOSIS — J432 Centrilobular emphysema: Secondary | ICD-10-CM | POA: Diagnosis not present

## 2022-03-11 MED ORDER — ONDANSETRON HCL 4 MG PO TABS: 4.0000 mg | ORAL_TABLET | Freq: Three times a day (TID) | ORAL | 3 refills | Status: DC | PRN

## 2022-03-11 NOTE — Assessment & Plan Note (Addendum)
Acute diagnosed in hospital, labs stable but imaging showed possible acute pancreatitis.  At this time continues to have LUQ discomfort and nausea.  Will send in Zofran for nausea.  Continue collaboration with GI -- he is scheduled to follow-up with them.  Will obtain MRI abdomen as was recommended on CT due to possible liver lesion noted.  Labs today: CMP, CBC, Amylase, Lipase.  Reached out to endo via messaging to update and see if Trulicity should be maintained.

## 2022-03-11 NOTE — Assessment & Plan Note (Signed)
Chronic, ongoing.  Continue current inhaler regimen, Stiolto through assistance program received and Albuterol.  Continue collaboration with pulmonary. He will follow-up with them upcoming about his O2 needs.

## 2022-03-11 NOTE — Patient Instructions (Signed)
Pancreatitis Eating Plan Pancreatitis is when your pancreas gets irritated and swollen (inflamed). The pancreas is a small gland behind your stomach. It helps your body digest food and regulate your blood sugar. Pancreatitis can affect how your body digests food, especially foods with fat. You may also have other symptoms such as pain in your abdomen (abdominal pain) or nausea. When you have pancreatitis, following a low-fat eating plan may help you manage symptoms and recover faster. Work with your health care provider or a dietitian to create an eating plan that is right for you. What are tips for following this plan? Reading food labels Use the information on food labels to help keep track of how much fat you eat: Check the serving size. Look for the amount of total fat in grams (g) in one serving. Low-fat foods have 3 g of fat or less per serving. Fat-free foods have 0.5 g of fat or less per serving. Keep track of how much fat you eat based on how many servings you eat. For example, if you eat two servings, the amount of fat you eat will be twice what is listed on the label. Shopping  Buy low-fat or nonfat foods, such as: Fresh, frozen, or canned fruits and vegetables. Grains, including pasta, bread, and rice. Lean meat, poultry, fish, and other protein foods. Low-fat or nonfat dairy. Avoid buying bakery products and other sweets made with whole milk, butter, and eggs. Avoid buying snack foods with added fat, such as anything with butter or cheese flavoring. Cooking Remove skin from poultry, and remove extra fat from meat. Limit the amount of fat and oil you use to 6 tsp (30 mL) or less per day. Cook using low-fat methods, such as boiling, broiling, grilling, steaming, or baking. Use spray oil to cook. Add fat-free chicken broth to add flavor and moisture. Avoid adding cream to thicken soups or sauces. Use other thickeners such as corn starch or tomato paste. Meal planning  Eat a  low-fat diet as told by your dietitian. For most people, this means having no more than 55-65 g of fat each day. Eat small, frequent meals throughout the day. For example, you may have 5 or 6 small meals instead of 3 large meals. Drink enough fluid to keep your urine pale yellow. Do not drink alcohol. Talk to your health care provider if you need help stopping. Limit how much caffeine you have, including black coffee, black and green tea, soft drinks with caffeine, and energy drinks. Plan to include a variety of foods in your diet. Include fruits, vegetables, whole grains and lean proteins, and low-fat or nonfat dairy. You need a balanced diet for good overall health. General information Let your health care provider or dietitian know if you have unplanned weight loss on this eating plan. You may be told to follow a clear liquid or soft food diet when symptoms come back, which is called a flare. Talk with your health care provider about how to manage your diet during symptoms of a flare. Take any vitamins or supplements as told by your health care provider. You may need to take: Extra vitamins that dissolve in fat (are fat soluble), such as vitamins A, D, E, and K. Nutritional supplements. Work with a dietitian, especially if you have other conditions such as obesity, osteoporosis, or diabetes mellitus. Some people need extra treatments, such as: Pills or capsules to replace enzymes (oral pancreatic enzyme replacement therapy). Feedings through a tube in the stomach or intestine (  enteral feedings). What foods should I avoid? Fruits Fried fruits. Fruits served with butter or cream. Vegetables Fried vegetables. Vegetables cooked with butter, cheese, or cream. Grains Biscuits, waffles, donuts, pastries, and croissants. Pies and cookies. Butter-flavored popcorn. Regular crackers. Meats and other proteins Fatty cuts of meat. Poultry with skin. Organ meats. Precooked or cured meat, such as sausages  or meat loaves. Whole eggs. Nuts and nut butters. Dairy Whole and 2% milk. Whole milk yogurt. Whole milk ice cream. Cream and half-and-half. Cheese, such as cream cheese. Sour cream. Beverages Wine, beer, and liquor. The items listed above may not be a complete list of foods and beverages you should avoid. Contact a dietitian for more information. Summary Pancreatitis can affect how your body digests food, especially foods with fat. When you have pancreatitis, it is recommended that you follow a low-fat eating plan to help you recover faster and manage symptoms. Do not drink alcohol. Limit the amount of caffeine you have, and drink enough fluid to keep your urine pale yellow. This information is not intended to replace advice given to you by your health care provider. Make sure you discuss any questions you have with your health care provider. Document Revised: 09/24/2021 Document Reviewed: 09/24/2021 Elsevier Patient Education  2023 [employer] Inc.  

## 2022-03-11 NOTE — Progress Notes (Signed)
BP 112/77   Pulse 91   Temp 97.7 F (36.5 C) (Oral)   Ht 5' 8" (1.727 m)   Wt 249 lb 6.4 oz (113.1 kg)   SpO2 92%   BMI 37.92 kg/m    Subjective:    Patient ID: [name], male    DOB: 10-16-1956, 66 y.o.   MRN: [id number]  HPI: Kendrick Haapala. is a 66 y.o. male  Chief Complaint  Patient presents with   Oxygen Request    Patient states he is needing Oxygen. Patient states his provider is out for surgery and went he goes to the office for an appointment. He will have to see a PA and he would like to discuss with provider at today's visit.    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 Patient states that they placed him on a gluten free diet. Patient states he has not had a bowel movement in 7 days. Patient states his stools were black and grape-like and it was like that for about 4-5 days and then turned to diarrhea. Patient states he thinks his bowel movements are back to normal now with medication.    Diabetes    Patient is scheduled in June for his recent Diabetic Eye Exam.    Transition of [facility] Follow up.  Admitted to hospital on 03/05/22 for abdominal pain and was found to have acute pancreatitis per review of notes.  Discharged on 03/06/22 and is following a gluten free and bland diet at this time.  He has seen both GI and general surgery in past for abdominal pain.  Currently followed by endocrinology for his diabetes, with las visit on 09/04/21.  Recent A1c in hospital was 03/05/22 and 9.8% -- sees endocrinology in 2-3 weeks -- he is on Trulicity.  He continues to have some abdominal discomfort radiating from back to under LUQ of abdomen.  Just took Percocet, current pain is 4/10 -- this helps relieve pain.  When not taking pain medication it is 40-50 out of ten.  Working on diet changes to help prevent worsening.  Pain is constant and gets nauseous with this. Recent  pancreas labs were stable.  Noted inflammatory changes of pancreas on CT + severe hepatic steatosis, plus there was a hypovascular lesion to left lobe of liver that was indeterminate on recent CT + mildly enlarged hepatoduodenal ligament lymph node -- recommended f/u with abdominal MRI.  Does follow with GI at [facility] and sees them next month.  Informs provider today that he is in need of oxygen, he is followed by pulmonary and discussed with him he would need visit for testing with them.  Last visit was 02/23/22 for his OSA and COPD.  Is scheduled with them next month to see Dr. Ander Slade.  Getting new CPAP at beginning of next week.  "Hospital Course: Jauan Wohl. is a 66 y.o. male with medical history significant of aortic atherosclerosis, C diff diarrhea, collagenase colitis, CAD, liver cancer, chronic abdominal pain, hyperlipidemia hypertension and type II diabetes comes emergency room with three day of abdominal pain generalized [name] in the epigastric area without nausea and vomiting. Patient reports he has chronic pain but has been much severe. He was moving some furniture three days ago and thereafter started having pain.   In the ER CT scan shows. Inflammatory changes surrounding the  pancreas concerning for an acute pancreatitis. No evidence of pancreatic necrosis and no pancreatic pseudocyst noted at this time. 2. Severe hepatic steatosis. There is a suggestion of a hypovascular lesion in the left lobe of the liver between segments 4A and 4B, but this is indeterminate on today's examination. Additionally, there is a mildly enlarged hepatoduodenal ligament lymph node, which is nonspecific. For further evaluation of both of these findings, follow-up nonemergent abdominal MRI with and without IV gadolinium is recommended in the near future to provide definitive characterization. 3. Aortic atherosclerosis.   Lipase 45. Patient continues to have abdominal pain no vomiting    Acute on chronic abdominal pain--suspect Constipation contributing given left sided pain mild pancreatitis (on C T)with normal lipase--clinicaly does not have pancreatitis -- clear liquid diet--tolerating soft diet --- G.I. consultation with Dr. Thayer Headings to offer -- ultrasound abdomen neg for GB issue -- patient denies any history of alcohol --Pt reports chronic constipation ann at times does not have BM for 9 days!! --discussed bowel regimen and prescribed it also. Pt tolerated po diet well   [name] continue PPI   hypertension will resume home meds   type II diabetes with atherosclerosis and hyperlipidemia -- insulin long-acting with sliding scale -- statins   history of hepatic steatosis and liver cancer -- continue to monitor   Pt will discharge to home today. Wife in the room."  Hospital/Facility: [facility] D/C Physician: Dr. Posey Pronto D/C Date: 03/06/22  Records Requested: 03/11/22 Records Received: 03/11/22 Records Reviewed: 03/11/22  Diagnoses on Discharge: Acute pancreatitis  Date of interactive Contact within 48 hours of discharge:  Contact was through:  none  Date of 7 day or 14 day face-to-face visit:    within 7 days  Outpatient Encounter Medications as of 03/11/2022  Medication Sig Note   acetaminophen (TYLENOL) 650 MG CR tablet Take 1,300 mg by mouth every 8 (eight) hours. 03/05/2022: PRN "body aches"   albuterol (VENTOLIN HFA) 108 (90 Base) MCG/ACT inhaler Inhale 2 puffs into the lungs every 6 (six) hours as needed for wheezing or shortness of breath. 03/05/2022: Takes BID   Alcohol Swabs (PHARMACIST CHOICE ALCOHOL) PADS SMARTSIG:1 Each Topical 4 Times Daily    Ascorbic Acid (VITAMIN C) 1000 MG tablet Take 1,000 mg by mouth daily.     aspirin EC 81 MG tablet Take 81 mg by mouth daily. Swallow whole.    atorvastatin (LIPITOR) 80 MG tablet TAKE 1 TABLET BY MOUTH AT BEDTIME    benazepril (LOTENSIN) 10 MG tablet Take 1 tablet by mouth once daily    bisacodyl (DULCOLAX) 5  MG EC tablet Take 2 tablets (10 mg total) by mouth daily in the afternoon.    Budeson-Glycopyrrol-Formoterol (BREZTRI AEROSPHERE) 160-9-4.8 MCG/ACT AERO Inhale 2 puffs into the lungs in the morning and at bedtime.    calcium carbonate (OS-CAL) 600 MG TABS tablet Take 600 mg by mouth daily.     cetirizine (ZYRTEC) 10 MG tablet TAKE 1 TABLET BY MOUTH EVERY DAY    Cholecalciferol (VITAMIN D3) 25 MCG (1000 UT) CHEW Chew 1,000 Units by mouth daily.     CINNAMON PO Take 1,000 mg by mouth 2 (two) times daily.     clobetasol cream (TEMOVATE) 9.50 % Apply 1 application topically 2 (two) times daily. 03/05/2022: Applies to "ant bites" on arms and chest   COMFORT EZ PEN NEEDLES 32G X 4 MM MISC     Continuous Blood Gluc Sensor (FREESTYLE LIBRE 2 SENSOR) MISC Use as instructed to check blood sugar  daily    cyclobenzaprine (FLEXERIL) 10 MG tablet Take 1 tablet (10 mg total) by mouth 3 (three) times daily as needed for muscle spasms.    dapagliflozin propanediol (FARXIGA) 10 MG TABS tablet Take 1 tablet (10 mg total) by mouth daily before breakfast.    diclofenac Sodium (VOLTAREN) 1 % GEL Apply 2 g topically 4 (four) times daily as needed (pain).     diphenhydrAMINE (BENADRYL) 25 mg capsule Take 50 mg by mouth in the morning, at noon, and at bedtime. 03/05/2022: Takes 2 tablets TID for as long as he can remember. States he is allergic to everything   doxepin (SINEQUAN) 25 MG capsule Take 50 mg by mouth at bedtime.     Dulaglutide (TRULICITY) 3 SN/0.5LZ SOPN Inject 3 mg as directed once a week.    DULoxetine (CYMBALTA) 60 MG capsule Take 1 capsule (60 mg total) by mouth at bedtime.    EPINEPHrine 0.3 mg/0.3 mL IJ SOAJ injection Inject 0.3 mg into the muscle as needed for anaphylaxis.    ezetimibe (ZETIA) 10 MG tablet Take 1 tablet by mouth once daily    famotidine (PEPCID) 20 MG tablet TAKE 1 TABLET BY MOUTH TWICE DAILY    Ferrous Gluconate-C-Folic Acid (IRON-C PO) Take 30 mg by mouth daily.    Ginger, Zingiber  officinalis, (GINGER PO) Take 1 tablet by mouth daily.    Ginseng 100 MG CAPS Take 100 mg by mouth daily.     Insulin Glargine (BASAGLAR KWIKPEN) 100 UNIT/ML Inject 32 Units into the skin daily.    Insulin Lispro-aabc (LYUMJEV KWIKPEN) 100 UNIT/ML KwikPen 22 units with Breakfast and continue 16 units with Lunch and 16 units with supper    isosorbide mononitrate (IMDUR) 60 MG 24 hr tablet Take 1 tablet by mouth once daily    lidocaine (XYLOCAINE) 2 % solution Use as directed 15 mLs in the mouth or throat as needed for mouth pain.    mometasone (ELOCON) 0.1 % ointment Apply topically 2 (two) times daily as needed. 03/05/2022: Applies to "ant bites"   Multiple Vitamins-Minerals (CENTRUM SILVER 50+MEN) TABS Take 1 tablet by mouth daily.    naproxen sodium (ALEVE) 220 MG tablet Take 440 mg by mouth daily as needed (body aches).    NARCAN 4 MG/0.1ML LIQD nasal spray kit Place 0.4 mg into the nose as needed (opioid overdose).     nitroGLYCERIN (NITROSTAT) 0.4 MG SL tablet Place 0.4 mg under the tongue every 5 (five) minutes as needed for chest pain.     Omega-3 1000 MG CAPS Take 1,000 mg by mouth daily.     ondansetron (ZOFRAN) 4 MG tablet Take 1 tablet (4 mg total) by mouth every 8 (eight) hours as needed for nausea or vomiting.    oxyCODONE (ROXICODONE) 15 MG immediate release tablet Take 15 mg by mouth every 4 (four) hours as needed for pain.    pantoprazole (PROTONIX) 40 MG tablet Take 1 tablet (40 mg total) by mouth 2 (two) times daily.    Plecanatide (TRULANCE) 3 MG TABS Take 1 tablet by mouth daily.    polyethylene glycol (MIRALAX / GLYCOLAX) 17 g packet Take 17 g by mouth 2 (two) times daily.    pregabalin (LYRICA) 100 MG capsule Take 1 capsule (100 mg total) by mouth 2 (two) times daily.    senna-docusate (SENOKOT-S) 8.6-50 MG tablet Take 1 tablet by mouth 2 (two) times daily.    sucralfate (CARAFATE) 1 g tablet TAKE 1 TABLET BY MOUTH 4 TIMES  DAILY WITH MEALS AND AT BEDTIME    traMADol (ULTRAM)  50 MG tablet Take 1 tablet (50 mg total) by mouth every 6 (six) hours as needed.    traZODone (DESYREL) 100 MG tablet TAKE 1 TABLET BY MOUTH AT BEDTIME AS NEEDED FOR SLEEP    vitamin A 10000 UNIT capsule Take 10,000 Units by mouth daily.    vitamin B-12 (CYANOCOBALAMIN) 1000 MCG tablet Take 1,000 mcg by mouth daily.    Vitamin E 400 units TABS Take 400 Units by mouth daily.     No facility-administered encounter medications on file as of 03/11/2022.    Diagnostic Tests Reviewed/Disposition: reviewed within chart  Consults: GI consulted  Discharge Instructions: Follow-up with PCP  Disease/illness Education: Reviewed with patient and his wife  Home Health/Community Services Discussions/Referrals: none  Establishment or re-establishment of referral orders for community resources: none  Discussion with other health care providers: reviewed recent notes  Assessment and Support of treatment regimen adherence: Reviewed with patient and his wife  Appointments Coordinated with: Reviewed with patient and his wife  Education for self-management, independent living, and ADLs:  Reviewed with patient and his wife  Relevant past medical, surgical, family and social history reviewed and updated as indicated. Interim medical history since our last visit reviewed. Allergies and medications reviewed and updated.  Review of Systems  Constitutional: Negative.   Respiratory:  Negative for apnea, cough, chest tightness, shortness of breath and wheezing.   Cardiovascular:  Negative for chest pain, palpitations and leg swelling.  Gastrointestinal:  Positive for abdominal pain and nausea. Negative for abdominal distention, constipation, diarrhea and vomiting.  Endocrine: Negative.   Neurological: Negative.   Psychiatric/Behavioral: Negative.     Per HPI unless specifically indicated above     Objective:    BP 112/77   Pulse 91   Temp 97.7 F (36.5 C) (Oral)   Ht 5' 8" (1.727 m)   Wt 249 lb  6.4 oz (113.1 kg)   SpO2 92%   BMI 37.92 kg/m   Wt Readings from Last 3 Encounters:  03/11/22 249 lb 6.4 oz (113.1 kg)  03/05/22 250 lb (113.4 kg)  02/25/22 252 lb 9.6 oz (114.6 kg)    Physical Exam Vitals and nursing note reviewed.  Constitutional:      General: He is awake. He is not in acute distress.    Appearance: He is well-developed and well-groomed. He is obese. He is not ill-appearing or toxic-appearing.  HENT:     Head: Normocephalic and atraumatic.     Right Ear: Hearing normal. No drainage.     Left Ear: Hearing normal. No drainage.  Eyes:     General: Lids are normal.        Right eye: No discharge.        Left eye: No discharge.     Conjunctiva/sclera: Conjunctivae normal.     Pupils: Pupils are equal, round, and reactive to light.  Neck:     Thyroid: No thyromegaly.     Vascular: No carotid bruit.     Trachea: Trachea normal.  Cardiovascular:     Rate and Rhythm: Normal rate and regular rhythm.     Heart sounds: Normal heart sounds, S1 normal and S2 normal. No murmur heard.   No gallop.  Pulmonary:     Effort: Pulmonary effort is normal. No accessory muscle usage or respiratory distress.     Breath sounds: Normal breath sounds.  Abdominal:     General: Bowel sounds are normal.  There is no distension.     Palpations: Abdomen is soft.     Tenderness: There is abdominal tenderness in the left upper quadrant. There is no right CVA tenderness, left CVA tenderness, guarding or rebound. Negative signs include Murphy's sign.     Hernia: A hernia is present. Hernia is present in the umbilical area. There is no hernia in the ventral area.     Comments: Abdomen very rotund at baseline.  Discomfort with palpation to RUQ noted on exam.  Musculoskeletal:        General: Normal range of motion.     Cervical back: Normal range of motion and neck supple.     Right lower leg: No edema.     Left lower leg: No edema.  Skin:    General: Skin is warm and dry.     Capillary  Refill: Capillary refill takes less than 2 seconds.     Comments: Scattered pale bruising to upper extremities and abrasions -- baseline.    Neurological:     Mental Status: He is alert and oriented to person, place, and time.     Motor: Motor function is intact.     Coordination: Coordination is intact.     Gait: Gait is intact.     Deep Tendon Reflexes: Reflexes are normal and symmetric.  Psychiatric:        Attention and Perception: Attention normal.        Mood and Affect: Mood normal.        Behavior: Behavior normal. Behavior is cooperative.        Thought Content: Thought content normal.    Results for orders placed or performed during the hospital encounter of 03/05/22  CBC with Differential  Result Value Ref Range   WBC 14.9 (H) 4.0 - 10.5 K/uL   RBC 4.68 4.22 - 5.81 MIL/uL   Hemoglobin 12.7 (L) 13.0 - 17.0 g/dL   HCT 40.0 39.0 - 52.0 %   MCV 85.5 80.0 - 100.0 fL   MCH 27.1 26.0 - 34.0 pg   MCHC 31.8 30.0 - 36.0 g/dL   RDW 15.4 11.5 - 15.5 %   Platelets 219 150 - 400 K/uL   nRBC 0.0 0.0 - 0.2 %   Neutrophils Relative % 69 %   Neutro Abs 10.4 (H) 1.7 - 7.7 K/uL   Lymphocytes Relative 15 %   Lymphs Abs 2.2 0.7 - 4.0 K/uL   Monocytes Relative 12 %   Monocytes Absolute 1.7 (H) 0.1 - 1.0 K/uL   Eosinophils Relative 2 %   Eosinophils Absolute 0.4 0.0 - 0.5 K/uL   Basophils Relative 1 %   Basophils Absolute 0.1 0.0 - 0.1 K/uL   Immature Granulocytes 1 %   Abs Immature Granulocytes 0.09 (H) 0.00 - 0.07 K/uL  Comprehensive metabolic panel  Result Value Ref Range   Sodium 134 (L) 135 - 145 mmol/L   Potassium 4.5 3.5 - 5.1 mmol/L   Chloride 97 (L) 98 - 111 mmol/L   CO2 25 22 - 32 mmol/L   Glucose, Bld 331 (H) 70 - 99 mg/dL   BUN 17 8 - 23 mg/dL   Creatinine, Ser 1.09 0.61 - 1.24 mg/dL   Calcium 9.5 8.9 - 10.3 mg/dL   Total Protein 8.3 (H) 6.5 - 8.1 g/dL   Albumin 3.9 3.5 - 5.0 g/dL   AST 28 15 - 41 U/L   ALT 26 0 - 44 U/L   Alkaline Phosphatase 70 38 - 126  U/L    Total Bilirubin 1.0 0.3 - 1.2 mg/dL   GFR, Estimated >60 >60 mL/min   Anion gap 12 5 - 15  Lipase, blood  Result Value Ref Range   Lipase 45 11 - 51 U/L  Hemoglobin A1c  Result Value Ref Range   Hgb A1c MFr Bld 9.8 (H) 4.8 - 5.6 %   Mean Plasma Glucose 234.56 mg/dL  Triglycerides  Result Value Ref Range   Triglycerides 144 <150 mg/dL  HIV Antibody (routine testing w rflx)  Result Value Ref Range   HIV Screen 4th Generation wRfx Non Reactive Non Reactive  Glucose, capillary  Result Value Ref Range   Glucose-Capillary 213 (H) 70 - 99 mg/dL  Glucose, capillary  Result Value Ref Range   Glucose-Capillary 288 (H) 70 - 99 mg/dL  Glucose, capillary  Result Value Ref Range   Glucose-Capillary 283 (H) 70 - 99 mg/dL   Comment 1 Notify RN    Comment 2 Document in Chart   CBG monitoring, ED  Result Value Ref Range   Glucose-Capillary 259 (H) 70 - 99 mg/dL  Troponin I (High Sensitivity)  Result Value Ref Range   Troponin I (High Sensitivity) 6 <18 ng/L      Assessment & Plan:   Problem List Items Addressed This Visit       Respiratory   Emphysema lung (HCC) - Primary    Chronic, ongoing.  Continue current inhaler regimen, Stiolto through assistance program received and Albuterol.  Continue collaboration with pulmonary. He will follow-up with them upcoming about his O2 needs.         Digestive   Liver disease    Noted on CT severe hepatic steatosis and possible liver lesion with recommendation for MRI abdomen to further assess. Order placed and discussed with patient.  Continue to collaborate with GI.         Relevant Orders   MR Abdomen W Wo Contrast   Pancreatic insufficiency    Was noted on past chronic disease list, check Amylase and Lipase today.       Relevant Orders   CBC with Differential/Platelet   Comprehensive metabolic panel   Lipase   Amylase   TSH   Pancreatitis    Acute diagnosed in hospital, labs stable but imaging showed possible acute  pancreatitis.  At this time continues to have LUQ discomfort and nausea.  Will send in Zofran for nausea.  Continue collaboration with GI -- he is scheduled to follow-up with them.  Will obtain MRI abdomen as was recommended on CT due to possible liver lesion noted.  Labs today: CMP, CBC, Amylase, Lipase.  Reached out to endo via messaging to update and see if Trulicity should be maintained.       Relevant Orders   CBC with Differential/Platelet   Comprehensive metabolic panel   Lipase   Amylase     Follow up plan: Return for as scheduled 04/17/22.

## 2022-03-11 NOTE — Assessment & Plan Note (Signed)
Noted on CT severe hepatic steatosis and possible liver lesion with recommendation for MRI abdomen to further assess. Order placed and discussed with patient.  Continue to collaborate with GI.

## 2022-03-11 NOTE — Assessment & Plan Note (Signed)
Was noted on past chronic disease list, check Amylase and Lipase today. 

## 2022-03-12 DIAGNOSIS — E1165 Type 2 diabetes mellitus with hyperglycemia: Secondary | ICD-10-CM | POA: Diagnosis not present

## 2022-03-12 DIAGNOSIS — E1142 Type 2 diabetes mellitus with diabetic polyneuropathy: Secondary | ICD-10-CM | POA: Diagnosis not present

## 2022-03-12 LAB — CBC WITH DIFFERENTIAL/PLATELET
Basophils Absolute: 0.1 10*3/uL (ref 0.0–0.2)
Basos: 1 %
EOS (ABSOLUTE): 0.5 10*3/uL — ABNORMAL HIGH (ref 0.0–0.4)
Eos: 5 %
Hematocrit: 38.8 % (ref 37.5–51.0)
Hemoglobin: 12.6 g/dL — ABNORMAL LOW (ref 13.0–17.7)
Immature Grans (Abs): 0.1 10*3/uL (ref 0.0–0.1)
Immature Granulocytes: 1 %
Lymphocytes Absolute: 2.4 10*3/uL (ref 0.7–3.1)
Lymphs: 24 %
MCH: 27.3 pg (ref 26.6–33.0)
MCHC: 32.5 g/dL (ref 31.5–35.7)
MCV: 84 fL (ref 79–97)
Monocytes Absolute: 0.8 10*3/uL (ref 0.1–0.9)
Monocytes: 8 %
Neutrophils Absolute: 6 10*3/uL (ref 1.4–7.0)
Neutrophils: 61 %
Platelets: 306 10*3/uL (ref 150–450)
RBC: 4.61 x10E6/uL (ref 4.14–5.80)
RDW: 14.6 % (ref 11.6–15.4)
WBC: 9.9 10*3/uL (ref 3.4–10.8)

## 2022-03-12 LAB — COMPREHENSIVE METABOLIC PANEL
ALT: 24 IU/L (ref 0–44)
AST: 33 IU/L (ref 0–40)
Albumin/Globulin Ratio: 1.4 (ref 1.2–2.2)
Albumin: 4 g/dL (ref 3.8–4.8)
Alkaline Phosphatase: 89 IU/L (ref 44–121)
BUN/Creatinine Ratio: 16 (ref 10–24)
BUN: 15 mg/dL (ref 8–27)
Bilirubin Total: 0.3 mg/dL (ref 0.0–1.2)
CO2: 20 mmol/L (ref 20–29)
Calcium: 9.5 mg/dL (ref 8.6–10.2)
Chloride: 100 mmol/L (ref 96–106)
Creatinine, Ser: 0.92 mg/dL (ref 0.76–1.27)
Globulin, Total: 2.9 g/dL (ref 1.5–4.5)
Glucose: 213 mg/dL — ABNORMAL HIGH (ref 70–99)
Potassium: 5 mmol/L (ref 3.5–5.2)
Sodium: 135 mmol/L (ref 134–144)
Total Protein: 6.9 g/dL (ref 6.0–8.5)
eGFR: 93 mL/min/{1.73_m2} (ref >59)

## 2022-03-12 LAB — TSH: TSH: 2.04 u[IU]/mL (ref 0.450–4.500)

## 2022-03-12 LAB — LIPASE: Lipase: 91 U/L — ABNORMAL HIGH (ref 13–78)

## 2022-03-12 LAB — AMYLASE: Amylase: 58 U/L (ref 31–110)

## 2022-03-12 NOTE — Progress Notes (Signed)
Good morning crew, please let Nasier know his labs have returned: - Pancreas labs show mild elevation in one, Lipase, I recommend continued focus on diet as he is now to keep pancreas happy. - CBC shows stable levels --- no infection. - Kidney function, creatinine and eGFR, remains normal, as is liver function, AST and ALT.   - Thyroid level normal.  Any questions?  Please ensure to maintain follow-up with GI provider. Keep being amazing!!  Thank you for allowing me to participate in your care.  I appreciate you. Kindest regards, Charla Criscione

## 2022-03-13 NOTE — Addendum Note (Signed)
Addended by: Marnee Guarneri T on: 03/13/2022 05:07 PM   Modules accepted: Orders

## 2022-03-18 ENCOUNTER — Ambulatory Visit: Payer: Self-pay

## 2022-03-18 ENCOUNTER — Telehealth: Payer: Self-pay

## 2022-03-18 ENCOUNTER — Encounter: Payer: Self-pay | Admitting: Nurse Practitioner

## 2022-03-18 ENCOUNTER — Ambulatory Visit (INDEPENDENT_AMBULATORY_CARE_PROVIDER_SITE_OTHER): Payer: Medicare Other | Admitting: Nurse Practitioner

## 2022-03-18 DIAGNOSIS — Z8719 Personal history of other diseases of the digestive system: Secondary | ICD-10-CM | POA: Insufficient documentation

## 2022-03-18 DIAGNOSIS — K5901 Slow transit constipation: Secondary | ICD-10-CM

## 2022-03-18 MED ORDER — SENNOSIDES-DOCUSATE SODIUM 8.6-50 MG PO TABS: 1.0000 | ORAL_TABLET | Freq: Two times a day (BID) | ORAL | 4 refills | Status: DC

## 2022-03-18 MED ORDER — POLYETHYLENE GLYCOL 3350 17 G PO PACK: 17.0000 g | PACK | Freq: Two times a day (BID) | ORAL | 12 refills | Status: AC

## 2022-03-18 NOTE — Patient Instructions (Signed)

## 2022-03-18 NOTE — Progress Notes (Signed)
BP 102/66   Pulse 81   Temp 97.8 F (36.6 C) (Oral)   Ht _0  (1.727 m)   Wt 249 lb 12.8 oz (113.3 kg)   SpO2 95%   BMI 37.98 kg/m    Subjective:    Patient ID: Edward Schneider., male    DOB: 05/07/1956, 66 y.o.   MRN: [id number]  HPI: Broderic Bara. is a 66 y.o. male  Chief Complaint  Patient presents with   Constipation    Patient is here for Constipation. Patient states he has not had a bowel movement since Sunday. Patient states he has not taken any medication due to him taking a lot of medications currently and he wanted to talk to the provider. 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 Patient states he has stopped taking the medication he was prescribed while in the hospital and he has not tried since.    CONSTIPATION He reports no bowel movement since Sunday at this time -- has underlying constipation over past 3 months -- continues on Tramadol and Oxycodone daily for pain.  Has changed diet recently to gluten free.  Was taking Miralax and Senna BID at hospital, which he reports helped a lot.  Is straining with BM.   Duration:months Onset: gradual Alleviating factors: Miralax and Senna Aggravating factors: unknown Status: fluctuating Treatments attempted: as above Fever: no Nausea: no Vomiting: no Weight loss: no Decreased appetite: no Diarrhea: no Constipation: yes Blood in stool: occasional on toilet paper from hemorrhoids Heartburn: no Jaundice: no Rash: no Dysuria/urinary frequency: no Hematuria: no History of sexually transmitted disease: no Recurrent NSAID use: no   Relevant past medical, surgical, family and social history reviewed and updated as indicated. Interim medical history since our last visit reviewed. Allergies and medications reviewed and updated.  Review of Systems  Constitutional: Negative.   Respiratory:  Negative for apnea, cough, chest tightness, shortness of breath and wheezing.    Cardiovascular:  Negative for chest pain, palpitations and leg swelling.  Gastrointestinal:  Positive for blood in stool (with wiping and straining on occasion only) and constipation. Negative for abdominal distention, abdominal pain, anal bleeding, diarrhea, nausea, rectal pain and vomiting.  Endocrine: Negative.   Neurological: Negative.   Psychiatric/Behavioral: Negative.     Per HPI unless specifically indicated above     Objective:    BP 102/66   Pulse 81   Temp 97.8 F (36.6 C) (Oral)   Ht _1  (1.727 m)   Wt 249 lb 12.8 oz (113.3 kg)   SpO2 95%   BMI 37.98 kg/m   Wt Readings from Last 3 Encounters:  03/18/22 249 lb 12.8 oz (113.3 kg)  03/11/22 249 lb 6.4 oz (113.1 kg)  03/05/22 250 lb (113.4 kg)    Physical Exam Vitals and nursing note reviewed.  Constitutional:      General: He is awake. He is not in acute distress.    Appearance: He is well-developed and well-groomed. He is obese. He is not ill-appearing or toxic-appearing.  HENT:     Head: Normocephalic and atraumatic.     Right Ear: Hearing normal. No drainage.     Left Ear: Hearing normal. No drainage.  Eyes:     General: Lids are normal.        Right eye: No discharge.        Left eye: No discharge.     Conjunctiva/sclera: Conjunctivae normal.  Pupils: Pupils are equal, round, and reactive to light.  Neck:     Thyroid: No thyromegaly.     Vascular: No carotid bruit.     Trachea: Trachea normal.  Cardiovascular:     Rate and Rhythm: Normal rate and regular rhythm.     Heart sounds: Normal heart sounds, S1 normal and S2 normal. No murmur heard.   No gallop.  Pulmonary:     Effort: Pulmonary effort is normal. No accessory muscle usage or respiratory distress.     Breath sounds: Normal breath sounds.  Abdominal:     General: Bowel sounds are normal. There is no distension.     Palpations: Abdomen is soft.     Tenderness: There is no abdominal tenderness. There is no right CVA tenderness, left CVA  tenderness, guarding or rebound. Negative signs include Murphy's sign.     Hernia: A hernia is present. Hernia is present in the umbilical area. There is no hernia in the ventral area.     Comments: Abdomen very rotund at baseline.    Musculoskeletal:        General: Normal range of motion.     Cervical back: Normal range of motion and neck supple.     Right lower leg: No edema.     Left lower leg: No edema.  Skin:    General: Skin is warm and dry.     Capillary Refill: Capillary refill takes less than 2 seconds.     Comments: Scattered pale bruising to upper extremities and abrasions -- baseline.    Neurological:     Mental Status: He is alert and oriented to person, place, and time.     Motor: Motor function is intact.     Coordination: Coordination is intact.     Gait: Gait is intact.     Deep Tendon Reflexes: Reflexes are normal and symmetric.  Psychiatric:        Attention and Perception: Attention normal.        Mood and Affect: Mood normal.        Behavior: Behavior normal. Behavior is cooperative.        Thought Content: Thought content normal.    Results for orders placed or performed in visit on 03/11/22  CBC with Differential/Platelet  Result Value Ref Range   WBC 9.9 3.4 - 10.8 x10E3/uL   RBC 4.61 4.14 - 5.80 x10E6/uL   Hemoglobin 12.6 (L) 13.0 - 17.7 g/dL   Hematocrit 38.8 37.5 - 51.0 %   MCV 84 79 - 97 fL   MCH 27.3 26.6 - 33.0 pg   MCHC 32.5 31.5 - 35.7 g/dL   RDW 14.6 11.6 - 15.4 %   Platelets 306 150 - 450 x10E3/uL   Neutrophils 61 Not Estab. %   Lymphs 24 Not Estab. %   Monocytes 8 Not Estab. %   Eos 5 Not Estab. %   Basos 1 Not Estab. %   Neutrophils Absolute 6.0 1.4 - 7.0 x10E3/uL   Lymphocytes Absolute 2.4 0.7 - 3.1 x10E3/uL   Monocytes Absolute 0.8 0.1 - 0.9 x10E3/uL   EOS (ABSOLUTE) 0.5 (H) 0.0 - 0.4 x10E3/uL   Basophils Absolute 0.1 0.0 - 0.2 x10E3/uL   Immature Granulocytes 1 Not Estab. %   Immature Grans (Abs) 0.1 0.0 - 0.1 x10E3/uL   Comprehensive metabolic panel  Result Value Ref Range   Glucose 213 (H) 70 - 99 mg/dL   BUN 15 8 - 27 mg/dL   Creatinine, Ser 0.92  0.76 - 1.27 mg/dL   eGFR 93 >59 mL/min/1.73   BUN/Creatinine Ratio 16 10 - 24   Sodium 135 134 - 144 mmol/L   Potassium 5.0 3.5 - 5.2 mmol/L   Chloride 100 96 - 106 mmol/L   CO2 20 20 - 29 mmol/L   Calcium 9.5 8.6 - 10.2 mg/dL   Total Protein 6.9 6.0 - 8.5 g/dL   Albumin 4.0 3.8 - 4.8 g/dL   Globulin, Total 2.9 1.5 - 4.5 g/dL   Albumin/Globulin Ratio 1.4 1.2 - 2.2   Bilirubin Total 0.3 0.0 - 1.2 mg/dL   Alkaline Phosphatase 89 44 - 121 IU/L   AST 33 0 - 40 IU/L   ALT 24 0 - 44 IU/L  Lipase  Result Value Ref Range   Lipase 91 (H) 13 - 78 U/L  Amylase  Result Value Ref Range   Amylase 58 31 - 110 U/L  TSH  Result Value Ref Range   TSH 2.040 0.450 - 4.500 uIU/mL      Assessment & Plan:   Problem List Items Addressed This Visit       Other   Constipation    Chronic over past 3 months, suspect related to chronic opioid use and poor bowel regimen.  Will restart Miralax and Senna as in hospital which worked well for him.  Scripts sent.  Recommend increased hydration and ensure plenty of green leafy vegetables present in diet.  Return as scheduled, sooner if worsening symptoms.  May need Relistor or Amitiza if ongoing.       H/O acute pancreatitis     Follow up plan: Return for as scheduled in June.

## 2022-03-18 NOTE — Telephone Encounter (Signed)
Spoke with patient and notified him of Jolene's recommendations. Patient verbalized understanding and has no further question. Patient advised to give our office a call back if he has any questions or concerns.

## 2022-03-18 NOTE — Telephone Encounter (Signed)
  Chief Complaint: Constipation Symptoms: no BM since 'Sunday Frequency: since Sunday Pertinent Negatives: Patient denies Fever Disposition: []ED /[]Urgent Care (no appt availability in office) / [x]Appointment(In office/virtual)/ [] [facility]/ []Home Care/ []Refused Recommended Disposition /[] Mobile Bus/ [] Follow-up with PCP Additional Notes: Pt was released from hospital on 03/11/2022 with dulcolax. Pt is now out of this medication and has not had a BM since Sunday. Pt had a few very small sttols, after a lot of straining. Pt is in a lot of pain.  Reason for Disposition  Unable to have a bowel movement (BM) without laxative or enema  Answer Assessment - Initial Assessment Questions 1. STOOL PATTERN OR FREQUENCY: "How often do you have a bowel movement (BM)?"  (Normal range: 3 times a day to every 3 days)  "When was your last BM?"       2 times a day - last BM was Sunday 2. STRAINING: "Do you have to strain to have a BM?"      yes 3. RECTAL PAIN: "Does your rectum hurt when the stool comes out?" If Yes, ask: "Do you have hemorrhoids? How bad is the pain?"  (Scale 1-10; or mild, moderate, severe)     Yes - moderate 4. STOOL COMPOSITION: "Are the stools hard?"      yes 5. BLOOD ON STOOLS: "Has there been any blood on the toilet tissue or on the surface of the BM?" If Yes, ask: "When was the last time?"       6. CHRONIC CONSTIPATION: "Is this a new problem for you?"  If no, ask: "How long have you had this problem?" (days, weeks, months)      no 7. CHANGES IN DIET OR HYDRATION: "Have there been any recent changes in your diet?" "How much fluids are you drinking on a daily basis?"  "How much have you had to drink today?"      8. MEDICATIONS: "Have you been taking any new medications?" "Are you taking any narcotic pain medications?" (e.g., Vicodin, Percocet, morphine, Dilaudid)      9. LAXATIVES: "Have you been using any stool softeners, laxatives, or enemas?"  If yes,  ask "What, how often, and when was the last time?"     dulcolax 10. ACTIVITY:  "How much walking do you do every day?"  "Has your activity level decreased in the past week?"         11'$ . CAUSE: "What do you think is causing the constipation?"        unsure 12. OTHER SYMPTOMS: "Do you have any other symptoms?" (e.g., abdominal pain, bloating, fever, vomiting)       Abdominal pain 13. MEDICAL HISTORY: "Do you have a history of hemorrhoids, rectal fissures, or rectal surgery or rectal abscess?"         no 14. PREGNANCY: "Is there any chance you are pregnant?" "When was your last menstrual period?"       na  Protocols used: Constipation-A-AH

## 2022-03-18 NOTE — Telephone Encounter (Signed)
-----   Message from [name], NP sent at 03/13/2022  5:06 PM EDT ----- Please call patient and alert them that I did reach out to his endocrinologist, Dr. Kelton Pillar, and they have recommended he stop Trulicity -  Due to pancreatitis all GLP-1 agonists (like Trulicity) and DPP-4 inhibitors and Mounjaro are ALL contra-indicated from now on.

## 2022-03-18 NOTE — Assessment & Plan Note (Addendum)
Chronic over past 3 months, suspect related to chronic opioid use and poor bowel regimen.  Will restart Miralax and Senna as in hospital which worked well for him.  Scripts sent.  Recommend increased hydration and ensure plenty of green leafy vegetables present in diet.  Return as scheduled, sooner if worsening symptoms.  May need Relistor or Amitiza if ongoing.

## 2022-03-20 DIAGNOSIS — G4733 Obstructive sleep apnea (adult) (pediatric): Secondary | ICD-10-CM | POA: Diagnosis not present

## 2022-03-23 ENCOUNTER — Ambulatory Visit: Admission: RE | Admit: 2022-03-23 | Payer: Medicare Other | Source: Ambulatory Visit

## 2022-03-23 DIAGNOSIS — E119 Type 2 diabetes mellitus without complications: Secondary | ICD-10-CM | POA: Diagnosis not present

## 2022-03-23 DIAGNOSIS — H2513 Age-related nuclear cataract, bilateral: Secondary | ICD-10-CM | POA: Diagnosis not present

## 2022-03-23 LAB — HM DIABETES EYE EXAM

## 2022-04-01 DIAGNOSIS — M25519 Pain in unspecified shoulder: Secondary | ICD-10-CM | POA: Diagnosis not present

## 2022-04-01 DIAGNOSIS — G4700 Insomnia, unspecified: Secondary | ICD-10-CM | POA: Diagnosis not present

## 2022-04-01 DIAGNOSIS — M79669 Pain in unspecified lower leg: Secondary | ICD-10-CM | POA: Diagnosis not present

## 2022-04-01 DIAGNOSIS — M179 Osteoarthritis of knee, unspecified: Secondary | ICD-10-CM | POA: Diagnosis not present

## 2022-04-01 DIAGNOSIS — E1142 Type 2 diabetes mellitus with diabetic polyneuropathy: Secondary | ICD-10-CM | POA: Diagnosis not present

## 2022-04-01 DIAGNOSIS — M9973 Connective tissue and disc stenosis of intervertebral foramina of lumbar region: Secondary | ICD-10-CM | POA: Diagnosis not present

## 2022-04-01 DIAGNOSIS — M9943 Connective tissue stenosis of neural canal of lumbar region: Secondary | ICD-10-CM | POA: Diagnosis not present

## 2022-04-01 DIAGNOSIS — M48062 Spinal stenosis, lumbar region with neurogenic claudication: Secondary | ICD-10-CM | POA: Diagnosis not present

## 2022-04-01 DIAGNOSIS — G894 Chronic pain syndrome: Secondary | ICD-10-CM | POA: Diagnosis not present

## 2022-04-01 DIAGNOSIS — M792 Neuralgia and neuritis, unspecified: Secondary | ICD-10-CM | POA: Diagnosis not present

## 2022-04-01 DIAGNOSIS — G8929 Other chronic pain: Secondary | ICD-10-CM | POA: Diagnosis not present

## 2022-04-01 DIAGNOSIS — M47896 Other spondylosis, lumbar region: Secondary | ICD-10-CM | POA: Diagnosis not present

## 2022-04-01 DIAGNOSIS — M25559 Pain in unspecified hip: Secondary | ICD-10-CM | POA: Diagnosis not present

## 2022-04-01 DIAGNOSIS — R609 Edema, unspecified: Secondary | ICD-10-CM | POA: Diagnosis not present

## 2022-04-01 DIAGNOSIS — M5136 Other intervertebral disc degeneration, lumbar region: Secondary | ICD-10-CM | POA: Diagnosis not present

## 2022-04-02 ENCOUNTER — Ambulatory Visit: Payer: Medicare Other | Admitting: Internal Medicine

## 2022-04-12 DIAGNOSIS — E1165 Type 2 diabetes mellitus with hyperglycemia: Secondary | ICD-10-CM | POA: Diagnosis not present

## 2022-04-12 DIAGNOSIS — E1142 Type 2 diabetes mellitus with diabetic polyneuropathy: Secondary | ICD-10-CM | POA: Diagnosis not present

## 2022-04-17 ENCOUNTER — Ambulatory Visit (INDEPENDENT_AMBULATORY_CARE_PROVIDER_SITE_OTHER): Payer: Medicare Other | Admitting: Nurse Practitioner

## 2022-04-17 ENCOUNTER — Encounter: Payer: Self-pay | Admitting: Nurse Practitioner

## 2022-04-17 VITALS — BP 118/76 | HR 76 | Temp 97.6°F | Ht 68.0 in | Wt 251.6 lb

## 2022-04-17 DIAGNOSIS — E1142 Type 2 diabetes mellitus with diabetic polyneuropathy: Secondary | ICD-10-CM | POA: Diagnosis not present

## 2022-04-17 DIAGNOSIS — Z0000 Encounter for general adult medical examination without abnormal findings: Secondary | ICD-10-CM | POA: Diagnosis not present

## 2022-04-17 MED ORDER — ATORVASTATIN CALCIUM 80 MG PO TABS: 80.0000 mg | ORAL_TABLET | Freq: Every day | ORAL | 4 refills | Status: DC

## 2022-04-17 MED ORDER — TRAZODONE HCL 100 MG PO TABS: 100.0000 mg | ORAL_TABLET | Freq: Every evening | ORAL | 4 refills | Status: AC | PRN

## 2022-04-17 MED ORDER — DULOXETINE HCL 60 MG PO CPEP: 60.0000 mg | ORAL_CAPSULE | Freq: Every day | ORAL | 4 refills | Status: DC

## 2022-04-17 MED ORDER — PANTOPRAZOLE SODIUM 40 MG PO TBEC
40.0000 mg | DELAYED_RELEASE_TABLET | Freq: Two times a day (BID) | ORAL | 4 refills | Status: DC
Start: 2022-04-17 — End: 2022-06-01

## 2022-04-17 MED ORDER — EZETIMIBE 10 MG PO TABS: 10.0000 mg | ORAL_TABLET | Freq: Every day | ORAL | 4 refills | Status: AC

## 2022-04-17 MED ORDER — CETIRIZINE HCL 10 MG PO TABS: 10.0000 mg | ORAL_TABLET | Freq: Every day | ORAL | 4 refills | Status: AC

## 2022-04-17 MED ORDER — DAPAGLIFLOZIN PROPANEDIOL 10 MG PO TABS: 10.0000 mg | ORAL_TABLET | Freq: Every day | ORAL | 4 refills | Status: AC

## 2022-04-17 NOTE — Patient Instructions (Signed)

## 2022-04-17 NOTE — Progress Notes (Signed)
BP 118/76   Pulse 76   Temp 97.6 F (36.4 C) (Oral)   Ht 5' 8"  (1.727 m)   Wt 251 lb 9.6 oz (114.1 kg)   SpO2 94%   BMI 38.26 kg/m    Subjective:    Patient ID: Edward Flesher., male    DOB: 1956/03/02, 66 y.o.   MRN: [id number]  HPI: Edward Reichardt. is a 66 y.o. male presenting on 04/17/2022 for comprehensive medical examination. Current medical complaints include:none  He currently lives with: wife Interim Problems from his last visit: no  All labs up to date -- A1c done in May 2022 & recent labs -- needs next labs in September 2022.  Functional Status Survey: Is the patient deaf or have difficulty hearing?: No Does the patient have difficulty seeing, even when wearing glasses/contacts?: No Does the patient have difficulty concentrating, remembering, or making decisions?: No Does the patient have difficulty walking or climbing stairs?: No Does the patient have difficulty dressing or bathing?: No Does the patient have difficulty doing errands alone such as visiting a doctor's office or shopping?: No  FALL RISK:    03/11/2022   10:54 AM 01/05/2022    8:07 AM 07/05/2021   10:52 AM 12/06/2020    3:43 PM 05/23/2020    8:02 AM  [place] in the past year? 0 0 0 0 0  Number falls in past yr: 0 0 0  0  Injury with Fall? 0 0 0 0 0  Risk for fall due to : No Fall Risks  No Fall Risks    Follow up Falls evaluation completed  Falls evaluation completed  Falls evaluation completed    Depression Screen    04/17/2022    8:28 AM 03/11/2022   10:54 AM 12/25/2021   10:02 AM 10/17/2021   11:01 AM 08/12/2021    1:16 PM  Depression screen PHQ 2/9  Decreased Interest 0 0 0 0 2  Down, Depressed, Hopeless 0 0 0  0  PHQ - 2 Score 0 0 0 0 2  Altered sleeping 0 0 3 3 1   Tired, decreased energy 3 3 3 2 2   Change in appetite 3 3 3 1 2   Feeling bad or failure about yourself  0 0 0 0 0  Trouble concentrating 0 0 0 0 0  Moving slowly or fidgety/restless 0 0 0 0 0  Suicidal  thoughts 0 0 0 0 0  PHQ-9 Score 6 6 9 6 7   Difficult doing work/chores Not difficult at all   Not difficult at all Somewhat difficult      04/17/2022    8:29 AM 03/11/2022   10:55 AM 12/25/2021   10:03 AM 10/17/2021   11:01 AM  GAD 7 : Generalized Anxiety Score  Nervous, Anxious, on Edge 0 0 0 0  Control/stop worrying 0 0 0 0  Worry too much - different things 0 0 0 0  Trouble relaxing 0 0 2 0  Restless 0 0 0 0  Easily annoyed or irritable 0 0 0 0  Afraid - awful might happen 0 0 0 0  Total GAD 7 Score 0 0 2 0  Anxiety Difficulty Not difficult at all Not difficult at all  Not difficult at all    Advanced Directives <no information>  Past Medical History:  Past Medical History:  Diagnosis Date   Allergy    Aortic atherosclerosis ([place])    Asthma  C. difficile diarrhea    Chronic pain    Collagenous colitis    Coronary artery disease    a.) PCI with 2.75 x 18 mm Resolute Onyx DES x 1 to prox/mid LAD on 09/05/2019   DDD (degenerative disc disease), cervical    DDD (degenerative disc disease), lumbar    GERD (gastroesophageal reflux disease)    Grade I diastolic dysfunction    Hepatic steatosis    Hyperlipidemia    Hypertension    Liver cancer ([place]) 03/2015   Migraines    Myocardial infarction (HCC)     OSA on CPAP    Seizures ([place])    several as child when sick.  None since age 64   Stroke [facility])    'mini-stroke" 30 yrs ago. no deficits.   T2DM (type 2 diabetes mellitus) ([place])    Wears dentures    full upper and lower    Surgical History:  Past Surgical History:  Procedure Laterality Date   APPENDECTOMY     BACK SURGERY     CARDIAC CATHETERIZATION     No stent placed in his "69's"   CERVICAL FUSION     COLONOSCOPY WITH PROPOFOL N/A 03/06/2016   Procedure: COLONOSCOPY WITH PROPOFOL;  Surgeon: Lucilla Lame, MD;  Location: [facility];  Service: Endoscopy;  Laterality: N/A;  requests early   COLONOSCOPY WITH PROPOFOL N/A 06/18/2020   Procedure:  COLONOSCOPY WITH PROPOFOL;  Surgeon: Lucilla Lame, MD;  Location[facility] ENDOSCOPY;  Service: Endoscopy;  Laterality: N/A;   ESOPHAGOGASTRODUODENOSCOPY (EGD) WITH PROPOFOL N/A 09/20/2017   Procedure: ESOPHAGOGASTRODUODENOSCOPY (EGD) WITH PROPOFOL;  Surgeon: Lucilla Lame, MD;  Location: [facility];  Service: Endoscopy;  Laterality: N/A;  Diabetic - oral meds   FINGER SURGERY Left    INTRAVASCULAR PRESSURE WIRE/FFR STUDY N/A 09/05/2019   Procedure: INTRAVASCULAR PRESSURE WIRE/FFR STUDY;  Surgeon: Nelva Bush, MD;  Location: [facility];  Service: Cardiovascular;  Laterality: N/A;   KNEE SURGERY Right    LEFT HEART CATH AND CORONARY ANGIOGRAPHY Left 09/05/2019   Procedure: LEFT HEART CATH AND CORONARY ANGIOGRAPHY (2.75 x 18 mm Resolute Onyx DES x 1 to prox/mid LAD);  Surgeon: Nelva Bush, MD;  Location: [place] [facility];  Service: Cardiovascular;  Laterality: Left;   NECK SURGERY     spleen surgery     TOE SURGERY Right    TOTAL HIP ARTHROPLASTY Right 06/03/2021   Procedure: TOTAL HIP ARTHROPLASTY ANTERIOR APPROACH;  Surgeon: Hessie Knows, MD;  Location: [facility] ORS;  Service: Orthopedics;  Laterality: Right;   TOTAL KNEE ARTHROPLASTY Right 08/22/2021   Procedure: TOTAL KNEE ARTHROPLASTY;  Surgeon: Hessie Knows, MD;  Location: [facility] OR[facility];  Service: Orthopedics;  Laterality: Right;    Medications:  Current Outpatient Medications on File Prior to Visit  Medication Sig   acetaminophen (TYLENOL) 650 MG CR tablet Take 1,300 mg by mouth every 8 (eight) hours.   albuterol (VENTOLIN HFA) 108 (90 Base) MCG/ACT inhaler Inhale 2 puffs into the lungs every 6 (six) hours as needed for wheezing or shortness of breath.   Alcohol Swabs (PHARMACIST CHOICE ALCOHOL) PADS SMARTSIG:1 Each Topical 4 Times Daily   Ascorbic Acid (VITAMIN C) 1000 MG tablet Take 1,000 mg by mouth daily.    aspirin EC 81 MG tablet Take 81 mg by mouth daily. Swallow whole.   benazepril (LOTENSIN) 10 MG tablet  Take 1 tablet by mouth once daily   bisacodyl (DULCOLAX) 5 MG EC tablet Take 2 tablets (10 mg total) by  mouth daily in the afternoon.   Budeson-Glycopyrrol-Formoterol (BREZTRI AEROSPHERE) 160-9-4.8 MCG/ACT AERO Inhale 2 puffs into the lungs in the morning and at bedtime.   calcium carbonate (OS-CAL) 600 MG TABS tablet Take 600 mg by mouth daily.    Cholecalciferol (VITAMIN D3) 25 MCG (1000 UT) CHEW Chew 1,000 Units by mouth daily.    CINNAMON PO Take 1,000 mg by mouth 2 (two) times daily.    clobetasol cream (TEMOVATE) 5.63 % Apply 1 application topically 2 (two) times daily.   COMFORT EZ PEN NEEDLES 32G X 4 MM MISC    Continuous Blood Gluc Sensor (FREESTYLE LIBRE 2 SENSOR) MISC Use as instructed to check blood sugar daily   cyclobenzaprine (FLEXERIL) 10 MG tablet Take 1 tablet (10 mg total) by mouth 3 (three) times daily as needed for muscle spasms.   diclofenac Sodium (VOLTAREN) 1 % GEL Apply 2 g topically 4 (four) times daily as needed (pain).    diphenhydrAMINE (BENADRYL) 25 mg capsule Take 50 mg by mouth in the morning, at noon, and at bedtime.   doxepin (SINEQUAN) 25 MG capsule Take 50 mg by mouth at bedtime.    EPINEPHrine 0.3 mg/0.3 mL IJ SOAJ injection Inject 0.3 mg into the muscle as needed for anaphylaxis.   famotidine (PEPCID) 20 MG tablet TAKE 1 TABLET BY MOUTH TWICE DAILY   Ferrous Gluconate-C-Folic Acid (IRON-C PO) Take 30 mg by mouth daily.   Ginger, Zingiber officinalis, (GINGER PO) Take 1 tablet by mouth daily.   Ginseng 100 MG CAPS Take 100 mg by mouth daily.    Insulin Glargine (BASAGLAR KWIKPEN) 100 UNIT/ML Inject 32 Units into the skin daily.   Insulin Lispro-aabc (LYUMJEV KWIKPEN) 100 UNIT/ML KwikPen 22 units with Breakfast and continue 16 units with Lunch and 16 units with supper   isosorbide mononitrate (IMDUR) 60 MG 24 hr tablet Take 1 tablet by mouth once daily   lidocaine (XYLOCAINE) 2 % solution Use as directed 15 mLs in the mouth or throat as needed for mouth pain.    mometasone (ELOCON) 0.1 % ointment Apply topically 2 (two) times daily as needed.   Multiple Vitamins-Minerals (CENTRUM SILVER 50+MEN) TABS Take 1 tablet by mouth daily.   naproxen sodium (ALEVE) 220 MG tablet Take 440 mg by mouth daily as needed (body aches).   NARCAN 4 MG/0.1ML LIQD nasal spray kit Place 0.4 mg into the nose as needed (opioid overdose).    nitroGLYCERIN (NITROSTAT) 0.4 MG SL tablet Place 0.4 mg under the tongue every 5 (five) minutes as needed for chest pain.    Omega-3 1000 MG CAPS Take 1,000 mg by mouth daily.    ondansetron (ZOFRAN) 4 MG tablet Take 1 tablet (4 mg total) by mouth every 8 (eight) hours as needed for nausea or vomiting.   oxyCODONE (ROXICODONE) 15 MG immediate release tablet Take 15 mg by mouth every 4 (four) hours as needed for pain.   Plecanatide (TRULANCE) 3 MG TABS Take 1 tablet by mouth daily.   polyethylene glycol (MIRALAX / GLYCOLAX) 17 g packet Take 17 g by mouth 2 (two) times daily.   pregabalin (LYRICA) 100 MG capsule Take 1 capsule (100 mg total) by mouth 2 (two) times daily.   senna-docusate (SENOKOT-S) 8.6-50 MG tablet Take 1 tablet by mouth 2 (two) times daily.   sucralfate (CARAFATE) 1 g tablet TAKE 1 TABLET BY MOUTH 4 TIMES DAILY WITH MEALS AND AT BEDTIME   traMADol (ULTRAM) 50 MG tablet Take 1 tablet (50 mg total) by  mouth every 6 (six) hours as needed.   vitamin A 10000 UNIT capsule Take 10,000 Units by mouth daily.   vitamin B-12 (CYANOCOBALAMIN) 1000 MCG tablet Take 1,000 mcg by mouth daily.   Vitamin E 400 units TABS Take 400 Units by mouth daily.    No current facility-administered medications on file prior to visit.    Allergies:  Allergies  Allergen Reactions   Bee Venom Anaphylaxis   Crestor [Rosuvastatin Calcium] Shortness Of Breath and Swelling   Fentanyl Itching and Hives    blisters Patch   Gabapentin Diarrhea    Severe diarrhea which caused incontinence, loss of appetite and weight loss.   Shellfish Allergy  Anaphylaxis and Swelling    Shrimp causes throat to swell and tingling in tongue.    [employer]    Furosemide Nausea And Vomiting   Buprenorphine Hcl Itching   Chlorhexidine Gluconate Itching and Rash   Simvastatin Diarrhea    Social History:  Social History   Socioeconomic History   Marital status: Married    Spouse name: Not on file   Number of children: Not on file   Years of education: 13.5   Highest education level: Some college, no degree  Occupational History   Occupation: disability   Tobacco Use   Smoking status: Former    Packs/day: 2.00    Years: 50.00    Total pack years: 100.00    Types: Cigarettes    Quit date: 2011    Years since quitting: 12.5   Smokeless tobacco: Never  Vaping Use   Vaping Use: Never used  Substance and Sexual Activity   Alcohol use: No    Alcohol/week: 0.0 standard drinks of alcohol   Drug use: No   Sexual activity: Not on file  Other Topics Concern   Not on file  Social History Narrative   Right handed    Lives with wife   Social Determinants of Health   Financial Resource Strain: Medium Risk (07/05/2021)   Overall Financial Resource Strain (CARDIA)    Difficulty of Paying Living Expenses: Somewhat hard  Food Insecurity: No Food Insecurity (07/05/2021)   Hunger Vital Sign    Worried About Running Out of Food in the Last Year: Never true    Ran Out of Food in the Last Year: Never true  Transportation Needs: No Transportation Needs (07/05/2021)   PRAPARE - Hydrologist (Medical): No    Lack of Transportation (Non-Medical): No  Physical Activity: Insufficiently Active (07/05/2021)   Exercise Vital Sign    Days of Exercise per Week: 7 days    Minutes of Exercise per Session: 20 min  Stress: No Stress Concern Present (07/05/2021)   [place]    Feeling of Stress : Not at all  Social Connections: Moderately Integrated (07/05/2021)   Social  Connection and Isolation Panel [NHANES]    Frequency of Communication with Friends and Family: Three times a week    Frequency of Social Gatherings with Friends and Family: Once a week    Attends Religious Services: More than 4 times per year    Active Member of [employer] or Organizations: No    Attends Archivist Meetings: Never    Marital Status: Married  Human resources officer Violence: Not At Risk (07/05/2021)   Humiliation, Afraid, Rape, and Kick questionnaire    Fear of Current or Ex-Partner: No    Emotionally Abused: No    Physically  Abused: No    Sexually Abused: No   Social History   Tobacco Use  Smoking Status Former   Packs/day: 2.00   Years: 50.00   Total pack years: 100.00   Types: Cigarettes   Quit date: 2011   Years since quitting: 12.5  Smokeless Tobacco Never   Social History   Substance and Sexual Activity  Alcohol Use No   Alcohol/week: 0.0 standard drinks of alcohol    Family History:  Family History  Problem Relation Age of Onset   Arthritis Mother    Diabetes Mother    Kidney disease Mother    Heart disease Mother    Hypertension Mother    Arthritis Father    Hearing loss Father    Hypertension Father    Heart disease Father    Diabetes Sister    Heart disease Sister    Diabetes Daughter    Diabetes Maternal Aunt    Diabetes Maternal Grandmother    Heart Problems Brother    Heart Problems Brother    Heart Problems Brother    Heart attack Maternal Grandfather    Colon cancer Paternal Grandfather     Past medical history, surgical history, medications, allergies, family history and social history reviewed with patient today and changes made to appropriate areas of the chart.   ROS All other ROS negative except what is listed above and in the HPI.      Objective:    BP 118/76   Pulse 76   Temp 97.6 F (36.4 C) (Oral)   Ht 5' 8"  (1.727 m)   Wt 251 lb 9.6 oz (114.1 kg)   SpO2 94%   BMI 38.26 kg/m   Wt Readings from Last 3  Encounters:  04/17/22 251 lb 9.6 oz (114.1 kg)  03/18/22 249 lb 12.8 oz (113.3 kg)  03/11/22 249 lb 6.4 oz (113.1 kg)    Physical Exam Vitals and nursing note reviewed.  Constitutional:      General: He is awake. He is not in acute distress.    Appearance: He is well-developed and well-groomed. He is obese. He is not ill-appearing or toxic-appearing.  HENT:     Head: Normocephalic and atraumatic.     Right Ear: Hearing, tympanic membrane, ear canal and external ear normal. No drainage.     Left Ear: Hearing, tympanic membrane, ear canal and external ear normal. No drainage.     Nose: Nose normal.     Mouth/Throat:     Pharynx: Uvula midline.  Eyes:     General: Lids are normal.        Right eye: No discharge.        Left eye: No discharge.     Extraocular Movements: Extraocular movements intact.     Conjunctiva/sclera: Conjunctivae normal.     Pupils: Pupils are equal, round, and reactive to light.     Visual Fields: Right eye visual fields normal and left eye visual fields normal.  Neck:     Thyroid: No thyromegaly.     Vascular: No carotid bruit or JVD.     Trachea: Trachea normal.  Cardiovascular:     Rate and Rhythm: Normal rate and regular rhythm.     Heart sounds: Normal heart sounds, S1 normal and S2 normal. No murmur heard.    No gallop.  Pulmonary:     Effort: Pulmonary effort is normal. No accessory muscle usage or respiratory distress.     Breath sounds: Normal breath sounds.  Abdominal:  General: Bowel sounds are normal.     Palpations: Abdomen is soft. There is no hepatomegaly or splenomegaly.     Tenderness: There is no abdominal tenderness.  Musculoskeletal:        General: Normal range of motion.     Cervical back: Normal range of motion and neck supple.     Right lower leg: No edema.     Left lower leg: No edema.  Lymphadenopathy:     Head:     Right side of head: No submental, submandibular, tonsillar, preauricular or posterior auricular adenopathy.      Left side of head: No submental, submandibular, tonsillar, preauricular or posterior auricular adenopathy.     Cervical: No cervical adenopathy.  Skin:    General: Skin is warm and dry.     Capillary Refill: Capillary refill takes less than 2 seconds.     Findings: No rash.  Neurological:     Mental Status: He is alert and oriented to person, place, and time.     Gait: Gait is intact.     Deep Tendon Reflexes: Reflexes are normal and symmetric.     Reflex Scores:      Brachioradialis reflexes are 2+ on the right side and 2+ on the left side.      Patellar reflexes are 2+ on the right side and 2+ on the left side. Psychiatric:        Attention and Perception: Attention normal.        Mood and Affect: Mood normal.        Speech: Speech normal.        Behavior: Behavior normal. Behavior is cooperative.        Thought Content: Thought content normal.        Cognition and Memory: Cognition normal.     Results for orders placed or performed in visit on 04/14/22  HM DIABETES EYE EXAM  Result Value Ref Range   HM Diabetic Eye Exam No Retinopathy No Retinopathy      Assessment & Plan:   Problem List Items Addressed This Visit       Endocrine   DM type 2 with diabetic peripheral neuropathy (HCC)    Chronic, ongoing will continue to collaborate with endo.  A1C 9.8% last check, will recheck next visit if has not seen endo as of that time.  Continue Duloxetine and collaboration with pain management + collaboration with endocrinology and current medication regimen. + collaboration with CCM team.  Recommend continue to monitor BS QID with [name] and focus heavily on diet and modest weight loss.  Bring Libre to visits.        Relevant Medications   atorvastatin (LIPITOR) 80 MG tablet   dapagliflozin propanediol (FARXIGA) 10 MG TABS tablet   DULoxetine (CYMBALTA) 60 MG capsule   traZODone (DESYREL) 100 MG tablet   Other Relevant Orders   Ambulatory referral to Podiatry   Other Visit  Diagnoses     Encounter for annual physical exam    -  Primary   Annual physical today -- no acute concerns.  Labs up to date.        Discussed aspirin prophylaxis for myocardial infarction prevention and decision was made to continue ASA  LABORATORY TESTING:  Health maintenance labs ordered today as discussed above.   The natural history of prostate cancer and ongoing controversy regarding screening and potential treatment outcomes of prostate cancer has been discussed with the patient. The meaning of a false positive PSA  and a false negative PSA has been discussed. He indicates understanding of the limitations of this screening test and wishes to proceed with screening PSA testing -- UP TO DATE.   IMMUNIZATIONS:   - Tdap: Tetanus vaccination status reviewed: last tetanus booster within 10 years. - Influenza: Up to date - Pneumovax: Up to date - Prevnar:  will get next visit - Zostavax vaccine: Up to date  SCREENING: - Colonoscopy: Up to date  Discussed with patient purpose of the colonoscopy is to detect colon cancer at curable precancerous or early stages   - AAA Screening: Not applicable  -Hearing Test: Not applicable  -Spirometry: Not applicable   PATIENT COUNSELING:    Sexuality: Discussed sexually transmitted diseases, partner selection, use of condoms, avoidance of unintended pregnancy  and contraceptive alternatives.   Advised to avoid cigarette smoking.  I discussed with the patient that most people either abstain from alcohol or drink within safe limits (<=14/week and <=4 drinks/occasion for males, <=7/weeks and <= 3 drinks/occasion for females) and that the risk for alcohol disorders and other health effects rises proportionally with the number of drinks per week and how often a drinker exceeds daily limits.  Discussed cessation/primary prevention of drug use and availability of treatment for abuse.   Diet: Encouraged to adjust caloric intake to maintain  or  achieve ideal body weight, to reduce intake of dietary saturated fat and total fat, to limit sodium intake by avoiding high sodium foods and not adding table salt, and to maintain adequate dietary potassium and calcium preferably from fresh fruits, vegetables, and low-fat dairy products.    Stressed the importance of regular exercise  Injury prevention: Discussed safety belts, safety helmets, smoke detector, smoking near bedding or upholstery.   Dental health: Discussed importance of regular tooth brushing, flossing, and dental visits.   Follow up plan: NEXT PREVENTATIVE PHYSICAL DUE IN 1 YEAR. Return in about 3 months (around 07/08/2022) for T2DM, HTN/HLD, MOOD, CHRONIC PAIN, BPH.

## 2022-04-17 NOTE — Assessment & Plan Note (Signed)
Chronic, ongoing will continue to collaborate with endo.  A1C 9.8% last check, will recheck next visit if has not seen endo as of that time.  Continue Duloxetine and collaboration with pain management + collaboration with endocrinology and current medication regimen. + collaboration with CCM team.  Recommend continue to monitor BS QID with [name] and focus heavily on diet and modest weight loss.  Bring Libre to visits.

## 2022-04-19 DIAGNOSIS — G4733 Obstructive sleep apnea (adult) (pediatric): Secondary | ICD-10-CM | POA: Diagnosis not present

## 2022-04-20 ENCOUNTER — Ambulatory Visit: Payer: Self-pay | Admitting: *Deleted

## 2022-04-20 ENCOUNTER — Ambulatory Visit: Payer: PPO | Admitting: Physician Assistant

## 2022-04-20 NOTE — Telephone Encounter (Signed)
Reason for Disposition  [1] SEVERE diarrhea (e.g., 7 or more times / day more than normal) AND [2] age > 60 years  Answer Assessment - Initial Assessment Questions 1. DIARRHEA SEVERITY: "How bad is the diarrhea?" "How many more stools have you had in the past 24 hours than normal?"    - NO DIARRHEA (SCALE 0)   - MILD (SCALE 1-3): Few loose or mushy BMs; increase of 1-3 stools over normal daily number of stools; mild increase in ostomy output.   -  MODERATE (SCALE 4-7): Increase of 4-6 stools daily over normal; moderate increase in ostomy output. * SEVERE (SCALE 8-10; OR 'WORST POSSIBLE'): Increase of 7 or more stools daily over normal; moderate increase in ostomy output; incontinence.     Having bad diarrhea.  He was in office Friday for a 6 mo. Check up but I don't know if he mentioned.   Wife calling in.   He has a problem with his pancreas.   He has been drinking Insurance claims handler and eating chicken noodle coup and Ginger Ale.  2. ONSET: "When did the diarrhea begin?"      For last 3-4 days.   He has been many times.  He can't get to the toilet fast enough. 3. BM CONSISTENCY: "How loose or watery is the diarrhea?"      Watery and has blood in it.   His butt is burning too.    4. VOMITING: "Are you also vomiting?" If Yes, ask: "How many times in the past 24 hours?"      No 5. ABDOMINAL PAIN: "Are you having any abdominal pain?" If Yes, ask: "What does it feel like?" (e.g., crampy, dull, intermittent, constant)      He has a lot of stomach problems with pancreas problems. I don't leave him alone and family helps me look after him.    6. ABDOMINAL PAIN SEVERITY: If present, ask: "How bad is the pain?"  (e.g., Scale 1-10; mild, moderate, or severe)   - MILD (1-3): doesn't interfere with normal activities, abdomen soft and not tender to touch    - MODERATE (4-7): interferes with normal activities or awakens from sleep, abdomen tender to touch    - SEVERE (8-10): excruciating pain, doubled over, unable  to do any normal activities       Severe 7. ORAL INTAKE: If vomiting, "Have you been able to drink liquids?" "How much liquids have you had in the past 24 hours?"     Not vomiting but  8. HYDRATION: "Any signs of dehydration?" (e.g., dry mouth [not just dry lips], too weak to stand, dizziness, new weight loss) "When did you last urinate?"     Not so far.    9. EXPOSURE: "Have you traveled to a foreign country recently?" "Have you been exposed to anyone with diarrhea?" "Could you have eaten any food that was spoiled?"     No  He doesn't go anywhere except the doctor.   10. ANTIBIOTIC USE: "Are you taking antibiotics now or have you taken antibiotics in the past 2 months?"       Doesn't think so. 11. OTHER SYMPTOMS: "Do you have any other symptoms?" (e.g., fever, blood in stool)       Blood in stool with diarrhea 12. PREGNANCY: "Is there any chance you are pregnant?" "When was your last menstrual period?"       N/A  Protocols used: Diarrhea-A-AH  Chief Complaint: watery diarrhea for 3-4 days now.  Weak and  poor appetite  (Wife called in)   Pt thinks it's his pancreas and "there's nothing they can do". Symptoms: Blood in diarrhea, watery, can't hardly make it to the toilet in time, weak. Frequency: last 3-4 days Pertinent Negatives: Patient denies being on antibiotics Disposition: '[]'$ ED /'[]'$ Urgent Care (no appt availability in office) / '[x]'$ Appointment(In office/virtual)/ '[]'$  [facility]/ '[]'$ Home Care/ '[]'$ Refused Recommended Disposition /'[]'$ [facility]/ '[]'$  Follow-up with PCP Additional Notes: Appt made for today with Talitha Givens, PA for 3:40 today.   Instructed to go to ED if he becomes dizzy or the bleeding becomes worse.

## 2022-04-22 ENCOUNTER — Other Ambulatory Visit (HOSPITAL_COMMUNITY)
Admission: RE | Admit: 2022-04-22 | Discharge: 2022-04-22 | Disposition: A | Discharge status: Home / Self Care | Payer: Medicare Other | Source: Ambulatory Visit | Attending: Unknown Physician Specialty | Admitting: Unknown Physician Specialty

## 2022-04-22 ENCOUNTER — Encounter: Payer: Self-pay | Admitting: Unknown Physician Specialty

## 2022-04-22 ENCOUNTER — Ambulatory Visit (INDEPENDENT_AMBULATORY_CARE_PROVIDER_SITE_OTHER): Payer: Medicare Other | Admitting: Unknown Physician Specialty

## 2022-04-22 VITALS — BP 118/64 | HR 90 | Temp 98.1°F | Ht 67.99 in | Wt 250.6 lb

## 2022-04-22 DIAGNOSIS — M7022 Olecranon bursitis, left elbow: Secondary | ICD-10-CM

## 2022-04-22 NOTE — Progress Notes (Signed)
BP 118/64   Pulse 90   Temp 98.1 F (36.7 C) (Oral)   Ht 5' 7.99" (1.727 m)   Wt 250 lb 9.6 oz (113.7 kg)   SpO2 92%   BMI 38.11 kg/m    Subjective:    Patient ID: Edward Schneider., male    DOB: Aug 27, 1956, 66 y.o.   MRN: [id number]  HPI: Edward Rineer. is a 66 y.o. male  Chief Complaint  Patient presents with   Shoulder Pain    All started yesterday Left shoulder    Joint Swelling    Elbow swelling started yesterday left elbow   Elbow bursitis- States "knot" on elbow started last night.  Stating it starting to hurt and would like it drained after reviewing options.   Relevant past medical, surgical, family and social history reviewed and updated as indicated. Interim medical history since our last visit reviewed. Allergies and medications reviewed and updated.  Review of Systems  Per HPI unless specifically indicated above     Objective:    BP 118/64   Pulse 90   Temp 98.1 F (36.7 C) (Oral)   Ht 5' 7.99" (1.727 m)   Wt 250 lb 9.6 oz (113.7 kg)   SpO2 92%   BMI 38.11 kg/m   Wt Readings from Last 3 Encounters:  04/22/22 250 lb 9.6 oz (113.7 kg)  04/17/22 251 lb 9.6 oz (114.1 kg)  03/18/22 249 lb 12.8 oz (113.3 kg)    Physical Exam Constitutional:      General: He is not in acute distress.    Appearance: Normal appearance. He is well-developed.  HENT:     Head: Normocephalic and atraumatic.  Eyes:     General: Lids are normal. No scleral icterus.       Right eye: No discharge.        Left eye: No discharge.     Conjunctiva/sclera: Conjunctivae normal.  Cardiovascular:     Rate and Rhythm: Normal rate.  Pulmonary:     Effort: Pulmonary effort is normal.  Abdominal:     Palpations: There is no hepatomegaly or splenomegaly.  Musculoskeletal:        General: Normal range of motion.     Comments: Left elbow olcranean bursitis  Skin:    Coloration: Skin is not pale.     Findings: No rash.  Neurological:     Mental Status: He is alert and  oriented to person, place, and time.  Psychiatric:        Behavior: Behavior normal.        Thought Content: Thought content normal.        Judgment: Judgment normal.   Elbow bursitis- after numbing with Lidocaine. Drained 8 ccs of sanguinous fluid followed by injection of 1 cc of K40.  Wrapped with colban.    Results for orders placed or performed in visit on 04/14/22  HM DIABETES EYE EXAM  Result Value Ref Range   HM Diabetic Eye Exam No Retinopathy No Retinopathy      Assessment & Plan:   Problem List Items Addressed This Visit   None Visit Diagnoses     Olecranon bursitis of left elbow    -  Primary   Procedure noted above tolerated well.  Maintain compressin for 24 hours.  RTC for signs of infection.  Fluid sent for cytology        Follow up plan: Return if symptoms worsen or fail to improve.

## 2022-04-22 NOTE — Addendum Note (Signed)
Addended by: Kathrine Haddock on: 04/22/2022 11:33 AM   Modules accepted: Orders

## 2022-04-23 LAB — CYTOLOGY - NON PAP

## 2022-04-29 DIAGNOSIS — M1711 Unilateral primary osteoarthritis, right knee: Secondary | ICD-10-CM | POA: Diagnosis not present

## 2022-04-29 DIAGNOSIS — G894 Chronic pain syndrome: Secondary | ICD-10-CM | POA: Diagnosis not present

## 2022-04-29 DIAGNOSIS — M5136 Other intervertebral disc degeneration, lumbar region: Secondary | ICD-10-CM | POA: Diagnosis not present

## 2022-04-29 DIAGNOSIS — Z96651 Presence of right artificial knee joint: Secondary | ICD-10-CM | POA: Diagnosis not present

## 2022-04-29 DIAGNOSIS — M179 Osteoarthritis of knee, unspecified: Secondary | ICD-10-CM | POA: Diagnosis not present

## 2022-04-29 DIAGNOSIS — M19011 Primary osteoarthritis, right shoulder: Secondary | ICD-10-CM | POA: Diagnosis not present

## 2022-04-29 DIAGNOSIS — M25361 Other instability, right knee: Secondary | ICD-10-CM | POA: Diagnosis not present

## 2022-04-29 DIAGNOSIS — G8921 Chronic pain due to trauma: Secondary | ICD-10-CM | POA: Diagnosis not present

## 2022-04-29 DIAGNOSIS — M169 Osteoarthritis of hip, unspecified: Secondary | ICD-10-CM | POA: Diagnosis not present

## 2022-05-07 DIAGNOSIS — E1140 Type 2 diabetes mellitus with diabetic neuropathy, unspecified: Secondary | ICD-10-CM | POA: Diagnosis not present

## 2022-05-07 DIAGNOSIS — M2042 Other hammer toe(s) (acquired), left foot: Secondary | ICD-10-CM | POA: Diagnosis not present

## 2022-05-07 DIAGNOSIS — Z794 Long term (current) use of insulin: Secondary | ICD-10-CM | POA: Diagnosis not present

## 2022-05-07 DIAGNOSIS — L600 Ingrowing nail: Secondary | ICD-10-CM | POA: Diagnosis not present

## 2022-05-07 DIAGNOSIS — B351 Tinea unguium: Secondary | ICD-10-CM | POA: Diagnosis not present

## 2022-05-07 DIAGNOSIS — M2041 Other hammer toe(s) (acquired), right foot: Secondary | ICD-10-CM | POA: Diagnosis not present

## 2022-05-13 DIAGNOSIS — E1165 Type 2 diabetes mellitus with hyperglycemia: Secondary | ICD-10-CM | POA: Diagnosis not present

## 2022-05-13 DIAGNOSIS — E1142 Type 2 diabetes mellitus with diabetic polyneuropathy: Secondary | ICD-10-CM | POA: Diagnosis not present

## 2022-05-20 DIAGNOSIS — G4733 Obstructive sleep apnea (adult) (pediatric): Secondary | ICD-10-CM | POA: Diagnosis not present

## 2022-05-26 ENCOUNTER — Ambulatory Visit: Payer: Self-pay

## 2022-05-26 DIAGNOSIS — M5136 Other intervertebral disc degeneration, lumbar region: Secondary | ICD-10-CM | POA: Diagnosis not present

## 2022-05-26 DIAGNOSIS — M169 Osteoarthritis of hip, unspecified: Secondary | ICD-10-CM | POA: Diagnosis not present

## 2022-05-26 DIAGNOSIS — M179 Osteoarthritis of knee, unspecified: Secondary | ICD-10-CM | POA: Diagnosis not present

## 2022-05-26 DIAGNOSIS — G894 Chronic pain syndrome: Secondary | ICD-10-CM | POA: Diagnosis not present

## 2022-05-26 NOTE — Telephone Encounter (Signed)
Addendum: 7. Pt did not stated he had ever had this sx before 8. Pt in severe pain- skipped to go to disposition 9. Eating makes pain worse- pain that lasts 2 hours after eating.

## 2022-05-26 NOTE — Telephone Encounter (Signed)
  Chief Complaint: severe abdominal pain 2 hours after eating- left upper under rib cage Symptoms: black bowel movement, pain is so severe "I can feel it to my toes", pt sounded like he was in a lot of pain, tearful Frequency: 2 weeks ago Pertinent Negatives: Patient denies back pain, fever, urination pain, vomiting Disposition: '[x]'$ ED /'[]'$ Urgent Care (no appt availability in office) / '[]'$ Appointment(In office/virtual)/ '[]'$  [facility]/ '[]'$ Home Care/ '[]'$ Refused Recommended Disposition /'[]'$ [facility]/ '[]'$  Follow-up with PCP Additional Notes: Pt agreeable to go to ED  Reason for Disposition  Black or tarry bowel movements  (Exception: Chronic-unchanged black-grey BMs AND is taking iron pills or Pepto-Bismol.)  Answer Assessment - Initial Assessment Questions 1. LOCATION: "Where does it hurt?"      Left side under rib cage 2. RADIATION: "Does the pain shoot anywhere else?" (e.g., chest, back)     Hurts whole body  3. ONSET: "When did the pain begin?" (Minutes, hours or days ago)      2 weeks 4. SUDDEN: "Gradual or sudden onset?"     sudden 5. PATTERN "Does the pain come and go, or is it constant?"    - If it comes and goes: "How long does it last?" "Do you have pain now?"     (Note: Comes and goes means the pain is intermittent. It goes away completely between bouts.)    - If constant: "Is it getting better, staying the same, or getting worse?"      (Note: Constant means the pain never goes away completely; most serious pain is constant and gets worse.)      After eating stays 1-2 hours  6. SEVERITY: "How bad is the pain?"  (e.g., Scale 1-10; mild, moderate, or severe)    - MILD (1-3): Doesn't interfere with normal activities, abdomen soft and not tender to touch.     - MODERATE (4-7): Interferes with normal activities or awakens from sleep, abdomen tender to touch.     - SEVERE (8-10): Excruciating pain, doubled over, unable to do any normal activities.       severe 7.  RECURRENT SYMPTOM: "Have you ever had this type of stomach pain before?" If Yes, ask: "When was the last time?" and "What happened that time?"      *No Answer* 8. CAUSE: "What do you think is causing the stomach pain?"     *No Answer* 9. RELIEVING/AGGRAVATING FACTORS: "What makes it better or worse?" (e.g., antacids, bending or twisting motion, bowel movement)     *No Answer* 10. OTHER SYMPTOMS: "Do you have any other symptoms?" (e.g., back pain, diarrhea, fever, urination pain, vomiting)       Black BM  Protocols used: Abdominal Pain - Male-A-AH

## 2022-05-27 DIAGNOSIS — M179 Osteoarthritis of knee, unspecified: Secondary | ICD-10-CM | POA: Diagnosis not present

## 2022-05-27 DIAGNOSIS — G894 Chronic pain syndrome: Secondary | ICD-10-CM | POA: Diagnosis not present

## 2022-05-27 DIAGNOSIS — M169 Osteoarthritis of hip, unspecified: Secondary | ICD-10-CM | POA: Diagnosis not present

## 2022-05-27 DIAGNOSIS — M5136 Other intervertebral disc degeneration, lumbar region: Secondary | ICD-10-CM | POA: Diagnosis not present

## 2022-05-29 ENCOUNTER — Telehealth: Payer: Self-pay

## 2022-05-29 NOTE — Chronic Care Management (AMB) (Signed)
Chronic Care Management Pharmacy Assistant   Name: Edward Schneider.  MRN: [id number] DOB: January 06, 1956   Reason for Encounter: Disease State Diabetes Mellitus    Recent office visits:  04/22/22-Cheryl Julian Hy, NP Seen for shoulder and joint swelling. Return if symptoms worsen or fail to improve. 04/17/22-Jolene T. Ned Card, NP. (PCP) Seen for annual exam visit. Ambulatory referral to Podiatry. Follow up in 3 months. 03/18/22-Jolene T. Ned Card, NP. (PCP) Seen for constipation. Restart Miralax and senna. Return for as scheduled in June. 03/11/22-Jolene T. Ned Card, NP. (PCP) General follow up visit. MRI ordered of the abdomen. Labs ordered. Return for as scheduled 04/17/22. 02/25/22-Jolene T. Cannady, NP. (PCP) Seen for weight loss and abdominal pain. Return for as scheduled 04/17/22. 12/31/21-Jolene T. Ned Card, NP. (PCP) Seen for abdominal pain. Labs ordered. Ambulatory referral to Gastroenterology. Follow up in 8 weeks. 12/25/21-Jolene T. Ned Card, NP (PCP) Seen for pain. Labs ordered. Ambulatory referral to General Surgery. Return for as scheduled on 12/31/21. 12/12/21-Megan Annia Friendly, DO (PCP) Seen for Blister. Labs ordered. Follow up in 1-2 weeks.  Recent consult visits:  04/29/22-Michal Terrilee Croak, MD (Orthopedic) Seen for pain follow up. Follow up in 3 months. 02/23/22-Elizabeth Teresita Madura, NP (Pulmonology) Follow up visit. Stop STIOLTO and Trelegy. Start SunGard- take two puffs morning and evening (do not use more than prescribed). Follow up in 3 months. 02/16/22-Darren Allen Norris, MD (Gastroenterology) New patient visit.Samples of  Trulance for his constipation. 02/15/22-Clinton D. Young (Pulmonary) Notes not available. 02/13/22-Michael Terrilee Croak (Orthopedic) Notes not available. 02/12/22-Elizabeth Teresita Madura, NP (Pulmonary) General follow up visit. 02/03/22-Ana Graham-Benitez (Dermatology) Notes not available. 01/23/22-Cadence H. Jorene Minors (Cardiology) Seen for chest tightness.  Follow up in  1 year. 01/20/22-Gregory Crisp (Pain medicine) Notes not available. 01/07/22-Elizabeth Teresita Madura, NP (Pulmonary) General follow up visit. Cpap titration is scheduled for April 4th. Follow up in 3 months. 01/05/22-Donika K. Patelm, DO (Neurology) Seen for numbness and tingling. MRI of brain, head and neck is ordered. 12/30/21-Denny Christian Mate, MD (General surgery) Seen for abdominal pain.  12/23/21-Ana Graham-Benitez (Dermatology) Notes not available. 12/22/21-Gregory Crisp (Pain medicine) Notes not available. 12/19/21-Elizabeth Teresita Madura (Pulmonary) General follow up visit. Started patient on lasix 61m BID x 5 days.   Hospital visits:  Medication Reconciliation was completed by comparing discharge summary, patient's EMR and Pharmacy list, and upon discussion with patient.  Admitted to the hospital on 03/05/22 due to abdominal pain . Discharge date was 03/06/22. [facility] none [facility]  Medication Changes at Hospital Discharge: -Changed none noted  Medications Discontinued at Hospital Discharge: -Stopped none noted  Medications that remain the same after Hospital Discharge:??  -All other medications will remain the same.       Medications: Outpatient Encounter Medications as of 05/29/2022  Medication Sig Note   acetaminophen (TYLENOL) 650 MG CR tablet Take 1,300 mg by mouth every 8 (eight) hours. 03/05/2022: PRN "body aches"   albuterol (VENTOLIN HFA) 108 (90 Base) MCG/ACT inhaler Inhale 2 puffs into the lungs every 6 (six) hours as needed for wheezing or shortness of breath. 03/05/2022: Takes BID   Alcohol Swabs (PHARMACIST CHOICE ALCOHOL) PADS SMARTSIG:1 Each Topical 4 Times Daily    Ascorbic Acid (VITAMIN C) 1000 MG tablet Take 1,000 mg by mouth daily.     aspirin EC 81 MG tablet Take 81 mg by mouth daily. Swallow whole.    atorvastatin (LIPITOR) 80 MG tablet Take 1 tablet (80  mg total)  by mouth at bedtime.    benazepril (LOTENSIN) 10 MG tablet Take 1 tablet by mouth once daily    bisacodyl (DULCOLAX) 5 MG EC tablet Take 2 tablets (10 mg total) by mouth daily in the afternoon.    Budeson-Glycopyrrol-Formoterol (BREZTRI AEROSPHERE) 160-9-4.8 MCG/ACT AERO Inhale 2 puffs into the lungs in the morning and at bedtime.    calcium carbonate (OS-CAL) 600 MG TABS tablet Take 600 mg by mouth daily.     cetirizine (ZYRTEC) 10 MG tablet Take 1 tablet (10 mg total) by mouth daily.    Cholecalciferol (VITAMIN D3) 25 MCG (1000 UT) CHEW Chew 1,000 Units by mouth daily.     CINNAMON PO Take 1,000 mg by mouth 2 (two) times daily.     clobetasol cream (TEMOVATE) 2.33 % Apply 1 application topically 2 (two) times daily. 03/05/2022: Applies to "ant bites" on arms and chest   COMFORT EZ PEN NEEDLES 32G X 4 MM MISC     Continuous Blood Gluc Sensor (FREESTYLE LIBRE 2 SENSOR) MISC Use as instructed to check blood sugar daily    cyclobenzaprine (FLEXERIL) 10 MG tablet Take 1 tablet (10 mg total) by mouth 3 (three) times daily as needed for muscle spasms.    dapagliflozin propanediol (FARXIGA) 10 MG TABS tablet Take 1 tablet (10 mg total) by mouth daily before breakfast.    diclofenac Sodium (VOLTAREN) 1 % GEL Apply 2 g topically 4 (four) times daily as needed (pain).     diphenhydrAMINE (BENADRYL) 25 mg capsule Take 50 mg by mouth in the morning, at noon, and at bedtime. 03/05/2022: Takes 2 tablets TID for as long as he can remember. States he is allergic to everything   doxepin (SINEQUAN) 25 MG capsule Take 50 mg by mouth at bedtime.     DULoxetine (CYMBALTA) 60 MG capsule Take 1 capsule (60 mg total) by mouth at bedtime.    EPINEPHrine 0.3 mg/0.3 mL IJ SOAJ injection Inject 0.3 mg into the muscle as needed for anaphylaxis.    ezetimibe (ZETIA) 10 MG tablet Take 1 tablet (10 mg total) by mouth daily.    famotidine (PEPCID) 20 MG tablet TAKE 1 TABLET BY MOUTH TWICE DAILY    Ferrous  Gluconate-C-Folic Acid (IRON-C PO) Take 30 mg by mouth daily.    Ginger, Zingiber officinalis, (GINGER PO) Take 1 tablet by mouth daily.    Ginseng 100 MG CAPS Take 100 mg by mouth daily.     Insulin Glargine (BASAGLAR KWIKPEN) 100 UNIT/ML Inject 32 Units into the skin daily.    Insulin Lispro-aabc (LYUMJEV KWIKPEN) 100 UNIT/ML KwikPen 22 units with Breakfast and continue 16 units with Lunch and 16 units with supper    isosorbide mononitrate (IMDUR) 60 MG 24 hr tablet Take 1 tablet by mouth once daily    lidocaine (XYLOCAINE) 2 % solution Use as directed 15 mLs in the mouth or throat as needed for mouth pain.    mometasone (ELOCON) 0.1 % ointment Apply topically 2 (two) times daily as needed. 03/05/2022: Applies to "ant bites"   Multiple Vitamins-Minerals (CENTRUM SILVER 50+MEN) TABS Take 1 tablet by mouth daily.    naproxen sodium (ALEVE) 220 MG tablet Take 440 mg by mouth daily as needed (body aches).    NARCAN 4 MG/0.1ML LIQD nasal spray kit Place 0.4 mg into the nose as needed (opioid overdose).     nitroGLYCERIN (NITROSTAT) 0.4 MG SL tablet Place 0.4 mg under the tongue every 5 (five) minutes as needed for chest pain.  Omega-3 1000 MG CAPS Take 1,000 mg by mouth daily.     ondansetron (ZOFRAN) 4 MG tablet Take 1 tablet (4 mg total) by mouth every 8 (eight) hours as needed for nausea or vomiting.    oxyCODONE (ROXICODONE) 15 MG immediate release tablet Take 15 mg by mouth every 4 (four) hours as needed for pain.    pantoprazole (PROTONIX) 40 MG tablet Take 1 tablet (40 mg total) by mouth 2 (two) times daily.    Plecanatide (TRULANCE) 3 MG TABS Take 1 tablet by mouth daily.    polyethylene glycol (MIRALAX / GLYCOLAX) 17 g packet Take 17 g by mouth 2 (two) times daily.    pregabalin (LYRICA) 100 MG capsule Take 1 capsule (100 mg total) by mouth 2 (two) times daily.    senna-docusate (SENOKOT-S) 8.6-50 MG tablet Take 1 tablet by mouth 2 (two) times daily.    sucralfate (CARAFATE) 1 g tablet  TAKE 1 TABLET BY MOUTH 4 TIMES DAILY WITH MEALS AND AT BEDTIME    traMADol (ULTRAM) 50 MG tablet Take 1 tablet (50 mg total) by mouth every 6 (six) hours as needed.    traZODone (DESYREL) 100 MG tablet Take 1 tablet (100 mg total) by mouth at bedtime as needed. for sleep    vitamin A 10000 UNIT capsule Take 10,000 Units by mouth daily.    vitamin B-12 (CYANOCOBALAMIN) 1000 MCG tablet Take 1,000 mcg by mouth daily.    Vitamin E 400 units TABS Take 400 Units by mouth daily.     No facility-administered encounter medications on file as of 05/29/2022.    Recent Relevant Labs: Lab Results  Component Value Date/Time   HGBA1C 9.8 (H) 03/05/2022 06:03 AM   HGBA1C 8.3 (H) 08/23/2021 04:03 AM   HGBA1C 9.7 (H) 12/10/2020 10:14 AM   HGBA1C 8.9 (H) 09/02/2020 02:40 PM   MICROALBUR 30 (H) 03/05/2021 09:19 AM   MICROALBUR 80 (H) 05/23/2020 08:34 AM    Kidney Function Lab Results  Component Value Date/Time   CREATININE 0.92 03/11/2022 11:28 AM   CREATININE 1.09 03/05/2022 06:03 AM   CREATININE 1.10 12/19/2021 03:40 PM   CREATININE 0.85 02/28/2014 06:45 PM   CREATININE 0.94 10/13/2013 11:42 AM   GFR 66.47 01/24/2019 02:39 PM   GFRNONAA >60 03/05/2022 06:03 AM   GFRNONAA >60 02/28/2014 06:45 PM   GFRAA 75 12/06/2020 03:54 PM   GFRAA >60 02/28/2014 06:45 PM     Current antihyperglycemic regimen:  Farxiga 10 mg take 1 tab daily Basaglar 100 unit inject 32 units daily Lyumjev 100 units 22 units with Breakfast and continue 16 units with Lunch and 16 units with supper  Unsuccessful attempts to complete assessment call. I have called patient 3x and left 3 voicemail's for the patient to return my call when available.   Adherence Review: Is the patient currently on a STATIN medication? Yes Is the patient currently on ACE/ARB medication? No Does the patient have >5 day gap between last estimated fill dates? No   Care Gaps: INFLUENZA VACCINE:Due since 05/19/2022 Last eye exam / Retinopathy  Screening Last Annual Wellness Visit Last Diabetic Foot Exam   Star Rating Drugs: Atorvastatin 80 mg Last filled:05/26/22 90 DS Benazepril 10 mg Last filled:04/29/22 90 DS Farxiga 10 mg Last filled:None noted  Myriam Estrada, RMA Health Concierge  

## 2022-06-01 ENCOUNTER — Other Ambulatory Visit: Payer: Self-pay | Admitting: Nurse Practitioner

## 2022-06-01 ENCOUNTER — Ambulatory Visit: Payer: Medicare Other | Admitting: Primary Care

## 2022-06-01 NOTE — Telephone Encounter (Signed)
dont see where rx was ever received by pharmacy. Requested Prescriptions  Pending Prescriptions Disp Refills  . pantoprazole (PROTONIX) 40 MG tablet [Pharmacy Med Name: PANTOPRAZOLE '40MG'$  TABLETS] 180 tablet 4    Sig: TAKE 1 TABLET(40 MG) BY MOUTH TWICE DAILY     Gastroenterology: Proton Pump Inhibitors Passed - 06/01/2022  3:42 AM      Passed - Valid encounter within last 12 months    Recent Outpatient Visits          1 month ago Olecranon bursitis of left elbow   [facility] Kathrine Haddock, NP   1 month ago Encounter for annual physical exam   [place], [place] T, NP   2 months ago H/O acute pancreatitis   [facility] Cannady, Henrine Screws T, NP   2 months ago Centrilobular emphysema ([place])   [place], Jolene T, NP   3 months ago Morbid obesity ([place])   [place], Barbaraann Faster, NP      Future Appointments            In 1 month Cannady, Barbaraann Faster, NP [employer]

## 2022-06-02 ENCOUNTER — Ambulatory Visit: Admission: RE | Admit: 2022-06-02 | Payer: Medicare Other | Source: Ambulatory Visit

## 2022-06-02 ENCOUNTER — Ambulatory Visit: Payer: Medicare Other | Admitting: Primary Care

## 2022-06-10 ENCOUNTER — Ambulatory Visit: Payer: Medicare Other | Admitting: Nurse Practitioner

## 2022-06-10 ENCOUNTER — Encounter: Payer: Self-pay | Admitting: Nurse Practitioner

## 2022-06-10 VITALS — BP 96/66 | HR 91 | Temp 97.7°F | Ht 68.0 in | Wt 247.0 lb

## 2022-06-10 DIAGNOSIS — Z720 Tobacco use: Secondary | ICD-10-CM | POA: Diagnosis not present

## 2022-06-10 DIAGNOSIS — G4733 Obstructive sleep apnea (adult) (pediatric): Secondary | ICD-10-CM | POA: Diagnosis not present

## 2022-06-10 DIAGNOSIS — J432 Centrilobular emphysema: Secondary | ICD-10-CM | POA: Diagnosis not present

## 2022-06-10 MED ORDER — BREZTRI AEROSPHERE 160-9-4.8 MCG/ACT IN AERO: 2.0000 | INHALATION_SPRAY | Freq: Two times a day (BID) | RESPIRATORY_TRACT | 0 refills | Status: DC

## 2022-06-10 MED ORDER — BREZTRI AEROSPHERE 160-9-4.8 MCG/ACT IN AERO: 2.0000 | INHALATION_SPRAY | Freq: Two times a day (BID) | RESPIRATORY_TRACT | 5 refills | Status: DC

## 2022-06-10 NOTE — Assessment & Plan Note (Signed)
Former heavy smoker. Quit 2011. Referred to lung cancer screening program - next CT will need to be completed in November 2023.

## 2022-06-10 NOTE — Progress Notes (Unsigned)
@Patient  ID: Edward Schneider., male    DOB: 07-01-1956, 66 y.o.   MRN: [id number]  Chief Complaint  Patient presents with   Follow-up    Follow up.      Referring provider: [name], NP  HPI: 66 year old male, former smoker followed for OSA and emphysema. He is a patient of Dr. Judson Roch and last seen in office 02/23/2022 by Volanda Napoleon NP. Past medical history significant for migraines, HTN, DM, CAD, PAF, GERD, HLD, DDD, BPH, insomnia, obesity, depression.   TEST/EVENTS:  12/06/2019 spirometry: FVC 56, FEV1 54, ratio 72  08/25/2021 CTA chest: no evidence of PE. No LAD. Minimal bibasilar atelectasis. No suspicious nodules.  08/26/2021 echo: EF 60-65%. Mild LVH. Indeterminate diastolic fillings. Mildly increased RV thickness. Unable to measure PASP. RA mildly dilated.  02/10/2022 CPAP titration: required BiPAP 22/18  02/23/2022: OV with Volanda Napoleon NP. He had reported weight gain and abd swelling at previous OV; started on lasix. Dyspnea and swelling has improved since. Maintained on Stiolto for emphysema previously; had improvement with oral steroids so recommended step up to Trelegy. He had difficulty tolerating DPI. Still having daily symptoms. Rx [employer]. He has had difficulty tolerating CPAP in past. He underwent CPAP titration 4/25; required BiPAP 22/18. Orders sent for BiPAP auto IPAP 5-22, EPAP 5-18.  06/10/2022: Today - follow up Patient presents today for 3 month follow up. He reports that his breathing is relatively unchanged. He still gets winded with activities around the house, such as walking to the mailbox. He was supposed to change to [facility] at his last visit but never got his prescription filled. He thought it was going to be mailed to him. He has been using Stiolto in the interim but ran out of this a week or so ago. He tried to wax his car yesterday but got very short of breath during so and had to stop. Denies any cough, chest congestion, wheezing, hemoptysis, leg swelling,  orthopnea. He has been using his rescue inhaler more since he's been out of the Stiolto; otherwise, uses it a few times a week. He did receive his BiPAP since his last visit; rarely misses a night and sleeps better with it than he did the CPAP. His wife has not noticed him snoring since.   05/06/2022-06/04/2022: BiPAP Auto, Max IPAP 22, min EPAP 5, PS 4 28/30 days; 90% >4 hr; av usage 9 hr 20 min Pressure IPAP 95th 18.2, EPAP 95th 14.2  Leaks median 2.5, 95th 27.3 AHI 2.3   Allergies  Allergen Reactions   Bee Venom Anaphylaxis   Crestor [Rosuvastatin Calcium] Shortness Of Breath and Swelling   Fentanyl Itching and Hives    blisters Patch   Gabapentin Diarrhea    Severe diarrhea which caused incontinence, loss of appetite and weight loss.   Shellfish Allergy Anaphylaxis and Swelling    Shrimp causes throat to swell and tingling in tongue.    Fire [employer]    Furosemide Nausea And Vomiting   Buprenorphine Hcl Itching   Chlorhexidine Gluconate Itching and Rash   Simvastatin Diarrhea    Immunization History  Administered Date(s) Administered   Influenza,inj,Quad PF,6+ Mos 08/21/2015, 09/24/2016, 07/12/2017, 06/23/2018, 08/04/2018, 07/17/2020, 07/18/2021   Influenza-Unspecified 07/19/2014, 08/21/2015, 09/24/2016, 07/12/2017, 06/23/2018, 08/04/2018, 07/11/2019, 07/17/2020, 07/18/2021   Pneumococcal Polysaccharide-23 06/07/2012, 05/30/2020   Td 10/28/2007   Tdap 04/25/2018   Zoster Recombinat (Shingrix) 07/11/2019, 09/13/2019    Past Medical History:  Diagnosis Date   Allergy    Aortic atherosclerosis ([place])  Asthma    C. difficile diarrhea    Chronic pain    Collagenous colitis    Coronary artery disease    a.) PCI with 2.75 x 18 mm Resolute Onyx DES x 1 to prox/mid LAD on 09/05/2019   DDD (degenerative disc disease), cervical    DDD (degenerative disc disease), lumbar    GERD (gastroesophageal reflux disease)    Grade I diastolic dysfunction    Hepatic steatosis     Hyperlipidemia    Hypertension    Liver cancer ([place]) 03/2015   Migraines    Myocardial infarction (HCC)     OSA on CPAP    Seizures ([place])    several as child when sick.  None since age 26   Stroke [facility])    'mini-stroke" 30 yrs ago. no deficits.   T2DM (type 2 diabetes mellitus) ([place])    Wears dentures    full upper and lower    Tobacco History: Social History   Tobacco Use  Smoking Status Former   Packs/day: 2.00   Years: 50.00   Total pack years: 100.00   Types: Cigarettes   Quit date: 2011   Years since quitting: 12.6  Smokeless Tobacco Never   Counseling given: Not Answered   Outpatient Medications Prior to Visit  Medication Sig Dispense Refill   acetaminophen (TYLENOL) 650 MG CR tablet Take 1,300 mg by mouth every 8 (eight) hours.     albuterol (VENTOLIN HFA) 108 (90 Base) MCG/ACT inhaler Inhale 2 puffs into the lungs every 6 (six) hours as needed for wheezing or shortness of breath. 16 g 3   Alcohol Swabs (PHARMACIST CHOICE ALCOHOL) PADS SMARTSIG:1 Each Topical 4 Times Daily     Ascorbic Acid (VITAMIN C) 1000 MG tablet Take 1,000 mg by mouth daily.      aspirin EC 81 MG tablet Take 81 mg by mouth daily. Swallow whole.     atorvastatin (LIPITOR) 80 MG tablet Take 1 tablet (80 mg total) by mouth at bedtime. 90 tablet 4   benazepril (LOTENSIN) 10 MG tablet Take 1 tablet by mouth once daily 90 tablet 2   bisacodyl (DULCOLAX) 5 MG EC tablet Take 2 tablets (10 mg total) by mouth daily in the afternoon. 30 tablet 1   calcium carbonate (OS-CAL) 600 MG TABS tablet Take 600 mg by mouth daily.      cetirizine (ZYRTEC) 10 MG tablet Take 1 tablet (10 mg total) by mouth daily. 90 tablet 4   Cholecalciferol (VITAMIN D3) 25 MCG (1000 UT) CHEW Chew 1,000 Units by mouth daily.      CINNAMON PO Take 1,000 mg by mouth 2 (two) times daily.      clobetasol cream (TEMOVATE) 3.79 % Apply 1 application topically 2 (two) times daily. 30 g 0   COMFORT EZ PEN NEEDLES 32G X 4 MM MISC       Continuous Blood Gluc Sensor (FREESTYLE LIBRE 2 SENSOR) MISC Use as instructed to check blood sugar daily 2 each 2   cyclobenzaprine (FLEXERIL) 10 MG tablet Take 1 tablet (10 mg total) by mouth 3 (three) times daily as needed for muscle spasms. 60 tablet 1   dapagliflozin propanediol (FARXIGA) 10 MG TABS tablet Take 1 tablet (10 mg total) by mouth daily before breakfast. 90 tablet 4   diclofenac Sodium (VOLTAREN) 1 % GEL Apply 2 g topically 4 (four) times daily as needed (pain).      diphenhydrAMINE (BENADRYL) 25 mg capsule Take 50 mg by mouth in the  morning, at noon, and at bedtime.     doxepin (SINEQUAN) 25 MG capsule Take 50 mg by mouth at bedtime.      DULoxetine (CYMBALTA) 60 MG capsule Take 1 capsule (60 mg total) by mouth at bedtime. 90 capsule 4   EPINEPHrine 0.3 mg/0.3 mL IJ SOAJ injection Inject 0.3 mg into the muscle as needed for anaphylaxis. 1 each 12   ezetimibe (ZETIA) 10 MG tablet Take 1 tablet (10 mg total) by mouth daily. 90 tablet 4   famotidine (PEPCID) 20 MG tablet TAKE 1 TABLET BY MOUTH TWICE DAILY 180 tablet 4   Ferrous Gluconate-C-Folic Acid (IRON-C PO) Take 30 mg by mouth daily.     Ginger, Zingiber officinalis, (GINGER PO) Take 1 tablet by mouth daily.     Ginseng 100 MG CAPS Take 100 mg by mouth daily.      Insulin Glargine (BASAGLAR KWIKPEN) 100 UNIT/ML Inject 32 Units into the skin daily. 3 mL 0   Insulin Lispro-aabc (LYUMJEV KWIKPEN) 100 UNIT/ML KwikPen 22 units with Breakfast and continue 16 units with Lunch and 16 units with supper 3 mL 0   isosorbide mononitrate (IMDUR) 60 MG 24 hr tablet Take 1 tablet by mouth once daily 90 tablet 2   lidocaine (XYLOCAINE) 2 % solution Use as directed 15 mLs in the mouth or throat as needed for mouth pain. 100 mL 2   mometasone (ELOCON) 0.1 % ointment Apply topically 2 (two) times daily as needed.     Multiple Vitamins-Minerals (CENTRUM SILVER 50+MEN) TABS Take 1 tablet by mouth daily.     naproxen sodium (ALEVE) 220 MG tablet  Take 440 mg by mouth daily as needed (body aches).     NARCAN 4 MG/0.1ML LIQD nasal spray kit Place 0.4 mg into the nose as needed (opioid overdose).      nitroGLYCERIN (NITROSTAT) 0.4 MG SL tablet Place 0.4 mg under the tongue every 5 (five) minutes as needed for chest pain.      Omega-3 1000 MG CAPS Take 1,000 mg by mouth daily.      ondansetron (ZOFRAN) 4 MG tablet Take 1 tablet (4 mg total) by mouth every 8 (eight) hours as needed for nausea or vomiting. 60 tablet 3   oxyCODONE (ROXICODONE) 15 MG immediate release tablet Take 15 mg by mouth every 4 (four) hours as needed for pain.     pantoprazole (PROTONIX) 40 MG tablet TAKE 1 TABLET(40 MG) BY MOUTH TWICE DAILY 180 tablet 4   Plecanatide (TRULANCE) 3 MG TABS Take 1 tablet by mouth daily. 15 tablet 0   polyethylene glycol (MIRALAX / GLYCOLAX) 17 g packet Take 17 g by mouth 2 (two) times daily. 60 each 12   pregabalin (LYRICA) 100 MG capsule Take 1 capsule (100 mg total) by mouth 2 (two) times daily. 60 capsule 3   senna-docusate (SENOKOT-S) 8.6-50 MG tablet Take 1 tablet by mouth 2 (two) times daily. 180 tablet 4   sucralfate (CARAFATE) 1 g tablet TAKE 1 TABLET BY MOUTH 4 TIMES DAILY WITH MEALS AND AT BEDTIME 360 tablet 4   traMADol (ULTRAM) 50 MG tablet Take 1 tablet (50 mg total) by mouth every 6 (six) hours as needed. 30 tablet 0   traZODone (DESYREL) 100 MG tablet Take 1 tablet (100 mg total) by mouth at bedtime as needed. for sleep 90 tablet 4   vitamin A 10000 UNIT capsule Take 10,000 Units by mouth daily.     vitamin B-12 (CYANOCOBALAMIN) 1000 MCG tablet Take 1,000  mcg by mouth daily.     Vitamin E 400 units TABS Take 400 Units by mouth daily.      Budeson-Glycopyrrol-Formoterol (BREZTRI AEROSPHERE) 160-9-4.8 MCG/ACT AERO Inhale 2 puffs into the lungs in the morning and at bedtime. 10.7 g 3   No facility-administered medications prior to visit.     Review of Systems:   Constitutional: No weight loss or gain, night sweats, fevers,  chills, fatigue, or lassitude. HEENT: No headaches, difficulty swallowing, tooth/dental problems, or sore throat. No sneezing, itching, ear ache, nasal congestion, or post nasal drip CV:  No chest pain, orthopnea, PND, swelling in lower extremities, anasarca, dizziness, palpitations, syncope Resp: +shortness of breath with exertion. No excess mucus or change in color of mucus. No productive or non-productive. No hemoptysis. No wheezing.  No chest wall deformity GU: No dysuria, change in color of urine, urgency or frequency.  No flank pain, no hematuria  MSK:  +left chronic knee pain. No joint swelling.  No decreased range of motion.  No back pain. Neuro: No dizziness or lightheadedness.  Psych: No depression or anxiety. Mood stable.     Physical Exam:  BP 96/66 (BP Location: Right Arm, Patient Position: Sitting, Cuff Size: Large)   Pulse 91   Temp 97.7 F (36.5 C) (Oral)   Ht 5' 8"  (1.727 m)   Wt 247 lb (112 kg)   SpO2 95%   BMI 37.56 kg/m   GEN: Pleasant, interactive; obese; in no acute distress. HEENT:  Normocephalic and atraumatic. PERRLA. Sclera white. Nasal turbinates pink, moist and patent bilaterally. No rhinorrhea present. Oropharynx pink and moist, without exudate or edema. No lesions, ulcerations, or postnasal drip.  NECK:  Supple w/ fair ROM. No JVD present. Normal carotid impulses w/o bruits. Thyroid symmetrical with no goiter or nodules palpated. No lymphadenopathy.   CV: RRR, no m/r/g, no peripheral edema. Pulses intact, +2 bilaterally. No cyanosis, pallor or clubbing. PULMONARY:  Unlabored, regular breathing. Clear bilaterally A&P w/o wheezes/rales/rhonchi. No accessory muscle use. No dullness to percussion. GI: BS present and normoactive. Soft, non-tender to palpation. No organomegaly or masses detected. No CVA tenderness. MSK: No erythema, warmth or tenderness. Cap refil <2 sec all extrem. No deformities or joint swelling noted.  Neuro: A/Ox3. No focal deficits noted.    Skin: Warm, no lesions or rashe Psych: Normal affect and behavior. Judgement and thought content appropriate.     Lab Results:  CBC    Component Value Date/Time   WBC 9.9 03/11/2022 1128   WBC 14.9 (H) 03/05/2022 0603   RBC 4.61 03/11/2022 1128   RBC 4.68 03/05/2022 0603   HGB 12.6 (L) 03/11/2022 1128   HCT 38.8 03/11/2022 1128   PLT 306 03/11/2022 1128   MCV 84 03/11/2022 1128   MCV 92 02/28/2014 1845   MCH 27.3 03/11/2022 1128   MCH 27.1 03/05/2022 0603   MCHC 32.5 03/11/2022 1128   MCHC 31.8 03/05/2022 0603   RDW 14.6 03/11/2022 1128   RDW 13.6 02/28/2014 1845   LYMPHSABS 2.4 03/11/2022 1128   LYMPHSABS 2.6 02/28/2014 1845   MONOABS 1.7 (H) 03/05/2022 0603   MONOABS 0.9 02/28/2014 1845   EOSABS 0.5 (H) 03/11/2022 1128   EOSABS 0.1 02/28/2014 1845   BASOSABS 0.1 03/11/2022 1128   BASOSABS 0.0 02/28/2014 1845    BMET    Component Value Date/Time   NA 135 03/11/2022 1128   NA 136 02/28/2014 1845   K 5.0 03/11/2022 1128   K 4.0 02/28/2014 1845  CL 100 03/11/2022 1128   CL 104 02/28/2014 1845   CO2 20 03/11/2022 1128   CO2 26 02/28/2014 1845   GLUCOSE 213 (H) 03/11/2022 1128   GLUCOSE 331 (H) 03/05/2022 0603   GLUCOSE 105 (H) 02/28/2014 1845   BUN 15 03/11/2022 1128   BUN 13 02/28/2014 1845   CREATININE 0.92 03/11/2022 1128   CREATININE 1.10 12/19/2021 1540   CALCIUM 9.5 03/11/2022 1128   CALCIUM 8.9 02/28/2014 1845   GFRNONAA >60 03/05/2022 0603   GFRNONAA >60 02/28/2014 1845   GFRAA 75 12/06/2020 1554   GFRAA >60 02/28/2014 1845    BNP    Component Value Date/Time   BNP 11 12/19/2021 1540     Imaging:  No results found.        No data to display          No results found for: "NITRICOXIDE"      Assessment & Plan:   Emphysema lung (HCC) High symptom burden; overall stable since previous visit. No recent exacerbation requiring abx or steroids. He was tried on Trelegy before but unable to tolerate DPI. Never picked up rx for  Breztri. Recommended we trial step up given his past improvement with steroids. Provided with 2 samples of Breztri and rx sent to his mail order pharmacy. Advised him to notify if there is a cost barrier or if he does not receive his medication. Continue prn albuterol. Discussed the role of pulmonary rehab; he was agreeable to this plan. Referral placed today.  Patient Instructions  Stop Stiolto. Start Breztri 2 puffs Twice daily. Brush tongue and rinse mouth afterwards  Continue Albuterol inhaler 2 puffs every 6 hours as needed for shortness of breath or wheezing. Notify if symptoms persist despite rescue inhaler/neb use. Continue Zyrtec 1 tab daily for allergies  Continue BiPAP nightly, minimum 4-6 hours   Referral to pulmonary rehab   Follow up in 6 weeks with Dr. Ander Slade. If symptoms do not improve or worsen, please contact office for sooner follow up or seek emergency care.     Sleep apnea Intolerant to CPAP; underwent titration study in April 2023 and required BiPAP 22/18. He was started on auto BiPAP in June. He has had good success with this. Feels as though it works much better for him. He has excellent compliance and receives good benefit. No changes made to therapy.    I spent 35 minutes of dedicated to the care of this patient on the date of this encounter to include pre-visit review of records, face-to-face time with the patient discussing conditions above, post visit ordering of testing, clinical documentation with the electronic health record, making appropriate referrals as documented, and communicating necessary findings to members of the patients care team.  Clayton Bibles, NP 06/10/2022  Pt aware and understands NP's role.

## 2022-06-10 NOTE — Assessment & Plan Note (Signed)
Intolerant to CPAP; underwent titration study in April 2023 and required BiPAP 22/18. He was started on auto BiPAP in June. He has had good success with this. Feels as though it works much better for him. He has excellent compliance and receives good benefit. No changes made to therapy.

## 2022-06-10 NOTE — Assessment & Plan Note (Signed)
High symptom burden; overall stable since previous visit. No recent exacerbation requiring abx or steroids. He was tried on Trelegy before but unable to tolerate DPI. Never picked up rx for Breztri. Recommended we trial step up given his past improvement with steroids. Provided with 2 samples of Breztri and rx sent to his mail order pharmacy. Advised him to notify if there is a cost barrier or if he does not receive his medication. Continue prn albuterol. Discussed the role of pulmonary rehab; he was agreeable to this plan. Referral placed today.  Patient Instructions  Stop Stiolto. Start Breztri 2 puffs Twice daily. Brush tongue and rinse mouth afterwards  Continue Albuterol inhaler 2 puffs every 6 hours as needed for shortness of breath or wheezing. Notify if symptoms persist despite rescue inhaler/neb use. Continue Zyrtec 1 tab daily for allergies  Continue BiPAP nightly, minimum 4-6 hours   Referral to pulmonary rehab   Follow up in 6 weeks with Dr. Ander Slade. If symptoms do not improve or worsen, please contact office for sooner follow up or seek emergency care.

## 2022-06-10 NOTE — Patient Instructions (Signed)
Stop Stiolto. Start Breztri 2 puffs Twice daily. Brush tongue and rinse mouth afterwards  Continue Albuterol inhaler 2 puffs every 6 hours as needed for shortness of breath or wheezing. Notify if symptoms persist despite rescue inhaler/neb use. Continue Zyrtec 1 tab daily for allergies  Continue BiPAP nightly, minimum 4-6 hours   Referral to pulmonary rehab   Follow up in 6 weeks with Dr. Ander Slade. If symptoms do not improve or worsen, please contact office for sooner follow up or seek emergency care.

## 2022-06-11 ENCOUNTER — Encounter: Payer: Self-pay | Admitting: Nurse Practitioner

## 2022-06-13 DIAGNOSIS — E1165 Type 2 diabetes mellitus with hyperglycemia: Secondary | ICD-10-CM | POA: Diagnosis not present

## 2022-06-13 DIAGNOSIS — E1142 Type 2 diabetes mellitus with diabetic polyneuropathy: Secondary | ICD-10-CM | POA: Diagnosis not present

## 2022-06-20 DIAGNOSIS — G4733 Obstructive sleep apnea (adult) (pediatric): Secondary | ICD-10-CM | POA: Diagnosis not present

## 2022-06-24 DIAGNOSIS — M169 Osteoarthritis of hip, unspecified: Secondary | ICD-10-CM | POA: Diagnosis not present

## 2022-06-24 DIAGNOSIS — M179 Osteoarthritis of knee, unspecified: Secondary | ICD-10-CM | POA: Diagnosis not present

## 2022-06-24 DIAGNOSIS — G894 Chronic pain syndrome: Secondary | ICD-10-CM | POA: Diagnosis not present

## 2022-06-24 DIAGNOSIS — M5136 Other intervertebral disc degeneration, lumbar region: Secondary | ICD-10-CM | POA: Diagnosis not present

## 2022-06-24 DIAGNOSIS — G4733 Obstructive sleep apnea (adult) (pediatric): Secondary | ICD-10-CM | POA: Diagnosis not present

## 2022-06-26 ENCOUNTER — Telehealth: Payer: Self-pay

## 2022-06-26 NOTE — Progress Notes (Signed)
Chronic Care Management Pharmacy Assistant   Name: Edward Schneider.  MRN: [id number] DOB: [birth year]  Reason for Encounter: Disease State   Conditions to be addressed/monitored: DMII   Recent office visits:  06/10/22 Clayton Bibles, NP (Centrilobular emphysema) Orders placed: none, Medication changes: Budeson-Gylcopyrrol-Formoterol 160-9-4.8 mcg/act Aero  Recent consult visits:  None since last coordination call on 8/11/823  Hospital visits:  None since last coordination call on 05/29/22  Medications: Outpatient Encounter Medications as of 06/26/2022  Medication Sig Note   acetaminophen (TYLENOL) 650 MG CR tablet Take 1,300 mg by mouth every 8 (eight) hours. 03/05/2022: PRN "body aches"   albuterol (VENTOLIN HFA) 108 (90 Base) MCG/ACT inhaler Inhale 2 puffs into the lungs every 6 (six) hours as needed for wheezing or shortness of breath. 03/05/2022: Takes BID   Alcohol Swabs (PHARMACIST CHOICE ALCOHOL) PADS SMARTSIG:1 Each Topical 4 Times Daily    Ascorbic Acid (VITAMIN C) 1000 MG tablet Take 1,000 mg by mouth daily.     aspirin EC 81 MG tablet Take 81 mg by mouth daily. Swallow whole.    atorvastatin (LIPITOR) 80 MG tablet Take 1 tablet (80 mg total) by mouth at bedtime.    benazepril (LOTENSIN) 10 MG tablet Take 1 tablet by mouth once daily    bisacodyl (DULCOLAX) 5 MG EC tablet Take 2 tablets (10 mg total) by mouth daily in the afternoon.    Budeson-Glycopyrrol-Formoterol (BREZTRI AEROSPHERE) 160-9-4.8 MCG/ACT AERO Inhale 2 puffs into the lungs in the morning and at bedtime.    Budeson-Glycopyrrol-Formoterol (BREZTRI AEROSPHERE) 160-9-4.8 MCG/ACT AERO Inhale 2 puffs into the lungs in the morning and at bedtime.    calcium carbonate (OS-CAL) 600 MG TABS tablet Take 600 mg by mouth daily.     cetirizine (ZYRTEC) 10 MG tablet Take 1 tablet (10 mg total) by mouth daily.    Cholecalciferol (VITAMIN D3) 25 MCG (1000 UT) CHEW Chew 1,000 Units by mouth daily.     CINNAMON PO Take  1,000 mg by mouth 2 (two) times daily.     clobetasol cream (TEMOVATE) 6.29 % Apply 1 application topically 2 (two) times daily. 03/05/2022: Applies to "ant bites" on arms and chest   COMFORT EZ PEN NEEDLES 32G X 4 MM MISC     Continuous Blood Gluc Sensor (FREESTYLE LIBRE 2 SENSOR) MISC Use as instructed to check blood sugar daily    cyclobenzaprine (FLEXERIL) 10 MG tablet Take 1 tablet (10 mg total) by mouth 3 (three) times daily as needed for muscle spasms.    dapagliflozin propanediol (FARXIGA) 10 MG TABS tablet Take 1 tablet (10 mg total) by mouth daily before breakfast.    diclofenac Sodium (VOLTAREN) 1 % GEL Apply 2 g topically 4 (four) times daily as needed (pain).     diphenhydrAMINE (BENADRYL) 25 mg capsule Take 50 mg by mouth in the morning, at noon, and at bedtime. 03/05/2022: Takes 2 tablets TID for as long as he can remember. States he is allergic to everything   doxepin (SINEQUAN) 25 MG capsule Take 50 mg by mouth at bedtime.     DULoxetine (CYMBALTA) 60 MG capsule Take 1 capsule (60 mg total) by mouth at bedtime.    EPINEPHrine 0.3 mg/0.3 mL IJ SOAJ injection Inject 0.3 mg into the muscle as needed for anaphylaxis.    ezetimibe (ZETIA) 10 MG tablet Take 1 tablet (10 mg total) by mouth daily.    famotidine (PEPCID) 20 MG tablet TAKE 1 TABLET BY MOUTH TWICE DAILY  Ferrous Gluconate-C-Folic Acid (IRON-C PO) Take 30 mg by mouth daily.    Ginger, Zingiber officinalis, (GINGER PO) Take 1 tablet by mouth daily.    Ginseng 100 MG CAPS Take 100 mg by mouth daily.     Insulin Glargine (BASAGLAR KWIKPEN) 100 UNIT/ML Inject 32 Units into the skin daily.    Insulin Lispro-aabc (LYUMJEV KWIKPEN) 100 UNIT/ML KwikPen 22 units with Breakfast and continue 16 units with Lunch and 16 units with supper    isosorbide mononitrate (IMDUR) 60 MG 24 hr tablet Take 1 tablet by mouth once daily    lidocaine (XYLOCAINE) 2 % solution Use as directed 15 mLs in the mouth or throat as needed for mouth pain.     mometasone (ELOCON) 0.1 % ointment Apply topically 2 (two) times daily as needed. 03/05/2022: Applies to "ant bites"   Multiple Vitamins-Minerals (CENTRUM SILVER 50+MEN) TABS Take 1 tablet by mouth daily.    naproxen sodium (ALEVE) 220 MG tablet Take 440 mg by mouth daily as needed (body aches).    NARCAN 4 MG/0.1ML LIQD nasal spray kit Place 0.4 mg into the nose as needed (opioid overdose).     nitroGLYCERIN (NITROSTAT) 0.4 MG SL tablet Place 0.4 mg under the tongue every 5 (five) minutes as needed for chest pain.     Omega-3 1000 MG CAPS Take 1,000 mg by mouth daily.     ondansetron (ZOFRAN) 4 MG tablet Take 1 tablet (4 mg total) by mouth every 8 (eight) hours as needed for nausea or vomiting.    oxyCODONE (ROXICODONE) 15 MG immediate release tablet Take 15 mg by mouth every 4 (four) hours as needed for pain.    pantoprazole (PROTONIX) 40 MG tablet TAKE 1 TABLET(40 MG) BY MOUTH TWICE DAILY    Plecanatide (TRULANCE) 3 MG TABS Take 1 tablet by mouth daily.    polyethylene glycol (MIRALAX / GLYCOLAX) 17 g packet Take 17 g by mouth 2 (two) times daily.    pregabalin (LYRICA) 100 MG capsule Take 1 capsule (100 mg total) by mouth 2 (two) times daily.    senna-docusate (SENOKOT-S) 8.6-50 MG tablet Take 1 tablet by mouth 2 (two) times daily.    sucralfate (CARAFATE) 1 g tablet TAKE 1 TABLET BY MOUTH 4 TIMES DAILY WITH MEALS AND AT BEDTIME    traMADol (ULTRAM) 50 MG tablet Take 1 tablet (50 mg total) by mouth every 6 (six) hours as needed.    traZODone (DESYREL) 100 MG tablet Take 1 tablet (100 mg total) by mouth at bedtime as needed. for sleep    vitamin A 10000 UNIT capsule Take 10,000 Units by mouth daily.    vitamin B-12 (CYANOCOBALAMIN) 1000 MCG tablet Take 1,000 mcg by mouth daily.    Vitamin E 400 units TABS Take 400 Units by mouth daily.     No facility-administered encounter medications on file as of 06/26/2022.   Recent Relevant Labs: Lab Results  Component Value Date/Time   HGBA1C 9.8 (H)  03/05/2022 06:03 AM   HGBA1C 8.3 (H) 08/23/2021 04:03 AM   HGBA1C 9.7 (H) 12/10/2020 10:14 AM   HGBA1C 8.9 (H) 09/02/2020 02:40 PM   MICROALBUR 30 (H) 03/05/2021 09:19 AM   MICROALBUR 80 (H) 05/23/2020 08:34 AM    Kidney Function Lab Results  Component Value Date/Time   CREATININE 0.92 03/11/2022 11:28 AM   CREATININE 1.09 03/05/2022 06:03 AM   CREATININE 1.10 12/19/2021 03:40 PM   CREATININE 0.85 02/28/2014 06:45 PM   CREATININE 0.94 10/13/2013 11:42 AM  GFR 66.47 01/24/2019 02:39 PM   GFRNONAA >60 03/05/2022 06:03 AM   GFRNONAA >60 02/28/2014 06:45 PM   GFRAA 75 12/06/2020 03:54 PM   GFRAA >60 02/28/2014 06:45 PM    Current antihyperglycemic regimen:  Farxiga 10 mg 1 tab daily Basaglar 100 unit inject 32 units daily Lyumjev 100 units 22 units with Breakfast and continue 16 units with Lunch and 16 units with supper  What recent interventions/DTPs have been made to improve glycemic control:  Recommend continue to monitor BS QID with [name] and focus heavily on diet and modest weight loss.  Bring Libre to visits. Per Marnee Guarneri 04/17/22  Have there been any recent hospitalizations or ED visits since last visit with CPP? No  Patient denies hypoglycemic symptoms, including None  Patient denies hyperglycemic symptoms, including none  How often are you checking your blood sugar? once daily  What are your blood sugars ranging? Patient states that blood sugar ranges between 138-200 Fasting: 139 before he took shot this morning  During the week, how often does your blood glucose drop below 70?  Patient states that he had one reading below 70 about 6 months ago that got down to 54.   Are you checking your feet daily/regularly? Patient states that he sees a foot doctor for any issues with his feet.  Adherence Review: Is the patient currently on a STATIN medication? Yes, Atorvastatin 80 mg Is the patient currently on ACE/ARB medication? Yes, Benazepril 10 mg Does the patient  have >5 day gap between last estimated fill dates? No    Care Gaps: Colonoscopy-06/18/22 Diabetic Foot Exam-04/17/22-last ordered not done Ophthalmology-03/23/22 Dexa Scan - NA Annual Well Visit - 04/17/22 (Jolene Cannady) Micro albumin-NA Hemoglobin A1c- 03/05/22  Star Rating Drugs: Atorvastatin 80 mg-last fill 05/26/22 90 ds, 02/25/22 90 ds Farxiga 10 mg-last fill 04/17/22 90 ds (Patient stated that he just received a supply this week)  Winter [phone]

## 2022-06-29 ENCOUNTER — Ambulatory Visit: Payer: Self-pay

## 2022-06-29 ENCOUNTER — Ambulatory Visit
Admission: RE | Admit: 2022-06-29 | Discharge: 2022-06-29 | Disposition: A | Discharge status: Home / Self Care | Payer: Medicare Other | Source: Ambulatory Visit | Attending: Nurse Practitioner | Admitting: Nurse Practitioner

## 2022-06-29 ENCOUNTER — Ambulatory Visit
Admission: RE | Admit: 2022-06-29 | Discharge: 2022-06-29 | Disposition: A | Discharge status: Home / Self Care | Payer: Medicare Other | Attending: Nurse Practitioner | Admitting: Nurse Practitioner

## 2022-06-29 ENCOUNTER — Encounter: Payer: Self-pay | Admitting: Nurse Practitioner

## 2022-06-29 ENCOUNTER — Ambulatory Visit (INDEPENDENT_AMBULATORY_CARE_PROVIDER_SITE_OTHER): Payer: Medicare Other | Admitting: Nurse Practitioner

## 2022-06-29 VITALS — BP 118/72 | HR 70 | Temp 98.5°F | Wt 247.8 lb

## 2022-06-29 DIAGNOSIS — L03114 Cellulitis of left upper limb: Secondary | ICD-10-CM | POA: Diagnosis not present

## 2022-06-29 DIAGNOSIS — M25522 Pain in left elbow: Secondary | ICD-10-CM | POA: Diagnosis not present

## 2022-06-29 MED ORDER — SULFAMETHOXAZOLE-TRIMETHOPRIM 800-160 MG PO TABS: 1.0000 | ORAL_TABLET | Freq: Two times a day (BID) | ORAL | 0 refills | Status: DC

## 2022-06-29 NOTE — Progress Notes (Signed)
BP 118/72   Pulse 70   Temp 98.5 F (36.9 C) (Oral)   Wt 247 lb 12.8 oz (112.4 kg)   SpO2 95%   BMI 37.68 kg/m    Subjective:    Patient ID: Edward Flesher., male    DOB: 1956/09/02, 66 y.o.   MRN: [id number]  HPI: Edward Costilla. is a 66 y.o. male  Chief Complaint  Patient presents with   Elbow Pain     Patient reports Saturday he hit his elbow and pus shot out of his elbow. Pt states he was unaware of his elbow being swollen. C/o redness, pain and light discharge with pus and blood.    ELBOW PAIN Patient was treated for bursitis on July 5.  On Saturday, elbow started having drainage, more pain, and redness. Denies fever or SOB.  Relevant past medical, surgical, family and social history reviewed and updated as indicated. Interim medical history since our last visit reviewed. Allergies and medications reviewed and updated.  Review of Systems  Skin:        Left elbow swelling, drainage, pain    Per HPI unless specifically indicated above     Objective:    BP 118/72   Pulse 70   Temp 98.5 F (36.9 C) (Oral)   Wt 247 lb 12.8 oz (112.4 kg)   SpO2 95%   BMI 37.68 kg/m   Wt Readings from Last 3 Encounters:  06/29/22 247 lb 12.8 oz (112.4 kg)  06/10/22 247 lb (112 kg)  04/22/22 250 lb 9.6 oz (113.7 kg)    Physical Exam Vitals and nursing note reviewed.  Constitutional:      General: He is not in acute distress.    Appearance: Normal appearance. He is not ill-appearing, toxic-appearing or diaphoretic.  HENT:     Head: Normocephalic.     Right Ear: External ear normal.     Left Ear: External ear normal.     Nose: Nose normal. No congestion or rhinorrhea.     Mouth/Throat:     Mouth: Mucous membranes are moist.  Eyes:     General:        Right eye: No discharge.        Left eye: No discharge.     Extraocular Movements: Extraocular movements intact.     Conjunctiva/sclera: Conjunctivae normal.     Pupils: Pupils are equal, round, and reactive to light.   Cardiovascular:     Rate and Rhythm: Normal rate and regular rhythm.     Heart sounds: No murmur heard. Pulmonary:     Effort: Pulmonary effort is normal. No respiratory distress.     Breath sounds: Normal breath sounds. No wheezing, rhonchi or rales.  Abdominal:     General: Abdomen is flat. Bowel sounds are normal.  Musculoskeletal:     Cervical back: Normal range of motion and neck supple.  Skin:    General: Skin is warm and dry.     Capillary Refill: Capillary refill takes less than 2 seconds.       Neurological:     General: No focal deficit present.     Mental Status: He is alert and oriented to person, place, and time.  Psychiatric:        Mood and Affect: Mood normal.        Behavior: Behavior normal.        Thought Content: Thought content normal.        Judgment: Judgment normal.  Results for orders placed or performed in visit on 04/22/22  Cytology - non gyn  Result Value Ref Range   CYTOLOGY - NON GYN      CYTOLOGY - NON PAP CASE: [id number] PATIENT: Edward Schneider Non-Gynecological Cytology Report     Clinical History: Olecranon bursitis of left elbow. Specimen Submitted:  A. OLECRANON BURSA, LEFT ELBOW, NEEDLE ASPIRATION:   FINAL MICROSCOPIC DIAGNOSIS: - No malignant cells identified  SPECIMEN ADEQUACY: Satisfactory for evaluation  GROSS: Received is/are 10cc's bloody fluid. (EMH:emh) Prepared: Smears:  0 Concentration Method (Thin Prep):  1 Cell Block:  1 conventional. Additional Studies:  N/A.     Final Diagnosis performed by Tilford Pillar DO.   Electronically signed 04/23/2022 Technical component performed at [employer]. [facility], [place] [address] [place], [place] [address].  Professional component performed at [facility], [place] [address] [place], [place] [address].  Immunohistochemistry Technical component (if applicable) was performed at [facility]. [address], [address], [place], [place] [address].   IMMUNOHISTOCHEMISTRY DISCLAIMER (if applicable): Some of these immunohistochemical stains may have been developed and the performance characteristics determine by [facility]. Some may not have been cleared or approved by the U.S. Food and Drug Administration. The FDA has determined that such clearance or approval is not necessary. This test is used for clinical purposes. It should not be regarded as investigational or for research. This laboratory is certified under the [place] (CLIA-88) as qualified to perform high complexity clinical laboratory testing.  The controls stained appropriately.       Assessment & Plan:   Problem List Items Addressed This Visit   None Visit Diagnoses     Cellulitis of left upper extremity    -  Primary   Will treat with Bactrim. Will obtain xray of elbow to rule out osteomylitis. Will follow up with repeat evaluation with [facility] D in 3 days.   Relevant Orders   DG Elbow Complete Left        Follow up plan: Return in about 4 days (around 07/03/2022) for Cellulitis.

## 2022-06-29 NOTE — Telephone Encounter (Signed)
  Chief Complaint: elbow pain Symptoms: L elbow pain, 6-7/10, swelling with drainage ( pus and blood), redness and warmth Frequency: ongoing since Saturday Pertinent Negatives: NA Disposition: '[]'$ ED /'[]'$ Urgent Care (no appt availability in office) / '[x]'$ Appointment(In office/virtual)/ '[]'$  [facility]/ '[]'$ Home Care/ '[]'$ Refused Recommended Disposition /'[]'$  Mobile Bus/ '[]'$  Follow-up with PCP Additional Notes: pt states he had fluid removed from L elbow back in July and ever since still has swelling, Saturday he was sitting in recliner when it burst open drainage blood and pus, pt states he accidentally hit it yesterday and it burst open again. Scheduled pt appt for today at 1400 with Santiago Glad, NP   Reason for Disposition  [1] Looks infected (spreading redness, pus) AND [2] large red area (> 2 in. or 5 cm)  Answer Assessment - Initial Assessment Questions 1. ONSET: "When did the pain start?"     Saturday, busted with blood and pus  2. LOCATION: "Where is the pain located?"     L elbow 3. PAIN: "How bad is the pain?" (Scale 1-10; or mild, moderate, severe)   - MILD (1-3): doesn't interfere with normal activities.   - MODERATE (4-7): interferes with normal activities (e.g., work or school) or awakens from sleep.   - SEVERE (8-10): excruciating pain, unable to do any normal activities, unable to use arm at all.     6-7/10 6. OTHER SYMPTOMS: "Do you have any other symptoms?" (e.g., neck pain, elbow swelling, rash, fever)     Swelling, warm, redness, and tenderness  Protocols used: Elbow Pain-A-AH

## 2022-06-30 ENCOUNTER — Other Ambulatory Visit: Payer: Self-pay | Admitting: Neurology

## 2022-07-01 NOTE — Progress Notes (Signed)
Please let patient know that his xray was normal.  We will recheck his wound later this week.

## 2022-07-02 ENCOUNTER — Encounter: Payer: Self-pay | Admitting: Nurse Practitioner

## 2022-07-02 ENCOUNTER — Ambulatory Visit (INDEPENDENT_AMBULATORY_CARE_PROVIDER_SITE_OTHER): Payer: Medicare Other | Admitting: Nurse Practitioner

## 2022-07-02 VITALS — BP 121/78 | HR 80 | Temp 97.4°F | Ht 68.0 in | Wt 243.8 lb

## 2022-07-02 DIAGNOSIS — L03114 Cellulitis of left upper limb: Secondary | ICD-10-CM

## 2022-07-02 DIAGNOSIS — D692 Other nonthrombocytopenic purpura: Secondary | ICD-10-CM | POA: Diagnosis not present

## 2022-07-02 DIAGNOSIS — M5442 Lumbago with sciatica, left side: Secondary | ICD-10-CM | POA: Diagnosis not present

## 2022-07-02 DIAGNOSIS — G8929 Other chronic pain: Secondary | ICD-10-CM

## 2022-07-02 DIAGNOSIS — M5441 Lumbago with sciatica, right side: Secondary | ICD-10-CM | POA: Diagnosis not present

## 2022-07-02 DIAGNOSIS — L0390 Cellulitis, unspecified: Secondary | ICD-10-CM | POA: Insufficient documentation

## 2022-07-02 MED ORDER — MUPIROCIN 2 % EX OINT: 1.0000 | TOPICAL_OINTMENT | Freq: Two times a day (BID) | CUTANEOUS | 0 refills | Status: AC

## 2022-07-02 MED ORDER — OXYCODONE HCL 15 MG PO TABS: 15.0000 mg | ORAL_TABLET | ORAL | 0 refills | Status: AC | PRN

## 2022-07-02 NOTE — Progress Notes (Signed)
BP 121/78   Pulse 80   Temp (!) 97.4 F (36.3 C) (Oral)   Ht '5\' 8"'$  (1.727 m)   Wt 243 lb 12.8 oz (110.6 kg)   SpO2 95%   BMI 37.07 kg/m    Subjective:    Patient ID: Edward Schneider., male    DOB: 1956/05/23, 66 y.o.   MRN: [id number]  HPI: Edward Schneider. is a 66 y.o. male  Chief Complaint  Patient presents with   Cellulitis    Patient is here for follow up on Cellulitis of Arm. Patient says his elbow is sore and if he hits it against anything, he notices pus and blood. Patient says he has been taking his medication and he doesn't noticed any difference. Patient says it is hot to touch and he wanted to follow up.    SKIN INFECTION Started to have swelling to elbow on Friday, whole elbow red at time.  Was seen in on 06/29/22 and started on Bactrim.  Imaging was negative.  Treated for epicondylitis to this elbow in July. Duration: days Location: left elbow History of trauma in area: no Pain: yes Quality: yes Severity: 7/10 Redness: yes Swelling: yes Oozing: no Pus: no Fevers: no Nausea/vomiting: no Status: fluctuating Treatments attempted:antibiotics and warm compresses  Tetanus: UTD   CHRONIC PAIN  Followed by Dr. Primus Bravo and currently he is retiring, has been with him 25 years and needs a new pain clinic to take over opioid prescription.  Currently out of pain medication. PDMP last fill 06/03/22. Present dose:  112.5 MG Pain control status: controlled Duration: chronic Location: back to toes Quality: dull, aching, and throbbing Current Pain Level: 7/10 Previous Pain Level: 6/10 Breakthrough pain: no Benefit from narcotic medications: yes What Activities task can be accomplished with current medication? Daily ADLs with medication on board. Interested in weaning off narcotics:no   Stool softners/OTC fiber: yes  Previous pain specialty evaluation: yes Non-narcotic analgesic meds: no Narcotic contract:  with his pain clinic    Relevant past medical, surgical,  family and social history reviewed and updated as indicated. Interim medical history since our last visit reviewed. Allergies and medications reviewed and updated.  Review of Systems  Constitutional: Negative.   Respiratory:  Negative for apnea, cough, chest tightness, shortness of breath and wheezing.   Cardiovascular:  Negative for chest pain, palpitations and leg swelling.  Gastrointestinal: Negative.   Endocrine: Negative.   Skin:  Positive for wound.  Neurological: Negative.   Psychiatric/Behavioral: Negative.      Per HPI unless specifically indicated above     Objective:    BP 121/78   Pulse 80   Temp (!) 97.4 F (36.3 C) (Oral)   Ht '5\' 8"'$  (1.727 m)   Wt 243 lb 12.8 oz (110.6 kg)   SpO2 95%   BMI 37.07 kg/m   Wt Readings from Last 3 Encounters:  07/02/22 243 lb 12.8 oz (110.6 kg)  06/29/22 247 lb 12.8 oz (112.4 kg)  06/10/22 247 lb (112 kg)    Physical Exam Vitals and nursing note reviewed.  Constitutional:      General: He is awake. He is not in acute distress.    Appearance: He is well-developed and well-groomed. He is obese. He is not ill-appearing or toxic-appearing.  HENT:     Head: Normocephalic and atraumatic.     Right Ear: Hearing normal. No drainage.     Left Ear: Hearing normal. No drainage.  Eyes:  General: Lids are normal.        Right eye: No discharge.        Left eye: No discharge.     Conjunctiva/sclera: Conjunctivae normal.     Pupils: Pupils are equal, round, and reactive to light.  Neck:     Thyroid: No thyromegaly.     Vascular: No carotid bruit.     Trachea: Trachea normal.  Cardiovascular:     Rate and Rhythm: Normal rate and regular rhythm.     Heart sounds: Normal heart sounds, S1 normal and S2 normal. No murmur heard.    No gallop.  Pulmonary:     Effort: Pulmonary effort is normal. No accessory muscle usage or respiratory distress.     Breath sounds: Normal breath sounds.  Abdominal:     General: Bowel sounds are normal.  There is no distension.     Palpations: Abdomen is soft.  Musculoskeletal:        General: Normal range of motion.     Cervical back: Normal range of motion and neck supple.     Right lower leg: No edema.     Left lower leg: No edema.  Skin:    General: Skin is warm and dry.     Capillary Refill: Capillary refill takes less than 2 seconds.     Findings: Wound present.          Comments: Scattered pale bruising to upper extremities and abrasions -- baseline.    Neurological:     Mental Status: He is alert and oriented to person, place, and time.     Motor: Motor function is intact.     Coordination: Coordination is intact.     Gait: Gait is intact.     Deep Tendon Reflexes: Reflexes are normal and symmetric.  Psychiatric:        Attention and Perception: Attention normal.        Mood and Affect: Mood normal.        Behavior: Behavior normal. Behavior is cooperative.        Thought Content: Thought content normal.     Results for orders placed or performed in visit on 04/22/22  Cytology - non gyn  Result Value Ref Range   CYTOLOGY - NON GYN      CYTOLOGY - NON PAP CASE: [id number] PATIENT: Edward Schneider Non-Gynecological Cytology Report     Clinical History: Olecranon bursitis of left elbow. Specimen Submitted:  A. OLECRANON BURSA, LEFT ELBOW, NEEDLE ASPIRATION:   FINAL MICROSCOPIC DIAGNOSIS: - No malignant cells identified  SPECIMEN ADEQUACY: Satisfactory for evaluation  GROSS: Received is/are 10cc's bloody fluid. (EMH:emh) Prepared: Smears:  0 Concentration Method (Thin Prep):  1 Cell Block:  1 conventional. Additional Studies:  N/A.     Final Diagnosis performed by Tilford Pillar DO.   Electronically signed 04/23/2022 Technical component performed at [employer]. [facility], [place] [address] [place], [place] [address].  Professional component performed at [facility], [place] [address], [place], [place] [address].   Immunohistochemistry Technical component (if applicable) was performed at [facility]. [address], [place], [place] [address].   IMMUNOHISTOCHEMISTRY DISCLAIMER (if applicable): Some of these immunohistochemical stains may have been developed and the performance characteristics determine by [facility]. Some may not have been cleared or approved by the U.S. Food and Drug Administration. The FDA has determined that such clearance or approval is not necessary. This test is used for clinical  purposes. It should not be regarded as investigational or for research. This laboratory is certified under the [place] (CLIA-88) as qualified to perform high complexity clinical laboratory testing.  The controls stained appropriately.       Assessment & Plan:   Problem List Items Addressed This Visit       Cardiovascular and Mediastinum   Senile purpura ([place])    As evidenced by bruising bilateral upper extremities and ASA use.  Recommend gentle skin care at home and monitor for skin breakdown, if presents immediately notify provider. New dermatology referral placed.      Relevant Orders   Ambulatory referral to Dermatology     Other   Cellulitis    To left elbow, recommend he continue current abx regimen and continue warm compresses 4 times a day.  Will follow-up with him next week and if ongoing change to different therapy.  Mupirocin ointment sent in to apply to abrasion.      Chronic back pain - Primary    Chronic, ongoing, in need of new pain provider as current one is retiring and he is out of pain medication.  Has been with current provider for 25 years and uses medication appropriately. Will bridge him in office until established with new pain clinic.  Refills sent today for 30 day supply Oxycodone IR.  Discussed at length with patient, he is aware our office does not perform chronic pain management,  this is only short period.      Relevant Medications   DULoxetine (CYMBALTA) 30 MG capsule   oxyCODONE (ROXICODONE) 15 MG immediate release tablet   Other Relevant Orders   Ambulatory referral to Pain Clinic     Follow up plan: Return for as scheduled 07/08/22.

## 2022-07-02 NOTE — Assessment & Plan Note (Signed)
As evidenced by bruising bilateral upper extremities and ASA use.  Recommend gentle skin care at home and monitor for skin breakdown, if presents immediately notify provider. New dermatology referral placed.

## 2022-07-02 NOTE — Assessment & Plan Note (Signed)
To left elbow, recommend he continue current abx regimen and continue warm compresses 4 times a day.  Will follow-up with him next week and if ongoing change to different therapy.  Mupirocin ointment sent in to apply to abrasion.

## 2022-07-02 NOTE — Patient Instructions (Signed)
Cellulitis, Adult  Cellulitis is a skin infection. The infected area is often warm, red, swollen, and sore. It occurs most often in the arms and lower legs. It is very important to get treated for this condition. What are the causes? This condition is caused by bacteria. The bacteria enter through a break in the skin, such as a cut, burn, insect bite, open sore, or crack. What increases the risk? This condition is more likely to occur in people who: Have a weak body defense system (immune system). Have open cuts, burns, bites, or scrapes on the skin. Are older than 65 years of age. Have a blood sugar problem (diabetes). Have a long-lasting (chronic) liver disease (cirrhosis) or kidney disease. Are very overweight (obese). Have a skin problem, such as: Itchy rash (eczema). Slow movement of blood in the veins (venous stasis). Fluid buildup below the skin (edema). Have been treated with high-energy rays (radiation). Use IV drugs. What are the signs or symptoms? Symptoms of this condition include: Skin that is: Red. Streaking. Spotting. Swollen. Sore or painful when you touch it. Warm. A fever. Chills. Blisters. How is this diagnosed? This condition is diagnosed based on: Medical history. Physical exam. Blood tests. Imaging tests. How is this treated? Treatment for this condition may include: Medicines to treat infections or allergies. Home care, such as: Rest. Placing cold or warm cloths (compresses) on the skin. Hospital care, if the condition is very bad. Follow these instructions at home: Medicines Take over-the-counter and prescription medicines only as told by your doctor. If you were prescribed an antibiotic medicine, take it as told by your doctor. Do not stop taking it even if you start to feel better. General instructions  Drink enough fluid to keep your pee (urine) pale yellow. Do not touch or rub the infected area. Raise (elevate) the infected area above  the level of your heart while you are sitting or lying down. Place cold or warm cloths on the area as told by your doctor. Keep all follow-up visits as told by your doctor. This is important. Contact a doctor if: You have a fever. You do not start to get better after 1-2 days of treatment. Your bone or joint under the infected area starts to hurt after the skin has healed. Your infection comes back. This can happen in the same area or another area. You have a swollen bump in the area. You have new symptoms. You feel ill and have muscle aches and pains. Get help right away if: Your symptoms get worse. You feel very sleepy. You throw up (vomit) or have watery poop (diarrhea) for a long time. You see red streaks coming from the area. Your red area gets larger. Your red area turns dark in color. These symptoms may represent a serious problem that is an emergency. Do not wait to see if the symptoms will go away. Get medical help right away. Call your local emergency services (911 in the [place] Do not drive yourself to the hospital. Summary Cellulitis is a skin infection. The area is often warm, red, swollen, and sore. This condition is treated with medicines, rest, and cold and warm cloths. Take all medicines only as told by your doctor. Tell your doctor if symptoms do not start to get better after 1-2 days of treatment. This information is not intended to replace advice given to you by your health care provider. Make sure you discuss any questions you have with your health care provider. Document Revised: 07/17/2021 Document   Reviewed: 07/17/2021 Elsevier Patient Education  2023 [employer] Inc[employer]  

## 2022-07-02 NOTE — Assessment & Plan Note (Signed)
Chronic, ongoing, in need of new pain provider as current one is retiring and he is out of pain medication.  Has been with current provider for 25 years and uses medication appropriately. Will bridge him in office until established with new pain clinic.  Refills sent today for 30 day supply Oxycodone IR.  Discussed at length with patient, he is aware our office does not perform chronic pain management, this is only short period.

## 2022-07-05 NOTE — Patient Instructions (Signed)
Diabetes Mellitus and Foot Care Foot care is an important part of your health, especially when you have diabetes. Diabetes may cause you to have problems because of poor blood flow (circulation) to your feet and legs, which can cause your skin to: Become thinner and drier. Break more easily. Heal more slowly. Peel and crack. You may also have nerve damage (neuropathy) in your legs and feet, causing decreased feeling in them. This means that you may not notice minor injuries to your feet that could lead to more serious problems. Noticing and addressing any potential problems early is the best way to prevent future foot problems. How to care for your feet Foot hygiene  Wash your feet daily with warm water and mild soap. Do not use hot water. Then, pat your feet and the areas between your toes until they are completely dry. Do not soak your feet as this can dry your skin. Trim your toenails straight across. Do not dig under them or around the cuticle. File the edges of your nails with an emery board or nail file. Apply a moisturizing lotion or petroleum jelly to the skin on your feet and to dry, brittle toenails. Use lotion that does not contain alcohol and is unscented. Do not apply lotion between your toes. Shoes and socks Wear clean socks or stockings every day. Make sure they are not too tight. Do not wear knee-high stockings since they may decrease blood flow to your legs. Wear shoes that fit properly and have enough cushioning. Always look in your shoes before you put them on to be sure there are no objects inside. To break in new shoes, wear them for just a few hours a day. This prevents injuries on your feet. Wounds, scrapes, corns, and calluses  Check your feet daily for blisters, cuts, bruises, sores, and redness. If you cannot see the bottom of your feet, use a mirror or ask someone for help. Do not cut corns or calluses or try to remove them with medicine. If you find a minor scrape,  cut, or break in the skin on your feet, keep it and the skin around it clean and dry. You may clean these areas with mild soap and water. Do not clean the area with peroxide, alcohol, or iodine. If you have a wound, scrape, corn, or callus on your foot, look at it several times a day to make sure it is healing and not infected. Check for: Redness, swelling, or pain. Fluid or blood. Warmth. Pus or a bad smell. General tips Do not cross your legs. This may decrease blood flow to your feet. Do not use heating pads or hot water bottles on your feet. They may burn your skin. If you have lost feeling in your feet or legs, you may not know this is happening until it is too late. Protect your feet from hot and cold by wearing shoes, such as at the beach or on hot pavement. Schedule a complete foot exam at least once a year (annually) or more often if you have foot problems. Report any cuts, sores, or bruises to your health care provider immediately. Where to find more information American Diabetes Association: www.diabetes.org Association of Diabetes [employer] & Education Specialists: www.diabeteseducator.org Contact a health care provider if: You have a medical condition that increases your risk of infection and you have any cuts, sores, or bruises on your feet. You have an injury that is not healing. You have redness on your legs or feet. You   feel burning or tingling in your legs or feet. You have pain or cramps in your legs and feet. Your legs or feet are numb. Your feet always feel cold. You have pain around any toenails. Get help right away if: You have a wound, scrape, corn, or callus on your foot and: You have pain, swelling, or redness that gets worse. You have fluid or blood coming from the wound, scrape, corn, or callus. Your wound, scrape, corn, or callus feels warm to the touch. You have pus or a bad smell coming from the wound, scrape, corn, or callus. You have a fever. You have a red  line going up your leg. Summary Check your feet every day for blisters, cuts, bruises, sores, and redness. Apply a moisturizing lotion or petroleum jelly to the skin on your feet and to dry, brittle toenails. Wear shoes that fit properly and have enough cushioning. If you have foot problems, report any cuts, sores, or bruises to your health care provider immediately. Schedule a complete foot exam at least once a year (annually) or more often if you have foot problems. This information is not intended to replace advice given to you by your health care provider. Make sure you discuss any questions you have with your health care provider. Document Revised: 04/25/2020 Document Reviewed: 04/25/2020 Elsevier Patient Education  2023 [employer] Inc.  

## 2022-07-08 ENCOUNTER — Ambulatory Visit (INDEPENDENT_AMBULATORY_CARE_PROVIDER_SITE_OTHER): Payer: Medicare Other | Admitting: Nurse Practitioner

## 2022-07-08 ENCOUNTER — Ambulatory Visit (INDEPENDENT_AMBULATORY_CARE_PROVIDER_SITE_OTHER): Payer: Medicare Other

## 2022-07-08 ENCOUNTER — Encounter: Payer: Self-pay | Admitting: Nurse Practitioner

## 2022-07-08 VITALS — BP 127/74 | HR 82 | Temp 98.2°F | Ht 68.0 in | Wt 248.0 lb

## 2022-07-08 DIAGNOSIS — G8929 Other chronic pain: Secondary | ICD-10-CM

## 2022-07-08 DIAGNOSIS — E1169 Type 2 diabetes mellitus with other specified complication: Secondary | ICD-10-CM | POA: Diagnosis not present

## 2022-07-08 DIAGNOSIS — K219 Gastro-esophageal reflux disease without esophagitis: Secondary | ICD-10-CM

## 2022-07-08 DIAGNOSIS — E1165 Type 2 diabetes mellitus with hyperglycemia: Secondary | ICD-10-CM | POA: Diagnosis not present

## 2022-07-08 DIAGNOSIS — E1142 Type 2 diabetes mellitus with diabetic polyneuropathy: Secondary | ICD-10-CM

## 2022-07-08 DIAGNOSIS — I4891 Unspecified atrial fibrillation: Secondary | ICD-10-CM

## 2022-07-08 DIAGNOSIS — L03114 Cellulitis of left upper limb: Secondary | ICD-10-CM

## 2022-07-08 DIAGNOSIS — M5442 Lumbago with sciatica, left side: Secondary | ICD-10-CM

## 2022-07-08 DIAGNOSIS — J432 Centrilobular emphysema: Secondary | ICD-10-CM

## 2022-07-08 DIAGNOSIS — Z0000 Encounter for general adult medical examination without abnormal findings: Secondary | ICD-10-CM | POA: Diagnosis not present

## 2022-07-08 DIAGNOSIS — F321 Major depressive disorder, single episode, moderate: Secondary | ICD-10-CM

## 2022-07-08 DIAGNOSIS — E1159 Type 2 diabetes mellitus with other circulatory complications: Secondary | ICD-10-CM | POA: Diagnosis not present

## 2022-07-08 DIAGNOSIS — Z8719 Personal history of other diseases of the digestive system: Secondary | ICD-10-CM

## 2022-07-08 DIAGNOSIS — E785 Hyperlipidemia, unspecified: Secondary | ICD-10-CM

## 2022-07-08 DIAGNOSIS — I152 Hypertension secondary to endocrine disorders: Secondary | ICD-10-CM | POA: Diagnosis not present

## 2022-07-08 DIAGNOSIS — Z23 Encounter for immunization: Secondary | ICD-10-CM | POA: Diagnosis not present

## 2022-07-08 DIAGNOSIS — I700 Atherosclerosis of aorta: Secondary | ICD-10-CM | POA: Diagnosis not present

## 2022-07-08 DIAGNOSIS — D692 Other nonthrombocytopenic purpura: Secondary | ICD-10-CM | POA: Diagnosis not present

## 2022-07-08 DIAGNOSIS — I25118 Atherosclerotic heart disease of native coronary artery with other forms of angina pectoris: Secondary | ICD-10-CM | POA: Diagnosis not present

## 2022-07-08 DIAGNOSIS — M5441 Lumbago with sciatica, right side: Secondary | ICD-10-CM

## 2022-07-08 DIAGNOSIS — G4733 Obstructive sleep apnea (adult) (pediatric): Secondary | ICD-10-CM

## 2022-07-08 DIAGNOSIS — K8689 Other specified diseases of pancreas: Secondary | ICD-10-CM

## 2022-07-08 LAB — BAYER DCA HB A1C WAIVED: HB A1C (BAYER DCA - WAIVED): 10.4 % — ABNORMAL HIGH (ref 4.8–5.6)

## 2022-07-08 LAB — MICROALBUMIN, URINE WAIVED
Creatinine, Urine Waived: 300 mg/dL (ref 10–300)
Microalb, Ur Waived: 80 mg/L — ABNORMAL HIGH (ref 0–19)

## 2022-07-08 MED ORDER — BENAZEPRIL HCL 10 MG PO TABS: 10.0000 mg | ORAL_TABLET | Freq: Every day | ORAL | 4 refills | Status: DC

## 2022-07-08 MED ORDER — SULFAMETHOXAZOLE-TRIMETHOPRIM 800-160 MG PO TABS: 1.0000 | ORAL_TABLET | Freq: Two times a day (BID) | ORAL | 0 refills | Status: DC

## 2022-07-08 MED ORDER — DOXEPIN HCL 25 MG PO CAPS: 50.0000 mg | ORAL_CAPSULE | Freq: Every day | ORAL | 4 refills | Status: AC

## 2022-07-08 NOTE — Assessment & Plan Note (Signed)
Ongoing.  Noted on CT scan in 2016, continue daily statin and ASA for prevention.  Recommend modest weight loss.

## 2022-07-08 NOTE — Assessment & Plan Note (Signed)
Improving, will extend treatment a little longer to ensure full recovery.

## 2022-07-08 NOTE — Assessment & Plan Note (Signed)
Chronic, ongoing.  Continue current medication regimen and adjust as needed. Lipid panel today. 

## 2022-07-08 NOTE — Assessment & Plan Note (Signed)
Chronic, stable with BP at goal in office and on home readings.  Continue current medication regimen and adjust as needed + continue collaboration with cardiology team.  Recommend he continue to monitor BP closely at home and document + bring levels to visit.  DASH diet at home.  LABS: CMP and Lipid today.

## 2022-07-08 NOTE — Assessment & Plan Note (Signed)
Was noted on past chronic disease list, check Amylase and Lipase today.

## 2022-07-08 NOTE — Assessment & Plan Note (Addendum)
Chronic, ongoing will continue to collaborate with endo.  A1c 10.4% today without Trulicity on board -- alerted endo and will defer changes to them.  Urine ALB 80 September 2023, continue Benazepril for kidney protection.  Continue collaboration with endocrinology and current medication regimen.  Continue collaboration with CCM team.  Recommend continue to monitor BS QID with [name] and focus heavily on diet and modest weight loss.  Bring Libre to visits.

## 2022-07-08 NOTE — Assessment & Plan Note (Addendum)
Chronic, ongoing.  Followed by cardiology.  Continue current medication regimen and collaboration with cardiology.

## 2022-07-08 NOTE — Assessment & Plan Note (Signed)
Chronic, ongoing secondary to chronic diseases and pain.  Continue Duloxetine, which benefits mood and pain, plus Trazodone for sleep.  Refuses psychiatry or therapy referral.  Will continue current regimen and adjust as needed.  If SI presents he is to immediately go to ER, which he agrees with.

## 2022-07-08 NOTE — Assessment & Plan Note (Signed)
Chronic, ongoing will continue to collaborate with endo.  A1c 10.4% today, will defer changes to endo and alert them to this.  Continue Duloxetine and collaboration with pain management + collaboration with endocrinology and current medication regimen. + collaboration with CCM team.  Recommend continue to monitor BS QID with [name] and focus heavily on diet and modest weight loss.

## 2022-07-08 NOTE — Assessment & Plan Note (Signed)
Chronic, ongoing, recommend continue 100% use of CPAP.

## 2022-07-08 NOTE — Patient Instructions (Signed)
Health Maintenance, Male Adopting a healthy lifestyle and getting preventive care are important in promoting health and wellness. Ask your health care provider about: The right schedule for you to have regular tests and exams. Things you can do on your own to prevent diseases and keep yourself healthy. What should I know about diet, weight, and exercise? Eat a healthy diet  Eat a diet that includes plenty of vegetables, fruits, low-fat dairy products, and lean protein. Do not eat a lot of foods that are high in solid fats, added sugars, or sodium. Maintain a healthy weight Body mass index (BMI) is a measurement that can be used to identify possible weight problems. It estimates body fat based on height and weight. Your health care provider can help determine your BMI and help you achieve or maintain a healthy weight. Get regular exercise Get regular exercise. This is one of the most important things you can do for your health. Most adults should: Exercise for at least 150 minutes each week. The exercise should increase your heart rate and make you sweat (moderate-intensity exercise). Do strengthening exercises at least twice a week. This is in addition to the moderate-intensity exercise. Spend less time sitting. Even light physical activity can be beneficial. Watch cholesterol and blood lipids Have your blood tested for lipids and cholesterol at 65 years of age, then have this test every 5 years. You may need to have your cholesterol levels checked more often if: Your lipid or cholesterol levels are high. You are older than 65 years of age. You are at high risk for heart disease. What should I know about cancer screening? Many types of cancers can be detected early and may often be prevented. Depending on your health history and family history, you may need to have cancer screening at various ages. This may include screening for: Colorectal cancer. Prostate cancer. Skin cancer. Lung  cancer. What should I know about heart disease, diabetes, and high blood pressure? Blood pressure and heart disease High blood pressure causes heart disease and increases the risk of stroke. This is more likely to develop in people who have high blood pressure readings or are overweight. Talk with your health care provider about your target blood pressure readings. Have your blood pressure checked: Every 3-5 years if you are 18-39 years of age. Every year if you are 40 years old or older. If you are between the ages of 65 and 75 and are a current or former smoker, ask your health care provider if you should have a one-time screening for abdominal aortic aneurysm (AAA). Diabetes Have regular diabetes screenings. This checks your fasting blood sugar level. Have the screening done: Once every three years after age 45 if you are at a normal weight and have a low risk for diabetes. More often and at a younger age if you are overweight or have a high risk for diabetes. What should I know about preventing infection? Hepatitis B If you have a higher risk for hepatitis B, you should be screened for this virus. Talk with your health care provider to find out if you are at risk for hepatitis B infection. Hepatitis C Blood testing is recommended for: Everyone born from 1945 through 1965. Anyone with known risk factors for hepatitis C. Sexually transmitted infections (STIs) You should be screened each year for STIs, including gonorrhea and chlamydia, if: You are sexually active and are younger than 65 years of age. You are older than 65 years of age and your   health care provider tells you that you are at risk for this type of infection. Your sexual activity has changed since you were last screened, and you are at increased risk for chlamydia or gonorrhea. Ask your health care provider if you are at risk. Ask your health care provider about whether you are at high risk for HIV. Your health care provider  may recommend a prescription medicine to help prevent HIV infection. If you choose to take medicine to prevent HIV, you should first get tested for HIV. You should then be tested every 3 months for as long as you are taking the medicine. Follow these instructions at home: Alcohol use Do not drink alcohol if your health care provider tells you not to drink. If you drink alcohol: Limit how much you have to 0-2 drinks a day. Know how much alcohol is in your drink. In the [place] one drink equals one 12 oz bottle of beer (355 mL), one 5 oz glass of wine (148 mL), or one 1 oz glass of hard liquor (44 mL). Lifestyle Do not use any products that contain nicotine or tobacco. These products include cigarettes, chewing tobacco, and vaping devices, such as e-cigarettes. If you need help quitting, ask your health care provider. Do not use street drugs. Do not share needles. Ask your health care provider for help if you need support or information about quitting drugs. General instructions Schedule regular health, dental, and eye exams. Stay current with your vaccines. Tell your health care provider if: You often feel depressed. You have ever been abused or do not feel safe at home. Summary Adopting a healthy lifestyle and getting preventive care are important in promoting health and wellness. Follow your health care provider's instructions about healthy diet, exercising, and getting tested or screened for diseases. Follow your health care provider's instructions on monitoring your cholesterol and blood pressure. This information is not intended to replace advice given to you by your health care provider. Make sure you discuss any questions you have with your health care provider. Document Revised: 02/24/2021 Document Reviewed: 02/24/2021 Elsevier Patient Education  2023 [employer] Inc.  

## 2022-07-08 NOTE — Assessment & Plan Note (Signed)
Recheck labs today. 

## 2022-07-08 NOTE — Assessment & Plan Note (Signed)
Chronic, ongoing.  Taking daily PPI with benefit.  Check Mag level today.  Risks of PPI use were discussed with patient including bone loss, C. Diff diarrhea, pneumonia, infections, CKD, electrolyte abnormalities.  Pt. verbalizes understanding and chooses to continue the medication.

## 2022-07-08 NOTE — Assessment & Plan Note (Signed)
Chronic, ongoing.  Continue current inhaler regimen, Breztri (which he reports improvement with) and Albuterol.  Continue collaboration with pulmonary.  Look into CCM assist with Breztri, received this with [facility].

## 2022-07-08 NOTE — Assessment & Plan Note (Signed)
As evidenced by bruising bilateral upper extremities and ASA use.  Recommend gentle skin care at home and monitor for skin breakdown, if presents immediately notify provider. New dermatology referral placed.

## 2022-07-08 NOTE — Progress Notes (Signed)
BP 127/74   Pulse 82   Temp 98.2 F (36.8 C) (Oral)   Ht '5\' 8"'$  (1.727 m)   Wt 248 lb (112.5 kg)   SpO2 95%   BMI 37.71 kg/m    Subjective:    Patient ID: Edward Schneider., male    DOB: 09-12-1956, 66 y.o.   MRN: [id number]  HPI: Avel Ogawa. is a 66 y.o. male  Chief Complaint  Patient presents with   Diabetes   Hyperlipidemia   Hypertension   Pain   Benign Prostatic Hypertrophy   Mood   Requesting another round of Bactrim, which was given recently for elbow infection, as he reports this is working and helped his skin as well.  DIABETES Sees endocrinology and last saw 09/04/21 with Dr. Kelton Pillar, reports he is on 6 month visits and returns next month.  Last A1c in hospital 03/05/22 was 9.8%.  Continues on Farxiga with his insulin.  Had to stop Trulicity due to recent pancreatitis.   Hypoglycemic episodes:no Polydipsia/polyuria: no Visual disturbance: no Chest pain: no Paresthesias: no Glucose Monitoring: yes             Accucheck frequency: QID             Fasting glucose: this morning 119 -- staying around 109 to 200             Post prandial:              Evening:              Before meals:  Taking Insulin?: yes             Long acting insulin: Basaglar 32 units             Short acting insulin: Humalog 22 units with breakfast and then 16 with lunch and supper Blood Pressure Monitoring: daily Retinal Examination: Up to Date Foot Exam: Up to Date Pneumovax: Up to Date Influenza: Up to Date Aspirin: yes    HYPERTENSION / HYPERLIPIDEMIA Followed by cardiology and last saw 01/23/22.  Last echo 08/26/21 = EF 60-65% with normal left ventricular diastolic parameters.  Continues Atorvastatin 80 MG + Zetia.  Takes Benazepril 10 MG, Doxepin 25 MG, Imdur, NTG for heart.   Satisfied with current treatment? yes Duration of hypertension: chronic BP monitoring frequency: daily BP range: 110-120/70 BP medication side effects: no Duration of hyperlipidemia:  chronic Cholesterol medication side effects: no Cholesterol supplements: none Medication compliance: good compliance Aspirin: yes Recent stressors: no Recurrent headaches: no Visual changes: no Palpitations: no Dyspnea: no, improved with Breztri Chest pain: no Lower extremity edema: no Dizzy/lightheaded: no  The ASCVD Risk score (Arnett DK, et al., 2019) failed to calculate for the following reasons:   The patient has a prior MI or stroke diagnosis  COPD Continues Breztri and Albuterol -- he reports preferring [employer], academic and works better. Does use CPAP 100% of the time. Smoked for 48 years, was 6-7 when started.  Smoked 3 to 3 1/3 PPD.  Quit in 2012.    Goes for annual lung screening, last went July 2021. Last saw pulmonary on 06/10/22. COPD status: stable Satisfied with current treatment?: yes Oxygen use: no Dyspnea frequency: improved Cough frequency: none Rescue inhaler frequency: minimal Limitation of activity: no Productive cough: none Last Spirometry: with pulmonary Pneumovax: Up to Date Influenza: Up to Date   GERD Taking Protonix daily. GERD control status: stable Satisfied with current treatment? yes Heartburn frequency: none Medication  side effects: no  Medication compliance: stable Dysphagia: no Odynophagia:  no Hematemesis: no Blood in stool: no EGD: yes    CHRONIC PAIN Pain medicine provider, Dr. Primus Bravo, but he has retired and we are bridging him in clinic until can get into new pain clinic -- referral placed last visit. Takes Oxycodone as needed + Lyrica, Duloxetine.   Duration: chronic Mechanism of injury: unknown Location: low back Onset: gradual Severity: 5/10 Quality: sharp and aching Frequency: intermittent Radiation: multiple areas Aggravating factors: lifting, movement, walking and bending Alleviating factors: nothing Status: worse Treatments attempted: Oxycodone & Lyrica Relief with NSAIDs?: No NSAIDs Taken Nighttime pain:  at  times Paresthesias / decreased sensation:  yes Bowel / bladder incontinence:  no Fevers:  no Dysuria / urinary frequency:  no  DEPRESSION Continues on Duloxetine 60 MG daily and Trazodone.  Mood status: stable Satisfied with current treatment?: yes Symptom severity: moderate  Duration of current treatment : chronic Side effects: no Medication compliance: good compliance Psychotherapy/counseling: none Depressed mood: yes Anxious mood: no Anhedonia: no Significant weight loss or gain: no Insomnia: yes hard to fall asleep Fatigue: no Feelings of worthlessness or guilt: yes Impaired concentration/indecisiveness: yes Suicidal ideations: as above Hopelessness: no Crying spells: no    07/08/2022    1:31 PM 07/08/2022    1:30 PM 06/29/2022    1:49 PM 04/17/2022    8:28 AM 03/11/2022   10:54 AM  Depression screen PHQ 2/9  Decreased Interest 0 0 0 0 0  Down, Depressed, Hopeless 0 0 0 0 0  PHQ - 2 Score 0 0 0 0 0  Altered sleeping 0 0 0 0 0  Tired, decreased energy 0 0 0 3 3  Change in appetite 0 0 0 3 3  Feeling bad or failure about yourself  0 0 0 0 0  Trouble concentrating 0 0 0 0 0  Moving slowly or fidgety/restless 0 0 0 0 0  Suicidal thoughts 0 0 0 0 0  PHQ-9 Score 0 0 0 6 6  Difficult doing work/chores Not difficult at all Not difficult at all Very difficult Not difficult at all       06/29/2022    1:49 PM 04/17/2022    8:29 AM 03/11/2022   10:55 AM 12/25/2021   10:03 AM  GAD 7 : Generalized Anxiety Score  Nervous, Anxious, on Edge 0 0 0 0  Control/stop worrying 0 0 0 0  Worry too much - different things 0 0 0 0  Trouble relaxing 0 0 0 2  Restless 0 0 0 0  Easily annoyed or irritable 0 0 0 0  Afraid - awful might happen 0 0 0 0  Total GAD 7 Score 0 0 0 2  Anxiety Difficulty Somewhat difficult Not difficult at all Not difficult at all       Relevant past medical, surgical, family and social history reviewed and updated as indicated. Interim medical history since our  last visit reviewed. Allergies and medications reviewed and updated.  Review of Systems  Constitutional: Negative.   Respiratory:  Negative for apnea, cough, chest tightness, shortness of breath and wheezing.   Cardiovascular:  Negative for chest pain, palpitations and leg swelling.  Gastrointestinal: Negative.   Endocrine: Negative.   Neurological: Negative.   Psychiatric/Behavioral: Negative.      Per HPI unless specifically indicated above     Objective:    BP 127/74   Pulse 82   Temp 98.2 F (36.8 C) (Oral)  Ht '5\' 8"'$  (1.727 m)   Wt 248 lb (112.5 kg)   SpO2 95%   BMI 37.71 kg/m   Wt Readings from Last 3 Encounters:  07/08/22 248 lb (112.5 kg)  07/08/22 248 lb (112.5 kg)  07/02/22 243 lb 12.8 oz (110.6 kg)    Physical Exam Vitals and nursing note reviewed.  Constitutional:      General: He is awake. He is not in acute distress.    Appearance: He is well-developed and well-groomed. He is obese. He is not ill-appearing or toxic-appearing.  HENT:     Head: Normocephalic and atraumatic.     Right Ear: Hearing normal. No drainage.     Left Ear: Hearing normal. No drainage.  Eyes:     General: Lids are normal.        Right eye: No discharge.        Left eye: No discharge.     Conjunctiva/sclera: Conjunctivae normal.     Pupils: Pupils are equal, round, and reactive to light.  Neck:     Thyroid: No thyromegaly.     Vascular: No carotid bruit.     Trachea: Trachea normal.  Cardiovascular:     Rate and Rhythm: Normal rate and regular rhythm.     Heart sounds: Normal heart sounds, S1 normal and S2 normal. No murmur heard.    No gallop.  Pulmonary:     Effort: Pulmonary effort is normal. No accessory muscle usage or respiratory distress.     Breath sounds: Normal breath sounds.  Abdominal:     General: Bowel sounds are normal. There is no distension.     Palpations: Abdomen is soft.  Musculoskeletal:        General: Normal range of motion.     Cervical back:  Normal range of motion and neck supple.     Right lower leg: No edema.     Left lower leg: No edema.  Skin:    General: Skin is warm and dry.     Capillary Refill: Capillary refill takes less than 2 seconds.     Findings: Wound present.          Comments: Scattered pale bruising to upper extremities and abrasions -- baseline.    Neurological:     Mental Status: He is alert and oriented to person, place, and time.     Motor: Motor function is intact.     Coordination: Coordination is intact.     Gait: Gait is intact.     Deep Tendon Reflexes: Reflexes are normal and symmetric.  Psychiatric:        Attention and Perception: Attention normal.        Mood and Affect: Mood normal.        Behavior: Behavior normal. Behavior is cooperative.        Thought Content: Thought content normal.    Diabetic Foot Exam - Simple   Simple Foot Form Visual Inspection No deformities, no ulcerations, no other skin breakdown bilaterally: Yes Sensation Testing Intact to touch and monofilament testing bilaterally: Yes Pulse Check Posterior Tibialis and Dorsalis pulse intact bilaterally: Yes Comments      Results for orders placed or performed in visit on 07/08/22  Bayer DCA Hb A1c Waived  Result Value Ref Range   HB A1C (BAYER DCA - WAIVED) 10.4 (H) 4.8 - 5.6 %  Microalbumin, Urine Waived  Result Value Ref Range   Microalb, Ur Waived 80 (H) 0 - 19 mg/L   Creatinine,  Urine Waived 300 10 - 300 mg/dL   Microalb/Creat Ratio 30-300 (H) <30 mg/g      Assessment & Plan:   Problem List Items Addressed This Visit       Cardiovascular and Mediastinum   Atherosclerosis of abdominal aorta ([place])    Ongoing.  Noted on CT scan in 2016, continue daily statin and ASA for prevention.  Recommend modest weight loss.      Relevant Medications   benazepril (LOTENSIN) 10 MG tablet   Coronary artery disease of native artery of native heart with stable angina pectoris (HCC)    Chronic, ongoing.  Followed by  cardiology.  Continue current medication regimen and collaboration with cardiology.        Relevant Medications   benazepril (LOTENSIN) 10 MG tablet   doxepin (SINEQUAN) 25 MG capsule   Hypertension associated with diabetes ([place])    Chronic, stable with BP at goal in office and on home readings.  Continue current medication regimen and adjust as needed + continue collaboration with cardiology team.  Recommend he continue to monitor BP closely at home and document + bring levels to visit.  DASH diet at home.  LABS: CMP and Lipid today.         Relevant Medications   benazepril (LOTENSIN) 10 MG tablet   Other Relevant Orders   Bayer DCA Hb A1c Waived (Completed)   Comprehensive metabolic panel   Senile purpura ([place])    As evidenced by bruising bilateral upper extremities and ASA use.  Recommend gentle skin care at home and monitor for skin breakdown, if presents immediately notify provider. New dermatology referral placed.      Relevant Medications   benazepril (LOTENSIN) 10 MG tablet     Respiratory   Emphysema lung (HCC)    Chronic, ongoing.  Continue current inhaler regimen, Breztri (which he reports improvement with) and Albuterol.  Continue collaboration with pulmonary.  Look into CCM assist with Breztri, received this with [facility].      Sleep apnea    Chronic, ongoing, recommend continue 100% use of CPAP.        Digestive   GERD (gastroesophageal reflux disease)    Chronic, ongoing.  Taking daily PPI with benefit.  Check Mag level today.  Risks of PPI use were discussed with patient including bone loss, C. Diff diarrhea, pneumonia, infections, CKD, electrolyte abnormalities.  Pt. verbalizes understanding and chooses to continue the medication.       Relevant Orders   Magnesium   Pancreatic insufficiency    Was noted on past chronic disease list, check Amylase and Lipase today.        Endocrine   DM type 2 with diabetic peripheral neuropathy (HCC)    Chronic, ongoing  will continue to collaborate with endo.  A1c 10.4% today, will defer changes to endo and alert them to this.  Continue Duloxetine and collaboration with pain management + collaboration with endocrinology and current medication regimen. + collaboration with CCM team.  Recommend continue to monitor BS QID with [name] and focus heavily on diet and modest weight loss.        Relevant Medications   benazepril (LOTENSIN) 10 MG tablet   doxepin (SINEQUAN) 25 MG capsule   Other Relevant Orders   Bayer DCA Hb A1c Waived (Completed)   Microalbumin, Urine Waived (Completed)   Hyperlipidemia associated with type 2 diabetes mellitus (HCC)    Chronic, ongoing.  Continue current medication regimen and adjust as needed.  Lipid panel today.  Relevant Medications   benazepril (LOTENSIN) 10 MG tablet   Other Relevant Orders   Bayer DCA Hb A1c Waived (Completed)   Lipid Panel w/o Chol/HDL Ratio   Comprehensive metabolic panel   Uncontrolled type 2 diabetes mellitus with hyperglycemia (HCC) - Primary    Chronic, ongoing will continue to collaborate with endo.  A1c 10.4% today without Trulicity on board -- alerted endo and will defer changes to them.  Urine ALB 80 September 2023, continue Benazepril for kidney protection.  Continue collaboration with endocrinology and current medication regimen.  Continue collaboration with CCM team.  Recommend continue to monitor BS QID with [name] and focus heavily on diet and modest weight loss.  Bring Libre to visits.        Relevant Medications   benazepril (LOTENSIN) 10 MG tablet   Other Relevant Orders   Bayer DCA Hb A1c Waived (Completed)   Microalbumin, Urine Waived (Completed)     Other   Cellulitis    Improving, will extend treatment a little longer to ensure full recovery.      Chronic back pain    Chronic, ongoing, in need of new pain provider as current one is retiring and he is out of pain medication.  Has been with current provider for 25 years and  uses medication appropriately. Will bridge him in office until established with new pain clinic.  Refills sent today for 30 day supply Oxycodone IR.  Discussed at length with patient, he is aware our office does not perform chronic pain management, this is only short period.  Check on referral.      Relevant Medications   doxepin (SINEQUAN) 25 MG capsule   Depression, major, single episode, moderate (HCC)    Chronic, ongoing secondary to chronic diseases and pain.  Continue Duloxetine, which benefits mood and pain, plus Trazodone for sleep.  Refuses psychiatry or therapy referral.  Will continue current regimen and adjust as needed.  If SI presents he is to immediately go to ER, which he agrees with.        Relevant Medications   doxepin (SINEQUAN) 25 MG capsule   H/O acute pancreatitis    Recheck labs today.      Relevant Orders   Comprehensive metabolic panel   Amylase   Lipase   Morbid obesity (HCC)    BMI 37.71 with HTN, CAD, T2DM -- can not take GLP1.  Recommended eating smaller high protein, low fat meals more frequently and exercising 30 mins a day 5 times a week with a goal of 10-15lb weight loss in the next 3 months. Patient voiced their understanding and motivation to adhere to these recommendations.       Other Visit Diagnoses     Pneumococcal vaccination given       PCV20 today   Relevant Orders   Pneumococcal conjugate vaccine 20-valent (Prevnar 20) (Completed)   Flu vaccine need       High dose flu today   Relevant Orders   Flu Vaccine QUAD High Dose(Fluad) (Completed)        Follow up plan: Return in about 23 days (around 07/31/2022) for Chronic Pain -- refills.

## 2022-07-08 NOTE — Assessment & Plan Note (Addendum)
BMI 37.71 with HTN, CAD, T2DM -- can not take GLP1.  Recommended eating smaller high protein, low fat meals more frequently and exercising 30 mins a day 5 times a week with a goal of 10-15lb weight loss in the next 3 months. Patient voiced their understanding and motivation to adhere to these recommendations.

## 2022-07-08 NOTE — Progress Notes (Signed)
Subjective:   Edward Schneider. is a 65 y.o. male who presents for Medicare Annual/Subsequent preventive examination.  Review of Systems    Defer to PCP.    Objective:    Today's Vitals   07/08/22 1318 07/08/22 1324  BP: 127/74   Pulse: 82   Temp: 98.2 F (36.8 C)   TempSrc: Oral   SpO2: 95%   Weight: 248 lb (112.5 kg)   Height: 5' 8" (1.727 m)   PainSc: 5  5   PainLoc: Leg    Body mass index is 37.71 kg/m.     03/05/2022    5:35 AM 02/10/2022    8:47 PM 01/05/2022    8:07 AM 01/02/2022    5:20 PM 08/31/2021   10:35 AM 08/22/2021    4:51 PM 08/22/2021   11:47 AM  Advanced Directives  Does Patient Have a Medical Advance Directive? _0  No No  Would patient like information on creating a medical advance directive? No - Patient declined No - Patient declined   No - Patient declined No - Patient declined No - Patient declined    Current Medications (verified) Outpatient Encounter Medications as of 07/08/2022  Medication Sig   acetaminophen (TYLENOL) 650 MG CR tablet Take 1,300 mg by mouth every 8 (eight) hours.   albuterol (VENTOLIN HFA) 108 (90 Base) MCG/ACT inhaler Inhale 2 puffs into the lungs every 6 (six) hours as needed for wheezing or shortness of breath.   Alcohol Swabs (PHARMACIST CHOICE ALCOHOL) PADS SMARTSIG:1 Each Topical 4 Times Daily   Ascorbic Acid (VITAMIN C) 1000 MG tablet Take 1,000 mg by mouth daily.    aspirin EC 81 MG tablet Take 81 mg by mouth daily. Swallow whole.   atorvastatin (LIPITOR) 80 MG tablet Take 1 tablet (80 mg total) by mouth at bedtime.   benazepril (LOTENSIN) 10 MG tablet Take 1 tablet by mouth once daily   bisacodyl (DULCOLAX) 5 MG EC tablet Take 2 tablets (10 mg total) by mouth daily in the afternoon.   Budeson-Glycopyrrol-Formoterol (BREZTRI AEROSPHERE) 160-9-4.8 MCG/ACT AERO Inhale 2 puffs into the lungs in the morning and at bedtime.   calcium carbonate (OS-CAL) 600 MG TABS tablet Take 600 mg by mouth daily.    cetirizine  (ZYRTEC) 10 MG tablet Take 1 tablet (10 mg total) by mouth daily.   Cholecalciferol (VITAMIN D3) 25 MCG (1000 UT) CHEW Chew 1,000 Units by mouth daily.    CINNAMON PO Take 1,000 mg by mouth 2 (two) times daily.    clobetasol cream (TEMOVATE) 2.80 % Apply 1 application topically 2 (two) times daily.   COMFORT EZ PEN NEEDLES 32G X 4 MM MISC    Continuous Blood Gluc Sensor (FREESTYLE LIBRE 2 SENSOR) MISC Use as instructed to check blood sugar daily   cyclobenzaprine (FLEXERIL) 10 MG tablet Take 1 tablet (10 mg total) by mouth 3 (three) times daily as needed for muscle spasms.   dapagliflozin propanediol (FARXIGA) 10 MG TABS tablet Take 1 tablet (10 mg total) by mouth daily before breakfast.   diclofenac Sodium (VOLTAREN) 1 % GEL Apply 2 g topically 4 (four) times daily as needed (pain).    diphenhydrAMINE (BENADRYL) 25 mg capsule Take 50 mg by mouth in the morning, at noon, and at bedtime.   doxepin (SINEQUAN) 25 MG capsule Take 50 mg by mouth at bedtime.    DULoxetine (CYMBALTA) 30 MG capsule Take 30-60 mg by mouth daily as needed.   DULoxetine (CYMBALTA) 60  MG capsule Take 1 capsule (60 mg total) by mouth at bedtime.   EPINEPHrine 0.3 mg/0.3 mL IJ SOAJ injection Inject 0.3 mg into the muscle as needed for anaphylaxis.   ezetimibe (ZETIA) 10 MG tablet Take 1 tablet (10 mg total) by mouth daily.   famotidine (PEPCID) 20 MG tablet TAKE 1 TABLET BY MOUTH TWICE DAILY   Ferrous Gluconate-C-Folic Acid (IRON-C PO) Take 30 mg by mouth daily.   Ginger, Zingiber officinalis, (GINGER PO) Take 1 tablet by mouth daily.   Ginseng 100 MG CAPS Take 100 mg by mouth daily.    Insulin Glargine (BASAGLAR KWIKPEN) 100 UNIT/ML Inject 32 Units into the skin daily.   Insulin Lispro-aabc (LYUMJEV KWIKPEN) 100 UNIT/ML KwikPen 22 units with Breakfast and continue 16 units with Lunch and 16 units with supper   isosorbide mononitrate (IMDUR) 60 MG 24 hr tablet Take 1 tablet by mouth once daily   lidocaine (XYLOCAINE) 2 %  solution Use as directed 15 mLs in the mouth or throat as needed for mouth pain.   mometasone (ELOCON) 0.1 % ointment Apply topically 2 (two) times daily as needed.   Multiple Vitamins-Minerals (CENTRUM SILVER 50+MEN) TABS Take 1 tablet by mouth daily.   mupirocin ointment (BACTROBAN) 2 % Apply 1 Application topically 2 (two) times daily.   naproxen sodium (ALEVE) 220 MG tablet Take 440 mg by mouth daily as needed (body aches).   NARCAN 4 MG/0.1ML LIQD nasal spray kit Place 0.4 mg into the nose as needed (opioid overdose).    nitroGLYCERIN (NITROSTAT) 0.4 MG SL tablet Place 0.4 mg under the tongue every 5 (five) minutes as needed for chest pain.    Omega-3 1000 MG CAPS Take 1,000 mg by mouth daily.    ondansetron (ZOFRAN) 4 MG tablet Take 1 tablet (4 mg total) by mouth every 8 (eight) hours as needed for nausea or vomiting.   oxyCODONE (ROXICODONE) 15 MG immediate release tablet Take 1 tablet (15 mg total) by mouth every 4 (four) hours as needed for pain.   pantoprazole (PROTONIX) 40 MG tablet TAKE 1 TABLET(40 MG) BY MOUTH TWICE DAILY   Plecanatide (TRULANCE) 3 MG TABS Take 1 tablet by mouth daily.   polyethylene glycol (MIRALAX / GLYCOLAX) 17 g packet Take 17 g by mouth 2 (two) times daily.   pregabalin (LYRICA) 100 MG capsule TAKE 1 CAPSULE(100 MG) BY MOUTH TWICE DAILY   senna-docusate (SENOKOT-S) 8.6-50 MG tablet Take 1 tablet by mouth 2 (two) times daily.   sucralfate (CARAFATE) 1 g tablet TAKE 1 TABLET BY MOUTH 4 TIMES DAILY WITH MEALS AND AT BEDTIME   sulfamethoxazole-trimethoprim (BACTRIM DS) 800-160 MG tablet Take 1 tablet by mouth 2 (two) times daily.   traZODone (DESYREL) 100 MG tablet Take 1 tablet (100 mg total) by mouth at bedtime as needed. for sleep   vitamin A 10000 UNIT capsule Take 10,000 Units by mouth daily.   vitamin B-12 (CYANOCOBALAMIN) 1000 MCG tablet Take 1,000 mcg by mouth daily.   Vitamin E 400 units TABS Take 400 Units by mouth daily.    No facility-administered  encounter medications on file as of 07/08/2022.    Allergies (verified) Bee venom, Crestor [rosuvastatin calcium], Fentanyl, Gabapentin, Shellfish allergy, Fire ant, Furosemide, Buprenorphine hcl, Chlorhexidine gluconate, and Simvastatin   History: Past Medical History:  Diagnosis Date   Allergy    Aortic atherosclerosis (HCC)    Asthma    C. difficile diarrhea    Chronic pain    Collagenous colitis  Coronary artery disease    a.) PCI with 2.75 x 18 mm Resolute Onyx DES x 1 to prox/mid LAD on 09/05/2019   DDD (degenerative disc disease), cervical    DDD (degenerative disc disease), lumbar    GERD (gastroesophageal reflux disease)    Grade I diastolic dysfunction    Hepatic steatosis    Hyperlipidemia    Hypertension    Liver cancer ([place]) 03/2015   Migraines    Myocardial infarction (HCC)     OSA on CPAP    Seizures ([place])    several as child when sick.  None since age 28   Stroke [facility])    'mini-stroke" 30 yrs ago. no deficits.   T2DM (type 2 diabetes mellitus) ([place])    Wears dentures    full upper and lower   Past Surgical History:  Procedure Laterality Date   APPENDECTOMY     BACK SURGERY     CARDIAC CATHETERIZATION     No stent placed in his "101's"   CERVICAL FUSION     COLONOSCOPY WITH PROPOFOL N/A 03/06/2016   Procedure: COLONOSCOPY WITH PROPOFOL;  Surgeon: Lucilla Lame, MD;  Location: [place];  Service: Endoscopy;  Laterality: N/A;  requests early   COLONOSCOPY WITH PROPOFOL N/A 06/18/2020   Procedure: COLONOSCOPY WITH PROPOFOL;  Surgeon: Lucilla Lame, MD;  [facility] ENDOSCOPY;  Service: Endoscopy;  Laterality: N/A;   ESOPHAGOGASTRODUODENOSCOPY (EGD) WITH PROPOFOL N/A 09/20/2017   Procedure: ESOPHAGOGASTRODUODENOSCOPY (EGD) WITH PROPOFOL;  Surgeon: Lucilla Lame, MD;  Location: [place];  Service: Endoscopy;  Laterality: N/A;  Diabetic - oral meds   FINGER SURGERY Left    INTRAVASCULAR PRESSURE WIRE/FFR STUDY N/A 09/05/2019    Procedure: INTRAVASCULAR PRESSURE WIRE/FFR STUDY;  Surgeon: Nelva Bush, MD;  Location: [place] CV LAB;  Service: Cardiovascular;  Laterality: N/A;   KNEE SURGERY Right    LEFT HEART CATH AND CORONARY ANGIOGRAPHY Left 09/05/2019   Procedure: LEFT HEART CATH AND CORONARY ANGIOGRAPHY (2.75 x 18 mm Resolute Onyx DES x 1 to prox/mid LAD);  Surgeon: Nelva Bush, MD;  Location: [place] [facility];  Service: Cardiovascular;  Laterality: Left;   NECK SURGERY     spleen surgery     TOE SURGERY Right    TOTAL HIP ARTHROPLASTY Right 06/03/2021   Procedure: TOTAL HIP ARTHROPLASTY ANTERIOR APPROACH;  Surgeon: Hessie Knows, MD;  Location: [facility] ORS;  Service: Orthopedics;  Laterality: Right;   TOTAL KNEE ARTHROPLASTY Right 08/22/2021   Procedure: TOTAL KNEE ARTHROPLASTY;  Surgeon: Hessie Knows, MD;  Location: [facility] ORS;  Service: Orthopedics;  Laterality: Right;   Family History  Problem Relation Age of Onset   Arthritis Mother    Diabetes Mother    Kidney disease Mother    Heart disease Mother    Hypertension Mother    Arthritis Father    Hearing loss Father    Hypertension Father    Heart disease Father    Diabetes Sister    Heart disease Sister    Diabetes Daughter    Diabetes Maternal Aunt    Diabetes Maternal Grandmother    Heart Problems Brother    Heart Problems Brother    Heart Problems Brother    Heart attack Maternal Grandfather    Colon cancer Paternal Grandfather    Social History   Socioeconomic History   Marital status: Married    Spouse name: Not on file   Number of children: Not on file   Years of education: 67.5  Highest education level: Some college, no degree  Occupational History   Occupation: disability   Tobacco Use   Smoking status: Former    Packs/day: 2.00    Years: 50.00    Total pack years: 100.00    Types: Cigarettes    Quit date: 2011    Years since quitting: 12.7   Smokeless tobacco: Never  Vaping Use   Vaping Use: Never used   Substance and Sexual Activity   Alcohol use: No    Alcohol/week: 0.0 standard drinks of alcohol   Drug use: No   Sexual activity: Not on file  Other Topics Concern   Not on file  Social History Narrative   Right handed    Lives with wife   Social Determinants of Health   Financial Resource Strain: Low Risk  (07/08/2022)   Overall Financial Resource Strain (CARDIA)    Difficulty of Paying Living Expenses: Not hard at all  Food Insecurity: No Food Insecurity (07/08/2022)   Hunger Vital Sign    Worried About Running Out of Food in the Last Year: Never true    Ran Out of Food in the Last Year: Never true  Transportation Needs: No Transportation Needs (07/08/2022)   PRAPARE - Hydrologist (Medical): No    Lack of Transportation (Non-Medical): No  Physical Activity: Inactive (07/08/2022)   Exercise Vital Sign    Days of Exercise per Week: 0 days    Minutes of Exercise per Session: 0 min  Stress: No Stress Concern Present (07/08/2022)   [place]    Feeling of Stress : Only a little  Social Connections: Moderately Isolated (07/08/2022)   Social Connection and Isolation Panel [NHANES]    Frequency of Communication with Friends and Family: Once a week    Frequency of Social Gatherings with Friends and Family: Never    Attends Religious Services: More than 4 times per year    Active Member of [employer] or Organizations: No    Attends Music therapist: Never    Marital Status: Married    Tobacco Counseling Counseling given: Not Answered   Clinical Intake:  Pre-visit preparation completed: Yes  Pain : 0-10 Pain Score: 5  Pain Type: Chronic pain Pain Location: Leg  Diabetes: Yes CBG done?: No Did pt. bring in CBG monitor from home?: No  How often do you need to have someone help you when you read instructions, pamphlets, or other written materials from your doctor or  pharmacy?: 1 - Never What is the last grade level you completed in school?: 12th grade, some college  Diabetic- Yes  Interpreter Needed?: No Activities of Daily Living    07/08/2022    1:38 PM 04/17/2022    8:41 AM  In your present state of health, do you have any difficulty performing the following activities:  Hearing? 0 0  Vision? 0 0  Difficulty concentrating or making decisions? 0 0  Walking or climbing stairs? 1 0  Dressing or bathing? 1 0  Doing errands, shopping? 0 0    Patient Care Team: [name], NP as PCP - General (Nurse Practitioner) End, Harrell Gave, MD as PCP - Cardiology (Cardiology) Vladimir Faster, [facility] (Inactive) (Pharmacist) Shamleffer, Melanie Crazier, MD as Consulting Physician (Endocrinology) Alda Berthold, DO as Consulting Physician (Neurology)  Indicate any recent Medical Services you may have received from other than [facility] providers in the past year (date may be approximate).  Assessment:   This is a routine wellness examination for [place].  Hearing/Vision screen No results found.  Dietary issues and exercise activities discussed:     Goals Addressed   None    Depression Screen    07/08/2022    1:31 PM 07/08/2022    1:30 PM 06/29/2022    1:49 PM 04/17/2022    8:28 AM 03/11/2022   10:54 AM 12/25/2021   10:02 AM 10/17/2021   11:01 AM  PHQ 2/9 Scores  PHQ - 2 Score 0 0 0 0 0 0 0  PHQ- 9 Score 0 0 0 _0 Fall Risk    07/08/2022    1:37 PM 06/29/2022    1:49 PM 03/11/2022   10:54 AM 01/05/2022    8:07 AM 07/05/2021   10:52 AM  Fall Risk   Falls in the past year? 0 0 0 0 0  Number falls in past yr: 0 0 0 0 0  Injury with Fall? 0 0 0 0 0  Risk for fall due to : No Fall Risks No Fall Risks No Fall Risks  No Fall Risks  Follow up Falls evaluation completed Falls evaluation completed Falls evaluation completed  Falls evaluation completed    FALL RISK PREVENTION PERTAINING TO THE HOME:  Any stairs in or around the home? No   If so, are there any without handrails? No  Home free of loose throw rugs in walkways, pet beds, electrical cords, etc? Yes  Adequate lighting in your home to reduce risk of falls? Yes   ASSISTIVE DEVICES UTILIZED TO PREVENT FALLS:  Life alert? No  Use of a cane, walker or w/c? No  Grab bars in the bathroom? Yes  Shower chair or bench in shower? Yes  Elevated toilet seat or a handicapped toilet? Yes   TIMED UP AND GO:  Was the test performed? No .    Cognitive Function:        07/08/2022    1:39 PM 07/05/2021   10:59 AM 03/06/2020   11:20 AM 04/27/2019   10:44 AM 04/25/2018    3:09 PM  6CIT Screen  What Year? 0 points 0 points 0 points 0 points 0 points  What month? 0 points 0 points 0 points 0 points 0 points  What time? 0 points 0 points 0 points 0 points 0 points  Count back from 20 0 points 0 points 0 points 0 points 0 points  Months in reverse 0 points 0 points 0 points 0 points 0 points  Repeat phrase 2 points 0 points 0 points 0 points 0 points  Total Score 2 points 0 points 0 points 0 points 0 points    Immunizations Immunization History  Administered Date(s) Administered   Influenza,inj,Quad PF,6+ Mos 08/21/2015, 09/24/2016, 07/12/2017, 06/23/2018, 08/04/2018, 07/17/2020, 07/18/2021   Influenza-Unspecified 07/19/2014, 08/21/2015, 09/24/2016, 07/12/2017, 06/23/2018, 08/04/2018, 07/11/2019, 07/17/2020, 07/18/2021   Pneumococcal Polysaccharide-23 06/07/2012, 05/30/2020   Td 10/28/2007   Td (Adult), 2 Lf Tetanus Toxid, Preservative Free 10/28/2007   Tdap 04/25/2018   Zoster Recombinat (Shingrix) 07/11/2019, 09/13/2019    TDAP status: Up to date  Flu Vaccine status: Completed at today's visit Pneumococcal vaccine status: Due, Education has been provided regarding the importance of this vaccine. Advised may receive this vaccine at local pharmacy or Health Dept. Aware to provide a copy of the vaccination record if obtained from local pharmacy or Health Dept.  Verbalized acceptance and understanding.   Covid-19 vaccine status: Declined, Education  has been provided regarding the importance of this vaccine but patient still declined. Advised may receive this vaccine at local pharmacy or Health Dept.or vaccine clinic. Aware to provide a copy of the vaccination record if obtained from local pharmacy or Health Dept. Verbalized acceptance and understanding.  Qualifies for Shingles Vaccine? Yes   Zostavax completed Yes   Shingrix Completed?: Yes  Screening Tests Health Maintenance  Topic Date Due   Diabetic kidney evaluation - Urine ACR  03/05/2022   INFLUENZA VACCINE  05/19/2022   Pneumonia Vaccine 16+ Years old (2 - PCV) 04/18/2023 (Originally 05/30/2021)   FOOT EXAM  07/18/2022   HEMOGLOBIN A1C  09/05/2022   Diabetic kidney evaluation - GFR measurement  03/12/2023   OPHTHALMOLOGY EXAM  03/24/2023   COLONOSCOPY (Pts 45-26yr Insurance coverage will need to be confirmed)  06/18/2025   TETANUS/TDAP  04/25/2028   HIV Screening  Completed   Zoster Vaccines- Shingrix  Completed   Hepatitis C Screening  Addressed   HPV VACCINES  Aged Out   COVID-19 Vaccine  Discontinued    Health Maintenance  Health Maintenance Due  Topic Date Due   Diabetic kidney evaluation - Urine ACR  03/05/2022   INFLUENZA VACCINE  05/19/2022    Colorectal cancer screening: Type of screening: Colonoscopy. Completed 06/18/20. Repeat every 10 years  Lung Cancer Screening: (Low Dose CT Chest recommended if Age 65-80years, 30 pack-year currently smoking OR have quit w/in 15years.) does not qualify.   Lung Cancer Screening Referral: N/A  Additional Screening:  Hepatitis C Screening: does qualify; Completed 01/24/15  Vision Screening: Recommended annual ophthalmology exams for early detection of glaucoma and other disorders of the eye. Is the patient up to date with their annual eye exam?  Yes  Who is the provider or what is the name of the office in which the patient  attends annual eye exams? [facility] not [facility] with a provider, would they like to be referred to a provider to establish care? No .   Dental Screening: Recommended annual dental exams for proper oral hygiene  Community Resource Referral / Chronic Care Management: CRR required this visit?  No   CCM required this visit?  No      Plan:     I have personally reviewed and noted the following in the patient's chart:   Medical and social history Use of alcohol, tobacco or illicit drugs  Current medications and supplements including opioid prescriptions. Patient is currently taking opioid prescriptions. Information provided to patient regarding non-opioid alternatives. Patient advised to discuss non-opioid treatment plan with their provider. Functional ability and status Nutritional status Physical activity Advanced directives List of other physicians Hospitalizations, surgeries, and ER visits in previous 12 months Vitals Screenings to include cognitive, depression, and falls Referrals and appointments  In addition, I have reviewed and discussed with patient certain preventive protocols, quality metrics, and best practice recommendations. A written personalized care plan for preventive services as well as general preventive health recommendations were provided to patient.     BGeorgina Peer [place]  07/08/2022   Nurse Notes: Face to Face 30 minutes.     Mr. BMarrow, Thank you for taking time to come for your Medicare Wellness Visit. I appreciate your ongoing commitment to your health goals. Please review the following plan we discussed and let me know if I can assist you in the future.   These are the goals we discussed:  Goals  DIET - INCREASE WATER INTAKE      Recommend drinking at least 6-8 glasses of water a day       [place] (see longitudinal plan of care for additional care plan information)  Current  Barriers:  Chronic Disease Management support, education, and care coordination needs related to Hypertension, Hyperlipidemia, Diabetes, Coronary Artery Disease, GERD, COPD, BPH, and Chronic pain     Diabetes Lab Results  Component Value Date/Time   HGBA1C 10.3 (H) 05/29/2020 03:27 PM   HGBA1C 9.5 (H) 05/23/2020 08:33 AM   HGBA1C 9.0 (A) 01/25/2020 09:22 AM   HGBA1C 7.9 (A) 10/26/2019 10:00 AM   HGBA1C 9.3 (H) 08/03/2018 03:35 PM  Pharmacist Clinical Goal(s): Over the next 60 days, patient will work with PharmD and providers to achieve A1c goal <7% Current regimen:  Tresiba 28 units qpm  Novolog 18 units ac breakfast, 16 units ac lunch and dinner Ozempic 0.5 mg weekly Interventions: Called Endocrinology office to help patient obtain meds Will reapply for PAP Provided diet and exercise counseling Reviewed medication refill requests  Reviewed goal glucose readings for an A1c of <7%, we want to see fasting sugars <130 and 2 hour after meal sugars <180.   Patient self care activities - Over the next 60 days, patient will: Check blood sugar 3-4 times daily, document, and provide at future appointments Contact provider with any episodes of hypoglycemia Provide required documentation for PAP Bring glucometer to scheduled appointments Focus on diet and exercise COPD Pharmacist Clinical Goal(s) Over the next 60  days, patient will work with PharmD and providers to optimize therapy and minimize symptoms Current regimen:  Albuterol 2 puffs q6h prn Stiolto 2 puffs daily Interventions: Reviewed inhaler technique Will renew Stiolto PAP Patient self care activities - Over the next 60 days, patient will: Attend all scheduled appointments Provide required portion of PAP documentation  Medication management Pharmacist Clinical Goal(s): Over the next 60 days, patient will work with PharmD and providers to achieve optimal medication adherence Current pharmacy:  [facility] Interventions Comprehensive medication review performed. Continue current medication management strategy Patient self care activities - Over the next 60 days, patient will: Focus on medication adherence by fill dates Take medications as prescribed Report any questions or concerns to PharmD and/or provider(s)  Initial goal documentation       PharmD "I want to stay healthy" (pt-stated)      Current Barriers:  Diabetes: uncontrolled but significantly improved, complicated by chronic medical conditions including atherosclerosis (s/p DES to LAD on 11/17), most recent A1c 7.9%; follows w/ Dr. Kelton Pillar @ [facility] Patient calls today needing help navigating how to get Novolog refill from [facility]. He reports he has ~1.5 pens left (~8 day supply) Current antihyperglycemic regimen: Tresiba 24 units daily, Novolog 16 units TID; Ozempic 0.5 weekly Hx GI intolerance to metformin APPROVED for Tresiba, Novolog, and Ozempic through 09/17/20 Cardiovascular risk reduction- s/p DES on 09/03/2019; follows w/ Dr. Saunders Revel; Current hypertensive regimen: benazepril 10 mg daily, isosorbide 90 mg daily Current hyperlipidemia regimen: atorvastatin 80 mg daily Current antiplatelet regimen: ASA 81 mg daily, clopidogrel 75 mg daily; plan for DAPT x 12 months, then clopidogrel monotherapy indefinitely. COPD; Stiolto 2.5/2.5 mg daily. Albuterol HFA PRN.  APPROVED for Stiolto through patient assistance through 10/18/20 Chronic pain- follows w/ Dr. Primus Bravo Doxepin 25 mg BID, duloxetine 30 mg QAM, 30 mg Qafternoon, 60 mg QPM; pregabalin 50 mg TID, oxycodone IR 15 mg Q4H; cyclobenzaprine  10 mg TID, diclofenac gel PRN  Pharmacist Clinical Goal(s):  Over the next 90 days, patient will work with PharmD and primary care provider to address optimized medication management  Interventions: Comprehensive medication review performed, medication list updated in electronic medical record Inter-disciplinary care team  collaboration (see longitudinal plan of care) Communicated to Dr. Kelton Pillar and CMA that they need to fax Vincent refill fax into [employer] for [facility]. Requested they send a prescription for Novolog into [facility] now. Provided patient with instructions to access an Immediate Supply coupon from [employer] [phone]) while awaiting on his refill.   Patient Self Care Activities:  Patient will check blood glucose BID , document, and provide at future appointments Patient will take medications as prescribed Patient will contact provider with any episodes of hypoglycemia Patient will report any questions or concerns to provider   Please see past updates related to this goal by clicking on the "Past Updates" button in the selected goal          This is a list of the screening recommended for you and due dates:  Health Maintenance  Topic Date Due   Yearly kidney health urinalysis for diabetes  03/05/2022   Flu Shot  05/19/2022   Pneumonia Vaccine (2 - PCV) 04/18/2023*   Complete foot exam   07/18/2022   Hemoglobin A1C  09/05/2022   Yearly kidney function blood test for diabetes  03/12/2023   Eye exam for diabetics  03/24/2023   Colon Cancer Screening  06/18/2025   Tetanus Vaccine  04/25/2028   HIV Screening  Completed   Zoster (Shingles) Vaccine  Completed   Hepatitis C Screening: USPSTF Recommendation to screen - Ages 27-79 yo.  Addressed   HPV Vaccine  Aged Out   COVID-19 Vaccine  Discontinued  *Topic was postponed. The date shown is not the original due date.

## 2022-07-08 NOTE — Assessment & Plan Note (Signed)
Chronic, ongoing, in need of new pain provider as current one is retiring and he is out of pain medication.  Has been with current provider for 25 years and uses medication appropriately. Will bridge him in office until established with new pain clinic.  Refills sent today for 30 day supply Oxycodone IR.  Discussed at length with patient, he is aware our office does not perform chronic pain management, this is only short period.  Check on referral.

## 2022-07-09 LAB — COMPREHENSIVE METABOLIC PANEL
ALT: 23 IU/L (ref 0–44)
AST: 24 IU/L (ref 0–40)
Albumin/Globulin Ratio: 1.4 (ref 1.2–2.2)
Albumin: 4.3 g/dL (ref 3.9–4.9)
Alkaline Phosphatase: 70 IU/L (ref 44–121)
BUN/Creatinine Ratio: 14 (ref 10–24)
BUN: 15 mg/dL (ref 8–27)
Bilirubin Total: 0.4 mg/dL (ref 0.0–1.2)
CO2: 19 mmol/L — ABNORMAL LOW (ref 20–29)
Calcium: 9.7 mg/dL (ref 8.6–10.2)
Chloride: 99 mmol/L (ref 96–106)
Creatinine, Ser: 1.05 mg/dL (ref 0.76–1.27)
Globulin, Total: 3 g/dL (ref 1.5–4.5)
Glucose: 185 mg/dL — ABNORMAL HIGH (ref 70–99)
Potassium: 4.9 mmol/L (ref 3.5–5.2)
Sodium: 135 mmol/L (ref 134–144)
Total Protein: 7.3 g/dL (ref 6.0–8.5)
eGFR: 79 mL/min/{1.73_m2} (ref >59)

## 2022-07-09 LAB — LIPID PANEL W/O CHOL/HDL RATIO
Cholesterol, Total: 119 mg/dL (ref 100–199)
HDL: 47 mg/dL (ref >39)
LDL Chol Calc (NIH): 47 mg/dL (ref 0–99)
Triglycerides: 144 mg/dL (ref 0–149)
VLDL Cholesterol Cal: 25 mg/dL (ref 5–40)

## 2022-07-09 LAB — LIPASE: Lipase: 86 U/L — ABNORMAL HIGH (ref 13–78)

## 2022-07-09 LAB — AMYLASE: Amylase: 79 U/L (ref 31–110)

## 2022-07-09 LAB — MAGNESIUM: Magnesium: 1.8 mg/dL (ref 1.6–2.3)

## 2022-07-09 NOTE — Progress Notes (Signed)
Good afternoon, please let Tilton know his labs have returned and overall remain stable with exception of one of the pancreas labs (lipase) which remains a little elevated but is trending down.  Remainder all look great though, including kidney and liver function:) Keep being amazing!!  Thank you for allowing me to participate in your care.  I appreciate you. Kindest regards, Lorina Duffner

## 2022-07-14 DIAGNOSIS — E1165 Type 2 diabetes mellitus with hyperglycemia: Secondary | ICD-10-CM | POA: Diagnosis not present

## 2022-07-14 DIAGNOSIS — E1142 Type 2 diabetes mellitus with diabetic polyneuropathy: Secondary | ICD-10-CM | POA: Diagnosis not present

## 2022-07-20 ENCOUNTER — Telehealth: Payer: Self-pay | Admitting: Nurse Practitioner

## 2022-07-20 DIAGNOSIS — G4733 Obstructive sleep apnea (adult) (pediatric): Secondary | ICD-10-CM | POA: Diagnosis not present

## 2022-07-20 MED ORDER — SULFAMETHOXAZOLE-TRIMETHOPRIM 800-160 MG PO TABS: 1.0000 | ORAL_TABLET | Freq: Two times a day (BID) | ORAL | 0 refills | Status: DC

## 2022-07-20 NOTE — Telephone Encounter (Signed)
Pt called asking for a refill on the pills he was prescribed for the bumps he breaks out with.  He said he was in about 1 days ago.  He said he finished the medication and cannot remember the name and had threw the bottle out.  He said he is broke out again all on his chest and also had one in his ear that burst last night.  Please advise [phone]

## 2022-07-20 NOTE — Telephone Encounter (Signed)
Spoke with patient and made him aware of Jolene's recommendations. Patient says he is set up for appointment with Dermatology, but says his appointment isn't until May. Patient was advised he may have to reach out to Dermatology to see about an sooner appointment if he continues to have this issue. Patient verbalized understanding and has no further questions.

## 2022-07-21 DIAGNOSIS — M4807 Spinal stenosis, lumbosacral region: Secondary | ICD-10-CM | POA: Diagnosis not present

## 2022-07-21 DIAGNOSIS — M792 Neuralgia and neuritis, unspecified: Secondary | ICD-10-CM | POA: Diagnosis not present

## 2022-07-21 DIAGNOSIS — M48062 Spinal stenosis, lumbar region with neurogenic claudication: Secondary | ICD-10-CM | POA: Diagnosis not present

## 2022-07-21 DIAGNOSIS — M169 Osteoarthritis of hip, unspecified: Secondary | ICD-10-CM | POA: Diagnosis not present

## 2022-07-21 DIAGNOSIS — M9941 Connective tissue stenosis of neural canal of cervical region: Secondary | ICD-10-CM | POA: Diagnosis not present

## 2022-07-21 DIAGNOSIS — M47896 Other spondylosis, lumbar region: Secondary | ICD-10-CM | POA: Diagnosis not present

## 2022-07-21 DIAGNOSIS — M19011 Primary osteoarthritis, right shoulder: Secondary | ICD-10-CM | POA: Diagnosis not present

## 2022-07-21 DIAGNOSIS — M179 Osteoarthritis of knee, unspecified: Secondary | ICD-10-CM | POA: Diagnosis not present

## 2022-07-21 DIAGNOSIS — M47897 Other spondylosis, lumbosacral region: Secondary | ICD-10-CM | POA: Diagnosis not present

## 2022-07-21 DIAGNOSIS — E1142 Type 2 diabetes mellitus with diabetic polyneuropathy: Secondary | ICD-10-CM | POA: Diagnosis not present

## 2022-07-21 DIAGNOSIS — G8929 Other chronic pain: Secondary | ICD-10-CM | POA: Diagnosis not present

## 2022-07-21 DIAGNOSIS — G894 Chronic pain syndrome: Secondary | ICD-10-CM | POA: Diagnosis not present

## 2022-07-21 DIAGNOSIS — M25559 Pain in unspecified hip: Secondary | ICD-10-CM | POA: Diagnosis not present

## 2022-07-21 DIAGNOSIS — G4700 Insomnia, unspecified: Secondary | ICD-10-CM | POA: Diagnosis not present

## 2022-07-21 DIAGNOSIS — M4726 Other spondylosis with radiculopathy, lumbar region: Secondary | ICD-10-CM | POA: Diagnosis not present

## 2022-07-21 DIAGNOSIS — M19012 Primary osteoarthritis, left shoulder: Secondary | ICD-10-CM | POA: Diagnosis not present

## 2022-07-21 DIAGNOSIS — M5136 Other intervertebral disc degeneration, lumbar region: Secondary | ICD-10-CM | POA: Diagnosis not present

## 2022-07-21 DIAGNOSIS — M25519 Pain in unspecified shoulder: Secondary | ICD-10-CM | POA: Diagnosis not present

## 2022-07-21 DIAGNOSIS — M47816 Spondylosis without myelopathy or radiculopathy, lumbar region: Secondary | ICD-10-CM | POA: Diagnosis not present

## 2022-07-21 DIAGNOSIS — M79669 Pain in unspecified lower leg: Secondary | ICD-10-CM | POA: Diagnosis not present

## 2022-07-21 DIAGNOSIS — R609 Edema, unspecified: Secondary | ICD-10-CM | POA: Diagnosis not present

## 2022-07-22 ENCOUNTER — Encounter: Payer: Self-pay | Admitting: Pulmonary Disease

## 2022-07-22 ENCOUNTER — Ambulatory Visit: Payer: Medicare Other | Admitting: Pulmonary Disease

## 2022-07-22 DIAGNOSIS — J432 Centrilobular emphysema: Secondary | ICD-10-CM

## 2022-07-22 MED ORDER — ALBUTEROL SULFATE HFA 108 (90 BASE) MCG/ACT IN AERS: 2.0000 | INHALATION_SPRAY | Freq: Four times a day (QID) | RESPIRATORY_TRACT | 3 refills | Status: DC | PRN

## 2022-07-22 MED ORDER — BREZTRI AEROSPHERE 160-9-4.8 MCG/ACT IN AERO: 2.0000 | INHALATION_SPRAY | Freq: Two times a day (BID) | RESPIRATORY_TRACT | 5 refills | Status: DC

## 2022-07-22 NOTE — Patient Instructions (Signed)
Continue current inhalers  Prescription for Breztri and albuterol sent to pharmacy for you  Call with significant concerns  Will see you 6 months from here
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
## 2022-07-28 ENCOUNTER — Encounter: Payer: Self-pay | Admitting: Physician Assistant

## 2022-07-28 ENCOUNTER — Ambulatory Visit: Payer: Self-pay | Admitting: *Deleted

## 2022-07-28 ENCOUNTER — Ambulatory Visit (INDEPENDENT_AMBULATORY_CARE_PROVIDER_SITE_OTHER): Payer: Medicare Other | Admitting: Physician Assistant

## 2022-07-28 VITALS — BP 101/65 | HR 83 | Temp 98.0°F | Ht 67.99 in | Wt 248.0 lb

## 2022-07-28 DIAGNOSIS — M7022 Olecranon bursitis, left elbow: Secondary | ICD-10-CM | POA: Insufficient documentation

## 2022-07-28 NOTE — Addendum Note (Signed)
Addended by: Talitha Givens on: 07/28/2022 01:06 PM   Modules accepted: Orders

## 2022-07-28 NOTE — Assessment & Plan Note (Addendum)
Acute on chronic concern Patient has had area drained and injected with 1 cc of K40 on 04/22/22, was then seen for cellulitis of the elbow in Sept and started on bactrim with extended course  Xrays were obtained to rule out osteomyelitis He is taking Bactrim and using Mupirocin but swelling, pain and drainage persist- continue abx and current pain management  Will refer to General Surgery for evaluation at this time as conservative measures seem to be failing.  Follow up in 2-4 weeks after surgery consult or sooner if concerns arise

## 2022-07-28 NOTE — Patient Instructions (Addendum)
Please continue taking the antibiotics that were previously prescribed I have placed a referral to General Surgery for them to examine your elbow and evaluate it for drainage. Please keep an ear out for that call so they can schedule you soon  Please let us know if it seems to be getting worse before you see surgery

## 2022-07-28 NOTE — Telephone Encounter (Signed)
Reason for Disposition  [1] SEVERE pain (e.g., excruciating, unable to use hand or wrist at all) AND [2] not improved after 2 hours of pain medicine  [1] Looks infected (spreading redness, pus) AND [2] large red area (> 2 in. or 5 cm)  Answer Assessment - Initial Assessment Questions 1. LOCATION: "Where is the swelling?" (e.g., left, right, both elbows)     Left elbow 2. SIZE and DESCRIPTION: "What does the swelling look like?" (e.g., entire elbow, localized)     Red, swollen 3. ONSET: "When did the swelling start?" "Does it come and go, or is it there all the time?"     Last night 4. SETTING: "Has there been any recent work, exercise or other activity that involved that part of the body?"      Recent drainage 5. AGGRAVATING FACTORS: "What makes the elbow swelling worse?" (e.g., work, sports activities)       6. ASSOCIATED SYMPTOMS: "Is there any pain or redness?"     Redness, heat 7. OTHER SYMPTOMS: "Do you have any other symptoms?" (e.g., fever)        8. PREGNANCY: "Is there any chance you are pregnant?" "When was your last menstrual period?"  Protocols used: Elbow Swelling-A-AH

## 2022-07-28 NOTE — Telephone Encounter (Signed)
  Chief Complaint: left elbow swelling,pain Symptoms: swelling, pain, redness,heat Frequency: started again last night Pertinent Negatives: Patient denies fever Disposition: '[]'$ ED /'[]'$ Urgent Care (no appt availability in office) / '[x]'$ Appointment(In office/virtual)/ '[]'$  [facility]/ '[]'$ Home Care/ '[]'$ Refused Recommended Disposition /'[]'$ [facility]/ '[]'$  Follow-up with PCP Additional Notes: Recent treatment of cellulitis- patient thought it was getting better- but redness and swelling started again- patient did restart penicillin last night

## 2022-07-28 NOTE — Progress Notes (Signed)
Acute Office Visit   Patient: Edward Schneider.   DOB: 04/14/1956   66 y.o. Male  MRN: [id number] Visit Date: 07/28/2022  Today's healthcare provider: Oswaldo Conroy Drevon Plog, PA-C  Introduced myself to the patient as a Secondary school teacher and provided education on APPs in clinical practice.    Chief Complaint  Patient presents with   Elbow Pain    Left elbow swelling, pain, and red   Subjective    HPI HPI     Elbow Pain    Additional comments: Left elbow swelling, pain, and red      Last edited by Sherolyn Buba, CMA on 07/28/2022  9:39 AM.        Left Elbow pain and swelling  He reports swelling and pain in left elbow  Was placed on bactrim on 07/20/22 and is taking this along with mupirocin cream Reports he ran out of the mupirocin for a few days and the swelling to the elbow returned quickly.  He states he thought it was going away but last night he got on bed and had a lot of pain He reports it "popped" this morning and noticed purulent drainage and blood leaking from area Reports he is on abx for the 3 time now   He has been taking 15 mg Oxycodone regularly but this is not helping with the pain  Medications: Outpatient Medications Prior to Visit  Medication Sig   acetaminophen (TYLENOL) 650 MG CR tablet Take 1,300 mg by mouth every 8 (eight) hours.   albuterol (VENTOLIN HFA) 108 (90 Base) MCG/ACT inhaler Inhale 2 puffs into the lungs every 6 (six) hours as needed for wheezing or shortness of breath.   Alcohol Swabs (PHARMACIST CHOICE ALCOHOL) PADS SMARTSIG:1 Each Topical 4 Times Daily   Ascorbic Acid (VITAMIN C) 1000 MG tablet Take 1,000 mg by mouth daily.    aspirin EC 81 MG tablet Take 81 mg by mouth daily. Swallow whole.   atorvastatin (LIPITOR) 80 MG tablet Take 1 tablet (80 mg total) by mouth at bedtime.   benazepril (LOTENSIN) 10 MG tablet Take 1 tablet (10 mg total) by mouth daily.   bisacodyl (DULCOLAX) 5 MG EC tablet Take 2 tablets (10 mg total) by mouth daily in  the afternoon.   Budeson-Glycopyrrol-Formoterol (BREZTRI AEROSPHERE) 160-9-4.8 MCG/ACT AERO Inhale 2 puffs into the lungs in the morning and at bedtime.   calcium carbonate (OS-CAL) 600 MG TABS tablet Take 600 mg by mouth daily.    cetirizine (ZYRTEC) 10 MG tablet Take 1 tablet (10 mg total) by mouth daily.   Cholecalciferol (VITAMIN D3) 25 MCG (1000 UT) CHEW Chew 1,000 Units by mouth daily.    CINNAMON PO Take 1,000 mg by mouth 2 (two) times daily.    clobetasol cream (TEMOVATE) 0.05 % Apply 1 application topically 2 (two) times daily.   COMFORT EZ PEN NEEDLES 32G X 4 MM MISC    Continuous Blood Gluc Sensor (FREESTYLE LIBRE 2 SENSOR) MISC Use as instructed to check blood sugar daily   cyclobenzaprine (FLEXERIL) 10 MG tablet Take 1 tablet (10 mg total) by mouth 3 (three) times daily as needed for muscle spasms.   dapagliflozin propanediol (FARXIGA) 10 MG TABS tablet Take 1 tablet (10 mg total) by mouth daily before breakfast.   diclofenac Sodium (VOLTAREN) 1 % GEL Apply 2 g topically 4 (four) times daily as needed (pain).    diphenhydrAMINE (BENADRYL) 25 mg capsule Take 50 mg by  mouth in the morning, at noon, and at bedtime.   doxepin (SINEQUAN) 25 MG capsule Take 2 capsules (50 mg total) by mouth at bedtime.   DULoxetine (CYMBALTA) 30 MG capsule Take 30-60 mg by mouth daily as needed.   DULoxetine (CYMBALTA) 60 MG capsule Take 1 capsule (60 mg total) by mouth at bedtime.   EPINEPHrine 0.3 mg/0.3 mL IJ SOAJ injection Inject 0.3 mg into the muscle as needed for anaphylaxis.   ezetimibe (ZETIA) 10 MG tablet Take 1 tablet (10 mg total) by mouth daily.   famotidine (PEPCID) 20 MG tablet TAKE 1 TABLET BY MOUTH TWICE DAILY   Ferrous Gluconate-C-Folic Acid (IRON-C PO) Take 30 mg by mouth daily.   Ginger, Zingiber officinalis, (GINGER PO) Take 1 tablet by mouth daily.   Ginseng 100 MG CAPS Take 100 mg by mouth daily.    Insulin Glargine (BASAGLAR KWIKPEN) 100 UNIT/ML Inject 32 Units into the skin  daily.   Insulin Lispro-aabc (LYUMJEV KWIKPEN) 100 UNIT/ML KwikPen 22 units with Breakfast and continue 16 units with Lunch and 16 units with supper   isosorbide mononitrate (IMDUR) 60 MG 24 hr tablet Take 1 tablet by mouth once daily   lidocaine (XYLOCAINE) 2 % solution Use as directed 15 mLs in the mouth or throat as needed for mouth pain.   mometasone (ELOCON) 0.1 % ointment Apply topically 2 (two) times daily as needed.   Multiple Vitamins-Minerals (CENTRUM SILVER 50+MEN) TABS Take 1 tablet by mouth daily.   mupirocin ointment (BACTROBAN) 2 % Apply 1 Application topically 2 (two) times daily.   naproxen sodium (ALEVE) 220 MG tablet Take 440 mg by mouth daily as needed (body aches).   NARCAN 4 MG/0.1ML LIQD nasal spray kit Place 0.4 mg into the nose as needed (opioid overdose).    nitroGLYCERIN (NITROSTAT) 0.4 MG SL tablet Place 0.4 mg under the tongue every 5 (five) minutes as needed for chest pain.    Omega-3 1000 MG CAPS Take 1,000 mg by mouth daily.    ondansetron (ZOFRAN) 4 MG tablet Take 1 tablet (4 mg total) by mouth every 8 (eight) hours as needed for nausea or vomiting.   oxyCODONE (ROXICODONE) 15 MG immediate release tablet Take 1 tablet (15 mg total) by mouth every 4 (four) hours as needed for pain.   pantoprazole (PROTONIX) 40 MG tablet TAKE 1 TABLET(40 MG) BY MOUTH TWICE DAILY   Plecanatide (TRULANCE) 3 MG TABS Take 1 tablet by mouth daily.   polyethylene glycol (MIRALAX / GLYCOLAX) 17 g packet Take 17 g by mouth 2 (two) times daily.   pregabalin (LYRICA) 100 MG capsule TAKE 1 CAPSULE(100 MG) BY MOUTH TWICE DAILY   senna-docusate (SENOKOT-S) 8.6-50 MG tablet Take 1 tablet by mouth 2 (two) times daily.   sucralfate (CARAFATE) 1 g tablet TAKE 1 TABLET BY MOUTH 4 TIMES DAILY WITH MEALS AND AT BEDTIME   sulfamethoxazole-trimethoprim (BACTRIM DS) 800-160 MG tablet Take 1 tablet by mouth 2 (two) times daily.   traZODone (DESYREL) 100 MG tablet Take 1 tablet (100 mg total) by mouth at  bedtime as needed. for sleep   vitamin A 82956 UNIT capsule Take 10,000 Units by mouth daily.   vitamin B-12 (CYANOCOBALAMIN) 1000 MCG tablet Take 1,000 mcg by mouth daily.   Vitamin E 400 units TABS Take 400 Units by mouth daily.    No facility-administered medications prior to visit.    Review of Systems  Musculoskeletal:  Positive for arthralgias and joint swelling.  Objective    BP 101/65   Pulse 83   Temp 98 F (36.7 C) (Oral)   Ht 5' 7.99" (1.727 m)   Wt 248 lb (112.5 kg)   SpO2 95%   BMI 37.72 kg/m    Physical Exam Vitals reviewed.  Constitutional:      General: He is awake.     Appearance: Normal appearance. He is well-developed and well-groomed.  HENT:     Head: Normocephalic and atraumatic.  Musculoskeletal:     Left elbow: Swelling present. Tenderness present in olecranon process.     Comments: Redness and swelling over the olecranon process Actively bleeding and draining   Neurological:     General: No focal deficit present.     Mental Status: He is alert and oriented to person, place, and time.     GCS: GCS eye subscore is 4. GCS verbal subscore is 5. GCS motor subscore is 6.     Cranial Nerves: No dysarthria or facial asymmetry.     Motor: No tremor.  Psychiatric:        Attention and Perception: Attention and perception normal.        Mood and Affect: Mood and affect normal.        Speech: Speech normal.        Behavior: Behavior normal. Behavior is cooperative.        Thought Content: Thought content normal.        Cognition and Memory: Cognition and memory normal.       No results found for any visits on 07/28/22.  Assessment & Plan      No follow-ups on file.       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  Follow up in 2-4 weeks after surgery consult or sooner if concerns arise        Relevant Orders   Ambulatory referral to General Surgery     No follow-ups on file.   I, Valine Drozdowski E [employer], PA-C, have reviewed all documentation for this visit. The documentation on 07/28/22 for the exam, diagnosis, procedures, and orders are all accurate and complete.   Jacquelin Hawking, MHS, PA-C [facility]

## 2022-07-29 ENCOUNTER — Ambulatory Visit
Admission: EM | Admit: 2022-07-29 | Discharge: 2022-07-29 | Disposition: A | Discharge status: Home / Self Care | Payer: Medicare Other

## 2022-07-29 DIAGNOSIS — M71122 Other infective bursitis, left elbow: Secondary | ICD-10-CM

## 2022-07-29 NOTE — ED Triage Notes (Signed)
Patient reports that his elbow has been swelling and is red. Patient reports that his family doctor stated that he may need it lanced so that's why he is here. He reports that his family Dr. Did place him on another round of antibiotic.    patient reports that he noticed it about 2 months ago.

## 2022-07-29 NOTE — Discharge Instructions (Signed)
You need to see an orthopedic surgeon for your recurrent bursitis in yoru elbow.  Please go to the [facility]  [address] [place], [place] [address]

## 2022-07-29 NOTE — ED Provider Notes (Signed)
[facility]    CSN: [id number] Arrival date & time: 07/29/22  7253      History   Chief Complaint Chief Complaint  Patient presents with   Elbow Pain    Left     HPI Buford Bremer. is a 66 y.o. male.   HPI  66 year old male presenting for evaluation of redness and swelling to his left elbow.  Patient reports that he has been experiencing pain, redness, swelling to his left elbow for the last 2 months.  He has been seen by his primary doctor twice for this and had the area drained twice.  He had 1 injection of 40 mg of Kenalog placed on 04/22/2022 and then was prescribed Bactrim and mupirocin for cellulitis.  He saw his PCP yesterday and was referred to general surgery.  He states that the area drains a bloody pus when he squeezes it but was told by his PCP to stop squeezing.  He had a fever initially but he has not had a fever since.  Past Medical History:  Diagnosis Date   Allergy    Aortic atherosclerosis (HCC)    Asthma    C. difficile diarrhea    Chronic pain    Collagenous colitis    Coronary artery disease    a.) PCI with 2.75 x 18 mm Resolute Onyx DES x 1 to prox/mid LAD on 09/05/2019   DDD (degenerative disc disease), cervical    DDD (degenerative disc disease), lumbar    GERD (gastroesophageal reflux disease)    Grade I diastolic dysfunction    Hepatic steatosis    Hyperlipidemia    Hypertension    Liver cancer ([place]) 03/2015   Migraines    Myocardial infarction (HCC)     OSA on CPAP    Seizures ([place])    several as child when sick.  None since age 59   Stroke [facility])    'mini-stroke" 30 yrs ago. no deficits.   T2DM (type 2 diabetes mellitus) ([place])    Wears dentures    full upper and lower    Patient Active Problem List   Diagnosis Date Noted   Olecranon bursitis of left elbow 07/28/2022   Cellulitis 07/02/2022   H/O acute pancreatitis 03/18/2022   Liver disease 03/11/2022   S/P TKR (total knee replacement) using cement, right  08/22/2021   Status post total hip replacement, right 06/03/2021   Senile purpura ([place]) 12/06/2020   Family history of malignant neoplasm of gastrointestinal tract    Depression, major, single episode, moderate ([place]) 03/06/2020   Uncontrolled type 2 diabetes mellitus with hyperglycemia ([place]) 10/29/2019   Coronary artery disease of native artery of native heart with stable angina pectoris ([place]) 08/31/2019   Morbid obesity ([place]) 05/17/2019   BPH (benign prostatic hyperplasia) 04/27/2018   Sleep apnea 02/08/2018   GERD (gastroesophageal reflux disease) 04/04/2017   Advanced care planning/counseling discussion 03/29/2017   DM type 2 with diabetic peripheral neuropathy ([place]) 09/30/2016   Insomnia 12/24/2015   Hyperlipidemia associated with type 2 diabetes mellitus ([place]) 09/23/2015   Bilateral carotid artery stenosis 09/11/2015   Atherosclerosis of abdominal aorta ([place]) 08/26/2015   Migraine headache 08/21/2015   Emphysema lung ([place]) 04/25/2015   Hypertension associated with diabetes ([place]) 04/25/2015   DDD (degenerative disc disease), cervical 03/27/2015   DDD (degenerative disc disease), lumbar 03/27/2015   Cervical post-laminectomy syndrome 03/27/2015   Pancreatic insufficiency 01/28/2015   Chronic back pain 12/24/2014    Past Surgical History:  Procedure  Laterality Date   APPENDECTOMY     BACK SURGERY     CARDIAC CATHETERIZATION     No stent placed in his "36's"   CERVICAL FUSION     COLONOSCOPY WITH PROPOFOL N/A 03/06/2016   Procedure: COLONOSCOPY WITH PROPOFOL;  Surgeon: Lucilla Lame, MD;  Location: [place];  Service: Endoscopy;  Laterality: N/A;  requests early   COLONOSCOPY WITH PROPOFOL N/A 06/18/2020   Procedure: COLONOSCOPY WITH PROPOFOL;  Surgeon: Lucilla Lame, MD;  Location: [facility];  Service: Endoscopy;  Laterality: N/A;   ESOPHAGOGASTRODUODENOSCOPY (EGD) WITH PROPOFOL N/A 09/20/2017   Procedure: ESOPHAGOGASTRODUODENOSCOPY (EGD) WITH PROPOFOL;  Surgeon:  Lucilla Lame, MD;  Location: [place];  Service: Endoscopy;  Laterality: N/A;  Diabetic - oral meds   FINGER SURGERY Left    INTRAVASCULAR PRESSURE WIRE/FFR STUDY N/A 09/05/2019   Procedure: INTRAVASCULAR PRESSURE WIRE/FFR STUDY;  Surgeon: Nelva Bush, MD;  Location: [place] [facility];  Service: Cardiovascular;  Laterality: N/A;   KNEE SURGERY Right    LEFT HEART CATH AND CORONARY ANGIOGRAPHY Left 09/05/2019   Procedure: LEFT HEART CATH AND CORONARY ANGIOGRAPHY (2.75 x 18 mm Resolute Onyx DES x 1 to prox/mid LAD);  Surgeon: Nelva Bush, MD;  Location: [place] [facility];  Service: Cardiovascular;  Laterality: Left;   NECK SURGERY     spleen surgery     TOE SURGERY Right    TOTAL HIP ARTHROPLASTY Right 06/03/2021   Procedure: TOTAL HIP ARTHROPLASTY ANTERIOR APPROACH;  Surgeon: Hessie Knows, MD;  Location: [facility];  Service: Orthopedics;  Laterality: Right;   TOTAL KNEE ARTHROPLASTY Right 08/22/2021   Procedure: TOTAL KNEE ARTHROPLASTY;  Surgeon: Hessie Knows, MD;  Location: [facility];  Service: Orthopedics;  Laterality: Right;       Home Medications    Prior to Admission medications   Medication Sig Start Date End Date Taking? Authorizing Provider  acetaminophen (TYLENOL) 650 MG CR tablet Take 1,300 mg by mouth every 8 (eight) hours.    [provider]  albuterol (VENTOLIN HFA) 108 (90 Base) MCG/ACT inhaler Inhale 2 puffs into the lungs every 6 (six) hours as needed for wheezing or shortness of breath. 07/22/22   Laurin Coder, MD  Alcohol Swabs (PHARMACIST CHOICE ALCOHOL) PADS SMARTSIG:1 Each Topical 4 Times Daily 09/18/21   [provider]  Ascorbic Acid (VITAMIN C) 1000 MG tablet Take 1,000 mg by mouth daily.     [provider]  aspirin EC 81 MG tablet Take 81 mg by mouth daily. Swallow whole.    [provider]  atorvastatin (LIPITOR) 80 MG tablet Take 1 tablet (80 mg total) by mouth at bedtime. 04/17/22   Cannady, Henrine Screws  T, NP  benazepril (LOTENSIN) 10 MG tablet Take 1 tablet (10 mg total) by mouth daily. 07/08/22   Cannady, Henrine Screws T, NP  bisacodyl (DULCOLAX) 5 MG EC tablet Take 2 tablets (10 mg total) by mouth daily in the afternoon. 03/06/22   Fritzi Mandes, MD  Budeson-Glycopyrrol-Formoterol (BREZTRI AEROSPHERE) 160-9-4.8 MCG/ACT AERO Inhale 2 puffs into the lungs in the morning and at bedtime. 07/22/22   Olalere, Cicero Duck A, MD  calcium carbonate (OS-CAL) 600 MG TABS tablet Take 600 mg by mouth daily.     [provider]  cetirizine (ZYRTEC) 10 MG tablet Take 1 tablet (10 mg total) by mouth daily. 04/17/22   Cannady, Henrine Screws T, NP  Cholecalciferol (VITAMIN D3) 25 MCG (1000 UT) CHEW Chew 1,000 Units by mouth daily.     [provider]  CINNAMON PO Take 1,000 mg by mouth 2 (two) times daily.     [provider]  clobetasol cream (TEMOVATE) 0.05 % Apply 1 application topically 2 (two) times daily. 07/17/20   Wicker, Cheryl, NP  COMFORT EZ PEN NEEDLES 32G X 4 MM MISC  09/18/21   [provider]  Continuous Blood Gluc Sensor (FREESTYLE LIBRE 2 SENSOR) MISC Use as instructed to check blood sugar daily 01/16/21   Shamleffer, Ibtehal Jaralla, MD  cyclobenzaprine (FLEXERIL) 10 MG tablet Take 1 tablet (10 mg total) by mouth 3 (three) times daily as needed for muscle spasms. 01/07/18   Wohl, Darren, MD  dapagliflozin propanediol (FARXIGA) 10 MG TABS tablet Take 1 tablet (10 mg total) by mouth daily before breakfast. 04/17/22   Cannady, Jolene T, NP  diclofenac Sodium (VOLTAREN) 1 % GEL Apply 2 g topically 4 (four) times daily as needed (pain).  07/10/20   [provider]  diphenhydrAMINE (BENADRYL) 25 mg capsule Take 50 mg by mouth in the morning, at noon, and at bedtime.    [provider]  doxepin (SINEQUAN) 25 MG capsule Take 2 capsules (50 mg total) by mouth at bedtime. 07/08/22   Cannady, Jolene T, NP  DULoxetine (CYMBALTA) 30 MG capsule Take 30-60 mg by mouth daily as needed.  06/24/22   [provider]  DULoxetine (CYMBALTA) 60 MG capsule Take 1 capsule (60 mg total) by mouth at bedtime. 04/17/22   Cannady, Jolene T, NP  EPINEPHrine 0.3 mg/0.3 mL IJ SOAJ injection Inject 0.3 mg into the muscle as needed for anaphylaxis. 12/12/21   Johnson, Megan P, DO  ezetimibe (ZETIA) 10 MG tablet Take 1 tablet (10 mg total) by mouth daily. 04/17/22   Cannady, Jolene T, NP  famotidine (PEPCID) 20 MG tablet TAKE 1 TABLET BY MOUTH TWICE DAILY 03/04/22   Cannady, Jolene T, NP  Ferrous Gluconate-C-Folic Acid (IRON-C PO) Take 30 mg by mouth daily.    [provider]  Ginger, Zingiber officinalis, (GINGER PO) Take 1 tablet by mouth daily.    [provider]  Ginseng 100 MG CAPS Take 100 mg by mouth daily.     [provider]  Insulin Glargine (BASAGLAR KWIKPEN) 100 UNIT/ML Inject 32 Units into the skin daily. 02/16/22   Shamleffer, Ibtehal Jaralla, MD  Insulin Lispro-aabc (LYUMJEV KWIKPEN) 100 UNIT/ML KwikPen 22 units with Breakfast and continue 16 units with Lunch and 16 units with supper 01/27/22   Shamleffer, Ibtehal Jaralla, MD  isosorbide mononitrate (IMDUR) 60 MG 24 hr tablet Take 1 tablet by mouth once daily 01/10/22   Cannady, Jolene T, NP  lidocaine (XYLOCAINE) 2 % solution Use as directed 15 mLs in the mouth or throat as needed for mouth pain. 12/12/21   Johnson, Megan P, DO  mometasone (ELOCON) 0.1 % ointment Apply topically 2 (two) times daily as needed. 12/23/21   [provider]  Multiple Vitamins-Minerals (CENTRUM SILVER 50+MEN) TABS Take 1 tablet by mouth daily.    [provider]  mupirocin ointment (BACTROBAN) 2 % Apply 1 Application topically 2 (two) times daily. 07/02/22   Cannady, Jolene T, NP  naproxen sodium (ALEVE) 220 MG tablet Take 440 mg by mouth daily as needed (body aches).    [provider]  NARCAN 4 MG/0.1ML LIQD nasal spray kit Place 0.4 mg into the nose as needed (opioid overdose).  02/20/19   [provider]  nitroGLYCERIN (NITROSTAT) 0.4 MG SL tablet Place 0.4 mg under the tongue   every 5 (five) minutes as needed for chest pain.  08/30/19   [provider]  Omega-3 1000 MG CAPS Take 1,000 mg by mouth daily.     [provider]  ondansetron (ZOFRAN) 4 MG tablet Take 1 tablet (4 mg total) by mouth every 8 (eight) hours as needed for nausea or vomiting. 03/11/22   Cannady, Jolene T, NP  oxyCODONE (ROXICODONE) 15 MG immediate release tablet Take 1 tablet (15 mg total) by mouth every 4 (four) hours as needed for pain. 07/02/22 08/01/22  Cannady, Jolene T, NP  pantoprazole (PROTONIX) 40 MG tablet TAKE 1 TABLET(40 MG) BY MOUTH TWICE DAILY 06/01/22   Cannady, Jolene T, NP  Plecanatide (TRULANCE) 3 MG TABS Take 1 tablet by mouth daily. 02/16/22   Wohl, Darren, MD  polyethylene glycol (MIRALAX / GLYCOLAX) 17 g packet Take 17 g by mouth 2 (two) times daily. 03/18/22   Cannady, Jolene T, NP  pregabalin (LYRICA) 100 MG capsule TAKE 1 CAPSULE(100 MG) BY MOUTH TWICE DAILY 06/30/22   Patel, Donika K, DO  senna-docusate (SENOKOT-S) 8.6-50 MG tablet Take 1 tablet by mouth 2 (two) times daily. 03/18/22   Cannady, Jolene T, NP  sucralfate (CARAFATE) 1 g tablet TAKE 1 TABLET BY MOUTH 4 TIMES DAILY WITH MEALS AND AT BEDTIME 10/27/21   Cannady, Jolene T, NP  sulfamethoxazole-trimethoprim (BACTRIM DS) 800-160 MG tablet Take 1 tablet by mouth 2 (two) times daily. 07/20/22   Cannady, Jolene T, NP  traZODone (DESYREL) 100 MG tablet Take 1 tablet (100 mg total) by mouth at bedtime as needed. for sleep 04/17/22   Cannady, Jolene T, NP  vitamin A 10000 UNIT capsule Take 10,000 Units by mouth daily.    [provider]  vitamin B-12 (CYANOCOBALAMIN) 1000 MCG tablet Take 1,000 mcg by mouth daily.    [provider]  Vitamin E 400 units TABS Take 400 Units by mouth daily.     [provider]    Family History Family History  Problem Relation Age of Onset   Arthritis Mother    Diabetes  Mother    Kidney disease Mother    Heart disease Mother    Hypertension Mother    Arthritis Father    Hearing loss Father    Hypertension Father    Heart disease Father    Diabetes Sister    Heart disease Sister    Diabetes Daughter    Diabetes Maternal Aunt    Diabetes Maternal Grandmother    Heart Problems Brother    Heart Problems Brother    Heart Problems Brother    Heart attack Maternal Grandfather    Colon cancer Paternal Grandfather     Social History Social History   Tobacco Use   Smoking status: Former    Packs/day: 2.00    Years: 50.00    Total pack years: 100.00    Types: Cigarettes    Quit date: 2011    Years since quitting: 12.7   Smokeless tobacco: Never  Vaping Use   Vaping Use: Never used  Substance Use Topics   Alcohol use: No    Alcohol/week: 0.0 standard drinks of alcohol   Drug use: No     Allergies   Bee venom, Crestor [rosuvastatin calcium], Fentanyl, Gabapentin, Shellfish allergy, Fire ant, Furosemide, Buprenorphine hcl, Chlorhexidine gluconate, and Simvastatin   Review of Systems Review of Systems  Constitutional:  Negative for fever.  Musculoskeletal:  Positive for arthralgias and joint swelling.  Skin:  Positive for color   change and wound.  Hematological: Negative.   Psychiatric/Behavioral: Negative.       Physical Exam Triage Vital Signs ED Triage Vitals [07/29/22 0937]  Enc Vitals Group     BP      Pulse      Resp      Temp      Temp src      SpO2      Weight 246 lb (111.6 kg)     Height 5' 7" (1.702 m)     Head Circumference      Peak Flow      Pain Score 8     Pain Loc      Pain Edu?      Excl. in GC?    No data found.  Updated Vital Signs BP 112/70 (BP Location: Right Arm)   Pulse 90   Temp 98 F (36.7 C) (Oral)   Ht 5' 7" (1.702 m)   Wt 246 lb (111.6 kg)   SpO2 93%   BMI 38.53 kg/m   Visual Acuity Right Eye Distance:   Left Eye Distance:   Bilateral Distance:    Right Eye Near:   Left Eye  Near:    Bilateral Near:     Physical Exam Vitals and nursing note reviewed.  Constitutional:      Appearance: Normal appearance. He is not ill-appearing.  HENT:     Head: Normocephalic and atraumatic.  Musculoskeletal:        General: Swelling and tenderness present. No deformity or signs of injury. Normal range of motion.  Skin:    General: Skin is warm and dry.     Capillary Refill: Capillary refill takes less than 2 seconds.     Findings: Erythema and lesion present.  Neurological:     General: No focal deficit present.     Mental Status: He is alert and oriented to person, place, and time.     Sensory: No sensory deficit.  Psychiatric:        Mood and Affect: Mood normal.        Behavior: Behavior normal.        Thought Content: Thought content normal.        Judgment: Judgment normal.      UC Treatments / Results  Labs (all labs ordered are listed, but only abnormal results are displayed) Labs Reviewed - No data to display  EKG   Radiology No results found.  Procedures Procedures (including critical care time)  Medications Ordered in UC Medications - No data to display  Initial Impression / Assessment and Plan / UC Course  I have reviewed the triage vital signs and the nursing notes.  Pertinent labs & imaging results that were available during my care of the patient were reviewed by me and considered in my medical decision making (see chart for details).   66-year-old male with a significant past medical history to include emphysema, hyperlipidemia, hypertension, type 2 diabetes, and liver disease presenting for evaluation of 2 months worth of redness, swelling, tenderness, and pain to his left elbow.  This area has been drained twice but continues to recur.  He is taking Bactrim and using mupirocin twice daily.  Patient has erythema, warmth, and fluctuance over the olecranon process with some surrounding erythema of the tissue as depicted in the image above.   The central scabbed area patient reports is from where he had an initial needle aspiration and steroid injection.  The superior medial lesion is   a resolving blister that developed subsequently.  He has full range of motion of his elbow the area is slightly tender to touch.  Given patient's recurrent cellulitis and edema despite antibiotics and aspiration I feel he needs to be evaluated by an orthopedist.  I have given him the address for [facility] in [place] and he will go to the urgent care now.  I advised him that I would not open that lesion in office as it may require surgical excision.  I suspect he might have septic bursitis.   Final Clinical Impressions(s) / UC Diagnoses   Final diagnoses:  Septic bursitis of elbow, left     Discharge Instructions      You need to see an orthopedic surgeon for your recurrent bursitis in yoru elbow.  Please go to the Orthopedic Urgent Care on [facility].  [address], [place], [place] [address]     ED Prescriptions   None    PDMP not reviewed this encounter.   Margarette Canada, NP 07/29/22 1004

## 2022-07-30 ENCOUNTER — Telehealth: Payer: Self-pay

## 2022-07-30 DIAGNOSIS — M71122 Other infective bursitis, left elbow: Secondary | ICD-10-CM | POA: Diagnosis not present

## 2022-07-30 NOTE — Progress Notes (Signed)
Chronic Care Management Pharmacy Assistant   Name: Denise Bramblett.  MRN: [id number] DOB: 15-Mar-1956   Reason for Encounter: Disease State   Conditions to be addressed/monitored: DMII   Recent office visits:  07/28/22 Mecum, Dani Gobble, PA-C (Elbow pain) Orders placed: Ambulatory referral to General Surgery, Ambulatory referral to Orthopedic Surgery, Medication changes: none  Recent consult visits:  07/22/22 Laurin Coder, MD-Pulmonary disease (Centrilobular emphysema) Orders placed: none, Medication [facility] visits:  Medication Reconciliation was completed by comparing discharge summary, patient's EMR and Pharmacy list, and upon discussion with patient.  Admitted to the hospital on 07/29/22 due to septic bursitis. Discharge date was 07/29/22. Discharged from [facility].      Medications that remain the same after Hospital Discharge:??  -All other medications will remain the same.    Medications: Outpatient Encounter Medications as of 07/30/2022  Medication Sig Note   acetaminophen (TYLENOL) 650 MG CR tablet Take 1,300 mg by mouth every 8 (eight) hours. 03/05/2022: PRN "body aches"   albuterol (VENTOLIN HFA) 108 (90 Base) MCG/ACT inhaler Inhale 2 puffs into the lungs every 6 (six) hours as needed for wheezing or shortness of breath.    Alcohol Swabs (PHARMACIST CHOICE ALCOHOL) PADS SMARTSIG:1 Each Topical 4 Times Daily    Ascorbic Acid (VITAMIN C) 1000 MG tablet Take 1,000 mg by mouth daily.     aspirin EC 81 MG tablet Take 81 mg by mouth daily. Swallow whole.    atorvastatin (LIPITOR) 80 MG tablet Take 1 tablet (80 mg total) by mouth at bedtime.    benazepril (LOTENSIN) 10 MG tablet Take 1 tablet (10 mg total) by mouth daily.    bisacodyl (DULCOLAX) 5 MG EC tablet Take 2 tablets (10 mg total) by mouth daily in the afternoon.    Budeson-Glycopyrrol-Formoterol (BREZTRI AEROSPHERE) 160-9-4.8 MCG/ACT AERO Inhale 2 puffs into the lungs in the  morning and at bedtime.    calcium carbonate (OS-CAL) 600 MG TABS tablet Take 600 mg by mouth daily.     cetirizine (ZYRTEC) 10 MG tablet Take 1 tablet (10 mg total) by mouth daily.    Cholecalciferol (VITAMIN D3) 25 MCG (1000 UT) CHEW Chew 1,000 Units by mouth daily.     CINNAMON PO Take 1,000 mg by mouth 2 (two) times daily.     clobetasol cream (TEMOVATE) 1.11 % Apply 1 application topically 2 (two) times daily. 03/05/2022: Applies to "ant bites" on arms and chest   COMFORT EZ PEN NEEDLES 32G X 4 MM MISC     Continuous Blood Gluc Sensor (FREESTYLE LIBRE 2 SENSOR) MISC Use as instructed to check blood sugar daily    cyclobenzaprine (FLEXERIL) 10 MG tablet Take 1 tablet (10 mg total) by mouth 3 (three) times daily as needed for muscle spasms.    dapagliflozin propanediol (FARXIGA) 10 MG TABS tablet Take 1 tablet (10 mg total) by mouth daily before breakfast.    diclofenac Sodium (VOLTAREN) 1 % GEL Apply 2 g topically 4 (four) times daily as needed (pain).     diphenhydrAMINE (BENADRYL) 25 mg capsule Take 50 mg by mouth in the morning, at noon, and at bedtime. 03/05/2022: Takes 2 tablets TID for as long as he can remember. States he is allergic to everything   doxepin (SINEQUAN) 25 MG capsule Take 2 capsules (50 mg total) by mouth at bedtime.    DULoxetine (CYMBALTA) 30 MG capsule Take 30-60 mg by mouth daily as needed.  DULoxetine (CYMBALTA) 60 MG capsule Take 1 capsule (60 mg total) by mouth at bedtime.    EPINEPHrine 0.3 mg/0.3 mL IJ SOAJ injection Inject 0.3 mg into the muscle as needed for anaphylaxis.    ezetimibe (ZETIA) 10 MG tablet Take 1 tablet (10 mg total) by mouth daily.    famotidine (PEPCID) 20 MG tablet TAKE 1 TABLET BY MOUTH TWICE DAILY    Ferrous Gluconate-C-Folic Acid (IRON-C PO) Take 30 mg by mouth daily.    Ginger, Zingiber officinalis, (GINGER PO) Take 1 tablet by mouth daily.    Ginseng 100 MG CAPS Take 100 mg by mouth daily.     Insulin Glargine (BASAGLAR KWIKPEN) 100  UNIT/ML Inject 32 Units into the skin daily.    Insulin Lispro-aabc (LYUMJEV KWIKPEN) 100 UNIT/ML KwikPen 22 units with Breakfast and continue 16 units with Lunch and 16 units with supper    isosorbide mononitrate (IMDUR) 60 MG 24 hr tablet Take 1 tablet by mouth once daily    lidocaine (XYLOCAINE) 2 % solution Use as directed 15 mLs in the mouth or throat as needed for mouth pain.    mometasone (ELOCON) 0.1 % ointment Apply topically 2 (two) times daily as needed. 03/05/2022: Applies to "ant bites"   Multiple Vitamins-Minerals (CENTRUM SILVER 50+MEN) TABS Take 1 tablet by mouth daily.    mupirocin ointment (BACTROBAN) 2 % Apply 1 Application topically 2 (two) times daily.    naproxen sodium (ALEVE) 220 MG tablet Take 440 mg by mouth daily as needed (body aches).    NARCAN 4 MG/0.1ML LIQD nasal spray kit Place 0.4 mg into the nose as needed (opioid overdose).     nitroGLYCERIN (NITROSTAT) 0.4 MG SL tablet Place 0.4 mg under the tongue every 5 (five) minutes as needed for chest pain.     Omega-3 1000 MG CAPS Take 1,000 mg by mouth daily.     ondansetron (ZOFRAN) 4 MG tablet Take 1 tablet (4 mg total) by mouth every 8 (eight) hours as needed for nausea or vomiting.    oxyCODONE (ROXICODONE) 15 MG immediate release tablet Take 1 tablet (15 mg total) by mouth every 4 (four) hours as needed for pain.    pantoprazole (PROTONIX) 40 MG tablet TAKE 1 TABLET(40 MG) BY MOUTH TWICE DAILY    Plecanatide (TRULANCE) 3 MG TABS Take 1 tablet by mouth daily.    polyethylene glycol (MIRALAX / GLYCOLAX) 17 g packet Take 17 g by mouth 2 (two) times daily.    pregabalin (LYRICA) 100 MG capsule TAKE 1 CAPSULE(100 MG) BY MOUTH TWICE DAILY    senna-docusate (SENOKOT-S) 8.6-50 MG tablet Take 1 tablet by mouth 2 (two) times daily.    sucralfate (CARAFATE) 1 g tablet TAKE 1 TABLET BY MOUTH 4 TIMES DAILY WITH MEALS AND AT BEDTIME    sulfamethoxazole-trimethoprim (BACTRIM DS) 800-160 MG tablet Take 1 tablet by mouth 2 (two)  times daily.    traZODone (DESYREL) 100 MG tablet Take 1 tablet (100 mg total) by mouth at bedtime as needed. for sleep    vitamin A 10000 UNIT capsule Take 10,000 Units by mouth daily.    vitamin B-12 (CYANOCOBALAMIN) 1000 MCG tablet Take 1,000 mcg by mouth daily.    Vitamin E 400 units TABS Take 400 Units by mouth daily.     No facility-administered encounter medications on file as of 07/30/2022.   Unsuccessful outbound call made today to assist with:  DM Assessment  Outreach Attempt:  3rd Attempt, to reach patient.  A HIPAA  compliant voice message was left requesting a return call.  Instructed patient to call back at  earliest convenience.   Current antihyperglycemic regimen:  Farxiga 10 mg 1 tab daily Basaglar 100 unit inject 32 units daily Lyumjev 100 units 22 units with Breakfast and continue 16 units with Lunch and 16 units with supper   Adherence Review: Is the patient currently on a STATIN medication? Yes, Atorvastatin 80 mg Is the patient currently on ACE/ARB medication? Yes, Benazepril 10 mg Does the patient have >5 day gap between last estimated fill dates? No    Care Gaps: Colonoscopy-06/18/22 Diabetic Foot Exam-04/17/22-last ordered not done Ophthalmology-03/23/22 Dexa Scan - NA Annual Well Visit - 04/17/22 (Jolene Cannady) Micro albumin-07/08/22 Hemoglobin A1c- 07/08/22 (10.4) 03/05/22 (9.8)   Star Rating Drugs: Atorvastatin 80 mg-last fill 05/26/22 90 ds, 02/25/22 90 ds Farxiga 10 mg-last fill 04/17/22 90 ds    [place] [phone]

## 2022-07-31 ENCOUNTER — Other Ambulatory Visit: Payer: Self-pay | Admitting: Nurse Practitioner

## 2022-07-31 ENCOUNTER — Ambulatory Visit: Payer: Medicare Other | Admitting: Nurse Practitioner

## 2022-08-03 DIAGNOSIS — Z96641 Presence of right artificial hip joint: Secondary | ICD-10-CM | POA: Diagnosis not present

## 2022-08-03 DIAGNOSIS — M160 Bilateral primary osteoarthritis of hip: Secondary | ICD-10-CM | POA: Diagnosis not present

## 2022-08-03 DIAGNOSIS — M1711 Unilateral primary osteoarthritis, right knee: Secondary | ICD-10-CM | POA: Diagnosis not present

## 2022-08-03 DIAGNOSIS — M25361 Other instability, right knee: Secondary | ICD-10-CM | POA: Diagnosis not present

## 2022-08-03 DIAGNOSIS — Z96651 Presence of right artificial knee joint: Secondary | ICD-10-CM | POA: Diagnosis not present

## 2022-08-03 DIAGNOSIS — M7061 Trochanteric bursitis, right hip: Secondary | ICD-10-CM | POA: Diagnosis not present

## 2022-08-03 NOTE — Telephone Encounter (Signed)
Requested medication (s) are due for refill today:  yes  Requested medication (s) are on the active medication list: yes    Last refill: 03/11/22  #60  3 refills  Future visit scheduled no  Notes to clinic:Not delegated, please review. Thank you.  Requested Prescriptions  Pending Prescriptions Disp Refills   ondansetron (ZOFRAN) 4 MG tablet [Pharmacy Med Name: ONDANSETRON '4MG'$  TABLETS] 60 tablet 3    Sig: TAKE 1 TABLET(4 MG) BY MOUTH EVERY 8 HOURS AS NEEDED FOR NAUSEA OR VOMITING     Not Delegated - Gastroenterology: Antiemetics - ondansetron Failed - 07/31/2022  3:48 PM      Failed - This refill cannot be delegated      Passed - AST in normal range and within 360 days    AST  Date Value Ref Range Status  07/08/2022 24 0 - 40 IU/L Final   SGOT(AST)  Date Value Ref Range Status  02/28/2014 20 15 - 37 Unit/L Final   AST (SGOT) Piccolo, Waived  Date Value Ref Range Status  11/03/2017 51 (H) 11 - 38 U/L Final         Passed - ALT in normal range and within 360 days    ALT  Date Value Ref Range Status  07/08/2022 23 0 - 44 IU/L Final   SGPT (ALT)  Date Value Ref Range Status  02/28/2014 25 12 - 78 U/L Final   ALT (SGPT) Piccolo, Waived  Date Value Ref Range Status  11/03/2017 58 (H) 10 - 47 U/L Final         Passed - Valid encounter within last 6 months    Recent Outpatient Visits           6 days ago Olecranon bursitis of left elbow   [facility] Mecum, Erin E, PA-C   3 weeks ago Uncontrolled type 2 diabetes mellitus with hyperglycemia ([place])   [place], [place] T, NP   1 month ago Senile purpura ([place])   [place], [place] T, NP   1 month ago Cellulitis of left upper extremity   [facility] Jon Billings, NP   3 months ago Olecranon bursitis of left elbow   [facility] Kathrine Haddock, NP       Future Appointments             In 7 months Nehemiah Massed Monia Sabal, MD [place]

## 2022-08-04 ENCOUNTER — Encounter: Payer: Self-pay | Admitting: Internal Medicine

## 2022-08-04 ENCOUNTER — Ambulatory Visit: Payer: Medicare Other | Admitting: Internal Medicine

## 2022-08-04 VITALS — BP 104/70 | HR 69 | Ht 67.0 in | Wt 247.0 lb

## 2022-08-04 DIAGNOSIS — E1142 Type 2 diabetes mellitus with diabetic polyneuropathy: Secondary | ICD-10-CM | POA: Diagnosis not present

## 2022-08-04 DIAGNOSIS — E1165 Type 2 diabetes mellitus with hyperglycemia: Secondary | ICD-10-CM | POA: Diagnosis not present

## 2022-08-04 DIAGNOSIS — E1159 Type 2 diabetes mellitus with other circulatory complications: Secondary | ICD-10-CM | POA: Diagnosis not present

## 2022-08-04 NOTE — Progress Notes (Unsigned)
Name: Edward Schneider.  Age/ Sex: 66 y.o., male   MRN/ DOB: [id number], [birth year]     PCP: Marjie Skiff, NP   Reason for Endocrinology Evaluation: Type 2 Diabetes Mellitus  Initial Endocrine Consultative Visit: 01/24/2019    PATIENT IDENTIFIER: Edward Schneider. is a 66 y.o. male with a past medical history of T2DM. The patient has followed with Endocrinology clinic since 01/24/2019 for consultative assistance with management of his diabetes.  DIABETIC HISTORY:  Mr. Gaede was diagnosed with T2DM in 2017. He has been on Jardiance in 2017 but due to cost was discontinued, as well as [place]. His hemoglobin A1c has ranged from 6.2% in 2018, peaking at 10.0 % in 2019.  On his initial visit to our clinic his A1c 10.9% , he was on metformin which we stopped in 04/2019 due to diarrhea.    Trulicity was discontinued in May 2023 due to acute pancreatitis  S/P PCI with DES 08/2019 SUBJECTIVE:   During the last visit (09/04/2021): A1c 8.3%.    Today (08/04/2022): Mr. Geske is here for a follow up on his diabetes management. He checks his blood sugars 2-3 times daily through freestyle libre. The patient have has not had a hypoglycemic episodes since the last clinic visit .  Since his last visit here he had a bout of acute pancreatitis in May 2023 requiring discontinuation of Trulicity  He was seen by podiatry in July 2023 He continues to follow-up with orthopedics for right hip arthritis   Has gained 10 lbs over the past year   He continues with left sided abdominal pain, worse with pain , follows with GI  No recent nausea or vomiting  Denies diarrhea but has constipation    HOME DIABETES REGIMEN:  Basaglar 32 units QHS  Humalog 22 units with Breakfast and continue 16 units with Lunch and 16 units with supper Farxiga 10 mg daily     CONTINUOUS GLUCOSE MONITORING RECORD INTERPRETATION    Dates of Recording:10/4-10/17/2023  Sensor description:freestyle libre  Results  statistics:   CGM use % of time 40  Average and SD 234/17  Time in range  7%  % Time Above 180 66  % Time above 250 27  % Time Below target 0    Glycemic patterns summary: Hyperglycemia noted during the day and night   Hyperglycemic episodes  postprandial   Hypoglycemic episodes occurred N/A  Overnight periods: High but trends down      DIABETIC COMPLICATIONS: Microvascular complications:  Neuropathy  Denies: CKD, retinopathy  Last eye exam: Completed 03/2022  Macrovascular complications:  CAD and CVA Denies: PVD    HISTORY:  Past Medical History:  Past Medical History:  Diagnosis Date   Allergy    Aortic atherosclerosis (HCC)    Asthma    C. difficile diarrhea    Chronic pain    Collagenous colitis    Coronary artery disease    a.) PCI with 2.75 x 18 mm Resolute Onyx DES x 1 to prox/mid LAD on 09/05/2019   DDD (degenerative disc disease), cervical    DDD (degenerative disc disease), lumbar    GERD (gastroesophageal reflux disease)    Grade I diastolic dysfunction    Hepatic steatosis    Hyperlipidemia    Hypertension    Liver cancer (HCC) 03/2015   Migraines    Myocardial infarction (HCC)     OSA on CPAP    Seizures (HCC)    several as child when  sick.  None since age 36   Stroke [facility])    'mini-stroke" 30 yrs ago. no deficits.   T2DM (type 2 diabetes mellitus) (HCC)    Wears dentures    full upper and lower   Past Surgical History:  Past Surgical History:  Procedure Laterality Date   APPENDECTOMY     BACK SURGERY     CARDIAC CATHETERIZATION     No stent placed in his "23's"   CERVICAL FUSION     COLONOSCOPY WITH PROPOFOL N/A 03/06/2016   Procedure: COLONOSCOPY WITH PROPOFOL;  Surgeon: Midge Minium, MD;  Location: [facility];  Service: Endoscopy;  Laterality: N/A;  requests early   COLONOSCOPY WITH PROPOFOL N/A 06/18/2020   Procedure: COLONOSCOPY WITH PROPOFOL;  Surgeon: Midge Minium, MD;  Location: [facility] END[facility]COPY;  Service: Endoscopy;   Laterality: N/A;   ESOPHAGOGASTRODUODENOSCOPY (EGD) WITH PROPOFOL N/A 09/20/2017   Procedure: ESOPHAGOGASTRODUODENOSCOPY (EGD) WITH PROPOFOL;  Surgeon: Midge Minium, MD;  Location: [facility];  Service: Endoscopy;  Laterality: N/A;  Diabetic - oral meds   FINGER SURGERY Left    INTRAVASCULAR PRESSURE WIRE/FFR STUDY N/A 09/05/2019   Procedure: INTRAVASCULAR PRESSURE WIRE/FFR STUDY;  Surgeon: Yvonne Kendall, MD;  Location: [facility] INVASIVE CV LAB;  Service: Cardiovascular;  Laterality: N/A;   KNEE SURGERY Right    LEFT HEART CATH AND CORONARY ANGIOGRAPHY Left 09/05/2019   Procedure: LEFT HEART CATH AND CORONARY ANGIOGRAPHY (2.75 x 18 mm Resolute Onyx DES x 1 to prox/mid LAD);  Surgeon: Yvonne Kendall, MD;  Location: [facility] INVASIVE CV LAB;  Service: Cardiovascular;  Laterality: Left;   NECK SURGERY     spleen surgery     TOE SURGERY Right    TOTAL HIP ARTHROPLASTY Right 06/03/2021   Procedure: TOTAL HIP ARTHROPLASTY ANTERIOR APPROACH;  Surgeon: Kennedy Bucker, MD;  Location: [facility] ORS;  Service: Orthopedics;  Laterality: Right;   TOTAL KNEE ARTHROPLASTY Right 08/22/2021   Procedure: TOTAL KNEE ARTHROPLASTY;  Surgeon: Kennedy Bucker, MD;  Location: [facility] ORS;  Service: Orthopedics;  Laterality: Right;   Social History:  reports that he quit smoking about 12 years ago. His smoking use included cigarettes. He has a 100.00 pack-year smoking history. He has never used smokeless tobacco. He reports that he does not drink alcohol and does not use drugs. Family History:  Family History  Problem Relation Age of Onset   Arthritis Mother    Diabetes Mother    Kidney disease Mother    Heart disease Mother    Hypertension Mother    Arthritis Father    Hearing loss Father    Hypertension Father    Heart disease Father    Diabetes Sister    Heart disease Sister    Diabetes Daughter    Diabetes Maternal Aunt    Diabetes Maternal Grandmother    Heart Problems Brother    Heart Problems Brother     Heart Problems Brother    Heart attack Maternal Grandfather    Colon cancer Paternal Grandfather      HOME MEDICATIONS: Allergies as of 08/04/2022       Reactions   Bee Venom Anaphylaxis   Crestor [rosuvastatin Calcium] Shortness Of Breath, Swelling   Fentanyl Itching, Hives   blisters Patch   Gabapentin Diarrhea   Severe diarrhea which caused incontinence, loss of appetite and weight loss.   Shellfish Allergy Anaphylaxis, Swelling   Shrimp causes throat to swell and tingling in tongue.    Fire Ant    Furosemide Nausea And Vomiting  Buprenorphine Hcl Itching   Chlorhexidine Gluconate Itching, Rash   Simvastatin Diarrhea        Medication List        Accurate as of August 04, 2022  3:12 PM. If you have any questions, ask your nurse or doctor.          acetaminophen 650 MG CR tablet Commonly known as: TYLENOL Take 1,300 mg by mouth every 8 (eight) hours.   albuterol 108 (90 Base) MCG/ACT inhaler Commonly known as: Ventolin HFA Inhale 2 puffs into the lungs every 6 (six) hours as needed for wheezing or shortness of breath.   aspirin EC 81 MG tablet Take 81 mg by mouth daily. Swallow whole.   atorvastatin 80 MG tablet Commonly known as: LIPITOR Take 1 tablet (80 mg total) by mouth at bedtime.   Basaglar KwikPen 100 UNIT/ML Inject 32 Units into the skin daily.   benazepril 10 MG tablet Commonly known as: LOTENSIN Take 1 tablet (10 mg total) by mouth daily.   bisacodyl 5 MG EC tablet Commonly known as: Dulcolax Take 2 tablets (10 mg total) by mouth daily in the afternoon.   Breztri Aerosphere 160-9-4.8 MCG/ACT Aero Generic drug: Budeson-Glycopyrrol-Formoterol Inhale 2 puffs into the lungs in the morning and at bedtime.   calcium carbonate 600 MG Tabs tablet Commonly known as: OS-CAL Take 600 mg by mouth daily.   Centrum Silver 50+Men Tabs Take 1 tablet by mouth daily.   cetirizine 10 MG tablet Commonly known as: ZYRTEC Take 1 tablet (10 mg  total) by mouth daily.   CINNAMON PO Take 1,000 mg by mouth 2 (two) times daily.   clobetasol cream 0.05 % Commonly known as: TEMOVATE Apply 1 application topically 2 (two) times daily.   Comfort EZ Pen Needles 32G X 4 MM Misc Generic drug: Insulin Pen Needle   cyanocobalamin 1000 MCG tablet Commonly known as: VITAMIN B12 Take 1,000 mcg by mouth daily.   cyclobenzaprine 10 MG tablet Commonly known as: FLEXERIL Take 1 tablet (10 mg total) by mouth 3 (three) times daily as needed for muscle spasms.   dapagliflozin propanediol 10 MG Tabs tablet Commonly known as: Farxiga Take 1 tablet (10 mg total) by mouth daily before breakfast.   diclofenac Sodium 1 % Gel Commonly known as: VOLTAREN Apply 2 g topically 4 (four) times daily as needed (pain).   diphenhydrAMINE 25 mg capsule Commonly known as: BENADRYL Take 50 mg by mouth in the morning, at noon, and at bedtime.   doxepin 25 MG capsule Commonly known as: SINEQUAN Take 2 capsules (50 mg total) by mouth at bedtime.   DULoxetine 60 MG capsule Commonly known as: CYMBALTA Take 1 capsule (60 mg total) by mouth at bedtime.   DULoxetine 30 MG capsule Commonly known as: CYMBALTA Take 30-60 mg by mouth daily as needed.   EPINEPHrine 0.3 mg/0.3 mL Soaj injection Commonly known as: EPI-PEN Inject 0.3 mg into the muscle as needed for anaphylaxis.   ezetimibe 10 MG tablet Commonly known as: ZETIA Take 1 tablet (10 mg total) by mouth daily.   famotidine 20 MG tablet Commonly known as: PEPCID TAKE 1 TABLET BY MOUTH TWICE DAILY   FreeStyle Libre 2 Sensor Misc Use as instructed to check blood sugar daily   GINGER PO Take 1 tablet by mouth daily.   Ginseng 100 MG Caps Take 100 mg by mouth daily.   IRON-C PO Take 30 mg by mouth daily.   isosorbide mononitrate 60 MG 24 hr tablet Commonly  known as: IMDUR Take 1 tablet by mouth once daily   lidocaine 2 % solution Commonly known as: XYLOCAINE Use as directed 15 mLs in  the mouth or throat as needed for mouth pain.   Lyumjev KwikPen 100 UNIT/ML KwikPen Generic drug: Insulin Lispro-aabc 22 units with Breakfast and continue 16 units with Lunch and 16 units with supper   mometasone 0.1 % ointment Commonly known as: ELOCON Apply topically 2 (two) times daily as needed.   mupirocin ointment 2 % Commonly known as: BACTROBAN Apply 1 Application topically 2 (two) times daily.   naproxen sodium 220 MG tablet Commonly known as: ALEVE Take 440 mg by mouth daily as needed (body aches).   Narcan 4 MG/0.1ML Liqd nasal spray kit Generic drug: naloxone Place 0.4 mg into the nose as needed (opioid overdose).   nitroGLYCERIN 0.4 MG SL tablet Commonly known as: NITROSTAT Place 0.4 mg under the tongue every 5 (five) minutes as needed for chest pain.   Omega-3 1000 MG Caps Take 1,000 mg by mouth daily.   ondansetron 4 MG tablet Commonly known as: ZOFRAN TAKE 1 TABLET(4 MG) BY MOUTH EVERY 8 HOURS AS NEEDED FOR NAUSEA OR VOMITING   pantoprazole 40 MG tablet Commonly known as: PROTONIX TAKE 1 TABLET(40 MG) BY MOUTH TWICE DAILY   Pharmacist Choice Alcohol Pads SMARTSIG:1 Each Topical 4 Times Daily   polyethylene glycol 17 g packet Commonly known as: MIRALAX / GLYCOLAX Take 17 g by mouth 2 (two) times daily.   pregabalin 100 MG capsule Commonly known as: LYRICA TAKE 1 CAPSULE(100 MG) BY MOUTH TWICE DAILY   senna-docusate 8.6-50 MG tablet Commonly known as: Senokot-S Take 1 tablet by mouth 2 (two) times daily.   sucralfate 1 g tablet Commonly known as: CARAFATE TAKE 1 TABLET BY MOUTH 4 TIMES DAILY WITH MEALS AND AT BEDTIME   sulfamethoxazole-trimethoprim 800-160 MG tablet Commonly known as: BACTRIM DS Take 1 tablet by mouth 2 (two) times daily.   traZODone 100 MG tablet Commonly known as: DESYREL Take 1 tablet (100 mg total) by mouth at bedtime as needed. for sleep   Trulance 3 MG Tabs Generic drug: Plecanatide Take 1 tablet by mouth daily.    vitamin A 16109 UNIT capsule Take 10,000 Units by mouth daily.   vitamin C 1000 MG tablet Take 1,000 mg by mouth daily.   Vitamin D3 25 MCG (1000 UT) Chew Chew 1,000 Units by mouth daily.   Vitamin E 400 units Tabs Take 400 Units by mouth daily.         OBJECTIVE:   Vital Signs: BP 104/70 (BP Location: Right Arm, Patient Position: Sitting, Cuff Size: Large)   Pulse 69   Ht 5\' 7"  (1.702 m)   Wt 247 lb (112 kg)   SpO2 99%   BMI 38.69 kg/m   Wt Readings from Last 3 Encounters:  08/04/22 247 lb (112 kg)  07/29/22 246 lb (111.6 kg)  07/28/22 248 lb (112.5 kg)     Exam: General: Pt appears well and is in NAD  Lungs: Clear with good BS bilat with no rales, rhonchi, or wheezes  Heart: RRR   Extremities: No pretibial edema.    Neuro: MS is good with appropriate affect, pt is alert and Ox3       DM foot exam: 08/04/2022 The skin of the feet is without sores or ulcerations The pedal pulses are 1+ B/L The sensation is absent to a screening 5.07, 10 gram monofilament on the right  DATA REVIEWED:  Lab Results  Component Value Date   HGBA1C 10.4 (H) 07/08/2022   HGBA1C 9.8 (H) 03/05/2022   HGBA1C 8.3 (H) 08/23/2021    Latest Reference Range & Units 07/08/22 13:55  Sodium 134 - 144 mmol/L 135  Potassium 3.5 - 5.2 mmol/L 4.9  Chloride 96 - 106 mmol/L 99  CO2 20 - 29 mmol/L 19 (L)  Glucose 70 - 99 mg/dL 034 (H)  BUN 8 - 27 mg/dL 15  Creatinine 7.42 - 5.95 mg/dL 6.38  Calcium 8.6 - 75.6 mg/dL 9.7  BUN/Creatinine Ratio 10 - 24  14  eGFR >59 mL/min/1.73 79  Magnesium 1.6 - 2.3 mg/dL 1.8  Alkaline Phosphatase 44 - 121 IU/L 70  Albumin 3.9 - 4.9 g/dL 4.3  Albumin/Globulin Ratio 1.2 - 2.2  1.4  Amylase, Serum 31 - 110 U/L 79  Lipase 13 - 78 U/L 86 (H)  AST 0 - 40 IU/L 24  ALT 0 - 44 IU/L 23  Total Protein 6.0 - 8.5 g/dL 7.3  Total Bilirubin 0.0 - 1.2 mg/dL 0.4     Latest Reference Range & Units 07/08/22 13:55  Cholesterol, Total 100 - 199 mg/dL  433  HDL Cholesterol >29 mg/dL 47  Triglycerides 0 - 518 mg/dL 841  VLDL Cholesterol Cal 5 - 40 mg/dL 25  LDL Chol Calc (NIH) 0 - 99 mg/dL 47     ASSESSMENT / PLAN / RECOMMENDATIONS:   1) 1) Type 2 Diabetes Mellitus, Poorly  control, With neuropathic and macrovascular complications - Most recent A1c of  10.4 %. Goal A1c < 7.0 %.      -His A1c has increased from 8.3% to 10.4 %  -Trulicity had to be discontinued due to pancreatitis in May 2023, hence all GLP-1 agonist including Mounjaro are contraindicated -He is on patient assistance program for his basal, prandial and Farxiga -I will increase his insulin as below -He is interested in insulin pump technology, he was given the contact information for tandem pump to start the paperwork, a referral has been placed to our CDE for training -He will also be given a Humalog dose to take with a bedtime snack  MEDICATIONS:  Increase Basaglar  40 units daily  Continue Humalog 22 units with Breakfast , 16 units with Lunch and 16 units with supper Take Humalog 6 units with a bedtime snack Continue Farxiga 10 mg, 1 tablet with Breakfast  Continue Correction factor: Humalog (BG -130/25)     EDUCATION / INSTRUCTIONS: BG monitoring instructions: Patient is instructed to check his blood sugars 4 times a day, before meals . Call [place] [facility] if: BG persistently < 70 I reviewed the Rule of 15 for the treatment of hypoglycemia in detail with the patient. Literature supplied.    F/U in  6 months    Signed electronically by: Lyndle Herrlich, MD  [facility]  [facility] [address] [place], [place] [address] Phone: [phone] FAX: [phone]   CC: Marjie Skiff, NP [address] [place] [address] Phone: [phone]  Fax: [phone]  Return to Endocrinology clinic as below: Future Appointments  Date Time Provider Department Center  11/09/2022  3:00 PM [facility]  PHARMACY [facility]  12/11/2022 11:50 AM Yarithza Mink, Konrad Dolores, MD [facility] None  03/10/2023  9:45 AM Deirdre Evener, MD [facility] None

## 2022-08-04 NOTE — Patient Instructions (Signed)
-   Increase  Basaglar 40 units daily  - Continue Humalog 22 units with Breskfast and continue 16 units with Lunch and Supper - Take Humalog 6 units with a bedtime snack  - Continue  Farxiga 10 mg , 1 tablet with Breakfast    -Humalog correctional insulin: ADD extra units on insulin to your meal-time Humalog dose if your blood sugars are higher than 155. Use the scale below to help guide you:   Blood sugar before meal Number of units to inject  Less than 155 0 unit  156 -  180 1 units  181 -  205 2 units  206 -  230 3 units  231 -  255 4 units  256 -  280 5 units  281 -  305 6 units  306 -  330 7 units  331 -  355 8 units    Check out the Tandem/ T: Slim pump : Call Help Line to start the Process [phone]   HOW TO TREAT LOW BLOOD SUGARS (Blood sugar LESS THAN 70 MG/DL) Please follow the RULE OF 15 for the treatment of hypoglycemia treatment (when your (blood sugars are less than 70 mg/dL)   STEP 1: Take 15 grams of carbohydrates when your blood sugar is low, which includes:  3-4 GLUCOSE TABS  OR 3-4 OZ OF JUICE OR REGULAR SODA OR ONE TUBE OF GLUCOSE GEL    STEP 2: RECHECK blood sugar in 15 MINUTES STEP 3: If your blood sugar is still low at the 15 minute recheck --> then, go back to STEP 1 and treat AGAIN with another 15 grams of carbohydrates.

## 2022-08-05 ENCOUNTER — Encounter: Payer: Self-pay | Admitting: Internal Medicine

## 2022-08-05 DIAGNOSIS — M71122 Other infective bursitis, left elbow: Secondary | ICD-10-CM | POA: Diagnosis not present

## 2022-08-10 DIAGNOSIS — B351 Tinea unguium: Secondary | ICD-10-CM | POA: Diagnosis not present

## 2022-08-10 DIAGNOSIS — Z794 Long term (current) use of insulin: Secondary | ICD-10-CM | POA: Diagnosis not present

## 2022-08-10 DIAGNOSIS — E1140 Type 2 diabetes mellitus with diabetic neuropathy, unspecified: Secondary | ICD-10-CM | POA: Diagnosis not present

## 2022-08-11 ENCOUNTER — Telehealth: Payer: Self-pay

## 2022-08-12 DIAGNOSIS — M71122 Other infective bursitis, left elbow: Secondary | ICD-10-CM | POA: Diagnosis not present

## 2022-08-14 DIAGNOSIS — E1165 Type 2 diabetes mellitus with hyperglycemia: Secondary | ICD-10-CM | POA: Diagnosis not present

## 2022-08-18 ENCOUNTER — Telehealth: Payer: Self-pay

## 2022-08-18 DIAGNOSIS — M19012 Primary osteoarthritis, left shoulder: Secondary | ICD-10-CM | POA: Diagnosis not present

## 2022-08-18 DIAGNOSIS — M5136 Other intervertebral disc degeneration, lumbar region: Secondary | ICD-10-CM | POA: Diagnosis not present

## 2022-08-18 DIAGNOSIS — G894 Chronic pain syndrome: Secondary | ICD-10-CM | POA: Diagnosis not present

## 2022-08-18 DIAGNOSIS — Z79891 Long term (current) use of opiate analgesic: Secondary | ICD-10-CM | POA: Diagnosis not present

## 2022-08-18 DIAGNOSIS — M179 Osteoarthritis of knee, unspecified: Secondary | ICD-10-CM | POA: Diagnosis not present

## 2022-08-18 DIAGNOSIS — M545 Low back pain, unspecified: Secondary | ICD-10-CM | POA: Diagnosis not present

## 2022-08-18 DIAGNOSIS — M169 Osteoarthritis of hip, unspecified: Secondary | ICD-10-CM | POA: Diagnosis not present

## 2022-08-18 NOTE — Telephone Encounter (Signed)
Patient advised that he needs to fill out a new application for [facility] patient assistance. I will mail a application to patient.

## 2022-08-20 DIAGNOSIS — G4733 Obstructive sleep apnea (adult) (pediatric): Secondary | ICD-10-CM | POA: Diagnosis not present

## 2022-08-21 ENCOUNTER — Other Ambulatory Visit: Payer: Self-pay | Admitting: Nurse Practitioner

## 2022-08-21 NOTE — Telephone Encounter (Signed)
Last RF 10/27/21 #360 4 RF  Requested Prescriptions  Refused Prescriptions Disp Refills   sucralfate (CARAFATE) 1 g tablet [Pharmacy Med Name: SUCRALFATE 1GM TABLETS] 360 tablet 4    Sig: TAKE 1 TABLET BY MOUTH FOUR TIMES DAILY AT BEDTIME WITH MEALS     Gastroenterology: Antiacids Passed - 08/21/2022  4:19 PM      Passed - Valid encounter within last 12 months    Recent Outpatient Visits           3 weeks ago Olecranon bursitis of left elbow   [facility] Mecum, Erin E, PA-C   1 month ago Uncontrolled type 2 diabetes mellitus with hyperglycemia ([place])   [place], [place] T, NP   1 month ago Senile purpura ([place])   [place], [place] T, NP   1 month ago Cellulitis of left upper extremity   [place], NP   4 months ago Olecranon bursitis of left elbow   [facility] Kathrine Haddock, NP       Future Appointments             In 6 months Nehemiah Massed Monia Sabal, MD [place]

## 2022-08-24 DIAGNOSIS — M71122 Other infective bursitis, left elbow: Secondary | ICD-10-CM | POA: Diagnosis not present

## 2022-08-24 NOTE — Telephone Encounter (Signed)
duplicate

## 2022-08-26 DIAGNOSIS — L2089 Other atopic dermatitis: Secondary | ICD-10-CM | POA: Diagnosis not present

## 2022-08-26 DIAGNOSIS — L281 Prurigo nodularis: Secondary | ICD-10-CM | POA: Diagnosis not present

## 2022-08-26 DIAGNOSIS — Z79899 Other long term (current) drug therapy: Secondary | ICD-10-CM | POA: Diagnosis not present

## 2022-09-14 DIAGNOSIS — M71122 Other infective bursitis, left elbow: Secondary | ICD-10-CM | POA: Diagnosis not present

## 2022-09-15 DIAGNOSIS — R202 Paresthesia of skin: Secondary | ICD-10-CM | POA: Diagnosis not present

## 2022-09-15 DIAGNOSIS — M19012 Primary osteoarthritis, left shoulder: Secondary | ICD-10-CM | POA: Diagnosis not present

## 2022-09-15 DIAGNOSIS — M47897 Other spondylosis, lumbosacral region: Secondary | ICD-10-CM | POA: Diagnosis not present

## 2022-09-15 DIAGNOSIS — M5136 Other intervertebral disc degeneration, lumbar region: Secondary | ICD-10-CM | POA: Diagnosis not present

## 2022-09-15 DIAGNOSIS — G894 Chronic pain syndrome: Secondary | ICD-10-CM | POA: Diagnosis not present

## 2022-09-15 DIAGNOSIS — M169 Osteoarthritis of hip, unspecified: Secondary | ICD-10-CM | POA: Diagnosis not present

## 2022-09-15 NOTE — Telephone Encounter (Signed)
Patient brought by financial information for the [facility] Patient Assistance Application.  Information is in Dr. Quin Hoop bin in front office.

## 2022-09-16 NOTE — Telephone Encounter (Signed)
Patient informed that [place] has been approved until the end of the 2024 calendar year for [employer]. Incoming fax scanned into chart of the notification.

## 2022-09-23 ENCOUNTER — Encounter: Payer: Self-pay | Admitting: *Deleted

## 2022-09-29 ENCOUNTER — Other Ambulatory Visit: Payer: Self-pay

## 2022-09-29 DIAGNOSIS — Z122 Encounter for screening for malignant neoplasm of respiratory organs: Secondary | ICD-10-CM

## 2022-09-29 DIAGNOSIS — Z87891 Personal history of nicotine dependence: Secondary | ICD-10-CM

## 2022-09-30 ENCOUNTER — Other Ambulatory Visit: Payer: Self-pay | Admitting: Internal Medicine

## 2022-09-30 ENCOUNTER — Encounter: Payer: Medicare Other | Attending: Nurse Practitioner | Admitting: Nutrition

## 2022-09-30 ENCOUNTER — Other Ambulatory Visit: Payer: Medicare Other

## 2022-09-30 DIAGNOSIS — E1165 Type 2 diabetes mellitus with hyperglycemia: Secondary | ICD-10-CM

## 2022-09-30 DIAGNOSIS — E1142 Type 2 diabetes mellitus with diabetic polyneuropathy: Secondary | ICD-10-CM | POA: Insufficient documentation

## 2022-09-30 NOTE — Patient Instructions (Signed)
Call me when you receive your [phone]

## 2022-09-30 NOTE — Progress Notes (Signed)
We discussed how insulin pumps work and what is needed to be on an insulin pump.  He is wanting a Tandem pump, and thought he filled out paperwork for this last week.  It turns out that he filled out paperwork for [employer] cares program.  Shari Heritage was filled out and faxed to Tandem with labs drawn today for this.  He was given a brochure with Chris's number to call in one week to check on status of this order.  He will call me when the pump comes in to schedule a training date.  He had no final questions.

## 2022-10-01 LAB — GLUCOSE, FASTING: Glucose, Bld: 187 mg/dL — ABNORMAL HIGH (ref 65–99)

## 2022-10-02 ENCOUNTER — Other Ambulatory Visit: Payer: Self-pay | Admitting: Nurse Practitioner

## 2022-10-02 NOTE — Telephone Encounter (Signed)
Refilled 10/27/2021 #360 4 rf - same pharmacy - confirmed. Requested Prescriptions  Pending Prescriptions Disp Refills   sucralfate (CARAFATE) 1 g tablet [Pharmacy Med Name: SUCRALFATE 1GM TABLETS] 360 tablet 4    Sig: TAKE 1 TABLET BY MOUTH FOUR TIMES DAILY AT BEDTIME WITH MEALS     Gastroenterology: Antiacids Passed - 10/02/2022  4:08 PM      Passed - Valid encounter within last 12 months    Recent Outpatient Visits           2 months ago Olecranon bursitis of left elbow   [place], PA-C   2 months ago Uncontrolled type 2 diabetes mellitus with hyperglycemia ([place])   [place], [place] T, NP   3 months ago Senile purpura ([place])   [place], [place] T, NP   3 months ago Cellulitis of left upper extremity   [place], NP   5 months ago Olecranon bursitis of left elbow   [facility] Kathrine Haddock, NP       Future Appointments             In 5 months Nehemiah Massed Monia Sabal, MD [place]

## 2022-10-09 ENCOUNTER — Other Ambulatory Visit: Payer: Self-pay | Admitting: Nurse Practitioner

## 2022-10-09 DIAGNOSIS — E1142 Type 2 diabetes mellitus with diabetic polyneuropathy: Secondary | ICD-10-CM

## 2022-10-09 NOTE — Telephone Encounter (Signed)
Requested medication (s) are due for refill today: routing for approval  Requested medication (s) are on the active medication list:yes  Last refill:  06/24/22  Future visit scheduled: yes  Notes to clinic:  Unable to refill per protocol, last refill by another provider. Historical medication, routing for approval.     Requested Prescriptions  Pending Prescriptions Disp Refills   DULoxetine (CYMBALTA) 60 MG capsule [Pharmacy Med Name: DULOXETINE DR 60MG CAPSULES] 90 capsule 4    Sig: TAKE 1 CAPSULE BY MOUTH AT BEDTIME     Psychiatry: Antidepressants - SNRI - duloxetine Passed - 10/09/2022  3:40 AM      Passed - Cr in normal range and within 360 days    Creat  Date Value Ref Range Status  12/19/2021 1.10 0.70 - 1.35 mg/dL Final   Creatinine, Ser  Date Value Ref Range Status  07/08/2022 1.05 0.76 - 1.27 mg/dL Final         Passed - eGFR is 30 or above and within 360 days    EGFR (African American)  Date Value Ref Range Status  02/28/2014 >60  Final   GFR calc Af Amer  Date Value Ref Range Status  12/06/2020 75 >59 mL/min/1.73 Final    Comment:    **In accordance with recommendations from the NKF-ASN Task force,**   [facility] is in the process of updating its eGFR calculation to the   2021 CKD-EPI creatinine equation that estimates kidney function   without a race variable.    EGFR (Non-African Amer.)  Date Value Ref Range Status  02/28/2014 >60  Final    Comment:    eGFR values <82m/min/1.73 m2 may be an indication of chronic kidney disease (CKD). Calculated eGFR is useful in patients with stable renal function. The eGFR calculation will not be reliable in acutely ill patients when serum creatinine is changing rapidly. It is not useful in  patients on dialysis. The eGFR calculation may not be applicable to patients at the low and high extremes of body sizes, pregnant women, and vegetarians.    GFR, Estimated  Date Value Ref Range Status  03/05/2022 >60 >60  mL/min Final    Comment:    (NOTE) Calculated using the CKD-EPI Creatinine Equation (2021)    GFR  Date Value Ref Range Status  01/24/2019 66.47 >60.00 mL/min Final   eGFR  Date Value Ref Range Status  07/08/2022 79 >59 mL/min/1.73 Final         Passed - Completed PHQ-2 or PHQ-9 in the last 360 days      Passed - Last BP in normal range    BP Readings from Last 1 Encounters:  08/04/22 104/70         Passed - Valid encounter within last 6 months    Recent Outpatient Visits           2 months ago Olecranon bursitis of left elbow   [facility] Mecum, Erin E, PA-C   3 months ago Uncontrolled type 2 diabetes mellitus with hyperglycemia ([place]   [place], NP   3 months ago Senile purpura ([place]   [place], NP   3 months ago Cellulitis of left upper extremity   [place] NP   5 months ago [facility]ranon [facility]ursitis of left elbow   [facility]       Future Appointments  In 5 months Ralene Bathe, MD [place]

## 2022-10-13 LAB — GLUCOSE 16585

## 2022-10-13 LAB — C-PEPTIDE: C-Peptide: 3.57 ng/mL (ref 0.80–3.85)

## 2022-10-14 ENCOUNTER — Ambulatory Visit
Admission: RE | Admit: 2022-10-14 | Discharge: 2022-10-14 | Disposition: A | Discharge status: Home / Self Care | Payer: Medicare Other | Source: Ambulatory Visit | Attending: Acute Care | Admitting: Acute Care

## 2022-10-14 ENCOUNTER — Other Ambulatory Visit: Payer: Self-pay

## 2022-10-14 DIAGNOSIS — Z87891 Personal history of nicotine dependence: Secondary | ICD-10-CM | POA: Insufficient documentation

## 2022-10-14 DIAGNOSIS — Z122 Encounter for screening for malignant neoplasm of respiratory organs: Secondary | ICD-10-CM | POA: Insufficient documentation

## 2022-10-20 ENCOUNTER — Encounter: Payer: Self-pay | Admitting: Nurse Practitioner

## 2022-10-20 ENCOUNTER — Telehealth: Payer: Self-pay | Admitting: Acute Care

## 2022-10-20 DIAGNOSIS — Z87891 Personal history of nicotine dependence: Secondary | ICD-10-CM

## 2022-10-20 DIAGNOSIS — R911 Solitary pulmonary nodule: Secondary | ICD-10-CM | POA: Insufficient documentation

## 2022-10-20 NOTE — Telephone Encounter (Signed)
Spoke with patient, using two patient identifiers, to review results of recent LDCT.  Emphysema and atherosclerosis as previous noted. Patient is prescribed statin medication.  New nodule in left lung with recommendation for repeat LDCT in 6 months.  Patient states he has had periods of increased congestion but no recent symptoms of illness.  Patient agrees to 6 month follow up, as a precaution, to look at nodule again sooner than 12 months.  Order placed for 6 month follow up LCS LDCT and results faxed to PCP with plan.  Patient had no questions.

## 2022-10-23 ENCOUNTER — Other Ambulatory Visit: Payer: Self-pay | Admitting: Nurse Practitioner

## 2022-10-23 NOTE — Telephone Encounter (Signed)
Requested medication (s) are due for refill today: routing for approval  Requested medication (s) are on the active medication list: yes  Last refill:  08/03/22  Future visit scheduled: yes  Notes to clinic:  Unable to refill per protocol, cannot delegate.      Requested Prescriptions  Pending Prescriptions Disp Refills   ondansetron (ZOFRAN) 4 MG tablet [Pharmacy Med Name: ONDANSETRON '4MG'$  TABLETS] 60 tablet 3    Sig: TAKE 1 TABLET(4 MG) BY MOUTH EVERY 8 HOURS AS NEEDED FOR NAUSEA OR VOMITING     Not Delegated - Gastroenterology: Antiemetics - ondansetron Failed - 10/23/2022  1:13 PM      Failed - This refill cannot be delegated      Passed - AST in normal range and within 360 days    AST  Date Value Ref Range Status  07/08/2022 24 0 - 40 IU/L Final   SGOT(AST)  Date Value Ref Range Status  02/28/2014 20 15 - 37 Unit/L Final   AST (SGOT) Piccolo, Waived  Date Value Ref Range Status  11/03/2017 51 (H) 11 - 38 U/L Final         Passed - ALT in normal range and within 360 days    ALT  Date Value Ref Range Status  07/08/2022 23 0 - 44 IU/L Final   SGPT (ALT)  Date Value Ref Range Status  02/28/2014 25 12 - 78 U/L Final   ALT (SGPT) Piccolo, Waived  Date Value Ref Range Status  11/03/2017 58 (H) 10 - 47 U/L Final         Passed - Valid encounter within last 6 months    Recent Outpatient Visits           2 months ago Olecranon bursitis of left elbow   [facility] Mecum, Erin E, PA-C   3 months ago Uncontrolled type 2 diabetes mellitus with hyperglycemia ([place])   [place], Ryan T, NP   3 months ago Senile purpura ([place])   [place], [place] T, NP   3 months ago Cellulitis of left upper extremity   [facility] Jon Billings, NP   6 months ago Olecranon bursitis of left elbow   [facility] Kathrine Haddock, NP       Future Appointments             In 4 months Nehemiah Massed  Monia Sabal, MD [place]

## 2022-10-23 NOTE — Telephone Encounter (Signed)
Requested medication (s) are due for refill today: yes duplicate request  Requested medication (s) are on the active medication list: yes   Last refill:  08/03/22 #60 3 refills  Future visit scheduled: no   Notes to clinic:  not delegated per protocol. Duplicate request. Do you want to refill Rx?     Requested Prescriptions  Pending Prescriptions Disp Refills   ondansetron (ZOFRAN) 4 MG tablet [Pharmacy Med Name: ONDANSETRON '4MG'$  TABLETS] 60 tablet 3    Sig: TAKE 1 TABLET(4 MG) BY MOUTH EVERY 8 HOURS AS NEEDED FOR NAUSEA OR VOMITING     Not Delegated - Gastroenterology: Antiemetics - ondansetron Failed - 10/23/2022  1:10 PM      Failed - This refill cannot be delegated      Passed - AST in normal range and within 360 days    AST  Date Value Ref Range Status  07/08/2022 24 0 - 40 IU/L Final   SGOT(AST)  Date Value Ref Range Status  02/28/2014 20 15 - 37 Unit/L Final   AST (SGOT) Piccolo, Waived  Date Value Ref Range Status  11/03/2017 51 (H) 11 - 38 U/L Final         Passed - ALT in normal range and within 360 days    ALT  Date Value Ref Range Status  07/08/2022 23 0 - 44 IU/L Final   SGPT (ALT)  Date Value Ref Range Status  02/28/2014 25 12 - 78 U/L Final   ALT (SGPT) Piccolo, Waived  Date Value Ref Range Status  11/03/2017 58 (H) 10 - 47 U/L Final         Passed - Valid encounter within last 6 months    Recent Outpatient Visits           2 months ago Olecranon bursitis of left elbow   [facility] Mecum, Erin E, PA-C   3 months ago Uncontrolled type 2 diabetes mellitus with hyperglycemia ([place])   [place], [place] T, NP   3 months ago Senile purpura ([place])   [place], [place] T, NP   3 months ago Cellulitis of left upper extremity   [facility] Jon Billings, NP   6 months ago Olecranon bursitis of left elbow   [facility] Kathrine Haddock, NP       Future Appointments              In 4 months Nehemiah Massed Monia Sabal, MD [place]

## 2022-11-02 ENCOUNTER — Other Ambulatory Visit: Payer: Self-pay | Admitting: Nurse Practitioner

## 2022-11-02 NOTE — Telephone Encounter (Signed)
Medication Refill - Medication: isosorbide mononitrate (IMDUR) 60 MG 24 hr tablet   Has the patient contacted their pharmacy? Yes.   (Agent: If no, request that the patient contact the pharmacy for the refill. If patient does not wish to contact the pharmacy document the reason why and proceed with request.) (Agent: If yes, when and what did the pharmacy advise?)  Preferred Pharmacy (with phone number or street name):  [id number] - [place], [place] [address] AT [place] Phone: [phone]  Fax: [phone]     Has the patient been seen for an appointment in the last year OR does the patient have an upcoming appointment? Yes.    Agent: Please be advised that RX refills may take up to 3 business days. We ask that you follow-up with your pharmacy.

## 2022-11-03 MED ORDER — ISOSORBIDE MONONITRATE ER 60 MG PO TB24: 60.0000 mg | ORAL_TABLET | Freq: Every day | ORAL | 2 refills | Status: AC

## 2022-11-03 NOTE — Telephone Encounter (Signed)
Requested Prescriptions  Pending Prescriptions Disp Refills   isosorbide mononitrate (IMDUR) 60 MG 24 hr tablet 90 tablet 2    Sig: Take 1 tablet (60 mg total) by mouth daily.     Cardiovascular:  Nitrates Passed - 11/02/2022  2:46 PM      Passed - Last BP in normal range    BP Readings from Last 1 Encounters:  08/04/22 104/70         Passed - Last Heart Rate in normal range    Pulse Readings from Last 1 Encounters:  08/04/22 69         Passed - Valid encounter within last 12 months    Recent Outpatient Visits           3 months ago Olecranon bursitis of left elbow   [facility] Mecum, Erin E, PA-C   3 months ago Uncontrolled type 2 diabetes mellitus with hyperglycemia ([place])   [place], [place] T, NP   4 months ago Senile purpura ([place])   [place], [place] T, NP   4 months ago Cellulitis of left upper extremity   [place], NP   6 months ago Olecranon bursitis of left elbow   [facility] Kathrine Haddock, NP       Future Appointments             In 4 months Nehemiah Massed Monia Sabal, MD [place]

## 2022-11-03 NOTE — Telephone Encounter (Signed)
Requested medication (s) are due for refill today - no  Requested medication (s) are on the active medication list -no  Future visit scheduled -no  Last refill: 12/26/21  Notes to clinic: medication not assigned to protocol- provider review   Requested Prescriptions  Pending Prescriptions Disp Refills   amoxicillin-clavulanate (AUGMENTIN) 875-125 MG tablet [Pharmacy Med Name: AMOX-CLAV 875-'125MG'$  TABLETS] 14 tablet 0    Sig: TAKE 1 TABLET BY MOUTH TWICE DAILY FOR 7 DAYS     Off-Protocol Failed - 11/02/2022  1:45 PM      Failed - Medication not assigned to a protocol, review manually.      Passed - Valid encounter within last 12 months    Recent Outpatient Visits           3 months ago Olecranon bursitis of left elbow   [facility] Mecum, Erin E, PA-C   3 months ago Uncontrolled type 2 diabetes mellitus with hyperglycemia ([place])   [place], [place] T, NP   4 months ago Senile purpura ([place])   [place], [place] T, NP   4 months ago Cellulitis of left upper extremity   [place], NP   6 months ago Olecranon bursitis of left elbow   [facility] Kathrine Haddock, NP       Future Appointments             In 4 months Nehemiah Massed Monia Sabal, MD [place]               Requested Prescriptions  Pending Prescriptions Disp Refills   amoxicillin-clavulanate (AUGMENTIN) 875-125 MG tablet [Pharmacy Med Name: AMOX-CLAV 875-'125MG'$  TABLETS] 14 tablet 0    Sig: TAKE 1 TABLET BY MOUTH TWICE DAILY FOR 7 DAYS     Off-Protocol Failed - 11/02/2022  1:45 PM      Failed - Medication not assigned to a protocol, review manually.      Passed - Valid encounter within last 12 months    Recent Outpatient Visits           3 months ago Olecranon bursitis of left elbow   [facility] Mecum, Erin E, PA-C   3 months ago Uncontrolled type 2 diabetes mellitus with hyperglycemia  ([place])   [place], [place] T, NP   4 months ago Senile purpura ([place])   [place], [place] T, NP   4 months ago Cellulitis of left upper extremity   [place], NP   6 months ago Olecranon bursitis of left elbow   [facility] Kathrine Haddock, NP       Future Appointments             In 4 months Nehemiah Massed Monia Sabal, MD [place]

## 2022-11-05 ENCOUNTER — Other Ambulatory Visit: Payer: Self-pay | Admitting: Pulmonary Disease

## 2022-11-09 ENCOUNTER — Ambulatory Visit: Payer: Medicare Other

## 2022-11-09 NOTE — Patient Outreach (Signed)
Remove from program. Patient changing PCP's to a different facility

## 2022-11-27 ENCOUNTER — Telehealth: Payer: Self-pay | Admitting: Dietician

## 2022-11-27 NOTE — Telephone Encounter (Signed)
Have been communicating with the t:slim insulin pump rep.  It currently appears that patient does not meet medicare guidelines for this pump as his c-peptide is high.    Called patient and discussed.  He is willing to proceed with another option to better control his blood glucose. Message sent to [facility].  Antonieta Iba, RD, LDN, CDCES

## 2022-12-02 ENCOUNTER — Other Ambulatory Visit: Payer: Self-pay | Admitting: Internal Medicine

## 2022-12-02 DIAGNOSIS — E1142 Type 2 diabetes mellitus with diabetic polyneuropathy: Secondary | ICD-10-CM

## 2022-12-02 MED ORDER — OMNIPOD 5 DEXG7G6 INTRO GEN 5 KIT: 1.0000 | PACK | 0 refills | Status: DC

## 2022-12-02 MED ORDER — OMNIPOD 5 DEXG7G6 PODS GEN 5 MISC: 1.0000 | 3 refills | Status: DC

## 2022-12-07 ENCOUNTER — Telehealth: Payer: Self-pay

## 2022-12-07 NOTE — Telephone Encounter (Signed)
Patient has receive his pump supplies and would like to get schedule for training.

## 2022-12-11 ENCOUNTER — Encounter: Payer: Self-pay | Admitting: Internal Medicine

## 2022-12-11 ENCOUNTER — Telehealth: Payer: Self-pay | Admitting: Internal Medicine

## 2022-12-11 ENCOUNTER — Ambulatory Visit: Payer: Medicare Other | Admitting: Internal Medicine

## 2022-12-11 VITALS — BP 110/70 | HR 100 | Ht 67.0 in | Wt 251.0 lb

## 2022-12-11 DIAGNOSIS — E1165 Type 2 diabetes mellitus with hyperglycemia: Secondary | ICD-10-CM

## 2022-12-11 DIAGNOSIS — E1142 Type 2 diabetes mellitus with diabetic polyneuropathy: Secondary | ICD-10-CM | POA: Diagnosis not present

## 2022-12-11 DIAGNOSIS — E1159 Type 2 diabetes mellitus with other circulatory complications: Secondary | ICD-10-CM

## 2022-12-11 LAB — POCT GLUCOSE (DEVICE FOR HOME USE): POC Glucose: 292 mg/dl — AB (ref 70–99)

## 2022-12-11 MED ORDER — DEXCOM G6 TRANSMITTER MISC: 1.0000 | 3 refills | Status: DC

## 2022-12-11 MED ORDER — DEXCOM G6 SENSOR MISC: 1.0000 | 3 refills | Status: DC

## 2022-12-11 NOTE — Progress Notes (Signed)
Name: Edward Schneider.  Age/ Sex: 67 y.o., male   MRN/ DOB: [id number], 21-Apr-1956     PCP: [name], NP   Reason for Endocrinology Evaluation: Type 2 Diabetes Mellitus  Initial Endocrine Consultative Visit: 01/24/2019    PATIENT IDENTIFIER: Edward Schneider. is a 67 y.o. male with a past medical history of T2DM, pancreatitis [id number] while on Trulicity . The patient has followed with Endocrinology clinic since 01/24/2019 for consultative assistance with management of his diabetes.  DIABETIC HISTORY:  Edward Schneider was diagnosed with T2DM in 2017. He has been on Jardiance in 2017 but due to cost was discontinued, as well as [place]. His hemoglobin A1c has ranged from 6.2% in 2018, peaking at 10.0 % in 2019.  On his initial visit to our clinic his A1c 10.9% , he was on metformin which we stopped in 04/2019 due to diarrhea.    Trulicity was discontinued in May 2023 due to acute pancreatitis  S/P PCI with DES 08/2019 SUBJECTIVE:   During the last visit (08/04/2022): A1c 10.4%.     Today (12/11/2022): Edward Schneider is here for a follow up on his diabetes management. He checks his blood sugars 2-3 times daily through freestyle libre. The patient have has not had a hypoglycemic episodes since the last clinic visit .   He was seen by podiatry in 08/10/2022 He continues to follow-up with orthopedics for right hip arthritis  Denies nausea, diarrhea but has nausea and constipation  Pt with sob and cough and pleurisy , pending appointment with new PCP  He is on CPAP   HOME DIABETES REGIMEN:  Basaglar 40 units QHS  Humalog 22/16/16 Farxiga 10 mg daily  Correction factor: Humalog (BG -130/25)    CONTINUOUS GLUCOSE MONITORING RECORD INTERPRETATION : He forgot his receiver today      DIABETIC COMPLICATIONS: Microvascular complications:  Neuropathy  Denies: CKD, retinopathy  Last eye exam: Completed 03/2022  Macrovascular complications:  CAD and CVA Denies: PVD    HISTORY:   Past Medical History:  Past Medical History:  Diagnosis Date   Allergy    Aortic atherosclerosis (HCC)    Asthma    C. difficile diarrhea    Chronic pain    Collagenous colitis    Coronary artery disease    a.) PCI with 2.75 x 18 mm Resolute Onyx DES x 1 to prox/mid LAD on 09/05/2019   DDD (degenerative disc disease), cervical    DDD (degenerative disc disease), lumbar    GERD (gastroesophageal reflux disease)    Grade I diastolic dysfunction    Hepatic steatosis    Hyperlipidemia    Hypertension    Liver cancer ([place]) 03/2015   Migraines    Myocardial infarction ([place])     OSA on CPAP    Seizures ([place])    several as child when sick.  None since age 68   Stroke [facility])    'mini-stroke" 30 yrs ago. no deficits.   T2DM (type 2 diabetes mellitus) ([place])    Wears dentures    full upper and lower   Past Surgical History:  Past Surgical History:  Procedure Laterality Date   APPENDECTOMY     BACK SURGERY     CARDIAC CATHETERIZATION     No stent placed in his "35's"   CERVICAL FUSION     COLONOSCOPY WITH PROPOFOL N/A 03/06/2016   Procedure: COLONOSCOPY WITH PROPOFOL;  Surgeon: Lucilla Lame, MD;  Location: [place];  Service: Endoscopy;  Laterality: N/A;  requests early   COLONOSCOPY WITH PROPOFOL N/A 06/18/2020   Procedure: COLONOSCOPY WITH PROPOFOL;  Surgeon: Lucilla Lame, MD;  Location[facility] ENDOSCOPY;  Service: Endoscopy;  Laterality: N/A;   ESOPHAGOGASTRODUODENOSCOPY (EGD) WITH PROPOFOL N/A 09/20/2017   Procedure: ESOPHAGOGASTRODUODENOSCOPY (EGD) WITH PROPOFOL;  Surgeon: Lucilla Lame, MD;  Location: [place];  Service: Endoscopy;  Laterality: N/A;  Diabetic - oral meds   FINGER SURGERY Left    INTRAVASCULAR PRESSURE WIRE/FFR STUDY N/A 09/05/2019   Procedure: INTRAVASCULAR PRESSURE WIRE/FFR STUDY;  Surgeon: Nelva Bush, MD;  Location: [place] [facility];  Service: Cardiovascular;  Laterality: N/A;   KNEE SURGERY Right    LEFT HEART CATH AND CORONARY  ANGIOGRAPHY Left 09/05/2019   Procedure: LEFT HEART CATH AND CORONARY ANGIOGRAPHY (2.75 x 18 mm Resolute Onyx DES x 1 to prox/mid LAD);  Surgeon: Nelva Bush, MD;  Location: [place] [facility];  Service: Cardiovascular;  Laterality: Left;   NECK SURGERY     spleen surgery     TOE SURGERY Right    TOTAL HIP ARTHROPLASTY Right 06/03/2021   Procedure: TOTAL HIP ARTHROPLASTY ANTERIOR APPROACH;  Surgeon: Hessie Knows, MD;  Location: [facility];  Service: Orthopedics;  Laterality: Right;   TOTAL KNEE ARTHROPLASTY Right 08/22/2021   Procedure: TOTAL KNEE ARTHROPLASTY;  Surgeon: Hessie Knows, MD;  Location: [facility] OR[facility];  Service: Orthopedics;  Laterality: Right;   Social History:  reports that he quit smoking about 13 years ago. His smoking use included cigarettes. He has a 100.00 pack-year smoking history. He has never used smokeless tobacco. He reports that he does not drink alcohol and does not use drugs. Family History:  Family History  Problem Relation Age of Onset   Arthritis Mother    Diabetes Mother    Kidney disease Mother    Heart disease Mother    Hypertension Mother    Arthritis Father    Hearing loss Father    Hypertension Father    Heart disease Father    Diabetes Sister    Heart disease Sister    Diabetes Daughter    Diabetes Maternal Aunt    Diabetes Maternal Grandmother    Heart Problems Brother    Heart Problems Brother    Heart Problems Brother    Heart attack Maternal Grandfather    Colon cancer Paternal Grandfather      HOME MEDICATIONS: Allergies as of 12/11/2022       Reactions   Bee Venom Anaphylaxis   Crestor [rosuvastatin Calcium] Shortness Of Breath, Swelling   Fentanyl Itching, Hives   blisters Patch   Gabapentin Diarrhea   Severe diarrhea which caused incontinence, loss of appetite and weight loss.   Shellfish Allergy Anaphylaxis, Swelling   Shrimp causes throat to swell and tingling in tongue.    Fire Ant    Furosemide Nausea And Vomiting    Buprenorphine Hcl Itching   Chlorhexidine Gluconate Itching, Rash   Simvastatin Diarrhea        Medication List        Accurate as of December 11, 2022 11:59 AM. If you have any questions, ask your nurse or doctor.          STOP taking these medications    sulfamethoxazole-trimethoprim 800-160 MG tablet Commonly known as: BACTRIM DS Stopped by: Dorita Sciara, MD       TAKE these medications    acetaminophen 650 MG CR tablet Commonly known as: TYLENOL Take 1,300 mg by mouth every 8 (eight) hours.  albuterol 108 (90 Base) MCG/ACT inhaler Commonly known as: VENTOLIN HFA INHALE 2 PUFFS INTO THE LUNGS EVERY 6 HOURS AS NEEDED FOR WHEEZING OR SHORTNESS OF BREATH   aspirin EC 81 MG tablet Take 81 mg by mouth daily. Swallow whole.   atorvastatin 80 MG tablet Commonly known as: LIPITOR Take 1 tablet (80 mg total) by mouth at bedtime.   Basaglar KwikPen 100 UNIT/ML Inject 32 Units into the skin daily.   benazepril 10 MG tablet Commonly known as: LOTENSIN Take 1 tablet (10 mg total) by mouth daily.   bisacodyl 5 MG EC tablet Commonly known as: Dulcolax Take 2 tablets (10 mg total) by mouth daily in the afternoon.   Breztri Aerosphere 160-9-4.8 MCG/ACT Aero Generic drug: Budeson-Glycopyrrol-Formoterol Inhale 2 puffs into the lungs in the morning and at bedtime.   calcium carbonate 600 MG Tabs tablet Commonly known as: OS-CAL Take 600 mg by mouth daily.   Centrum Silver 50+Men Tabs Take 1 tablet by mouth daily.   cetirizine 10 MG tablet Commonly known as: ZYRTEC Take 1 tablet (10 mg total) by mouth daily.   CINNAMON PO Take 1,000 mg by mouth 2 (two) times daily.   clobetasol cream 0.05 % Commonly known as: TEMOVATE Apply 1 application topically 2 (two) times daily.   Comfort EZ Pen Needles 32G X 4 MM Misc Generic drug: Insulin Pen Needle   cyanocobalamin 1000 MCG tablet Commonly known as: VITAMIN B12 Take 1,000 mcg by mouth daily.    cyclobenzaprine 10 MG tablet Commonly known as: FLEXERIL Take 1 tablet (10 mg total) by mouth 3 (three) times daily as needed for muscle spasms.   dapagliflozin propanediol 10 MG Tabs tablet Commonly known as: Farxiga Take 1 tablet (10 mg total) by mouth daily before breakfast.   diclofenac Sodium 1 % Gel Commonly known as: VOLTAREN Apply 2 g topically 4 (four) times daily as needed (pain).   diphenhydrAMINE 25 mg capsule Commonly known as: BENADRYL Take 50 mg by mouth in the morning, at noon, and at bedtime.   doxepin 25 MG capsule Commonly known as: SINEQUAN Take 2 capsules (50 mg total) by mouth at bedtime.   DULoxetine 60 MG capsule Commonly known as: CYMBALTA Take 1 capsule (60 mg total) by mouth at bedtime.   DULoxetine 30 MG capsule Commonly known as: CYMBALTA Take 30-60 mg by mouth daily as needed.   EPINEPHrine 0.3 mg/0.3 mL Soaj injection Commonly known as: EPI-PEN Inject 0.3 mg into the muscle as needed for anaphylaxis.   ezetimibe 10 MG tablet Commonly known as: ZETIA Take 1 tablet (10 mg total) by mouth daily.   famotidine 20 MG tablet Commonly known as: PEPCID TAKE 1 TABLET BY MOUTH TWICE DAILY   FreeStyle Libre 2 Sensor Misc Use as instructed to check blood sugar daily   GINGER PO Take 1 tablet by mouth daily.   Ginseng 100 MG Caps Take 100 mg by mouth daily.   IRON-C PO Take 30 mg by mouth daily.   isosorbide mononitrate 60 MG 24 hr tablet Commonly known as: IMDUR Take 1 tablet (60 mg total) by mouth daily.   lidocaine 2 % solution Commonly known as: XYLOCAINE Use as directed 15 mLs in the mouth or throat as needed for mouth pain.   Lyumjev KwikPen 100 UNIT/ML KwikPen Generic drug: Insulin Lispro-aabc 22 units with Breakfast and continue 16 units with Lunch and 16 units with supper   mometasone 0.1 % ointment Commonly known as: ELOCON Apply topically 2 (two) times  daily as needed.   mupirocin ointment 2 % Commonly known as:  BACTROBAN Apply 1 Application topically 2 (two) times daily.   naproxen sodium 220 MG tablet Commonly known as: ALEVE Take 440 mg by mouth daily as needed (body aches).   Narcan 4 MG/0.1ML Liqd nasal spray kit Generic drug: naloxone Place 0.4 mg into the nose as needed (opioid overdose).   nitroGLYCERIN 0.4 MG SL tablet Commonly known as: NITROSTAT Place 0.4 mg under the tongue every 5 (five) minutes as needed for chest pain.   Omega-3 1000 MG Caps Take 1,000 mg by mouth daily.   Omnipod 5 G6 Intro (Gen 5) Kit 1 Device by Does not apply route every other day.   Omnipod 5 G6 Pods (Gen 5) Misc 1 Device by Does not apply route every other day.   ondansetron 4 MG tablet Commonly known as: ZOFRAN TAKE 1 TABLET(4 MG) BY MOUTH EVERY 8 HOURS AS NEEDED FOR NAUSEA OR VOMITING   pantoprazole 40 MG tablet Commonly known as: PROTONIX TAKE 1 TABLET(40 MG) BY MOUTH TWICE DAILY   Pharmacist Choice Alcohol Pads SMARTSIG:1 Each Topical 4 Times Daily   polyethylene glycol 17 g packet Commonly known as: MIRALAX / GLYCOLAX Take 17 g by mouth 2 (two) times daily.   pregabalin 100 MG capsule Commonly known as: LYRICA TAKE 1 CAPSULE(100 MG) BY MOUTH TWICE DAILY   senna-docusate 8.6-50 MG tablet Commonly known as: Senokot-S Take 1 tablet by mouth 2 (two) times daily.   sucralfate 1 g tablet Commonly known as: CARAFATE TAKE 1 TABLET BY MOUTH 4 TIMES DAILY WITH MEALS AND AT BEDTIME   traZODone 100 MG tablet Commonly known as: DESYREL Take 1 tablet (100 mg total) by mouth at bedtime as needed. for sleep   Trulance 3 MG Tabs Generic drug: Plecanatide Take 1 tablet by mouth daily.   vitamin A 10000 UNIT capsule Take 10,000 Units by mouth daily.   vitamin C 1000 MG tablet Take 1,000 mg by mouth daily.   Vitamin D3 25 MCG (1000 UT) Chew Generic drug: Cholecalciferol Chew 1,000 Units by mouth daily.   Vitamin E 400 units Tabs Take 400 Units by mouth daily.          OBJECTIVE:   Vital Signs: BP 110/70 (BP Location: Left Arm, Patient Position: Sitting, Cuff Size: Large)   Pulse 100   Ht '5\' 7"'$  (1.702 m)   Wt 251 lb (113.9 kg)   SpO2 98%   BMI 39.31 kg/m   Wt Readings from Last 3 Encounters:  12/11/22 251 lb (113.9 kg)  08/04/22 247 lb (112 kg)  07/29/22 246 lb (111.6 kg)     Exam: General: Pt appears well and is in NAD  Lungs: Clear with good BS bilat with no rales, rhonchi, or wheezes  Heart: RRR   Extremities: No pretibial edema.    Neuro: MS is good with appropriate affect, pt is alert and Ox3       DM foot exam: 08/04/2022 The skin of the feet is without sores or ulcerations The pedal pulses are 1+ B/L The sensation is absent to a screening 5.07, 10 gram monofilament on the right          DATA REVIEWED:  Lab Results  Component Value Date   HGBA1C 10.4 (H) 07/08/2022   HGBA1C 9.8 (H) 03/05/2022   HGBA1C 8.3 (H) 08/23/2021    Latest Reference Range & Units 07/08/22 13:55  Sodium 134 - 144 mmol/L 135  Potassium 3.5 -  5.2 mmol/L 4.9  Chloride 96 - 106 mmol/L 99  CO2 20 - 29 mmol/L 19 (L)  Glucose 70 - 99 mg/dL 185 (H)  BUN 8 - 27 mg/dL 15  Creatinine 0.76 - 1.27 mg/dL 1.05  Calcium 8.6 - 10.2 mg/dL 9.7  BUN/Creatinine Ratio 10 - 24  14  eGFR >59 mL/min/1.73 79  Magnesium 1.6 - 2.3 mg/dL 1.8  Alkaline Phosphatase 44 - 121 IU/L 70  Albumin 3.9 - 4.9 g/dL 4.3  Albumin/Globulin Ratio 1.2 - 2.2  1.4  Amylase, Serum 31 - 110 U/L 79  Lipase 13 - 78 U/L 86 (H)  AST 0 - 40 IU/L 24  ALT 0 - 44 IU/L 23  Total Protein 6.0 - 8.5 g/dL 7.3  Total Bilirubin 0.0 - 1.2 mg/dL 0.4     Latest Reference Range & Units 07/08/22 13:55  Cholesterol, Total 100 - 199 mg/dL 119  HDL Cholesterol >39 mg/dL 47  Triglycerides 0 - 149 mg/dL 144  VLDL Cholesterol Cal 5 - 40 mg/dL 25  LDL Chol Calc (NIH) 0 - 99 mg/dL 47     ASSESSMENT / PLAN / RECOMMENDATIONS:   1) 1) Type 2 Diabetes Mellitus, Poorly  control, With neuropathic and  macrovascular complications - Most recent A1c of  9.7 %. Goal A1c < 7.0 %.     - Per pt , he recently had an A1c at PCPs office  9.7 % -Trulicity had to be discontinued due to pancreatitis in May 2023, hence all GLP-1 agonist including Mounjaro are contraindicated -He is on patient assistance program for his basal, prandial and [place] -Unfortunately he continues with hyperglycemia despite increasing his insulin doses, it appears that he has not been using the correction scale, today I reviewed the correction scale with him and the importance of using it before each meal -He did not qualify for the tandem pump, he did receive his OmniPod awaiting on training. -He will need to switch freestyle libre to [facility] G6, a prescription has been printed and will be faxed to DME supplier  MEDICATIONS:  Increase Basaglar  50 units daily  Increase Humalog 25 units with Breakfast , 20 units with Lunch and 20 units with supper Continue Farxiga 10 mg, 1 tablet with Breakfast  Start correction factor: Humalog (BG -130/25)     EDUCATION / INSTRUCTIONS: BG monitoring instructions: Patient is instructed to check his blood sugars 4 times a day, before meals . Call [place] [facility] if: BG persistently < 70 I reviewed the Rule of 15 for the treatment of hypoglycemia in detail with the patient. Literature supplied.    F/U in  6 months    Signed electronically by: Mack Guise, MD  [facility]  [address] [place], [place] [address] Phone: [phone] FAX: [phone]   CC: [name], NP [place] [address] Phone: [phone]  Fax: [phone]  Return to Endocrinology clinic as below: Future Appointments  Date Time Provider [place]  03/10/2023  9:45 AM Ralene Bathe, MD [facility] None

## 2022-12-11 NOTE — Telephone Encounter (Signed)
Edward Schneider,   Edward Schneider has finally received his Omnipod but still waiting on the dexcom    But he is ready to train   Thank you

## 2022-12-11 NOTE — Patient Instructions (Addendum)
-   Increase  Basaglar 50 units daily  - Continue Humalog 25 units with Breskfast and continue 20 units with Lunch and 20 units with Supper - Continue  Farxiga 10 mg , 1 tablet with Breakfast    -Humalog correctional insulin: ADD extra units on insulin to your meal-time Humalog dose if your blood sugars are higher than 155. Use the scale below to help guide you:   Blood sugar before meal Number of units to inject  Less than 155 0 unit  156 -  180 1 units  181 -  205 2 units  206 -  230 3 units  231 -  255 4 units  256 -  280 5 units  281 -  305 6 units  306 -  330 7 units  331 -  355 8 units    HOW TO TREAT LOW BLOOD SUGARS (Blood sugar LESS THAN 70 MG/DL) Please follow the RULE OF 15 for the treatment of hypoglycemia treatment (when your (blood sugars are less than 70 mg/dL)   STEP 1: Take 15 grams of carbohydrates when your blood sugar is low, which includes:  3-4 GLUCOSE TABS  OR 3-4 OZ OF JUICE OR REGULAR SODA OR ONE TUBE OF GLUCOSE GEL    STEP 2: RECHECK blood sugar in 15 MINUTES STEP 3: If your blood sugar is still low at the 15 minute recheck --> then, go back to STEP 1 and treat AGAIN with another 15 grams of carbohydrates.

## 2022-12-14 ENCOUNTER — Other Ambulatory Visit: Payer: Self-pay

## 2022-12-14 MED ORDER — DEXCOM G6 TRANSMITTER MISC: 1.0000 | 3 refills | Status: DC

## 2022-12-14 MED ORDER — DEXCOM G6 SENSOR MISC: 1.0000 | 3 refills | Status: DC

## 2022-12-15 NOTE — Telephone Encounter (Signed)
Dexcom sensor training scheduled for next week

## 2022-12-17 ENCOUNTER — Other Ambulatory Visit: Payer: Self-pay

## 2022-12-17 MED ORDER — OMNIPOD 5 DEXG7G6 PODS GEN 5 MISC: 3 refills | Status: DC

## 2022-12-18 ENCOUNTER — Other Ambulatory Visit: Payer: Self-pay | Admitting: Internal Medicine

## 2022-12-18 MED ORDER — INSULIN LISPRO 100 UNIT/ML IJ SOLN: INTRAMUSCULAR | 11 refills | Status: DC

## 2022-12-18 MED ORDER — INSULIN LISPRO 100 UNIT/ML IJ SOLN: INTRAMUSCULAR | 3 refills | Status: DC

## 2022-12-22 ENCOUNTER — Encounter: Payer: Medicare Other | Attending: Nurse Practitioner | Admitting: Nutrition

## 2022-12-22 DIAGNOSIS — E1142 Type 2 diabetes mellitus with diabetic polyneuropathy: Secondary | ICD-10-CM | POA: Insufficient documentation

## 2022-12-22 NOTE — Progress Notes (Signed)
Patient cam in wanting to start his Dexcom before his appointment in 2 weeks.  He did not have the sensors, only brought his pump starter kit I explained how the sensor will link to his pod and this will be controlled with his PDM in the box.  He was very confused.  Said he went to the pharmacy to pick up something, and it cost $900.00.  I called the pharmacy, and they said the transmitter is on back order and they did not know the cost of the sensors for him.  He was told to call me when his sensor and transmitter come in with the cost, and if this is too expensive we will decide what to do about his pump.  He agreed to do this and and left without any training.

## 2022-12-22 NOTE — Patient Instructions (Signed)
Call when CGM comes in and let me know if you want to continue with this pump

## 2022-12-25 ENCOUNTER — Other Ambulatory Visit: Payer: Self-pay | Admitting: Nurse Practitioner

## 2022-12-25 NOTE — Telephone Encounter (Signed)
Unable to refill per protocol, Rx request is too soon. Last refill 10/27/21 for 90 and 4 refills. Next refill due in April.  Requested Prescriptions  Pending Prescriptions Disp Refills   sucralfate (CARAFATE) 1 g tablet [Pharmacy Med Name: SUCRALFATE 1GM TABLETS] 360 tablet 4    Sig: TAKE 1 TABLET BY MOUTH FOUR TIMES DAILY AT BEDTIME WITH MEALS     Gastroenterology: Antiacids Passed - 12/25/2022 11:35 AM      Passed - Valid encounter within last 12 months    Recent Outpatient Visits           5 months ago Olecranon bursitis of left elbow   [place] [facility] Mecum, Erin E, PA-C   5 months ago Uncontrolled type 2 diabetes mellitus with hyperglycemia ([place])   [place], Thorsby T, NP   5 months ago Senile purpura ([place])   [place], Henrine Screws T, NP   5 months ago Cellulitis of left upper extremity   [place], NP   8 months ago Olecranon bursitis of left elbow   [place] Kathrine Haddock, NP       Future Appointments             In 2 months Ralene Bathe, MD [place]

## 2023-01-18 ENCOUNTER — Other Ambulatory Visit: Payer: Self-pay

## 2023-01-18 MED ORDER — COMFORT EZ PEN NEEDLES 32G X 4 MM MISC: 2 refills | Status: DC

## 2023-01-19 ENCOUNTER — Telehealth: Payer: Self-pay | Admitting: Nutrition

## 2023-01-19 ENCOUNTER — Encounter: Payer: Medicare Other | Attending: Nurse Practitioner | Admitting: Nutrition

## 2023-01-19 DIAGNOSIS — E1165 Type 2 diabetes mellitus with hyperglycemia: Secondary | ICD-10-CM

## 2023-01-19 NOTE — Patient Instructions (Signed)
Call when you get Your dexcom sensors and transmitter

## 2023-01-19 NOTE — Telephone Encounter (Signed)
Patient says he gets his insulin from [facility] and would like the script for the insulin in vials to be sent there.  He pays nothing from there.  He would also like to the dexcom G6 sensors and transmitters sent from there. I called them [phone]) and they are able to send the sensors to him.  They will be sending the fax over for signature.

## 2023-01-19 NOTE — Progress Notes (Signed)
Patient came in with his box of pump supplies and no CGM.  Says "it never came".  Also says he gets his insulin in the mail from [facility], and it costs him nothing.  Told him I will call them when they open (they are in [place] and not open yet), and try to order the dexcoms G6s from them in stead of [facility] and see if the price is different.  He will call me when they come in.  Telephone number given to call me.

## 2023-01-20 MED ORDER — DEXCOM G6 TRANSMITTER MISC: 1.0000 | 3 refills | Status: DC

## 2023-01-20 MED ORDER — INSULIN LISPRO 100 UNIT/ML IJ SOLN: INTRAMUSCULAR | 3 refills | Status: DC

## 2023-01-20 MED ORDER — DEXCOM G6 SENSOR MISC: 1.0000 | 3 refills | Status: DC

## 2023-01-20 NOTE — Telephone Encounter (Signed)
Script has been sent to [facility] as requested

## 2023-01-22 ENCOUNTER — Other Ambulatory Visit: Payer: Self-pay | Admitting: Nurse Practitioner

## 2023-01-22 NOTE — Telephone Encounter (Signed)
Requested medication (s) are due for refill today:   Provider to review  Requested medication (s) are on the active medication list:   Yes for both  Future visit scheduled:   No   Last ordered: Carafate 10/27/2021 #360, 4 refills;   Zofran 10/23/2022 #60, 3 refills  Non delegated refill   Requested Prescriptions  Pending Prescriptions Disp Refills   sucralfate (CARAFATE) 1 g tablet [Pharmacy Med Name: SUCRALFATE 1GM TABLETS] 360 tablet 4    Sig: TAKE 1 TABLET BY MOUTH FOUR TIMES DAILY AT BEDTIME WITH MEALS     Gastroenterology: Antiacids Passed - 01/22/2023  3:20 PM      Passed - Valid encounter within last 12 months    Recent Outpatient Visits           5 months ago Olecranon bursitis of left elbow   [place] [facility] Mecum, Erin E, PA-C   6 months ago Uncontrolled type 2 diabetes mellitus with hyperglycemia (HCC)   [place] [facility] [place], Randall T, NP   6 months ago Senile purpura (HCC)   [place] [facility] [place], Corrie Dandy T, NP   6 months ago Cellulitis of left upper extremity   [place] [facility] Larae Grooms, NP   9 months ago Olecranon bursitis of left elbow   [place] [facility] Gabriel Cirri, NP       Future Appointments             In 1 month Deirdre Evener, MD [facility]             ondansetron (ZOFRAN) 4 MG tablet [Pharmacy Med Name: ONDANSETRON 4MG  TABLETS] 60 tablet 3    Sig: TAKE 1 TABLET(4 MG) BY MOUTH EVERY 8 HOURS AS NEEDED FOR NAUSEA OR VOMITING     Not Delegated - Gastroenterology: Antiemetics - ondansetron Failed - 01/22/2023  3:20 PM      Failed - This refill cannot be delegated      Failed - Valid encounter within last 6 months    Recent Outpatient Visits           5 months ago Olecranon bursitis of left elbow   [place] [facility] Mecum, Erin E, PA-C   6 months ago Uncontrolled type 2 diabetes mellitus  with hyperglycemia (HCC)   [place] [facility] [place], Benson T, NP   6 months ago Senile purpura (HCC)   [place] [facility] [place], Corrie Dandy T, NP   6 months ago Cellulitis of left upper extremity   [facility] Larae Grooms, NP   9 months ago Olecranon bursitis of left elbow   [place] [facility] Gabriel Cirri, NP       Future Appointments             In 1 month Deirdre Evener, MD [place] [facility]            Passed - AST in normal range and within 360 days    AST  Date Value Ref Range Status  07/08/2022 24 0 - 40 IU/L Final   SGOT(AST)  Date Value Ref Range Status  02/28/2014 20 15 - 37 Unit/L Final   AST (SGOT) Piccolo, Waived  Date Value Ref Range Status  11/03/2017 51 (H) 11 - 38 U/L Final         Passed - ALT in normal range  and within 360 days    ALT  Date Value Ref Range Status  07/08/2022 23 0 - 44 IU/L Final   SGPT (ALT)  Date Value Ref Range Status  02/28/2014 25 12 - 78 U/L Final   ALT (SGPT) Piccolo, Waived  Date Value Ref Range Status  11/03/2017 58 (H) 10 - 47 U/L Final

## 2023-02-02 ENCOUNTER — Emergency Department: Payer: Medicare Other

## 2023-02-02 ENCOUNTER — Other Ambulatory Visit: Payer: Self-pay

## 2023-02-02 ENCOUNTER — Inpatient Hospital Stay
Admission: EM | Admit: 2023-02-02 | Discharge: 2023-02-04 | DRG: 439 | Disposition: A | Discharge status: Home / Self Care | Payer: Medicare Other | Attending: Internal Medicine | Admitting: Internal Medicine

## 2023-02-02 ENCOUNTER — Encounter: Payer: Self-pay | Admitting: Intensive Care

## 2023-02-02 DIAGNOSIS — Z96651 Presence of right artificial knee joint: Secondary | ICD-10-CM

## 2023-02-02 DIAGNOSIS — Z8261 Family history of arthritis: Secondary | ICD-10-CM

## 2023-02-02 DIAGNOSIS — E1165 Type 2 diabetes mellitus with hyperglycemia: Secondary | ICD-10-CM | POA: Diagnosis present

## 2023-02-02 DIAGNOSIS — Z79899 Other long term (current) drug therapy: Secondary | ICD-10-CM

## 2023-02-02 DIAGNOSIS — Z822 Family history of deafness and hearing loss: Secondary | ICD-10-CM

## 2023-02-02 DIAGNOSIS — Z885 Allergy status to narcotic agent status: Secondary | ICD-10-CM

## 2023-02-02 DIAGNOSIS — Z794 Long term (current) use of insulin: Secondary | ICD-10-CM | POA: Diagnosis not present

## 2023-02-02 DIAGNOSIS — Z87892 Personal history of anaphylaxis: Secondary | ICD-10-CM

## 2023-02-02 DIAGNOSIS — K859 Acute pancreatitis without necrosis or infection, unspecified: Secondary | ICD-10-CM | POA: Diagnosis present

## 2023-02-02 DIAGNOSIS — G8921 Chronic pain due to trauma: Secondary | ICD-10-CM | POA: Diagnosis present

## 2023-02-02 DIAGNOSIS — F321 Major depressive disorder, single episode, moderate: Secondary | ICD-10-CM | POA: Diagnosis present

## 2023-02-02 DIAGNOSIS — K5900 Constipation, unspecified: Secondary | ICD-10-CM | POA: Diagnosis present

## 2023-02-02 DIAGNOSIS — I2510 Atherosclerotic heart disease of native coronary artery without angina pectoris: Secondary | ICD-10-CM | POA: Diagnosis present

## 2023-02-02 DIAGNOSIS — K8500 Idiopathic acute pancreatitis without necrosis or infection: Secondary | ICD-10-CM | POA: Diagnosis not present

## 2023-02-02 DIAGNOSIS — Z79891 Long term (current) use of opiate analgesic: Secondary | ICD-10-CM

## 2023-02-02 DIAGNOSIS — G8929 Other chronic pain: Secondary | ICD-10-CM | POA: Diagnosis present

## 2023-02-02 DIAGNOSIS — E1142 Type 2 diabetes mellitus with diabetic polyneuropathy: Secondary | ICD-10-CM | POA: Diagnosis present

## 2023-02-02 DIAGNOSIS — J4489 Other specified chronic obstructive pulmonary disease: Secondary | ICD-10-CM | POA: Diagnosis present

## 2023-02-02 DIAGNOSIS — D72829 Elevated white blood cell count, unspecified: Secondary | ICD-10-CM | POA: Diagnosis present

## 2023-02-02 DIAGNOSIS — E785 Hyperlipidemia, unspecified: Secondary | ICD-10-CM | POA: Diagnosis present

## 2023-02-02 DIAGNOSIS — Z888 Allergy status to other drugs, medicaments and biological substances status: Secondary | ICD-10-CM

## 2023-02-02 DIAGNOSIS — G4733 Obstructive sleep apnea (adult) (pediatric): Secondary | ICD-10-CM | POA: Diagnosis present

## 2023-02-02 DIAGNOSIS — Z800 Family history of malignant neoplasm of digestive organs: Secondary | ICD-10-CM

## 2023-02-02 DIAGNOSIS — G4700 Insomnia, unspecified: Secondary | ICD-10-CM | POA: Diagnosis present

## 2023-02-02 DIAGNOSIS — E1169 Type 2 diabetes mellitus with other specified complication: Secondary | ICD-10-CM | POA: Diagnosis present

## 2023-02-02 DIAGNOSIS — Z91030 Bee allergy status: Secondary | ICD-10-CM

## 2023-02-02 DIAGNOSIS — Z9989 Dependence on other enabling machines and devices: Secondary | ICD-10-CM | POA: Diagnosis not present

## 2023-02-02 DIAGNOSIS — K219 Gastro-esophageal reflux disease without esophagitis: Secondary | ICD-10-CM | POA: Diagnosis present

## 2023-02-02 DIAGNOSIS — I10 Essential (primary) hypertension: Secondary | ICD-10-CM | POA: Diagnosis present

## 2023-02-02 DIAGNOSIS — N400 Enlarged prostate without lower urinary tract symptoms: Secondary | ICD-10-CM | POA: Diagnosis present

## 2023-02-02 DIAGNOSIS — Z7982 Long term (current) use of aspirin: Secondary | ICD-10-CM

## 2023-02-02 DIAGNOSIS — K760 Fatty (change of) liver, not elsewhere classified: Secondary | ICD-10-CM | POA: Diagnosis present

## 2023-02-02 DIAGNOSIS — Z96641 Presence of right artificial hip joint: Secondary | ICD-10-CM | POA: Diagnosis present

## 2023-02-02 DIAGNOSIS — M5136 Other intervertebral disc degeneration, lumbar region: Secondary | ICD-10-CM | POA: Diagnosis present

## 2023-02-02 DIAGNOSIS — Z91013 Allergy to seafood: Secondary | ICD-10-CM

## 2023-02-02 DIAGNOSIS — Z8505 Personal history of malignant neoplasm of liver: Secondary | ICD-10-CM

## 2023-02-02 DIAGNOSIS — Z6837 Body mass index (BMI) 37.0-37.9, adult: Secondary | ICD-10-CM | POA: Diagnosis not present

## 2023-02-02 DIAGNOSIS — Z8249 Family history of ischemic heart disease and other diseases of the circulatory system: Secondary | ICD-10-CM

## 2023-02-02 DIAGNOSIS — I252 Old myocardial infarction: Secondary | ICD-10-CM

## 2023-02-02 DIAGNOSIS — M5441 Lumbago with sciatica, right side: Secondary | ICD-10-CM | POA: Diagnosis not present

## 2023-02-02 DIAGNOSIS — Z8673 Personal history of transient ischemic attack (TIA), and cerebral infarction without residual deficits: Secondary | ICD-10-CM

## 2023-02-02 DIAGNOSIS — I25118 Atherosclerotic heart disease of native coronary artery with other forms of angina pectoris: Secondary | ICD-10-CM | POA: Diagnosis present

## 2023-02-02 DIAGNOSIS — Z833 Family history of diabetes mellitus: Secondary | ICD-10-CM

## 2023-02-02 DIAGNOSIS — Z955 Presence of coronary angioplasty implant and graft: Secondary | ICD-10-CM

## 2023-02-02 DIAGNOSIS — Z87891 Personal history of nicotine dependence: Secondary | ICD-10-CM

## 2023-02-02 DIAGNOSIS — K861 Other chronic pancreatitis: Secondary | ICD-10-CM | POA: Diagnosis present

## 2023-02-02 DIAGNOSIS — M5442 Lumbago with sciatica, left side: Secondary | ICD-10-CM | POA: Diagnosis not present

## 2023-02-02 DIAGNOSIS — Z841 Family history of disorders of kidney and ureter: Secondary | ICD-10-CM

## 2023-02-02 DIAGNOSIS — G4730 Sleep apnea, unspecified: Secondary | ICD-10-CM | POA: Diagnosis present

## 2023-02-02 DIAGNOSIS — Z8719 Personal history of other diseases of the digestive system: Secondary | ICD-10-CM

## 2023-02-02 LAB — CBC
HCT: 38.5 % — ABNORMAL LOW (ref 39.0–52.0)
Hemoglobin: 12.2 g/dL — ABNORMAL LOW (ref 13.0–17.0)
MCH: 27.5 pg (ref 26.0–34.0)
MCHC: 31.7 g/dL (ref 30.0–36.0)
MCV: 86.9 fL (ref 80.0–100.0)
Platelets: 175 10*3/uL (ref 150–400)
RBC: 4.43 MIL/uL (ref 4.22–5.81)
RDW: 14.5 % (ref 11.5–15.5)
WBC: 13.8 10*3/uL — ABNORMAL HIGH (ref 4.0–10.5)
nRBC: 0 % (ref 0.0–0.2)

## 2023-02-02 LAB — URINALYSIS, ROUTINE W REFLEX MICROSCOPIC
Bacteria, UA: NONE SEEN
Bilirubin Urine: NEGATIVE
Glucose, UA: >=500 mg/dL — AB
Hgb urine dipstick: NEGATIVE
Ketones, ur: NEGATIVE mg/dL
Leukocytes,Ua: NEGATIVE
Nitrite: NEGATIVE
Protein, ur: NEGATIVE mg/dL
Specific Gravity, Urine: >1.046 — ABNORMAL HIGH (ref 1.005–1.030)
Squamous Epithelial / HPF: NONE SEEN /HPF (ref 0–5)
pH: 5 (ref 5.0–8.0)

## 2023-02-02 LAB — COMPREHENSIVE METABOLIC PANEL
ALT: 21 U/L (ref 0–44)
AST: 24 U/L (ref 15–41)
Albumin: 3.8 g/dL (ref 3.5–5.0)
Alkaline Phosphatase: 55 U/L (ref 38–126)
Anion gap: 12 (ref 5–15)
BUN: 17 mg/dL (ref 8–23)
CO2: 24 mmol/L (ref 22–32)
Calcium: 9.2 mg/dL (ref 8.9–10.3)
Chloride: 97 mmol/L — ABNORMAL LOW (ref 98–111)
Creatinine, Ser: 1.13 mg/dL (ref 0.61–1.24)
GFR, Estimated: >60 mL/min (ref >60)
Glucose, Bld: 395 mg/dL — ABNORMAL HIGH (ref 70–99)
Potassium: 4.3 mmol/L (ref 3.5–5.1)
Sodium: 133 mmol/L — ABNORMAL LOW (ref 135–145)
Total Bilirubin: 1 mg/dL (ref 0.3–1.2)
Total Protein: 7.5 g/dL (ref 6.5–8.1)

## 2023-02-02 LAB — HEMOGLOBIN A1C
Hgb A1c MFr Bld: 10.6 % — ABNORMAL HIGH (ref 4.8–5.6)
Mean Plasma Glucose: 257.52 mg/dL

## 2023-02-02 LAB — GLUCOSE, CAPILLARY
Glucose-Capillary: 226 mg/dL — ABNORMAL HIGH (ref 70–99)
Glucose-Capillary: 306 mg/dL — ABNORMAL HIGH (ref 70–99)

## 2023-02-02 LAB — TROPONIN I (HIGH SENSITIVITY)
Troponin I (High Sensitivity): 5 ng/L (ref <18)
Troponin I (High Sensitivity): 4 ng/L (ref <18)

## 2023-02-02 LAB — LIPASE, BLOOD: Lipase: 41 U/L (ref 11–51)

## 2023-02-02 MED ORDER — ADULT MULTIVITAMIN W/MINERALS CH
1.0000 | ORAL_TABLET | Freq: Every day | ORAL | Status: DC
  Administered 2023-02-03 – 2023-02-04 (×2): 1 via ORAL
  Filled 2023-02-02 (×2): qty 1

## 2023-02-02 MED ORDER — PLECANATIDE 3 MG PO TABS: 1.0000 | ORAL_TABLET | Freq: Every day | ORAL | Status: DC

## 2023-02-02 MED ORDER — SENNOSIDES-DOCUSATE SODIUM 8.6-50 MG PO TABS
1.0000 | ORAL_TABLET | Freq: Every evening | ORAL | Status: DC | PRN
  Administered 2023-02-02: 1 via ORAL

## 2023-02-02 MED ORDER — OXYCODONE HCL ER 10 MG PO T12A
10.0000 mg | EXTENDED_RELEASE_TABLET | Freq: Two times a day (BID) | ORAL | Status: DC
  Administered 2023-02-02 – 2023-02-04 (×4): 10 mg via ORAL
  Filled 2023-02-02 (×4): qty 1

## 2023-02-02 MED ORDER — VITAMIN D 25 MCG (1000 UNIT) PO TABS
1000.0000 [IU] | ORAL_TABLET | Freq: Every day | ORAL | Status: DC
  Administered 2023-02-03 – 2023-02-04 (×2): 1000 [IU] via ORAL
  Filled 2023-02-02 (×2): qty 1

## 2023-02-02 MED ORDER — ONDANSETRON HCL 4 MG PO TABS: 4.0000 mg | ORAL_TABLET | Freq: Four times a day (QID) | ORAL | Status: DC | PRN

## 2023-02-02 MED ORDER — MELATONIN 5 MG PO TABS: 5.0000 mg | ORAL_TABLET | Freq: Every evening | ORAL | Status: DC | PRN

## 2023-02-02 MED ORDER — DULOXETINE HCL 30 MG PO CPEP
60.0000 mg | ORAL_CAPSULE | Freq: Every day | ORAL | Status: DC
  Administered 2023-02-02 – 2023-02-03 (×2): 60 mg via ORAL
  Filled 2023-02-02 (×2): qty 2

## 2023-02-02 MED ORDER — MORPHINE SULFATE (PF) 4 MG/ML IV SOLN
4.0000 mg | INTRAVENOUS | Status: AC | PRN
  Administered 2023-02-02 – 2023-02-03 (×3): 4 mg via INTRAVENOUS
  Filled 2023-02-02 (×3): qty 1

## 2023-02-02 MED ORDER — BISACODYL 5 MG PO TBEC
10.0000 mg | DELAYED_RELEASE_TABLET | Freq: Every day | ORAL | Status: DC
  Administered 2023-02-02 – 2023-02-03 (×2): 10 mg via ORAL
  Filled 2023-02-02 (×2): qty 2

## 2023-02-02 MED ORDER — OMEGA-3-ACID ETHYL ESTERS 1 G PO CAPS
1000.0000 mg | ORAL_CAPSULE | Freq: Every day | ORAL | Status: DC
  Administered 2023-02-03 – 2023-02-04 (×2): 1000 mg via ORAL
  Filled 2023-02-02 (×2): qty 1

## 2023-02-02 MED ORDER — PREGABALIN 75 MG PO CAPS
75.0000 mg | ORAL_CAPSULE | Freq: Two times a day (BID) | ORAL | Status: DC
  Administered 2023-02-02 – 2023-02-04 (×4): 75 mg via ORAL
  Filled 2023-02-02 (×4): qty 1

## 2023-02-02 MED ORDER — EZETIMIBE 10 MG PO TABS
10.0000 mg | ORAL_TABLET | Freq: Every day | ORAL | Status: DC
  Administered 2023-02-02 – 2023-02-04 (×3): 10 mg via ORAL
  Filled 2023-02-02 (×3): qty 1

## 2023-02-02 MED ORDER — LACTATED RINGERS IV SOLN: INTRAVENOUS | Status: AC

## 2023-02-02 MED ORDER — ACETAMINOPHEN 650 MG RE SUPP: 650.0000 mg | Freq: Four times a day (QID) | RECTAL | Status: DC | PRN

## 2023-02-02 MED ORDER — ONDANSETRON HCL 4 MG/2ML IJ SOLN: 4.0000 mg | Freq: Four times a day (QID) | INTRAMUSCULAR | Status: DC | PRN

## 2023-02-02 MED ORDER — SODIUM CHLORIDE 0.9 % IV BOLUS
1000.0000 mL | Freq: Once | INTRAVENOUS | Status: AC
  Administered 2023-02-02: 1000 mL via INTRAVENOUS

## 2023-02-02 MED ORDER — VITAMIN E 180 MG (400 UNIT) PO CAPS
400.0000 [IU] | ORAL_CAPSULE | Freq: Every day | ORAL | Status: DC
  Administered 2023-02-03 – 2023-02-04 (×2): 400 [IU] via ORAL
  Filled 2023-02-02 (×2): qty 1

## 2023-02-02 MED ORDER — SENNOSIDES-DOCUSATE SODIUM 8.6-50 MG PO TABS
1.0000 | ORAL_TABLET | Freq: Two times a day (BID) | ORAL | Status: DC
  Administered 2023-02-02 – 2023-02-04 (×4): 1 via ORAL
  Filled 2023-02-02 (×4): qty 1

## 2023-02-02 MED ORDER — VITAMIN B-12 1000 MCG PO TABS
1000.0000 ug | ORAL_TABLET | Freq: Every day | ORAL | Status: DC
  Administered 2023-02-03 – 2023-02-04 (×2): 1000 ug via ORAL
  Filled 2023-02-02 (×2): qty 1

## 2023-02-02 MED ORDER — VITAMIN C 500 MG PO TABS
1000.0000 mg | ORAL_TABLET | Freq: Every day | ORAL | Status: DC
  Administered 2023-02-03 – 2023-02-04 (×2): 1000 mg via ORAL
  Filled 2023-02-02 (×2): qty 2

## 2023-02-02 MED ORDER — POLYETHYLENE GLYCOL 3350 17 G PO PACK
17.0000 g | PACK | Freq: Two times a day (BID) | ORAL | Status: DC
  Administered 2023-02-02 – 2023-02-04 (×4): 17 g via ORAL
  Filled 2023-02-02 (×4): qty 1

## 2023-02-02 MED ORDER — DICLOFENAC SODIUM 1 % EX GEL: 2.0000 g | Freq: Four times a day (QID) | CUTANEOUS | Status: DC | PRN

## 2023-02-02 MED ORDER — FAMOTIDINE 20 MG PO TABS
20.0000 mg | ORAL_TABLET | Freq: Two times a day (BID) | ORAL | Status: DC
  Administered 2023-02-02 – 2023-02-04 (×4): 20 mg via ORAL
  Filled 2023-02-02 (×4): qty 1

## 2023-02-02 MED ORDER — LACTATED RINGERS IV BOLUS
1000.0000 mL | Freq: Once | INTRAVENOUS | Status: AC
  Administered 2023-02-02: 1000 mL via INTRAVENOUS

## 2023-02-02 MED ORDER — ASPIRIN 81 MG PO TBEC
81.0000 mg | DELAYED_RELEASE_TABLET | Freq: Every day | ORAL | Status: DC
  Administered 2023-02-02 – 2023-02-04 (×3): 81 mg via ORAL
  Filled 2023-02-02 (×3): qty 1

## 2023-02-02 MED ORDER — TRAZODONE HCL 100 MG PO TABS
100.0000 mg | ORAL_TABLET | Freq: Every evening | ORAL | Status: DC | PRN
  Administered 2023-02-03: 100 mg via ORAL
  Filled 2023-02-02: qty 1

## 2023-02-02 MED ORDER — ACETAMINOPHEN 325 MG PO TABS: 650.0000 mg | ORAL_TABLET | Freq: Four times a day (QID) | ORAL | Status: DC | PRN

## 2023-02-02 MED ORDER — DOXEPIN HCL 50 MG PO CAPS
50.0000 mg | ORAL_CAPSULE | Freq: Every day | ORAL | Status: DC
  Administered 2023-02-02 – 2023-02-03 (×2): 50 mg via ORAL
  Filled 2023-02-02 (×2): qty 1

## 2023-02-02 MED ORDER — ISOSORBIDE MONONITRATE ER 30 MG PO TB24
60.0000 mg | ORAL_TABLET | Freq: Every day | ORAL | Status: DC
  Administered 2023-02-02 – 2023-02-04 (×3): 60 mg via ORAL
  Filled 2023-02-02 (×4): qty 2

## 2023-02-02 MED ORDER — BUDESON-GLYCOPYRROL-FORMOTEROL 160-9-4.8 MCG/ACT IN AERO: 2.0000 | INHALATION_SPRAY | Freq: Two times a day (BID) | RESPIRATORY_TRACT | Status: DC

## 2023-02-02 MED ORDER — BENAZEPRIL HCL 10 MG PO TABS
10.0000 mg | ORAL_TABLET | Freq: Every day | ORAL | Status: DC
  Administered 2023-02-03 – 2023-02-04 (×2): 10 mg via ORAL
  Filled 2023-02-02 (×2): qty 1

## 2023-02-02 MED ORDER — SUCRALFATE 1 G PO TABS
1.0000 g | ORAL_TABLET | Freq: Three times a day (TID) | ORAL | Status: DC
  Administered 2023-02-02 – 2023-02-04 (×7): 1 g via ORAL
  Filled 2023-02-02 (×7): qty 1

## 2023-02-02 MED ORDER — IOHEXOL 300 MG/ML  SOLN
100.0000 mL | Freq: Once | INTRAMUSCULAR | Status: AC | PRN
  Administered 2023-02-02: 100 mL via INTRAVENOUS

## 2023-02-02 MED ORDER — DIPHENHYDRAMINE HCL 25 MG PO CAPS: 50.0000 mg | ORAL_CAPSULE | Freq: Three times a day (TID) | ORAL | Status: DC | PRN

## 2023-02-02 MED ORDER — HYDROMORPHONE HCL 1 MG/ML IJ SOLN
0.5000 mg | Freq: Once | INTRAMUSCULAR | Status: AC
  Administered 2023-02-02: 0.5 mg via INTRAVENOUS
  Filled 2023-02-02: qty 0.5

## 2023-02-02 MED ORDER — VITAMIN A 3 MG (10000 UNIT) PO CAPS
10000.0000 [IU] | ORAL_CAPSULE | Freq: Every day | ORAL | Status: DC
  Administered 2023-02-03 – 2023-02-04 (×2): 10000 [IU] via ORAL
  Filled 2023-02-02 (×2): qty 1

## 2023-02-02 MED ORDER — UMECLIDINIUM-VILANTEROL 62.5-25 MCG/ACT IN AEPB
1.0000 | INHALATION_SPRAY | Freq: Every day | RESPIRATORY_TRACT | Status: DC
  Administered 2023-02-03 – 2023-02-04 (×2): 1 via RESPIRATORY_TRACT
  Filled 2023-02-02: qty 14

## 2023-02-02 MED ORDER — ATORVASTATIN CALCIUM 20 MG PO TABS
80.0000 mg | ORAL_TABLET | Freq: Every day | ORAL | Status: DC
  Administered 2023-02-02 – 2023-02-03 (×2): 80 mg via ORAL
  Filled 2023-02-02 (×2): qty 4

## 2023-02-02 MED ORDER — ALBUTEROL SULFATE (2.5 MG/3ML) 0.083% IN NEBU: 3.0000 mL | INHALATION_SOLUTION | Freq: Four times a day (QID) | RESPIRATORY_TRACT | Status: DC | PRN

## 2023-02-02 MED ORDER — INSULIN ASPART 100 UNIT/ML IJ SOLN
0.0000 [IU] | Freq: Three times a day (TID) | INTRAMUSCULAR | Status: DC
  Administered 2023-02-02 – 2023-02-03 (×3): 7 [IU] via SUBCUTANEOUS
  Filled 2023-02-02 (×3): qty 1

## 2023-02-02 MED ORDER — ENOXAPARIN SODIUM 60 MG/0.6ML IJ SOSY
0.5000 mg/kg | PREFILLED_SYRINGE | INTRAMUSCULAR | Status: DC
  Administered 2023-02-02 – 2023-02-03 (×2): 57.5 mg via SUBCUTANEOUS
  Filled 2023-02-02 (×2): qty 0.6

## 2023-02-02 MED ORDER — NITROGLYCERIN 0.4 MG SL SUBL: 0.4000 mg | SUBLINGUAL_TABLET | SUBLINGUAL | Status: DC | PRN

## 2023-02-02 MED ORDER — INSULIN ASPART 100 UNIT/ML IJ SOLN
0.0000 [IU] | Freq: Every day | INTRAMUSCULAR | Status: DC
  Administered 2023-02-02: 4 [IU] via SUBCUTANEOUS
  Administered 2023-02-03: 2 [IU] via SUBCUTANEOUS
  Filled 2023-02-02 (×2): qty 1

## 2023-02-02 MED ORDER — PANTOPRAZOLE SODIUM 40 MG PO TBEC
40.0000 mg | DELAYED_RELEASE_TABLET | Freq: Every day | ORAL | Status: DC
  Administered 2023-02-03 – 2023-02-04 (×2): 40 mg via ORAL
  Filled 2023-02-02 (×2): qty 1

## 2023-02-02 MED ORDER — DAPAGLIFLOZIN PROPANEDIOL 10 MG PO TABS
10.0000 mg | ORAL_TABLET | Freq: Every day | ORAL | Status: DC
  Administered 2023-02-03 – 2023-02-04 (×2): 10 mg via ORAL
  Filled 2023-02-02 (×2): qty 1

## 2023-02-02 MED ORDER — HYDROMORPHONE HCL 1 MG/ML IJ SOLN
0.5000 mg | INTRAMUSCULAR | Status: AC | PRN
  Administered 2023-02-02 – 2023-02-03 (×3): 0.5 mg via INTRAVENOUS
  Filled 2023-02-02 (×3): qty 0.5

## 2023-02-02 MED ORDER — CYCLOBENZAPRINE HCL 10 MG PO TABS: 10.0000 mg | ORAL_TABLET | Freq: Three times a day (TID) | ORAL | Status: DC | PRN

## 2023-02-02 MED ORDER — BUDESONIDE 0.25 MG/2ML IN SUSP
0.2500 mg | Freq: Two times a day (BID) | RESPIRATORY_TRACT | Status: DC
  Administered 2023-02-02 – 2023-02-04 (×3): 0.25 mg via RESPIRATORY_TRACT
  Filled 2023-02-02 (×3): qty 2

## 2023-02-02 MED ORDER — ONDANSETRON HCL 4 MG/2ML IJ SOLN
4.0000 mg | Freq: Once | INTRAMUSCULAR | Status: AC
  Administered 2023-02-02: 4 mg via INTRAVENOUS
  Filled 2023-02-02: qty 2

## 2023-02-02 MED ORDER — CALCIUM CARBONATE 1250 (500 CA) MG PO TABS
1250.0000 mg | ORAL_TABLET | Freq: Every day | ORAL | Status: DC
  Administered 2023-02-03 – 2023-02-04 (×2): 1250 mg via ORAL
  Filled 2023-02-02 (×2): qty 1

## 2023-02-02 NOTE — Hospital Course (Signed)
[name], Edward Schneider. is a 67 year old male with history of pancreatitis, hypertension, independent diabetes mellitus, depression, anxiety, GERD, CAD status post DES PCI to LAD in November 2020, COPD, former tobacco user, hyperlipidemia, who presents emergency department for chief concerns of abdominal pain.  Vitals showed temperature of 97.9, respiration rate of 16, heart rate 91, blood pressure 104 over 69, SpO2 95% on room air.  Serum sodium is 133, potassium 4.3, chloride 97, bicarb 24, BUN of 17, serum creatinine 1.13, EGFR greater than 60, nonfasting blood glucose 395, WBC 13.8, hemoglobin 12.2, platelets of 125.  Anion gap was 12.  UA was negative for leukocytes and nitrates.  Lipase level was WNL at 41.  ED treatment: Dilaudid 0.5 mg IV, 2 doses given ondansetron 4 mg IV, sodium chloride 1 L bolus one-time dose.

## 2023-02-02 NOTE — Assessment & Plan Note (Signed)
Resolved. Query reactive in setting of acute pancreatitis No clinical signs for infectious etiology at this time Continue to monitor

## 2023-02-02 NOTE — Progress Notes (Signed)
Inpatient Diabetes Program Recommendations  AACE/ADA: New Consensus Statement on Inpatient Glycemic Control (2015)  Target Ranges:  Prepandial:   less than 140 mg/dL      Peak postprandial:   less than 180 mg/dL (1-2 hours)      Critically ill patients:  140 - 180 mg/dL   Lab Results  Component Value Date   GLUCAP 283 (H) 03/06/2022   HGBA1C 10.4 (H) 07/08/2022    Review of Glycemic Control  Latest Reference Range & Units 02/02/23 12:08  Glucose 70 - 99 mg/dL 161 (H)   Diabetes history: DM 2 Outpatient Diabetes medications:  Basaglar 40 units QHS  Humalog 22/16/16 Farxiga 10 mg daily  Correction factor: Humalog (BG -130/25) Patient will start insulin pump/omnipod next week Current orders for Inpatient glycemic control:  None Inpatient Diabetes Program Recommendations:    If patient is to be admitted or held in the ED, consider adding Semglee 25 units daily and Novolog sensitive q 4 hours while NPO.   Thanks,   Beryl Meager, RN, BC-ADM Inpatient Diabetes Coordinator Pager [phone]  (8a-5p)
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
## 2023-02-02 NOTE — Assessment & Plan Note (Addendum)
This meets criteria for morbid obesity based on the presence of 1 or more chronic comorbidities. Patient has BMI of 37.86, hypertension, hyperlipidemia. This complicates overall care and prognosis.

## 2023-02-02 NOTE — Assessment & Plan Note (Signed)
Uncontrolled diabetes mellitus with hyperglycemia and A1c of 10.6 CBG elevated. Goal inpatient blood glucose levels 140-180 -Add 20 units of Semglee -Continue with SSI

## 2023-02-02 NOTE — Assessment & Plan Note (Signed)
Home atorvastatin 80 mg nightly and ezetimibe 10 mg daily were resumed on admission

## 2023-02-02 NOTE — Assessment & Plan Note (Addendum)
Home trazodone 100 mg nightly as needed for sleep resumed

## 2023-02-02 NOTE — Assessment & Plan Note (Signed)
Duloxetine 60 mg nightly resumed Trazodone 100 mg nightly as needed for sleep resumed

## 2023-02-02 NOTE — Assessment & Plan Note (Addendum)
Patient received a CT abdomen pelvis with contrast per EDP which was read as negative for bowel obstruction or inflammation.  Normal gallbladder and CBD.  Haziness at pancreatic head suggesting mild acute pancreatitis. Continue to have significant pain although tolerating clear liquid well and willing to advance diet. -Diet advanced to full liquid-we will advance as tolerated -Continue with pain management and supportive care -If continue to have pain we might have to do CTA to rule out any ischemia.
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
## 2023-02-02 NOTE — Assessment & Plan Note (Signed)
Resumed home oxycodone 10 mg p.o. twice daily There is also as needed pain medication for acute pain in setting of acute pancreatitis
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
## 2023-02-02 NOTE — Assessment & Plan Note (Signed)
-   Home PPI resumed 

## 2023-02-02 NOTE — Assessment & Plan Note (Signed)
Aspirin 81 mg daily, atorvastatin 80 mg nightly, benazepril 10 mg daily, Imdur 60 mg daily were resumed on admission

## 2023-02-03 LAB — BASIC METABOLIC PANEL
Anion gap: 7 (ref 5–15)
BUN: 12 mg/dL (ref 8–23)
CO2: 25 mmol/L (ref 22–32)
Calcium: 8.5 mg/dL — ABNORMAL LOW (ref 8.9–10.3)
Chloride: 103 mmol/L (ref 98–111)
Creatinine, Ser: 0.97 mg/dL (ref 0.61–1.24)
GFR, Estimated: >60 mL/min (ref >60)
Glucose, Bld: 245 mg/dL — ABNORMAL HIGH (ref 70–99)
Potassium: 4.2 mmol/L (ref 3.5–5.1)
Sodium: 135 mmol/L (ref 135–145)

## 2023-02-03 LAB — CBC
HCT: 32.3 % — ABNORMAL LOW (ref 39.0–52.0)
Hemoglobin: 10.6 g/dL — ABNORMAL LOW (ref 13.0–17.0)
MCH: 28.5 pg (ref 26.0–34.0)
MCHC: 32.8 g/dL (ref 30.0–36.0)
MCV: 86.8 fL (ref 80.0–100.0)
Platelets: 156 10*3/uL (ref 150–400)
RBC: 3.72 MIL/uL — ABNORMAL LOW (ref 4.22–5.81)
RDW: 14.5 % (ref 11.5–15.5)
WBC: 10.1 10*3/uL (ref 4.0–10.5)
nRBC: 0 % (ref 0.0–0.2)

## 2023-02-03 LAB — GLUCOSE, CAPILLARY
Glucose-Capillary: 211 mg/dL — ABNORMAL HIGH (ref 70–99)
Glucose-Capillary: 246 mg/dL — ABNORMAL HIGH (ref 70–99)
Glucose-Capillary: 191 mg/dL — ABNORMAL HIGH (ref 70–99)
Glucose-Capillary: 229 mg/dL — ABNORMAL HIGH (ref 70–99)

## 2023-02-03 MED ORDER — OXYCODONE HCL 5 MG PO TABS
15.0000 mg | ORAL_TABLET | Freq: Four times a day (QID) | ORAL | Status: DC | PRN
  Administered 2023-02-03: 15 mg via ORAL
  Filled 2023-02-03: qty 3

## 2023-02-03 MED ORDER — INSULIN GLARGINE-YFGN 100 UNIT/ML ~~LOC~~ SOLN
20.0000 [IU] | Freq: Every day | SUBCUTANEOUS | Status: DC
  Administered 2023-02-03 – 2023-02-04 (×2): 20 [IU] via SUBCUTANEOUS
  Filled 2023-02-03 (×2): qty 0.2

## 2023-02-03 MED ORDER — INSULIN ASPART 100 UNIT/ML IJ SOLN
0.0000 [IU] | Freq: Three times a day (TID) | INTRAMUSCULAR | Status: DC
  Administered 2023-02-03: 3 [IU] via SUBCUTANEOUS
  Administered 2023-02-04 (×2): 5 [IU] via SUBCUTANEOUS
  Filled 2023-02-03 (×3): qty 1

## 2023-02-03 NOTE — Progress Notes (Signed)
Inpatient Diabetes Program Recommendations  AACE/ADA: New Consensus Statement on Inpatient Glycemic Control (2015)  Target Ranges:  Prepandial:   less than 140 mg/dL      Peak postprandial:   less than 180 mg/dL (1-2 hours)      Critically ill patients:  140 - 180 mg/dL   Lab Results  Component Value Date   GLUCAP 211 (H) 02/03/2023   HGBA1C 10.6 (H) 02/02/2023    Review of Glycemic Control  Latest Reference Range & Units 02/02/23 17:55 02/02/23 21:03 02/03/23 07:47  Glucose-Capillary 70 - 99 mg/dL 161 (H) 096 (H) 045 (H)  Diabetes history: DM 2 Outpatient Diabetes medications:  Basaglar 40 units QHS  Humalog 22/16/16 Farxiga 10 mg daily  Correction factor: Humalog (BG -130/25) Patient will start insulin pump/omnipod next week Current orders for Inpatient glycemic control:  Novolog resistant tid with meals and HS Farxiga 10 mg daily Inpatient Diabetes Program Recommendations:     Consider adding Semglee 20 units daily and reduce Novolog to moderate tid with meals and HS.   Thanks,  Beryl Meager, RN, BC-ADM Inpatient Diabetes Coordinator Pager [phone]  (8a-5p)

## 2023-02-03 NOTE — Progress Notes (Signed)
Mobility Specialist - Progress Note   02/03/23 1555  Mobility  Activity  (supine bed exercises)  Level of Assistance Independent  Assistive Device None  Distance Ambulated (ft) 0 ft  Range of Motion/Exercises Active;Right leg;Left leg  Activity Response Tolerated well  $Mobility charge 1 Mobility   Pt supine upon entry, utilizing RA. Pt denied OOB amb stating "my stomach hurts", however acceptable to bed level exercise. Pt  denied sitting up or transferring to EOB-- all exercises were perform while supine. Pt completed 2 sets of 6 Bilat leg raises, ankle pumps and circles. Pt left supine with alarm set and needs within reach.   Zetta Bills Mobility Specialist 02/03/23 4:01 PM

## 2023-02-03 NOTE — TOC Initial Note (Signed)
Transition of Care (TOC) - Initial/Assessment Note    Patient Details  Name: Edward Schneider. MRN: [id number] Date of Birth: 1956/01/08  Transition of Care [facility] CM/SW Contact:    Chapman Fitch, RN Phone Number: 02/03/2023, 1:52 PM  Clinical Narrative:                     Transition of Care [facility]) Screening Note   Patient Details  Name: Edward Schneider. Date of Birth: 1956-07-18   Transition of Care [facility]) CM/SW Contact:    Chapman Fitch, RN Phone Number: 02/03/2023, 1:52 PM    Transition of Care Department [facility]) has reviewed patient and no TOC needs have been identified at this time. We will continue to monitor patient advancement through interdisciplinary progression rounds. If new patient transition needs arise, please place a TOC consult.       Patient Goals and CMS Choice            Expected Discharge Plan and Services                                              Prior Living Arrangements/Services                       Activities of Daily Living      Permission Sought/Granted                  Emotional Assessment              Admission diagnosis:  Acute pancreatitis [K85.90] Acute pancreatitis, unspecified complication status, unspecified pancreatitis type [K85.90] Patient Active Problem List   Diagnosis Date Noted   Acute pancreatitis 02/02/2023   Leukocytosis 02/02/2023   Lung nodule 10/20/2022   Olecranon bursitis of left elbow 07/28/2022   Cellulitis 07/02/2022   H/O acute pancreatitis 03/18/2022   Liver disease 03/11/2022   S/P TKR (total knee replacement) using cement, right 08/22/2021   Status post total hip replacement, right 06/03/2021   Senile purpura 12/06/2020   Family history of malignant neoplasm of gastrointestinal tract    Depression, major, single episode, moderate 03/06/2020   Uncontrolled type 2 diabetes mellitus with hyperglycemia 10/29/2019   Coronary artery disease of native  artery of native heart with stable angina pectoris 08/31/2019   Morbid obesity 05/17/2019   BPH (benign prostatic hyperplasia) 04/27/2018   Sleep apnea 02/08/2018   GERD (gastroesophageal reflux disease) 04/04/2017   Advanced care planning/counseling discussion 03/29/2017   DM type 2 with diabetic peripheral neuropathy 09/30/2016   Insomnia 12/24/2015   Hyperlipidemia associated with type 2 diabetes mellitus 09/23/2015   Bilateral carotid artery stenosis 09/11/2015   Atherosclerosis of abdominal aorta 08/26/2015   Migraine headache 08/21/2015   Emphysema lung 04/25/2015   Hypertension associated with diabetes 04/25/2015   DDD (degenerative disc disease), cervical 03/27/2015   DDD (degenerative disc disease), lumbar 03/27/2015   Cervical post-laminectomy syndrome 03/27/2015   Pancreatic insufficiency 01/28/2015   Chronic back pain 12/24/2014   PCP:  Marjie Skiff, NP Pharmacy:   [facility] [phone] Dan Humphreys,  - [address] AT [facility] OF [address] [facility] [place] [address] Phone: [phone] Fax: [phone]  [facility] [place], [place] - [address]  Penelope Galas [place] [address] Phone: [phone] Fax: [phone]  [facility] - [place], [place] - [address] [place] [address] Phone: [phone] Fax: [phone]  [facility] - [place], [place] - [place] [address] [place] [address] Phone: [phone] Fax: [phone]     Social Determinants of Health (SDOH) Social History: SDOH Screenings   Food Insecurity: No Food Insecurity (07/08/2022)  Housing: Low Risk  (07/08/2022)  Transportation Needs: No Transportation Needs (07/08/2022)  Utilities: Not At Risk (07/08/2022)  Alcohol Screen: Low Risk  (07/08/2022)  Depression (PHQ2-9): Low Risk  (07/08/2022)  Recent Concern:  Depression (PHQ2-9) - Medium Risk (04/17/2022)  Financial Resource Strain: Low Risk  (07/08/2022)  Physical Activity: Inactive (07/08/2022)  Social Connections: Moderately Isolated (07/08/2022)  Stress: No Stress Concern Present (07/08/2022)  Tobacco Use: Medium Risk (02/02/2023)   SDOH Interventions:     Readmission Risk Interventions     No data to display
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
## 2023-02-04 DIAGNOSIS — E1142 Type 2 diabetes mellitus with diabetic polyneuropathy: Secondary | ICD-10-CM

## 2023-02-04 DIAGNOSIS — M5441 Lumbago with sciatica, right side: Secondary | ICD-10-CM

## 2023-02-04 DIAGNOSIS — E785 Hyperlipidemia, unspecified: Secondary | ICD-10-CM

## 2023-02-04 DIAGNOSIS — M5442 Lumbago with sciatica, left side: Secondary | ICD-10-CM

## 2023-02-04 DIAGNOSIS — D72829 Elevated white blood cell count, unspecified: Secondary | ICD-10-CM

## 2023-02-04 DIAGNOSIS — G8929 Other chronic pain: Secondary | ICD-10-CM

## 2023-02-04 DIAGNOSIS — E1169 Type 2 diabetes mellitus with other specified complication: Secondary | ICD-10-CM

## 2023-02-04 LAB — CBC
HCT: 38.3 % — ABNORMAL LOW (ref 39.0–52.0)
Hemoglobin: 12.2 g/dL — ABNORMAL LOW (ref 13.0–17.0)
MCH: 27.7 pg (ref 26.0–34.0)
MCHC: 31.9 g/dL (ref 30.0–36.0)
MCV: 86.8 fL (ref 80.0–100.0)
Platelets: 176 10*3/uL (ref 150–400)
RBC: 4.41 MIL/uL (ref 4.22–5.81)
RDW: 14.2 % (ref 11.5–15.5)
WBC: 8.6 10*3/uL (ref 4.0–10.5)
nRBC: 0 % (ref 0.0–0.2)

## 2023-02-04 LAB — GLUCOSE, CAPILLARY
Glucose-Capillary: 226 mg/dL — ABNORMAL HIGH (ref 70–99)
Glucose-Capillary: 239 mg/dL — ABNORMAL HIGH (ref 70–99)

## 2023-02-04 MED ORDER — INSULIN ASPART 100 UNIT/ML IJ SOLN
4.0000 [IU] | Freq: Three times a day (TID) | INTRAMUSCULAR | Status: DC
  Filled 2023-02-04: qty 1
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
## 2023-02-05 ENCOUNTER — Telehealth: Payer: Self-pay

## 2023-02-05 ENCOUNTER — Ambulatory Visit: Payer: Self-pay

## 2023-02-05 NOTE — Transitions of Care (Post Inpatient/ED Visit) (Unsigned)
   02/05/2023  Name: Edward Schneider. MRN: [id number] DOB: 04/27/1956  Today's TOC FU Call Status: Today's TOC FU Call Status:: Unsuccessul Call (1st Attempt) Unsuccessful Call (1st Attempt) Date: 02/05/23  Attempted to reach the patient regarding the most recent Inpatient/ED visit.  Follow Up Plan: Additional outreach attempts will be made to reach the patient to complete the Transitions of Care (Post Inpatient/ED visit) call.   Signature Karena Addison, LPN [facility] Nurse Health Advisor Direct Dial [phone]

## 2023-02-05 NOTE — Chronic Care Management (AMB) (Signed)
   02/05/2023  Markus Jarvis Jr. 03-30-1956 [id number]   Reason for Encounter: Patient is not currently enrolled in the CCM program. CCM status changed to previously enrolled   .pt

## 2023-02-08 ENCOUNTER — Inpatient Hospital Stay: Payer: Medicare Other | Admitting: Nurse Practitioner

## 2023-02-08 ENCOUNTER — Telehealth: Payer: Self-pay | Admitting: *Deleted

## 2023-02-08 NOTE — Patient Outreach (Signed)
  Care Coordination   Initial Visit Note   02/09/2023 Name: Edward Schneider. MRN: [id number] DOB: 1956/01/30  Edward Schneider. is a 67 y.o. year old male who sees [place], Corrie Dandy T, NP for primary care. I spoke with  Edward Schneider. by phone today.  What matters to the patients health and wellness today?  Hospitalized 4/16-18 for pancreatitis. State blood sugars are "terrible."  Report today was 527, now decreased to 234.  Waiting for insulin pump to be delivered.    Goals Addressed             This Visit's Progress    Effective maagement of DM       Care Coordination Interventions: Provided education to patient about basic DM disease process Reviewed medications with patient and discussed importance of medication adherence Counseled on importance of regular laboratory monitoring as prescribed Discussed plans with patient for ongoing care management follow up and provided patient with direct contact information for care management team Provided patient with written educational materials related to hypo and hyperglycemia and importance of correct treatment Reviewed scheduled/upcoming provider appointments including: endocrine on 8/13 Advised patient, providing education and rationale, to check cbg 3-4 times a day and record, calling provider for findings outside established parameters Screening for signs and symptoms of depression related to chronic disease state  Assessed social determinant of health barriers         SDOH assessments and interventions completed:  Yes  SDOH Interventions Today    Flowsheet Row Most Recent Value  SDOH Interventions   Food Insecurity Interventions Intervention Not Indicated  Housing Interventions Intervention Not Indicated  Transportation Interventions Intervention Not Indicated        Care Coordination Interventions:  Yes, provided   Interventions Today    Flowsheet Row Most Recent Value  Chronic Disease   Chronic disease during  today's visit Diabetes  General Interventions   General Interventions Discussed/Reviewed General Interventions Reviewed, Annual Eye Exam, Labs, Annual Foot Exam, [facility], Communication with, Doctor Visits  Labs Hgb A1c every 3 months  [A1C this month was 10.4]  Doctor Visits Discussed/Reviewed Doctor Visits Reviewed, PCP, Specialist  [next endocrine visit 8/13]  PCP/Specialist Visits Compliance with follow-up visit  Communication with Pharmacists  [Referral sent to pharmacy team]  Exercise Interventions   Exercise Discussed/Reviewed Physical Activity  Physical Activity Discussed/Reviewed Physical Activity Reviewed  Education Interventions   Education Provided Provided Education, Provided Printed Education  Provided Verbal Education On Nutrition, Labs, Blood Sugar Monitoring, Medication, When to see the doctor  Labs Reviewed Hgb A1c  Nutrition Interventions   Nutrition Discussed/Reviewed Nutrition Discussed, Increaing proteins, Carbohydrate meal planning, Decreasing sugar intake, Adding fruits and vegetables  Pharmacy Interventions   Pharmacy Dicussed/Reviewed Pharmacy Topics Discussed, Affording Medications, Referral to Pharmacist  Referral to Pharmacist Cannot afford medications       Follow up plan: Referral made to [facility] Follow up call scheduled for 5/7    Encounter Outcome:  Pt. Visit Completed   Kemper Durie, RN, MSN, [facility] Care Management Care Management Coordinator [phone]

## 2023-02-08 NOTE — Progress Notes (Deleted)
There were no vitals taken for this visit.   Subjective:    Patient ID: Edward Mangle., male    DOB: 11-27-1955, 67 y.o.   MRN: [id number]  HPI: Edward Fitzhenry. is a 67 y.o. male  No chief complaint on file.  Transition of Care Hospital Follow up.   Hospital/Facility: [facility] D/C Physician: Dr. Nelson Chimes D/C Date: 02/04/23  Records Requested:  Records Received:  Records Reviewed:   Diagnoses on Discharge:   * Acute pancreatitis Patient received a CT abdomen pelvis with contrast per EDP which was read as negative for bowel obstruction or inflammation.  Normal gallbladder and CBD.  Haziness at pancreatic head suggesting mild acute pancreatitis. Continue to have significant pain although tolerating clear liquid well and willing to advance diet. -Diet advanced to full liquid-we will advance as tolerated -Continue with pain management and supportive care -If continue to have pain we might have to do CTA to rule out any ischemia.   Leukocytosis Resolved. Query reactive in setting of acute pancreatitis No clinical signs for infectious etiology at this time Continue to monitor    Date of interactive Contact within 48 hours of discharge:  Contact was through: {Blank single:19197::"phone","e-mail","direct","other"}  Date of 7 day or 14 day face-to-face visit:    {Blank single:19197::"within 7 days","within 14 days"}  Outpatient Encounter Medications as of 02/08/2023  Medication Sig Note   acetaminophen (TYLENOL) 650 MG CR tablet Take 1,300 mg by mouth every 8 (eight) hours.    albuterol (VENTOLIN HFA) 108 (90 Base) MCG/ACT inhaler INHALE 2 PUFFS INTO THE LUNGS EVERY 6 HOURS AS NEEDED FOR WHEEZING OR SHORTNESS OF BREATH    Alcohol Swabs (PHARMACIST CHOICE ALCOHOL) PADS SMARTSIG:1 Each Topical 4 Times Daily    Ascorbic Acid (VITAMIN C) 1000 MG tablet Take 1,000 mg by mouth daily.     aspirin EC 81 MG tablet Take 81 mg by mouth daily. Swallow whole.    atorvastatin (LIPITOR) 80 MG  tablet Take 1 tablet (80 mg total) by mouth at bedtime.    benazepril (LOTENSIN) 10 MG tablet Take 1 tablet (10 mg total) by mouth daily.    bisacodyl (DULCOLAX) 5 MG EC tablet Take 2 tablets (10 mg total) by mouth daily in the afternoon.    Budeson-Glycopyrrol-Formoterol (BREZTRI AEROSPHERE) 160-9-4.8 MCG/ACT AERO Inhale 2 puffs into the lungs in the morning and at bedtime.    calcium carbonate (OS-CAL) 600 MG TABS tablet Take 600 mg by mouth daily.     cetirizine (ZYRTEC) 10 MG tablet Take 1 tablet (10 mg total) by mouth daily.    Cholecalciferol (VITAMIN D3) 25 MCG (1000 UT) CHEW Chew 1,000 Units by mouth daily.     CINNAMON PO Take 1,000 mg by mouth 2 (two) times daily.     clobetasol cream (TEMOVATE) 0.05 % Apply 1 application topically 2 (two) times daily. 03/05/2022: Applies to "ant bites" on arms and chest   COMFORT EZ PEN NEEDLES 32G X 4 MM MISC Use to inject insulin 5 times daily    Continuous Blood Gluc Sensor (DEXCOM G6 SENSOR) MISC 1 Device by Does not apply route as directed.    Continuous Blood Gluc Transmit (DEXCOM G6 TRANSMITTER) MISC 1 Device by Does not apply route as directed.    cyclobenzaprine (FLEXERIL) 10 MG tablet Take 1 tablet (10 mg total) by mouth 3 (three) times daily as needed for muscle spasms.    dapagliflozin propanediol (FARXIGA) 10 MG TABS tablet Take 1 tablet (10 mg  total) by mouth daily before breakfast.    diclofenac Sodium (VOLTAREN) 1 % GEL Apply 2 g topically 4 (four) times daily as needed (pain).     diphenhydrAMINE (BENADRYL) 25 mg capsule Take 50 mg by mouth in the morning, at noon, and at bedtime. 03/05/2022: Takes 2 tablets TID for as long as he can remember. States he is allergic to everything   doxepin (SINEQUAN) 25 MG capsule Take 2 capsules (50 mg total) by mouth at bedtime.    DULoxetine (CYMBALTA) 60 MG capsule Take 1 capsule (60 mg total) by mouth at bedtime.    EPINEPHrine 0.3 mg/0.3 mL IJ SOAJ injection Inject 0.3 mg into the muscle as needed for  anaphylaxis.    ezetimibe (ZETIA) 10 MG tablet Take 1 tablet (10 mg total) by mouth daily.    famotidine (PEPCID) 20 MG tablet TAKE 1 TABLET BY MOUTH TWICE DAILY    Ferrous Gluconate-C-Folic Acid (IRON-C PO) Take 30 mg by mouth daily.    Ginger, Zingiber officinalis, (GINGER PO) Take 1 tablet by mouth daily.    Ginseng 100 MG CAPS Take 100 mg by mouth daily.     Insulin Disposable Pump (OMNIPOD 5 G6 INTRO, GEN 5,) KIT 1 Device by Does not apply route every other day.    Insulin Disposable Pump (OMNIPOD 5 G6 PODS, GEN 5,) MISC Change pod every other day    insulin lispro (HUMALOG) 100 UNIT/ML injection Max daily 100 units    isosorbide mononitrate (IMDUR) 60 MG 24 hr tablet Take 1 tablet (60 mg total) by mouth daily.    lidocaine (XYLOCAINE) 2 % solution Use as directed 15 mLs in the mouth or throat as needed for mouth pain.    Minoxidil 5 % FOAM Apply 1 g topically daily.    mometasone (ELOCON) 0.1 % ointment Apply topically 2 (two) times daily as needed. 03/05/2022: Applies to "ant bites"   Multiple Vitamins-Minerals (CENTRUM SILVER 50+MEN) TABS Take 1 tablet by mouth daily.    mupirocin ointment (BACTROBAN) 2 % Apply 1 Application topically 2 (two) times daily.    naproxen sodium (ALEVE) 220 MG tablet Take 440 mg by mouth daily as needed (body aches).    NARCAN 4 MG/0.1ML LIQD nasal spray kit Place 0.4 mg into the nose as needed (opioid overdose).     nitroGLYCERIN (NITROSTAT) 0.4 MG SL tablet Place 0.4 mg under the tongue every 5 (five) minutes as needed for chest pain.     Omega-3 1000 MG CAPS Take 1,000 mg by mouth daily.     ondansetron (ZOFRAN) 4 MG tablet TAKE 1 TABLET(4 MG) BY MOUTH EVERY 8 HOURS AS NEEDED FOR NAUSEA OR VOMITING    oxyCODONE (ROXICODONE) 15 MG immediate release tablet Take 15 mg by mouth every 4 (four) hours as needed. :1 Tablet(s) By Mouth Every 4-6 Hours    OXYCONTIN 10 MG 12 hr tablet Take 10 mg by mouth every 12 (twelve) hours.    pantoprazole (PROTONIX) 40 MG  tablet TAKE 1 TABLET(40 MG) BY MOUTH TWICE DAILY    Plecanatide (TRULANCE) 3 MG TABS Take 1 tablet by mouth daily.    polyethylene glycol (MIRALAX / GLYCOLAX) 17 g packet Take 17 g by mouth 2 (two) times daily.    pregabalin (LYRICA) 75 MG capsule Take 75 mg by mouth 2 (two) times daily.    senna-docusate (SENOKOT-S) 8.6-50 MG tablet Take 1 tablet by mouth 2 (two) times daily.    sucralfate (CARAFATE) 1 g tablet TAKE 1 TABLET  BY MOUTH FOUR TIMES DAILY AT BEDTIME WITH MEALS    traZODone (DESYREL) 100 MG tablet Take 1 tablet (100 mg total) by mouth at bedtime as needed. for sleep    vitamin A 16109 UNIT capsule Take 10,000 Units by mouth daily.    vitamin B-12 (CYANOCOBALAMIN) 1000 MCG tablet Take 1,000 mcg by mouth daily.    Vitamin E 400 units TABS Take 400 Units by mouth daily.     No facility-administered encounter medications on file as of 02/08/2023.    Diagnostic Tests Reviewed/Disposition:   Consults:  Discharge Instructions  Disease/illness Education:  Home Health/Community Services Discussions/Referrals:  Establishment or re-establishment of referral orders for community resources:  Discussion with other health care providers:  Assessment and Support of treatment regimen adherence:  Appointments Coordinated with:   Education for self-management, independent living, and ADLs:   Relevant past medical, surgical, family and social history reviewed and updated as indicated. Interim medical history since our last visit reviewed. Allergies and medications reviewed and updated.  Review of Systems  Per HPI unless specifically indicated above     Objective:    There were no vitals taken for this visit.  Wt Readings from Last 3 Encounters:  02/02/23 249 lb (112.9 kg)  12/11/22 251 lb (113.9 kg)  08/04/22 247 lb (112 kg)    Physical Exam  Results for orders placed or performed during the hospital encounter of 02/02/23  Lipase, blood  Result Value Ref Range   Lipase  41 11 - 51 U/L  Comprehensive metabolic panel  Result Value Ref Range   Sodium 133 (L) 135 - 145 mmol/L   Potassium 4.3 3.5 - 5.1 mmol/L   Chloride 97 (L) 98 - 111 mmol/L   CO2 24 22 - 32 mmol/L   Glucose, Bld 395 (H) 70 - 99 mg/dL   BUN 17 8 - 23 mg/dL   Creatinine, Ser 6.04 0.61 - 1.24 mg/dL   Calcium 9.2 8.9 - 54.0 mg/dL   Total Protein 7.5 6.5 - 8.1 g/dL   Albumin 3.8 3.5 - 5.0 g/dL   AST 24 15 - 41 U/L   ALT 21 0 - 44 U/L   Alkaline Phosphatase 55 38 - 126 U/L   Total Bilirubin 1.0 0.3 - 1.2 mg/dL   GFR, Estimated >98 >11 mL/min   Anion gap 12 5 - 15  CBC  Result Value Ref Range   WBC 13.8 (H) 4.0 - 10.5 K/uL   RBC 4.43 4.22 - 5.81 MIL/uL   Hemoglobin 12.2 (L) 13.0 - 17.0 g/dL   HCT 91.4 (L) 78.2 - 95.6 %   MCV 86.9 80.0 - 100.0 fL   MCH 27.5 26.0 - 34.0 pg   MCHC 31.7 30.0 - 36.0 g/dL   RDW 21.3 08.6 - 57.8 %   Platelets 175 150 - 400 K/uL   nRBC 0.0 0.0 - 0.2 %  Urinalysis, Routine w reflex microscopic -Urine, Clean Catch  Result Value Ref Range   Color, Urine STRAW (A) YELLOW   APPearance CLEAR (A) CLEAR   Specific Gravity, Urine >1.046 (H) 1.005 - 1.030   pH 5.0 5.0 - 8.0   Glucose, UA >=500 (A) NEGATIVE mg/dL   Hgb urine dipstick NEGATIVE NEGATIVE   Bilirubin Urine NEGATIVE NEGATIVE   Ketones, ur NEGATIVE NEGATIVE mg/dL   Protein, ur NEGATIVE NEGATIVE mg/dL   Nitrite NEGATIVE NEGATIVE   Leukocytes,Ua NEGATIVE NEGATIVE   RBC / HPF 0-5 0 - 5 RBC/hpf   WBC, UA 0-5 0 -  5 WBC/hpf   Bacteria, UA NONE SEEN NONE SEEN   Squamous Epithelial / HPF NONE SEEN 0 - 5 /HPF  Hemoglobin A1c  Result Value Ref Range   Hgb A1c MFr Bld 10.6 (H) 4.8 - 5.6 %   Mean Plasma Glucose 257.52 mg/dL  Basic metabolic panel  Result Value Ref Range   Sodium 135 135 - 145 mmol/L   Potassium 4.2 3.5 - 5.1 mmol/L   Chloride 103 98 - 111 mmol/L   CO2 25 22 - 32 mmol/L   Glucose, Bld 245 (H) 70 - 99 mg/dL   BUN 12 8 - 23 mg/dL   Creatinine, Ser 1.61 0.61 - 1.24 mg/dL   Calcium 8.5 (L)  8.9 - 10.3 mg/dL   GFR, Estimated >09 >60 mL/min   Anion gap 7 5 - 15  CBC  Result Value Ref Range   WBC 10.1 4.0 - 10.5 K/uL   RBC 3.72 (L) 4.22 - 5.81 MIL/uL   Hemoglobin 10.6 (L) 13.0 - 17.0 g/dL   HCT 45.4 (L) 09.8 - 11.9 %   MCV 86.8 80.0 - 100.0 fL   MCH 28.5 26.0 - 34.0 pg   MCHC 32.8 30.0 - 36.0 g/dL   RDW 14.7 82.9 - 56.2 %   Platelets 156 150 - 400 K/uL   nRBC 0.0 0.0 - 0.2 %  Glucose, capillary  Result Value Ref Range   Glucose-Capillary 226 (H) 70 - 99 mg/dL  Glucose, capillary  Result Value Ref Range   Glucose-Capillary 306 (H) 70 - 99 mg/dL   Comment 1 Notify RN   Glucose, capillary  Result Value Ref Range   Glucose-Capillary 211 (H) 70 - 99 mg/dL  Glucose, capillary  Result Value Ref Range   Glucose-Capillary 246 (H) 70 - 99 mg/dL  CBC  Result Value Ref Range   WBC 8.6 4.0 - 10.5 K/uL   RBC 4.41 4.22 - 5.81 MIL/uL   Hemoglobin 12.2 (L) 13.0 - 17.0 g/dL   HCT 13.0 (L) 86.5 - 78.4 %   MCV 86.8 80.0 - 100.0 fL   MCH 27.7 26.0 - 34.0 pg   MCHC 31.9 30.0 - 36.0 g/dL   RDW 69.6 29.5 - 28.4 %   Platelets 176 150 - 400 K/uL   nRBC 0.0 0.0 - 0.2 %  Glucose, capillary  Result Value Ref Range   Glucose-Capillary 191 (H) 70 - 99 mg/dL  Glucose, capillary  Result Value Ref Range   Glucose-Capillary 229 (H) 70 - 99 mg/dL  Glucose, capillary  Result Value Ref Range   Glucose-Capillary 226 (H) 70 - 99 mg/dL  Glucose, capillary  Result Value Ref Range   Glucose-Capillary 239 (H) 70 - 99 mg/dL  Troponin I (High Sensitivity)  Result Value Ref Range   Troponin I (High Sensitivity) 5 <18 ng/L  Troponin I (High Sensitivity)  Result Value Ref Range   Troponin I (High Sensitivity) 4 <18 ng/L   *Note: Due to a large number of results and/or encounters for the requested time period, some results have not been displayed. A complete set of results can be found in Results Review.      Assessment & Plan:   Problem List Items Addressed This Visit   None    Follow  up plan: No follow-ups on file.

## 2023-02-09 NOTE — Patient Instructions (Signed)
Visit Information  Thank you for taking time to visit with me today. Please don't hesitate to contact me if I can be of assistance to you before our next scheduled telephone appointment.  Following are the goals we discussed today:  Listen for call from [facility] pharmacy team. Continue monitoring blood sugars and adhere to diabetic diet. Call endocrine office once insulin pump has been received for further instructions.   Our next appointment is by telephone on 5/7  Please call the care guide team at [phone] if you need to cancel or reschedule your appointment.   Please call the Suicide and Crisis Lifeline: 988 call the [place] National Suicide Prevention Lifeline: 442-318-2549 or TTY: 780-019-1661 TTY (912) 785-1659) to talk to a trained counselor call 1-800-273-TALK (toll free, 24 hour hotline) call 911 if you are experiencing a Mental Health or Behavioral Health Crisis or need someone to talk to.  The patient verbalized understanding of instructions, educational materials, and care plan provided today and agreed to receive a mailed copy of patient instructions, educational materials, and care plan.   The patient has been provided with contact information for the care management team and has been advised to call with any health related questions or concerns.   Kemper Durie, RN, MSN, [facility] Care Management Care Management Coordinator [phone]

## 2023-02-10 NOTE — Transitions of Care (Post Inpatient/ED Visit) (Unsigned)
   02/10/2023  Name: Edward Schneider. MRN: [id number] DOB: 1955-11-23  Today's TOC FU Call Status: Today's TOC FU Call Status:: Unsuccessful Call (2nd Attempt) Unsuccessful Call (1st Attempt) Date: 02/05/23 Unsuccessful Call (2nd Attempt) Date: 02/10/23  Attempted to reach the patient regarding the most recent Inpatient/ED visit.  Follow Up Plan: Additional outreach attempts will be made to reach the patient to complete the Transitions of Care (Post Inpatient/ED visit) call.   Signature Karena Addison, LPN [facility] Nurse Health Advisor Direct Dial [phone]

## 2023-02-11 NOTE — Transitions of Care (Post Inpatient/ED Visit) (Signed)
   02/11/2023  Name: Edward Schneider. MRN: [id number] DOB: 17-Sep-1956  Today's TOC FU Call Status: Today's TOC FU Call Status:: Unsuccessful Call (3rd Attempt) Unsuccessful Call (1st Attempt) Date: 02/05/23 Unsuccessful Call (2nd Attempt) Date: 02/10/23 Unsuccessful Call (3rd Attempt) Date: 02/11/23  Attempted to reach the patient regarding the most recent Inpatient/ED visit.  Follow Up Plan: No further outreach attempts will be made at this time. We have been unable to contact the patient.  Signature Karena Addison, LPN [facility] Nurse Health Advisor Direct Dial [phone]

## 2023-02-16 ENCOUNTER — Other Ambulatory Visit: Payer: Self-pay

## 2023-02-16 MED ORDER — DEXCOM G6 TRANSMITTER MISC: 1.0000 | 3 refills | Status: DC

## 2023-02-16 MED ORDER — DEXCOM G6 SENSOR MISC: 1.0000 | 3 refills | Status: DC

## 2023-02-23 ENCOUNTER — Ambulatory Visit: Payer: Self-pay | Admitting: *Deleted

## 2023-02-23 NOTE — Patient Outreach (Signed)
  Care Coordination   02/23/2023 Name: Edward Schneider. MRN: [id number] DOB: 06-19-1956   Care Coordination Outreach Attempts:  An unsuccessful telephone outreach was attempted for a scheduled appointment today.  Follow Up Plan:  Additional outreach attempts will be made to offer the patient care coordination information and services.   Encounter Outcome:  No Answer   Care Coordination Interventions:  No, not indicated    Kemper Durie, RN, MSN, [facility] Care Management Care Management Coordinator [phone]

## 2023-03-02 ENCOUNTER — Telehealth: Payer: Self-pay | Admitting: Nutrition

## 2023-03-02 NOTE — Telephone Encounter (Signed)
LVM to call me to schedule pump training and CGM training

## 2023-03-08 ENCOUNTER — Ambulatory Visit: Payer: Self-pay | Admitting: *Deleted

## 2023-03-08 NOTE — Patient Outreach (Signed)
  Care Coordination   Follow Up Visit Note   03/08/2023 Name: Edward Schneider. MRN: [id number] DOB: 06-19-1956  Edward Mangle. is a 67 y.o. year old male who sees [place], Corrie Dandy T, NP for primary care. I spoke with  Edward Mangle. by phone today.  What matters to the patients health and wellness today?  Have insulin pump placed.    Goals Addressed             This Visit's Progress    Effective maagement of DM   On track    Care Coordination Interventions: Provided education to patient about basic DM disease process Reviewed medications with patient and discussed importance of medication adherence Counseled on importance of regular laboratory monitoring as prescribed Discussed plans with patient for ongoing care management follow up and provided patient with direct contact information for care management team Provided patient with written educational materials related to hypo and hyperglycemia and importance of correct treatment Reviewed scheduled/upcoming provider appointments including: endocrine on 8/13 Advised patient, providing education and rationale, to check cbg 3-4 times a day and record, calling provider for findings outside established parameters         SDOH assessments and interventions completed:  No     Care Coordination Interventions:  Yes, provided   Interventions Today    Flowsheet Row Most Recent Value  Chronic Disease   Chronic disease during today's visit Diabetes  General Interventions   General Interventions Discussed/Reviewed General Interventions Reviewed, Doctor Visits, Durable Medical Equipment (DME), Communication with  Doctor Visits Discussed/Reviewed Doctor Visits Reviewed, Specialist  Durable Medical Equipment (DME) Insulin Pump  [State he has all equipment, state he will see nurse this week for further instructions, however, appointment not on calendar]  PCP/Specialist Visits Compliance with follow-up visit  Communication with RN   [facility] sent to nurse at endocrinology clinic to clarify appointment for insulin pump set up]  Education Interventions   Education Provided Provided Education  Provided Verbal Education On Blood Sugar Monitoring, Labs  [Aware of need to monitor blood sugars in effort to decrease A1C]  Labs Reviewed Hgb A1c       Follow up plan: Follow up call scheduled for 6/6    Encounter Outcome:  Pt. Visit Completed   Kemper Durie, RN, MSN, [facility] Care Management Care Management Coordinator [phone]

## 2023-03-10 ENCOUNTER — Ambulatory Visit: Payer: Medicare Other | Admitting: Dermatology

## 2023-03-16 ENCOUNTER — Ambulatory Visit: Payer: Medicare Other | Admitting: Dietician

## 2023-03-21 ENCOUNTER — Other Ambulatory Visit: Payer: Self-pay | Admitting: Nurse Practitioner

## 2023-03-22 NOTE — Telephone Encounter (Signed)
Requested medication (s) are due for refill today: expired medication   Requested medication (s) are on the active medication list: yes   Last refill:  03/04/22 #180 4 refills  Future visit scheduled: no   Notes to clinic:   expired medication . Do you want to renew Rx?     Requested Prescriptions  Pending Prescriptions Disp Refills   famotidine (PEPCID) 20 MG tablet [Pharmacy Med Name: FAMOTIDINE 20MG  TABLETS] 180 tablet 4    Sig: TAKE 1 TABLET BY MOUTH TWICE DAILY     Gastroenterology:  H2 Antagonists Passed - 03/21/2023  3:10 PM      Passed - Valid encounter within last 12 months    Recent Outpatient Visits           7 months ago Olecranon bursitis of left elbow   [facility] Mecum, Erin E, PA-C   8 months ago Uncontrolled type 2 diabetes mellitus with hyperglycemia (HCC)   [place] [facility] [place], Milnor T, NP   8 months ago Senile purpura (HCC)   [facility] [place], Corrie Dandy T, NP   8 months ago Cellulitis of left upper extremity   [facility] Larae Grooms, NP   11 months ago Olecranon bursitis of left elbow   [place] [facility] Gabriel Cirri, NP

## 2023-03-23 ENCOUNTER — Other Ambulatory Visit: Payer: Self-pay | Admitting: Nurse Practitioner

## 2023-03-23 ENCOUNTER — Ambulatory Visit: Payer: Self-pay | Admitting: *Deleted

## 2023-03-23 ENCOUNTER — Encounter: Payer: Medicare Other | Attending: Nurse Practitioner | Admitting: Nutrition

## 2023-03-23 DIAGNOSIS — E1165 Type 2 diabetes mellitus with hyperglycemia: Secondary | ICD-10-CM | POA: Insufficient documentation

## 2023-03-23 NOTE — Telephone Encounter (Signed)
Per notes on RF today- patient needs appointment- declining 90 day supply Requested Prescriptions  Pending Prescriptions Disp Refills   famotidine (PEPCID) 20 MG tablet [Pharmacy Med Name: FAMOTIDINE 20MG  TABLETS] 180 tablet     Sig: TAKE 1 TABLET BY MOUTH TWICE DAILY     Gastroenterology:  H2 Antagonists Passed - 03/23/2023  8:15 AM      Passed - Valid encounter within last 12 months    Recent Outpatient Visits           7 months ago Olecranon bursitis of left elbow   [facility] Mecum, Erin E, PA-C   8 months ago Uncontrolled type 2 diabetes mellitus with hyperglycemia (HCC)   [place] [facility] [place], [place] T, NP   8 months ago Senile purpura (HCC)   [place] [facility] [place], Corrie Dandy T, NP   8 months ago Cellulitis of left upper extremity   [place] [facility] Larae Grooms, NP   11 months ago Olecranon bursitis of left elbow    [facility] Gabriel Cirri, NP

## 2023-03-23 NOTE — Progress Notes (Signed)
Patient was trained on the use of the Dexcom G6 sensor.  His phone was set up and clarity app was linked to [facility]..  OmniPod account was also set up.  User nameFrancesca Vos   PW: [email or web address]@[email or web address]  He will return tomorrow to be trained on how to use his insulin pump

## 2023-03-23 NOTE — Patient Outreach (Signed)
  Care Coordination   03/23/2023 Name: Edward Schneider. MRN: [id number] DOB: 12/20/1955   Care Coordination Outreach Attempts:  An unsuccessful telephone outreach was attempted for a scheduled appointment today.  Follow Up Plan:  Additional outreach attempts will be made to offer the patient care coordination information and services.   Encounter Outcome:  No Answer   Care Coordination Interventions:  No, not indicated    Kemper Durie, RN, MSN, [facility] Care Management Care Management Coordinator [phone]

## 2023-03-23 NOTE — Patient Instructions (Signed)
Change sensor every 10 days. °Change transmitter every 3 months. °Call Dexcom help line if questions. °

## 2023-03-23 NOTE — Telephone Encounter (Signed)
Attempted to reach patient to get him scheduled for a follow up for medication management.Marland Kitchen  LVM to call office to get scheduled.  Put in CRM.

## 2023-03-24 ENCOUNTER — Encounter: Payer: Medicare Other | Admitting: Nutrition

## 2023-03-24 NOTE — Progress Notes (Signed)
[place] was trained on the use of the OmniPod 5 insulin pump.  Settings were put in per Dr. Harvel Ricks orders:  Basal rate: 1.6u/hr. I/C: 1,  ISF: 25, target: 120 with correction over 140, Max Basal: 3.2u/hr, Max bolus 30, duration: 4 hours.  We reviewed how this pump delivers insulin and he was advised to stop his long acting insulin injections.  He filled a pod and applied it to his left upper outer arm.  We reviewed how to bolus and when and how to do correction boluses and he redemonstrated this correctly.   He will return Monday for a review and to evaluated blood sugar readings. Lynden Ang [place]                  PW: [id number]@#$

## 2023-03-25 ENCOUNTER — Telehealth: Payer: Self-pay | Admitting: Nutrition

## 2023-03-25 NOTE — Telephone Encounter (Signed)
Tried to call patient at 97 Noon, 1:30PM, 2:30 PM and 4PM.  Patient did not pick.  Last message left was to tell the patient that if he is having difficulty using his pump, to call the help line, and if blood sugars are high or low, to call the office.

## 2023-03-29 ENCOUNTER — Ambulatory Visit: Payer: Medicare Other | Admitting: Nutrition

## 2023-03-29 NOTE — Patient Instructions (Signed)
Give a bolus before all meals and snacks eaten Do a correction dose any time blood sugars go over 225.   Call office if blood sugars drop below 70 or remain over 250. Read over starter booklet and call Omnipod help line if questions. Call Dexcom help line if questions about sensors

## 2023-04-06 ENCOUNTER — Telehealth: Payer: Self-pay | Admitting: Nutrition

## 2023-04-06 ENCOUNTER — Encounter: Payer: Medicare Other | Admitting: Nutrition

## 2023-04-06 DIAGNOSIS — E1165 Type 2 diabetes mellitus with hyperglycemia: Secondary | ICD-10-CM

## 2023-04-06 MED ORDER — ACCU-CHEK SOFTCLIX LANCETS MISC: 12 refills | Status: AC

## 2023-04-06 MED ORDER — ACCU-CHEK GUIDE VI STRP: ORAL_STRIP | 12 refills | Status: AC

## 2023-04-06 NOTE — Progress Notes (Signed)
Patient reports having no questions.  His Dexcom stopped working yesterday and he did not know how to put on a new one,  We reviewed this and a new sensor was started on his left arm and transmitter was inserted.  Reviewed this with him again, and stressed that he needs to use his phone to start the sensor,and his PDM to start the pod.  He started a pod this AM without any difficulty.  He reports not having a meter to use as a back up.  He was given an Accu-check Guide me meter and shown how to use this.  He reports bolusing for every meal and snacking taking 10u and putting in blood sugar readings with every meal entry.   His glooko did not work, because his internet on his PDM was not turned on.  I turned this on, but the data will take more than 6 hours to upload. Dexcom readings stopped 4 days ago, and only 5 days on the pump were recorded.  Patient reports that blood sugars are "much better", and only in the low 200s now, sometimes going into the 180s-190.  He will be seeing dr. Lonzo Cloud in 5 weeks.  We reviewed the pump checklist and he reported good understanding of all topics with no final questions.

## 2023-04-06 NOTE — Telephone Encounter (Signed)
Done

## 2023-04-06 NOTE — Telephone Encounter (Signed)
Please order him the accu-check guide me test strips and lancets.  2/day

## 2023-04-06 NOTE — Patient Instructions (Signed)
Change dexcom sensor using your phone app. Remember to place the gray transmitter into the new sensor. Cal Dexcom if questions Call [employer] of questions.

## 2023-04-07 ENCOUNTER — Telehealth: Payer: Self-pay | Admitting: *Deleted

## 2023-04-07 NOTE — Progress Notes (Signed)
  Care Coordination Note  04/07/2023 Name: Edward Schneider. MRN: [id number] DOB: [birth year]  Edward Mangle. is a 67 y.o. year old male who is a primary care patient of Marjie Skiff, NP and is actively engaged with the care management team. I reached out to Edward Mangle. by phone today to assist with re-scheduling a follow up visit with the RN Case Manager  Follow up plan: Unsuccessful telephone outreach attempt made. A HIPAA compliant phone message was left for the patient providing contact information and requesting a return call.   Burman Nieves, CCMA Care Coordination Care Guide Direct Dial: [phone]

## 2023-04-09 ENCOUNTER — Other Ambulatory Visit: Payer: Self-pay

## 2023-04-09 ENCOUNTER — Telehealth: Payer: Self-pay

## 2023-04-09 MED ORDER — OMNIPOD 5 DEXG7G6 PODS GEN 5 MISC: 3 refills | Status: DC

## 2023-04-09 NOTE — Telephone Encounter (Signed)
Dr. Elvera Lennox signed form for [employer] and it has been faxed for the [facility]

## 2023-04-12 ENCOUNTER — Other Ambulatory Visit: Payer: Self-pay

## 2023-04-12 MED ORDER — OMNIPOD 5 DEXG7G6 PODS GEN 5 MISC: 3 refills | Status: DC

## 2023-04-15 ENCOUNTER — Ambulatory Visit: Admission: RE | Admit: 2023-04-15 | Payer: Medicare Other | Source: Ambulatory Visit

## 2023-04-15 ENCOUNTER — Telehealth: Payer: Self-pay

## 2023-04-16 NOTE — Telephone Encounter (Signed)
Spoke with Mikle Bosworth from [employer] and they will reach out to patient regarding shipment on the pods.  They are aware that patient just needs pods and not intro kit. Patient is aware and will reach back out to them as weill

## 2023-04-28 ENCOUNTER — Telehealth: Payer: Self-pay

## 2023-04-28 NOTE — Telephone Encounter (Signed)
Can you help patient with getting his pods. I called and spoke with [employer] and they had the order so I am not sure what he needs at this point.

## 2023-04-29 NOTE — Progress Notes (Signed)
  Care Coordination Note  04/29/2023 Name: Edward Schneider. MRN: [id number] DOB: November 10, 1955  Edward Schneider. is a 67 y.o. year old male who is a primary care patient of Marjie Skiff, NP and is actively engaged with the care management team. I reached out to Edward Schneider. by phone today to assist with re-scheduling a follow up visit with the RN Case Manager  Follow up plan: We have been unable to make contact with the patient for follow up.   Burman Nieves, CCMA Care Coordination Care Guide Direct Dial: [phone]

## 2023-05-04 NOTE — Telephone Encounter (Signed)
LVM with patient to call me concerning his pump supplies

## 2023-05-05 NOTE — Telephone Encounter (Signed)
Left another message to call me so that I can help with his problem.

## 2023-05-07 ENCOUNTER — Other Ambulatory Visit: Payer: Self-pay | Admitting: Nurse Practitioner

## 2023-05-07 NOTE — Telephone Encounter (Signed)
Patient will need to schedule office visit for further refills. Requested Prescriptions  Pending Prescriptions Disp Refills   atorvastatin (LIPITOR) 80 MG tablet [Pharmacy Med Name: ATORVASTATIN 80MG  TABLETS] 90 tablet 0    Sig: TAKE 1 TABLET(80 MG) BY MOUTH AT BEDTIME     Cardiovascular:  Antilipid - Statins Failed - 05/07/2023  8:11 AM      Failed - Lipid Panel in normal range within the last 12 months    Cholesterol, Total  Date Value Ref Range Status  07/08/2022 119 100 - 199 mg/dL Final   Cholesterol Piccolo, Waived  Date Value Ref Range Status  11/03/2017 152 <200 mg/dL Final    Comment:                            Desirable                <200                         Borderline High      200- 239                         High                     >239    LDL Chol Calc (NIH)  Date Value Ref Range Status  07/08/2022 47 0 - 99 mg/dL Final   HDL  Date Value Ref Range Status  07/08/2022 47 >39 mg/dL Final   Triglycerides  Date Value Ref Range Status  07/08/2022 144 0 - 149 mg/dL Final   Triglycerides Piccolo,Waived  Date Value Ref Range Status  11/03/2017 203 (H) <150 mg/dL Final    Comment:                            Normal                   <150                         Borderline High     150 - 199                         High                200 - 499                         Very High                >499          Passed - Patient is not pregnant      Passed - Valid encounter within last 12 months    Recent Outpatient Visits           9 months ago Olecranon bursitis of left elbow   [place] [facility] Mecum, Erin E, PA-C   10 months ago Uncontrolled type 2 diabetes mellitus with hyperglycemia (HCC)   [place] [facility] [place], [place] T, NP   10 months ago Senile purpura (HCC)   [place] [facility] [place], [place] T, NP   10 months ago Cellulitis of left upper extremity   [place] [facility]  Larae Grooms, NP   1 year ago Olecranon bursitis of left elbow   [place] [facility] Gabriel Cirri, NP       Future Appointments             In 1 month Ol[place], Minna Antis, MD [facility] [place] [facility] at [facility]

## 2023-05-11 ENCOUNTER — Emergency Department: Payer: Medicare Other

## 2023-05-11 ENCOUNTER — Other Ambulatory Visit: Payer: Self-pay

## 2023-05-11 ENCOUNTER — Telehealth: Payer: Self-pay

## 2023-05-11 ENCOUNTER — Emergency Department
Admission: EM | Admit: 2023-05-11 | Discharge: 2023-05-12 | Disposition: A | Discharge status: Home / Self Care | Payer: Medicare Other | Attending: Emergency Medicine | Admitting: Emergency Medicine

## 2023-05-11 ENCOUNTER — Encounter: Payer: Self-pay | Admitting: Emergency Medicine

## 2023-05-11 DIAGNOSIS — R1032 Left lower quadrant pain: Secondary | ICD-10-CM | POA: Insufficient documentation

## 2023-05-11 DIAGNOSIS — A084 Viral intestinal infection, unspecified: Secondary | ICD-10-CM | POA: Diagnosis not present

## 2023-05-11 DIAGNOSIS — I10 Essential (primary) hypertension: Secondary | ICD-10-CM | POA: Insufficient documentation

## 2023-05-11 DIAGNOSIS — R197 Diarrhea, unspecified: Secondary | ICD-10-CM

## 2023-05-11 DIAGNOSIS — E119 Type 2 diabetes mellitus without complications: Secondary | ICD-10-CM | POA: Diagnosis not present

## 2023-05-11 DIAGNOSIS — J432 Centrilobular emphysema: Secondary | ICD-10-CM | POA: Insufficient documentation

## 2023-05-11 LAB — COMPREHENSIVE METABOLIC PANEL
ALT: 34 U/L (ref 0–44)
AST: 35 U/L (ref 15–41)
Albumin: 4.8 g/dL (ref 3.5–5.0)
Alkaline Phosphatase: 76 U/L (ref 38–126)
Anion gap: 14 (ref 5–15)
BUN: 9 mg/dL (ref 8–23)
CO2: 20 mmol/L — ABNORMAL LOW (ref 22–32)
Calcium: 9.5 mg/dL (ref 8.9–10.3)
Chloride: 99 mmol/L (ref 98–111)
Creatinine, Ser: 1.42 mg/dL — ABNORMAL HIGH (ref 0.61–1.24)
GFR, Estimated: 54 mL/min — ABNORMAL LOW (ref >60)
Glucose, Bld: 337 mg/dL — ABNORMAL HIGH (ref 70–99)
Potassium: 3.5 mmol/L (ref 3.5–5.1)
Sodium: 133 mmol/L — ABNORMAL LOW (ref 135–145)
Total Bilirubin: 1 mg/dL (ref 0.3–1.2)
Total Protein: 8.9 g/dL — ABNORMAL HIGH (ref 6.5–8.1)

## 2023-05-11 LAB — URINALYSIS, ROUTINE W REFLEX MICROSCOPIC
Bacteria, UA: NONE SEEN
Bilirubin Urine: NEGATIVE
Glucose, UA: >=500 mg/dL — AB
Hgb urine dipstick: NEGATIVE
Ketones, ur: 5 mg/dL — AB
Leukocytes,Ua: NEGATIVE
Nitrite: NEGATIVE
Protein, ur: 30 mg/dL — AB
Specific Gravity, Urine: 1.039 — ABNORMAL HIGH (ref 1.005–1.030)
pH: 5 (ref 5.0–8.0)

## 2023-05-11 LAB — CBC WITH DIFFERENTIAL/PLATELET
Abs Immature Granulocytes: 0.05 10*3/uL (ref 0.00–0.07)
Basophils Absolute: 0.1 10*3/uL (ref 0.0–0.1)
Basophils Relative: 1 %
Eosinophils Absolute: 0.2 10*3/uL (ref 0.0–0.5)
Eosinophils Relative: 2 %
HCT: 43.9 % (ref 39.0–52.0)
Hemoglobin: 14.1 g/dL (ref 13.0–17.0)
Immature Granulocytes: 0 %
Lymphocytes Relative: 28 %
Lymphs Abs: 3.6 10*3/uL (ref 0.7–4.0)
MCH: 27.1 pg (ref 26.0–34.0)
MCHC: 32.1 g/dL (ref 30.0–36.0)
MCV: 84.4 fL (ref 80.0–100.0)
Monocytes Absolute: 1.3 10*3/uL — ABNORMAL HIGH (ref 0.1–1.0)
Monocytes Relative: 10 %
Neutro Abs: 7.8 10*3/uL — ABNORMAL HIGH (ref 1.7–7.7)
Neutrophils Relative %: 59 %
Platelets: 304 10*3/uL (ref 150–400)
RBC: 5.2 MIL/uL (ref 4.22–5.81)
RDW: 14.6 % (ref 11.5–15.5)
WBC: 13.2 10*3/uL — ABNORMAL HIGH (ref 4.0–10.5)
nRBC: 0 % (ref 0.0–0.2)

## 2023-05-11 LAB — TROPONIN I (HIGH SENSITIVITY)
Troponin I (High Sensitivity): 9 ng/L (ref <18)
Troponin I (High Sensitivity): 8 ng/L (ref <18)

## 2023-05-11 LAB — LIPASE, BLOOD: Lipase: 29 U/L (ref 11–51)

## 2023-05-11 MED ORDER — ONDANSETRON HCL 4 MG PO TABS: 4.0000 mg | ORAL_TABLET | Freq: Four times a day (QID) | ORAL | 0 refills | Status: AC | PRN

## 2023-05-11 MED ORDER — HYDROMORPHONE HCL 1 MG/ML IJ SOLN
1.0000 mg | Freq: Once | INTRAMUSCULAR | Status: AC
  Administered 2023-05-11: 1 mg via INTRAVENOUS
  Filled 2023-05-11: qty 1

## 2023-05-11 MED ORDER — SODIUM CHLORIDE 0.9 % IV BOLUS
1000.0000 mL | Freq: Once | INTRAVENOUS | Status: AC
  Administered 2023-05-11: 1000 mL via INTRAVENOUS

## 2023-05-11 MED ORDER — VANCOMYCIN HCL 125 MG PO CAPS: 125.0000 mg | ORAL_CAPSULE | Freq: Four times a day (QID) | ORAL | 0 refills | Status: AC

## 2023-05-11 MED ORDER — ONDANSETRON HCL 4 MG/2ML IJ SOLN
4.0000 mg | Freq: Once | INTRAMUSCULAR | Status: AC
  Administered 2023-05-11: 4 mg via INTRAVENOUS
  Filled 2023-05-11: qty 2

## 2023-05-11 MED ORDER — IOHEXOL 300 MG/ML  SOLN
100.0000 mL | Freq: Once | INTRAMUSCULAR | Status: AC | PRN
  Administered 2023-05-11: 100 mL via INTRAVENOUS

## 2023-05-11 NOTE — ED Triage Notes (Addendum)
Pt to triage via w/c, pt reports diarrhea x 2wks with decreased appetite; vomitng and generalized abd pain began today; st dx lung CA but no treatment plan currently; st seen PCP today and was informed to obtain stool specimen but has had no episodes of diarrhea today

## 2023-05-11 NOTE — ED Provider Notes (Signed)
[facility] Provider Note    Event Date/Time   First MD Initiated Contact with Patient 05/11/23 2041     (approximate)   History   Abdominal Pain   HPI  Edward Schneider. is a 67 y.o. male with history of T2DM, HTN, pancreatitis, C. difficile presenting to the emergency department for evaluation of diarrhea.  Patient reports that for the past 2 weeks he has had very frequent episodes of nonbloody diarrhea.  He says this is malodorous and feels identical to prior episodes of C. difficile colitis.  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  He significant abdominal pain that is similar to prior episodes.  He does report that he has had some vomiting which is new compared to prior episodes.  His primary doctor actually ordered a stool test, but he reports he has not been eating and actually has not had any diarrhea episodes today though he reports he was having greater than 10 episodes a day prior to this.  No fevers.  Decreased p.o. intake, but was able to tolerate a meal yesterday.    Physical Exam   Triage Vital Signs: ED Triage Vitals [05/11/23 1941]  Encounter Vitals Group     BP 131/87     Systolic BP Percentile      Diastolic BP Percentile      Pulse Rate (!) 130     Resp 18     Temp 97.9 F (36.6 C)     Temp Source Oral     SpO2 97 %     Weight 228 lb (103.4 kg)     Height 5\' 8"  (1.727 m)     Head Circumference      Peak Flow      Pain Score 10     Pain Loc      Pain Education      Exclude from Growth Chart     Most recent vital signs: Vitals:   05/11/23 2330 05/11/23 2335  BP:  133/85  Pulse: 100 99  Resp:  18  Temp:    SpO2: 94% 96%     General: Awake, interactive  CV:  Tachycardic with regular rhythm, normal peripheral perfusion Resp:  Lungs clear, unlabored respirations.  Abd:  Soft, nondistended, tender to palpation diffusely without rebound or guarding most notably in the left lower quadrant Neuro:  Symmetric  facial movement, fluid speech   ED Results / Procedures / Treatments   Labs (all labs ordered are listed, but only abnormal results are displayed) Labs Reviewed  CBC WITH DIFFERENTIAL/PLATELET - Abnormal; Notable for the following components:      Result Value   WBC 13.2 (*)    Neutro Abs 7.8 (*)    Monocytes Absolute 1.3 (*)    All other components within normal limits  COMPREHENSIVE METABOLIC PANEL - Abnormal; Notable for the following components:   Sodium 133 (*)    CO2 20 (*)    Glucose, Bld 337 (*)    Creatinine, Ser 1.42 (*)    Total Protein 8.9 (*)    GFR, Estimated 54 (*)    All other components within normal limits  URINALYSIS, ROUTINE W REFLEX MICROSCOPIC - Abnormal; Notable for the following components:   Color, Urine YELLOW (*)    APPearance CLEAR (*)    Specific Gravity, Urine 1.039 (*)    Glucose, UA >=500 (*)    Ketones, ur 5 (*)    Protein, ur 30 (*)  All other components within normal limits  C DIFFICILE QUICK SCREEN W PCR REFLEX    GASTROINTESTINAL PANEL BY PCR, STOOL (REPLACES STOOL CULTURE)  LIPASE, BLOOD  TROPONIN I (HIGH SENSITIVITY)  TROPONIN I (HIGH SENSITIVITY)     EKG EKG independently reviewed interpreted by myself (ER attending) demonstrates:    RADIOLOGY Imaging independently reviewed and interpreted by myself demonstrates:  CT abdomen pelvis without evidence of megacolon or other acute abnormality  PROCEDURES:  Critical Care performed: No  Procedures   MEDICATIONS ORDERED IN ED: Medications  sodium chloride 0.9 % bolus 1,000 mL (0 mLs Intravenous Stopped 05/11/23 2230)  HYDROmorphone (DILAUDID) injection 1 mg (1 mg Intravenous Given 05/11/23 2158)  ondansetron (ZOFRAN) injection 4 mg (4 mg Intravenous Given 05/11/23 2158)  iohexol (OMNIPAQUE) 300 MG/ML solution 100 mL (100 mLs Intravenous Contrast Given 05/11/23 2225)     IMPRESSION / MDM / ASSESSMENT AND PLAN / ED COURSE  I reviewed the triage vital signs and the nursing  notes.  Differential diagnosis includes, but is not limited to, C. difficile colitis, recurrent pancreatitis, diverticulitis, other acute intra-abdominal process  Patient's presentation is most consistent with acute presentation with potential threat to life or bodily function.  67 year old male presenting to the emergency department with diarrhea and abdominal pain.  He is tachycardic on presentation, but improvement in heart rate to the low 100s after IV fluids, Dilaudid and Zofran.  Patient does have history of chronic pain and takes oral meds for this.  His CT does not demonstrate any acute findings.  He does have a mild leukocytosis WBC of 13.2 and an AKI with creatinine of 1.42 with a baseline around 1.  Urine with trace ketones.  No evidence of infection.  Normal lipase and troponin.  While I do being concern for C. difficile infection based on his history, presentation is reflective of a nonsevere infection.  With this, do think patient is reasonable for outpatient treatment.  I did discuss this with him and he is comfortable with this plan.  Will DC with a 10-day course of vancomycin.  Will also DC with a prescription for Zofran.  Strict return precautions provided.  Patient discharged stable condition.    FINAL CLINICAL IMPRESSION(S) / ED DIAGNOSES   Final diagnoses:  Diarrhea of presumed infectious origin  Left lower quadrant abdominal pain     Rx / DC Orders   ED Discharge Orders          Ordered    vancomycin (VANCOCIN) 125 MG capsule  4 times daily        05/11/23 2335    ondansetron (ZOFRAN) 4 MG tablet  Every 6 hours PRN        05/11/23 2336             Note:  This document was prepared using Dragon voice recognition software and may include unintentional dictation errors.   Trinna Post, MD 05/11/23 [phone]

## 2023-05-11 NOTE — Discharge Instructions (Signed)
You are seen for abdominal pain and diarrhea.  Your workup was reassuring, but as we discussed I am concerned about a C. difficile infection.  I did send oral antibiotics to your pharmacy.  Please take these as directed.  I have also sent a prescription for nausea medicine that you can take as needed.  Drop off a stool sample with your primary care doctor.  Return to the ER for new or worsening symptoms including uncontrolled pain, inability tolerate food or liquids, or any other new or concerning symptoms.

## 2023-05-11 NOTE — Telephone Encounter (Signed)
Can you give patient a callback to see why he is not able to get pods.

## 2023-05-12 NOTE — Telephone Encounter (Signed)
LVM that we have ordered all of his supplies and the distributor has called him wanting to speak to him before they can send them out.  He was told to call them and that wen have done everything and that it is now up to him to call them to arrange shipment

## 2023-05-24 ENCOUNTER — Other Ambulatory Visit: Payer: Self-pay | Admitting: Nurse Practitioner

## 2023-05-25 NOTE — Telephone Encounter (Signed)
Requested Prescriptions  Pending Prescriptions Disp Refills   famotidine (PEPCID) 20 MG tablet [Pharmacy Med Name: FAMOTIDINE 20MG  TABLETS] 180 tablet 0    Sig: TAKE 1 TABLET BY MOUTH TWICE DAILY     Gastroenterology:  H2 Antagonists Passed - 05/24/2023  3:45 AM      Passed - Valid encounter within last 12 months    Recent Outpatient Visits           10 months ago Olecranon bursitis of left elbow   [place] [facility] Mecum, Erin E, PA-C   10 months ago Uncontrolled type 2 diabetes mellitus with hyperglycemia (HCC)   [place] [facility] [place], Corrie Dandy T, NP   10 months ago Senile purpura (HCC)   [place] [facility] [place], Corrie Dandy T, NP   11 months ago Cellulitis of left upper extremity   [place] [facility] Larae Grooms, NP   1 year ago Olecranon bursitis of left elbow   [place] [facility] Gabriel Cirri, NP       Future Appointments             In 3 weeks Tomma Lightning, MD [facility] [place] [facility] at [facility]

## 2023-05-27 ENCOUNTER — Telehealth: Payer: Self-pay

## 2023-05-27 NOTE — Telephone Encounter (Signed)
Spoke with [facility] cares and they are putting order through for insulin. Patient didn't have his medication on auto fill so he has to call when needed. I had them to place on auto fill.

## 2023-05-27 NOTE — Telephone Encounter (Signed)
Attempted to contact patient but there was no answer. VM left for patient to callback asap.  We have attempted to call back several times about supply issues and blood sugars.

## 2023-05-27 NOTE — Telephone Encounter (Signed)
Attempted to contact patient again no answer 

## 2023-05-31 NOTE — Progress Notes (Deleted)
Name: Edward Schneider.  Age/ Sex: 67 y.o., male   MRN/ DOB: [id number], [birth year]     PCP: System, Provider Not In   Reason for Endocrinology Evaluation: Type 2 Diabetes Mellitus  Initial Endocrine Consultative Visit: 01/24/2019    PATIENT IDENTIFIER: Edward Schneider. is a 67 y.o. male with a past medical history of T2DM, pancreatitis 02/2022 while on Trulicity . The patient has followed with Endocrinology clinic since 01/24/2019 for consultative assistance with management of his diabetes.  DIABETIC HISTORY:  Edward Schneider was diagnosed with T2DM in 2017. He has been on Jardiance in 2017 but due to cost was discontinued, as well as [place]. His hemoglobin A1c has ranged from 6.2% in 2018, peaking at 10.0 % in 2019.  On his initial visit to our clinic his A1c 10.9% , he was on metformin which we stopped in 04/2019 due to diarrhea.    Trulicity was discontinued in May 2023 due to acute pancreatitis  S/P PCI with DES 08/2019 SUBJECTIVE:   During the last visit (08/04/2022): A1c 10.4%.     Today (05/31/2023): Edward Schneider is here for a follow up on his diabetes management. He checks his blood sugars 2-3 times daily through freestyle libre. The patient have has not had a hypoglycemic episodes since the last clinic visit .   He was seen by podiatry in 08/10/2022 He continues to follow-up with orthopedics for right hip arthritis  Denies nausea, diarrhea but has nausea and constipation  Pt with sob and cough and pleurisy , pending appointment with new PCP  He is on CPAP   HOME DIABETES REGIMEN:  Basaglar 40 units QHS  Humalog 22/16/16 Farxiga 10 mg daily  Correction factor: Humalog (BG -130/25)    CONTINUOUS GLUCOSE MONITORING RECORD INTERPRETATION : He forgot his receiver today      DIABETIC COMPLICATIONS: Microvascular complications:  Neuropathy  Denies: CKD, retinopathy  Last eye exam: Completed 03/2022  Macrovascular complications:  CAD and CVA Denies: PVD     HISTORY:  Past Medical History:  Past Medical History:  Diagnosis Date   Allergy    Aortic atherosclerosis (HCC)    Asthma    C. difficile diarrhea    Chronic pain    Collagenous colitis    Coronary artery disease    a.) PCI with 2.75 x 18 mm Resolute Onyx DES x 1 to prox/mid LAD on 09/05/2019   DDD (degenerative disc disease), cervical    DDD (degenerative disc disease), lumbar    GERD (gastroesophageal reflux disease)    Grade I diastolic dysfunction    Hepatic steatosis    Hyperlipidemia    Hypertension    Liver cancer (HCC) 03/2015   Migraines    Myocardial infarction (HCC)     OSA on CPAP    Seizures (HCC)    several as child when sick.  None since age 72   Stroke [facility])    'mini-stroke" 30 yrs ago. no deficits.   T2DM (type 2 diabetes mellitus) (HCC)    Wears dentures    full upper and lower   Past Surgical History:  Past Surgical History:  Procedure Laterality Date   APPENDECTOMY     BACK SURGERY     CARDIAC CATHETERIZATION     No stent placed in his "61's"   CERVICAL FUSION     COLONOSCOPY WITH PROPOFOL N/A 03/06/2016   Procedure: COLONOSCOPY WITH PROPOFOL;  Surgeon: Midge Minium, MD;  Location: [facility];  Service: Endoscopy;  Laterality: N/A;  requests early   COLONOSCOPY WITH PROPOFOL N/A 06/18/2020   Procedure: COLONOSCOPY WITH PROPOFOL;  Surgeon: Midge Minium, MD;  Location: [facility];  Service: Endoscopy;  Laterality: N/A;   CORONARY PRESSURE/FFR STUDY N/A 09/05/2019   Procedure: INTRAVASCULAR PRESSURE WIRE/FFR STUDY;  Surgeon: Yvonne Kendall, MD;  Location: [facility];  Service: Cardiovascular;  Laterality: N/A;   ESOPHAGOGASTRODUODENOSCOPY (EGD) WITH PROPOFOL N/A 09/20/2017   Procedure: ESOPHAGOGASTRODUODENOSCOPY (EGD) WITH PROPOFOL;  Surgeon: Midge Minium, MD;  Location: [facility];  Service: Endoscopy;  Laterality: N/A;  Diabetic - oral meds   FINGER SURGERY Left    KNEE SURGERY Right    LEFT HEART CATH AND  CORONARY ANGIOGRAPHY Left 09/05/2019   Procedure: LEFT HEART CATH AND CORONARY ANGIOGRAPHY (2.75 x 18 mm Resolute Onyx DES x 1 to prox/mid LAD);  Surgeon: Yvonne Kendall, MD;  Location: [facility];  Service: Cardiovascular;  Laterality: Left;   NECK SURGERY     spleen surgery     TOE SURGERY Right    TOTAL HIP ARTHROPLASTY Right 06/03/2021   Procedure: TOTAL HIP ARTHROPLASTY ANTERIOR APPROACH;  Surgeon: Kennedy Bucker, MD;  Location: [facility] ORS;  Service: Orthopedics;  Laterality: Right;   TOTAL KNEE ARTHROPLASTY Right 08/22/2021   Procedure: TOTAL KNEE ARTHROPLASTY;  Surgeon: Kennedy Bucker, MD;  Location: [facility];  Service: Orthopedics;  Laterality: Right;   Social History:  reports that he quit smoking about 13 years ago. His smoking use included cigarettes. He started smoking about 63 years ago. He has a 100 pack-year smoking history. He has never used smokeless tobacco. He reports current drug use. He reports that he does not drink alcohol. Family History:  Family History  Problem Relation Age of Onset   Arthritis Mother    Diabetes Mother    Kidney disease Mother    Heart disease Mother    Hypertension Mother    Arthritis Father    Hearing loss Father    Hypertension Father    Heart disease Father    Diabetes Sister    Heart disease Sister    Diabetes Daughter    Diabetes Maternal Aunt    Diabetes Maternal Grandmother    Heart Problems Brother    Heart Problems Brother    Heart Problems Brother    Heart attack Maternal Grandfather    Colon cancer Paternal Grandfather      HOME MEDICATIONS: Allergies as of 06/01/2023       Reactions   Bee Venom Anaphylaxis   Crestor [rosuvastatin Calcium] Shortness Of Breath, Swelling   Fentanyl Itching, Hives   blisters Patch   Gabapentin Diarrhea   Severe diarrhea which caused incontinence, loss of appetite and weight loss.   Shellfish Allergy Anaphylaxis, Swelling   Shrimp causes throat to swell and tingling in tongue.     Fire Ant    Furosemide Nausea And Vomiting   Buprenorphine Hcl Itching   Chlorhexidine Gluconate Itching, Rash   Simvastatin Diarrhea        Medication List        Accurate as of May 31, 2023  2:34 PM. If you have any questions, ask your nurse or doctor.          Accu-Chek Guide test strip Generic drug: glucose blood Check blood sugar 2 times daily   Accu-Chek Softclix Lancets lancets Check blood sugar 2 times daily   acetaminophen 650 MG CR tablet Commonly known as: TYLENOL Take 1,300 mg by mouth every 8 (eight)  hours.   albuterol 108 (90 Base) MCG/ACT inhaler Commonly known as: VENTOLIN HFA INHALE 2 PUFFS INTO THE LUNGS EVERY 6 HOURS AS NEEDED FOR WHEEZING OR SHORTNESS OF BREATH   aspirin EC 81 MG tablet Take 81 mg by mouth daily. Swallow whole.   atorvastatin 80 MG tablet Commonly known as: LIPITOR TAKE 1 TABLET(80 MG) BY MOUTH AT BEDTIME   benazepril 10 MG tablet Commonly known as: LOTENSIN Take 1 tablet (10 mg total) by mouth daily.   bisacodyl 5 MG EC tablet Commonly known as: Dulcolax Take 2 tablets (10 mg total) by mouth daily in the afternoon.   Breztri Aerosphere 160-9-4.8 MCG/ACT Aero Generic drug: Budeson-Glycopyrrol-Formoterol Inhale 2 puffs into the lungs in the morning and at bedtime.   calcium carbonate 600 MG Tabs tablet Commonly known as: OS-CAL Take 600 mg by mouth daily.   Centrum Silver 50+Men Tabs Take 1 tablet by mouth daily.   cetirizine 10 MG tablet Commonly known as: ZYRTEC Take 1 tablet (10 mg total) by mouth daily.   CINNAMON PO Take 1,000 mg by mouth 2 (two) times daily.   clobetasol cream 0.05 % Commonly known as: TEMOVATE Apply 1 application topically 2 (two) times daily.   Comfort EZ Pen Needles 32G X 4 MM Misc Generic drug: Insulin Pen Needle Use to inject insulin 5 times daily   cyanocobalamin 1000 MCG tablet Commonly known as: VITAMIN B12 Take 1,000 mcg by mouth daily.   cyclobenzaprine 10 MG  tablet Commonly known as: FLEXERIL Take 1 tablet (10 mg total) by mouth 3 (three) times daily as needed for muscle spasms.   dapagliflozin propanediol 10 MG Tabs tablet Commonly known as: Farxiga Take 1 tablet (10 mg total) by mouth daily before breakfast.   Dexcom G6 Sensor Misc 1 Device by Does not apply route as directed.   Dexcom G6 Transmitter Misc 1 Device by Does not apply route as directed.   diclofenac Sodium 1 % Gel Commonly known as: VOLTAREN Apply 2 g topically 4 (four) times daily as needed (pain).   diphenhydrAMINE 25 mg capsule Commonly known as: BENADRYL Take 50 mg by mouth in the morning, at noon, and at bedtime.   doxepin 25 MG capsule Commonly known as: SINEQUAN Take 2 capsules (50 mg total) by mouth at bedtime.   DULoxetine 60 MG capsule Commonly known as: CYMBALTA Take 1 capsule (60 mg total) by mouth at bedtime.   EPINEPHrine 0.3 mg/0.3 mL Soaj injection Commonly known as: EPI-PEN Inject 0.3 mg into the muscle as needed for anaphylaxis.   ezetimibe 10 MG tablet Commonly known as: ZETIA Take 1 tablet (10 mg total) by mouth daily.   famotidine 20 MG tablet Commonly known as: PEPCID TAKE 1 TABLET BY MOUTH TWICE DAILY   GINGER PO Take 1 tablet by mouth daily.   Ginseng 100 MG Caps Take 100 mg by mouth daily.   insulin lispro 100 UNIT/ML injection Commonly known as: HumaLOG Max daily 100 units   IRON-C PO Take 30 mg by mouth daily.   isosorbide mononitrate 60 MG 24 hr tablet Commonly known as: IMDUR Take 1 tablet (60 mg total) by mouth daily.   lidocaine 2 % solution Commonly known as: XYLOCAINE Use as directed 15 mLs in the mouth or throat as needed for mouth pain.   Minoxidil 5 % Foam Apply 1 g topically daily.   mometasone 0.1 % ointment Commonly known as: ELOCON Apply topically 2 (two) times daily as needed.   mupirocin ointment 2 %  Commonly known as: BACTROBAN Apply 1 Application topically 2 (two) times daily.   naproxen  sodium 220 MG tablet Commonly known as: ALEVE Take 440 mg by mouth daily as needed (body aches).   Narcan 4 MG/0.1ML Liqd nasal spray kit Generic drug: naloxone Place 0.4 mg into the nose as needed (opioid overdose).   nitroGLYCERIN 0.4 MG SL tablet Commonly known as: NITROSTAT Place 0.4 mg under the tongue every 5 (five) minutes as needed for chest pain.   Omega-3 1000 MG Caps Take 1,000 mg by mouth daily.   Omnipod 5 G6 Intro (Gen 5) Kit 1 Device by Does not apply route every other day.   Omnipod 5 G6 Pods (Gen 5) Misc Change pod every other day   ondansetron 4 MG tablet Commonly known as: ZOFRAN TAKE 1 TABLET(4 MG) BY MOUTH EVERY 8 HOURS AS NEEDED FOR NAUSEA OR VOMITING   OxyCONTIN 10 MG 12 hr tablet Generic drug: oxyCODONE Take 10 mg by mouth every 12 (twelve) hours.   oxyCODONE 15 MG immediate release tablet Commonly known as: ROXICODONE Take 15 mg by mouth every 4 (four) hours as needed. :1 Tablet(s) By Mouth Every 4-6 Hours   pantoprazole 40 MG tablet Commonly known as: PROTONIX TAKE 1 TABLET(40 MG) BY MOUTH TWICE DAILY   Pharmacist Choice Alcohol Pads SMARTSIG:1 Each Topical 4 Times Daily   polyethylene glycol 17 g packet Commonly known as: MIRALAX / GLYCOLAX Take 17 g by mouth 2 (two) times daily.   pregabalin 75 MG capsule Commonly known as: LYRICA Take 75 mg by mouth 2 (two) times daily.   senna-docusate 8.6-50 MG tablet Commonly known as: Senokot-S Take 1 tablet by mouth 2 (two) times daily.   sucralfate 1 g tablet Commonly known as: CARAFATE TAKE 1 TABLET BY MOUTH FOUR TIMES DAILY AT BEDTIME WITH MEALS   traZODone 100 MG tablet Commonly known as: DESYREL Take 1 tablet (100 mg total) by mouth at bedtime as needed. for sleep   Trulance 3 MG Tabs Generic drug: Plecanatide Take 1 tablet by mouth daily.   vitamin A 16109 UNIT capsule Take 10,000 Units by mouth daily.   vitamin C 1000 MG tablet Take 1,000 mg by mouth daily.   Vitamin D3 25  MCG (1000 UT) Chew Chew 1,000 Units by mouth daily.   Vitamin E 400 units Tabs Take 400 Units by mouth daily.         OBJECTIVE:   Vital Signs: There were no vitals taken for this visit.  Wt Readings from Last 3 Encounters:  05/11/23 228 lb (103.4 kg)  02/02/23 249 lb (112.9 kg)  12/11/22 251 lb (113.9 kg)     Exam: General: Pt appears well and is in NAD  Lungs: Clear with good BS bilat with no rales, rhonchi, or wheezes  Heart: RRR   Extremities: No pretibial edema.    Neuro: MS is good with appropriate affect, pt is alert and Ox3       DM foot exam: 08/04/2022 The skin of the feet is without sores or ulcerations The pedal pulses are 1+ B/L The sensation is absent to a screening 5.07, 10 gram monofilament on the right          DATA REVIEWED:  Lab Results  Component Value Date   HGBA1C 10.6 (H) 02/02/2023   HGBA1C 10.4 (H) 07/08/2022   HGBA1C 9.8 (H) 03/05/2022    Latest Reference Range & Units 07/08/22 13:55  Sodium 134 - 144 mmol/L 135  Potassium 3.5 -  5.2 mmol/L 4.9  Chloride 96 - 106 mmol/L 99  CO2 20 - 29 mmol/L 19 (L)  Glucose 70 - 99 mg/dL 540 (H)  BUN 8 - 27 mg/dL 15  Creatinine 9.81 - 1.91 mg/dL 4.78  Calcium 8.6 - 29.5 mg/dL 9.7  BUN/Creatinine Ratio 10 - 24  14  eGFR >59 mL/min/1.73 79  Magnesium 1.6 - 2.3 mg/dL 1.8  Alkaline Phosphatase 44 - 121 IU/L 70  Albumin 3.9 - 4.9 g/dL 4.3  Albumin/Globulin Ratio 1.2 - 2.2  1.4  Amylase, Serum 31 - 110 U/L 79  Lipase 13 - 78 U/L 86 (H)  AST 0 - 40 IU/L 24  ALT 0 - 44 IU/L 23  Total Protein 6.0 - 8.5 g/dL 7.3  Total Bilirubin 0.0 - 1.2 mg/dL 0.4     Latest Reference Range & Units 07/08/22 13:55  Cholesterol, Total 100 - 199 mg/dL 621  HDL Cholesterol >30 mg/dL 47  Triglycerides 0 - 865 mg/dL 784  VLDL Cholesterol Cal 5 - 40 mg/dL 25  LDL Chol Calc (NIH) 0 - 99 mg/dL 47     ASSESSMENT / PLAN / RECOMMENDATIONS:   1) 1) Type 2 Diabetes Mellitus, Poorly  control, With neuropathic and  macrovascular complications - Most recent A1c of  9.7 %. Goal A1c < 7.0 %.     - Per pt , he recently had an A1c at PCPs office  9.7 % -Trulicity had to be discontinued due to pancreatitis in May 2023, hence all GLP-1 agonist including Mounjaro are contraindicated -He is on patient assistance program for his basal, prandial and [place] -Unfortunately he continues with hyperglycemia despite increasing his insulin doses, it appears that he has not been using the correction scale, today I reviewed the correction scale with him and the importance of using it before each meal -He did not qualify for the tandem pump, he did receive his OmniPod awaiting on training. -He will need to switch freestyle libre to [facility] G6, a prescription has been printed and will be faxed to DME supplier  MEDICATIONS:  Increase Basaglar  50 units daily  Increase Humalog 25 units with Breakfast , 20 units with Lunch and 20 units with supper Continue Farxiga 10 mg, 1 tablet with Breakfast  Start correction factor: Humalog (BG -130/25)     EDUCATION / INSTRUCTIONS: BG monitoring instructions: Patient is instructed to check his blood sugars 4 times a day, before meals . Call [place] [facility] if: BG persistently < 70 I reviewed the Rule of 15 for the treatment of hypoglycemia in detail with the patient. Literature supplied.    F/U in  6 months    Signed electronically by: Lyndle Herrlich, MD  [facility] Endocrinology  [facility] [address] [place], [place] [address] Phone: [phone] FAX: [phone]   CC: System, Provider Not In No address on file Phone: None  Fax: None  Return to Endocrinology clinic as below: Future Appointments  Date Time Provider Department Center  06/01/2023  8:30 AM , Konrad Dolores, MD [facility] None  06/16/2023  1:30 PM Tomma Lightning, MD [facility] None

## 2023-06-01 ENCOUNTER — Ambulatory Visit: Payer: Medicare Other | Admitting: Internal Medicine

## 2023-06-10 ENCOUNTER — Ambulatory Visit: Payer: Medicare Other | Admitting: Dietician

## 2023-06-16 ENCOUNTER — Other Ambulatory Visit: Payer: Self-pay | Admitting: Nurse Practitioner

## 2023-06-16 ENCOUNTER — Ambulatory Visit: Payer: Medicare Other | Admitting: Pulmonary Disease

## 2023-06-16 DIAGNOSIS — E1142 Type 2 diabetes mellitus with diabetic polyneuropathy: Secondary | ICD-10-CM

## 2023-06-17 NOTE — Telephone Encounter (Signed)
Requested medication (s) are due for refill today: expired medication  Requested medication (s) are on the active medication list: yes   Last refill:  04/17/22 #90 4 refills  Future visit scheduled: no   Notes to clinic:  expired medication . Do you want to renew Rx?     Requested Prescriptions  Pending Prescriptions Disp Refills   DULoxetine (CYMBALTA) 60 MG capsule [Pharmacy Med Name: DULOXETINE DR 60MG  CAPSULES] 90 capsule 4    Sig: TAKE 1 CAPSULE(60 MG) BY MOUTH AT BEDTIME     Psychiatry: Antidepressants - SNRI - duloxetine Failed - 06/16/2023  3:43 AM      Failed - Cr in normal range and within 360 days    Creat  Date Value Ref Range Status  12/19/2021 1.10 0.70 - 1.35 mg/dL Final   Creatinine, Ser  Date Value Ref Range Status  05/11/2023 1.42 (H) 0.61 - 1.24 mg/dL Final         Failed - Valid encounter within last 6 months    Recent Outpatient Visits           10 months ago Olecranon bursitis of left elbow   [place] [facility] Mecum, Erin E, PA-C   11 months ago Uncontrolled type 2 diabetes mellitus with hyperglycemia (HCC)   [place] [facility] Townsend, Stockwell T, NP   11 months ago Senile purpura (HCC)   [place] [facility] Elizabeth, Corrie Dandy T, NP   11 months ago Cellulitis of left upper extremity   [place] [facility] Larae Grooms, NP   1 year ago Olecranon bursitis of left elbow   [place] [facility] Gabriel Cirri, NP       Future Appointments             In 1 month Tomma Lightning, MD [place] [facility] - eGFR is 30 or above and within 360 days    EGFR (African American)  Date Value Ref Range Status  02/28/2014 >60  Final   GFR calc Af Amer  Date Value Ref Range Status  12/06/2020 75 >59 mL/min/1.73 Final    Comment:    **In accordance with recommendations from the NKF-ASN Task force,**   [facility] is in the  process of updating its eGFR calculation to the   2021 CKD-EPI creatinine equation that estimates kidney function   without a race variable.    EGFR (Non-African Amer.)  Date Value Ref Range Status  02/28/2014 >60  Final    Comment:    eGFR values <69mL/min/1.73 m2 may be an indication of chronic kidney disease (CKD). Calculated eGFR is useful in patients with stable renal function. The eGFR calculation will not be reliable in acutely ill patients when serum creatinine is changing rapidly. It is not useful in  patients on dialysis. The eGFR calculation may not be applicable to patients at the low and high extremes of body sizes, pregnant women, and vegetarians.    GFR, Estimated  Date Value Ref Range Status  05/11/2023 54 (L) >60 mL/min Final    Comment:    (NOTE) Calculated using the CKD-EPI Creatinine Equation (2021)    GFR  Date Value Ref Range Status  01/24/2019 66.47 >60.00 mL/min Final   eGFR  Date Value Ref Range Status  07/08/2022 79 >59 mL/min/1.73 Final         Passed - Completed  PHQ-2 or PHQ-9 in the last 360 days      Passed - Last BP in normal range    BP Readings from Last 1 Encounters:  05/11/23 133/85

## 2023-06-23 ENCOUNTER — Other Ambulatory Visit: Payer: Self-pay | Admitting: Nurse Practitioner

## 2023-06-24 LAB — HM DIABETES EYE EXAM

## 2023-06-24 NOTE — Telephone Encounter (Signed)
Requested medications are due for refill today.  yes  Requested medications are on the active medications list.  yes  Last refill. 04/17/2022 #90 4 rf for both  Future visit scheduled.   no  Notes to clinic.  No pcp listed. Please advise.    Requested Prescriptions  Pending Prescriptions Disp Refills   traZODone (DESYREL) 100 MG tablet [Pharmacy Med Name: TRAZODONE 100MG  TABLETS] 90 tablet 4    Sig: TAKE 1 TABLET(100 MG) BY MOUTH AT BEDTIME AS NEEDED FOR SLEEP     Psychiatry: Antidepressants - Serotonin Modulator Failed - 06/23/2023  3:43 AM      Failed - Valid encounter within last 6 months    Recent Outpatient Visits           11 months ago Olecranon bursitis of left elbow   [place] [facility] Mecum, Erin E, PA-C   11 months ago Uncontrolled type 2 diabetes mellitus with hyperglycemia (HCC)   [place] [facility] Pax, Corrie Dandy T, NP   11 months ago Senile purpura (HCC)   [place] [facility], Corrie Dandy T, NP   12 months ago Cellulitis of left upper extremity   [place] [facility] Larae Grooms, NP   1 year ago Olecranon bursitis of left elbow   [place] [facility] Gabriel Cirri, NP       Future Appointments             In 1 month Olalere, Minna Antis, MD [place] [facility] [place] - Completed PHQ-2 or PHQ-9 in the last 360 days       ezetimibe (ZETIA) 10 MG tablet [Pharmacy Med Name: EZETIMIBE 10MG  TABLETS] 90 tablet 4    Sig: TAKE 1 TABLET(10 MG) BY MOUTH DAILY     Cardiovascular:  Antilipid - Sterol Transport Inhibitors Failed - 06/23/2023  3:43 AM      Failed - Lipid Panel in normal range within the last 12 months    Cholesterol, Total  Date Value Ref Range Status  07/08/2022 119 100 - 199 mg/dL Final   Cholesterol Piccolo, Waived  Date Value Ref Range Status  11/03/2017 152 <200 mg/dL Final    Comment:                             Desirable                <200                         Borderline High      200- 239                         High                     >239    LDL Chol Calc (NIH)  Date Value Ref Range Status  07/08/2022 47 0 - 99 mg/dL Final   HDL  Date Value Ref Range Status  07/08/2022 47 >39 mg/dL Final   Triglycerides  Date Value Ref Range Status  07/08/2022 144 0 - 149 mg/dL Final   Triglycerides Piccolo,Waived  Date Value Ref Range Status  11/03/2017 203 (H) <150 mg/dL Final    Comment:  Normal                   <150                         Borderline High     150 - 199                         High                200 - 499                         Very High                >499          Passed - AST in normal range and within 360 days    AST  Date Value Ref Range Status  05/11/2023 35 15 - 41 U/L Final   SGOT(AST)  Date Value Ref Range Status  02/28/2014 20 15 - 37 Unit/L Final   AST (SGOT) Piccolo, Waived  Date Value Ref Range Status  11/03/2017 51 (H) 11 - 38 U/L Final         Passed - ALT in normal range and within 360 days    ALT  Date Value Ref Range Status  05/11/2023 34 0 - 44 U/L Final   SGPT (ALT)  Date Value Ref Range Status  02/28/2014 25 12 - 78 U/L Final   ALT (SGPT) Piccolo, Waived  Date Value Ref Range Status  11/03/2017 58 (H) 10 - 47 U/L Final         Passed - Patient is not pregnant      Passed - Valid encounter within last 12 months    Recent Outpatient Visits           11 months ago Olecranon bursitis of left elbow   [place] [facility] Mecum, Erin E, PA-C   11 months ago Uncontrolled type 2 diabetes mellitus with hyperglycemia (HCC)   [place] [facility] [place], [place] T, NP   11 months ago Senile purpura (HCC)   [place] [facility] Herman, Corrie Dandy T, NP   12 months ago Cellulitis of left upper extremity   [place] [facility] Larae Grooms, NP   1 year ago Olecranon bursitis of left elbow   [place] [facility] Gabriel Cirri, NP       Future Appointments             In 1 month Olalere, Minna Antis, MD [place] [facility]

## 2023-06-29 ENCOUNTER — Encounter: Payer: Self-pay | Admitting: Internal Medicine

## 2023-07-22 ENCOUNTER — Other Ambulatory Visit: Payer: Self-pay | Admitting: Nurse Practitioner

## 2023-07-22 NOTE — Telephone Encounter (Signed)
No longer under provider care. Requested Prescriptions  Pending Prescriptions Disp Refills   doxepin (SINEQUAN) 25 MG capsule [Pharmacy Med Name: DOXEPIN 25MG  CAPSULES] 180 capsule 4    Sig: TAKE 2 CAPSULES(50 MG) BY MOUTH AT BEDTIME     Psychiatry:  Antidepressants - Heterocyclics (TCAs) Failed - 07/22/2023  3:43 AM      Failed - Completed PHQ-2 or PHQ-9 in the last 360 days      Failed - Valid encounter within last 6 months    Recent Outpatient Visits           11 months ago Olecranon bursitis of left elbow   [place] [facility] Mecum, Erin E, PA-C   1 year ago Uncontrolled type 2 diabetes mellitus with hyperglycemia (HCC)   [place] [facility] [place], Corrie Dandy T, NP   1 year ago Senile purpura (HCC)   [place] [facility] [place], Corrie Dandy T, NP   1 year ago Cellulitis of left upper extremity   [place] [facility] Larae Grooms, NP   1 year ago Olecranon bursitis of left elbow   [place] [facility] Gabriel Cirri, NP       Future Appointments             In 4 days Tomma Lightning, MD [place]           Psychiatry:  Antidepressants - Heterocyclics (TCAs) - doxepin Failed - 07/22/2023  3:43 AM      Failed - Valid encounter within last 12 months    Recent Outpatient Visits           11 months ago Olecranon bursitis of left elbow   [place] [facility] Mecum, Erin E, PA-C   1 year ago Uncontrolled type 2 diabetes mellitus with hyperglycemia (HCC)   [place] [facility] [place], Corrie Dandy T, NP   1 year ago Senile purpura (HCC)   [place] [facility] [place], Corrie Dandy T, NP   1 year ago Cellulitis of left upper extremity   [place] [facility] Larae Grooms, NP   1 year ago Olecranon bursitis of left elbow   [place] [facility] Gabriel Cirri, NP       Future  Appointments             In 4 days Tomma Lightning, MD [place] [facility]             isosorbide mononitrate (IMDUR) 60 MG 24 hr tablet [Pharmacy Med Name: ISOSORBIDE MONONITRATE 60MG  ER TABS] 90 tablet 2    Sig: TAKE 1 TABLET(60 MG) BY MOUTH DAILY     Cardiovascular:  Nitrates Failed - 07/22/2023  3:43 AM      Failed - Valid encounter within last 12 months    Recent Outpatient Visits           11 months ago Olecranon bursitis of left elbow   [place] [facility] Mecum, Erin E, PA-C   1 year ago Uncontrolled type 2 diabetes mellitus with hyperglycemia (HCC)   [place] [facility] [place], Corrie Dandy T, NP   1 year ago Senile purpura (HCC)   [place] [facility] [place], Corrie Dandy T, NP   1 year ago Cellulitis of left upper extremity    [facility] Larae Grooms, NP   1 year ago Olecranon bursitis  of left elbow   [place] [facility] Gabriel Cirri, NP       Future Appointments             In 4 days Tomma Lightning, MD [place] [facility] - Last BP in normal range    BP Readings from Last 1 Encounters:  05/11/23 133/85         Passed - Last Heart Rate in normal range    Pulse Readings from Last 1 Encounters:  05/11/23 99

## 2023-07-26 ENCOUNTER — Ambulatory Visit: Payer: Medicare Other | Admitting: Pulmonary Disease

## 2023-07-26 ENCOUNTER — Encounter: Payer: Self-pay | Admitting: Pulmonary Disease

## 2023-07-26 VITALS — BP 120/74 | HR 88 | Temp 97.6°F | Ht 68.0 in | Wt 229.0 lb

## 2023-07-26 DIAGNOSIS — G4730 Sleep apnea, unspecified: Secondary | ICD-10-CM | POA: Diagnosis not present

## 2023-07-26 DIAGNOSIS — J431 Panlobular emphysema: Secondary | ICD-10-CM | POA: Diagnosis not present
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
## 2023-07-26 NOTE — Patient Instructions (Signed)
I will see you back in 6 months  Continue inhalers  Continue BiPAP  Call us with significant concerns  Make sure you keep the appointment with further evaluation of the new findings on the scans

## 2023-07-29 ENCOUNTER — Other Ambulatory Visit: Payer: Self-pay | Admitting: Family Medicine

## 2023-07-29 ENCOUNTER — Ambulatory Visit
Admission: RE | Admit: 2023-07-29 | Discharge: 2023-07-29 | Disposition: A | Discharge status: Home / Self Care | Payer: Medicare Other | Source: Ambulatory Visit | Attending: Family Medicine | Admitting: Family Medicine

## 2023-07-29 DIAGNOSIS — M25511 Pain in right shoulder: Secondary | ICD-10-CM

## 2023-08-03 ENCOUNTER — Other Ambulatory Visit: Payer: Self-pay | Admitting: Nurse Practitioner

## 2023-08-03 NOTE — Telephone Encounter (Signed)
Requested medication (s) are due for refill today: routing for review  Requested medication (s) are on the active medication list: yes  Last refill:  05/07/23  Future visit scheduled: no  Notes to clinic:  Unable to refill per protocol, routing for review     Requested Prescriptions  Pending Prescriptions Disp Refills   atorvastatin (LIPITOR) 80 MG tablet [Pharmacy Med Name: ATORVASTATIN 80MG  TABLETS] 90 tablet 0    Sig: TAKE 1 TABLET(80 MG) BY MOUTH AT BEDTIME     Cardiovascular:  Antilipid - Statins Failed - 08/03/2023  3:44 AM      Failed - Valid encounter within last 12 months    Recent Outpatient Visits           1 year ago Olecranon bursitis of left elbow   [place] [facility] Mecum, Erin E, PA-C   1 year ago Uncontrolled type 2 diabetes mellitus with hyperglycemia (HCC)   [place] [facility] [place], Corrie Dandy T, NP   1 year ago Senile purpura (HCC)   [place] [facility] [place], Corrie Dandy T, NP   1 year ago Cellulitis of left upper extremity   [place] [facility] Larae Grooms, NP   1 year ago Olecranon bursitis of left elbow    [facility] Gabriel Cirri, NP              Failed - Lipid Panel in normal range within the last 12 months    Cholesterol, Total  Date Value Ref Range Status  07/08/2022 119 100 - 199 mg/dL Final   Cholesterol Piccolo, Waived  Date Value Ref Range Status  11/03/2017 152 <200 mg/dL Final    Comment:                            Desirable                <200                         Borderline High      200- 239                         High                     >239    LDL Chol Calc (NIH)  Date Value Ref Range Status  07/08/2022 47 0 - 99 mg/dL Final   HDL  Date Value Ref Range Status  07/08/2022 47 >39 mg/dL Final   Triglycerides  Date Value Ref Range Status  07/08/2022 144 0 - 149 mg/dL Final   Triglycerides Piccolo,Waived  Date Value  Ref Range Status  11/03/2017 203 (H) <150 mg/dL Final    Comment:                            Normal                   <150                         Borderline High     150 - 199                         High  200 - 499                         Very High                >499          Passed - Patient is not pregnant

## 2023-08-14 ENCOUNTER — Other Ambulatory Visit: Payer: Self-pay | Admitting: Nurse Practitioner

## 2023-08-16 NOTE — Telephone Encounter (Signed)
Requested medications are due for refill today.  yes  Requested medications are on the active medications list.  yes  Last refill. 06/01/2022 #90 4rf  Future visit scheduled.   no  Notes to clinic.  No pcp listed.    Requested Prescriptions  Pending Prescriptions Disp Refills   pantoprazole (PROTONIX) 40 MG tablet [Pharmacy Med Name: PANTOPRAZOLE 40MG  TABLETS] 180 tablet 4    Sig: TAKE 1 TABLET(40 MG) BY MOUTH TWICE DAILY     Gastroenterology: Proton Pump Inhibitors Failed - 08/14/2023  3:38 AM      Failed - Valid encounter within last 12 months    Recent Outpatient Visits           1 year ago Olecranon bursitis of left elbow   [place] [facility] Mecum, Erin E, PA-C   1 year ago Uncontrolled type 2 diabetes mellitus with hyperglycemia (HCC)   [place] [facility] [place], Corrie Dandy T, NP   1 year ago Senile purpura (HCC)   [place] [facility] [place], Corrie Dandy T, NP   1 year ago Cellulitis of left upper extremity   [facility] Larae Grooms, NP   1 year ago Olecranon bursitis of left elbow   [place] [facility] Gabriel Cirri, NP

## 2023-08-23 ENCOUNTER — Encounter: Payer: Self-pay | Admitting: Internal Medicine

## 2023-08-23 ENCOUNTER — Telehealth: Payer: Self-pay

## 2023-08-23 ENCOUNTER — Ambulatory Visit: Payer: Medicare Other | Admitting: Internal Medicine

## 2023-08-23 VITALS — BP 134/88 | HR 77 | Ht 68.0 in | Wt 229.0 lb

## 2023-08-23 DIAGNOSIS — E1159 Type 2 diabetes mellitus with other circulatory complications: Secondary | ICD-10-CM | POA: Diagnosis not present

## 2023-08-23 DIAGNOSIS — Z794 Long term (current) use of insulin: Secondary | ICD-10-CM | POA: Diagnosis not present

## 2023-08-23 DIAGNOSIS — E1165 Type 2 diabetes mellitus with hyperglycemia: Secondary | ICD-10-CM | POA: Diagnosis not present

## 2023-08-23 DIAGNOSIS — E1142 Type 2 diabetes mellitus with diabetic polyneuropathy: Secondary | ICD-10-CM | POA: Diagnosis not present

## 2023-08-23 DIAGNOSIS — Z7984 Long term (current) use of oral hypoglycemic drugs: Secondary | ICD-10-CM

## 2023-08-23 LAB — POCT GLYCOSYLATED HEMOGLOBIN (HGB A1C): Hemoglobin A1C: 10.1 % — AB (ref 4.0–5.6)

## 2023-08-23 LAB — POCT GLUCOSE (DEVICE FOR HOME USE): POC Glucose: 325 mg/dL — AB (ref 70–99)

## 2023-08-23 MED ORDER — FREESTYLE LIBRE 3 PLUS SENSOR MISC: 1.0000 | 3 refills | Status: DC

## 2023-08-23 NOTE — Patient Instructions (Addendum)
-   Restart Basaglar 54 units daily  - Continue Humalog 22 units with each meal  - Continue  Farxiga 10 mg , 1 tablet with Breakfast    -Humalog correctional insulin: ADD extra units on insulin to your meal-time Humalog dose if your blood sugars are higher than 155. Use the scale below to help guide you:   Blood sugar before meal Number of units to inject  Less than 155 0 unit  156 -  180 1 units  181 -  205 2 units  206 -  230 3 units  231 -  255 4 units  256 -  280 5 units  281 -  305 6 units  306 -  330 7 units  331 -  355 8 units    HOW TO TREAT LOW BLOOD SUGARS (Blood sugar LESS THAN 70 MG/DL) Please follow the RULE OF 15 for the treatment of hypoglycemia treatment (when your (blood sugars are less than 70 mg/dL)   STEP 1: Take 15 grams of carbohydrates when your blood sugar is low, which includes:  3-4 GLUCOSE TABS  OR 3-4 OZ OF JUICE OR REGULAR SODA OR ONE TUBE OF GLUCOSE GEL    STEP 2: RECHECK blood sugar in 15 MINUTES STEP 3: If your blood sugar is still low at the 15 minute recheck --> then, go back to STEP 1 and treat AGAIN with another 15 grams of carbohydrates.

## 2023-08-23 NOTE — Progress Notes (Unsigned)
Name: Edward Schneider.  Age/ Sex: 67 y.o., male   MRN/ DOB: [id number], [birth year]     PCP: System, Provider Not In   Reason for Endocrinology Evaluation: Type 2 Diabetes Mellitus  Initial Endocrine Consultative Visit: 01/24/2019    PATIENT IDENTIFIER: Edward Schneider. is a 67 y.o. male with a past medical history of T2DM, pancreatitis 02/2022 while on Trulicity . The patient has followed with Endocrinology clinic since 01/24/2019 for consultative assistance with management of his diabetes.  DIABETIC HISTORY:  Mr. Kimberley was diagnosed with T2DM in 2017. He has been on Jardiance in 2017 but due to cost was discontinued, as well as [place]. His hemoglobin A1c has ranged from 6.2% in 2018, peaking at 10.0 % in 2019.  On his initial visit to our clinic his A1c 10.9% , he was on metformin which we stopped in 04/2019 due to diarrhea.    Trulicity was discontinued in May 2023 due to acute pancreatitis  S/P PCI with DES 08/2019   He was trained on OmniPod use 03/2023 but this was cost prohibitive SUBJECTIVE:   During the last visit (08/04/2022): A1c 10.4%.     Today (08/23/2023): Mr. Eckels is here for a follow up on his diabetes management. He checks his blood sugars 2-3 times daily through freestyle libre. The patient have has not had a hypoglycemic episodes since the last clinic visit .  Denies nausea or vomiting   Has occasional constipation but no diarrhea  He is on CPAP   He has not been on basaglar for a while    HOME DIABETES REGIMEN:  Basaglar 40 units QHS - not taking  Humalog 22 units TIDQAC Farxiga 10 mg daily  Correction factor: Humalog (BG -130/25)    DIABETIC COMPLICATIONS: Microvascular complications:  Neuropathy  Denies: CKD, retinopathy  Last eye exam: Completed 03/2022  Macrovascular complications:  CAD and CVA Denies: PVD    HISTORY:  Past Medical History:  Past Medical History:  Diagnosis Date   Allergy    Aortic atherosclerosis (HCC)     Asthma    C. difficile diarrhea    Chronic pain    Collagenous colitis    Coronary artery disease    a.) PCI with 2.75 x 18 mm Resolute Onyx DES x 1 to prox/mid LAD on 09/05/2019   DDD (degenerative disc disease), cervical    DDD (degenerative disc disease), lumbar    GERD (gastroesophageal reflux disease)    Grade I diastolic dysfunction    Hepatic steatosis    Hyperlipidemia    Hypertension    Liver cancer (HCC) 03/2015   Migraines    Myocardial infarction (HCC)     OSA on CPAP    Seizures (HCC)    several as child when sick.  None since age 55   Stroke [facility])    'mini-stroke" 30 yrs ago. no deficits.   T2DM (type 2 diabetes mellitus) (HCC)    Wears dentures    full upper and lower   Past Surgical History:  Past Surgical History:  Procedure Laterality Date   APPENDECTOMY     BACK SURGERY     CARDIAC CATHETERIZATION     No stent placed in his "63's"   CERVICAL FUSION     COLONOSCOPY WITH PROPOFOL N/A 03/06/2016   Procedure: COLONOSCOPY WITH PROPOFOL;  Surgeon: Midge Minium, MD;  Location: [facility];  Service: Endoscopy;  Laterality: N/A;  requests early   COLONOSCOPY WITH PROPOFOL N/A 06/18/2020  Procedure: COLONOSCOPY WITH PROPOFOL;  Surgeon: Midge Minium, MD;  Location: [facility] END[facility]COPY;  Service: Endoscopy;  Laterality: N/A;   CORONARY PRESSURE/FFR STUDY N/A 09/05/2019   Procedure: INTRAVASCULAR PRESSURE WIRE/FFR STUDY;  Surgeon: Yvonne Kendall, MD;  Location: [facility] INVAS[facility] CV LAB;  Service: Cardiovascular;  Laterality: N/A;   ESOPHAGOGASTRODUODENOSCOPY (EGD) WITH PROPOFOL N/A 09/20/2017   Procedure: ESOPHAGOGASTRODUODENOSCOPY (EGD) WITH PROPOFOL;  Surgeon: Midge Minium, MD;  Location: [facility];  Service: Endoscopy;  Laterality: N/A;  Diabetic - oral meds   FINGER SURGERY Left    KNEE SURGERY Right    LEFT HEART CATH AND CORONARY ANGIOGRAPHY Left 09/05/2019   Procedure: LEFT HEART CATH AND CORONARY ANGIOGRAPHY (2.75 x 18 mm Resolute Onyx DES x 1 to  prox/mid LAD);  Surgeon: Yvonne Kendall, MD;  Location: [facility] INV[facility] CV [facility];  Service: Cardiovascular;  Laterality: Left;   NECK SURGERY     spleen surgery     TOE SURGERY Right    TOTAL HIP ARTHROPLASTY Right 06/03/2021   Procedure: TOTAL HIP ARTHROPLASTY ANTERIOR APPROACH;  Surgeon: Kennedy Bucker, MD;  Location: [facility] ORS;  Service: Orthopedics;  Laterality: Right;   TOTAL KNEE ARTHROPLASTY Right 08/22/2021   Procedure: TOTAL KNEE ARTHROPLASTY;  Surgeon: Kennedy Bucker, MD;  Location: [facility] ORS;  Service: Orthopedics;  Laterality: Right;   Social History:  reports that he quit smoking about 13 years ago. His smoking use included cigarettes. He started smoking about 63 years ago. He has a 100 pack-year smoking history. He has never used smokeless tobacco. He reports current drug use. He reports that he does not drink alcohol. Family History:  Family History  Problem Relation Age of Onset   Arthritis Mother    Diabetes Mother    Kidney disease Mother    Heart disease Mother    Hypertension Mother    Arthritis Father    Hearing loss Father    Hypertension Father    Heart disease Father    Diabetes Sister    Heart disease Sister    Diabetes Daughter    Diabetes Maternal Aunt    Diabetes Maternal Grandmother    Heart Problems Brother    Heart Problems Brother    Heart Problems Brother    Heart attack Maternal Grandfather    Colon cancer Paternal Grandfather      HOME MEDICATIONS: Allergies as of 08/23/2023       Reactions   Bee Venom Anaphylaxis   Crestor [rosuvastatin Calcium] Shortness Of Breath, Swelling   Fentanyl Itching, Hives   blisters Patch   Gabapentin Diarrhea   Severe diarrhea which caused incontinence, loss of appetite and weight loss.   Shellfish Allergy Anaphylaxis, Swelling   Shrimp causes throat to swell and tingling in tongue.    Fire Ant    Furosemide Nausea And Vomiting   Buprenorphine Hcl Itching   Chlorhexidine Gluconate Itching, Rash   Simvastatin  Diarrhea        Medication List        Accurate as of August 23, 2023 11:54 AM. If you have any questions, ask your nurse or doctor.          Accu-Chek Guide test strip Generic drug: glucose blood Check blood sugar 2 times daily   Accu-Chek Softclix Lancets lancets Check blood sugar 2 times daily   acetaminophen 650 MG CR tablet Commonly known as: TYLENOL Take 1,300 mg by mouth every 8 (eight) hours.   albuterol 108 (90 Base) MCG/ACT inhaler Commonly known as: VENTOLIN HFA INHALE  2 PUFFS INTO THE LUNGS EVERY 6 HOURS AS NEEDED FOR WHEEZING OR SHORTNESS OF BREATH   aspirin EC 81 MG tablet Take 81 mg by mouth daily. Swallow whole.   atorvastatin 80 MG tablet Commonly known as: LIPITOR TAKE 1 TABLET(80 MG) BY MOUTH AT BEDTIME   benazepril 10 MG tablet Commonly known as: LOTENSIN Take 1 tablet (10 mg total) by mouth daily.   bisacodyl 5 MG EC tablet Commonly known as: Dulcolax Take 2 tablets (10 mg total) by mouth daily in the afternoon.   Breztri Aerosphere 160-9-4.8 MCG/ACT Aero Generic drug: Budeson-Glycopyrrol-Formoterol Inhale 2 puffs into the lungs in the morning and at bedtime.   calcium carbonate 600 MG Tabs tablet Commonly known as: OS-CAL Take 600 mg by mouth daily.   Centrum Silver 50+Men Tabs Take 1 tablet by mouth daily.   cetirizine 10 MG tablet Commonly known as: ZYRTEC Take 1 tablet (10 mg total) by mouth daily.   CINNAMON PO Take 1,000 mg by mouth 2 (two) times daily.   clobetasol cream 0.05 % Commonly known as: TEMOVATE Apply 1 application topically 2 (two) times daily.   Comfort EZ Pen Needles 32G X 4 MM Misc Generic drug: Insulin Pen Needle Use to inject insulin 5 times daily   cyanocobalamin 1000 MCG tablet Commonly known as: VITAMIN B12 Take 1,000 mcg by mouth daily.   cyclobenzaprine 10 MG tablet Commonly known as: FLEXERIL Take 1 tablet (10 mg total) by mouth 3 (three) times daily as needed for muscle spasms.    cyclobenzaprine 5 MG tablet Commonly known as: FLEXERIL 1 tablet at bedtime as needed Orally Once a day for 90 days muscle spasms   dapagliflozin propanediol 10 MG Tabs tablet Commonly known as: Farxiga Take 1 tablet (10 mg total) by mouth daily before breakfast.   Dexcom G6 Sensor Misc 1 Device by Does not apply route as directed.   Dexcom G6 Transmitter Misc 1 Device by Does not apply route as directed.   diclofenac Sodium 1 % Gel Commonly known as: VOLTAREN Apply 2 g topically 4 (four) times daily as needed (pain).   diphenhydrAMINE 25 mg capsule Commonly known as: BENADRYL Take 50 mg by mouth in the morning, at noon, and at bedtime.   doxepin 25 MG capsule Commonly known as: SINEQUAN Take 2 capsules (50 mg total) by mouth at bedtime.   DULoxetine 60 MG capsule Commonly known as: CYMBALTA TAKE 1 CAPSULE(60 MG) BY MOUTH AT BEDTIME   EPINEPHrine 0.3 mg/0.3 mL Soaj injection Commonly known as: EPI-PEN Inject 0.3 mg into the muscle as needed for anaphylaxis.   ezetimibe 10 MG tablet Commonly known as: ZETIA Take 1 tablet (10 mg total) by mouth daily.   famotidine 20 MG tablet Commonly known as: PEPCID TAKE 1 TABLET BY MOUTH TWICE DAILY   GINGER PO Take 1 tablet by mouth daily.   Ginseng 100 MG Caps Take 100 mg by mouth daily.   insulin lispro 100 UNIT/ML injection Commonly known as: HumaLOG Max daily 100 units   IRON-C PO Take 30 mg by mouth daily.   isosorbide mononitrate 60 MG 24 hr tablet Commonly known as: IMDUR Take 1 tablet (60 mg total) by mouth daily.   lidocaine 2 % solution Commonly known as: XYLOCAINE Use as directed 15 mLs in the mouth or throat as needed for mouth pain.   Minoxidil 5 % Foam Apply 1 g topically daily.   mometasone 0.1 % ointment Commonly known as: ELOCON Apply topically 2 (two) times  daily as needed.   mupirocin ointment 2 % Commonly known as: BACTROBAN Apply 1 Application topically 2 (two) times daily.    naproxen sodium 220 MG tablet Commonly known as: ALEVE Take 440 mg by mouth daily as needed (body aches).   Narcan 4 MG/0.1ML Liqd nasal spray kit Generic drug: naloxone Place 0.4 mg into the nose as needed (opioid overdose).   nitroGLYCERIN 0.4 MG SL tablet Commonly known as: NITROSTAT Place 0.4 mg under the tongue every 5 (five) minutes as needed for chest pain.   Omega-3 1000 MG Caps Take 1,000 mg by mouth daily.   Omnipod 5 DexG7G6 Intro Gen 5 Kit 1 Device by Does not apply route every other day.   Omnipod 5 DexG7G6 Pods Gen 5 Misc Change pod every other day   ondansetron 4 MG disintegrating tablet Commonly known as: ZOFRAN-ODT Take 2 mg by mouth every 6 (six) hours as needed.   ondansetron 4 MG tablet Commonly known as: ZOFRAN TAKE 1 TABLET(4 MG) BY MOUTH EVERY 8 HOURS AS NEEDED FOR NAUSEA OR VOMITING   OxyCONTIN 10 MG 12 hr tablet Generic drug: oxyCODONE Take 10 mg by mouth every 12 (twelve) hours.   oxyCODONE 15 MG immediate release tablet Commonly known as: ROXICODONE Take 15 mg by mouth every 4 (four) hours as needed. :1 Tablet(s) By Mouth Every 4-6 Hours   oxyCODONE 15 MG immediate release tablet Commonly known as: ROXICODONE 1 tablet Orally every 4 TO 6 hrs for 30 days MODERATE PAIN   pantoprazole 40 MG tablet Commonly known as: PROTONIX TAKE 1 TABLET(40 MG) BY MOUTH TWICE DAILY   Pharmacist Choice Alcohol Pads SMARTSIG:1 Each Topical 4 Times Daily   polyethylene glycol 17 g packet Commonly known as: MIRALAX / GLYCOLAX Take 17 g by mouth 2 (two) times daily.   pregabalin 75 MG capsule Commonly known as: LYRICA Take 75 mg by mouth 2 (two) times daily.   senna-docusate 8.6-50 MG tablet Commonly known as: Senokot-S Take 1 tablet by mouth 2 (two) times daily.   sucralfate 1 g tablet Commonly known as: CARAFATE TAKE 1 TABLET BY MOUTH FOUR TIMES DAILY AT BEDTIME WITH MEALS   traZODone 100 MG tablet Commonly known as: DESYREL Take 1 tablet (100  mg total) by mouth at bedtime as needed. for sleep   Trulance 3 MG Tabs Generic drug: Plecanatide Take 1 tablet by mouth daily.   vitamin A 62376 UNIT capsule Take 10,000 Units by mouth daily.   vitamin C 1000 MG tablet Take 1,000 mg by mouth daily.   Vitamin D3 25 MCG (1000 UT) Chew Chew 1,000 Units by mouth daily.   Vitamin E 400 units Tabs Take 400 Units by mouth daily.         OBJECTIVE:   Vital Signs: BP 134/88 (BP Location: Left Arm, Patient Position: Sitting, Cuff Size: Large)   Pulse 77   Ht 5\' 8"  (1.727 m)   Wt 229 lb (103.9 kg)   SpO2 97%   BMI 34.82 kg/m   Wt Readings from Last 3 Encounters:  08/23/23 229 lb (103.9 kg)  07/26/23 229 lb (103.9 kg)  05/11/23 228 lb (103.4 kg)     Exam: General: Pt appears well and is in NAD  Lungs: Clear with good BS bilat with no rales, rhonchi, or wheezes  Heart: RRR   Extremities: No pretibial edema.    Neuro: MS is good with appropriate affect, pt is alert and Ox3       DM foot  exam: 08/23/2023 The skin of the feet is without sores or ulcerations The pedal pulses are 1+ B/L The sensation is absent to a screening 5.07, 10 gram monofilament on the right          DATA REVIEWED:  Lab Results  Component Value Date   HGBA1C 10.6 (H) 02/02/2023   HGBA1C 10.4 (H) 07/08/2022   HGBA1C 9.8 (H) 03/05/2022    Latest Reference Range & Units 05/11/23 19:45  Sodium 135 - 145 mmol/L 133 (L)  Potassium 3.5 - 5.1 mmol/L 3.5  Chloride 98 - 111 mmol/L 99  CO2 22 - 32 mmol/L 20 (L)  Glucose 70 - 99 mg/dL 161 (H)  BUN 8 - 23 mg/dL 9  Creatinine 0.96 - 0.45 mg/dL 4.09 (H)  Calcium 8.9 - 10.3 mg/dL 9.5  Anion gap 5 - 15  14  Alkaline Phosphatase 38 - 126 U/L 76  Albumin 3.5 - 5.0 g/dL 4.8  Lipase 11 - 51 U/L 29  AST 15 - 41 U/L 35  ALT 0 - 44 U/L 34  Total Protein 6.5 - 8.1 g/dL 8.9 (H)  Total Bilirubin 0.3 - 1.2 mg/dL 1.0  GFR, Estimated >81 mL/min 54 (L)  (L): Data is abnormally low (H): Data is abnormally  high    ASSESSMENT / PLAN / RECOMMENDATIONS:   1) 1) Type 2 Diabetes Mellitus, Poorly  control, With neuropathic and macrovascular complications - Most recent A1c of  10.1 %. Goal A1c < 7.0 %.     -Patient continues with hyperglycemia -He has not been on basal insulin months, a new patient assistance form were  filled today -Trulicity had to be discontinued due to pancreatitis in May 2023, hence all GLP-1 agonist including Mounjaro are contraindicated -He is on patient assistance program for his basal, prandial and Farxiga -He did not qualify for the tandem pump, he was trained on the OmniPod but it was close prohibitive at $800 co-pay -He does have Dexcom at home, but he was not aware that he could use it by itself without the pump, patient may use Dexcom, he was provided with extra sensors today and a prescription for freestyle Josephine Igo has been sent  MEDICATIONS:  Restart Basaglar  54 units daily  Continue Humalog 22 units with each meal  Continue Farxiga 10 mg, 1 tablet with Breakfast  Continue correction factor: Humalog (BG -130/25)     EDUCATION / INSTRUCTIONS: BG monitoring instructions: Patient is instructed to check his blood sugars 4 times a day, before meals . Call [facility] if: BG persistently < 70 I reviewed the Rule of 15 for the treatment of hypoglycemia in detail with the patient. Literature supplied.    F/U in  3 months    Signed electronically by: Lyndle Herrlich, MD  [facility]  [facility] [address] [place], [place] [address] Phone: [phone] FAX: [phone]   CC: System, Provider Not In No address on file Phone: None  Fax: None  Return to Endocrinology clinic as below: Future Appointments  Date Time Provider Department Center  08/23/2023 12:10 PM Reginald Mangels, Konrad Dolores, MD [facility] None

## 2023-08-23 NOTE — Telephone Encounter (Signed)
Medication Samples have been provided to the patient.  Drug name:   [name]     Strength: U200        Qty: 1 box  LOT: [id number]  Exp.Date: 04/17/25  Dosing instructions: INJECT 54 UNITS ONCE WEEKLY   The patient has been instructed regarding the correct time, dose, and frequency of taking this medication, including desired effects and most common side effects.   Edward Schneider L Infiniti Hoefling 12:29 PM 08/23/2023

## 2023-08-27 ENCOUNTER — Telehealth: Payer: Self-pay

## 2023-08-27 NOTE — Telephone Encounter (Signed)
Patient states that he did have low blood sugar on Monday and Tuesday night but not sure about Wednesday

## 2023-08-27 NOTE — Telephone Encounter (Signed)
Patient advised and verbalized understanding 

## 2023-08-27 NOTE — Telephone Encounter (Signed)
Patient states that since he started back on Basaglar he has been having chills, nausea, and body aches. Patient didn't take the Basaglar last night.

## 2023-09-14 ENCOUNTER — Emergency Department: Payer: Medicare Other

## 2023-09-14 ENCOUNTER — Other Ambulatory Visit: Payer: Self-pay

## 2023-09-14 ENCOUNTER — Emergency Department
Admission: EM | Admit: 2023-09-14 | Discharge: 2023-09-14 | Disposition: A | Discharge status: Home / Self Care | Payer: Medicare Other | Attending: Emergency Medicine | Admitting: Emergency Medicine

## 2023-09-14 DIAGNOSIS — R1010 Upper abdominal pain, unspecified: Secondary | ICD-10-CM | POA: Diagnosis not present

## 2023-09-14 DIAGNOSIS — Z955 Presence of coronary angioplasty implant and graft: Secondary | ICD-10-CM | POA: Insufficient documentation

## 2023-09-14 DIAGNOSIS — Z8673 Personal history of transient ischemic attack (TIA), and cerebral infarction without residual deficits: Secondary | ICD-10-CM | POA: Insufficient documentation

## 2023-09-14 DIAGNOSIS — I2510 Atherosclerotic heart disease of native coronary artery without angina pectoris: Secondary | ICD-10-CM | POA: Insufficient documentation

## 2023-09-14 DIAGNOSIS — G8929 Other chronic pain: Secondary | ICD-10-CM | POA: Insufficient documentation

## 2023-09-14 DIAGNOSIS — R079 Chest pain, unspecified: Secondary | ICD-10-CM | POA: Diagnosis present

## 2023-09-14 LAB — CBC
HCT: 39.7 % (ref 39.0–52.0)
Hemoglobin: 12.9 g/dL — ABNORMAL LOW (ref 13.0–17.0)
MCH: 28 pg (ref 26.0–34.0)
MCHC: 32.5 g/dL (ref 30.0–36.0)
MCV: 86.3 fL (ref 80.0–100.0)
Platelets: 203 10*3/uL (ref 150–400)
RBC: 4.6 MIL/uL (ref 4.22–5.81)
RDW: 14.7 % (ref 11.5–15.5)
WBC: 12.3 10*3/uL — ABNORMAL HIGH (ref 4.0–10.5)
nRBC: 0 % (ref 0.0–0.2)

## 2023-09-14 LAB — TROPONIN I (HIGH SENSITIVITY)
Troponin I (High Sensitivity): 7 ng/L (ref <18)
Troponin I (High Sensitivity): 8 ng/L (ref <18)

## 2023-09-14 LAB — BASIC METABOLIC PANEL
Anion gap: 10 (ref 5–15)
BUN: 22 mg/dL (ref 8–23)
CO2: 22 mmol/L (ref 22–32)
Calcium: 9.7 mg/dL (ref 8.9–10.3)
Chloride: 104 mmol/L (ref 98–111)
Creatinine, Ser: 0.91 mg/dL (ref 0.61–1.24)
GFR, Estimated: >60 mL/min (ref >60)
Glucose, Bld: 116 mg/dL — ABNORMAL HIGH (ref 70–99)
Potassium: 3.4 mmol/L — ABNORMAL LOW (ref 3.5–5.1)
Sodium: 136 mmol/L (ref 135–145)

## 2023-09-14 LAB — PROTIME-INR
INR: 1.1 (ref 0.8–1.2)
Prothrombin Time: 13.9 s (ref 11.4–15.2)

## 2023-09-14 MED ORDER — IOHEXOL 350 MG/ML SOLN
100.0000 mL | Freq: Once | INTRAVENOUS | Status: AC | PRN
  Administered 2023-09-14: 100 mL via INTRAVENOUS

## 2023-09-14 MED ORDER — ONDANSETRON HCL 4 MG/2ML IJ SOLN
4.0000 mg | Freq: Once | INTRAMUSCULAR | Status: AC
  Administered 2023-09-14: 4 mg via INTRAVENOUS
  Filled 2023-09-14: qty 2

## 2023-09-14 MED ORDER — HYDROMORPHONE HCL 1 MG/ML IJ SOLN
1.0000 mg | Freq: Once | INTRAMUSCULAR | Status: AC
  Administered 2023-09-14: 1 mg via INTRAVENOUS
  Filled 2023-09-14: qty 1

## 2023-09-14 NOTE — ED Notes (Signed)
Patient discharged at this time. Ambulated to lobby with independent and steady gait. Breathing unlabored speaking in full sentences. Verbalized understanding of all discharge, follow up, and medication teaching. Discharged homed with all belongings.   

## 2023-09-14 NOTE — ED Provider Notes (Signed)
[facility] Provider Note    Event Date/Time   First MD Initiated Contact with Patient 09/14/23 1939     (approximate)  History   Chief Complaint: Chest Pain  HPI  Edward Schneider. is a 67 y.o. male with a past medical history of CAD, CVA, 1 stent, chronic pain, presents to the emergency department for chest and abdominal pain.  According to the patient over the last 2 days he has been experiencing severe pain in his chest as well as times in his abdomen.  Patient is a very poor historian, at times states that hurts in the center of his chest, and then corrects himself and states that hurting in the upper abdomen not the chest.  Patient states he has taken nitroglycerin with no response to the pain.  Patient is on chronic oxycodone at home but is not helping the pain so the patient came to the emergency department for evaluation.  No shortness of breath.  Physical Exam   Triage Vital Signs: ED Triage Vitals [09/14/23 1801]  Encounter Vitals Group     BP 121/69     Systolic BP Percentile      Diastolic BP Percentile      Pulse Rate (!) 110     Resp 18     Temp 97.9 F (36.6 C)     Temp Source Oral     SpO2 98 %     Weight 218 lb (98.9 kg)     Height 5\' 8"  (1.727 m)     Head Circumference      Peak Flow      Pain Score 6     Pain Loc      Pain Education      Exclude from Growth Chart     Most recent vital signs: Vitals:   09/14/23 1801  BP: 121/69  Pulse: (!) 110  Resp: 18  Temp: 97.9 F (36.6 C)  SpO2: 98%    General: Awake, mild distress complaining of pain CV:  Good peripheral perfusion.  Regular rate and rhythm  Resp:  Normal effort.  Equal breath sounds bilaterally.  Abd:  No distention.  Tender to palpation throughout.  ED Results / Procedures / Treatments   EKG  EKG viewed and interpreted by myself shows what appears to be a sinus rhythm at 109 bpm with a narrow QRS, normal axis, QTc prolongation otherwise normal intervals  nonspecific ST changes.  RADIOLOGY  I have reviewed and interpreted chest x-ray images.  No consolidation on my evaluation. Radiology has read the x-ray is negative   MEDICATIONS ORDERED IN ED: Medications  HYDROmorphone (DILAUDID) injection 1 mg (has no administration in time range)  ondansetron (ZOFRAN) injection 4 mg (has no administration in time range)     IMPRESSION / MDM / ASSESSMENT AND PLAN / ED COURSE  I reviewed the triage vital signs and the nursing notes.  Patient's presentation is most consistent with acute presentation with potential threat to life or bodily function.  Patient presents to the emergency department for chest pain.  Patient is a very poor historian, states he has been having worsening chest pain over the past 2 possibly 3 days.  At times he states that is hurting in the center of his chest, at other times he corrects me and shows that is hurting in the upper abdomen.  He then states that the upper abdomen pain is chronic and he has seen GI but no one will  do anything for his pain.  Patient states the chest pain is new also states he often will get chest pain.  Patient does have a history of a stent follows up with Dr. Okey Dupre per patient.  States he is taken nitroglycerin at home without relief.  Patient also appears to be on significant amount of pain medication at home per PDMP.  His labs are reassuring including normal CBC which with a slight leukocytosis 12,300, reassuring chemistry with a negative troponin.  However given the patient's difficult exam given the nature of the pain being acute on chronic as well as past cardiac disease we will proceed with a CTA scan of the chest and abdomen to further evaluate help rule out any aortic syndrome.  We will treat the patient's pain while in the emergency department.  Given the negative troponin this is reassuring given 2 to 3 days of discomfort that there is no heart damage.  Patient's repeat troponin remains negative.   CTA of the chest abdomen and pelvis shows no acute finding.  Small fat-containing umbilical hernia.  Given the patient's reassuring workup including negative troponin x 2 and negative CTA we will discharge the patient with cardiology follow-up with Dr. Okey Dupre.  FINAL CLINICAL IMPRESSION(S) / ED DIAGNOSES   Chest pain Abdominal pain   Note:  This document was prepared using Dragon voice recognition software and may include unintentional dictation errors.   Minna Antis, MD 09/14/23 2152

## 2023-09-14 NOTE — ED Notes (Signed)
ED Provider at bedside. 

## 2023-09-14 NOTE — ED Triage Notes (Signed)
Patient to ED via [facility] from home for chest pain. Pain started yesterday and reproducible. PT states he took 5 nitros at home PTA. Given 324 ASA by EMS. Hx of 2 stents, HTN, blood thinners.   20 R Hand

## 2023-09-14 NOTE — Discharge Instructions (Signed)
Please call the number provided for cardiology to arrange a follow-up appointment.  Return to the emergency department for any symptom personally concerning to yourself.

## 2023-09-15 ENCOUNTER — Telehealth: Payer: Self-pay | Admitting: Internal Medicine

## 2023-09-15 NOTE — Telephone Encounter (Signed)
   Pt c/o of Chest Pain: STAT if active CP, including tightness, pressure, jaw pain, radiating pain to shoulder/upper arm/back, CP unrelieved by Nitro. Symptoms reported of SOB, nausea, vomiting, sweating.  1. Are you having CP right now? yes    2. Are you experiencing any other symptoms (ex. SOB, nausea, vomiting, sweating)? Sob, nausea, sweating. Backpain    3. Is your CP continuous or coming and going? Comes and goes   4. Have you taken Nitroglycerin? 5 since yesterday   5. How long have you been experiencing CP? 3 days    6. If NO CP at time of call then end call with telling Pt to call back or call 911 if Chest pain returns prior to return call from triage team.

## 2023-09-15 NOTE — Telephone Encounter (Signed)
Spoke with patient. Patient with complaint of stabbing left side chest pain that started three days ago. Patient reports that he had shortness of breath yesterday but that has improved today. Patient states that the whole left side of his body feels weird. He states that his left arm and leg feel tingly and that started yesterday. Patient states that he has taken nitroglycerin and 1 MG percocet with no relief. Patient informed that they are no appointments available today and recommended to the patient he should go to the Emergency Department. Patient verbalizes understanding.

## 2023-09-17 ENCOUNTER — Other Ambulatory Visit: Payer: Self-pay | Admitting: Nurse Practitioner

## 2023-09-17 DIAGNOSIS — E1142 Type 2 diabetes mellitus with diabetic polyneuropathy: Secondary | ICD-10-CM

## 2023-09-20 ENCOUNTER — Emergency Department: Payer: Medicare Other

## 2023-09-20 ENCOUNTER — Other Ambulatory Visit: Payer: Self-pay

## 2023-09-20 ENCOUNTER — Observation Stay
Admission: EM | Admit: 2023-09-20 | Discharge: 2023-09-21 | Disposition: A | Discharge status: Home / Self Care | Payer: Medicare Other | Attending: Internal Medicine | Admitting: Internal Medicine

## 2023-09-20 DIAGNOSIS — R109 Unspecified abdominal pain: Secondary | ICD-10-CM | POA: Diagnosis present

## 2023-09-20 DIAGNOSIS — I10 Essential (primary) hypertension: Secondary | ICD-10-CM

## 2023-09-20 DIAGNOSIS — J45909 Unspecified asthma, uncomplicated: Secondary | ICD-10-CM | POA: Diagnosis not present

## 2023-09-20 DIAGNOSIS — Z87891 Personal history of nicotine dependence: Secondary | ICD-10-CM | POA: Diagnosis not present

## 2023-09-20 DIAGNOSIS — E875 Hyperkalemia: Secondary | ICD-10-CM | POA: Diagnosis not present

## 2023-09-20 DIAGNOSIS — R1084 Generalized abdominal pain: Principal | ICD-10-CM | POA: Insufficient documentation

## 2023-09-20 DIAGNOSIS — Z8505 Personal history of malignant neoplasm of liver: Secondary | ICD-10-CM | POA: Diagnosis not present

## 2023-09-20 DIAGNOSIS — Z79899 Other long term (current) drug therapy: Secondary | ICD-10-CM | POA: Insufficient documentation

## 2023-09-20 DIAGNOSIS — E1165 Type 2 diabetes mellitus with hyperglycemia: Secondary | ICD-10-CM | POA: Diagnosis not present

## 2023-09-20 DIAGNOSIS — Z8673 Personal history of transient ischemic attack (TIA), and cerebral infarction without residual deficits: Secondary | ICD-10-CM | POA: Insufficient documentation

## 2023-09-20 DIAGNOSIS — I2510 Atherosclerotic heart disease of native coronary artery without angina pectoris: Secondary | ICD-10-CM | POA: Diagnosis not present

## 2023-09-20 DIAGNOSIS — E785 Hyperlipidemia, unspecified: Secondary | ICD-10-CM

## 2023-09-20 DIAGNOSIS — M5459 Other low back pain: Secondary | ICD-10-CM | POA: Insufficient documentation

## 2023-09-20 DIAGNOSIS — Z96641 Presence of right artificial hip joint: Secondary | ICD-10-CM | POA: Diagnosis not present

## 2023-09-20 DIAGNOSIS — Z794 Long term (current) use of insulin: Secondary | ICD-10-CM | POA: Diagnosis not present

## 2023-09-20 DIAGNOSIS — K219 Gastro-esophageal reflux disease without esophagitis: Secondary | ICD-10-CM | POA: Diagnosis not present

## 2023-09-20 DIAGNOSIS — Z96651 Presence of right artificial knee joint: Secondary | ICD-10-CM | POA: Insufficient documentation

## 2023-09-20 DIAGNOSIS — E119 Type 2 diabetes mellitus without complications: Secondary | ICD-10-CM

## 2023-09-20 DIAGNOSIS — R55 Syncope and collapse: Secondary | ICD-10-CM

## 2023-09-20 DIAGNOSIS — G8929 Other chronic pain: Secondary | ICD-10-CM

## 2023-09-20 LAB — LIPASE, BLOOD: Lipase: 38 U/L (ref 11–51)

## 2023-09-20 LAB — URINALYSIS, ROUTINE W REFLEX MICROSCOPIC
Bilirubin Urine: NEGATIVE
Glucose, UA: NEGATIVE mg/dL
Hgb urine dipstick: NEGATIVE
Ketones, ur: NEGATIVE mg/dL
Leukocytes,Ua: NEGATIVE
Nitrite: NEGATIVE
Protein, ur: NEGATIVE mg/dL
Specific Gravity, Urine: 1.043 — ABNORMAL HIGH (ref 1.005–1.030)
pH: 5 (ref 5.0–8.0)

## 2023-09-20 LAB — CBC
HCT: 44.5 % (ref 39.0–52.0)
Hemoglobin: 14.5 g/dL (ref 13.0–17.0)
MCH: 28.2 pg (ref 26.0–34.0)
MCHC: 32.6 g/dL (ref 30.0–36.0)
MCV: 86.6 fL (ref 80.0–100.0)
Platelets: 273 10*3/uL (ref 150–400)
RBC: 5.14 MIL/uL (ref 4.22–5.81)
RDW: 14.3 % (ref 11.5–15.5)
WBC: 14.3 10*3/uL — ABNORMAL HIGH (ref 4.0–10.5)
nRBC: 0 % (ref 0.0–0.2)

## 2023-09-20 LAB — SAMPLE TO BLOOD BANK

## 2023-09-20 LAB — URINE DRUG SCREEN, QUALITATIVE (ARMC ONLY)
Amphetamines, Ur Screen: NOT DETECTED
Barbiturates, Ur Screen: NOT DETECTED
Benzodiazepine, Ur Scrn: NOT DETECTED
Cannabinoid 50 Ng, Ur ~~LOC~~: POSITIVE — AB
Cocaine Metabolite,Ur ~~LOC~~: NOT DETECTED
MDMA (Ecstasy)Ur Screen: NOT DETECTED
Methadone Scn, Ur: NOT DETECTED
Opiate, Ur Screen: POSITIVE — AB
Phencyclidine (PCP) Ur S: NOT DETECTED
Tricyclic, Ur Screen: POSITIVE — AB

## 2023-09-20 LAB — COMPREHENSIVE METABOLIC PANEL
ALT: 25 U/L (ref 0–44)
AST: 24 U/L (ref 15–41)
Albumin: 4.4 g/dL (ref 3.5–5.0)
Alkaline Phosphatase: 56 U/L (ref 38–126)
Anion gap: 11 (ref 5–15)
BUN: 25 mg/dL — ABNORMAL HIGH (ref 8–23)
CO2: 24 mmol/L (ref 22–32)
Calcium: 9.9 mg/dL (ref 8.9–10.3)
Chloride: 101 mmol/L (ref 98–111)
Creatinine, Ser: 1.2 mg/dL (ref 0.61–1.24)
GFR, Estimated: >60 mL/min (ref >60)
Glucose, Bld: 208 mg/dL — ABNORMAL HIGH (ref 70–99)
Potassium: 5.5 mmol/L — ABNORMAL HIGH (ref 3.5–5.1)
Sodium: 136 mmol/L (ref 135–145)
Total Bilirubin: 0.9 mg/dL (ref <1.2)
Total Protein: 8.2 g/dL — ABNORMAL HIGH (ref 6.5–8.1)

## 2023-09-20 LAB — LACTIC ACID, PLASMA
Lactic Acid, Venous: 2.1 mmol/L (ref 0.5–1.9)
Lactic Acid, Venous: 1.9 mmol/L (ref 0.5–1.9)

## 2023-09-20 LAB — TROPONIN I (HIGH SENSITIVITY): Troponin I (High Sensitivity): 6 ng/L (ref <18)

## 2023-09-20 MED ORDER — DULOXETINE HCL 60 MG PO CPEP
60.0000 mg | ORAL_CAPSULE | Freq: Every day | ORAL | Status: DC
  Administered 2023-09-20: 60 mg via ORAL
  Filled 2023-09-20: qty 1

## 2023-09-20 MED ORDER — DIPHENHYDRAMINE HCL 25 MG PO CAPS: 50.0000 mg | ORAL_CAPSULE | Freq: Four times a day (QID) | ORAL | Status: DC | PRN

## 2023-09-20 MED ORDER — SODIUM CHLORIDE 0.9 % IV SOLN: INTRAVENOUS | Status: DC

## 2023-09-20 MED ORDER — HYDROMORPHONE HCL 1 MG/ML IJ SOLN
1.0000 mg | Freq: Once | INTRAMUSCULAR | Status: AC
  Administered 2023-09-20: 1 mg via INTRAVENOUS
  Filled 2023-09-20: qty 1

## 2023-09-20 MED ORDER — ONDANSETRON HCL 4 MG/2ML IJ SOLN
4.0000 mg | Freq: Once | INTRAMUSCULAR | Status: AC
  Administered 2023-09-20: 4 mg via INTRAVENOUS
  Filled 2023-09-20: qty 2

## 2023-09-20 MED ORDER — HYDROMORPHONE HCL 1 MG/ML IJ SOLN
0.5000 mg | Freq: Once | INTRAMUSCULAR | Status: AC
  Administered 2023-09-20: 0.5 mg via INTRAVENOUS
  Filled 2023-09-20: qty 0.5

## 2023-09-20 MED ORDER — EPINEPHRINE 0.3 MG/0.3ML IJ SOAJ
0.3000 mg | INTRAMUSCULAR | Status: DC | PRN
Start: 2023-09-20 — End: 2023-09-21

## 2023-09-20 MED ORDER — OXYCODONE HCL 5 MG PO TABS
15.0000 mg | ORAL_TABLET | ORAL | Status: DC | PRN
  Administered 2023-09-20 – 2023-09-21 (×2): 15 mg via ORAL
  Filled 2023-09-20 (×2): qty 3

## 2023-09-20 MED ORDER — SUCRALFATE 1 G PO TABS
1.0000 g | ORAL_TABLET | Freq: Three times a day (TID) | ORAL | Status: DC
  Administered 2023-09-20 – 2023-09-21 (×2): 1 g via ORAL
  Filled 2023-09-20 (×2): qty 1

## 2023-09-20 MED ORDER — ONDANSETRON HCL 4 MG/2ML IJ SOLN: 4.0000 mg | Freq: Four times a day (QID) | INTRAMUSCULAR | Status: DC | PRN

## 2023-09-20 MED ORDER — PANTOPRAZOLE SODIUM 40 MG PO TBEC
40.0000 mg | DELAYED_RELEASE_TABLET | Freq: Two times a day (BID) | ORAL | Status: DC
  Administered 2023-09-20 – 2023-09-21 (×2): 40 mg via ORAL
  Filled 2023-09-20 (×2): qty 1

## 2023-09-20 MED ORDER — MAGNESIUM HYDROXIDE 400 MG/5ML PO SUSP: 30.0000 mL | Freq: Every day | ORAL | Status: DC | PRN

## 2023-09-20 MED ORDER — SODIUM ZIRCONIUM CYCLOSILICATE 10 G PO PACK
10.0000 g | PACK | Freq: Once | ORAL | Status: AC
  Administered 2023-09-20: 10 g via ORAL
  Filled 2023-09-20: qty 1

## 2023-09-20 MED ORDER — DAPAGLIFLOZIN PROPANEDIOL 10 MG PO TABS
10.0000 mg | ORAL_TABLET | Freq: Every day | ORAL | Status: DC
  Administered 2023-09-21: 10 mg via ORAL
  Filled 2023-09-20: qty 1

## 2023-09-20 MED ORDER — TRAZODONE HCL 100 MG PO TABS
100.0000 mg | ORAL_TABLET | Freq: Every evening | ORAL | Status: DC | PRN
  Administered 2023-09-20: 100 mg via ORAL
  Filled 2023-09-20: qty 1

## 2023-09-20 MED ORDER — ACETAMINOPHEN 650 MG RE SUPP: 650.0000 mg | Freq: Four times a day (QID) | RECTAL | Status: DC | PRN

## 2023-09-20 MED ORDER — PREGABALIN 50 MG PO CAPS
75.0000 mg | ORAL_CAPSULE | Freq: Two times a day (BID) | ORAL | Status: DC
  Administered 2023-09-20 – 2023-09-21 (×2): 75 mg via ORAL
  Filled 2023-09-20 (×2): qty 1

## 2023-09-20 MED ORDER — ONDANSETRON HCL 4 MG PO TABS: 4.0000 mg | ORAL_TABLET | Freq: Four times a day (QID) | ORAL | Status: DC | PRN

## 2023-09-20 MED ORDER — DOXEPIN HCL 50 MG PO CAPS
50.0000 mg | ORAL_CAPSULE | Freq: Every day | ORAL | Status: DC
  Administered 2023-09-20: 50 mg via ORAL
  Filled 2023-09-20: qty 1

## 2023-09-20 MED ORDER — BENAZEPRIL HCL 10 MG PO TABS: 10.0000 mg | ORAL_TABLET | Freq: Every day | ORAL | Status: DC

## 2023-09-20 MED ORDER — NALOXONE HCL 4 MG/0.1ML NA LIQD
0.4000 mg | NASAL | Status: DC | PRN
Start: 2023-09-20 — End: 2023-09-21

## 2023-09-20 MED ORDER — NITROGLYCERIN 0.4 MG SL SUBL: 0.4000 mg | SUBLINGUAL_TABLET | SUBLINGUAL | Status: DC | PRN

## 2023-09-20 MED ORDER — BUDESON-GLYCOPYRROL-FORMOTEROL 160-9-4.8 MCG/ACT IN AERO: 2.0000 | INHALATION_SPRAY | Freq: Two times a day (BID) | RESPIRATORY_TRACT | Status: DC

## 2023-09-20 MED ORDER — LABETALOL HCL 5 MG/ML IV SOLN: 20.0000 mg | INTRAVENOUS | Status: DC | PRN

## 2023-09-20 MED ORDER — CYANOCOBALAMIN 500 MCG PO TABS
1000.0000 ug | ORAL_TABLET | Freq: Every day | ORAL | Status: DC
  Administered 2023-09-21: 1000 ug via ORAL
  Filled 2023-09-20: qty 2

## 2023-09-20 MED ORDER — EZETIMIBE 10 MG PO TABS
10.0000 mg | ORAL_TABLET | Freq: Every day | ORAL | Status: DC
  Administered 2023-09-21: 10 mg via ORAL
  Filled 2023-09-20: qty 1

## 2023-09-20 MED ORDER — ALBUTEROL SULFATE (2.5 MG/3ML) 0.083% IN NEBU: 2.5000 mg | INHALATION_SOLUTION | Freq: Four times a day (QID) | RESPIRATORY_TRACT | Status: DC | PRN

## 2023-09-20 MED ORDER — CYCLOBENZAPRINE HCL 10 MG PO TABS: 10.0000 mg | ORAL_TABLET | Freq: Three times a day (TID) | ORAL | Status: DC | PRN

## 2023-09-20 MED ORDER — HYDROMORPHONE HCL 1 MG/ML IJ SOLN
1.0000 mg | INTRAMUSCULAR | Status: DC | PRN
  Administered 2023-09-21: 1 mg via INTRAVENOUS
  Filled 2023-09-20: qty 1

## 2023-09-20 MED ORDER — DICYCLOMINE HCL 10 MG PO CAPS
10.0000 mg | ORAL_CAPSULE | Freq: Three times a day (TID) | ORAL | Status: DC | PRN
  Administered 2023-09-21: 10 mg via ORAL
  Filled 2023-09-20: qty 1

## 2023-09-20 MED ORDER — ATORVASTATIN CALCIUM 20 MG PO TABS
80.0000 mg | ORAL_TABLET | Freq: Every day | ORAL | Status: DC
  Administered 2023-09-21: 80 mg via ORAL
  Filled 2023-09-20: qty 4

## 2023-09-20 MED ORDER — ENOXAPARIN SODIUM 60 MG/0.6ML IJ SOSY
0.5000 mg/kg | PREFILLED_SYRINGE | INTRAMUSCULAR | Status: DC
  Administered 2023-09-20: 45 mg via SUBCUTANEOUS
  Filled 2023-09-20: qty 0.6

## 2023-09-20 MED ORDER — FAMOTIDINE 20 MG PO TABS
20.0000 mg | ORAL_TABLET | Freq: Two times a day (BID) | ORAL | Status: DC
  Administered 2023-09-20 – 2023-09-21 (×2): 20 mg via ORAL
  Filled 2023-09-20 (×2): qty 1

## 2023-09-20 MED ORDER — PLECANATIDE 3 MG PO TABS: 1.0000 | ORAL_TABLET | Freq: Every day | ORAL | Status: DC

## 2023-09-20 MED ORDER — SODIUM CHLORIDE 0.9 % IV BOLUS
500.0000 mL | Freq: Once | INTRAVENOUS | Status: AC
  Administered 2023-09-20: 500 mL via INTRAVENOUS

## 2023-09-20 MED ORDER — IOHEXOL 300 MG/ML  SOLN
100.0000 mL | Freq: Once | INTRAMUSCULAR | Status: AC | PRN
  Administered 2023-09-20: 100 mL via INTRAVENOUS

## 2023-09-20 MED ORDER — ISOSORBIDE MONONITRATE ER 60 MG PO TB24
60.0000 mg | ORAL_TABLET | Freq: Every day | ORAL | Status: DC
  Administered 2023-09-21: 60 mg via ORAL
  Filled 2023-09-20: qty 1

## 2023-09-20 MED ORDER — ACETAMINOPHEN 325 MG PO TABS: 650.0000 mg | ORAL_TABLET | Freq: Four times a day (QID) | ORAL | Status: DC | PRN

## 2023-09-20 MED ORDER — KETOROLAC TROMETHAMINE 15 MG/ML IJ SOLN: 15.0000 mg | Freq: Four times a day (QID) | INTRAMUSCULAR | Status: DC | PRN

## 2023-09-20 NOTE — ED Notes (Addendum)
Intentional rounding done on patient. Stretcher locked in lowest position and patient resting in stretcher comfortably, playing on phone. Patient reminded to keep arm straight so he could finish his IV bolus.

## 2023-09-20 NOTE — Assessment & Plan Note (Signed)
-   We will hold off benazepril and given 1 dose of p.o. Lokelma.

## 2023-09-20 NOTE — Assessment & Plan Note (Addendum)
-   We will continue statin therapy and Zetia as well as Lovaza.

## 2023-09-20 NOTE — Assessment & Plan Note (Signed)
-   The patient will be placed on supplemental coverage with NovoLog. - We will continue basal coverage. - We will continue [place].

## 2023-09-20 NOTE — ED Notes (Signed)
Pt went out on staff and had syncopal episode. Charge called for room. Pt had another episode with Bri RN witnessing. Pt taken in wheelchair to [address] and in route had another syncopal episode.  Pt moved over to stretcher by staff.  Pt tearful and crying with MD at bedside when assessing belly.   Pt also states black tarry stools.

## 2023-09-20 NOTE — Assessment & Plan Note (Addendum)
-   This is of unclear etiology. - His abdominal and pelvic CT scan came back negative. - It could be related to IBS. - The patient be admitted to the medical telemetry observation bed. - Pain management will be provided. - Pain management will be provided and will add as needed p.o. Bentyl. - Despite reported subjective melena he was stool Hemoccult negative per a rectal exam by ED physician.

## 2023-09-20 NOTE — ED Triage Notes (Signed)
Pt to ED for abd pain and worms in stool. States stools are tarry and sees worms. States has been seen for abd pain multiple times and has not had relief. +emesis

## 2023-09-20 NOTE — H&P (Signed)
[place]   PATIENT NAME: Edward Schneider    MR#:  [id number]  DATE OF BIRTH:  [birth year]  DATE OF ADMISSION:  09/20/2023  PRIMARY CARE PHYSICIAN: System, Provider Not In   Patient is coming from: Home  REQUESTING/REFERRING PHYSICIAN: Miki Kins, MD  CHIEF COMPLAINT:   Chief Complaint  Patient presents with   Abdominal Pain    HISTORY OF PRESENT ILLNESS:  Edward Gift. is a 67 y.o. male with medical history significant for asthma, DDD, grade 1 diastolic function, pancreatitis, GERD, hypertension, dyslipidemia, seizure disorder, type 2 diabetes mellitus, OSA on CPAP, cervical and lumbar DDD, coronary artery disease as s/p PCI and stent and C. difficile colitis,, who presented to the ER with acute onset of diffuse abdominal pain over the last few days which has been significantly worse today when he tried to eat earlier this afternoon.  He had nausea and vomiting X4 today.  He reported dark tarry stools with diarrhea which has been going on over the last several months and stated that he sometimes feels like there are some worms in his stool.  In the ED triage area he had a presyncope twice.  He was seen last month in the ED for similar abdominal pain.  He denies any bright red bleeding per rectum.  No dysuria, oliguria or hematuria or flank pain.  He follows with endocrinology for his diabetes and his last visit was on 11/4.  No fever or chills.  No cough or wheezing or dyspnea.  No chest pain or palpitations.  ED Course: When he into the ER vital signs revealed a heart rate of 115 with otherwise normal vital signs.  Labs revealed potassium of 5.5 with a glucose of 2 8 and BUN 25 with otherwise normal vital signs.  Total protein was 8.2 high sensitive troponin I was 6.  Lactic acid was 2.1 and later 1.9.  CBC showed leukocytosis 14.3..  Urinalysis showed specific gravity 1043.  Urine drug screen came back positive for cannabinoids, opiates and tricyclic's. EKG as reviewed  by me : None. Imaging: Abdominal pelvic CT scan revealed aortic atherosclerosis with no acute intra-abdominal or pelvic pathology.  The patient was given a total of 2.5 mg of IV Dilaudid, 4 mg of IV Zofran and 500 mL IV normal saline bolus.  He will be admitted to a medical telemetry observation bed for further evaluation and management. PAST MEDICAL HISTORY:   Past Medical History:  Diagnosis Date   Allergy    Aortic atherosclerosis (HCC)    Asthma    C. difficile diarrhea    Chronic pain    Collagenous colitis    Coronary artery disease    a.) PCI with 2.75 x 18 mm Resolute Onyx DES x 1 to prox/mid LAD on 09/05/2019   DDD (degenerative disc disease), cervical    DDD (degenerative disc disease), lumbar    GERD (gastroesophageal reflux disease)    Grade I diastolic dysfunction    Hepatic steatosis    Hyperlipidemia    Hypertension    Liver cancer (HCC) 03/2015   Migraines    Myocardial infarction (HCC)     OSA on CPAP    Seizures (HCC)    several as child when sick.  None since age 81   Stroke [facility])    'mini-stroke" 30 yrs ago. no deficits.   T2DM (type 2 diabetes mellitus) (HCC)    Wears dentures    full upper and lower  PAST SURGICAL HISTORY:   Past Surgical History:  Procedure Laterality Date   APPENDECTOMY     BACK SURGERY     CARDIAC CATHETERIZATION     No stent placed in his "5's"   CERVICAL FUSION     COLONOSCOPY WITH PROPOFOL N/A 03/06/2016   Procedure: COLONOSCOPY WITH PROPOFOL;  Surgeon: Midge Minium, MD;  Location: [facility];  Service: Endoscopy;  Laterality: N/A;  requests early   COLONOSCOPY WITH PROPOFOL N/A 06/18/2020   Procedure: COLONOSCOPY WITH PROPOFOL;  Surgeon: Midge Minium, MD;  Location: [facility] ENDOSCOPY;  Service: Endoscopy;  Laterality: N/A;   CORONARY PRESSURE/FFR STUDY N/A 09/05/2019   Procedure: INTRAVASCULAR PRESSURE WIRE/FFR STUDY;  Surgeon: Yvonne Kendall, MD;  Location: [facility] INVASIVE CV LAB;  Service: Cardiovascular;   Laterality: N/A;   ESOPHAGOGASTRODUODENOSCOPY (EGD) WITH PROPOFOL N/A 09/20/2017   Procedure: ESOPHAGOGASTRODUODENOSCOPY (EGD) WITH PROPOFOL;  Surgeon: Midge Minium, MD;  Location: [facility];  Service: Endoscopy;  Laterality: N/A;  Diabetic - oral meds   FINGER SURGERY Left    KNEE SURGERY Right    LEFT HEART CATH AND CORONARY ANGIOGRAPHY Left 09/05/2019   Procedure: LEFT HEART CATH AND CORONARY ANGIOGRAPHY (2.75 x 18 mm Resolute Onyx DES x 1 to prox/mid LAD);  Surgeon: Yvonne Kendall, MD;  Location: [facility] INVASIVE CV LAB;  Service: Cardiovascular;  Laterality: Left;   NECK SURGERY     spleen surgery     TOE SURGERY Right    TOTAL HIP ARTHROPLASTY Right 06/03/2021   Procedure: TOTAL HIP ARTHROPLASTY ANTERIOR APPROACH;  Surgeon: Kennedy Bucker, MD;  Location: [facility] ORS;  Service: Orthopedics;  Laterality: Right;   TOTAL KNEE ARTHROPLASTY Right 08/22/2021   Procedure: TOTAL KNEE ARTHROPLASTY;  Surgeon: Kennedy Bucker, MD;  Location: [facility] ORS;  Service: Orthopedics;  Laterality: Right;    SOCIAL HISTORY:   Social History   Tobacco Use   Smoking status: Former    Current packs/day: 0.00    Average packs/day: 2.0 packs/day for 50.0 years (100.0 ttl pk-yrs)    Types: Cigarettes    Start date: 68    Quit date: 2011    Years since quitting: 13.9   Smokeless tobacco: Never  Substance Use Topics   Alcohol use: No    Alcohol/week: 0.0 standard drinks of alcohol    FAMILY HISTORY:   Family History  Problem Relation Age of Onset   Arthritis Mother    Diabetes Mother    Kidney disease Mother    Heart disease Mother    Hypertension Mother    Arthritis Father    Hearing loss Father    Hypertension Father    Heart disease Father    Diabetes Sister    Heart disease Sister    Diabetes Daughter    Diabetes Maternal Aunt    Diabetes Maternal Grandmother    Heart Problems Brother    Heart Problems Brother    Heart Problems Brother    Heart attack Maternal Grandfather     Colon cancer Paternal Grandfather     DRUG ALLERGIES:   Allergies  Allergen Reactions   Bee Venom Anaphylaxis   Crestor [Rosuvastatin Calcium] Shortness Of Breath and Swelling   Fentanyl Itching and Hives    blisters Patch   Gabapentin Diarrhea    Severe diarrhea which caused incontinence, loss of appetite and weight loss.   Shellfish Allergy Anaphylaxis and Swelling    Shrimp causes throat to swell and tingling in tongue.    Fire [employer]    Furosemide Nausea  And Vomiting   Buprenorphine Hcl Itching   Chlorhexidine Gluconate Itching and Rash   Simvastatin Diarrhea    REVIEW OF SYSTEMS:   ROS As per history of present illness. All pertinent systems were reviewed above. Constitutional, HEENT, cardiovascular, respiratory, GI, GU, musculoskeletal, neuro, psychiatric, endocrine, integumentary and hematologic systems were reviewed and are otherwise negative/unremarkable except for positive findings mentioned above in the HPI.   MEDICATIONS AT HOME:   Prior to Admission medications   Medication Sig Start Date End Date Taking? Authorizing Provider  Accu-Chek Softclix Lancets lancets Check blood sugar 2 times daily 04/06/23   Shamleffer, Konrad Dolores, MD  acetaminophen (TYLENOL) 650 MG CR tablet Take 1,300 mg by mouth every 8 (eight) hours.    [provider]  albuterol (VENTOLIN HFA) 108 (90 Base) MCG/ACT inhaler INHALE 2 PUFFS INTO THE LUNGS EVERY 6 HOURS AS NEEDED FOR WHEEZING OR SHORTNESS OF BREATH 11/05/22   Tomma Lightning, MD  Alcohol Swabs (PHARMACIST CHOICE ALCOHOL) PADS SMARTSIG:1 Each Topical 4 Times Daily 09/18/21   [provider]  Ascorbic Acid (VITAMIN C) 1000 MG tablet Take 1,000 mg by mouth daily.     [provider]  aspirin EC 81 MG tablet Take 81 mg by mouth daily. Swallow whole.    [provider]  atorvastatin (LIPITOR) 80 MG tablet TAKE 1 TABLET(80 MG) BY MOUTH AT BEDTIME 05/07/23   Cannady, Jolene T, NP  benazepril (LOTENSIN) 10  MG tablet Take 1 tablet (10 mg total) by mouth daily. 07/08/22   Cannady, Corrie Dandy T, NP  bisacodyl (DULCOLAX) 5 MG EC tablet Take 2 tablets (10 mg total) by mouth daily in the afternoon. 03/06/22   Enedina Finner, MD  Budeson-Glycopyrrol-Formoterol (BREZTRI AEROSPHERE) 160-9-4.8 MCG/ACT AERO Inhale 2 puffs into the lungs in the morning and at bedtime. 07/22/22   Olalere, Onnie Boer A, MD  calcium carbonate (OS-CAL) 600 MG TABS tablet Take 600 mg by mouth daily.     [provider]  cetirizine (ZYRTEC) 10 MG tablet Take 1 tablet (10 mg total) by mouth daily. 04/17/22   Cannady, Corrie Dandy T, NP  Cholecalciferol (VITAMIN D3) 25 MCG (1000 UT) CHEW Chew 1,000 Units by mouth daily.     [provider]  CINNAMON PO Take 1,000 mg by mouth 2 (two) times daily.     [provider]  clobetasol cream (TEMOVATE) 0.05 % Apply 1 application topically 2 (two) times daily. 07/17/20   Gabriel Cirri, NP  COMFORT EZ PEN NEEDLES 32G X 4 MM MISC Use to inject insulin 5 times daily 01/18/23   Shamleffer, Konrad Dolores, MD  Continuous Glucose Sensor (FREESTYLE LIBRE 3 PLUS SENSOR) MISC 1 Device by Other route every 14 (fourteen) days. Change sensor every 15 days. 08/23/23   Shamleffer, Konrad Dolores, MD  Continuous Glucose Transmitter (DEXCOM G6 TRANSMITTER) MISC 1 Device by Does not apply route as directed. Patient not taking: Reported on 08/23/2023 02/16/23   Shamleffer, Konrad Dolores, MD  cyclobenzaprine (FLEXERIL) 10 MG tablet Take 1 tablet (10 mg total) by mouth 3 (three) times daily as needed for muscle spasms. 01/07/18   Midge Minium, MD  cyclobenzaprine (FLEXERIL) 5 MG tablet 1 tablet at bedtime as needed Orally Once a day for 90 days muscle spasms 08/02/23 01/29/24  [provider]  dapagliflozin propanediol (FARXIGA) 10 MG TABS tablet Take 1 tablet (10 mg total) by mouth daily before breakfast. 04/17/22   Cannady, Corrie Dandy T, NP  diclofenac Sodium (VOLTAREN) 1 % GEL Apply 2 g topically  4 (four)  times daily as needed (pain).  07/10/20   [provider]  diphenhydrAMINE (BENADRYL) 25 mg capsule Take 50 mg by mouth in the morning, at noon, and at bedtime.    [provider]  doxepin (SINEQUAN) 25 MG capsule Take 2 capsules (50 mg total) by mouth at bedtime. 07/08/22   Cannady, Corrie Dandy T, NP  DULoxetine (CYMBALTA) 60 MG capsule TAKE 1 CAPSULE(60 MG) BY MOUTH AT BEDTIME 06/17/23   Cannady, Jolene T, NP  EPINEPHrine 0.3 mg/0.3 mL IJ SOAJ injection Inject 0.3 mg into the muscle as needed for anaphylaxis. 12/12/21   Olevia Perches P, DO  ezetimibe (ZETIA) 10 MG tablet Take 1 tablet (10 mg total) by mouth daily. 04/17/22   Cannady, Corrie Dandy T, NP  famotidine (PEPCID) 20 MG tablet TAKE 1 TABLET BY MOUTH TWICE DAILY 05/25/23   Cannady, Corrie Dandy T, NP  Ferrous Gluconate-C-Folic Acid (IRON-C PO) Take 30 mg by mouth daily.    [provider]  Ginger, Zingiber officinalis, (GINGER PO) Take 1 tablet by mouth daily.    [provider]  Ginseng 100 MG CAPS Take 100 mg by mouth daily.     [provider]  glucose blood (ACCU-CHEK GUIDE) test strip Check blood sugar 2 times daily 04/06/23   Shamleffer, Konrad Dolores, MD  insulin lispro (HUMALOG) 100 UNIT/ML injection Max daily 100 units 12/18/22   Shamleffer, Konrad Dolores, MD  isosorbide mononitrate (IMDUR) 60 MG 24 hr tablet Take 1 tablet (60 mg total) by mouth daily. 11/03/22   Cannady, Corrie Dandy T, NP  lidocaine (XYLOCAINE) 2 % solution Use as directed 15 mLs in the mouth or throat as needed for mouth pain. 12/12/21   Johnson, Megan P, DO  Minoxidil 5 % FOAM Apply 1 g topically daily. 11/23/22   [provider]  mometasone (ELOCON) 0.1 % ointment Apply topically 2 (two) times daily as needed. 12/23/21   [provider]  Multiple Vitamins-Minerals (CENTRUM SILVER 50+MEN) TABS Take 1 tablet by mouth daily.    [provider]  mupirocin ointment (BACTROBAN) 2 % Apply 1 Application topically 2 (two) times  daily. 07/02/22   Cannady, Corrie Dandy T, NP  naproxen sodium (ALEVE) 220 MG tablet Take 440 mg by mouth daily as needed (body aches).    [provider]  NARCAN 4 MG/0.1ML LIQD nasal spray kit Place 0.4 mg into the nose as needed (opioid overdose).  02/20/19   [provider]  nitroGLYCERIN (NITROSTAT) 0.4 MG SL tablet Place 0.4 mg under the tongue every 5 (five) minutes as needed for chest pain.  08/30/19   [provider]  Omega-3 1000 MG CAPS Take 1,000 mg by mouth daily.     [provider]  ondansetron (ZOFRAN) 4 MG tablet TAKE 1 TABLET(4 MG) BY MOUTH EVERY 8 HOURS AS NEEDED FOR NAUSEA OR VOMITING 01/22/23   Cannady, Jolene T, NP  ondansetron (ZOFRAN-ODT) 4 MG disintegrating tablet Take 2 mg by mouth every 6 (six) hours as needed. 07/26/23   [provider]  oxyCODONE (ROXICODONE) 15 MG immediate release tablet Take 15 mg by mouth every 4 (four) hours as needed. :1 Tablet(s) By Mouth Every 4-6 Hours 01/09/23   [provider]  OXYCONTIN 10 MG 12 hr tablet Take 10 mg by mouth every 12 (twelve) hours. 01/05/23   [provider]  pantoprazole (PROTONIX) 40 MG tablet TAKE 1 TABLET(40 MG) BY MOUTH TWICE DAILY 06/01/22   Cannady, Corrie Dandy T, NP  Plecanatide (TRULANCE) 3 MG  TABS Take 1 tablet by mouth daily. 02/16/22   Midge Minium, MD  polyethylene glycol (MIRALAX / GLYCOLAX) 17 g packet Take 17 g by mouth 2 (two) times daily. 03/18/22   Cannady, Corrie Dandy T, NP  pregabalin (LYRICA) 75 MG capsule Take 75 mg by mouth 2 (two) times daily. 12/25/22   [provider]  senna-docusate (SENOKOT-S) 8.6-50 MG tablet Take 1 tablet by mouth 2 (two) times daily. 03/18/22   Cannady, Jolene T, NP  sucralfate (CARAFATE) 1 g tablet TAKE 1 TABLET BY MOUTH FOUR TIMES DAILY AT BEDTIME WITH MEALS 01/22/23   Cannady, Corrie Dandy T, NP  traZODone (DESYREL) 100 MG tablet Take 1 tablet (100 mg total) by mouth at bedtime as needed. for sleep 04/17/22   Aura Dials T, NP  vitamin A  10000 UNIT capsule Take 10,000 Units by mouth daily.    [provider]  vitamin B-12 (CYANOCOBALAMIN) 1000 MCG tablet Take 1,000 mcg by mouth daily.    [provider]  Vitamin E 400 units TABS Take 400 Units by mouth daily.     [provider]      VITAL SIGNS:  Blood pressure (!) 141/95, pulse 93, temperature 97.8 F (36.6 C), resp. rate 18, height 5\' 8"  (1.727 m), weight 89.8 kg, SpO2 96%.  PHYSICAL EXAMINATION:  Physical Exam  GENERAL:  67 y.o.-year-old Caucasian male patient lying in the bed with no acute distress.  EYES: Pupils equal, round, reactive to light and accommodation. No scleral icterus. Extraocular muscles intact.  HEENT: Head atraumatic, normocephalic. Oropharynx and nasopharynx clear.  NECK:  Supple, no jugular venous distention. No thyroid enlargement, no tenderness.  LUNGS: Normal breath sounds bilaterally, no wheezing, rales,rhonchi or crepitation. No use of accessory muscles of respiration.  CARDIOVASCULAR: Regular rate and rhythm, S1, S2 normal. No murmurs, rubs, or gallops.  ABDOMEN: Soft, nondistended, nontender. Bowel sounds present. No organomegaly or mass.  EXTREMITIES: No pedal edema, cyanosis, or clubbing.  NEUROLOGIC: Cranial nerves II through XII are intact. Muscle strength 5/5 in all extremities. Sensation intact. Gait not checked.  PSYCHIATRIC: The patient is alert and oriented x 3.  Normal affect and good eye contact. SKIN: No obvious rash, lesion, or ulcer.   LABORATORY PANEL:   CBC Recent Labs  Lab 09/20/23 1614  WBC 14.3*  HGB 14.5  HCT 44.5  PLT 273   ------------------------------------------------------------------------------------------------------------------  Chemistries  Recent Labs  Lab 09/20/23 1614  NA 136  K 5.5*  CL 101  CO2 24  GLUCOSE 208*  BUN 25*  CREATININE 1.20  CALCIUM 9.9  AST 24  ALT 25  ALKPHOS 56  BILITOT 0.9    ------------------------------------------------------------------------------------------------------------------  Cardiac Enzymes No results for input(s): "TROPONINI" in the last 168 hours. ------------------------------------------------------------------------------------------------------------------  RADIOLOGY:  CT ABDOMEN PELVIS W CONTRAST  Result Date: 09/20/2023 CLINICAL DATA:  Abdominal pain. EXAM: CT ABDOMEN AND PELVIS WITH CONTRAST TECHNIQUE: Multidetector CT imaging of the abdomen and pelvis was performed using the standard protocol following bolus administration of intravenous contrast. RADIATION DOSE REDUCTION: This exam was performed according to the departmental dose-optimization program which includes automated exposure control, adjustment of the mA and/or kV according to patient size and/or use of iterative reconstruction technique. CONTRAST:  OMNIPAQUE IOHEXOL 300 MG/ML  SOLN COMPARISON:  CT abdomen pelvis dated 09/14/2023. FINDINGS: Lower chest: The visualized lung bases are clear. No intra-abdominal free air or free fluid. Hepatobiliary: The liver is unremarkable. No biliary dilatation. The gallbladder is unremarkable. Pancreas: Unremarkable. No pancreatic ductal dilatation or surrounding  inflammatory changes. Spleen: Normal in size without focal abnormality. Adrenals/Urinary Tract: The adrenal glands are unremarkable. There is no hydronephrosis on either side. There is symmetric enhancement and excretion of contrast by both kidneys. The visualized ureters and urinary bladder probable. Stomach/Bowel: There is no bowel obstruction or active inflammation. Appendectomy. Vascular/Lymphatic: Mild aortoiliac atherosclerotic disease. The IVC is unremarkable. No portal venous gas. There is no adenopathy. Reproductive: The prostate and seminal vesicles are grossly unremarkable. No pelvic mass. Other: None Musculoskeletal: Lower lumbar laminectomy and fusion. Total right hip  arthroplasty. No acute osseous pathology. IMPRESSION: 1. No acute intra-abdominal or pelvic pathology. 2.  Aortic Atherosclerosis (ICD10-I70.0). Electronically Signed   By: Elgie Collard M.D.   On: 09/20/2023 21:26      IMPRESSION AND PLAN:  Assessment and Plan: * Abdominal pain - This is of unclear etiology. - His abdominal and pelvic CT scan came back negative. - It could be related to IBS. - The patient be admitted to the medical telemetry observation bed. - Pain management will be provided. - Pain management will be provided and will add as needed p.o. Bentyl. - Despite reported subjective melena he was stool Hemoccult negative per a rectal exam by ED physician.  Near syncope - This is of unclear etiology as well. - It could be neurally mediated due to pain. - Will check orthostatics q 12 hours. - The patient will be gently hydrated with IV normal saline and monitored for arrhythmias. -Differential diagnoses would include neurally mediated syncope, cardiogenic, arrhythmias related,  orthostatic hypotension and less likely hypoglycemia.    Hyperkalemia - We will hold off benazepril and given 1 dose of p.o. Lokelma.  Essential hypertension - We will hold off benazepril for now given his hyperkalemia. - We will place on as needed IV labetalol.   Type 2 diabetes mellitus without complications (HCC) - The patient will be placed on supplemental coverage with NovoLog. - We will continue basal coverage. - We will continue [place].  Dyslipidemia - We will continue statin therapy and Zetia as well as Lovaza.  Coronary artery disease - We will continue during parents holding nitroglycerin as well as  ACE inhibitor therapy  GERD without esophagitis - We will continue therapy as well as Carafate.   DVT prophylaxis: Lovenox.  Advanced Care Planning:  Code Status: full code.  Family Communication:  The plan of care was discussed in details with the patient (and family). I  answered all questions. The patient agreed to proceed with the above mentioned plan. Further management will depend upon hospital course. Disposition Plan: Back to previous home environment Consults called: none.  All the records are reviewed and case discussed with ED provider.  Status is: Observation  I certify that at the time of admission, it is my clinical judgment that the patient will require  hospital care extending less than 2 midnights.                            Dispo: The patient is from: Home              Anticipated d/c is to: Home              Patient currently is not medically stable to d/c.              Difficult to place patient: No  Hannah Beat M.D on 09/20/2023 at 10:49 PM  [facility]   From 7 PM-7 AM,  contact night-coverage [email or web address]  CC: Primary care physician; System, Provider Not In

## 2023-09-20 NOTE — Assessment & Plan Note (Signed)
-   This is of unclear etiology as well. - It could be neurally mediated due to pain. - Will check orthostatics q 12 hours. - The patient will be gently hydrated with IV normal saline and monitored for arrhythmias. -Differential diagnoses would include neurally mediated syncope, cardiogenic, arrhythmias related,  orthostatic hypotension and less likely hypoglycemia.

## 2023-09-20 NOTE — ED Provider Notes (Signed)
[facility] Provider Note    Event Date/Time   First MD Initiated Contact with Patient 09/20/23 1716     (approximate)   History   Abdominal Pain   HPI  Edward Recendez. is a 67 y.o. male with a history of type 2 diabetes, hypertension, pancreatitis, and C. difficile colitis who presents with abdominal pain and tarry stools.  The patient states that the pain acutely worsened in the last few days and has been very severe today since he tried to eat earlier in the afternoon.  He has vomited 4 times today.  He reports dark tarry stools and diarrhea that has been going on for several months and states he sometimes feels like there may be worms in his stool.  He states that the abdominal pain has also been going on for a few weeks and states it is similar to what he was seen in the ED for late last month.  He denies any blood in the stool.  He has no fever or chills.  He has no urinary symptoms.  He states he feels short of breath due to the pain.  I reviewed the past medical records.  The patient was seen in the ED on 11/26 with chest and abdominal pain.  He had a negative workup at that time including CTA of the chest, abdomen, and pelvis.  Previously he was seen in the ED on 7/23 with diarrhea and was treated empirically for possible C. difficile.  CT abdomen at that time was also negative.  His most recent outpatient counter was on 11/4 with [facility] endocrinology for follow-up of his diabetes.   Physical Exam   Triage Vital Signs: ED Triage Vitals  Encounter Vitals Group     BP 09/20/23 1611 133/87     Systolic BP Percentile --      Diastolic BP Percentile --      Pulse Rate 09/20/23 1611 (!) 115     Resp 09/20/23 1611 20     Temp 09/20/23 1611 97.8 F (36.6 C)     Temp src --      SpO2 09/20/23 1611 97 %     Weight 09/20/23 1612 198 lb (89.8 kg)     Height 09/20/23 1612 5\' 8"  (1.727 m)     Head Circumference --      Peak Flow --      Pain Score  09/20/23 1611 10     Pain Loc --      Pain Education --      Exclude from Growth Chart --     Most recent vital signs: Vitals:   09/20/23 1917 09/20/23 2000  BP: 114/68 (!) 141/95  Pulse: 94 93  Resp:  18  Temp:    SpO2: 97% 96%    General: Alert and oriented, anxious and uncomfortable appearing. CV:  Good peripheral perfusion.  Resp:  Normal effort.  Abd:  Soft with diffuse tenderness.  Mild distention.  Other:  No jaundice or scleral icterus.  Somewhat dry mucous membranes.   ED Results / Procedures / Treatments   Labs (all labs ordered are listed, but only abnormal results are displayed) Labs Reviewed  COMPREHENSIVE METABOLIC PANEL - Abnormal; Notable for the following components:      Result Value   Potassium 5.5 (*)    Glucose, Bld 208 (*)    BUN 25 (*)    Total Protein 8.2 (*)    All other components within normal  limits  CBC - Abnormal; Notable for the following components:   WBC 14.3 (*)    All other components within normal limits  URINALYSIS, ROUTINE W REFLEX MICROSCOPIC - Abnormal; Notable for the following components:   Color, Urine YELLOW (*)    APPearance CLEAR (*)    Specific Gravity, Urine 1.043 (*)    All other components within normal limits  LACTIC ACID, PLASMA - Abnormal; Notable for the following components:   Lactic Acid, Venous 2.1 (*)    All other components within normal limits  URINE DRUG SCREEN, QUALITATIVE ([facility] ONLY) - Abnormal; Notable for the following components:   Tricyclic, Ur Screen POSITIVE (*)    Opiate, Ur Screen POSITIVE (*)    Cannabinoid 50 Ng, Ur [place] POSITIVE (*)    All other components within normal limits  GASTROINTESTINAL PANEL BY PCR, STOOL (REPLACES STOOL CULTURE)  C DIFFICILE QUICK SCREEN W PCR REFLEX    LIPASE, BLOOD  LACTIC ACID, PLASMA  HIV ANTIBODY (ROUTINE TESTING W REFLEX)  BASIC METABOLIC PANEL  CBC  SAMPLE TO BLOOD BANK  TROPONIN I (HIGH SENSITIVITY)     EKG  ED ECG REPORT I, Dionne Bucy,  the attending physician, personally viewed and interpreted this ECG.  Date: 09/20/2023 EKG Time: 1728 Rate: 110 Rhythm: Sinus tachycardia QRS Axis: normal Intervals: normal ST/T Wave abnormalities: normal Narrative Interpretation: no evidence of acute ischemia    RADIOLOGY  CT abdomen/pelvis: I independently viewed and interpreted the images; there are no dilated bowel loops or any free air or free fluid.  Radiology report indicates no acute abnormality.  PROCEDURES:  Critical Care performed: No  Procedures   MEDICATIONS ORDERED IN ED: Medications  oxyCODONE (Oxy IR/ROXICODONE) immediate release tablet 15 mg (has no administration in time range)  atorvastatin (LIPITOR) tablet 80 mg (has no administration in time range)  benazepril (LOTENSIN) tablet 10 mg (has no administration in time range)  EPINEPHrine (EPI-PEN) injection 0.3 mg (has no administration in time range)  ezetimibe (ZETIA) tablet 10 mg (has no administration in time range)  isosorbide mononitrate (IMDUR) 24 hr tablet 60 mg (has no administration in time range)  nitroGLYCERIN (NITROSTAT) SL tablet 0.4 mg (has no administration in time range)  doxepin (SINEQUAN) capsule 50 mg (has no administration in time range)  DULoxetine (CYMBALTA) DR capsule 60 mg (has no administration in time range)  traZODone (DESYREL) tablet 100 mg (has no administration in time range)  dapagliflozin propanediol (FARXIGA) tablet 10 mg (has no administration in time range)  famotidine (PEPCID) tablet 20 mg (has no administration in time range)  pantoprazole (PROTONIX) EC tablet 40 mg (has no administration in time range)  sucralfate (CARAFATE) tablet 1 g (has no administration in time range)  cyanocobalamin (VITAMIN B12) tablet 1,000 mcg (has no administration in time range)  Plecanatide TABS 3 mg (has no administration in time range)  naloxone (NARCAN) nasal spray 4 mg/0.1 mL (has no administration in time range)  cyclobenzaprine  (FLEXERIL) tablet 10 mg (has no administration in time range)  pregabalin (LYRICA) capsule 75 mg (has no administration in time range)  albuterol (VENTOLIN HFA) 108 (90 Base) MCG/ACT inhaler 2 puff (has no administration in time range)  Budeson-Glycopyrrol-Formoterol 160-9-4.8 MCG/ACT AERO 2 puff (has no administration in time range)  diphenhydrAMINE (BENADRYL) capsule 50 mg (has no administration in time range)  enoxaparin (LOVENOX) injection 40 mg (has no administration in time range)  0.9 %  sodium chloride infusion (has no administration in time range)  acetaminophen (TYLENOL)  tablet 650 mg (has no administration in time range)    Or  acetaminophen (TYLENOL) suppository 650 mg (has no administration in time range)  magnesium hydroxide (MILK OF MAGNESIA) suspension 30 mL (has no administration in time range)  ondansetron (ZOFRAN) tablet 4 mg (has no administration in time range)    Or  ondansetron (ZOFRAN) injection 4 mg (has no administration in time range)  sodium chloride 0.9 % bolus 500 mL (0 mLs Intravenous Stopped 09/20/23 2202)  ondansetron (ZOFRAN) injection 4 mg (4 mg Intravenous Given 09/20/23 1724)  HYDROmorphone (DILAUDID) injection 1 mg (1 mg Intravenous Given 09/20/23 1723)  iohexol (OMNIPAQUE) 300 MG/ML solution 100 mL (100 mLs Intravenous Contrast Given 09/20/23 1740)  HYDROmorphone (DILAUDID) injection 0.5 mg (0.5 mg Intravenous Given 09/20/23 2008)  HYDROmorphone (DILAUDID) injection 1 mg (1 mg Intravenous Given 09/20/23 2201)     IMPRESSION / MDM / ASSESSMENT AND PLAN / ED COURSE  I reviewed the triage vital signs and the nursing notes.  67 year old male with PMH as noted above presents with acute on chronic abdominal pain, diarrhea, tarry stools.  Some of the symptoms have been going on for at least a few months although he states that they have been exacerbated over the last week.  In addition to the pain, the patient apparently had a near syncopal event in the waiting  room and was brought back after this.  On exam, the patient is alert and oriented but very anxious and uncomfortable appearing.  The abdomen appears somewhat distended and is diffusely tender.  He is tachycardic with otherwise normal vital signs even after the near syncopal episode.  The patient did not appear to fully lose consciousness.  Differential diagnosis includes, but is not limited to, exacerbation of chronic abdominal pain, colitis, diverticulitis, gastroenteritis, SBO, volvulus, IBD, IBS, pancreatitis, other hepatobiliary cause.  The near syncopal event is most consistent with a vasovagal episode due to acute pain.  We will obtain lab workup including stool studies.  Although the patient has had relatively recent imaging, given the acute pain and tenderness on exam he will need a CT today.  Patient's presentation is most consistent with acute presentation with potential threat to life or bodily function.  The patient is on the cardiac monitor to evaluate for evidence of arrhythmia and/or significant heart rate changes.  ----------------------------------------- 10:33 PM on 09/20/2023 -----------------------------------------  CT is negative for acute findings.  Lab workup is overall reassuring.  CBC shows mild leukocytosis.  LFTs and lipase are normal.  UDS is positive for cannabinoids and tricyclics (opiates are likely due to the Dilaudid he received here).  He has not been able to provide a stool sample.  Potassium is slightly elevated although this may be due to hemolysis.  EKG shows no peaked T waves or other findings.  The patient is continue to have severe abdominal pain when the Dilaudid wears off.  Given the intractable pain as well as the repeated near syncope earlier he will need admission for further management.  I consulted Dr. Arville Care from the hospitalist service; based on our discussion he agrees to evaluate the patient for admission.   FINAL CLINICAL IMPRESSION(S) / ED  DIAGNOSES   Final diagnoses:  Generalized abdominal pain  Near syncope     Rx / DC Orders   ED Discharge Orders     None        Note:  This document was prepared using Dragon voice recognition software and may include unintentional dictation errors.  Dionne Bucy, MD 09/20/23 2235

## 2023-09-20 NOTE — Assessment & Plan Note (Signed)
-   We will continue therapy as well as Carafate.

## 2023-09-20 NOTE — ED Notes (Signed)
Patient complaining of intense abdominal pain at this time. Dr. Marisa Severin notified.

## 2023-09-20 NOTE — Progress Notes (Signed)
Anticoagulation monitoring(Lovenox):  67 yo  male ordered Lovenox 40 mg Q24h    Filed Weights   09/20/23 1612  Weight: 89.8 kg (198 lb)   BMI 30.11   Lab Results  Component Value Date   CREATININE 1.20 09/20/2023   CREATININE 0.91 09/14/2023   CREATININE 1.42 (H) 05/11/2023   Estimated Creatinine Clearance: 65.9 mL/min (by C-G formula based on SCr of 1.2 mg/dL). Hemoglobin & Hematocrit     Component Value Date/Time   HGB 14.5 09/20/2023 1614   HGB 12.6 (L) 03/11/2022 1128   HCT 44.5 09/20/2023 1614   HCT 38.8 03/11/2022 1128     Per Protocol for Patient with estCrcl > 30 ml/min and BMI > 30, will transition to Lovenox 45 mg Q24h.

## 2023-09-20 NOTE — Assessment & Plan Note (Addendum)
-   We will hold off benazepril for now given his hyperkalemia. - We will place on as needed IV labetalol.

## 2023-09-20 NOTE — ED Notes (Signed)
Pt stating he needs oxygen. Pt vitals rechecked and HR 125 and O2 96% RA.  Pt requesting O2 to help him with breathing and his pain. Pt placed on 1L O2 at this time.

## 2023-09-20 NOTE — Assessment & Plan Note (Signed)
-   We will continue during parents holding nitroglycerin as well as  ACE inhibitor therapy

## 2023-09-20 NOTE — Telephone Encounter (Signed)
Requested medication (s) are due for refill today: {yes  Requested medication (s) are on the active medication list: yes    Last refill: 06/17/23  #90  0 refills  Future visit scheduled no  Notes to clinic:Pt needs appt. Attempted to reach to secure, extended ring, unable to leave CB message  Requested Prescriptions  Pending Prescriptions Disp Refills   DULoxetine (CYMBALTA) 60 MG capsule [Pharmacy Med Name: DULOXETINE DR 60MG  CAPSULES] 90 capsule 0    Sig: TAKE 1 CAPSULE(60 MG) BY MOUTH AT BEDTIME     Psychiatry: Antidepressants - SNRI - duloxetine Failed - 09/17/2023  3:42 AM      Failed - Completed PHQ-2 or PHQ-9 in the last 360 days      Failed - Last BP in normal range    BP Readings from Last 1 Encounters:  09/20/23 133/87         Failed - Valid encounter within last 6 months    Recent Outpatient Visits           1 year ago Olecranon bursitis of left elbow   [place] [facility] Mec[place], Oswaldo Conroy, PA-C   1 year ago Uncontrolled type 2 diabetes mellitus with hyperglycemia (HCC)   [place] [facility] [place], Corrie Dandy T, NP   1 year ago Senile purpura (HCC)   [place] [facility] [place], Corrie Dandy T, NP   1 year ago Cellulitis of left upper extremity   [place] [facility] Larae Grooms, NP   1 year ago Olecranon bursitis of left elbow   [place] [facility] Gabriel Cirri, NP       Future Appointments             In 2 months End, Cristal Deer, MD [facility] [place] - Cr in normal range and within 360 days    Creat  Date Value Ref Range Status  12/19/2021 1.10 0.70 - 1.35 mg/dL Final   Creatinine, Ser  Date Value Ref Range Status  09/20/2023 1.20 0.61 - 1.24 mg/dL Final         Passed - eGFR is 30 or above and within 360 days    EGFR (African American)  Date Value Ref Range Status  02/28/2014 >60  Final   GFR calc Af Amer  Date Value Ref  Range Status  12/06/2020 75 >59 mL/min/1.73 Final    Comment:    **In accordance with recommendations from the NKF-ASN Task force,**   [facility] is in the process of updating its eGFR calculation to the   2021 CKD-EPI creatinine equation that estimates kidney function   without a race variable.    EGFR (Non-African Amer.)  Date Value Ref Range Status  02/28/2014 >60  Final    Comment:    eGFR values <70mL/min/1.73 m2 may be an indication of chronic kidney disease (CKD). Calculated eGFR is useful in patients with stable renal function. The eGFR calculation will not be reliable in acutely ill patients when serum creatinine is changing rapidly. It is not useful in  patients on dialysis. The eGFR calculation may not be applicable to patients at the low and high extremes of body sizes, pregnant women, and vegetarians.    GFR, Estimated  Date Value Ref Range Status  09/20/2023 >60 >60 mL/min Final    Comment:    (NOTE) Calculated using the CKD-EPI Creatinine Equation (2021)  GFR  Date Value Ref Range Status  01/24/2019 66.47 >60.00 mL/min Final   eGFR  Date Value Ref Range Status  07/08/2022 79 >59 mL/min/1.73 Final

## 2023-09-21 ENCOUNTER — Observation Stay: Payer: Medicare Other

## 2023-09-21 DIAGNOSIS — K219 Gastro-esophageal reflux disease without esophagitis: Secondary | ICD-10-CM | POA: Diagnosis not present

## 2023-09-21 DIAGNOSIS — M5442 Lumbago with sciatica, left side: Secondary | ICD-10-CM

## 2023-09-21 DIAGNOSIS — I10 Essential (primary) hypertension: Secondary | ICD-10-CM | POA: Diagnosis not present

## 2023-09-21 DIAGNOSIS — G8929 Other chronic pain: Secondary | ICD-10-CM

## 2023-09-21 DIAGNOSIS — R1084 Generalized abdominal pain: Secondary | ICD-10-CM | POA: Diagnosis not present

## 2023-09-21 DIAGNOSIS — M5441 Lumbago with sciatica, right side: Secondary | ICD-10-CM

## 2023-09-21 LAB — BASIC METABOLIC PANEL
Anion gap: 9 (ref 5–15)
BUN: 23 mg/dL (ref 8–23)
CO2: 21 mmol/L — ABNORMAL LOW (ref 22–32)
Calcium: 8.4 mg/dL — ABNORMAL LOW (ref 8.9–10.3)
Chloride: 103 mmol/L (ref 98–111)
Creatinine, Ser: 0.93 mg/dL (ref 0.61–1.24)
GFR, Estimated: >60 mL/min (ref >60)
Glucose, Bld: 251 mg/dL — ABNORMAL HIGH (ref 70–99)
Potassium: 4.6 mmol/L (ref 3.5–5.1)
Sodium: 133 mmol/L — ABNORMAL LOW (ref 135–145)

## 2023-09-21 LAB — CBC
HCT: 38.3 % — ABNORMAL LOW (ref 39.0–52.0)
Hemoglobin: 12.4 g/dL — ABNORMAL LOW (ref 13.0–17.0)
MCH: 29 pg (ref 26.0–34.0)
MCHC: 32.4 g/dL (ref 30.0–36.0)
MCV: 89.5 fL (ref 80.0–100.0)
Platelets: 196 10*3/uL (ref 150–400)
RBC: 4.28 MIL/uL (ref 4.22–5.81)
RDW: 14.4 % (ref 11.5–15.5)
WBC: 11.8 10*3/uL — ABNORMAL HIGH (ref 4.0–10.5)
nRBC: 0 % (ref 0.0–0.2)

## 2023-09-21 LAB — CBG MONITORING, ED: Glucose-Capillary: 238 mg/dL — ABNORMAL HIGH (ref 70–99)

## 2023-09-21 MED ORDER — LORATADINE 10 MG PO TABS
10.0000 mg | ORAL_TABLET | Freq: Every day | ORAL | Status: DC
  Filled 2023-09-21: qty 1

## 2023-09-21 MED ORDER — CALCIUM CARBONATE 1250 (500 CA) MG PO TABS: 1250.0000 mg | ORAL_TABLET | Freq: Every day | ORAL | Status: DC

## 2023-09-21 MED ORDER — VITAMIN A 3 MG (10000 UNIT) PO CAPS: 10000.0000 [IU] | ORAL_CAPSULE | Freq: Every day | ORAL | Status: DC

## 2023-09-21 MED ORDER — INSULIN GLARGINE-YFGN 100 UNIT/ML ~~LOC~~ SOLN
54.0000 [IU] | Freq: Every day | SUBCUTANEOUS | Status: DC
  Filled 2023-09-21: qty 0.54

## 2023-09-21 MED ORDER — UMECLIDINIUM BROMIDE 62.5 MCG/ACT IN AEPB
1.0000 | INHALATION_SPRAY | Freq: Every day | RESPIRATORY_TRACT | Status: DC
  Administered 2023-09-21: 1 via RESPIRATORY_TRACT
  Filled 2023-09-21: qty 7

## 2023-09-21 MED ORDER — OMEGA-3 1000 MG PO CAPS: 1000.0000 mg | ORAL_CAPSULE | Freq: Every day | ORAL | Status: DC

## 2023-09-21 MED ORDER — VITAMIN D3 25 MCG (1000 UNIT) PO TABS
1000.0000 [IU] | ORAL_TABLET | Freq: Every day | ORAL | Status: DC
  Filled 2023-09-21 (×2): qty 1

## 2023-09-21 MED ORDER — OXYCODONE HCL ER 10 MG PO T12A: 10.0000 mg | EXTENDED_RELEASE_TABLET | Freq: Two times a day (BID) | ORAL | Status: DC

## 2023-09-21 MED ORDER — VITAMIN E 180 MG (400 UNIT) PO CAPS: 400.0000 [IU] | ORAL_CAPSULE | Freq: Every day | ORAL | Status: DC

## 2023-09-21 MED ORDER — HYDROMORPHONE HCL 1 MG/ML IJ SOLN: 1.0000 mg | INTRAMUSCULAR | Status: DC | PRN

## 2023-09-21 MED ORDER — POLYETHYLENE GLYCOL 3350 17 G PO PACK
17.0000 g | PACK | Freq: Two times a day (BID) | ORAL | Status: DC
Start: 2023-09-21 — End: 2023-09-21
  Filled 2023-09-21: qty 1

## 2023-09-21 MED ORDER — ADULT MULTIVITAMIN W/MINERALS CH
1.0000 | ORAL_TABLET | Freq: Every day | ORAL | Status: DC
  Filled 2023-09-21: qty 1

## 2023-09-21 MED ORDER — INSULIN ASPART 100 UNIT/ML IJ SOLN: 22.0000 [IU] | Freq: Three times a day (TID) | INTRAMUSCULAR | Status: DC

## 2023-09-21 MED ORDER — MOMETASONE FURO-FORMOTEROL FUM 200-5 MCG/ACT IN AERO
2.0000 | INHALATION_SPRAY | Freq: Two times a day (BID) | RESPIRATORY_TRACT | Status: DC
  Administered 2023-09-21: 2 via RESPIRATORY_TRACT
  Filled 2023-09-21: qty 8.8

## 2023-09-21 MED ORDER — ASPIRIN 81 MG PO TBEC
81.0000 mg | DELAYED_RELEASE_TABLET | Freq: Every day | ORAL | Status: DC
  Filled 2023-09-21: qty 1

## 2023-09-21 MED ORDER — BISACODYL 5 MG PO TBEC: 10.0000 mg | DELAYED_RELEASE_TABLET | Freq: Every day | ORAL | Status: DC

## 2023-09-21 MED ORDER — VITAMIN C 500 MG PO TABS
1000.0000 mg | ORAL_TABLET | Freq: Every day | ORAL | Status: DC
  Filled 2023-09-21: qty 2

## 2023-09-21 NOTE — Discharge Instructions (Addendum)
Patient advised to resume his long-acting, short acting insulin and check blood glucose follow-up sugars with endocrinology patient to follow-up on his new appointment at the pain clinic for his chronic pain recommend patient use LS corset while ambulating follow-up PCP in 1 to 2 weeks

## 2023-09-21 NOTE — Discharge Summary (Signed)
Physician Discharge Summary   Patient: Edward Schneider. MRN: [id number] DOB: 04/01/1956  Admit date:     09/20/2023  Discharge date: 09/21/23  Discharge Physician: Enedina Finner   PCP: System, Provider Not In   Recommendations at discharge:    Patient advised to resume his long-acting, short acting insulin and check blood glucose follow-up sugars with endocrinology patient to follow-up on his new appointment at the pain clinic for his chronic pain recommend patient use LS corset while ambulating follow-up PCP in 1 to 2 weeks  Discharge Diagnoses: Principal Problem:   Abdominal pain Active Problems:   Near syncope   Essential hypertension   Hyperkalemia   Type 2 diabetes mellitus without complications (HCC)   Dyslipidemia   GERD without esophagitis   Coronary artery disease  Fox [name]. is a 67 y.o. male with medical history significant for asthma, DDD, grade 1 diastolic function, pancreatitis, GERD, hypertension, dyslipidemia, seizure disorder, type 2 diabetes mellitus, OSA on CPAP, cervical and lumbar DDD, coronary artery disease as s/p PCI and stent and C. difficile colitis,, who presented to the ER with acute onset of diffuse abdominal pain over the last few days.  Patient was seen in the ER on 27 November. CT angiogram abdomen was within normal limits no acute abnormality noted. Patient was sent home thereafter.  Abdominal pelvic CT scan revealed aortic atherosclerosis with no acute intra-abdominal or pelvic pathology.   Diffuse abdominal pain appears more neuropathic/?radicular chronic pain syndrome chronic back pain with cervical and lumbar surgeries in the past -- patient has no intra-abdominal pathology as noted on two recent CT scans done  -- His pain is diffuse and constant. -- Wife in the ER at bedside. Patient and wife told me that this pain has been on and off for many years. Patient is also seen and followed at the pain clinic. -- Patient has had several back  surgeries and looks like this is radicular pain. -- X-ray lumbar spine shows chronic changes of DJD. -- Patient already followed at the pain clinic and is on long-acting OxyContin and short-acting oxycodone -- he also is on Lyrica -- discussed at length with patient and wife that this appears to be chronic pain and at present given negative workup without any intra-abdominal pathology. They did voice understanding. -- Patient has appointment with new pain clinic next week. He seemed to be frustrated with the current one. -- Advised him to use LS corset to see if that helps -- per patient's wife he has been not active at home since most of the time in his left chair and not able to carry out much work other than his ADLs given his chronic back issues.  Type II diabetes, hyperglycemia, long history of insulin use -- patient follows with endocrinology. Continue long-acting insulin, Humalog TID with meals and farxiga  Edward Schneider -- continue Protonix  Hypertension -- continue home meds  Offered physical therapy to see if patient prefers to have them evaluate. Patient declined and thereby will discharge patient to home per his request.       Pain control - [facility] Controlled Substance Reporting System database was reviewed. and patient was instructed, not to drive, operate heavy machinery, perform activities at heights, swimming or participation in water activities or provide baby-sitting services while on Pain, Sleep and Anxiety Medications; until their outpatient Physician has advised to do so again. Also recommended to not to take more than prescribed Pain, Sleep and Anxiety Medications.  Consultants: none  Disposition: Home Diet recommendation:  Discharge Diet Orders (From admission, onward)     Start     Ordered   09/21/23 0000  Diet - low sodium heart healthy        09/21/23 1203           Cardiac and Carb modified diet DISCHARGE MEDICATION: Allergies as of 09/21/2023        Reactions   Bee Venom Anaphylaxis   Crestor [rosuvastatin Calcium] Shortness Of Breath, Swelling   Fentanyl Itching, Hives   blisters Patch   Gabapentin Diarrhea   Severe diarrhea which caused incontinence, loss of appetite and weight loss.   Shellfish Allergy Anaphylaxis, Swelling   Shrimp causes throat to swell and tingling in tongue.    Fire [employer]    Furosemide Nausea And Vomiting   Buprenorphine Hcl Itching   Chlorhexidine Gluconate Itching, Rash   Simvastatin Diarrhea        Medication List     TAKE these medications    Accu-Chek Guide test strip Generic drug: glucose blood Check blood sugar 2 times daily   Accu-Chek Softclix Lancets lancets Check blood sugar 2 times daily   acetaminophen 650 MG CR tablet Commonly known as: TYLENOL Take 1,300 mg by mouth every 8 (eight) hours.   albuterol 108 (90 Base) MCG/ACT inhaler Commonly known as: VENTOLIN HFA INHALE 2 PUFFS INTO THE LUNGS EVERY 6 HOURS AS NEEDED FOR WHEEZING OR SHORTNESS OF BREATH   aspirin EC 81 MG tablet Take 81 mg by mouth daily. Swallow whole.   atorvastatin 80 MG tablet Commonly known as: LIPITOR TAKE 1 TABLET(80 MG) BY MOUTH AT BEDTIME   benazepril 10 MG tablet Commonly known as: LOTENSIN Take 1 tablet (10 mg total) by mouth daily.   bisacodyl 5 MG EC tablet Commonly known as: Dulcolax Take 2 tablets (10 mg total) by mouth daily in the afternoon.   Breztri Aerosphere 160-9-4.8 MCG/ACT Aero Generic drug: Budeson-Glycopyrrol-Formoterol Inhale 2 puffs into the lungs in the morning and at bedtime.   calcium carbonate 600 MG Tabs tablet Commonly known as: OS-CAL Take 600 mg by mouth daily.   Centrum Silver 50+Men Tabs Take 1 tablet by mouth daily.   cetirizine 10 MG tablet Commonly known as: ZYRTEC Take 1 tablet (10 mg total) by mouth daily.   Edward Schneider Take 1,000 mg by mouth 2 (two) times daily.   clobetasol cream 0.05 % Commonly known as: TEMOVATE Apply 1 application topically 2  (two) times daily.   Comfort EZ Pen Needles 32G X 4 MM Misc Generic drug: Insulin Pen Needle Use to inject insulin 5 times daily   cyanocobalamin 1000 MCG tablet Commonly known as: VITAMIN B12 Take 1,000 mcg by mouth daily.   cyclobenzaprine 10 MG tablet Commonly known as: FLEXERIL Take 1 tablet (10 mg total) by mouth 3 (three) times daily as needed for muscle spasms.   dapagliflozin propanediol 10 MG Tabs tablet Commonly known as: Farxiga Take 1 tablet (10 mg total) by mouth daily before breakfast.   diclofenac Sodium 1 % Gel Commonly known as: VOLTAREN Apply 2 g topically 4 (four) times daily as needed (pain).   diphenhydrAMINE 25 mg capsule Commonly known as: BENADRYL Take 50 mg by mouth in the morning, at noon, and at bedtime.   doxepin 25 MG capsule Commonly known as: SINEQUAN Take 2 capsules (50 mg total) by mouth at bedtime.   DULoxetine 60 MG capsule Commonly known as: CYMBALTA TAKE 1 CAPSULE(60 MG) BY MOUTH AT BEDTIME  EPINEPHrine 0.3 mg/0.3 mL Soaj injection Commonly known as: EPI-PEN Inject 0.3 mg into the muscle as needed for anaphylaxis.   ezetimibe 10 MG tablet Commonly known as: ZETIA Take 1 tablet (10 mg total) by mouth daily.   famotidine 20 MG tablet Commonly known as: PEPCID TAKE 1 TABLET BY MOUTH TWICE DAILY   FreeStyle Libre 3 Plus Sensor Misc 1 Device by Other route every 14 (fourteen) days. Change sensor every 15 days.   GINGER Schneider Take 1 tablet by mouth daily.   Ginseng 100 MG Caps Take 100 mg by mouth daily.   isosorbide mononitrate 60 MG 24 hr tablet Commonly known as: IMDUR Take 1 tablet (60 mg total) by mouth daily.   ketorolac 10 MG tablet Commonly known as: TORADOL Take 10 mg by mouth every 8 (eight) hours.   Lantus 100 UNIT/ML injection Generic drug: insulin glargine Inject 54 Units into the skin at bedtime.   lidocaine 2 % solution Commonly known as: XYLOCAINE Use as directed 15 mLs in the mouth or throat as needed  for mouth pain.   Minoxidil 5 % Foam Apply 1 g topically daily.   mometasone 0.1 % ointment Commonly known as: ELOCON Apply topically 2 (two) times daily as needed.   mupirocin ointment 2 % Commonly known as: BACTROBAN Apply 1 Application topically 2 (two) times daily.   naproxen sodium 220 MG tablet Commonly known as: ALEVE Take 440 mg by mouth daily as needed (body aches).   Narcan 4 MG/0.1ML Liqd nasal spray kit Generic drug: naloxone Place 0.4 mg into the nose as needed (opioid overdose).   nitroGLYCERIN 0.4 MG SL tablet Commonly known as: NITROSTAT Place 0.4 mg under the tongue every 5 (five) minutes as needed for chest pain.   Omega-3 1000 MG Caps Take 1,000 mg by mouth daily.   ondansetron 4 MG tablet Commonly known as: ZOFRAN TAKE 1 TABLET(4 MG) BY MOUTH EVERY 8 HOURS AS NEEDED FOR NAUSEA OR VOMITING   OxyCONTIN 10 MG 12 hr tablet Generic drug: oxyCODONE Take 10 mg by mouth every 12 (twelve) hours.   oxyCODONE 15 MG immediate release tablet Commonly known as: ROXICODONE Take 15 mg by mouth every 4 (four) hours as needed. :1 Tablet(s) By Mouth Every 4-6 Hours   pantoprazole 40 MG tablet Commonly known as: PROTONIX TAKE 1 TABLET(40 MG) BY MOUTH TWICE DAILY   Pharmacist Choice Alcohol Pads SMARTSIG:1 Each Topical 4 Times Daily   polyethylene glycol 17 g packet Commonly known as: MIRALAX / GLYCOLAX Take 17 g by mouth 2 (two) times daily.   pregabalin 75 MG capsule Commonly known as: LYRICA Take 75 mg by mouth 2 (two) times daily.   senna-docusate 8.6-50 MG tablet Commonly known as: Senokot-S Take 1 tablet by mouth 2 (two) times daily.   sucralfate 1 g tablet Commonly known as: CARAFATE TAKE 1 TABLET BY MOUTH FOUR TIMES DAILY AT BEDTIME WITH MEALS   traZODone 100 MG tablet Commonly known as: DESYREL Take 1 tablet (100 mg total) by mouth at bedtime as needed. for sleep   Trulance 3 MG Tabs Generic drug: Plecanatide Take 1 tablet by mouth daily.    vitamin A 78295 UNIT capsule Take 10,000 Units by mouth daily.   vitamin C 1000 MG tablet Take 1,000 mg by mouth daily.   Vitamin D3 25 MCG (1000 UT) Chew Chew 1,000 Units by mouth daily.   Vitamin E 400 units Tabs Take 400 Units by mouth daily.  Discharge Exam: Filed Weights   09/20/23 1612  Weight: 89.8 kg   Alert and oriented times three Obese respiratory clear to auscultation cardiovascular both heart sounds normal abdomen soft, patient winces on even putting fingers on his abdomen all over lower and in the flank to the back. Has DJD changes neuro- grossly intact. No motor deficit noted  Condition at discharge: fair  The results of significant diagnostics from this hospitalization (including imaging, microbiology, ancillary and laboratory) are listed below for reference.   Imaging Studies: DG Lumbar Spine 2-3 Views  Result Date: 09/21/2023 CLINICAL DATA:  Low back pain. EXAM: LUMBAR SPINE - 2-3 VIEW COMPARISON:  10/24/2019. FINDINGS: There are 5 nonrib-bearing lumbar vertebrae. Patient is status post posterior spinal fixation of L4 through S1. Redemonstration of grade 1/2 anterolisthesis of L5 over S1. There is minimal/grade 1 retrolisthesis of L3 over L4, also similar to the prior study. Vertebral body heights are maintained. No aggressive osseous lesion. Mild-moderate multilevel degenerative changes in the form of reduced intervertebral disc height, facet arthropathy and marginal osteophyte formation. Sacroiliac joints are symmetric. Visualized soft tissues are within normal limits. IMPRESSION: *No acute osseous abnormality of the lumbar spine. *Mild-to-moderate degenerative changes, as described above. Electronically Signed   By: Jules Schick M.D.   On: 09/21/2023 10:37   CT ABDOMEN PELVIS W CONTRAST  Result Date: 09/20/2023 CLINICAL DATA:  Abdominal pain. EXAM: CT ABDOMEN AND PELVIS WITH CONTRAST TECHNIQUE: Multidetector CT imaging of the abdomen and pelvis  was performed using the standard protocol following bolus administration of intravenous contrast. RADIATION DOSE REDUCTION: This exam was performed according to the departmental dose-optimization program which includes automated exposure control, adjustment of the mA and/or kV according to patient size and/or use of iterative reconstruction technique. CONTRAST:  OMNIPAQUE IOHEXOL 300 MG/ML  SOLN COMPARISON:  CT abdomen pelvis dated 09/14/2023. FINDINGS: Lower chest: The visualized lung bases are clear. No intra-abdominal free air or free fluid. Hepatobiliary: The liver is unremarkable. No biliary dilatation. The gallbladder is unremarkable. Pancreas: Unremarkable. No pancreatic ductal dilatation or surrounding inflammatory changes. Spleen: Normal in size without focal abnormality. Adrenals/Urinary Tract: The adrenal glands are unremarkable. There is no hydronephrosis on either side. There is symmetric enhancement and excretion of contrast by both kidneys. The visualized ureters and urinary bladder probable. Stomach/Bowel: There is no bowel obstruction or active inflammation. Appendectomy. Vascular/Lymphatic: Mild aortoiliac atherosclerotic disease. The IVC is unremarkable. No portal venous gas. There is no adenopathy. Reproductive: The prostate and seminal vesicles are grossly unremarkable. No pelvic mass. Other: None Musculoskeletal: Lower lumbar laminectomy and fusion. Total right hip arthroplasty. No acute osseous pathology. IMPRESSION: 1. No acute intra-abdominal or pelvic pathology. 2.  Aortic Atherosclerosis (ICD10-I70.0). Electronically Signed   By: Elgie Collard M.D.   On: 09/20/2023 21:26   CT Angio Chest/Abd/Pel for Dissection W and/or Wo Contrast  Result Date: 09/14/2023 CLINICAL DATA:  Acute aortic syndrome suspected. EXAM: CT ANGIOGRAPHY CHEST, ABDOMEN AND PELVIS TECHNIQUE: Noncontrast CT chest CT was performed. Multidetector CT imaging through the chest, abdomen and pelvis was performed  using the standard protocol during bolus administration of intravenous contrast. Multiplanar reconstructed images and MIPs were obtained and reviewed to evaluate the vascular anatomy. RADIATION DOSE REDUCTION: This exam was performed according to the departmental dose-optimization program which includes automated exposure control, adjustment of the mA and/or kV according to patient size and/or use of iterative reconstruction technique. CONTRAST:  OMNIPAQUE IOHEXOL 350 MG/ML SOLN COMPARISON:  CT abdomen and pelvis 05/11/2023 FINDINGS: CTA CHEST  FINDINGS Cardiovascular: Satisfactory opacification of the pulmonary arteries to the segmental level. No evidence of pulmonary embolism. Normal heart size. No pericardial effusion. There is mild calcified atherosclerotic disease in the aorta and coronary arteries. Mediastinum/Nodes: No enlarged mediastinal, hilar, or axillary lymph nodes. Thyroid gland, trachea, and esophagus demonstrate no significant findings. Lungs/Pleura: Lungs are clear. No pleural effusion or pneumothorax. Musculoskeletal: No chest wall abnormality. No acute or significant osseous findings. Review of the MIP images confirms the above findings. CTA ABDOMEN AND PELVIS FINDINGS VASCULAR Aorta: Normal caliber aorta without aneurysm, dissection, vasculitis or significant stenosis. There is mild calcified atherosclerotic disease throughout the aorta. Celiac: Patent without evidence of aneurysm, dissection, vasculitis or significant stenosis. SMA: Patent without evidence of aneurysm, dissection, vasculitis or significant stenosis. Renals: Both renal arteries are patent without evidence of aneurysm, dissection, vasculitis, fibromuscular dysplasia or significant stenosis. IMA: Patent without evidence of aneurysm, dissection, vasculitis or significant stenosis. Inflow: Patent without evidence of aneurysm, dissection, vasculitis or significant stenosis. Veins: No obvious venous abnormality within the  limitations of this arterial phase study. Review of the MIP images confirms the above findings. NON-VASCULAR Hepatobiliary: No focal liver abnormality is seen. No gallstones, gallbladder wall thickening, or biliary dilatation. Pancreas: Unremarkable. No pancreatic ductal dilatation or surrounding inflammatory changes. Spleen: Normal in size without focal abnormality. Adrenals/Urinary Tract: Adrenal glands are unremarkable. Kidneys are normal, without renal calculi, focal lesion, or hydronephrosis. Bladder is unremarkable. Stomach/Bowel: Stomach is within normal limits. Appendix is not seen. No evidence of bowel wall thickening, distention, or inflammatory changes. Lymphatic: No enlarged lymph nodes are identified. Reproductive: Prostate is unremarkable. Other: There is a small fat containing umbilical hernia. There is no ascites. Musculoskeletal: Fusion hardware is seen at L4, L5 and S1. right hip arthroplasty is present. Review of the MIP images confirms the above findings. IMPRESSION: 1. No evidence for aortic dissection or aneurysm. 2. No acute localizing process in the chest, abdomen or pelvis. 3. Small fat containing umbilical hernia. 4. Aortic atherosclerosis. Aortic Atherosclerosis (ICD10-I70.0). Electronically Signed   By: Darliss Cheney M.D.   On: 09/14/2023 21:41   DG Chest 2 View  Result Date: 09/14/2023 CLINICAL DATA:  Chest pain. EXAM: CHEST - 2 VIEW COMPARISON:  02/02/2023. FINDINGS: Bilateral lung fields are clear. Bilateral costophrenic angles are clear. Normal cardio-mediastinal silhouette. No acute osseous abnormalities. The soft tissues are within normal limits. IMPRESSION: *No active cardiopulmonary disease. Electronically Signed   By: Jules Schick M.D.   On: 09/14/2023 18:56    Microbiology: Results for orders placed or performed during the hospital encounter of 08/20/21  SARS CORONAVIRUS 2 (TAT 6-24 HRS) Nasopharyngeal Nasopharyngeal Swab     Status: None   Collection Time: 08/20/21   9:00 AM   Specimen: Nasopharyngeal Swab  Result Value Ref Range Status   SARS Coronavirus 2 NEGATIVE NEGATIVE Final    Comment: (NOTE) SARS-CoV-2 target nucleic acids are NOT DETECTED.  The SARS-CoV-2 RNA is generally detectable in upper and lower respiratory specimens during the acute phase of infection. Negative results do not preclude SARS-CoV-2 infection, do not rule out co-infections with other pathogens, and should not be used as the sole basis for treatment or other patient management decisions. Negative results must be combined with clinical observations, patient history, and epidemiological information. The expected result is Negative.  Fact Sheet for Patients: HairSlick.no  Fact Sheet for Healthcare Providers: quierodirigir.com  This test is not yet approved or cleared by the [place] FDA and  has been authorized for detection and/or  diagnosis of SARS-CoV-2 by FDA under an Emergency Use Authorization (EUA). This EUA will remain  in effect (meaning this test can be used) for the duration of the COVID-19 declaration under Se ction 564(b)(1) of the Act, 21 U.S.C. section 360bbb-3(b)(1), unless the authorization is terminated or revoked sooner.  Performed at [facility], [address], [place], [place] [address]    *Note: Due to a large number of results and/or encounters for the requested time period, some results have not been displayed. A complete set of results can be found in Results Review.    Labs: CBC: Recent Labs  Lab 09/14/23 1803 09/20/23 1614 09/21/23 0348  WBC 12.3* 14.3* 11.8*  HGB 12.9* 14.5 12.4*  HCT 39.7 44.5 38.3*  MCV 86.3 86.6 89.5  PLT 203 273 196   Basic Metabolic Panel: Recent Labs  Lab 09/14/23 1803 09/20/23 1614 09/21/23 0348  NA 136 136 133*  K 3.4* 5.5* 4.6  CL 104 101 103  CO2 22 24 21*  GLUCOSE 116* 208* 251*  BUN 22 25* 23  CREATININE 0.91 1.20 0.93   CALCIUM 9.7 9.9 8.4*   Liver Function Tests: Recent Labs  Lab 09/20/23 1614  AST 24  ALT 25  ALKPHOS 56  BILITOT 0.9  PROT 8.2*  ALBUMIN 4.4   CBG: Recent Labs  Lab 09/21/23 0346  GLUCAP 238*    Discharge time spent: greater than 30 minutes.  Signed: Enedina Finner, MD [facility] 09/21/2023

## 2023-09-21 NOTE — Inpatient Diabetes Management (Signed)
Inpatient Diabetes Program Recommendations  AACE/ADA: New Consensus Statement on Inpatient Glycemic Control (2015)  Target Ranges:  Prepandial:   less than 140 mg/dL      Peak postprandial:   less than 180 mg/dL (1-2 hours)      Critically ill patients:  140 - 180 mg/dL    Latest Reference Range & Units 02/02/23 12:08 08/23/23 11:57  Hemoglobin A1C 4.0 - 5.6 % 10.6 (H) 10.1 !  243 mg/dl  (H): Data is abnormally high !: Data is abnormal  Latest Reference Range & Units 09/21/23 03:46  Glucose-Capillary 70 - 99 mg/dL 409 (H)  (H): Data is abnormally high    Admit with: Abd Pain/ Near Syncope/ Hyperkalemia  History: DM  Home DM Meds: Farxiga 10 mg daily       Humalog (NOT taking)       Lantus 54 units QHS   Current Orders: Farxiga 10 mg daily    MD- Based on last visit with ENDO (see note below), please consider the following:  1. Start Semglee 25 units Daily (50% home dose to start)  2. Start Novolog Moderate Correction Scale/ SSI (0-15 units) TID AC + HS  3. Start Novolog Meal Coverage: Novolog 7 units TID with meals (1/3 total home dose) HOLD if pt NPO HOLD if pt eats <50% meals    ENDO: Dr. Lonzo Cloud with [facility] Last Seen 08/23/2023 Was asked to take the following: Restart Basaglar 54 units daily  Continue Humalog 22 units with each meal  Continue Farxiga 10 mg, 1 tablet with Breakfast  Continue correction factor: Humalog (BG -130/25)    --Will follow patient during hospitalization--  Ambrose Finland RN, MSN, CDCES Diabetes Coordinator Inpatient Glycemic Control Team Team Pager: [phone] (8a-5p)

## 2023-09-26 ENCOUNTER — Other Ambulatory Visit: Payer: Self-pay | Admitting: Nurse Practitioner

## 2023-09-28 NOTE — Telephone Encounter (Signed)
Requested medication (s) are due for refill today - yes  Requested medication (s) are on the active medication list -yes  Future visit scheduled -no  Last refill: 07/08/22 #90 4RF  Notes to clinic: provider no longer listed as PCP- sent for review of request  Requested Prescriptions  Pending Prescriptions Disp Refills   benazepril (LOTENSIN) 10 MG tablet [Pharmacy Med Name: BENAZEPRIL 10MG  TABLETS] 90 tablet 4    Sig: TAKE 1 TABLET(10 MG) BY MOUTH DAILY     Cardiovascular:  ACE Inhibitors Failed - 09/26/2023 11:56 AM      Failed - Last BP in normal range    BP Readings from Last 1 Encounters:  09/21/23 (!) 140/91         Failed - Valid encounter within last 6 months    Recent Outpatient Visits           1 year ago Olecranon bursitis of left elbow   [place] [facility] Mecum, Oswaldo Conroy, PA-C   1 year ago Uncontrolled type 2 diabetes mellitus with hyperglycemia (HCC)   [place] [facility] [place], Hamlin T, NP   1 year ago Senile purpura (HCC)   [place] [facility] [place], Corrie Dandy T, NP   1 year ago Cellulitis of left upper extremity   [place] [facility] Larae Grooms, NP   1 year ago Olecranon bursitis of left elbow   [place] [facility] Gabriel Cirri, NP       Future Appointments             In 1 month End, Cristal Deer, MD [facility] - Cr in normal range and within 180 days    Creat  Date Value Ref Range Status  12/19/2021 1.10 0.70 - 1.35 mg/dL Final   Creatinine, Ser  Date Value Ref Range Status  09/21/2023 0.93 0.61 - 1.24 mg/dL Final         Passed - K in normal range and within 180 days    Potassium  Date Value Ref Range Status  09/21/2023 4.6 3.5 - 5.1 mmol/L Final  02/28/2014 4.0 3.5 - 5.1 mmol/L Final         Passed - Patient is not pregnant         Requested Prescriptions  Pending Prescriptions Disp Refills    benazepril (LOTENSIN) 10 MG tablet [Pharmacy Med Name: BENAZEPRIL 10MG  TABLETS] 90 tablet 4    Sig: TAKE 1 TABLET(10 MG) BY MOUTH DAILY     Cardiovascular:  ACE Inhibitors Failed - 09/26/2023 11:56 AM      Failed - Last BP in normal range    BP Readings from Last 1 Encounters:  09/21/23 (!) 140/91         Failed - Valid encounter within last 6 months    Recent Outpatient Visits           1 year ago Olecranon bursitis of left elbow   [place] [facility] Mecum, Erin E, PA-C   1 year ago Uncontrolled type 2 diabetes mellitus with hyperglycemia (HCC)   [place] [facility] [place], Corrie Dandy T, NP   1 year ago Senile purpura (HCC)   [place] [facility] [place], Corrie Dandy T, NP   1 year ago Cellulitis of left upper extremity   [place] [facility] Larae Grooms, NP   1 year ago  Olecranon bursitis of left elbow   [place] [facility] Gabriel Cirri, NP       Future Appointments             In 1 month End, Cristal Deer, MD [place] [facility] - Cr in normal range and within 180 days    Creat  Date Value Ref Range Status  12/19/2021 1.10 0.70 - 1.35 mg/dL Final   Creatinine, Ser  Date Value Ref Range Status  09/21/2023 0.93 0.61 - 1.24 mg/dL Final         Passed - K in normal range and within 180 days    Potassium  Date Value Ref Range Status  09/21/2023 4.6 3.5 - 5.1 mmol/L Final  02/28/2014 4.0 3.5 - 5.1 mmol/L Final         Passed - Patient is not pregnant

## 2023-11-02 ENCOUNTER — Encounter: Payer: Self-pay | Admitting: Acute Care

## 2023-11-15 ENCOUNTER — Telehealth: Payer: Self-pay

## 2023-11-15 NOTE — Telephone Encounter (Signed)
Called the patient [phone], someone picks up but does not say anything.

## 2023-11-16 NOTE — Telephone Encounter (Signed)
Patient advised and verbalized understanding

## 2023-11-16 NOTE — Telephone Encounter (Signed)
Patient states that his BS have been dropping at night time  11/15/23 68  after dinner    11/14/23  58  after dinner    11/13/23  67 after dinner    Humalog 25 units 3 times daily  Basaglar 54 units

## 2023-11-24 ENCOUNTER — Ambulatory Visit: Payer: Medicare Other | Admitting: Internal Medicine

## 2023-11-24 ENCOUNTER — Telehealth: Payer: Self-pay | Admitting: Pulmonary Disease

## 2023-11-24 NOTE — Telephone Encounter (Signed)
Atc pt. Unable to lvm

## 2023-11-24 NOTE — Telephone Encounter (Signed)
 Chart states that patient is using [employer]. They have not gone out of business. I have called the patient 3 times.  Unable to leave message. VM either full or unavailable. Will try again later.

## 2023-11-24 NOTE — Progress Notes (Deleted)
  Cardiology Office Note:  .   Date:  11/24/2023  ID:  Edward JAYSON Leocadia Mickey., DOB 1956-02-23, MRN [id number] PCP: System, Provider Not In  [facility] Providers Cardiologist:  Lonni Hanson, MD { Click to update primary MD,subspecialty MD or APP then REFRESH:1}    History of Present Illness: .   Edward Schneider. is a 68 y.o. male with history of coronary artery disease status post remote PTCA (details unknown) and more recent PCI to the mid LAD (11/20) hypertension, mini stroke, liver cancer, GERD, and asthma, who presents for follow-up of CAD.  He was last seen in our office in 01/2022 by Cadence Furth, PA, at which time he recounted a single episode of chest pain while in [place] , helping his grandson move.  Pain lasted for 3 to 4 hours and improved with sublingual nitroglycerin .  No medication changes or further testing were pursued.  He presented to the [facility] emergency department in late 08/2023 with chest pain that was unresponsive to nitroglycerin  and oxycodone .  Workup in the ED was unrevealing.  He returned to the ED a week later complaining of persistent abdominal pain and was admitted for overnight observation.  It was felt that his diffuse abdominal pain was more consistent with neuropathic etiology.  ROS: See HPI  Studies Reviewed: .        *** Risk Assessment/Calculations:   {Does this patient have ATRIAL FIBRILLATION?:[phone]} No BP recorded.  {Refresh Note OR Click here to enter BP  :1}***       Physical Exam:   VS:  There were no vitals taken for this visit.   Wt Readings from Last 3 Encounters:  09/20/23 198 lb (89.8 kg)  09/14/23 218 lb (98.9 kg)  08/23/23 229 lb (103.9 kg)    General:  NAD. Neck: No JVD or HJR. Lungs: Clear to auscultation bilaterally without wheezes or crackles. Heart: Regular rate and rhythm without murmurs, rubs, or gallops. Abdomen: Soft, nontender, nondistended. Extremities: No lower extremity edema.  ASSESSMENT AND PLAN: .     ***    {Are you ordering a CV Procedure (e.g. stress test, cath, DCCV, TEE, etc)?   Press F2        :[id number]}  Dispo: ***  Signed, Lonni Hanson, MD

## 2023-11-24 NOTE — Progress Notes (Deleted)
 [name], PA-C [address]  [address]  [place], [place] [address]  Main: [phone]  Fax: [phone]   Primary Care Physician: System, Provider Not In  Primary Gastroenterologist:  ***  CC:  Chronic Pancreatitis; Constipation  HPI: Edward Schneider. is a 68 y.o. male, previous patient of Dr. Jinny, presents for evaluation of constipation and chronic pancreatitis.  Current symptoms: Patient went to ED 09/20/2023 to evaluate generalized abdominal pain.  CT and labs unrevealing.  Alcohol?  Takes Protonix  40 Mg once daily for GERD.  Has been on MiraLAX , Dulcolax, and Senokot for constipation.  Zofran  as needed nausea.  Takes chronic opioid medication for chronic pain.  09/20/2023 CT abdomen pelvis with contrast: Pancreas was unremarkable.  No evidence of pancreatitis.  No acute abnormality.  No bowel inflammation.  09/2023 labs: Normal lipase 38.  Glucose 251.  BUN 23, creatinine 0.93.  Hemoglobin 14.5, WBC 11.8.  Platelet 196.  Hemoglobin A1c 10.1.  Medical history significant for CAD, liver cancer 03/2015, history of C. difficile diarrhea, chronic pain, GERD, hepatic steatosis, hypertension, hyperlipidemia, history of MI, sleep apnea on CPAP, uncontrolled type 2 diabetes, seizures, TIA.  GI History: -05/2020: Last colonoscopy by Dr. Jinny: Poor prep, stool in the entire colon, internal hemorrhoids, no specimens collected.  5-year repeat with extra prep. -09/2017 EGD: Large amount of food in the stomach, otherwise normal.  Patient never completed gastric emptying study which was recommended to check for gastroparesis. -02/2016 colonoscopy: Fair prep, internal hemorrhoids, no polyps.  Current Outpatient Medications  Medication Sig Dispense Refill   Accu-Chek Softclix Lancets lancets Check blood sugar 2 times daily 100 each 12   acetaminophen  (TYLENOL ) 650 MG CR tablet Take 1,300 mg by mouth every 8 (eight) hours.     albuterol  (VENTOLIN  HFA) 108 (90 Base) MCG/ACT inhaler  INHALE 2 PUFFS INTO THE LUNGS EVERY 6 HOURS AS NEEDED FOR WHEEZING OR SHORTNESS OF BREATH 6.7 g 11   Alcohol Swabs (PHARMACIST CHOICE ALCOHOL) PADS SMARTSIG:1 Each Topical 4 Times Daily     Ascorbic Acid  (VITAMIN C ) 1000 MG tablet Take 1,000 mg by mouth daily.      aspirin  EC 81 MG tablet Take 81 mg by mouth daily. Swallow whole.     atorvastatin  (LIPITOR ) 80 MG tablet TAKE 1 TABLET(80 MG) BY MOUTH AT BEDTIME 90 tablet 0   benazepril  (LOTENSIN ) 10 MG tablet Take 1 tablet (10 mg total) by mouth daily. 90 tablet 4   bisacodyl  (DULCOLAX) 5 MG EC tablet Take 2 tablets (10 mg total) by mouth daily in the afternoon. 30 tablet 1   Budeson-Glycopyrrol-Formoterol  (BREZTRI  AEROSPHERE) 160-9-4.8 MCG/ACT AERO Inhale 2 puffs into the lungs in the morning and at bedtime. 10.7 g 5   calcium  carbonate (OS-CAL) 600 MG TABS tablet Take 600 mg by mouth daily.      cetirizine  (ZYRTEC ) 10 MG tablet Take 1 tablet (10 mg total) by mouth daily. 90 tablet 4   Cholecalciferol  (VITAMIN D3) 25 MCG (1000 UT) CHEW Chew 1,000 Units by mouth daily.      CINNAMON  PO Take 1,000 mg by mouth 2 (two) times daily.      clobetasol  cream (TEMOVATE ) 0.05 % Apply 1 application topically 2 (two) times daily. 30 g 0   COMFORT EZ PEN NEEDLES 32G X 4 MM MISC Use to inject insulin  5 times daily 400 each 2   Continuous Glucose Sensor (FREESTYLE LIBRE 3 PLUS SENSOR) MISC 1 Device by Other route every 14 (fourteen) days.  Change sensor every 15 days. 6 each 3   cyclobenzaprine  (FLEXERIL ) 10 MG tablet Take 1 tablet (10 mg total) by mouth 3 (three) times daily as needed for muscle spasms. 60 tablet 1   dapagliflozin  propanediol (FARXIGA ) 10 MG TABS tablet Take 1 tablet (10 mg total) by mouth daily before breakfast. 90 tablet 4   diclofenac  Sodium (VOLTAREN ) 1 % GEL Apply 2 g topically 4 (four) times daily as needed (pain).      diphenhydrAMINE  (BENADRYL ) 25 mg capsule Take 50 mg by mouth in the morning, at noon, and at bedtime. (Patient not taking:  Reported on 09/21/2023)     doxepin  (SINEQUAN ) 25 MG capsule Take 2 capsules (50 mg total) by mouth at bedtime. 180 capsule 4   DULoxetine  (CYMBALTA ) 60 MG capsule TAKE 1 CAPSULE(60 MG) BY MOUTH AT BEDTIME 90 capsule 0   EPINEPHrine  0.3 mg/0.3 mL IJ SOAJ injection Inject 0.3 mg into the muscle as needed for anaphylaxis. 1 each 12   ezetimibe  (ZETIA ) 10 MG tablet Take 1 tablet (10 mg total) by mouth daily. 90 tablet 4   famotidine  (PEPCID ) 20 MG tablet TAKE 1 TABLET BY MOUTH TWICE DAILY 180 tablet 0   Ginger , Zingiber officinalis, (GINGER  PO) Take 1 tablet by mouth daily.     Ginseng 100 MG CAPS Take 100 mg by mouth daily.      glucose blood (ACCU-CHEK GUIDE) test strip Check blood sugar 2 times daily 100 each 12   isosorbide  mononitrate (IMDUR ) 60 MG 24 hr tablet Take 1 tablet (60 mg total) by mouth daily. 90 tablet 2   ketorolac  (TORADOL ) 10 MG tablet Take 10 mg by mouth every 8 (eight) hours.     LANTUS  100 UNIT/ML injection Inject 54 Units into the skin at bedtime.     lidocaine  (XYLOCAINE ) 2 % solution Use as directed 15 mLs in the mouth or throat as needed for mouth pain. 100 mL 2   Minoxidil 5 % FOAM Apply 1 g topically daily.     mometasone  (ELOCON ) 0.1 % ointment Apply topically 2 (two) times daily as needed.     Multiple Vitamins-Minerals (CENTRUM SILVER 50+MEN) TABS Take 1 tablet by mouth daily.     mupirocin  ointment (BACTROBAN ) 2 % Apply 1 Application topically 2 (two) times daily. 22 g 0   naproxen sodium (ALEVE) 220 MG tablet Take 440 mg by mouth daily as needed (body aches).     NARCAN  4 MG/0.1ML LIQD nasal spray kit Place 0.4 mg into the nose as needed (opioid overdose).      nitroGLYCERIN  (NITROSTAT ) 0.4 MG SL tablet Place 0.4 mg under the tongue every 5 (five) minutes as needed for chest pain.      Omega-3 1000 MG CAPS Take 1,000 mg by mouth daily.      ondansetron  (ZOFRAN ) 4 MG tablet TAKE 1 TABLET(4 MG) BY MOUTH EVERY 8 HOURS AS NEEDED FOR NAUSEA OR VOMITING 60 tablet 3    oxyCODONE  (ROXICODONE ) 15 MG immediate release tablet Take 15 mg by mouth every 4 (four) hours as needed. :1 Tablet(s) By Mouth Every 4-6 Hours     OXYCONTIN  10 MG 12 hr tablet Take 10 mg by mouth every 12 (twelve) hours.     pantoprazole  (PROTONIX ) 40 MG tablet TAKE 1 TABLET(40 MG) BY MOUTH TWICE DAILY 180 tablet 4   Plecanatide  (TRULANCE ) 3 MG TABS Take 1 tablet by mouth daily. 15 tablet 0   polyethylene glycol (MIRALAX  / GLYCOLAX ) 17 g packet Take 17  g by mouth 2 (two) times daily. 60 each 12   pregabalin  (LYRICA ) 75 MG capsule Take 75 mg by mouth 2 (two) times daily.     senna-docusate (SENOKOT-S) 8.6-50 MG tablet Take 1 tablet by mouth 2 (two) times daily. 180 tablet 4   sucralfate  (CARAFATE ) 1 g tablet TAKE 1 TABLET BY MOUTH FOUR TIMES DAILY AT BEDTIME WITH MEALS 360 tablet 4   traZODone  (DESYREL ) 100 MG tablet Take 1 tablet (100 mg total) by mouth at bedtime as needed. for sleep 90 tablet 4   vitamin A  10000 UNIT capsule Take 10,000 Units by mouth daily.     vitamin B-12 (CYANOCOBALAMIN ) 1000 MCG tablet Take 1,000 mcg by mouth daily.     Vitamin E  400 units TABS Take 400 Units by mouth daily.      No current facility-administered medications for this visit.    Allergies as of 11/25/2023 - Review Complete 09/21/2023  Allergen Reaction Noted   Bee venom Anaphylaxis 02/22/2014   Crestor  [rosuvastatin  calcium ] Shortness Of Breath and Swelling 03/03/2017   Fentanyl  Itching and Hives 03/27/2015   Gabapentin  Diarrhea 02/22/2014   Shellfish allergy Anaphylaxis and Swelling 03/20/2014   Fire ant  01/05/2022   Furosemide  Nausea And Vomiting 01/01/2020   Buprenorphine hcl Itching 10/10/2015   Chlorhexidine  gluconate Itching and Rash 08/13/2021   Simvastatin Diarrhea 04/25/2015    Past Medical History:  Diagnosis Date   Allergy    Aortic atherosclerosis (HCC)    Asthma    C. difficile diarrhea    Chronic pain    Collagenous colitis    Coronary artery disease    a.) PCI with 2.75 x  18 mm Resolute Onyx DES x 1 to prox/mid LAD on 09/05/2019   DDD (degenerative disc disease), cervical    DDD (degenerative disc disease), lumbar    GERD (gastroesophageal reflux disease)    Grade I diastolic dysfunction    Hepatic steatosis    Hyperlipidemia    Hypertension    Liver cancer (HCC) 03/2015   Migraines    Myocardial infarction (HCC)     OSA on CPAP    Seizures (HCC)    several as child when sick.  None since age 74   Stroke (HCC)    'mini-stroke 30 yrs ago. no deficits.   T2DM (type 2 diabetes mellitus) (HCC)    Wears dentures    full upper and lower    Past Surgical History:  Procedure Laterality Date   APPENDECTOMY     BACK SURGERY     CARDIAC CATHETERIZATION     No stent placed in his 86's   CERVICAL FUSION     COLONOSCOPY WITH PROPOFOL  N/A 03/06/2016   Procedure: COLONOSCOPY WITH PROPOFOL ;  Surgeon: Rogelia Copping, MD;  Location: [facility];  Service: Endoscopy;  Laterality: N/A;  requests early   COLONOSCOPY WITH PROPOFOL  N/A 06/18/2020   Procedure: COLONOSCOPY WITH PROPOFOL ;  Surgeon: Copping Rogelia, MD;  Location: [facility];  Service: Endoscopy;  Laterality: N/A;   CORONARY PRESSURE/FFR STUDY N/A 09/05/2019   Procedure: INTRAVASCULAR PRESSURE WIRE/FFR STUDY;  Surgeon: Mady Bruckner, MD;  Location: [facility] IN[facility]IVE CV [facility];  Service: Cardiovascular;  Laterality: N/A;   ESOPHAGOGASTRODUODENOSCOPY (EGD) WITH PROPOFOL  N/A 09/20/2017   Procedure: ESOPHAGOGASTRODUODENOSCOPY (EGD) WITH PROPOFOL ;  Surgeon: Copping Rogelia, MD;  Location: [facility];  Service: Endoscopy;  Laterality: N/A;  Diabetic - oral meds   FINGER SURGERY Left    KNEE SURGERY Right    LEFT HEART CATH AND  CORONARY ANGIOGRAPHY Left 09/05/2019   Procedure: LEFT HEART CATH AND CORONARY ANGIOGRAPHY (2.75 x 18 mm Resolute Onyx DES x 1 to prox/mid LAD);  Surgeon: Mady Bruckner, MD;  Location: [facility] CV [facility];  Service: Cardiovascular;  Laterality: Left;   NECK SURGERY      spleen surgery     TOE SURGERY Right    TOTAL HIP ARTHROPLASTY Right 06/03/2021   Procedure: TOTAL HIP ARTHROPLASTY ANTERIOR APPROACH;  Surgeon: Kathlynn Sharper, MD;  Location: [facility] ORS;  Service: Orthopedics;  Laterality: Right;   TOTAL KNEE ARTHROPLASTY Right 08/22/2021   Procedure: TOTAL KNEE ARTHROPLASTY;  Surgeon: Kathlynn Sharper, MD;  Location: [facility] ORS;  Service: Orthopedics;  Laterality: Right;    Review of Systems:    All systems reviewed and negative except where noted in HPI.   Physical Examination:   There were no vitals taken for this visit.  General: Well-nourished, well-developed in no acute distress.  Lungs: Clear to auscultation bilaterally. Non-labored. Heart: Regular rate and rhythm, no murmurs rubs or gallops.  Abdomen: Bowel sounds are normal; Abdomen is Soft; No hepatosplenomegaly, masses or hernias;  No Abdominal Tenderness; No guarding or rebound tenderness. Neuro: Alert and oriented x 3.  Grossly intact.  Psych: Alert and cooperative, normal mood and affect.   Imaging Studies: No results found.  Assessment and Plan:   Araf Clugston. is a 68 y.o. y/o male presents for follow-up of:  1.  Opioid-induced constipation  2.  Generalized abdominal pain; recent abdominal pelvic CT and labs showed no evidence of current pancreatitis.  No acute abnormality.    [name], PA-C  Follow up ***  BP check ***

## 2023-11-25 ENCOUNTER — Ambulatory Visit: Payer: Medicare Other | Admitting: Physician Assistant

## 2023-11-25 NOTE — Telephone Encounter (Signed)
 Unable to leave message. VM either full or unavailable. Will try again later.

## 2023-11-26 NOTE — Telephone Encounter (Signed)
Due to multiple attempts trying to reach pt without being able to do so, per protocol encounter will be closed. 

## 2023-12-01 ENCOUNTER — Encounter: Payer: Self-pay | Admitting: Internal Medicine

## 2023-12-01 ENCOUNTER — Ambulatory Visit: Payer: Medicare Other | Admitting: Internal Medicine

## 2023-12-01 VITALS — BP 124/80 | HR 96 | Ht 68.0 in | Wt 237.0 lb

## 2023-12-01 DIAGNOSIS — E1165 Type 2 diabetes mellitus with hyperglycemia: Secondary | ICD-10-CM | POA: Diagnosis not present

## 2023-12-01 DIAGNOSIS — E1142 Type 2 diabetes mellitus with diabetic polyneuropathy: Secondary | ICD-10-CM

## 2023-12-01 DIAGNOSIS — Z794 Long term (current) use of insulin: Secondary | ICD-10-CM

## 2023-12-01 DIAGNOSIS — E1159 Type 2 diabetes mellitus with other circulatory complications: Secondary | ICD-10-CM | POA: Diagnosis not present

## 2023-12-01 DIAGNOSIS — Z7984 Long term (current) use of oral hypoglycemic drugs: Secondary | ICD-10-CM

## 2023-12-01 LAB — POCT GLYCOSYLATED HEMOGLOBIN (HGB A1C): Hemoglobin A1C: 7.5 % — AB (ref 4.0–5.6)

## 2023-12-01 NOTE — Progress Notes (Signed)
Name: Edward Schneider.  Age/ Sex: 68 y.o., male   MRN/ DOB: [id number], 1956-02-13     PCP: System, Provider Not In   Reason for Endocrinology Evaluation: Type 2 Diabetes Mellitus  Initial Endocrine Consultative Visit: 01/24/2019    PATIENT IDENTIFIER: [name]. is a 68 y.o. male with a past medical history of T2DM, pancreatitis 02/2022 while on Trulicity . The patient has followed with Endocrinology clinic since 01/24/2019 for consultative assistance with management of his diabetes.  DIABETIC HISTORY:  [name] was diagnosed with T2DM in 2017. He has been on Jardiance in 2017 but due to cost was discontinued, as well as [place]. His hemoglobin A1c has ranged from 6.2% in 2018, peaking at 10.0 % in 2019.  On his initial visit to our clinic his A1c 10.9% , he was on metformin which we stopped in 04/2019 due to diarrhea.    Trulicity was discontinued in May 2023 due to acute pancreatitis  S/P PCI with DES 08/2019   He was trained on OmniPod use 03/2023 but this was cost prohibitive    SUBJECTIVE:   During the last visit (08/23/2023): A1c 10.1%.     Today (12/01/2023): [name] is here for a follow up on his diabetes management. He checks his blood sugars 2-3 times daily through freestyle libre. The patient have has not had a hypoglycemic episodes since the last clinic visit .   Patient presented to the ED 09/2023 with abdominal pain, imaging was unrevealing, this was thought to be neuropathic or radicular in nature  Pain has resolved  Denies recent nausea or vomiting  He stopped all sodas and made dietary changes  Denies constipation or diarrhea   HOME DIABETES REGIMEN:  Basaglar 54 units QHS  Humalog 20 units TIDQAC Farxiga 10 mg daily  Correction factor: Humalog (BG -130/25)  CONTINUOUS GLUCOSE MONITORING RECORD INTERPRETATION    Dates of Recording: 1/30-2/09/2024  Sensor description: freestyle libre 3+  Results statistics:   CGM use % of time 97   Average and SD 158/24.2  Time in range     74   %  % Time Above 180 24  % Time above 250 2  % Time Below target 0   Glycemic patterns summary: BGs are optimal throughout the day and night  Hyperglycemic episodes postprandial  Hypoglycemic episodes occurred after bolus  Overnight periods: Upper limit of normal/high   DIABETIC COMPLICATIONS: Microvascular complications:  Neuropathy  Denies: CKD, retinopathy  Last eye exam: Completed 03/2022  Macrovascular complications:  CAD and CVA Denies: PVD    HISTORY:  Past Medical History:  Past Medical History:  Diagnosis Date   Allergy    Aortic atherosclerosis (HCC)    Asthma    C. difficile diarrhea    Chronic pain    Collagenous colitis    Coronary artery disease    a.) PCI with 2.75 x 18 mm Resolute Onyx DES x 1 to prox/mid LAD on 09/05/2019   DDD (degenerative disc disease), cervical    DDD (degenerative disc disease), lumbar    GERD (gastroesophageal reflux disease)    Grade I diastolic dysfunction    Hepatic steatosis    Hyperlipidemia    Hypertension    Liver cancer (HCC) 03/2015   Migraines    Myocardial infarction (HCC)     OSA on CPAP    Seizures (HCC)    several as child when sick.  None since age 74   Stroke [facility])    '  mini-stroke" 30 yrs ago. no deficits.   T2DM (type 2 diabetes mellitus) (HCC)    Wears dentures    full upper and lower   Past Surgical History:  Past Surgical History:  Procedure Laterality Date   APPENDECTOMY     BACK SURGERY     CARDIAC CATHETERIZATION     No stent placed in his "80's"   CERVICAL FUSION     COLONOSCOPY WITH PROPOFOL N/A 03/06/2016   Procedure: COLONOSCOPY WITH PROPOFOL;  Surgeon: Midge Minium, MD;  Location: [facility];  Service: Endoscopy;  Laterality: N/A;  requests early   COLONOSCOPY WITH PROPOFOL N/A 06/18/2020   Procedure: COLONOSCOPY WITH PROPOFOL;  Surgeon: Midge Minium, MD;  Location: [facility];  Service: Endoscopy;  Laterality: N/A;    CORONARY PRESSURE/FFR STUDY N/A 09/05/2019   Procedure: INTRAVASCULAR PRESSURE WIRE/FFR STUDY;  Surgeon: Yvonne Kendall, MD;  Location: [facility] INV[facility];  Service: Cardiovascular;  Laterality: N/A;   ESOPHAGOGASTRODUODENOSCOPY (EGD) WITH PROPOFOL N/A 09/20/2017   Procedure: ESOPHAGOGASTRODUODENOSCOPY (EGD) WITH PROPOFOL;  Surgeon: Midge Minium, MD;  Location: [facility];  Service: Endoscopy;  Laterality: N/A;  Diabetic - oral meds   FINGER SURGERY Left    KNEE SURGERY Right    LEFT HEART CATH AND CORONARY ANGIOGRAPHY Left 09/05/2019   Procedure: LEFT HEART CATH AND CORONARY ANGIOGRAPHY (2.75 x 18 mm Resolute Onyx DES x 1 to prox/mid LAD);  Surgeon: Yvonne Kendall, MD;  Location: [facility] CV [facility];  Service: Cardiovascular;  Laterality: Left;   NECK SURGERY     spleen surgery     TOE SURGERY Right    TOTAL HIP ARTHROPLASTY Right 06/03/2021   Procedure: TOTAL HIP ARTHROPLASTY ANTERIOR APPROACH;  Surgeon: Kennedy Bucker, MD;  Location: [facility] ORS;  Service: Orthopedics;  Laterality: Right;   TOTAL KNEE ARTHROPLASTY Right 08/22/2021   Procedure: TOTAL KNEE ARTHROPLASTY;  Surgeon: Kennedy Bucker, MD;  Location: [facility] ORS;  Service: Orthopedics;  Laterality: Right;   Social History:  reports that he quit smoking about 14 years ago. His smoking use included cigarettes. He started smoking about 64 years ago. He has a 100 pack-year smoking history. He has never used smokeless tobacco. He reports current drug use. He reports that he does not drink alcohol. Family History:  Family History  Problem Relation Age of Onset   Arthritis Mother    Diabetes Mother    Kidney disease Mother    Heart disease Mother    Hypertension Mother    Arthritis Father    Hearing loss Father    Hypertension Father    Heart disease Father    Diabetes Sister    Heart disease Sister    Diabetes Daughter    Diabetes Maternal Aunt    Diabetes Maternal Grandmother    Heart Problems Brother    Heart Problems  Brother    Heart Problems Brother    Heart attack Maternal Grandfather    Colon cancer Paternal Grandfather      HOME MEDICATIONS: Allergies as of 12/01/2023       Reactions   Bee Venom Anaphylaxis   Crestor [rosuvastatin Calcium] Shortness Of Breath, Swelling   Fentanyl Itching, Hives   blisters Patch   Gabapentin Diarrhea   Severe diarrhea which caused incontinence, loss of appetite and weight loss.   Shellfish Allergy Anaphylaxis, Swelling   Shrimp causes throat to swell and tingling in tongue.    Fire [employer]    Furosemide Nausea And Vomiting   Buprenorphine Hcl Itching  Chlorhexidine Gluconate Itching, Rash   Simvastatin Diarrhea        Medication List        Accurate as of December 01, 2023 11:20 AM. If you have any questions, ask your nurse or doctor.          Accu-Chek Guide test strip Generic drug: glucose blood Check blood sugar 2 times daily   Accu-Chek Softclix Lancets lancets Check blood sugar 2 times daily   acetaminophen 650 MG CR tablet Commonly known as: TYLENOL Take 1,300 mg by mouth every 8 (eight) hours.   albuterol 108 (90 Base) MCG/ACT inhaler Commonly known as: VENTOLIN HFA INHALE 2 PUFFS INTO THE LUNGS EVERY 6 HOURS AS NEEDED FOR WHEEZING OR SHORTNESS OF BREATH   aspirin EC 81 MG tablet Take 81 mg by mouth daily. Swallow whole.   atorvastatin 80 MG tablet Commonly known as: LIPITOR TAKE 1 TABLET(80 MG) BY MOUTH AT BEDTIME   benazepril 10 MG tablet Commonly known as: LOTENSIN Take 1 tablet (10 mg total) by mouth daily.   bisacodyl 5 MG EC tablet Commonly known as: Dulcolax Take 2 tablets (10 mg total) by mouth daily in the afternoon.   Breztri Aerosphere 160-9-4.8 MCG/ACT Aero Generic drug: Budeson-Glycopyrrol-Formoterol Inhale 2 puffs into the lungs in the morning and at bedtime.   calcium carbonate 600 MG Tabs tablet Commonly known as: OS-CAL Take 600 mg by mouth daily.   Centrum Silver 50+Men Tabs Take 1 tablet by  mouth daily.   cetirizine 10 MG tablet Commonly known as: ZYRTEC Take 1 tablet (10 mg total) by mouth daily.   CINNAMON PO Take 1,000 mg by mouth 2 (two) times daily.   clobetasol cream 0.05 % Commonly known as: TEMOVATE Apply 1 application topically 2 (two) times daily.   Comfort EZ Pen Needles 32G X 4 MM Misc Generic drug: Insulin Pen Needle Use to inject insulin 5 times daily   cyanocobalamin 1000 MCG tablet Commonly known as: VITAMIN B12 Take 1,000 mcg by mouth daily.   cyclobenzaprine 10 MG tablet Commonly known as: FLEXERIL Take 1 tablet (10 mg total) by mouth 3 (three) times daily as needed for muscle spasms.   dapagliflozin propanediol 10 MG Tabs tablet Commonly known as: Farxiga Take 1 tablet (10 mg total) by mouth daily before breakfast.   diclofenac Sodium 1 % Gel Commonly known as: VOLTAREN Apply 2 g topically 4 (four) times daily as needed (pain).   diphenhydrAMINE 25 mg capsule Commonly known as: BENADRYL Take 50 mg by mouth in the morning, at noon, and at bedtime.   doxepin 25 MG capsule Commonly known as: SINEQUAN Take 2 capsules (50 mg total) by mouth at bedtime.   DULoxetine 60 MG capsule Commonly known as: CYMBALTA TAKE 1 CAPSULE(60 MG) BY MOUTH AT BEDTIME   EPINEPHrine 0.3 mg/0.3 mL Soaj injection Commonly known as: EPI-PEN Inject 0.3 mg into the muscle as needed for anaphylaxis.   ezetimibe 10 MG tablet Commonly known as: ZETIA Take 1 tablet (10 mg total) by mouth daily.   famotidine 20 MG tablet Commonly known as: PEPCID TAKE 1 TABLET BY MOUTH TWICE DAILY   fluticasone 50 MCG/ACT nasal spray Commonly known as: FLONASE Place into both nostrils.   FreeStyle Libre 3 Plus Sensor Misc 1 Device by Other route every 14 (fourteen) days. Change sensor every 15 days.   GINGER PO Take 1 tablet by mouth daily.   Ginseng 100 MG Caps Take 100 mg by mouth daily.   isosorbide mononitrate 60  MG 24 hr tablet Commonly known as: IMDUR Take 1  tablet (60 mg total) by mouth daily.   ketorolac 10 MG tablet Commonly known as: TORADOL Take 10 mg by mouth every 8 (eight) hours.   Lantus 100 UNIT/ML injection Generic drug: insulin glargine Inject 54 Units into the skin at bedtime.   lidocaine 2 % solution Commonly known as: XYLOCAINE Use as directed 15 mLs in the mouth or throat as needed for mouth pain.   Minoxidil 5 % Foam Apply 1 g topically daily.   mometasone 0.1 % ointment Commonly known as: ELOCON Apply topically 2 (two) times daily as needed.   mupirocin ointment 2 % Commonly known as: BACTROBAN Apply 1 Application topically 2 (two) times daily.   naproxen sodium 220 MG tablet Commonly known as: ALEVE Take 440 mg by mouth daily as needed (body aches).   Narcan 4 MG/0.1ML Liqd nasal spray kit Generic drug: naloxone Place 0.4 mg into the nose as needed (opioid overdose).   nitroGLYCERIN 0.4 MG SL tablet Commonly known as: NITROSTAT Place 0.4 mg under the tongue every 5 (five) minutes as needed for chest pain.   Omega-3 1000 MG Caps Take 1,000 mg by mouth daily.   ondansetron 4 MG disintegrating tablet Commonly known as: ZOFRAN-ODT Take 2 mg by mouth every 6 (six) hours as needed.   ondansetron 4 MG tablet Commonly known as: ZOFRAN TAKE 1 TABLET(4 MG) BY MOUTH EVERY 8 HOURS AS NEEDED FOR NAUSEA OR VOMITING   OxyCONTIN 10 MG 12 hr tablet Generic drug: oxyCODONE Take 10 mg by mouth every 12 (twelve) hours.   oxyCODONE 15 MG immediate release tablet Commonly known as: ROXICODONE Take 15 mg by mouth every 4 (four) hours as needed. :1 Tablet(s) By Mouth Every 4-6 Hours   oxyCODONE 15 MG immediate release tablet Commonly known as: ROXICODONE 1 tablet Orally every 4 to 6 hrs for 30 days moderate pain   pantoprazole 40 MG tablet Commonly known as: PROTONIX TAKE 1 TABLET(40 MG) BY MOUTH TWICE DAILY   Pharmacist Choice Alcohol Pads SMARTSIG:1 Each Topical 4 Times Daily   polyethylene glycol 17 g  packet Commonly known as: MIRALAX / GLYCOLAX Take 17 g by mouth 2 (two) times daily.   pregabalin 75 MG capsule Commonly known as: LYRICA Take 75 mg by mouth 2 (two) times daily.   senna-docusate 8.6-50 MG tablet Commonly known as: Senokot-S Take 1 tablet by mouth 2 (two) times daily.   sucralfate 1 g tablet Commonly known as: CARAFATE TAKE 1 TABLET BY MOUTH FOUR TIMES DAILY AT BEDTIME WITH MEALS   traZODone 100 MG tablet Commonly known as: DESYREL Take 1 tablet (100 mg total) by mouth at bedtime as needed. for sleep   Trulance 3 MG Tabs Generic drug: Plecanatide Take 1 tablet by mouth daily.   vitamin A 16109 UNIT capsule Take 10,000 Units by mouth daily.   vitamin C 1000 MG tablet Take 1,000 mg by mouth daily.   Vitamin D3 25 MCG (1000 UT) Chew Chew 1,000 Units by mouth daily.   Vitamin E 400 units Tabs Take 400 Units by mouth daily.         OBJECTIVE:   Vital Signs: BP 124/80 (BP Location: Left Arm, Patient Position: Sitting, Cuff Size: Large)   Pulse 96   Ht 5\' 8"  (1.727 m)   Wt 237 lb (107.5 kg)   SpO2 97%   BMI 36.04 kg/m   Wt Readings from Last 3 Encounters:  12/01/23 237 lb (  107.5 kg)  09/20/23 198 lb (89.8 kg)  09/14/23 218 lb (98.9 kg)     Exam: General: Pt appears well and is in NAD  Lungs: Clear with good BS bilat with no rales, rhonchi, or wheezes  Heart: RRR   Extremities: No pretibial edema.    Neuro: MS is good with appropriate affect, pt is alert and Ox3       DM foot exam: 08/23/2023 The skin of the feet is without sores or ulcerations The pedal pulses are 1+ B/L The sensation is absent to a screening 5.07, 10 gram monofilament on the right          DATA REVIEWED:  Lab Results  Component Value Date   HGBA1C 7.5 (A) 12/01/2023   HGBA1C 10.1 (A) 08/23/2023   HGBA1C 10.6 (H) 02/02/2023     Latest Reference Range & Units 09/21/23 03:48  Sodium 135 - 145 mmol/L 133 (L)  Potassium 3.5 - 5.1 mmol/L 4.6  Chloride 98 -  111 mmol/L 103  CO2 22 - 32 mmol/L 21 (L)  Glucose 70 - 99 mg/dL 161 (H)  BUN 8 - 23 mg/dL 23  Creatinine 0.96 - 0.45 mg/dL 4.09  Calcium 8.9 - 81.1 mg/dL 8.4 (L)  Anion gap 5 - 15  9  GFR, Estimated >60 mL/min >60    ASSESSMENT / PLAN / RECOMMENDATIONS:   1) 1) Type 2 Diabetes Mellitus, Sub-Optimally  control, With neuropathic and macrovascular complications - Most recent A1c of  7.5 %. Goal A1c < 7.0 %.     -I have praised the patient on improving glycemic control, A1c down from 10.1% to 7.5% -He has made drastic dietary changes, he is motivated to continue -Trulicity had to be discontinued due to pancreatitis in May 2023, hence all GLP-1 agonist including Mounjaro are contraindicated -He is on patient assistance program for his basal, prandial and Farxiga -He did not qualify for the tandem pump, he was trained on the OmniPod but it was close prohibitive at $800 co-pay -No changes at this time  MEDICATIONS:  Continue Basaglar  54 units daily  Continue Humalog 20 units with each meal  Continue Farxiga 10 mg, 1 tablet with Breakfast  Continue correction factor: Humalog (BG -130/25)     EDUCATION / INSTRUCTIONS: BG monitoring instructions: Patient is instructed to check his blood sugars 4 times a day, before meals . Call [place] [facility] if: BG persistently < 70 I reviewed the Rule of 15 for the treatment of hypoglycemia in detail with the patient. Literature supplied.    F/U in  4 months    Signed electronically by: Lyndle Herrlich, MD  [facility] Endocrinology  [facility] [address] [place], [place] [address] Phone: [phone] FAX: [phone]   CC: System, Provider Not In No address on file Phone: None  Fax: None  Return to Endocrinology clinic as below: Future Appointments  Date Time Provider Department Center  12/01/2023 11:30 AM Loida Calamia, Konrad Dolores, MD [facility] None  01/24/2024 10:45 AM Tomma Lightning, MD [facility] None  02/03/2024  8:20 AM End, Cristal Deer, MD [facility] None

## 2023-12-01 NOTE — Patient Instructions (Addendum)
-   Continue Basaglar 54 units daily  - Continue Humalog 20 units with each meal  - Continue  Farxiga 10 mg , 1 tablet with Breakfast    -Humalog correctional insulin: ADD extra units on insulin to your meal-time Humalog dose if your blood sugars are higher than 155. Use the scale below to help guide you:   Blood sugar before meal Number of units to inject  Less than 155 0 unit  156 -  180 1 units  181 -  205 2 units  206 -  230 3 units  231 -  255 4 units  256 -  280 5 units  281 -  305 6 units  306 -  330 7 units  331 -  355 8 units    HOW TO TREAT LOW BLOOD SUGARS (Blood sugar LESS THAN 70 MG/DL) Please follow the RULE OF 15 for the treatment of hypoglycemia treatment (when your (blood sugars are less than 70 mg/dL)   STEP 1: Take 15 grams of carbohydrates when your blood sugar is low, which includes:  3-4 GLUCOSE TABS  OR 3-4 OZ OF JUICE OR REGULAR SODA OR ONE TUBE OF GLUCOSE GEL    STEP 2: RECHECK blood sugar in 15 MINUTES STEP 3: If your blood sugar is still low at the 15 minute recheck --> then, go back to STEP 1 and treat AGAIN with another 15 grams of carbohydrates.

## 2023-12-02 ENCOUNTER — Encounter: Payer: Self-pay | Admitting: Internal Medicine

## 2023-12-16 ENCOUNTER — Other Ambulatory Visit: Payer: Self-pay

## 2023-12-16 MED ORDER — FREESTYLE LIBRE 3 PLUS SENSOR MISC: 3 refills | Status: DC

## 2024-01-24 ENCOUNTER — Ambulatory Visit: Payer: Medicare Other | Admitting: Pulmonary Disease

## 2024-01-24 ENCOUNTER — Encounter: Payer: Self-pay | Admitting: Pulmonary Disease

## 2024-01-24 VITALS — BP 130/72 | HR 78 | Ht 68.0 in | Wt 245.6 lb

## 2024-01-24 DIAGNOSIS — G4730 Sleep apnea, unspecified: Secondary | ICD-10-CM | POA: Diagnosis not present

## 2024-01-24 DIAGNOSIS — J432 Centrilobular emphysema: Secondary | ICD-10-CM | POA: Diagnosis not present

## 2024-01-24 MED ORDER — ALBUTEROL SULFATE HFA 108 (90 BASE) MCG/ACT IN AERS: 2.0000 | INHALATION_SPRAY | Freq: Four times a day (QID) | RESPIRATORY_TRACT | 11 refills | Status: DC | PRN

## 2024-01-24 MED ORDER — BREZTRI AEROSPHERE 160-9-4.8 MCG/ACT IN AERO: 2.0000 | INHALATION_SPRAY | Freq: Two times a day (BID) | RESPIRATORY_TRACT | 5 refills | Status: DC
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
## 2024-01-24 NOTE — Patient Instructions (Addendum)
 We will contact [employer] to see what the neck step is to help you get your supplies  Order for CPAP supplies to DME  Prescription for Breztri and albuterol sent to pharmacy  I will see you back in about 3 months  Call us with significant concerns

## 2024-02-03 ENCOUNTER — Ambulatory Visit: Payer: Medicare Other | Admitting: Internal Medicine

## 2024-02-03 NOTE — Progress Notes (Deleted)
  Cardiology Office Note:  .   Date:  02/03/2024  ID:  Edward Oas., DOB Feb 09, 1956, MRN [id number] PCP: System, Provider Not In  [facility] Providers Cardiologist:  Sammy Crisp, MD { Click to update primary MD,subspecialty MD or APP then REFRESH:1}    History of Present Illness: .   Edward Schneider. is a 69 y.o. male with history of coronary artery disease status post remote PTCA (details unknown) and more recent PCI to the mid LAD (11/20) hypertension, "mini stroke," liver cancer, GERD, and asthma, who presents for follow-up of coronary artery disease.  He was last seen in our office in 01/2022 by Cadence Furth, PA, at which time he recounted a single episode of chest pain while helping his son move in [place] .  Further testing was deferred.  He was seen in the ED in 08/2023 with chest and abdominal pain.  Workup was reassuring, though he returned a few days later with recurrent abdominal pain complicated by near syncope.  ROS: See HPI  Studies Reviewed: .        *** Risk Assessment/Calculations:   {Does this patient have ATRIAL FIBRILLATION?[phone]} No BP recorded.  {Refresh Note OR Click here to enter BP  :1}***       Physical Exam:   VS:  There were no vitals taken for this visit.   Wt Readings from Last 3 Encounters:  01/24/24 245 lb 9.6 oz (111.4 kg)  12/01/23 237 lb (107.5 kg)  09/20/23 198 lb (89.8 kg)    General:  NAD. Neck: No JVD or HJR. Lungs: Clear to auscultation bilaterally without wheezes or crackles. Heart: Regular rate and rhythm without murmurs, rubs, or gallops. Abdomen: Soft, nontender, nondistended. Extremities: No lower extremity edema.  ASSESSMENT AND PLAN: .    ***    {Are you ordering a CV Procedure (e.g. stress test, cath, DCCV, TEE, etc)?   Press F2        :[phone]}  Dispo: ***  Signed, Sammy Crisp, MD

## 2024-02-11 ENCOUNTER — Telehealth: Payer: Self-pay

## 2024-02-11 NOTE — Telephone Encounter (Signed)
 Copied from CRM [id number]. Topic: General - Other >> Feb 10, 2024 12:17 PM Eveleen Hinds B wrote: Reason for CRM: Patient called in regarding CPAP supplies. Cannot use machine, out of supplies. Needs to talk to office about obtaining supplies. States Dr Gaynell Keeler said they would get the supplies and it's been a month. Please call. >> Feb 11, 2024 10:39 AM Hilton Lucky wrote: Patient is stating Genene Kennel has no information regarding this information except for a [employer]. Patient is requesting we call them at [phone] and then fax them information.   Spoke with patient regarding prior message . Advised patient that our Pcc's has reached out to [employer] and [facility] was reaching out to [place] .  Patient's voice was understanding.

## 2024-02-11 NOTE — Telephone Encounter (Signed)
 Brad at [employer] informed me that on 4/7 the order was sent through [facility] due to insurance had to go to them instead. Rodman Clam said Genene Kennel should have reached out to him but he was sending message to [place] team as well as Mitch to contact patient to get supplies. If he hears anything further he will call and let me know. [facility] should be reaching out to pt.

## 2024-02-11 NOTE — Telephone Encounter (Signed)
 Copied from CRM [id number]. Topic: General - Other >> Feb 10, 2024 12:17 PM Eveleen Hinds B wrote: Reason for CRM: Patient called in regarding CPAP supplies. Cannot use machine, out of supplies. Needs to talk to office about obtaining supplies. States Dr Gaynell Keeler said they would get the supplies and it's been a month. Please call. >> Feb 11, 2024 10:39 AM Hilton Lucky wrote: Patient is stating Genene Kennel has no information regarding this information except for a [employer]. Patient is requesting we call them at [phone] and then fax them information.

## 2024-02-11 NOTE — Telephone Encounter (Signed)
 Called [employer] and spoke with Brad. The [facility] team received the order on 4/9. Rodman Clam is reaching out to [facility] to get update on order and will call me back as soon as he hears back.

## 2024-02-11 NOTE — Telephone Encounter (Signed)
 Copied from CRM [id number]. Topic: General - Other >> Feb 10, 2024 12:17 PM Eveleen Hinds B wrote: Reason for CRM: Patient called in regarding CPAP supplies. Cannot use machine, out of supplies. Needs to talk to office about obtaining supplies. States Dr Gaynell Keeler said they would get the supplies and it's been a month. Please call.  Spoke with patient regarding prior message . Patient stated he has not got BIPAP supplies since last year and has not been using BIPAP. A order has been placed to [employer] on 01/24/2024. Patient still has not received a call or BIPAP supplies .   PCC's can you please check into this .

## 2024-02-14 ENCOUNTER — Telehealth: Payer: Self-pay

## 2024-02-14 NOTE — Telephone Encounter (Signed)
 Copied from CRM [id number]. Topic: General - Other >> Feb 10, 2024 12:17 PM Eveleen Hinds B wrote: Reason for CRM: Patient called in regarding CPAP supplies. Cannot use machine, out of supplies. Needs to talk to office about obtaining supplies. States Dr Gaynell Keeler said they would get the supplies and it's been a month. Please call. >> Feb 11, 2024 10:39 AM Hilton Lucky wrote: Patient is stating Genene Kennel has no information regarding this information except for a [employer]. Patient is requesting we call them at [phone] and then fax them information.   Spoke with patient regarding prior message . Patient stated he has still not got supplies for his CPAP machine . Patient stated he is unable to use his machine will send a message to [place].

## 2024-02-14 NOTE — Telephone Encounter (Signed)
 Spoke with [facility] regarding prior message . Per [employer] accepts [employer] for patient to get BIPAP supplies from . Patient has not used BIPAP patient is waiting for supplies .   PCC's can you send [facility] Sleep study,last office visit and order for patient to get supplies .    Thank you

## 2024-02-25 ENCOUNTER — Ambulatory Visit: Admitting: Medical

## 2024-02-28 NOTE — Telephone Encounter (Signed)
 Patient is calling about the cpap supplies that he still has not received . Patient says he calls the number he was given and no one never answers . Patient says its getting harder for him to breath and he needs the cpap supplies . Please reach out to patient concerning the cpap supplies  [phone]

## 2024-02-28 NOTE — Telephone Encounter (Signed)
 I have faxed the note from 01/24/24 and the referral order, all sleep studies to [facility]. I have left the patient a message letting him know the Bipap supply order has been faxed to [facility]. I also left [facility] phone number for him to call [phone]

## 2024-02-28 NOTE — Telephone Encounter (Signed)
 We have not received a CMN up front for this patient.

## 2024-02-29 ENCOUNTER — Telehealth (HOSPITAL_BASED_OUTPATIENT_CLINIC_OR_DEPARTMENT_OTHER): Payer: Self-pay

## 2024-02-29 NOTE — Telephone Encounter (Signed)
 I have faxed the note from 01/24/24 and the referral order, all sleep studies to [facility]. I have left the patient a message letting him know the Bipap supply order has been faxed to [facility]. I also left [facility] phone number for him to call [phone]      Note

## 2024-02-29 NOTE — Telephone Encounter (Signed)
 Status of order placed on 01/24/2024   Copied from CRM #[id number]. Topic: Clinical - Prescription Issue >> Feb 28, 2024  4:23 PM Isabell A wrote: Reason for CRM: Patient states there is nothing the company can do without an order for his CPAP supplies (he does not know the name of the company & states we should have it on file)

## 2024-03-09 ENCOUNTER — Encounter: Payer: Self-pay | Admitting: Nurse Practitioner

## 2024-03-09 ENCOUNTER — Other Ambulatory Visit: Payer: Self-pay | Admitting: Nurse Practitioner

## 2024-03-09 ENCOUNTER — Ambulatory Visit: Attending: Nurse Practitioner | Admitting: Nurse Practitioner

## 2024-03-09 VITALS — BP 126/76 | HR 75 | Ht 68.0 in | Wt 239.8 lb

## 2024-03-09 DIAGNOSIS — E785 Hyperlipidemia, unspecified: Secondary | ICD-10-CM | POA: Diagnosis not present

## 2024-03-09 DIAGNOSIS — I2510 Atherosclerotic heart disease of native coronary artery without angina pectoris: Secondary | ICD-10-CM

## 2024-03-09 DIAGNOSIS — I152 Hypertension secondary to endocrine disorders: Secondary | ICD-10-CM

## 2024-03-09 DIAGNOSIS — E119 Type 2 diabetes mellitus without complications: Secondary | ICD-10-CM | POA: Diagnosis not present

## 2024-03-09 DIAGNOSIS — E1159 Type 2 diabetes mellitus with other circulatory complications: Secondary | ICD-10-CM | POA: Diagnosis not present

## 2024-03-09 DIAGNOSIS — Z794 Long term (current) use of insulin: Secondary | ICD-10-CM
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
## 2024-03-09 NOTE — Patient Instructions (Signed)
 Medication Instructions:  Your physician recommends that you continue on your current medications as directed. Please refer to the Current Medication list given to you today.   *If you need a refill on your cardiac medications before your next appointment, please call your pharmacy*  Lab Work: No labs ordered today   Testing/Procedures: No test ordered today   Follow-Up: At [facility], you and your health needs are our priority.  As part of our continuing mission to provide you with exceptional heart care, our providers are all part of one team.  This team includes your primary Cardiologist (physician) and Advanced Practice Providers or APPs (Physician Assistants and Nurse Practitioners) who all work together to provide you with the care you need, when you need it.  Your next appointment:   1 year(s)  Provider:   Sammy Crisp, MD

## 2024-03-10 NOTE — Telephone Encounter (Signed)
 Unable to refill per protocol, courtesy refill already given, OV needed.  Requested Prescriptions  Pending Prescriptions Disp Refills   famotidine  (PEPCID ) 20 MG tablet [Pharmacy Med Name: FAMOTIDINE  20MG  TABLETS] 180 tablet 0    Sig: TAKE 1 TABLET BY MOUTH TWICE DAILY     Gastroenterology:  H2 Antagonists Failed - 03/10/2024 11:29 AM      Failed - Valid encounter within last 12 months    Recent Outpatient Visits   None

## 2024-03-22 ENCOUNTER — Ambulatory Visit
Admission: RE | Admit: 2024-03-22 | Discharge: 2024-03-22 | Disposition: A | Discharge status: Home / Self Care | Source: Ambulatory Visit | Attending: Family Medicine | Admitting: Family Medicine

## 2024-03-22 ENCOUNTER — Other Ambulatory Visit: Payer: Self-pay | Admitting: Family Medicine

## 2024-03-22 DIAGNOSIS — M25512 Pain in left shoulder: Secondary | ICD-10-CM | POA: Insufficient documentation

## 2024-03-27 ENCOUNTER — Telehealth: Payer: Self-pay

## 2024-03-27 NOTE — Telephone Encounter (Signed)
 Appointment time needed

## 2024-03-30 ENCOUNTER — Encounter: Payer: Self-pay | Admitting: Internal Medicine

## 2024-03-30 ENCOUNTER — Ambulatory Visit: Payer: Medicare Other | Admitting: Internal Medicine

## 2024-03-30 VITALS — BP 122/80 | HR 93 | Ht 68.0 in | Wt 236.0 lb

## 2024-03-30 DIAGNOSIS — Z7984 Long term (current) use of oral hypoglycemic drugs: Secondary | ICD-10-CM | POA: Diagnosis not present

## 2024-03-30 DIAGNOSIS — E1165 Type 2 diabetes mellitus with hyperglycemia: Secondary | ICD-10-CM | POA: Diagnosis not present

## 2024-03-30 DIAGNOSIS — E1159 Type 2 diabetes mellitus with other circulatory complications: Secondary | ICD-10-CM

## 2024-03-30 DIAGNOSIS — Z794 Long term (current) use of insulin: Secondary | ICD-10-CM | POA: Diagnosis not present

## 2024-03-30 LAB — POCT GLYCOSYLATED HEMOGLOBIN (HGB A1C): Hemoglobin A1C: 7.7 % — AB (ref 4.0–5.6)

## 2024-03-30 MED ORDER — FREESTYLE LIBRE 3 PLUS SENSOR MISC: 3 refills | Status: AC

## 2024-03-30 NOTE — Progress Notes (Signed)
 Name: Edward Schneider.  Age/ Sex: 68 y.o., male   MRN/ DOB: [id number], [birth year]     PCP: System, Provider Not In   Reason for Endocrinology Evaluation: Type 2 Diabetes Mellitus  Initial Endocrine Consultative Visit: 01/24/2019    PATIENT IDENTIFIER: [name]. is a 68 y.o. male with a past medical history of T2DM, pancreatitis 02/2022 while on Trulicity  . The patient has followed with Endocrinology clinic since 01/24/2019 for consultative assistance with management of his diabetes.  DIABETIC HISTORY:  [name] was diagnosed with T2DM in 2017. He has been on Jardiance  in 2017 but due to cost was discontinued, as well as januvia . His hemoglobin A1c has ranged from 6.2% in 2018, peaking at 10.0 % in 2019.  On his initial visit to our clinic his A1c 10.9% , he was on metformin  which we stopped in 04/2019 due to diarrhea.    Trulicity  was discontinued in May 2023 due to acute pancreatitis  S/P PCI with DES 08/2019   He was trained on OmniPod use 03/2023 but this was cost prohibitive    SUBJECTIVE:   During the last visit (12/01/2023): A1c 7.5%.     Today (03/30/2024): [name] is here for a follow up on his diabetes management. He checks his blood sugars 2-3 times daily through freestyle libre. The patient have has not had a hypoglycemic episodes since the last clinic visit .  Patient had a follow-up with cardiology for CAD, continues on medical treatment  Patient continues with weight loss Denies recent nausea or vomiting  Denies constipation or diarrhea  Has rare abdominal pain  No genital infection     HOME DIABETES REGIMEN:  Basaglar  54 units QHS  Humalog  20 units TIDQAC Farxiga  10 mg daily  Correction factor: Humalog  (BG -130/25)   CONTINUOUS GLUCOSE MONITORING RECORD INTERPRETATION    Dates of Recording: 5/30-6/09/2024  Sensor description: freestyle libre 3+  Results statistics:   CGM use % of time 96  Average and SD 160/22.6  Time in range    76 %  % Time Above 180 22  % Time above 250 2  % Time Below target 0   Glycemic patterns summary: BGs are optimal throughout the day and night  Hyperglycemic episodes postprandial  Hypoglycemic episodes occurred N/A  Overnight periods: Optimal   DIABETIC COMPLICATIONS: Microvascular complications:  Neuropathy  Denies: CKD, retinopathy  Last eye exam: Completed 03/2022  Macrovascular complications:  CAD and CVA Denies: PVD    HISTORY:  Past Medical History:  Past Medical History:  Diagnosis Date   Allergy    Aortic atherosclerosis (HCC)    Aortic valve sclerosis    a. 08/2021 Echo: EF 60-65%, no rwma, mild LVH, nl RV fxn, mildly dil RA, AoV sclerosis w/o stenosis.   Asthma    C. difficile diarrhea    Chronic pain    Collagenous colitis    Coronary artery disease    a. 08/2019 Cath/PCI: LM 60p/m (FFR 0.87-->2.75 x 18 Resolute Onyx DES). D1/2/3 nl, RI nl, LCX 20p/m, RCA/RPDA nl, RPAV 50. EF 55-65%.   DDD (degenerative disc disease), cervical    DDD (degenerative disc disease), lumbar    GERD (gastroesophageal reflux disease)    Grade I diastolic dysfunction    Hepatic steatosis    Hyperlipidemia    Hypertension    Liver cancer (HCC) 03/2015   Migraines    Myocardial infarction (HCC)     OSA on CPAP    Seizures (  HCC)    several as child when sick.  None since age 68   Stroke (HCC)    'mini-stroke 30 yrs ago. no deficits.   T2DM (type 2 diabetes mellitus) (HCC)    Wears dentures    full upper and lower   Past Surgical History:  Past Surgical History:  Procedure Laterality Date   APPENDECTOMY     BACK SURGERY     CARDIAC CATHETERIZATION     No stent placed in his 70's   CERVICAL FUSION     COLONOSCOPY WITH PROPOFOL  N/A 03/06/2016   Procedure: COLONOSCOPY WITH PROPOFOL ;  Surgeon: Marnee Sink, MD;  Location: [facility];  Service: Endoscopy;  Laterality: N/A;  requests early   COLONOSCOPY WITH PROPOFOL  N/A 06/18/2020   Procedure: COLONOSCOPY WITH  PROPOFOL ;  Surgeon: Marnee Sink, MD;  Location: [facility];  Service: Endoscopy;  Laterality: N/A;   CORONARY PRESSURE/FFR STUDY N/A 09/05/2019   Procedure: INTRAVASCULAR PRESSURE WIRE/FFR STUDY;  Surgeon: Sammy Crisp, MD;  Location: [facility];  Service: Cardiovascular;  Laterality: N/A;   ESOPHAGOGASTRODUODENOSCOPY (EGD) WITH PROPOFOL  N/A 09/20/2017   Procedure: ESOPHAGOGASTRODUODENOSCOPY (EGD) WITH PROPOFOL ;  Surgeon: Marnee Sink, MD;  Location: [facility];  Service: Endoscopy;  Laterality: N/A;  Diabetic - oral meds   FINGER SURGERY Left    KNEE SURGERY Right    LEFT HEART CATH AND CORONARY ANGIOGRAPHY Left 09/05/2019   Procedure: LEFT HEART CATH AND CORONARY ANGIOGRAPHY (2.75 x 18 mm Resolute Onyx DES x 1 to prox/mid LAD);  Surgeon: Sammy Crisp, MD;  Location: [facility];  Service: Cardiovascular;  Laterality: Left;   NECK SURGERY     spleen surgery     TOE SURGERY Right    TOTAL HIP ARTHROPLASTY Right 06/03/2021   Procedure: TOTAL HIP ARTHROPLASTY ANTERIOR APPROACH;  Surgeon: Molli Angelucci, MD;  Location: [facility];  Service: Orthopedics;  Laterality: Right;   TOTAL KNEE ARTHROPLASTY Right 08/22/2021   Procedure: TOTAL KNEE ARTHROPLASTY;  Surgeon: Molli Angelucci, MD;  Location: [facility];  Service: Orthopedics;  Laterality: Right;   Social History:  reports that he quit smoking about 14 years ago. His smoking use included cigarettes. He started smoking about 64 years ago. He has a 100 pack-year smoking history. He has never used smokeless tobacco. He reports current drug use. He reports that he does not drink alcohol. Family History:  Family History  Problem Relation Age of Onset   Arthritis Mother    Diabetes Mother    Kidney disease Mother    Heart disease Mother    Hypertension Mother    Arthritis Father    Hearing loss Father    Hypertension Father    Heart disease Father    Diabetes Sister    Heart disease Sister    Diabetes Daughter     Diabetes Maternal Aunt    Diabetes Maternal Grandmother    Heart Problems Brother    Heart Problems Brother    Heart Problems Brother    Heart attack Maternal Grandfather    Colon cancer Paternal Grandfather      HOME MEDICATIONS: Allergies as of 03/30/2024       Reactions   Bee Venom Anaphylaxis   Crestor  [rosuvastatin  Calcium ] Shortness Of Breath, Swelling   Fentanyl  Itching, Hives   blisters Patch   Gabapentin  Diarrhea   Severe diarrhea which caused incontinence, loss of appetite and weight loss.   Shellfish Allergy Anaphylaxis, Swelling   Shrimp causes throat to swell and tingling  in tongue.    Chlorhexidine     Other Reaction(s): itching /rash   Fire Ant    Furosemide  Nausea And Vomiting   Buprenorphine Hcl Itching   Chlorhexidine  Gluconate Itching, Rash   Simvastatin Diarrhea        Medication List        Accurate as of March 30, 2024 10:19 AM. If you have any questions, ask your nurse or doctor.          Accu-Chek Guide test strip Generic drug: glucose blood Check blood sugar 2 times daily   Accu-Chek Softclix Lancets lancets Check blood sugar 2 times daily   acetaminophen  650 MG CR tablet Commonly known as: TYLENOL  Take 1,300 mg by mouth every 8 (eight) hours.   Tylenol  8 Hour 650 MG CR tablet Generic drug: acetaminophen  2 tablets as needed Orally Twice a day For Pain   albuterol  108 (90 Base) MCG/ACT inhaler Commonly known as: VENTOLIN  HFA Inhale 2 puffs into the lungs every 6 (six) hours as needed for wheezing or shortness of breath.   aspirin  EC 81 MG tablet Take 81 mg by mouth daily. Swallow whole.   atorvastatin  80 MG tablet Commonly known as: LIPITOR  TAKE 1 TABLET(80 MG) BY MOUTH AT BEDTIME   Beet Root 500 MG Caps 1 tablet Orally twice a day Daily supplement/general wellness   benazepril  10 MG tablet Commonly known as: LOTENSIN  Take 1 tablet (10 mg total) by mouth daily.   bisacodyl  5 MG EC tablet Commonly known as:  Dulcolax Take 2 tablets (10 mg total) by mouth daily in the afternoon.   Breztri  Aerosphere 160-9-4.8 MCG/ACT Aero inhaler Generic drug: budesonide -glycopyrrolate -formoterol  Inhale 2 puffs into the lungs in the morning and at bedtime.   calcium  carbonate 600 MG Tabs tablet Commonly known as: OS-CAL Take 600 mg by mouth daily.   Centrum Silver 50+Men Tabs Take 1 tablet by mouth daily.   cetirizine  10 MG tablet Commonly known as: ZYRTEC  Take 1 tablet (10 mg total) by mouth daily.   Cinnamon  Plus Chromium [phone] MCG-MG Caps Generic drug: Chromium-Cinnamon  1 tablet Orally twice a day Daily supplement/metabolism support   CINNAMON  PO Take 1,000 mg by mouth 2 (two) times daily.   clobetasol  cream 0.05 % Commonly known as: TEMOVATE  Apply 1 application topically 2 (two) times daily.   Comfort EZ Pen Needles 32G X 4 MM Misc Generic drug: Insulin  Pen Needle Use to inject insulin  5 times daily   cyanocobalamin  1000 MCG tablet Commonly known as: VITAMIN B12 Take 1,000 mcg by mouth daily.   cyclobenzaprine  10 MG tablet Commonly known as: FLEXERIL  Take 1 tablet (10 mg total) by mouth 3 (three) times daily as needed for muscle spasms.   dapagliflozin  propanediol 10 MG Tabs tablet Commonly known as: Farxiga  Take 1 tablet (10 mg total) by mouth daily before breakfast.   diclofenac  Sodium 1 % Gel Commonly known as: VOLTAREN  Apply 2 g topically 4 (four) times daily as needed (pain).   diphenhydrAMINE  25 mg capsule Commonly known as: BENADRYL  Take 50 mg by mouth in the morning, at noon, and at bedtime.   doxepin  25 MG capsule Commonly known as: SINEQUAN  Take 2 capsules (50 mg total) by mouth at bedtime.   DULoxetine  60 MG capsule Commonly known as: CYMBALTA  TAKE 1 CAPSULE(60 MG) BY MOUTH AT BEDTIME   EPINEPHrine  0.3 mg/0.3 mL Soaj injection Commonly known as: EPI-PEN Inject 0.3 mg into the muscle as needed for anaphylaxis.   Excedrin  Tension Headache 500-65 MG Tabs per  tablet Generic drug: acetaminophen -caffeine  Take 1 tablet by mouth as needed.   ezetimibe  10 MG tablet Commonly known as: ZETIA  Take 1 tablet (10 mg total) by mouth daily.   famotidine  20 MG tablet Commonly known as: PEPCID  TAKE 1 TABLET BY MOUTH TWICE DAILY   Flaxseed Oil 1200 MG Caps Take 1 capsule by mouth daily.   fluticasone 50 MCG/ACT nasal spray Commonly known as: FLONASE Place into both nostrils.   FreeStyle Libre 3 Plus Sensor Misc Change sensor every 15 days.   gabapentin  600 MG tablet Commonly known as: NEURONTIN  1 tablet Orally Once a day for 30 day(s)   GINGER  PO Take 1 tablet by mouth daily.   Ginkgo Biloba 60 MG Tabs Take 1 tablet by mouth daily.   Ginseng 100 MG Caps Take 100 mg by mouth daily.   HumaLOG  KwikPen 100 UNIT/ML KwikPen Generic drug: insulin  lispro 10 units Subcutaneous five times a day before every snack and meal. Five times a day for diabetes   isosorbide  mononitrate 60 MG 24 hr tablet Commonly known as: IMDUR  Take 1 tablet (60 mg total) by mouth daily.   ketorolac  10 MG tablet Commonly known as: TORADOL  Take 10 mg by mouth every 8 (eight) hours.   Lantus  100 UNIT/ML injection Generic drug: insulin  glargine Inject 54 Units into the skin at bedtime.   lidocaine  2 % solution Commonly known as: XYLOCAINE  Use as directed 15 mLs in the mouth or throat as needed for mouth pain.   Minoxidil 5 % Foam Apply 1 g topically daily.   mometasone  0.1 % ointment Commonly known as: ELOCON  Apply topically 2 (two) times daily as needed.   mupirocin  ointment 2 % Commonly known as: BACTROBAN  Apply 1 Application topically 2 (two) times daily.   naproxen sodium 220 MG tablet Commonly known as: ALEVE Take 440 mg by mouth daily as needed (body aches).   Narcan  4 MG/0.1ML Liqd nasal spray kit Generic drug: naloxone  Place 0.4 mg into the nose as needed (opioid overdose).   nitroGLYCERIN  0.4 MG SL tablet Commonly known as: NITROSTAT  Place  0.4 mg under the tongue every 5 (five) minutes as needed for chest pain.   Omega-3 1000 MG Caps Take 1,000 mg by mouth daily.   ondansetron  4 MG tablet Commonly known as: ZOFRAN  TAKE 1 TABLET(4 MG) BY MOUTH EVERY 8 HOURS AS NEEDED FOR NAUSEA OR VOMITING   OxyCONTIN  10 MG 12 hr tablet Generic drug: oxyCODONE  Take 10 mg by mouth every 12 (twelve) hours.   oxyCODONE  15 MG immediate release tablet Commonly known as: ROXICODONE  Take 15 mg by mouth every 4 (four) hours as needed. :1 Tablet(s) By Mouth Every 4-6 Hours   pantoprazole  40 MG tablet Commonly known as: PROTONIX  TAKE 1 TABLET(40 MG) BY MOUTH TWICE DAILY   Pharmacist Choice Alcohol Pads SMARTSIG:1 Each Topical 4 Times Daily   polyethylene glycol 17 g packet Commonly known as: MIRALAX  / GLYCOLAX  Take 17 g by mouth 2 (two) times daily.   pregabalin  75 MG capsule Commonly known as: LYRICA  Take 75 mg by mouth 2 (two) times daily.   senna-docusate 8.6-50 MG tablet Commonly known as: Senokot-S Take 1 tablet by mouth 2 (two) times daily.   sucralfate  1 g tablet Commonly known as: CARAFATE  TAKE 1 TABLET BY MOUTH FOUR TIMES DAILY AT BEDTIME WITH MEALS   traZODone  100 MG tablet Commonly known as: DESYREL  Take 1 tablet (100 mg total) by mouth at bedtime as needed. for sleep   Trulance  3 MG  Tabs Generic drug: Plecanatide  Take 1 tablet by mouth daily.   vitamin A  10000 UNIT capsule Take 10,000 Units by mouth daily.   vitamin C  1000 MG tablet Take 1,000 mg by mouth daily.   Vitamin D3 25 MCG (1000 UT) Chew Chew 1,000 Units by mouth daily.   Vitamin E  400 units Tabs Take 400 Units by mouth daily.         OBJECTIVE:   Vital Signs: BP 122/80 (BP Location: Left Arm, Patient Position: Sitting, Cuff Size: Normal)   Pulse 93   Ht 5' 8 (1.727 m)   Wt 236 lb (107 kg)   SpO2 96%   BMI 35.88 kg/m   Wt Readings from Last 3 Encounters:  03/30/24 236 lb (107 kg)  03/09/24 239 lb 12.8 oz (108.8 kg)  01/24/24 245  lb 9.6 oz (111.4 kg)     Exam: General: Pt appears well and is in NAD  Lungs: Clear with good BS bilat with no rales, rhonchi, or wheezes  Heart: RRR   Extremities: No pretibial edema.    Neuro: MS is good with appropriate affect, pt is alert and Ox3       DM foot exam: 08/23/2023 The skin of the feet is without sores or ulcerations The pedal pulses are 1+ B/L The sensation is absent to a screening 5.07, 10 gram monofilament on the right          DATA REVIEWED:  Lab Results  Component Value Date   HGBA1C 7.7 (A) 03/30/2024   HGBA1C 7.5 (A) 12/01/2023   HGBA1C 10.1 (A) 08/23/2023     Latest Reference Range & Units 09/21/23 03:48  Sodium 135 - 145 mmol/L 133 (L)  Potassium 3.5 - 5.1 mmol/L 4.6  Chloride 98 - 111 mmol/L 103  CO2 22 - 32 mmol/L 21 (L)  Glucose 70 - 99 mg/dL 098 (H)  BUN 8 - 23 mg/dL 23  Creatinine 1.19 - 1.47 mg/dL 8.29  Calcium  8.9 - 10.3 mg/dL 8.4 (L)  Anion gap 5 - 15  9  GFR, Estimated >60 mL/min >60    ASSESSMENT / PLAN / RECOMMENDATIONS:   1) 1) Type 2 Diabetes Mellitus, Sub-Optimally  control, With neuropathic and macrovascular complications - Most recent A1c of  7.7 %. Goal A1c < 7.0 %.     -Trulicity  had to be discontinued due to pancreatitis in May 2023, hence all GLP-1 agonist including Mounjaro are contraindicated -He is on patient assistance program for his basal, prandial and Farxiga  -He did not qualify for the tandem pump, he was trained on the OmniPod but it was close prohibitive at $800 co-pay - I will increase his basal insulin  as below, annual updated form was faxed to [facility] care  MEDICATIONS:  Increase  Basaglar   56 units daily  Continue Humalog  20 units with each meal  Continue Farxiga  10 mg, 1 tablet with Breakfast  Continue correction factor: Humalog  (BG -130/25)     EDUCATION / INSTRUCTIONS: BG monitoring instructions: Patient is instructed to check his blood sugars 4 times a day, before meals . Call [place]  Endocrinology clinic if: BG persistently < 70 I reviewed the Rule of 15 for the treatment of hypoglycemia in detail with the patient. Literature supplied.    F/U in  6 months   I spent 25 minutes preparing to see the patient by review of recent labs, imaging and procedures, obtaining and reviewing separately obtained history, communicating with the patient/family or caregiver, ordering medications, tests or procedures,  and documenting clinical information in the EHR including the differential Dx, treatment, and any further evaluation and other management   Signed electronically by: Natale Bail, MD  [facility]  [facility] [address] [place] [place], [place] [address] Phone: [phone] FAX: [phone]   CC: System, Provider Not In No address on file Phone: None  Fax: None  Return to Endocrinology clinic as below: Future Appointments  Date Time Provider Department Center  04/28/2024 10:00 AM Margaretann Sharper, MD [facility] None

## 2024-03-30 NOTE — Patient Instructions (Signed)
-   Increase Basaglar  56 units daily  - Continue Humalog  20 units with each meal  - Continue  Farxiga  10 mg , 1 tablet with Breakfast    -Humalog  correctional insulin : ADD extra units on insulin  to your meal-time Humalog  dose if your blood sugars are higher than 155. Use the scale below to help guide you:   Blood sugar before meal Number of units to inject  Less than 155 0 unit  156 -  180 1 units  181 -  205 2 units  206 -  230 3 units  231 -  255 4 units  256 -  280 5 units  281 -  305 6 units  306 -  330 7 units  331 -  355 8 units    HOW TO TREAT LOW BLOOD SUGARS (Blood sugar LESS THAN 70 MG/DL) Please follow the RULE OF 15 for the treatment of hypoglycemia treatment (when your (blood sugars are less than 70 mg/dL)   STEP 1: Take 15 grams of carbohydrates when your blood sugar is low, which includes:  3-4 GLUCOSE TABS  OR 3-4 OZ OF JUICE OR REGULAR SODA OR ONE TUBE OF GLUCOSE GEL    STEP 2: RECHECK blood sugar in 15 MINUTES STEP 3: If your blood sugar is still low at the 15 minute recheck --> then, go back to STEP 1 and treat AGAIN with another 15 grams of carbohydrates.

## 2024-04-04 ENCOUNTER — Telehealth: Payer: Self-pay

## 2024-04-04 NOTE — Telephone Encounter (Signed)
 Copied from CRM [id number]. Topic: Clinical - Order For Equipment >> Apr 03, 2024  3:14 PM Edward Schneider wrote: Reason for CRM: patient called stating he received a phone call on earlier today and was informed clarification was needed for machine or supplies. Patient stated he needs breathing supplies as the place he used to get them is no longer ib business. Patietn stated he was expecting new supplies in April but he still has not gotten them,. Patient stated he does not need a machine, he needs supplies for his machine.  Spoke with patient regarding prior message. Advised patient I will call [facility] to help patient get his BIPAP supplies that patient has been waiting for since April 2025. Called [facility] regarding BIPAP supplies they said they have been trying to contact patient. [facility] stated that they needed a script a script has already sent in 01/2024. I was asked name of the machine and size of face mask . Let [facility] know to contact patient to ask patient. [facility] will contact patient to get other info .

## 2024-04-11 ENCOUNTER — Other Ambulatory Visit (HOSPITAL_COMMUNITY): Payer: Self-pay

## 2024-04-26 ENCOUNTER — Encounter: Payer: Self-pay | Admitting: Family Medicine

## 2024-04-26 ENCOUNTER — Ambulatory Visit (INDEPENDENT_AMBULATORY_CARE_PROVIDER_SITE_OTHER): Admitting: Family Medicine

## 2024-04-26 VITALS — BP 119/73 | HR 75 | Temp 98.1°F | Resp 18 | Ht 68.0 in | Wt 241.0 lb

## 2024-04-26 DIAGNOSIS — M961 Postlaminectomy syndrome, not elsewhere classified: Secondary | ICD-10-CM | POA: Diagnosis not present

## 2024-04-26 DIAGNOSIS — J432 Centrilobular emphysema: Secondary | ICD-10-CM | POA: Diagnosis not present

## 2024-04-26 DIAGNOSIS — Z1211 Encounter for screening for malignant neoplasm of colon: Secondary | ICD-10-CM | POA: Insufficient documentation

## 2024-04-26 DIAGNOSIS — I25118 Atherosclerotic heart disease of native coronary artery with other forms of angina pectoris: Secondary | ICD-10-CM

## 2024-04-26 DIAGNOSIS — I2510 Atherosclerotic heart disease of native coronary artery without angina pectoris: Secondary | ICD-10-CM

## 2024-04-26 DIAGNOSIS — I10 Essential (primary) hypertension: Secondary | ICD-10-CM | POA: Diagnosis not present

## 2024-04-26 NOTE — Assessment & Plan Note (Signed)
 He has an appointment with [facility] pulmonology but not certain if this is for sleep or COPD.  He is on Breztri .  Reports he walks to the mailbox and back and he is winded.  Denies any chest pain.

## 2024-04-26 NOTE — Assessment & Plan Note (Addendum)
 Followed by Dr. Mady has a PCI and then 09/08/2019 had PCI and DES to the LAD.  Denies having chest pains.  Takes Imdur  60 mg daily and aspirin  81 mg daily.  Advised he should not take any medication for ED because the consequences can be severe.  He is on Lipitor  80 mg a day.  Ask him for a fasting CBC, CMP and lipids
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
## 2024-04-26 NOTE — Assessment & Plan Note (Signed)
 Colonoscopy 04/04/2014 at [facility].  Colonoscopy 06/18/2020 Dr. Jinny, [facility]

## 2024-04-26 NOTE — Assessment & Plan Note (Signed)
 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  Goes to [facility] knee pain management and is on gabapentin  300 mg 3 times daily and oxycodone  15 mg IR 150 a month.

## 2024-04-27 ENCOUNTER — Ambulatory Visit

## 2024-04-28 ENCOUNTER — Ambulatory Visit: Admitting: Pulmonary Disease

## 2024-04-28 VITALS — BP 120/73 | HR 91

## 2024-04-28 DIAGNOSIS — Z87891 Personal history of nicotine dependence: Secondary | ICD-10-CM

## 2024-04-28 DIAGNOSIS — J431 Panlobular emphysema: Secondary | ICD-10-CM

## 2024-04-28 DIAGNOSIS — J449 Chronic obstructive pulmonary disease, unspecified: Secondary | ICD-10-CM | POA: Diagnosis not present

## 2024-04-28 DIAGNOSIS — G4730 Sleep apnea, unspecified: Secondary | ICD-10-CM

## 2024-04-28 DIAGNOSIS — R29898 Other symptoms and signs involving the musculoskeletal system: Secondary | ICD-10-CM

## 2024-04-28 NOTE — Patient Instructions (Signed)
 DME referral for new BiPAP  Last study was 02/10/2022 titrated to BiPAP 22/18  BiPAP supplies  Prescription to adapt  Continue using your Breztri  Continue albuterol  as needed  Continue graded activities as tolerated  Follow-up in about 6 months
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
## 2024-05-04 ENCOUNTER — Ambulatory Visit

## 2024-05-04 DIAGNOSIS — I10 Essential (primary) hypertension: Secondary | ICD-10-CM

## 2024-05-04 DIAGNOSIS — I2510 Atherosclerotic heart disease of native coronary artery without angina pectoris: Secondary | ICD-10-CM

## 2024-05-05 LAB — CBC WITH DIFFERENTIAL/PLATELET
Basophils Absolute: 0.1 x10E3/uL (ref 0.0–0.2)
Basos: 1 %
EOS (ABSOLUTE): 0.7 x10E3/uL — ABNORMAL HIGH (ref 0.0–0.4)
Eos: 7 %
Hematocrit: 40.3 % (ref 37.5–51.0)
Hemoglobin: 12.3 g/dL — ABNORMAL LOW (ref 13.0–17.7)
Immature Grans (Abs): 0.1 x10E3/uL (ref 0.0–0.1)
Immature Granulocytes: 1 %
Lymphocytes Absolute: 2.7 x10E3/uL (ref 0.7–3.1)
Lymphs: 26 %
MCH: 26.1 pg — ABNORMAL LOW (ref 26.6–33.0)
MCHC: 30.5 g/dL — ABNORMAL LOW (ref 31.5–35.7)
MCV: 85 fL (ref 79–97)
Monocytes Absolute: 1 x10E3/uL — ABNORMAL HIGH (ref 0.1–0.9)
Monocytes: 10 %
Neutrophils Absolute: 5.7 x10E3/uL (ref 1.4–7.0)
Neutrophils: 55 %
Platelets: 238 x10E3/uL (ref 150–450)
RBC: 4.72 x10E6/uL (ref 4.14–5.80)
RDW: 13.7 % (ref 11.6–15.4)
WBC: 10.3 x10E3/uL (ref 3.4–10.8)

## 2024-05-05 LAB — LIPID PANEL
Chol/HDL Ratio: 2.5 ratio (ref 0.0–5.0)
Cholesterol, Total: 123 mg/dL (ref 100–199)
HDL: 50 mg/dL (ref >39)
LDL Chol Calc (NIH): 44 mg/dL (ref 0–99)
Triglycerides: 181 mg/dL — ABNORMAL HIGH (ref 0–149)
VLDL Cholesterol Cal: 29 mg/dL (ref 5–40)

## 2024-05-05 LAB — COMPREHENSIVE METABOLIC PANEL WITH GFR
ALT: 32 IU/L (ref 0–44)
AST: 28 IU/L (ref 0–40)
Albumin: 4.3 g/dL (ref 3.9–4.9)
Alkaline Phosphatase: 63 IU/L (ref 44–121)
BUN/Creatinine Ratio: 23 (ref 10–24)
BUN: 22 mg/dL (ref 8–27)
Bilirubin Total: 0.5 mg/dL (ref 0.0–1.2)
CO2: 23 mmol/L (ref 20–29)
Calcium: 10.1 mg/dL (ref 8.6–10.2)
Chloride: 100 mmol/L (ref 96–106)
Creatinine, Ser: 0.96 mg/dL (ref 0.76–1.27)
Globulin, Total: 2.6 g/dL (ref 1.5–4.5)
Glucose: 222 mg/dL — ABNORMAL HIGH (ref 70–99)
Potassium: 5.2 mmol/L (ref 3.5–5.2)
Sodium: 138 mmol/L (ref 134–144)
Total Protein: 6.9 g/dL (ref 6.0–8.5)
eGFR: 87 mL/min/1.73 (ref >59)

## 2024-05-08 ENCOUNTER — Telehealth: Payer: Self-pay

## 2024-05-08 ENCOUNTER — Ambulatory Visit: Payer: Self-pay | Admitting: Family Medicine

## 2024-05-08 ENCOUNTER — Telehealth: Payer: Self-pay | Admitting: Internal Medicine

## 2024-05-08 NOTE — Telephone Encounter (Signed)
 Pt c/o of Chest Pain: STAT if active (IN THIS MOMENT) CP, including tightness, pressure, jaw pain, shoulder/upper arm/back pain, SOB, nausea, and vomiting.  1. Are you having CP right now (tightness, pressure, or discomfort)? Yes   2. Are you experiencing any other symptoms (ex. SOB, nausea, vomiting, sweating)? Sweating, lightheadedness   3. How long have you been experiencing CP? Week   4. Is your CP continuous or coming and going? Coming and going   5. Have you taken Nitroglycerin ? No, needs refill   6. If CP returns before callback, please consider calling 911. ?

## 2024-05-08 NOTE — Telephone Encounter (Signed)
 Patient will come by tomorrow and complete his portion of the [employer] application so it can be sent. That is all they need to process application .

## 2024-05-08 NOTE — Telephone Encounter (Signed)
 Copied from CRM [id number]. Topic: Clinical - Order For Equipment >> Apr 03, 2024  3:14 PM Edward Schneider wrote: Reason for CRM: patient called stating he received a phone call on earlier today and was informed clarification was needed for machine or supplies. Patient stated he needs breathing supplies as the place he used to get them is no longer ib business. Patietn stated he was expecting new supplies in April but he still has not gotten them,. Patient stated he does not need a machine, he needs supplies for his machine.  Patient has came into the office and has been seen on 04/28/2024.

## 2024-05-08 NOTE — Telephone Encounter (Signed)
 Advised patient to go to ED for CP, N, dizziness, sweating - pt does not have any more nitroglycerin  to take

## 2024-05-08 NOTE — Telephone Encounter (Signed)
 Called transferred to [place], [place]

## 2024-05-15 ENCOUNTER — Telehealth: Payer: Self-pay

## 2024-05-15 NOTE — Telephone Encounter (Signed)
Result letter mailed out.

## 2024-05-15 NOTE — Telephone Encounter (Signed)
 I am not seeing anything done on 7/23 what exactly do I need to do?

## 2024-05-15 NOTE — Telephone Encounter (Unsigned)
 Copied from CRM [id number]. Topic: Clinical - Order For Equipment >> May 15, 2024  1:18 PM Russell PARAS wrote: Reason for CRM:   [name], with [facility], is contacting clinic regarding request for new order to provide CPAP supplies. Verified the request was received and scanned in on 07/23, did not see a provider's sig so advised to contact clinic at a later time if they have not received the order.  FX#  [phone]  CB#  [phone]  >> May 17, 2024  3:49 PM Corean R wrote: Patient states he can't afford a new CPAP or to have it fixed and is wanting to let Dr. Neda know that he will be going without his CPAP. Please call patient back and advise.     Spoke with patient regarding prior message. Patient stated he can not afford the 20% copay . Patient was wondering if [facility] has a patient assistance program to help with the patient with CPAP machine and supplies .  PCC's can you help out with this .

## 2024-05-16 NOTE — Telephone Encounter (Signed)
 Per Arvella at [employer]- Recieved 05/03/2024 1015am. Order is in process. Its a [facility] ins and review is needed.

## 2024-05-16 NOTE — Telephone Encounter (Signed)
 I have sent a message to [employer] in regards to this. It has been received

## 2024-05-17 NOTE — Telephone Encounter (Signed)
 Copied from CRM [id number]. Topic: Clinical - Order For Equipment >> May 15, 2024  1:18 PM Russell PARAS wrote: Reason for CRM:   [name], with Edward Schneider, is contacting clinic regarding request for new order to provide CPAP supplies. Verified the request was received and scanned in on 07/23, did not see a provider's sig so advised to contact clinic at a later time if they have not received the order.  FX#  [phone]  CB#  [phone] >> May 17, 2024  3:49 PM Edward Schneider wrote: Patient states he can't afford a new CPAP or to have it fixed and is wanting to let Dr. Neda know that he will be going without his CPAP. Please call patient back and advise.

## 2024-05-18 ENCOUNTER — Telehealth: Payer: Self-pay | Admitting: Pulmonary Disease

## 2024-05-18 NOTE — Telephone Encounter (Signed)
 Spoke with pt looks like DME has been trying to get in touch with him to give him otions on his supplies. Unable to get new machine due to insurance. He said he just needed supplies. I told him to reach out to the company that had LVM for him on 7/29 in regards to his supplies. He states all the calls from unknown numbers go to VM. He is calling DME and voice was understanding

## 2024-05-18 NOTE — Telephone Encounter (Signed)
 Copied from CRM [id number]. Topic: Clinical - Order For Equipment >> May 15, 2024  1:18 PM Russell PARAS wrote: Reason for CRM:   [name], with [facility], is contacting clinic regarding request for new order to provide CPAP supplies. Verified the request was received and scanned in on 07/23, did not see a provider's sig so advised to contact clinic at a later time if they have not received the order.  FX#  [phone]  CB#  [phone] >> May 18, 2024  9:01 AM Leila C wrote: Patient [phone] is returning the office call from yesterday, regarding not having a cpap machine anymore and needing financial help. Please advise. Per [place], warm transferred to [place].  >> May 17, 2024  3:49 PM Corean R wrote: Patient states he can't afford a new CPAP or to have it fixed and is wanting to let Dr. Neda know that he will be going without his CPAP. Please call patient back and advise.

## 2024-05-19 NOTE — Telephone Encounter (Signed)
 CMN has been faxed back to [facility]. Orders were placed through [employer] and all further request that [facility] may need should be sent to [employer], for [employer] is able to assist with all information that is needed.   Nothing further needed at this time.

## 2024-05-29 ENCOUNTER — Telehealth: Payer: Self-pay

## 2024-05-29 NOTE — Telephone Encounter (Signed)
 Chart notes have been faxed to [facility] at [phone]

## 2024-05-30 ENCOUNTER — Telehealth: Payer: Self-pay

## 2024-05-30 NOTE — Telephone Encounter (Signed)
 CMN faxed back to [facility] 05/26/2024

## 2024-06-01 ENCOUNTER — Telehealth: Payer: Self-pay

## 2024-06-01 NOTE — Telephone Encounter (Signed)
 Copied from CRM [id number]. Topic: Clinical - Prescription Issue >> May 29, 2024  4:23 PM Joesph PARAS wrote: Reason for CRM: Vernell with [facility] is calling to request a detailed written prescription for patient's CPAP supplies, as they need the details for insurance coverage purposes. Can be faxed to [phone]. Documentation appears to have been received and scanned into pt chart on 07/23.   Requesting empty boxes and signature be on paperwork before faxing.  Paperwork has been faxed back . Nothing else further needed.

## 2024-06-06 ENCOUNTER — Telehealth: Payer: Self-pay

## 2024-06-06 NOTE — Telephone Encounter (Signed)
 Copied from CRM [id number]. Topic: Clinical - Medication Question >> Jun 06, 2024  9:42 AM Rozanna MATSU wrote: Reason for CRM: RACHEL WITH [facility] IS REQUESTING ANOTHER COPY OF DETAIL WRITTEN ORDER STATED THE THEY REC'D ON 08/13 WAS NOT CLEAR. CAN BE FAXED TO [phone]  Faxed order 2nd time to [facility]

## 2024-06-08 NOTE — Telephone Encounter (Signed)
 Aidien from [employer] states they need the pressure setting of the machine on the 2nd page Cb [phone]

## 2024-06-09 NOTE — Telephone Encounter (Signed)
 Called and spoke with [facility] regarding prior message . [facility] stated they has sent over a new fax to fill out for pressure setting for new BIPAP machine. Order will be faxed back to [facility] .  Nothing else further needed.

## 2024-06-13 ENCOUNTER — Encounter: Payer: Self-pay | Admitting: Family Medicine

## 2024-06-13 ENCOUNTER — Ambulatory Visit (INDEPENDENT_AMBULATORY_CARE_PROVIDER_SITE_OTHER): Admitting: Family Medicine

## 2024-06-13 VITALS — BP 108/69 | HR 90 | Temp 97.6°F | Resp 18 | Ht 68.0 in | Wt 239.0 lb

## 2024-06-13 DIAGNOSIS — F1120 Opioid dependence, uncomplicated: Secondary | ICD-10-CM | POA: Diagnosis not present

## 2024-06-13 DIAGNOSIS — M19019 Primary osteoarthritis, unspecified shoulder: Secondary | ICD-10-CM | POA: Insufficient documentation

## 2024-06-13 DIAGNOSIS — K219 Gastro-esophageal reflux disease without esophagitis: Secondary | ICD-10-CM | POA: Diagnosis not present

## 2024-06-13 DIAGNOSIS — M19012 Primary osteoarthritis, left shoulder: Secondary | ICD-10-CM | POA: Insufficient documentation

## 2024-06-13 DIAGNOSIS — F17210 Nicotine dependence, cigarettes, uncomplicated: Secondary | ICD-10-CM | POA: Insufficient documentation

## 2024-06-13 DIAGNOSIS — I25118 Atherosclerotic heart disease of native coronary artery with other forms of angina pectoris: Secondary | ICD-10-CM

## 2024-06-13 DIAGNOSIS — T819XXA Unspecified complication of procedure, initial encounter: Secondary | ICD-10-CM | POA: Insufficient documentation

## 2024-06-13 DIAGNOSIS — M47817 Spondylosis without myelopathy or radiculopathy, lumbosacral region: Secondary | ICD-10-CM | POA: Insufficient documentation

## 2024-06-13 DIAGNOSIS — F419 Anxiety disorder, unspecified: Secondary | ICD-10-CM | POA: Insufficient documentation

## 2024-06-13 DIAGNOSIS — C3490 Malignant neoplasm of unspecified part of unspecified bronchus or lung: Secondary | ICD-10-CM | POA: Insufficient documentation

## 2024-06-13 DIAGNOSIS — E1165 Type 2 diabetes mellitus with hyperglycemia: Secondary | ICD-10-CM | POA: Insufficient documentation

## 2024-06-13 DIAGNOSIS — I5189 Other ill-defined heart diseases: Secondary | ICD-10-CM | POA: Insufficient documentation

## 2024-06-13 DIAGNOSIS — K5903 Drug induced constipation: Secondary | ICD-10-CM

## 2024-06-13 DIAGNOSIS — G5600 Carpal tunnel syndrome, unspecified upper limb: Secondary | ICD-10-CM | POA: Insufficient documentation

## 2024-06-13 DIAGNOSIS — T402X5A Adverse effect of other opioids, initial encounter: Secondary | ICD-10-CM

## 2024-06-13 DIAGNOSIS — N200 Calculus of kidney: Secondary | ICD-10-CM | POA: Insufficient documentation

## 2024-06-13 DIAGNOSIS — C220 Liver cell carcinoma: Secondary | ICD-10-CM | POA: Insufficient documentation

## 2024-06-13 DIAGNOSIS — R202 Paresthesia of skin: Secondary | ICD-10-CM | POA: Insufficient documentation

## 2024-06-13 MED ORDER — OXYCODONE HCL 15 MG PO TABS: 15.0000 mg | ORAL_TABLET | ORAL | 0 refills | Status: DC | PRN

## 2024-06-13 MED ORDER — NITROGLYCERIN 0.4 MG SL SUBL: 0.4000 mg | SUBLINGUAL_TABLET | SUBLINGUAL | 3 refills | Status: AC | PRN

## 2024-06-13 MED ORDER — FAMOTIDINE 20 MG PO TABS: 20.0000 mg | ORAL_TABLET | Freq: Two times a day (BID) | ORAL | 3 refills | Status: AC

## 2024-06-13 NOTE — Progress Notes (Unsigned)
 Established Patient Office Visit  Subjective   Patient ID: Edward Schneider., male    DOB: 07-Oct-1956  Age: 68 y.o. MRN: [id number]  Chief Complaint  Patient presents with   Medical Management of Chronic Issues    HPI 68-yo gentleman with HTN, Mixed hyperlipidemia, CAD  (followed by Dr. Mady, remote MI and PCI and 09/08/2019 he had a PCI and a DES to the LAD), COPD and (Quit smoking 2010 and has approximately 100-pk-yr hx,  [place] [facility].  Does not qualify for LDCT screening),  DDD of lumbar and cervical spine (post laminectomy syndrome in pain management at [facility]).  Hx collagenous colitis treated with cholestyramine and Imodium,  GERD, DMT2 (on insulin  and followed by Endocrinology), OSA on CPAP, s/p right THR, followed by Derm.   Hospitalized with a high colonic blockage and LLQ pain.   He was experiencing 30 loose stools a day and LLQ pain which awaken him from sleep, constipation and overflow diarrhea.   CT scans were unremarkable.SABRA  He got IVF, Miralax  17 grams BID.  After several BM his pain abated.   He is supposed to have a FOLLOW-UP with [facility].  He is now using Miralax  every 3 days.  He has 5-6 BM a day now.    [facility] is closing.  He has a referral to [facility], Dr. Cleotilde for pain management in October.  He was taking oxycodone  15 mg IR #6 a day.  Dr. Daune decreased him to 5 a day and then a month later down to 4 a day.  He reports he cannot function on 4 OxyContin  15mg  a day.  He tried the fentanyl  patch and broke out in blisters.  He tried a steroid Dosepak and his blood sugar got in the 600s.  He wants to be put back on oxycodone  15 mg 6 p.o. daily.  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  While taking four 15 mg oxycodone  IR he spends most of his time in bed or in his easy chair because his back hurts too much for him to do anything else.  He is also on gabapentin  300 mg 3 times daily. He  tells me that he cannot walk to the mailbox and back because his lumbar spine throbs so badly.       Objective:     BP 108/69   Pulse 90   Temp 97.6 F (36.4 C) (Oral)   Resp 18   Ht 5' 8 (1.727 m)   Wt 239 lb (108.4 kg)   SpO2 93%   BMI 36.34 kg/m  {Vitals History (Optional):23777}  Physical Exam Vitals and nursing note reviewed.  Constitutional:      Appearance: Normal appearance.  HENT:     Head: Normocephalic and atraumatic.  Eyes:     Conjunctiva/sclera: Conjunctivae normal.  Cardiovascular:     Rate and Rhythm: Normal rate and regular rhythm.  Pulmonary:     Effort: Pulmonary effort is normal.     Breath sounds: Normal breath sounds.  Musculoskeletal:     Right lower leg: No edema.     Left lower leg: No edema.  Skin:    General: Skin is warm and dry.     Findings: No lesion (no atypical lesions on his back.).  Neurological:     Mental Status: He is alert and oriented to person, place, and time.  Psychiatric:  Mood and Affect: Mood normal.        Behavior: Behavior normal.        Thought Content: Thought content normal.        Judgment: Judgment normal.      {Perform Simple Foot Exam  Perform Detailed exam:1} {Insert foot Exam (Optional):30965}   No results found for any visits on 06/13/24.  {Labs (Optional):23779}  The ASCVD Risk score (Arnett DK, et al., 2019) failed to calculate for the following reasons:   Risk score cannot be calculated because patient has a medical history suggesting prior/existing ASCVD    Assessment & Plan:  There are no diagnoses linked to this encounter.   No follow-ups on file.    Franchot Pollitt K Macsen Nuttall, MD

## 2024-06-15 ENCOUNTER — Other Ambulatory Visit: Payer: Self-pay | Admitting: Family Medicine

## 2024-06-15 DIAGNOSIS — M5416 Radiculopathy, lumbar region: Secondary | ICD-10-CM

## 2024-06-15 DIAGNOSIS — K5903 Drug induced constipation: Secondary | ICD-10-CM | POA: Insufficient documentation

## 2024-06-15 NOTE — Assessment & Plan Note (Signed)
 He was taking cholestyramine for a collagenous colitis.  That in combination with his opioids caused colonic blockage.  He is no longer taking cholestyramine or Imodium.  Uses MiraLAX  every 3 days and is having 5-6 bowel movements a day.

## 2024-06-15 NOTE — Assessment & Plan Note (Signed)
 I advised that I do not want to do long-term pain management for him.  Will send referral to pain management for him.  In the meantime I will give him oxycodone  15 mg #5 daily.

## 2024-06-20 ENCOUNTER — Other Ambulatory Visit: Payer: Self-pay

## 2024-06-20 DIAGNOSIS — F1120 Opioid dependence, uncomplicated: Secondary | ICD-10-CM

## 2024-06-20 MED ORDER — OXYCODONE HCL 15 MG PO TABS: 15.0000 mg | ORAL_TABLET | ORAL | 0 refills | Status: DC | PRN

## 2024-06-20 NOTE — Telephone Encounter (Signed)
 Refill request is for controlled substances and has been forwarded to provider for review.

## 2024-06-20 NOTE — Telephone Encounter (Signed)
 Copied from CRM #[id number]. Topic: Clinical - Prescription Issue >> Jun 20, 2024  8:23 AM Berneda FALCON wrote: Reason for CRM: Pt states that this medication was prescribed by the hospital to take 2 every 4 hours. Not to take 1 every 4 hours, so he will need some before the time we have listed for him to get a renewal please.  oxyCODONE  (ROXICODONE ) 15 MG immediate release tablet  [id number] - [place], [place] - [address] AT [facility] OF [address] [place] [address] Phone: [phone] Fax: [phone] Hours: Not open 24 hours

## 2024-06-22 ENCOUNTER — Ambulatory Visit: Payer: Self-pay

## 2024-06-22 NOTE — Telephone Encounter (Signed)
 FYI Only or Action Required?: FYI only for provider.  Patient was last seen in primary care on 06/13/2024 by Ziglar, Susan K, MD.  Called Nurse Triage reporting Abdominal Pain.  Symptoms began today.  Interventions attempted: Rest, hydration, or home remedies.  Symptoms are: rapidly worsening.  Triage Disposition: Go to ED Now (Notify PCP)  Patient/caregiver understands and will follow disposition?: Yes   Reason for Disposition  [1] SEVERE pain AND [2] age > 60 years  Answer Assessment - Initial Assessment Questions 1. LOCATION: Where does it hurt?      Dead in the middle of abdomen around rib cage  2. RADIATION: Does the pain shoot anywhere else? (e.g., chest, back)     No  3. ONSET: When did the pain begin? (Minutes, hours or days ago)      12:15 today, getting sharper   4. SUDDEN: Gradual or sudden onset?     Sudden onset today  5. PATTERN Does the pain come and go, or is it constant?     Constant, hurts to touch  6. SEVERITY: How bad is the pain?  (e.g., Scale 1-10; mild, moderate, or severe)     Hurts so bad could cry  7. RECURRENT SYMPTOM: Have you ever had this type of stomach pain before? If Yes, ask: When was the last time? and What happened that time?      Was in ED with a 6 foot blockage in intestines, given medication, compliant with medication.  8. OTHER SYMPTOMS: Do you have any other symptoms? (e.g., back pain, diarrhea, fever, urination pain, vomiting)       Abdomen swelling, feet and hands swollen, tight to breath  Protocols used: Abdominal Pain - Male-A-AH  Copied from CRM #[id number]. Topic: Clinical - Red Word Triage >> Jun 22, 2024  4:05 PM Delon DASEN wrote: Red Word that prompted transfer to Nurse Triage: upper abdominal pain level 7- abdomen and limbs swelling

## 2024-06-23 ENCOUNTER — Ambulatory Visit: Payer: Self-pay

## 2024-06-23 NOTE — Telephone Encounter (Signed)
 FYI Only or Action Required?: Action required by provider: referral request.  Patient was last seen in primary care on 06/13/2024 by Ziglar, Devere POUR, MD.  Called Nurse Triage reporting Hospitalization Follow-up and Abdominal Pain.  Symptoms began yesterday.  Interventions attempted: Prescription medications: oxycodone .  Symptoms are: unchanged.  Triage Disposition: Call PCP Now  Patient/caregiver understands and will follow disposition?: Unsure     Copied from CRM [id number]. Topic: Clinical - Red Word Triage >> Jun 23, 2024  9:18 AM Tiffany S wrote: [facility] that prompted transfer to Nurse Triage: Abdomen pain Reason for Disposition  [1] Caller has URGENT question AND [2] triager unable to answer question  Answer Assessment - Initial Assessment Questions 1. MAIN CONCERN OR SYMPTOM:  What is your main concern right now? What question do you have? What's the main symptom you're worried about? (e.g., breathing difficulty, ankle swelling, weight gain.)     Abd pain 2. ONSET: When did the  sx  start?     See alternate nurse assessment 3. BETTER-SAME-WORSE: Are you getting better, staying the same, or getting worse compared to the day you were discharged?     I fell like I'm gonna throw up.. and my stomach is still swoll 4. HOSPITALIZATION: How long were you hospitalized? (e.g., days)     9/4- discharged on same day 5. DISCHARGE DIAGNOSIS:  What problem or disease were you hospitalized for?     I need an appt with a GI doctor 6. DISCHARGE DATE: What date were you discharged from the hospital?     9/4 7. DISCHARGE DOCTOR: Who is the main doctor taking care of you now?     See chart 8. DISCHARGE APPOINTMENT: Have you scheduled a follow-up discharge appointment with your doctor?     Does not have 9. DISCHARGE MEDICINES: Did the doctor (or NP/PA) who discharged you order any new medicines for you to use? If yes, have you filled the prescription and started taking  the medicine?      denies 10. PAIN: Is there any pain? If Yes, ask: How bad is it?  (Scale 0-10; or none, mild, moderate, severe)       7/10 Oxycodone  without relief 11. FEVER: Do you have a fever? If Yes, ask: What is it, how was it measured  and when did it start?       denies 12. OTHER SYMPTOMS: Do you have any other symptoms?       Stomach is swoll/pain    Pt was triaged yesterday and advised ED. Pt went to [facility] and stated they performed scans and discharged him home same day to F/U with PCP. Pt still experiencing pain and requesting GI consult. Of note, triager scheduled HFU appt as well.   Triager will forward encounter for Dr Ziglar 's office to review and advise. Patient verbalized understanding and is expecting call back from office for next steps. Triager also advised that if pt does not hear back from office, to go back to ED for further evaluation/treatment.  Protocols used: Post-Hospitalization Follow-up Call-A-AH

## 2024-06-28 ENCOUNTER — Ambulatory Visit (INDEPENDENT_AMBULATORY_CARE_PROVIDER_SITE_OTHER): Admitting: Family Medicine

## 2024-06-28 ENCOUNTER — Encounter: Payer: Self-pay | Admitting: Family Medicine

## 2024-06-28 VITALS — BP 106/59 | HR 80 | Temp 98.0°F | Resp 18 | Ht 68.0 in | Wt 245.0 lb

## 2024-06-28 DIAGNOSIS — R1084 Generalized abdominal pain: Secondary | ICD-10-CM | POA: Diagnosis not present

## 2024-06-28 DIAGNOSIS — F1120 Opioid dependence, uncomplicated: Secondary | ICD-10-CM

## 2024-06-28 MED ORDER — OXYCODONE HCL 15 MG PO TABS: 15.0000 mg | ORAL_TABLET | ORAL | 0 refills | Status: DC | PRN

## 2024-06-28 NOTE — Progress Notes (Signed)
 Established Patient Office Visit  Subjective   Patient ID: Edward Schneider., male    DOB: 07-Dec-1955  Age: 68 y.o. MRN: [id number]  Chief Complaint  Patient presents with   Hospitalization Follow-up    HPI Discussed the use of AI scribe software for clinical note transcription with the patient, who gave verbal consent to proceed.  History of Present Illness   Edward Schneider. is a 68 year old male with chronic pain and rheumatoid arthritis who presents with a request for pain medication management.  He has been experiencing chronic pain management issues following a recent hospitalization. Initially, he was prescribed Percocet, taking two every four hours, totaling 30 mg per dose,(instead of his usual dosage of 15mg )  to transition off Dilaudid  used during his hospital stay. He has since run out of medication, having taken his last dose this morning.  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 Despite taking four Percocet daily, he describes persistent pain and expresses difficulty managing his pain with the proposed reduction to three pills daily by a new pain clinic.  He recounts a recent extensive workup in the ER September 4th, including a CT scan of abdomen and pelvis, scrotum and testes US   and blood work, for abdominal symptoms. He reports that he had a blockage in August 2025.   Despite these investigations, he continues to feel unwell and experiences chest pain similar to previous episodes that required hospitalization.  He has been unable to refill his prescription early at [facility] due to pharmacy policies, despite attempts to contact them. He is seeking alternative pain management options, expressing willingness to travel if necessary.    ROS    Objective:     BP (!) 106/59 (BP Location: Left Arm, Patient Position: Sitting, Cuff Size: Normal)   Pulse 80   Temp 98 F (36.7 C) (Oral)    Resp 18   Ht 5' 8 (1.727 m)   Wt 245 lb (111.1 kg)   SpO2 94%   BMI 37.25 kg/m    Physical Exam Vitals reviewed.  Constitutional:      Appearance: Normal appearance.  HENT:     Head: Normocephalic.  Eyes:     General:        Right eye: No discharge.        Left eye: No discharge.  Cardiovascular:     Rate and Rhythm: Normal rate.  Pulmonary:     Effort: Pulmonary effort is normal.  Neurological:     Mental Status: He is alert and oriented to person, place, and time.  Psychiatric:        Mood and Affect: Mood normal.        Behavior: Behavior normal.        Thought Content: Thought content normal.        Judgment: Judgment normal.          No results found for any visits on 06/28/24.    The ASCVD Risk score (Arnett DK, et al., 2019) failed to calculate for the following reasons:   Risk score cannot be calculated because patient has a medical history suggesting prior/existing ASCVD    Assessment & Plan:  Generalized abdominal pain Assessment & Plan: Had a bowel blockage in August.  Reports abdominal pain very similar to that but ER evaluation on 9/4 was extensive and benign.  Will refer to Gastroenterology.  Orders: -  Ambulatory referral to Gastroenterology  Uncomplicated opioid dependence [facility]) Assessment & Plan: The hospital told him to take his oxycodone  30 mg 4 times a day instead of 15 mg 5 times a day.  As result he has run out early.  Called pharmacy and canceled the 06/30/2024 prescription and put in a prescription for today number 150  five mg hydrocodone  Orders: -     oxyCODONE  HCl; Take 1 tablet (15 mg total) by mouth every 4 (four) hours as needed. :1 Tablet(s) By Mouth Every 4-6 Hours  Dispense: 150 tablet; Refill: 0 -     Ambulatory referral to Pain Clinic     Return in about 3 months (around 09/27/2024).    Esme Durkin K Martin Smeal, MD

## 2024-06-28 NOTE — Assessment & Plan Note (Signed)
 The hospital told him to take his oxycodone  30 mg 4 times a day instead of 15 mg 5 times a day.  As result he has run out early.  Called pharmacy and canceled the 06/30/2024 prescription and put in a prescription for today number 150  five mg hydrocodone

## 2024-06-28 NOTE — Assessment & Plan Note (Signed)
 Had a bowel blockage in August.  Reports abdominal pain very similar to that but ER evaluation on 9/4 was extensive and benign.  Will refer to Gastroenterology.

## 2024-07-12 ENCOUNTER — Ambulatory Visit

## 2024-07-13 ENCOUNTER — Other Ambulatory Visit: Payer: Self-pay | Admitting: Family Medicine

## 2024-07-13 DIAGNOSIS — E1142 Type 2 diabetes mellitus with diabetic polyneuropathy: Secondary | ICD-10-CM

## 2024-07-13 NOTE — Telephone Encounter (Unsigned)
 Copied from CRM [id number]. Topic: Clinical - Medication Refill >> Jul 13, 2024 12:17 PM Emylou G wrote: Medication: DULoxetine  (CYMBALTA ) 60 MG capsule  Has the patient contacted their pharmacy? No (Agent: If no, request that the patient contact the pharmacy for the refill. If patient does not wish to contact the pharmacy document the reason why and proceed with request.) (Agent: If yes, when and what did the pharmacy advise?)  This is the patient's preferred pharmacy:  [id number] [place], [place] - [address] AT [facility] OF [address] [place] [address] [place] [address] Phone: [phone] Fax: [phone]  Is this the correct pharmacy for this prescription? Yes If no, delete pharmacy and type the correct one.   Has the prescription been filled recently? No  Is the patient out of the medication? Yes  Has the patient been seen for an appointment in the last year OR does the patient have an upcoming appointment? Yes  Can we respond through MyChart? Yes  Agent: Please be advised that Rx refills may take up to 3 business days. We ask that you follow-up with your pharmacy.

## 2024-07-13 NOTE — Telephone Encounter (Signed)
 Med refill

## 2024-07-14 ENCOUNTER — Ambulatory Visit: Payer: Self-pay

## 2024-07-14 MED ORDER — DULOXETINE HCL 60 MG PO CPEP: 60.0000 mg | ORAL_CAPSULE | Freq: Every evening | ORAL | 0 refills | Status: DC

## 2024-07-14 NOTE — Telephone Encounter (Signed)
 If contacting pt, please call instead of MyChart.   FYI Only or Action Required?: Action required by provider: update on patient condition.  Patient was last seen in primary care on 06/28/2024 by Ziglar, Susan K, MD.  Called Nurse Triage reporting Abdominal Pain.  Symptoms began several months ago.  Interventions attempted: Other: Multiple ED/PCP visits, waiting on GI appt to be scheduled.  Symptoms are: gradually worsening.  Triage Disposition: Go to ED Now (Notify PCP)  Patient/caregiver understands and will follow disposition?: No, refuses disposition Reason for Disposition  [1] SEVERE pain (e.g., excruciating) AND [2] present > 1 hour  [1] SEVERE pain AND [2] age > 60 years  Blood in bowel movements  (Exception: Blood on surface of BM with constipation.)  Answer Assessment - Initial Assessment Questions Previously in hospital for blockage. Has not heard from GI yet to schedule. States pain is the same but more frequent since visit 06/28/24. Patient also reports frequent stools (approximately 12 times per day) that are foamy but formed, blood is present when wiping. Denies diarrhea.   States he has had C. Diff 5 times, last time being 2 years ago and this isn't C. Diff. Believes that the hospital tested him at his last visit. Do not see recent fecal or breath testing in Epic for H. Pylori or C. Diff. Patient does not believe he has had H. Pylori testing.  Care advocate Christobal was also on the line is requesting assistance scheduling his colonoscopy as soon as possible, pt is willing to travel farther for earlier appt.   Patient refused ED, this RN made CAL CRM aware, states she will speak to provider. Patient did state that he will go to the ED if his symptoms worsen.  1. LOCATION: Where does it hurt?      Entire abdomen 2. RADIATION: Does the pain shoot anywhere else? (e.g., chest, back)     Denies 3. ONSET: When did the pain begin? (Minutes, hours or days ago)       Chronic x 3-4 months 4. SUDDEN: Gradual or sudden onset?     Sudden 5. PATTERN Does the pain come and go, or is it constant?     Comes and goes 6. SEVERITY: How bad is the pain?  (e.g., Scale 1-10; mild, moderate, or severe)     10/10 at worse 7. OTHER SYMPTOMS: Do you have any other symptoms? (e.g., back pain, diarrhea, fever, urination pain, vomiting)       States uses the restroom 12 times per day, foam in stool  Protocols used: Abdominal Pain - Male-A-AH Copied from CRM #[id number]. Topic: Clinical - Red Word Triage >> Jul 14, 2024 11:37 AM Mia F wrote: Red Word that prompted transfer to Nurse Triage: Pt is having severe stomach pains. He has been seen for the pains and waiting for a colonoscopy. He says his pains has gotten worse since he last seen the dr. He says he is using the bathroom about 12 times a day.    Pt wants to know if he is supposed to be taking the JARDIANCE  and the FARXIGA  together. They want to know which need to be stopped.

## 2024-07-14 NOTE — Telephone Encounter (Signed)
 E2C2 triage nurse called to inform PCP that called with complaints of 10/10 abd pain, and blood when wipes. Pt refuses recommendations of ED.

## 2024-07-17 ENCOUNTER — Encounter: Payer: Self-pay | Admitting: Family Medicine

## 2024-07-17 ENCOUNTER — Ambulatory Visit: Payer: Self-pay

## 2024-07-17 NOTE — Telephone Encounter (Signed)
 FYI Only or Action Required?: Action required by provider: request for appointment and information regarding colonoscopy and medications.  Patient was last seen in primary care on 06/28/2024 by Ziglar, Susan K, MD.  Called Nurse Triage reporting Abdominal Pain.  Symptoms began several months ago.  Interventions attempted: Other: Unknown.  Symptoms are: gradually worsening.  Triage Disposition: Call PCP When Office is Open  Patient/caregiver understands and will follow disposition?: Yes     Copied from CRM #[id number]. Topic: Clinical - Red Word Triage >> Jul 14, 2024 11:37 AM Mia F wrote: Red Word that prompted transfer to Nurse Triage: Pt is having severe stomach pains. He has been seen for the pains and waiting for a colonoscopy. He says his pains has gotten worse since he last seen the dr. He says he is using the bathroom about 12 times a day.    Pt wants to know if he is supposed to be taking the JARDIANCE  and the FARXIGA  together. They want to know which need to be stopped. >> Jul 17, 2024 12:24 PM Montie POUR wrote: Ubaldo or Candie did not hear anything back on Friday so she is calling back about the above.  Reason for Disposition  [1] Caller requesting NON-URGENT health information AND [2] PCP's office is the best resource  Answer Assessment - Initial Assessment Questions This RN spoke with care advocate Burnadette regarding symptoms, patient not available at time of call. Patient having abdominal pain for the last 2-3 months and is requesting a follow up appointment for ED visit and is waiting for a colonoscopy. Patient is supposed to follow up with GI but has not received a call. Patient has been having multiple bowel movements daily. Patient does not want to go back to the ED for symptoms and per care advocate, would like a call back regarding this request.     1. REASON FOR CALL: What is the main reason for your call? or How can I best help you?     Calling on bel 2.  SYMPTOMS : Do you have any symptoms?      Abdominal pain and diarrhea  3. OTHER QUESTIONS: Do you have any other questions?     Pt wants to know if he is supposed to be taking the JARDIANCE  and the FARXIGA  together.  Protocols used: Information Only Call - No Triage-A-AH

## 2024-07-25 ENCOUNTER — Other Ambulatory Visit: Payer: Self-pay | Admitting: Nurse Practitioner

## 2024-07-27 ENCOUNTER — Other Ambulatory Visit: Payer: Self-pay | Admitting: Family Medicine

## 2024-07-27 ENCOUNTER — Encounter: Payer: Self-pay | Admitting: Family Medicine

## 2024-07-27 ENCOUNTER — Ambulatory Visit: Admitting: Family Medicine

## 2024-07-27 VITALS — BP 117/71 | HR 75 | Temp 97.7°F | Resp 18 | Ht 68.0 in | Wt 250.0 lb

## 2024-07-27 DIAGNOSIS — E1165 Type 2 diabetes mellitus with hyperglycemia: Secondary | ICD-10-CM

## 2024-07-27 DIAGNOSIS — R1084 Generalized abdominal pain: Secondary | ICD-10-CM

## 2024-07-27 DIAGNOSIS — E1169 Type 2 diabetes mellitus with other specified complication: Secondary | ICD-10-CM

## 2024-07-27 DIAGNOSIS — M51369 Other intervertebral disc degeneration, lumbar region without mention of lumbar back pain or lower extremity pain: Secondary | ICD-10-CM

## 2024-07-27 DIAGNOSIS — Z7984 Long term (current) use of oral hypoglycemic drugs: Secondary | ICD-10-CM

## 2024-07-27 DIAGNOSIS — E785 Hyperlipidemia, unspecified: Secondary | ICD-10-CM

## 2024-07-27 DIAGNOSIS — Z23 Encounter for immunization: Secondary | ICD-10-CM

## 2024-07-27 LAB — POCT GLYCOSYLATED HEMOGLOBIN (HGB A1C): Hemoglobin A1C: 9 % — AB (ref 4.0–5.6)

## 2024-07-27 MED ORDER — METFORMIN HCL ER 500 MG PO TB24: 500.0000 mg | ORAL_TABLET | Freq: Every day | ORAL | 1 refills | Status: DC

## 2024-07-27 NOTE — Assessment & Plan Note (Signed)
 Is currently taking oxycodone  15 mg 4 times a day for pain.  He has another prescription for November.  Expect him to find a pain management clinic for a long-term administration of pain medicines.

## 2024-07-27 NOTE — Assessment & Plan Note (Signed)
 Reports abdominal pain improved when he stopped sucralfate .  Stopped MiraLAX  and bowel movements have decreased to about 10 a day.

## 2024-07-27 NOTE — Assessment & Plan Note (Signed)
 Ask him to stop eating grapefruit because he is on atorvastatin  80 mg.  Will check full panel labs next month

## 2024-07-27 NOTE — Assessment & Plan Note (Addendum)
 A1c today is 9.0%.  Current regimen Lantus  54 units daily, Farxiga  10 mg daily and Humalog  10 units at meals.   He has a very liberal diet.  Last 98 lasagna.  Also gets up in the middle of the night to eat.  Referral to nutritionist.  Start metformin  ER 500 mg 1 daily.  Follow-up monthly.

## 2024-07-27 NOTE — Progress Notes (Signed)
 Established Patient Office Visit  Subjective   Patient ID: Edward Schneider., male    DOB: [birth year]  Age: 68 y.o. MRN: [id number]  Chief Complaint  Patient presents with   Medical Management of Chronic Issues    HPI Discussed the use of AI scribe software for clinical note transcription with the patient, who gave verbal consent to proceed.  History of Present Illness   Edward Schneider. is a 68 year old male with diabetes who presents with difficulty managing blood sugar levels and seeking pain management options.  He is experiencing difficulty managing his diabetes, with blood sugar levels ranging between 200 and 300 mg/dL in the mornings. He has a history of poor dietary habits, including nighttime eating due to hunger. His diet includes foods such as potato chips, onion dip, and lasagna. He has a history of high blood sugar levels, with a previous reading of A1c 15.7%, which led to temporary vision loss. He has a family history of diabetes-related complications, including amputations in his grandmother and aunt, and is motivated to manage his diabetes to avoid similar outcomes. He has previously attended diabetes education.  His A1c today is 9.0%.  He is currently seeking a new pain management clinic after being turned down by a previous clinic that wanted to reduce his medication dosage and offer injections, which he cannot have. He is concerned about running out of pain medication this month and is actively searching for a new provider with the help of a Nurse Navigator.  He has a history of taking sucralfate  four times a day for stomach pain, which he has since stopped due to adverse effects. He also discontinued Miralax , which he was taking daily.  Since stopping these medications, he reports a reduction in stomach pain and bowel movement frequency.  He is currently taking Lipitor  for cholesterol management and has received his flu shot and shingles vaccine, which were administered  at a pharmacy.  He does eat grapefruit.       Objective:     BP 117/71 (BP Location: Left Arm, Patient Position: Sitting, Cuff Size: Normal)   Pulse 75   Temp 97.7 F (36.5 C) (Oral)   Resp 18   Ht 5' 8 (1.727 m)   Wt 250 lb (113.4 kg)   SpO2 94%   BMI 38.01 kg/m    Physical Exam Vitals and nursing note reviewed.  Constitutional:      Appearance: Normal appearance.  HENT:     Head: Normocephalic and atraumatic.  Eyes:     Conjunctiva/sclera: Conjunctivae normal.  Cardiovascular:     Rate and Rhythm: Normal rate and regular rhythm.  Pulmonary:     Effort: Pulmonary effort is normal.     Breath sounds: Normal breath sounds.  Musculoskeletal:     Right lower leg: No edema.     Left lower leg: No edema.  Skin:    General: Skin is warm and dry.  Neurological:     Mental Status: He is alert and oriented to person, place, and time.  Psychiatric:        Mood and Affect: Mood normal.        Behavior: Behavior normal.        Thought Content: Thought content normal.        Judgment: Judgment normal.          Results for orders placed or performed in visit on 07/27/24  POCT glycosylated hemoglobin (Hb A1C)  Result Value Ref  Range   Hemoglobin A1C 9.0 (A) 4.0 - 5.6 %   HbA1c POC (<> result, manual entry)     HbA1c, POC (prediabetic range)     HbA1c, POC (controlled diabetic range)        The ASCVD Risk score (Arnett DK, et al., 2019) failed to calculate for the following reasons:   Risk score cannot be calculated because patient has a medical history suggesting prior/existing ASCVD    Assessment & Plan:  Type 2 diabetes mellitus with hyperglycemia, without long-term current use of insulin  (HCC) Assessment & Plan: A1c today is 9.0%.  Current regimen Lantus  54 units daily, Farxiga  10 mg daily and Humalog  10 units at meals.   He has a very liberal diet.  Last 98 lasagna.  Also gets up in the middle of the night to eat.  Referral to nutritionist.  Start metformin   ER 500 mg 1 daily.  Follow-up monthly.  Orders: -     POCT glycosylated hemoglobin (Hb A1C) -     Amb ref to [facility] -     metFORMIN  HCl ER; Take 1 tablet (500 mg total) by mouth daily with breakfast.  Dispense: 30 tablet; Refill: 1  Immunization due -     Flu vaccine HIGH DOSE PF(Fluzone Trivalent)  Hyperlipidemia associated with type 2 diabetes mellitus (HCC) Assessment & Plan: Ask him to stop eating grapefruit because he is on atorvastatin  80 mg.  Will check full panel labs next month   Generalized abdominal pain Assessment & Plan: Reports abdominal pain improved when he stopped sucralfate .  Stopped MiraLAX  and bowel movements have decreased to about 10 a day.   Degeneration of intervertebral disc of lumbar region, unspecified whether pain present Assessment & Plan: Is currently taking oxycodone  15 mg 4 times a day for pain.  He has another prescription for November.  Expect him to find a pain management clinic for a long-term administration of pain medicines.      Return in about 4 weeks (around 08/24/2024).    Edward Schneider K Jeanette Rauth, MD

## 2024-08-01 ENCOUNTER — Telehealth: Payer: Self-pay

## 2024-08-01 NOTE — Telephone Encounter (Signed)
 Written prescription request received, signed by AO, faxed back.

## 2024-08-07 ENCOUNTER — Encounter: Payer: Self-pay | Admitting: Physician Assistant

## 2024-08-22 LAB — OPHTHALMOLOGY REPORT-SCANNED

## 2024-08-28 ENCOUNTER — Ambulatory Visit: Admitting: Family Medicine

## 2024-08-28 ENCOUNTER — Other Ambulatory Visit: Payer: Self-pay

## 2024-08-29 ENCOUNTER — Ambulatory Visit

## 2024-08-29 VITALS — BP 120/79 | Ht 68.0 in | Wt 253.0 lb

## 2024-08-29 DIAGNOSIS — Z0000 Encounter for general adult medical examination without abnormal findings: Secondary | ICD-10-CM

## 2024-08-29 NOTE — Progress Notes (Unsigned)
 I connected with  Edward Schneider. on 08/29/24 by a audio enabled telemedicine application and verified that I am speaking with the correct person using two identifiers.  Patient Location: Home  Provider Location: Home Office  Persons Participating in Visit: Patient.  I discussed the limitations of evaluation and management by telemedicine. The patient expressed understanding and agreed to proceed.  Vital Signs: Because this visit was a virtual/telehealth visit, some criteria may be missing or patient reported. Any vitals not documented were not able to be obtained and vitals that have been documented are patient reported.   This visit was performed by a medical professional under my direct supervision. I was immediately available for consultation/collaboration. I have reviewed and agree with the Annual Wellness Visit documentation.  Subjective:   Edward Schneider. is a 68 y.o. male who presents for a Medicare Annual Wellness Visit.  Allergies (verified) Bee pollen, Bee venom, Crestor  [rosuvastatin  calcium ], Fentanyl , Gabapentin , Shellfish allergy, Chlorhexidine , Fire ant, Furosemide , Buprenorphine hcl, Chlorhexidine  gluconate, and Simvastatin   History: Past Medical History:  Diagnosis Date   Allergy    Aortic atherosclerosis    Aortic valve sclerosis    a. 08/2021 Echo: EF 60-65%, no rwma, mild LVH, nl RV fxn, mildly dil RA, AoV sclerosis w/o stenosis.   Asthma    C. difficile diarrhea    Chronic pain    Collagenous colitis    Coronary artery disease    a. 08/2019 Cath/PCI: LM 60p/m (FFR 0.87-->2.75 x 18 Resolute Onyx DES). D1/2/3 nl, RI nl, LCX 20p/m, RCA/RPDA nl, RPAV 50. EF 55-65%.   DDD (degenerative disc disease), cervical    DDD (degenerative disc disease), lumbar    GERD (gastroesophageal reflux disease)    Grade I diastolic dysfunction    Hepatic steatosis    Hyperlipidemia    Hypertension    Liver cancer (HCC) 03/2015   Migraines    Myocardial infarction (HCC)      OSA on CPAP    Seizures (HCC)    several as child when sick.  None since age 51   Stroke (HCC)    'mini-stroke 30 yrs ago. no deficits.   T2DM (type 2 diabetes mellitus) (HCC)    Wears dentures    full upper and lower   Past Surgical History:  Procedure Laterality Date   APPENDECTOMY     BACK SURGERY     CARDIAC CATHETERIZATION     No stent placed in his 70's   CERVICAL FUSION     COLONOSCOPY WITH PROPOFOL  N/A 03/06/2016   Procedure: COLONOSCOPY WITH PROPOFOL ;  Surgeon: Rogelia Copping, MD;  Location: [facility];  Service: Endoscopy;  Laterality: N/A;  requests early   COLONOSCOPY WITH PROPOFOL  N/A 06/18/2020   Procedure: COLONOSCOPY WITH PROPOFOL ;  Surgeon: Copping Rogelia, MD;  Location: [facility];  Service: Endoscopy;  Laterality: N/A;   CORONARY PRESSURE/FFR STUDY N/A 09/05/2019   Procedure: INTRAVASCULAR PRESSURE WIRE/FFR STUDY;  Surgeon: Mady Bruckner, MD;  Location: [facility];  Service: Cardiovascular;  Laterality: N/A;   ESOPHAGOGASTRODUODENOSCOPY (EGD) WITH PROPOFOL  N/A 09/20/2017   Procedure: ESOPHAGOGASTRODUODENOSCOPY (EGD) WITH PROPOFOL ;  Surgeon: Copping Rogelia, MD;  Location: [facility];  Service: Endoscopy;  Laterality: N/A;  Diabetic - oral meds   FINGER SURGERY Left    KNEE SURGERY Schneider    LEFT HEART CATH AND CORONARY ANGIOGRAPHY Left 09/05/2019   Procedure: LEFT HEART CATH AND CORONARY ANGIOGRAPHY (2.75 x 18 mm Resolute Onyx DES x 1 to prox/mid LAD);  Surgeon: Mady Bruckner, MD;  Location: [facility];  Service: Cardiovascular;  Laterality: Left;   NECK SURGERY     spleen surgery     TOE SURGERY Schneider    TOTAL HIP ARTHROPLASTY Schneider 06/03/2021   Procedure: TOTAL HIP ARTHROPLASTY ANTERIOR APPROACH;  Surgeon: Kathlynn Sharper, MD;  Location: [facility] ORS;  Service: Orthopedics;  Laterality: Schneider;   TOTAL KNEE ARTHROPLASTY Schneider 08/22/2021   Procedure: TOTAL KNEE ARTHROPLASTY;  Surgeon: Kathlynn Sharper, MD;  Location: [facility];   Service: Orthopedics;  Laterality: Schneider;   Family History  Problem Relation Age of Onset   Arthritis Mother    Diabetes Mother    Kidney disease Mother    Heart disease Mother    Hypertension Mother    Arthritis Father    Hearing loss Father    Hypertension Father    Heart disease Father    Diabetes Sister    Heart disease Sister    Diabetes Daughter    Diabetes Maternal Aunt    Diabetes Maternal Grandmother    Heart Problems Brother    Heart Problems Brother    Heart Problems Brother    Heart attack Maternal Grandfather    Colon cancer Paternal Grandfather    Social History   Occupational History   Occupation: disability   Tobacco Use   Smoking status: Former    Current packs/day: 0.00    Average packs/day: 2.0 packs/day for 50.0 years (100.0 ttl pk-yrs)    Types: Cigarettes    Start date: 53    Quit date: 2011    Years since quitting: 14.8    Passive exposure: Past   Smokeless tobacco: Never  Vaping Use   Vaping status: Never Used  Substance and Sexual Activity   Alcohol use: No    Alcohol/week: 0.0 standard drinks of alcohol   Drug use: Yes    Comment: prescribed narcotics   Sexual activity: Not Currently   Tobacco Counseling Counseling given: Not Answered  SDOH Screenings   Food Insecurity: No Food Insecurity (08/29/2024)  Housing: Low Risk  (08/29/2024)  Transportation Needs: No Transportation Needs (08/29/2024)  Utilities: Not At Risk (08/29/2024)  Alcohol Screen: Low Risk  (07/08/2022)  Depression (PHQ2-9): Low Risk  (08/29/2024)  Recent Concern: Depression (PHQ2-9) - Medium Risk (07/27/2024)  Financial Resource Strain: Low Risk  (07/08/2022)  Physical Activity: Inactive (08/29/2024)  Social Connections: Moderately Isolated (08/29/2024)  Stress: No Stress Concern Present (07/08/2022)  Tobacco Use: Medium Risk (08/29/2024)  Health Literacy: Adequate Health Literacy (08/29/2024)   Depression Screen    08/29/2024    9:55 AM 07/27/2024    8:34 AM  06/28/2024    2:36 PM 04/26/2024   10:15 AM 07/28/2022   10:42 AM 07/08/2022    1:31 PM 07/08/2022    1:30 PM  PHQ 2/9 Scores  PHQ - 2 Score 0 0 3 2  0 0  PHQ- 9 Score 2 7  11  10    0  0   Exception Documentation     Patient refusal       Data saved with a previous flowsheet row definition     Goals Addressed               This Visit's Progress     DIET - INCREASE WATER  INTAKE   On track     Recommend drinking at least 6-8 glasses of water  a day       PharmD I want to stay healthy (pt-stated)  On track     Current Barriers:  Diabetes: uncontrolled but significantly improved, complicated by chronic medical conditions including atherosclerosis (s/p DES to LAD on 11/17), most recent A1c 7.9%; follows w/ Dr. Sam @ [facility] Patient calls today needing help navigating how to get Novolog  refill from [facility]. He reports he has ~1.5 pens left (~8 day supply) Current antihyperglycemic regimen: Tresiba  24 units daily, Novolog  16 units TID; Ozempic  0.5 weekly Hx GI intolerance to metformin  APPROVED for Tresiba , Novolog , and Ozempic  through 09/17/20 Cardiovascular risk reduction- s/p DES on 09/03/2019; follows w/ Dr. Mady; Current hypertensive regimen: benazepril  10 mg daily, isosorbide  90 mg daily Current hyperlipidemia regimen: atorvastatin  80 mg daily Current antiplatelet regimen: ASA 81 mg daily, clopidogrel  75 mg daily; plan for DAPT x 12 months, then clopidogrel  monotherapy indefinitely. COPD; Stiolto 2.5/2.5 mg daily. Albuterol  HFA PRN.  APPROVED for Stiolto through patient assistance through 10/18/20 Chronic pain- follows w/ Dr. Dannial Doxepin  25 mg BID, duloxetine  30 mg QAM, 30 mg Qafternoon, 60 mg QPM; pregabalin  50 mg TID, oxycodone  IR 15 mg Q4H; cyclobenzaprine  10 mg TID, diclofenac  gel PRN  Pharmacist Clinical Goal(s):  Over the next 90 days, patient will work with PharmD and primary care provider to address optimized medication  management  Interventions: Comprehensive medication review performed, medication list updated in electronic medical record Inter-disciplinary care team collaboration (see longitudinal plan of care) Communicated to Dr. Sam and CMA that they need to fax [facility] refill fax into [facility] for Novolog  Flexpen. Requested they send a prescription for Novolog  into [facility] now. Provided patient with instructions to access an Immediate Supply coupon from [facility] [phone]) while awaiting on his refill.   Patient Self Care Activities:  Patient will check blood glucose BID , document, and provide at future appointments Patient will take medications as prescribed Patient will contact provider with any episodes of hypoglycemia Patient will report any questions or concerns to provider   Please see past updates related to this goal by clicking on the Past Updates button in the selected goal         Visit info / Clinical Intake: Medicare Wellness Visit Type:: Subsequent Annual Wellness Visit Persons participating in visit:: patient Medicare Wellness Visit Mode:: Telephone If telephone:: video declined Because this visit was a virtual/telehealth visit:: pt reported vitals If Telephone or Video please confirm:: I connected with the patient using audio enabled telemedicine application and verified that I am speaking with the correct person using two identifiers; I discussed the limitations of evaluation and management by telemedicine; The patient expressed understanding and agreed to proceed Patient Location:: home Provider Location:: home office Information given by:: patient Interpreter Needed?: No Pre-visit prep was completed: yes AWV questionnaire completed by patient prior to visit?: no Living arrangements:: lives with spouse/significant other Patient's Overall Health Status Rating: very good Typical amount of pain: some Does pain affect daily life?: (!) yes Are you currently  prescribed opioids?: (!) yes  Dietary Habits and Nutritional Risks How many meals a day?: 3 Eats fruit and vegetables daily?: yes Most meals are obtained by: preparing own meals; eating out; having others provide food In the last 2 weeks, have you had any of the following?: none Diabetic:: (!) yes Any non-healing wounds?: no How often do you check your BS?: 4 Would you like to be referred to a Nutritionist or for Diabetic Management? : no  Functional Status Activities of Daily Living (to include ambulation/medication): Independent Ambulation: Independent with device- listed below Home Assistive  Devices/Equipment: Cane Medication Administration: Independent Home Management: Independent Manage your own finances?: yes Primary transportation is: driving Concerns about vision?: no *vision screening is required for WTM* Concerns about hearing?: no  Fall Screening Falls in the past year?: 0 Number of falls in past year: 0 Was there an injury with Fall?: 0 Fall Risk Category Calculator: 0 Patient Fall Risk Level: Low Fall Risk  Fall Risk Patient at Risk for Falls Due to: No Fall Risks Fall risk Follow up: Falls evaluation completed; Falls prevention discussed  Home and Transportation Safety: All rugs have non-skid backing?: yes All stairs or steps have railings?: yes Grab bars in the bathtub or shower?: yes Have non-skid surface in bathtub or shower?: yes Good home lighting?: yes Regular seat belt use?: yes Hospital stays in the last year:: no  Cognitive Assessment Difficulty concentrating, remembering, or making decisions? : no Will 6CIT or Mini Cog be Completed: no 6CIT or Mini Cog Declined: patient alert, oriented, able to answer questions appropriately and recall recent events  Advance Directives (For Healthcare) Does Patient Have a Medical Advance Directive?: No Would patient like information on creating a medical advance directive?: No - Patient  declined  Reviewed/Updated  Reviewed/Updated: Reviewed All (Medical, Surgical, Family, Medications, Allergies, Care Teams, Patient Goals)        Objective:    Today's Vitals   08/29/24 0946  BP: 120/79  Weight: 253 lb (114.8 kg)  Height: 5' 8 (1.727 m)   Body mass index is 38.47 kg/m.  Current Medications (verified) Outpatient Encounter Medications as of 08/29/2024  Medication Sig   Accu-Chek Softclix Lancets lancets Check blood sugar 2 times daily   acetaminophen  (TYLENOL  8 HOUR) 650 MG CR tablet 2 tablets as needed Orally Twice a day For Pain   acetaminophen  (TYLENOL ) 650 MG CR tablet Take 1,300 mg by mouth every 8 (eight) hours.   acetaminophen -caffeine  (EXCEDRIN  TENSION HEADACHE) 500-65 MG TABS per tablet Take 1 tablet by mouth as needed.   albuterol  (VENTOLIN  HFA) 108 (90 Base) MCG/ACT inhaler Inhale 2 puffs into the lungs every 6 (six) hours as needed for wheezing or shortness of breath.   Alcohol Swabs (PHARMACIST CHOICE ALCOHOL) PADS SMARTSIG:1 Each Topical 4 Times Daily   Ascorbic Acid  (VITAMIN C ) 1000 MG tablet Take 1,000 mg by mouth daily.    aspirin  EC 81 MG tablet Take 81 mg by mouth daily. Swallow whole.   atorvastatin  (LIPITOR ) 80 MG tablet TAKE 1 TABLET(80 MG) BY MOUTH AT BEDTIME   benazepril  (LOTENSIN ) 10 MG tablet Take 1 tablet (10 mg total) by mouth daily.   bisacodyl  (DULCOLAX) 5 MG EC tablet Take 2 tablets (10 mg total) by mouth daily in the afternoon.   budeson-glycopyrrolate -formoterol  (BREZTRI  AEROSPHERE) 160-9-4.8 MCG/ACT AERO Inhale 2 puffs into the lungs in the morning and at bedtime.   calcium  carbonate (OS-CAL) 600 MG TABS tablet Take 600 mg by mouth daily.    cetirizine  (ZYRTEC ) 10 MG tablet Take 1 tablet (10 mg total) by mouth daily.   Cholecalciferol  (VITAMIN D3) 25 MCG (1000 UT) CHEW Chew 1,000 Units by mouth daily.    Chromium-Cinnamon  (CINNAMON  PLUS CHROMIUM) [phone] MCG-MG CAPS 1 tablet Orally twice a day Daily supplement/metabolism support    CINNAMON  PO Take 1,000 mg by mouth 2 (two) times daily.    clobetasol  cream (TEMOVATE ) 0.05 % Apply 1 application topically 2 (two) times daily.   COMFORT EZ PEN NEEDLES 32G X 4 MM MISC Use to inject insulin  5 times daily   Continuous  Glucose Sensor (FREESTYLE LIBRE 3 PLUS SENSOR) MISC Change sensor every 15 days.   cyclobenzaprine  (FLEXERIL ) 10 MG tablet Take 1 tablet (10 mg total) by mouth 3 (three) times daily as needed for muscle spasms.   dapagliflozin  propanediol (FARXIGA ) 10 MG TABS tablet Take 1 tablet (10 mg total) by mouth daily before breakfast.   diclofenac  Sodium (VOLTAREN ) 1 % GEL Apply 2 g topically 4 (four) times daily as needed (pain).    diphenhydrAMINE  (BENADRYL ) 25 mg capsule Take 50 mg by mouth in the morning, at noon, and at bedtime.   doxepin  (SINEQUAN ) 25 MG capsule Take 2 capsules (50 mg total) by mouth at bedtime.   DULoxetine  (CYMBALTA ) 60 MG capsule Take 1 capsule (60 mg total) by mouth at bedtime.   EPINEPHrine  0.3 mg/0.3 mL IJ SOAJ injection Inject 0.3 mg into the muscle as needed for anaphylaxis.   ezetimibe  (ZETIA ) 10 MG tablet Take 1 tablet (10 mg total) by mouth daily.   famotidine  (PEPCID ) 20 MG tablet Take 1 tablet (20 mg total) by mouth 2 (two) times daily.   Flaxseed, Linseed, (FLAXSEED OIL) 1200 MG CAPS Take 1 capsule by mouth daily.   fluticasone (FLONASE) 50 MCG/ACT nasal spray Place into both nostrils.   gabapentin  (NEURONTIN ) 300 MG capsule Take 300 mg by mouth 3 (three) times daily.   Ginkgo Biloba 60 MG TABS Take 1 tablet by mouth daily.   Ginseng 100 MG CAPS Take 100 mg by mouth daily.    glucose blood (ACCU-CHEK GUIDE) test strip Check blood sugar 2 times daily   insulin  lispro (HUMALOG  KWIKPEN) 100 UNIT/ML KwikPen 10 units Subcutaneous five times a day before every snack and meal. Five times a day for diabetes   isosorbide  mononitrate (IMDUR ) 60 MG 24 hr tablet Take 1 tablet (60 mg total) by mouth daily.   LANTUS  100 UNIT/ML injection Inject 54  Units into the skin at bedtime.   lidocaine  (XYLOCAINE ) 2 % solution Use as directed 15 mLs in the mouth or throat as needed for mouth pain.   metFORMIN  (GLUCOPHAGE -XR) 500 MG 24 hr tablet Take 1 tablet (500 mg total) by mouth daily with breakfast.   Minoxidil 5 % FOAM Apply 1 g topically daily.   Misc Natural Products (BEET ROOT) 500 MG CAPS 1 tablet Orally twice a day Daily supplement/general wellness   mometasone  (ELOCON ) 0.1 % ointment Apply topically 2 (two) times daily as needed.   Multiple Vitamins-Minerals (CENTRUM SILVER 50+MEN) TABS Take 1 tablet by mouth daily.   mupirocin  ointment (BACTROBAN ) 2 % Apply 1 Application topically 2 (two) times daily.   NARCAN  4 MG/0.1ML LIQD nasal spray kit Place 0.4 mg into the nose as needed (opioid overdose).    nitroGLYCERIN  (NITROSTAT ) 0.4 MG SL tablet Place 1 tablet (0.4 mg total) under the tongue every 5 (five) minutes as needed for chest pain.   Omega-3 1000 MG CAPS Take 1,000 mg by mouth daily.    ondansetron  (ZOFRAN ) 4 MG tablet TAKE 1 TABLET(4 MG) BY MOUTH EVERY 8 HOURS AS NEEDED FOR NAUSEA OR VOMITING   oxyCODONE  (ROXICODONE ) 15 MG immediate release tablet Take 1 tablet (15 mg total) by mouth every 4 (four) hours as needed. :1 Tablet(s) By Mouth Every 4-6 Hours   oxyCODONE  (ROXICODONE ) 15 MG immediate release tablet Take 1 tablet (15 mg total) by mouth every 4 (four) hours as needed. :1 Tablet(s) By Mouth Every 4-6 Hours   oxyCODONE  (ROXICODONE ) 15 MG immediate release tablet Take 1 tablet (15 mg  total) by mouth every 4 (four) hours as needed. :1 Tablet(s) By Mouth Every 4-6 Hours   pantoprazole  (PROTONIX ) 40 MG tablet TAKE 1 TABLET(40 MG) BY MOUTH TWICE DAILY   polyethylene glycol (MIRALAX  / GLYCOLAX ) 17 g packet Take 17 g by mouth 2 (two) times daily.   senna-docusate (SENOKOT-S) 8.6-50 MG tablet Take 1 tablet by mouth 2 (two) times daily.   sucralfate  (CARAFATE ) 1 g tablet TAKE 1 TABLET BY MOUTH FOUR TIMES DAILY WITH MEALS AND AT BEDTIME    traZODone  (DESYREL ) 100 MG tablet Take 1 tablet (100 mg total) by mouth at bedtime as needed. for sleep   triamcinolone  (KENALOG ) 0.025 % cream Apply 1 Application topically 2 (two) times daily.   vitamin A  10000 UNIT capsule Take 10,000 Units by mouth daily.   vitamin B-12 (CYANOCOBALAMIN ) 1000 MCG tablet Take 1,000 mcg by mouth daily.   Vitamin E  400 units TABS Take 400 Units by mouth daily.    No facility-administered encounter medications on file as of 08/29/2024.   Hearing/Vision screen Hearing Screening - Comments:: No difficulties  Vision Screening - Comments:: Wears glasses Immunizations and Health Maintenance Health Maintenance  Topic Date Due   Diabetic kidney evaluation - Urine ACR  07/09/2023   Medicare Annual Wellness (AWV)  07/09/2023   FOOT EXAM  08/22/2024   HEMOGLOBIN A1C  01/25/2025   Diabetic kidney evaluation - eGFR measurement  05/04/2025   Colonoscopy  06/18/2025   OPHTHALMOLOGY EXAM  08/22/2025   DTaP/Tdap/Td (3 - Td or Tdap) 04/25/2028   Pneumococcal Vaccine: 50+ Years  Completed   Influenza Vaccine  Completed   Zoster Vaccines- Shingrix  Completed   Hepatitis C Screening  Addressed   Meningococcal B Vaccine  Aged Out   Lung Cancer Screening  Discontinued   Hepatitis B Vaccines 19-59 Average Risk  Discontinued   COVID-19 Vaccine  Discontinued        Assessment/Plan:  This is a routine wellness examination for Edward Schneider.  Patient Care Team: Ziglar, Susan K, MD as PCP - General (Family Medicine) End, Lonni, MD as PCP - Cardiology (Cardiology) [facility], Donell Cardinal, MD as Consulting Physician (Endocrinology) Patel, Donika K, DO as Consulting Physician (Neurology)  I have personally reviewed and noted the following in the patient's chart:   Medical and social history Use of alcohol, tobacco or illicit drugs  Current medications and supplements including opioid prescriptions. Functional ability and status Nutritional status Physical  activity Advanced directives List of other physicians Hospitalizations, surgeries, and ER visits in previous 12 months Vitals Screenings to include cognitive, depression, and falls Referrals and appointments  No orders of the defined types were placed in this encounter.  In addition, I have reviewed and discussed with patient certain preventive protocols, quality metrics, and best practice recommendations. A written personalized care plan for preventive services as well as general preventive health recommendations were provided to patient.   Edward Schneider, [place]   08/29/2024   No follow-ups on file.  After Visit Summary: (MyChart) Due to this being a telephonic visit, the after visit summary with patients personalized plan was offered to patient via MyChart   Nurse Notes: nothing to report

## 2024-08-29 NOTE — Patient Instructions (Signed)
 Mr. Eugene,  Thank you for taking the time for your Medicare Wellness Visit. I appreciate your continued commitment to your health goals. Please review the care plan we discussed, and feel free to reach out if I can assist you further.  Please note that Annual Wellness Visits do not include a physical exam. Some assessments may be limited, especially if the visit was conducted virtually. If needed, we may recommend an in-person follow-up with your provider.  Ongoing Care Seeing your primary care provider every 3 to 6 months helps us  monitor your health and provide consistent, personalized care.   Referrals If a referral was made during today's visit and you haven't received any updates within two weeks, please contact the referred provider directly to check on the status.  Recommended Screenings:  Health Maintenance  Topic Date Due   Yearly kidney health urinalysis for diabetes  07/09/2023   Medicare Annual Wellness Visit  07/09/2023   Complete foot exam   08/22/2024   Hemoglobin A1C  01/25/2025   Yearly kidney function blood test for diabetes  05/04/2025   Colon Cancer Screening  06/18/2025   Eye exam for diabetics  08/22/2025   DTaP/Tdap/Td vaccine (3 - Td or Tdap) 04/25/2028   Pneumococcal Vaccine for age over 6  Completed   Flu Shot  Completed   Zoster (Shingles) Vaccine  Completed   Hepatitis C Screening  Addressed   Meningitis B Vaccine  Aged Out   Screening for Lung Cancer  Discontinued   Hepatitis B Vaccine  Discontinued   COVID-19 Vaccine  Discontinued       08/29/2024    9:50 AM  Advanced Directives  Does Patient Have a Medical Advance Directive? No  Would patient like information on creating a medical advance directive? No - Patient declined    Vision: Annual vision screenings are recommended for early detection of glaucoma, cataracts, and diabetic retinopathy. These exams can also reveal signs of chronic conditions such as diabetes and high blood  pressure.  Dental: Annual dental screenings help detect early signs of oral cancer, gum disease, and other conditions linked to overall health, including heart disease and diabetes.  Please see the attached documents for additional preventive care recommendations.

## 2024-09-04 ENCOUNTER — Ambulatory Visit: Payer: Self-pay

## 2024-09-04 ENCOUNTER — Ambulatory Visit: Admitting: Family Medicine

## 2024-09-04 ENCOUNTER — Encounter: Payer: Self-pay | Admitting: Family Medicine

## 2024-09-04 VITALS — BP 129/70 | HR 66 | Temp 97.9°F | Resp 18 | Ht 68.0 in | Wt 253.0 lb

## 2024-09-04 DIAGNOSIS — K649 Unspecified hemorrhoids: Secondary | ICD-10-CM | POA: Diagnosis not present

## 2024-09-04 DIAGNOSIS — F1120 Opioid dependence, uncomplicated: Secondary | ICD-10-CM | POA: Diagnosis not present

## 2024-09-04 DIAGNOSIS — K648 Other hemorrhoids: Secondary | ICD-10-CM

## 2024-09-04 MED ORDER — OXYCODONE HCL 15 MG PO TABS: 15.0000 mg | ORAL_TABLET | Freq: Four times a day (QID) | ORAL | 0 refills | Status: DC | PRN

## 2024-09-04 MED ORDER — HYDROCORTISONE ACETATE 25 MG RE SUPP: 25.0000 mg | Freq: Two times a day (BID) | RECTAL | 11 refills | Status: AC | PRN

## 2024-09-04 NOTE — Telephone Encounter (Signed)
 FYI Only or Action Required?: FYI only for provider: appointment scheduled on 09/04/24.  Patient was last seen in primary care on 07/27/2024 by Ziglar, Devere POUR, MD.  Called Nurse Triage reporting Rectal Bleeding.  Symptoms began several days ago.  Interventions attempted: Nothing.  Symptoms are: rectal bleeding (blood on toilet tissue) gradually worsening. Abdominal pain and swelling at baseline and not worsened.  Triage Disposition: See Physician Within 24 Hours  Patient/caregiver understands and will follow disposition?: Yes          Copied from CRM #[id number]. Topic: Clinical - Red Word Triage >> Sep 04, 2024  8:47 AM Rosina BIRCH wrote: Reason for CRM: blood in stool since Thursday but got worse over the weekend Reason for Disposition  MODERATE rectal bleeding (e.g., small blood clots, passing blood without stool, or toilet water  turns red)  Answer Assessment - Initial Assessment Questions 1. APPEARANCE of BLOOD: What color is it? Is it passed separately, on the surface of the stool, or mixed in with the stool?      Dark red blood, sometimes in the toilet, mostly on toilet tissue. He states on Friday evening he had some blood clots that were a quarter or smaller and have resolved.  2. AMOUNT: How much blood was passed?      He states it soaks up the toilet tissue paper. Denies a continuous stream or drip of blood coming from his rectum.  3. FREQUENCY: How many times has blood been passed with the stools?      He states the bleeding is every time he uses the bathroom. He states he used the bathroom 6-7 times last night.  He denies diarrhea.  4. ONSET: When was the blood first seen in the stools? (Days or weeks)      Thursday 08/31/24.  5. DIARRHEA: Is there also some diarrhea? If Yes, ask: How many diarrhea stools in the past 24 hours?      No.  6. CONSTIPATION: Do you have constipation? If Yes, ask: How bad is it?     No.  7. RECURRENT SYMPTOMS: Have  you had blood in your stools before? If Yes, ask: When was the last time? and What happened that time?      He states every time my stomach is messed up  which is a chronic issue for years, he experiences bleeding.  8. BLOOD THINNERS: Do you take any blood thinners? (e.g., aspirin , clopidogrel  / Plavix , coumadin, heparin ). Notes: Other strong blood thinners include: Arixtra (fondaparinux), Eliquis (apixaban), Pradaxa (dabigatran), and Xarelto (rivaroxaban).     81mg  aspirin .  9. OTHER SYMPTOMS: Do you have any other symptoms?  (e.g., abdomen pain, vomiting, dizziness, fever)     Abdominal swelling and pain(states that has been chronic) not worse than his baseline, rectal burning states he is using baby wipes. Denies SOB, dizziness or lightheaded, fever, vomiting. Blood sugar 113 this morning. He states he gets low blood sugar in the 40-50s when he eats healthy.  Protocols used: Rectal Bleeding-A-AH

## 2024-09-07 DIAGNOSIS — K648 Other hemorrhoids: Secondary | ICD-10-CM | POA: Insufficient documentation

## 2024-09-07 NOTE — Progress Notes (Signed)
 Established Patient Office Visit  Subjective   Patient ID: Edward Brissett., male    DOB: Jun 24, 1956  Age: 68 y.o. MRN: [id number]  No chief complaint on file.   HPI Delightful 68 year old gentleman with DMT2, lumbar back pain (on opiates), mixed hyperlipidemia, episodic abdominal pain.  He started having bright red blood per rectum with bowel movements last 10-04-2024.  He has passed blood clots it is very painful for him to have a bowel movement even though his stool is soft.  Bowel movements burn.  He is already scheduled for colonoscopy 10/14/2024. He is going to a pain management clinic and they did not write his opiates but he has a follow-up with them in December.  He needs his narcotic pain medication filled today          Objective:     BP 129/70 (BP Location: Left Arm, Patient Position: Sitting, Cuff Size: Normal)   Pulse 66   Temp 97.9 F (36.6 C) (Oral)   Resp 18   Ht 5' 8 (1.727 m)   Wt 253 lb (114.8 kg)   SpO2 93%   BMI 38.47 kg/m    Physical Exam Vitals and nursing note reviewed. Exam conducted with a chaperone present.  Constitutional:      Appearance: Normal appearance.  HENT:     Head: Normocephalic and atraumatic.  Eyes:     Conjunctiva/sclera: Conjunctivae normal.  Cardiovascular:     Rate and Rhythm: Normal rate and regular rhythm.  Pulmonary:     Effort: Pulmonary effort is normal.     Breath sounds: Normal breath sounds.  Abdominal:     Hernia: There is no hernia in the left inguinal area or right inguinal area.  Genitourinary:    Pubic Area: No rash.      Epididymis:     Right: Normal.     Left: Normal.     Comments: Digital rectal exam heme positive.  Anoscope large hemorrhoid purple in color at 12:00. Musculoskeletal:     Right lower leg: No edema.     Left lower leg: No edema.  Skin:    General: Skin is warm and dry.  Neurological:     Mental Status: He is alert and oriented to person, place, and time.  Psychiatric:         Mood and Affect: Mood normal.        Behavior: Behavior normal.        Thought Content: Thought content normal.        Judgment: Judgment normal.          No results found for any visits on 09/04/24.    The ASCVD Risk score (Arnett DK, et al., 2019) failed to calculate for the following reasons:   Risk score cannot be calculated because patient has a medical history suggesting prior/existing ASCVD    Assessment & Plan:  Hemorrhoids, unspecified hemorrhoid type -     Hydrocortisone  Acetate; Place 1 suppository (25 mg total) rectally 2 (two) times daily as needed for hemorrhoids.  Dispense: 24 suppository; Refill: 11  Uncomplicated opioid dependence (HCC) -     oxyCODONE  HCl; Take 1 tablet (15 mg total) by mouth every 6 (six) hours as needed. :1 Tablet(s) By Mouth Every 4-6 Hours  Dispense: 150 tablet; Refill: 0  Internal hemorrhoids with complication Assessment & Plan: Internal hemorrhoid probably thrombosed as it is purple in color.  Anusol  suppositories twice a day.  He is scheduled for colonoscopy 10/14/2024.  He may have to have his hemorrhoids banded.      Return in about 4 weeks (around 10/02/2024).    Cristen Bredeson K Sabena Winner, MD

## 2024-09-07 NOTE — Assessment & Plan Note (Signed)
 Internal hemorrhoid probably thrombosed as it is purple in color.  Anusol  suppositories twice a day.  He is scheduled for colonoscopy 10/14/2024.  He may have to have his hemorrhoids banded.

## 2024-09-13 ENCOUNTER — Encounter: Admitting: Dietician

## 2024-09-18 ENCOUNTER — Telehealth: Payer: Self-pay | Admitting: Family Medicine

## 2024-09-18 ENCOUNTER — Other Ambulatory Visit: Payer: Self-pay | Admitting: Family Medicine

## 2024-09-18 DIAGNOSIS — E1165 Type 2 diabetes mellitus with hyperglycemia: Secondary | ICD-10-CM

## 2024-09-18 NOTE — Telephone Encounter (Signed)
 Call disconnected during transfer from [facility] from [facility] pharmacy to clarify medication order.  In review of chart, Dr. Onita spoke with pharmacist. Confirmed that oxycodone  sig is 4-6 hours prn #150 on 09/18/24 at 8:59 am. Please advise if any additional clarification needed.   This NT Attempted to contact pharmacy and unable to speak with pharmacy at this time.        Copied from CRM #[id number]. Topic: Clinical - Prescription Issue >> Sep 18, 2024  8:46 AM Vena HERO wrote: Reason for CRM: Catelyn calling from [facility] pharmacy needing clarification on Oxycodone  script sent on 11/17. 2 sets of directions and they need to know which set is correct [phone]

## 2024-09-18 NOTE — Telephone Encounter (Signed)
 Provider contacted the pharmacy to clarify this.

## 2024-09-18 NOTE — Telephone Encounter (Signed)
 Spoke with pharmacist.  Confirmed that oxycodone  sig is 4-6 hours prn.  #150

## 2024-09-18 NOTE — Telephone Encounter (Signed)
 Patient walked in and is stating that [facility] said the Rx was written wrong.  It should be Oxycodone  15 mg.  Saturday was his last pill.

## 2024-09-22 ENCOUNTER — Telehealth: Payer: Self-pay

## 2024-09-22 DIAGNOSIS — E1165 Type 2 diabetes mellitus with hyperglycemia: Secondary | ICD-10-CM

## 2024-09-22 MED ORDER — METFORMIN HCL ER 500 MG PO TB24: 500.0000 mg | ORAL_TABLET | Freq: Every day | ORAL | 0 refills | Status: DC

## 2024-09-22 NOTE — Telephone Encounter (Signed)
 Copied from CRM [id number]. Topic: Clinical - Prescription Issue >> Sep 22, 2024  4:24 PM Dedra B wrote: Reason for CRM: Pt was told by pharmacy that his metFORMIN  (GLUCOPHAGE -XR) 500 MG 24 hr tablet was denied and to resend prescription. Informed pt that it was sent to pharmacy at 3:42 PM. Pt will check back with pharmacy.

## 2024-09-22 NOTE — Telephone Encounter (Signed)
 I have sent 90-day supply of metFORMIN  (GLUCOPHAGE -XR) 500 MG 24 hr tablet to pt preferred pharmacy. Patient has been advised.

## 2024-09-22 NOTE — Telephone Encounter (Signed)
 Patient was advised during first encounter of medication refill and to provide the pharmacy 1-2 hours before pick-up.

## 2024-09-22 NOTE — Telephone Encounter (Signed)
 Copied from CRM [id number]. Topic: Clinical - Medication Question >> Sep 22, 2024 10:04 AM Dedra B wrote: Reason for CRM: Pt cal to follow up refill request sent by pharmacy on 09/18/24 for metFORMIN  (GLUCOPHAGE -XR) 500 MG 24 hr tablet. He is requesting a 90 day supply.

## 2024-09-24 NOTE — Progress Notes (Deleted)
 Edward Console, PA-C [address] [name], [place]  [address] Phone: [phone]   Gastroenterology Consultation  Referring Provider:     Odell Schneider, Edward Schneider * Primary Care Physician:  Edward Schneider, Edward Schneider Primary Gastroenterologist:  Edward Console, PA-C / *** Reason for Consultation:     Chronic constipation, pancreatitis        HPI:   Discussed the use of AI scribe software for clinical note transcription with the patient, who gave verbal consent to proceed.  Previous patient of Dr. Jinny at [facility].  06/22/24 CT abdomen pelvis with contrast at [facility]:  No acute abnormalities within the abdomen/pelvis.  Normal pancreatic contour.  No evidence of pancreas inflammation or lesions. Similar enlarged periportal and peripancreatic lymph nodes measuring up to 1.3 cm in the short axis, nonspecific.    05/2020 last colonoscopy by Dr. Jinny: Poor prep.  Stool in the entire colon.  Nonbleeding internal hemorrhoids.  No specimens collected.  5-year repeat colonoscopy was recommended.  He has family history of colon cancer in a first-degree relative before age 80.  History of Present Illness   09/2017 EGD: large amount of food in the stomach.  Normal esophagus and duodenum.  09/2023 CT abdomen pelvis with contrast: No acute abnormality.  Normal pancreas.    Past Medical History:  Diagnosis Date   Allergy    Aortic atherosclerosis    Aortic valve sclerosis    a. 08/2021 Echo: EF 60-65%, no rwma, mild LVH, nl RV fxn, mildly dil RA, AoV sclerosis w/o stenosis.   Asthma    C. difficile diarrhea    Chronic pain    Collagenous colitis    Coronary artery disease    a. 08/2019 Cath/PCI: LM 60p/m (FFR 0.87-->2.75 x 18 Resolute Onyx DES). D1/2/3 nl, RI nl, LCX 20p/m, RCA/RPDA nl, RPAV 50. EF 55-65%.   DDD (degenerative disc disease), cervical    DDD (degenerative disc disease), lumbar    GERD (gastroesophageal reflux disease)    Grade I diastolic dysfunction    Hepatic steatosis     Hyperlipidemia    Hypertension    Liver cancer (HCC) 03/2015   Migraines    Myocardial infarction (HCC)     OSA on CPAP    Seizures (HCC)    several as child when sick.  None since age 50   Stroke (HCC)    'mini-stroke 30 yrs ago. no deficits.   T2DM (type 2 diabetes mellitus) (HCC)    Wears dentures    full upper and lower    Past Surgical History:  Procedure Laterality Date   APPENDECTOMY     BACK SURGERY     CARDIAC CATHETERIZATION     No stent placed in his 45's   CERVICAL FUSION     COLONOSCOPY WITH PROPOFOL  N/A 03/06/2016   Procedure: COLONOSCOPY WITH PROPOFOL ;  Surgeon: Edward Schneider;  Location: [facility];  Service: Endoscopy;  Laterality: N/A;  requests early   COLONOSCOPY WITH PROPOFOL  N/A 06/18/2020   Procedure: COLONOSCOPY WITH PROPOFOL ;  Surgeon: Edward Schneider;  Location: [facility];  Service: Endoscopy;  Laterality: N/A;   CORONARY PRESSURE/FFR STUDY N/A 09/05/2019   Procedure: INTRAVASCULAR PRESSURE WIRE/FFR STUDY;  Surgeon: Edward Schneider;  Location: [facility];  Service: Cardiovascular;  Laterality: N/A;   ESOPHAGOGASTRODUODENOSCOPY (EGD) WITH PROPOFOL  N/A 09/20/2017   Procedure: ESOPHAGOGASTRODUODENOSCOPY (EGD) WITH PROPOFOL ;  Surgeon: Edward Schneider;  Location: [facility];  Service: Endoscopy;  Laterality: N/A;  Diabetic - oral meds   FINGER SURGERY Left    KNEE SURGERY Right    LEFT HEART CATH AND CORONARY ANGIOGRAPHY Left 09/05/2019   Procedure: LEFT HEART CATH AND CORONARY ANGIOGRAPHY (2.75 x 18 mm Resolute Onyx DES x 1 to prox/mid LAD);  Surgeon: Edward Schneider;  Location: [facility];  Service: Cardiovascular;  Laterality: Left;   NECK SURGERY     spleen surgery     TOE SURGERY Right    TOTAL HIP ARTHROPLASTY Right 06/03/2021   Procedure: TOTAL HIP ARTHROPLASTY ANTERIOR APPROACH;  Surgeon: Edward Schneider;  Location: [facility];  Service: Orthopedics;  Laterality: Right;   TOTAL KNEE  ARTHROPLASTY Right 08/22/2021   Procedure: TOTAL KNEE ARTHROPLASTY;  Surgeon: Edward Schneider;  Location: [facility];  Service: Orthopedics;  Laterality: Right;    Prior to Admission medications   Medication Sig Start Date End Date Taking? Authorizing Provider  Accu-Chek Softclix Lancets lancets Check blood sugar 2 times daily 04/06/23   Edward Schneider  acetaminophen  (TYLENOL  8 HOUR) 650 MG CR tablet 2 tablets as needed Orally Twice a day For Pain    Provider, Historical, Schneider  acetaminophen  (TYLENOL ) 650 MG CR tablet Take 1,300 mg by mouth every 8 (eight) hours.    Provider, Historical, Schneider  acetaminophen -caffeine  (EXCEDRIN  TENSION HEADACHE) 500-65 MG TABS per tablet Take 1 tablet by mouth as needed.    Provider, Historical, Schneider  albuterol  (VENTOLIN  HFA) 108 (90 Base) MCG/ACT inhaler Inhale 2 puffs into the lungs every 6 (six) hours as needed for wheezing or shortness of breath. 01/24/24   Edward Schneider  Alcohol Swabs (PHARMACIST CHOICE ALCOHOL) PADS SMARTSIG:1 Each Topical 4 Times Daily 09/18/21   Provider, Historical, Schneider  Ascorbic Acid  (VITAMIN C ) 1000 MG tablet Take 1,000 mg by mouth daily.     Provider, Historical, Schneider  aspirin  EC 81 MG tablet Take 81 mg by mouth daily. Swallow whole.    Provider, Historical, Schneider  atorvastatin  (LIPITOR ) 80 MG tablet TAKE 1 TABLET(80 MG) BY MOUTH AT BEDTIME 05/07/23   Edward Schneider  benazepril  (LOTENSIN ) 10 MG tablet Take 1 tablet (10 mg total) by mouth daily. 07/08/22   Edward Schneider  bisacodyl  (DULCOLAX) 5 MG EC tablet Take 2 tablets (10 mg total) by mouth daily in the afternoon. 03/06/22   Edward Schneider  budeson-glycopyrrolate -formoterol  (BREZTRI  AEROSPHERE) 160-9-4.8 MCG/ACT AERO Inhale 2 puffs into the lungs in the morning and at bedtime. 01/24/24   Edward Schneider  calcium  carbonate (OS-CAL) 600 MG TABS tablet Take 600 mg by mouth daily.     Provider, Historical, Schneider  cetirizine  (ZYRTEC ) 10 MG tablet Take 1 tablet (10  mg total) by mouth daily. 04/17/22   Edward Schneider  Cholecalciferol  (VITAMIN D3) 25 MCG (1000 UT) CHEW Chew 1,000 Units by mouth daily.     Provider, Historical, Schneider  Chromium-Cinnamon  (CINNAMON  PLUS CHROMIUM) [phone] MCG-MG CAPS 1 tablet Orally twice a day Daily supplement/metabolism support    Provider, Historical, Schneider  CINNAMON  PO Take 1,000 mg by mouth 2 (two) times daily.     Provider, Historical, Schneider  clobetasol  cream (TEMOVATE ) 0.05 % Apply 1 application topically 2 (two) times daily. 07/17/20   Daphane Rosella, Schneider  COMFORT EZ PEN NEEDLES 32G X 4 MM MISC Use to inject insulin  5 times daily 01/18/23   Edward Schneider  Continuous Glucose Sensor (FREESTYLE LIBRE 3 PLUS SENSOR) MISC Change sensor every  15 days. 03/30/24   Edward Schneider  cyclobenzaprine  (FLEXERIL ) 10 MG tablet Take 1 tablet (10 mg total) by mouth 3 (three) times daily as needed for muscle spasms. 01/07/18   Edward Schneider Carmine, Schneider  dapagliflozin  propanediol (FARXIGA ) 10 MG TABS tablet Take 1 tablet (10 mg total) by mouth daily before breakfast. 04/17/22   Edward Schneider  diclofenac  Sodium (VOLTAREN ) 1 % GEL Apply 2 g topically 4 (four) times daily as needed (pain).  07/10/20   Provider, Historical, Schneider  diphenhydrAMINE  (BENADRYL ) 25 mg capsule Take 50 mg by mouth in the morning, at noon, and at bedtime.    Provider, Historical, Schneider  doxepin  (SINEQUAN ) 25 MG capsule Take 2 capsules (50 mg total) by mouth at bedtime. 07/08/22   Edward Schneider  DULoxetine  (CYMBALTA ) 60 MG capsule Take 1 capsule (60 mg total) by mouth at bedtime. 07/14/24   Edward Schneider, Susan K, Schneider  EPINEPHrine  0.3 mg/0.3 mL IJ SOAJ injection Inject 0.3 mg into the muscle as needed for anaphylaxis. 12/12/21   Johnson, Megan P, DO  ezetimibe  (ZETIA ) 10 MG tablet Take 1 tablet (10 mg total) by mouth daily. 04/17/22   Edward Schneider  famotidine  (PEPCID ) 20 MG tablet Take 1 tablet (20 mg total) by mouth 2 (two) times daily. 06/13/24    Edward Schneider, Susan K, Schneider  Flaxseed, Linseed, (FLAXSEED OIL) 1200 MG CAPS Take 1 capsule by mouth daily.    Provider, Historical, Schneider  fluticasone (FLONASE) 50 MCG/ACT nasal spray Place into both nostrils. 11/16/23   Provider, Historical, Schneider  gabapentin  (NEURONTIN ) 300 MG capsule Take 300 mg by mouth 3 (three) times daily.    Provider, Historical, Schneider  Ginkgo Biloba 60 MG TABS Take 1 tablet by mouth daily.    Provider, Historical, Schneider  Ginseng 100 MG CAPS Take 100 mg by mouth daily.     Provider, Historical, Schneider  glucose blood (ACCU-CHEK GUIDE) test strip Check blood sugar 2 times daily 04/06/23   Edward Schneider  hydrocortisone  (ANUSOL -HC) 25 MG suppository Place 1 suppository (25 mg total) rectally 2 (two) times daily as needed for hemorrhoids. 09/04/24   Edward Schneider, Susan K, Schneider  insulin  lispro (HUMALOG  KWIKPEN) 100 UNIT/ML KwikPen 10 units Subcutaneous five times a day before every snack and meal. Five times a day for diabetes    Provider, Historical, Schneider  isosorbide  mononitrate (IMDUR ) 60 MG 24 hr tablet Take 1 tablet (60 mg total) by mouth daily. 11/03/22   Edward Schneider  LANTUS  100 UNIT/ML injection Inject 54 Units into the skin at bedtime. 06/04/23   Provider, Historical, Schneider  lidocaine  (XYLOCAINE ) 2 % solution Use as directed 15 mLs in the mouth or throat as needed for mouth pain. 12/12/21   Vicci Bouchard P, DO  metFORMIN  (GLUCOPHAGE -XR) 500 MG 24 hr tablet Take 1 tablet (500 mg total) by mouth daily with breakfast. 09/22/24   Edward Schneider, Susan K, Schneider  Minoxidil 5 % FOAM Apply 1 g topically daily. 11/23/22   Provider, Historical, Schneider  Misc Natural Products (BEET ROOT) 500 MG CAPS 1 tablet Orally twice a day Daily supplement/general wellness    Provider, Historical, Schneider  mometasone  (ELOCON ) 0.1 % ointment Apply topically 2 (two) times daily as needed. 12/23/21   Provider, Historical, Schneider  Multiple Vitamins-Minerals (CENTRUM SILVER 50+MEN) TABS Take 1 tablet by mouth daily.    Provider, Historical, Schneider   mupirocin  ointment (BACTROBAN ) 2 % Apply 1 Application topically 2 (two) times daily. 07/02/22  Edward Schneider  NARCAN  4 MG/0.1ML LIQD nasal spray kit Place 0.4 mg into the nose as needed (opioid overdose).  02/20/19   Provider, Historical, Schneider  nitroGLYCERIN  (NITROSTAT ) 0.4 MG SL tablet Place 1 tablet (0.4 mg total) under the tongue every 5 (five) minutes as needed for chest pain. 06/13/24   Edward Schneider, Susan K, Schneider  Omega-3 1000 MG CAPS Take 1,000 mg by mouth daily.     Provider, Historical, Schneider  ondansetron  (ZOFRAN ) 4 MG tablet TAKE 1 TABLET(4 MG) BY MOUTH EVERY 8 HOURS AS NEEDED FOR NAUSEA OR VOMITING 01/22/23   Edward Schneider  oxyCODONE  (ROXICODONE ) 15 MG immediate release tablet Take 1 tablet (15 mg total) by mouth every 4 (four) hours as needed. :1 Tablet(s) By Mouth Every 4-6 Hours 08/26/24   Edward Schneider, Susan K, Schneider  oxyCODONE  (ROXICODONE ) 15 MG immediate release tablet Take 1 tablet (15 mg total) by mouth every 4 (four) hours as needed. :1 Tablet(s) By Mouth Every 4-6 Hours 07/28/24   Edward Schneider, Susan K, Schneider  oxyCODONE  (ROXICODONE ) 15 MG immediate release tablet Take 1 tablet (15 mg total) by mouth every 6 (six) hours as needed. :1 Tablet(s) By Mouth Every 4-6 Hours 09/17/24   Edward Schneider, Susan K, Schneider  pantoprazole  (PROTONIX ) 40 MG tablet TAKE 1 TABLET(40 MG) BY MOUTH TWICE DAILY 06/01/22   Edward Schneider  polyethylene glycol (MIRALAX  / GLYCOLAX ) 17 g packet Take 17 g by mouth 2 (two) times daily. 03/18/22   Edward Schneider  senna-docusate (SENOKOT-S) 8.6-50 MG tablet Take 1 tablet by mouth 2 (two) times daily. 03/18/22   Edward Schneider  sucralfate  (CARAFATE ) 1 g tablet TAKE 1 TABLET BY MOUTH FOUR TIMES DAILY WITH MEALS AND AT BEDTIME 07/26/24   Edward Schneider, Susan K, Schneider  traZODone  (DESYREL ) 100 MG tablet Take 1 tablet (100 mg total) by mouth at bedtime as needed. for sleep 04/17/22   Edward Schneider  triamcinolone  (KENALOG ) 0.025 % cream Apply 1 Application topically 2 (two) times daily.  08/26/24   Provider, Historical, Schneider  vitamin A  10000 UNIT capsule Take 10,000 Units by mouth daily.    Provider, Historical, Schneider  vitamin B-12 (CYANOCOBALAMIN ) 1000 MCG tablet Take 1,000 mcg by mouth daily.    Provider, Historical, Schneider  Vitamin E  400 units TABS Take 400 Units by mouth daily.     Provider, Historical, Schneider    Family History  Problem Relation Age of Onset   Arthritis Mother    Diabetes Mother    Kidney disease Mother    Heart disease Mother    Hypertension Mother    Arthritis Father    Hearing loss Father    Hypertension Father    Heart disease Father    Diabetes Sister    Heart disease Sister    Diabetes Daughter    Diabetes Maternal Aunt    Diabetes Maternal Grandmother    Heart Problems Brother    Heart Problems Brother    Heart Problems Brother    Heart attack Maternal Grandfather    Colon cancer Paternal Grandfather      Social History   Tobacco Use   Smoking status: Former    Current packs/day: 0.00    Average packs/day: 2.0 packs/day for 50.0 years (100.0 ttl pk-yrs)    Types: Cigarettes    Start date: 26    Quit date: 2011    Years since quitting: 14.9    Passive exposure: Past   Smokeless  tobacco: Never  Vaping Use   Vaping status: Never Used  Substance Use Topics   Alcohol use: No    Alcohol/week: 0.0 standard drinks of alcohol   Drug use: Yes    Comment: prescribed narcotics    Allergies as of 09/25/2024 - Review Complete 09/04/2024  Allergen Reaction Noted   Bee pollen Anaphylaxis 08/29/2013   Bee venom Anaphylaxis 02/22/2014   Crestor  [rosuvastatin  calcium ] Shortness Of Breath and Swelling 03/03/2017   Fentanyl  Itching and Hives 03/27/2015   Gabapentin  Diarrhea 02/22/2014   Shellfish allergy Anaphylaxis and Swelling 03/20/2014   Chlorhexidine   11/23/2022   Fire ant  01/05/2022   Furosemide  Nausea And Vomiting 01/01/2020   Buprenorphine hcl Itching 10/10/2015   Chlorhexidine  gluconate Itching and Rash 08/13/2021   Simvastatin  Diarrhea 04/25/2015    Review of Systems:    All systems reviewed and negative except where noted in HPI.   Physical Exam:  There were no vitals taken for this visit. No LMP for male patient.  General:   Alert,  Well-developed, well-nourished, pleasant and cooperative in NAD Lungs:  Respirations even and unlabored.  Clear throughout to auscultation.   No wheezes, crackles, or rhonchi. No acute distress. Heart:  Regular rate and rhythm; no murmurs, clicks, rubs, or gallops. Abdomen:  Normal bowel sounds.  No bruits.  Soft, and non-distended without masses, hepatosplenomegaly or hernias noted.  No Tenderness.  No guarding or rebound tenderness.    Neurologic:  Alert and oriented x3;  grossly normal neurologically. Psych:  Alert and cooperative. Normal mood and affect.   Imaging Studies: No results found.  Labs: CBC    Component Value Date/Time   WBC 10.3 05/04/2024 0854   WBC 11.8 (H) 09/21/2023 0348   RBC 4.72 05/04/2024 0854   RBC 4.28 09/21/2023 0348   HGB 12.3 (L) 05/04/2024 0854   HCT 40.3 05/04/2024 0854   PLT 238 05/04/2024 0854   MCV 85 05/04/2024 0854   MCV 92 02/28/2014 1845    CMP     Component Value Date/Time   NA 138 05/04/2024 0854   NA 136 02/28/2014 1845   K 5.2 05/04/2024 0854   K 4.0 02/28/2014 1845   CL 100 05/04/2024 0854   CL 104 02/28/2014 1845   CO2 23 05/04/2024 0854   CO2 26 02/28/2014 1845   GLUCOSE 222 (H) 05/04/2024 0854   GLUCOSE 251 (H) 09/21/2023 0348   GLUCOSE 105 (H) 02/28/2014 1845   BUN 22 05/04/2024 0854   BUN 13 02/28/2014 1845   CREATININE 0.96 05/04/2024 0854   CREATININE 1.10 12/19/2021 1540   CALCIUM  10.1 05/04/2024 0854   CALCIUM  8.9 02/28/2014 1845   PROT 6.9 05/04/2024 0854   PROT 7.6 02/28/2014 1845   ALBUMIN 4.3 05/04/2024 0854   ALBUMIN 3.5 02/28/2014 1845   AST 28 05/04/2024 0854   AST 51 (H) 11/03/2017 1450   AST 20 02/28/2014 1845   ALT 32 05/04/2024 0854   ALT 58 (H) 11/03/2017 1450   ALT 25 02/28/2014  1845   ALKPHOS 63 05/04/2024 0854   ALKPHOS 62 02/28/2014 1845   BILITOT 0.5 05/04/2024 0854   BILITOT 0.7 02/28/2014 1845   GFRNONAA >60 09/21/2023 0348   GFRNONAA >60 02/28/2014 1845   GFRAA 75 12/06/2020 1554   GFRAA >60 02/28/2014 1845    Assessment and Plan:   Edward Schneider. is a 68 y.o. y/o male has been referred for ***  Assessment and Plan Assessment & Plan  Follow up ***  Edward Console, PA-C

## 2024-09-25 ENCOUNTER — Other Ambulatory Visit: Payer: Self-pay

## 2024-09-25 ENCOUNTER — Encounter: Payer: Self-pay | Admitting: Ophthalmology

## 2024-09-25 ENCOUNTER — Ambulatory Visit: Admitting: Physician Assistant

## 2024-09-25 DIAGNOSIS — E1165 Type 2 diabetes mellitus with hyperglycemia: Secondary | ICD-10-CM

## 2024-09-25 MED ORDER — METFORMIN HCL ER 500 MG PO TB24: 500.0000 mg | ORAL_TABLET | Freq: Every day | ORAL | 0 refills | Status: DC

## 2024-09-27 ENCOUNTER — Ambulatory Visit
Admission: RE | Admit: 2024-09-27 | Discharge: 2024-09-27 | Disposition: A | Discharge status: Home / Self Care | Attending: Ophthalmology | Admitting: Ophthalmology

## 2024-09-27 ENCOUNTER — Ambulatory Visit: Payer: Self-pay | Admitting: Anesthesiology

## 2024-09-27 ENCOUNTER — Other Ambulatory Visit: Payer: Self-pay

## 2024-09-27 ENCOUNTER — Encounter: Admission: RE | Disposition: A | Payer: Self-pay | Source: Home / Self Care | Attending: Ophthalmology

## 2024-09-27 ENCOUNTER — Encounter: Payer: Self-pay | Admitting: Ophthalmology

## 2024-09-27 DIAGNOSIS — E1165 Type 2 diabetes mellitus with hyperglycemia: Secondary | ICD-10-CM

## 2024-09-27 HISTORY — PX: CATARACT EXTRACTION W/PHACO: SHX586

## 2024-09-27 LAB — GLUCOSE, CAPILLARY: Glucose-Capillary: 187 mg/dL — ABNORMAL HIGH (ref 70–99)

## 2024-09-27 SURGERY — PHACOEMULSIFICATION, CATARACT, WITH IOL INSERTION
Anesthesia: Monitor Anesthesia Care | Site: Eye | Laterality: Left

## 2024-09-27 MED ORDER — LACTATED RINGERS IV SOLN: INTRAVENOUS | Status: DC

## 2024-09-27 MED ORDER — SIGHTPATH DOSE#1 BSS IO SOLN
INTRAOCULAR | Status: DC | PRN
  Administered 2024-09-27: 15 mL via INTRAOCULAR

## 2024-09-27 MED ORDER — DEXMEDETOMIDINE HCL IN NACL 400 MCG/100ML IV SOLN
INTRAVENOUS | Status: DC | PRN
  Administered 2024-09-27 (×2): 8 ug via INTRAVENOUS
  Administered 2024-09-27: 4 ug via INTRAVENOUS

## 2024-09-27 MED ORDER — SIGHTPATH DOSE#1 NA HYALUR & NA CHOND-NA HYALUR IO KIT
PACK | INTRAOCULAR | Status: DC | PRN
  Administered 2024-09-27: 1 via OPHTHALMIC

## 2024-09-27 MED ORDER — MIDAZOLAM HCL 2 MG/2ML IJ SOLN
INTRAMUSCULAR | Status: AC
  Filled 2024-09-27: qty 2

## 2024-09-27 MED ORDER — DEXMEDETOMIDINE HCL IN NACL 80 MCG/20ML IV SOLN
INTRAVENOUS | Status: AC
  Filled 2024-09-27: qty 20

## 2024-09-27 MED ORDER — MIDAZOLAM HCL (PF) 2 MG/2ML IJ SOLN
INTRAMUSCULAR | Status: DC | PRN
  Administered 2024-09-27 (×4): 1 mg via INTRAVENOUS

## 2024-09-27 MED ORDER — CYCLOPENTOLATE HCL 2 % OP SOLN
OPHTHALMIC | Status: AC
  Filled 2024-09-27: qty 2

## 2024-09-27 MED ORDER — PHENYLEPHRINE HCL 10 % OP SOLN
1.0000 [drp] | OPHTHALMIC | Status: AC
  Administered 2024-09-27 (×3): 1 [drp] via OPHTHALMIC

## 2024-09-27 MED ORDER — CEFUROXIME OPHTHALMIC INJECTION 1 MG/0.1 ML
INJECTION | OPHTHALMIC | Status: DC | PRN
  Administered 2024-09-27: 1 mg via OPHTHALMIC

## 2024-09-27 MED ORDER — SIGHTPATH DOSE#1 BSS IO SOLN
INTRAOCULAR | Status: DC | PRN
  Administered 2024-09-27: 53 mL via OPHTHALMIC

## 2024-09-27 MED ORDER — CYCLOPENTOLATE HCL 2 % OP SOLN
1.0000 [drp] | OPHTHALMIC | Status: AC
  Administered 2024-09-27 (×3): 1 [drp] via OPHTHALMIC

## 2024-09-27 MED ORDER — PHENYLEPHRINE HCL 10 % OP SOLN
OPHTHALMIC | Status: AC
  Filled 2024-09-27: qty 5

## 2024-09-27 MED ORDER — LIDOCAINE HCL (PF) 2 % IJ SOLN
INTRAOCULAR | Status: DC | PRN
  Administered 2024-09-27: 2 mL

## 2024-09-27 MED ORDER — TETRACAINE HCL 0.5 % OP SOLN
1.0000 [drp] | OPHTHALMIC | Status: DC | PRN
  Administered 2024-09-27 (×3): 1 [drp] via OPHTHALMIC

## 2024-09-27 MED ORDER — BRIMONIDINE TARTRATE-TIMOLOL 0.2-0.5 % OP SOLN
OPHTHALMIC | Status: DC | PRN
  Administered 2024-09-27: 1 [drp] via OPHTHALMIC

## 2024-09-27 MED ORDER — ONDANSETRON HCL 4 MG/2ML IJ SOLN: 4.0000 mg | Freq: Once | INTRAMUSCULAR | Status: DC | PRN

## 2024-09-27 MED ORDER — TETRACAINE HCL 0.5 % OP SOLN
OPHTHALMIC | Status: AC
  Filled 2024-09-27: qty 4

## 2024-09-27 SURGICAL SUPPLY — 8 items
FEE CATARACT SUITE SIGHTPATH (MISCELLANEOUS) ×1 IMPLANT
GLOVE BIOGEL PI IND STRL 8 (GLOVE) ×1 IMPLANT
GLOVE SURG LX STRL 7.5 STRW (GLOVE) ×1 IMPLANT
GLOVE SURG SYN 6.5 PF PI BL (GLOVE) ×1 IMPLANT
LENS IOL CLRN TRC 3 21.5 IMPLANT
NDL FILTER BLUNT 18X1 1/2 (NEEDLE) ×1 IMPLANT
NEEDLE FILTER BLUNT 18X1 1/2 (NEEDLE) ×1 IMPLANT
SYR 3ML LL SCALE MARK (SYRINGE) ×1 IMPLANT

## 2024-09-27 NOTE — Anesthesia Postprocedure Evaluation (Signed)
 Anesthesia Post Note  Patient: Falon Flinchum.  Procedure(s) Performed: PHACOEMULSIFICATION, CATARACT, WITH IOL INSERTION 4.76 00:36.4 (Left: Eye)  Patient location during evaluation: PACU Anesthesia Type: MAC Level of consciousness: awake and alert, oriented and patient cooperative Pain management: pain level controlled Vital Signs Assessment: post-procedure vital signs reviewed and stable Respiratory status: spontaneous breathing, nonlabored ventilation and respiratory function stable Cardiovascular status: blood pressure returned to baseline and stable Postop Assessment: adequate PO intake Anesthetic complications: no   No notable events documented.   Last Vitals:  Vitals:   09/27/24 1029 09/27/24 1039  BP: 130/70 124/77  Pulse: 70 73  Resp: 19 16  Temp: (!) 36.3 C (!) 36.3 C  SpO2: 94% 93%    Last Pain:  Vitals:   09/27/24 1039  TempSrc:   PainSc: 0-No pain                 Alfonso Ruths

## 2024-09-27 NOTE — Discharge Instructions (Signed)

## 2024-09-27 NOTE — Anesthesia Preprocedure Evaluation (Addendum)
 Anesthesia Evaluation  Patient identified by MRN, date of birth, ID band Patient awake    Reviewed: Allergy & Precautions, NPO status , Patient's Chart, lab work & pertinent test results  History of Anesthesia Complications Negative for: history of anesthetic complications  Airway Mallampati: III   Neck ROM: Full    Dental  (+) Edentulous Upper, Edentulous Lower   Pulmonary asthma , former smoker (quit 2011)   Pulmonary exam normal breath sounds clear to auscultation       Cardiovascular hypertension, + CAD (s/p MI and stents)  Normal cardiovascular exam Rhythm:Regular Rate:Normal  ECG 03/09/24: Sinus rhythm with 1st degree A-V block   Neuro/Psych Seizures - (childhood),  Chronic pain  Neuromuscular disease (peripheral neuropathy) CVA (age 25)    GI/Hepatic ,GERD  ,,Liver CA   Endo/Other  diabetes, Type 2  Obesity   Renal/GU negative Renal ROS     Musculoskeletal   Abdominal   Peds  Hematology negative hematology ROS (+)   Anesthesia Other Findings   Reproductive/Obstetrics                              Anesthesia Physical Anesthesia Plan  ASA: 3  Anesthesia Plan: MAC   Post-op Pain Management:    Induction: Intravenous  PONV Risk Score and Plan: 1 and Treatment may vary due to age or medical condition, Midazolam  and TIVA  Airway Management Planned: Natural Airway and Nasal Cannula  Additional Equipment:   Intra-op Plan:   Post-operative Plan:   Informed Consent: I have reviewed the patients History and Physical, chart, labs and discussed the procedure including the risks, benefits and alternatives for the proposed anesthesia with the patient or authorized representative who has indicated his/her understanding and acceptance.     Dental advisory given  Plan Discussed with: CRNA  Anesthesia Plan Comments: (LMA/GETA backup discussed.  Patient consented for risks of  anesthesia including but not limited to:  - adverse reactions to medications - damage to eyes, teeth, lips or other oral mucosa - nerve damage due to positioning  - sore throat or hoarseness - damage to heart, brain, nerves, lungs, other parts of body or loss of life  Informed patient about role of CRNA in peri- and intra-operative care.  Patient voiced understanding.)         Anesthesia Quick Evaluation

## 2024-09-27 NOTE — Transfer of Care (Signed)
 Immediate Anesthesia Transfer of Care Note  Patient: Edward Schneider.  Procedure(s) Performed: PHACOEMULSIFICATION, CATARACT, WITH IOL INSERTION 4.76 00:36.4 (Left: Eye)  Patient Location: PACU  Anesthesia Type: MAC  Level of Consciousness: awake, alert  and patient cooperative  Airway and Oxygen  Therapy: Patient Spontanous Breathing and Patient connected to supplemental oxygen   Post-op Assessment: Post-op Vital signs reviewed, Patient's Cardiovascular Status Stable, Respiratory Function Stable, Patent Airway and No signs of Nausea or vomiting  Post-op Vital Signs: Reviewed and stable  Complications: No notable events documented.

## 2024-09-27 NOTE — H&P (Signed)
 [facility]   Primary Care Physician:  Onita Devere POUR, MD Ophthalmologist: Dr. Dene Etienne  Pre-Procedure History & Physical: HPI:  Travell Desaulniers. is a 68 y.o. male here for ophthalmic surgery.   Past Medical History:  Diagnosis Date   Allergy    Aortic atherosclerosis    Aortic valve sclerosis    a. 08/2021 Echo: EF 60-65%, no rwma, mild LVH, nl RV fxn, mildly dil RA, AoV sclerosis w/o stenosis.   Asthma    C. difficile diarrhea    Chronic pain    Collagenous colitis    Coronary artery disease    a. 08/2019 Cath/PCI: LM 60p/m (FFR 0.87-->2.75 x 18 Resolute Onyx DES). D1/2/3 nl, RI nl, LCX 20p/m, RCA/RPDA nl, RPAV 50. EF 55-65%.   DDD (degenerative disc disease), cervical    DDD (degenerative disc disease), lumbar    GERD (gastroesophageal reflux disease)    Grade I diastolic dysfunction    Hepatic steatosis    Hyperlipidemia    Hypertension    Liver cancer (HCC) 03/2015   Migraines    Myocardial infarction (HCC)     OSA on CPAP    Seizures (HCC)    several as child when sick.  None since age 65   Stroke (HCC)    'mini-stroke 30 yrs ago. no deficits.   T2DM (type 2 diabetes mellitus) (HCC)    Wears dentures    full upper and lower    Past Surgical History:  Procedure Laterality Date   APPENDECTOMY     BACK SURGERY     CARDIAC CATHETERIZATION     No stent placed in his 63's   CERVICAL FUSION     COLONOSCOPY WITH PROPOFOL  N/A 03/06/2016   Procedure: COLONOSCOPY WITH PROPOFOL ;  Surgeon: Rogelia Copping, MD;  Location: [facility];  Service: Endoscopy;  Laterality: N/A;  requests early   COLONOSCOPY WITH PROPOFOL  N/A 06/18/2020   Procedure: COLONOSCOPY WITH PROPOFOL ;  Surgeon: Copping Rogelia, MD;  Location: [facility];  Service: Endoscopy;  Laterality: N/A;   CORONARY PRESSURE/FFR STUDY N/A 09/05/2019   Procedure: INTRAVASCULAR PRESSURE WIRE/FFR STUDY;  Surgeon: Mady Bruckner, MD;  Location: [facility];  Service:  Cardiovascular;  Laterality: N/A;   ESOPHAGOGASTRODUODENOSCOPY (EGD) WITH PROPOFOL  N/A 09/20/2017   Procedure: ESOPHAGOGASTRODUODENOSCOPY (EGD) WITH PROPOFOL ;  Surgeon: Copping Rogelia, MD;  Location: [facility];  Service: Endoscopy;  Laterality: N/A;  Diabetic - oral meds   FINGER SURGERY Left    KNEE SURGERY Right    LEFT HEART CATH AND CORONARY ANGIOGRAPHY Left 09/05/2019   Procedure: LEFT HEART CATH AND CORONARY ANGIOGRAPHY (2.75 x 18 mm Resolute Onyx DES x 1 to prox/mid LAD);  Surgeon: Mady Bruckner, MD;  Location: [facility];  Service: Cardiovascular;  Laterality: Left;   NECK SURGERY     spleen surgery     TOE SURGERY Right    TOTAL HIP ARTHROPLASTY Right 06/03/2021   Procedure: TOTAL HIP ARTHROPLASTY ANTERIOR APPROACH;  Surgeon: Kathlynn Sharper, MD;  Location: [facility];  Service: Orthopedics;  Laterality: Right;   TOTAL KNEE ARTHROPLASTY Right 08/22/2021   Procedure: TOTAL KNEE ARTHROPLASTY;  Surgeon: Kathlynn Sharper, MD;  Location: [facility];  Service: Orthopedics;  Laterality: Right;    Prior to Admission medications   Medication Sig Start Date End Date Taking? Authorizing Provider  Accu-Chek Softclix Lancets lancets Check blood sugar 2 times daily 04/06/23  Yes Shamleffer, Ibtehal Jaralla, MD  acetaminophen  (TYLENOL  8 HOUR) 650 MG CR tablet 2  tablets as needed Orally Twice a day For Pain   Yes [provider]  acetaminophen  (TYLENOL ) 650 MG CR tablet Take 1,300 mg by mouth every 8 (eight) hours.   Yes [provider]  albuterol  (VENTOLIN  HFA) 108 (90 Base) MCG/ACT inhaler Inhale 2 puffs into the lungs every 6 (six) hours as needed for wheezing or shortness of breath. 01/24/24  Yes Olalere, Adewale A, MD  Ascorbic Acid  (VITAMIN C ) 1000 MG tablet Take 1,000 mg by mouth daily.    Yes [provider]  aspirin  EC 81 MG tablet Take 81 mg by mouth daily. Swallow whole.   Yes [provider]  atorvastatin  (LIPITOR ) 80 MG tablet TAKE 1 TABLET(80  MG) BY MOUTH AT BEDTIME 05/07/23  Yes Cannady, Jolene T, NP  benazepril  (LOTENSIN ) 10 MG tablet Take 1 tablet (10 mg total) by mouth daily. 07/08/22  Yes Cannady, Jolene T, NP  budeson-glycopyrrolate -formoterol  (BREZTRI  AEROSPHERE) 160-9-4.8 MCG/ACT AERO Inhale 2 puffs into the lungs in the morning and at bedtime. 01/24/24  Yes Olalere, Adewale A, MD  calcium  carbonate (OS-CAL) 600 MG TABS tablet Take 600 mg by mouth daily.    Yes [provider]  cetirizine  (ZYRTEC ) 10 MG tablet Take 1 tablet (10 mg total) by mouth daily. 04/17/22  Yes Cannady, Jolene T, NP  Cholecalciferol  (VITAMIN D3) 25 MCG (1000 UT) CHEW Chew 1,000 Units by mouth daily.    Yes [provider]  Chromium-Cinnamon  (CINNAMON  PLUS CHROMIUM) [phone] MCG-MG CAPS 1 tablet Orally twice a day Daily supplement/metabolism support   Yes [provider]  CINNAMON  PO Take 1,000 mg by mouth 2 (two) times daily.    Yes [provider]  cyclobenzaprine  (FLEXERIL ) 10 MG tablet Take 1 tablet (10 mg total) by mouth 3 (three) times daily as needed for muscle spasms. 01/07/18  Yes Jinny Carmine, MD  dapagliflozin  propanediol (FARXIGA ) 10 MG TABS tablet Take 1 tablet (10 mg total) by mouth daily before breakfast. 04/17/22  Yes Cannady, Jolene T, NP  diclofenac  Sodium (VOLTAREN ) 1 % GEL Apply 2 g topically 4 (four) times daily as needed (pain).  07/10/20  Yes [provider]  doxepin  (SINEQUAN ) 25 MG capsule Take 2 capsules (50 mg total) by mouth at bedtime. 07/08/22  Yes Cannady, Jolene T, NP  DULoxetine  (CYMBALTA ) 60 MG capsule Take 1 capsule (60 mg total) by mouth at bedtime. 07/14/24  Yes Ziglar, Susan K, MD  EPINEPHrine  0.3 mg/0.3 mL IJ SOAJ injection Inject 0.3 mg into the muscle as needed for anaphylaxis. 12/12/21  Yes Johnson, Megan P, DO  ezetimibe  (ZETIA ) 10 MG tablet Take 1 tablet (10 mg total) by mouth daily. 04/17/22  Yes Cannady, Jolene T, NP  famotidine  (PEPCID ) 20 MG tablet Take 1 tablet (20 mg total) by  mouth 2 (two) times daily. 06/13/24  Yes Ziglar, Susan K, MD  Flaxseed, Linseed, (FLAXSEED OIL) 1200 MG CAPS Take 1 capsule by mouth daily.   Yes [provider]  fluticasone (FLONASE) 50 MCG/ACT nasal spray Place into both nostrils. 11/16/23  Yes [provider]  gabapentin  (NEURONTIN ) 300 MG capsule Take 300 mg by mouth 3 (three) times daily.   Yes [provider]  Ginkgo Biloba 60 MG TABS Take 1 tablet by mouth daily.   Yes [provider]  Ginseng 100 MG CAPS Take 100 mg by mouth daily.    Yes [provider]  hydrocortisone  (ANUSOL -HC) 25 MG suppository Place 1 suppository (25 mg total) rectally 2 (two) times daily as  needed for hemorrhoids. 09/04/24  Yes Ziglar, Susan K, MD  insulin  lispro (HUMALOG  KWIKPEN) 100 UNIT/ML KwikPen 10 units Subcutaneous five times a day before every snack and meal. Five times a day for diabetes   Yes [provider]  LANTUS  100 UNIT/ML injection Inject 54 Units into the skin at bedtime. 06/04/23  Yes [provider]  metFORMIN  (GLUCOPHAGE -XR) 500 MG 24 hr tablet Take 1 tablet (500 mg total) by mouth daily with breakfast. 09/25/24  Yes Ziglar, Susan K, MD  Misc Natural Products (BEET ROOT) 500 MG CAPS 1 tablet Orally twice a day Daily supplement/general wellness   Yes [provider]  Multiple Vitamins-Minerals (CENTRUM SILVER 50+MEN) TABS Take 1 tablet by mouth daily.   Yes [provider]  mupirocin  ointment (BACTROBAN ) 2 % Apply 1 Application topically 2 (two) times daily. 07/02/22  Yes Cannady, Jolene T, NP  nitroGLYCERIN  (NITROSTAT ) 0.4 MG SL tablet Place 1 tablet (0.4 mg total) under the tongue every 5 (five) minutes as needed for chest pain. 06/13/24  Yes Ziglar, Susan K, MD  Omega-3 1000 MG CAPS Take 1,000 mg by mouth daily.    Yes [provider]  ondansetron  (ZOFRAN ) 4 MG tablet TAKE 1 TABLET(4 MG) BY MOUTH EVERY 8 HOURS AS NEEDED FOR NAUSEA OR VOMITING 01/22/23  Yes Cannady,  Jolene T, NP  oxyCODONE  (ROXICODONE ) 15 MG immediate release tablet Take 1 tablet (15 mg total) by mouth every 4 (four) hours as needed. :1 Tablet(s) By Mouth Every 4-6 Hours 08/26/24  Yes Ziglar, Susan K, MD  pantoprazole  (PROTONIX ) 40 MG tablet TAKE 1 TABLET(40 MG) BY MOUTH TWICE DAILY 06/01/22  Yes Cannady, Jolene T, NP  polyethylene glycol (MIRALAX  / GLYCOLAX ) 17 g packet Take 17 g by mouth 2 (two) times daily. 03/18/22  Yes Cannady, Jolene T, NP  senna-docusate (SENOKOT-S) 8.6-50 MG tablet Take 1 tablet by mouth 2 (two) times daily. 03/18/22  Yes Cannady, Jolene T, NP  sucralfate  (CARAFATE ) 1 g tablet TAKE 1 TABLET BY MOUTH FOUR TIMES DAILY WITH MEALS AND AT BEDTIME 07/26/24  Yes Ziglar, Susan K, MD  traZODone  (DESYREL ) 100 MG tablet Take 1 tablet (100 mg total) by mouth at bedtime as needed. for sleep 04/17/22  Yes Cannady, Jolene T, NP  triamcinolone  (KENALOG ) 0.025 % cream Apply 1 Application topically 2 (two) times daily. 08/26/24  Yes [provider]  vitamin A  10000 UNIT capsule Take 10,000 Units by mouth daily.   Yes [provider]  vitamin B-12 (CYANOCOBALAMIN ) 1000 MCG tablet Take 1,000 mcg by mouth daily.   Yes [provider]  acetaminophen -caffeine  (EXCEDRIN  TENSION HEADACHE) 500-65 MG TABS per tablet Take 1 tablet by mouth as needed. Patient not taking: Reported on 09/25/2024    [provider]  Alcohol Swabs (PHARMACIST CHOICE ALCOHOL) PADS SMARTSIG:1 Each Topical 4 Times Daily 09/18/21   [provider]  bisacodyl  (DULCOLAX) 5 MG EC tablet Take 2 tablets (10 mg total) by mouth daily in the afternoon. 03/06/22   Patel, Sona, MD  clobetasol  cream (TEMOVATE ) 0.05 % Apply 1 application topically 2 (two) times daily. 07/17/20   Daphane Rosella, NP  COMFORT EZ PEN NEEDLES 32G X 4 MM MISC Use to inject insulin  5 times daily 01/18/23   Shamleffer, Ibtehal Jaralla, MD  Continuous Glucose Sensor (FREESTYLE LIBRE 3 PLUS SENSOR) MISC Change sensor every 15  days. 03/30/24   Shamleffer, Ibtehal Jaralla, MD  diphenhydrAMINE  (BENADRYL ) 25 mg capsule Take 50 mg by mouth in the morning, at noon,  and at bedtime. Patient not taking: Reported on 09/25/2024    [provider]  glucose blood (ACCU-CHEK GUIDE) test strip Check blood sugar 2 times daily 04/06/23   Shamleffer, Ibtehal Jaralla, MD  isosorbide  mononitrate (IMDUR ) 60 MG 24 hr tablet Take 1 tablet (60 mg total) by mouth daily. Patient not taking: Reported on 09/25/2024 11/03/22   Cannady, Jolene T, NP  lidocaine  (XYLOCAINE ) 2 % solution Use as directed 15 mLs in the mouth or throat as needed for mouth pain. Patient not taking: Reported on 09/25/2024 12/12/21   Johnson, Megan P, DO  Minoxidil 5 % FOAM Apply 1 g topically daily. Patient not taking: Reported on 09/25/2024 11/23/22   [provider]  mometasone  (ELOCON ) 0.1 % ointment Apply topically 2 (two) times daily as needed. 12/23/21   [provider]  NARCAN  4 MG/0.1ML LIQD nasal spray kit Place 0.4 mg into the nose as needed (opioid overdose). Never uses 02/20/19   [provider]  oxyCODONE  (ROXICODONE ) 15 MG immediate release tablet Take 1 tablet (15 mg total) by mouth every 4 (four) hours as needed. :1 Tablet(s) By Mouth Every 4-6 Hours 07/28/24   Ziglar, Susan K, MD  oxyCODONE  (ROXICODONE ) 15 MG immediate release tablet Take 1 tablet (15 mg total) by mouth every 6 (six) hours as needed. :1 Tablet(s) By Mouth Every 4-6 Hours 09/17/24   Ziglar, Susan K, MD  Vitamin E  400 units TABS Take 400 Units by mouth daily.  Patient not taking: Reported on 09/25/2024    [provider]    Allergies as of 08/29/2024 - Review Complete 08/29/2024  Allergen Reaction Noted   Bee pollen Anaphylaxis 08/29/2013   Bee venom Anaphylaxis 02/22/2014   Crestor  [rosuvastatin  calcium ] Shortness Of Breath and Swelling 03/03/2017   Fentanyl  Itching and Hives 03/27/2015   Gabapentin  Diarrhea 02/22/2014   Shellfish allergy Anaphylaxis and  Swelling 03/20/2014   Chlorhexidine   11/23/2022   Fire ant  01/05/2022   Furosemide  Nausea And Vomiting 01/01/2020   Buprenorphine hcl Itching 10/10/2015   Chlorhexidine  gluconate Itching and Rash 08/13/2021   Simvastatin Diarrhea 04/25/2015    Family History  Problem Relation Age of Onset   Arthritis Mother    Diabetes Mother    Kidney disease Mother    Heart disease Mother    Hypertension Mother    Arthritis Father    Hearing loss Father    Hypertension Father    Heart disease Father    Diabetes Sister    Heart disease Sister    Diabetes Daughter    Diabetes Maternal Aunt    Diabetes Maternal Grandmother    Heart Problems Brother    Heart Problems Brother    Heart Problems Brother    Heart attack Maternal Grandfather    Colon cancer Paternal Grandfather     Social History   Socioeconomic History   Marital status: Married    Spouse name: Not on file   Number of children: Not on file   Years of education: 13.5   Highest education level: Some college, no degree  Occupational History   Occupation: disability   Tobacco Use   Smoking status: Former    Current packs/day: 0.00    Average packs/day: 2.0 packs/day for 50.0 years (100.0 ttl pk-yrs)    Types: Cigarettes    Start date: 32    Quit date: 2011    Years since quitting: 14.9    Passive exposure: Past   Smokeless tobacco: Never  Vaping Use  Vaping status: Never Used  Substance and Sexual Activity   Alcohol use: No    Alcohol/week: 0.0 standard drinks of alcohol   Drug use: Yes    Comment: prescribed narcotics   Sexual activity: Not Currently  Other Topics Concern   Not on file  Social History Narrative   Right handed    Lives with wife   Social Drivers of Health   Financial Resource Strain: Low Risk  (07/08/2022)   Overall Financial Resource Strain (CARDIA)    Difficulty of Paying Living Expenses: Not hard at all  Food Insecurity: No Food Insecurity (08/29/2024)   Hunger Vital Sign    Worried  About Running Out of Food in the Last Year: Never true    Ran Out of Food in the Last Year: Never true  Transportation Needs: No Transportation Needs (08/29/2024)   PRAPARE - Administrator, Civil Service (Medical): No    Lack of Transportation (Non-Medical): No  Physical Activity: Inactive (08/29/2024)   Exercise Vital Sign    Days of Exercise per Week: 0 days    Minutes of Exercise per Session: Patient declined  Stress: No Stress Concern Present (08/29/2024)   [employer] of Occupational Health - Occupational Stress Questionnaire    Feeling of Stress: Not at all  Social Connections: Moderately Isolated (08/29/2024)   Social Connection and Isolation Panel    Frequency of Communication with Friends and Family: Once a week    Frequency of Social Gatherings with Friends and Family: Never    Attends Religious Services: More than 4 times per year    Active Member of [employer] or Organizations: No    Attends Banker Meetings: Never    Marital Status: Married  Catering Manager Violence: Not At Risk (08/29/2024)   Humiliation, Afraid, Rape, and Kick questionnaire    Fear of Current or Ex-Partner: No    Emotionally Abused: No    Physically Abused: No    Sexually Abused: No    Review of Systems: See HPI, otherwise negative ROS  Physical Exam: BP (!) 161/94   Pulse 77   Temp 98 F (36.7 C) (Temporal)   Resp 18   Ht 5' 8 (1.727 m)   Wt 112 kg   SpO2 95%   BMI 37.56 kg/m  General:   Alert,  pleasant and cooperative in NAD Head:  Normocephalic and atraumatic. Lungs:  Clear to auscultation.    Heart:  Regular rate and rhythm.   Impression/Plan: Edward Schneider. is here for ophthalmic surgery.  Risks, benefits, limitations, and alternatives regarding ophthalmic surgery have been reviewed with the patient.  Questions have been answered.  All parties agreeable.   MITTIE GASKIN, MD  09/27/2024, 9:01 AM

## 2024-09-27 NOTE — Op Note (Signed)
 LOCATION:  [facility]   PREOPERATIVE DIAGNOSIS:  Nuclear sclerotic cataract of the left eye.  H25.12  POSTOPERATIVE DIAGNOSIS:  Nuclear sclerotic cataract of the left eye.   PROCEDURE:  Phacoemulsification with Toric posterior chamber intraocular lens placement of the left eye.  Ultrasound time: Procedure(s): PHACOEMULSIFICATION, CATARACT, WITH IOL INSERTION 4.76 00:36.4 (Left)  LENS:   Implant Name Type Inv. Item Serial No. Manufacturer Lot No. LRB No. Used Action  LENS IOL CLRN TRC 3 21.5 - [id number]  LENS IOL CLRN TRC 3 21.5 [id number] [employer]  Left 1 Implanted     [id number] Toric intraocular lens with 1.5 diopters of cylindrical power with axis orientation at 40 degrees.     SURGEON:  Dene FABIENE Etienne, MD   ANESTHESIA:  Topical with tetracaine  drops and 2% Xylocaine  jelly, augmented with 1% preservative-free intracameral lidocaine .  COMPLICATIONS:  None.   DESCRIPTION OF PROCEDURE:  The patient was identified in the holding room and transported to the operating suite and placed in the supine position under the operating microscope.  The left eye was identified as the operative eye, and it was prepped and draped in the usual sterile ophthalmic fashion.    A clear-corneal paracentesis incision was made at the 1:30 position.  0.5 ml of preservative-free 1% lidocaine  was injected into the anterior chamber. The anterior chamber was filled with Viscoat.  A 2.4 millimeter near clear corneal incision was then made at the 10:30 position.  A cystotome and capsulorrhexis forceps were then used to make a curvilinear capsulorrhexis.  Hydrodissection and hydrodelineation were then performed using balanced salt solution.   Phacoemulsification was then used in stop and chop fashion to remove the lens, nucleus and epinucleus.  The remaining cortex was aspirated using the irrigation and aspiration handpiece.  Provisc viscoelastic was then placed into the capsular bag to distend it  for lens placement.  The Verion digital marker was used to align the implant at the intended axis.   A Toric lens was then injected into the capsular bag.  It was rotated clockwise until the axis marks on the lens were approximately 15 degrees in the counterclockwise direction to the intended alignment.  The viscoelastic was aspirated from the eye using the irrigation aspiration handpiece.  Then, a Koch spatula through the sideport incision was used to rotate the lens in a clockwise direction until the axis markings of the intraocular lens were lined up with the Verion alignment.  Balanced salt solution was then used to hydrate the wounds. Cefuroxime  0.1 ml of a 10mg /ml solution was injected into the anterior chamber for a dose of 1 mg of intracameral antibiotic at the completion of the case.    The eye was noted to have a physiologic pressure and there was no wound leak noted.   Timolol and Brimonidine drops were applied to the eye.  The patient was taken to the recovery room in stable condition having had no complications of anesthesia or surgery.  [name] 09/27/2024, 10:28 AM

## 2024-10-06 ENCOUNTER — Telehealth: Payer: Self-pay | Admitting: Family Medicine

## 2024-10-06 NOTE — Telephone Encounter (Signed)
 Patient walked in stating the pain clinic he is going to now is sending him to a different pain clinic, which he will not be able to get in until Feb. or March. He is requesting a medication refill be sent to the drug store before then.

## 2024-10-12 ENCOUNTER — Other Ambulatory Visit: Payer: Self-pay | Admitting: Family Medicine

## 2024-10-12 DIAGNOSIS — E1142 Type 2 diabetes mellitus with diabetic polyneuropathy: Secondary | ICD-10-CM

## 2024-10-16 ENCOUNTER — Telehealth: Payer: Self-pay | Admitting: Family Medicine

## 2024-10-16 ENCOUNTER — Telehealth: Payer: Self-pay

## 2024-10-16 ENCOUNTER — Encounter: Payer: Self-pay | Admitting: Internal Medicine

## 2024-10-16 ENCOUNTER — Other Ambulatory Visit

## 2024-10-16 ENCOUNTER — Ambulatory Visit: Admitting: Internal Medicine

## 2024-10-16 VITALS — BP 160/70 | Ht 68.0 in | Wt 258.0 lb

## 2024-10-16 DIAGNOSIS — Z7984 Long term (current) use of oral hypoglycemic drugs: Secondary | ICD-10-CM | POA: Diagnosis not present

## 2024-10-16 DIAGNOSIS — E1142 Type 2 diabetes mellitus with diabetic polyneuropathy: Secondary | ICD-10-CM | POA: Diagnosis not present

## 2024-10-16 DIAGNOSIS — E1165 Type 2 diabetes mellitus with hyperglycemia: Secondary | ICD-10-CM | POA: Diagnosis not present

## 2024-10-16 DIAGNOSIS — Z794 Long term (current) use of insulin: Secondary | ICD-10-CM

## 2024-10-16 DIAGNOSIS — E1159 Type 2 diabetes mellitus with other circulatory complications: Secondary | ICD-10-CM

## 2024-10-16 LAB — POCT GLYCOSYLATED HEMOGLOBIN (HGB A1C): Hemoglobin A1C: 8 % — AB (ref 4.0–5.6)

## 2024-10-16 MED ORDER — METFORMIN HCL ER 500 MG PO TB24: 1000.0000 mg | ORAL_TABLET | Freq: Every day | ORAL | 3 refills | Status: AC

## 2024-10-16 NOTE — Telephone Encounter (Signed)
 Attempted to reach patient regarding any need for medication refill.  Unable to reach.  Pt has an appt. scheduled on 10/27/24.

## 2024-10-16 NOTE — Patient Instructions (Addendum)
-   Increase metformin  500 mg, 2 tablets daily -Continue Farxiga  10 mg daily - Continue Basaglar  56 units daily  - Take Humalog  16 units with each meal    -Humalog  correctional insulin : ADD extra units on insulin  to your meal-time Humalog  dose if your blood sugars are higher than 155. Use the scale below to help guide you:   Blood sugar before meal Number of units to inject  Less than 155 0 unit  156 -  180 1 units  181 -  205 2 units  206 -  230 3 units  231 -  255 4 units  256 -  280 5 units  281 -  305 6 units  306 -  330 7 units  331 -  355 8 units    HOW TO TREAT LOW BLOOD SUGARS (Blood sugar LESS THAN 70 MG/DL) Please follow the RULE OF 15 for the treatment of hypoglycemia treatment (when your (blood sugars are less than 70 mg/dL)   STEP 1: Take 15 grams of carbohydrates when your blood sugar is low, which includes:  3-4 GLUCOSE TABS  OR 3-4 OZ OF JUICE OR REGULAR SODA OR ONE TUBE OF GLUCOSE GEL    STEP 2: RECHECK blood sugar in 15 MINUTES STEP 3: If your blood sugar is still low at the 15 minute recheck --> then, go back to STEP 1 and treat AGAIN with another 15 grams of carbohydrates.

## 2024-10-16 NOTE — Progress Notes (Addendum)
 "   Name: Edward Schneider.  Age/ Sex: 68 y.o., male   MRN/ DOB: [id number], 1956-08-20     PCP: Ziglar, Susan K, MD   Reason for Endocrinology Evaluation: Type 2 Diabetes Mellitus  Initial Endocrine Consultative Visit: 01/24/2019    PATIENT IDENTIFIER: [name]. is a 68 y.o. male with a past medical history of T2DM, pancreatitis 02/2022 while on Trulicity  . The patient has followed with Endocrinology clinic since 01/24/2019 for consultative assistance with management of his diabetes.  DIABETIC HISTORY:  Edward Schneider was diagnosed with T2DM in 2017. He has been on Jardiance  in 2017 but due to cost was discontinued, as well as januvia . His hemoglobin A1c has ranged from 6.2% in 2018, peaking at 10.0 % in 2019.  On his initial visit to our clinic his A1c 10.9% , he was on metformin  which we stopped in 04/2019 due to diarrhea.    Trulicity  was discontinued in May 2023 due to acute pancreatitis  S/P PCI with DES 08/2019   He was trained on OmniPod use 03/2023 but this was cost prohibitive    SUBJECTIVE:   During the last visit (03/30/2024): A1c 7.7%.     Today (10/16/2024): Edward Schneider is here for a follow up on his diabetes management. He checks his blood sugars multiple  times daily through freestyle libre.   Patient had a follow-up with cardiology for CAD, continues on medical treatment  He had establish care with GI for abdominal pain and constipation The patient did undergo left cataract surgery 09/27/2024, pending right eye cataract surgery next month Patient was noted with diabetic retinopathy in both eyes 11//2025  Constipation  has resolved  Continues with abdominal pain  No nausea    HOME DIABETES REGIMEN:   Metformin  500 mg daily  Farxiga  10 mg daily Basaglar  56 units QHS  Humalog  20 units TIDQAC Correction factor: Humalog  (BG -130/25)   CONTINUOUS GLUCOSE MONITORING RECORD INTERPRETATION    Dates of Recording:12/14-12/27/2025  Sensor description:  freestyle libre 3+  Results statistics:   CGM use % of time 100  Average and SD 174/25.1  Time in range 58 %  % Time Above 180 36  % Time above 250 6  % Time Below target 0   Glycemic patterns summary: BGs fluctuate throughout the day and night Hyperglycemic episodes postprandial  Hypoglycemic episodes occurred N/A  Overnight periods: Mostly optimal   DIABETIC COMPLICATIONS: Microvascular complications:  Neuropathy  Denies: CKD, retinopathy  Last eye exam: Completed 03/2022  Macrovascular complications:  CAD and CVA Denies: PVD    HISTORY:  Past Medical History:  Past Medical History:  Diagnosis Date   Allergy    Aortic atherosclerosis    Aortic valve sclerosis    a. 08/2021 Echo: EF 60-65%, no rwma, mild LVH, nl RV fxn, mildly dil RA, AoV sclerosis w/o stenosis.   Asthma    C. difficile diarrhea    Chronic pain    Collagenous colitis    Coronary artery disease    a. 08/2019 Cath/PCI: LM 60p/m (FFR 0.87-->2.75 x 18 Resolute Onyx DES). D1/2/3 nl, RI nl, LCX 20p/m, RCA/RPDA nl, RPAV 50. EF 55-65%.   DDD (degenerative disc disease), cervical    DDD (degenerative disc disease), lumbar    GERD (gastroesophageal reflux disease)    Grade I diastolic dysfunction    Hepatic steatosis    Hyperlipidemia    Hypertension    Liver cancer (HCC) 03/2015   Migraines    Myocardial  infarction (HCC)     OSA on CPAP    Seizures (HCC)    several as child when sick.  None since age 53   Stroke (HCC)    'mini-stroke 30 yrs ago. no deficits.   T2DM (type 2 diabetes mellitus) (HCC)    Wears dentures    full upper and lower   Past Surgical History:  Past Surgical History:  Procedure Laterality Date   APPENDECTOMY     BACK SURGERY     CARDIAC CATHETERIZATION     No stent placed in his 13's   CATARACT EXTRACTION W/PHACO Left 09/27/2024   Procedure: PHACOEMULSIFICATION, CATARACT, WITH IOL INSERTION 4.76 00:36.4;  Surgeon: Mittie Gaskin, MD;  Location: [facility];  Service: Ophthalmology;  Laterality: Left;   CERVICAL FUSION     COLONOSCOPY WITH PROPOFOL  N/A 03/06/2016   Procedure: COLONOSCOPY WITH PROPOFOL ;  Surgeon: Rogelia Copping, MD;  Location: [facility];  Service: Endoscopy;  Laterality: N/A;  requests early   COLONOSCOPY WITH PROPOFOL  N/A 06/18/2020   Procedure: COLONOSCOPY WITH PROPOFOL ;  Surgeon: Copping Rogelia, MD;  Location: [facility] END[facility]COPY;  Service: Endoscopy;  Laterality: N/A;   CORONARY PRESSURE/FFR STUDY N/A 09/05/2019   Procedure: INTRAVASCULAR PRESSURE WIRE/FFR STUDY;  Surgeon: Mady Bruckner, MD;  Location: [facility] IN[facility];  Service: Cardiovascular;  Laterality: N/A;   ESOPHAGOGASTRODUODENOSCOPY (EGD) WITH PROPOFOL  N/A 09/20/2017   Procedure: ESOPHAGOGASTRODUODENOSCOPY (EGD) WITH PROPOFOL ;  Surgeon: Copping Rogelia, MD;  Location: [facility];  Service: Endoscopy;  Laterality: N/A;  Diabetic - oral meds   FINGER SURGERY Left    KNEE SURGERY Right    LEFT HEART CATH AND CORONARY ANGIOGRAPHY Left 09/05/2019   Procedure: LEFT HEART CATH AND CORONARY ANGIOGRAPHY (2.75 x 18 mm Resolute Onyx DES x 1 to prox/mid LAD);  Surgeon: Mady Bruckner, MD;  Location: [facility] IN[facility] CV [facility];  Service: Cardiovascular;  Laterality: Left;   NECK SURGERY     spleen surgery     TOE SURGERY Right    TOTAL HIP ARTHROPLASTY Right 06/03/2021   Procedure: TOTAL HIP ARTHROPLASTY ANTERIOR APPROACH;  Surgeon: Kathlynn Sharper, MD;  Location: [facility] ORS;  Service: Orthopedics;  Laterality: Right;   TOTAL KNEE ARTHROPLASTY Right 08/22/2021   Procedure: TOTAL KNEE ARTHROPLASTY;  Surgeon: Kathlynn Sharper, MD;  Location: [facility] ORS;  Service: Orthopedics;  Laterality: Right;   Social History:  reports that he quit smoking about 15 years ago. His smoking use included cigarettes. He started smoking about 65 years ago. He has a 100 pack-year smoking history. He has been exposed to tobacco smoke. He has never used smokeless tobacco. He reports current drug use. He  reports that he does not drink alcohol. Family History:  Family History  Problem Relation Age of Onset   Arthritis Mother    Diabetes Mother    Kidney disease Mother    Heart disease Mother    Hypertension Mother    Arthritis Father    Hearing loss Father    Hypertension Father    Heart disease Father    Diabetes Sister    Heart disease Sister    Diabetes Daughter    Diabetes Maternal Aunt    Diabetes Maternal Grandmother    Heart Problems Brother    Heart Problems Brother    Heart Problems Brother    Heart attack Maternal Grandfather    Colon cancer Paternal Grandfather      HOME MEDICATIONS: Allergies as of 10/16/2024       Reactions  Bee Pollen Anaphylaxis   Died 3 times when stung by bees. Carries Epi-pen at all times. bee pollen   Bee Venom Anaphylaxis   Crestor  [rosuvastatin  Calcium ] Shortness Of Breath, Swelling   Fentanyl  Itching, Hives   blisters Patch   Shellfish Allergy Anaphylaxis, Swelling   Shrimp causes throat to swell and tingling in tongue. Shellfish (substance)   Chlorhexidine     Other Reaction(s): itching /rash   Fire Ant    Furosemide  Nausea And Vomiting   Buprenorphine Hcl Itching   Chlorhexidine  Gluconate Itching, Rash   Simvastatin Diarrhea        Medication List        Accurate as of October 16, 2024 10:44 AM. If you have any questions, ask your nurse or doctor.          Accu-Chek Guide test strip Generic drug: glucose blood Check blood sugar 2 times daily   Accu-Chek Softclix Lancets lancets Check blood sugar 2 times daily   acetaminophen  650 MG CR tablet Commonly known as: TYLENOL  Take 1,300 mg by mouth every 8 (eight) hours.   Tylenol  8 Hour 650 MG CR tablet Generic drug: acetaminophen  2 tablets as needed Orally Twice a day For Pain   albuterol  108 (90 Base) MCG/ACT inhaler Commonly known as: VENTOLIN  HFA Inhale 2 puffs into the lungs every 6 (six) hours as needed for wheezing or shortness of breath.    aspirin  EC 81 MG tablet Take 81 mg by mouth daily. Swallow whole.   atorvastatin  80 MG tablet Commonly known as: LIPITOR  TAKE 1 TABLET(80 MG) BY MOUTH AT BEDTIME   Beet Root 500 MG Caps 1 tablet Orally twice a day Daily supplement/general wellness   benazepril  10 MG tablet Commonly known as: LOTENSIN  Take 1 tablet (10 mg total) by mouth daily.   bisacodyl  5 MG EC tablet Commonly known as: Dulcolax Take 2 tablets (10 mg total) by mouth daily in the afternoon.   Breztri  Aerosphere 160-9-4.8 MCG/ACT Aero inhaler Generic drug: budesonide -glycopyrrolate -formoterol  Inhale 2 puffs into the lungs in the morning and at bedtime.   calcium  carbonate 600 MG Tabs tablet Commonly known as: OS-CAL Take 600 mg by mouth daily.   Centrum Silver 50+Men Tabs Take 1 tablet by mouth daily.   cetirizine  10 MG tablet Commonly known as: ZYRTEC  Take 1 tablet (10 mg total) by mouth daily.   Cinnamon  Plus Chromium [phone] MCG-MG Caps Generic drug: Chromium-Cinnamon  1 tablet Orally twice a day Daily supplement/metabolism support   CINNAMON  PO Take 1,000 mg by mouth 2 (two) times daily.   clobetasol  cream 0.05 % Commonly known as: TEMOVATE  Apply 1 application topically 2 (two) times daily.   Comfort EZ Pen Needles 32G X 4 MM Misc Generic drug: Insulin  Pen Needle Use to inject insulin  5 times daily   cyanocobalamin  1000 MCG tablet Commonly known as: VITAMIN B12 Take 1,000 mcg by mouth daily.   cyclobenzaprine  10 MG tablet Commonly known as: FLEXERIL  Take 1 tablet (10 mg total) by mouth 3 (three) times daily as needed for muscle spasms.   dapagliflozin  propanediol 10 MG Tabs tablet Commonly known as: Farxiga  Take 1 tablet (10 mg total) by mouth daily before breakfast.   diclofenac  Sodium 1 % Gel Commonly known as: VOLTAREN  Apply 2 g topically 4 (four) times daily as needed (pain).   diphenhydrAMINE  25 mg capsule Commonly known as: BENADRYL  Take 50 mg by mouth in the morning, at  noon, and at bedtime.   doxepin  25 MG capsule Commonly known as: SINEQUAN   Take 2 capsules (50 mg total) by mouth at bedtime.   DULoxetine  60 MG capsule Commonly known as: CYMBALTA  TAKE 1 CAPSULE(60 MG) BY MOUTH AT BEDTIME   EPINEPHrine  0.3 mg/0.3 mL Soaj injection Commonly known as: EPI-PEN Inject 0.3 mg into the muscle as needed for anaphylaxis.   Excedrin  Tension Headache 500-65 MG Tabs per tablet Generic drug: acetaminophen -caffeine  Take 1 tablet by mouth as needed.   ezetimibe  10 MG tablet Commonly known as: ZETIA  Take 1 tablet (10 mg total) by mouth daily.   famotidine  20 MG tablet Commonly known as: PEPCID  Take 1 tablet (20 mg total) by mouth 2 (two) times daily.   Flaxseed Oil 1200 MG Caps Take 1 capsule by mouth daily.   fluticasone 50 MCG/ACT nasal spray Commonly known as: FLONASE Place into both nostrils.   FreeStyle Libre 3 Plus Sensor Misc Change sensor every 15 days.   gabapentin  300 MG capsule Commonly known as: NEURONTIN  Take 300 mg by mouth 3 (three) times daily.   Ginkgo Biloba 60 MG Tabs Take 1 tablet by mouth daily.   Ginseng 100 MG Caps Take 100 mg by mouth daily.   HumaLOG  KwikPen 100 UNIT/ML KwikPen Generic drug: insulin  lispro 10 units Subcutaneous five times a day before every snack and meal. Five times a day for diabetes   hydrocortisone  25 MG suppository Commonly known as: ANUSOL -HC Place 1 suppository (25 mg total) rectally 2 (two) times daily as needed for hemorrhoids.   isosorbide  mononitrate 60 MG 24 hr tablet Commonly known as: IMDUR  Take 1 tablet (60 mg total) by mouth daily.   Lantus  100 UNIT/ML injection Generic drug: insulin  glargine Inject 54 Units into the skin at bedtime.   lidocaine  2 % solution Commonly known as: XYLOCAINE  Use as directed 15 mLs in the mouth or throat as needed for mouth pain.   metFORMIN  500 MG 24 hr tablet Commonly known as: GLUCOPHAGE -XR Take 2 tablets (1,000 mg total) by mouth daily with  breakfast. What changed: how much to take Changed by: Donell Butts, MD   Minoxidil 5 % Foam Apply 1 g topically daily.   mometasone  0.1 % ointment Commonly known as: ELOCON  Apply topically 2 (two) times daily as needed.   mupirocin  ointment 2 % Commonly known as: BACTROBAN  Apply 1 Application topically 2 (two) times daily.   Narcan  4 MG/0.1ML Liqd nasal spray kit Generic drug: naloxone  Place 0.4 mg into the nose as needed (opioid overdose). Never uses   nitroGLYCERIN  0.4 MG SL tablet Commonly known as: NITROSTAT  Place 1 tablet (0.4 mg total) under the tongue every 5 (five) minutes as needed for chest pain.   Omega-3 1000 MG Caps Take 1,000 mg by mouth daily.   ondansetron  4 MG tablet Commonly known as: ZOFRAN  TAKE 1 TABLET(4 MG) BY MOUTH EVERY 8 HOURS AS NEEDED FOR NAUSEA OR VOMITING   oxyCODONE  15 MG immediate release tablet Commonly known as: ROXICODONE  Take 1 tablet (15 mg total) by mouth every 4 (four) hours as needed. :1 Tablet(s) By Mouth Every 4-6 Hours   oxyCODONE  15 MG immediate release tablet Commonly known as: ROXICODONE  Take 1 tablet (15 mg total) by mouth every 4 (four) hours as needed. :1 Tablet(s) By Mouth Every 4-6 Hours   oxyCODONE  15 MG immediate release tablet Commonly known as: ROXICODONE  Take 1 tablet (15 mg total) by mouth every 6 (six) hours as needed. :1 Tablet(s) By Mouth Every 4-6 Hours   pantoprazole  40 MG tablet Commonly known as: PROTONIX  TAKE 1 TABLET(40 MG)  BY MOUTH TWICE DAILY   Pharmacist Choice Alcohol Pads SMARTSIG:1 Each Topical 4 Times Daily   polyethylene glycol 17 g packet Commonly known as: MIRALAX  / GLYCOLAX  Take 17 g by mouth 2 (two) times daily.   senna-docusate 8.6-50 MG tablet Commonly known as: Senokot-S Take 1 tablet by mouth 2 (two) times daily.   sucralfate  1 g tablet Commonly known as: CARAFATE  TAKE 1 TABLET BY MOUTH FOUR TIMES DAILY WITH MEALS AND AT BEDTIME   traZODone  100 MG tablet Commonly known  as: DESYREL  Take 1 tablet (100 mg total) by mouth at bedtime as needed. for sleep   triamcinolone  0.025 % cream Commonly known as: KENALOG  Apply 1 Application topically 2 (two) times daily.   vitamin A  10000 UNIT capsule Take 10,000 Units by mouth daily.   vitamin C  1000 MG tablet Take 1,000 mg by mouth daily.   Vitamin D3 25 MCG (1000 UT) Chew Chew 1,000 Units by mouth daily.   Vitamin E  400 units Tabs Take 400 Units by mouth daily.         OBJECTIVE:   Vital Signs: BP (!) 160/70   Ht 5' 8 (1.727 m)   Wt 258 lb (117 kg)   BMI 39.23 kg/m   Wt Readings from Last 3 Encounters:  10/16/24 258 lb (117 kg)  09/27/24 247 lb (112 kg)  09/04/24 253 lb (114.8 kg)     Exam: General: Pt appears well and is in NAD  Lungs: Clear with good BS bilat   Heart: RRR   Extremities: No pretibial edema.    Neuro: MS is good with appropriate affect, pt is alert and Ox3       DM foot exam: 10/16/2024 The skin of the feet is without sores or ulcerations The pedal pulses are 1+ B/L The sensation is absent to a screening 5.07, 10 gram monofilament on the right          DATA REVIEWED:  Lab Results  Component Value Date   HGBA1C 8.0 (A) 10/16/2024   HGBA1C 9.0 (A) 07/27/2024   HGBA1C 7.7 (A) 03/30/2024    Latest Reference Range & Units 05/04/24 08:54  Sodium 134 - 144 mmol/L 138  Potassium 3.5 - 5.2 mmol/L 5.2  Chloride 96 - 106 mmol/L 100  CO2 20 - 29 mmol/L 23  Glucose 70 - 99 mg/dL 777 (H)  BUN 8 - 27 mg/dL 22  Creatinine 9.23 - 8.72 mg/dL 9.03  Calcium  8.6 - 10.2 mg/dL 89.8  BUN/Creatinine Ratio 10 - 24  23  eGFR >59 mL/min/1.73 87  Alkaline Phosphatase 44 - 121 IU/L 63  Albumin 3.9 - 4.9 g/dL 4.3  AST 0 - 40 IU/L 28  ALT 0 - 44 IU/L 32  Total Protein 6.0 - 8.5 g/dL 6.9  Total Bilirubin 0.0 - 1.2 mg/dL 0.5    Latest Reference Range & Units 05/04/24 08:54  Total CHOL/HDL Ratio 0.0 - 5.0 ratio 2.5  Cholesterol, Total 100 - 199 mg/dL 876  HDL Cholesterol >60  mg/dL 50  Triglycerides 0 - 850 mg/dL 818 (H)  VLDL Cholesterol Cal 5 - 40 mg/dL 29  LDL Chol Calc (NIH) 0 - 99 mg/dL 44  (H): Data is abnormally high    ASSESSMENT / PLAN / RECOMMENDATIONS:   1) 1) Type 2 Diabetes Mellitus, Poorly  control, With neuropathic, with neuropathic and macrovascular complications - Most recent A1c of  8.0 %. Goal A1c < 7.0 %.      -Patient with worsening glycemic control,  this is attributed due to dietary indiscretions.  The patient does state that when he is on a low-carb diet, he endorses hypoglycemia.  I did explain to the patient that if he is going to lower his carbohydrate intake, he should preemptively reduce the dose of Humalog  to prevent hypoglycemia by at least 4 units or more -Trulicity  had to be discontinued due to pancreatitis in May 2023, hence all GLP-1 agonist including Mounjaro are contraindicated -He is on patient assistance program for his basal, prandial and Farxiga  -He did not qualify for the tandem pump, he was trained on the OmniPod but it was close prohibitive at $800 co-pay - Historically he has endorsed diarrhea to metformin , he was started on metformin  through PCPs office in 2025 without any issues, will increase as below - Patient was provided with new application forms for [employer] care and [employer]  MEDICATIONS:  Increase metformin  500 mg, 2 tabs daily Continue Farxiga  10 mg daily Continue Basaglar   56 units daily  Take Humalog  16 units with each meal  Continue correction factor: Humalog  (BG -130/25)     EDUCATION / INSTRUCTIONS: BG monitoring instructions: Patient is instructed to check his blood sugars 4 times a day, before meals . Call [facility] if: BG persistently < 70 I reviewed the Rule of 15 for the treatment of hypoglycemia in detail with the patient. Literature supplied.    F/U in 4 months    Signed electronically by: Stefano Redgie Butts, MD  [facility]  [facility] [address] [place] [place], [place] [address] Phone: [phone] FAX: [phone]   CC: Ziglar, Susan K, MD [address] [place] [address] Phone: [phone]  Fax: [phone]  Return to Endocrinology clinic as below: Future Appointments  Date Time Provider Department Center  10/16/2024 12:00 PM [facility] None  10/26/2024 11:15 AM Neda Jennet LABOR, MD [facility] [address]  10/27/2024 11:10 AM Ziglar, Devere POUR, MD [facility] [address]  02/15/2025  9:30 AM Roshawn Ayala, Donell Redgie, MD [facility] None    "

## 2024-10-16 NOTE — Telephone Encounter (Signed)
 Walked in stating he is out Oxycodone - Advised Dr Ziglar is out of office and she or pain clinic would have to fill this RX. He will be out of the Atorvastatin  in 7 days and Benazepril  in 3 days.  His appt is 10/27/24.  Sent to covering provider to see if she can send NON Narcotic refills.

## 2024-10-16 NOTE — Telephone Encounter (Signed)
 Walked in stating he is out Oxycodone  and Dr JENEANE was supposed to send refill. Advised have to wait and discuss narcotic refill with Dr Ziglar.  He stated he will be out of the Atorvastatin  in 7 days and Benazepril  in 3 days.  His appt is 10/27/24.  Advised will ask Evalene to send in the medications but not the Oxy as he MUST get this from pain clinic or DR Ziglar only if she approves.

## 2024-10-17 ENCOUNTER — Ambulatory Visit: Admitting: Internal Medicine

## 2024-10-17 ENCOUNTER — Emergency Department

## 2024-10-17 ENCOUNTER — Other Ambulatory Visit: Payer: Self-pay

## 2024-10-17 ENCOUNTER — Observation Stay
Admission: EM | Admit: 2024-10-17 | Discharge: 2024-10-18 | Disposition: A | Discharge status: Home / Self Care | Source: Home / Self Care | Attending: Emergency Medicine | Admitting: Emergency Medicine

## 2024-10-17 ENCOUNTER — Ambulatory Visit: Payer: Self-pay | Admitting: Internal Medicine

## 2024-10-17 DIAGNOSIS — J45909 Unspecified asthma, uncomplicated: Secondary | ICD-10-CM | POA: Diagnosis not present

## 2024-10-17 DIAGNOSIS — Z96651 Presence of right artificial knee joint: Secondary | ICD-10-CM | POA: Diagnosis not present

## 2024-10-17 DIAGNOSIS — Z87891 Personal history of nicotine dependence: Secondary | ICD-10-CM | POA: Insufficient documentation

## 2024-10-17 DIAGNOSIS — Z96641 Presence of right artificial hip joint: Secondary | ICD-10-CM | POA: Insufficient documentation

## 2024-10-17 DIAGNOSIS — I25118 Atherosclerotic heart disease of native coronary artery with other forms of angina pectoris: Secondary | ICD-10-CM

## 2024-10-17 DIAGNOSIS — K219 Gastro-esophageal reflux disease without esophagitis: Secondary | ICD-10-CM | POA: Diagnosis not present

## 2024-10-17 DIAGNOSIS — G4733 Obstructive sleep apnea (adult) (pediatric): Secondary | ICD-10-CM | POA: Diagnosis not present

## 2024-10-17 DIAGNOSIS — I10 Essential (primary) hypertension: Secondary | ICD-10-CM | POA: Insufficient documentation

## 2024-10-17 DIAGNOSIS — E785 Hyperlipidemia, unspecified: Secondary | ICD-10-CM | POA: Insufficient documentation

## 2024-10-17 DIAGNOSIS — I2510 Atherosclerotic heart disease of native coronary artery without angina pectoris: Secondary | ICD-10-CM | POA: Diagnosis not present

## 2024-10-17 DIAGNOSIS — R079 Chest pain, unspecified: Secondary | ICD-10-CM | POA: Diagnosis not present

## 2024-10-17 DIAGNOSIS — R52 Pain, unspecified: Secondary | ICD-10-CM

## 2024-10-17 DIAGNOSIS — Z79899 Other long term (current) drug therapy: Secondary | ICD-10-CM | POA: Insufficient documentation

## 2024-10-17 DIAGNOSIS — Z8505 Personal history of malignant neoplasm of liver: Secondary | ICD-10-CM | POA: Diagnosis not present

## 2024-10-17 DIAGNOSIS — F1120 Opioid dependence, uncomplicated: Secondary | ICD-10-CM

## 2024-10-17 DIAGNOSIS — E1142 Type 2 diabetes mellitus with diabetic polyneuropathy: Secondary | ICD-10-CM | POA: Diagnosis not present

## 2024-10-17 LAB — CBC WITH DIFFERENTIAL/PLATELET
Abs Immature Granulocytes: 0.03 K/uL (ref 0.00–0.07)
Basophils Absolute: 0.1 K/uL (ref 0.0–0.1)
Basophils Relative: 1 %
Eosinophils Absolute: 0.4 K/uL (ref 0.0–0.5)
Eosinophils Relative: 4 %
HCT: 37.2 % — ABNORMAL LOW (ref 39.0–52.0)
Hemoglobin: 11.9 g/dL — ABNORMAL LOW (ref 13.0–17.0)
Immature Granulocytes: 0 %
Lymphocytes Relative: 27 %
Lymphs Abs: 2.5 K/uL (ref 0.7–4.0)
MCH: 26.7 pg (ref 26.0–34.0)
MCHC: 32 g/dL (ref 30.0–36.0)
MCV: 83.6 fL (ref 80.0–100.0)
Monocytes Absolute: 1.1 K/uL — ABNORMAL HIGH (ref 0.1–1.0)
Monocytes Relative: 12 %
Neutro Abs: 5.2 K/uL (ref 1.7–7.7)
Neutrophils Relative %: 56 %
Platelets: 196 K/uL (ref 150–400)
RBC: 4.45 MIL/uL (ref 4.22–5.81)
RDW: 15.7 % — ABNORMAL HIGH (ref 11.5–15.5)
WBC: 9.2 K/uL (ref 4.0–10.5)
nRBC: 0 % (ref 0.0–0.2)

## 2024-10-17 LAB — COMPREHENSIVE METABOLIC PANEL WITH GFR
ALT: 37 U/L (ref 0–44)
AST: 36 U/L (ref 15–41)
Albumin: 4.4 g/dL (ref 3.5–5.0)
Alkaline Phosphatase: 66 U/L (ref 38–126)
Anion gap: 13 (ref 5–15)
BUN: 23 mg/dL (ref 8–23)
CO2: 24 mmol/L (ref 22–32)
Calcium: 10.1 mg/dL (ref 8.9–10.3)
Chloride: 103 mmol/L (ref 98–111)
Creatinine, Ser: 1.1 mg/dL (ref 0.61–1.24)
GFR, Estimated: >60 mL/min
Glucose, Bld: 190 mg/dL — ABNORMAL HIGH (ref 70–99)
Potassium: 4.8 mmol/L (ref 3.5–5.1)
Sodium: 139 mmol/L (ref 135–145)
Total Bilirubin: 0.6 mg/dL (ref 0.0–1.2)
Total Protein: 7.2 g/dL (ref 6.5–8.1)

## 2024-10-17 LAB — PRO BRAIN NATRIURETIC PEPTIDE: Pro Brain Natriuretic Peptide: <50 pg/mL

## 2024-10-17 LAB — TROPONIN T, HIGH SENSITIVITY
Troponin T High Sensitivity: <15 ng/L (ref 0–19)
Troponin T High Sensitivity: <15 ng/L (ref 0–19)

## 2024-10-17 LAB — MICROALBUMIN / CREATININE URINE RATIO
Creatinine, Urine: 101 mg/dL (ref 20–320)
Microalb Creat Ratio: 9 mg/g{creat}
Microalb, Ur: 0.9 mg/dL

## 2024-10-17 LAB — LIPASE, BLOOD: Lipase: 55 U/L — ABNORMAL HIGH (ref 11–51)

## 2024-10-17 MED ORDER — ACETAMINOPHEN 325 MG PO TABS
650.0000 mg | ORAL_TABLET | ORAL | Status: DC | PRN
  Administered 2024-10-18 (×2): 650 mg via ORAL
  Filled 2024-10-17 (×2): qty 2

## 2024-10-17 MED ORDER — MORPHINE SULFATE (PF) 4 MG/ML IV SOLN
4.0000 mg | Freq: Once | INTRAVENOUS | Status: AC
  Administered 2024-10-17: 4 mg via INTRAVENOUS
  Filled 2024-10-17: qty 1

## 2024-10-17 MED ORDER — SODIUM CHLORIDE 0.9 % IV SOLN: INTRAVENOUS | Status: DC

## 2024-10-17 MED ORDER — ISOSORBIDE MONONITRATE ER 60 MG PO TB24
60.0000 mg | ORAL_TABLET | Freq: Every day | ORAL | Status: DC
  Administered 2024-10-18: 60 mg via ORAL
  Filled 2024-10-17: qty 1

## 2024-10-17 MED ORDER — BISACODYL 5 MG PO TBEC
10.0000 mg | DELAYED_RELEASE_TABLET | Freq: Every day | ORAL | Status: DC
  Filled 2024-10-17: qty 2

## 2024-10-17 MED ORDER — DAPAGLIFLOZIN PROPANEDIOL 10 MG PO TABS
10.0000 mg | ORAL_TABLET | Freq: Every day | ORAL | Status: DC
  Administered 2024-10-18: 10 mg via ORAL
  Filled 2024-10-17: qty 1

## 2024-10-17 MED ORDER — FAMOTIDINE 20 MG PO TABS
20.0000 mg | ORAL_TABLET | Freq: Two times a day (BID) | ORAL | Status: DC
  Administered 2024-10-18 (×2): 20 mg via ORAL
  Filled 2024-10-17 (×2): qty 1

## 2024-10-17 MED ORDER — VITAMIN C 500 MG PO TABS
1000.0000 mg | ORAL_TABLET | Freq: Every day | ORAL | Status: DC
  Administered 2024-10-18: 1000 mg via ORAL
  Filled 2024-10-17: qty 2

## 2024-10-17 MED ORDER — LIDOCAINE VISCOUS HCL 2 % MT SOLN: 15.0000 mL | OROMUCOSAL | Status: DC | PRN

## 2024-10-17 MED ORDER — OXYCODONE HCL 5 MG PO TABS
15.0000 mg | ORAL_TABLET | ORAL | Status: DC | PRN
  Administered 2024-10-18 (×2): 15 mg via ORAL
  Filled 2024-10-17 (×3): qty 3

## 2024-10-17 MED ORDER — ADULT MULTIVITAMIN W/MINERALS CH
1.0000 | ORAL_TABLET | Freq: Every day | ORAL | Status: DC
  Administered 2024-10-18: 1 via ORAL
  Filled 2024-10-17: qty 1

## 2024-10-17 MED ORDER — POLYETHYLENE GLYCOL 3350 17 G PO PACK
17.0000 g | PACK | Freq: Two times a day (BID) | ORAL | Status: DC
  Administered 2024-10-18: 17 g via ORAL
  Filled 2024-10-17: qty 1

## 2024-10-17 MED ORDER — CALCIUM CARBONATE ANTACID 500 MG PO CHEW
600.0000 mg | CHEWABLE_TABLET | Freq: Every day | ORAL | Status: DC
  Administered 2024-10-18: 600 mg via ORAL
  Filled 2024-10-17: qty 3

## 2024-10-17 MED ORDER — DOXEPIN HCL 50 MG PO CAPS
50.0000 mg | ORAL_CAPSULE | Freq: Every day | ORAL | Status: DC
  Administered 2024-10-18: 50 mg via ORAL
  Filled 2024-10-17 (×2): qty 1

## 2024-10-17 MED ORDER — BENAZEPRIL HCL 10 MG PO TABS
10.0000 mg | ORAL_TABLET | Freq: Every day | ORAL | Status: DC
  Administered 2024-10-18: 10 mg via ORAL
  Filled 2024-10-17: qty 1

## 2024-10-17 MED ORDER — VITAMIN D 25 MCG (1000 UNIT) PO TABS
1000.0000 [IU] | ORAL_TABLET | Freq: Every day | ORAL | Status: DC
  Administered 2024-10-18: 1000 [IU] via ORAL
  Filled 2024-10-17: qty 1

## 2024-10-17 MED ORDER — SENNOSIDES-DOCUSATE SODIUM 8.6-50 MG PO TABS
1.0000 | ORAL_TABLET | Freq: Two times a day (BID) | ORAL | Status: DC
  Filled 2024-10-17: qty 1

## 2024-10-17 MED ORDER — IOHEXOL 350 MG/ML SOLN
125.0000 mL | Freq: Once | INTRAVENOUS | Status: AC | PRN
  Administered 2024-10-17: 125 mL via INTRAVENOUS

## 2024-10-17 MED ORDER — CYCLOBENZAPRINE HCL 10 MG PO TABS: 10.0000 mg | ORAL_TABLET | Freq: Three times a day (TID) | ORAL | Status: DC | PRN

## 2024-10-17 MED ORDER — NITROGLYCERIN 0.4 MG SL SUBL: 0.4000 mg | SUBLINGUAL_TABLET | SUBLINGUAL | Status: DC | PRN

## 2024-10-17 MED ORDER — TRAZODONE HCL 100 MG PO TABS
100.0000 mg | ORAL_TABLET | Freq: Every evening | ORAL | Status: DC | PRN
  Administered 2024-10-18: 100 mg via ORAL
  Filled 2024-10-17: qty 1

## 2024-10-17 MED ORDER — ALPRAZOLAM 0.25 MG PO TABS
0.2500 mg | ORAL_TABLET | Freq: Two times a day (BID) | ORAL | Status: DC | PRN
  Filled 2024-10-17: qty 1

## 2024-10-17 MED ORDER — LORATADINE 10 MG PO TABS
10.0000 mg | ORAL_TABLET | Freq: Every day | ORAL | Status: DC
  Administered 2024-10-18: 10 mg via ORAL
  Filled 2024-10-17: qty 1

## 2024-10-17 MED ORDER — ENOXAPARIN SODIUM 60 MG/0.6ML IJ SOSY
0.5000 mg/kg | PREFILLED_SYRINGE | INTRAMUSCULAR | Status: DC
  Administered 2024-10-18: 57.5 mg via SUBCUTANEOUS
  Filled 2024-10-17: qty 0.6

## 2024-10-17 MED ORDER — ASPIRIN 81 MG PO TBEC
81.0000 mg | DELAYED_RELEASE_TABLET | Freq: Every day | ORAL | Status: DC
  Administered 2024-10-18: 81 mg via ORAL
  Filled 2024-10-17: qty 1

## 2024-10-17 MED ORDER — VITAMIN B-12 1000 MCG PO TABS
1000.0000 ug | ORAL_TABLET | Freq: Every day | ORAL | Status: DC
  Administered 2024-10-18: 1000 ug via ORAL
  Filled 2024-10-17: qty 1

## 2024-10-17 MED ORDER — CINNAMON 500 MG PO CAPS: 1000.0000 mg | ORAL_CAPSULE | Freq: Two times a day (BID) | ORAL | Status: DC

## 2024-10-17 MED ORDER — DULOXETINE HCL 30 MG PO CPEP
60.0000 mg | ORAL_CAPSULE | Freq: Every day | ORAL | Status: DC
  Administered 2024-10-18: 60 mg via ORAL
  Filled 2024-10-17: qty 2

## 2024-10-17 MED ORDER — VITAMIN E 45 MG (100 UNIT) PO CAPS
400.0000 [IU] | ORAL_CAPSULE | Freq: Every day | ORAL | Status: DC
  Administered 2024-10-18: 400 [IU] via ORAL
  Filled 2024-10-17: qty 4

## 2024-10-17 MED ORDER — OMEGA-3-ACID ETHYL ESTERS 1 G PO CAPS
1000.0000 mg | ORAL_CAPSULE | Freq: Every day | ORAL | Status: DC
  Administered 2024-10-18: 1000 mg via ORAL
  Filled 2024-10-17: qty 1

## 2024-10-17 MED ORDER — PANTOPRAZOLE SODIUM 40 MG PO TBEC
40.0000 mg | DELAYED_RELEASE_TABLET | Freq: Two times a day (BID) | ORAL | Status: DC
  Administered 2024-10-18 (×2): 40 mg via ORAL
  Filled 2024-10-17 (×2): qty 1

## 2024-10-17 MED ORDER — HYDROMORPHONE HCL 1 MG/ML IJ SOLN: 1.0000 mg | Freq: Once | INTRAMUSCULAR | Status: DC

## 2024-10-17 MED ORDER — ATORVASTATIN CALCIUM 80 MG PO TABS
80.0000 mg | ORAL_TABLET | Freq: Every day | ORAL | Status: DC
  Administered 2024-10-18: 80 mg via ORAL
  Filled 2024-10-17: qty 1

## 2024-10-17 MED ORDER — MAGNESIUM HYDROXIDE 400 MG/5ML PO SUSP: 30.0000 mL | Freq: Every day | ORAL | Status: DC | PRN

## 2024-10-17 MED ORDER — ONDANSETRON HCL 4 MG/2ML IJ SOLN: 4.0000 mg | Freq: Four times a day (QID) | INTRAMUSCULAR | Status: DC | PRN

## 2024-10-17 MED ORDER — EZETIMIBE 10 MG PO TABS
10.0000 mg | ORAL_TABLET | Freq: Every day | ORAL | Status: DC
  Administered 2024-10-18: 10 mg via ORAL
  Filled 2024-10-17: qty 1

## 2024-10-17 MED ORDER — INSULIN GLARGINE 100 UNIT/ML ~~LOC~~ SOLN
54.0000 [IU] | Freq: Every day | SUBCUTANEOUS | Status: DC
  Administered 2024-10-18: 54 [IU] via SUBCUTANEOUS
  Filled 2024-10-17 (×2): qty 0.54

## 2024-10-17 MED ORDER — ASPIRIN 81 MG PO TBEC: 81.0000 mg | DELAYED_RELEASE_TABLET | Freq: Every day | ORAL | Status: DC

## 2024-10-17 MED ORDER — GABAPENTIN 300 MG PO CAPS
300.0000 mg | ORAL_CAPSULE | Freq: Three times a day (TID) | ORAL | Status: DC
  Administered 2024-10-18 (×2): 300 mg via ORAL
  Filled 2024-10-17 (×2): qty 1

## 2024-10-17 MED ORDER — DICLOFENAC SODIUM 1 % EX GEL: 2.0000 g | Freq: Four times a day (QID) | CUTANEOUS | Status: DC | PRN

## 2024-10-17 NOTE — H&P (Incomplete)
 "     [facility]   PATIENT NAME: Edward Schneider    MR#:  [id number]  DATE OF BIRTH:  [birth year]  DATE OF ADMISSION:  10/17/2024  PRIMARY CARE PHYSICIAN: Ziglar, Susan K, MD   Patient is coming from: Home  REQUESTING/REFERRING PHYSICIAN: Siadecki, Sebastien, MD  CHIEF COMPLAINT:   Chief Complaint  Patient presents with   Chest Pain    HISTORY OF PRESENT ILLNESS:  Micael Barb. is a 68 y.o. Caucasian male with medical history significant for asthma, chronic pain, collagenous colitis, coronary artery disease GERD, DDD, hypertension, dyslipidemia, migraine and seizuresType 2 diabetes mellitus, who presented to the emergency room with acute onset of with the patient came to the ER as well as midsternal chest pain felt as pressure and tightness with radiation to his left arm.  He graded as 10/10 in severity and had associated nausea and diaphoresis with no vomiting.  He admitted to dyspnea and palpitations.  Today he fell asleep in the chair and then woke up with dyspnea.  He admitted to mild cough and wheezing as well.  He has been having diarrhea without melena or bright red bleeding per rectum.  No dysuria, oliguria or hematuria or flank pain.  No fever or chills.    ED Course: When the patient came to the ER, BP was 133/91 with otherwise normal vital signs.  Labs revealed a blood glucose of 190 and lipase of 55 with otherwise unremarkable CMP.  proBNP was less than 50 and high sensitive troponin T was less than 15 twice.  CBC showed hemoglobin 11.9 hematocrit 37.2. EKG as reviewed by me :  EKG showed normal sinus rhythm with a rate of 78 with prolonged PR interval. Imaging: 2 view chest x-ray showed no acute cardiopulmonary disease.  CT of the chest, abdomen and pelvis with and without contrast revealed no evidence for acute aortic syndrome.  It showed coronary and aortic atherosclerosis.  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  The patient was given 4 mg of IV morphine  sulfate twice.  He will be admitted to an the patient telemetry bed for further evaluation and management. PAST MEDICAL HISTORY:   Past Medical History:  Diagnosis Date   Allergy    Aortic atherosclerosis    Aortic valve sclerosis    a. 08/2021 Echo: EF 60-65%, no rwma, mild LVH, nl RV fxn, mildly dil RA, AoV sclerosis w/o stenosis.   Asthma    C. difficile diarrhea    Chronic pain    Collagenous colitis    Coronary artery disease    a. 08/2019 Cath/PCI: LM 60p/m (FFR 0.87-->2.75 x 18 Resolute Onyx DES). D1/2/3 nl, RI nl, LCX 20p/m, RCA/RPDA nl, RPAV 50. EF 55-65%.   DDD (degenerative disc disease), cervical    DDD (degenerative disc disease), lumbar    GERD (gastroesophageal reflux disease)    Grade I diastolic dysfunction    Hepatic steatosis    Hyperlipidemia    Hypertension    Liver cancer (HCC) 03/2015   Migraines    Myocardial infarction (HCC)     OSA on CPAP    Seizures (HCC)    several as child when sick.  None since age 8   Stroke (HCC)    'mini-stroke 30 yrs ago. no deficits.   T2DM (type 2 diabetes mellitus) (HCC)    Wears dentures    full upper  and lower    PAST SURGICAL HISTORY:   Past Surgical History:  Procedure Laterality Date   APPENDECTOMY     BACK SURGERY     CARDIAC CATHETERIZATION     No stent placed in his 69's   CATARACT EXTRACTION W/PHACO Left 09/27/2024   Procedure: PHACOEMULSIFICATION, CATARACT, WITH IOL INSERTION 4.76 00:36.4;  Surgeon: Mittie Gaskin, MD;  Location: [facility];  Service: Ophthalmology;  Laterality: Left;   CERVICAL FUSION     COLONOSCOPY WITH PROPOFOL  N/A 03/06/2016   Procedure: COLONOSCOPY WITH PROPOFOL ;  Surgeon: Rogelia Copping, MD;  Location: [facility];  Service: Endoscopy;  Laterality: N/A;  requests early   COLONOSCOPY WITH PROPOFOL  N/A 06/18/2020   Procedure: COLONOSCOPY WITH PROPOFOL ;  Surgeon:  Copping Rogelia, MD;  Location: [facility] ENDOSCOPY;  Service: Endoscopy;  Laterality: N/A;   CORONARY PRESSURE/FFR STUDY N/A 09/05/2019   Procedure: INTRAVASCULAR PRESSURE WIRE/FFR STUDY;  Surgeon: Mady Bruckner, MD;  Location: [facility] INVASIVE CV LAB;  Service: Cardiovascular;  Laterality: N/A;   ESOPHAGOGASTRODUODENOSCOPY (EGD) WITH PROPOFOL  N/A 09/20/2017   Procedure: ESOPHAGOGASTRODUODENOSCOPY (EGD) WITH PROPOFOL ;  Surgeon: Copping Rogelia, MD;  Location: [facility];  Service: Endoscopy;  Laterality: N/A;  Diabetic - oral meds   FINGER SURGERY Left    KNEE SURGERY Right    LEFT HEART CATH AND CORONARY ANGIOGRAPHY Left 09/05/2019   Procedure: LEFT HEART CATH AND CORONARY ANGIOGRAPHY (2.75 x 18 mm Resolute Onyx DES x 1 to prox/mid LAD);  Surgeon: Mady Bruckner, MD;  Location: [facility] INVASIVE CV LAB;  Service: Cardiovascular;  Laterality: Left;   NECK SURGERY     spleen surgery     TOE SURGERY Right    TOTAL HIP ARTHROPLASTY Right 06/03/2021   Procedure: TOTAL HIP ARTHROPLASTY ANTERIOR APPROACH;  Surgeon: Kathlynn Sharper, MD;  Location: [facility] ORS;  Service: Orthopedics;  Laterality: Right;   TOTAL KNEE ARTHROPLASTY Right 08/22/2021   Procedure: TOTAL KNEE ARTHROPLASTY;  Surgeon: Kathlynn Sharper, MD;  Location: [facility] ORS;  Service: Orthopedics;  Laterality: Right;    SOCIAL HISTORY:   Social History   Tobacco Use   Smoking status: Former    Current packs/day: 0.00    Average packs/day: 2.0 packs/day for 50.0 years (100.0 ttl pk-yrs)    Types: Cigarettes    Start date: 76    Quit date: 2011    Years since quitting: 15.0    Passive exposure: Past   Smokeless tobacco: Never  Substance Use Topics   Alcohol use: No    Alcohol/week: 0.0 standard drinks of alcohol    FAMILY HISTORY:   Family History  Problem Relation Age of Onset   Arthritis Mother    Diabetes Mother    Kidney disease Mother    Heart disease Mother    Hypertension Mother    Arthritis Father    Hearing loss Father     Hypertension Father    Heart disease Father    Diabetes Sister    Heart disease Sister    Diabetes Daughter    Diabetes Maternal Aunt    Diabetes Maternal Grandmother    Heart Problems Brother    Heart Problems Brother    Heart Problems Brother    Heart attack Maternal Grandfather    Colon cancer Paternal Grandfather     DRUG ALLERGIES:  Allergies[1]  REVIEW OF SYSTEMS:   ROS As per history of present illness. All pertinent systems were reviewed above. Constitutional, HEENT, cardiovascular, respiratory, GI, GU, musculoskeletal, neuro, psychiatric, endocrine, integumentary and hematologic systems  were reviewed and are otherwise negative/unremarkable except for positive findings mentioned above in the HPI.   MEDICATIONS AT HOME:   Prior to Admission medications  Medication Sig Start Date End Date Taking? Authorizing Provider  Accu-Chek Softclix Lancets lancets Check blood sugar 2 times daily 04/06/23   Shamleffer, Ibtehal Jaralla, MD  acetaminophen  (TYLENOL  8 HOUR) 650 MG CR tablet 2 tablets as needed Orally Twice a day For Pain    [provider]  acetaminophen  (TYLENOL ) 650 MG CR tablet Take 1,300 mg by mouth every 8 (eight) hours.    [provider]  acetaminophen -caffeine  (EXCEDRIN  TENSION HEADACHE) 500-65 MG TABS per tablet Take 1 tablet by mouth as needed.    [provider]  albuterol  (VENTOLIN  HFA) 108 (90 Base) MCG/ACT inhaler Inhale 2 puffs into the lungs every 6 (six) hours as needed for wheezing or shortness of breath. 01/24/24   Neda Jennet LABOR, MD  Alcohol Swabs (PHARMACIST CHOICE ALCOHOL) PADS SMARTSIG:1 Each Topical 4 Times Daily 09/18/21   [provider]  Ascorbic Acid  (VITAMIN C ) 1000 MG tablet Take 1,000 mg by mouth daily.     [provider]  aspirin  EC 81 MG tablet Take 81 mg by mouth daily. Swallow whole.    [provider]  atorvastatin  (LIPITOR ) 80 MG tablet TAKE 1 TABLET(80 MG) BY MOUTH AT BEDTIME  05/07/23   Cannady, Jolene T, NP  benazepril  (LOTENSIN ) 10 MG tablet Take 1 tablet (10 mg total) by mouth daily. 07/08/22   Cannady, Jolene T, NP  bisacodyl  (DULCOLAX) 5 MG EC tablet Take 2 tablets (10 mg total) by mouth daily in the afternoon. 03/06/22   Patel, Sona, MD  budeson-glycopyrrolate -formoterol  (BREZTRI  AEROSPHERE) 160-9-4.8 MCG/ACT AERO Inhale 2 puffs into the lungs in the morning and at bedtime. 01/24/24   Neda Jennet LABOR, MD  calcium  carbonate (OS-CAL) 600 MG TABS tablet Take 600 mg by mouth daily.     [provider]  cetirizine  (ZYRTEC ) 10 MG tablet Take 1 tablet (10 mg total) by mouth daily. 04/17/22   Cannady, Jolene T, NP  Cholecalciferol  (VITAMIN D3) 25 MCG (1000 UT) CHEW Chew 1,000 Units by mouth daily.     [provider]  Chromium-Cinnamon  (CINNAMON  PLUS CHROMIUM) [phone] MCG-MG CAPS 1 tablet Orally twice a day Daily supplement/metabolism support    [provider]  CINNAMON  PO Take 1,000 mg by mouth 2 (two) times daily.     [provider]  clobetasol  cream (TEMOVATE ) 0.05 % Apply 1 application topically 2 (two) times daily. 07/17/20   Daphane Rosella, NP  COMFORT EZ PEN NEEDLES 32G X 4 MM MISC Use to inject insulin  5 times daily 01/18/23   Shamleffer, Ibtehal Jaralla, MD  Continuous Glucose Sensor (FREESTYLE LIBRE 3 PLUS SENSOR) MISC Change sensor every 15 days. 03/30/24   Shamleffer, Ibtehal Jaralla, MD  cyclobenzaprine  (FLEXERIL ) 10 MG tablet Take 1 tablet (10 mg total) by mouth 3 (three) times daily as needed for muscle spasms. 01/07/18   Jinny Carmine, MD  dapagliflozin  propanediol (FARXIGA ) 10 MG TABS tablet Take 1 tablet (10 mg total) by mouth daily before breakfast. 04/17/22   Cannady, Jolene T, NP  diclofenac  Sodium (VOLTAREN ) 1 % GEL Apply 2 g topically 4 (four) times daily as needed (pain).  07/10/20   [provider]  diphenhydrAMINE  (BENADRYL ) 25 mg capsule Take 50 mg by mouth in the morning, at noon, and at bedtime.    [provider]  doxepin  (SINEQUAN ) 25 MG capsule Take 2  capsules (50 mg total) by mouth at bedtime. 07/08/22   Cannady, Jolene T, NP  DULoxetine  (CYMBALTA ) 60 MG capsule TAKE 1 CAPSULE(60 MG) BY MOUTH AT BEDTIME 10/13/24   Ziglar, Devere POUR, MD  EPINEPHrine  0.3 mg/0.3 mL IJ SOAJ injection Inject 0.3 mg into the muscle as needed for anaphylaxis. 12/12/21   Johnson, Megan P, DO  ezetimibe  (ZETIA ) 10 MG tablet Take 1 tablet (10 mg total) by mouth daily. 04/17/22   Cannady, Jolene T, NP  famotidine  (PEPCID ) 20 MG tablet Take 1 tablet (20 mg total) by mouth 2 (two) times daily. 06/13/24   Ziglar, Susan K, MD  Flaxseed, Linseed, (FLAXSEED OIL) 1200 MG CAPS Take 1 capsule by mouth daily.    [provider]  fluticasone (FLONASE) 50 MCG/ACT nasal spray Place into both nostrils. 11/16/23   [provider]  gabapentin  (NEURONTIN ) 300 MG capsule Take 300 mg by mouth 3 (three) times daily.    [provider]  Ginkgo Biloba 60 MG TABS Take 1 tablet by mouth daily.    [provider]  Ginseng 100 MG CAPS Take 100 mg by mouth daily.     [provider]  glucose blood (ACCU-CHEK GUIDE) test strip Check blood sugar 2 times daily 04/06/23   Shamleffer, Ibtehal Jaralla, MD  hydrocortisone  (ANUSOL -HC) 25 MG suppository Place 1 suppository (25 mg total) rectally 2 (two) times daily as needed for hemorrhoids. 09/04/24   Ziglar, Susan K, MD  insulin  lispro (HUMALOG  KWIKPEN) 100 UNIT/ML KwikPen 10 units Subcutaneous five times a day before every snack and meal. Five times a day for diabetes    [provider]  isosorbide  mononitrate (IMDUR ) 60 MG 24 hr tablet Take 1 tablet (60 mg total) by mouth daily. 11/03/22   Cannady, Jolene T, NP  LANTUS  100 UNIT/ML injection Inject 54 Units into the skin at bedtime. 06/04/23   [provider]  lidocaine  (XYLOCAINE ) 2 % solution Use as directed 15 mLs in the mouth or throat as needed for mouth pain. 12/12/21   Vicci Bouchard P, DO   metFORMIN  (GLUCOPHAGE -XR) 500 MG 24 hr tablet Take 2 tablets (1,000 mg total) by mouth daily with breakfast. 10/16/24   Shamleffer, Ibtehal Jaralla, MD  Minoxidil 5 % FOAM Apply 1 g topically daily. 11/23/22   [provider]  Misc Natural Products (BEET ROOT) 500 MG CAPS 1 tablet Orally twice a day Daily supplement/general wellness    [provider]  mometasone  (ELOCON ) 0.1 % ointment Apply topically 2 (two) times daily as needed. 12/23/21   [provider]  Multiple Vitamins-Minerals (CENTRUM SILVER 50+MEN) TABS Take 1 tablet by mouth daily.    [provider]  mupirocin  ointment (BACTROBAN ) 2 % Apply 1 Application topically 2 (two) times daily. 07/02/22   Cannady, Jolene T, NP  NARCAN  4 MG/0.1ML LIQD nasal spray kit Place 0.4 mg into the nose as needed (opioid overdose). Never uses 02/20/19   [provider]  nitroGLYCERIN  (NITROSTAT ) 0.4 MG SL tablet Place 1 tablet (0.4 mg total) under the tongue every 5 (five) minutes as needed for chest pain. 06/13/24   Ziglar, Susan K, MD  Omega-3 1000 MG CAPS Take 1,000 mg by mouth daily.     [provider]  ondansetron  (ZOFRAN ) 4 MG tablet TAKE 1 TABLET(4 MG) BY MOUTH EVERY 8 HOURS AS NEEDED FOR NAUSEA OR VOMITING 01/22/23   Cannady, Jolene T, NP  oxyCODONE  (ROXICODONE ) 15 MG immediate release tablet Take 1 tablet (15 mg total) by  mouth every 4 (four) hours as needed. :1 Tablet(s) By Mouth Every 4-6 Hours 08/26/24   Ziglar, Susan K, MD  oxyCODONE  (ROXICODONE ) 15 MG immediate release tablet Take 1 tablet (15 mg total) by mouth every 4 (four) hours as needed. :1 Tablet(s) By Mouth Every 4-6 Hours 07/28/24   Ziglar, Susan K, MD  oxyCODONE  (ROXICODONE ) 15 MG immediate release tablet Take 1 tablet (15 mg total) by mouth every 6 (six) hours as needed. :1 Tablet(s) By Mouth Every 4-6 Hours 09/17/24   Ziglar, Susan K, MD  pantoprazole  (PROTONIX ) 40 MG tablet TAKE 1 TABLET(40 MG) BY MOUTH TWICE DAILY 06/01/22   Cannady,  Jolene T, NP  polyethylene glycol (MIRALAX  / GLYCOLAX ) 17 g packet Take 17 g by mouth 2 (two) times daily. 03/18/22   Cannady, Jolene T, NP  senna-docusate (SENOKOT-S) 8.6-50 MG tablet Take 1 tablet by mouth 2 (two) times daily. 03/18/22   Cannady, Jolene T, NP  sucralfate  (CARAFATE ) 1 g tablet TAKE 1 TABLET BY MOUTH FOUR TIMES DAILY WITH MEALS AND AT BEDTIME 07/26/24   Ziglar, Susan K, MD  traZODone  (DESYREL ) 100 MG tablet Take 1 tablet (100 mg total) by mouth at bedtime as needed. for sleep 04/17/22   Cannady, Jolene T, NP  triamcinolone  (KENALOG ) 0.025 % cream Apply 1 Application topically 2 (two) times daily. 08/26/24   [provider]  vitamin A  10000 UNIT capsule Take 10,000 Units by mouth daily.    [provider]  vitamin B-12 (CYANOCOBALAMIN ) 1000 MCG tablet Take 1,000 mcg by mouth daily.    [provider]  Vitamin E  400 units TABS Take 400 Units by mouth daily.     [provider]      VITAL SIGNS:  Blood pressure (!) 143/78, pulse 65, temperature 98.7 F (37.1 C), resp. rate 18, height 5' 8 (1.727 m), weight 114.4 kg, SpO2 99%.  PHYSICAL EXAMINATION:  Physical Exam  GENERAL:  68 y.o.-year-old patient lying in the bed with no acute distress.  EYES: Pupils equal, round, reactive to light and accommodation. No scleral icterus. Extraocular muscles intact.  HEENT: Head atraumatic, normocephalic. Oropharynx and nasopharynx clear.  NECK:  Supple, no jugular venous distention. No thyroid  enlargement, no tenderness.  LUNGS: Normal breath sounds bilaterally, no wheezing, rales,rhonchi or crepitation. No use of accessory muscles of respiration.  CARDIOVASCULAR: Regular rate and rhythm, S1, S2 normal. No murmurs, rubs, or gallops.  ABDOMEN: Soft, nondistended, nontender. Bowel sounds present. No organomegaly or mass.  EXTREMITIES: No pedal edema, cyanosis, or clubbing.  NEUROLOGIC: Cranial nerves II through XII are intact. Muscle strength 5/5 in all  extremities. Sensation intact. Gait not checked.  PSYCHIATRIC: The patient is alert and oriented x 3.  Normal affect and good eye contact. SKIN: No obvious rash, lesion, or ulcer.   LABORATORY PANEL:   CBC Recent Labs  Lab 10/17/24 1943  WBC 9.2  HGB 11.9*  HCT 37.2*  PLT 196   ------------------------------------------------------------------------------------------------------------------  Chemistries  Recent Labs  Lab 10/17/24 1943  NA 139  K 4.8  CL 103  CO2 24  GLUCOSE 190*  BUN 23  CREATININE 1.10  CALCIUM  10.1  AST 36  ALT 37  ALKPHOS 66  BILITOT 0.6   ------------------------------------------------------------------------------------------------------------------  Cardiac Enzymes No results for input(s): TROPONINI in the last 168 hours. ------------------------------------------------------------------------------------------------------------------  RADIOLOGY:  CT Angio Chest/Abd/Pel for Dissection W and/or Wo Contrast Result Date: 10/17/2024 EXAM: CTA CHEST, ABDOMEN AND PELVIS WITHOUT AND WITH CONTRAST 10/17/2024 08:48:55 PM TECHNIQUE: CTA of the  chest was performed without and with the administration of 125 mL of intravenous iohexol  (OMNIPAQUE ) 350 MG/ML injection. CTA of the abdomen and pelvis was performed without and with the administration of 125 mL of intravenous iohexol  (OMNIPAQUE ) 350 MG/ML injection. Multiplanar reformatted images are provided for review. MIP images are provided for review. Automated exposure control, iterative reconstruction, and/or weight based adjustment of the mA/kV was utilized to reduce the radiation dose to as low as reasonably achievable. COMPARISON: CT angiogram chest 09/14/2023. CT abdomen and pelvis 08/10/2023. CLINICAL HISTORY: Acute aortic syndrome (AAS) suspected. FINDINGS: VASCULATURE: Coronary and aortic atherosclerotic calcifications are noted. AORTA: No acute finding. No abdominal aortic aneurysm. No dissection.  PULMONARY ARTERIES: No pulmonary embolism with the limits of this exam. GREAT VESSELS OF AORTIC ARCH: No acute finding. No dissection. No arterial occlusion or significant stenosis. 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 No occlusion. SUPERIOR MESENTERIC ARTERY: No acute finding. No occlusion or significant stenosis. INFERIOR MESENTERIC ARTERY: No acute finding. No occlusion or significant stenosis. RENAL ARTERIES: No acute finding. No occlusion or significant stenosis. ILIAC ARTERIES: No acute finding. No occlusion or significant stenosis. CHEST: MEDIASTINUM: Coronary and aortic atherosclerotic calcifications are noted. No mediastinal lymphadenopathy. The heart and pericardium demonstrate no acute abnormality. LUNGS AND PLEURA: There is a 2 mm nodule in the left lower lobe (image 6/101). No focal consolidation or pulmonary edema. No evidence of pleural effusion or pneumothorax. THORACIC BONES AND SOFT TISSUES: There are healed anterior right rib fractures. No acute soft tissue abnormality. ABDOMEN AND PELVIS: LIVER: The liver is unremarkable. GALLBLADDER AND BILE DUCTS: Gallbladder is unremarkable. No biliary ductal dilatation. SPLEEN: The spleen is unremarkable. PANCREAS: The pancreas is unremarkable. ADRENAL GLANDS: Bilateral adrenal glands demonstrate no acute abnormality. KIDNEYS, URETERS AND BLADDER: No stones in the kidneys or ureters. No hydronephrosis. No perinephric or periureteral stranding. Urinary bladder is unremarkable. GI AND BOWEL: Stomach and duodenal sweep demonstrate no acute abnormality. The appendix is not visualized. There is no bowel obstruction. No abnormal bowel wall thickening or distension. REPRODUCTIVE: Reproductive organs are unremarkable. PERITONEUM AND RETROPERITONEUM: No ascites or free air. LYMPH NODES: No lymphadenopathy. ABDOMINAL BONES AND SOFT TISSUES: Right hip arthroplasty present. L5-S1 posterior fusion hardware present. No acute soft tissue  abnormality. IMPRESSION: 1. No evidence of acute aortic syndrome. 2. Coronary and aortic atherosclerosis. 3. 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 4. 2 mm left lower lobe pulmonary nodule with no routine follow-up imaging recommended as per Fleischner Society Guidelines. Electronically signed by: Greig Pique MD 10/17/2024 10:08 PM EST RP Workstation: [id number]   DG Chest Port 1 View Result Date: 10/17/2024 EXAM: 1 VIEW(S) XRAY OF THE CHEST 10/17/2024 07:51:00 PM COMPARISON: 09/14/2023 CLINICAL HISTORY: Chest pain FINDINGS: LUNGS AND PLEURA: No focal pulmonary opacity. No pleural effusion. No pneumothorax. HEART AND MEDIASTINUM: No acute abnormality of the cardiac and mediastinal silhouettes. BONES AND SOFT TISSUES: No acute osseous abnormality. UPPER ABDOMEN: Paucity of bowel gas. IMPRESSION: 1. No acute cardiopulmonary abnormality. Electronically signed by: Greig Pique MD 10/17/2024 09:10 PM EST RP Workstation: [id number]      IMPRESSION AND PLAN:  Assessment and Plan: * Chest pain - The patient will be admitted to an observation cardiac telemetry bed. - The patient has a history of coronary artery disease status post PCI and DES to the LAD. - Will follow serial troponins and EKGs. - The patient will be placed on aspirin  as well as p.r.n. sublingual nitroglycerin  and morphine  sulfate for  pain. - We will obtain a cardiology consult in a.m. for further cardiac risk stratification. - I notified [facility] group about the patient   Dyslipidemia - Continue statin therapy, Lovaza  and Zetia   Type 2 diabetes mellitus with peripheral neuropathy (HCC) - The patient will be placed on supplemental coverage with NovoLog . - Will continue basal coverage and Farxiga . - Will hold off metformin . - Will continue Neurontin .  Essential hypertension - Will continue antihypertensive therapy.  OSA (obstructive sleep apnea) - Will continue CPAP nightly.  GERD without esophagitis - Will continue  PPI therapy and Carafate .   DVT prophylaxis: Lovenox . Advanced Care Planning:  Code Status: full code. Family Communication:  The plan of care was discussed in details with the patient (and family). I answered all questions. The patient agreed to proceed with the above mentioned plan. Further management will depend upon hospital course. Disposition Plan: Back to previous home environment Consults called: Cardiology consult. All the records are reviewed and case discussed with ED provider.  Status is: Observation  I certify that at the time of admission, it is my clinical judgment that the patient will require hospital care extending less than 2 midnights.                            Dispo: The patient is from: Home              Anticipated d/c is to: Home              Patient currently is not medically stable to d/c.              Difficult to place patient: No  Madison DELENA Peaches M.D on 10/18/2024 at 4:49 AM  [facility]   From 7 PM-7 AM, contact night-coverage [email or web address]  CC: Primary care physician; Ziglar, Susan K, MD      [1]  Allergies Allergen Reactions   Bee Pollen Anaphylaxis    Died 3 times when stung by bees. Carries Epi-pen at all times.  bee pollen   Bee Venom Anaphylaxis   Crestor  [Rosuvastatin  Calcium ] Shortness Of Breath and Swelling   Fentanyl  Itching and Hives    blisters Patch   Shellfish Allergy Anaphylaxis and Swelling    Shrimp causes throat to swell and tingling in tongue.  Shellfish (substance)   Chlorhexidine      Other Reaction(s): itching /rash   Fire Ant    Furosemide  Nausea And Vomiting   Buprenorphine Hcl Itching   Chlorhexidine  Gluconate Itching and Rash   Simvastatin Diarrhea   "

## 2024-10-17 NOTE — ED Triage Notes (Signed)
 Patient presents to the ED via EMS from home for complaints of chest pain that began this morning. Reports of taking his prescribed Nitroglycerine at 1300 without relief. Reports of pain in center of chest and radiates to the left. Also complaining of chronic pain in lower back that radiates down both legs.

## 2024-10-17 NOTE — Progress Notes (Signed)
 Anticoagulation monitoring(Lovenox ):  68 yo  male ordered Lovenox  40 mg Q24h    Filed Weights   10/17/24 1937  Weight: 115.2 kg (254 lb)   BMI 38.6    Lab Results  Component Value Date   CREATININE 1.10 10/17/2024   CREATININE 0.96 05/04/2024   CREATININE 0.93 09/21/2023   Estimated Creatinine Clearance: 80.3 mL/min (by C-G formula based on SCr of 1.1 mg/dL). Hemoglobin & Hematocrit     Component Value Date/Time   HGB 11.9 (L) 10/17/2024 1943   HGB 12.3 (L) 05/04/2024 0854   HCT 37.2 (L) 10/17/2024 1943   HCT 40.3 05/04/2024 0854     Per Protocol for Patient with estCrcl > 30 ml/min and BMI > 30, will transition to Lovenox  57.5 mg Q24h.

## 2024-10-17 NOTE — ED Provider Notes (Signed)
 "  [facility] Provider Note    Event Date/Time   First MD Initiated Contact with Patient 10/17/24 1925     (approximate)   History   Chest Pain   HPI  Edward Schneider. is a 68 y.o. male with history of type 2 diabetes, pancreatitis, hypertension, hyperlipidemia and CAD who presents with chest pain, acute onset today.  The patient states that he had some chest pain earlier that resolved.  Over the last hour he has had severe left-sided substernal chest pain radiating to his left arm and down to his left leg.  He reports associated shortness of breath.  He does not feel dizzy or lightheaded.  He has no nausea or vomiting.  It also hurts to touch his chest.  He denies any cough or fever.  I reviewed the past medical records.  The patient's most recent outpatient encounter was with [facility] endocrinology yesterday for follow-up of his diabetes.  His most recent visit with cardiology was on 5/22 for follow-up of CAD.   Physical Exam   Triage Vital Signs: ED Triage Vitals  Encounter Vitals Group     BP 10/17/24 1935 137/82     Girls Systolic BP Percentile --      Girls Diastolic BP Percentile --      Boys Systolic BP Percentile --      Boys Diastolic BP Percentile --      Pulse Rate 10/17/24 1935 72     Resp 10/17/24 1935 16     Temp 10/17/24 1935 97.8 F (36.6 C)     Temp Source 10/17/24 1935 Oral     SpO2 10/17/24 1935 93 %     Weight 10/17/24 1937 254 lb (115.2 kg)     Height 10/17/24 1937 5' 8 (1.727 m)     Head Circumference --      Peak Flow --      Pain Score 10/17/24 1936 7     Pain Loc --      Pain Education --      Exclude from Growth Chart --     Most recent vital signs: Vitals:   10/17/24 1935 10/17/24 2145  BP: 137/82 (!) 133/91  Pulse: 72 70  Resp: 16   Temp: 97.8 F (36.6 C)   SpO2: 93% 96%     General: Alert and oriented, uncomfortable appearing, no distress.  CV:  Good peripheral perfusion.  Normal heart  sounds. Resp:  Increased respiratory effort.  Lungs CTAB. Abd:  No distention.  Other:  No peripheral edema.   ED Results / Procedures / Treatments   Labs (all labs ordered are listed, but only abnormal results are displayed) Labs Reviewed  COMPREHENSIVE METABOLIC PANEL WITH GFR - Abnormal; Notable for the following components:      Result Value   Glucose, Bld 190 (*)    All other components within normal limits  CBC WITH DIFFERENTIAL/PLATELET - Abnormal; Notable for the following components:   Hemoglobin 11.9 (*)    HCT 37.2 (*)    RDW 15.7 (*)    Monocytes Absolute 1.1 (*)    All other components within normal limits  LIPASE, BLOOD - Abnormal; Notable for the following components:   Lipase 55 (*)    All other components within normal limits  PRO BRAIN NATRIURETIC PEPTIDE  TROPONIN T, HIGH SENSITIVITY  TROPONIN T, HIGH SENSITIVITY     EKG  ED ECG REPORT I, Waylon Cassis, the attending physician, personally viewed  and interpreted this ECG.  Date: 10/17/2024 EKG Time: 1932 Rate: 78 Rhythm: normal sinus rhythm QRS Axis: normal Intervals: normal ST/T Wave abnormalities: normal Narrative Interpretation: no evidence of acute ischemia    RADIOLOGY  Chest x-ray: I independently viewed and interpreted the images; there is no focal consolidation or edema  CT angio chest/abdomen/pelvis:  IMPRESSION:  1. No evidence of acute aortic syndrome.  2. Coronary and aortic atherosclerosis.  3. 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  4. 2 mm left lower lobe pulmonary nodule with no routine follow-up imaging  recommended as per Fleischner Society Guidelines.    PROCEDURES:  Critical Care performed: No  Procedures   MEDICATIONS ORDERED IN ED: Medications  morphine  (PF) 4 MG/ML injection 4 mg (4 mg Intravenous Given 10/17/24 1950)  iohexol  (OMNIPAQUE ) 350 MG/ML injection 125 mL (125 mLs Intravenous Contrast Given 10/17/24 2039)  morphine  (PF) 4 MG/ML  injection 4 mg (4 mg Intravenous Given 10/17/24 2147)     IMPRESSION / MDM / ASSESSMENT AND PLAN / ED COURSE  I reviewed the triage vital signs and the nursing notes.  68 year old male with PMH as noted above presents with relatively severe chest pain this evening with an episode of left severe chest pain earlier today.  On exam he appears very uncomfortable, however he is not diaphoretic.  Vital signs are normal.  EKG is nonischemic.  Differential diagnosis includes, but is not limited to, ACS, musculoskeletal chest pain (the chest wall is somewhat tender to exam), GERD, neuropathic pain, aortic dissection, less likely PE given the lack of tachycardia or hypoxia.  We will obtain chest x-ray, cardiac enzymes, and give analgesia.  I have also ordered a CTA dissection study to rule out an acute aortic syndrome.  Patient's presentation is most consistent with acute presentation with potential threat to life or bodily function.  The patient is on the cardiac monitor to evaluate for evidence of arrhythmia and/or significant heart rate changes.  ----------------------------------------- 11:19 PM on 10/17/2024 -----------------------------------------  Chest x-ray is clear.  Troponins are negative x 2.  CMP and CBC show no acute findings.  Lipase is minimally elevated but the patient's presentation is not consistent with a mild pancreatitis.  CTA is negative for aortic dissection or other acute vascular abnormality.  Overall the etiology of the patient's pain is unclear.  It may still be musculoskeletal.  He is still having pain although it is improved.  Given his elevated ACS risk and the persistent pain as well as the severity of the presentation, I feel that he would benefit from inpatient admission for further monitoring.  I consulted Dr. Lawence from the hospitalist service; based on our discussion he agrees to evaluate the patient for admission.   FINAL CLINICAL IMPRESSION(S) / ED DIAGNOSES    Final diagnoses:  Chest pain, unspecified type     Rx / DC Orders   ED Discharge Orders     None        Note:  This document was prepared using Dragon voice recognition software and may include unintentional dictation errors.    Jacolyn Pae, MD 10/17/24 2320  "

## 2024-10-18 ENCOUNTER — Observation Stay (HOSPITAL_BASED_OUTPATIENT_CLINIC_OR_DEPARTMENT_OTHER)

## 2024-10-18 ENCOUNTER — Encounter: Payer: Self-pay | Admitting: Family Medicine

## 2024-10-18 ENCOUNTER — Other Ambulatory Visit: Payer: Self-pay

## 2024-10-18 ENCOUNTER — Observation Stay (HOSPITAL_BASED_OUTPATIENT_CLINIC_OR_DEPARTMENT_OTHER)
Admit: 2024-10-18 | Discharge: 2024-10-18 | Disposition: A | Discharge status: Home / Self Care | Attending: Physician Assistant | Admitting: Physician Assistant

## 2024-10-18 DIAGNOSIS — R079 Chest pain, unspecified: Secondary | ICD-10-CM | POA: Diagnosis not present

## 2024-10-18 DIAGNOSIS — K219 Gastro-esophageal reflux disease without esophagitis: Secondary | ICD-10-CM | POA: Diagnosis not present

## 2024-10-18 DIAGNOSIS — F1120 Opioid dependence, uncomplicated: Secondary | ICD-10-CM

## 2024-10-18 DIAGNOSIS — E1142 Type 2 diabetes mellitus with diabetic polyneuropathy: Secondary | ICD-10-CM

## 2024-10-18 DIAGNOSIS — G4733 Obstructive sleep apnea (adult) (pediatric): Secondary | ICD-10-CM | POA: Diagnosis not present

## 2024-10-18 DIAGNOSIS — I25118 Atherosclerotic heart disease of native coronary artery with other forms of angina pectoris: Secondary | ICD-10-CM

## 2024-10-18 DIAGNOSIS — R52 Pain, unspecified: Secondary | ICD-10-CM

## 2024-10-18 LAB — NM MYOCAR MULTI W/SPECT W/WALL MOTION / EF
LV dias vol: 123 mL (ref 62–150)
LV sys vol: 49 mL
MPHR: 153 {beats}/min
Nuc Stress EF: 60 %
Peak HR: 93 {beats}/min
Percent HR: 60 %
Rest HR: 64 {beats}/min
Rest Nuclear Isotope Dose: 11.2 mCi
SDS: 0
SRS: 3
SSS: 3
ST Depression (mm): 0 mm
Stress Nuclear Isotope Dose: 32.7 mCi
TID: 1.23

## 2024-10-18 LAB — LIPID PANEL
Cholesterol: 123 mg/dL (ref 0–200)
HDL: 44 mg/dL
LDL Cholesterol: 53 mg/dL (ref 0–99)
Total CHOL/HDL Ratio: 2.8 ratio
Triglycerides: 134 mg/dL
VLDL: 27 mg/dL (ref 0–40)

## 2024-10-18 LAB — ECHOCARDIOGRAM COMPLETE
AR max vel: 1.79 cm2
AV Area VTI: 1.6 cm2
AV Area mean vel: 1.73 cm2
AV Mean grad: 4.3 mmHg
AV Peak grad: 7.3 mmHg
Ao pk vel: 1.35 m/s
Area-P 1/2: 2.62 cm2
Height: 68 in
MV VTI: 1.03 cm2
S' Lateral: 3.2 cm
Weight: 4031.77 [oz_av]

## 2024-10-18 LAB — TROPONIN T, HIGH SENSITIVITY
Troponin T High Sensitivity: <15 ng/L (ref 0–19)
Troponin T High Sensitivity: <15 ng/L (ref 0–19)
Troponin T High Sensitivity: <15 ng/L (ref 0–19)
Troponin T High Sensitivity: 16 ng/L (ref 0–19)

## 2024-10-18 LAB — GLUCOSE, CAPILLARY: Glucose-Capillary: 106 mg/dL — ABNORMAL HIGH (ref 70–99)

## 2024-10-18 LAB — HIV ANTIBODY (ROUTINE TESTING W REFLEX): HIV Screen 4th Generation wRfx: NONREACTIVE

## 2024-10-18 LAB — CBG MONITORING, ED: Glucose-Capillary: 158 mg/dL — ABNORMAL HIGH (ref 70–99)

## 2024-10-18 MED ORDER — TECHNETIUM TC 99M TETROFOSMIN IV KIT
32.7000 | PACK | Freq: Once | INTRAVENOUS | Status: AC | PRN
  Administered 2024-10-18: 32.7 via INTRAVENOUS

## 2024-10-18 MED ORDER — OXYCODONE HCL 15 MG PO TABS
15.0000 mg | ORAL_TABLET | ORAL | 0 refills | Status: DC | PRN
  Filled 2024-10-18: qty 30, 5d supply, fill #0

## 2024-10-18 MED ORDER — ENOXAPARIN SODIUM 60 MG/0.6ML IJ SOSY: 55.0000 mg | PREFILLED_SYRINGE | INTRAMUSCULAR | Status: DC

## 2024-10-18 MED ORDER — REGADENOSON 0.4 MG/5ML IV SOLN
0.4000 mg | Freq: Once | INTRAVENOUS | Status: AC
  Administered 2024-10-18: 0.4 mg via INTRAVENOUS

## 2024-10-18 MED ORDER — LANTUS 100 UNIT/ML ~~LOC~~ SOLN: 54.0000 [IU] | Freq: Every day | SUBCUTANEOUS | Status: AC

## 2024-10-18 MED ORDER — TECHNETIUM TC 99M TETROFOSMIN IV KIT
11.1600 | PACK | Freq: Once | INTRAVENOUS | Status: AC | PRN
  Administered 2024-10-18: 11.16 via INTRAVENOUS

## 2024-10-18 MED ORDER — MORPHINE SULFATE (PF) 2 MG/ML IV SOLN
2.0000 mg | INTRAVENOUS | Status: DC | PRN
  Administered 2024-10-18 (×3): 2 mg via INTRAVENOUS
  Filled 2024-10-18 (×3): qty 1

## 2024-10-18 NOTE — Assessment & Plan Note (Signed)
-   Will continue antihypertensive therapy.

## 2024-10-18 NOTE — Assessment & Plan Note (Addendum)
-   The patient will be placed on supplemental coverage with NovoLog . - Will continue basal coverage and Farxiga . - Will hold off metformin . - Will continue Neurontin .

## 2024-10-18 NOTE — Discharge Instructions (Signed)
 Please continue your pain medications sparingly until you can follow up with your doctor who regularly prescribes it.  Your heart work up showed no new concerns.

## 2024-10-18 NOTE — Progress Notes (Incomplete)
 " PROGRESS NOTE Edward Schneider.    DOB: 1956/03/01, 68 y.o.  FMW:[id number]    Code Status: Full Code   DOA: 10/17/2024   LOS: 0  Brief hospital course  Edward Tadros. is a 68 y.o. male with a PMH significant for ***  They presented from *** to the ED on 10/17/2024 with *** x *** days. ***  In the ED, it was found that they had ***.  Significant findings included ***.  They were initially treated with ***.   Patient was admitted to medicine service for further workup and management of *** as outlined in detail below.  10/18/2024 -***  Assessment & Plan  Principal Problem:   Chest pain Active Problems:   Dyslipidemia   Essential hypertension   Type 2 diabetes mellitus with peripheral neuropathy (HCC)   GERD without esophagitis   OSA (obstructive sleep apnea)  *** -   *** -   *** -   *** -   *** -   Body mass index is 38.31 kg/m.  VTE ppx:    Diet:     Diet   Diet NPO time specified   Consultants: ***  Subjective 10/18/2024    Pt reports ***   Objective  Blood pressure (!) 143/78, pulse 65, temperature 98.7 F (37.1 C), resp. rate 18, height 5' 8 (1.727 m), weight 114.3 kg, SpO2 99%.  Intake/Output Summary (Last 24 hours) at 10/18/2024 0744 Last data filed at 10/18/2024 9390 Gross per 24 hour  Intake 19.29 ml  Output 350 ml  Net -330.71 ml   Filed Weights   10/17/24 1937 10/18/24 0152 10/18/24 0522  Weight: 115.2 kg 114.4 kg 114.3 kg     Physical Exam: *** General: awake, alert, NAD HEENT: atraumatic, clear conjunctiva, anicteric sclera, MMM, hearing grossly normal Respiratory: normal respiratory effort. Cardiovascular: extremities well perfused, quick capillary refill, normal S1/S2, RRR, no JVD, murmurs Gastrointestinal: soft, NT, ND Nervous: A&O x3. no gross focal neurologic deficits, normal speech Extremities: moves all equally, no edema, normal tone Skin: dry, intact, normal temperature, normal color. No rashes, lesions or  ulcers on exposed skin Psychiatry: normal mood, congruent affect  Labs   I have personally reviewed the following labs and imaging studies CBC    Component Value Date/Time   WBC 9.2 10/17/2024 1943   RBC 4.45 10/17/2024 1943   HGB 11.9 (L) 10/17/2024 1943   HGB 12.3 (L) 05/04/2024 0854   HCT 37.2 (L) 10/17/2024 1943   HCT 40.3 05/04/2024 0854   PLT 196 10/17/2024 1943   PLT 238 05/04/2024 0854   MCV 83.6 10/17/2024 1943   MCV 85 05/04/2024 0854   MCV 92 02/28/2014 1845   MCH 26.7 10/17/2024 1943   MCHC 32.0 10/17/2024 1943   RDW 15.7 (H) 10/17/2024 1943   RDW 13.7 05/04/2024 0854   RDW 13.6 02/28/2014 1845   LYMPHSABS 2.5 10/17/2024 1943   LYMPHSABS 2.7 05/04/2024 0854   LYMPHSABS 2.6 02/28/2014 1845   MONOABS 1.1 (H) 10/17/2024 1943   MONOABS 0.9 02/28/2014 1845   EOSABS 0.4 10/17/2024 1943   EOSABS 0.7 (H) 05/04/2024 0854   EOSABS 0.1 02/28/2014 1845   BASOSABS 0.1 10/17/2024 1943   BASOSABS 0.1 05/04/2024 0854   BASOSABS 0.0 02/28/2014 1845      Latest Ref Rng & Units 10/17/2024    7:43 PM 05/04/2024    8:54 AM 09/21/2023    3:48 AM  BMP  Glucose 70 - 99 mg/dL 809  777  251   BUN 8 - 23 mg/dL 23  22  23    Creatinine 0.61 - 1.24 mg/dL 8.89  9.03  9.06   BUN/Creat Ratio 10 - 24  23    Sodium 135 - 145 mmol/L 139  138  133   Potassium 3.5 - 5.1 mmol/L 4.8  5.2  4.6   Chloride 98 - 111 mmol/L 103  100  103   CO2 22 - 32 mmol/L 24  23  21    Calcium  8.9 - 10.3 mg/dL 89.8  89.8  8.4     CT Angio Chest/Abd/Pel for Dissection W and/or Wo Contrast Result Date: 10/17/2024 EXAM: CTA CHEST, ABDOMEN AND PELVIS WITHOUT AND WITH CONTRAST 10/17/2024 08:48:55 PM TECHNIQUE: CTA of the chest was performed without and with the administration of 125 mL of intravenous iohexol  (OMNIPAQUE ) 350 MG/ML injection. CTA of the abdomen and pelvis was performed without and with the administration of 125 mL of intravenous iohexol  (OMNIPAQUE ) 350 MG/ML injection. Multiplanar reformatted images  are provided for review. MIP images are provided for review. Automated exposure control, iterative reconstruction, and/or weight based adjustment of the mA/kV was utilized to reduce the radiation dose to as low as reasonably achievable. COMPARISON: CT angiogram chest 09/14/2023. CT abdomen and pelvis 08/10/2023. CLINICAL HISTORY: Acute aortic syndrome (AAS) suspected. FINDINGS: VASCULATURE: Coronary and aortic atherosclerotic calcifications are noted. AORTA: No acute finding. No abdominal aortic aneurysm. No dissection. PULMONARY ARTERIES: No pulmonary embolism with the limits of this exam. GREAT VESSELS OF AORTIC ARCH: No acute finding. No dissection. No arterial occlusion or significant stenosis. 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 No occlusion. SUPERIOR MESENTERIC ARTERY: No acute finding. No occlusion or significant stenosis. INFERIOR MESENTERIC ARTERY: No acute finding. No occlusion or significant stenosis. RENAL ARTERIES: No acute finding. No occlusion or significant stenosis. ILIAC ARTERIES: No acute finding. No occlusion or significant stenosis. CHEST: MEDIASTINUM: Coronary and aortic atherosclerotic calcifications are noted. No mediastinal lymphadenopathy. The heart and pericardium demonstrate no acute abnormality. LUNGS AND PLEURA: There is a 2 mm nodule in the left lower lobe (image 6/101). No focal consolidation or pulmonary edema. No evidence of pleural effusion or pneumothorax. THORACIC BONES AND SOFT TISSUES: There are healed anterior right rib fractures. No acute soft tissue abnormality. ABDOMEN AND PELVIS: LIVER: The liver is unremarkable. GALLBLADDER AND BILE DUCTS: Gallbladder is unremarkable. No biliary ductal dilatation. SPLEEN: The spleen is unremarkable. PANCREAS: The pancreas is unremarkable. ADRENAL GLANDS: Bilateral adrenal glands demonstrate no acute abnormality. KIDNEYS, URETERS AND BLADDER: No stones in the kidneys or ureters. No hydronephrosis. No  perinephric or periureteral stranding. Urinary bladder is unremarkable. GI AND BOWEL: Stomach and duodenal sweep demonstrate no acute abnormality. The appendix is not visualized. There is no bowel obstruction. No abnormal bowel wall thickening or distension. REPRODUCTIVE: Reproductive organs are unremarkable. PERITONEUM AND RETROPERITONEUM: No ascites or free air. LYMPH NODES: No lymphadenopathy. ABDOMINAL BONES AND SOFT TISSUES: Right hip arthroplasty present. L5-S1 posterior fusion hardware present. No acute soft tissue abnormality. IMPRESSION: 1. No evidence of acute aortic syndrome. 2. Coronary and aortic atherosclerosis. 3. 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 4. 2 mm left lower lobe pulmonary nodule with no routine follow-up imaging recommended as per Fleischner Society Guidelines. Electronically signed by: Greig Pique MD 10/17/2024 10:08 PM EST RP Workstation: [id number]   DG Chest Port 1 View Result Date: 10/17/2024 EXAM: 1 VIEW(S) XRAY OF THE CHEST 10/17/2024 07:51:00 PM COMPARISON: 09/14/2023 CLINICAL HISTORY: Chest pain  FINDINGS: LUNGS AND PLEURA: No focal pulmonary opacity. No pleural effusion. No pneumothorax. HEART AND MEDIASTINUM: No acute abnormality of the cardiac and mediastinal silhouettes. BONES AND SOFT TISSUES: No acute osseous abnormality. UPPER ABDOMEN: Paucity of bowel gas. IMPRESSION: 1. No acute cardiopulmonary abnormality. Electronically signed by: Greig Pique MD 10/17/2024 09:10 PM EST RP Workstation: [id number]    Disposition Plan & Communication  Patient status: Observation  Admitted From: {From:23814} Planned disposition location: {PLAN; DISPOSITION:26386} Anticipated discharge date: *** pending ***  Family Communication: ***    Author: Marien LITTIE Piety, DO [facility] 10/18/2024, 7:44 AM   Available by Epic secure chat 7AM-7PM. If 7PM-7AM, please contact night-coverage.  [facility] contact information found on [email or web address].  "

## 2024-10-18 NOTE — Progress Notes (Signed)
 PHARMACIST - PHYSICIAN ORDER COMMUNICATION  CONCERNING: P&T Medication Policy on Herbal Medications  DESCRIPTION:  This patients order for:  Cinnamon  capsules  has been noted.  This product(s) is classified as an herbal or natural product. Due to a lack of definitive safety studies or FDA approval, nonstandard manufacturing practices, plus the potential risk of unknown drug-drug interactions while on inpatient medications, the Pharmacy and Therapeutics Committee does not permit the use of herbal or natural products of this type within [facility].   ACTION TAKEN: The pharmacy department is unable to verify this order at this time and your patient has been informed of this safety policy. Please reevaluate patients clinical condition at discharge and address if the herbal or natural product(s) should be resumed at that time.

## 2024-10-18 NOTE — Consult Note (Signed)
 "  Cardiology Consultation   Patient ID: Edward Schneider. MRN: [id number]; DOB: 09-04-1956  Admit date: 10/17/2024 Date of Consult: 10/18/2024  PCP:  Ziglar, Susan K, MD   [facility] Providers Cardiologist:  Lonni Hanson, MD        Patient Profile: Edward Schneider. is a 68 y.o. male with a hx of coronary artery disease, hypertension, hyperlipidemia, GERD, remote TIA, hepatocellular carcinoma, degenerative disc disease, and asthma who is being seen 10/18/2024 for the evaluation of chest pain at the request of Dr. Lawence.  History of Present Illness:   [name] had prior remote PTCA.  In 08/2019, in the setting of chest pain, he underwent diagnostic catheterization which revealed moderate proximal to mid LAD disease with abnormal FFR and otherwise nonobstructive disease.  He was treated with successful PCI/DES placement to the LAD.  Most recent echo 08/2021 revealed EF 60 to 65% with mild LVH, normal RV function, and aortic valve sclerosis.  He was most recently seen in the cardiology clinic 02/2024 overall doing well from a cardiac perspective without any further testing or medication changes indicated at that time.   Patient reports that yesterday he woke up overall not feeling well. Later in the afternoon, he was doing some light house work when he started to have chest pressure that he describes as an elephant sitting on his chest. He reports that the pain radiated down his left arm. This was associated with diaphoresis. He took a nitroglycerin  without relief of the pain. He took a nap and woke up with the pain worsened. He called EMS for further evaluation. In the ED, vital signs were unremarkable.  Pertinent labs include sodium 139, potassium 4.8, BUN 23, creatinine 1.1.  proBNP undetectable.  Troponin negative x 5.  EKG without acute ischemic changes.  Chest x-ray without acute cardiopulmonary abnormality.  CT angiogram of the chest negative for acute aortic syndrome with  coronary and aortic atherosclerosis noted.  Cardiology was asked to consult for further evaluation of chest pain. At time of cardiology consult, patient reports ongoing symptoms of chest pressure. His left chest wall is tender to palpation. He denies shortness of breath, lower extremity swelling, lightheadedness, palpitations, and lower extremity swelling. Patient has chronic pain and recently ran out of his pain medicine this past Sunday. He reports that he has been feeling like he is withdrawing.    Past Medical History:  Diagnosis Date   Allergy    Aortic atherosclerosis    Aortic valve sclerosis    a. 08/2021 Echo: EF 60-65%, no rwma, mild LVH, nl RV fxn, mildly dil RA, AoV sclerosis w/o stenosis.   Asthma    C. difficile diarrhea    Chronic pain    Collagenous colitis    Coronary artery disease    a. 08/2019 Cath/PCI: LM 60p/m (FFR 0.87-->2.75 x 18 Resolute Onyx DES). D1/2/3 nl, RI nl, LCX 20p/m, RCA/RPDA nl, RPAV 50. EF 55-65%.   DDD (degenerative disc disease), cervical    DDD (degenerative disc disease), lumbar    GERD (gastroesophageal reflux disease)    Grade I diastolic dysfunction    Hepatic steatosis    Hyperlipidemia    Hypertension    Liver cancer (HCC) 03/2015   Migraines    Myocardial infarction (HCC)     OSA on CPAP    Seizures (HCC)    several as child when sick.  None since age 61   Stroke (HCC)    'mini-stroke 30 yrs ago.  no deficits.   T2DM (type 2 diabetes mellitus) (HCC)    Wears dentures    full upper and lower    Past Surgical History:  Procedure Laterality Date   APPENDECTOMY     BACK SURGERY     CARDIAC CATHETERIZATION     No stent placed in his 52's   CATARACT EXTRACTION W/PHACO Left 09/27/2024   Procedure: PHACOEMULSIFICATION, CATARACT, WITH IOL INSERTION 4.76 00:36.4;  Surgeon: Mittie Gaskin, MD;  Location: [facility];  Service: Ophthalmology;  Laterality: Left;   CERVICAL FUSION     COLONOSCOPY WITH PROPOFOL  N/A 03/06/2016    Procedure: COLONOSCOPY WITH PROPOFOL ;  Surgeon: Rogelia Copping, MD;  Location: [facility];  Service: Endoscopy;  Laterality: N/A;  requests early   COLONOSCOPY WITH PROPOFOL  N/A 06/18/2020   Procedure: COLONOSCOPY WITH PROPOFOL ;  Surgeon: Copping Rogelia, MD;  Location: [facility];  Service: Endoscopy;  Laterality: N/A;   CORONARY PRESSURE/FFR STUDY N/A 09/05/2019   Procedure: INTRAVASCULAR PRESSURE WIRE/FFR STUDY;  Surgeon: Mady Bruckner, MD;  Location: [facility];  Service: Cardiovascular;  Laterality: N/A;   ESOPHAGOGASTRODUODENOSCOPY (EGD) WITH PROPOFOL  N/A 09/20/2017   Procedure: ESOPHAGOGASTRODUODENOSCOPY (EGD) WITH PROPOFOL ;  Surgeon: Copping Rogelia, MD;  Location: [facility];  Service: Endoscopy;  Laterality: N/A;  Diabetic - oral meds   FINGER SURGERY Left    KNEE SURGERY Right    LEFT HEART CATH AND CORONARY ANGIOGRAPHY Left 09/05/2019   Procedure: LEFT HEART CATH AND CORONARY ANGIOGRAPHY (2.75 x 18 mm Resolute Onyx DES x 1 to prox/mid LAD);  Surgeon: Mady Bruckner, MD;  Location: [facility];  Service: Cardiovascular;  Laterality: Left;   NECK SURGERY     spleen surgery     TOE SURGERY Right    TOTAL HIP ARTHROPLASTY Right 06/03/2021   Procedure: TOTAL HIP ARTHROPLASTY ANTERIOR APPROACH;  Surgeon: Kathlynn Sharper, MD;  Location: [facility];  Service: Orthopedics;  Laterality: Right;   TOTAL KNEE ARTHROPLASTY Right 08/22/2021   Procedure: TOTAL KNEE ARTHROPLASTY;  Surgeon: Kathlynn Sharper, MD;  Location: [facility];  Service: Orthopedics;  Laterality: Right;       Scheduled Meds:  vitamin C   1,000 mg Oral Daily   aspirin  EC  81 mg Oral Daily   atorvastatin   80 mg Oral Daily   benazepril   10 mg Oral Daily   bisacodyl   10 mg Oral Q1500   calcium  carbonate  600 mg of elemental calcium  Oral Daily   cholecalciferol   1,000 Units Oral Daily   cyanocobalamin   1,000 mcg Oral Daily   dapagliflozin  propanediol  10 mg Oral QAC breakfast   doxepin   50 mg  Oral QHS   DULoxetine   60 mg Oral Daily   enoxaparin  (LOVENOX ) injection  0.5 mg/kg Subcutaneous Q24H   ezetimibe   10 mg Oral Daily   famotidine   20 mg Oral BID   gabapentin   300 mg Oral TID   insulin  glargine  54 Units Subcutaneous QHS   isosorbide  mononitrate  60 mg Oral Daily   loratadine   10 mg Oral Daily   multivitamin with minerals  1 tablet Oral Daily   omega-3 acid ethyl esters  1,000 mg Oral Daily   pantoprazole   40 mg Oral BID   polyethylene glycol  17 g Oral BID   senna-docusate  1 tablet Oral BID   vitamin E   400 Units Oral Daily   Continuous Infusions:  sodium chloride  100 mL/hr at 10/18/24 0337   PRN Meds: acetaminophen , ALPRAZolam, cyclobenzaprine , diclofenac  Sodium,  lidocaine , magnesium  hydroxide, morphine  injection, nitroGLYCERIN , ondansetron  (ZOFRAN ) IV, oxyCODONE , traZODone   Allergies:   Allergies[1]  Social History:   Social History   Socioeconomic History   Marital status: Married    Spouse name: Not on file   Number of children: Not on file   Years of education: 13.5   Highest education level: Some college, no degree  Occupational History   Occupation: disability   Tobacco Use   Smoking status: Former    Current packs/day: 0.00    Average packs/day: 2.0 packs/day for 50.0 years (100.0 ttl pk-yrs)    Types: Cigarettes    Start date: 68    Quit date: 2011    Years since quitting: 15.0    Passive exposure: Past   Smokeless tobacco: Never  Vaping Use   Vaping status: Never Used  Substance and Sexual Activity   Alcohol use: No    Alcohol/week: 0.0 standard drinks of alcohol   Drug use: Yes    Comment: prescribed narcotics   Sexual activity: Not Currently  Other Topics Concern   Not on file  Social History Narrative   Right handed    Lives with wife   Social Drivers of Health   Tobacco Use: Medium Risk (10/18/2024)   Patient History    Smoking Tobacco Use: Former    Smokeless Tobacco Use: Never    Passive Exposure: Past  Engineer, Maintenance Strain: Low Risk (07/08/2022)   Overall Financial Resource Strain (CARDIA)    Difficulty of Paying Living Expenses: Not hard at all  Food Insecurity: No Food Insecurity (10/18/2024)   Epic    Worried About Programme Researcher, Broadcasting/film/video in the Last Year: Never true    Ran Out of Food in the Last Year: Never true  Transportation Needs: No Transportation Needs (10/18/2024)   Epic    Lack of Transportation (Medical): No    Lack of Transportation (Non-Medical): No  Physical Activity: Inactive (08/29/2024)   Exercise Vital Sign    Days of Exercise per Week: 0 days    Minutes of Exercise per Session: Patient declined  Stress: No Stress Concern Present (08/29/2024)   [employer] of Occupational Health - Occupational Stress Questionnaire    Feeling of Stress: Not at all  Social Connections: Moderately Integrated (10/18/2024)   Social Connection and Isolation Panel    Frequency of Communication with Friends and Family: More than three times a week    Frequency of Social Gatherings with Friends and Family: Never    Attends Religious Services: More than 4 times per year    Active Member of [employer] or Organizations: No    Attends Banker Meetings: Never    Marital Status: Married  Recent Concern: Social Connections - Moderately Isolated (08/29/2024)   Social Connection and Isolation Panel    Frequency of Communication with Friends and Family: Once a week    Frequency of Social Gatherings with Friends and Family: Never    Attends Religious Services: More than 4 times per year    Active Member of [employer] or Organizations: No    Attends Banker Meetings: Never    Marital Status: Married  Catering Manager Violence: Not At Risk (10/18/2024)   Epic    Fear of Current or Ex-Partner: No    Emotionally Abused: No    Physically Abused: No    Sexually Abused: No  Depression (PHQ2-9): Medium Risk (09/04/2024)   Depression (PHQ2-9)    PHQ-2 Score: 7  Alcohol Screen: Low  Risk (07/08/2022)   Alcohol Screen    Last Alcohol Screening Score (AUDIT): 0  Housing: Low Risk (10/18/2024)   Epic    Unable to Pay for Housing in the Last Year: No    Number of Times Moved in the Last Year: 0    Homeless in the Last Year: No  Utilities: Not At Risk (10/18/2024)   Epic    Threatened with loss of utilities: No  Health Literacy: Adequate Health Literacy (08/29/2024)   B1300 Health Literacy    Frequency of need for help with medical instructions: Never    Family History:    Family History  Problem Relation Age of Onset   Arthritis Mother    Diabetes Mother    Kidney disease Mother    Heart disease Mother    Hypertension Mother    Arthritis Father    Hearing loss Father    Hypertension Father    Heart disease Father    Diabetes Sister    Heart disease Sister    Diabetes Daughter    Diabetes Maternal Aunt    Diabetes Maternal Grandmother    Heart Problems Brother    Heart Problems Brother    Heart Problems Brother    Heart attack Maternal Grandfather    Colon cancer Paternal Grandfather      ROS:  Please see the history of present illness.   Physical Exam/Data: Vitals:   10/18/24 0116 10/18/24 0152 10/18/24 0336 10/18/24 0522  BP: 132/61 135/83 (!) 143/78   Pulse: 73 70 65   Resp: 14 18 18    Temp: 97.7 F (36.5 C) 97.8 F (36.6 C) 98.7 F (37.1 C)   TempSrc: Oral     SpO2: 95% 99% 99%   Weight:  114.4 kg  114.3 kg  Height:  5' 8 (1.727 m)      Intake/Output Summary (Last 24 hours) at 10/18/2024 0739 Last data filed at 10/18/2024 9390 Gross per 24 hour  Intake 19.29 ml  Output 350 ml  Net -330.71 ml      10/18/2024    5:22 AM 10/18/2024    1:52 AM 10/17/2024    7:37 PM  Last 3 Weights  Weight (lbs) 251 lb 15.8 oz 252 lb 3.3 oz 254 lb  Weight (kg) 114.3 kg 114.4 kg 115.214 kg     Body mass index is 38.31 kg/m.  General:  Well nourished, well developed, in no acute distress HEENT: normal Neck: no JVD Vascular: No carotid  bruits; Distal pulses 2+ bilaterally Cardiac:  normal S1, S2; RRR; no murmur  Lungs:  clear to auscultation bilaterally, no wheezing, rhonchi or rales  Abd: soft, nontender, no hepatomegaly  Ext: no edema Skin: warm and dry  Psych:  Normal affect   EKG:  The EKG was personally reviewed and demonstrates:  Normal sinus rhythm with first degree AV block, rate 78 bpm Telemetry:  Telemetry was personally reviewed and demonstrates:  Sinus rhythm  Relevant CV Studies:  08/2021 Echo complete 1. Left ventricular ejection fraction, by estimation, is 60 to 65%. The  left ventricle has normal function. The left ventricle has no regional  wall motion abnormalities. There is mild left ventricular hypertrophy.  Indeterminate diastolic filling due to  E-A fusion.   2. Right ventricular systolic function is normal. The right ventricular  size is normal. Mildly increased right ventricular wall thickness.  Tricuspid regurgitation signal is inadequate for assessing PA pressure.   3. Right atrial size was mildly dilated.   4.  The mitral valve is abnormal. No evidence of mitral valve  regurgitation. No evidence of mitral stenosis.   5. The aortic valve has an indeterminant number of cusps. There is mild  calcification of the aortic valve. There is mild thickening of the aortic  valve. Aortic valve regurgitation is not visualized. Mild aortic valve  sclerosis is present, with no  evidence of aortic valve stenosis.   Laboratory Data: High Sensitivity Troponin:  No results for input(s): TROPONINIHS in the last 720 hours.  Recent Labs  Lab 10/17/24 1943 10/17/24 2147 10/18/24 0235 10/18/24 0357 10/18/24 0620  TRNPT <15 <15 <15 <15 <15      Chemistry Recent Labs  Lab 10/17/24 1943  NA 139  K 4.8  CL 103  CO2 24  GLUCOSE 190*  BUN 23  CREATININE 1.10  CALCIUM  10.1  GFRNONAA >60  ANIONGAP 13    Recent Labs  Lab 10/17/24 1943  PROT 7.2  ALBUMIN 4.4  AST 36  ALT 37  ALKPHOS 66   BILITOT 0.6   Lipids  Recent Labs  Lab 10/18/24 0357  CHOL 123  TRIG 134  HDL 44  LDLCALC 53  CHOLHDL 2.8    Hematology Recent Labs  Lab 10/17/24 1943  WBC 9.2  RBC 4.45  HGB 11.9*  HCT 37.2*  MCV 83.6  MCH 26.7  MCHC 32.0  RDW 15.7*  PLT 196   Thyroid  No results for input(s): TSH, FREET4 in the last 168 hours.  BNP Recent Labs  Lab 10/17/24 1943  PROBNP <50.0    DDimer No results for input(s): DDIMER in the last 168 hours.  Radiology/Studies:  CT Angio Chest/Abd/Pel for Dissection W and/or Wo Contrast Result Date: 10/17/2024 IMPRESSION: 1. No evidence of acute aortic syndrome. 2. Coronary and aortic atherosclerosis. 3. 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 4. 2 mm left lower lobe pulmonary nodule with no routine follow-up imaging recommended as per Fleischner Society Guidelines. Electronically signed by: Greig Pique MD 10/17/2024 10:08 PM EST RP Workstation: [id number]   DG Chest Port 1 View Result Date: 10/17/2024 IMPRESSION: 1. No acute cardiopulmonary abnormality. Electronically signed by: Greig Pique MD 10/17/2024 09:10 PM EST RP Workstation: [id number]    Assessment and Plan:  Chest pain Coronary artery disease - Presenting with significant chest pressure radiating down the left arm, unrelieved by nitroglycerin . Also a component of tenderness with palpation of the chest wall.  - Troponin negative x 5 - EKG without acute ischemic changes - It is possible that his pain is atypical in the setting of chronic pain and being out of his pain medications. However, given his persistent symptoms and history of CAD, would recommend ischemic evaluation with Lexiscan  Myoview . Scheduled for this afternoon, he should remain NPO until then.  - Has historically not tolerated beta blockers due to bradycardia - Continue aspirin  81 mg daily, atorvastatin  80 mg daily, and Imdur  60 mg daily  Hypertension - BP well controlled on current  regimen  Hyperlipidemia - LDL 53 this admission, at goal  Chronic pain - Could be contributing to symptoms as he has been out of his home regimen - Ongoing management per IM   Informed Consent   Shared Decision Making/Informed Consent The risks [chest pain, shortness of breath, cardiac arrhythmias, dizziness, blood pressure fluctuations, myocardial infarction, stroke/transient ischemic attack, nausea, vomiting, allergic reaction, radiation exposure, metallic taste sensation and life-threatening complications (estimated to be 1 in 10,000)], benefits (risk stratification, diagnosing coronary artery disease, treatment guidance) and alternatives  of a nuclear stress test were discussed in detail with Mr. Hellmer and he agrees to proceed.      For questions or updates, please contact [facility] Please consult [email or web address] for contact info under      Signed, Lesley LITTIE Maffucci, PA-C  10/18/2024 7:39 AM     [1]  Allergies Allergen Reactions   Bee Pollen Anaphylaxis    Died 3 times when stung by bees. Carries Epi-pen at all times.  bee pollen   Bee Venom Anaphylaxis   Crestor  [Rosuvastatin  Calcium ] Shortness Of Breath and Swelling   Fentanyl  Itching and Hives    blisters Patch   Shellfish Allergy Anaphylaxis and Swelling    Shrimp causes throat to swell and tingling in tongue.  Shellfish (substance)   Chlorhexidine      Other Reaction(s): itching /rash   Fire Ant    Furosemide  Nausea And Vomiting   Buprenorphine Hcl Itching   Chlorhexidine  Gluconate Itching and Rash   Simvastatin Diarrhea   "

## 2024-10-18 NOTE — Progress Notes (Signed)
*  PRELIMINARY RESULTS* Echocardiogram 2D Echocardiogram has been performed.  Edward Schneider 10/18/2024, 10:52 AM

## 2024-10-18 NOTE — Assessment & Plan Note (Signed)
-   Continue statin therapy, Lovaza  and Zetia

## 2024-10-18 NOTE — Discharge Summary (Signed)
 " Physician Discharge Summary  Patient: Edward Schneider. FMW:[id number] DOB: 11-Jan-1956   Code Status: Full Code Admit date: 10/17/2024 Discharge date: 10/18/2024 Disposition: Home, No home health services recommended PCP: Ziglar, Devere POUR, MD  Recommendations for Outpatient Follow-up:  Follow up with PCP within 1-2 weeks Regarding general hospital follow up and preventative care Follow up with pain clinic Follow up with cardiology   Discharge Diagnoses:  Principal Problem:   Chest pain Active Problems:   Dyslipidemia   Essential hypertension   Type 2 diabetes mellitus with peripheral neuropathy (HCC)   DM type 2 with diabetic peripheral neuropathy (HCC)   GERD without esophagitis   OSA (obstructive sleep apnea)   Complaints of total body pain  Brief Hospital Course Summary: Edward Marsico. is a 68 y.o. male with medical history significant for asthma, chronic pain, collagenous colitis, coronary artery disease GERD, DDD, hypertension, dyslipidemia, migraine and seizures, Type 2 diabetes mellitus. They presented to the emergency room with acute onset of midsternal chest pain.  ED Course: BP was 133/91 with otherwise normal vital signs.  Labs revealed a blood glucose of 190 and lipase of 55 with otherwise unremarkable CMP.  proBNP was less than 50 and high sensitive troponin T was less than 15 twice. hemoglobin 11.9 EKG: normal sinus rhythm with a rate of 78 with prolonged PR interval. Imaging: 2 view chest x-ray showed no acute cardiopulmonary disease.  CT of the chest, abdomen and pelvis with and without contrast revealed no evidence for acute aortic syndrome.  It showed coronary and aortic atherosclerosis.  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   The patient was given 4 mg of IV morphine  sulfate twice.  Cardiology was consulted. He underwent ischemic workup including echo and  nuclear stress test which were both mostly unremarkable. Acute ACS was ruled out.   He revealed that after a long period of taking high doses of opioids, he had ran out for several days and unable to get refill due to his provider being out of office. His symptoms were considered to be possible opioid withdrawal.  He was started back on a short course of his home opioids until he can follow up with his PCP.  He remained hemodynamically stable and his presenting symptoms resolved.   All other chronic conditions were treated with home medications.    Discharge Condition: Good, improved Recommended discharge diet: Regular healthy diet  Consultations: Cardiology   Procedures/Studies: TTE Lexiscan  myoview   Allergies as of 10/18/2024       Reactions   Bee Pollen Anaphylaxis   Died 3 times when stung by bees. Carries Epi-pen at all times. bee pollen   Bee Venom Anaphylaxis   Crestor  [rosuvastatin  Calcium ] Shortness Of Breath, Swelling   Fentanyl  Itching, Hives   blisters Patch   Shellfish Allergy Anaphylaxis, Swelling   Shrimp causes throat to swell and tingling in tongue. Shellfish (substance)   Chlorhexidine     Other Reaction(s): itching /rash   Fire Ant    Furosemide  Nausea And Vomiting   Buprenorphine Hcl Itching   Chlorhexidine  Gluconate Itching, Rash   Simvastatin Diarrhea        Medication List     STOP taking these medications    diphenhydrAMINE  25 mg capsule Commonly known as: BENADRYL    HumaLOG  KwikPen 100 UNIT/ML KwikPen Generic drug: insulin  lispro   senna-docusate 8.6-50 MG tablet Commonly known as:  Senokot-S       TAKE these medications    Accu-Chek Guide test strip Generic drug: glucose blood Check blood sugar 2 times daily   Accu-Chek Softclix Lancets lancets Check blood sugar 2 times daily   acetaminophen  650 MG CR tablet Commonly known as: TYLENOL  Take 1,300 mg by mouth every 8 (eight) hours.   Tylenol  8 Hour 650 MG CR  tablet Generic drug: acetaminophen  2 tablets as needed Orally Twice a day For Pain   albuterol  108 (90 Base) MCG/ACT inhaler Commonly known as: VENTOLIN  HFA Inhale 2 puffs into the lungs every 6 (six) hours as needed for wheezing or shortness of breath.   aspirin  EC 81 MG tablet Take 81 mg by mouth daily. Swallow whole.   atorvastatin  80 MG tablet Commonly known as: LIPITOR  TAKE 1 TABLET(80 MG) BY MOUTH AT BEDTIME   Beet Root 500 MG Caps 1 tablet Orally twice a day Daily supplement/general wellness   benazepril  10 MG tablet Commonly known as: LOTENSIN  Take 1 tablet (10 mg total) by mouth daily.   bisacodyl  5 MG EC tablet Commonly known as: Dulcolax Take 2 tablets (10 mg total) by mouth daily in the afternoon.   Breztri  Aerosphere 160-9-4.8 MCG/ACT Aero inhaler Generic drug: budesonide -glycopyrrolate -formoterol  Inhale 2 puffs into the lungs in the morning and at bedtime.   calcium  carbonate 600 MG Tabs tablet Commonly known as: OS-CAL Take 600 mg by mouth daily.   Centrum Silver 50+Men Tabs Take 1 tablet by mouth daily.   cetirizine  10 MG tablet Commonly known as: ZYRTEC  Take 1 tablet (10 mg total) by mouth daily.   Cinnamon  Plus Chromium [id number] MCG-MG Caps Generic drug: Chromium-Cinnamon  1 tablet Orally twice a day Daily supplement/metabolism support   CINNAMON  PO Take 1,000 mg by mouth 2 (two) times daily.   clobetasol  cream 0.05 % Commonly known as: TEMOVATE  Apply 1 application topically 2 (two) times daily.   Comfort EZ Pen Needles 32G X 4 MM Misc Generic drug: Insulin  Pen Needle Use to inject insulin  5 times daily   cyanocobalamin  1000 MCG tablet Commonly known as: VITAMIN B12 Take 1,000 mcg by mouth daily.   cyclobenzaprine  10 MG tablet Commonly known as: FLEXERIL  Take 1 tablet (10 mg total) by mouth 3 (three) times daily as needed for muscle spasms.   dapagliflozin  propanediol 10 MG Tabs tablet Commonly known as: Farxiga  Take 1 tablet (10  mg total) by mouth daily before breakfast.   diclofenac  Sodium 1 % Gel Commonly known as: VOLTAREN  Apply 2 g topically 4 (four) times daily as needed (pain).   doxepin  25 MG capsule Commonly known as: SINEQUAN  Take 2 capsules (50 mg total) by mouth at bedtime.   DULoxetine  60 MG capsule Commonly known as: CYMBALTA  TAKE 1 CAPSULE(60 MG) BY MOUTH AT BEDTIME   EPINEPHrine  0.3 mg/0.3 mL Soaj injection Commonly known as: EPI-PEN Inject 0.3 mg into the muscle as needed for anaphylaxis.   Excedrin  Tension Headache 500-65 MG Tabs per tablet Generic drug: acetaminophen -caffeine  Take 1 tablet by mouth as needed.   ezetimibe  10 MG tablet Commonly known as: ZETIA  Take 1 tablet (10 mg total) by mouth daily.   famotidine  20 MG tablet Commonly known as: PEPCID  Take 1 tablet (20 mg total) by mouth 2 (two) times daily.   Flaxseed Oil 1200 MG Caps Take 1 capsule by mouth daily.   fluticasone 50 MCG/ACT nasal spray Commonly known as: FLONASE Place into both nostrils.   FreeStyle Libre 3 Plus Sensor Misc Change sensor every  15 days.   gabapentin  300 MG capsule Commonly known as: NEURONTIN  Take 300 mg by mouth 3 (three) times daily.   Ginkgo Biloba 60 MG Tabs Take 1 tablet by mouth daily.   Ginseng 100 MG Caps Take 100 mg by mouth daily.   hydrocortisone  25 MG suppository Commonly known as: ANUSOL -HC Place 1 suppository (25 mg total) rectally 2 (two) times daily as needed for hemorrhoids.   isosorbide  mononitrate 60 MG 24 hr tablet Commonly known as: IMDUR  Take 1 tablet (60 mg total) by mouth daily.   Lantus  100 UNIT/ML injection Generic drug: insulin  glargine Inject 0.54 mLs (54 Units total) into the skin daily. What changed: when to take this   lidocaine  2 % solution Commonly known as: XYLOCAINE  Use as directed 15 mLs in the mouth or throat as needed for mouth pain.   metFORMIN  500 MG 24 hr tablet Commonly known as: GLUCOPHAGE -XR Take 2 tablets (1,000 mg total) by  mouth daily with breakfast.   Minoxidil 5 % Foam Apply 1 g topically daily.   mometasone  0.1 % ointment Commonly known as: ELOCON  Apply topically 2 (two) times daily as needed.   mupirocin  ointment 2 % Commonly known as: BACTROBAN  Apply 1 Application topically 2 (two) times daily.   Narcan  4 MG/0.1ML Liqd nasal spray kit Generic drug: naloxone  Place 0.4 mg into the nose as needed (opioid overdose). Never uses   nitroGLYCERIN  0.4 MG SL tablet Commonly known as: NITROSTAT  Place 1 tablet (0.4 mg total) under the tongue every 5 (five) minutes as needed for chest pain.   Omega-3 1000 MG Caps Take 1,000 mg by mouth daily.   ondansetron  4 MG tablet Commonly known as: ZOFRAN  TAKE 1 TABLET(4 MG) BY MOUTH EVERY 8 HOURS AS NEEDED FOR NAUSEA OR VOMITING   oxyCODONE  15 MG immediate release tablet Commonly known as: ROXICODONE  Take 1 tablet (15 mg total) by mouth every 4 (four) hours as needed for up to 5 days for severe pain (pain score 7-10). What changed:  reasons to take this additional instructions Another medication with the same name was removed. Continue taking this medication, and follow the directions you see here.   pantoprazole  40 MG tablet Commonly known as: PROTONIX  TAKE 1 TABLET(40 MG) BY MOUTH TWICE DAILY   Pharmacist Choice Alcohol Pads SMARTSIG:1 Each Topical 4 Times Daily   polyethylene glycol 17 g packet Commonly known as: MIRALAX  / GLYCOLAX  Take 17 g by mouth 2 (two) times daily.   sucralfate  1 g tablet Commonly known as: CARAFATE  TAKE 1 TABLET BY MOUTH FOUR TIMES DAILY WITH MEALS AND AT BEDTIME   traZODone  100 MG tablet Commonly known as: DESYREL  Take 1 tablet (100 mg total) by mouth at bedtime as needed. for sleep   triamcinolone  0.025 % cream Commonly known as: KENALOG  Apply 1 Application topically 2 (two) times daily.   vitamin A  10000 UNIT capsule Take 10,000 Units by mouth daily.   vitamin C  1000 MG tablet Take 1,000 mg by mouth daily.    Vitamin D3 25 MCG (1000 UT) Chew Chew 1,000 Units by mouth daily.   Vitamin E  400 units Tabs Take 400 Units by mouth daily.        Follow-up Information     Ziglar, Susan K, MD. Schedule an appointment as soon as possible for a visit in 1 week(s).   Specialty: Family Medicine Contact information: [address] [place] [address] [phone]  Subjective   Pt reports feeling improved. Currently no chest pain, diaphoresis, nausea, vomiting. Ambulating without symptoms.   All questions and concerns were addressed at time of discharge.  Objective  Blood pressure 137/87, pulse 68, temperature 97.7 F (36.5 C), resp. rate 17, height 5' 8 (1.727 m), weight 114.3 kg, SpO2 97%.   General: Pt is alert, awake, not in acute distress Cardiovascular: RRR, S1/S2 +, no rubs, no gallops Respiratory: CTA bilaterally, no wheezing, no rhonchi Abdominal: Soft, NT, ND, bowel sounds + Extremities: no edema, no cyanosis  The results of significant diagnostics from this hospitalization (including imaging, microbiology, ancillary and laboratory) are listed below for reference.   Imaging studies: NM Myocar Multi W/Spect W/Wall Motion / EF Result Date: 10/18/2024 Pharmacological myocardial perfusion imaging study with no significant  ischemia Normal wall motion, EF estimated at 60% No EKG changes concerning for ischemia at peak stress or in recovery. CT attenuation correction images detail coronary calcification and stent in the proximal LAD Low risk scan Signed, Velinda Lunger, MD, Ph.D [facility]   ECHOCARDIOGRAM COMPLETE Result Date: 10/18/2024    ECHOCARDIOGRAM REPORT   Patient Name:   Edward Schneider. Date of Exam: 10/18/2024 Medical Rec #:  [id number]         Height:       68.0 in Accession #:    [id number]        Weight:       252.0 lb Date of Birth:  [birth year]         BSA:          2.255 m Patient Age:    68 years          BP:           122/77 mmHg Patient Gender:  M                 HR:           62 bpm. Exam Location:  [facility] Procedure: 2D Echo, Cardiac Doppler, Color Doppler, Strain Analysis and 3D Echo            (Both Spectral and Color Flow Doppler were utilized during            procedure). Indications:     Chest pain R07.9  History:         Patient has prior history of Echocardiogram examinations, most                  recent 08/26/2021. Previous Myocardial Infarction; Risk                  Factors:Dyslipidemia, Hypertension, Diabetes and Sleep Apnea.  Sonographer:     Christopher Furnace Referring Phys:  [id number] [facility] CAREY Diagnosing Phys: Evalene Lunger MD  Sonographer Comments: Global longitudinal strain was attempted. IMPRESSIONS  1. Left ventricular ejection fraction, by estimation, is 55 to 60%. The left ventricle has normal function. The left ventricle has no regional wall motion abnormalities. Left ventricular diastolic parameters were normal. The average left ventricular global longitudinal strain is -17.8 %. The global longitudinal strain is normal.  2. Right ventricular systolic function is normal. The right ventricular size is normal. There is normal pulmonary artery systolic pressure. The estimated right ventricular systolic pressure is 13.3 mmHg.  3. The mitral valve is normal in structure. No evidence of mitral valve regurgitation. No evidence of mitral stenosis.  4. The aortic valve is normal in structure. Aortic valve regurgitation is not visualized.  No aortic stenosis is present.  5. The inferior vena cava is normal in size with greater than 50% respiratory variability, suggesting right atrial pressure of 3 mmHg. FINDINGS  Left Ventricle: Left ventricular ejection fraction, by estimation, is 55 to 60%. The left ventricle has normal function. The left ventricle has no regional wall motion abnormalities. The average left ventricular global longitudinal strain is -17.8 %. Strain was performed and the global longitudinal strain is normal. The left ventricular  internal cavity size was normal in size. There is no left ventricular hypertrophy. Left ventricular diastolic parameters were normal. Right Ventricle: The right ventricular size is normal. No increase in right ventricular wall thickness. Right ventricular systolic function is normal. There is normal pulmonary artery systolic pressure. The tricuspid regurgitant velocity is 1.44 m/s, and  with an assumed right atrial pressure of 5 mmHg, the estimated right ventricular systolic pressure is 13.3 mmHg. Left Atrium: Left atrial size was normal in size. Right Atrium: Right atrial size was normal in size. Pericardium: There is no evidence of pericardial effusion. Mitral Valve: The mitral valve is normal in structure. No evidence of mitral valve regurgitation. No evidence of mitral valve stenosis. MV peak gradient, 7.0 mmHg. The mean mitral valve gradient is 3.0 mmHg. Tricuspid Valve: The tricuspid valve is normal in structure. Tricuspid valve regurgitation is not demonstrated. No evidence of tricuspid stenosis. Aortic Valve: The aortic valve is normal in structure. Aortic valve regurgitation is not visualized. No aortic stenosis is present. Aortic valve mean gradient measures 4.3 mmHg. Aortic valve peak gradient measures 7.3 mmHg. Aortic valve area, by VTI measures 1.60 cm. Pulmonic Valve: The pulmonic valve was normal in structure. Pulmonic valve regurgitation is not visualized. No evidence of pulmonic stenosis. Aorta: The aortic root is normal in size and structure. Venous: The inferior vena cava is normal in size with greater than 50% respiratory variability, suggesting right atrial pressure of 3 mmHg. IAS/Shunts: No atrial level shunt detected by color flow Doppler. Additional Comments: 3D was performed not requiring image post processing on an independent workstation and was indeterminate.  LEFT VENTRICLE PLAX 2D LVIDd:         4.40 cm   Diastology LVIDs:         3.20 cm   LV e' medial:    13.10 cm/s LV PW:          1.30 cm   LV E/e' medial:  7.9 LV IVS:        1.10 cm   LV e' lateral:   13.40 cm/s LVOT diam:     2.00 cm   LV E/e' lateral: 7.8 LV SV:         47 LV SV Index:   21        2D Longitudinal Strain LVOT Area:     3.14 cm  2D Strain GLS Avg:     -17.8 % LV IVRT:       111 msec                           3D Volume EF:                          3D EF:        53 %                          LV EDV:  283 ml                          LV ESV:       133 ml                          LV SV:        151 ml RIGHT VENTRICLE RV Basal diam:  4.10 cm     PULMONARY VEINS RV Mid diam:    3.60 cm     Diastolic Velocity: 60.10 cm/s RV S prime:     10.00 cm/s  S/D Velocity:       0.70 TAPSE (M-mode): 1.9 cm      Systolic Velocity:  39.30 cm/s LEFT ATRIUM           Index        RIGHT ATRIUM           Index LA diam:      3.10 cm 1.37 cm/m   RA Area:     14.70 cm LA Vol (A2C): 36.1 ml 16.01 ml/m  RA Volume:   29.30 ml  13.00 ml/m LA Vol (A4C): 21.3 ml 9.45 ml/m  AORTIC VALVE AV Area (Vmax):    1.79 cm AV Area (Vmean):   1.73 cm AV Area (VTI):     1.60 cm AV Vmax:           134.67 cm/s AV Vmean:          94.267 cm/s AV VTI:            0.294 m AV Peak Grad:      7.3 mmHg AV Mean Grad:      4.3 mmHg LVOT Vmax:         76.60 cm/s LVOT Vmean:        51.900 cm/s LVOT VTI:          0.150 m LVOT/AV VTI ratio: 0.51  AORTA Ao Root diam: 2.50 cm MITRAL VALVE                TRICUSPID VALVE MV Area (PHT): 2.62 cm     TR Peak grad:   8.3 mmHg MV Area VTI:   1.03 cm     TR Vmax:        144.00 cm/s MV Peak grad:  7.0 mmHg MV Mean grad:  3.0 mmHg     SHUNTS MV Vmax:       1.32 m/s     Systemic VTI:  0.15 m MV Vmean:      79.4 cm/s    Systemic Diam: 2.00 cm MV Decel Time: 290 msec MV E velocity: 104.00 cm/s MV A velocity: 93.80 cm/s MV E/A ratio:  1.11 Evalene Lunger MD Electronically signed by Evalene Lunger MD Signature Date/Time: 10/18/2024/1:38:03 PM    Final    CT Angio Chest/Abd/Pel for Dissection W and/or Wo Contrast Result Date:  10/17/2024 EXAM: CTA CHEST, ABDOMEN AND PELVIS WITHOUT AND WITH CONTRAST 10/17/2024 08:48:55 PM TECHNIQUE: CTA of the chest was performed without and with the administration of 125 mL of intravenous iohexol  (OMNIPAQUE ) 350 MG/ML injection. CTA of the abdomen and pelvis was performed without and with the administration of 125 mL of intravenous iohexol  (OMNIPAQUE ) 350 MG/ML injection. Multiplanar reformatted images are provided for review. MIP images are provided for review. Automated exposure control, iterative reconstruction, and/or weight based adjustment of the mA/kV was utilized to reduce the  radiation dose to as low as reasonably achievable. COMPARISON: CT angiogram chest 09/14/2023. CT abdomen and pelvis 08/10/2023. CLINICAL HISTORY: Acute aortic syndrome (AAS) suspected. FINDINGS: VASCULATURE: Coronary and aortic atherosclerotic calcifications are noted. AORTA: No acute finding. No abdominal aortic aneurysm. No dissection. PULMONARY ARTERIES: No pulmonary embolism with the limits of this exam. GREAT VESSELS OF AORTIC ARCH: No acute finding. No dissection. No arterial occlusion or significant stenosis. 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 No occlusion. SUPERIOR MESENTERIC ARTERY: No acute finding. No occlusion or significant stenosis. INFERIOR MESENTERIC ARTERY: No acute finding. No occlusion or significant stenosis. RENAL ARTERIES: No acute finding. No occlusion or significant stenosis. ILIAC ARTERIES: No acute finding. No occlusion or significant stenosis. CHEST: MEDIASTINUM: Coronary and aortic atherosclerotic calcifications are noted. No mediastinal lymphadenopathy. The heart and pericardium demonstrate no acute abnormality. LUNGS AND PLEURA: There is a 2 mm nodule in the left lower lobe (image 6/101). No focal consolidation or pulmonary edema. No evidence of pleural effusion or pneumothorax. THORACIC BONES AND SOFT TISSUES: There are healed anterior right rib fractures.  No acute soft tissue abnormality. ABDOMEN AND PELVIS: LIVER: The liver is unremarkable. GALLBLADDER AND BILE DUCTS: Gallbladder is unremarkable. No biliary ductal dilatation. SPLEEN: The spleen is unremarkable. PANCREAS: The pancreas is unremarkable. ADRENAL GLANDS: Bilateral adrenal glands demonstrate no acute abnormality. KIDNEYS, URETERS AND BLADDER: No stones in the kidneys or ureters. No hydronephrosis. No perinephric or periureteral stranding. Urinary bladder is unremarkable. GI AND BOWEL: Stomach and duodenal sweep demonstrate no acute abnormality. The appendix is not visualized. There is no bowel obstruction. No abnormal bowel wall thickening or distension. REPRODUCTIVE: Reproductive organs are unremarkable. PERITONEUM AND RETROPERITONEUM: No ascites or free air. LYMPH NODES: No lymphadenopathy. ABDOMINAL BONES AND SOFT TISSUES: Right hip arthroplasty present. L5-S1 posterior fusion hardware present. No acute soft tissue abnormality. IMPRESSION: 1. No evidence of acute aortic syndrome. 2. Coronary and aortic atherosclerosis. 3. 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 4. 2 mm left lower lobe pulmonary nodule with no routine follow-up imaging recommended as per Fleischner Society Guidelines. Electronically signed by: Greig Pique MD 10/17/2024 10:08 PM EST RP Workstation: [id number]   DG Chest Port 1 View Result Date: 10/17/2024 EXAM: 1 VIEW(S) XRAY OF THE CHEST 10/17/2024 07:51:00 PM COMPARISON: 09/14/2023 CLINICAL HISTORY: Chest pain FINDINGS: LUNGS AND PLEURA: No focal pulmonary opacity. No pleural effusion. No pneumothorax. HEART AND MEDIASTINUM: No acute abnormality of the cardiac and mediastinal silhouettes. BONES AND SOFT TISSUES: No acute osseous abnormality. UPPER ABDOMEN: Paucity of bowel gas. IMPRESSION: 1. No acute cardiopulmonary abnormality. Electronically signed by: Greig Pique MD 10/17/2024 09:10 PM EST RP Workstation: [id number]    Labs: Basic Metabolic Panel: Recent Labs   Lab 10/17/24 1943  NA 139  K 4.8  CL 103  CO2 24  GLUCOSE 190*  BUN 23  CREATININE 1.10  CALCIUM  10.1   CBC: Recent Labs  Lab 10/17/24 1943  WBC 9.2  NEUTROABS 5.2  HGB 11.9*  HCT 37.2*  MCV 83.6  PLT 196   Microbiology: Results for orders placed or performed during the hospital encounter of 08/20/21  SARS CORONAVIRUS 2 (TAT 6-24 HRS) Nasopharyngeal Nasopharyngeal Swab     Status: None   Collection Time: 08/20/21  9:00 AM   Specimen: Nasopharyngeal Swab  Result Value Ref Range Status   SARS Coronavirus 2 NEGATIVE NEGATIVE Final    Comment: (NOTE) SARS-CoV-2 target nucleic acids are NOT DETECTED.  The SARS-CoV-2 RNA is generally detectable  in upper and lower respiratory specimens during the acute phase of infection. Negative results do not preclude SARS-CoV-2 infection, do not rule out co-infections with other pathogens, and should not be used as the sole basis for treatment or other patient management decisions. Negative results must be combined with clinical observations, patient history, and epidemiological information. The expected result is Negative.  Fact Sheet for Patients: hairslick.no  Fact Sheet for Healthcare Providers: quierodirigir.com  This test is not yet approved or cleared by the [place]  FDA and  has been authorized for detection and/or diagnosis of SARS-CoV-2 by FDA under an Emergency Use Authorization (EUA). This EUA will remain  in effect (meaning this test can be used) for the duration of the COVID-19 declaration under Se ction 564(b)(1) of the Act, 21 U.S.C. section 360bbb-3(b)(1), unless the authorization is terminated or revoked sooner.  Performed at [facility], [address], [place], [place] [address]    *Note: Due to a large number of results and/or encounters for the requested time period, some results have not been displayed. A complete set of results can be  found in Results Review.    Time coordinating discharge: Over 30 minutes  Marien LITTIE Piety, MD  [facility] 10/18/2024, 3:35 PM  "

## 2024-10-18 NOTE — Assessment & Plan Note (Addendum)
-   Will continue PPI therapy and Carafate .

## 2024-10-18 NOTE — Assessment & Plan Note (Signed)
Will continue CPAP nightly.

## 2024-10-18 NOTE — Care Management Obs Status (Signed)
 MEDICARE OBSERVATION STATUS NOTIFICATION   Patient Details  Name: Edward Schneider. MRN: [id number] Date of Birth: 16-Dec-1955   Medicare Observation Status Notification Given:  Yes    [name] 10/18/2024, 2:29 PM

## 2024-10-18 NOTE — Assessment & Plan Note (Addendum)
-   The patient will be admitted to an observation cardiac telemetry bed. - The patient has a history of coronary artery disease status post PCI and DES to the LAD. - Will follow serial troponins and EKGs. - The patient will be placed on aspirin  as well as p.r.n. sublingual nitroglycerin  and morphine  sulfate for pain. - We will obtain a cardiology consult in a.m. for further cardiac risk stratification. - I notified [facility] group about the patient

## 2024-10-18 NOTE — Plan of Care (Signed)
  Problem: Education: Goal: Understanding of cardiac disease, CV risk reduction, and recovery process will improve Outcome: Adequate for Discharge Goal: Individualized Educational Video(s) Outcome: Adequate for Discharge   Problem: Activity: Goal: Ability to tolerate increased activity will improve Outcome: Adequate for Discharge   Problem: Cardiac: Goal: Ability to achieve and maintain adequate cardiovascular perfusion will improve Outcome: Adequate for Discharge   Problem: Health Behavior/Discharge Planning: Goal: Ability to safely manage health-related needs after discharge will improve Outcome: Adequate for Discharge   Problem: Education: Goal: Knowledge of General Education information will improve Description: Including pain rating scale, medication(s)/side effects and non-pharmacologic comfort measures Outcome: Adequate for Discharge   Problem: Health Behavior/Discharge Planning: Goal: Ability to manage health-related needs will improve Outcome: Adequate for Discharge   Problem: Clinical Measurements: Goal: Ability to maintain clinical measurements within normal limits will improve Outcome: Adequate for Discharge Goal: Will remain free from infection Outcome: Adequate for Discharge Goal: Diagnostic test results will improve Outcome: Adequate for Discharge Goal: Respiratory complications will improve Outcome: Adequate for Discharge Goal: Cardiovascular complication will be avoided Outcome: Adequate for Discharge   Problem: Activity: Goal: Risk for activity intolerance will decrease Outcome: Adequate for Discharge   Problem: Nutrition: Goal: Adequate nutrition will be maintained Outcome: Adequate for Discharge   Problem: Coping: Goal: Level of anxiety will decrease Outcome: Adequate for Discharge   Problem: Elimination: Goal: Will not experience complications related to bowel motility Outcome: Adequate for Discharge Goal: Will not experience complications  related to urinary retention Outcome: Adequate for Discharge   Problem: Pain Managment: Goal: General experience of comfort will improve and/or be controlled Outcome: Adequate for Discharge   Problem: Safety: Goal: Ability to remain free from injury will improve Outcome: Adequate for Discharge   Problem: Skin Integrity: Goal: Risk for impaired skin integrity will decrease Outcome: Adequate for Discharge

## 2024-10-18 NOTE — Inpatient Diabetes Management (Signed)
 Inpatient Diabetes Program Recommendations  AACE/ADA: New Consensus Statement on Inpatient Glycemic Control (2015)  Target Ranges:  Prepandial:   less than 140 mg/dL      Peak postprandial:   less than 180 mg/dL (1-2 hours)      Critically ill patients:  140 - 180 mg/dL    Latest Reference Range & Units 10/16/24 09:59  Hemoglobin A1C 4.0 - 5.6 % 8.0 !  !: Data is abnormal  Latest Reference Range & Units 10/18/24 00:25  Glucose-Capillary 70 - 99 mg/dL 841 (H)  54 units Lantus   (H): Data is abnormally high    Admit with: CP  History: DM2  Home DM Meds: Farxiga  10 mg daily        Lantus  54 units at HS        Humalog  10 units 5x per day        Metformin  1000 mg daily  Current Orders: Farxiga  10 mg daily     Lantus  54 units at HS    MD- Please add Novolog  Moderate Correction Scale/ SSI (0-15 units) Q4H     --Will follow patient during hospitalization--  Adina Rudolpho Arrow RN, MSN, CDCES Diabetes Coordinator Inpatient Glycemic Control Team Team Pager: [phone] (8a-5p)

## 2024-10-20 ENCOUNTER — Encounter: Payer: Self-pay | Admitting: Ophthalmology

## 2024-10-23 ENCOUNTER — Other Ambulatory Visit: Payer: Self-pay | Admitting: Internal Medicine

## 2024-10-23 ENCOUNTER — Telehealth: Payer: Self-pay

## 2024-10-23 ENCOUNTER — Encounter: Payer: Self-pay | Admitting: Family Medicine

## 2024-10-23 VITALS — BP 142/82 | HR 70 | Ht 68.0 in | Wt 255.0 lb

## 2024-10-23 DIAGNOSIS — I2510 Atherosclerotic heart disease of native coronary artery without angina pectoris: Secondary | ICD-10-CM | POA: Diagnosis not present

## 2024-10-23 DIAGNOSIS — R911 Solitary pulmonary nodule: Secondary | ICD-10-CM

## 2024-10-23 DIAGNOSIS — F1120 Opioid dependence, uncomplicated: Secondary | ICD-10-CM | POA: Diagnosis not present

## 2024-10-23 MED ORDER — BENAZEPRIL HCL 10 MG PO TABS: 10.0000 mg | ORAL_TABLET | Freq: Every day | ORAL | 4 refills | Status: AC

## 2024-10-23 MED ORDER — OXYCODONE HCL 15 MG PO TABS: 15.0000 mg | ORAL_TABLET | ORAL | 0 refills | Status: AC | PRN

## 2024-10-23 MED ORDER — ATORVASTATIN CALCIUM 80 MG PO TABS: 80.0000 mg | ORAL_TABLET | Freq: Every day | ORAL | 1 refills | Status: AC

## 2024-10-23 NOTE — Assessment & Plan Note (Addendum)
 2 mm LLL pulmonary nodule.  Will plan to follow-up in 6 months.

## 2024-10-23 NOTE — Telephone Encounter (Signed)
 Patient does not hear me when I try calling x 4. He was given 150 tabs of Oxycodone  on 09/18/2024 and then went to ED on 10/18/24 in withdrawal and got 30 tabs. Will not get pain meds without appointment and CAN NOT WALK IN for pain med refill requests. Sending fpl group.  Copied from CRM [id number]. Topic: Clinical - Prescription Issue >> Oct 23, 2024  8:30 AM Emylou G wrote: Reason for CRM: Patient calling.. said he got oxyCODONE  (ROXICODONE ) 15 MG immediate release tablet from the hospital when he visited last Tuesday at the ER.  He is requesting a refill was told only could get from pain management or Dr Ziglar.. pls fwd to his doctor to review.  He said he had a hard time getting it with us  last week. Pls call him back on any delays if he can't get it.

## 2024-10-23 NOTE — Assessment & Plan Note (Signed)
 Worked up in the ED 10/17/2024 for ACS and ruled out.  Had TTE and nuclear stress test both of which were benign.
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
## 2024-10-24 NOTE — Discharge Instructions (Signed)

## 2024-10-25 ENCOUNTER — Ambulatory Visit
Admission: RE | Admit: 2024-10-25 | Discharge: 2024-10-25 | Disposition: A | Discharge status: Home / Self Care | Attending: Ophthalmology | Admitting: Ophthalmology

## 2024-10-25 ENCOUNTER — Encounter: Payer: Self-pay | Admitting: Ophthalmology

## 2024-10-25 ENCOUNTER — Other Ambulatory Visit: Payer: Self-pay

## 2024-10-25 ENCOUNTER — Ambulatory Visit: Admitting: General Practice

## 2024-10-25 ENCOUNTER — Encounter: Admission: RE | Disposition: A | Payer: Self-pay | Source: Home / Self Care | Attending: Ophthalmology

## 2024-10-25 DIAGNOSIS — I252 Old myocardial infarction: Secondary | ICD-10-CM | POA: Diagnosis not present

## 2024-10-25 DIAGNOSIS — G4733 Obstructive sleep apnea (adult) (pediatric): Secondary | ICD-10-CM | POA: Insufficient documentation

## 2024-10-25 DIAGNOSIS — I2510 Atherosclerotic heart disease of native coronary artery without angina pectoris: Secondary | ICD-10-CM | POA: Insufficient documentation

## 2024-10-25 DIAGNOSIS — G4730 Sleep apnea, unspecified: Secondary | ICD-10-CM | POA: Insufficient documentation

## 2024-10-25 DIAGNOSIS — I10 Essential (primary) hypertension: Secondary | ICD-10-CM | POA: Diagnosis not present

## 2024-10-25 DIAGNOSIS — K219 Gastro-esophageal reflux disease without esophagitis: Secondary | ICD-10-CM | POA: Insufficient documentation

## 2024-10-25 DIAGNOSIS — Z87891 Personal history of nicotine dependence: Secondary | ICD-10-CM | POA: Diagnosis not present

## 2024-10-25 DIAGNOSIS — H2511 Age-related nuclear cataract, right eye: Secondary | ICD-10-CM | POA: Diagnosis present

## 2024-10-25 DIAGNOSIS — J4489 Other specified chronic obstructive pulmonary disease: Secondary | ICD-10-CM | POA: Insufficient documentation

## 2024-10-25 DIAGNOSIS — F418 Other specified anxiety disorders: Secondary | ICD-10-CM | POA: Insufficient documentation

## 2024-10-25 DIAGNOSIS — E1136 Type 2 diabetes mellitus with diabetic cataract: Secondary | ICD-10-CM | POA: Diagnosis not present

## 2024-10-25 DIAGNOSIS — Z8673 Personal history of transient ischemic attack (TIA), and cerebral infarction without residual deficits: Secondary | ICD-10-CM | POA: Insufficient documentation

## 2024-10-25 HISTORY — PX: CATARACT EXTRACTION W/PHACO: SHX586

## 2024-10-25 LAB — GLUCOSE, CAPILLARY: Glucose-Capillary: 172 mg/dL — ABNORMAL HIGH (ref 70–99)

## 2024-10-25 SURGERY — PHACOEMULSIFICATION, CATARACT, WITH IOL INSERTION
Anesthesia: Monitor Anesthesia Care | Site: Eye | Laterality: Right

## 2024-10-25 MED ORDER — PHENYLEPHRINE HCL 10 % OP SOLN
1.0000 [drp] | OPHTHALMIC | Status: AC | PRN
  Administered 2024-10-25 (×3): 1 [drp] via OPHTHALMIC

## 2024-10-25 MED ORDER — CEFUROXIME OPHTHALMIC INJECTION 1 MG/0.1 ML
INJECTION | OPHTHALMIC | Status: DC | PRN
  Administered 2024-10-25: 1 mg via INTRACAMERAL

## 2024-10-25 MED ORDER — PROPOFOL 10 MG/ML IV BOLUS
INTRAVENOUS | Status: DC | PRN
  Administered 2024-10-25: 30 mg via INTRAVENOUS
  Administered 2024-10-25: 20 mg via INTRAVENOUS
  Administered 2024-10-25: 30 mg via INTRAVENOUS
  Administered 2024-10-25: 20 mg via INTRAVENOUS

## 2024-10-25 MED ORDER — CYCLOPENTOLATE HCL 2 % OP SOLN
1.0000 [drp] | OPHTHALMIC | Status: AC | PRN
  Administered 2024-10-25 (×3): 1 [drp] via OPHTHALMIC

## 2024-10-25 MED ORDER — BRIMONIDINE TARTRATE-TIMOLOL 0.2-0.5 % OP SOLN
OPHTHALMIC | Status: DC | PRN
  Administered 2024-10-25: 1 [drp] via OPHTHALMIC

## 2024-10-25 MED ORDER — PHENYLEPHRINE HCL 10 % OP SOLN
OPHTHALMIC | Status: AC
  Filled 2024-10-25: qty 5

## 2024-10-25 MED ORDER — SIGHTPATH DOSE#1 BSS IO SOLN
INTRAOCULAR | Status: DC | PRN
  Administered 2024-10-25: 71 mL via OPHTHALMIC

## 2024-10-25 MED ORDER — PROPOFOL 10 MG/ML IV BOLUS
INTRAVENOUS | Status: AC
  Filled 2024-10-25: qty 20

## 2024-10-25 MED ORDER — SIGHTPATH DOSE#1 BSS IO SOLN
INTRAOCULAR | Status: DC | PRN
  Administered 2024-10-25: 15 mL via INTRAOCULAR

## 2024-10-25 MED ORDER — FENTANYL CITRATE (PF) 100 MCG/2ML IJ SOLN
INTRAMUSCULAR | Status: AC
  Filled 2024-10-25: qty 2

## 2024-10-25 MED ORDER — MIDAZOLAM HCL (PF) 2 MG/2ML IJ SOLN
INTRAMUSCULAR | Status: DC | PRN
  Administered 2024-10-25: 2 mg via INTRAVENOUS

## 2024-10-25 MED ORDER — SIGHTPATH DOSE#1 NA HYALUR & NA CHOND-NA HYALUR IO KIT
PACK | INTRAOCULAR | Status: DC | PRN
  Administered 2024-10-25: 1 via OPHTHALMIC

## 2024-10-25 MED ORDER — CYCLOPENTOLATE HCL 2 % OP SOLN
OPHTHALMIC | Status: AC
  Filled 2024-10-25: qty 2

## 2024-10-25 MED ORDER — FENTANYL CITRATE (PF) 100 MCG/2ML IJ SOLN
INTRAMUSCULAR | Status: DC | PRN
  Administered 2024-10-25: 100 ug via INTRAVENOUS

## 2024-10-25 MED ORDER — MIDAZOLAM HCL 2 MG/2ML IJ SOLN
INTRAMUSCULAR | Status: AC
  Filled 2024-10-25: qty 2

## 2024-10-25 MED ORDER — DEXMEDETOMIDINE HCL IN NACL 80 MCG/20ML IV SOLN
INTRAVENOUS | Status: DC | PRN
  Administered 2024-10-25: 12 ug via INTRAVENOUS

## 2024-10-25 MED ORDER — LIDOCAINE HCL (PF) 2 % IJ SOLN
INTRAOCULAR | Status: DC | PRN
  Administered 2024-10-25: 2 mL

## 2024-10-25 MED ORDER — TETRACAINE HCL 0.5 % OP SOLN
OPHTHALMIC | Status: AC
  Filled 2024-10-25: qty 4

## 2024-10-25 MED ORDER — TETRACAINE HCL 0.5 % OP SOLN
1.0000 [drp] | OPHTHALMIC | Status: DC | PRN
  Administered 2024-10-25 (×3): 1 [drp] via OPHTHALMIC

## 2024-10-25 SURGICAL SUPPLY — 7 items
FEE CATARACT SUITE SIGHTPATH (MISCELLANEOUS) ×1 IMPLANT
GLOVE BIOGEL PI IND STRL 8 (GLOVE) ×1 IMPLANT
GLOVE SURG LX STRL 7.5 STRW (GLOVE) ×1 IMPLANT
GLOVE SURG SYN 6.5 PF PI BL (GLOVE) ×1 IMPLANT
LENS IOL CLRN TRC 3 21.5 IMPLANT
NEEDLE FILTER BLUNT 18X1 1/2 (NEEDLE) ×1 IMPLANT
SYR 3ML LL SCALE MARK (SYRINGE) ×1 IMPLANT

## 2024-10-25 NOTE — Transfer of Care (Signed)
 Immediate Anesthesia Transfer of Care Note  Patient: Edward Schneider.  Procedure(s) Performed: PHACOEMULSIFICATION, CATARACT, WITH IOL INSERTION 3.98 00:20.7 (Right: Eye)  Patient Location: PACU  Anesthesia Type: MAC  Level of Consciousness: awake, alert  and patient cooperative  Airway and Oxygen  Therapy: Patient Spontanous Breathing   Post-op Assessment: Post-op Vital signs reviewed, Patient's Cardiovascular Status Stable, Respiratory Function Stable, Patent Airway and No signs of Nausea or vomiting  Post-op Vital Signs: Reviewed and stable  Complications: No notable events documented.

## 2024-10-25 NOTE — H&P (Signed)
 " [facility]   Primary Care Physician:  Onita Devere POUR, MD Ophthalmologist: Dr. Dene Etienne  Pre-Procedure History & Physical: HPI:  Edward Schneider. is a 69 y.o. male here for ophthalmic surgery.   Past Medical History:  Diagnosis Date   Allergy    Aortic atherosclerosis    Aortic valve sclerosis    a. 08/2021 Echo: EF 60-65%, no rwma, mild LVH, nl RV fxn, mildly dil RA, AoV sclerosis w/o stenosis.   Asthma    C. difficile diarrhea    Chronic pain    Collagenous colitis    Coronary artery disease    a. 08/2019 Cath/PCI: LM 60p/m (FFR 0.87-->2.75 x 18 Resolute Onyx DES). D1/2/3 nl, RI nl, LCX 20p/m, RCA/RPDA nl, RPAV 50. EF 55-65%.   DDD (degenerative disc disease), cervical    DDD (degenerative disc disease), lumbar    GERD (gastroesophageal reflux disease)    Grade I diastolic dysfunction    Hepatic steatosis    Hyperlipidemia    Hypertension    Liver cancer (HCC) 03/2015   Migraines    Myocardial infarction (HCC)     OSA on CPAP    Seizures (HCC)    several as child when sick.  None since age 66   Stroke (HCC)    'mini-stroke 30 yrs ago. no deficits.   T2DM (type 2 diabetes mellitus) (HCC)    Wears dentures    full upper and lower    Past Surgical History:  Procedure Laterality Date   APPENDECTOMY     BACK SURGERY     CARDIAC CATHETERIZATION     No stent placed in his 5's   CATARACT EXTRACTION W/PHACO Left 09/27/2024   Procedure: PHACOEMULSIFICATION, CATARACT, WITH IOL INSERTION 4.76 00:36.4;  Surgeon: Etienne Dene, MD;  Location: [facility];  Service: Ophthalmology;  Laterality: Left;   CERVICAL FUSION     COLONOSCOPY WITH PROPOFOL  N/A 03/06/2016   Procedure: COLONOSCOPY WITH PROPOFOL ;  Surgeon: Rogelia Copping, MD;  Location: [facility];  Service: Endoscopy;  Laterality: N/A;  requests early   COLONOSCOPY WITH PROPOFOL  N/A 06/18/2020   Procedure: COLONOSCOPY WITH PROPOFOL ;  Surgeon: Copping Rogelia, MD;  Location: [facility]  ENDOSCOPY;  Service: Endoscopy;  Laterality: N/A;   CORONARY PRESSURE/FFR STUDY N/A 09/05/2019   Procedure: INTRAVASCULAR PRESSURE WIRE/FFR STUDY;  Surgeon: Mady Bruckner, MD;  Location: [facility] INVASIVE CV LAB;  Service: Cardiovascular;  Laterality: N/A;   ESOPHAGOGASTRODUODENOSCOPY (EGD) WITH PROPOFOL  N/A 09/20/2017   Procedure: ESOPHAGOGASTRODUODENOSCOPY (EGD) WITH PROPOFOL ;  Surgeon: Copping Rogelia, MD;  Location: [facility];  Service: Endoscopy;  Laterality: N/A;  Diabetic - oral meds   FINGER SURGERY Left    KNEE SURGERY Right    LEFT HEART CATH AND CORONARY ANGIOGRAPHY Left 09/05/2019   Procedure: LEFT HEART CATH AND CORONARY ANGIOGRAPHY (2.75 x 18 mm Resolute Onyx DES x 1 to prox/mid LAD);  Surgeon: Mady Bruckner, MD;  Location: [facility] INVASIVE CV LAB;  Service: Cardiovascular;  Laterality: Left;   NECK SURGERY     spleen surgery     TOE SURGERY Right    TOTAL HIP ARTHROPLASTY Right 06/03/2021   Procedure: TOTAL HIP ARTHROPLASTY ANTERIOR APPROACH;  Surgeon: Kathlynn Sharper, MD;  Location: [facility] ORS;  Service: Orthopedics;  Laterality: Right;   TOTAL KNEE ARTHROPLASTY Right 08/22/2021   Procedure: TOTAL KNEE ARTHROPLASTY;  Surgeon: Kathlynn Sharper, MD;  Location: [facility] ORS;  Service: Orthopedics;  Laterality: Right;    Prior to Admission medications  Medication Sig Start Date  End Date Taking? Authorizing Provider  Accu-Chek Softclix Lancets lancets Check blood sugar 2 times daily 04/06/23  Yes Shamleffer, Ibtehal Jaralla, MD  acetaminophen  (TYLENOL  8 HOUR) 650 MG CR tablet 2 tablets as needed Orally Twice a day For Pain   Yes [provider]  acetaminophen  (TYLENOL ) 650 MG CR tablet Take 1,300 mg by mouth every 8 (eight) hours.   Yes [provider]  acetaminophen -caffeine  (EXCEDRIN  TENSION HEADACHE) 500-65 MG TABS per tablet Take 1 tablet by mouth as needed.   Yes [provider]  albuterol  (VENTOLIN  HFA) 108 (90 Base) MCG/ACT inhaler Inhale 2 puffs into the  lungs every 6 (six) hours as needed for wheezing or shortness of breath. 01/24/24  Yes Olalere, Adewale A, MD  Ascorbic Acid  (VITAMIN C ) 1000 MG tablet Take 1,000 mg by mouth daily.    Yes [provider]  aspirin  EC 81 MG tablet Take 81 mg by mouth daily. Swallow whole.   Yes [provider]  benazepril  (LOTENSIN ) 10 MG tablet Take 1 tablet (10 mg total) by mouth daily. 10/23/24  Yes Ziglar, Susan K, MD  bisacodyl  (DULCOLAX) 5 MG EC tablet Take 2 tablets (10 mg total) by mouth daily in the afternoon. 03/06/22  Yes Patel, Sona, MD  budeson-glycopyrrolate -formoterol  (BREZTRI  AEROSPHERE) 160-9-4.8 MCG/ACT AERO Inhale 2 puffs into the lungs in the morning and at bedtime. 01/24/24  Yes Olalere, Adewale A, MD  calcium  carbonate (OS-CAL) 600 MG TABS tablet Take 600 mg by mouth daily.    Yes [provider]  cetirizine  (ZYRTEC ) 10 MG tablet Take 1 tablet (10 mg total) by mouth daily. 04/17/22  Yes Cannady, Jolene T, NP  Cholecalciferol  (VITAMIN D3) 25 MCG (1000 UT) CHEW Chew 1,000 Units by mouth daily.    Yes [provider]  Chromium-Cinnamon  (CINNAMON  PLUS CHROMIUM) [phone] MCG-MG CAPS 1 tablet Orally twice a day Daily supplement/metabolism support   Yes [provider]  CINNAMON  PO Take 1,000 mg by mouth 2 (two) times daily.    Yes [provider]  clobetasol  cream (TEMOVATE ) 0.05 % Apply 1 application topically 2 (two) times daily. 07/17/20  Yes Daphane Rosella, NP  COMFORT EZ PEN NEEDLES 32G X 4 MM MISC Use to inject insulin  5 times daily 01/18/23  Yes Shamleffer, Ibtehal Jaralla, MD  Continuous Glucose Sensor (FREESTYLE LIBRE 3 PLUS SENSOR) MISC Change sensor every 15 days. 03/30/24  Yes Shamleffer, Ibtehal Jaralla, MD  cyclobenzaprine  (FLEXERIL ) 10 MG tablet Take 1 tablet (10 mg total) by mouth 3 (three) times daily as needed for muscle spasms. 01/07/18  Yes Jinny Carmine, MD  dapagliflozin  propanediol (FARXIGA ) 10 MG TABS tablet Take 1 tablet (10 mg total) by  mouth daily before breakfast. 04/17/22  Yes Cannady, Jolene T, NP  diclofenac  Sodium (VOLTAREN ) 1 % GEL Apply 2 g topically 4 (four) times daily as needed (pain).  07/10/20  Yes [provider]  doxepin  (SINEQUAN ) 25 MG capsule Take 2 capsules (50 mg total) by mouth at bedtime. 07/08/22  Yes Cannady, Jolene T, NP  DULoxetine  (CYMBALTA ) 60 MG capsule TAKE 1 CAPSULE(60 MG) BY MOUTH AT BEDTIME 10/13/24  Yes Ziglar, Susan K, MD  EPINEPHrine  0.3 mg/0.3 mL IJ SOAJ injection Inject 0.3 mg into the muscle as needed for anaphylaxis. 12/12/21  Yes Johnson, Megan P, DO  ezetimibe  (ZETIA ) 10 MG tablet Take 1 tablet (10 mg total) by mouth daily. 04/17/22  Yes Cannady, Jolene T, NP  famotidine  (PEPCID ) 20 MG tablet Take 1 tablet (20 mg total) by  mouth 2 (two) times daily. 06/13/24  Yes Ziglar, Susan K, MD  Flaxseed, Linseed, (FLAXSEED OIL) 1200 MG CAPS Take 1 capsule by mouth daily.   Yes [provider]  fluticasone (FLONASE) 50 MCG/ACT nasal spray Place into both nostrils. 11/16/23  Yes [provider]  gabapentin  (NEURONTIN ) 300 MG capsule Take 300 mg by mouth 3 (three) times daily.   Yes [provider]  Ginkgo Biloba 60 MG TABS Take 1 tablet by mouth daily.   Yes [provider]  Ginseng 100 MG CAPS Take 100 mg by mouth daily.    Yes [provider]  glucose blood (ACCU-CHEK GUIDE) test strip Check blood sugar 2 times daily 04/06/23  Yes Shamleffer, Ibtehal Jaralla, MD  HUMALOG  KWIKPEN 200 UNIT/ML KwikPen DIAL  AND INJECT 22 UNITS UNDER THE SKIN THREE TIMES A DAY WITH MEALS PLUS CORRECTION SCALE. MAX DAILY DOSE IS 100 UNITS. 10/23/24  Yes Shamleffer, Ibtehal Jaralla, MD  hydrocortisone  (ANUSOL -HC) 25 MG suppository Place 1 suppository (25 mg total) rectally 2 (two) times daily as needed for hemorrhoids. 09/04/24  Yes Ziglar, Susan K, MD  isosorbide  mononitrate (IMDUR ) 60 MG 24 hr tablet Take 1 tablet (60 mg total) by mouth daily. 11/03/22  Yes Cannady, Jolene T, NP   LANTUS  100 UNIT/ML injection Inject 0.54 mLs (54 Units total) into the skin daily. 10/18/24  Yes Lenon Marien CROME, MD  lidocaine  (XYLOCAINE ) 2 % solution Use as directed 15 mLs in the mouth or throat as needed for mouth pain. 12/12/21  Yes Johnson, Megan P, DO  metFORMIN  (GLUCOPHAGE -XR) 500 MG 24 hr tablet Take 2 tablets (1,000 mg total) by mouth daily with breakfast. 10/16/24  Yes Shamleffer, Ibtehal Jaralla, MD  Minoxidil 5 % FOAM Apply 1 g topically daily. 11/23/22  Yes [provider]  Misc Natural Products (BEET ROOT) 500 MG CAPS 1 tablet Orally twice a day Daily supplement/general wellness   Yes [provider]  Multiple Vitamins-Minerals (CENTRUM SILVER 50+MEN) TABS Take 1 tablet by mouth daily.   Yes [provider]  mupirocin  ointment (BACTROBAN ) 2 % Apply 1 Application topically 2 (two) times daily. 07/02/22  Yes Cannady, Jolene T, NP  Omega-3 1000 MG CAPS Take 1,000 mg by mouth daily.    Yes [provider]  ondansetron  (ZOFRAN ) 4 MG tablet TAKE 1 TABLET(4 MG) BY MOUTH EVERY 8 HOURS AS NEEDED FOR NAUSEA OR VOMITING 01/22/23  Yes Cannady, Jolene T, NP  oxyCODONE  (ROXICODONE ) 15 MG immediate release tablet Take 1 tablet (15 mg total) by mouth every 4 (four) hours as needed. 12/17/24 01/16/25 Yes Ziglar, Susan K, MD  oxyCODONE  (ROXICODONE ) 15 MG immediate release tablet Take 1 tablet (15 mg total) by mouth every 4 (four) hours as needed. 11/20/24  Yes Ziglar, Susan K, MD  oxyCODONE  (ROXICODONE ) 15 MG immediate release tablet Take 1 tablet (15 mg total) by mouth every 4 (four) hours as needed. 10/23/24 11/22/24 Yes Ziglar, Susan K, MD  pantoprazole  (PROTONIX ) 40 MG tablet TAKE 1 TABLET(40 MG) BY MOUTH TWICE DAILY 06/01/22  Yes Cannady, Jolene T, NP  polyethylene glycol (MIRALAX  / GLYCOLAX ) 17 g packet Take 17 g by mouth 2 (two) times daily. 03/18/22  Yes Cannady, Jolene T, NP  sucralfate  (CARAFATE ) 1 g tablet TAKE 1 TABLET BY MOUTH FOUR TIMES DAILY WITH MEALS AND AT  BEDTIME 07/26/24  Yes Ziglar, Susan K, MD  traZODone  (DESYREL ) 100 MG tablet Take 1 tablet (100 mg total) by mouth at bedtime as needed. for sleep 04/17/22  Yes Cannady, Jolene T, NP  triamcinolone  (KENALOG ) 0.025 % cream Apply 1 Application topically 2 (two) times daily. 08/26/24  Yes [provider]  vitamin A  10000 UNIT capsule Take 10,000 Units by mouth daily.   Yes [provider]  vitamin B-12 (CYANOCOBALAMIN ) 1000 MCG tablet Take 1,000 mcg by mouth daily.   Yes [provider]  Vitamin E  400 units TABS Take 400 Units by mouth daily.    Yes [provider]  Alcohol Swabs (PHARMACIST CHOICE ALCOHOL) PADS SMARTSIG:1 Each Topical 4 Times Daily 09/18/21   [provider]  atorvastatin  (LIPITOR ) 80 MG tablet Take 1 tablet (80 mg total) by mouth daily. 10/23/24   Ziglar, Susan K, MD  mometasone  (ELOCON ) 0.1 % ointment Apply topically 2 (two) times daily as needed. 12/23/21   [provider]  NARCAN  4 MG/0.1ML LIQD nasal spray kit Place 0.4 mg into the nose as needed (opioid overdose). Never uses 02/20/19   [provider]  nitroGLYCERIN  (NITROSTAT ) 0.4 MG SL tablet Place 1 tablet (0.4 mg total) under the tongue every 5 (five) minutes as needed for chest pain. 06/13/24   Ziglar, Devere POUR, MD    Allergies as of 08/29/2024 - Review Complete 08/29/2024  Allergen Reaction Noted   Bee pollen Anaphylaxis 08/29/2013   Bee venom Anaphylaxis 02/22/2014   Crestor  [rosuvastatin  calcium ] Shortness Of Breath and Swelling 03/03/2017   Fentanyl  Itching and Hives 03/27/2015   Gabapentin  Diarrhea 02/22/2014   Shellfish allergy Anaphylaxis and Swelling 03/20/2014   Chlorhexidine   11/23/2022   Fire ant  01/05/2022   Furosemide  Nausea And Vomiting 01/01/2020   Buprenorphine hcl Itching 10/10/2015   Chlorhexidine  gluconate Itching and Rash 08/13/2021   Simvastatin Diarrhea 04/25/2015    Family History  Problem Relation Age of Onset   Arthritis Mother     Diabetes Mother    Kidney disease Mother    Heart disease Mother    Hypertension Mother    Arthritis Father    Hearing loss Father    Hypertension Father    Heart disease Father    Diabetes Sister    Heart disease Sister    Diabetes Daughter    Diabetes Maternal Aunt    Diabetes Maternal Grandmother    Heart Problems Brother    Heart Problems Brother    Heart Problems Brother    Heart attack Maternal Grandfather    Colon cancer Paternal Grandfather     Social History   Socioeconomic History   Marital status: Married    Spouse name: Not on file   Number of children: Not on file   Years of education: 13.5   Highest education level: Some college, no degree  Occupational History   Occupation: disability   Tobacco Use   Smoking status: Former    Current packs/day: 0.00    Average packs/day: 2.0 packs/day for 50.0 years (100.0 ttl pk-yrs)    Types: Cigarettes    Start date: 63    Quit date: 2011    Years since quitting: 15.0    Passive exposure: Past   Smokeless tobacco: Never  Vaping Use   Vaping status: Never Used  Substance and Sexual Activity   Alcohol use: No    Alcohol/week: 0.0 standard drinks of alcohol   Drug use: Yes    Comment: prescribed narcotics   Sexual activity: Not Currently  Other Topics Concern   Not on file  Social History Narrative   Right handed    Lives with wife  Social Drivers of Health   Tobacco Use: Medium Risk (10/25/2024)   Patient History    Smoking Tobacco Use: Former    Smokeless Tobacco Use: Never    Passive Exposure: Past  Physicist, Medical Strain: Low Risk (07/08/2022)   Overall Financial Resource Strain (CARDIA)    Difficulty of Paying Living Expenses: Not hard at all  Food Insecurity: No Food Insecurity (10/18/2024)   Epic    Worried About Programme Researcher, Broadcasting/film/video in the Last Year: Never true    Ran Out of Food in the Last Year: Never true  Transportation Needs: No Transportation Needs (10/18/2024)   Epic    Lack of  Transportation (Medical): No    Lack of Transportation (Non-Medical): No  Physical Activity: Inactive (08/29/2024)   Exercise Vital Sign    Days of Exercise per Week: 0 days    Minutes of Exercise per Session: Patient declined  Stress: No Stress Concern Present (08/29/2024)   [employer] of Occupational Health - Occupational Stress Questionnaire    Feeling of Stress: Not at all  Social Connections: Moderately Integrated (10/18/2024)   Social Connection and Isolation Panel    Frequency of Communication with Friends and Family: More than three times a week    Frequency of Social Gatherings with Friends and Family: Never    Attends Religious Services: More than 4 times per year    Active Member of [employer] or Organizations: No    Attends Banker Meetings: Never    Marital Status: Married  Recent Concern: Social Connections - Moderately Isolated (08/29/2024)   Social Connection and Isolation Panel    Frequency of Communication with Friends and Family: Once a week    Frequency of Social Gatherings with Friends and Family: Never    Attends Religious Services: More than 4 times per year    Active Member of Clubs or Organizations: No    Attends Banker Meetings: Never    Marital Status: Married  Catering Manager Violence: Not At Risk (10/18/2024)   Epic    Fear of Current or Ex-Partner: No    Emotionally Abused: No    Physically Abused: No    Sexually Abused: No  Depression (PHQ2-9): Medium Risk (10/23/2024)   Depression (PHQ2-9)    PHQ-2 Score: 10  Alcohol Screen: Low Risk (07/08/2022)   Alcohol Screen    Last Alcohol Screening Score (AUDIT): 0  Housing: Low Risk (10/18/2024)   Epic    Unable to Pay for Housing in the Last Year: No    Number of Times Moved in the Last Year: 0    Homeless in the Last Year: No  Utilities: Not At Risk (10/18/2024)   Epic    Threatened with loss of utilities: No  Health Literacy: Adequate Health Literacy (08/29/2024)    B1300 Health Literacy    Frequency of need for help with medical instructions: Never    Review of Systems: See HPI, otherwise negative ROS  Physical Exam: BP (!) 161/80   Pulse 70   Temp 98.3 F (36.8 C) (Temporal)   Resp 17   Ht 5' 8 (1.727 m)   Wt 115.7 kg   SpO2 94%   BMI 38.77 kg/m  General:   Alert,  pleasant and cooperative in NAD Head:  Normocephalic and atraumatic. Lungs:  Clear to auscultation.    Heart:  Regular rate and rhythm.   Impression/Plan: Edward Schneider. is here for ophthalmic surgery.  Risks, benefits, limitations, and alternatives regarding  ophthalmic surgery have been reviewed with the patient.  Questions have been answered.  All parties agreeable.   MITTIE GASKIN, MD  10/25/2024, 7:31 AM ' "

## 2024-10-25 NOTE — Anesthesia Postprocedure Evaluation (Signed)
"   Anesthesia Post Note  Patient: Edward Schneider.  Procedure(s) Performed: PHACOEMULSIFICATION, CATARACT, WITH IOL INSERTION 3.98 00:20.7 (Right: Eye)  Anesthesia Type: MAC Anesthetic complications: no   No notable events documented.   Last Vitals:  Vitals:   10/25/24 0822 10/25/24 0829  BP: 115/78 113/73  Pulse: 67 66  Resp: 20 11  Temp: 36.7 C 36.7 C  SpO2: 95% 95%    Last Pain:  Vitals:   10/25/24 0829  TempSrc:   PainSc: 0-No pain                 Edward Schneider      "

## 2024-10-25 NOTE — Anesthesia Preprocedure Evaluation (Signed)
"                                    Anesthesia Evaluation  Patient identified by MRN, date of birth, ID band Patient awake    Reviewed: Allergy & Precautions, H&P , NPO status , Patient's Chart, lab work & pertinent test results  Airway Mallampati: III  TM Distance: >3 FB Neck ROM: Full    Dental no notable dental hx. (+) Caps   Pulmonary asthma , sleep apnea , COPD, former smoker   Pulmonary exam normal breath sounds clear to auscultation       Cardiovascular hypertension, + CAD and + Past MI  Normal cardiovascular exam Rhythm:Regular Rate:Normal     Neuro/Psych  Headaches, Seizures -,  PSYCHIATRIC DISORDERS Anxiety Depression     Neuromuscular disease CVA    GI/Hepatic Neg liver ROS,GERD  ,,  Endo/Other  diabetes    Renal/GU Renal disease  negative genitourinary   Musculoskeletal negative musculoskeletal ROS (+)    Abdominal   Peds negative pediatric ROS (+)  Hematology negative hematology ROS (+)   Anesthesia Other Findings   Reproductive/Obstetrics negative OB ROS                              Anesthesia Physical Anesthesia Plan  ASA: 3  Anesthesia Plan: MAC   Post-op Pain Management: Minimal or no pain anticipated   Induction: Intravenous  PONV Risk Score and Plan: 0  Airway Management Planned: Nasal Cannula  Additional Equipment:   Intra-op Plan:   Post-operative Plan:   Informed Consent: I have reviewed the patients History and Physical, chart, labs and discussed the procedure including the risks, benefits and alternatives for the proposed anesthesia with the patient or authorized representative who has indicated his/her understanding and acceptance.       Plan Discussed with: CRNA  Anesthesia Plan Comments:         Anesthesia Quick Evaluation  "

## 2024-10-25 NOTE — Op Note (Signed)
 LOCATION:  [facility]   PREOPERATIVE DIAGNOSIS:  Nuclear sclerotic cataract of the right eye.  H25.11   POSTOPERATIVE DIAGNOSIS:  Nuclear sclerotic cataract of the right eye.   PROCEDURE:  Phacoemulsification with Toric posterior chamber intraocular lens placement of the right eye.  Ultrasound time: Procedures: PHACOEMULSIFICATION, CATARACT, WITH IOL INSERTION 3.98 00:20.7 (Right)  LENS:   Implant Name Type Inv. Item Serial No. Manufacturer Lot No. LRB No. Used Action  LENS IOL CLRN TRC 3 21.5 - [id number]  LENS IOL CLRN TRC 3 21.5 [id number] [employer]  Right 1 Implanted     [id number] Toric intraocular lens with 1.5 diopters of cylindrical power with axis orientation at 120 degrees.   SURGEON:  Dene FABIENE Etienne, MD   ANESTHESIA: Topical with tetracaine  drops and 2% Xylocaine  jelly, augmented with 1% preservative-free intracameral lidocaine . .   COMPLICATIONS:  None.   DESCRIPTION OF PROCEDURE:  The patient was identified in the holding room and transported to the operating suite and placed in the supine position under the operating microscope.  The right eye was identified as the operative eye, and it was prepped and draped in the usual sterile ophthalmic fashion.    A clear-corneal paracentesis incision was made at the 12:00 position.  0.5 ml of preservative-free 1% lidocaine  was injected into the anterior chamber. The anterior chamber was filled with Viscoat.  A 2.4 millimeter near clear corneal incision was then made at the 9:00 position.  A cystotome and capsulorrhexis forceps were then used to make a curvilinear capsulorrhexis.  Hydrodissection and hydrodelineation were then performed using balanced salt solution.   Phacoemulsification was then used in stop and chop fashion to remove the lens, nucleus and epinucleus.  The remaining cortex was aspirated using the irrigation and aspiration handpiece.  Provisc viscoelastic was then placed into the capsular bag to distend  it for lens placement.  The Verion digital marker was used to align the implant at the intended axis.   A Toric lens was then injected into the capsular bag.  It was rotated clockwise until the axis marks on the lens were approximately 15 degrees in the counterclockwise direction to the intended alignment.  The viscoelastic was aspirated from the eye using the irrigation aspiration handpiece.  Then, a Koch spatula through the sideport incision was used to rotate the lens in a clockwise direction until the axis markings of the intraocular lens were lined up with the Verion alignment.  Balanced salt solution was then used to hydrate the wounds. Cefuroxime  0.1 ml of a 10mg /ml solution was injected into the anterior chamber for a dose of 1 mg of intracameral antibiotic at the completion of the case.    The eye was noted to have a physiologic pressure and there was no wound leak noted.   Timolol  and Brimonidine  drops were applied to the eye.  The patient was taken to the recovery room in stable condition having had no complications of anesthesia or surgery.  Ramaj Frangos 10/25/2024, 8:21 AM

## 2024-10-26 ENCOUNTER — Ambulatory Visit: Admitting: Pulmonary Disease

## 2024-10-27 ENCOUNTER — Ambulatory Visit: Admitting: Family Medicine

## 2024-11-01 ENCOUNTER — Encounter: Payer: Self-pay | Admitting: Pulmonary Disease

## 2024-11-01 ENCOUNTER — Ambulatory Visit: Admitting: Pulmonary Disease

## 2024-11-01 DIAGNOSIS — Z87891 Personal history of nicotine dependence: Secondary | ICD-10-CM

## 2024-11-01 DIAGNOSIS — J432 Centrilobular emphysema: Secondary | ICD-10-CM

## 2024-11-01 DIAGNOSIS — M0609 Rheumatoid arthritis without rheumatoid factor, multiple sites: Secondary | ICD-10-CM

## 2024-11-01 DIAGNOSIS — J449 Chronic obstructive pulmonary disease, unspecified: Secondary | ICD-10-CM

## 2024-11-01 DIAGNOSIS — G4733 Obstructive sleep apnea (adult) (pediatric): Secondary | ICD-10-CM | POA: Diagnosis not present

## 2024-11-01 MED ORDER — BUDESONIDE 0.5 MG/2ML IN SUSP: 0.5000 mg | Freq: Two times a day (BID) | RESPIRATORY_TRACT | 0 refills | Status: DC

## 2024-11-01 MED ORDER — BREZTRI AEROSPHERE 160-9-4.8 MCG/ACT IN AERO: 2.0000 | INHALATION_SPRAY | Freq: Two times a day (BID) | RESPIRATORY_TRACT | 5 refills | Status: AC

## 2024-11-01 MED ORDER — ALBUTEROL SULFATE HFA 108 (90 BASE) MCG/ACT IN AERS: 2.0000 | INHALATION_SPRAY | Freq: Four times a day (QID) | RESPIRATORY_TRACT | 11 refills | Status: DC | PRN

## 2024-11-01 MED ORDER — ALBUTEROL SULFATE (2.5 MG/3ML) 0.083% IN NEBU: 2.5000 mg | INHALATION_SOLUTION | Freq: Four times a day (QID) | RESPIRATORY_TRACT | 12 refills | Status: AC | PRN

## 2024-11-01 MED ORDER — BREZTRI AEROSPHERE 160-9-4.8 MCG/ACT IN AERO: 2.0000 | INHALATION_SPRAY | Freq: Two times a day (BID) | RESPIRATORY_TRACT | Status: DC

## 2024-11-01 MED ORDER — GUAIFENESIN-CODEINE 100-10 MG/5ML PO SOLN: 10.0000 mL | Freq: Four times a day (QID) | ORAL | 0 refills | Status: DC | PRN

## 2024-11-01 MED ORDER — ALBUTEROL SULFATE (2.5 MG/3ML) 0.083% IN NEBU: 2.5000 mg | INHALATION_SOLUTION | Freq: Four times a day (QID) | RESPIRATORY_TRACT | 3 refills | Status: AC | PRN

## 2024-11-01 MED ORDER — ALBUTEROL SULFATE (2.5 MG/3ML) 0.083% IN NEBU: 2.5000 mg | INHALATION_SOLUTION | Freq: Four times a day (QID) | RESPIRATORY_TRACT | 3 refills | Status: DC | PRN

## 2024-11-01 NOTE — Progress Notes (Signed)
 [address]              Edward Schneider    [id number]    1956/04/04  Primary Care Physician:Ziglar, Devere POUR, MD  Referring Physician: Ziglar, Susan K, MD [address] [place],  [place] [address]  Chief complaint:   Patient with shortness of breath, obstructive sleep apnea  HPI:  Admitted to the hospital in December for heart problems, said he had a heart attack  Lost his brother to chronic illness recently  Has been more short of breath recently with most activities Increase use of his inhalers  He is on Breztri  and albuterol   He is on BiPAP at night for severe obstructive sleep apnea, compliant with BiPAP  He has rheumatoid arthritis, musculoskeletal pain and discomfort  He does not have a nebulizer unit currently but, will benefit from 1   Outpatient Encounter Medications as of 11/01/2024  Medication Sig   Accu-Chek Softclix Lancets lancets Check blood sugar 2 times daily   acetaminophen  (TYLENOL  8 HOUR) 650 MG CR tablet 2 tablets as needed Orally Twice a day For Pain   acetaminophen  (TYLENOL ) 650 MG CR tablet Take 1,300 mg by mouth every 8 (eight) hours.   acetaminophen -caffeine  (EXCEDRIN  TENSION HEADACHE) 500-65 MG TABS per tablet Take 1 tablet by mouth as needed.   Alcohol Swabs (PHARMACIST CHOICE ALCOHOL) PADS SMARTSIG:1 Each Topical 4 Times Daily   Ascorbic Acid  (VITAMIN C ) 1000 MG tablet Take 1,000 mg by mouth daily.    aspirin  EC 81 MG tablet Take 81 mg by mouth daily. Swallow whole.   atorvastatin  (LIPITOR ) 80 MG tablet Take 1 tablet (80 mg total) by mouth daily.   benazepril  (LOTENSIN ) 10 MG tablet Take 1 tablet (10 mg total) by mouth daily.   bisacodyl  (DULCOLAX) 5 MG EC tablet Take 2 tablets (10 mg total) by mouth daily in the afternoon.   budesonide  (PULMICORT ) 0.5 MG/2ML nebulizer solution Take 2 mLs (0.5 mg total) by nebulization in the morning and at bedtime.   calcium  carbonate (OS-CAL) 600 MG TABS tablet Take 600 mg by mouth daily.    cetirizine  (ZYRTEC ) 10 MG  tablet Take 1 tablet (10 mg total) by mouth daily.   Cholecalciferol  (VITAMIN D3) 25 MCG (1000 UT) CHEW Chew 1,000 Units by mouth daily.    Chromium-Cinnamon  (CINNAMON  PLUS CHROMIUM) [phone] MCG-MG CAPS 1 tablet Orally twice a day Daily supplement/metabolism support   CINNAMON  PO Take 1,000 mg by mouth 2 (two) times daily.    clobetasol  cream (TEMOVATE ) 0.05 % Apply 1 application topically 2 (two) times daily.   COMFORT EZ PEN NEEDLES 32G X 4 MM MISC Use to inject insulin  5 times daily   Continuous Glucose Sensor (FREESTYLE LIBRE 3 PLUS SENSOR) MISC Change sensor every 15 days.   cyclobenzaprine  (FLEXERIL ) 10 MG tablet Take 1 tablet (10 mg total) by mouth 3 (three) times daily as needed for muscle spasms.   dapagliflozin  propanediol (FARXIGA ) 10 MG TABS tablet Take 1 tablet (10 mg total) by mouth daily before breakfast.   diclofenac  Sodium (VOLTAREN ) 1 % GEL Apply 2 g topically 4 (four) times daily as needed (pain).    doxepin  (SINEQUAN ) 25 MG capsule Take 2 capsules (50 mg total) by mouth at bedtime.   DULoxetine  (CYMBALTA ) 60 MG capsule TAKE 1 CAPSULE(60 MG) BY MOUTH AT BEDTIME   EPINEPHrine  0.3 mg/0.3 mL IJ SOAJ injection Inject 0.3 mg into the muscle as needed for anaphylaxis.   ezetimibe  (ZETIA ) 10 MG tablet Take 1 tablet (  10 mg total) by mouth daily.   famotidine  (PEPCID ) 20 MG tablet Take 1 tablet (20 mg total) by mouth 2 (two) times daily.   Flaxseed, Linseed, (FLAXSEED OIL) 1200 MG CAPS Take 1 capsule by mouth daily.   fluticasone (FLONASE) 50 MCG/ACT nasal spray Place into both nostrils.   gabapentin  (NEURONTIN ) 300 MG capsule Take 300 mg by mouth 3 (three) times daily.   Ginkgo Biloba 60 MG TABS Take 1 tablet by mouth daily.   Ginseng 100 MG CAPS Take 100 mg by mouth daily.    glucose blood (ACCU-CHEK GUIDE) test strip Check blood sugar 2 times daily   guaiFENesin -codeine  100-10 MG/5ML syrup Take 10 mLs by mouth every 6 (six) hours as needed for cough.   HUMALOG  KWIKPEN 200 UNIT/ML  KwikPen DIAL  AND INJECT 22 UNITS UNDER THE SKIN THREE TIMES A DAY WITH MEALS PLUS CORRECTION SCALE. MAX DAILY DOSE IS 100 UNITS.   hydrocortisone  (ANUSOL -HC) 25 MG suppository Place 1 suppository (25 mg total) rectally 2 (two) times daily as needed for hemorrhoids.   isosorbide  mononitrate (IMDUR ) 60 MG 24 hr tablet Take 1 tablet (60 mg total) by mouth daily.   LANTUS  100 UNIT/ML injection Inject 0.54 mLs (54 Units total) into the skin daily.   lidocaine  (XYLOCAINE ) 2 % solution Use as directed 15 mLs in the mouth or throat as needed for mouth pain.   metFORMIN  (GLUCOPHAGE -XR) 500 MG 24 hr tablet Take 2 tablets (1,000 mg total) by mouth daily with breakfast.   Minoxidil 5 % FOAM Apply 1 g topically daily.   Misc Natural Products (BEET ROOT) 500 MG CAPS 1 tablet Orally twice a day Daily supplement/general wellness   mometasone  (ELOCON ) 0.1 % ointment Apply topically 2 (two) times daily as needed.   Multiple Vitamins-Minerals (CENTRUM SILVER 50+MEN) TABS Take 1 tablet by mouth daily.   mupirocin  ointment (BACTROBAN ) 2 % Apply 1 Application topically 2 (two) times daily.   NARCAN  4 MG/0.1ML LIQD nasal spray kit Place 0.4 mg into the nose as needed (opioid overdose). Never uses   nitroGLYCERIN  (NITROSTAT ) 0.4 MG SL tablet Place 1 tablet (0.4 mg total) under the tongue every 5 (five) minutes as needed for chest pain.   Omega-3 1000 MG CAPS Take 1,000 mg by mouth daily.    ondansetron  (ZOFRAN ) 4 MG tablet TAKE 1 TABLET(4 MG) BY MOUTH EVERY 8 HOURS AS NEEDED FOR NAUSEA OR VOMITING   [START ON 12/17/2024] oxyCODONE  (ROXICODONE ) 15 MG immediate release tablet Take 1 tablet (15 mg total) by mouth every 4 (four) hours as needed.   [START ON 11/20/2024] oxyCODONE  (ROXICODONE ) 15 MG immediate release tablet Take 1 tablet (15 mg total) by mouth every 4 (four) hours as needed.   oxyCODONE  (ROXICODONE ) 15 MG immediate release tablet Take 1 tablet (15 mg total) by mouth every 4 (four) hours as needed.   pantoprazole   (PROTONIX ) 40 MG tablet TAKE 1 TABLET(40 MG) BY MOUTH TWICE DAILY   polyethylene glycol (MIRALAX  / GLYCOLAX ) 17 g packet Take 17 g by mouth 2 (two) times daily.   sucralfate  (CARAFATE ) 1 g tablet TAKE 1 TABLET BY MOUTH FOUR TIMES DAILY WITH MEALS AND AT BEDTIME   traZODone  (DESYREL ) 100 MG tablet Take 1 tablet (100 mg total) by mouth at bedtime as needed. for sleep   triamcinolone  (KENALOG ) 0.025 % cream Apply 1 Application topically 2 (two) times daily.   vitamin A  10000 UNIT capsule Take 10,000 Units by mouth daily.   vitamin B-12 (CYANOCOBALAMIN ) 1000 MCG tablet  Take 1,000 mcg by mouth daily.   Vitamin E  400 units TABS Take 400 Units by mouth daily.    [DISCONTINUED] albuterol  (PROVENTIL ) (2.5 MG/3ML) 0.083% nebulizer solution Take 3 mLs (2.5 mg total) by nebulization every 6 (six) hours as needed for wheezing or shortness of breath.   [DISCONTINUED] albuterol  (VENTOLIN  HFA) 108 (90 Base) MCG/ACT inhaler Inhale 2 puffs into the lungs every 6 (six) hours as needed for wheezing or shortness of breath.   albuterol  (PROVENTIL ) (2.5 MG/3ML) 0.083% nebulizer solution Take 3 mLs (2.5 mg total) by nebulization every 6 (six) hours as needed for wheezing or shortness of breath.   albuterol  (VENTOLIN  HFA) 108 (90 Base) MCG/ACT inhaler Inhale 2 puffs into the lungs every 6 (six) hours as needed for wheezing or shortness of breath.   budesonide -glycopyrrolate -formoterol  (BREZTRI  AEROSPHERE) 160-9-4.8 MCG/ACT AERO inhaler Inhale 2 puffs into the lungs in the morning and at bedtime.   [DISCONTINUED] budeson-glycopyrrolate -formoterol  (BREZTRI  AEROSPHERE) 160-9-4.8 MCG/ACT AERO Inhale 2 puffs into the lungs in the morning and at bedtime. (Patient not taking: Reported on 11/01/2024)   No facility-administered encounter medications on file as of 11/01/2024.    Allergies as of 11/01/2024 - Review Complete 11/01/2024  Allergen Reaction Noted   Bee pollen Anaphylaxis 08/29/2013   Bee venom Anaphylaxis 02/22/2014    Crestor  [rosuvastatin  calcium ] Shortness Of Breath and Swelling 03/03/2017   Fentanyl  Itching and Hives 03/27/2015   Shellfish allergy Anaphylaxis and Swelling 03/20/2014   Chlorhexidine   11/23/2022   Fire ant  01/05/2022   Furosemide  Nausea And Vomiting 01/01/2020   Buprenorphine hcl Itching 10/10/2015   Chlorhexidine  gluconate Itching and Rash 08/13/2021   Simvastatin Diarrhea 04/25/2015    Past Medical History:  Diagnosis Date   Allergy    Aortic atherosclerosis    Aortic valve sclerosis    a. 08/2021 Echo: EF 60-65%, no rwma, mild LVH, nl RV fxn, mildly dil RA, AoV sclerosis w/o stenosis.   Asthma    C. difficile diarrhea    Chronic pain    Collagenous colitis    Coronary artery disease    a. 08/2019 Cath/PCI: LM 60p/m (FFR 0.87-->2.75 x 18 Resolute Onyx DES). D1/2/3 nl, RI nl, LCX 20p/m, RCA/RPDA nl, RPAV 50. EF 55-65%.   DDD (degenerative disc disease), cervical    DDD (degenerative disc disease), lumbar    GERD (gastroesophageal reflux disease)    Grade I diastolic dysfunction    Hepatic steatosis    Hyperlipidemia    Hypertension    Liver cancer (HCC) 03/2015   Migraines    Myocardial infarction (HCC)     OSA on CPAP    Seizures (HCC)    several as child when sick.  None since age 59   Stroke (HCC)    'mini-stroke 30 yrs ago. no deficits.   T2DM (type 2 diabetes mellitus) (HCC)    Wears dentures    full upper and lower    Past Surgical History:  Procedure Laterality Date   APPENDECTOMY     BACK SURGERY     CARDIAC CATHETERIZATION     No stent placed in his 28's   CATARACT EXTRACTION W/PHACO Left 09/27/2024   Procedure: PHACOEMULSIFICATION, CATARACT, WITH IOL INSERTION 4.76 00:36.4;  Surgeon: Mittie Gaskin, MD;  Location: [facility];  Service: Ophthalmology;  Laterality: Left;   CATARACT EXTRACTION W/PHACO Right 10/25/2024   Procedure: PHACOEMULSIFICATION, CATARACT, WITH IOL INSERTION 3.98 00:20.7;  Surgeon: Mittie Gaskin, MD;   Location: [facility];  Service: Ophthalmology;  Laterality:  Right;   CERVICAL FUSION     COLONOSCOPY WITH PROPOFOL  N/A 03/06/2016   Procedure: COLONOSCOPY WITH PROPOFOL ;  Surgeon: Rogelia Copping, MD;  Location: [facility];  Service: Endoscopy;  Laterality: N/A;  requests early   COLONOSCOPY WITH PROPOFOL  N/A 06/18/2020   Procedure: COLONOSCOPY WITH PROPOFOL ;  Surgeon: Copping Rogelia, MD;  Location: [facility];  Service: Endoscopy;  Laterality: N/A;   CORONARY PRESSURE/FFR STUDY N/A 09/05/2019   Procedure: INTRAVASCULAR PRESSURE WIRE/FFR STUDY;  Surgeon: Mady Bruckner, MD;  Location: [facility];  Service: Cardiovascular;  Laterality: N/A;   ESOPHAGOGASTRODUODENOSCOPY (EGD) WITH PROPOFOL  N/A 09/20/2017   Procedure: ESOPHAGOGASTRODUODENOSCOPY (EGD) WITH PROPOFOL ;  Surgeon: Copping Rogelia, MD;  Location: [facility];  Service: Endoscopy;  Laterality: N/A;  Diabetic - oral meds   FINGER SURGERY Left    KNEE SURGERY Right    LEFT HEART CATH AND CORONARY ANGIOGRAPHY Left 09/05/2019   Procedure: LEFT HEART CATH AND CORONARY ANGIOGRAPHY (2.75 x 18 mm Resolute Onyx DES x 1 to prox/mid LAD);  Surgeon: Mady Bruckner, MD;  Location: [facility];  Service: Cardiovascular;  Laterality: Left;   NECK SURGERY     spleen surgery     TOE SURGERY Right    TOTAL HIP ARTHROPLASTY Right 06/03/2021   Procedure: TOTAL HIP ARTHROPLASTY ANTERIOR APPROACH;  Surgeon: Kathlynn Sharper, MD;  Location: [facility];  Service: Orthopedics;  Laterality: Right;   TOTAL KNEE ARTHROPLASTY Right 08/22/2021   Procedure: TOTAL KNEE ARTHROPLASTY;  Surgeon: Kathlynn Sharper, MD;  Location: [facility];  Service: Orthopedics;  Laterality: Right;    Family History  Problem Relation Age of Onset   Arthritis Mother    Diabetes Mother    Kidney disease Mother    Heart disease Mother    Hypertension Mother    Arthritis Father    Hearing loss Father    Hypertension Father    Heart disease Father     Diabetes Sister    Heart disease Sister    Diabetes Daughter    Diabetes Maternal Aunt    Diabetes Maternal Grandmother    Heart Problems Brother    Heart Problems Brother    Heart Problems Brother    Heart attack Maternal Grandfather    Colon cancer Paternal Grandfather     Social History   Socioeconomic History   Marital status: Married    Spouse name: Not on file   Number of children: Not on file   Years of education: 13.5   Highest education level: Some college, no degree  Occupational History   Occupation: disability   Tobacco Use   Smoking status: Former    Current packs/day: 0.00    Average packs/day: 2.0 packs/day for 50.0 years (100.0 ttl pk-yrs)    Types: Cigarettes    Start date: 17    Quit date: 2011    Years since quitting: 15.0    Passive exposure: Past   Smokeless tobacco: Never  Vaping Use   Vaping status: Never Used  Substance and Sexual Activity   Alcohol use: No    Alcohol/week: 0.0 standard drinks of alcohol   Drug use: Yes    Comment: prescribed narcotics   Sexual activity: Not Currently  Other Topics Concern   Not on file  Social History Narrative   Right handed    Lives with wife   Social Drivers of Health   Tobacco Use: Medium Risk (11/01/2024)   Patient History    Smoking Tobacco Use: Former  Smokeless Tobacco Use: Never    Passive Exposure: Past  Financial Resource Strain: Low Risk (07/08/2022)   Overall Financial Resource Strain (CARDIA)    Difficulty of Paying Living Expenses: Not hard at all  Food Insecurity: No Food Insecurity (10/18/2024)   Epic    Worried About Programme Researcher, Broadcasting/film/video in the Last Year: Never true    Ran Out of Food in the Last Year: Never true  Transportation Needs: No Transportation Needs (10/18/2024)   Epic    Lack of Transportation (Medical): No    Lack of Transportation (Non-Medical): No  Physical Activity: Inactive (08/29/2024)   Exercise Vital Sign    Days of Exercise per Week: 0 days    Minutes of  Exercise per Session: Patient declined  Stress: No Stress Concern Present (08/29/2024)   [employer] of Occupational Health - Occupational Stress Questionnaire    Feeling of Stress: Not at all  Social Connections: Moderately Integrated (10/18/2024)   Social Connection and Isolation Panel    Frequency of Communication with Friends and Family: More than three times a week    Frequency of Social Gatherings with Friends and Family: Never    Attends Religious Services: More than 4 times per year    Active Member of [employer] or Organizations: No    Attends Banker Meetings: Never    Marital Status: Married  Recent Concern: Social Connections - Moderately Isolated (08/29/2024)   Social Connection and Isolation Panel    Frequency of Communication with Friends and Family: Once a week    Frequency of Social Gatherings with Friends and Family: Never    Attends Religious Services: More than 4 times per year    Active Member of Clubs or Organizations: No    Attends Banker Meetings: Never    Marital Status: Married  Catering Manager Violence: Not At Risk (10/18/2024)   Epic    Fear of Current or Ex-Partner: No    Emotionally Abused: No    Physically Abused: No    Sexually Abused: No  Depression (PHQ2-9): Medium Risk (10/23/2024)   Depression (PHQ2-9)    PHQ-2 Score: 10  Alcohol Screen: Low Risk (07/08/2022)   Alcohol Screen    Last Alcohol Screening Score (AUDIT): 0  Housing: Low Risk (10/18/2024)   Epic    Unable to Pay for Housing in the Last Year: No    Number of Times Moved in the Last Year: 0    Homeless in the Last Year: No  Utilities: Not At Risk (10/18/2024)   Epic    Threatened with loss of utilities: No  Health Literacy: Adequate Health Literacy (08/29/2024)   B1300 Health Literacy    Frequency of need for help with medical instructions: Never    Review of Systems  Constitutional:  Negative for fatigue.  Respiratory:  Positive for apnea and  shortness of breath.   Psychiatric/Behavioral:  Positive for sleep disturbance.     Vitals:   11/01/24 1320  BP: 138/80  Pulse: 74  Temp: 98 F (36.7 C)  SpO2: 94%    Physical Exam Constitutional:      Appearance: He is obese.  HENT:     Head: Normocephalic.     Mouth/Throat:     Mouth: Mucous membranes are moist.  Eyes:     General: No scleral icterus. Cardiovascular:     Rate and Rhythm: Normal rate and regular rhythm.     Pulses: Normal pulses.     Heart sounds: Normal  heart sounds. No murmur heard.    No friction rub.  Pulmonary:     Effort: Pulmonary effort is normal. No respiratory distress.     Breath sounds: No stridor. No wheezing or rhonchi.     Comments: Decreased air movement bilaterally Musculoskeletal:        General: No swelling.     Cervical back: No rigidity or tenderness.  Neurological:     General: No focal deficit present.     Mental Status: He is alert.  Psychiatric:        Mood and Affect: Mood normal.    Data Reviewed: Spirometry recently did reveal no significant obstruction Echocardiogram 12/14/2019-diastolic dysfunction  CT scan of the chest 11/08/2019-no significant abnormality  Compliance data not available  On BiPAP of 22/18 and claims good compliance  Assessment:   Chronic obstructive pulmonary disease - Continue Breztri  - Continue albuterol  as needed - Will place an order for nebulizer unit to use albuterol  as needed up to 4 times a day  Obstructive sleep apnea - Will continue BiPAP 22/18 - Describes improved sleep and continues to benefit from BiPAP use  Musculoskeletal pain and discomfort - Multifactorial Does have a history of rheumatoid arthritis as well  Plan/Recommendations:  Continue Breztri   Continue albuterol   Albuterol  nebulization as needed  Continue weight loss efforts  Call with significant concerns  Follow-up in about 4 months  Has not had a PFT in a while but feels he may not be able to do it  at present May need once at some point  I spent 30 minutes dedicated to the care of this patient on the date of this encounter to include previsit review of records, face-to-face time with the patient discussing conditions above, post visit ordering of testing,ordering medications,independentlyinterpreting results, clinical documentation with electronic health record   Jennet Epley MD [facility] 11/01/2024, 1:43 PM  CC: Ziglar, Devere POUR, MD   "

## 2024-11-01 NOTE — Patient Instructions (Signed)
 Will see you back in about 4 months  Prescription for albuterol  to go in your nebulizer was sent to pharmacy Albuterol  inhaler prescription sent to pharmacy  Prescription for Breztri  sent to pharmacy  Continue using your BiPAP

## 2024-11-09 ENCOUNTER — Telehealth: Payer: Self-pay | Admitting: Family Medicine

## 2024-11-09 ENCOUNTER — Other Ambulatory Visit: Payer: Self-pay | Admitting: Family Medicine

## 2024-11-09 NOTE — Telephone Encounter (Signed)
 Patient walked in requesting a referral for  [facility]. Dr. Arlyss in [facility] ph # [phone].

## 2024-11-10 NOTE — Telephone Encounter (Signed)
 Called [facility] but they are closed Fridays.

## 2024-11-13 ENCOUNTER — Other Ambulatory Visit: Payer: Self-pay

## 2024-11-13 DIAGNOSIS — L281 Prurigo nodularis: Secondary | ICD-10-CM

## 2024-11-21 ENCOUNTER — Other Ambulatory Visit: Payer: Self-pay

## 2024-11-21 MED ORDER — HUMALOG KWIKPEN 200 UNIT/ML ~~LOC~~ SOPN: 22.0000 [IU] | PEN_INJECTOR | Freq: Three times a day (TID) | SUBCUTANEOUS | 0 refills | Status: AC | PRN

## 2024-11-21 MED ORDER — COMFORT EZ PEN NEEDLES 32G X 4 MM MISC: 2 refills | Status: AC

## 2025-01-22 ENCOUNTER — Ambulatory Visit: Admitting: Family Medicine

## 2025-02-15 ENCOUNTER — Ambulatory Visit: Admitting: Internal Medicine

## 2025-03-13 ENCOUNTER — Ambulatory Visit: Admitting: Pulmonary Disease
